# Patient Record
Sex: Female | Born: 1950 | Race: Black or African American | Hispanic: No | Smoking: Former smoker
Health system: Southern US, Community
[De-identification: ages and names within clinical notes are randomized; demographics above are authoritative.]

## PROBLEM LIST (undated history)

## (undated) DIAGNOSIS — I4719 Other supraventricular tachycardia: Secondary | ICD-10-CM

## (undated) DIAGNOSIS — F419 Anxiety disorder, unspecified: Secondary | ICD-10-CM

## (undated) DIAGNOSIS — I471 Supraventricular tachycardia: Secondary | ICD-10-CM

## (undated) DIAGNOSIS — K5732 Diverticulitis of large intestine without perforation or abscess without bleeding: Secondary | ICD-10-CM

## (undated) DIAGNOSIS — E785 Hyperlipidemia, unspecified: Secondary | ICD-10-CM

## (undated) DIAGNOSIS — E039 Hypothyroidism, unspecified: Secondary | ICD-10-CM

## (undated) DIAGNOSIS — Z992 Dependence on renal dialysis: Secondary | ICD-10-CM

## (undated) DIAGNOSIS — I499 Cardiac arrhythmia, unspecified: Secondary | ICD-10-CM

## (undated) DIAGNOSIS — K56609 Unspecified intestinal obstruction, unspecified as to partial versus complete obstruction: Secondary | ICD-10-CM

## (undated) DIAGNOSIS — IMO0001 Reserved for inherently not codable concepts without codable children: Secondary | ICD-10-CM

## (undated) DIAGNOSIS — D649 Anemia, unspecified: Secondary | ICD-10-CM

## (undated) DIAGNOSIS — N051 Unspecified nephritic syndrome with focal and segmental glomerular lesions: Secondary | ICD-10-CM

## (undated) DIAGNOSIS — K922 Gastrointestinal hemorrhage, unspecified: Secondary | ICD-10-CM

## (undated) DIAGNOSIS — E669 Obesity, unspecified: Secondary | ICD-10-CM

## (undated) DIAGNOSIS — E538 Deficiency of other specified B group vitamins: Secondary | ICD-10-CM

## (undated) DIAGNOSIS — G629 Polyneuropathy, unspecified: Secondary | ICD-10-CM

## (undated) DIAGNOSIS — G4730 Sleep apnea, unspecified: Secondary | ICD-10-CM

## (undated) DIAGNOSIS — R42 Dizziness and giddiness: Secondary | ICD-10-CM

## (undated) DIAGNOSIS — E1140 Type 2 diabetes mellitus with diabetic neuropathy, unspecified: Secondary | ICD-10-CM

## (undated) DIAGNOSIS — R600 Localized edema: Secondary | ICD-10-CM

## (undated) DIAGNOSIS — M1990 Unspecified osteoarthritis, unspecified site: Secondary | ICD-10-CM

## (undated) DIAGNOSIS — E119 Type 2 diabetes mellitus without complications: Secondary | ICD-10-CM

## (undated) DIAGNOSIS — K2970 Gastritis, unspecified, without bleeding: Secondary | ICD-10-CM

## (undated) DIAGNOSIS — Z933 Colostomy status: Secondary | ICD-10-CM

## (undated) DIAGNOSIS — I350 Nonrheumatic aortic (valve) stenosis: Secondary | ICD-10-CM

## (undated) DIAGNOSIS — N189 Chronic kidney disease, unspecified: Secondary | ICD-10-CM

## (undated) DIAGNOSIS — I10 Essential (primary) hypertension: Secondary | ICD-10-CM

## (undated) DIAGNOSIS — L409 Psoriasis, unspecified: Secondary | ICD-10-CM

## (undated) DIAGNOSIS — I509 Heart failure, unspecified: Secondary | ICD-10-CM

## (undated) DIAGNOSIS — R011 Cardiac murmur, unspecified: Secondary | ICD-10-CM

## (undated) DIAGNOSIS — A4902 Methicillin resistant Staphylococcus aureus infection, unspecified site: Secondary | ICD-10-CM

## (undated) HISTORY — PX: CARDIAC CATHETERIZATION: SHX172

## (undated) HISTORY — DX: Type 2 diabetes mellitus without complications: E11.9

## (undated) HISTORY — DX: Nonrheumatic aortic (valve) stenosis: I35.0

## (undated) HISTORY — DX: Diverticulitis of large intestine without perforation or abscess without bleeding: K57.32

## (undated) HISTORY — DX: Cardiac arrhythmia, unspecified: I49.9

## (undated) HISTORY — PX: CHOLECYSTECTOMY: SHX55

## (undated) HISTORY — DX: Hyperlipidemia, unspecified: E78.5

## (undated) HISTORY — DX: Heart failure, unspecified: I50.9

## (undated) HISTORY — PX: ABDOMINAL HYSTERECTOMY: SHX81

## (undated) HISTORY — DX: Gastrointestinal hemorrhage, unspecified: K92.2

## (undated) HISTORY — PX: HERNIA REPAIR: SHX51

## (undated) HISTORY — PX: COLECTOMY: SHX59

## (undated) HISTORY — PX: BACK SURGERY: SHX140

## (undated) HISTORY — DX: Essential (primary) hypertension: I10

## (undated) HISTORY — PX: TONSILLECTOMY: SUR1361

## (undated) HISTORY — DX: Obesity, unspecified: E66.9

---

## 2001-12-28 ENCOUNTER — Encounter: Payer: Self-pay | Admitting: Neurosurgery

## 2001-12-30 ENCOUNTER — Encounter: Payer: Self-pay | Admitting: Neurosurgery

## 2001-12-30 ENCOUNTER — Inpatient Hospital Stay (HOSPITAL_COMMUNITY): Admission: RE | Admit: 2001-12-30 | Discharge: 2001-12-31 | Payer: Self-pay | Admitting: Neurosurgery

## 2003-11-21 ENCOUNTER — Ambulatory Visit: Payer: Self-pay | Admitting: Nurse Practitioner

## 2004-04-03 ENCOUNTER — Emergency Department: Payer: Self-pay | Admitting: Emergency Medicine

## 2004-10-19 ENCOUNTER — Ambulatory Visit: Payer: Self-pay | Admitting: Internal Medicine

## 2005-01-21 ENCOUNTER — Emergency Department: Payer: Self-pay | Admitting: Unknown Physician Specialty

## 2006-07-27 ENCOUNTER — Emergency Department: Payer: Self-pay | Admitting: Unknown Physician Specialty

## 2007-04-28 ENCOUNTER — Emergency Department: Payer: Self-pay | Admitting: Emergency Medicine

## 2007-04-28 ENCOUNTER — Other Ambulatory Visit: Payer: Self-pay

## 2007-07-05 ENCOUNTER — Other Ambulatory Visit: Payer: Self-pay

## 2007-07-05 ENCOUNTER — Emergency Department: Payer: Self-pay | Admitting: Emergency Medicine

## 2007-07-11 ENCOUNTER — Emergency Department: Payer: Self-pay | Admitting: Emergency Medicine

## 2007-07-11 ENCOUNTER — Other Ambulatory Visit: Payer: Self-pay

## 2007-09-04 ENCOUNTER — Ambulatory Visit: Payer: Self-pay | Admitting: Internal Medicine

## 2008-03-07 ENCOUNTER — Emergency Department: Payer: Self-pay

## 2008-07-19 ENCOUNTER — Encounter: Payer: Self-pay | Admitting: Internal Medicine

## 2008-08-13 ENCOUNTER — Emergency Department: Payer: Self-pay | Admitting: Emergency Medicine

## 2009-04-11 ENCOUNTER — Ambulatory Visit: Payer: Self-pay | Admitting: Unknown Physician Specialty

## 2009-07-27 ENCOUNTER — Ambulatory Visit: Payer: Self-pay | Admitting: Internal Medicine

## 2010-10-15 DIAGNOSIS — R809 Proteinuria, unspecified: Secondary | ICD-10-CM | POA: Insufficient documentation

## 2010-11-03 ENCOUNTER — Emergency Department: Payer: Self-pay | Admitting: Emergency Medicine

## 2010-12-11 ENCOUNTER — Ambulatory Visit: Payer: Self-pay | Admitting: Otolaryngology

## 2010-12-27 ENCOUNTER — Ambulatory Visit: Payer: Self-pay | Admitting: Otolaryngology

## 2011-02-14 ENCOUNTER — Inpatient Hospital Stay: Payer: Self-pay | Admitting: Surgery

## 2011-07-16 ENCOUNTER — Ambulatory Visit: Payer: Self-pay | Admitting: Internal Medicine

## 2011-11-09 ENCOUNTER — Emergency Department: Payer: Self-pay | Admitting: Emergency Medicine

## 2012-07-17 ENCOUNTER — Ambulatory Visit: Payer: Self-pay | Admitting: Internal Medicine

## 2013-04-05 ENCOUNTER — Ambulatory Visit: Payer: Self-pay | Admitting: Unknown Physician Specialty

## 2013-04-19 ENCOUNTER — Other Ambulatory Visit: Payer: Self-pay | Admitting: Unknown Physician Specialty

## 2013-04-19 DIAGNOSIS — Z800 Family history of malignant neoplasm of digestive organs: Secondary | ICD-10-CM

## 2013-04-28 ENCOUNTER — Emergency Department: Payer: Self-pay | Admitting: Emergency Medicine

## 2013-04-28 LAB — URINALYSIS, COMPLETE
Bacteria: NONE SEEN
Bilirubin,UR: NEGATIVE
Glucose,UR: 50 mg/dL (ref 0–75)
Hyaline Cast: 1
Ketone: NEGATIVE
Leukocyte Esterase: NEGATIVE
Nitrite: NEGATIVE
Ph: 5 (ref 4.5–8.0)
Protein: 100
RBC,UR: 6 /HPF (ref 0–5)
Specific Gravity: 1.009 (ref 1.003–1.030)
Squamous Epithelial: 1
WBC UR: NONE SEEN /HPF (ref 0–5)

## 2013-04-28 LAB — COMPREHENSIVE METABOLIC PANEL
Albumin: 3 g/dL — ABNORMAL LOW (ref 3.4–5.0)
Alkaline Phosphatase: 118 U/L — ABNORMAL HIGH
Anion Gap: 4 — ABNORMAL LOW (ref 7–16)
BUN: 38 mg/dL — ABNORMAL HIGH (ref 7–18)
Bilirubin,Total: 0.2 mg/dL (ref 0.2–1.0)
Calcium, Total: 8.4 mg/dL — ABNORMAL LOW (ref 8.5–10.1)
Chloride: 102 mmol/L (ref 98–107)
Co2: 26 mmol/L (ref 21–32)
Creatinine: 1.92 mg/dL — ABNORMAL HIGH (ref 0.60–1.30)
EGFR (African American): 32 — ABNORMAL LOW
EGFR (Non-African Amer.): 27 — ABNORMAL LOW
Glucose: 235 mg/dL — ABNORMAL HIGH (ref 65–99)
Osmolality: 281 (ref 275–301)
Potassium: 4.7 mmol/L (ref 3.5–5.1)
SGOT(AST): 14 U/L — ABNORMAL LOW (ref 15–37)
SGPT (ALT): 15 U/L (ref 12–78)
Sodium: 132 mmol/L — ABNORMAL LOW (ref 136–145)
Total Protein: 7.3 g/dL (ref 6.4–8.2)

## 2013-04-28 LAB — CBC
HCT: 36.5 % (ref 35.0–47.0)
HGB: 11.7 g/dL — ABNORMAL LOW (ref 12.0–16.0)
MCH: 26.6 pg (ref 26.0–34.0)
MCHC: 32.2 g/dL (ref 32.0–36.0)
MCV: 83 fL (ref 80–100)
Platelet: 277 10*3/uL (ref 150–440)
RBC: 4.41 10*6/uL (ref 3.80–5.20)
RDW: 15 % — ABNORMAL HIGH (ref 11.5–14.5)
WBC: 11.6 10*3/uL — ABNORMAL HIGH (ref 3.6–11.0)

## 2013-04-28 LAB — LIPASE, BLOOD: Lipase: 158 U/L (ref 73–393)

## 2013-05-03 LAB — URINALYSIS, COMPLETE
BACTERIA: NONE SEEN
Bilirubin,UR: NEGATIVE
Glucose,UR: 50 mg/dL (ref 0–75)
KETONE: NEGATIVE
NITRITE: NEGATIVE
Ph: 5 (ref 4.5–8.0)
Protein: >=500
RBC,UR: 8 /HPF (ref 0–5)
SPECIFIC GRAVITY: 1.009 (ref 1.003–1.030)
WBC UR: 8 /HPF (ref 0–5)

## 2013-05-03 LAB — COMPREHENSIVE METABOLIC PANEL
AST: 21 U/L (ref 15–37)
Albumin: 2.9 g/dL — ABNORMAL LOW (ref 3.4–5.0)
Alkaline Phosphatase: 104 U/L
Anion Gap: 6 — ABNORMAL LOW (ref 7–16)
BUN: 16 mg/dL (ref 7–18)
Bilirubin,Total: 0.2 mg/dL (ref 0.2–1.0)
CHLORIDE: 107 mmol/L (ref 98–107)
Calcium, Total: 8.1 mg/dL — ABNORMAL LOW (ref 8.5–10.1)
Co2: 24 mmol/L (ref 21–32)
Creatinine: 1.7 mg/dL — ABNORMAL HIGH (ref 0.60–1.30)
EGFR (Non-African Amer.): 32 — ABNORMAL LOW
GFR CALC AF AMER: 37 — AB
GLUCOSE: 157 mg/dL — AB (ref 65–99)
OSMOLALITY: 278 (ref 275–301)
Potassium: 3.8 mmol/L (ref 3.5–5.1)
SGPT (ALT): 22 U/L (ref 12–78)
Sodium: 137 mmol/L (ref 136–145)
TOTAL PROTEIN: 7.3 g/dL (ref 6.4–8.2)

## 2013-05-03 LAB — CBC WITH DIFFERENTIAL/PLATELET
Basophil #: 0.1 10*3/uL (ref 0.0–0.1)
Basophil %: 0.8 %
Eosinophil #: 0.1 10*3/uL (ref 0.0–0.7)
Eosinophil %: 1.4 %
HCT: 37.1 % (ref 35.0–47.0)
HGB: 11.6 g/dL — ABNORMAL LOW (ref 12.0–16.0)
Lymphocyte #: 1.6 10*3/uL (ref 1.0–3.6)
Lymphocyte %: 17 %
MCH: 26.2 pg (ref 26.0–34.0)
MCHC: 31.3 g/dL — ABNORMAL LOW (ref 32.0–36.0)
MCV: 84 fL (ref 80–100)
Monocyte #: 0.7 x10 3/mm (ref 0.2–0.9)
Monocyte %: 7.8 %
Neutrophil #: 6.9 10*3/uL — ABNORMAL HIGH (ref 1.4–6.5)
Neutrophil %: 73 %
Platelet: 301 10*3/uL (ref 150–440)
RBC: 4.42 10*6/uL (ref 3.80–5.20)
RDW: 14.7 % — ABNORMAL HIGH (ref 11.5–14.5)
WBC: 9.4 10*3/uL (ref 3.6–11.0)

## 2013-05-03 LAB — LIPASE, BLOOD: LIPASE: 133 U/L (ref 73–393)

## 2013-05-03 LAB — TROPONIN I: Troponin-I: <0.02 ng/mL

## 2013-05-04 ENCOUNTER — Other Ambulatory Visit: Payer: Self-pay

## 2013-05-04 LAB — SEDIMENTATION RATE: ERYTHROCYTE SED RATE: 46 mm/h — AB (ref 0–30)

## 2013-05-04 LAB — TSH: THYROID STIMULATING HORM: 7.31 u[IU]/mL — AB

## 2013-05-05 ENCOUNTER — Inpatient Hospital Stay: Payer: Self-pay | Admitting: Internal Medicine

## 2013-05-05 LAB — BASIC METABOLIC PANEL
Anion Gap: 4 — ABNORMAL LOW (ref 7–16)
BUN: 16 mg/dL (ref 7–18)
CO2: 26 mmol/L (ref 21–32)
Calcium, Total: 7.7 mg/dL — ABNORMAL LOW (ref 8.5–10.1)
Chloride: 107 mmol/L (ref 98–107)
Creatinine: 1.9 mg/dL — ABNORMAL HIGH (ref 0.60–1.30)
EGFR (Non-African Amer.): 28 — ABNORMAL LOW
GFR CALC AF AMER: 32 — AB
GLUCOSE: 89 mg/dL (ref 65–99)
Osmolality: 274 (ref 275–301)
Potassium: 4.4 mmol/L (ref 3.5–5.1)
SODIUM: 137 mmol/L (ref 136–145)

## 2013-05-05 LAB — CBC WITH DIFFERENTIAL/PLATELET
Basophil #: 0 10*3/uL (ref 0.0–0.1)
Basophil %: 0.5 %
Eosinophil #: 0.2 10*3/uL (ref 0.0–0.7)
Eosinophil %: 2.1 %
HCT: 31.8 % — ABNORMAL LOW (ref 35.0–47.0)
HGB: 10.1 g/dL — ABNORMAL LOW (ref 12.0–16.0)
Lymphocyte #: 1.6 10*3/uL (ref 1.0–3.6)
Lymphocyte %: 21.3 %
MCH: 26.3 pg (ref 26.0–34.0)
MCHC: 31.8 g/dL — ABNORMAL LOW (ref 32.0–36.0)
MCV: 83 fL (ref 80–100)
Monocyte #: 0.7 x10 3/mm (ref 0.2–0.9)
Monocyte %: 9.5 %
NEUTROS PCT: 66.6 %
Neutrophil #: 4.9 10*3/uL (ref 1.4–6.5)
Platelet: 259 10*3/uL (ref 150–440)
RBC: 3.84 10*6/uL (ref 3.80–5.20)
RDW: 14.4 % (ref 11.5–14.5)
WBC: 7.3 10*3/uL (ref 3.6–11.0)

## 2013-05-06 LAB — CREATININE, SERUM
Creatinine: 2.19 mg/dL — ABNORMAL HIGH (ref 0.60–1.30)
EGFR (African American): 27 — ABNORMAL LOW
EGFR (Non-African Amer.): 23 — ABNORMAL LOW

## 2013-05-06 LAB — HEMATOCRIT: HCT: 31.8 % — AB (ref 35.0–47.0)

## 2013-05-07 ENCOUNTER — Other Ambulatory Visit: Payer: Self-pay | Admitting: Unknown Physician Specialty

## 2013-05-07 DIAGNOSIS — Q438 Other specified congenital malformations of intestine: Secondary | ICD-10-CM

## 2013-05-07 LAB — BASIC METABOLIC PANEL
Anion Gap: 4 — ABNORMAL LOW (ref 7–16)
BUN: 15 mg/dL (ref 7–18)
CO2: 27 mmol/L (ref 21–32)
Calcium, Total: 8 mg/dL — ABNORMAL LOW (ref 8.5–10.1)
Chloride: 109 mmol/L — ABNORMAL HIGH (ref 98–107)
Creatinine: 2.03 mg/dL — ABNORMAL HIGH (ref 0.60–1.30)
EGFR (African American): 29 — ABNORMAL LOW
GFR CALC NON AF AMER: 25 — AB
Glucose: 87 mg/dL (ref 65–99)
Osmolality: 280 (ref 275–301)
Potassium: 4.3 mmol/L (ref 3.5–5.1)
Sodium: 140 mmol/L (ref 136–145)

## 2013-05-08 LAB — URINALYSIS, COMPLETE
BILIRUBIN, UR: NEGATIVE
Glucose,UR: 50 mg/dL (ref 0–75)
Ketone: NEGATIVE
NITRITE: NEGATIVE
PH: 6 (ref 4.5–8.0)
Specific Gravity: 1.006 (ref 1.003–1.030)
Squamous Epithelial: 2

## 2013-05-08 LAB — CBC WITH DIFFERENTIAL/PLATELET
BASOS PCT: 0.6 %
Basophil #: 0.1 10*3/uL (ref 0.0–0.1)
EOS ABS: 0.1 10*3/uL (ref 0.0–0.7)
Eosinophil %: 1.2 %
HCT: 34.6 % — ABNORMAL LOW (ref 35.0–47.0)
HGB: 11.5 g/dL — ABNORMAL LOW (ref 12.0–16.0)
LYMPHS PCT: 16.6 %
Lymphocyte #: 1.3 10*3/uL (ref 1.0–3.6)
MCH: 27.6 pg (ref 26.0–34.0)
MCHC: 33.2 g/dL (ref 32.0–36.0)
MCV: 83 fL (ref 80–100)
MONO ABS: 0.4 x10 3/mm (ref 0.2–0.9)
Monocyte %: 4.7 %
Neutrophil #: 6.3 10*3/uL (ref 1.4–6.5)
Neutrophil %: 76.9 %
PLATELETS: 318 10*3/uL (ref 150–440)
RBC: 4.18 10*6/uL (ref 3.80–5.20)
RDW: 14.8 % — ABNORMAL HIGH (ref 11.5–14.5)
WBC: 8.1 10*3/uL (ref 3.6–11.0)

## 2013-05-08 LAB — COMPREHENSIVE METABOLIC PANEL
ALK PHOS: 111 U/L
Albumin: 2.7 g/dL — ABNORMAL LOW (ref 3.4–5.0)
Anion Gap: 5 — ABNORMAL LOW (ref 7–16)
BUN: 16 mg/dL (ref 7–18)
Bilirubin,Total: 0.3 mg/dL (ref 0.2–1.0)
CREATININE: 1.91 mg/dL — AB (ref 0.60–1.30)
Calcium, Total: 8.5 mg/dL (ref 8.5–10.1)
Chloride: 107 mmol/L (ref 98–107)
Co2: 28 mmol/L (ref 21–32)
EGFR (African American): 32 — ABNORMAL LOW
EGFR (Non-African Amer.): 27 — ABNORMAL LOW
Glucose: 187 mg/dL — ABNORMAL HIGH (ref 65–99)
Osmolality: 286 (ref 275–301)
POTASSIUM: 4 mmol/L (ref 3.5–5.1)
SGOT(AST): 13 U/L — ABNORMAL LOW (ref 15–37)
SGPT (ALT): 20 U/L (ref 12–78)
Sodium: 140 mmol/L (ref 136–145)
TOTAL PROTEIN: 7.2 g/dL (ref 6.4–8.2)

## 2013-05-09 ENCOUNTER — Inpatient Hospital Stay: Payer: Self-pay | Admitting: Surgery

## 2013-05-09 HISTORY — PX: OTHER SURGICAL HISTORY: SHX169

## 2013-05-09 LAB — CBC WITH DIFFERENTIAL/PLATELET
Basophil #: 0 10*3/uL (ref 0.0–0.1)
Basophil %: 0.3 %
EOS PCT: 0 %
Eosinophil #: 0 10*3/uL (ref 0.0–0.7)
HCT: 32.3 % — ABNORMAL LOW (ref 35.0–47.0)
HGB: 9.8 g/dL — AB (ref 12.0–16.0)
Lymphocyte #: 1.1 10*3/uL (ref 1.0–3.6)
Lymphocyte %: 8 %
MCH: 25.8 pg — ABNORMAL LOW (ref 26.0–34.0)
MCHC: 30.5 g/dL — ABNORMAL LOW (ref 32.0–36.0)
MCV: 85 fL (ref 80–100)
MONO ABS: 1.1 x10 3/mm — AB (ref 0.2–0.9)
Monocyte %: 8.1 %
Neutrophil #: 11.1 10*3/uL — ABNORMAL HIGH (ref 1.4–6.5)
Neutrophil %: 83.6 %
PLATELETS: 317 10*3/uL (ref 150–440)
RBC: 3.82 10*6/uL (ref 3.80–5.20)
RDW: 15.3 % — ABNORMAL HIGH (ref 11.5–14.5)
WBC: 13.3 10*3/uL — ABNORMAL HIGH (ref 3.6–11.0)

## 2013-05-09 LAB — BASIC METABOLIC PANEL
ANION GAP: 2 — AB (ref 7–16)
BUN: 18 mg/dL (ref 7–18)
Calcium, Total: 7.1 mg/dL — ABNORMAL LOW (ref 8.5–10.1)
Chloride: 111 mmol/L — ABNORMAL HIGH (ref 98–107)
Co2: 26 mmol/L (ref 21–32)
Creatinine: 2.17 mg/dL — ABNORMAL HIGH (ref 0.60–1.30)
EGFR (African American): 27 — ABNORMAL LOW
EGFR (Non-African Amer.): 23 — ABNORMAL LOW
Glucose: 156 mg/dL — ABNORMAL HIGH (ref 65–99)
Osmolality: 283 (ref 275–301)
Potassium: 5.3 mmol/L — ABNORMAL HIGH (ref 3.5–5.1)
Sodium: 139 mmol/L (ref 136–145)

## 2013-05-10 LAB — BASIC METABOLIC PANEL
ANION GAP: 2 — AB (ref 7–16)
BUN: 20 mg/dL — ABNORMAL HIGH (ref 7–18)
CO2: 25 mmol/L (ref 21–32)
Calcium, Total: 6.7 mg/dL — CL (ref 8.5–10.1)
Chloride: 112 mmol/L — ABNORMAL HIGH (ref 98–107)
Creatinine: 2.4 mg/dL — ABNORMAL HIGH (ref 0.60–1.30)
EGFR (African American): 24 — ABNORMAL LOW
GFR CALC NON AF AMER: 21 — AB
Glucose: 129 mg/dL — ABNORMAL HIGH (ref 65–99)
Osmolality: 282 (ref 275–301)
POTASSIUM: 5 mmol/L (ref 3.5–5.1)
SODIUM: 139 mmol/L (ref 136–145)

## 2013-05-10 LAB — CBC WITH DIFFERENTIAL/PLATELET
BASOS PCT: 0.5 %
Basophil #: 0.1 10*3/uL (ref 0.0–0.1)
Eosinophil #: 0.1 10*3/uL (ref 0.0–0.7)
Eosinophil %: 0.7 %
HCT: 27.2 % — AB (ref 35.0–47.0)
HGB: 8.5 g/dL — ABNORMAL LOW (ref 12.0–16.0)
Lymphocyte #: 1.1 10*3/uL (ref 1.0–3.6)
Lymphocyte %: 9.5 %
MCH: 26.4 pg (ref 26.0–34.0)
MCHC: 31.4 g/dL — AB (ref 32.0–36.0)
MCV: 84 fL (ref 80–100)
MONO ABS: 1.1 x10 3/mm — AB (ref 0.2–0.9)
Monocyte %: 9.9 %
NEUTROS ABS: 8.8 10*3/uL — AB (ref 1.4–6.5)
Neutrophil %: 79.4 %
Platelet: 276 10*3/uL (ref 150–440)
RBC: 3.23 10*6/uL — ABNORMAL LOW (ref 3.80–5.20)
RDW: 15.2 % — ABNORMAL HIGH (ref 11.5–14.5)
WBC: 11.1 10*3/uL — ABNORMAL HIGH (ref 3.6–11.0)

## 2013-05-11 LAB — BASIC METABOLIC PANEL
Anion Gap: 6 — ABNORMAL LOW (ref 7–16)
BUN: 21 mg/dL — ABNORMAL HIGH (ref 7–18)
CHLORIDE: 111 mmol/L — AB (ref 98–107)
CO2: 23 mmol/L (ref 21–32)
CREATININE: 2.32 mg/dL — AB (ref 0.60–1.30)
Calcium, Total: 6.7 mg/dL — CL (ref 8.5–10.1)
EGFR (African American): 25 — ABNORMAL LOW
EGFR (Non-African Amer.): 22 — ABNORMAL LOW
Glucose: 72 mg/dL (ref 65–99)
Osmolality: 281 (ref 275–301)
Potassium: 4.6 mmol/L (ref 3.5–5.1)
Sodium: 140 mmol/L (ref 136–145)

## 2013-05-11 LAB — CBC WITH DIFFERENTIAL/PLATELET
Basophil #: 0 10*3/uL (ref 0.0–0.1)
Basophil %: 0.2 %
EOS ABS: 0.4 10*3/uL (ref 0.0–0.7)
EOS PCT: 3.3 %
HCT: 25.6 % — ABNORMAL LOW (ref 35.0–47.0)
HGB: 8.1 g/dL — ABNORMAL LOW (ref 12.0–16.0)
Lymphocyte #: 1.1 10*3/uL (ref 1.0–3.6)
Lymphocyte %: 8.6 %
MCH: 26.7 pg (ref 26.0–34.0)
MCHC: 31.9 g/dL — AB (ref 32.0–36.0)
MCV: 84 fL (ref 80–100)
Monocyte #: 1 x10 3/mm — ABNORMAL HIGH (ref 0.2–0.9)
Monocyte %: 8.1 %
NEUTROS ABS: 10 10*3/uL — AB (ref 1.4–6.5)
NEUTROS PCT: 79.8 %
Platelet: 273 10*3/uL (ref 150–440)
RBC: 3.05 10*6/uL — ABNORMAL LOW (ref 3.80–5.20)
RDW: 14.9 % — ABNORMAL HIGH (ref 11.5–14.5)
WBC: 12.5 10*3/uL — ABNORMAL HIGH (ref 3.6–11.0)

## 2013-05-11 LAB — ALBUMIN: ALBUMIN: 1.5 g/dL — AB (ref 3.4–5.0)

## 2013-05-12 ENCOUNTER — Inpatient Hospital Stay: Admission: RE | Admit: 2013-05-12 | Payer: Self-pay | Source: Ambulatory Visit

## 2013-05-12 LAB — CBC WITH DIFFERENTIAL/PLATELET
Basophil #: 0 10*3/uL (ref 0.0–0.1)
Basophil %: 0.3 %
Eosinophil #: 0.5 10*3/uL (ref 0.0–0.7)
Eosinophil %: 3.5 %
HCT: 23.1 % — AB (ref 35.0–47.0)
HGB: 7.3 g/dL — AB (ref 12.0–16.0)
LYMPHS PCT: 9.7 %
Lymphocyte #: 1.2 10*3/uL (ref 1.0–3.6)
MCH: 26.3 pg (ref 26.0–34.0)
MCHC: 31.5 g/dL — ABNORMAL LOW (ref 32.0–36.0)
MCV: 84 fL (ref 80–100)
MONO ABS: 1.1 x10 3/mm — AB (ref 0.2–0.9)
Monocyte %: 8.6 %
NEUTROS ABS: 10 10*3/uL — AB (ref 1.4–6.5)
Neutrophil %: 77.9 %
PLATELETS: 305 10*3/uL (ref 150–440)
RBC: 2.76 10*6/uL — AB (ref 3.80–5.20)
RDW: 14.9 % — ABNORMAL HIGH (ref 11.5–14.5)
WBC: 12.8 10*3/uL — ABNORMAL HIGH (ref 3.6–11.0)

## 2013-05-12 LAB — BASIC METABOLIC PANEL
Anion Gap: 5 — ABNORMAL LOW (ref 7–16)
BUN: 25 mg/dL — ABNORMAL HIGH (ref 7–18)
CHLORIDE: 110 mmol/L — AB (ref 98–107)
Calcium, Total: 6.9 mg/dL — CL (ref 8.5–10.1)
Co2: 23 mmol/L (ref 21–32)
Creatinine: 2.67 mg/dL — ABNORMAL HIGH (ref 0.60–1.30)
EGFR (African American): 21 — ABNORMAL LOW
EGFR (Non-African Amer.): 18 — ABNORMAL LOW
GLUCOSE: 93 mg/dL (ref 65–99)
OSMOLALITY: 280 (ref 275–301)
Potassium: 4.4 mmol/L (ref 3.5–5.1)
SODIUM: 138 mmol/L (ref 136–145)

## 2013-05-12 LAB — PATHOLOGY REPORT

## 2013-05-13 LAB — BASIC METABOLIC PANEL
Anion Gap: 6 — ABNORMAL LOW (ref 7–16)
BUN: 25 mg/dL — ABNORMAL HIGH (ref 7–18)
CALCIUM: 7.3 mg/dL — AB (ref 8.5–10.1)
CREATININE: 2.52 mg/dL — AB (ref 0.60–1.30)
Chloride: 110 mmol/L — ABNORMAL HIGH (ref 98–107)
Co2: 23 mmol/L (ref 21–32)
EGFR (Non-African Amer.): 20 — ABNORMAL LOW
GFR CALC AF AMER: 23 — AB
Glucose: 127 mg/dL — ABNORMAL HIGH (ref 65–99)
Osmolality: 284 (ref 275–301)
POTASSIUM: 3.8 mmol/L (ref 3.5–5.1)
SODIUM: 139 mmol/L (ref 136–145)

## 2013-05-13 LAB — CBC WITH DIFFERENTIAL/PLATELET
Basophil #: 0 10*3/uL (ref 0.0–0.1)
Basophil %: 0.2 %
Eosinophil #: 0.5 10*3/uL (ref 0.0–0.7)
Eosinophil %: 5 %
HCT: 24.3 % — ABNORMAL LOW (ref 35.0–47.0)
HGB: 7.5 g/dL — AB (ref 12.0–16.0)
Lymphocyte #: 1.1 10*3/uL (ref 1.0–3.6)
Lymphocyte %: 10.2 %
MCH: 25.5 pg — AB (ref 26.0–34.0)
MCHC: 30.7 g/dL — ABNORMAL LOW (ref 32.0–36.0)
MCV: 83 fL (ref 80–100)
MONO ABS: 0.9 x10 3/mm (ref 0.2–0.9)
Monocyte %: 8.8 %
NEUTROS ABS: 8 10*3/uL — AB (ref 1.4–6.5)
Neutrophil %: 75.8 %
Platelet: 269 10*3/uL (ref 150–440)
RBC: 2.92 10*6/uL — ABNORMAL LOW (ref 3.80–5.20)
RDW: 15.5 % — AB (ref 11.5–14.5)
WBC: 10.5 10*3/uL (ref 3.6–11.0)

## 2013-05-14 LAB — CREATININE, SERUM
CREATININE: 2.25 mg/dL — AB (ref 0.60–1.30)
GFR CALC AF AMER: 26 — AB
GFR CALC NON AF AMER: 22 — AB

## 2013-05-14 LAB — HEMOGLOBIN: HGB: 8.5 g/dL — ABNORMAL LOW (ref 12.0–16.0)

## 2013-05-16 LAB — COMPREHENSIVE METABOLIC PANEL
ALK PHOS: 69 U/L
Albumin: 1.7 g/dL — ABNORMAL LOW (ref 3.4–5.0)
Anion Gap: 5 — ABNORMAL LOW (ref 7–16)
BUN: 17 mg/dL (ref 7–18)
Bilirubin,Total: 0.2 mg/dL (ref 0.2–1.0)
CALCIUM: 7.5 mg/dL — AB (ref 8.5–10.1)
Chloride: 113 mmol/L — ABNORMAL HIGH (ref 98–107)
Co2: 24 mmol/L (ref 21–32)
Creatinine: 1.69 mg/dL — ABNORMAL HIGH (ref 0.60–1.30)
EGFR (Non-African Amer.): 32 — ABNORMAL LOW
GFR CALC AF AMER: 37 — AB
Glucose: 100 mg/dL — ABNORMAL HIGH (ref 65–99)
OSMOLALITY: 285 (ref 275–301)
POTASSIUM: 3.6 mmol/L (ref 3.5–5.1)
SGOT(AST): 6 U/L — ABNORMAL LOW (ref 15–37)
SGPT (ALT): 6 U/L — ABNORMAL LOW (ref 12–78)
Sodium: 142 mmol/L (ref 136–145)
Total Protein: 5.7 g/dL — ABNORMAL LOW (ref 6.4–8.2)

## 2013-05-16 LAB — CBC WITH DIFFERENTIAL/PLATELET
BASOS PCT: 0.6 %
Basophil #: 0.1 10*3/uL (ref 0.0–0.1)
EOS ABS: 0.5 10*3/uL (ref 0.0–0.7)
Eosinophil %: 4.3 %
HCT: 27.1 % — AB (ref 35.0–47.0)
HGB: 8.5 g/dL — ABNORMAL LOW (ref 12.0–16.0)
LYMPHS PCT: 14 %
Lymphocyte #: 1.5 10*3/uL (ref 1.0–3.6)
MCH: 26.2 pg (ref 26.0–34.0)
MCHC: 31.5 g/dL — AB (ref 32.0–36.0)
MCV: 83 fL (ref 80–100)
MONO ABS: 0.9 x10 3/mm (ref 0.2–0.9)
MONOS PCT: 8.4 %
NEUTROS ABS: 8 10*3/uL — AB (ref 1.4–6.5)
Neutrophil %: 72.7 %
Platelet: 372 10*3/uL (ref 150–440)
RBC: 3.25 10*6/uL — ABNORMAL LOW (ref 3.80–5.20)
RDW: 15.4 % — ABNORMAL HIGH (ref 11.5–14.5)
WBC: 11 10*3/uL (ref 3.6–11.0)

## 2013-05-17 ENCOUNTER — Encounter: Payer: Self-pay | Admitting: Internal Medicine

## 2013-05-17 LAB — BASIC METABOLIC PANEL
Anion Gap: 6 — ABNORMAL LOW (ref 7–16)
BUN: 15 mg/dL (ref 7–18)
Calcium, Total: 7.6 mg/dL — ABNORMAL LOW (ref 8.5–10.1)
Chloride: 110 mmol/L — ABNORMAL HIGH (ref 98–107)
Co2: 25 mmol/L (ref 21–32)
Creatinine: 1.58 mg/dL — ABNORMAL HIGH (ref 0.60–1.30)
GFR CALC AF AMER: 40 — AB
GFR CALC NON AF AMER: 34 — AB
Glucose: 116 mg/dL — ABNORMAL HIGH (ref 65–99)
Osmolality: 283 (ref 275–301)
Potassium: 3.4 mmol/L — ABNORMAL LOW (ref 3.5–5.1)
Sodium: 141 mmol/L (ref 136–145)

## 2013-05-17 LAB — HEMOGLOBIN: HGB: 8.9 g/dL — ABNORMAL LOW (ref 12.0–16.0)

## 2013-05-19 ENCOUNTER — Encounter: Payer: Self-pay | Admitting: Internal Medicine

## 2013-06-17 ENCOUNTER — Ambulatory Visit: Payer: Self-pay | Admitting: Surgery

## 2013-06-17 LAB — PLATELET COUNT: PLATELETS: 313 10*3/uL (ref 150–440)

## 2013-07-09 ENCOUNTER — Inpatient Hospital Stay: Payer: Self-pay | Admitting: Surgery

## 2013-07-09 LAB — COMPREHENSIVE METABOLIC PANEL
ALBUMIN: 2.6 g/dL — AB (ref 3.4–5.0)
ALT: 9 U/L — AB (ref 12–78)
ANION GAP: 9 (ref 7–16)
Alkaline Phosphatase: 90 U/L
BILIRUBIN TOTAL: 0.3 mg/dL (ref 0.2–1.0)
BUN: 14 mg/dL (ref 7–18)
CALCIUM: 8.5 mg/dL (ref 8.5–10.1)
CO2: 25 mmol/L (ref 21–32)
Chloride: 106 mmol/L (ref 98–107)
Creatinine: 1.41 mg/dL — ABNORMAL HIGH (ref 0.60–1.30)
EGFR (African American): 46 — ABNORMAL LOW
EGFR (Non-African Amer.): 40 — ABNORMAL LOW
GLUCOSE: 97 mg/dL (ref 65–99)
Osmolality: 280 (ref 275–301)
POTASSIUM: 4.1 mmol/L (ref 3.5–5.1)
SGOT(AST): 9 U/L — ABNORMAL LOW (ref 15–37)
SODIUM: 140 mmol/L (ref 136–145)
Total Protein: 7.4 g/dL (ref 6.4–8.2)

## 2013-07-09 LAB — CBC
HCT: 32.6 % — AB (ref 35.0–47.0)
HGB: 10.2 g/dL — ABNORMAL LOW (ref 12.0–16.0)
MCH: 25.6 pg — AB (ref 26.0–34.0)
MCHC: 31.3 g/dL — ABNORMAL LOW (ref 32.0–36.0)
MCV: 82 fL (ref 80–100)
PLATELETS: 343 10*3/uL (ref 150–440)
RBC: 3.98 10*6/uL (ref 3.80–5.20)
RDW: 16.4 % — ABNORMAL HIGH (ref 11.5–14.5)
WBC: 8.5 10*3/uL (ref 3.6–11.0)

## 2013-07-09 LAB — LIPASE, BLOOD: LIPASE: 74 U/L (ref 73–393)

## 2013-07-10 LAB — CBC WITH DIFFERENTIAL/PLATELET
Basophil #: 0 10*3/uL (ref 0.0–0.1)
Basophil #: 0 10*3/uL (ref 0.0–0.1)
Basophil %: 0.5 %
Basophil %: 0.5 %
EOS PCT: 1.2 %
Eosinophil #: 0.1 10*3/uL (ref 0.0–0.7)
Eosinophil #: 0.1 10*3/uL (ref 0.0–0.7)
Eosinophil %: 1.6 %
HCT: 24.6 % — ABNORMAL LOW (ref 35.0–47.0)
HCT: 27.4 % — ABNORMAL LOW (ref 35.0–47.0)
HGB: 7.5 g/dL — ABNORMAL LOW (ref 12.0–16.0)
HGB: 8.4 g/dL — AB (ref 12.0–16.0)
LYMPHS ABS: 1.4 10*3/uL (ref 1.0–3.6)
LYMPHS PCT: 15.8 %
Lymphocyte #: 1.2 10*3/uL (ref 1.0–3.6)
Lymphocyte %: 16.1 %
MCH: 25.2 pg — AB (ref 26.0–34.0)
MCH: 25.2 pg — ABNORMAL LOW (ref 26.0–34.0)
MCHC: 30.4 g/dL — AB (ref 32.0–36.0)
MCHC: 30.5 g/dL — ABNORMAL LOW (ref 32.0–36.0)
MCV: 83 fL (ref 80–100)
MCV: 83 fL (ref 80–100)
MONO ABS: 0.6 x10 3/mm (ref 0.2–0.9)
MONO ABS: 0.6 x10 3/mm (ref 0.2–0.9)
MONOS PCT: 6.7 %
Monocyte %: 7.8 %
NEUTROS ABS: 6.8 10*3/uL — AB (ref 1.4–6.5)
Neutrophil #: 5.7 10*3/uL (ref 1.4–6.5)
Neutrophil %: 74 %
Neutrophil %: 75.8 %
Platelet: 292 10*3/uL (ref 150–440)
Platelet: 314 10*3/uL (ref 150–440)
RBC: 2.96 10*6/uL — ABNORMAL LOW (ref 3.80–5.20)
RBC: 3.31 10*6/uL — AB (ref 3.80–5.20)
RDW: 16.5 % — ABNORMAL HIGH (ref 11.5–14.5)
RDW: 16.7 % — AB (ref 11.5–14.5)
WBC: 7.6 10*3/uL (ref 3.6–11.0)
WBC: 9 10*3/uL (ref 3.6–11.0)

## 2013-07-10 LAB — CLOSTRIDIUM DIFFICILE(ARMC)

## 2013-07-11 LAB — CBC WITH DIFFERENTIAL/PLATELET
BASOS ABS: 0 10*3/uL (ref 0.0–0.1)
Basophil %: 0.7 %
EOS PCT: 3.5 %
Eosinophil #: 0.2 10*3/uL (ref 0.0–0.7)
HCT: 21.7 % — AB (ref 35.0–47.0)
HGB: 6.6 g/dL — ABNORMAL LOW (ref 12.0–16.0)
LYMPHS ABS: 1.6 10*3/uL (ref 1.0–3.6)
LYMPHS PCT: 24.6 %
MCH: 25.1 pg — ABNORMAL LOW (ref 26.0–34.0)
MCHC: 30.2 g/dL — ABNORMAL LOW (ref 32.0–36.0)
MCV: 83 fL (ref 80–100)
MONO ABS: 0.6 x10 3/mm (ref 0.2–0.9)
MONOS PCT: 8.8 %
Neutrophil #: 4 10*3/uL (ref 1.4–6.5)
Neutrophil %: 62.4 %
Platelet: 274 10*3/uL (ref 150–440)
RBC: 2.62 10*6/uL — ABNORMAL LOW (ref 3.80–5.20)
RDW: 17.1 % — AB (ref 11.5–14.5)
WBC: 6.3 10*3/uL (ref 3.6–11.0)

## 2013-07-11 LAB — HEMOGLOBIN: HGB: 7.6 g/dL — ABNORMAL LOW (ref 12.0–16.0)

## 2013-07-12 LAB — CBC WITH DIFFERENTIAL/PLATELET
Basophil #: 0 10*3/uL (ref 0.0–0.1)
Basophil %: 0.6 %
EOS ABS: 0.3 10*3/uL (ref 0.0–0.7)
Eosinophil %: 3.4 %
HCT: 25.2 % — ABNORMAL LOW (ref 35.0–47.0)
HGB: 8.2 g/dL — ABNORMAL LOW (ref 12.0–16.0)
Lymphocyte #: 1.5 10*3/uL (ref 1.0–3.6)
Lymphocyte %: 19.8 %
MCH: 27.4 pg (ref 26.0–34.0)
MCHC: 32.4 g/dL (ref 32.0–36.0)
MCV: 84 fL (ref 80–100)
Monocyte #: 0.6 x10 3/mm (ref 0.2–0.9)
Monocyte %: 7.6 %
NEUTROS ABS: 5.1 10*3/uL (ref 1.4–6.5)
Neutrophil %: 68.6 %
Platelet: 227 10*3/uL (ref 150–440)
RBC: 2.99 10*6/uL — ABNORMAL LOW (ref 3.80–5.20)
RDW: 16.1 % — AB (ref 11.5–14.5)
WBC: 7.4 10*3/uL (ref 3.6–11.0)

## 2013-07-12 LAB — HEMOGLOBIN
HGB: 7.2 g/dL — ABNORMAL LOW (ref 12.0–16.0)
HGB: 6.8 g/dL — ABNORMAL LOW (ref 12.0–16.0)

## 2013-07-13 LAB — COMPREHENSIVE METABOLIC PANEL
ALK PHOS: 57 U/L
Albumin: 2 g/dL — ABNORMAL LOW (ref 3.4–5.0)
Anion Gap: 7 (ref 7–16)
BUN: 6 mg/dL — AB (ref 7–18)
Bilirubin,Total: 0.4 mg/dL (ref 0.2–1.0)
CO2: 26 mmol/L (ref 21–32)
CREATININE: 1.69 mg/dL — AB (ref 0.60–1.30)
Calcium, Total: 7.6 mg/dL — ABNORMAL LOW (ref 8.5–10.1)
Chloride: 109 mmol/L — ABNORMAL HIGH (ref 98–107)
EGFR (African American): 37 — ABNORMAL LOW
EGFR (Non-African Amer.): 32 — ABNORMAL LOW
GLUCOSE: 122 mg/dL — AB (ref 65–99)
Osmolality: 282 (ref 275–301)
POTASSIUM: 3.8 mmol/L (ref 3.5–5.1)
SGOT(AST): 7 U/L — ABNORMAL LOW (ref 15–37)
SGPT (ALT): 7 U/L — ABNORMAL LOW (ref 12–78)
Sodium: 142 mmol/L (ref 136–145)
Total Protein: 4.8 g/dL — ABNORMAL LOW (ref 6.4–8.2)

## 2013-07-13 LAB — CBC WITH DIFFERENTIAL/PLATELET
Basophil #: 0 10*3/uL (ref 0.0–0.1)
Basophil %: 0.6 %
EOS PCT: 1.8 %
Eosinophil #: 0.1 10*3/uL (ref 0.0–0.7)
HCT: 22.4 % — ABNORMAL LOW (ref 35.0–47.0)
HGB: 7.3 g/dL — AB (ref 12.0–16.0)
LYMPHS PCT: 17.5 %
Lymphocyte #: 1.3 10*3/uL (ref 1.0–3.6)
MCH: 27.8 pg (ref 26.0–34.0)
MCHC: 32.8 g/dL (ref 32.0–36.0)
MCV: 85 fL (ref 80–100)
Monocyte #: 0.5 x10 3/mm (ref 0.2–0.9)
Monocyte %: 7.1 %
NEUTROS PCT: 73 %
Neutrophil #: 5.6 10*3/uL (ref 1.4–6.5)
PLATELETS: 200 10*3/uL (ref 150–440)
RBC: 2.63 10*6/uL — AB (ref 3.80–5.20)
RDW: 15.9 % — ABNORMAL HIGH (ref 11.5–14.5)
WBC: 7.6 10*3/uL (ref 3.6–11.0)

## 2013-07-13 LAB — HEMOGLOBIN: HGB: 8.3 g/dL — AB (ref 12.0–16.0)

## 2013-07-14 LAB — CBC WITH DIFFERENTIAL/PLATELET
BASOS ABS: 0 10*3/uL (ref 0.0–0.1)
Basophil #: 0.1 10*3/uL (ref 0.0–0.1)
Basophil %: 0.4 %
Basophil %: 1 %
EOS ABS: 0.3 10*3/uL (ref 0.0–0.7)
Eosinophil #: 0.3 10*3/uL (ref 0.0–0.7)
Eosinophil %: 3.3 %
Eosinophil %: 3.5 %
HCT: 21.3 % — AB (ref 35.0–47.0)
HCT: 25.9 % — ABNORMAL LOW (ref 35.0–47.0)
HGB: 6.9 g/dL — ABNORMAL LOW (ref 12.0–16.0)
HGB: 8.4 g/dL — ABNORMAL LOW (ref 12.0–16.0)
LYMPHS ABS: 1.2 10*3/uL (ref 1.0–3.6)
LYMPHS PCT: 15.9 %
Lymphocyte #: 1.3 10*3/uL (ref 1.0–3.6)
Lymphocyte %: 14.8 %
MCH: 27.7 pg (ref 26.0–34.0)
MCH: 28 pg (ref 26.0–34.0)
MCHC: 32.3 g/dL (ref 32.0–36.0)
MCHC: 32.3 g/dL (ref 32.0–36.0)
MCV: 86 fL (ref 80–100)
MCV: 87 fL (ref 80–100)
MONO ABS: 0.5 x10 3/mm (ref 0.2–0.9)
MONOS PCT: 6.3 %
Monocyte #: 0.6 x10 3/mm (ref 0.2–0.9)
Monocyte %: 7.6 %
NEUTROS ABS: 5.8 10*3/uL (ref 1.4–6.5)
Neutrophil #: 6.1 10*3/uL (ref 1.4–6.5)
Neutrophil %: 73.9 %
Neutrophil %: 73.3 %
PLATELETS: 216 10*3/uL (ref 150–440)
Platelet: 210 10*3/uL (ref 150–440)
RBC: 2.48 10*6/uL — AB (ref 3.80–5.20)
RBC: 2.98 10*6/uL — AB (ref 3.80–5.20)
RDW: 15.8 % — AB (ref 11.5–14.5)
RDW: 15.9 % — ABNORMAL HIGH (ref 11.5–14.5)
WBC: 7.8 10*3/uL (ref 3.6–11.0)
WBC: 8.3 10*3/uL (ref 3.6–11.0)

## 2013-07-14 LAB — BASIC METABOLIC PANEL
ANION GAP: 4 — AB (ref 7–16)
BUN: 6 mg/dL — ABNORMAL LOW (ref 7–18)
CO2: 28 mmol/L (ref 21–32)
Calcium, Total: 7.5 mg/dL — ABNORMAL LOW (ref 8.5–10.1)
Chloride: 109 mmol/L — ABNORMAL HIGH (ref 98–107)
Creatinine: 1.59 mg/dL — ABNORMAL HIGH (ref 0.60–1.30)
EGFR (African American): 40 — ABNORMAL LOW
EGFR (Non-African Amer.): 34 — ABNORMAL LOW
GLUCOSE: 109 mg/dL — AB (ref 65–99)
Osmolality: 279 (ref 275–301)
Potassium: 3.7 mmol/L (ref 3.5–5.1)
SODIUM: 141 mmol/L (ref 136–145)

## 2013-07-15 LAB — CBC WITH DIFFERENTIAL/PLATELET
BASOS PCT: 0.5 %
Basophil #: 0 10*3/uL (ref 0.0–0.1)
Eosinophil #: 0.2 10*3/uL (ref 0.0–0.7)
Eosinophil %: 3.3 %
HCT: 23.1 % — ABNORMAL LOW (ref 35.0–47.0)
HGB: 7.6 g/dL — ABNORMAL LOW (ref 12.0–16.0)
LYMPHS ABS: 1.5 10*3/uL (ref 1.0–3.6)
Lymphocyte %: 21.1 %
MCH: 28.4 pg (ref 26.0–34.0)
MCHC: 32.8 g/dL (ref 32.0–36.0)
MCV: 87 fL (ref 80–100)
Monocyte #: 0.5 x10 3/mm (ref 0.2–0.9)
Monocyte %: 7.7 %
NEUTROS PCT: 67.4 %
Neutrophil #: 4.7 10*3/uL (ref 1.4–6.5)
Platelet: 224 10*3/uL (ref 150–440)
RBC: 2.68 10*6/uL — ABNORMAL LOW (ref 3.80–5.20)
RDW: 15.7 % — AB (ref 11.5–14.5)
WBC: 6.9 10*3/uL (ref 3.6–11.0)

## 2013-07-15 LAB — BASIC METABOLIC PANEL
Anion Gap: 5 — ABNORMAL LOW (ref 7–16)
BUN: 5 mg/dL — ABNORMAL LOW (ref 7–18)
CALCIUM: 7.8 mg/dL — AB (ref 8.5–10.1)
Chloride: 112 mmol/L — ABNORMAL HIGH (ref 98–107)
Co2: 27 mmol/L (ref 21–32)
Creatinine: 1.72 mg/dL — ABNORMAL HIGH (ref 0.60–1.30)
EGFR (African American): 36 — ABNORMAL LOW
EGFR (Non-African Amer.): 31 — ABNORMAL LOW
GLUCOSE: 101 mg/dL — AB (ref 65–99)
Osmolality: 284 (ref 275–301)
Potassium: 3.7 mmol/L (ref 3.5–5.1)
Sodium: 144 mmol/L (ref 136–145)

## 2013-07-16 LAB — COMPREHENSIVE METABOLIC PANEL
ALBUMIN: 2.1 g/dL — AB (ref 3.4–5.0)
ALK PHOS: 64 U/L
ALT: 7 U/L — AB (ref 12–78)
Anion Gap: 4 — ABNORMAL LOW (ref 7–16)
BUN: 5 mg/dL — ABNORMAL LOW (ref 7–18)
Bilirubin,Total: 0.3 mg/dL (ref 0.2–1.0)
Calcium, Total: 7.7 mg/dL — ABNORMAL LOW (ref 8.5–10.1)
Chloride: 108 mmol/L — ABNORMAL HIGH (ref 98–107)
Co2: 29 mmol/L (ref 21–32)
Glucose: 95 mg/dL (ref 65–99)
Osmolality: 278 (ref 275–301)
Potassium: 3.2 mmol/L — ABNORMAL LOW (ref 3.5–5.1)
SGOT(AST): 17 U/L (ref 15–37)
SODIUM: 141 mmol/L (ref 136–145)
Total Protein: 5.3 g/dL — ABNORMAL LOW (ref 6.4–8.2)

## 2013-07-16 LAB — HEMOGLOBIN: HGB: 9.4 g/dL — ABNORMAL LOW (ref 12.0–16.0)

## 2013-07-16 LAB — CREATININE, SERUM
CREATININE: 1.64 mg/dL — AB (ref 0.60–1.30)
GFR CALC AF AMER: 38 — AB
GFR CALC NON AF AMER: 33 — AB

## 2013-07-16 LAB — MAGNESIUM: Magnesium: 1.4 mg/dL — ABNORMAL LOW

## 2013-07-17 LAB — CBC WITH DIFFERENTIAL/PLATELET
Basophil #: 0.1 10*3/uL (ref 0.0–0.1)
Basophil #: 0 10*3/uL (ref 0.0–0.1)
Basophil %: 0.9 %
Basophil %: 0.6 %
EOS ABS: 0.2 10*3/uL (ref 0.0–0.7)
EOS PCT: 3.3 %
EOS PCT: 2.6 %
Eosinophil #: 0.2 10*3/uL (ref 0.0–0.7)
HCT: 29.2 % — ABNORMAL LOW (ref 35.0–47.0)
HCT: 29.7 % — AB (ref 35.0–47.0)
HGB: 9.4 g/dL — ABNORMAL LOW (ref 12.0–16.0)
HGB: 9.7 g/dL — ABNORMAL LOW (ref 12.0–16.0)
Lymphocyte #: 1.2 10*3/uL (ref 1.0–3.6)
Lymphocyte #: 0.7 10*3/uL — ABNORMAL LOW (ref 1.0–3.6)
Lymphocyte %: 18.6 %
Lymphocyte %: 11 %
MCH: 27.9 pg (ref 26.0–34.0)
MCH: 28.2 pg (ref 26.0–34.0)
MCHC: 32.3 g/dL (ref 32.0–36.0)
MCHC: 32.6 g/dL (ref 32.0–36.0)
MCV: 86 fL (ref 80–100)
MCV: 87 fL (ref 80–100)
MONO ABS: 0.4 x10 3/mm (ref 0.2–0.9)
Monocyte #: 0.6 x10 3/mm (ref 0.2–0.9)
Monocyte %: 9.2 %
Monocyte %: 5.6 %
NEUTROS ABS: 4.5 10*3/uL (ref 1.4–6.5)
NEUTROS PCT: 80.2 %
Neutrophil #: 5.1 10*3/uL (ref 1.4–6.5)
Neutrophil %: 68 %
Platelet: 229 10*3/uL (ref 150–440)
Platelet: 233 10*3/uL (ref 150–440)
RBC: 3.38 10*6/uL — ABNORMAL LOW (ref 3.80–5.20)
RBC: 3.43 10*6/uL — AB (ref 3.80–5.20)
RDW: 15.9 % — AB (ref 11.5–14.5)
RDW: 16.1 % — AB (ref 11.5–14.5)
WBC: 6.6 10*3/uL (ref 3.6–11.0)
WBC: 6.4 10*3/uL (ref 3.6–11.0)

## 2013-07-17 LAB — PROTIME-INR
INR: 1.1
Prothrombin Time: 13.8 secs (ref 11.5–14.7)

## 2013-07-17 LAB — BASIC METABOLIC PANEL
ANION GAP: 5 — AB (ref 7–16)
BUN: 4 mg/dL — AB (ref 7–18)
CALCIUM: 7.7 mg/dL — AB (ref 8.5–10.1)
CHLORIDE: 108 mmol/L — AB (ref 98–107)
CREATININE: 1.53 mg/dL — AB (ref 0.60–1.30)
Co2: 27 mmol/L (ref 21–32)
EGFR (African American): 42 — ABNORMAL LOW
EGFR (Non-African Amer.): 36 — ABNORMAL LOW
GLUCOSE: 101 mg/dL — AB (ref 65–99)
OSMOLALITY: 276 (ref 275–301)
POTASSIUM: 3.8 mmol/L (ref 3.5–5.1)
SODIUM: 140 mmol/L (ref 136–145)

## 2013-07-18 LAB — COMPREHENSIVE METABOLIC PANEL
Albumin: 2 g/dL — ABNORMAL LOW (ref 3.4–5.0)
Alkaline Phosphatase: 66 U/L
Anion Gap: 7 (ref 7–16)
BILIRUBIN TOTAL: 0.4 mg/dL (ref 0.2–1.0)
BUN: 5 mg/dL — AB (ref 7–18)
CALCIUM: 7.8 mg/dL — AB (ref 8.5–10.1)
CREATININE: 1.42 mg/dL — AB (ref 0.60–1.30)
Chloride: 108 mmol/L — ABNORMAL HIGH (ref 98–107)
Co2: 28 mmol/L (ref 21–32)
EGFR (African American): 45 — ABNORMAL LOW
GFR CALC NON AF AMER: 39 — AB
Glucose: 114 mg/dL — ABNORMAL HIGH (ref 65–99)
OSMOLALITY: 283 (ref 275–301)
Potassium: 3.5 mmol/L (ref 3.5–5.1)
SGOT(AST): 10 U/L — ABNORMAL LOW (ref 15–37)
SGPT (ALT): <6 U/L — ABNORMAL LOW (ref 12–78)
Sodium: 143 mmol/L (ref 136–145)
Total Protein: 5 g/dL — ABNORMAL LOW (ref 6.4–8.2)

## 2013-07-18 LAB — CBC WITH DIFFERENTIAL/PLATELET
BASOS PCT: 0.4 %
Basophil #: 0 10*3/uL (ref 0.0–0.1)
EOS ABS: 0.2 10*3/uL (ref 0.0–0.7)
Eosinophil %: 3.5 %
HCT: 27.6 % — ABNORMAL LOW (ref 35.0–47.0)
HGB: 8.9 g/dL — AB (ref 12.0–16.0)
LYMPHS PCT: 18 %
Lymphocyte #: 1.1 10*3/uL (ref 1.0–3.6)
MCH: 27.7 pg (ref 26.0–34.0)
MCHC: 32.2 g/dL (ref 32.0–36.0)
MCV: 86 fL (ref 80–100)
MONO ABS: 0.6 x10 3/mm (ref 0.2–0.9)
Monocyte %: 9.5 %
Neutrophil #: 4.1 10*3/uL (ref 1.4–6.5)
Neutrophil %: 68.6 %
Platelet: 236 10*3/uL (ref 150–440)
RBC: 3.2 10*6/uL — AB (ref 3.80–5.20)
RDW: 15.6 % — ABNORMAL HIGH (ref 11.5–14.5)
WBC: 6 10*3/uL (ref 3.6–11.0)

## 2013-07-18 LAB — MAGNESIUM: Magnesium: 1.7 mg/dL — ABNORMAL LOW

## 2013-07-19 LAB — COMPREHENSIVE METABOLIC PANEL
ANION GAP: 5 — AB (ref 7–16)
Albumin: 2.1 g/dL — ABNORMAL LOW (ref 3.4–5.0)
Alkaline Phosphatase: 66 U/L
BUN: 8 mg/dL (ref 7–18)
Bilirubin,Total: 0.3 mg/dL (ref 0.2–1.0)
Calcium, Total: 7.8 mg/dL — ABNORMAL LOW (ref 8.5–10.1)
Chloride: 109 mmol/L — ABNORMAL HIGH (ref 98–107)
Co2: 29 mmol/L (ref 21–32)
Creatinine: 1.58 mg/dL — ABNORMAL HIGH (ref 0.60–1.30)
EGFR (African American): 40 — ABNORMAL LOW
EGFR (Non-African Amer.): 34 — ABNORMAL LOW
Glucose: 110 mg/dL — ABNORMAL HIGH (ref 65–99)
Osmolality: 284 (ref 275–301)
Potassium: 3.7 mmol/L (ref 3.5–5.1)
SGOT(AST): 16 U/L (ref 15–37)
SGPT (ALT): 7 U/L — ABNORMAL LOW (ref 12–78)
Sodium: 143 mmol/L (ref 136–145)
TOTAL PROTEIN: 5.4 g/dL — AB (ref 6.4–8.2)

## 2013-07-19 LAB — CBC WITH DIFFERENTIAL/PLATELET
Basophil #: 0 10*3/uL (ref 0.0–0.1)
Basophil %: 0.7 %
EOS ABS: 0.2 10*3/uL (ref 0.0–0.7)
EOS PCT: 3.3 %
HCT: 28.3 % — ABNORMAL LOW (ref 35.0–47.0)
HGB: 9.3 g/dL — ABNORMAL LOW (ref 12.0–16.0)
Lymphocyte #: 1.1 10*3/uL (ref 1.0–3.6)
Lymphocyte %: 16.1 %
MCH: 28.3 pg (ref 26.0–34.0)
MCHC: 33 g/dL (ref 32.0–36.0)
MCV: 86 fL (ref 80–100)
MONO ABS: 0.6 x10 3/mm (ref 0.2–0.9)
Monocyte %: 8.9 %
Neutrophil #: 4.8 10*3/uL (ref 1.4–6.5)
Neutrophil %: 71 %
Platelet: 241 10*3/uL (ref 150–440)
RBC: 3.3 10*6/uL — ABNORMAL LOW (ref 3.80–5.20)
RDW: 15.8 % — ABNORMAL HIGH (ref 11.5–14.5)
WBC: 6.7 10*3/uL (ref 3.6–11.0)

## 2013-07-19 LAB — MAGNESIUM: Magnesium: 2.2 mg/dL

## 2013-07-20 LAB — HEMOGLOBIN: HGB: 9.2 g/dL — AB (ref 12.0–16.0)

## 2013-08-23 ENCOUNTER — Encounter: Payer: Self-pay | Admitting: Surgery

## 2013-09-18 ENCOUNTER — Encounter: Payer: Self-pay | Admitting: Surgery

## 2013-10-15 DIAGNOSIS — S31109A Unspecified open wound of abdominal wall, unspecified quadrant without penetration into peritoneal cavity, initial encounter: Secondary | ICD-10-CM | POA: Insufficient documentation

## 2013-10-19 ENCOUNTER — Encounter: Payer: Self-pay | Admitting: Surgery

## 2013-11-18 ENCOUNTER — Encounter: Payer: Self-pay | Admitting: Surgery

## 2013-12-01 DIAGNOSIS — E1169 Type 2 diabetes mellitus with other specified complication: Secondary | ICD-10-CM | POA: Insufficient documentation

## 2013-12-01 DIAGNOSIS — E669 Obesity, unspecified: Secondary | ICD-10-CM

## 2013-12-15 ENCOUNTER — Ambulatory Visit: Payer: Self-pay | Admitting: Internal Medicine

## 2013-12-19 ENCOUNTER — Encounter: Payer: Self-pay | Admitting: Surgery

## 2013-12-23 ENCOUNTER — Encounter: Payer: Self-pay | Admitting: General Surgery

## 2014-01-18 ENCOUNTER — Encounter: Payer: Self-pay | Admitting: Surgery

## 2014-01-18 ENCOUNTER — Encounter: Payer: Self-pay | Admitting: General Surgery

## 2014-02-18 ENCOUNTER — Encounter: Payer: Self-pay | Admitting: Surgery

## 2014-03-02 DIAGNOSIS — E538 Deficiency of other specified B group vitamins: Secondary | ICD-10-CM | POA: Insufficient documentation

## 2014-03-21 ENCOUNTER — Encounter: Payer: Self-pay | Admitting: Surgery

## 2014-04-07 ENCOUNTER — Encounter: Payer: Self-pay | Admitting: Surgery

## 2014-04-19 ENCOUNTER — Encounter: Payer: Self-pay | Admitting: Surgery

## 2014-06-06 ENCOUNTER — Encounter
Admit: 2014-06-06 | Disposition: A | Discharge status: Home / Self Care | Payer: Self-pay | Attending: Surgery | Admitting: Surgery

## 2014-06-11 NOTE — Consult Note (Signed)
Chief Complaint:  Subjective/Chief Complaint seen for lower GI bleeding. denies n/v some mild right sided abdominal pain/discomfort.  some recurrent bleeding into ostomy since this am.   VITAL SIGNS/ANCILLARY NOTES: **Vital Signs.:   30-May-15 11:57  Vital Signs Type Q 4hr  Temperature Temperature (F) 98.1  Temperature Source oral  Pulse Pulse 61  Respirations Respirations 18  Systolic BP Systolic BP 263  Diastolic BP (mmHg) Diastolic BP (mmHg) 74  Mean BP 95  Pulse Ox % Pulse Ox % 94  Pulse Ox Activity Level  At rest  Oxygen Delivery Room Air/ 21 %   Brief Assessment:  GEN obese   Cardiac Regular   Respiratory clear BS   Gastrointestinal details normal Soft  Bowel sounds normal  No rebound tenderness  No gaurding  mild right and epigastric tenderness.   Lab Results: Routine Chem:  30-May-15 05:17   Glucose, Serum  101  BUN  4  Creatinine (comp)  1.53  Sodium, Serum 140  Potassium, Serum 3.8  Chloride, Serum  108  CO2, Serum 27  Calcium (Total), Serum  7.7  Anion Gap  5  Osmolality (calc) 276  eGFR (African American)  42  eGFR (Non-African American)  36 (eGFR values <39m/min/1.73 m2 may be an indication of chronic kidney disease (CKD). Calculated eGFR is useful in patients with stable renal function. The eGFR calculation will not be reliable in acutely ill patients when serum creatinine is changing rapidly. It is not useful in  patients on dialysis. The eGFR calculation may not be applicable to patients at the low and high extremes of body sizes, pregnant women, and vegetarians.)  Routine Hem:  30-May-15 05:17   WBC (CBC) 6.6  RBC (CBC)  3.38  Hemoglobin (CBC)  9.4  Hematocrit (CBC)  29.2  Platelet Count (CBC) 229  MCV 86  MCH 27.9  MCHC 32.3  RDW  15.9  Neutrophil % 68.0  Lymphocyte % 18.6  Monocyte % 9.2  Eosinophil % 3.3  Basophil % 0.9  Neutrophil # 4.5  Lymphocyte # 1.2  Monocyte # 0.6  Eosinophil # 0.2  Basophil # 0.1 (Result(s) reported  on 17 Jul 2013 at 05:50AM.)   Assessment/Plan:  Assessment/Plan:  Assessment 1) recurrent lower GI bleeding in the setting of bleeding from known site in the right colon.  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  Colonoscopy yesterday without active bleeding or apparaeant evidence of ischemic/necrotic colon.  Patient with bead microembolization on 5/26 initially successful.   Plan 1) will get  cbc and pt. Discussed with Dr CBurt Knack continue close observation.   Electronic Signatures: SLoistine Simas(MD)  (Signed [id number])  Authored: Chief Complaint, VITAL SIGNS/ANCILLARY NOTES, Brief Assessment, Lab Results, Assessment/Plan   Last Updated: 30-May-15 14:13 by SLoistine Simas(MD)

## 2014-06-11 NOTE — Consult Note (Signed)
Chief Complaint:  Subjective/Chief Complaint seen for GI bleeding.  no blood in ostomy since last night, currently brown mushy stool, no red tinge.  denies n/v or abdominal pain.   VITAL SIGNS/ANCILLARY NOTES: **Vital Signs.:   31-May-15 12:25  Vital Signs Type Q 4hr  Temperature Temperature (F) 97.7  Celsius 36.5  Temperature Source oral  Pulse Pulse 67  Respirations Respirations 18  Systolic BP Systolic BP 671  Diastolic BP (mmHg) Diastolic BP (mmHg) 69  Mean BP 84  Pulse Ox % Pulse Ox % 98  Pulse Ox Activity Level  At rest  Oxygen Delivery Room Air/ 21 %  *Intake and Output.:   31-May-15 11:00  Stool  300cc liquid brown stool   Brief Assessment:  GEN obese   Cardiac Regular   Respiratory clear BS   Gastrointestinal details normal Nontender  Bowel sounds normal   Lab Results: Hepatic:  31-May-15 05:45   Bilirubin, Total 0.4  Alkaline Phosphatase 66 (45-117 NOTE: New Reference Range 01/08/13)  SGPT (ALT)  < 6  SGOT (AST)  10  Total Protein, Serum  5.0  Albumin, Serum  2.0  Routine Chem:  31-May-15 05:45   Glucose, Serum  114  BUN  5  Creatinine (comp)  1.42  Sodium, Serum 143  Potassium, Serum 3.5  Chloride, Serum  108  CO2, Serum 28  Calcium (Total), Serum  7.8  Osmolality (calc) 283  eGFR (African American)  45  eGFR (Non-African American)  39 (eGFR values <54m/min/1.73 m2 may be an indication of chronic kidney disease (CKD). Calculated eGFR is useful in patients with stable renal function. The eGFR calculation will not be reliable in acutely ill patients when serum creatinine is changing rapidly. It is not useful in  patients on dialysis. The eGFR calculation may not be applicable to patients at the low and high extremes of body sizes, pregnant women, and vegetarians.)  Anion Gap 7  Magnesium, Serum  1.7 (1.8-2.4 THERAPEUTIC RANGE: 4-7 mg/dL TOXIC: > 10 mg/dL  -----------------------)  Routine Coag:  30-May-15 14:16   INR 1.1 (INR reference  interval applies to patients on anticoagulant therapy. A single INR therapeutic range for coumarins is not optimal for all indications; however, the suggested range for most indications is 2.0 - 3.0. Exceptions to the INR Reference Range may include: Prosthetic heart valves, acute myocardial infarction, prevention of myocardial infarction, and combinations of aspirin and anticoagulant. The need for a higher or lower target INR must be assessed individually. Reference: The Pharmacology and Management of the Vitamin K  antagonists: the seventh ACCP Conference on Antithrombotic and Thrombolytic Therapy. CIWPYK.9983Sept:126 (3suppl): [id number] A HCT value >55% may artifactually increase the PT.  In one study,  the increase was an average of 25%. Reference:  "Effect on Routine and Special Coagulation Testing Values of Citrate Anticoagulant Adjustment in Patients with High HCT Values." American Journal of Clinical Pathology 2006;126:400-405.)  Routine Hem:  22-May-15 15:08   Hemoglobin (CBC)  10.2  23-May-15 00:25   Hemoglobin (CBC)  8.4    07:38   Hemoglobin (CBC)  7.5  24-May-15 04:18   Hemoglobin (CBC)  6.6    20:50   Hemoglobin (CBC)  7.6 (Result(s) reported on 11 Jul 2013 at 09:05PM.)  25-May-15 04:46   Hemoglobin (CBC)  8.2    12:22   Hemoglobin (CBC)  7.2 (Result(s) reported on 12 Jul 2013 at 12:53PM.)    18:13   Hemoglobin (CBC)  6.8 (Result(s) reported on 12 Jul 2013 at 06:37PM.)  26-May-15 06:43   Hemoglobin (CBC)  7.3    17:03   Hemoglobin (CBC)  8.3 (Result(s) reported on 13 Jul 2013 at 05:41PM.)  27-May-15 06:48   Hemoglobin (CBC)  6.9    18:28   Hemoglobin (CBC)  8.4  28-May-15 05:47   Hemoglobin (CBC)  7.6  29-May-15 05:17   Hemoglobin (CBC)  9.4 (Result(s) reported on 16 Jul 2013 at 06:09AM.)  30-May-15 05:17   Hemoglobin (CBC)  9.4    14:16   Hemoglobin (CBC)  9.7  31-May-15 05:45   WBC (CBC) 6.0  RBC (CBC)  3.20  Hemoglobin (CBC)  8.9  Hematocrit  (CBC)  27.6  Platelet Count (CBC) 236  MCV 86  MCH 27.7  MCHC 32.2  RDW  15.6  Neutrophil % 68.6  Lymphocyte % 18.0  Monocyte % 9.5  Eosinophil % 3.5  Basophil % 0.4  Neutrophil # 4.1  Lymphocyte # 1.1  Monocyte # 0.6  Eosinophil # 0.2  Basophil # 0.0 (Result(s) reported on 18 Jul 2013 at 06:12AM.)   Assessment/Plan:  Assessment/Plan:  Assessment 1) recurrent GI bleeding source about hepatic flexure,  currently no bleeding, hemodynamically stable.  2) h/o left colectomy for diverticular perforation.   Plan 1) continue obs, doing well with current diet.  no new GI recs.  Dr Rayann Heman to return tomorrow.   Electronic Signatures: Loistine Simas (MD)  (Signed 31-May-15 13:41)  Authored: Chief Complaint, VITAL SIGNS/ANCILLARY NOTES, Brief Assessment, Lab Results, Assessment/Plan   Last Updated: 31-May-15 13:41 by Loistine Simas (MD)

## 2014-06-11 NOTE — Consult Note (Signed)
Brief Consult Note: Diagnosis: LGI bleed.   Patient was seen by consultant.   Consult note dictated.   Recommend further assessment or treatment.   Comments: 1.) LGI bleed:  s/p l colectomy and cecal repair 04/2013.  Colonoscopy 03/2013 but R colon not visualized due to poor prep.  CT today shows thickening HF.  Agree this likely ischemia or infection.   Recs: - continue to monitor Hgb - would hold off on colonoscopy for now unless continued bleeding and drop in Hgb - R colon not visualized at time of colonoscopy 03/2013, so repeating this study would be reasonable to r/o malignancy of HF, preferably once any infection, ischemia has had a chance to cool down as an outpatient.  Electronic Signatures: Arther Dames (MD)  (Signed 23-May-15 15:23)  Authored: Brief Consult Note   Last Updated: 23-May-15 15:23 by Arther Dames (MD)

## 2014-06-11 NOTE — Op Note (Signed)
PATIENT NAME:  [name], [name] MR#:  [id number] DATE OF BIRTH:  12/31/1950  DATE OF PROCEDURE:  05/09/2013  PREOPERATIVE DIAGNOSES: Large bowel obstruction, diverticulitis and perforated viscus.  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   PROCEDURE:  1.  Exploratory laparotomy.  2.  Lysis of adhesions.  3.  Left colectomy.  4.  End colostomy.  5.  Colporrhaphy of right colon with Phillip Heal patch and fibrin glue application.  6.  Partial omentectomy.   SPECIMENS: Left colon and omentum.   FINDINGS: As described above.   ESTIMATED BLOOD LOSS: 500 mL.   DRAINS: A 15 mm JP in the right gutter.   DESCRIPTION OF PROCEDURE: With informed consent, supine position and general oral endotracheal anesthesia, Foley catheter was placed under sterile technique along with SCDs. Nasogastric tube was placed. [name] abdomen was widely prepped and draped with ChloraPrep solution. Timeout was observed. A midline skin incision was fashioned from above the umbilicus and extending into the lower midline scar with a scalpel and electrocautery through musculofascial layers. Adhesions of omentum and small bowel were taken down off the anterior abdominal wall circumferentially to allow placement of a self-retaining abdominal wall retractor.   Adhesiolysis was undertaken exposing the entire pelvis. In the left lower quadrant, there was an inflammatory mass present by palpation. The right colon and transverse colon were massively dilated. Exploration of the right colon demonstrated a small area of exudate on the right colon on the antimesenteric border approximately its midportion. With gentle manipulation, a very small amount of air and stool were identified.   At this point, this area was imbricated with seromuscular multiple 3-0 silk sutures. Attention was then turned to the left colon.  The colon was  mobilized along the white line of Toldt utilizing electrocautery. Distal to the stricture, a mesenteric window was fashioned and a contoured 40 stapler was used to transect the colon. The mesocolon was then sequentially taken utilizing the LigaSure apparatus and suture and ties of #0 Vicryl suture. The splenic flexure was then mobilized along the white line of Toldt with electrocautery, gentle traction and application of the LigaSure apparatus. The omentum was then taken off the transverse colon in the avascular plane fully with electrocautery device.   Colon was then transected at approximately the distal transverse colon and this allowed sufficient mobility for a left upper quadrant colostomy. The patient had a very unusual appearing abdominal wall with a very prominent transverse crease extending along the line of the umbilicus, making a colostomy in this area not feasible. As such, left upper quadrant colostomy site was chosen.   Omentum was then fully mobilized off the colon such that a Phillip Heal patch could be performed to the site of the right colonic perforation. In doing this, a portion of the omentum appeared to be devascularized and this was then sequentially divided with the LigaSure apparatus and submitted as separate specimen.   The left upper quadrant colostomy site was then chosen. A wheal of skin was excised with a scalpel electrocautery along with a large portion of abdominal wall fat to accommodate the left colon. A cruciate incision was then fashioned on the anterior and posterior rectus sheath with muscle-splitting technique. The left colon was then brought up through this site without difficulty or tension. It was secured to the fascia at several points with silk suture.   An omental tongue was then placed over the site of the perforation  with 5 mL of Evicel fibrin glue and secured to the seromuscular layer at several sites with 3-0 silk suture. With lap and needle count correct x 2, the  fascia was then closed in the extremes of the wound utilizing looped #1 PDS suture. Multiple interrupted #1 Vicryls and #2 Vicryls were placed as internal retention sutures. Subcutaneous tissues were then irrigated. Skin staples used to reapproximate the skin edges.   There was sufficient length of the colostomy such that a portion of approximately 3 cm of the distal colon was excised with electrocautery down to healthy-appearing and bleeding colon. A standard Brooke colostomy was then performed utilizing 3-0 chromic suture. Ostomy appliance was placed and immediately the ostomy began functioning with a large amount of liquid stool and a large amount of air. Sterile dressings were applied and the patient was subsequently taken to the recovery room and then the ICU in stable and intubated condition.   ____________________________ Jeannette How Marina Gravel, MD FACS mab:aw D: 05/09/2013 21:44:51 ET T: 05/10/2013 09:35:08 ET JOB#: [id number]  cc: Elta Guadeloupe A. Marina Gravel, MD, <Dictator> Hortencia Conradi MD ELECTRONICALLY SIGNED 05/12/2013 13:55

## 2014-06-11 NOTE — Discharge Summary (Signed)
PATIENT NAME:  [name], [name] MR#:  [id number] DATE OF BIRTH:  1951-01-09  DATE OF ADMISSION:  05/05/2013 DATE OF DISCHARGE:  05/07/2013  FINAL DIAGNOSES:  1.  Diverticulitis.  2.  Partial large bowel obstruction secondary to #1.  3.  Hypertension.  4.  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   HISTORY AND PHYSICAL: Please see dictated admission history and physical.   HOSPITAL COURSE: The patient was admitted with nausea, vomiting and abdominal pain. Abdominal CT prior to admission had revealed diverticulitis. The patient had failed outpatient Cipro and Flagyl. She was placed on Zosyn and showed significant improvement with this. Diet was able to be started. She was evaluated by GI after followup CT scan revealed, what appeared to be, an area of narrowing in the initial portion of the descending colon. She had had colonoscopy performed in February, although this was incomplete secondary to poor prep. This area was evaluated and there was no mass seen at that time. GI felt that this was likely due to diverticulitis. Surgical consultation was obtained, who recommended conservative measures, although there was continued concerned about the area potentially worsening to become a repeat blockage. The patient was placed on clears and then converted over to oral antibiotics, which she tolerated. She was advanced to a low residue diet, which she also tolerated. She was ambulating independently and felt ready to go home, so was discharged home in stable condition with her physical activity to be up as tolerated. She will follow a low-residue diet and plan was for her to follow up in our office in the next 1 to 2 weeks. She should check her sugars daily and record them.   DISCHARGE MEDICATIONS:  1.  Micardis 80 mg p.o. daily. 2.  Levothyroxine 0.75 mg p.o. daily. 3.  Metoprolol XL 25 mg p.o. daily.  4.  Lipitor 40 mg at bedtime. 5.  Glimepiride 4 mg p.o. b.i.d.  6.  Norco  5/325, 1 p.o. q.6 hours as needed for pain.  7.  Augmentin 500 mg p.o. b.i.d. x 8 days.  8.  MiraLax 17 grams p.o. daily as needed for constipation.   She was given instructions to hold amlodipine, aspirin, Cipro and Flagyl.   ____________________________ Adin Hector, MD bjk:aw D: 05/10/2013 07:19:36 ET T: 05/10/2013 10:14:20 ET JOB#: [id number]  cc: Adin Hector, MD, <Dictator> Ramonita Lab MD ELECTRONICALLY SIGNED 05/13/2013 8:02

## 2014-06-11 NOTE — Consult Note (Signed)
PATIENT NAME:  [name], [name] MR#:  [id number] DATE OF BIRTH:  01-25-1951  DATE OF CONSULTATION:  07/10/2013  CONSULTING PHYSICIAN:  Arther Dames, MD  REFERRING PHYSICIAN:  Dr. Marlyce Huge  REASON FOR CONSULT: Lower GI bleeding.   HISTORY OF PRESENT ILLNESS: [name] is a 64 year old female with a past medical history notable for a recent left-sided colectomy along with repair of a right colonic perforation back in March [id number], complicated by a wound infection and clean out, who is now admitted to the hospital for evaluation of blood from her ostomy.   She reports she has noticed blood for about 24 hours or so. Initially it started as a small amount, but then progressed to filling up most of the bag. She drank some p.o. contrast in the Emergency Room, which then seemed to increase the amount of bleeding.   She denies any prior history of GI bleeding. She has not seen any black stool from the ostomy. She also does have some mild abdominal pain on the right side of her abdomen to about the level of her umbilicus.   Of note, she did undergo a colonoscopy in February 2015, during which there was a sigmoid stricture which was able to be passed with an upper scope. However, the more proximal colon had a poor prep and thus could not be visualized. The colonoscopy was done for polyp surveillance.    PAST MEDICAL HISTORY: 1.  Diverticulitis.  2.  Diastolic murmur.  3.  Psoriasis.  4.  Hypertension.    SURGICAL HISTORY:  1.  Left colectomy, right-sided micro-perforation repair, as in HPI.   2.  Cholecystectomy.  3.  Left shoulder nerve release.  4.  Total hysterectomy.   HOME MEDICATIONS: 1.  Metoprolol 25 mg daily.  2.  Lipitor 40 mg daily.  3.  Micardis daily.  4.  Levothyroxine 75 mcg daily.  5.  Glimepiride 4 mg b.i.d.  6.  Hydrocodone p.r.n.   ALLERGIES: NKDA.   SOCIAL HISTORY: She denies any tobacco or alcohol.   FAMILY HISTORY: No family history of GI malignancy that  she is aware of.   REVIEW OF SYSTEMS:  A 10-system review was conducted. It is negative, except as stated in the HPI.  PHYSICAL EXAMINATION: VITAL SIGNS: Her temperature is 97.9, T-max is 99.1, pulse is 82, blood pressure is 118/78, pulse ox is 95% on room air. GENERAL:  Alert and oriented x 4.  No acute distress. Appears stated age. HEENT: Normocephalic/atraumatic. Extraocular movements are intact. Anicteric. NECK: Soft, supple. JVP appears normal. No adenopathy. CHEST: Clear to auscultation. No wheeze or crackle. Respirations unlabored. HEART: Regular. No murmur, rub, or gallop.  Normal S1 and S2. ABDOMEN: Soft, nontender, nondistended.  Normal active bowel sounds in all four quadrants.  No organomegaly. No masses. Positive for ostomy on the left side. Does contain maroon-appearing stool. No brown stool is appreciated. EXTREMITIES: No swelling, well perfused. SKIN: No rash or lesion. Skin color, texture, turgor normal. NEUROLOGICAL: Grossly intact. PSYCHIATRIC: Normal tone and affect. MUSCULOSKELETAL: No joint swelling or erythema.   LABORATORY DATA: Sodium 140, potassium 4.1, creatinine 1.4, BUN 14. Her liver enzymes are normal, except for an albumin of 2.6. Her white count is 9, hemoglobin 8.4, hematocrit 27, platelets are 314. Her C. diff is negative. CT scan shows thickening in the hepatic flexure.  ASSESSMENT AND PLAN: Bleeding per ostomy: Given the CT findings, I do suspect this is likely either ischemic or infectious. I do suspect this will  resolve with antibiotics and conservative measures. However, if she does continue to bleed and does experience a drop in her hemoglobin, then we will be more inclined to perform a colonoscopy to investigate this. Would prefer to give the underlying etiology a chance to cool down to decrease the perforation risk.   Given that the right colon was not visualized at her last colonoscopy, it would be reasonable to repeat a colonoscopy preferably as an  outpatient to follow up this thickening in the hepatic flexure.   For now, will continue to follow and will monitor hemoglobins and the amount of rectal bleeding with you to decide on the urgency of the procedure.  Thank you for this consult.    ____________________________ Arther Dames, MD mr:mr D: 07/10/2013 15:57:07 ET T: 07/10/2013 20:14:26 ET JOB#: [id number]  cc: Arther Dames, MD, <Dictator> Alicia Life MD ELECTRONICALLY SIGNED 07/21/2013 11:42

## 2014-06-11 NOTE — Consult Note (Signed)
PATIENT NAME:  [name] MR#:  [id number] DATE OF BIRTH:  Nov 08, 1950  DATE OF CONSULTATION:  05/05/2013  CONSULTING PHYSICIAN:  [name]. [name], MD   CHIEF COMPLAINT: Abdominal pain.   HISTORY OF PRESENT ILLNESS: This is a 64 year old morbidly obese patient who presents with abdominal pain, which has nearly resolved. She is feeling much better than she was when she was admitted. She has been treated as an outpatient for diverticulitis and was admitted with a presumptive diagnosis of diverticulitis. I was asked to see the patient for findings of a thickened area in the colon suggestive of a mass versus active and acute diverticulitis. The patient states she had a  "good" bowel movement this morning and feels better. Her pain is almost gone. She has had no nausea or vomiting today and no melena or hematochezia.   PAST MEDICAL HISTORY: Significant for morbid obesity, hypertension, diabetes, kidney  disease and diverticulitis. She also abuses tobacco products and has hyperlipidemia.   PAST SURGICAL HISTORY: Cholecystectomy, hysterectomy, oophorectomy and back surgery.   ALLERGIES: None.   MEDICATIONS: Multiple, see chart.   FAMILY HISTORY: Noncontributory.   SOCIAL HISTORY: The patient smokes 2 to 3 cigarettes per day, but has not smoked in 2 weeks. Does not drink alcohol. She is disabled.   REVIEW OF SYSTEMS: Ten system review is performed and negative with the exception of that mentioned in the HPI.    ADDITIONAL HISTORY: The patient states that she had a colonoscopy 3 years ago, which showed some polyps, which were removed.   PHYSICAL EXAMINATION: GENERAL: Morbidly obese female patient, BMI of 39, although her appearance suggests that BMI is higher.  VITAL SIGNS: Show an afebrile patient with a pulse of 76, respirations 18, blood pressure 93/58, 97% room air sat.  HEENT: Shows no scleral icterus.  NECK: No palpable neck nodes.  CHEST: Clear to auscultation.  CARDIAC: Regular rate  and rhythm.  ABDOMEN: Morbidly obese. There is a midline scar in the infraumbilical area, which is scarified and no hernia is noted. She is minimally tender in the left lower quadrant without peritoneal signs.  EXTREMITIES: Without edema.  NEUROLOGIC: Grossly intact.  INTEGUMENT: Shows no jaundice.   CT scan is personally reviewed and compared to prior CT scan from the 11th of March as well with findings of thickening of the colon in the sigmoid area.   Electrolytes are within normal limits, creatinine of 1.9. H and H of 10.1 and 32, platelet count of 259 and a white count is 7.3. Her albumin on the 16th of March was 2.9.   ASSESSMENT AND PLAN: This is a morbidly obese female patient with a question of a partially obstructing colon mass, which I believe is likely due to diverticulitis. She had an attempt at a colonoscopy a few days ago in which Dr. Vira Agar was able to pass the smaller upper endoscope past this mass in spite of some tortuosity, but did not notice or make mention of any intraluminal mass effect to suggest malignancy. In addition, she has had a colonoscopy per her report 3 years ago, which would suggest that she does not have an aggressive large tumor, although that is not an impossibility. At this point, her symptoms are better. Her pain is much improved. She had what she called a "good" bowel movement this morning and feels much better. I would recommend continuing her IV antibiotics and workup as necessary possibly to include a repeated attempt at sigmoidoscopy and/or full colonoscopy to rule  out malignancy, although I believe that is low likelihood. Should she require an emergency surgery, a colostomy in this morbidly obese patient would be very difficult to manage and would like to be avoided. Primary resection if necessary could be performed, but would, again, be difficult and risky considering her comorbidities.    ____________________________ [name] [name],  MD rec:dmm D: 05/05/2013 17:31:27 ET T: 05/05/2013 19:21:13 ET JOB#: [id number]  cc: [name]. [name], MD, <Dictator> [name] ELECTRONICALLY SIGNED 05/06/2013 17:41

## 2014-06-11 NOTE — Discharge Summary (Signed)
PATIENT NAME:  [name], [name] MR#:  [id number] DATE OF BIRTH:  [birth year]  DATE OF ADMISSION:  05/09/2013  DATE OF DISCHARGE:  05/17/2013  DISCHARGE DIAGNOSES:  1.  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  2.  History of diverticulitis.  3.  History of obesity.  4.  Aortic stenosis.  5.  Diabetes mellitus. 6.  Hypertension. 7.  Hyperlipidemia.  DISCHARGE MEDICATIONS ARE AS FOLLOWS: (Dictation Anomaly) <<MISSING TEXT>>  p.o. daily, L-thyroxine 75 mcg p.o. daily, metoprolol 25 mg p.o. daily, Lipitor 50 mg p.o. at bedtime, glimepiride 4 mg p.o. b.i.d., Norco 1 tab p.o. q. 4 hours p.r.n. pain, Flagyl 500 mg p.o. q. 8 hours x 7 days, ciprofloxacin 500 mg p.o. b.i.d. x 7 days.   PROCEDURES PERFORMED: 1.  Exploratory laparotomy.  2.  Closure of cecal perforation. 3.  (Dictation Anomaly) <<MISSING TEXT>> with end colostomy.  INDICATION FOR ADMISSION AS FOLLOWS: [name] is a pleasant, 64 year old who presented with free air following a perforation, likely due to cecal distention from downstream diverticular stricture. She was admitted for management of this issue.   HOSPITAL COURSE: [name] was brought to the Operating Room suite. She underwent sigmoid colectomy with primary repair of a colon injury. She then was admitted, made initially n.p.o. and given IV fluids. Her diet was advanced. Her ostomy output increased. She also was transitioned from IV to p.o. antibiotics for 14 days total course, as well as IV to oral pain meds. At time of discharge, she was taking good p.o., with good p.o. pain control. She was voiding and stooling without difficulties.   DISCHARGE INSTRUCTIONS ARE AS FOLLOWS:  [name] is to follow up with Dr. Marina Gravel or myself in approximately one week to ensure she is continuing to improve. She is to call or return to the ED if she has increased pain, nausea, vomiting, redness, drainage from incision.    ____________________________ Glena Norfolk Anabel Lykins, MD cal:mr D: 05/16/2013 09:02:00 ET T: 05/16/2013 18:40:40 ET JOB#: [id number]  cc: Harrell Gave A. Tula Schryver, MD, <Dictator>

## 2014-06-11 NOTE — Consult Note (Signed)
PATIENT NAME:  [name], [name] MR#:  [id number] DATE OF BIRTH:  22-Jul-1950  DATE OF CONSULTATION:  05/05/2013  REFERRING PHYSICIAN:  Adin Hector, MD CONSULTING PHYSICIAN:  Theodore Demark, NP  REASON FOR CONSULTATION: GI consult ordered by Dr. Caryl Comes to evaluate abdominal pain with obstructing mass on CT.  Appreciate consult for 64 year old [place] American woman with a complex health history, admitted with diverticulitis not responsive to Cipro, Flagyl therapy that was started on 03/11. Has been changed to Zosyn yesterday and says she is feeling much better. Nausea, vomiting better, less weakness and less pain, which she reports of starting in the suprapubic area and radiating upwards. States she had a good bowel movement today without signs of blood or blackness after having some stool softeners yesterday and denies further GI complaints. Had CT today with findings of diverticulitis in the descending colon as well as findings concerning for a nearly obstructing mass in that region. The patient also had CT on 03/11 with no mass-like findings in this area. but concerns for a soft tissue density in the distal descending colon and diverticulitis. Did have attempted colonoscopy 04/05/2013, but due to sharp angulations in the sigmoid colon and poor prep, the procedure was aborted. Of note, Dr. Vira Agar, the performing physician, was able to change the colonoscope for an upper endoscope and was able to pass into the sigmoid and part of the descending colon. There was no obvious mass noted at that time. Does have history of colon polyps. She is currently afebrile.   PAST MEDICAL HISTORY: Hypertension, mitral and aortic valve stenosis, hypothyroidism, diabetes, hyperlipidemia, proteinuria (follows with [facility]), CKD stage III, anemia, laparoscopic cholecystectomy, hysterectomy, laminectomy.   ALLERGIES: NKDA.  HOME MEDICATIONS: Acetaminophen/hydrocodone 325/5 q.6 p.r.n., Flagyl 500 mg b.i.d.,  ASA 81 mg p.o. daily, Micardis 80 mg p.o. daily, glimepiride 4 mg b.i.d., Lipitor 40 mg daily, metoprolol 25 mg ER daily, Norvasc 10 daily, Cipro 500 q.12 h., levothyroxine 75 mcg daily.   SOCIAL HISTORY: Smokes 2 to 3 cigarettes every day. No alcohol, illicits. Lives at home.   FAMILY HISTORY: Significant for coronary artery disease, hypertension.   REVIEW OF SYSTEMS: Ten systems reviewed. Significant for fatigue, improving weakness. Otherwise negative.   LABORATORY DATA: Most recent: Glucose 89, BUN 16, creatinine 1.9, sodium 137, potassium 4.4, GFR 32, calcium 7.7, total protein 7.3, albumin 2.9, total bilirubin 0.2, ALP 104, AST 21, ALT 22. B12 of 276. ESR 46. TSH 7.31. WBC 7.3, hemoglobin 10.1, hematocrit 31.8, platelet count 259. Red cells are normocytic with increased RDW.   IMAGING: As noted above.   PHYSICAL EXAMINATION: VITAL SIGNS: Most recent: Temperature 97.8, pulse 76, respiratory rate 18, blood pressure 93/58, SaO2 of 97% on room air.  GENERAL: Well-appearing, cheerful woman in no acute distress.  HEENT: Normocephalic, atraumatic. Sclerae clear.  NECK: Supple. No thyromegaly or JVD.  CHEST: Respirations eupneic. Lungs clear bilaterally.  CARDIOVASCULAR: S1, S2, RRR. No MRG.  ABDOMEN: Obese, soft abdomen, nondistended, nontender. Bowel sounds present x 4. No guarding, rigidity, rebound, tenderness, or other peritoneal signs.  EXTREMITIES: No clubbing or cyanosis. Strength 5 out of 5, equal movement.  NEUROLOGIC: Cranial nerves II through XII intact. Alert and oriented x 3. Speech clear. No facial droop.  SKIN: Warm, dry, pink.  PSYCHIATRIC: Pleasant, calm. Intact judgment and insight.   IMPRESSION AND PLAN: Diverticulitis, abnormal findings on CT scan concerning for mass. This is more likely an inflammatory process rather than a neoplastic one due to her recalcitrant  diverticulitis. Also, she is feeling better today. Agree with Zosyn. Would recommend surgical consult for  opinion. Will consider further evaluation with colonoscopy or other adjunctive means as she continues to improve.   Thank you very much for this consult.   These services were provided by Stephens November, MSN, [facility], in collaboration with Lollie Sails, MD, with whom I have discussed this patient in full.   ____________________________ Theodore Demark, NP chl:jcm D: 05/05/2013 16:19:49 ET T: 05/05/2013 16:38:53 ET JOB#: [id number]  cc: Theodore Demark, NP, <Dictator> [place] SIGNED 05/19/2013 10:51

## 2014-06-11 NOTE — Consult Note (Signed)
PATIENT NAME:  [name], GENTIL MR#:  [id number] DATE OF BIRTH:  1951-02-13  DATE OF CONSULTATION:  07/10/2013  CONSULTING PHYSICIAN:  Ceasar Lund. Anselm Jungling, MD  REQUESTING PHYSICIAN: Dr. Rexene Edison.  REASON FOR CONSULTATION: Hypoglycemia, hypertension.   HISTORY OF PRESENT ILLNESS: A 64 year old female with past medical history of morbid obesity, hypertension, moderate aortic stenosis, diabetes, chronic renal insufficiency with proteinuria, hypothyroidism; had  surgeries in the past because of diverticulitis and had sigmoidostomy. Came to hospital yesterday because of bleeding from the site, admitted to surgical service, kept n.p.o. and currently treated by conservative management by keeping her n.p.o. and monitoring her CBC, giving her antibiotics. This morning, her blood sugar was noted to be 57 so medical consult was called in for further management of her blood sugar as k also has diabetes. On further questioning, she denies any complaints. She takes glimepiride for her diabetes and the last time she took it was yesterday morning.  REVIEW OF SYSTEMS:  CONSTITUTIONAL: Negative for fever, fatigue, weakness, pain, or weight loss.  EYES: No blurring, double vision, discharge or redness.  EARS, NOSE, THROAT: No tinnitus, ear pain, or hearing loss.  RESPIRATORY: No cough, wheezing, hemoptysis, or shortness of breath.  CARDIOVASCULAR: No chest pain, orthopnea, edema, arrhythmia, or palpitations.  GASTROINTESTINAL: No nausea, vomiting, diarrhea, but has bleeding from the colostomy site. GENITOURINARY: No dysuria, hematuria, or increased frequency.  ENDOCRINE: No heat or cold intolerance. No excessive sweating.  NEUROLOGICAL: No numbness, weakness, tremor, or vertigo.  PSYCHIATRIC: No anxiety, insomnia, or bipolar disorder. MUSCULOSKELETAL: Joints: No swelling or tenderness.   PAST MEDICAL HISTORY:  1. Hypertension.  2. Hypothyroidism.  3. Diabetes mellitus.  4. Hyperlipidemia.  5.  Proteinuria.  6. Chronic kidney disease stage III.  7. Anemia.  8. Aortic valve stenosis.   PAST SURGICAL HISTORY:  1. Laparoscopic cholecystectomy.  2. Status post hysterectomy.  3. Cervical laminectomy. 4. Colostomy.  SOCIAL HISTORY: Smokes 2-3 cigarettes per day. Denies any history of alcohol or illegal drug use.   FAMILY HISTORY: Coronary artery disease and hypertension.   HOME MEDICATIONS:  1. Micardis p.o. daily.  2. Metoprolol 25 mg p.o. daily.  3. Lipitor 40 mg p.o. daily.  4. Levothyroxine 75 mcg p.o. daily.  5. Glimepiride 4 mg p.o. b.i.d.  6. Norco p.r.n. for pain.  PHYSICAL EXAMINATION: VITALS SIGNS: Temperature is 97.9, pulse 82, respirations 18, blood pressure 118/74. Pulse oximetry 95% on room air  GENERAL: The patient is fully alert and oriented to time, place, and person. Does not appear in any acute distress.  HEENT: Head and neck  atraumatic. Conjunctivae pink. Oral mucosa moist. NECK: Supple, no JVD.  RESPIRATORY: Bilateral equal and clear air entry.  CARDIOVASCULAR: S1, S2 present, regular. No murmur.  ABDOMEN: Soft, nontender. Bowel sounds present. Colostomy bag and draping  present. MUSCULOSKELETAL: Joints: No swelling or tenderness. Legs: No edema.  SKIN: No rashes.  NEUROLOGICAL: Power 5/5. Moves all 4 limbs. Follows commands.  PSYCHIATRIC: Does not appear in any acute psychiatric illness at this time.  IMPORTANT LABORATORY RESULTS: Glucose 97, BUN 14, creatinine 1.4. Sodium 140, potassium 4.1, chloride 106, CO2 of 25. Calcium is 8.5, lipase is 24.  albumin 2.6, alkaline phosphatase 90,. WBC 9, hemoglobin 8.4, platelet count is 314,000, MCV is 83. C. difficile is negative.   ASSESSMENT AND PLAN: A 64 year old female with multiple past medical history and colostomy secondary to small colonic perforation in the past came with bleeding around the colostomy site, admitted for conservative management by surgical  team. Medical consultation for hypoglycemia and  hypertension.  1. Hypoglycemia. Most likely this is result of her not eating anything and taking glimepiride yesterday morning. We will keep her n.p.o. as per surgical management but not give any diabetic medications. Currently blood sugar level is running around 100, so will continue monitoring every 4 hours without any covering insulin and nurse to call MD  if any bl sugar   less than 60. If blood sugar drops less than 60, then we might need to start her on D5 NS. Later on, if surgery permits to start her on feeding, then we can resume her oral hypoglycemic agents for diabetes.  2. Hypertension. Currently, she is n.p.o. so we will put her on hydralazine injection as needed for control of hypertension.  3. Bleeding from the colostomy site. Continue medication management as per surgery team. She is on antibiotics.  4. Hypothyroidism. Will continue levothyroxine.  5. Gastrointestinal prophylaxis. We will give Protonix IV.   TOTAL TIME SPENT IN THIS MEDICAL CONSULTATION: 45 minutes.    ____________________________ Ceasar Lund Anselm Jungling, MD vgv:lt D: 07/10/2013 17:38:29 ET T: 07/10/2013 21:09:09 ET JOB#: [id number]  cc: Ceasar Lund. Anselm Jungling, MD, <Dictator> Adin Hector, MD  Vaughan Basta MD ELECTRONICALLY SIGNED 07/13/2013 16:35

## 2014-06-11 NOTE — Consult Note (Signed)
Chief Complaint:  Subjective/Chief Complaint seen for abnormal CT, recalcitrant diverticulitis.  feeling better, tolerating clears/full liquids with bm this am.  stool loose. abdominal pain much improved.no nausea.   VITAL SIGNS/ANCILLARY NOTES: **Vital Signs.:   19-Mar-15 05:29  Temperature Temperature (F) 98.5  Celsius 36.9  Temperature Source oral  Pulse Pulse 65  Respirations Respirations 19  Systolic BP Systolic BP 97  Diastolic BP (mmHg) Diastolic BP (mmHg) 56  Mean BP 69  Pulse Ox % Pulse Ox % 92  Pulse Ox Activity Level  At rest  Oxygen Delivery Room Air/ 21 %   Brief Assessment:  GEN obese   Cardiac Regular   Respiratory clear BS   Gastrointestinal details normal Soft  Nontender  Nondistended  No masses palpable  Bowel sounds normal   Lab Results: Routine Chem:  19-Mar-15 04:39   Creatinine (comp)  2.19  eGFR (African American)  27  eGFR (Non-African American)  23 (eGFR values <37m/min/1.73 m2 may be an indication of chronic kidney disease (CKD). Calculated eGFR is useful in patients with stable renal function. The eGFR calculation will not be reliable in acutely ill patients when serum creatinine is changing rapidly. It is not useful in  patients on dialysis. The eGFR calculation may not be applicable to patients at the low and high extremes of body sizes, pregnant women, and vegetarians.)  Routine Hem:  19-Mar-15 04:39   Hematocrit (CBC)  31.8 (Result(s) reported on 06 May 2013 at 05:20AM.)   Radiology Results: CT:    18-Mar-15 09:20, CT Abdomen and Pelvis Without Contrast  CT Abdomen and Pelvis Without Contrast   REASON FOR EXAM:    (1) abdomen pain /recent abnl ct /use oral conmtrast   only; (2) pelvis/abd pain r  COMMENTS:       PROCEDURE: CT  - CT ABDOMEN AND PELVIS W0  - May 05 2013  9:20AM     CLINICAL DATA:  Follow-up abnormal CT.  Abdominal pain.    EXAM:  CT ABDOMEN AND PELVIS WITHOUT CONTRAST    TECHNIQUE:  Multidetector CT imaging  of the abdomen and pelvis was performed  following the standard protocol without intravenous contrast.  COMPARISON:  CT ABD-PELV W/O CM dated 04/28/2013; CT ABD-PELV W/O CM  dated 02/14/2011    FINDINGS:  Lung bases show no acute findings. Heart is mildly enlarged.  Decreased attenuation of the intravascular compartment is indicative  of anemia. No pericardial or pleural effusion.    Liver is unremarkable. Cholecystectomy. Right adrenal gland is  unremarkable. Question focal thickening of the left adrenal gland,  unchanged. Kidneys are unremarkable. Possible 8 mm calcified splenic  artery aneurysm, as before. Spleen, pancreas, stomach and small  bowel are otherwise unremarkable.    The colon is diffusely dilated and contrast filled to the level of  the descending sigmoid junction, where there is eccentric masslike  thickening (images 59-68). Minimal surrounding pericolonic haziness.  There are adjacent lymphnodes, measuring up to 9 mm (images 63, 64  and 67). Pathologically enlarged lymph nodes extend superiorly along  the anterior margin of the left psoas muscle, measuring up to 1.7 cm  (image 56). Possible peripancreatic lymph node in the region of the  pancreatic neck/head, measuring 11 mm (image 29). Gastrohepatic  ligament lymph nodes are not enlarged by CT size criteria. The  remainder of the rectosigmoid colon is decompressed and contains a  small amount of oral contrast.    Hysterectomy. Scattered atherosclerotic calcification of the  arterial vasculature without abdominal  aortic aneurysm. An omental  nodule or lymph node measures 6 mm (image 36), nonspecific. Small  periumbilical hernia contains fat. No worrisome lytic or sclerotic  lesions. Degenerative changes are seen in the sacroiliac joints and  spine.     IMPRESSION:  1. Findings are highly worrisome for an obstructing mass at the  junction of the descending and sigmoid colon, with pathologically  involved local  and regional lymph nodes, extending into the lower  left periaortic station. Resultant partial obstruction of the colon  in association, new from the prior exam. These results were called  by telephone at the time of interpretation on 05/05/2013 at 10:13 AM  to Dr. Ramonita Lab , who verbally acknowledged these results.  2. Possible peripancreatic lymph node. Attention on followup exams  is warranted, as this can be a site of lymph node metastasis from  colon carcinoma.  3. 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  Electronically Signed    By: Lorin Picket M.D.    On: 05/05/2013 10:14         Verified By: Luretha Rued, M.D.,   Assessment/Plan:  Assessment/Plan:  Assessment 1) diverticulitis, improved on iv zosyn.  agree with change to augmentin.  CT is worrisome for dilitation proximal to the lesion noted on the ct, and recent colonoscopy shows a very difficult place to pass the scope due to mechanical and narrowing issues.  Possibility of this being involved with chronic diverticulitis/scarring/fibrosis/stenosis.   Plan 1) continue current, if advancing diet change to low residue.  Further outpatient evaluation after completion of abx to consider possible surgical proceedure. Discussed with Dr Caryl Comes.   Electronic Signatures: Loistine Simas (MD)  (Signed 19-Mar-15 10:36)  Authored: Chief Complaint, VITAL SIGNS/ANCILLARY NOTES, Brief Assessment, Lab Results, Radiology Results, Assessment/Plan   Last Updated: 19-Mar-15 10:36 by Loistine Simas (MD)

## 2014-06-11 NOTE — H&P (Signed)
PATIENT NAME:  [name] MR#:  [id number] DATE OF BIRTH:  05/13/1950  DATE OF ADMISSION:  07/09/2013  ATTENDING PHYSICIAN: Dr. Marlyce Huge.   REASON FOR CONSULTATION: Blood from ostomy.   HISTORY OF PRESENT ILLNESS: [name] is a pleasant 64 year old female who was admitted in March for a micro right colon perforation secondary to distention from a sigmoid colonic diverticular stricture who underwent primary right colon repair and sigmoid colectomy. I had seen her quite frequently for a wound issue that she had developed postoperatively. According to her, she has had 1 day of bleeding, approximately 200 mL of bleeding from her ostomy since she has been in the ED and after ingesting PO contrast she has had significantly more. Otherwise has been doing fine, did have an episode of diarrhea last week which had resolved on its own, and had been undergoing wound care for wound issues. Otherwise, no fevers, chills, night sweats, shortness of breath, cough, abdominal pain, nausea, vomiting, dysuria or hematuria. No unusual ingestions. No unusual sick contacts.   PAST MEDICAL HISTORY 1. Diverticulitis status post diverticular stricture status post exploratory laparoscopy, right colon repair, a microperforation, and Hartmann's procedure.  3. Postoperative wound infection requiring care.  4. Diabetes mellitus.  5. Psoriasis.  6. Hypertension.  7. History of a back spur.  8. Cholecystectomy.  9. History of left shoulder with nerve release.  10. History of total hysterectomy.   HOME MEDICATIONS: Micardis  p.o. daily, metoprolol 25 mg p.o. daily, Lipitor 40 mg p.o. daily, levothyroxine 75 mcg p.o. daily, glimepiride 4 mg p.o. b.i.d., Norco p.r.n. pain.   ALLERGIES: No known drug allergies.   SOCIAL HISTORY: Denies tobacco or alcohol use.   FAMILY HISTORY: Coronary artery disease and hypertension.   REVIEW OF SYSTEMS: A 12-point review of systems was obtained. Pertinent positives and  negatives as above.   PHYSICAL EXAMINATION:  VITAL SIGNS: Temperature 98.2, pulse 64, and blood pressure 145/83 and 18 breaths per minute, 98% on room air.  GENERAL: No acute distress. Alert and oriented x 3 slightly anxious.  HEAD: Normocephalic, atraumatic.  HEENT: Eyes: No scleral icterus. No conjunctivitis. Face: No obvious facial trauma. Normal external nose. Normal external ears.  CHEST: Lungs clear to auscultation. Moving air well.  HEART: Regular rate and rhythm. No murmurs, rubs, or gallops.  ABDOMEN: Soft, nontender, nondistended. Does have clots around ostomy. No obvious bleeding from around ostomy or from actual ostomy.  EXTREMITIES: Moves all extremities well. Strength 5/5.  NEUROLOGIC: Cranial nerves II-XII are grossly intact.   LABORATORY DATA: Significant for white cell count of 10.2 at discharge. In late March it was 8.9. Platelets are 343,000, creatinine is 1.41, potassium is 4.1. CT shows a midline wound with packing, circumferential wall thickening of short segment of colon, its hepatic flexure with pericolonic fat stranding suggestive of focal colitis.   ASSESSMENT AND PLAN: [name] is a pleasant 64 year old who presents with bleeding from ostomy, has a thickened area of colon concerning for colitis. Etiology of bleed is not unknown but may be due to an infectious or ischemic colitis although this is a very unusual area to have bleeding. Will admit for resuscitation and monitoring of gastrointestinal bleed. We will consult GI for assistance with management and localization, hemodynamically stable. No obvious intervention needed at this time. We will consider type red blood cell scan as well. I have discussed the plan with patient and she agrees with this plan.    ____________________________ Glena Norfolk. Dartanyon Frankowski, MD cal:lt D:  07/09/2013 20:18:53 ET T: 07/09/2013 21:04:36 ET JOB#: [id number]  cc: Harrell Gave A. Roniqua Kintz, MD, <Dictator> Floyde Parkins  MD ELECTRONICALLY SIGNED 07/10/2013 6:39

## 2014-06-11 NOTE — Consult Note (Signed)
Chief Complaint:  Subjective/Chief Complaint Please see full  GI consult and brief consult note.  Patietn seen and examined. CT results noted with likely inflammatory mass in the distal descending/proximal sigmoid.  Low suspicion for malignancy.  Of note, CT shows proximal distension and distal narrowing form the lesion.  She has a history of at least 2 other bouts of diverticulitis, one going back to her 20's.  I suspect from the difficulty in a colonoscopy attempt last month that she has scarring/narrowing/ fibrosis that may need possible surgical interventiion.  Currently she is feeling much better after change of abx to Zosyn.  Appreciate surgical consult.  Continue current.  Following.   Electronic Signatures: Loistine Simas (MD)  (Signed 18-Mar-15 18:28)  Authored: Chief Complaint   Last Updated: 18-Mar-15 18:28 by Loistine Simas (MD)

## 2014-06-11 NOTE — Consult Note (Signed)
PATIENT NAME:  [name], [name] MR#:  [id number] DATE OF BIRTH:  [birth year]  DATE OF CONSULTATION:  05/09/2013  REFERRING PHYSICIAN:   CONSULTING PHYSICIAN:  Nicholes Mango, MD  ATTENDING PHYSICIAN: Dr. Marina Gravel.   REASON FOR MEDICAL CONSULT: Medical management.   HISTORY OF PRESENT ILLNESS: The patient is a 64 year old female with past medical history of morbid obesity, hypertension, moderate aortic stenosis, diabetes mellitus, chronic renal insufficiency, stage IV with proteinuria, hypothyroidism is brought into the ER after she was just discharged from the hospital from [facility] for diverticulitis. The patient is coming with severe diffuse abdominal pain. The patient was just admitted to the hospital on 03/17 for weakness, nausea, vomiting and abdominal pain and diagnosed with diverticulitis. As the patient has a perforated viscus, the patient was admitted to Dr. Algernon Huxley service. The patient was diagnosed with perforated right colon, underwent exploratory laparotomy, colectomy, colostomy and partial omentectomy. The patient is postoperatively transferred to Critical Care Unit. The patient is still intubated. Hospitalist team is called regarding medical consult. As the patient is intubated, I was unable to get any history from the patient.  I have  discussed with the patient's daughter who is waiting in the waiting room. According to her she has hypertension, hypothyroidism, but no heart problems. She has chronic history of diabetes mellitus and hyperlipidemia. The patient is currently intubated under anesthesia.   PAST MEDICAL HISTORY: Hypertension, hypothyroidism and diabetes mellitus, hyperlipidemia, proteinuria, followed by [facility] nephrology, chronic kidney disease stage III, anemia,  aortic valve stenosis.   PAST SURGICAL HISTORY: Laparoscopic cholecystectomy, status post hysterectomy, cervical laminectomy.   ALLERGIES: No known drug allergies.   PSYCHOSOCIAL HISTORY: Smokes 2 to  3 cigarettes per day. Denies any history of alcohol or illicit drug use according to the old records.   FAMILY HISTORY: Coronary artery disease, hypertension.   HOME MEDICATIONS: Metoprolol 25 mg p.o. once a day, Lipitor 40 mg p.o. once daily, levothyroxine 75 mcg 1 tablet p.o. once daily. Glimepiride 4 mg 1 tablet p.o. 2 times a day, amoxicillin clavulanate 500 mg orally 2 times a day, Tylenol 1 tablet p.o. every six hours as needed.   REVIEW OF SYSTEMS: Unobtainable.   PHYSICAL EXAMINATION: VITAL SIGNS: Temperature 98.2, pulse 107, pulse oximetry is 99%. Blood pressure is 137/69, currently intubated.  GENERAL APPEARANCE: Not in acute distress. The patient is sedated.  HEENT: Normocephalic, atraumatic. Pupils are equally reacting to light and accommodation but sluggish. Moist mucous membranes. ET tube is intact. NECK: Supple. No JVD. No goiter.  LUNGS: Moderate air entry. Decreased breath sounds at the bases.  CARDIAC: S1, S2 normal. Regular rate and rhythm. Positive murmur. GASTROINTESTINAL: Soft, status post exploratory laparotomy. Abdominal bandage is intact in the colostomy bag.  NEUROLOGICAL: The patient is sedated not arousable as still under anesthesia affect. Motor and sensory nerves could not be elicited.  EXTREMITIES: Trace edema. No cyanosis. No clubbing.  SKIN: Warm to touch. Normal turgor. No rashes. No lesions.  MUSCULOSKELETAL: No joint effusion noticed. No erythema.  PSYCHIATRIC: Mood and affect could not be elicited.  LABORATORIES AND IMAGING STUDIES: Last Accu-Chek is 132, BUN 16, creatinine 1.91, sodium 140, potassium 4.2, chloride 107, CO2 28. GFR 27, anion gap 5, serum osmolality 286, calcium 8.5. LFTs: Total protein 7.2, albumin 2.7, bilirubin total 0.3, alkaline phosphatase 111, AST 13, ALT 20.    CBC: WBC 8.1, hemoglobin 11.5, hematocrit 34.6, platelets are 318.   Urinalysis: Straw-colored, hazy in appearance, glucose 50. ketones negative, specific 1.006, nitrites  are negative, leukocyte esterase 1+.  ABG: PH 7.40, pCO2 33, FiO2 of 50%, bicarbonate is 20.4, base excess -3.6.   ASSESSMENT AND PLAN: A 64 year old Caucasian female brought into the ER with severe diffuse abdominal pain, just discharged from [facility] yesterday after treatment for left-sided diverticulitis, diagnosed with perforated diverticula, perforated right colon,  status post surgery. Medical consultation placed  for medical management.  1. Exploratory laparotomy status post colectomy, partial omentectomy and colostomy. Currently intubated. ABG ordered for a.m. Pain management per Dr. Marina Gravel. Continue antibiotics, Rocephin and Flagyl. The patient will be sedated with propofol soon.  2. Diabetes mellitus. The patient will be on sliding scale insulin.  3. Hypertension. We will provide her IV Lopressor for the blood pressure control as needed basis.  4. Chronic renal insufficiency. The patient is on IV fluids, avoid nephrotoxins, monitor renal function closely.  5. Hypothyroidism: The patient was taking Synthroid 75 mcg. We will continue 37.5 mcg IV Synthroid  while the patient is n.p.o. and intubated.  6. History of aortic stenosis. She is on telemetry.  7. Possible urinary tract infection per urinalysis. The patient is IV Rocephin.   CODE STATUS: SHE IS FULL CODE.   We will provide her gastrointestinal prophylaxis with Protonix and deep vein thrombosis prophylaxis with SCDs. Plan of care discussed with the patient's daughter in the waiting room. She verbalized understanding of the plan.   TOTAL CRITICAL CARE TIME SPENT: 50 minutes.  Thank you Dr. Marina Gravel for allowing prime doc to take care of the patient. The patient will be transferred to Dr. Ramonita Lab in the a.m.   ____________________________ Nicholes Mango, MD ag:sg D: 05/09/2013 04:03:37 ET T: 05/09/2013 08:39:11 ET JOB#: [id number]  cc: Nicholes Mango, MD, <Dictator> Nicholes Mango MD ELECTRONICALLY SIGNED 05/25/2013  7:31

## 2014-06-11 NOTE — Op Note (Signed)
PATIENT NAME:  ANGELO, DURDEN MR#:  [id number] DATE OF BIRTH:  03-20-1950  DATE OF PROCEDURE:  06/17/2013  PREOPERATIVE DIAGNOSIS:  Postoperative wound infection with necrotic tissue.   POSTOPERATIVE DIAGNOSIS:  Postoperative wound infection with necrotic tissue.  PROCEDURE PERFORMED:  Debridement of a large sacral tunneling wound measuring 14 x 4 x 2 cm and 3 x 3 x 1 cm to his placement of wound VAC.   ANESTHESIA:  General.   ESTIMATED BLOOD LOSS:  10 mL.   COMPLICATIONS:  None.   SPECIMENS:  None.   INDICATIONS FOR PROCEDURE: Ms. Damboise is a pleasant 64 year old, who recently underwent a sigmoid colectomy with end colostomy. She was noted to have persistent wound drainage and necrosis. I, thus, brought her to the operating room for debridement of wound and placement of wound VAC.   DETAILS OF PROCEDURE: Are as follows:  Informed consent was obtained. Ms. Ruddick was induced, endotracheal tube was placed, general anesthesia was administered. Her abdomen was prepped and draped in a standard surgical fashion. A timeout was then performed correctly identifying the patient name, operative site and the procedure performed. I then proceeded to examine her upper wound. It appeared to tunneling up. There was a significant amount of necrotic tissue. At the base of the opening, there was some granulation tissue there with 4 x 3 cm wound. I then placed a finger into the cavity and noticed that it went up and tunneled to a small punctate area at the superior aspect, approximately 14 cm away. I then made a counterincision through this area and placed a small Penrose drain, which was sutured in place with 3-0 nylon. I then debrided all tissue down to the fascia. I then placed a white sponge in the tract and then a black sponge above it, and also placed a black sponge through the opening at the superior aspect. I then placed the wound VAC apparatus. There was good seal. There was an inferior, small granulated area  which tunneled as well. I opened up the tract and debrided this as well.  The total wound after this was 3 x 3 x 1 cm. I then placed the wound VAC over this wound and connected it to the upper wound VAC with a small bridge, There was good seal. There were no immediate complications. Drapes were then taken down. The patient was awoken, extubated and brought to the postanesthesia care unit. Needle, sponge and instrument count was correct at the end of the procedure.    ____________________________ Glena Norfolk. Carmello Cabiness, MD cal:dmm D: 06/18/2013 10:32:47 ET T: 06/18/2013 10:56:59 ET JOB#: [id number]  cc: Harrell Gave A. Dianne Whelchel, MD, <Dictator> Floyde Parkins MD ELECTRONICALLY SIGNED 06/19/2013 8:36

## 2014-06-11 NOTE — Discharge Summary (Signed)
PATIENT NAME:  [name], [name] MR#:  [id number] DATE OF BIRTH:  1950/03/06  DATE OF ADMISSION:  05/09/2013 DATE OF DISCHARGE:  05/17/2013  DISCHARGE DIAGNOSES:  1.  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  2.  History of diverticulitis.  3.  History of obesity.  4.  Aortic stenosis.  5.  Diabetes mellitus. 6.  Hypertension. 7.  Hyperlipidemia.  DISCHARGE MEDICATIONS ARE AS FOLLOWS: Micardis 80 mg 1 tab p.o. daily, L-thyroxine 75 mcg p.o. daily, metoprolol 25 mg p.o. daily, Lipitor 50 mg p.o. at bedtime, glimepiride 4 mg p.o. b.i.d., Norco 1 tab p.o. q. 4 hours p.r.n. pain, Flagyl 500 mg p.o. q. 8 hours x 7 days, ciprofloxacin 500 mg p.o. b.i.d. x 7 days.   PROCEDURES PERFORMED: 1. Exploratory laparotomy.  2. Closure of cecal perforation. 3. Sigmoid colectomy with end colostomy.  INDICATION FOR ADMISSION AS FOLLOWS: Ms. [name] is a pleasant, 64 year old who presented with free air following a perforation, likely due to cecal distention from downstream diverticular stricture. She was admitted for management of this issue.   HOSPITAL COURSE: Ms. [name] was brought to the Operating Room suite. She underwent sigmoid colectomy with primary repair of a colon injury. She then was admitted, made initially n.p.o. and given IV fluids. Her diet was advanced. Her ostomy output increased. She also was transitioned from IV to p.o. antibiotics for 14 days total course, as well as IV to oral pain meds. At time of discharge, she was taking good p.o., with good p.o. pain control. She was voiding and stooling without difficulties.   DISCHARGE INSTRUCTIONS ARE AS FOLLOWS:  Ms. [name] is to follow up with Dr. Marina Gravel or myself in approximately one week to ensure she is continuing to improve. She is to call or return to the ED if she has increased pain, nausea, vomiting, redness, drainage from incision.    ____________________________ Glena Norfolk Cerise Lieber, MD cal:mr D: 05/16/2013  09:02:00 ET T: 05/16/2013 18:40:40 ET JOB#: [id number]  cc: Harrell Gave A. Kalea Perine, MD, <Dictator> Floyde Parkins MD ELECTRONICALLY SIGNED 05/19/2013 12:27

## 2014-06-11 NOTE — Consult Note (Signed)
Brief Consult Note: Diagnosis: Hypoglycemia.   Patient was seen by consultant.   Recommend further assessment or treatment.   Orders entered.   Comments: Monitor blood sugar without coverage every 4 hrs today- likley cause of hypoglycemia- is possible infection and Keeping NPO. last dose of glimiperide was yesterday morning as per pt. If Blood sugar drops again- may need to be started on D5NS drip.  For HTN- started on IV hydralazine PRN - as she is NPO now. Will continue following.  Electronic Signatures: Vaughan Basta (MD)  (Signed 23-May-15 14:07)  Authored: Brief Consult Note   Last Updated: 23-May-15 14:07 by Vaughan Basta (MD)

## 2014-06-11 NOTE — H&P (Signed)
Subjective/Chief Complaint severe diffuse abdominal pain   History of Present Illness 64 y/o female just discharged from [facility] yesterday with left sided diverticulitis and LBO for which she was treated with iv abx and pain meds,  seen by surgery who felt she was feeling better and improving.  She awoke this am with severe acute onset diffuse abdominal pain in all quatrants.  Last passed flatus several days ago.  small bowle movement in hospital which she stated made her feel better.   Past History obesity AS diverticulitis diabetes hypertension hyperlipedemia.   Past Med/Surgical Hx:  Diverticulitis:   DM:   rapid heart rate:   psoriasis:   Hypertension:   spur removed from lower back:   Cholecystectomy:   pinched nerve released in left shoulder:   Hysterectomy - Total:   ALLERGIES:  No Known Allergies:    Other Allergies none   HOME MEDICATIONS: Medication Instructions Status  polyethylene glycol 3350 oral powder for reconstitution 17 gram(s) orally once a day, As needed, constipation Active  amoxicillin-clavulanate 500 milligram(s) orally 2 times a day x 8 days Active  acetaminophen-HYDROcodone 325 mg-5 mg tablet 1 tab(s) orally every 6 hours, As Needed- ,for Pain Active  Micardis 80 mg oral tablet 1 tab(s) orally once a day Active  levothyroxine 75 mcg (0.075 mg) oral tablet 1 tab(s) orally once a day Active  metoprolol 25 mg oral tablet, extended release 1 tab(s) orally once a day Active  Lipitor 40 mg oral tablet 1 tab(s) orally once a day (at bedtime) Active  glimepiride 4 mg oral tablet 1 tab(s) orally 2 times a day Active   Family and Social History:  Family History Non-Contributory   Social History positive  tobacco, negative ETOH   + Tobacco Current (within 1 year)   Place of Living Home   Review of Systems:  Subjective/Chief Complaint see above.   Abdominal Pain Yes   Constipation Yes   Nausea/Vomiting Yes   Telemetry Reviewed NSR   Dysuria No    Tolerating Diet No  Nauseated   Physical Exam:  GEN obese, disheveled, critically ill appearing   HEENT pale conjunctivae, PERRL   NECK supple   RESP clear BS   CARD regular rate  murmur present  no thrills  no carotid bruits  no JVD   ABD positive tenderness  no hernia  distended  hypoactive BS  lower midline scar, diffuse peritonitis findings.   LYMPH negative neck   EXTR negative cyanosis/clubbing   SKIN normal to palpation   NEURO cranial nerves intact   PSYCH A+O to time, place, person, good insight   Lab Results: Hepatic:  21-Mar-15 14:10   Bilirubin, Total 0.3  Alkaline Phosphatase 111 (45-117 NOTE: New Reference Range 01/08/13)  SGPT (ALT) 20  SGOT (AST)  13  Total Protein, Serum 7.2  Albumin, Serum  2.7  Routine Chem:  21-Mar-15 14:10   Glucose, Serum  187  BUN 16  Creatinine (comp)  1.91  Sodium, Serum 140  Potassium, Serum 4.0  Chloride, Serum 107  CO2, Serum 28  Calcium (Total), Serum 8.5  Osmolality (calc) 286  eGFR (African American)  32  eGFR (Non-African American)  27 (eGFR values <3m/min/1.73 m2 may be an indication of chronic kidney disease (CKD). Calculated eGFR is useful in patients with stable renal function. The eGFR calculation will not be reliable in acutely ill patients when serum creatinine is changing rapidly. It is not useful in  patients on dialysis. The eGFR calculation may  not be applicable to patients at the low and high extremes of body sizes, pregnant women, and vegetarians.)  Anion Gap  5  Routine UA:  21-Mar-15 14:10   Color (UA) Straw  Clarity (UA) Hazy  Glucose (UA) 50 mg/dL  Bilirubin (UA) Negative  Ketones (UA) Negative  Specific Gravity (UA) 1.006  Blood (UA) 2+  pH (UA) 6.0  Protein (UA) 100 mg/dL  Nitrite (UA) Negative  Leukocyte Esterase (UA) 1+ (Result(s) reported on 08 May 2013 at 02:34PM.)  RBC (UA) 5 /HPF  WBC (UA) 4 /HPF  Bacteria (UA) TRACE  Epithelial Cells (UA) 2 /HPF  Mucous (UA)  PRESENT (Result(s) reported on 08 May 2013 at 02:34PM.)  Routine Hem:  21-Mar-15 14:10   WBC (CBC) 8.1  RBC (CBC) 4.18  Hemoglobin (CBC)  11.5  Hematocrit (CBC)  34.6  Platelet Count (CBC) 318  MCV 83  MCH 27.6  MCHC 33.2  RDW  14.8  Neutrophil % 76.9  Lymphocyte % 16.6  Monocyte % 4.7  Eosinophil % 1.2  Basophil % 0.6  Neutrophil # 6.3  Lymphocyte # 1.3  Monocyte # 0.4  Eosinophil # 0.1  Basophil # 0.1 (Result(s) reported on 08 May 2013 at 02:32PM.)   Radiology Results: XRay:    21-Mar-15 20:26, Abdomen Flat and Erect  Abdomen Flat and Erect  REASON FOR EXAM:    abdominal pain possible obstruction  COMMENTS:       PROCEDURE: DXR - DXR ABDOMEN 2 V FLAT AND ERECT  - May 08 2013  8:26PM     CLINICAL DATA:  Abdominal pain.  Colonic obstruction on CT    EXAM:  ABDOMEN - 2 VIEW    COMPARISON:  CT ABD-PELV W/O CM dated 05/05/2013; CT ABD-PELV W/O CM  dated 04/28/2013    FINDINGS:  Dilated loops of colon extending to the level of the descending  colon. . Findings are consistent distal colonic obstruction. These  findings are confirmed on comparison CT of 05/05/2013 with an  obstructing masses in the descending colon.    There is free air beneath the left and right hemidiaphragm. This is  consistent with bowel perforation.     IMPRESSION:  1. Distended colon secondary to obstructing mass anddescending  colon.  2. New intraperitoneal free air most consistent bowel perforation.  Critical Value/emergent results were called by telephone at the time  of interpretation on 05/08/2013 at 8:46 PM to Dr. Marjean Donna ,  who verbally acknowledgedthese results.  Electronically Signed    By: Suzy Bouchard M.D.    On: 05/08/2013 20:46         Verified By: Rennis Golden, M.D.,  LabUnknown:    18-Mar-15 09:20, CT Abdomen and Pelvis Without Contrast  PACS Image    21-Mar-15 20:26, Abdomen Flat and Erect  PACS Image  CT:    18-Mar-15 09:20, CT Abdomen and Pelvis Without  Contrast  CT Abdomen and Pelvis Without Contrast  REASON FOR EXAM:    (1) abdomen pain /recent abnl ct /use oral conmtrast   only; (2) pelvis/abd pain r  COMMENTS:       PROCEDURE: CT  - CT ABDOMEN AND PELVIS W0  - May 05 2013  9:20AM     CLINICAL DATA:  Follow-up abnormal CT.  Abdominal pain.    EXAM:  CT ABDOMEN AND PELVIS WITHOUT CONTRAST    TECHNIQUE:  Multidetector CT imaging of the abdomen and pelvis was performed  following the standard protocol without intravenous contrast.  COMPARISON:  CT ABD-PELV W/O CM dated 04/28/2013; CT ABD-PELV W/O CM  dated 02/14/2011    FINDINGS:  Lung bases show no acute findings. Heart is mildly enlarged.  Decreased attenuation of the intravascular compartment is indicative  of anemia. No pericardial or pleural effusion.    Liver is unremarkable. Cholecystectomy. Right adrenal gland is  unremarkable. Question focal thickening of the left adrenal gland,  unchanged. Kidneys are unremarkable. Possible 8 mm calcified splenic  artery aneurysm, as before. Spleen, pancreas, stomach and small  bowel are otherwise unremarkable.    The colon is diffusely dilated and contrast filled to the level of  the descending sigmoid junction, where there is eccentric masslike  thickening (images 59-68). Minimal surrounding pericolonic haziness.  There are adjacent lymphnodes, measuring up to 9 mm (images 63, 64  and 67). Pathologically enlarged lymph nodes extend superiorly along  the anterior margin of the left psoas muscle, measuring up to 1.7 cm  (image 56). Possible peripancreatic lymph node in the region of the  pancreatic neck/head, measuring 11 mm (image 29). Gastrohepatic  ligament lymph nodes are not enlarged by CT size criteria. The  remainder of the rectosigmoid colon is decompressed and contains a  small amount of oral contrast.    Hysterectomy. Scattered atherosclerotic calcification of the  arterial vasculature without abdominal aortic aneurysm.  An omental  nodule or lymph node measures 6 mm (image 36), nonspecific. Small  periumbilical hernia contains fat. No worrisome lytic or sclerotic  lesions. Degenerative changes are seen in the sacroiliac joints and  spine.     IMPRESSION:  1. Findings are highly worrisome for an obstructing mass at the  junction of the descending and sigmoid colon, with pathologically  involved local and regional lymph nodes, extending into the lower  left periaortic station. Resultant partial obstruction of the colon  in association, new from the prior exam. These results were called  by telephone at the time of interpretation on 05/05/2013 at 10:13 AM  to Dr. Ramonita Lab , who verbally acknowledged these results.  2. Possible peripancreatic lymph node. Attention on followup exams  is warranted, as this can be a site of lymph node metastasis from  colon carcinoma.  3. 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  Electronically Signed    By: Lorin Picket M.D.    On: 05/05/2013 10:14         Verified By: Luretha Rued, M.D.,    Assessment/Admission Diagnosis Perforated diverticulitis with clinical history and films consistent with distal large bowel obstruction. Morbid obesity modertae aortic stenosis moderately high risk for surgical complications.   Plan Needs urgent ex lap and colostomy and colectomy. I explained to her and significant other complicated nature of her problem and the possibility of infection, infection ostomy complications.   Electronic Signatures: Sherri Rad (MD)  (Signed 21-Mar-15 21:33)  Authored: CHIEF COMPLAINT and HISTORY, PAST MEDICAL/SURGIAL HISTORY, ALLERGIES, Other Allergies, HOME MEDICATIONS, FAMILY AND SOCIAL HISTORY, REVIEW OF SYSTEMS, PHYSICAL EXAM, LABS, Radiology, ASSESSMENT AND PLAN   Last Updated: 21-Mar-15 21:33 by Sherri Rad (MD)

## 2014-06-11 NOTE — Consult Note (Signed)
GI Note: done today for LGIB.   diverticulosis, including several large diverticulae in the HF. this was the source of bleeding. No other abnormalities. bleeding has clearly stopped at this point.  monitor Hgb until stable, then likely d/c. PO ferrous sulfate 325 BID. Hbg check as outpt.   Electronic Signatures: Arther Dames (MD)  (Signed on 29-May-15 12:24)  Authored  Last Updated: 29-May-15 12:24 by Arther Dames (MD)

## 2014-06-11 NOTE — Consult Note (Signed)
Chief Complaint:  Subjective/Chief Complaint Pt notes ostomy bleeding has decreased signifcantly.  Denies abdominal pain, nausea or vomiting.   VITAL SIGNS/ANCILLARY NOTES: **Vital Signs.:   25-May-15 12:19  Vital Signs Type Q 4hr  Temperature Temperature (F) 97.6  Celsius 36.4  Temperature Source oral  Pulse Pulse 70  Respirations Respirations 18  Systolic BP Systolic BP 123XX123  Diastolic BP (mmHg) Diastolic BP (mmHg) 83  Mean BP 94  Pulse Ox % Pulse Ox % 96  Pulse Ox Activity Level  At rest  Oxygen Delivery Room Air/ 21 %   Brief Assessment:  GEN well developed, no acute distress, obese, A/Ox3   Cardiac Regular   Respiratory normal resp effort   Gastrointestinal details normal Soft  Nontender  Bowel sounds normal  +about 15cc burgundy blood in bag   EXTR negative cyanosis/clubbing, negative edema   Additional Physical Exam Skin: warm, dry   Lab Results: Routine Hem:  25-May-15 04:46   Hemoglobin (CBC)  8.2  WBC (CBC) 7.4  RBC (CBC)  2.99  Hematocrit (CBC)  25.2  Platelet Count (CBC) 227  MCV 84  MCH 27.4  MCHC 32.4  RDW  16.1  Neutrophil % 68.6  Lymphocyte % 19.8  Monocyte % 7.6  Eosinophil % 3.4  Basophil % 0.6  Neutrophil # 5.1  Lymphocyte # 1.5  Monocyte # 0.6  Eosinophil # 0.3  Basophil # 0.0 (Result(s) reported on 12 Jul 2013 at 05:13AM.)    12:22   Hemoglobin (CBC)  7.2 (Result(s) reported on 12 Jul 2013 at 12:53PM.)   Assessment/Plan:  Assessment/Plan:  Assessment Persistent LGI bleed:  Decreased bleeding from ostomy today. Anemia:  Hgb continues to decline.   Plan 1) If heavy bleeding continues, tagged RBC scan and emolization 2) monitor H/H 3) continue supportive measures [facility] to resume care tomorrow Please call if you have any questions or concerns   Electronic Signatures: Andria Meuse (NP)  (Signed 25-May-15 13:57)  Authored: Chief Complaint, VITAL SIGNS/ANCILLARY NOTES, Brief Assessment, Lab Results,  Assessment/Plan   Last Updated: 25-May-15 13:57 by Andria Meuse (NP)

## 2014-06-11 NOTE — Consult Note (Signed)
Brief Consult Note: Diagnosis: diverticulitis.   Patient was seen by consultant.   Consult note dictated.   Comments: Appreciate consult for 64 y/o [place] American woman with complex health history, admitted with diverticulitis not responsive to cipro/flagyl therapy that was started on 3/11. Has been changed to Zosyn yesterday and says she is feeling much better: NV better, less weakness, and less pain- which was starting in the suprapubic area and radiated upwards. Had a "good" bowel movement today without signs of blood or blackness and denies further GI complaints. Had CT today with findings of diverticulitis in the descending colon, as well as findings concerning for a nearly obstructing mass in that region. Patient also had CT 3/11 with no mass like findings in that area, but concerns for soft tissue density in the distal descending colon and diverticulitis. Did have attempted colonoscopy last month but due to sharp angulation in sigmoid colon and poor prep, the procedure was aborted. Of note, Dr Tiffany Kocher was able to pass upper endoscope into the sigmoid/part of descending colon and there was no obvious mass noted at that time. Does have history of colon polyps. Afebrile and normal wbc. Impression and plan: Diverticulitis. Abnormal findings on CT scan concerning for mass: this is more likely and inflammatory process rather than a neoplastic one, due to her recalcitrant diverticulitis. Feeling better today. Agree with Zosyn. Would recommend surgical consult for opinion. WIll consider further evaluation with colonoscopy or other adjunctive means as she continues to improve..  Electronic Signatures: Stephens November H (NP)  (Signed 18-Mar-15 16:13)  Authored: Brief Consult Note   Last Updated: 18-Mar-15 16:13 by Theodore Demark (NP)

## 2014-06-11 NOTE — H&P (Signed)
PATIENT NAME:  [name], [name] MR#:  [id number] DATE OF BIRTH:  June 10, 1950  DATE OF ADMISSION:  05/04/2013  REFERRING PHYSICIAN: Dr. Janice Norrie  PRIMARY CARE PHYSICIAN: Dr. Ramonita Lab  CHIEF COMPLAINT: Weakness, nausea, vomiting, abdominal pain.   HISTORY OF PRESENT ILLNESS: This is a 64 year old female with known past medical history of morbid obesity, hypertension, moderate aortic stenosis, diabetes mellitus, proteinuria and chronic kidney disease with baseline creatinine around 1.5 who presents with complaints of abdominal pain, nausea, vomiting and weakness. The patient reports she was recently here last week where she had CT abdomen and pelvis which did show evidence of chronic diverticulosis with mild pericolonic inflammatory changes involving the proximal descending colon concerning for mild diverticulitis. The patient was afebrile. She was sent home on p.o. Cipro and Flagyl. The patient presents today with complaints of mild abdominal pain, feeling weak. Reports her generalized weakness has been going on for a few months, but worsened over the last few days. As well, reports she has decreased p.o. intake. As well, the patient had complaints of nausea and had episode of vomiting in the ED. She was afebrile with no leukocytosis. The patient had CT head without contrast due to her generalized weakness without any acute findings. [name] creatinine was found to be at 1.7, which is an improvement from 1.9 from previous value. Hospitalists were requested to admit the patient for her weakness, nausea and vomiting.   PAST MEDICAL HISTORY: 1.  Hypertension.  2.  Mitral aortic valve stenosis.  3.  Hypothyroidism.  4.  Diabetes mellitus.  5.  Hyperlipidemia.  6.  Proteinuria, followed by [facility] nephrology.  7.  Chronic kidney disease, stage III.  8.  Anemia.   PAST SURGICAL HISTORY:  1.  Laparoscopic cholecystectomy, 2003. 2.  Status post hysterectomy.  3.  Cervical laminectomy.    ALLERGIES: No known drug allergies.   HOME MEDICATIONS: 1.  Flagyl 500 mg 2 times a day.  2.  Acetaminophen/hydrocodone 325/5 every 6 hours as needed.  3.  Aspirin 81 mg daily.  4.  Micardis 80 mg daily.  5.  Glimepiride 4 mg oral 2 times a day.  6.  Lipitor 40 mg oral daily.  7.  Metoprolol 25 mg extended release oral daily. 8.  Norvasc 10 mg oral daily.  9.  Augmentin 875 one tablet oral every 12 hours.  10.  Cipro 500 mg oral every 12 hours.  11.  Levothyroxine 75 mcg oral daily.   SOCIAL HISTORY: The patient smokes 2 to 3 cigarettes every day. No alcohol. No illicit drug use.   FAMILY HISTORY: Significant for coronary artery disease and hypertension.   REVIEW OF SYSTEMS: CONSTITUTIONAL: The patient complains of fatigue, weakness. Denies fever, chills, weight gain, weight loss.  EYES: Denies blurry vision, double vision, inflammation, glaucoma. ENT: Denies tinnitus, ear pain, hearing loss, epistaxis or discharge.  RESPIRATORY: Denies cough, wheezing, hemoptysis, shortness of breath.  CARDIOVASCULAR: Denies chest pain, edema, arrhythmia, palpitation, syncope.  GASTROINTESTINAL: Complains of nausea and vomiting, mild abdominal pain and constipation. Denies melena, coffee-ground emesis, jaundice.  GENITOURINARY: Denies dysuria, hematuria, renal colic.  ENDOCRINE: Denies polyuria or polydipsia, heat or cold intolerance.  HEMATOLOGIC: Denies anemia, easy bruising, bleeding diathesis.  INTEGUMENT: Denies acne, rash.  MUSCULOSKELETAL: Denies any gout, cramps. Reports osteoarthritis.  NEUROLOGIC: Denies CVA, TIA, seizures, headache, tremors, vertigo. Denies any focal deficits, any tingling, any numbness. Reports generalized weakness.  PSYCHIATRIC: No history of anxiety, insomnia or depression.   PHYSICAL EXAMINATION: VITAL SIGNS:  Temperature 97.8, pulse 85, respiratory rate 18, blood pressure 138/81, saturating 91% on room air.  GENERAL: Morbidly obese female who looks comfortable  in bed, in no apparent distress.  HEENT: Head atraumatic, normocephalic. Pupils equal and reactive to light. Pink conjunctivae. Anicteric sclerae. Dry oral mucosa.  NECK: Supple. No thyromegaly. No JVD.  CHEST: Good air entry bilaterally. No wheezing, rales, rhonchi.  CARDIOVASCULAR: S1 and S2 heard. No rubs, murmurs, or gallops.  ABDOMEN: Obese, soft, nontender, nondistended. Bowel sounds present. No rebound. No guarding.  EXTREMITIES: No edema. No clubbing. No cyanosis. Pedal and radial pulses +2 bilaterally.  PSYCHIATRIC: Appropriate affect. Awake and alert x3. Intact judgment and insight.  NEUROLOGIC: Cranial nerves II through XII grossly intact. Strength 5/5. No focal deficits.  MUSCULOSKELETAL: No joint effusion or erythema.  LYMPHATIC: No cervical lymphadenopathy.  DIAGNOSTIC STUDIES: Pertinent labs: Glucose 157, BUN 16, creatinine 1.7, sodium 137, potassium 3.8, chloride 107, CO2 24. ALT 22, AST 21, alk phos 104. Troponin less than 0.02. White blood cells 9.4, hemoglobin 11.6, hematocrit 37.1, platelets 301,000. Urinalysis showing trace leukocyte esterase and 8 White blood cells.   Imaging studies: CT head without contrast showing negative exam.   ASSESSMENT AND PLAN: 1.  Weakness, nausea, vomiting, abdominal pain. This is most likely related to the patient's recent diagnosis of mild diverticulitis. She is afebrile, has no leukocytosis, but given her symptoms she will be admitted for hydration. We will start her on IV fluids. We will change her p.o. antibiotics, Cipro and Flagyl, to IV levofloxacin. Will have her on p.r.n. nausea and pain medicine. Will advance her diet as tolerated. We will consult physical therapy as well. As well, the patient is having urinary tract infection, most likely contributing to her weakness.  2.  Urinary tract infection. The patient is already on IV levofloxacin. 3.  Diabetes mellitus. We will hold glimepiride. We will continue on insulin sliding scale.  4.   Hypertension. Blood pressure acceptable. Continue with home medication. Will hold Micardis due to mild worsening of her renal failure.  5.  Hypothyroidism. Continue with Synthroid.  6.  Chronic kidney disease, mildly worsened, went from 1.5 baseline to 1.7. We will continue with fluids. Hold Micardis.  7.  History of proteinuria. Is being evaluated as an outpatient by [facility] nephrology.  8.  Deep vein thrombosis prophylaxis. Subcutaneous heparin drip. 9.  Gastrointestinal prophylaxis. On proton pump inhibitor.   CODE STATUS: The patient is FULL code.  TOTAL TIME SPENT ON ADMISSION AND PATIENT CARE: 55 minutes.  ____________________________ Dawood S. Elgergawy, MD dse:sb D: 05/04/2013 09:33:32 ET T: 05/04/2013 11:09:10 ET JOB#: [id number]  cc: Dawood S. Elgergawy, MD, <Dictator> DAWOOD S ELGERGAWY MD ELECTRONICALLY SIGNED 05/10/2013 1:47 

## 2014-06-11 NOTE — Op Note (Signed)
PATIENT NAME:  [name], [name] MR#:  [id number] DATE OF BIRTH:  17-Feb-1951  DATE OF PROCEDURE:  07/13/2013  PREOPERATIVE DIAGNOSIS: Gastrointestinal bleed.  POSTOPERATIVE DIAGNOSIS:   Gastrointestinal bleed.  PROCEDURES PERFORMED: 1.  Selective injection of middle colic artery, third order catheter placement.  2.  Bead embolization for termination of bleeding, middle colic artery.   SURGEON: Katha Cabal, M.D.   SEDATION:  Versed 3 mg plus fentanyl 100 mcg administered IV. Continuous ECG, pulse oximetry and cardiopulmonary monitoring is performed throughout the entire procedure by the interventional radiology nurse. Total sedation time was 1 hour.   ACCESS: A 6 French sheath, right common femoral artery.   FLUOROSCOPY TIME: 5.2 minutes.   CONTRAST USED: Isovue 70 mL.   INDICATIONS: [name] is a 64 year old woman who presents with persistent bleeding and a positive bleeding scan for the hepatic flexure. Risks and benefits for embolization with associated angiography were reviewed. All questions answered. The patient agrees to proceed.   DESCRIPTION OF PROCEDURE: The patient is taken to special procedures and placed in the supine position. After adequate sedation is achieved, the right groin is prepped and draped in a sterile fashion. Ultrasound is placed in a sterile sleeve. Common femoral artery is identified. It is echolucent and pulsatile indicating patency. Image is recorded for the permanent record. Under real-time visualization, Seldinger needle is inserted into the common femoral artery after 1% lidocaine has been infiltrated in the soft tissues. Microwire is advanced followed by micro sheath, J-wire followed by a 5 French sheath and 5 French pigtail catheter. AP projection of the aorta is then obtained with the pigtail catheter positioned at T12. This localizes the SMA and the pigtail catheter is adjusted slightly and a true lateral projection is obtained.   Using a combination  of a 6 [place] LIMA guiding catheter with a C2 catheter and floppy Glidewire, the SMA is engaged. The catheter is then advanced, followed by the guiding catheter. The catheter is then removed. Detector is positioned AP and hand injection of contrast is utilized to demonstrate the anatomy of the SMA. This represents first-order catheter placement.   Using a Progreat, the middle colic is then engaged and selected, representing second-order, and then the Progreat is negotiated out past the second division to the branch that appears to dominate the hepatic flexure and approximately  1 cubic centimeter of small 300 to 500 micron polyvinyl chloride beads are introduced. Followup imaging demonstrates there is a marked decrease in the flow as would be expected. It should be noted that on the pre-embolization image, there did appear to be a blush consistent with ongoing hemorrhage. The catheter is then repositioned more proximally in the middle colic and 0.5 cubic centimeters of beads is instilled. Followup imaging now demonstrates what appears to be an excellent result. The blush is no longer visualized. The guiding catheter is then removed. Oblique view of the right groin obtained. Attempts at a StarClose are made, but the device itself malfunctions, and therefore manual pressure is held, and the patient will stay flat for 4 hours.   INTERPRETATION: The SMA is opacified with a bolus injection of contrast demonstrating the middle colic. On selective imaging of the middle colic, it appears to divide into 3 dominant branches. The branch that extends to the hepatic flexure is then super selected, representing third order catheter placement and a small amount of beads is placed at this level. The catheter is then repositioned and 0.5 cubic centimeters of beads is placed.  Followup angiography now demonstrates there is persistent flow to the colon; however, the previously noted blush is no longer present.   SUMMARY:  Successful bead embolization of the middle colic artery.     ____________________________ Katha Cabal, MD ggs:dmm D: 07/13/2013 15:51:15 ET T: 07/13/2013 19:06:16 ET JOB#: [id number]  cc: Katha Cabal, MD, <Dictator> Richard E. [name], MD Adin Hector, MD Arther Dames, MD Katha Cabal MD ELECTRONICALLY SIGNED 07/20/2013 12:31

## 2014-06-11 NOTE — Consult Note (Signed)
Brief Consult Note: Diagnosis: acute diverticulitis.   Patient was seen by consultant.   Consult note dictated.   Recommend further assessment or treatment.   Comments: Suspect diverticulitis as opposed to malignancy (pt had colonoscopy 3 years ago) and she is improving with "good" BM this am. Will follow.  Electronic Signatures: Florene Glen (MD)  (Signed 18-Mar-15 17:24)  Authored: Brief Consult Note   Last Updated: 18-Mar-15 17:24 by Florene Glen (MD)

## 2014-06-11 NOTE — Discharge Summary (Signed)
PATIENT NAME:  [name], [name] MR#:  [id number] DATE OF BIRTH:  [birth year]  DATE OF ADMISSION:  07/09/2013 DATE OF DISCHARGE:  07/20/2013  ATTENDING PHYSICIAN: Dr. Chauncey Reading.   DISCHARGE DIAGNOSES: 1. Gastrointestinal bleed requiring transfusion, probable diverticular bleed.  2. History of diverticulitis, status post stricture status post exploratory laparotomy, right colon repair and Hartman procedure.  3. History of postoperative wound infection.  4. History of diabetes mellitus.  5. History of hypertension.  6. History of psoriasis.  7. History of back spur.  8. History of cholecystectomy.  9. History of hysterectomy.  10. History of left shoulder surgery with nerve release.   HOME MEDICATIONS: Are as follows: Micardis 80 mg p.o. daily. L-thyroxine 75 mcg p.o. daily, metoprolol 25 mg p.o. daily. Lipitor 40 mg p.o. at bedtime, glimepiride 4 mg p.o. b.i.d., Norco 2 tabs p.o. q.4 hours p.r.n. pain.   INDICATION FOR ADMISSION: Ms. Gainey is a pleasant 64 year old female with history of diverticulitis requiring Hartmann's procedure and colectomy. She was admitted with GI bleed which was pronounced through her ostomy.   HOSPITAL COURSE: Is as follows: Ms. Gibilisco was admitted. GI was consulted. She proceeded to bleed throughout hospital course and did have a tagged red blood cell scan which showed a hepatic flexure bleed. Also had a colonoscopy which showed large amounts of diverticula without obvious bleeding source. However, at the time of discharge, she has not bled for approximately 48 hours. She was tolerating good p.o. her wound was clean, dry, intact and undergoing dressing changes and her blood sugars were improved compared to time of admission.   DISCHARGE INSTRUCTIONS: Ms. Berth is to return in clinic in approximately one week. She is to call or return to the ED if has increased pain, nausea, vomiting, redness, drainage from incision. She is also to call if she has increased bleeding  from her ostomy.    ____________________________ Glena Norfolk Tyrelle Raczka, MD cal:sg D: 07/28/2013 12:37:52 ET T: 07/28/2013 13:04:49 ET JOB#: [id number]  cc: Harrell Gave A. Braniyah Besse, MD, <Dictator>  Floyde Parkins MD ELECTRONICALLY SIGNED 08/10/2013 10:46

## 2014-06-12 NOTE — Discharge Summary (Signed)
PATIENT NAME:  [name], [name] MR#:  [id number] DATE OF BIRTH:  January 20, 1951  DATE OF ADMISSION:  02/14/2011 DATE OF DISCHARGE:  02/17/2011  PRINCIPLE DIAGNOSIS: Partial small bowel obstruction - resolved, versus constipation.   OTHER DIAGNOSES:  1. Tachy arrhythmia.  2. Hypertension.  3. Type II diabetes mellitus. 4. Morbid obesity.   HOSPITAL COURSE: [name] was admitted to the hospital and given IV fluids and nasogastric suction and the following day had less than 300 mL of nasogastric output. It was initially bilious but it was clearing. She felt a lot better after having 2 to 3 bowel movements the day of admission and another 2 to 3 bowel movements the morning of hospital day one. She was passing flatus. Her white blood cell count went down from 15 to 7 and she had no bowel obstruction on the abdominal x-rays. Therefore, her nasogastric tube was removed. Her diet was advanced. She continued to have bowel movements and was tolerating a regular diet on the morning of discharge and had had essentially two bowel movements every day since she was admitted. She was, therefore, discharged home. She was asked to make an appointment to see Dr. Caryl Comes in two weeks and no surgical follow-up was necessary.   ____________________________ Consuela Mimes, MD wfm:drc D: 02/17/2011 10:30:40 ET T: 02/20/2011 12:25:36 ET JOB#: [id number]  cc: Consuela Mimes, MD, <Dictator> Adin Hector, MD Consuela Mimes MD ELECTRONICALLY SIGNED 02/20/2011 14:26

## 2014-06-12 NOTE — Consult Note (Signed)
PATIENT NAME:  [name], [name] MR#:  [id number] DATE OF BIRTH:  1951/02/18  DATE OF CONSULTATION:  02/14/2011  REFERRING PHYSICIAN:  Dia Crawford, MD CONSULTING PHYSICIAN:  Thelda Gagan H. Posey Pronto, MD  PRIMARY CARE PHYSICIAN: Ramonita Lab, MD  REASON FOR CONSULTATION: Opinion regarding the patient's hypertension, diabetes, psoriasis, and history of rapid heartbeat.   HISTORY OF PRESENT ILLNESS: The patient is a 64 year old female with history of hypertension, diabetes, psoriasis, and rapid heartbeat who presented to the ED with complaint of abdominal pain this morning. She had a CT scan which showed she had a small bowel obstruction. The patient was admitted by Surgery for treatment. The patient reports that since admission she is feeling better, her abdominal pain is mostly resolved, and she had two bowel movements. She otherwise denies any fevers or chills. No chest pain or shortness of breath. She reports that she feels cold chronically, otherwise, she has no other complaints.   PAST MEDICAL HISTORY:  1. Diabetes type 2.  2. Rapid heart rate, the patient cannot elaborate further on that whether this is atrial fibrillation or not as she is not sure. Her EKG here shows normal sinus rhythm.  3. Hypertension.  4. History of psoriasis.  5. Spur removal from her back.  6. Previous history of a pinched nerve in the left shoulder.  7. Status post hysterectomy.   ALLERGIES: No known drug allergies.   MEDICATIONS: (The patient does not have a full list of her medications, but she is on the following)  1. Proventil inhalation every four hours p.r.n.  2. Metformin one tab p.o. daily.  3. Amlodipine 5 mg daily.  4. Lisinopril 40 mg daily.  5. Atenolol 25 mg daily.  6. Aspirin 81 mg one tab p.o. daily.   SOCIAL HISTORY: She smokes only 1 to 2 cigarettes per day. No alcohol or drug use.   FAMILY HISTORY: Father with heart problems.  REVIEW OF SYSTEMS: CONSTITUTIONAL: Denies any fevers, fatigue, or  weakness. She presented with abdominal pain, which is improved. No weight loss or weight gain. EYES: No blurred or double vision. No pain. No redness. No inflammation. No glaucoma. No cataracts. ENT: No tinnitus. No ear pain or hearing loss. No allergies. No epistaxis. No nasal discharge. RESPIRATORY: No cough. No wheezing. No hemoptysis. No dyspnea. CARDIOVASCULAR: No chest pain. No orthopnea. No edema. No arrhythmia. Does report a history of rapid heart rate noted. No dyspnea on exertion. No palpitations. No syncope. GASTROINTESTINAL: Abdominal pain and emesis as above. No hematemesis. No melena. No irritable bowel syndrome. No jaundice. GU: Denies any dysuria, hematuria, renal calculus, or frequency. ENDOCRINE: Denies any polyuria, nocturia, or thyroid problems. HEME/LYMPH: Denies any anemia, easy bruisability, or swollen glands. SKIN: No acne. Does have chronic rash due to psoriasis. MUSCULOSKELETAL: Denies any pain in the neck, back, or shoulder. Does have some osteoarthritis pain in different parts of the body. NEURO: No numbness. No weakness. No cerebrovascular accident. No transient ischemic attack. No seizures. PSYCHIATRIC: Not anxious. No insomnia. No ADD.   PHYSICAL EXAMINATION:   VITAL SIGNS: Temperature 97.2, pulse 85, respirations 18, blood pressure 141/81, and O2 91% on room air.   GENERAL: The patient is an obese female, currently not in any acute distress.   HEENT: Head atraumatic, normocephalic. Pupils are equally round and reactive to light and accommodation. Extraocular movements are intact. Oropharynx is clear.   NECK: There is no thyromegaly. No carotid bruits.   HEART: Regular rate and rhythm. No murmurs, rubs, clicks, or  gallops. PMI is not displaced.   LUNGS: Clear to auscultation bilaterally without any rales, rhonchi, or wheezing.   ABDOMEN: Currently soft and currently no bowel sounds present. There is no tenderness. No guarding or organomegaly.   EXTREMITIES: No  clubbing, cyanosis, or edema.   NEUROLOGIC: Awake, alert, and oriented x3. No focal deficits.   VASCULAR: Good DP and PT pulses.   SKIN: Rash related to her psoriasis, on her elbows.   LYMPHATICS: No lymph nodes palpable.   MUSCULOSKELETAL: No erythema or swelling.   LABS/STUDIES: CT scan of the abdomen and pelvis showed findings consistent with small bowel obstruction, partial or early.  Urinalysis shows blood 2+, leukocytes negative, and nitrates negative.   EKG showed sinus rhythm with occasional PVCs.   BMP: Glucose was 233, BUN 22, creatinine 1.49, sodium 143, potassium 4.3, chloride 106, and CO2 28. WBC was 15.2, hemoglobin 12.5, and platelet count 320. Lipase 137.  ASSESSMENT AND PLAN: The patient is a 64 year old female who presents with abdominal pain and noted to have small bowel obstruction.  1. Hypertension: At this time, we will hold all p.o. medications in light of her small bowel obstruction. We will add hydralazine p.r.n. Her blood pressure is currently stable. Once she is taking p.o. can resume her amlodipine, lisinopril, and atenolol.  2. Diabetes type 2: Blood glucose was elevated on presentation, likely due to her small bowel obstruction. At this time, we will hold p.o. metformin, continue higher dose sliding scale as written, and we will monitor her blood sugars and adjust her regimen as needed.  3. History of rapid heart rate: Monitor vitals. If heart rate becomes elevated, then telemetry. Her EKG on presentation shows normal sinus rhythm without any arrhythmias.  4. Miscellaneous: Recommend deep vein thrombosis prophylaxis. 5. Leukocytosis: Likely due to her small bowel obstruction. There is no evidence of infection. Monitor this in the a.m.  6. Elevated creatinine: [name] creatinine was 1.06 in 2010 so this is likely prerenal. Continue IV fluids as currently being given and follow BMP in the a.m.    TIME SPENT: 35  minutes. ____________________________ Lafonda Mosses Posey Pronto, MD shp:slb D: 02/14/2011 17:13:43 ET T: 02/14/2011 17:45:10 ET JOB#: [id number]  cc: Vickey Boak H. Posey Pronto, MD, <Dictator> Adin Hector, MD Alric Seton MD ELECTRONICALLY SIGNED 02/23/2011 15:19

## 2014-08-12 ENCOUNTER — Telehealth: Payer: Self-pay | Admitting: Surgery

## 2014-08-12 NOTE — Telephone Encounter (Signed)
Patient would like to speak with a nurse to know when she can have her colostomy bag reversed. Please call and advise. Thank you.

## 2014-08-12 NOTE — Telephone Encounter (Signed)
Returned patient call. Patient has been scheduled for a office visit on 08/30/14 to see Dr Rexene Edison to discuss colostomy reversal. Patient confirmed appointment date and time.

## 2014-08-30 ENCOUNTER — Ambulatory Visit: Payer: Self-pay | Admitting: Surgery

## 2014-08-31 ENCOUNTER — Ambulatory Visit: Payer: Self-pay | Admitting: Surgery

## 2014-09-05 ENCOUNTER — Ambulatory Visit (INDEPENDENT_AMBULATORY_CARE_PROVIDER_SITE_OTHER): Payer: PPO | Admitting: Surgery

## 2014-09-05 ENCOUNTER — Encounter: Payer: Self-pay | Admitting: Surgery

## 2014-09-05 VITALS — BP 153/82 | HR 32 | Temp 98.4°F | Ht 63.0 in | Wt 220.0 lb

## 2014-09-05 DIAGNOSIS — K5732 Diverticulitis of large intestine without perforation or abscess without bleeding: Secondary | ICD-10-CM | POA: Insufficient documentation

## 2014-09-05 DIAGNOSIS — E669 Obesity, unspecified: Secondary | ICD-10-CM | POA: Insufficient documentation

## 2014-09-05 NOTE — Progress Notes (Signed)
Patient ID: [name], female   DOB: 07-19-1950, 64 y.o.   MRN: [id number]  Chief Complaint  Patient presents with  . Follow-up    HPI   [name] is a 64 y.o. female. Who underwent an emergent sigmoid colectomy and colostomy and repair of right colonic perforation in March 2015 for diverticular stricture with obstruction and perforation. Postoperatively she had significant wound problems with long-standing wound VAC. Furthermore in summer of 2015 the patient had a diverticular bleed from the hepatic flexure which was successfully embolized. A colonoscopy was performed at that time as well due to poor prep the right colon could not be fully visualized. She has been seen in the office on multiple occasions regarding consultation on colostomy reversal. Each time she has decided against it. Today she returns again for further discussion. She is seen in the office today by herself.  There has been no further colonic bleeding since summer of 2015. A follow-up colonoscopy has not been performed.   HPI  Past Medical History  Diagnosis Date  . Diabetes mellitus without complication   . GI bleed   . Aortic valve stenosis   . Hyperlipidemia   . Hypertension   . Diverticulitis large intestine   . Obesity     Past Surgical History  Procedure Laterality Date  . Colectomy    . Laparotomy closure of cecal perforation  05/09/2013    Dr. Marina Gravel    Family History  Problem Relation Age of Onset  . Diabetes Mother   . Hypertension Father     Social History History  Substance Use Topics  . Smoking status: Former Smoker -- 1 years    Types: Cigarettes  . Smokeless tobacco: Not on file  . Alcohol Use: No    No Known Allergies  Current Outpatient Prescriptions  Medication Sig Dispense Refill  . atorvastatin (LIPITOR) 40 MG tablet Take 40 mg by mouth at bedtime.  2  . clobetasol cream (TEMOVATE) [id number] % 1 application.  2  . DULoxetine (CYMBALTA) 30 MG capsule Take 1  capsule by mouth.  11  . glimepiride (AMARYL) 4 MG tablet Take 1 tablet by mouth daily.  3  . levothyroxine (SYNTHROID, LEVOTHROID) 88 MCG tablet Take 1 tablet by mouth daily.  2  . metoprolol succinate (TOPROL-XL) 25 MG 24 hr tablet Take 25 mg by mouth daily.  2  . nystatin (MYCOSTATIN/NYSTOP) 100000 UNIT/GM POWD Apply 1 Tube topically daily.  3   No current facility-administered medications for this visit.      Review of Systems A 10 point review of systems was asked and was negative except for the following positive findings  Blood pressure 153/82, pulse 32, temperature 98.4 F (36.9 C), temperature source Oral, height 5\' 3"  (1.6 m), weight 220 lb (99.791 kg).  No results found for this or any previous visit (from the past 48 hour(s)). No results found.  Physical Exam CONSTITUTIONAL:  Pleasant, well-developed, well-nourished, and in no acute distress. EYES: Pupils equal and reactive to light, Sclera non-icteric EARS, NOSE, MOUTH AND THROAT:  The oropharynx was clear.  Dentition is good repair.  Oral mucosa pink and moist. LYMPH NODES:  Lymph nodes in the neck and axillae were normal RESPIRATORY:  Lungs were clear.  Normal respiratory effort without pathologic use of accessory muscles of respiration CARDIOVASCULAR: Heart was regular without murmurs.  There were no carotid bruits. GI: There is significant central truncal obesity. Left lower quadrant colostomy is pink healthy with an ostomy  bag in place. The left side of her abdomen is substantially larger than her right suspicious for a large parastomal hernia however due to her significant truncal obesity I could not appreciate a fascial defect today in the office. She has a generous midline scar which is completely healed at this time. GU:  Rectal deferred.   MUSCULOSKELETAL:  Normal muscle strength and tone.  No clubbing or cyanosis.   SKIN:  There were no pathologic skin lesions.  There were no nodules on palpation. NEUROLOGIC:   Sensation is normal.  Cranial nerves are grossly intact. PSYCH:  Oriented to person, place and time.  Mood and affect are normal.  Data Reviewed   I have personally reviewed the patient's imaging, laboratory findings and medical records.    Assessment    Complex obese 64 year old female with history of diverticular stricture obstruction right colonic perforation requiring emergent Hartman's procedure repair of cecal perforation followed by diverticular bleeding from the right side status post successful embolization.  She has been nominally interested in colostomy reversal. Today in the office I feel the same feeling from her.    Plan    An initial becomes truly interested in colostomy reversal she will require a colonoscopy both through the ostomy and the rectum. I told her to contact our office when she makes her decision.       Sherri Rad 09/05/2014, 1:57 PM

## 2014-09-05 NOTE — Patient Instructions (Addendum)
Please give [place] a call if you have any questions.  All the office when ready to schedule an operation.

## 2014-09-12 ENCOUNTER — Telehealth: Payer: Self-pay | Admitting: Surgery

## 2014-09-12 NOTE — Telephone Encounter (Signed)
Patient said she wants to set up a surgery date for her reversal.

## 2014-09-14 NOTE — Telephone Encounter (Signed)
I have called patient back to discuss a surgery date for Colostomy takedown. No answer at [phone] (H) and not able to leave a voicemail. I then called [phone]--patient not available. I left a message with the female that answered phone to have patient call the office when available.

## 2014-09-15 NOTE — Telephone Encounter (Signed)
Pt advised of pre op date/time and sx date. Sx: 09/23/14 with Dr Marina Gravel with Dr Genevive Bi assisting--colostomy reversal Pre op: 09/19/14 @ 11:45am.

## 2014-09-19 ENCOUNTER — Other Ambulatory Visit: Payer: Self-pay

## 2014-09-19 ENCOUNTER — Encounter
Admission: RE | Admit: 2014-09-19 | Discharge: 2014-09-19 | Disposition: A | Discharge status: Home / Self Care | Payer: PPO | Source: Ambulatory Visit | Attending: Surgery | Admitting: Surgery

## 2014-09-19 ENCOUNTER — Telehealth: Payer: Self-pay

## 2014-09-19 DIAGNOSIS — Z01812 Encounter for preprocedural laboratory examination: Secondary | ICD-10-CM | POA: Insufficient documentation

## 2014-09-19 DIAGNOSIS — K5732 Diverticulitis of large intestine without perforation or abscess without bleeding: Secondary | ICD-10-CM

## 2014-09-19 DIAGNOSIS — Z01810 Encounter for preprocedural cardiovascular examination: Secondary | ICD-10-CM | POA: Diagnosis not present

## 2014-09-19 HISTORY — DX: Reserved for inherently not codable concepts without codable children: IMO0001

## 2014-09-19 HISTORY — DX: Dizziness and giddiness: R42

## 2014-09-19 HISTORY — DX: Unspecified osteoarthritis, unspecified site: M19.90

## 2014-09-19 HISTORY — DX: Anemia, unspecified: D64.9

## 2014-09-19 HISTORY — DX: Type 2 diabetes mellitus with diabetic neuropathy, unspecified: E11.40

## 2014-09-19 HISTORY — DX: Anxiety disorder, unspecified: F41.9

## 2014-09-19 LAB — TYPE AND SCREEN
ABO/RH(D): A POS
Antibody Screen: NEGATIVE

## 2014-09-19 LAB — BASIC METABOLIC PANEL
Anion gap: 8 (ref 5–15)
BUN: 32 mg/dL — ABNORMAL HIGH (ref 6–20)
CHLORIDE: 106 mmol/L (ref 101–111)
CO2: 23 mmol/L (ref 22–32)
Calcium: 8.4 mg/dL — ABNORMAL LOW (ref 8.9–10.3)
Creatinine, Ser: 1.93 mg/dL — ABNORMAL HIGH (ref 0.44–1.00)
GFR, EST AFRICAN AMERICAN: 30 mL/min — AB (ref >60)
GFR, EST NON AFRICAN AMERICAN: 26 mL/min — AB (ref >60)
Glucose, Bld: 191 mg/dL — ABNORMAL HIGH (ref 65–99)
Potassium: 4.5 mmol/L (ref 3.5–5.1)
SODIUM: 137 mmol/L (ref 135–145)

## 2014-09-19 LAB — CBC WITH DIFFERENTIAL/PLATELET
Basophils Absolute: 0 10*3/uL (ref 0–0.1)
Basophils Relative: 1 %
Eosinophils Absolute: 0.1 10*3/uL (ref 0–0.7)
Eosinophils Relative: 2 %
HCT: 39.5 % (ref 35.0–47.0)
Hemoglobin: 12.5 g/dL (ref 12.0–16.0)
LYMPHS ABS: 1.3 10*3/uL (ref 1.0–3.6)
Lymphocytes Relative: 16 %
MCH: 26.4 pg (ref 26.0–34.0)
MCHC: 31.7 g/dL — ABNORMAL LOW (ref 32.0–36.0)
MCV: 83.2 fL (ref 80.0–100.0)
MONO ABS: 0.6 10*3/uL (ref 0.2–0.9)
Monocytes Relative: 7 %
NEUTROS ABS: 5.9 10*3/uL (ref 1.4–6.5)
Neutrophils Relative %: 74 %
PLATELETS: 223 10*3/uL (ref 150–440)
RBC: 4.75 MIL/uL (ref 3.80–5.20)
RDW: 14.9 % — ABNORMAL HIGH (ref 11.5–14.5)
WBC: 7.9 10*3/uL (ref 3.6–11.0)

## 2014-09-19 LAB — ABO/RH: ABO/RH(D): A POS

## 2014-09-19 MED ORDER — BISACODYL 5 MG PO TBEC: 20.0000 mg | DELAYED_RELEASE_TABLET | Freq: Once | ORAL | Status: DC

## 2014-09-19 MED ORDER — FLEET ENEMA 7-19 GM/118ML RE ENEM: 1.0000 | ENEMA | Freq: Once | RECTAL | Status: DC

## 2014-09-19 MED ORDER — POLYETHYLENE GLYCOL 3350 17 GM/SCOOP PO POWD: 1.0000 | Freq: Once | ORAL | Status: DC

## 2014-09-19 NOTE — Telephone Encounter (Signed)
Patient came in to review Colon Prep with myself. All questions answered to patient's satisfaction.  Medications sent to [facility] [place] per her preference.  Colostomy reversal scheduled for 09/23/14 with Dr. Marina Gravel.

## 2014-09-19 NOTE — Patient Instructions (Signed)
  Your procedure is scheduled on: September 23, 2014 (Friday) Report to Day Surgery. To find out your arrival time please call [phone] between 1PM - 3PM on September 22, 2014 (Thursday).  Remember: Instructions that are not followed completely may result in serious medical risk, up to and including death, or upon the discretion of your surgeon and anesthesiologist your surgery may need to be rescheduled.    __x__ 1. Do not eat food or drink liquids after midnight. No gum chewing or hard candies.     ____ 2. No Alcohol for 24 hours before or after surgery.   ____ 3. Bring all medications with you on the day of surgery if instructed.    __x__ 4. Notify your doctor if there is any change in your medical condition     (cold, fever, infections).     Do not wear jewelry, make-up, hairpins, clips or nail polish.  Do not wear lotions, powders, or perfumes. You may wear deodorant.  Do not shave 48 hours prior to surgery. Men may shave face and neck.  Do not bring valuables to the hospital.    [facility] is not responsible for any belongings or valuables.               Contacts, dentures or bridgework may not be worn into surgery.  Leave your suitcase in the car. After surgery it may be brought to your room.  For patients admitted to the hospital, discharge time is determined by your                treatment team.   Patients discharged the day of surgery will not be allowed to drive home.   Please read over the following fact sheets that you were given:   Surgical Site Infection Prevention   ____ Take these medicines the morning of surgery with A SIP OF WATER:    1. Metoprolol  2. Micardis  3.   4.  5.  6.  ____ Fleet Enema (as directed)   __x__ Use CHG Soap as directed  ____ Use inhalers on the day of surgery  ____ Stop metformin 2 days prior to surgery    ____ Take 1/2 of usual insulin dose the night before surgery and none on the morning of surgery.   ____ Stop  Coumadin/Plavix/aspirin on   ____ Stop Anti-inflammatories on    ____ Stop supplements until after surgery.    ____ Bring C-Pap to the hospital.

## 2014-09-19 NOTE — Progress Notes (Signed)
[name] from Pre-admit called and states that she needs orders as patient is currently in office for pre-admission testing.   Spoke with Dr. Marina Gravel at this time.   Orders placed for CBC, BMP, EKG, and T&S. He states that he will finish orders when able to do so.

## 2014-09-19 NOTE — Addendum Note (Signed)
Addended by: Sherri Rad on: 09/19/2014 03:40 PM   Modules accepted: Orders

## 2014-09-20 NOTE — OR Nursing (Signed)
Medical clearance request faxed to Dr Caryn Section and called to Dr Marina Gravel office

## 2014-09-22 ENCOUNTER — Telehealth: Payer: Self-pay | Admitting: Surgery

## 2014-09-22 NOTE — Telephone Encounter (Signed)
Surgery has been canceled on 09/23/14. Medical Clearance was not granted by Dr. Caryl Comes. He has ordered an EKG due to his concern of her valves. I will call the patient to discuss another surgery date. Dr Marina Gravel, OR and Pre admit has been advised of the cancellation.

## 2014-09-22 NOTE — Telephone Encounter (Signed)
Noted. Please let me know with any changes in the clearance process or anything further that we need to do.

## 2014-09-23 ENCOUNTER — Encounter: Admission: RE | Payer: Self-pay | Source: Ambulatory Visit

## 2014-09-23 ENCOUNTER — Inpatient Hospital Stay: Admission: RE | Admit: 2014-09-23 | Payer: PPO | Source: Ambulatory Visit | Admitting: Surgery

## 2014-09-23 SURGERY — CLOSURE, COLOSTOMY
Anesthesia: Choice

## 2014-10-04 DIAGNOSIS — I10 Essential (primary) hypertension: Secondary | ICD-10-CM | POA: Insufficient documentation

## 2014-10-11 DIAGNOSIS — I471 Supraventricular tachycardia, unspecified: Secondary | ICD-10-CM | POA: Insufficient documentation

## 2014-10-11 DIAGNOSIS — I350 Nonrheumatic aortic (valve) stenosis: Secondary | ICD-10-CM | POA: Insufficient documentation

## 2014-10-11 DIAGNOSIS — R0602 Shortness of breath: Secondary | ICD-10-CM | POA: Insufficient documentation

## 2015-02-04 ENCOUNTER — Inpatient Hospital Stay
Admission: EM | Admit: 2015-02-04 | Discharge: 2015-02-15 | DRG: 353 | Disposition: A | Discharge status: Skilled Nursing Facility | Payer: PPO | Attending: Surgery | Admitting: Surgery

## 2015-02-04 ENCOUNTER — Encounter: Payer: Self-pay | Admitting: *Deleted

## 2015-02-04 ENCOUNTER — Emergency Department: Payer: PPO

## 2015-02-04 DIAGNOSIS — Z87891 Personal history of nicotine dependence: Secondary | ICD-10-CM

## 2015-02-04 DIAGNOSIS — Z794 Long term (current) use of insulin: Secondary | ICD-10-CM

## 2015-02-04 DIAGNOSIS — E785 Hyperlipidemia, unspecified: Secondary | ICD-10-CM | POA: Diagnosis present

## 2015-02-04 DIAGNOSIS — I350 Nonrheumatic aortic (valve) stenosis: Secondary | ICD-10-CM | POA: Diagnosis present

## 2015-02-04 DIAGNOSIS — K5669 Other intestinal obstruction: Secondary | ICD-10-CM

## 2015-02-04 DIAGNOSIS — Z452 Encounter for adjustment and management of vascular access device: Secondary | ICD-10-CM

## 2015-02-04 DIAGNOSIS — Z933 Colostomy status: Secondary | ICD-10-CM | POA: Diagnosis not present

## 2015-02-04 DIAGNOSIS — E782 Mixed hyperlipidemia: Secondary | ICD-10-CM | POA: Diagnosis present

## 2015-02-04 DIAGNOSIS — N179 Acute kidney failure, unspecified: Secondary | ICD-10-CM

## 2015-02-04 DIAGNOSIS — K56609 Unspecified intestinal obstruction, unspecified as to partial versus complete obstruction: Secondary | ICD-10-CM | POA: Diagnosis present

## 2015-02-04 DIAGNOSIS — K433 Parastomal hernia with obstruction, without gangrene: Principal | ICD-10-CM | POA: Diagnosis present

## 2015-02-04 DIAGNOSIS — F419 Anxiety disorder, unspecified: Secondary | ICD-10-CM | POA: Diagnosis present

## 2015-02-04 DIAGNOSIS — I959 Hypotension, unspecified: Secondary | ICD-10-CM | POA: Diagnosis not present

## 2015-02-04 DIAGNOSIS — I129 Hypertensive chronic kidney disease with stage 1 through stage 4 chronic kidney disease, or unspecified chronic kidney disease: Secondary | ICD-10-CM | POA: Diagnosis present

## 2015-02-04 DIAGNOSIS — I4891 Unspecified atrial fibrillation: Secondary | ICD-10-CM | POA: Diagnosis present

## 2015-02-04 DIAGNOSIS — E872 Acidosis, unspecified: Secondary | ICD-10-CM

## 2015-02-04 DIAGNOSIS — R000 Tachycardia, unspecified: Secondary | ICD-10-CM

## 2015-02-04 DIAGNOSIS — E876 Hypokalemia: Secondary | ICD-10-CM | POA: Diagnosis not present

## 2015-02-04 DIAGNOSIS — E1140 Type 2 diabetes mellitus with diabetic neuropathy, unspecified: Secondary | ICD-10-CM | POA: Diagnosis present

## 2015-02-04 DIAGNOSIS — R0600 Dyspnea, unspecified: Secondary | ICD-10-CM | POA: Diagnosis not present

## 2015-02-04 DIAGNOSIS — K460 Unspecified abdominal hernia with obstruction, without gangrene: Secondary | ICD-10-CM | POA: Diagnosis present

## 2015-02-04 DIAGNOSIS — Z79899 Other long term (current) drug therapy: Secondary | ICD-10-CM | POA: Diagnosis not present

## 2015-02-04 DIAGNOSIS — R109 Unspecified abdominal pain: Secondary | ICD-10-CM

## 2015-02-04 DIAGNOSIS — E039 Hypothyroidism, unspecified: Secondary | ICD-10-CM | POA: Diagnosis present

## 2015-02-04 DIAGNOSIS — E875 Hyperkalemia: Secondary | ICD-10-CM | POA: Diagnosis present

## 2015-02-04 DIAGNOSIS — N183 Chronic kidney disease, stage 3 (moderate): Secondary | ICD-10-CM | POA: Diagnosis present

## 2015-02-04 DIAGNOSIS — T814XXD Infection following a procedure, subsequent encounter: Secondary | ICD-10-CM | POA: Diagnosis not present

## 2015-02-04 DIAGNOSIS — L03311 Cellulitis of abdominal wall: Secondary | ICD-10-CM | POA: Diagnosis not present

## 2015-02-04 DIAGNOSIS — N170 Acute kidney failure with tubular necrosis: Secondary | ICD-10-CM | POA: Diagnosis not present

## 2015-02-04 HISTORY — DX: Unspecified intestinal obstruction, unspecified as to partial versus complete obstruction: K56.609

## 2015-02-04 LAB — URINALYSIS COMPLETE WITH MICROSCOPIC (ARMC ONLY)
BILIRUBIN URINE: NEGATIVE
GLUCOSE, UA: 150 mg/dL — AB
Ketones, ur: NEGATIVE mg/dL
LEUKOCYTES UA: NEGATIVE
Nitrite: NEGATIVE
Protein, ur: 100 mg/dL — AB
Specific Gravity, Urine: 1.019 (ref 1.005–1.030)
WBC, UA: NONE SEEN WBC/hpf (ref 0–5)
pH: 6 (ref 5.0–8.0)

## 2015-02-04 LAB — LACTIC ACID, PLASMA
LACTIC ACID, VENOUS: 1.6 mmol/L (ref 0.5–2.0)
LACTIC ACID, VENOUS: 1.4 mmol/L (ref 0.5–2.0)

## 2015-02-04 LAB — TSH: TSH: 4.552 u[IU]/mL — AB (ref 0.350–4.500)

## 2015-02-04 LAB — COMPREHENSIVE METABOLIC PANEL
ALBUMIN: 3.7 g/dL (ref 3.5–5.0)
ALT: 13 U/L — ABNORMAL LOW (ref 14–54)
AST: 20 U/L (ref 15–41)
Alkaline Phosphatase: 97 U/L (ref 38–126)
Anion gap: 9 (ref 5–15)
BUN: 29 mg/dL — ABNORMAL HIGH (ref 6–20)
CHLORIDE: 107 mmol/L (ref 101–111)
CO2: 22 mmol/L (ref 22–32)
Calcium: 8.3 mg/dL — ABNORMAL LOW (ref 8.9–10.3)
Creatinine, Ser: 1.9 mg/dL — ABNORMAL HIGH (ref 0.44–1.00)
GFR calc Af Amer: 31 mL/min — ABNORMAL LOW (ref >60)
GFR, EST NON AFRICAN AMERICAN: 27 mL/min — AB (ref >60)
Glucose, Bld: 250 mg/dL — ABNORMAL HIGH (ref 65–99)
POTASSIUM: 5.1 mmol/L (ref 3.5–5.1)
SODIUM: 138 mmol/L (ref 135–145)
Total Bilirubin: 0.9 mg/dL (ref 0.3–1.2)
Total Protein: 7.4 g/dL (ref 6.5–8.1)

## 2015-02-04 LAB — GLUCOSE, CAPILLARY
GLUCOSE-CAPILLARY: 285 mg/dL — AB (ref 65–99)
GLUCOSE-CAPILLARY: 111 mg/dL — AB (ref 65–99)
Glucose-Capillary: 144 mg/dL — ABNORMAL HIGH (ref 65–99)

## 2015-02-04 LAB — CBC
HEMATOCRIT: 41.8 % (ref 35.0–47.0)
Hemoglobin: 13 g/dL (ref 12.0–16.0)
MCH: 25.9 pg — AB (ref 26.0–34.0)
MCHC: 31 g/dL — ABNORMAL LOW (ref 32.0–36.0)
MCV: 83.6 fL (ref 80.0–100.0)
PLATELETS: 238 10*3/uL (ref 150–440)
RBC: 5 MIL/uL (ref 3.80–5.20)
RDW: 14.8 % — AB (ref 11.5–14.5)
WBC: 12 10*3/uL — AB (ref 3.6–11.0)

## 2015-02-04 LAB — LIPASE, BLOOD: LIPASE: 21 U/L (ref 11–51)

## 2015-02-04 LAB — TYPE AND SCREEN
ABO/RH(D): A POS
ANTIBODY SCREEN: NEGATIVE

## 2015-02-04 LAB — HEMOGLOBIN A1C: HEMOGLOBIN A1C: 9.2 % — AB (ref 4.0–6.0)

## 2015-02-04 MED ORDER — PIPERACILLIN-TAZOBACTAM 3.375 G IVPB
3.3750 g | Freq: Once | INTRAVENOUS | Status: AC
  Administered 2015-02-04: 3.375 g via INTRAVENOUS
  Filled 2015-02-04: qty 50

## 2015-02-04 MED ORDER — PANTOPRAZOLE SODIUM 40 MG IV SOLR
40.0000 mg | INTRAVENOUS | Status: DC
  Administered 2015-02-04 – 2015-02-09 (×6): 40 mg via INTRAVENOUS
  Filled 2015-02-04 (×6): qty 40

## 2015-02-04 MED ORDER — ENOXAPARIN SODIUM 40 MG/0.4ML ~~LOC~~ SOLN
40.0000 mg | SUBCUTANEOUS | Status: DC
  Administered 2015-02-04: 40 mg via SUBCUTANEOUS
  Filled 2015-02-04: qty 0.4

## 2015-02-04 MED ORDER — ONDANSETRON 4 MG PO TBDP
4.0000 mg | ORAL_TABLET | Freq: Once | ORAL | Status: AC | PRN
  Administered 2015-02-04: 4 mg via ORAL
  Filled 2015-02-04: qty 1

## 2015-02-04 MED ORDER — FENTANYL CITRATE (PF) 100 MCG/2ML IJ SOLN
50.0000 ug | Freq: Once | INTRAMUSCULAR | Status: AC
  Administered 2015-02-04: 50 ug via INTRAVENOUS
  Filled 2015-02-04: qty 2

## 2015-02-04 MED ORDER — MORPHINE SULFATE (PF) 4 MG/ML IV SOLN: 4.0000 mg | Freq: Once | INTRAVENOUS | Status: DC

## 2015-02-04 MED ORDER — HYDROMORPHONE HCL 1 MG/ML IJ SOLN
1.0000 mg | INTRAMUSCULAR | Status: DC | PRN
  Administered 2015-02-04 – 2015-02-06 (×19): 1 mg via INTRAVENOUS
  Filled 2015-02-04 (×19): qty 1

## 2015-02-04 MED ORDER — METOPROLOL SUCCINATE ER 25 MG PO TB24
25.0000 mg | ORAL_TABLET | Freq: Every day | ORAL | Status: DC
Start: 2015-02-04 — End: 2015-02-08
  Administered 2015-02-04 – 2015-02-08 (×4): 25 mg via ORAL
  Filled 2015-02-04 (×4): qty 1

## 2015-02-04 MED ORDER — IRBESARTAN 75 MG PO TABS
37.5000 mg | ORAL_TABLET | Freq: Every day | ORAL | Status: DC
  Administered 2015-02-04 – 2015-02-05 (×2): 37.5 mg via ORAL
  Filled 2015-02-04 (×2): qty 1

## 2015-02-04 MED ORDER — ACETAMINOPHEN 650 MG RE SUPP: 650.0000 mg | Freq: Four times a day (QID) | RECTAL | Status: DC | PRN

## 2015-02-04 MED ORDER — INSULIN ASPART 100 UNIT/ML ~~LOC~~ SOLN
0.0000 [IU] | Freq: Every day | SUBCUTANEOUS | Status: DC
  Filled 2015-02-04: qty 9

## 2015-02-04 MED ORDER — KCL IN DEXTROSE-NACL 20-5-0.45 MEQ/L-%-% IV SOLN
INTRAVENOUS | Status: DC
  Administered 2015-02-04: 09:00:00 via INTRAVENOUS
  Filled 2015-02-04 (×3): qty 1000

## 2015-02-04 MED ORDER — ONDANSETRON HCL 4 MG PO TABS: 4.0000 mg | ORAL_TABLET | Freq: Four times a day (QID) | ORAL | Status: DC | PRN

## 2015-02-04 MED ORDER — ACETAMINOPHEN 325 MG PO TABS
650.0000 mg | ORAL_TABLET | Freq: Four times a day (QID) | ORAL | Status: DC | PRN
  Administered 2015-02-09: 650 mg via ORAL
  Filled 2015-02-04: qty 2

## 2015-02-04 MED ORDER — ONDANSETRON HCL 4 MG/2ML IJ SOLN: 4.0000 mg | Freq: Four times a day (QID) | INTRAMUSCULAR | Status: DC | PRN

## 2015-02-04 MED ORDER — LORAZEPAM 2 MG/ML IJ SOLN: 2.0000 mg | INTRAMUSCULAR | Status: DC | PRN

## 2015-02-04 MED ORDER — ONDANSETRON HCL 4 MG/2ML IJ SOLN
4.0000 mg | Freq: Once | INTRAMUSCULAR | Status: AC
  Administered 2015-02-04: 4 mg via INTRAVENOUS

## 2015-02-04 MED ORDER — HYDRALAZINE HCL 20 MG/ML IJ SOLN: 10.0000 mg | INTRAMUSCULAR | Status: DC | PRN

## 2015-02-04 MED ORDER — DEXTROSE-NACL 5-0.45 % IV SOLN
INTRAVENOUS | Status: AC
  Administered 2015-02-04: 12:00:00 via INTRAVENOUS

## 2015-02-04 MED ORDER — ONDANSETRON HCL 4 MG/2ML IJ SOLN
INTRAMUSCULAR | Status: AC
  Administered 2015-02-04: 4 mg via INTRAVENOUS
  Filled 2015-02-04: qty 2

## 2015-02-04 MED ORDER — MORPHINE SULFATE (PF) 4 MG/ML IV SOLN
INTRAVENOUS | Status: AC
  Administered 2015-02-04: 4 mg via INTRAVENOUS
  Filled 2015-02-04: qty 1

## 2015-02-04 MED ORDER — BENZOCAINE 20 % MT SOLN
Freq: Four times a day (QID) | OROMUCOSAL | Status: DC | PRN
  Administered 2015-02-04: 1 via OROMUCOSAL
  Filled 2015-02-04: qty 57

## 2015-02-04 MED ORDER — IOHEXOL 240 MG/ML SOLN
25.0000 mL | Freq: Once | INTRAMUSCULAR | Status: AC | PRN
  Administered 2015-02-04: 25 mL via ORAL

## 2015-02-04 MED ORDER — MORPHINE SULFATE (PF) 2 MG/ML IV SOLN
INTRAVENOUS | Status: AC
  Administered 2015-02-04: 2 mg via INTRAVENOUS
  Filled 2015-02-04: qty 1

## 2015-02-04 MED ORDER — IOHEXOL 300 MG/ML  SOLN
80.0000 mL | Freq: Once | INTRAMUSCULAR | Status: AC | PRN
  Administered 2015-02-04: 80 mL via INTRAVENOUS

## 2015-02-04 MED ORDER — INSULIN ASPART 100 UNIT/ML ~~LOC~~ SOLN
0.0000 [IU] | Freq: Three times a day (TID) | SUBCUTANEOUS | Status: DC
  Administered 2015-02-04: 1 [IU] via SUBCUTANEOUS
  Administered 2015-02-04: 5 [IU] via SUBCUTANEOUS
  Administered 2015-02-05 (×2): 2 [IU] via SUBCUTANEOUS
  Administered 2015-02-06: 1 [IU] via SUBCUTANEOUS
  Administered 2015-02-07 – 2015-02-08 (×6): 2 [IU] via SUBCUTANEOUS
  Administered 2015-02-09 – 2015-02-10 (×4): 1 [IU] via SUBCUTANEOUS
  Administered 2015-02-10 – 2015-02-15 (×9): 2 [IU] via SUBCUTANEOUS
  Filled 2015-02-04 (×2): qty 2
  Filled 2015-02-04: qty 1
  Filled 2015-02-04 (×7): qty 2
  Filled 2015-02-04: qty 1
  Filled 2015-02-04 (×4): qty 2
  Filled 2015-02-04: qty 1
  Filled 2015-02-04 (×2): qty 2
  Filled 2015-02-04: qty 1
  Filled 2015-02-04: qty 5
  Filled 2015-02-04 (×2): qty 2
  Filled 2015-02-04 (×2): qty 1

## 2015-02-04 MED ORDER — ONDANSETRON HCL 4 MG/2ML IJ SOLN
4.0000 mg | Freq: Once | INTRAMUSCULAR | Status: AC
  Administered 2015-02-04: 4 mg via INTRAVENOUS
  Filled 2015-02-04: qty 2

## 2015-02-04 MED ORDER — MENTHOL 3 MG MT LOZG
1.0000 | LOZENGE | OROMUCOSAL | Status: DC | PRN
  Administered 2015-02-04 – 2015-02-05 (×3): 3 mg via ORAL
  Filled 2015-02-04: qty 9

## 2015-02-04 MED ORDER — MORPHINE SULFATE (PF) 4 MG/ML IV SOLN
4.0000 mg | Freq: Once | INTRAVENOUS | Status: AC
  Administered 2015-02-04: 4 mg via INTRAVENOUS

## 2015-02-04 MED ORDER — MORPHINE SULFATE (PF) 2 MG/ML IV SOLN
2.0000 mg | INTRAVENOUS | Status: DC | PRN
  Administered 2015-02-04 (×2): 2 mg via INTRAVENOUS
  Filled 2015-02-04 (×2): qty 1

## 2015-02-04 NOTE — ED Notes (Signed)
Called CT, pt. Finished contrast.

## 2015-02-04 NOTE — Consult Note (Signed)
[place] at [facility] Internal medicine consultation note   PATIENT NAME: [name]    MR#:  [id number]  DATE OF BIRTH:  August 21, 1950  DATE OF ADMISSION:  02/04/2015  PRIMARY CARE PHYSICIAN: Adin Hector, MD   REQUESTING/REFERRING PHYSICIAN: Dr. Marina Gravel  CHIEF COMPLAINT:   Chief Complaint  Patient presents with  . Abdominal Pain    HISTORY OF PRESENT ILLNESS:  [name]  is a 64 y.o. female with a known history listed below who presents with small bowel obstruction on 12/17. She describes acute onset of left lower quadrant pain and swelling, nausea without vomiting and decreased production from her ostomy. She has a complicated abdominal surgical history and is being followed closely by the surgical service for potential need for surgical intervention. She has moderate aortic valve stenosis and has been seen and cleared for surgery today by cardiology service. Internal medicine has been consulted for management of chronic medical conditions as well as further operative clearance.  PAST MEDICAL HISTORY:   Past Medical History  Diagnosis Date  . Diabetes mellitus without complication ([name])   . GI bleed   . Aortic valve stenosis   . Hyperlipidemia   . Hypertension   . Diverticulitis large intestine   . Obesity   . Shortness of breath dyspnea   . Hyperthyroidism   . Anxiety   . Arthritis   . Neuropathy in diabetes ([name])   . Anemia   . Vertigo   . Bowel obstruction ([name])     PAST SURGICAL HISTORY:   Past Surgical History  Procedure Laterality Date  . Colectomy    . Laparotomy closure of cecal perforation  05/09/2013    Dr. Marina Gravel  . Tonsillectomy    . Back surgery      spur frmoved from lower back  . Cardiac catheterization    . Abdominal hysterectomy      SOCIAL HISTORY:   Social History  Substance Use Topics  . Smoking status: Former Smoker -- 0.25 packs/day for 1 years    Types: Cigarettes    Quit date:  04/18/2013  . Smokeless tobacco: Never Used  . Alcohol Use: No    FAMILY HISTORY:   Family History  Problem Relation Age of Onset  . Diabetes Mother   . Hypertension Father     DRUG ALLERGIES:  No Known Allergies  REVIEW OF SYSTEMS:   Review of Systems  Constitutional: Positive for chills and malaise/fatigue. Negative for fever and weight loss.  HENT: Positive for ear pain. Negative for congestion and hearing loss.   Eyes: Negative for blurred vision and pain.  Respiratory: Negative for cough, hemoptysis, sputum production, shortness of breath and stridor.   Cardiovascular: Negative for chest pain, palpitations, orthopnea and leg swelling.  Gastrointestinal: Positive for nausea, abdominal pain and constipation. Negative for vomiting, diarrhea and blood in stool.  Genitourinary: Negative for dysuria and frequency.  Musculoskeletal: Positive for neck pain. Negative for myalgias, back pain and joint pain.  Skin: Negative for rash.  Neurological: Negative for focal weakness, loss of consciousness and headaches.  Endo/Heme/Allergies: Does not bruise/bleed easily.  Psychiatric/Behavioral: Negative for depression and hallucinations. The patient is not nervous/anxious.     MEDICATIONS AT HOME:   Prior to Admission medications   Medication Sig Start Date End Date Taking? Authorizing Provider  atorvastatin (LIPITOR) 40 MG tablet Take 40 mg by mouth at bedtime. 07/22/14  Yes Historical Provider, MD  clobetasol cream (TEMOVATE) 0.05 % Apply  1 application topically 2 (two) times daily as needed (rash).  07/20/14  Yes Historical Provider, MD  DULoxetine (CYMBALTA) 30 MG capsule Take 30 mg by mouth daily.  08/12/14  Yes Historical Provider, MD  glimepiride (AMARYL) 4 MG tablet Take 4 mg by mouth 2 (two) times daily.  08/12/14  Yes Historical Provider, MD  levothyroxine (SYNTHROID, LEVOTHROID) 88 MCG tablet Take 88 mcg by mouth daily before breakfast.  07/19/14  Yes Historical Provider, MD   metoprolol succinate (TOPROL-XL) 25 MG 24 hr tablet Take 25 mg by mouth daily. 07/20/14  Yes Historical Provider, MD  nystatin (MYCOSTATIN/NYSTOP) 100000 UNIT/GM POWD Apply 1 Tube topically 2 (two) times daily as needed (rash).  08/19/14  Yes Historical Provider, MD  telmisartan (MICARDIS) 80 MG tablet Take 80 mg by mouth daily.   Yes Historical Provider, MD  bisacodyl (DULCOLAX) 5 MG EC tablet Take 4 tablets (20 mg total) by mouth once. Please see surgery prep instructions Patient not taking: Reported on 02/04/2015 09/19/14   Sherri Rad, MD  polyethylene glycol powder (GLYCOLAX/MIRALAX) powder Take 255 g by mouth once. Please see surgery prep instructions given at appointment Patient not taking: Reported on 02/04/2015 09/19/14   Sherri Rad, MD  sodium phosphate (FLEET) 7-19 GM/118ML ENEM Place 133 mLs (1 enema total) rectally once. Please see surgery prep instructions Patient not taking: Reported on 02/04/2015 09/19/14   Sherri Rad, MD      VITAL SIGNS:  Blood pressure 184/98, pulse 74, temperature 98.4 F (36.9 C), temperature source Oral, resp. rate 8, height 5\' 3"  (1.6 m), weight 97.523 kg (215 lb), SpO2 96 %.  PHYSICAL EXAMINATION:  GENERAL:  64 y.o.-year-old patient lying in the bed with no acute distress. Obese EYES: Pupils equal, round, reactive to light and accommodation. No scleral icterus. Extraocular muscles intact.  HEENT: Head atraumatic, normocephalic. Oropharynx and nasopharynx clear. Mucous membranes are dry NECK:  Supple, no jugular venous distention. No thyroid enlargement, no tenderness. No lymphadenopathy range of motion is normal LUNGS: Normal breath sounds bilaterally, no wheezing, rales,rhonchi or crepitation. No use of accessory muscles of respiration.  CARDIOVASCULAR: S1, S2 normal. 4/6 systolic ejection murmur, no rubs, or gallops.  ABDOMEN: Obese, midline scar well-healed, left lower quadrant ostomy with surrounding swelling and firmness, slightly tender, rest of the abdomen  is soft and nontender, there is nothing in the ostomy bag at this time, diminished bowel sounds EXTREMITIES: No pedal edema, cyanosis, or clubbing. Pulses 1+ NEUROLOGIC: Cranial nerves II through XII are intact. Muscle strength 5/5 in all extremities. Sensation intact. Gait not checked.  PSYCHIATRIC: The patient is alert and oriented x 3. Calm and appropriate SKIN: No obvious rash, lesion, or ulcer.   LABORATORY PANEL:   CBC  Recent Labs Lab 02/04/15 0203  WBC 12.0*  HGB 13.0  HCT 41.8  PLT 238   ------------------------------------------------------------------------------------------------------------------  Chemistries   Recent Labs Lab 02/04/15 0203  NA 138  K 5.1  CL 107  CO2 22  GLUCOSE 250*  BUN 29*  CREATININE 1.90*  CALCIUM 8.3*  AST 20  ALT 13*  ALKPHOS 97  BILITOT 0.9   ------------------------------------------------------------------------------------------------------------------  Cardiac Enzymes No results for input(s): TROPONINI in the last 168 hours. ------------------------------------------------------------------------------------------------------------------  RADIOLOGY:  Ct Abdomen Pelvis W Contrast  02/04/2015  CLINICAL DATA:  Severe nausea and abdominal pain. History of diverticulitis, GI bleed, bowel obstruction, colectomy, cecal perforation, and hysterectomy. Technically issues with the scanner necessitated moving the patient to a different scanner for delayed imaging peer EXAM: CT  ABDOMEN AND PELVIS WITH CONTRAST TECHNIQUE: Multidetector CT imaging of the abdomen and pelvis was performed using the standard protocol following bolus administration of intravenous contrast. CONTRAST:  8mL OMNIPAQUE IOHEXOL 300 MG/ML  SOLN COMPARISON:  07/09/2013 FINDINGS: The lung bases are clear.  Coronary artery calcification. Surgical absence of the gallbladder. No bile duct dilatation. The liver, spleen, pancreas, adrenal glands, kidneys, abdominal aorta, and  inferior vena cava are unremarkable. 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 There is mild proximal dilatation of small bowel up to the level of the hernia with decompressed distal small bowel. Contrast material only flows through to the hernia. There is fluid in the hernia with mesenteric stranding suggesting possible incarceration. Can't exclude vascular insult. Colon is decompressed outside of the hernia. Diverticulosis of the colon. No free air or free fluid in the abdomen. Pelvis: No pelvic mass or lymphadenopathy. Bladder wall is not thickened. Appendix is normal. Degenerative changes in the spine and hips. Slight anterior subluxation of L4 on L5. No destructive bone lesions. IMPRESSION: There is a large left lower quadrant peristomal hernia containing small bowel. Small bowel proximal to the hernia appear mildly dilated. Small bowel loops within the hernia are dilated. Distal small bowel is decompressed. There is infiltration and edema in the mesentery within the hernia. Findings suggest incarceration with small bowel obstruction and possible vascular insult. These results were called by telephone at the time of interpretation on 02/04/2015 at 4:47 am to Dr. Hinda Kehr , who verbally acknowledged these results. Electronically Signed   By: Lucienne Capers M.D.   On: 02/04/2015 04:51    EKG:   Orders placed or performed during the hospital encounter of 02/04/15  . ED EKG  . ED EKG  . EKG 12-Lead  . EKG 12-Lead    IMPRESSION AND PLAN:   #1 small bowel obstruction, possible incarceration of small bowel in the periostomal hernia: - Plan per surgery - NG tube in place at this time  #2 diabetes mellitus type 2: - Check hemoglobin A1c - She is currently nothing by mouth due to SBO, receiving D5 - Start sliding scale insulin sensitive. Hold Amaryl  #3 hypertension: - Continue metoprolol and Micardis - Continue pain  control  #4 hyperkalemia - Monitor closely, Micardis will increase potassium, will discontinue the potassium in IV fluids  #5 chronic kidney disease stage III - Creatinine baseline is 1.5-1.9, near baseline - Continue IV hydration  #6 hypothyroidism - Check TSH, continue Synthroid  #7 hyperlipidemia - Hold statin for now  #8 preop clearance: She has been seen by cardiology and from cardiac standpoint is cleared for surgery. No other acute medical concerns at this time. Today we'll work towards blood pressure control and normalize her potassium.  Thank you for this consultation. We will continue to follow with you  All the records are reviewed and case discussed with ED provider. Management plans discussed with the patient, family and they are in agreement.  CODE STATUS: Full   TOTAL TIME TAKING CARE OF THIS PATIENT: 44minutes.  Greater than 50% of time spent in coordination of care and counseling.  Myrtis Ser M.D on 02/04/2015 at 10:38 AM  Between 7am to 6pm - Pager - [phone]  After 6pm go to [email or web address] - password [email or web address] [facility]  [place] [facility]  Office  [phone]  CC: Primary care physician; Adin Hector, MD

## 2015-02-04 NOTE — ED Notes (Signed)
Pt reports sudden onset of left sided abd pain. Pt reports swelling to the area and pain around colostomy bag. Pt reports nausea and feeling the need to vomit but is not able to vomit. Abd is hard and distended, family reports it does not look "normal". Pt reports colostomy contents appear normal and that contents are new since leaving the house this evening.

## 2015-02-04 NOTE — H&P (Signed)
Patient ID: [name], female   DOB: 04/14/1950, 64 y.o.   MRN: [id number]  Chief Complaint  Patient presents with  . Abdominal Pain    HPI Location, Quality, Duration, Severity, Timing, Context, Modifying Factors, Associated Signs and Symptoms.  [name] is a 64 y.o. female. Who presents with the sudden onset of left lower quadrant abdominal pain and mild nausea but no vomiting starting Friday late afternoon and progressing over the course of the evening prompting her and her daughter to visit the emergency room for further evaluation and treatment. She states that the left side her abdomen became acutely more swollen within the last 12-24 hours. Her ostomy continues to function. She is passing gas via the ostomy.  Her past surgical history dates back to March 2015 when she presented with a large bowel obstruction secondary to a sigmoid diverticular stricture. Concurrently, she had a microperforation of the cecum. She underwent a Hartmann's procedure and a patch of omentum was placed over the colonic perforation. Postoperatively, the patient did remarkably well despite the fact that she had long-standing midline wound issues requiring multiple wound VAC changes and care at the wound care center. Several months after this the patient presented back to the emergency room with lower GI bleeding and underwent embolization of bleeding site within the hepatic flexure. Bleeding at that time was deemed secondary to diverticular disease.  Since the episode of GI bleeding the patient's had no further bleeding.  She had a colonoscopy at the time of her bleeding demonstrated no evidence of mass lesion. She has a brother recently died of metastatic colon cancer in the summer.  I saw her in July of this year for consideration of colostomy reversal.  At that time I felt she had a parastomal hernia. No imaging was obtained. I had seen her in the office multiple times prior and she was unsure as to whether she  wanted the operation or not. Surgery was planned for August this year however the operation was canceled due to inability for medical clearance at that time. I have not seen her since that time nor has she contacted the office since that time.  Past Medical History  Diagnosis Date  . Diabetes mellitus without complication ([place])   . GI bleed   . Aortic valve stenosis   . Hyperlipidemia   . Hypertension   . Diverticulitis large intestine   . Obesity   . Shortness of breath dyspnea   . Hyperthyroidism   . Anxiety   . Arthritis   . Neuropathy in diabetes ([place])   . Anemia   . Vertigo   . Bowel obstruction [facility])     Past Surgical History  Procedure Laterality Date  . Colectomy    . Laparotomy closure of cecal perforation  05/09/2013    Dr. Marina Gravel  . Tonsillectomy    . Back surgery      spur frmoved from lower back  . Cardiac catheterization    . Abdominal hysterectomy      Family History  Problem Relation Age of Onset  . Diabetes Mother   . Hypertension Father     Social History Social History  Substance Use Topics  . Smoking status: Former Smoker -- 0.25 packs/day for 1 years    Types: Cigarettes    Quit date: 04/18/2013  . Smokeless tobacco: Never Used  . Alcohol Use: No    No Known Allergies  Current Facility-Administered Medications  Medication Dose Route Frequency Provider Last Rate  Last Dose  . morphine 2 MG/ML injection 2 mg  2 mg Intravenous Q2H PRN Sherri Rad, MD      . piperacillin-tazobactam (ZOSYN) IVPB 3.375 g  3.375 g Intravenous Once Hinda Kehr, MD 12.5 mL/hr at 02/04/15 0612 3.375 g at 02/04/15 8937   Current Outpatient Prescriptions  Medication Sig Dispense Refill  . atorvastatin (LIPITOR) 40 MG tablet Take 40 mg by mouth at bedtime.  2  . clobetasol cream (TEMOVATE) 3.42 % Apply 1 application topically 2 (two) times daily as needed (rash).   2  . DULoxetine (CYMBALTA) 30 MG capsule Take 30 mg by mouth daily.   11  . glimepiride (AMARYL) 4 MG  tablet Take 4 mg by mouth 2 (two) times daily.   3  . levothyroxine (SYNTHROID, LEVOTHROID) 88 MCG tablet Take 88 mcg by mouth daily before breakfast.   2  . metoprolol succinate (TOPROL-XL) 25 MG 24 hr tablet Take 25 mg by mouth daily.  2  . nystatin (MYCOSTATIN/NYSTOP) 100000 UNIT/GM POWD Apply 1 Tube topically 2 (two) times daily as needed (rash).   3  . telmisartan (MICARDIS) 80 MG tablet Take 80 mg by mouth daily.    . bisacodyl (DULCOLAX) 5 MG EC tablet Take 4 tablets (20 mg total) by mouth once. Please see surgery prep instructions (Patient not taking: Reported on 02/04/2015) 4 tablet 0  . polyethylene glycol powder (GLYCOLAX/MIRALAX) powder Take 255 g by mouth once. Please see surgery prep instructions given at appointment (Patient not taking: Reported on 02/04/2015) 255 g 0  . sodium phosphate (FLEET) 7-19 GM/118ML ENEM Place 133 mLs (1 enema total) rectally once. Please see surgery prep instructions (Patient not taking: Reported on 02/04/2015) 1 Bottle 0     Blood pressure 121/100, pulse 73, temperature 97.5 F (36.4 C), temperature source Oral, resp. rate 20, height _0  (1.6 m), weight 215 lb (97.523 kg), SpO2 97 %.  Results for orders placed or performed during the hospital encounter of 02/04/15 (from the past 48 hour(s))  Lipase, blood     Status: None   Collection Time: 02/04/15  2:03 AM  Result Value Ref Range   Lipase 21 11 - 51 U/L  Comprehensive metabolic panel     Status: Abnormal   Collection Time: 02/04/15  2:03 AM  Result Value Ref Range   Sodium 138 135 - 145 mmol/L   Potassium 5.1 3.5 - 5.1 mmol/L    Comment: HEMOLYSIS AT THIS LEVEL MAY AFFECT RESULT   Chloride 107 101 - 111 mmol/L   CO2 22 22 - 32 mmol/L   Glucose, Bld 250 (H) 65 - 99 mg/dL   BUN 29 (H) 6 - 20 mg/dL   Creatinine, Ser 1.90 (H) 0.44 - 1.00 mg/dL   Calcium 8.3 (L) 8.9 - 10.3 mg/dL   Total Protein 7.4 6.5 - 8.1 g/dL   Albumin 3.7 3.5 - 5.0 g/dL   AST 20 15 - 41 U/L    Comment: HEMOLYSIS AT  THIS LEVEL MAY AFFECT RESULT   ALT 13 (L) 14 - 54 U/L   Alkaline Phosphatase 97 38 - 126 U/L   Total Bilirubin 0.9 0.3 - 1.2 mg/dL    Comment: HEMOLYSIS AT THIS LEVEL MAY AFFECT RESULT   GFR calc non Af Amer 27 (L) >60 mL/min   GFR calc Af Amer 31 (L) >60 mL/min    Comment: (NOTE) The eGFR has been calculated using the CKD EPI equation. This calculation has not been validated in all clinical  situations. eGFR's persistently <60 mL/min signify possible Chronic Kidney Disease.    Anion gap 9 5 - 15  CBC     Status: Abnormal   Collection Time: 02/04/15  2:03 AM  Result Value Ref Range   WBC 12.0 (H) 3.6 - 11.0 K/uL   RBC 5.00 3.80 - 5.20 MIL/uL   Hemoglobin 13.0 12.0 - 16.0 g/dL   HCT 41.8 35.0 - 47.0 %   MCV 83.6 80.0 - 100.0 fL   MCH 25.9 (L) 26.0 - 34.0 pg   MCHC 31.0 (L) 32.0 - 36.0 g/dL   RDW 14.8 (H) 11.5 - 14.5 %   Platelets 238 150 - 440 K/uL  Lactic acid, plasma     Status: None   Collection Time: 02/04/15  2:24 AM  Result Value Ref Range   Lactic Acid, Venous 1.6 0.5 - 2.0 mmol/L  Lactic acid, plasma     Status: None   Collection Time: 02/04/15  5:15 AM  Result Value Ref Range   Lactic Acid, Venous 1.4 0.5 - 2.0 mmol/L  Urinalysis complete, with microscopic ([facility] only)     Status: Abnormal   Collection Time: 02/04/15  5:35 AM  Result Value Ref Range   Color, Urine STRAW (A) YELLOW   APPearance CLEAR (A) CLEAR   Glucose, UA 150 (A) NEGATIVE mg/dL   Bilirubin Urine NEGATIVE NEGATIVE   Ketones, ur NEGATIVE NEGATIVE mg/dL   Specific Gravity, Urine 1.019 1.005 - 1.030   Hgb urine dipstick 1+ (A) NEGATIVE   pH 6.0 5.0 - 8.0   Protein, ur 100 (A) NEGATIVE mg/dL   Nitrite NEGATIVE NEGATIVE   Leukocytes, UA NEGATIVE NEGATIVE   RBC / HPF 0-5 0 - 5 RBC/hpf   WBC, UA NONE SEEN 0 - 5 WBC/hpf   Bacteria, UA RARE (A) NONE SEEN   Squamous Epithelial / LPF 0-5 (A) NONE SEEN   Mucous PRESENT   Type and screen [facility]     Status: None (Preliminary  result)   Collection Time: 02/04/15  6:14 AM  Result Value Ref Range   ABO/RH(D) PENDING    Antibody Screen PENDING    Sample Expiration 02/07/2015    Ct Abdomen Pelvis W Contrast  02/04/2015  CLINICAL DATA:  Severe nausea and abdominal pain. History of diverticulitis, GI bleed, bowel obstruction, colectomy, cecal perforation, and hysterectomy. Technically issues with the scanner necessitated moving the patient to a different scanner for delayed imaging peer EXAM: CT ABDOMEN AND PELVIS WITH CONTRAST TECHNIQUE: Multidetector CT imaging of the abdomen and pelvis was performed using the standard protocol following bolus administration of intravenous contrast. CONTRAST:  30m OMNIPAQUE IOHEXOL 300 MG/ML  SOLN COMPARISON:  07/09/2013 FINDINGS: The lung bases are clear.  Coronary artery calcification. Surgical absence of the gallbladder. No bile duct dilatation. The liver, spleen, pancreas, adrenal glands, kidneys, abdominal aorta, and inferior vena cava are unremarkable. 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 There is mild proximal dilatation of small bowel up to the level of the hernia with decompressed distal small bowel. Contrast material only flows through to the hernia. There is fluid in the hernia with mesenteric stranding suggesting possible incarceration. Can't exclude vascular insult. Colon is decompressed outside of the hernia. Diverticulosis of the colon. No free air or free fluid in the abdomen. Pelvis: No pelvic mass or lymphadenopathy. Bladder wall is not thickened. Appendix is normal. Degenerative changes in the spine and hips. Slight anterior  subluxation of L4 on L5. No destructive bone lesions. IMPRESSION: There is a large left lower quadrant peristomal hernia containing small bowel. Small bowel proximal to the hernia appear mildly dilated. Small bowel loops within the hernia are dilated. Distal small bowel is decompressed. There  is infiltration and edema in the mesentery within the hernia. Findings suggest incarceration with small bowel obstruction and possible vascular insult. These results were called by telephone at the time of interpretation on 02/04/2015 at 4:47 am to Dr. Hinda Kehr , who verbally acknowledged these results. Electronically Signed   By: Lucienne Capers M.D.   On: 02/04/2015 04:51    Review of Systems  Constitutional: Negative for fever, chills and weight loss.  HENT: Negative.   Respiratory: Negative.   Cardiovascular: Negative for chest pain and palpitations.  Gastrointestinal: Positive for heartburn, nausea and abdominal pain. Negative for vomiting, diarrhea and constipation.  Genitourinary: Negative.   Musculoskeletal: Negative.   Skin: Negative.   Psychiatric/Behavioral: Negative.     Physical Exam  Constitutional: She is oriented to person, place, and time and well-developed, well-nourished, and in no distress. No distress.  HENT:  Head: Normocephalic and atraumatic.  Eyes: Conjunctivae are normal. Pupils are equal, round, and reactive to light.  Cardiovascular: Normal rate and regular rhythm.   Murmur heard. Pulmonary/Chest: Effort normal and breath sounds normal. No respiratory distress.  Abdominal: Soft. Bowel sounds are normal. A hernia is present.    Neurological: She is alert and oriented to person, place, and time.  Skin: Skin is warm and dry. She is not diaphoretic.  Psychiatric: Mood, memory, affect and judgment normal.    Data Reviewed  I have personally reviewed the patient's imaging, laboratory findings and medical records.    Assessment    64 year old female with a complicated problem. She has an incarcerated peristomal hernia in unprepped bowel. Multiple surgical options.      Plan    Admission.  nasogastric tube decompression. I will obtain a medical and cardiology consult.  If we can allow the bowel obstruction to resolve itself and the patient can be  prepped ideally takedown of the ostomy with repair of the parastomal hernia with or without a diverting loop ileostomy would be the best option. Relocation of the ostomy in her body habitus is challenging.     Time spent with patient was 60 minutes, with more than 50% of the time spent counseling and coordinating care of patient.     Sherri Rad MD, FACS 02/04/2015, 6:38 AM

## 2015-02-04 NOTE — Consult Note (Signed)
[facility] Consultation Note  Patient ID: [name], MRN: [id number], DOB/AGE: 64-Feb-1952 64 y.o. Admit date: 02/04/2015   Date of Consult: 02/04/2015 Primary Physician: Adin Hector, MD Primary Cardiologist: Nehemiah Massed  Chief Complaint:  Chief Complaint  Patient presents with  . Abdominal Pain   Reason for Consult: aortic valve stenosis  HPI: 64 y.o. female with essential hypertension mixed hyperlipidemia and diabetes with complication having a significant abdominal discomfort and bowel obstruction due to a hernia for which the patient has an aortic valve murmur. The patient has been doing fairly well recently with no evidence of chest pain shortness of breath weakness fatigue or syncope or any other cardiovascular symptoms. The patient has had a recent echocardiogram showing moderate aortic valve stenosis with normal LV systolic function with ejection fraction of 50%. She also had an evaluation of exercise tolerance showing reasonable exercise tolerance and no evidence of myocardial ischemia or rhythm disturbances. Therefore the patient is stable at this time from the cardiac standpoint and will be at low risk for cardiovascular complication with surgical intervention.  Past Medical History  Diagnosis Date  . Diabetes mellitus without complication ([place])   . GI bleed   . Aortic valve stenosis   . Hyperlipidemia   . Hypertension   . Diverticulitis large intestine   . Obesity   . Shortness of breath dyspnea   . Hyperthyroidism   . Anxiety   . Arthritis   . Neuropathy in diabetes ([place])   . Anemia   . Vertigo   . Bowel obstruction [facility])       Surgical History:  Past Surgical History  Procedure Laterality Date  . Colectomy    . Laparotomy closure of cecal perforation  05/09/2013    Dr. Marina Gravel  . Tonsillectomy    . Back surgery      spur frmoved from lower back  . Cardiac catheterization    . Abdominal hysterectomy       Home Meds: Prior to Admission  medications   Medication Sig Start Date End Date Taking? Authorizing Provider  atorvastatin (LIPITOR) 40 MG tablet Take 40 mg by mouth at bedtime. 07/22/14  Yes Historical Provider, MD  clobetasol cream (TEMOVATE) [id number] % Apply 1 application topically 2 (two) times daily as needed (rash).  07/20/14  Yes Historical Provider, MD  DULoxetine (CYMBALTA) 30 MG capsule Take 30 mg by mouth daily.  08/12/14  Yes Historical Provider, MD  glimepiride (AMARYL) 4 MG tablet Take 4 mg by mouth 2 (two) times daily.  08/12/14  Yes Historical Provider, MD  levothyroxine (SYNTHROID, LEVOTHROID) 88 MCG tablet Take 88 mcg by mouth daily before breakfast.  07/19/14  Yes Historical Provider, MD  metoprolol succinate (TOPROL-XL) 25 MG 24 hr tablet Take 25 mg by mouth daily. 07/20/14  Yes Historical Provider, MD  nystatin (MYCOSTATIN/NYSTOP) 100000 UNIT/GM POWD Apply 1 Tube topically 2 (two) times daily as needed (rash).  08/19/14  Yes Historical Provider, MD  telmisartan (MICARDIS) 80 MG tablet Take 80 mg by mouth daily.   Yes Historical Provider, MD  bisacodyl (DULCOLAX) 5 MG EC tablet Take 4 tablets (20 mg total) by mouth once. Please see surgery prep instructions Patient not taking: Reported on 02/04/2015 09/19/14   Sherri Rad, MD  polyethylene glycol powder (GLYCOLAX/MIRALAX) powder Take 255 g by mouth once. Please see surgery prep instructions given at appointment Patient not taking: Reported on 02/04/2015 09/19/14   Sherri Rad, MD  sodium phosphate (FLEET) 7-19 GM/118ML ENEM Place  133 mLs (1 enema total) rectally once. Please see surgery prep instructions Patient not taking: Reported on 02/04/2015 09/19/14   Sherri Rad, MD    Inpatient Medications:  . enoxaparin (LOVENOX) injection  40 mg Subcutaneous Q24H  . irbesartan  37.5 mg Oral Daily  . metoprolol succinate  25 mg Oral Daily  . pantoprazole (PROTONIX) IV  40 mg Intravenous Q24H  . piperacillin-tazobactam (ZOSYN)  IV  3.375 g Intravenous Once   . dextrose 5 % and 0.45 %  NaCl with KCl 20 mEq/L 125 mL/hr at 02/04/15 [id number]    Allergies: No Known Allergies  Social History   Social History  . Marital Status: Divorced    Spouse Name: N/A  . Number of Children: N/A  . Years of Education: N/A   Occupational History  . Not on file.   Social History Main Topics  . Smoking status: Former Smoker -- 0.25 packs/day for 1 years    Types: Cigarettes    Quit date: 04/18/2013  . Smokeless tobacco: Never Used  . Alcohol Use: No  . Drug Use: No  . Sexual Activity: Not on file   Other Topics Concern  . Not on file   Social History Narrative     Family History  Problem Relation Age of Onset  . Diabetes Mother   . Hypertension Father      Review of Systems Positive for heart murmur dominant pain Negative for: General:  chills, fever, night sweats or weight changes.  Cardiovascular: PND orthopnea syncope dizziness  Dermatological skin lesions rashes Respiratory: Cough congestion Urologic: Frequent urination urination at night and hematuria Abdominal: negative for  vomiting, diarrhea, bright red blood per rectum, melena, or hematemesis Neurologic: negative for visual changes, and/or hearing changes  All other systems reviewed and are otherwise negative except as noted above.  Labs: No results for input(s): CKTOTAL, CKMB, TROPONINI in the last 72 hours. Lab Results  Component Value Date   WBC 12.0* 02/04/2015   HGB 13.0 02/04/2015   HCT 41.8 02/04/2015   MCV 83.6 02/04/2015   PLT 238 02/04/2015    Recent Labs Lab 02/04/15 0203  NA 138  K 5.1  CL 107  CO2 22  BUN 29*  CREATININE 1.90*  CALCIUM 8.3*  PROT 7.4  BILITOT 0.9  ALKPHOS 97  ALT 13*  AST 20  GLUCOSE 250*   No results found for: CHOL, HDL, LDLCALC, TRIG No results found for: DDIMER  Radiology/Studies:  Ct Abdomen Pelvis W Contrast  02/04/2015  CLINICAL DATA:  Severe nausea and abdominal pain. History of diverticulitis, GI bleed, bowel obstruction, colectomy, cecal  perforation, and hysterectomy. Technically issues with the scanner necessitated moving the patient to a different scanner for delayed imaging peer EXAM: CT ABDOMEN AND PELVIS WITH CONTRAST TECHNIQUE: Multidetector CT imaging of the abdomen and pelvis was performed using the standard protocol following bolus administration of intravenous contrast. CONTRAST:  43mL OMNIPAQUE IOHEXOL 300 MG/ML  SOLN COMPARISON:  07/09/2013 FINDINGS: The lung bases are clear.  Coronary artery calcification. Surgical absence of the gallbladder. No bile duct dilatation. The liver, spleen, pancreas, adrenal glands, kidneys, abdominal aorta, and inferior vena cava are unremarkable. 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 There is mild proximal dilatation of small bowel up to the level of the hernia with decompressed distal small bowel. Contrast material only flows through to the hernia. There is fluid in the hernia with mesenteric stranding suggesting possible incarceration.  Can't exclude vascular insult. Colon is decompressed outside of the hernia. Diverticulosis of the colon. No free air or free fluid in the abdomen. Pelvis: No pelvic mass or lymphadenopathy. Bladder wall is not thickened. Appendix is normal. Degenerative changes in the spine and hips. Slight anterior subluxation of L4 on L5. No destructive bone lesions. IMPRESSION: There is a large left lower quadrant peristomal hernia containing small bowel. Small bowel proximal to the hernia appear mildly dilated. Small bowel loops within the hernia are dilated. Distal small bowel is decompressed. There is infiltration and edema in the mesentery within the hernia. Findings suggest incarceration with small bowel obstruction and possible vascular insult. These results were called by telephone at the time of interpretation on 02/04/2015 at 4:47 am to Dr. Hinda Kehr , who verbally acknowledged these results. Electronically  Signed   By: Lucienne Capers M.D.   On: 02/04/2015 04:51    EKG: Normal sinus rhythm  Weights: Filed Weights   02/04/15 0200  Weight: 215 lb (97.523 kg)     Physical Exam: Blood pressure 184/98, pulse 74, temperature 98.4 F (36.9 C), temperature source Oral, resp. rate 8, height 5\' 3"  (1.6 m), weight 215 lb (97.523 kg), SpO2 96 %. Body mass index is 38.09 kg/(m^2). General: Well developed, well nourished, in no acute distress. Head eyes ears nose throat: Normocephalic, atraumatic, sclera non-icteric, no xanthomas, nares are without discharge. No apparent thyromegaly and/or mass  Lungs: Normal respiratory effort.  no wheezes, no rales, no rhonchi.  Heart: RRR with normal S1 soft S2. 3/6 aortic murmur gallop, no rub, PMI is normal size and placement, carotid upstroke normal with  bruit, jugular venous pressure is normal Abdomen: Soft, tender,  distended with normoactive bowel sounds. No hepatomegaly. No rebound/guarding. No obvious abdominal masses. Abdominal aorta is normal size without bruit Extremities trace edema. no cyanosis, no clubbing, no ulcers  Peripheral : 2+ bilateral upper extremity pulses, 2+ bilateral femoral pulses, 2+ bilateral dorsal pedal pulse Neuro: Alert and oriented. No facial asymmetry. No focal deficit. Moves all extremities spontaneously. Musculoskeletal: Normal muscle tone without kyphosis Psych:  Responds to questions appropriately with a normal affect.    Assessment: 64 year old female with known moderate aortic valve stenosis and no current evidence of heart failure or angina or syncope stable from the cardiac standpoint with essential hypertension mixed hyperlipidemia not requiring further intervention at this time having bowel obstruction at lowest risk possible for cardiovascular complication surgery  Plan: 1. Continue current medical regimen for hypertension control including angiotensin receptor blocker and metoprolol 2. No further cardiac  intervention with stable moderate aortic valve stenosis 3. Proceed to surgical intervention as necessary without restriction to surgery and/or rehabilitation 4. Further treatment options if patient has other further symptoms  Signed, Corey Skains M.D. [facility] 02/04/2015, 9:47 AM

## 2015-02-04 NOTE — Progress Notes (Signed)
Surgery  Feels better Filed Vitals:   02/04/15 0725 02/04/15 1100 02/04/15 1130 02/04/15 2016  BP: 184/98 170/88 150/72 141/78  Pulse: 74 72  74  Temp: 98.4 F (36.9 C) 98.1 F (36.7 C)  98.4 F (36.9 C)  TempSrc: Oral Oral  Oral  Resp: 18 17  18   Height:      Weight:      SpO2: 96% 97%  98%    I/O last 3 completed shifts: In: 336.7 [I.V.:246.7; NG/GT:90] Out: 1200 [Urine:800; Emesis/NG output:400] Total I/O In: 147.6 [I.V.:147.6] Out: -    Abd softer, mass looks to be smaller Ostomy pink, no air or materials in bag.  IMP  SBO, parastomal hernia  Plan:  If resolved, needs bowel prep, elective repair to include takedown ostomy, if does not resolve may need LOA, repair parastomal hernia with biologic mesh vs LOA with takedown and diverting loop ileostomy. Overall difficult problem.  Patient aware of risks involved.  Appreciate IM and Cards input.

## 2015-02-04 NOTE — ED Provider Notes (Signed)
[facility] Emergency Department Provider Note  ____________________________________________  Time seen: Approximately 3:39 AM  I have reviewed the triage vital signs and the nursing notes.   HISTORY  Chief Complaint Abdominal Pain    HPI [name] is a 64 y.o. female with a history of morbid obesity and emergent colostomyplaced by Dr. Felton Clinton approximately 1 year ago due to a small bowel obstruction.  She presents with severe abdominal pain as well as severe nausea that started acutely several hours ago.  She reports that there is "hardness" surrounding the colostomy bag which is not normal.  She has had good output in the bag and it has been a normal consistency.  She denies fever/chills, chest pain, shortness of breath.  There is her nausea has been severe she has not had any vomiting.  Her pain is severe and nothing makes it better.  Movement makes it worse.   Past Medical History  Diagnosis Date  . Diabetes mellitus without complication ([place])   . GI bleed   . Aortic valve stenosis   . Hyperlipidemia   . Hypertension   . Diverticulitis large intestine   . Obesity   . Shortness of breath dyspnea   . Hyperthyroidism   . Anxiety   . Arthritis   . Neuropathy in diabetes ([place])   . Anemia   . Vertigo   . Bowel obstruction [facility])     Patient Active Problem List   Diagnosis Date Noted  . Diverticulitis of colon 09/05/2014  . Obesity 09/05/2014    Past Surgical History  Procedure Laterality Date  . Colectomy    . Laparotomy closure of cecal perforation  05/09/2013    Dr. Marina Gravel  . Tonsillectomy    . Back surgery      spur frmoved from lower back  . Cardiac catheterization    . Abdominal hysterectomy      Current Outpatient Rx  Name  Route  Sig  Dispense  Refill  . atorvastatin (LIPITOR) 40 MG tablet   Oral   Take 40 mg by mouth at bedtime.      2   . bisacodyl (DULCOLAX) 5 MG EC tablet   Oral   Take 4 tablets (20 mg total) by mouth  once. Please see surgery prep instructions   4 tablet   0   . clobetasol cream (TEMOVATE) 0.05 %   Topical   Apply 1 application topically 2 (two) times daily.       2   . DULoxetine (CYMBALTA) 30 MG capsule   Oral   Take 30 mg by mouth daily.       11   . glimepiride (AMARYL) 4 MG tablet   Oral   Take 4 mg by mouth daily.       3   . levothyroxine (SYNTHROID, LEVOTHROID) 88 MCG tablet   Oral   Take 88 mcg by mouth daily before breakfast.       2   . metoprolol succinate (TOPROL-XL) 25 MG 24 hr tablet   Oral   Take 25 mg by mouth daily.      2   . nystatin (MYCOSTATIN/NYSTOP) 100000 UNIT/GM POWD   Topical   Apply 1 Tube topically 2 (two) times daily. One application two times daily      3   . polyethylene glycol powder (GLYCOLAX/MIRALAX) powder   Oral   Take 255 g by mouth once. Please see surgery prep instructions given at appointment   255  g   0   . sodium phosphate (FLEET) 7-19 GM/118ML ENEM   Rectal   Place 133 mLs (1 enema total) rectally once. Please see surgery prep instructions   1 Bottle   0   . telmisartan (MICARDIS) 80 MG tablet   Oral   Take 80 mg by mouth daily.           Allergies Review of patient's allergies indicates no known allergies.  Family History  Problem Relation Age of Onset  . Diabetes Mother   . Hypertension Father     Social History Social History  Substance Use Topics  . Smoking status: Former Smoker -- 0.25 packs/day for 1 years    Types: Cigarettes    Quit date: 04/18/2013  . Smokeless tobacco: Never Used  . Alcohol Use: No    Review of Systems Constitutional: No fever/chills Eyes: No visual changes. ENT: No sore throat. Cardiovascular: Denies chest pain. Respiratory: Denies shortness of breath. Gastrointestinal: Very abdominal pain at the site of her colostomy bag surrounding induration.  Severe nausea, no vomiting.  Colostomy output appears consistent with normal. Genitourinary: Negative for  dysuria. Musculoskeletal: Negative for back pain. Skin: Negative for rash. Neurological: Negative for headaches, focal weakness or numbness.  10-point ROS otherwise negative.  ____________________________________________   PHYSICAL EXAM:  VITAL SIGNS: ED Triage Vitals  Enc Vitals Group     BP 02/04/15 0200 109/87 mmHg     Pulse Rate 02/04/15 0200 87     Resp 02/04/15 0200 16     Temp 02/04/15 0200 97.5 F (36.4 C)     Temp Source 02/04/15 0200 Oral     SpO2 02/04/15 0200 100 %     Weight 02/04/15 0200 215 lb (97.523 kg)     Height 02/04/15 0200 5\' 3"  (1.6 m)     Head Cir --      Peak Flow --      Pain Score 02/04/15 0201 10     Pain Loc --      Pain Edu? --      Excl. in [place]? --     Constitutional: Alert and oriented.  Nontoxic but appears very uncomfortable. Eyes: Conjunctivae are normal. PERRL. EOMI. Head: Atraumatic. Nose: No congestion/rhinnorhea. Mouth/Throat: Mucous membranes are moist.  Oropharynx non-erythematous. Neck: No stridor.   Cardiovascular: Normal rate, regular rhythm. Grossly normal heart sounds.  Good peripheral circulation. Respiratory: Normal respiratory effort.  No retractions. Lungs CTAB. Gastrointestinal: Or [place] obese.  Soft and firm with a feeling of induration around her ostomy with severe tenderness to palpation. No distention. No abdominal bruits. No CVA tenderness.  Normal ostomy output in the bag Musculoskeletal: No lower extremity tenderness nor edema.  No joint effusions. Neurologic:  Normal speech and language. No gross focal neurologic deficits are appreciated.  Skin:  Skin is warm, dry and intact. No rash noted. Psychiatric: Mood and affect are normal. Speech and behavior are normal.  ____________________________________________   LABS (all labs ordered are listed, but only abnormal results are displayed)  Labs Reviewed  COMPREHENSIVE METABOLIC PANEL - Abnormal; Notable for the following:    Glucose, Bld 250 (*)    BUN 29 (*)     Creatinine, Ser 1.90 (*)    Calcium 8.3 (*)    ALT 13 (*)    GFR calc non Af Amer 27 (*)    GFR calc Af Amer 31 (*)    All other components within normal limits  CBC - Abnormal; Notable for the  following:    WBC 12.0 (*)    MCH 25.9 (*)    MCHC 31.0 (*)    RDW 14.8 (*)    All other components within normal limits  LIPASE, BLOOD  LACTIC ACID, PLASMA  URINALYSIS COMPLETEWITH MICROSCOPIC ([facility] ONLY)  LACTIC ACID, PLASMA  TYPE AND SCREEN   ____________________________________________  EKG  ED ECG REPORT I, Amauris Debois, the attending physician, personally viewed and interpreted this ECG.  Date: 02/04/2015 EKG Time: 05:11 Rate: 73 Rhythm: normal sinus rhythm QRS Axis: Right axis deviation Intervals: normal ST/T Wave abnormalities: normal Conduction Disutrbances: none Narrative Interpretation: unremarkable  ____________________________________________  RADIOLOGY Bennie Hind, Stokely Jeancharles, personally discussed these images and results by phone with the on-call radiologist and used this discussion as part of my medical decision making.    Ct Abdomen Pelvis W Contrast  02/04/2015  CLINICAL DATA:  Severe nausea and abdominal pain. History of diverticulitis, GI bleed, bowel obstruction, colectomy, cecal perforation, and hysterectomy. Technically issues with the scanner necessitated moving the patient to a different scanner for delayed imaging peer EXAM: CT ABDOMEN AND PELVIS WITH CONTRAST TECHNIQUE: Multidetector CT imaging of the abdomen and pelvis was performed using the standard protocol following bolus administration of intravenous contrast. CONTRAST:  64mL OMNIPAQUE IOHEXOL 300 MG/ML  SOLN COMPARISON:  07/09/2013 FINDINGS: The lung bases are clear.  Coronary artery calcification. Surgical absence of the gallbladder. No bile duct dilatation. The liver, spleen, pancreas, adrenal glands, kidneys, abdominal aorta, and inferior vena cava are unremarkable. There is a left lower quadrant  colostomy with a large peristomal hernia containing colon and small bowel. There is mild proximal dilatation of small bowel up to the level of the hernia with decompressed distal small bowel. Contrast material only flows through to the hernia. There is fluid in the hernia with mesenteric stranding suggesting possible incarceration. Can't exclude vascular insult. Colon is decompressed outside of the hernia. Diverticulosis of the colon. No free air or free fluid in the abdomen. Pelvis: No pelvic mass or lymphadenopathy. Bladder wall is not thickened. Appendix is normal. Degenerative changes in the spine and hips. Slight anterior subluxation of L4 on L5. No destructive bone lesions. IMPRESSION: There is a large left lower quadrant peristomal hernia containing small bowel. Small bowel proximal to the hernia appear mildly dilated. Small bowel loops within the hernia are dilated. Distal small bowel is decompressed. There is infiltration and edema in the mesentery within the hernia. Findings suggest incarceration with small bowel obstruction and possible vascular insult. These results were called by telephone at the time of interpretation on 02/04/2015 at 4:47 am to Dr. Hinda Kehr , who verbally acknowledged these results. Electronically Signed   By: Lucienne Capers M.D.   On: 02/04/2015 04:51    ____________________________________________   PROCEDURES  Procedure(s) performed: None  Critical Care performed: Yes, see critical care note(s)   CRITICAL CARE Performed by: Hinda Kehr   Total critical care time: 30  minutes  Critical care time was exclusive of separately billable procedures and treating other patients.  Critical care was necessary to treat or prevent imminent or life-threatening deterioration.  Critical care was time spent personally by me on the following activities: development of treatment plan with patient and/or surrogate as well as nursing, discussions with consultants,  evaluation of patient's response to treatment, examination of patient, obtaining history from patient or surrogate, ordering and performing treatments and interventions, ordering and review of laboratory studies, ordering and review of radiographic studies, pulse oximetry and re-evaluation of patient's  condition.  ____________________________________________   INITIAL IMPRESSION / ASSESSMENT AND PLAN / ED COURSE  Pertinent labs & imaging results that were available during my care of the patient were reviewed by me and considered in my medical decision making (see chart for details).  Given the patient's presentation with severe abdominal pain, prior surgeries, and abnormal physical exam findings with the firmness surrounding colostomy bag attained an emergent CT scan of her abdomen.  She has chronic kidney disease her GFR was 30 so we continued with the study per protocol.  The radiologist called to let me know that she has what appears to be an incarcerated peristomal hernia.  I give her empiric antibiotics Zosyn 3.375 mg and called Dr. Marina Gravel who is coming to the department to personally evaluate the patient and admit her for further management.  The patient remains hemodynamically stable. ____________________________________________  FINAL CLINICAL IMPRESSION(S) / ED DIAGNOSES  Final diagnoses:  Incarcerated hernia  Abdominal pain, unspecified abdominal location      NEW MEDICATIONS STARTED DURING THIS VISIT:  New Prescriptions   No medications on file     Hinda Kehr, MD 02/04/15 [phone]

## 2015-02-04 NOTE — ED Notes (Signed)
While in triage pt began reporting severe nausea and abd pain and began breathing heavier reporting the pain as worsening. Per protocols medication administered to help relief pts nausea.

## 2015-02-05 LAB — CBC
HEMATOCRIT: 40.3 % (ref 35.0–47.0)
Hemoglobin: 12.6 g/dL (ref 12.0–16.0)
MCH: 26.3 pg (ref 26.0–34.0)
MCHC: 31.2 g/dL — ABNORMAL LOW (ref 32.0–36.0)
MCV: 84.4 fL (ref 80.0–100.0)
Platelets: 237 10*3/uL (ref 150–440)
RBC: 4.77 MIL/uL (ref 3.80–5.20)
RDW: 14.7 % — AB (ref 11.5–14.5)
WBC: 8.8 10*3/uL (ref 3.6–11.0)

## 2015-02-05 LAB — BASIC METABOLIC PANEL
ANION GAP: 6 (ref 5–15)
BUN: 27 mg/dL — AB (ref 6–20)
CO2: 26 mmol/L (ref 22–32)
Calcium: 8.1 mg/dL — ABNORMAL LOW (ref 8.9–10.3)
Chloride: 106 mmol/L (ref 101–111)
Creatinine, Ser: 2.26 mg/dL — ABNORMAL HIGH (ref 0.44–1.00)
GFR calc Af Amer: 25 mL/min — ABNORMAL LOW (ref >60)
GFR, EST NON AFRICAN AMERICAN: 22 mL/min — AB (ref >60)
GLUCOSE: 184 mg/dL — AB (ref 65–99)
POTASSIUM: 4.6 mmol/L (ref 3.5–5.1)
Sodium: 138 mmol/L (ref 135–145)

## 2015-02-05 LAB — PHOSPHORUS: PHOSPHORUS: 4.7 mg/dL — AB (ref 2.5–4.6)

## 2015-02-05 LAB — MAGNESIUM: MAGNESIUM: 2.1 mg/dL (ref 1.7–2.4)

## 2015-02-05 LAB — GLUCOSE, CAPILLARY
GLUCOSE-CAPILLARY: 146 mg/dL — AB (ref 65–99)
GLUCOSE-CAPILLARY: 165 mg/dL — AB (ref 65–99)
Glucose-Capillary: 169 mg/dL — ABNORMAL HIGH (ref 65–99)
Glucose-Capillary: 165 mg/dL — ABNORMAL HIGH (ref 65–99)

## 2015-02-05 MED ORDER — DEXTROSE-NACL 5-0.45 % IV SOLN
INTRAVENOUS | Status: AC
  Administered 2015-02-05 – 2015-02-06 (×2): via INTRAVENOUS

## 2015-02-05 MED ORDER — ENOXAPARIN SODIUM 30 MG/0.3ML ~~LOC~~ SOLN
30.0000 mg | SUBCUTANEOUS | Status: DC
  Administered 2015-02-05 – 2015-02-09 (×5): 30 mg via SUBCUTANEOUS
  Filled 2015-02-05 (×5): qty 0.3

## 2015-02-05 MED ORDER — HYDRALAZINE HCL 50 MG PO TABS
50.0000 mg | ORAL_TABLET | Freq: Three times a day (TID) | ORAL | Status: DC
  Administered 2015-02-05 – 2015-02-06 (×3): 50 mg via ORAL
  Filled 2015-02-05 (×3): qty 1

## 2015-02-05 NOTE — Progress Notes (Signed)
Anticoagulation monitoring(Lovenox):  64yo F  ordered Lovenox 40 mg Q24h  Filed Weights   02/04/15 0200 02/05/15 0500  Weight: 215 lb (97.523 kg) 222 lb 11.2 oz (101.016 kg)   BMI 39.5   Lab Results  Component Value Date   CREATININE 2.26* 02/05/2015   CREATININE 1.90* 02/04/2015   CREATININE 1.93* 09/19/2014   Estimated Creatinine Clearance: 28.5 mL/min (by C-G formula based on Cr of 2.26).   Hemoglobin & Hematocrit     Component Value Date/Time   HGB 12.6 02/05/2015 0637   HGB 9.2* 07/20/2013 0515   HCT 40.3 02/05/2015 0637   HCT 28.3* 07/19/2013 0523     Per Protocol for Patient with estCrcl< 30 ml/min and BMI < 40, will transition to Lovenox 30 mg Q24h     Chinita Greenland PharmD Clinical Pharmacist 02/05/2015

## 2015-02-05 NOTE — Progress Notes (Signed)
CC: Hernia Subjective: Patient reports continued improvement overnight. States her pain is controlled with when necessary pain medications she's not had any nausea or vomiting. NG tube has remained to suction. She has not had any ostomy output yet though.  Objective: Vital signs in last 24 hours: Temp:  [98.4 F (36.9 C)-98.8 F (37.1 C)] 98.8 F (37.1 C) (12/18 0504) Pulse Rate:  [74] 74 (12/18 0504) Resp:  [17-18] 17 (12/18 0504) BP: (141-151)/(72-78) 151/74 mmHg (12/18 0504) SpO2:  [92 %-98 %] 92 % (12/18 0504) Weight:  [101.016 kg (222 lb 11.2 oz)] 101.016 kg (222 lb 11.2 oz) (12/18 0500)    Intake/Output from previous day: 12/17 0701 - 12/18 0700 In: 484.3 [I.V.:394.3; NG/GT:90] Out: 1710 [Urine:1150; Emesis/NG output:560] Intake/Output this shift:    Physical exam:  Gen.: No acute distress Chest: Clear to all fixation Heart: Regular rhythm Abdomen: Very large, soft, mildly tender to palpation around her ostomy site with a question of a reducible parastomal hernia.  Lab Results: CBC   Recent Labs  02/04/15 0203 02/05/15 0637  WBC 12.0* 8.8  HGB 13.0 12.6  HCT 41.8 40.3  PLT 238 237   BMET  Recent Labs  02/04/15 0203 02/05/15 0637  NA 138 138  K 5.1 4.6  CL 107 106  CO2 22 26  GLUCOSE 250* 184*  BUN 29* 27*  CREATININE 1.90* 2.26*  CALCIUM 8.3* 8.1*   PT/INR No results for input(s): LABPROT, INR in the last 72 hours. ABG No results for input(s): PHART, HCO3 in the last 72 hours.  Invalid input(s): PCO2, PO2  Studies/Results: Ct Abdomen Pelvis W Contrast  02/04/2015  CLINICAL DATA:  Severe nausea and abdominal pain. History of diverticulitis, GI bleed, bowel obstruction, colectomy, cecal perforation, and hysterectomy. Technically issues with the scanner necessitated moving the patient to a different scanner for delayed imaging peer EXAM: CT ABDOMEN AND PELVIS WITH CONTRAST TECHNIQUE: Multidetector CT imaging of the abdomen and pelvis was  performed using the standard protocol following bolus administration of intravenous contrast. CONTRAST:  19mL OMNIPAQUE IOHEXOL 300 MG/ML  SOLN COMPARISON:  07/09/2013 FINDINGS: The lung bases are clear.  Coronary artery calcification. Surgical absence of the gallbladder. No bile duct dilatation. The liver, spleen, pancreas, adrenal glands, kidneys, abdominal aorta, and inferior vena cava are unremarkable. 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 There is mild proximal dilatation of small bowel up to the level of the hernia with decompressed distal small bowel. Contrast material only flows through to the hernia. There is fluid in the hernia with mesenteric stranding suggesting possible incarceration. Can't exclude vascular insult. Colon is decompressed outside of the hernia. Diverticulosis of the colon. No free air or free fluid in the abdomen. Pelvis: No pelvic mass or lymphadenopathy. Bladder wall is not thickened. Appendix is normal. Degenerative changes in the spine and hips. Slight anterior subluxation of L4 on L5. No destructive bone lesions. IMPRESSION: There is a large left lower quadrant peristomal hernia containing small bowel. Small bowel proximal to the hernia appear mildly dilated. Small bowel loops within the hernia are dilated. Distal small bowel is decompressed. There is infiltration and edema in the mesentery within the hernia. Findings suggest incarceration with small bowel obstruction and possible vascular insult. These results were called by telephone at the time of interpretation on 02/04/2015 at 4:47 am to Dr. Hinda Kehr , who verbally acknowledged these results. Electronically Signed   By: Oren Beckmann.D.  On: 02/04/2015 04:51    Anti-infectives: Anti-infectives    Start     Dose/Rate Route Frequency Ordered Stop   02/04/15 0500  piperacillin-tazobactam (ZOSYN) IVPB 3.375 g     3.375 g 12.5 mL/hr over 240 Minutes  Intravenous  Once 02/04/15 0450 02/04/15 1012      Assessment/Plan:  64 year old female admitted for bowel obstruction secondary to parastomal hernia. Appears to be improving with NG tube decompression and bowel rest. Again discussed the plan for eventual surgical revision of ostomy versus ostia reversal and hernia repair. Patient understands the risks involved with this and the need for awaiting resolution of her bowel obstruction prior. We'll continue to follow closely.  Compton Brigance T. Adonis Huguenin, MD, FACS  02/05/2015

## 2015-02-05 NOTE — Progress Notes (Signed)
[facility] Encounter Note  Patient: [name] / Admit Date: 02/04/2015 / Date of Encounter: 02/05/2015, 10:35 AM   Subjective: Hemodynamically stable today with some abdominal tenderness but no evidence of heart failure and/or anginal symptoms  Review of Systems: Positive for: Abdominal tenderness Negative for: Vision change, hearing change, syncope, dizziness, nausea, vomiting,diarrhea, bloody stool,  Others previously listed  Objective: Telemetry: Normal sinus rhythm Physical Exam: Blood pressure 151/74, pulse 74, temperature 98.8 F (37.1 C), temperature source Oral, resp. rate 17, height 5\' 3"  (1.6 m), weight 222 lb 11.2 oz (101.016 kg), SpO2 92 %. Body mass index is 39.46 kg/(m^2). General: Well developed, well nourished, in no acute distress. Head: Normocephalic, atraumatic, sclera non-icteric, no xanthomas, nares are without discharge. Neck: No apparent masses Lungs: Normal respirations with no wheezes, no rhonchi, no rales , no crackles   Heart: Regular rate and rhythm, normal S1 soft S2, 3-4+ aortic murmur, no rub, no gallop, PMI is normal size and placement, carotid upstroke normal with bruit, jugular venous pressure normal Abdomen: Soft, non-tender, non-distended with normoactive bowel sounds. No hepatosplenomegaly. Abdominal aorta is normal size without bruit Extremities: No edema, no clubbing, no cyanosis, no ulcers,  Peripheral: 2+ radial, 2+ femoral, 2+ dorsal pedal pulses Neuro: Alert and oriented. Moves all extremities spontaneously. Psych:  Responds to questions appropriately with a normal affect.   Intake/Output Summary (Last 24 hours) at 02/05/15 1035 Last data filed at 02/05/15 0643  Gross per 24 hour  Intake 484.27 ml  Output   1410 ml  Net -925.73 ml    Inpatient Medications:  . enoxaparin (LOVENOX) injection  40 mg Subcutaneous Q24H  . insulin aspart  0-5 Units Subcutaneous QHS  . insulin aspart  0-9 Units Subcutaneous TID  WC  . irbesartan  37.5 mg Oral Daily  . metoprolol succinate  25 mg Oral Daily  . pantoprazole (PROTONIX) IV  40 mg Intravenous Q24H   Infusions:    Labs:  Recent Labs  02/04/15 0203 02/05/15 0637  NA 138 138  K 5.1 4.6  CL 107 106  CO2 22 26  GLUCOSE 250* 184*  BUN 29* 27*  CREATININE 1.90* 2.26*  CALCIUM 8.3* 8.1*  MG  --  2.1  PHOS  --  4.7*    Recent Labs  02/04/15 0203  AST 20  ALT 13*  ALKPHOS 97  BILITOT 0.9  PROT 7.4  ALBUMIN 3.7    Recent Labs  02/04/15 0203 02/05/15 0637  WBC 12.0* 8.8  HGB 13.0 12.6  HCT 41.8 40.3  MCV 83.6 84.4  PLT 238 237   No results for input(s): CKTOTAL, CKMB, TROPONINI in the last 72 hours. Invalid input(s): POCBNP  Recent Labs  02/04/15 0203  HGBA1C 9.2*     Weights: Filed Weights   02/04/15 0200 02/05/15 0500  Weight: 215 lb (97.523 kg) 222 lb 11.2 oz (101.016 kg)     Radiology/Studies:  Ct Abdomen Pelvis W Contrast  02/04/2015  CLINICAL DATA:  Severe nausea and abdominal pain. History of diverticulitis, GI bleed, bowel obstruction, colectomy, cecal perforation, and hysterectomy. Technically issues with the scanner necessitated moving the patient to a different scanner for delayed imaging peer EXAM: CT ABDOMEN AND PELVIS WITH CONTRAST TECHNIQUE: Multidetector CT imaging of the abdomen and pelvis was performed using the standard protocol following bolus administration of intravenous contrast. CONTRAST:  51mL OMNIPAQUE IOHEXOL 300 MG/ML  SOLN COMPARISON:  07/09/2013 FINDINGS: The lung bases are clear.  Coronary artery calcification. Surgical absence  of the gallbladder. No bile duct dilatation. The liver, spleen, pancreas, adrenal glands, kidneys, abdominal aorta, and inferior vena cava are unremarkable. 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 There is mild proximal dilatation of small bowel up to the level of the hernia with decompressed distal small  bowel. Contrast material only flows through to the hernia. There is fluid in the hernia with mesenteric stranding suggesting possible incarceration. Can't exclude vascular insult. Colon is decompressed outside of the hernia. Diverticulosis of the colon. No free air or free fluid in the abdomen. Pelvis: No pelvic mass or lymphadenopathy. Bladder wall is not thickened. Appendix is normal. Degenerative changes in the spine and hips. Slight anterior subluxation of L4 on L5. No destructive bone lesions. IMPRESSION: There is a large left lower quadrant peristomal hernia containing small bowel. Small bowel proximal to the hernia appear mildly dilated. Small bowel loops within the hernia are dilated. Distal small bowel is decompressed. There is infiltration and edema in the mesentery within the hernia. Findings suggest incarceration with small bowel obstruction and possible vascular insult. These results were called by telephone at the time of interpretation on 02/04/2015 at 4:47 am to Dr. Hinda Kehr , who verbally acknowledged these results. Electronically Signed   By: Lucienne Capers M.D.   On: 02/04/2015 04:51     Assessment and Recommendation  64 y.o. female with moderate aortic valve stenosis essential hypertension diabetes with complication having abdominal discomfort due to bowel obstruction and hernia without evidence of congestive heart failure or angina at this time at lowest risk possible for cardiac complication with surgical intervention 1. No further cardiac diagnostics necessary at this time due to recent stress test and echocardiogram showing stable aortic valve stenosis 2. Proceed to surgery without restrictions due to stability of above 3. No change in hydralazine and metoprolol as well as angiotensin receptor blocker for hypertension control 4. Again ambulation thereafter  Signed, Serafina Royals M.D. FACC

## 2015-02-05 NOTE — Progress Notes (Signed)
[facility] NAME: [name]    MR#:  [id number]  DATE OF BIRTH:  [birth year]  SUBJECTIVE:  CHIEF COMPLAINT:   Chief Complaint  Patient presents with  . Abdominal Pain   Feeling slightly better. Still no ostomy output. NG tube collecting thick green secretions. No pain  REVIEW OF SYSTEMS:   Review of Systems  Constitutional: Negative for fever.  Respiratory: Negative for shortness of breath.   Cardiovascular: Negative for chest pain and palpitations.  Gastrointestinal: Positive for nausea. Negative for vomiting and abdominal pain.  Genitourinary: Negative for dysuria.    DRUG ALLERGIES:  No Known Allergies  VITALS:  Blood pressure 145/73, pulse 75, temperature 98.3 F (36.8 C), temperature source Oral, resp. rate 18, height 5\' 3"  (1.6 m), weight 101.016 kg (222 lb 11.2 oz), SpO2 95 %.  PHYSICAL EXAMINATION:  GENERAL:  64 y.o.-year-old patient lying in the bed with no acute distress. Obese. NG tube. LUNGS: Normal breath sounds bilaterally, no wheezing, rales,rhonchi or crepitation. No use of accessory muscles of respiration.  CARDIOVASCULAR: S1, S2 normal. No murmurs, rubs, or gallops.  ABDOMEN: Soft, nontender, nondistended. Bowel sounds decreased. No guarding. No rebound. Ostomy in left lower quadrant with no excretions. Bag is empty. Swelling reduced around the ostomy. EXTREMITIES: No pedal edema, cyanosis, or clubbing.  NEUROLOGIC: Cranial nerves II through XII are intact. Muscle strength 5/5 in all extremities. Sensation intact. Gait not checked.  PSYCHIATRIC: The patient is alert and oriented x 3.  SKIN: No obvious rash, lesion, or ulcer.    LABORATORY PANEL:   CBC  Recent Labs Lab 02/05/15 0637  WBC 8.8  HGB 12.6  HCT 40.3  PLT 237   ------------------------------------------------------------------------------------------------------------------  Chemistries   Recent Labs Lab 02/04/15 0203  02/05/15 0637  NA 138 138  K 5.1 4.6  CL 107 106  CO2 22 26  GLUCOSE 250* 184*  BUN 29* 27*  CREATININE 1.90* 2.26*  CALCIUM 8.3* 8.1*  MG  --  2.1  AST 20  --   ALT 13*  --   ALKPHOS 97  --   BILITOT 0.9  --    ------------------------------------------------------------------------------------------------------------------  Cardiac Enzymes No results for input(s): TROPONINI in the last 168 hours. ------------------------------------------------------------------------------------------------------------------  RADIOLOGY:  Ct Abdomen Pelvis W Contrast  02/04/2015  CLINICAL DATA:  Severe nausea and abdominal pain. History of diverticulitis, GI bleed, bowel obstruction, colectomy, cecal perforation, and hysterectomy. Technically issues with the scanner necessitated moving the patient to a different scanner for delayed imaging peer EXAM: CT ABDOMEN AND PELVIS WITH CONTRAST TECHNIQUE: Multidetector CT imaging of the abdomen and pelvis was performed using the standard protocol following bolus administration of intravenous contrast. CONTRAST:  79mL OMNIPAQUE IOHEXOL 300 MG/ML  SOLN COMPARISON:  07/09/2013 FINDINGS: The lung bases are clear.  Coronary artery calcification. Surgical absence of the gallbladder. No bile duct dilatation. The liver, spleen, pancreas, adrenal glands, kidneys, abdominal aorta, and inferior vena cava are unremarkable. 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 There is mild proximal dilatation of small bowel up to the level of the hernia with decompressed distal small bowel. Contrast material only flows through to the hernia. There is fluid in the hernia with mesenteric stranding suggesting possible incarceration. Can't exclude vascular insult. Colon is decompressed outside of the hernia. Diverticulosis of the colon. No free air or free fluid in the abdomen. Pelvis: No pelvic mass or lymphadenopathy. Bladder  wall is not thickened. Appendix is normal. Degenerative changes in the spine and hips. Slight anterior subluxation of L4 on L5. No destructive bone lesions. IMPRESSION: There is a large left lower quadrant peristomal hernia containing small bowel. Small bowel proximal to the hernia appear mildly dilated. Small bowel loops within the hernia are dilated. Distal small bowel is decompressed. There is infiltration and edema in the mesentery within the hernia. Findings suggest incarceration with small bowel obstruction and possible vascular insult. These results were called by telephone at the time of interpretation on 02/04/2015 at 4:47 am to Dr. Hinda Kehr , who verbally acknowledged these results. Electronically Signed   By: Lucienne Capers M.D.   On: 02/04/2015 04:51    EKG:   Orders placed or performed during the hospital encounter of 02/04/15  . ED EKG  . ED EKG  . EKG 12-Lead  . EKG 12-Lead    ASSESSMENT AND PLAN:    #1 small bowel obstruction, possible incarceration of small bowel in the periostomal hernia: - Plan per surgery, hopefully can continue with conservative management  #2 diabetes mellitus type 2: - hemoglobin A1c is 9.2, poor control in outpatient setting - She is currently nothing by mouth due to SBO, receiving D5 - Continue sliding scale  #3 hypertension: - Blood pressure elevated. Continue metoprolol. We'll start standing hydralazine. Will hold Avapro   #4 hyperkalemia -Resolved  #5 chronic kidney disease stage III - Renal function worse today. Continue to monitor. Electrolytes are stable. If creatinine continues to rise would consult nephrology - Continue to hold nephrotoxins including Avapro for now  #6 hypothyroidism -  TSH slightly elevated at 4.5, continue Synthroid  #7 hyperlipidemia - Hold statin for now  #8 preop clearance: She has been seen by cardiology and from cardiac standpoint is cleared for surgery. No other acute medical concerns at this  time.  All the records are reviewed and case discussed with Care Management/Social Workerr. Management plans discussed with the patient, family and they are in agreement.  CODE STATUS: Full   TOTAL TIME TAKING CARE OF THIS PATIENT: 25 minutes.  Greater than 50% of time spent in care coordination and counseling. POSSIBLE D/C IN 1-2 DAYS, DEPENDING ON CLINICAL CONDITION.   Myrtis Ser M.D on 02/05/2015 at 3:21 PM  Between 7am to 6pm - Pager - [phone]  After 6pm go to [email or web address] - password [email or web address] [facility]  [facility]  Office  [phone]  CC: Primary care physician; Adin Hector, MD

## 2015-02-06 ENCOUNTER — Inpatient Hospital Stay: Payer: PPO | Admitting: Anesthesiology

## 2015-02-06 ENCOUNTER — Encounter
Admission: EM | Disposition: A | Discharge status: Skilled Nursing Facility | Payer: Self-pay | Source: Home / Self Care | Attending: Surgery

## 2015-02-06 ENCOUNTER — Inpatient Hospital Stay: Payer: PPO

## 2015-02-06 ENCOUNTER — Encounter: Payer: Self-pay | Admitting: Anesthesiology

## 2015-02-06 ENCOUNTER — Encounter: Payer: Self-pay | Admitting: *Deleted

## 2015-02-06 HISTORY — PX: LAPAROTOMY: SHX154

## 2015-02-06 LAB — BASIC METABOLIC PANEL
Anion gap: 7 (ref 5–15)
BUN: 24 mg/dL — ABNORMAL HIGH (ref 6–20)
CALCIUM: 8.2 mg/dL — AB (ref 8.9–10.3)
CHLORIDE: 108 mmol/L (ref 101–111)
CO2: 24 mmol/L (ref 22–32)
CREATININE: 2.17 mg/dL — AB (ref 0.44–1.00)
GFR, EST AFRICAN AMERICAN: 26 mL/min — AB (ref >60)
GFR, EST NON AFRICAN AMERICAN: 23 mL/min — AB (ref >60)
Glucose, Bld: 155 mg/dL — ABNORMAL HIGH (ref 65–99)
Potassium: 4.7 mmol/L (ref 3.5–5.1)
SODIUM: 139 mmol/L (ref 135–145)

## 2015-02-06 LAB — CBC
HCT: 38.3 % (ref 35.0–47.0)
HEMOGLOBIN: 11.9 g/dL — AB (ref 12.0–16.0)
MCH: 26.2 pg (ref 26.0–34.0)
MCHC: 31.2 g/dL — AB (ref 32.0–36.0)
MCV: 84 fL (ref 80.0–100.0)
Platelets: 222 10*3/uL (ref 150–440)
RBC: 4.56 MIL/uL (ref 3.80–5.20)
RDW: 14.7 % — ABNORMAL HIGH (ref 11.5–14.5)
WBC: 8 10*3/uL (ref 3.6–11.0)

## 2015-02-06 LAB — GLUCOSE, CAPILLARY
GLUCOSE-CAPILLARY: 131 mg/dL — AB (ref 65–99)
GLUCOSE-CAPILLARY: 131 mg/dL — AB (ref 65–99)
GLUCOSE-CAPILLARY: 188 mg/dL — AB (ref 65–99)
GLUCOSE-CAPILLARY: 153 mg/dL — AB (ref 65–99)
Glucose-Capillary: 125 mg/dL — ABNORMAL HIGH (ref 65–99)

## 2015-02-06 SURGERY — LAPAROTOMY, EXPLORATORY
Anesthesia: General | Wound class: Clean Contaminated

## 2015-02-06 SURGERY — LAPAROTOMY, EXPLORATORY
Anesthesia: Choice

## 2015-02-06 MED ORDER — FENTANYL CITRATE (PF) 100 MCG/2ML IJ SOLN
INTRAMUSCULAR | Status: AC
  Administered 2015-02-06: 25 ug via INTRAVENOUS
  Filled 2015-02-06: qty 2

## 2015-02-06 MED ORDER — FENTANYL CITRATE (PF) 100 MCG/2ML IJ SOLN
25.0000 ug | INTRAMUSCULAR | Status: AC | PRN
  Administered 2015-02-06 (×6): 25 ug via INTRAVENOUS

## 2015-02-06 MED ORDER — ROCURONIUM BROMIDE 100 MG/10ML IV SOLN
INTRAVENOUS | Status: DC | PRN
  Administered 2015-02-06: 10 mg via INTRAVENOUS
  Administered 2015-02-06: 25 mg via INTRAVENOUS
  Administered 2015-02-06 (×2): 10 mg via INTRAVENOUS

## 2015-02-06 MED ORDER — MORPHINE SULFATE 2 MG/ML IV SOLN
INTRAVENOUS | Status: DC
  Administered 2015-02-06: 18:00:00 via INTRAVENOUS
  Administered 2015-02-07: 10.5 mg via INTRAVENOUS
  Administered 2015-02-07: 3 mg via INTRAVENOUS
  Administered 2015-02-07: 21:00:00 via INTRAVENOUS
  Administered 2015-02-07: 6 mg via INTRAVENOUS
  Administered 2015-02-08: 4.5 mg via INTRAVENOUS
  Administered 2015-02-08: 3 mg via INTRAVENOUS
  Filled 2015-02-06 (×3): qty 25

## 2015-02-06 MED ORDER — LIDOCAINE HCL (CARDIAC) 20 MG/ML IV SOLN
INTRAVENOUS | Status: DC | PRN
  Administered 2015-02-06: 100 mg via INTRAVENOUS

## 2015-02-06 MED ORDER — FENTANYL CITRATE (PF) 100 MCG/2ML IJ SOLN
INTRAMUSCULAR | Status: DC | PRN
  Administered 2015-02-06: 150 ug via INTRAVENOUS
  Administered 2015-02-06 (×2): 50 ug via INTRAVENOUS

## 2015-02-06 MED ORDER — SODIUM CHLORIDE 0.9 % IJ SOLN
INTRAMUSCULAR | Status: AC
  Filled 2015-02-06: qty 3

## 2015-02-06 MED ORDER — ONDANSETRON HCL 4 MG/2ML IJ SOLN
INTRAMUSCULAR | Status: DC | PRN
  Administered 2015-02-06: 4 mg via INTRAVENOUS

## 2015-02-06 MED ORDER — LACTATED RINGERS IV SOLN
INTRAVENOUS | Status: DC | PRN
  Administered 2015-02-06: 13:00:00 via INTRAVENOUS

## 2015-02-06 MED ORDER — DIPHENHYDRAMINE HCL 50 MG/ML IJ SOLN
12.5000 mg | Freq: Four times a day (QID) | INTRAMUSCULAR | Status: DC | PRN
  Filled 2015-02-06: qty 0.25

## 2015-02-06 MED ORDER — HYDROMORPHONE HCL 1 MG/ML IJ SOLN: 0.2500 mg | INTRAMUSCULAR | Status: DC | PRN

## 2015-02-06 MED ORDER — MIDAZOLAM HCL 2 MG/2ML IJ SOLN
INTRAMUSCULAR | Status: DC | PRN
  Administered 2015-02-06 (×2): 1 mg via INTRAVENOUS

## 2015-02-06 MED ORDER — NALOXONE HCL 0.4 MG/ML IJ SOLN: 0.4000 mg | INTRAMUSCULAR | Status: DC | PRN

## 2015-02-06 MED ORDER — DEXTROSE-NACL 5-0.45 % IV SOLN
INTRAVENOUS | Status: DC
  Administered 2015-02-06: 18:00:00 via INTRAVENOUS

## 2015-02-06 MED ORDER — HYDRALAZINE HCL 50 MG PO TABS
100.0000 mg | ORAL_TABLET | Freq: Three times a day (TID) | ORAL | Status: DC
  Administered 2015-02-07 (×2): 100 mg via ORAL
  Filled 2015-02-06 (×2): qty 2

## 2015-02-06 MED ORDER — SODIUM CHLORIDE 0.9 % IV SOLN
10000.0000 ug | INTRAVENOUS | Status: DC | PRN
  Administered 2015-02-06: 25 ug/min via INTRAVENOUS

## 2015-02-06 MED ORDER — DIPHENHYDRAMINE HCL 12.5 MG/5ML PO ELIX
12.5000 mg | ORAL_SOLUTION | Freq: Four times a day (QID) | ORAL | Status: DC | PRN
  Filled 2015-02-06: qty 5

## 2015-02-06 MED ORDER — SUCCINYLCHOLINE CHLORIDE 20 MG/ML IJ SOLN
INTRAMUSCULAR | Status: DC | PRN
  Administered 2015-02-06: 100 mg via INTRAVENOUS

## 2015-02-06 MED ORDER — ETOMIDATE 2 MG/ML IV SOLN
INTRAVENOUS | Status: DC | PRN
  Administered 2015-02-06: 14 mg via INTRAVENOUS

## 2015-02-06 MED ORDER — MEPERIDINE HCL 25 MG/ML IJ SOLN
15.0000 mg | Freq: Once | INTRAMUSCULAR | Status: AC
  Administered 2015-02-06: 15 mg via INTRAVENOUS
  Filled 2015-02-06: qty 1

## 2015-02-06 MED ORDER — NEOSTIGMINE METHYLSULFATE 10 MG/10ML IV SOLN
INTRAVENOUS | Status: DC | PRN
  Administered 2015-02-06: 4 mg via INTRAVENOUS

## 2015-02-06 MED ORDER — MORPHINE SULFATE (PF) 2 MG/ML IV SOLN
2.0000 mg | Freq: Once | INTRAVENOUS | Status: AC
  Administered 2015-02-06: 2 mg via INTRAVENOUS
  Filled 2015-02-06: qty 1

## 2015-02-06 MED ORDER — SODIUM CHLORIDE 0.9 % IV SOLN
INTRAVENOUS | Status: DC
  Administered 2015-02-06: 13:00:00 via INTRAVENOUS

## 2015-02-06 MED ORDER — ONDANSETRON HCL 4 MG/2ML IJ SOLN: 4.0000 mg | Freq: Four times a day (QID) | INTRAMUSCULAR | Status: DC | PRN

## 2015-02-06 MED ORDER — SODIUM CHLORIDE 0.9 % IJ SOLN: 9.0000 mL | INTRAMUSCULAR | Status: DC | PRN

## 2015-02-06 MED ORDER — SODIUM CHLORIDE 0.9 % IV SOLN
1.0000 g | Freq: Once | INTRAVENOUS | Status: AC
  Administered 2015-02-06: 1 g via INTRAVENOUS
  Filled 2015-02-06: qty 1

## 2015-02-06 MED ORDER — ACETAMINOPHEN 10 MG/ML IV SOLN
INTRAVENOUS | Status: DC | PRN
  Administered 2015-02-06: 1000 mg via INTRAVENOUS

## 2015-02-06 MED ORDER — GLYCOPYRROLATE 0.2 MG/ML IJ SOLN
INTRAMUSCULAR | Status: DC | PRN
  Administered 2015-02-06: 0.6 mg via INTRAVENOUS

## 2015-02-06 MED ORDER — PHENYLEPHRINE HCL 10 MG/ML IJ SOLN
INTRAMUSCULAR | Status: DC | PRN
  Administered 2015-02-06 (×3): 100 ug via INTRAVENOUS

## 2015-02-06 MED ORDER — ACETAMINOPHEN 10 MG/ML IV SOLN
INTRAVENOUS | Status: AC
  Filled 2015-02-06: qty 100

## 2015-02-06 SURGICAL SUPPLY — 84 items
BARRIER SKIN 2 1/4 (WOUND CARE) ×2 IMPLANT
BARRIER SKIN 2 1/4INCH (WOUND CARE) ×1
BLADE SURG SZ10 CARB STEEL (BLADE) IMPLANT
BULB RESERV EVAC DRAIN JP 100C (MISCELLANEOUS) ×3 IMPLANT
CANISTER SUCT 1200ML W/VALVE (MISCELLANEOUS) ×3 IMPLANT
CANISTER SUCT 3000ML (MISCELLANEOUS) ×3 IMPLANT
CANNULA DILATOR 12 W/SLV (CANNULA) IMPLANT
CANNULA DILATOR 12MM W/SLV (CANNULA)
CATH TRAY 16F METER LATEX (MISCELLANEOUS) ×3 IMPLANT
CHLORAPREP W/TINT 26ML (MISCELLANEOUS) IMPLANT
CLEANER CAUTERY TIP 5X5 PAD (MISCELLANEOUS) IMPLANT
CLIP TI LARGE 6 (CLIP) IMPLANT
CLIP TI MEDIUM 6 (CLIP) IMPLANT
CLOSURE WOUND 1/2 X4 (GAUZE/BANDAGES/DRESSINGS)
DEFOGGER SCOPE WARMER CLEARIFY (MISCELLANEOUS) IMPLANT
DEVICE SECURE STRAP 25 ABSORB (INSTRUMENTS) ×3 IMPLANT
DRAIN CHANNEL JP 19F (MISCELLANEOUS) ×3 IMPLANT
DRAPE INCISE IOBAN 66X45 STRL (DRAPES) ×3 IMPLANT
DRAPE LAPAROTOMY 100X77 ABD (DRAPES) ×3 IMPLANT
DRAPE SHEET LG 3/4 BI-LAMINATE (DRAPES) IMPLANT
DRAPE UTILITY 15X26 TOWEL STRL (DRAPES) ×6 IMPLANT
DRSG OPSITE POSTOP 4X10 (GAUZE/BANDAGES/DRESSINGS) IMPLANT
DRSG OPSITE POSTOP 4X12 (GAUZE/BANDAGES/DRESSINGS) ×3 IMPLANT
DRSG OPSITE POSTOP 4X8 (GAUZE/BANDAGES/DRESSINGS) IMPLANT
ELECT BLADE 6.5 EXT (BLADE) ×3 IMPLANT
ELECT CAUTERY BLADE 6.4 (BLADE) ×3 IMPLANT
GAUZE SPONGE 4X4 12PLY STRL (GAUZE/BANDAGES/DRESSINGS) ×6 IMPLANT
GLOVE BIO SURGEON STRL SZ7.5 (GLOVE) ×21 IMPLANT
GOWN STRL REUS W/ TWL LRG LVL3 (GOWN DISPOSABLE) ×2 IMPLANT
GOWN STRL REUS W/ TWL XL LVL3 (GOWN DISPOSABLE) ×2 IMPLANT
GOWN STRL REUS W/TWL LRG LVL3 (GOWN DISPOSABLE) ×4
GOWN STRL REUS W/TWL XL LVL3 (GOWN DISPOSABLE) ×4
GRADUATE 1200CC STRL 31836 (MISCELLANEOUS) ×3 IMPLANT
IRRIGATION STRYKERFLOW (MISCELLANEOUS) IMPLANT
IRRIGATOR STRYKERFLOW (MISCELLANEOUS)
IV NS 1000ML (IV SOLUTION) ×2
IV NS 1000ML BAXH (IV SOLUTION) ×1 IMPLANT
KIT RM TURNOVER STRD PROC AR (KITS) ×3 IMPLANT
LABEL OR SOLS (LABEL) IMPLANT
LIGASURE IMPACT 36 18CM CVD LR (INSTRUMENTS) ×3 IMPLANT
LIGASURE MARYLAND LAP STAND (ELECTROSURGICAL) IMPLANT
MESH BIO-A  9X15 SYN MAT (Mesh General) ×4 IMPLANT
MESH BIO-A 9X15 SYN MAT (Mesh General) ×2 IMPLANT
NS IRRIG 1000ML POUR BTL (IV SOLUTION) ×6 IMPLANT
NS IRRIG 500ML POUR BTL (IV SOLUTION) IMPLANT
PACK BASIN MAJOR ARMC (MISCELLANEOUS) ×3 IMPLANT
PACK COLON CLEAN CLOSURE (MISCELLANEOUS) ×3 IMPLANT
PACK LAP CHOLECYSTECTOMY (MISCELLANEOUS) IMPLANT
PAD ABD DERMACEA PRESS 5X9 (GAUZE/BANDAGES/DRESSINGS) IMPLANT
PAD CLEANER CAUTERY TIP 5X5 (MISCELLANEOUS)
PAD GROUND ADULT SPLIT (MISCELLANEOUS) ×3 IMPLANT
PENCIL ELECTRO HAND CTR (MISCELLANEOUS) IMPLANT
POUCH DRAIN  2 1/4 MED RED 181 (OSTOMY) ×3 IMPLANT
POUCH ENDO CATCH 10MM SPEC (MISCELLANEOUS) IMPLANT
SCISSORS METZENBAUM CVD 33 (INSTRUMENTS) IMPLANT
SHEARS HARMONIC ACE PLUS 36CM (ENDOMECHANICALS) IMPLANT
SOL PREP PVP 2OZ (MISCELLANEOUS) ×3
SOLUTION PREP PVP 2OZ (MISCELLANEOUS) ×1 IMPLANT
SPONGE LAP 18X18 5 PK (GAUZE/BANDAGES/DRESSINGS) ×3 IMPLANT
STAPLER AUT SUT LDS 15W (STAPLE) IMPLANT
STAPLER SKIN PROX 35W (STAPLE) ×6 IMPLANT
STRIP CLOSURE SKIN 1/2X4 (GAUZE/BANDAGES/DRESSINGS) IMPLANT
SUT CHROMIC 0 CT 1 (SUTURE) IMPLANT
SUT CHROMIC 3 0 SH 27 (SUTURE) IMPLANT
SUT ETHILON 3-0 FS-10 30 BLK (SUTURE) ×3
SUT ETHILON 4-0 (SUTURE)
SUT ETHILON 4-0 FS2 18XMFL BLK (SUTURE)
SUT MAXON ABS #0 GS21 30IN (SUTURE) IMPLANT
SUT NYLON 2-0 (SUTURE) IMPLANT
SUT PDS AB 1 TP1 54 (SUTURE) ×3 IMPLANT
SUT PDS AB 1 TP1 96 (SUTURE) ×6 IMPLANT
SUT SILK 3 0 REEL (SUTURE) IMPLANT
SUT SILK 3-0 (SUTURE) ×3 IMPLANT
SUT TICRON 2-0 30IN 311381 (SUTURE) IMPLANT
SUT VIC AB 1 CTX 27 (SUTURE) ×3 IMPLANT
SUT VIC AB 2-0 SH 27 (SUTURE) ×4
SUT VIC AB 2-0 SH 27XBRD (SUTURE) ×2 IMPLANT
SUT VICRYL 0 TIES 12 18 (SUTURE) ×3 IMPLANT
SUT VICRYL PLUS ABS 0 54 (SUTURE) IMPLANT
SUTURE EHLN 3-0 FS-10 30 BLK (SUTURE) ×1 IMPLANT
SUTURE ETHLN 4-0 FS2 18XMF BLK (SUTURE) IMPLANT
TROCAR XCEL NON-BLD 11X100MML (ENDOMECHANICALS) IMPLANT
TROCAR XCEL UNIV SLVE 11M 100M (ENDOMECHANICALS) IMPLANT
TUBING INSUFFLATOR HEATED (MISCELLANEOUS) IMPLANT

## 2015-02-06 SURGICAL SUPPLY — 27 items
CANISTER SUCT 1200ML W/VALVE (MISCELLANEOUS) IMPLANT
CANISTER SUCT 3000ML (MISCELLANEOUS) IMPLANT
CATH TRAY 16F METER LATEX (MISCELLANEOUS) IMPLANT
CHLORAPREP W/TINT 26ML (MISCELLANEOUS) IMPLANT
DRAPE LAPAROTOMY 100X77 ABD (DRAPES) IMPLANT
DRAPE SHEET LG 3/4 BI-LAMINATE (DRAPES) IMPLANT
DRAPE UTILITY 15X26 TOWEL STRL (DRAPES) IMPLANT
ELECT CAUTERY BLADE 6.4 (BLADE) IMPLANT
GAUZE SPONGE 4X4 12PLY STRL (GAUZE/BANDAGES/DRESSINGS) IMPLANT
GLOVE BIO SURGEON STRL SZ7.5 (GLOVE) IMPLANT
GOWN STRL REUS W/ TWL LRG LVL3 (GOWN DISPOSABLE) IMPLANT
GOWN STRL REUS W/ TWL XL LVL3 (GOWN DISPOSABLE) IMPLANT
GOWN STRL REUS W/TWL LRG LVL3 (GOWN DISPOSABLE)
GOWN STRL REUS W/TWL XL LVL3 (GOWN DISPOSABLE)
KIT RM TURNOVER STRD PROC AR (KITS) IMPLANT
LABEL OR SOLS (LABEL) IMPLANT
LIGASURE MARYLAND LAP STAND (ELECTROSURGICAL) IMPLANT
NS IRRIG 1000ML POUR BTL (IV SOLUTION) IMPLANT
PACK BASIN MAJOR ARMC (MISCELLANEOUS) IMPLANT
PAD ABD DERMACEA PRESS 5X9 (GAUZE/BANDAGES/DRESSINGS) IMPLANT
PAD GROUND ADULT SPLIT (MISCELLANEOUS) IMPLANT
STAPLER SKIN PROX 35W (STAPLE) IMPLANT
SUT CHROMIC 0 CT 1 (SUTURE) IMPLANT
SUT CHROMIC 3 0 SH 27 (SUTURE) IMPLANT
SUT TICRON 2-0 30IN 311381 (SUTURE) IMPLANT
SUT VIC AB 1 CTX 27 (SUTURE) IMPLANT
SUT VICRYL PLUS ABS 0 54 (SUTURE) IMPLANT

## 2015-02-06 NOTE — Progress Notes (Signed)
Inpatient Diabetes Program Recommendations  AACE/ADA: New Consensus Statement on Inpatient Glycemic Control (2015)  Target Ranges:  Prepandial:   less than 140 mg/dL      Peak postprandial:   less than 180 mg/dL (1-2 hours)      Critically ill patients:  140 - 180 mg/dL   Review of Glycemic Control Results for [name], [name] (MRN [id number]) as of 02/06/2015 10:48  Ref. Range 02/05/2015 07:34 02/05/2015 11:46 02/05/2015 16:35 02/05/2015 21:59 02/06/2015 07:36  Glucose-Capillary Latest Ref Range: 65-99 mg/dL 146 (H) 165 (H) 169 (H) 165 (H) 131 (H)  Results for [name], [name] (MRN [id number]) as of 02/06/2015 10:48  Ref. Range 02/04/2015 02:03  Hemoglobin A1C Latest Ref Range: 4.0-6.0 % 9.2 (H)   Diabetes history: Diabetes mellitus Outpatient Diabetes medications: Amaryl 4 mg daily Current orders for Inpatient glycemic control:  Novolog sensitive tid with meals and HS  Inpatient Diabetes Program Recommendations:    Consider increasing Novolog correction frequency to q 4 hours while patient is NPO.  A1C indicates need for improved outpatient control of diabetes.    Thanks, Adah Perl, RN, BC-ADM Inpatient Diabetes Coordinator Pager [phone] (8a-5p)

## 2015-02-06 NOTE — Care Management Important Message (Signed)
Important Message  Patient Details  Name: [name] MRN: [id number] Date of Birth: [birth year]   Medicare Important Message Given:  Yes    [name] A [name] 02/06/2015, 1:29 PM

## 2015-02-06 NOTE — Op Note (Signed)
02/04/2015 - 02/06/2015  4:31 PM  PATIENT:  [name]  64 y.o. female  PRE-OPERATIVE DIAGNOSIS:  SMALL BOWEL OBSTRUCTION, incarcerated parastomal hernia  POST-OPERATIVE DIAGNOSIS:  SMALL BOWEL OBSTRUCTION. Incarcerated parastomal hernia  PROCEDURE:  Procedure(s): Laparotomy, reduction of incarcerated parastomal hernia, repair of parastomal hernia with mesh (N/A) Bio-A patch 9 by 12 cm.  SURGEON:  Surgeon(s) and Role:    * Sherri Rad, MD - Primary  ASSISTANTS: Tech  ANESTHESIA: Gen. endotracheal     SPECIMEN: None    EBL: 175 cc  Description of procedure:  With the patient in the supine position general endotracheal anesthesia was induced. A Foley catheter was placed. The existing ostomy appliance was removed. The abdomen was sterilely prepped and draped with both Betadine followed by alcohol and Ioban draping. Timeout was observed.  A curvilinear incision was fashioned in the left upper quadrant well away from the ostomy site. Skin was divided with scalpel. I immediately encountered a large hernia sac. Circumferential dissection demonstrated the sac to contain incarcerated loops of small bowel. They were tensely distended. Attempts at reduction demonstrated multiple adhesions underlying the fascia. After proximal be 45 minutes of dissection of this area I elected to perform a midline incision to further assess the herniation.  The upper portion above the umbilicus of the previous midline's incision was opened sharply with scalpel. The fascia was identified and opened with scalpel. The peritoneum was opened sharply. Circumferential dissection along the undersurface of the abdominal wall with adhesional lysis liberated the left lower quadrant colostomy contents. Small bowel was then reduced by a combination of traction and pressure from the left upper quadrant incision bowel appeared to be intact with no signs of rupture or ischemia. There was no signs of a serosal injury.  At this  point the ostial defect was approximately 4-5 cm across. Hernia defect was skewed towards the midline however the hernia sac was directed more laterally. 2 sutures of #1 PDS was placed in interrupted fashion medially. Digitalization of the ostomy demonstrated no evidence of encroachment.  A piece of Gore Biio-A mesh (9 cm by 12 cm) was brought onto the field and was then cut in a keyhole fashion to accommodate wrapping of the mesh around the ostomy. With this accomplished the 2 tails crisscrossed each other and were tacked to the abdominal wall utilizing a Covidian secure strap device.  Lap and needle count was correct 2 and the abdominal midline fascia was then closed utilizing running looped #1 PDS from the extremes of the wound. Skin edges were closed with skin stapler.  The left upper quadrant incision was then reapproximated over a 19 mm Blake drain exiting the left upper quadrant. Drain was site was secured with nylon suture. The dermis was reapproximated with a running #2-0 Vicryl suture. Skin edges were used to close the skin. Sterile dressings were applied.  An ostomy appliance was placed. The patient was then subsequently extubated and taken to the recovery room in stable and satisfactory condition by anesthesia services.   Hortencia Conradi, MD, FACS

## 2015-02-06 NOTE — Transfer of Care (Signed)
Immediate Anesthesia Transfer of Care Note  Patient: [name]  Procedure(s) Performed: Procedure(s): Laparotomy, reduction of incarcerated parastomal hernia, repair of parastomal hernia with mesh (N/A)  Patient Location: PACU  Anesthesia Type:General  Level of Consciousness: awake  Airway & Oxygen Therapy: Patient Spontanous Breathing and Patient connected to nasal cannula oxygen  Post-op Assessment: Report given to RN and Post -op Vital signs reviewed and stable  Post vital signs: Reviewed and stable  Last Vitals:  Filed Vitals:   02/06/15 0942 02/06/15 1237  BP: 163/85 160/89  Pulse: 80 76  Temp:  35.9 C  Resp:  16    Complications: No apparent anesthesia complications

## 2015-02-06 NOTE — Plan of Care (Signed)
Problem: Safety: Goal: Ability to remain free from injury will improve Outcome: Progressing Pt bed alarm is active.  Marland Kitchen    e,.   Problem: Pain Managment: Goal: General experience of comfort will improve Outcome: Not Met (add Reason) Pt receives Diluadid q2 hours.

## 2015-02-06 NOTE — Progress Notes (Signed)
[facility]   PATIENT NAME: [name]    MR#:  [id number]  DATE OF BIRTH:  1950-11-11  SUBJECTIVE:  She is continuing to have pain in LLQ, minimal ostomy output. Last significant output from ostomy was Friday am.    REVIEW OF SYSTEMS:   Review of Systems  Constitutional: Negative for fever and chills.  Eyes: Negative.   Respiratory: Negative for cough.   Cardiovascular: Negative for chest pain.  Gastrointestinal: Positive for abdominal pain and constipation. Negative for heartburn, nausea, vomiting, diarrhea and blood in stool.  Skin: Negative for rash.  Neurological: Negative for headaches.    DRUG ALLERGIES:  No Known Allergies  VITALS:  Blood pressure 171/81, pulse 79, temperature 98.4 F (36.9 C), temperature source Oral, resp. rate 18, height 5\' 3"  (1.6 m), weight 225 lb 11.2 oz (102.377 kg), SpO2 98 %.  Filed Vitals:   02/05/15 1300 02/05/15 2106 02/06/15 0500 02/06/15 0525  BP: 145/73 154/77  171/81  Pulse: 75 72  79  Temp: 98.3 F (36.8 C) 98.5 F (36.9 C)  98.4 F (36.9 C)  TempSrc: Oral Oral  Oral  Resp: 18 18  18   Height:      Weight:   225 lb 11.2 oz (102.377 kg)   SpO2: 95% 97%  98%   I/O last 3 completed shifts: In: 2353.3 [I.V.:2323.3; NG/GT:30] Out: 2185 [Urine:1300; Emesis/NG output:885]   CBC Latest Ref Rng 02/06/2015 02/05/2015 02/04/2015  WBC 3.6 - 11.0 K/uL 8.0 8.8 12.0(H)  Hemoglobin 12.0 - 16.0 g/dL 11.9(L) 12.6 13.0  Hematocrit 35.0 - 47.0 % 38.3 40.3 41.8  Platelets 150 - 440 K/uL 222 237 238    BMP Latest Ref Rng 02/06/2015 02/05/2015 02/04/2015  Glucose 65 - 99 mg/dL 155(H) 184(H) 250(H)  BUN 6 - 20 mg/dL 24(H) 27(H) 29(H)  Creatinine 0.44 - 1.00 mg/dL 2.17(H) 2.26(H) 1.90(H)  Sodium 135 - 145 mmol/L 139 138 138  Potassium 3.5 - 5.1 mmol/L 4.7 4.6 5.1  Chloride 101 - 111 mmol/L 108 106 107  CO2 22 - 32 mmol/L 24 26 22   Calcium 8.9 - 10.3 mg/dL 8.2(L) 8.1(L) 8.3(L)    Abdominal series from this morning  demonstrates persistent small bowel dilatation with air-fluid levels and beads of pearls.  I see no gas within the colon.  PHYSICAL EXAMINATION:  Physical Exam  Constitutional: She is oriented to person, place, and time and well-developed, well-nourished, and in no distress. No distress.  HENT:  Head: Normocephalic and atraumatic.  Eyes: Pupils are equal, round, and reactive to light. No scleral icterus.  Cardiovascular: Normal rate and regular rhythm.   Pulmonary/Chest: Breath sounds normal. No respiratory distress.  Abdominal: Soft. Bowel sounds are normal. She exhibits mass. There is tenderness in the left upper quadrant. There is no rigidity, no rebound and no guarding. A hernia is present.    Neurological: She is oriented to person, place, and time.  Skin: Skin is warm and dry.  Psychiatric: Mood, memory, affect and judgment normal.     ASSESSMENT AND PLAN:    She has a small bowel obstruction secondary to an incarcerated parastomal hernia. She has morbid central truncal obesity. I discussed with her her options to include exploratory laparotomy and release of bowel obstruction with or without relocation of ileostomy versus ostomy reversal and diverting loop ileostomy. Her bowel unfortunately is unprepped putting her at increased risk of anastomotic problems. In addition for truncal obesity and body habitus makes construction of an ileostomy potentially challenging due to  the location and depth of abdominal wallI believe that she has failed nasogastric tube decompression. I have planned for surgical intervention today. All of her questions were answered a total of 20 minutes was spent with the patient discussing her options.

## 2015-02-06 NOTE — Anesthesia Postprocedure Evaluation (Signed)
Anesthesia Post Note  Patient: SUNITA FADEN  Procedure(s) Performed: Procedure(s) (LRB): Laparotomy, reduction of incarcerated parastomal hernia, repair of parastomal hernia with mesh (N/A)  Patient location during evaluation: PACU Anesthesia Type: General Level of consciousness: awake and alert Pain management: pain level controlled Vital Signs Assessment: post-procedure vital signs reviewed and stable Respiratory status: spontaneous breathing, nonlabored ventilation, respiratory function stable and patient connected to nasal cannula oxygen Cardiovascular status: blood pressure returned to baseline and stable Postop Assessment: no signs of nausea or vomiting Anesthetic complications: no    Last Vitals:  Filed Vitals:   02/06/15 1702 02/06/15 1751  BP: 143/87   Pulse: 80   Temp:    Resp: 15 16    Last Pain:  Filed Vitals:   02/06/15 1801  PainSc: 0-No pain                 Precious Haws Piscitello

## 2015-02-06 NOTE — Anesthesia Preprocedure Evaluation (Signed)
Anesthesia Evaluation  Patient identified by MRN, date of birth, ID band Patient awake    Reviewed: Allergy & Precautions, H&P , NPO status , Patient's Chart, lab work & pertinent test results  History of Anesthesia Complications Negative for: history of anesthetic complications  Airway Mallampati: III  TM Distance: <3 FB Neck ROM: limited    Dental  (+) Poor Dentition, Chipped   Pulmonary neg shortness of breath, former smoker,    Pulmonary exam normal breath sounds clear to auscultation       Cardiovascular Exercise Tolerance: Good hypertension, (-) angina(-) Past MI and (-) DOE Normal cardiovascular exam+ dysrhythmias Atrial Fibrillation + Valvular Problems/Murmurs AS  Rhythm:regular Rate:Normal     Neuro/Psych PSYCHIATRIC DISORDERS Anxiety negative neurological ROS     GI/Hepatic negative GI ROS, Neg liver ROS,   Endo/Other  diabetes, Type 2, Insulin DependentHyperthyroidism   Renal/GU negative Renal ROS  negative genitourinary   Musculoskeletal  (+) Arthritis ,   Abdominal   Peds  Hematology negative hematology ROS (+)   Anesthesia Other Findings Bowel Obstruction  Past Medical History:   Diabetes mellitus without complication (HCC)                 GI bleed                                                     Aortic valve stenosis                                        Hyperlipidemia                                               Hypertension                                                 Diverticulitis large intestine                               Obesity                                                      Shortness of breath dyspnea                                  Hyperthyroidism                                              Anxiety  Arthritis                                                    Neuropathy in diabetes ([place])                                  Anemia                                                       Vertigo                                                      Bowel obstruction [facility])                                     Past Surgical History:   COLECTOMY                                                     laparotomy closure of cecal perforation          05/09/2013     Comment:Dr. Marina Gravel   TONSILLECTOMY                                                 BACK SURGERY                                                    Comment:spur frmoved from lower back   CARDIAC CATHETERIZATION                                       ABDOMINAL HYSTERECTOMY                                       BMI    Body Mass Index   39.86 kg/m 2    Patient has cardiac clearance for this procedure.    Reproductive/Obstetrics negative OB ROS                             Anesthesia Physical Anesthesia Plan  ASA: IV  Anesthesia Plan: General ETT and Rapid Sequence   Post-op Pain Management:    Induction:   Airway Management Planned:   Additional Equipment:   Intra-op  Plan:   Post-operative Plan:   Informed Consent: I have reviewed the patients History and Physical, chart, labs and discussed the procedure including the risks, benefits and alternatives for the proposed anesthesia with the patient or authorized representative who has indicated his/her understanding and acceptance.   Dental Advisory Given  Plan Discussed with: Anesthesiologist, CRNA and Surgeon  Anesthesia Plan Comments: (Patient informed that they are higher risk for complications from anesthesia during this procedure due to their medical history.  Patient voiced understanding.)        Anesthesia Quick Evaluation

## 2015-02-06 NOTE — Anesthesia Procedure Notes (Signed)
Procedure Name: Intubation Date/Time: 02/06/2015 1:22 PM Performed by: [facility] [facility] Pre-anesthesia Checklist: Patient identified, Emergency Drugs available, Suction available and Patient being monitored Patient Re-evaluated:Patient Re-evaluated prior to inductionOxygen Delivery Method: Circle system utilized Preoxygenation: Pre-oxygenation with 100% oxygen Intubation Type: IV induction and Rapid sequence Laryngoscope Size: Glidescope (Lopro S3) Grade View: Grade I Tube type: Oral Number of attempts: 1 Airway Equipment and Method: Stylet and Patient positioned with wedge pillow Placement Confirmation: ETT inserted through vocal cords under direct vision,  positive ETCO2 and CO2 detector Secured at: 21 cm Tube secured with: Tape Dental Injury: Teeth and Oropharynx as per pre-operative assessment

## 2015-02-06 NOTE — Progress Notes (Signed)
[facility] Encounter Note  Patient: AMINTA COMI / Admit Date: 02/04/2015 / Date of Encounter: 02/06/2015, 9:40 AM   Subjective: Hemodynamically stable today with some abdominal tenderness but no evidence of heart failure and/or anginal symptoms still some ileus  Review of Systems: Positive for: Abdominal tenderness and ileus Negative for: Vision change, hearing change, syncope, dizziness, nausea, vomiting,diarrhea, bloody stool,  Others previously listed  Objective: Telemetry: Normal sinus rhythm Physical Exam: Blood pressure 171/81, pulse 79, temperature 98.4 F (36.9 C), temperature source Oral, resp. rate 18, height 5\' 3"  (1.6 m), weight 225 lb 11.2 oz (102.377 kg), SpO2 98 %. Body mass index is 39.99 kg/(m^2). General: Well developed, well nourished, in no acute distress. Head: Normocephalic, atraumatic, sclera non-icteric, no xanthomas, nares are without discharge. Neck: No apparent masses Lungs: Normal respirations with no wheezes, no rhonchi, no rales , no crackles   Heart: Regular rate and rhythm, normal S1 soft S2, 3-4+ aortic murmur, no rub, no gallop, PMI is normal size and placement, carotid upstroke normal with bruit, jugular venous pressure normal Abdomen: Soft, non-tender, non-distended with normoactive bowel sounds. No hepatosplenomegaly. Abdominal aorta is normal size without bruit Extremities: traca edema, no clubbing, no cyanosis, no ulcers,  Peripheral: 2+ radial, 2+ femoral, 2+ dorsal pedal pulses Neuro: Alert and oriented. Moves all extremities spontaneously. Psych:  Responds to questions appropriately with a normal affect.   Intake/Output Summary (Last 24 hours) at 02/06/15 0940 Last data filed at 02/06/15 0557  Gross per 24 hour  Intake 2205.72 ml  Output   1675 ml  Net 530.72 ml    Inpatient Medications:  . enoxaparin (LOVENOX) injection  30 mg Subcutaneous Q24H  . ertapenem  1 g Intravenous Once  . hydrALAZINE  100 mg Oral 3  times per day  . insulin aspart  0-5 Units Subcutaneous QHS  . insulin aspart  0-9 Units Subcutaneous TID WC  . metoprolol succinate  25 mg Oral Daily  . pantoprazole (PROTONIX) IV  40 mg Intravenous Q24H   Infusions:    Labs:  Recent Labs  02/05/15 0637 02/06/15 0650  NA 138 139  K 4.6 4.7  CL 106 108  CO2 26 24  GLUCOSE 184* 155*  BUN 27* 24*  CREATININE 2.26* 2.17*  CALCIUM 8.1* 8.2*  MG 2.1  --   PHOS 4.7*  --     Recent Labs  02/04/15 0203  AST 20  ALT 13*  ALKPHOS 97  BILITOT 0.9  PROT 7.4  ALBUMIN 3.7    Recent Labs  02/05/15 0637 02/06/15 0650  WBC 8.8 8.0  HGB 12.6 11.9*  HCT 40.3 38.3  MCV 84.4 84.0  PLT 237 222   No results for input(s): CKTOTAL, CKMB, TROPONINI in the last 72 hours. Invalid input(s): POCBNP  Recent Labs  02/04/15 0203  HGBA1C 9.2*     Weights: Filed Weights   02/04/15 0200 02/05/15 0500 02/06/15 0500  Weight: 215 lb (97.523 kg) 222 lb 11.2 oz (101.016 kg) 225 lb 11.2 oz (102.377 kg)     Radiology/Studies:  Ct Abdomen Pelvis W Contrast  02/04/2015  CLINICAL DATA:  Severe nausea and abdominal pain. History of diverticulitis, GI bleed, bowel obstruction, colectomy, cecal perforation, and hysterectomy. Technically issues with the scanner necessitated moving the patient to a different scanner for delayed imaging peer EXAM: CT ABDOMEN AND PELVIS WITH CONTRAST TECHNIQUE: Multidetector CT imaging of the abdomen and pelvis was performed using the standard protocol following bolus administration of intravenous contrast.  CONTRAST:  9mL OMNIPAQUE IOHEXOL 300 MG/ML  SOLN COMPARISON:  07/09/2013 FINDINGS: The lung bases are clear.  Coronary artery calcification. Surgical absence of the gallbladder. No bile duct dilatation. The liver, spleen, pancreas, adrenal glands, kidneys, abdominal aorta, and inferior vena cava are unremarkable. There is a left lower quadrant colostomy with a large peristomal hernia containing colon and small  bowel. There is mild proximal dilatation of small bowel up to the level of the hernia with decompressed distal small bowel. Contrast material only flows through to the hernia. There is fluid in the hernia with mesenteric stranding suggesting possible incarceration. Can't exclude vascular insult. Colon is decompressed outside of the hernia. Diverticulosis of the colon. No free air or free fluid in the abdomen. Pelvis: No pelvic mass or lymphadenopathy. Bladder wall is not thickened. Appendix is normal. Degenerative changes in the spine and hips. Slight anterior subluxation of L4 on L5. No destructive bone lesions. IMPRESSION: There is a large left lower quadrant peristomal hernia containing small bowel. Small bowel proximal to the hernia appear mildly dilated. Small bowel loops within the hernia are dilated. Distal small bowel is decompressed. There is infiltration and edema in the mesentery within the hernia. Findings suggest incarceration with small bowel obstruction and possible vascular insult. These results were called by telephone at the time of interpretation on 02/04/2015 at 4:47 am to Dr. Hinda Kehr , who verbally acknowledged these results. Electronically Signed   By: Lucienne Capers M.D.   On: 02/04/2015 04:51   Dg Abd 2 Views  02/06/2015  CLINICAL DATA:  Small bowel obstruction.  Left lower quadrant pain. EXAM: ABDOMEN - 2 VIEW COMPARISON:  05/08/2013.  CT 02/04/2015. FINDINGS: Mildly prominent left abdominal small bowel loops as seen on prior CT. Scattered air-fluid levels. No free air organomegaly. Prior cholecystectomy. IMPRESSION: Continued dilated left abdominal small bowel loops as seen on prior CT. No significant change. No free air. Electronically Signed   By: Rolm Baptise M.D.   On: 02/06/2015 08:25     Assessment and Recommendation  64 y.o. female with moderate aortic valve stenosis essential hypertension diabetes with complication having abdominal discomfort due to bowel obstruction  and hernia without evidence of congestive heart failure or angina at this time at lowest risk possible for cardiac complication with surgical intervention 1. No further cardiac diagnostics necessary at this time due to recent stress test and echocardiogram showing stable aortic valve stenosis 2. Proceed to surgery without restrictions due to stability of above from cardiac standpoint 3. No change in hydralazine and metoprolol as well as angiotensin receptor blocker for hypertension control 4. No restrictions to rehab and further bowel care 5. Call if further quwtions  Signed, Serafina Royals M.D. FACC

## 2015-02-06 NOTE — Progress Notes (Signed)
[name] [facility] [place] NAME: [name]    MR#:  [id number]  DATE OF BIRTH:  [birth year]  SUBJECTIVE:  CHIEF COMPLAINT:   Chief Complaint  Patient presents with  . Abdominal Pain   No ostomy output, increased pain and NG product. Plan is for surgery this afternoon.  REVIEW OF SYSTEMS:   Review of Systems  Constitutional: Negative for fever.  Respiratory: Negative for shortness of breath.   Cardiovascular: Negative for chest pain and palpitations.  Gastrointestinal: Positive for nausea. Negative for vomiting and abdominal pain.  Genitourinary: Negative for dysuria.    DRUG ALLERGIES:  No Known Allergies  VITALS:  Blood pressure 160/89, pulse 76, temperature 96.7 F (35.9 C), temperature source Tympanic, resp. rate 16, height 5\' 3"  (1.6 m), weight 102.059 kg (225 lb), SpO2 99 %.  PHYSICAL EXAMINATION:  GENERAL:  64 y.o.-year-old patient lying in the bed with no acute distress. Obese. NG tube. LUNGS: Normal breath sounds bilaterally, no wheezing, rales,rhonchi or crepitation. No use of accessory muscles of respiration.  CARDIOVASCULAR: S1, S2 normal. No murmurs, rubs, or gallops.  ABDOMEN: Soft, nontender, nondistended. Bowel sounds decreased. No guarding. No rebound. Ostomy in left lower quadrant with no excretions. Bag is empty. Swelling reduced around the ostomy. EXTREMITIES: No pedal edema, cyanosis, or clubbing.  NEUROLOGIC: Cranial nerves II through XII are intact. Muscle strength 5/5 in all extremities. Sensation intact. Gait not checked.  PSYCHIATRIC: The patient is alert and oriented x 3.  SKIN: No obvious rash, lesion, or ulcer.    LABORATORY PANEL:   CBC  Recent Labs Lab 02/06/15 0650  WBC 8.0  HGB 11.9*  HCT 38.3  PLT 222   ------------------------------------------------------------------------------------------------------------------  Chemistries   Recent Labs Lab 02/04/15 0203 02/05/15 0637  02/06/15 0650  NA 138 138 139  K 5.1 4.6 4.7  CL 107 106 108  CO2 22 26 24   GLUCOSE 250* 184* 155*  BUN 29* 27* 24*  CREATININE 1.90* 2.26* 2.17*  CALCIUM 8.3* 8.1* 8.2*  MG  --  2.1  --   AST 20  --   --   ALT 13*  --   --   ALKPHOS 97  --   --   BILITOT 0.9  --   --    ------------------------------------------------------------------------------------------------------------------  Cardiac Enzymes No results for input(s): TROPONINI in the last 168 hours. ------------------------------------------------------------------------------------------------------------------  RADIOLOGY:  Dg Abd 2 Views  02/06/2015  CLINICAL DATA:  Small bowel obstruction.  Left lower quadrant pain. EXAM: ABDOMEN - 2 VIEW COMPARISON:  05/08/2013.  CT 02/04/2015. FINDINGS: Mildly prominent left abdominal small bowel loops as seen on prior CT. Scattered air-fluid levels. No free air organomegaly. Prior cholecystectomy. IMPRESSION: Continued dilated left abdominal small bowel loops as seen on prior CT. No significant change. No free air. Electronically Signed   By: Rolm Baptise M.D.   On: 02/06/2015 08:25    EKG:   Orders placed or performed during the hospital encounter of 02/04/15  . ED EKG  . ED EKG  . EKG 12-Lead  . EKG 12-Lead    ASSESSMENT AND PLAN:    #1 small bowel obstruction, possible incarceration of small bowel in the periostomal hernia: - Plan per surgery, plan is for surgery today  #2 diabetes mellitus type 2: - hemoglobin A1c is 9.2, poor control in outpatient setting - She is currently nothing by mouth due to SBO, receiving D5 - Continue sliding scale  #3 hypertension: - Blood pressure  elevated. Continue metoprolol. Increase hydralazine. Will hold Avapro   #4 hyperkalemia -Resolved  #5 chronic kidney disease stage III - Renal function stable - Continue to hold nephrotoxins including Avapro for now  #6 hypothyroidism -  TSH slightly elevated at 4.5, continue  Synthroid  #7 hyperlipidemia - Hold statin for now  #8 preop clearance: She has been seen by cardiology and from cardiac standpoint is cleared for surgery. No other acute medical concerns at this time.  All the records are reviewed and case discussed with Care Management/Social Workerr. Management plans discussed with the patient, family and they are in agreement.  CODE STATUS: Full   TOTAL TIME TAKING CARE OF THIS PATIENT: 25 minutes.  Greater than 50% of time spent in care coordination and counseling. POSSIBLE D/C IN 1-2 DAYS, DEPENDING ON CLINICAL CONDITION.   Myrtis Ser M.D on 02/06/2015 at 2:57 PM  Between 7am to 6pm - Pager - [phone]  After 6pm go to [email or web address] - password [email or web address] [facility]  [facility]  Office  [phone]  CC: Primary care physician; Adin Hector, MD

## 2015-02-06 NOTE — Plan of Care (Signed)
Problem: Safety: Goal: Ability to remain free from injury will improve Outcome: Progressing Pt has called for assistance when needing to get OOB during my shift.  Problem: Pain Managment: Goal: General experience of comfort will improve Outcome: Not Progressing Pt continues to require pain medication every two hours during my shift.   Problem: Bowel/Gastric: Goal: Will not experience complications related to bowel motility Outcome: Progressing Pt currently has hypoactive to no bowel sounds during my shift.   Comments:  Pt currently in Lap surgery for hernia and SBO. Will continue to monitor.

## 2015-02-07 ENCOUNTER — Encounter: Payer: Self-pay | Admitting: Surgery

## 2015-02-07 LAB — BASIC METABOLIC PANEL
Anion gap: 5 (ref 5–15)
BUN: 21 mg/dL — AB (ref 6–20)
CALCIUM: 7.6 mg/dL — AB (ref 8.9–10.3)
CO2: 22 mmol/L (ref 22–32)
CREATININE: 2.22 mg/dL — AB (ref 0.44–1.00)
Chloride: 112 mmol/L — ABNORMAL HIGH (ref 101–111)
GFR calc Af Amer: 26 mL/min — ABNORMAL LOW (ref >60)
GFR, EST NON AFRICAN AMERICAN: 22 mL/min — AB (ref >60)
GLUCOSE: 192 mg/dL — AB (ref 65–99)
Potassium: 4.8 mmol/L (ref 3.5–5.1)
Sodium: 139 mmol/L (ref 135–145)

## 2015-02-07 LAB — GLUCOSE, CAPILLARY
GLUCOSE-CAPILLARY: 171 mg/dL — AB (ref 65–99)
Glucose-Capillary: 155 mg/dL — ABNORMAL HIGH (ref 65–99)
Glucose-Capillary: 173 mg/dL — ABNORMAL HIGH (ref 65–99)

## 2015-02-07 LAB — CBC
HCT: 37.4 % (ref 35.0–47.0)
HEMATOCRIT: 38 % (ref 35.0–47.0)
HEMOGLOBIN: 11.6 g/dL — AB (ref 12.0–16.0)
Hemoglobin: 11.5 g/dL — ABNORMAL LOW (ref 12.0–16.0)
MCH: 26.3 pg (ref 26.0–34.0)
MCH: 26 pg (ref 26.0–34.0)
MCHC: 30.7 g/dL — AB (ref 32.0–36.0)
MCHC: 30.5 g/dL — ABNORMAL LOW (ref 32.0–36.0)
MCV: 85.7 fL (ref 80.0–100.0)
MCV: 85.3 fL (ref 80.0–100.0)
PLATELETS: 248 10*3/uL (ref 150–440)
Platelets: 218 10*3/uL (ref 150–440)
RBC: 4.37 MIL/uL (ref 3.80–5.20)
RBC: 4.45 MIL/uL (ref 3.80–5.20)
RDW: 15 % — AB (ref 11.5–14.5)
RDW: 15.2 % — ABNORMAL HIGH (ref 11.5–14.5)
WBC: 11.6 10*3/uL — ABNORMAL HIGH (ref 3.6–11.0)
WBC: 14.9 10*3/uL — AB (ref 3.6–11.0)

## 2015-02-07 LAB — BLOOD GAS, ARTERIAL
ALLENS TEST (PASS/FAIL): POSITIVE — AB
Acid-base deficit: 4.8 mmol/L — ABNORMAL HIGH (ref 0.0–2.0)
BICARBONATE: 20.5 meq/L — AB (ref 21.0–28.0)
FIO2: 28
O2 Saturation: 95.6 %
PH ART: 7.34 — AB (ref 7.350–7.450)
Patient temperature: 37
pCO2 arterial: 38 mmHg (ref 32.0–48.0)
pO2, Arterial: 84 mmHg (ref 83.0–108.0)

## 2015-02-07 MED ORDER — SODIUM CHLORIDE 0.9 % IV SOLN: INTRAVENOUS | Status: DC

## 2015-02-07 MED ORDER — KCL IN DEXTROSE-NACL 20-5-0.45 MEQ/L-%-% IV SOLN
INTRAVENOUS | Status: DC
  Administered 2015-02-07 (×2): via INTRAVENOUS
  Filled 2015-02-07 (×6): qty 1000

## 2015-02-07 MED ORDER — METOPROLOL SUCCINATE ER 50 MG PO TB24
50.0000 mg | ORAL_TABLET | Freq: Once | ORAL | Status: AC
  Administered 2015-02-07: 50 mg via ORAL
  Filled 2015-02-07: qty 1

## 2015-02-07 MED ORDER — METOPROLOL TARTRATE 1 MG/ML IV SOLN
5.0000 mg | Freq: Four times a day (QID) | INTRAVENOUS | Status: DC
  Administered 2015-02-07 – 2015-02-08 (×4): 5 mg via INTRAVENOUS
  Filled 2015-02-07 (×4): qty 5

## 2015-02-07 MED ORDER — DILTIAZEM HCL 60 MG PO TABS: 60.0000 mg | ORAL_TABLET | Freq: Four times a day (QID) | ORAL | Status: DC | PRN

## 2015-02-07 MED ORDER — DILTIAZEM HCL ER COATED BEADS 120 MG PO CP24
120.0000 mg | ORAL_CAPSULE | Freq: Once | ORAL | Status: DC
  Filled 2015-02-07: qty 1

## 2015-02-07 NOTE — Progress Notes (Signed)
Spoke with Dr. Nehemiah Massed re pt's increased heart rate.  He wonders if possibly a post op problem, will give this Probation officer a call back in a little bit.

## 2015-02-07 NOTE — Progress Notes (Signed)
Dr. Marina Gravel called re pt's  Heart rate, new orders received

## 2015-02-07 NOTE — Progress Notes (Signed)
Pt has maintained a HR 140-150 since 0700 this morning. Despite interventions (See MAR), HR continues to be elevated and BP starting to drop (See Flowsheets). After discussing concerns with charge nurse, Dr Marina Gravel was called and informed of concerns, as well. Dr Marina Gravel is agreeable to pt being transferred to Telemetry for closer observation.

## 2015-02-07 NOTE — Progress Notes (Signed)
64 yo bf transferred from [facility] with tachycardia.  No distress on 2LO2 per [place].  Cardiac monitor placed on pt and verified by Vicente Males, CNA.  Pt denies chest pain.  Lungs diminished lower lobes bil.  Abdomen with hypoactive bowel sounds x 4 quads.  Dressing dry and intact to abdomen.  Lt quad colostomy with pink stoma.  JP intact to bulb suction.  NGT to Lt nare to LIWS with green drainage.  Placement checked and verified by auscultation.   Foley patent with yellow urine.  IVF/PCA infusing well rt upper arm PICC line.  Pt oriented to room and surroundings.  Family at bedside.  CB in reach, SR up x 3.

## 2015-02-07 NOTE — Progress Notes (Signed)
[facility] [place] NAME: [name]    MR#:  [id number]  DATE OF BIRTH:  [birth year]  SUBJECTIVE:  CHIEF COMPLAINT:   Chief Complaint  Patient presents with  . Abdominal Pain   Has had hernia repair yesterday afternoon. Not tachycardic. Very uncomfortable. Has PCA pump in place  REVIEW OF SYSTEMS:   Review of Systems  Constitutional: Negative for fever.  Respiratory: Negative for shortness of breath.   Cardiovascular: Negative for chest pain and palpitations.  Gastrointestinal: Positive for nausea. Negative for vomiting and abdominal pain.  Genitourinary: Negative for dysuria.    DRUG ALLERGIES:  No Known Allergies  VITALS:  Blood pressure 99/68, pulse 146, temperature 99.1 F (37.3 C), temperature source Oral, resp. rate 17, height 5\' 3"  (1.6 m), weight 101.47 kg (223 lb 11.2 oz), SpO2 100 %.  PHYSICAL EXAMINATION:  GENERAL:  64 y.o.-year-old patient lying in the bed with no acute distress. Obese. NG tube. LUNGS: Normal breath sounds bilaterally, no wheezing, rales,rhonchi or crepitation. No use of accessory muscles of respiration.  CARDIOVASCULAR: S1, S2 normal. No murmurs, rubs, or gallops.  ABDOMEN: Soft, tender around midline surgical scar and ostomy and left lower quadrant, nondistended. Bowel sounds decreased.  EXTREMITIES: No pedal edema, cyanosis, or clubbing.  NEUROLOGIC: Cranial nerves II through XII are intact. Muscle strength 5/5 in all extremities. Sensation intact. Gait not checked.  PSYCHIATRIC: The patient is alert and oriented x 3.  SKIN: No obvious rash, lesion, or ulcer.    LABORATORY PANEL:   CBC  Recent Labs Lab 02/07/15 0804  WBC 11.6*  HGB 11.5*  HCT 37.4  PLT 248   ------------------------------------------------------------------------------------------------------------------  Chemistries   Recent Labs Lab 02/04/15 0203 02/05/15 0637  02/07/15 0804  NA 138 138  < > 139  K  5.1 4.6  < > 4.8  CL 107 106  < > 112*  CO2 22 26  < > 22  GLUCOSE 250* 184*  < > 192*  BUN 29* 27*  < > 21*  CREATININE 1.90* 2.26*  < > 2.22*  CALCIUM 8.3* 8.1*  < > 7.6*  MG  --  2.1  --   --   AST 20  --   --   --   ALT 13*  --   --   --   ALKPHOS 97  --   --   --   BILITOT 0.9  --   --   --   < > = values in this interval not displayed. ------------------------------------------------------------------------------------------------------------------  Cardiac Enzymes No results for input(s): TROPONINI in the last 168 hours. ------------------------------------------------------------------------------------------------------------------  RADIOLOGY:  Dg Chest 1 View  02/06/2015  CLINICAL DATA:  64 year old female with right-sided PICC placement. EXAM: CHEST 1 VIEW COMPARISON:  Earlier radiograph dated 02/06/15 FINDINGS: There has been interval retraction of the right-sided PICC with tip now at the cavoatrial junction. An enteric tube is partially visualized coursing towards the left upper abdomen. The tip of the enteric tube is not well visualized. The lungs are clear. No pleural effusion or pneumothorax. The cardiac silhouette is within normal limits. The cervical fixation plate and screw noted. IMPRESSION: Interval retraction of the right-sided PICC with tip now at the cavoatrial junction. Electronically Signed   By: Anner Crete M.D.   On: 02/06/2015 23:13   Dg Chest 1 View  02/06/2015  CLINICAL DATA:  PICC placement EXAM: CHEST 1 VIEW COMPARISON:  05/09/2013 FINDINGS: Right upper extremity PICC, tip  at the low right atrium. This is already been corrected at time of dictation. Limited evaluation of nasogastric tube tip due to underpenetration the level of the upper abdomen. There is no edema, consolidation, effusion, or pneumothorax. Normal heart size and stable mediastinal contours. IMPRESSION: 1. Right upper extremity PICC with tip at the low right atrium, already corrected at  time of dictation. 2. Limited assessment of nasogastric tube positioning due to technical factors. Electronically Signed   By: Monte Fantasia M.D.   On: 02/06/2015 23:12   Dg Abd 2 Views  02/06/2015  CLINICAL DATA:  Small bowel obstruction.  Left lower quadrant pain. EXAM: ABDOMEN - 2 VIEW COMPARISON:  05/08/2013.  CT 02/04/2015. FINDINGS: Mildly prominent left abdominal small bowel loops as seen on prior CT. Scattered air-fluid levels. No free air organomegaly. Prior cholecystectomy. IMPRESSION: Continued dilated left abdominal small bowel loops as seen on prior CT. No significant change. No free air. Electronically Signed   By: Rolm Baptise M.D.   On: 02/06/2015 08:25    EKG:   Orders placed or performed during the hospital encounter of 02/04/15  . ED EKG  . ED EKG  . EKG 12-Lead  . EKG 12-Lead    ASSESSMENT AND PLAN:    #1 small bowel obstruction, possible incarceration of small bowel in the periostomal hernia: - Status post surgical reduction of hernia on 12/19  #2 diabetes mellitus type 2: - hemoglobin A1c is 9.2, poor control in outpatient setting - She is currently nothing by mouth due to SBO, receiving D5 - Continue sliding scale  #3 hypertension: - Blood pressure now decreased. Holding hydralazine, Avapro. Providing IV fluids  #4 hyperkalemia -Resolved  #5 chronic kidney disease stage III - Renal function decreased today likely due to hypotension - Continue to hold nephrotoxins including Avapro for now  #6 hypothyroidism -  TSH slightly elevated at 4.5, continue Synthroid  #7 hyperlipidemia - Hold statin for now  #8 tachycardia: Likely multifactorial due to pain and hypotension. Continue with fluids. Agree with metoprolol and Cardizem. Observe on telemetry. His sinus tachycardia, not atrial fibrillation at this time.  All the records are reviewed and case discussed with Care Management/Social Workerr. Management plans discussed with the patient, family and they  are in agreement.  CODE STATUS: Full   TOTAL TIME TAKING CARE OF THIS PATIENT: 25 minutes.  Greater than 50% of time spent in care coordination and counseling. POSSIBLE D/C IN 1-2 DAYS, DEPENDING ON CLINICAL CONDITION.   Myrtis Ser M.D on 02/07/2015 at 3:28 PM  Between 7am to 6pm - Pager - [phone]  After 6pm go to [email or web address] - password [email or web address] [facility]  [facility]  Office  [phone]  CC: Primary care physician; Adin Hector, MD

## 2015-02-07 NOTE — Progress Notes (Signed)
Surgery  I saw this patient earlier this morning. This is a late entry.  Over the course of the day the patient is been tachycardic. She had some mild pain last night. She had loss of intravenous access requiring a PICC line insertion. She has been on a morphine PCA since. She was transferred to telemetry after multiple doses of beta blocker and calcium channel blocker in an attempt to control her heart rate.  Filed Vitals:   02/07/15 1230 02/07/15 1357 02/07/15 1511 02/07/15 1601  BP: 92/69 100/65 99/68   Pulse: 147 115 146   Temp: 99 F (37.2 C)  99.1 F (37.3 C)   TempSrc: Oral  Oral   Resp: 20  17 21   Height:      Weight:      SpO2: 100%  100% 95%    I/O last 3 completed shifts: In: 4065.7 [I.V.:3975.7; NG/GT:90] Out: 64 [Urine:2150; Emesis/NG output:900; Drains:15; Blood:175] Total I/O In: 895.8 [I.V.:895.8] Out: 605 [Urine:350; Emesis/NG output:250; Drains:5]  CBC Latest Ref Rng 02/07/2015 02/06/2015 02/05/2015  WBC 3.6 - 11.0 K/uL 11.6(H) 8.0 8.8  Hemoglobin 12.0 - 16.0 g/dL 11.5(L) 11.9(L) 12.6  Hematocrit 35.0 - 47.0 % 37.4 38.3 40.3  Platelets 150 - 440 K/uL 248 222 237    BMP Latest Ref Rng 02/07/2015 02/06/2015 02/05/2015  Glucose 65 - 99 mg/dL 192(H) 155(H) 184(H)  BUN 6 - 20 mg/dL 21(H) 24(H) 27(H)  Creatinine 0.44 - 1.00 mg/dL 2.22(H) 2.17(H) 2.26(H)  Sodium 135 - 145 mmol/L 139 139 138  Potassium 3.5 - 5.1 mmol/L 4.8 4.7 4.6  Chloride 101 - 111 mmol/L 112(H) 108 106  CO2 22 - 32 mmol/L 22 24 26   Calcium 8.9 - 10.3 mg/dL 7.6(L) 8.2(L) 8.1(L)    On physical examination her abdomen is soft. There is some mild incisional tenderness. The dressings are dry. The Jackson-Pratt has serous fluid in it. The ostomy appears to be bruised but viable.  Impression/Plan:  Postoperative day #1 with sinus tachycardia. Unclear etiology   I do not think that there is a surgical etiology at this point. I will obtain a repeat CBC and ABG now. Her creatinine is too  elevated for a CT angiogram and as such a VQ scan will be obtained in the morning. I discussed this with the internist taking care of her. I had a long discussion with the patient and her family at the bedside and all her questions were answered.   Appreciate cardiology and internal medicine consultation assistance.

## 2015-02-08 LAB — CBC
HCT: 35.2 % (ref 35.0–47.0)
HEMOGLOBIN: 10.8 g/dL — AB (ref 12.0–16.0)
MCH: 26.1 pg (ref 26.0–34.0)
MCHC: 30.6 g/dL — AB (ref 32.0–36.0)
MCV: 85.2 fL (ref 80.0–100.0)
Platelets: 225 10*3/uL (ref 150–440)
RBC: 4.13 MIL/uL (ref 3.80–5.20)
RDW: 15.2 % — ABNORMAL HIGH (ref 11.5–14.5)
WBC: 12.5 10*3/uL — ABNORMAL HIGH (ref 3.6–11.0)

## 2015-02-08 LAB — GLUCOSE, CAPILLARY
GLUCOSE-CAPILLARY: 158 mg/dL — AB (ref 65–99)
GLUCOSE-CAPILLARY: 186 mg/dL — AB (ref 65–99)
GLUCOSE-CAPILLARY: 176 mg/dL — AB (ref 65–99)
Glucose-Capillary: 109 mg/dL — ABNORMAL HIGH (ref 65–99)

## 2015-02-08 LAB — BASIC METABOLIC PANEL
ANION GAP: 4 — AB (ref 5–15)
ANION GAP: 6 (ref 5–15)
BUN: 23 mg/dL — ABNORMAL HIGH (ref 6–20)
BUN: 24 mg/dL — ABNORMAL HIGH (ref 6–20)
CALCIUM: 7.2 mg/dL — AB (ref 8.9–10.3)
CO2: 22 mmol/L (ref 22–32)
CO2: 21 mmol/L — ABNORMAL LOW (ref 22–32)
Calcium: 7.1 mg/dL — ABNORMAL LOW (ref 8.9–10.3)
Chloride: 109 mmol/L (ref 101–111)
Chloride: 107 mmol/L (ref 101–111)
Creatinine, Ser: 2.57 mg/dL — ABNORMAL HIGH (ref 0.44–1.00)
Creatinine, Ser: 2.8 mg/dL — ABNORMAL HIGH (ref 0.44–1.00)
GFR, EST AFRICAN AMERICAN: 22 mL/min — AB (ref >60)
GFR, EST AFRICAN AMERICAN: 19 mL/min — AB (ref >60)
GFR, EST NON AFRICAN AMERICAN: 19 mL/min — AB (ref >60)
GFR, EST NON AFRICAN AMERICAN: 17 mL/min — AB (ref >60)
GLUCOSE: 159 mg/dL — AB (ref 65–99)
Glucose, Bld: 178 mg/dL — ABNORMAL HIGH (ref 65–99)
POTASSIUM: 5.6 mmol/L — AB (ref 3.5–5.1)
POTASSIUM: 5.1 mmol/L (ref 3.5–5.1)
SODIUM: 135 mmol/L (ref 135–145)
SODIUM: 134 mmol/L — AB (ref 135–145)

## 2015-02-08 LAB — TSH: TSH: 2.064 u[IU]/mL (ref 0.350–4.500)

## 2015-02-08 MED ORDER — DILTIAZEM HCL 60 MG PO TABS
60.0000 mg | ORAL_TABLET | Freq: Four times a day (QID) | ORAL | Status: DC
  Administered 2015-02-08 – 2015-02-09 (×5): 60 mg via ORAL
  Filled 2015-02-08 (×5): qty 1

## 2015-02-08 MED ORDER — DULOXETINE HCL 30 MG PO CPEP
30.0000 mg | ORAL_CAPSULE | Freq: Every day | ORAL | Status: DC
  Administered 2015-02-08 – 2015-02-15 (×7): 30 mg via ORAL
  Filled 2015-02-08 (×7): qty 1

## 2015-02-08 MED ORDER — OXYCODONE-ACETAMINOPHEN 5-325 MG PO TABS
1.0000 | ORAL_TABLET | Freq: Four times a day (QID) | ORAL | Status: DC | PRN
  Administered 2015-02-08 – 2015-02-10 (×3): 1 via ORAL
  Filled 2015-02-08 (×4): qty 1

## 2015-02-08 MED ORDER — TECHNETIUM TO 99M ALBUMIN AGGREGATED
3.9800 | Freq: Once | INTRAVENOUS | Status: AC | PRN
  Administered 2015-02-08: 3.98 via INTRAVENOUS

## 2015-02-08 MED ORDER — LEVOTHYROXINE SODIUM 100 MCG PO TABS
100.0000 ug | ORAL_TABLET | Freq: Every day | ORAL | Status: DC
  Administered 2015-02-08 – 2015-02-15 (×8): 100 ug via ORAL
  Filled 2015-02-08 (×8): qty 1

## 2015-02-08 MED ORDER — DEXTROSE-NACL 5-0.9 % IV SOLN
INTRAVENOUS | Status: DC
  Administered 2015-02-08: 09:00:00 via INTRAVENOUS

## 2015-02-08 MED ORDER — SODIUM CHLORIDE 0.9 % IV SOLN
INTRAVENOUS | Status: AC
Start: 2015-02-08 — End: 2015-02-09
  Administered 2015-02-08 – 2015-02-09 (×4): via INTRAVENOUS

## 2015-02-08 MED ORDER — METOPROLOL TARTRATE 50 MG PO TABS
50.0000 mg | ORAL_TABLET | Freq: Two times a day (BID) | ORAL | Status: DC
  Administered 2015-02-08 – 2015-02-14 (×12): 50 mg via ORAL
  Filled 2015-02-08 (×12): qty 1

## 2015-02-08 MED ORDER — MORPHINE SULFATE (PF) 2 MG/ML IV SOLN
2.0000 mg | INTRAVENOUS | Status: DC | PRN
  Administered 2015-02-08 – 2015-02-10 (×10): 2 mg via INTRAVENOUS
  Filled 2015-02-08 (×11): qty 1

## 2015-02-08 MED ORDER — TECHNETIUM TC 99M DIETHYLENETRIAME-PENTAACETIC ACID
32.4750 | Freq: Once | INTRAVENOUS | Status: AC | PRN
  Administered 2015-02-08: 32.475 via INTRAVENOUS

## 2015-02-08 MED ORDER — METOPROLOL TARTRATE 1 MG/ML IV SOLN
5.0000 mg | Freq: Four times a day (QID) | INTRAVENOUS | Status: DC
  Filled 2015-02-08: qty 5

## 2015-02-08 MED ORDER — SODIUM CHLORIDE 0.9 % IV BOLUS (SEPSIS)
500.0000 mL | Freq: Once | INTRAVENOUS | Status: AC
  Administered 2015-02-08: 500 mL via INTRAVENOUS

## 2015-02-08 NOTE — Progress Notes (Signed)
PT Cancellation Note  Patient Details Name: [name] MRN: [id number] DOB: 07-16-1950   Cancelled Treatment:    Reason Eval/Treat Not Completed: Medical issues which prohibited therapy.  Pt with elevated HR in 150's and potassium 5.6.  Pt also scheduled for VQ scan to r/o PE.  Pt does not appear appropriate to participate in PT at this time.  Will re-attempt PT eval at a later date/time as medically appropriate.   Raquel Sarna Iden Stripling 02/08/2015, 11:52 AM Leitha Bleak, [place]

## 2015-02-08 NOTE — Consult Note (Signed)
Date: 02/08/2015                  Patient Name:  [name]  MRN: [id number]  DOB: [birth year]  Age / Sex: 64 y.o., female         PCP: Adin Hector, MD                 Service Requesting Consult:  internal medicine                  Reason for Consult:  acute renal failure and hyperkalemia             History of Present Illness: Patient is a 64 y.o. 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  She was found to have small bowel obstruction from incarcerated parastomal hernia requiring laparotomy, reduction of the hernia and repair of parastomal hernia with mesh on December 19 Postoperatively, her creatinine and potassium are noted to be high. Nephrology consult has been requested It appears that her baseline creatinine is 1.93/GFR of 30 from August 2016 Creatinine is progressively increasing and is up to 2.57 today Potassium level is high at 5.6 Patient is also experiencing tachycardia. Her heart rate is about 150  Patient did undergo CT of abdomen and pelvis with IV contrast. Kidney images were unremarkable according to the report  Medications: Outpatient medications: Prescriptions prior to admission  Medication Sig Dispense Refill Last Dose  . atorvastatin (LIPITOR) 40 MG tablet Take 40 mg by mouth at bedtime.  2 02/03/2015 at Unknown time  . clobetasol cream (TEMOVATE) [id number] % Apply 1 application topically 2 (two) times daily as needed (rash).   2 prn at prn  . DULoxetine (CYMBALTA) 30 MG capsule Take 30 mg by mouth daily.   11 02/03/2015 at Unknown time  . glimepiride (AMARYL) 4 MG tablet Take 4 mg by mouth 2 (two) times daily.   3 02/03/2015 at Unknown time  . levothyroxine (SYNTHROID, LEVOTHROID) 88 MCG  tablet Take 88 mcg by mouth daily before breakfast.   2 02/03/2015 at Unknown time  . metoprolol succinate (TOPROL-XL) 25 MG 24 hr tablet Take 25 mg by mouth daily.  2 02/03/2015 at 1100  . nystatin (MYCOSTATIN/NYSTOP) 100000 UNIT/GM POWD Apply 1 Tube topically 2 (two) times daily as needed (rash).   3 prn at prn  . telmisartan (MICARDIS) 80 MG tablet Take 80 mg by mouth daily.   02/03/2015 at Unknown time  . bisacodyl (DULCOLAX) 5 MG EC tablet Take 4 tablets (20 mg total) by mouth once. Please see surgery prep instructions (Patient not taking: Reported on 02/04/2015) 4 tablet 0 Completed Course at Unknown time  . polyethylene glycol powder (GLYCOLAX/MIRALAX) powder Take 255 g by mouth once. Please see surgery prep instructions given at appointment (Patient not taking: Reported on 02/04/2015) 255 g 0 Completed Course at Unknown time  . sodium phosphate (FLEET) 7-19 GM/118ML ENEM Place 133 mLs (1 enema total) rectally once. Please see surgery prep instructions (Patient not taking: Reported on 02/04/2015) 1 Bottle 0 Completed Course at Unknown time    Current medications: Current Facility-Administered Medications  Medication Dose Route Frequency Provider Last Rate Last Dose  . acetaminophen (TYLENOL) tablet 650 mg  650 mg Oral Q6H PRN Sherri Rad, MD  Or  . acetaminophen (TYLENOL) suppository 650 mg  650 mg Rectal Q6H PRN Sherri Rad, MD      . dextrose 5 %-0.9 % sodium chloride infusion   Intravenous Continuous Sherri Rad, MD 100 mL/hr at 02/08/15 [phone]    . diltiazem (CARDIZEM) tablet 60 mg  60 mg Oral 4 times per day Aldean Jewett, MD   60 mg at 02/08/15 0850  . diphenhydrAMINE (BENADRYL) injection 12.5 mg  12.5 mg Intravenous Q6H PRN Sherri Rad, MD       Or  . diphenhydrAMINE (BENADRYL) 12.5 MG/5ML elixir 12.5 mg  12.5 mg Oral Q6H PRN Sherri Rad, MD      . enoxaparin (LOVENOX) injection 30 mg  30 mg Subcutaneous Q24H Sherri Rad, MD   30 mg at 02/07/15 2103  . hydrALAZINE (APRESOLINE) injection  10 mg  10 mg Intravenous Q4H PRN Clayburn Pert, MD      . insulin aspart (novoLOG) injection 0-5 Units  0-5 Units Subcutaneous QHS Aldean Jewett, MD   0 Units at 02/04/15 2256  . insulin aspart (novoLOG) injection 0-9 Units  0-9 Units Subcutaneous TID WC Aldean Jewett, MD   2 Units at 02/08/15 [phone]  . levothyroxine (SYNTHROID, LEVOTHROID) tablet 100 mcg  100 mcg Oral QAC breakfast Sherri Rad, MD   100 mcg at 02/08/15 0850  . metoprolol (LOPRESSOR) injection 5 mg  5 mg Intravenous 4 times per day Sherri Rad, MD   5 mg at 02/08/15 0830  . metoprolol succinate (TOPROL-XL) 24 hr tablet 25 mg  25 mg Oral Daily Sherri Rad, MD   25 mg at 02/08/15 0759  . morphine (MORPHINE) 2 mg/mL PCA injection   Intravenous 6 times per day Sherri Rad, MD      . naloxone [facility]) injection 0.4 mg  0.4 mg Intravenous PRN Sherri Rad, MD       And  . sodium chloride 0.9 % injection 9 mL  9 mL Intravenous PRN Sherri Rad, MD      . ondansetron [facility]) tablet 4 mg  4 mg Oral Q6H PRN Sherri Rad, MD       Or  . ondansetron [facility]) injection 4 mg  4 mg Intravenous Q6H PRN Sherri Rad, MD      . pantoprazole (PROTONIX) injection 40 mg  40 mg Intravenous Q24H Sherri Rad, MD   40 mg at 02/08/15 [id number]      Allergies: No Known Allergies    Past Medical History: Past Medical History  Diagnosis Date  . Diabetes mellitus without complication ([place])   . GI bleed   . Aortic valve stenosis   . Hyperlipidemia   . Hypertension   . Diverticulitis large intestine   . Obesity   . Shortness of breath dyspnea   . Hyperthyroidism   . Anxiety   . Arthritis   . Neuropathy in diabetes ([place])   . Anemia   . Vertigo   . Bowel obstruction [facility])      Past Surgical History: Past Surgical History  Procedure Laterality Date  . Colectomy    . Laparotomy closure of cecal perforation  05/09/2013    Dr. Marina Gravel  . Tonsillectomy    . Back surgery      spur frmoved from lower back  . Cardiac catheterization    . Abdominal hysterectomy     . Laparotomy N/A 02/06/2015    Procedure: Laparotomy, reduction of incarcerated parastomal hernia, repair of parastomal hernia with mesh;  Surgeon: Sherri Rad, MD;  Location: [facility] ORS;  Service: General;  Laterality: N/A;     Family History: Family History  Problem Relation Age of Onset  . Diabetes Mother   . Hypertension Father      Social History: Social History   Social History  . Marital Status: Divorced    Spouse Name: N/A  . Number of Children: N/A  . Years of Education: N/A   Occupational History  . Not on file.   Social History Main Topics  . Smoking status: Former Smoker -- 0.25 packs/day for 1 years    Types: Cigarettes    Quit date: 04/18/2013  . Smokeless tobacco: Never Used  . Alcohol Use: No  . Drug Use: No  . Sexual Activity: Not on file   Other Topics Concern  . Not on file   Social History Narrative     Review of Systems: Gen: No fevers or chills, pain from surgery HEENT: No problems reported CV: Tachycardia with heart rate in the 150 range Resp: Some mild baseline shortness of breath GI: Now able to take clears, recent GI surgery GU : No problems reported with voiding MS: No acute problems reported Derm:  No acute problems reported Psych: No acute problems Heme: No acute problems Neuro: No acute problems reported Endocrine. Long-standing diabetes. No acute issues reported  Vital Signs: Blood pressure 102/77, pulse 147, temperature 98.6 F (37 C), temperature source Oral, resp. rate 18, height 5\' 3"  (1.6 m), weight 102.059 kg (225 lb), SpO2 99 %.   Intake/Output Summary (Last 24 hours) at 02/08/15 1008 Last data filed at 02/08/15 0954  Gross per 24 hour  Intake 1141.66 ml  Output   1740 ml  Net -598.34 ml    Weight trends: Filed Weights   02/06/15 1237 02/07/15 0500 02/08/15 0500  Weight: 102.059 kg (225 lb) 101.47 kg (223 lb 11.2 oz) 102.059 kg (225 lb)    Physical Exam: General:  morbidly obese lady, laying in the bed,  no distress   HEENT  anicteric sclera, moist oral mucous membranes   Neck:  supple, no masses   Lungs:  Limited exam but normal respiratory effort, mild scattered rhonchi otherwise clear   Heart::  tachycardic, soft systolic murmur   Abdomen:  obese, colostomy in place, bowel sounds present   Extremities:  trace peripheral edema   Neurologic:  alert, oriented, speech normal   Skin:  no acute rashes   Access:   Foley:        Lab results: Basic Metabolic Panel:  Recent Labs Lab 02/05/15 0637 02/06/15 0650 02/07/15 0804 02/08/15 0510  NA 138 139 139 135  K 4.6 4.7 4.8 5.6*  CL 106 108 112* 109  CO2 26 24 22 22   GLUCOSE 184* 155* 192* 178*  BUN 27* 24* 21* 23*  CREATININE 2.26* 2.17* 2.22* 2.57*  CALCIUM 8.1* 8.2* 7.6* 7.1*  MG 2.1  --   --   --   PHOS 4.7*  --   --   --     Liver Function Tests:  Recent Labs Lab 02/04/15 0203  AST 20  ALT 13*  ALKPHOS 97  BILITOT 0.9  PROT 7.4  ALBUMIN 3.7    Recent Labs Lab 02/04/15 0203  LIPASE 21   No results for input(s): AMMONIA in the last 168 hours.  CBC:  Recent Labs Lab 02/07/15 1759 02/08/15 0510  WBC 14.9* 12.5*  HGB 11.6* 10.8*  HCT 38.0 35.2  MCV 85.3 85.2  PLT 218 225  Cardiac Enzymes: No results for input(s): CKTOTAL, TROPONINI in the last 168 hours.  BNP: Invalid input(s): POCBNP  CBG:  Recent Labs Lab 02/06/15 2133 02/07/15 0739 02/07/15 1147 02/07/15 1625 02/08/15 0726  GLUCAP 153* 155* 173* 171* 158*    Microbiology: No results found for this or any previous visit (from the past 720 hour(s)).   Coagulation Studies: No results for input(s): LABPROT, INR in the last 72 hours.  Urinalysis: No results for input(s): COLORURINE, LABSPEC, PHURINE, GLUCOSEU, HGBUR, BILIRUBINUR, KETONESUR, PROTEINUR, UROBILINOGEN, NITRITE, LEUKOCYTESUR in the last 72 hours.  Invalid input(s): APPERANCEUR      Imaging: Dg Chest 1 View  02/06/2015  CLINICAL DATA:  64 year old female with  right-sided PICC placement. EXAM: CHEST 1 VIEW COMPARISON:  Earlier radiograph dated 02/06/15 FINDINGS: There has been interval retraction of the right-sided PICC with tip now at the cavoatrial junction. An enteric tube is partially visualized coursing towards the left upper abdomen. The tip of the enteric tube is not well visualized. The lungs are clear. No pleural effusion or pneumothorax. The cardiac silhouette is within normal limits. The cervical fixation plate and screw noted. IMPRESSION: Interval retraction of the right-sided PICC with tip now at the cavoatrial junction. Electronically Signed   By: Anner Crete M.D.   On: 02/06/2015 23:13   Dg Chest 1 View  02/06/2015  CLINICAL DATA:  PICC placement EXAM: CHEST 1 VIEW COMPARISON:  05/09/2013 FINDINGS: Right upper extremity PICC, tip at the low right atrium. This is already been corrected at time of dictation. Limited evaluation of nasogastric tube tip due to underpenetration the level of the upper abdomen. There is no edema, consolidation, effusion, or pneumothorax. Normal heart size and stable mediastinal contours. IMPRESSION: 1. Right upper extremity PICC with tip at the low right atrium, already corrected at time of dictation. 2. Limited assessment of nasogastric tube positioning due to technical factors. Electronically Signed   By: Monte Fantasia M.D.   On: 02/06/2015 23:12      Assessment & Plan: Pt is a 64 y.o. 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 Acute renal failure on chronic kidney disease stage III Baseline creatinine appears to be 1.93/GFR 30 from August 2016 Acute renal failure is likely secondary to multiple factors including IV  contrast exposure, possible ATN from concurrent events At present, urine output is noted to be 875 cc. Patient does have some NG output recorded also Electrolytes and volume status is acceptable No acute indication for dialysis at present However, we will continue to monitor electrolytes and volume status are daily basis  2. Hyperkalemia Patient was previously getting IV fluids with potassium supplementation There has been stopped now I would not recommend use of Kayexalate due to recent bowel surgery If potassium continues to be a problem, shifting measures should be utilized such as insulin/D50 and IV bicarbonate

## 2015-02-08 NOTE — Progress Notes (Signed)
[facility] Encounter Note  Patient: [name] / Admit Date: 02/04/2015 / Date of Encounter: 02/08/2015, 1:14 PM   Subjective: Hemodynamically stable today with some abdominal tenderness and with contiued tachcyardia consistant with aflutter  Review of Systems: Positive for: Abdominal tenderness   Negative for: Vision change, hearing change, syncope, dizziness, nausea, vomiting,diarrhea, bloody stool,  Others previously listed  Objective: Telemetry: tachycardia consistatn with flutter Physical Exam: Blood pressure 104/51, pulse 154, temperature 99.4 F (37.4 C), temperature source Oral, resp. rate 19, height 5\' 3"  (1.6 m), weight 225 lb (102.059 kg), SpO2 99 %. Body mass index is 39.87 kg/(m^2). General: Well developed, well nourished, in no acute distress. Head: Normocephalic, atraumatic, sclera non-icteric, no xanthomas, nares are without discharge. Neck: No apparent masses Lungs: Normal respirations with no wheezes, no rhonchi, no rales , some crackles   Heart: irregular rate and rhythm, normal S1 soft S2, 3-4+ aortic murmur, no rub, no gallop, PMI is normal size and placement, carotid upstroke normal with bruit, jugular venous pressure normal Abdomen: Soft,  tender,  distended with normoactive bowel sounds. No hepatosplenomegaly. Abdominal aorta is normal size without bruit Extremities: traca edema, no clubbing, no cyanosis, no ulcers,  Peripheral: 2+ radial, 2+ femoral, 2+ dorsal pedal pulses Neuro: Alert and oriented. Moves all extremities spontaneously. Psych:  Responds to questions appropriately with a normal affect.   Intake/Output Summary (Last 24 hours) at 02/08/15 1314 Last data filed at 02/08/15 1300  Gross per 24 hour  Intake 1621.66 ml  Output   2040 ml  Net -418.34 ml    Inpatient Medications:  . diltiazem  60 mg Oral 4 times per day  . enoxaparin (LOVENOX) injection  30 mg Subcutaneous Q24H  . insulin aspart  0-5 Units Subcutaneous  QHS  . insulin aspart  0-9 Units Subcutaneous TID WC  . levothyroxine  100 mcg Oral QAC breakfast  . metoprolol  5 mg Intravenous 4 times per day  . metoprolol succinate  25 mg Oral Daily  . pantoprazole (PROTONIX) IV  40 mg Intravenous Q24H   Infusions:  . dextrose 5 % and 0.9% NaCl 125 mL/hr at 02/08/15 1214    Labs:  Recent Labs  02/07/15 0804 02/08/15 0510  NA 139 135  K 4.8 5.6*  CL 112* 109  CO2 22 22  GLUCOSE 192* 178*  BUN 21* 23*  CREATININE 2.22* 2.57*  CALCIUM 7.6* 7.1*   No results for input(s): AST, ALT, ALKPHOS, BILITOT, PROT, ALBUMIN in the last 72 hours.  Recent Labs  02/07/15 1759 02/08/15 0510  WBC 14.9* 12.5*  HGB 11.6* 10.8*  HCT 38.0 35.2  MCV 85.3 85.2  PLT 218 225   No results for input(s): CKTOTAL, CKMB, TROPONINI in the last 72 hours. Invalid input(s): POCBNP No results for input(s): HGBA1C in the last 72 hours.   Weights: Filed Weights   02/06/15 1237 02/07/15 0500 02/08/15 0500  Weight: 225 lb (102.059 kg) 223 lb 11.2 oz (101.47 kg) 225 lb (102.059 kg)     Radiology/Studies:  Dg Chest 1 View  02/06/2015  CLINICAL DATA:  64 year old female with right-sided PICC placement. EXAM: CHEST 1 VIEW COMPARISON:  Earlier radiograph dated 02/06/15 FINDINGS: There has been interval retraction of the right-sided PICC with tip now at the cavoatrial junction. An enteric tube is partially visualized coursing towards the left upper abdomen. The tip of the enteric tube is not well visualized. The lungs are clear. No pleural effusion or pneumothorax. The cardiac silhouette is  within normal limits. The cervical fixation plate and screw noted. IMPRESSION: Interval retraction of the right-sided PICC with tip now at the cavoatrial junction. Electronically Signed   By: Anner Crete M.D.   On: 02/06/2015 23:13   Dg Chest 1 View  02/06/2015  CLINICAL DATA:  PICC placement EXAM: CHEST 1 VIEW COMPARISON:  05/09/2013 FINDINGS: Right upper extremity PICC, tip  at the low right atrium. This is already been corrected at time of dictation. Limited evaluation of nasogastric tube tip due to underpenetration the level of the upper abdomen. There is no edema, consolidation, effusion, or pneumothorax. Normal heart size and stable mediastinal contours. IMPRESSION: 1. Right upper extremity PICC with tip at the low right atrium, already corrected at time of dictation. 2. Limited assessment of nasogastric tube positioning due to technical factors. Electronically Signed   By: Monte Fantasia M.D.   On: 02/06/2015 23:12   Ct Abdomen Pelvis W Contrast  02/04/2015  CLINICAL DATA:  Severe nausea and abdominal pain. History of diverticulitis, GI bleed, bowel obstruction, colectomy, cecal perforation, and hysterectomy. Technically issues with the scanner necessitated moving the patient to a different scanner for delayed imaging peer EXAM: CT ABDOMEN AND PELVIS WITH CONTRAST TECHNIQUE: Multidetector CT imaging of the abdomen and pelvis was performed using the standard protocol following bolus administration of intravenous contrast. CONTRAST:  47mL OMNIPAQUE IOHEXOL 300 MG/ML  SOLN COMPARISON:  07/09/2013 FINDINGS: The lung bases are clear.  Coronary artery calcification. Surgical absence of the gallbladder. No bile duct dilatation. The liver, spleen, pancreas, adrenal glands, kidneys, abdominal aorta, and inferior vena cava are unremarkable. 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 There is mild proximal dilatation of small bowel up to the level of the hernia with decompressed distal small bowel. Contrast material only flows through to the hernia. There is fluid in the hernia with mesenteric stranding suggesting possible incarceration. Can't exclude vascular insult. Colon is decompressed outside of the hernia. Diverticulosis of the colon. No free air or free fluid in the abdomen. Pelvis: No pelvic mass or lymphadenopathy.  Bladder wall is not thickened. Appendix is normal. Degenerative changes in the spine and hips. Slight anterior subluxation of L4 on L5. No destructive bone lesions. IMPRESSION: There is a large left lower quadrant peristomal hernia containing small bowel. Small bowel proximal to the hernia appear mildly dilated. Small bowel loops within the hernia are dilated. Distal small bowel is decompressed. There is infiltration and edema in the mesentery within the hernia. Findings suggest incarceration with small bowel obstruction and possible vascular insult. These results were called by telephone at the time of interpretation on 02/04/2015 at 4:47 am to Dr. Hinda Kehr , who verbally acknowledged these results. Electronically Signed   By: Lucienne Capers M.D.   On: 02/04/2015 04:51   Dg Abd 2 Views  02/06/2015  CLINICAL DATA:  Small bowel obstruction.  Left lower quadrant pain. EXAM: ABDOMEN - 2 VIEW COMPARISON:  05/08/2013.  CT 02/04/2015. FINDINGS: Mildly prominent left abdominal small bowel loops as seen on prior CT. Scattered air-fluid levels. No free air organomegaly. Prior cholecystectomy. IMPRESSION: Continued dilated left abdominal small bowel loops as seen on prior CT. No significant change. No free air. Electronically Signed   By: Rolm Baptise M.D.   On: 02/06/2015 08:25     Assessment and Recommendation  64 y.o. female with moderate aortic valve stenosis essential hypertension diabetes with complication having abdominal discomfort due to bowel obstruction  sp surgery   without evidence of congestive heart failure or angina at this time but post op tachycardia consisitant with aflutter and or cuased by pain obstruction acute renal failure 1. No further cardiac diagnostics necessary at this time due to recent stress test and echocardiogram showing stable aortic valve stenosis 2.continue supportive care sp surgery and investigate cuase of tachycrdia 3.dc hdralazine and increase dosages of diltiazem and  metoprolol for heart rate control and wait for possible conversion after improved from surgery and real failure 4. No restrictions to rehab and further bowel care 5. Call if further quwtions  Signed, Serafina Royals M.D. FACC

## 2015-02-08 NOTE — Progress Notes (Signed)
Pt Heart Rate is in the 150's Dr Volanda Napoleon notified os sustain elevated HR. No new orders at this moment. Pt denies chest pain or any other discomfort. Will continue to monitor

## 2015-02-08 NOTE — Progress Notes (Signed)
Spoke with Dr Marina Gravel about pt still having pain after getting percocet. Have tried twice today to get in touch  with him to let him know about patient with no responds to my page. MD order morphine to be given IV PRN . Will follow orders

## 2015-02-08 NOTE — Progress Notes (Signed)
Surgery  POD 2   S/P ex lap repair of incarcerated parastomal hernia. She is feeling better. Pain is less. She remains tachycardic.  She denies chest pain.  Filed Vitals:   02/08/15 0431 02/08/15 0500 02/08/15 0626 02/08/15 0800  BP:   104/86 102/77  Pulse:   147 147  Temp:      TempSrc:      Resp: 18     Height:      Weight:  225 lb (102.059 kg)    SpO2: 99%       PE:  The patient is awake and alert. She was sleeping upon my arrival. The ostomy appears to be having some submucosal ecchymosis but appears viable. There is some liquid stool within the ostomy appliance. Digitalization of the ostomy demonstrated some liquid stool. Wounds appear to be intact with minimal drainage. Jackson-Pratt drain is minimal output. Lungs are clear bilaterally.  Labs  CBC Latest Ref Rng 02/07/2015 02/07/2015 02/06/2015  WBC 3.6 - 11.0 K/uL 14.9(H) 11.6(H) 8.0  Hemoglobin 12.0 - 16.0 g/dL 11.6(L) 11.5(L) 11.9(L)  Hematocrit 35.0 - 47.0 % 38.0 37.4 38.3  Platelets 150 - 440 K/uL 218 248 222   CMP Latest Ref Rng 02/08/2015 02/07/2015 02/06/2015  Glucose 65 - 99 mg/dL 178(H) 192(H) 155(H)  BUN 6 - 20 mg/dL 23(H) 21(H) 24(H)  Creatinine 0.44 - 1.00 mg/dL 2.57(H) 2.22(H) 2.17(H)  Sodium 135 - 145 mmol/L 135 139 139  Potassium 3.5 - 5.1 mmol/L 5.6(H) 4.8 4.7  Chloride 101 - 111 mmol/L 109 112(H) 108  CO2 22 - 32 mmol/L 22 22 24   Calcium 8.9 - 10.3 mg/dL 7.1(L) 7.6(L) 8.2(L)  Total Protein 6.5 - 8.1 g/dL - - -  Total Bilirubin 0.3 - 1.2 mg/dL - - -  Alkaline Phos 38 - 126 U/L - - -  AST 15 - 41 U/L - - -  ALT 14 - 54 U/L - - -    I/O last 3 completed shifts: In: 1931.7 [I.V.:1841.7; NG/GT:90] Out: 3305 [Urine:2075; Emesis/NG output:1200; Drains:30]   Foley was removed this am.  IMP:  Persistent tachycardia without clear etiology other than perhaps related to significant deconditioning and a postoperative state. I do not think there is an intra-abdominal etiology to account for this at this  point. I will obtain a ventilation perfusion scan this morning to help determine whether pulmonary embolism may be the etiology.  Creatinine is slightly increased and medicine has requested nephrology to consult.  As there is some air and stool in the bag I will discontinue her nasogastric tube and start clear liquid diet. I will check a TSH and restart her Synthroid. Physical therapy consult has been ordered. I have written for Lopressor 5 mg every 6 hrs. I will discuss with internal medicine about increasing her Cardizem by mouth if her blood pressure can tolerate this. Discontinue PCA pump that she is not using it.

## 2015-02-08 NOTE — Progress Notes (Signed)
[facility] [place] NAME: [name]    MR#:  [id number]  DATE OF BIRTH:  [birth year]  SUBJECTIVE:  CHIEF COMPLAINT:   Chief Complaint  Patient presents with  . Abdominal Pain   Denies pain. Sitting up eating clear breakfast. No chest pain, shortness of breath.  REVIEW OF SYSTEMS:   Review of Systems  Constitutional: Negative for fever.  Respiratory: Negative for shortness of breath.   Cardiovascular: Negative for chest pain and palpitations.  Gastrointestinal: Positive for nausea. Negative for vomiting and abdominal pain.  Genitourinary: Negative for dysuria.    DRUG ALLERGIES:  No Known Allergies  VITALS:  Blood pressure 106/65, pulse 151, temperature 99.4 F (37.4 C), temperature source Oral, resp. rate 19, height 5\' 3"  (1.6 m), weight 102.059 kg (225 lb), SpO2 99 %.  PHYSICAL EXAMINATION:  GENERAL:  64 y.o.-year-old patient lying in the bed with no acute distress. Obese.  LUNGS: Normal breath sounds bilaterally, no wheezing, rales,rhonchi or crepitation. No use of accessory muscles of respiration.  CARDIOVASCULAR: S1, S2 normal. No murmurs, rubs, or gallops. tachycardic ABDOMEN: Soft, tender around midline surgical scar and ostomy and left lower quadrant, nondistended. Bowel sounds decreased.  EXTREMITIES: No pedal edema, cyanosis, or clubbing.  NEUROLOGIC: Cranial nerves II through XII are intact. Muscle strength 5/5 in all extremities. Sensation intact. Gait not checked.  PSYCHIATRIC: The patient is alert and oriented x 3.  SKIN: No obvious rash, lesion, or ulcer.    LABORATORY PANEL:   CBC  Recent Labs Lab 02/08/15 0510  WBC 12.5*  HGB 10.8*  HCT 35.2  PLT 225   ------------------------------------------------------------------------------------------------------------------  Chemistries   Recent Labs Lab 02/04/15 0203 02/05/15 0637  02/08/15 0510  NA 138 138  < > 135  K 5.1 4.6  < > 5.6*  CL  107 106  < > 109  CO2 22 26  < > 22  GLUCOSE 250* 184*  < > 178*  BUN 29* 27*  < > 23*  CREATININE 1.90* 2.26*  < > 2.57*  CALCIUM 8.3* 8.1*  < > 7.1*  MG  --  2.1  --   --   AST 20  --   --   --   ALT 13*  --   --   --   ALKPHOS 97  --   --   --   BILITOT 0.9  --   --   --   < > = values in this interval not displayed. ------------------------------------------------------------------------------------------------------------------  Cardiac Enzymes No results for input(s): TROPONINI in the last 168 hours. ------------------------------------------------------------------------------------------------------------------  RADIOLOGY:  Dg Chest 1 View  02/06/2015  CLINICAL DATA:  64 year old female with right-sided PICC placement. EXAM: CHEST 1 VIEW COMPARISON:  Earlier radiograph dated 02/06/15 FINDINGS: There has been interval retraction of the right-sided PICC with tip now at the cavoatrial junction. An enteric tube is partially visualized coursing towards the left upper abdomen. The tip of the enteric tube is not well visualized. The lungs are clear. No pleural effusion or pneumothorax. The cardiac silhouette is within normal limits. The cervical fixation plate and screw noted. IMPRESSION: Interval retraction of the right-sided PICC with tip now at the cavoatrial junction. Electronically Signed   By: Anner Crete M.D.   On: 02/06/2015 23:13   Dg Chest 1 View  02/06/2015  CLINICAL DATA:  PICC placement EXAM: CHEST 1 VIEW COMPARISON:  05/09/2013 FINDINGS: Right upper extremity PICC, tip at the low right atrium.  This is already been corrected at time of dictation. Limited evaluation of nasogastric tube tip due to underpenetration the level of the upper abdomen. There is no edema, consolidation, effusion, or pneumothorax. Normal heart size and stable mediastinal contours. IMPRESSION: 1. Right upper extremity PICC with tip at the low right atrium, already corrected at time of dictation. 2.  Limited assessment of nasogastric tube positioning due to technical factors. Electronically Signed   By: Monte Fantasia M.D.   On: 02/06/2015 23:12   Nm Pulmonary Perf And Vent  02/08/2015  CLINICAL DATA:  Hernia surgery, tachycardia EXAM: NUCLEAR MEDICINE VENTILATION - PERFUSION LUNG SCAN TECHNIQUE: Ventilation images were obtained in multiple projections using inhaled aerosol Tc-56m DTPA. Perfusion images were obtained in multiple projections after intravenous injection of Tc-34m MAA. RADIOPHARMACEUTICALS:  [id number] millicuries [id number] DTPA aerosol inhalation and [id number] millicuries [id number] MAA IV COMPARISON:  Chest x-ray 02/06/2015 FINDINGS: Ventilation: No segmental ventilation defects. There is some clumping of the tracer with central airways deposition. Perfusion: No wedge shaped peripheral perfusion defects to suggest acute pulmonary embolism. The chest x-ray is unremarkable. Findings are very low probability for pulmonary embolus. IMPRESSION: Very low probability for pulmonary embolus. Electronically Signed   By: Lahoma Crocker M.D.   On: 02/08/2015 15:00    EKG:   Orders placed or performed during the hospital encounter of 02/04/15  . ED EKG  . ED EKG  . EKG 12-Lead  . EKG 12-Lead    ASSESSMENT AND PLAN:    #1 small bowel obstruction, possible incarceration of small bowel in the periostomal hernia: - Status post surgical reduction of hernia on 12/19  #2 diabetes mellitus type 2: - hemoglobin A1c is 9.2, poor control in outpatient setting - She is currently nothing by mouth due to SBO - Continue sliding scale, stop D5 as she is taking PO  #3 hypertension: - Blood pressure now decreased. Holding hydralazine, Avapro. Providing IV fluids  #4 hyperkalemia - up again today, recheck this afternoon  #5 chronic kidney disease stage III - appreciate nephrology consultation - Renal function decreased today likely due to hypotension and IV contrast - Continue to hold nephrotoxins  including Avapro for now - continue hydration  #6 hypothyroidism -  TSH slightly elevated at 4.5, continue Synthroid  #7 hyperlipidemia - Hold statin for now  #8 tachycardia:  - appreciate cardiology consultation - suspect she is volume depleted and am replacing - agree with stopping hydralazine, continue cardizem and prn metoprolol -  Sinus tachy  All the records are reviewed and case discussed with Care Management/Social Workerr. Management plans discussed with the patient, family and they are in agreement.  CODE STATUS: Full   TOTAL TIME TAKING CARE OF THIS PATIENT: 25 minutes.  Greater than 50% of time spent in care coordination and counseling. POSSIBLE D/C IN 1-2 DAYS, DEPENDING ON CLINICAL CONDITION.   Myrtis Ser M.D on 02/08/2015 at 3:42 PM  Between 7am to 6pm - Pager - [phone]  After 6pm go to [email or web address] - password [email or web address] [facility]  [facility]  Office  [phone]  CC: Primary care physician; Adin Hector, MD

## 2015-02-08 NOTE — Care Management (Signed)
Patient had laparotomy, reduction of incarcerated parastomal hernia, repair of parastomal hernia with mesh on 12/19.  She was transferred to 2A 12/20 for tachycardia.  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  12/21- she continues with tachycardia. Cardiology says telemetry is consistent with atrial flutter.  hydralazine is being discontinued and increase does of cardizem.  Patient does have history of aortic stenosis.  Physical therapy consult is pending resolution of arrhythmia.  Started on clear liquids.  Foley is out.

## 2015-02-08 NOTE — Care Management Important Message (Signed)
Important Message  Patient Details  Name: [name] MRN: [id number] Date of Birth: May 25, 1950   Medicare Important Message Given:  Yes    [name] 02/08/2015, 2:47 PM

## 2015-02-09 ENCOUNTER — Inpatient Hospital Stay: Payer: PPO

## 2015-02-09 LAB — GLUCOSE, CAPILLARY
GLUCOSE-CAPILLARY: 178 mg/dL — AB (ref 65–99)
GLUCOSE-CAPILLARY: 136 mg/dL — AB (ref 65–99)
Glucose-Capillary: 106 mg/dL — ABNORMAL HIGH (ref 65–99)
Glucose-Capillary: 133 mg/dL — ABNORMAL HIGH (ref 65–99)

## 2015-02-09 LAB — URINALYSIS COMPLETE WITH MICROSCOPIC (ARMC ONLY)
BACTERIA UA: NONE SEEN
Bilirubin Urine: NEGATIVE
Glucose, UA: NEGATIVE mg/dL
Ketones, ur: NEGATIVE mg/dL
Leukocytes, UA: NEGATIVE
Nitrite: NEGATIVE
PH: 5 (ref 5.0–8.0)
Protein, ur: 100 mg/dL — AB
SPECIFIC GRAVITY, URINE: 1.009 (ref 1.005–1.030)

## 2015-02-09 LAB — BASIC METABOLIC PANEL
ANION GAP: 7 (ref 5–15)
ANION GAP: 6 (ref 5–15)
BUN: 29 mg/dL — AB (ref 6–20)
BUN: 32 mg/dL — AB (ref 6–20)
CALCIUM: 7.3 mg/dL — AB (ref 8.9–10.3)
CALCIUM: 6.9 mg/dL — AB (ref 8.9–10.3)
CO2: 20 mmol/L — AB (ref 22–32)
CO2: 17 mmol/L — AB (ref 22–32)
Chloride: 108 mmol/L (ref 101–111)
Chloride: 107 mmol/L (ref 101–111)
Creatinine, Ser: 3.29 mg/dL — ABNORMAL HIGH (ref 0.44–1.00)
Creatinine, Ser: 3.37 mg/dL — ABNORMAL HIGH (ref 0.44–1.00)
GFR calc Af Amer: 16 mL/min — ABNORMAL LOW (ref >60)
GFR calc Af Amer: 16 mL/min — ABNORMAL LOW (ref >60)
GFR calc non Af Amer: 13 mL/min — ABNORMAL LOW (ref >60)
GFR, EST NON AFRICAN AMERICAN: 14 mL/min — AB (ref >60)
GLUCOSE: 123 mg/dL — AB (ref 65–99)
GLUCOSE: 134 mg/dL — AB (ref 65–99)
POTASSIUM: 5 mmol/L (ref 3.5–5.1)
Potassium: 5.7 mmol/L — ABNORMAL HIGH (ref 3.5–5.1)
Sodium: 135 mmol/L (ref 135–145)
Sodium: 130 mmol/L — ABNORMAL LOW (ref 135–145)

## 2015-02-09 LAB — BLOOD GAS, ARTERIAL
ACID-BASE DEFICIT: 11.3 mmol/L — AB (ref 0.0–2.0)
Allens test (pass/fail): POSITIVE — AB
Bicarbonate: 14.9 mEq/L — ABNORMAL LOW (ref 21.0–28.0)
FIO2: 21
O2 SAT: 88.9 %
PATIENT TEMPERATURE: 37
PCO2 ART: 34 mmHg (ref 32.0–48.0)
PO2 ART: 66 mmHg — AB (ref 83.0–108.0)
pH, Arterial: 7.25 — ABNORMAL LOW (ref 7.350–7.450)

## 2015-02-09 LAB — CBC
HEMATOCRIT: 32.1 % — AB (ref 35.0–47.0)
Hemoglobin: 9.8 g/dL — ABNORMAL LOW (ref 12.0–16.0)
MCH: 25.8 pg — ABNORMAL LOW (ref 26.0–34.0)
MCHC: 30.5 g/dL — ABNORMAL LOW (ref 32.0–36.0)
MCV: 84.8 fL (ref 80.0–100.0)
Platelets: 235 10*3/uL (ref 150–440)
RBC: 3.79 MIL/uL — ABNORMAL LOW (ref 3.80–5.20)
RDW: 14.7 % — AB (ref 11.5–14.5)
WBC: 12.7 10*3/uL — AB (ref 3.6–11.0)

## 2015-02-09 LAB — POTASSIUM
POTASSIUM: 5.2 mmol/L — AB (ref 3.5–5.1)
POTASSIUM: 4.7 mmol/L (ref 3.5–5.1)
Potassium: 4.1 mmol/L (ref 3.5–5.1)

## 2015-02-09 LAB — LACTIC ACID, PLASMA: Lactic Acid, Venous: 0.8 mmol/L (ref 0.5–2.0)

## 2015-02-09 LAB — POTASSIUM, URINE RANDOM: POTASSIUM UR: 25 mmol/L

## 2015-02-09 LAB — MRSA PCR SCREENING: MRSA by PCR: POSITIVE — AB

## 2015-02-09 MED ORDER — VANCOMYCIN HCL 10 G IV SOLR
1500.0000 mg | INTRAVENOUS | Status: DC
  Administered 2015-02-11: 1500 mg via INTRAVENOUS
  Filled 2015-02-09 (×2): qty 1500

## 2015-02-09 MED ORDER — MUPIROCIN 2 % EX OINT
1.0000 "application " | TOPICAL_OINTMENT | Freq: Two times a day (BID) | CUTANEOUS | Status: DC
  Administered 2015-02-10 – 2015-02-12 (×3): 1 via NASAL
  Filled 2015-02-09 (×5): qty 22

## 2015-02-09 MED ORDER — SODIUM CHLORIDE 0.9 % IV SOLN
INTRAVENOUS | Status: DC
  Administered 2015-02-09 (×2): via INTRAVENOUS

## 2015-02-09 MED ORDER — PHENYLEPHRINE HCL 10 MG/ML IJ SOLN
0.0000 ug/min | INTRAVENOUS | Status: DC
  Administered 2015-02-09: 100 ug/min via INTRAVENOUS
  Administered 2015-02-09: 20 ug/min via INTRAVENOUS
  Administered 2015-02-09: 100 ug/min via INTRAVENOUS
  Administered 2015-02-10: 133.333 ug/min via INTRAVENOUS
  Filled 2015-02-09 (×6): qty 1

## 2015-02-09 MED ORDER — AMIODARONE LOAD VIA INFUSION
150.0000 mg | Freq: Once | INTRAVENOUS | Status: AC
  Administered 2015-02-09: 150 mg via INTRAVENOUS
  Filled 2015-02-09: qty 83.34

## 2015-02-09 MED ORDER — AMIODARONE HCL IN DEXTROSE 360-4.14 MG/200ML-% IV SOLN
60.0000 mg/h | INTRAVENOUS | Status: AC
  Administered 2015-02-09 (×2): 60 mg/h via INTRAVENOUS
  Filled 2015-02-09: qty 200

## 2015-02-09 MED ORDER — SODIUM POLYSTYRENE SULFONATE 15 GM/60ML PO SUSP
15.0000 g | Freq: Once | ORAL | Status: AC
Start: 2015-02-09 — End: 2015-02-09
  Administered 2015-02-09: 15 g via ORAL
  Filled 2015-02-09: qty 60

## 2015-02-09 MED ORDER — SODIUM CHLORIDE 0.9 % IV BOLUS (SEPSIS): 500.0000 mL | Freq: Once | INTRAVENOUS | Status: DC

## 2015-02-09 MED ORDER — INSULIN ASPART 100 UNIT/ML IV SOLN
10.0000 [IU] | Freq: Once | INTRAVENOUS | Status: AC
  Administered 2015-02-09: 10 [IU] via INTRAVENOUS
  Filled 2015-02-09: qty 0.1

## 2015-02-09 MED ORDER — DEXTROSE 50 % IV SOLN
25.0000 mL | Freq: Once | INTRAVENOUS | Status: AC
  Filled 2015-02-09: qty 50

## 2015-02-09 MED ORDER — VANCOMYCIN HCL 10 G IV SOLR
2000.0000 mg | Freq: Once | INTRAVENOUS | Status: AC
  Administered 2015-02-09: 2000 mg via INTRAVENOUS
  Filled 2015-02-09: qty 2000

## 2015-02-09 MED ORDER — METOPROLOL TARTRATE 1 MG/ML IV SOLN
5.0000 mg | Freq: Once | INTRAVENOUS | Status: AC
  Administered 2015-02-10: 5 mg via INTRAVENOUS
  Filled 2015-02-09: qty 5

## 2015-02-09 MED ORDER — NOREPINEPHRINE BITARTRATE 1 MG/ML IV SOLN
0.0000 ug/min | INTRAVENOUS | Status: DC
  Filled 2015-02-09 (×2): qty 4

## 2015-02-09 MED ORDER — DIGOXIN 0.25 MG/ML IJ SOLN
0.2500 mg | INTRAMUSCULAR | Status: DC
  Filled 2015-02-09 (×3): qty 1

## 2015-02-09 MED ORDER — DIGOXIN 125 MCG PO TABS
0.1250 mg | ORAL_TABLET | Freq: Every day | ORAL | Status: DC
  Administered 2015-02-10 – 2015-02-13 (×4): 0.125 mg via ORAL
  Filled 2015-02-09 (×4): qty 1

## 2015-02-09 MED ORDER — DIGOXIN 0.25 MG/ML IJ SOLN
0.2500 mg | INTRAMUSCULAR | Status: AC
  Administered 2015-02-09 (×2): 0.25 mg via INTRAVENOUS
  Filled 2015-02-09 (×2): qty 1
  Filled 2015-02-09: qty 2

## 2015-02-09 MED ORDER — SODIUM CHLORIDE 0.9 % IV BOLUS (SEPSIS)
500.0000 mL | Freq: Once | INTRAVENOUS | Status: AC
  Administered 2015-02-09: 500 mL via INTRAVENOUS

## 2015-02-09 MED ORDER — PIPERACILLIN-TAZOBACTAM 3.375 G IVPB
3.3750 g | Freq: Two times a day (BID) | INTRAVENOUS | Status: DC
  Filled 2015-02-09: qty 50

## 2015-02-09 MED ORDER — DEXTROSE 50 % IV SOLN
1.0000 | Freq: Once | INTRAVENOUS | Status: AC
  Administered 2015-02-09: 50 mL via INTRAVENOUS

## 2015-02-09 MED ORDER — CHLORHEXIDINE GLUCONATE CLOTH 2 % EX PADS
6.0000 | MEDICATED_PAD | Freq: Every day | CUTANEOUS | Status: AC
  Administered 2015-02-10 – 2015-02-14 (×5): 6 via TOPICAL

## 2015-02-09 MED ORDER — PIPERACILLIN-TAZOBACTAM 3.375 G IVPB
3.3750 g | Freq: Two times a day (BID) | INTRAVENOUS | Status: DC
  Administered 2015-02-10 – 2015-02-12 (×5): 3.375 g via INTRAVENOUS
  Filled 2015-02-09 (×8): qty 50

## 2015-02-09 MED ORDER — INSULIN REGULAR HUMAN 100 UNIT/ML IJ SOLN
10.0000 [IU] | Freq: Once | INTRAMUSCULAR | Status: DC
  Filled 2015-02-09: qty 0.1

## 2015-02-09 MED ORDER — AMIODARONE HCL IN DEXTROSE 360-4.14 MG/200ML-% IV SOLN
30.0000 mg/h | INTRAVENOUS | Status: DC
  Administered 2015-02-10 – 2015-02-11 (×3): 30 mg/h via INTRAVENOUS
  Filled 2015-02-09 (×7): qty 200

## 2015-02-09 NOTE — Progress Notes (Signed)
Spoke with E Link RN about patient's status, blood pressure low but neo infusing. Heart rate still fluctuating often from 110s to 150s. E Link RN to make Margaree Mackintosh MD aware.

## 2015-02-09 NOTE — Progress Notes (Signed)
Spoke with dr. Volanda Napoleon to make aware patient heart is ranging from low 100's to 120's. bp still low. Per md increase ns to 178ml/hr. No other orders received. Will continue to monitor closely

## 2015-02-09 NOTE — Progress Notes (Signed)
Spoke with dr. Clayborn Bigness and updated on patients status made md aware patient is unable to take po metoprolol or cardizem due to low bp. Current hr afib 110's to 150's. md was given current bun 32 and creat 3.37. Per md give 0.25mg  iv digoxin q4h x3 dose and then start 0.25mg  po daily tomorrow.

## 2015-02-09 NOTE — Progress Notes (Signed)
Initial Nutrition Assessment    INTERVENTION:   Meals and Snacks: Cater to patient preferences   NUTRITION DIAGNOSIS:     No nutrition diagnosis at this time  GOAL:   Patient will meet greater than or equal to 90% of their needs  MONITOR:    (Energy Intake, Anthropometrics, Digestive System)  REASON FOR ASSESSMENT:   NPO/Clear Liquid Diet, LOS    ASSESSMENT:    Pt admitted with SBO with incarcerated parastomal hernia s/p ex lap repair  Past Medical History  Diagnosis Date  . Diabetes mellitus without complication ([place])   . GI bleed   . Aortic valve stenosis   . Hyperlipidemia   . Hypertension   . Diverticulitis large intestine   . Obesity   . Shortness of breath dyspnea   . Hyperthyroidism   . Anxiety   . Arthritis   . Neuropathy in diabetes ([place])   . Anemia   . Vertigo   . Bowel obstruction ([place])      Diet Order:  Diet regular Room service appropriate?: Yes; Fluid consistency:: Thin   Energy Intake: diet just advanced to Regular today, pt tolerating thus far. Previously NPO/CL  Food and nutrition related history: pt reports good appetite prior to admission  Skin:  Reviewed, no issues  Last BM:  12/22   Electrolyte and Renal Profile:  Recent Labs Lab 02/05/15 0637  02/08/15 1555 02/09/15 0536 02/09/15 0906 02/09/15 1243  BUN 27*  < > 24* 29*  --  32*  CREATININE 2.26*  < > 2.80* 3.29*  --  3.37*  NA 138  < > 134* 135  --  130*  K 4.6  < > 5.1 5.7* 5.2* 5.0  MG 2.1  --   --   --   --   --   PHOS 4.7*  --   --   --   --   --   < > = values in this interval not displayed. Glucose Profile:  Recent Labs  02/09/15 0813 02/09/15 0951 02/09/15 1116  GLUCAP 106* 178* 136*   Meds: NS at 125 ml/hr, ss novolog, novolog  Height:   Ht Readings from Last 1 Encounters:  02/06/15 5\' 3"  (1.6 m)    Weight: pt reports weight goes up and down but overall stays the same  Wt Readings from Last 1 Encounters:  02/09/15 231 lb (104.781 kg)     BMI:  Body mass index is 40.93 kg/(m^2).  LOW Care Level  Kerman Passey MS, [place], LDN [phone] Pager  [phone] Weekend/On-Call Pager

## 2015-02-09 NOTE — Consult Note (Signed)
ANTIBIOTIC CONSULT NOTE - INITIAL  Pharmacy Consult for vancomycin Indication: rule out sepsis  No Known Allergies  Patient Measurements: Height: 5\' 3"  (160 cm) Weight: 231 lb (104.781 kg) IBW/kg (Calculated) : 52.4 Adjusted Body Weight:   Vital Signs: Temp: 97.5 F (36.4 C) (12/22 1116) BP: 99/76 mmHg (12/22 1502) Pulse Rate: 82 (12/22 1116) Intake/Output from previous day: 12/21 0701 - 12/22 0700 In: 2040 [P.O.:840; I.V.:1200] Out: 510 [Urine:500; Drains:10] Intake/Output from this shift: Total I/O In: 740 [P.O.:240; IV Piggyback:500] Out: 150 [Urine:150]  Labs:  Recent Labs  02/07/15 1759 02/08/15 0510 02/08/15 1555 02/09/15 0536 02/09/15 1243  WBC 14.9* 12.5*  --  12.7*  --   HGB 11.6* 10.8*  --  9.8*  --   PLT 218 225  --  235  --   CREATININE  --  2.57* 2.80* 3.29* 3.37*   Estimated Creatinine Clearance: 19.5 mL/min (by C-G formula based on Cr of 3.37). No results for input(s): VANCOTROUGH, VANCOPEAK, VANCORANDOM, GENTTROUGH, GENTPEAK, GENTRANDOM, TOBRATROUGH, TOBRAPEAK, TOBRARND, AMIKACINPEAK, AMIKACINTROU, AMIKACIN in the last 72 hours.   Microbiology: No results found for this or any previous visit (from the past 720 hour(s)).  Medical History: Past Medical History  Diagnosis Date  . Diabetes mellitus without complication ([place])   . GI bleed   . Aortic valve stenosis   . Hyperlipidemia   . Hypertension   . Diverticulitis large intestine   . Obesity   . Shortness of breath dyspnea   . Hyperthyroidism   . Anxiety   . Arthritis   . Neuropathy in diabetes ([place])   . Anemia   . Vertigo   . Bowel obstruction (HCC)     Medications:  Scheduled:  . digoxin  0.25 mg Intravenous Q4H  . [START ON 02/10/2015] digoxin  0.125 mg Oral Daily  . DULoxetine  30 mg Oral Daily  . enoxaparin (LOVENOX) injection  30 mg Subcutaneous Q24H  . insulin aspart  0-5 Units Subcutaneous QHS  . insulin aspart  0-9 Units Subcutaneous TID WC  . levothyroxine  100 mcg  Oral QAC breakfast  . metoprolol tartrate  50 mg Oral BID  . piperacillin-tazobactam (ZOSYN)  IV  3.375 g Intravenous Q12H  . [START ON 02/11/2015] vancomycin  1,500 mg Intravenous Q48H  . vancomycin  2,000 mg Intravenous Once   Assessment: Pt is a 64 year old female s/p surgical reduction of hernia 12/19, pt with elevated HR, hypotensive, in and out of afib. Pharmacy is consulted to dose vancomycin to rule out sepsis. Pt is in AKF or CKD III.  Goal of Therapy:  resolution of infection/rule out infection  Plan:  Vancomycin 2g once. Then vancomycin 1500mg  q 48 hours. Check trough before 5th dose 1230 @1830 . Pharmacy to continue to monitor. Follow cxs  Ramond Dial 02/09/2015,5:08 PM

## 2015-02-09 NOTE — Progress Notes (Signed)
eLink Physician-Brief Progress Note Patient Name: [name] DOB: [birth year] MRN: [id number]   Date of Service  02/09/2015  HPI/Events of Note  Admitted with incarcerated small bowel. S/P laparotomy and repair 12/19. Now hypotensive, rapid afib (HR 150s).   eICU Interventions  Started on vanco, zosyn. Use neo instead of norepi given the Afib, check lactic acid. May need amio   Intervention Category Evaluation Type: New Patient Evaluation  [name] 02/09/2015, 6:01 PM

## 2015-02-09 NOTE — Progress Notes (Signed)
PT Cancellation Note  Patient Details Name: [name] MRN: [id number] DOB: 24-Aug-1950   Cancelled Treatment:    Reason Eval/Treat Not Completed: Medical issues which prohibited therapy.  Nursing recommending holding PT d/t BP and HR issues.  Will re-attempt PT eval at a later date/time.   Leitha Bleak 02/09/2015, 2:31 PM Leitha Bleak, [place]

## 2015-02-09 NOTE — Progress Notes (Signed)
Surgery  POD 4  S/P  exploratory laparotomy release of small bowel obstruction and repair of incarcerated parastomal hernia.  She is feeling much better. Her heart rate is going between normal sinus and atrial fibrillation. VQ scan performed yesterday was low probability. Pain is much better controlled. Ostomy is working. Blood pressure was somewhat low this morning and she's getting some extra IV fluids.  Filed Vitals:   02/09/15 0423 02/09/15 0631 02/09/15 0809 02/09/15 0912  BP: 82/65 84/65 81/55  106/54  Pulse: 72     Temp: 97.7 F (36.5 C)     TempSrc: Oral     Resp: 18     Height:      Weight: 231 lb (104.781 kg)     SpO2: 93%       PE:   Dressings were removed. Drain was removed.  The dressing were dry the wounds appear intact.  The mucosal surface of the ostomy demonstrates some patchy areas of hemorrhagic of mucosal injury.  There is air and liquid stool in the bag.  Extremities were warm and well-perfused.  Affect is normal.  Mentation normal.  Labs  CBC Latest Ref Rng 02/09/2015 02/08/2015 02/07/2015  WBC 3.6 - 11.0 K/uL 12.7(H) 12.5(H) 14.9(H)  Hemoglobin 12.0 - 16.0 g/dL 9.8(L) 10.8(L) 11.6(L)  Hematocrit 35.0 - 47.0 % 32.1(L) 35.2 38.0  Platelets 150 - 440 K/uL 235 225 218   CMP Latest Ref Rng 02/09/2015 02/08/2015 02/08/2015  Glucose 65 - 99 mg/dL 123(H) 159(H) 178(H)  BUN 6 - 20 mg/dL 29(H) 24(H) 23(H)  Creatinine 0.44 - 1.00 mg/dL 3.29(H) 2.80(H) 2.57(H)  Sodium 135 - 145 mmol/L 135 134(L) 135  Potassium 3.5 - 5.1 mmol/L 5.7(H) 5.1 5.6(H)  Chloride 101 - 111 mmol/L 108 107 109  CO2 22 - 32 mmol/L 20(L) 21(L) 22  Calcium 8.9 - 10.3 mg/dL 7.3(L) 7.2(L) 7.1(L)  Total Protein 6.5 - 8.1 g/dL - - -  Total Bilirubin 0.3 - 1.2 mg/dL - - -  Alkaline Phos 38 - 126 U/L - - -  AST 15 - 41 U/L - - -  ALT 14 - 54 U/L - - -    I/O last 3 completed shifts: In: 2040 [P.O.:840; I.V.:1200] Out: 53 [Urine:900; Emesis/NG output:500; Drains:15] Total I/O In: 500  [IV Piggyback:500] Out: -     IMP:  From a surgical standpoint the patient is improving. The findings of the ostomy or as what I would expect. I do not think that she needs an ostomy revision. Her creatinine continues to increase. Urine output remains okay. Nephrology is involved.  Plan:  Advance diet. Physical therapy. Consideration of inpatient rehabilitation upon discharge. Discussed in detail with the nurse present.  Cardiology and medicine follow.

## 2015-02-09 NOTE — Consult Note (Signed)
ANTIBIOTIC CONSULT NOTE - INITIAL  Pharmacy Consult for zosyn Indication: rule out sepsis  No Known Allergies  Patient Measurements: Height: 5\' 3"  (160 cm) Weight: 231 lb (104.781 kg) IBW/kg (Calculated) : 52.4 Adjusted Body Weight:   Vital Signs: Temp: 97.5 F (36.4 C) (12/22 1116) BP: 99/76 mmHg (12/22 1502) Pulse Rate: 82 (12/22 1116) Intake/Output from previous day: 12/21 0701 - 12/22 0700 In: 2040 [P.O.:840; I.V.:1200] Out: 510 [Urine:500; Drains:10] Intake/Output from this shift: Total I/O In: 740 [P.O.:240; IV Piggyback:500] Out: 150 [Urine:150]  Labs:  Recent Labs  02/07/15 1759 02/08/15 0510 02/08/15 1555 02/09/15 0536 02/09/15 1243  WBC 14.9* 12.5*  --  12.7*  --   HGB 11.6* 10.8*  --  9.8*  --   PLT 218 225  --  235  --   CREATININE  --  2.57* 2.80* 3.29* 3.37*   Estimated Creatinine Clearance: 19.5 mL/min (by C-G formula based on Cr of 3.37). No results for input(s): VANCOTROUGH, VANCOPEAK, VANCORANDOM, GENTTROUGH, GENTPEAK, GENTRANDOM, TOBRATROUGH, TOBRAPEAK, TOBRARND, AMIKACINPEAK, AMIKACINTROU, AMIKACIN in the last 72 hours.   Microbiology: No results found for this or any previous visit (from the past 720 hour(s)).  Medical History: Past Medical History  Diagnosis Date  . Diabetes mellitus without complication ([place])   . GI bleed   . Aortic valve stenosis   . Hyperlipidemia   . Hypertension   . Diverticulitis large intestine   . Obesity   . Shortness of breath dyspnea   . Hyperthyroidism   . Anxiety   . Arthritis   . Neuropathy in diabetes ([place])   . Anemia   . Vertigo   . Bowel obstruction (HCC)     Medications:  Scheduled:  . digoxin  0.25 mg Intravenous Q4H  . [START ON 02/10/2015] digoxin  0.125 mg Oral Daily  . DULoxetine  30 mg Oral Daily  . enoxaparin (LOVENOX) injection  30 mg Subcutaneous Q24H  . insulin aspart  0-5 Units Subcutaneous QHS  . insulin aspart  0-9 Units Subcutaneous TID WC  . levothyroxine  100 mcg Oral  QAC breakfast  . metoprolol tartrate  50 mg Oral BID  . piperacillin-tazobactam (ZOSYN)  IV  3.375 g Intravenous Q12H   Assessment: Pt is a 64 year old female s/p surgical reduction of hernia 12/19, pt with elevated HR, hypotensive, in and out of afib. Pharmacy is consulted to dose zosyn to rule out sepsis. Pt is in AKF or CKD III.  Goal of Therapy:  resolution of infection/rule out infection  Plan:  Follow up culture results Will give zosyn 3.375g q 12 hours. pharmacy to continue to monitor  Ramond Dial 02/09/2015,4:53 PM

## 2015-02-09 NOTE — Progress Notes (Addendum)
Spoke with dr. Volanda Napoleon to make aware patients blood pressure remains lows. Heart rate ranging from 110's t0 150's however staying in 150's for longer periods. Patient drowsy. Poor output this shift. Per md will transfer to the unit. Also called and spoke with dr. Juliette Mangle nurse to make aware patients current status, and dr. Volanda Napoleon plan to transfer patient to the unit

## 2015-02-09 NOTE — Progress Notes (Signed)
eLink Physician-Brief Progress Note Patient Name: TALIKA LORING DOB: 1950/03/06 MRN: [id number]   Date of Service  02/09/2015  HPI/Events of Note  Remains tachycardic, currently to 140's. Both phenylephrine and amiodarone infusing.   eICU Interventions  Single dose metoprolol IV now.  Will follow     Intervention Category Major Interventions: Arrhythmia - evaluation and management  Petrita Blunck S. 02/09/2015, 11:44 PM

## 2015-02-09 NOTE — Progress Notes (Signed)
Informed Dr. pyreddy that patient was going in/out of sinus tach/ AFIB. Informed him that  Patient doesn't have a history of Afib. HR is currently 110-120. No new order, continue to monitor.

## 2015-02-09 NOTE — Progress Notes (Signed)
Spoke with dr. Volanda Napoleon to make aware patient current bp 91/67 hr 93. Scheduled lopressor has not been given. Per dr. Volanda Napoleon okay to hold am scheduled dose of lopressor

## 2015-02-09 NOTE — Progress Notes (Addendum)
[facility] [place] NAME: [name]    MR#:  [id number]  DATE OF BIRTH:  [birth year]  SUBJECTIVE:  CHIEF COMPLAINT:   Chief Complaint  Patient presents with  . Abdominal Pain   Denies pain. Slept well. Does have ostomy output. No complaints  REVIEW OF SYSTEMS:   Review of Systems  Constitutional: Negative for fever.  Respiratory: Negative for shortness of breath.   Cardiovascular: Negative for chest pain and palpitations.  Gastrointestinal: Positive for nausea. Negative for vomiting and abdominal pain.  Genitourinary: Negative for dysuria.    DRUG ALLERGIES:  No Known Allergies  VITALS:  Blood pressure 107/62, pulse 82, temperature 97.5 F (36.4 C), temperature source Oral, resp. rate 22, height 5\' 3"  (1.6 m), weight 104.781 kg (231 lb), SpO2 93 %.  PHYSICAL EXAMINATION:  GENERAL:  64 y.o.-year-old patient lying in the bed with no acute distress. Obese.  LUNGS: Normal breath sounds bilaterally, no wheezing, rales,rhonchi or crepitation. No use of accessory muscles of respiration.  CARDIOVASCULAR: S1, S2 normal. No murmurs, rubs, or gallops. tachycardic ABDOMEN: Soft, no abdominal tenderness, ostomy in left lower quadrant with green output, nondistended. Bowel sounds decreased.  EXTREMITIES: No pedal edema, cyanosis, or clubbing.  NEUROLOGIC: Cranial nerves II through XII are intact. Muscle strength 5/5 in all extremities. Sensation intact. Gait not checked.  PSYCHIATRIC: The patient is alert and oriented x 3.  SKIN: No obvious rash, lesion, or ulcer.    LABORATORY PANEL:   CBC  Recent Labs Lab 02/09/15 0536  WBC 12.7*  HGB 9.8*  HCT 32.1*  PLT 235   ------------------------------------------------------------------------------------------------------------------  Chemistries   Recent Labs Lab 02/04/15 0203 02/05/15 0637  02/09/15 1243  NA 138 138  < > 130*  K 5.1 4.6  < > 5.0  CL 107 106  < > 107   CO2 22 26  < > 17*  GLUCOSE 250* 184*  < > 134*  BUN 29* 27*  < > 32*  CREATININE 1.90* 2.26*  < > 3.37*  CALCIUM 8.3* 8.1*  < > 6.9*  MG  --  2.1  --   --   AST 20  --   --   --   ALT 13*  --   --   --   ALKPHOS 97  --   --   --   BILITOT 0.9  --   --   --   < > = values in this interval not displayed. ------------------------------------------------------------------------------------------------------------------  Cardiac Enzymes No results for input(s): TROPONINI in the last 168 hours. ------------------------------------------------------------------------------------------------------------------  RADIOLOGY:  Nm Pulmonary Perf And Vent  02/08/2015  CLINICAL DATA:  Hernia surgery, tachycardia EXAM: NUCLEAR MEDICINE VENTILATION - PERFUSION LUNG SCAN TECHNIQUE: Ventilation images were obtained in multiple projections using inhaled aerosol Tc-39m DTPA. Perfusion images were obtained in multiple projections after intravenous injection of Tc-61m MAA. RADIOPHARMACEUTICALS:  [id number] millicuries AB-123456789 DTPA aerosol inhalation and AB-123456789 millicuries AB-123456789 MAA IV COMPARISON:  Chest x-ray 02/06/2015 FINDINGS: Ventilation: No segmental ventilation defects. There is some clumping of the tracer with central airways deposition. Perfusion: No wedge shaped peripheral perfusion defects to suggest acute pulmonary embolism. The chest x-ray is unremarkable. Findings are very low probability for pulmonary embolus. IMPRESSION: Very low probability for pulmonary embolus. Electronically Signed   By: Lahoma Crocker M.D.   On: 02/08/2015 15:00    EKG:   Orders placed or performed during the hospital encounter of 02/04/15  . ED EKG  .  ED EKG  . EKG 12-Lead  . EKG 12-Lead    ASSESSMENT AND PLAN:    #1 small bowel obstruction, possible incarceration of small bowel in the periostomal hernia: - Status post surgical reduction of hernia on 12/19 - Improved ostomy output today  #2 diabetes  mellitus type 2: - hemoglobin A1c is 9.2, poor control in outpatient setting - Continue SSI  #3 hypotension - Blood pressure continues to be low. Holding hydralazine, Avapro.  - Providing IV fluids still net negative  #4 hyperkalemia - up again today, insulin and D50, Kayexalate given, recheck this afternoon  #5 acute kidney failure on chronic kidney disease stage III - appreciate nephrology consultation - likely due to hypotension and IV contrast - Continue to hold nephrotoxins including Avapro for now - continue hydration  #6 hypothyroidism -  TSH slightly elevated at 4.5, continue Synthroid  #7 hyperlipidemia - Hold statin for now  #8 tachycardia:  - appreciate cardiology consultation -  Sinus tachy, continue hydration  All the records are reviewed and case discussed with Care Management/Social Workerr. Management plans discussed with the patient, family and they are in agreement.  CODE STATUS: Full   TOTAL TIME TAKING CARE OF THIS PATIENT: 25 minutes.  Greater than 50% of time spent in care coordination and counseling. POSSIBLE D/C IN 1-2 DAYS, DEPENDING ON CLINICAL CONDITION.   Myrtis Ser M.D on 02/09/2015 at 2:44 PM  Between 7am to 6pm - Pager - [phone]  After 6pm go to [email or web address] - password [email or web address] [facility]  [facility]  Office  [phone]  CC: Primary care physician; Adin Hector, MD

## 2015-02-09 NOTE — Progress Notes (Signed)
Called by nurse to report HR increase again to 150, BP down to 90/76. Patient is groggy, which is new. No pain. Rhythm in and out of A-fib/sinus tachy.  Now net + after IV fluids x 2 days with worsening renal function. Will transfer to ICU for levophed and closer monitoring. Has PICC in place.  Continue hydration. Obtain blood cultures and UA.

## 2015-02-09 NOTE — Progress Notes (Signed)
Subjective:   Patient continues to feel poorly Serum creatinine is worse today It had increased to 3.29 Urine output has decreased to 500 cc heart rhythm continues to have between sinus and A. fib  Objective:  Vital signs in last 24 hours:  Temp:  [97.5 F (36.4 C)-97.7 F (36.5 C)] 97.5 F (36.4 C) (12/22 1116) Pulse Rate:  [72-154] 82 (12/22 1116) Resp:  [18-22] 22 (12/22 1116) BP: (81-109)/(51-67) 91/67 mmHg (12/22 1116) SpO2:  [93 %-95 %] 93 % (12/22 1116) Weight:  [104.781 kg (231 lb)] 104.781 kg (231 lb) (12/22 0423)  Weight change: 2.722 kg (6 lb) Filed Weights   02/07/15 0500 02/08/15 0500 02/09/15 0423  Weight: 101.47 kg (223 lb 11.2 oz) 102.059 kg (225 lb) 104.781 kg (231 lb)    Intake/Output:    Intake/Output Summary (Last 24 hours) at 02/09/15 1146 Last data filed at 02/09/15 [id number]  Gross per 24 hour  Intake   2060 ml  Output    510 ml  Net   1550 ml     Physical Exam: General: Morbidly obese lady, laying in the bed, no distress  HEENT Moist oral mucous membranes  Neck supple  Pulm/lungs Limited exam, normal respiratory effort, scattered rhonchi  CVS/Heart tachycardic  Abdomen:  Obese, colostomy in place,  Extremities: Trace to 1+ peripheral edema  Neurologic: Alert, able to follow commands  Skin: o acute rashes  Access:        Basic Metabolic Panel:   Recent Labs Lab 02/05/15 0637 02/06/15 0650 02/07/15 0804 02/08/15 0510 02/08/15 1555 02/09/15 0536 02/09/15 0906  NA 138 139 139 135 134* 135  --   K 4.6 4.7 4.8 5.6* 5.1 5.7* 5.2*  CL 106 108 112* 109 107 108  --   CO2 26 24 22 22  21* 20*  --   GLUCOSE 184* 155* 192* 178* 159* 123*  --   BUN 27* 24* 21* 23* 24* 29*  --   CREATININE 2.26* 2.17* 2.22* 2.57* 2.80* 3.29*  --   CALCIUM 8.1* 8.2* 7.6* 7.1* 7.2* 7.3*  --   MG 2.1  --   --   --   --   --   --   PHOS 4.7*  --   --   --   --   --   --      CBC:  Recent Labs Lab 02/06/15 0650 02/07/15 0804 02/07/15 1759  02/08/15 0510 02/09/15 0536  WBC 8.0 11.6* 14.9* 12.5* 12.7*  HGB 11.9* 11.5* 11.6* 10.8* 9.8*  HCT 38.3 37.4 38.0 35.2 32.1*  MCV 84.0 85.7 85.3 85.2 84.8  PLT 222 248 218 225 235      Microbiology:  No results found for this or any previous visit (from the past 720 hour(s)).  Coagulation Studies: No results for input(s): LABPROT, INR in the last 72 hours.  Urinalysis: No results for input(s): COLORURINE, LABSPEC, PHURINE, GLUCOSEU, HGBUR, BILIRUBINUR, KETONESUR, PROTEINUR, UROBILINOGEN, NITRITE, LEUKOCYTESUR in the last 72 hours.  Invalid input(s): APPERANCEUR    Imaging: Nm Pulmonary Perf And Vent  02/08/2015  CLINICAL DATA:  Hernia surgery, tachycardia EXAM: NUCLEAR MEDICINE VENTILATION - PERFUSION LUNG SCAN TECHNIQUE: Ventilation images were obtained in multiple projections using inhaled aerosol Tc-74m DTPA. Perfusion images were obtained in multiple projections after intravenous injection of Tc-44m MAA. RADIOPHARMACEUTICALS:  [id number] millicuries AB-123456789 DTPA aerosol inhalation and AB-123456789 millicuries AB-123456789 MAA IV COMPARISON:  Chest x-ray 02/06/2015 FINDINGS: Ventilation: No segmental ventilation defects. There is some clumping of the tracer  with central airways deposition. Perfusion: No wedge shaped peripheral perfusion defects to suggest acute pulmonary embolism. The chest x-ray is unremarkable. Findings are very low probability for pulmonary embolus. IMPRESSION: Very low probability for pulmonary embolus. Electronically Signed   By: Lahoma Crocker M.D.   On: 02/08/2015 15:00     Medications:   . sodium chloride 100 mL/hr at 02/09/15 0151   . DULoxetine  30 mg Oral Daily  . enoxaparin (LOVENOX) injection  30 mg Subcutaneous Q24H  . insulin aspart  0-5 Units Subcutaneous QHS  . insulin aspart  0-9 Units Subcutaneous TID WC  . levothyroxine  100 mcg Oral QAC breakfast  . metoprolol tartrate  50 mg Oral BID   acetaminophen **OR** acetaminophen, morphine injection,  ondansetron **OR** ondansetron (ZOFRAN) IV, oxyCODONE-acetaminophen  Assessment/ Plan:  64 y.o. 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 Acute renal failure on chronic kidney disease stage III Baseline creatinine appears to be 1.93/GFR 30 from August 2016 Acute renal failure is likely secondary to multiple factors including IV contrast exposure, possible ATN from concurrent events At present, urine output is noted to have decreased to 500 cc last 24 hours No nausea or vomiting reported Electrolytes and volume status is acceptable No acute indication for dialysis at present However, we will continue to monitor electrolytes and volume status on daily basis  2. Hyperkalemia Patient was previously getting IV fluids with potassium supplementation There has been stopped now I would not recommend use of Kayexalate due to recent bowel surgery If potassium continues to be a problem, shifting measures should be utilized such as insulin/D50 and IV bicarbonate   LOS: 5 [name] 12/22/201611:46 AM

## 2015-02-09 NOTE — Progress Notes (Signed)
Report called to sara in icu. Patient status gone over with nurse. Patient currently off the floor for [place] of kidney, called and spoke with orderly to make aware patient is to be transferred to icu room 4. Also spoke with patients daughter over the phone and gave update on patients status.

## 2015-02-09 NOTE — Progress Notes (Signed)
Inpatient Diabetes Program Recommendations  AACE/ADA: New Consensus Statement on Inpatient Glycemic Control (2015)  Target Ranges:  Prepandial:   less than 140 mg/dL      Peak postprandial:   less than 180 mg/dL (1-2 hours)      Critically ill patients:  140 - 180 mg/dL   Please change diet to carb modified/heart healthy diet.   Gentry Fitz, RN, BA, MHA, CDE Diabetes Coordinator Inpatient Diabetes Program  [phone] (Team Pager) [phone] ([place]) 02/09/2015 12:34 PM

## 2015-02-10 ENCOUNTER — Inpatient Hospital Stay: Payer: PPO

## 2015-02-10 LAB — POTASSIUM
POTASSIUM: 4 mmol/L (ref 3.5–5.1)
Potassium: 4.1 mmol/L (ref 3.5–5.1)

## 2015-02-10 LAB — GLUCOSE, CAPILLARY
GLUCOSE-CAPILLARY: 162 mg/dL — AB (ref 65–99)
GLUCOSE-CAPILLARY: 137 mg/dL — AB (ref 65–99)
Glucose-Capillary: 130 mg/dL — ABNORMAL HIGH (ref 65–99)
Glucose-Capillary: 119 mg/dL — ABNORMAL HIGH (ref 65–99)

## 2015-02-10 LAB — BLOOD GAS, ARTERIAL
ALLENS TEST (PASS/FAIL): POSITIVE — AB
Acid-base deficit: 12 mmol/L — ABNORMAL HIGH (ref 0.0–2.0)
Bicarbonate: 13.9 mEq/L — ABNORMAL LOW (ref 21.0–28.0)
FIO2: 21
O2 Saturation: 88.7 %
PATIENT TEMPERATURE: 37
PH ART: 7.26 — AB (ref 7.350–7.450)
pCO2 arterial: 31 mmHg — ABNORMAL LOW (ref 32.0–48.0)
pO2, Arterial: 65 mmHg — ABNORMAL LOW (ref 83.0–108.0)

## 2015-02-10 LAB — BASIC METABOLIC PANEL
Anion gap: 7 (ref 5–15)
BUN: 33 mg/dL — AB (ref 6–20)
CHLORIDE: 110 mmol/L (ref 101–111)
CO2: 17 mmol/L — ABNORMAL LOW (ref 22–32)
CREATININE: 3.18 mg/dL — AB (ref 0.44–1.00)
Calcium: 6.8 mg/dL — ABNORMAL LOW (ref 8.9–10.3)
GFR calc Af Amer: 17 mL/min — ABNORMAL LOW (ref >60)
GFR, EST NON AFRICAN AMERICAN: 14 mL/min — AB (ref >60)
Glucose, Bld: 140 mg/dL — ABNORMAL HIGH (ref 65–99)
Potassium: 4.1 mmol/L (ref 3.5–5.1)
SODIUM: 134 mmol/L — AB (ref 135–145)

## 2015-02-10 LAB — CBC
HCT: 29.7 % — ABNORMAL LOW (ref 35.0–47.0)
Hemoglobin: 9.3 g/dL — ABNORMAL LOW (ref 12.0–16.0)
MCH: 26.1 pg (ref 26.0–34.0)
MCHC: 31.4 g/dL — AB (ref 32.0–36.0)
MCV: 83.1 fL (ref 80.0–100.0)
PLATELETS: 225 10*3/uL (ref 150–440)
RBC: 3.57 MIL/uL — ABNORMAL LOW (ref 3.80–5.20)
RDW: 14.7 % — ABNORMAL HIGH (ref 11.5–14.5)
WBC: 11.1 10*3/uL — ABNORMAL HIGH (ref 3.6–11.0)

## 2015-02-10 LAB — LACTIC ACID, PLASMA: Lactic Acid, Venous: 0.7 mmol/L (ref 0.5–2.0)

## 2015-02-10 LAB — ALBUMIN: ALBUMIN: 2 g/dL — AB (ref 3.5–5.0)

## 2015-02-10 MED ORDER — OXYCODONE-ACETAMINOPHEN 5-325 MG PO TABS
1.0000 | ORAL_TABLET | Freq: Four times a day (QID) | ORAL | Status: DC | PRN
  Administered 2015-02-12 – 2015-02-15 (×12): 1 via ORAL
  Filled 2015-02-10 (×12): qty 1

## 2015-02-10 MED ORDER — MORPHINE SULFATE (PF) 2 MG/ML IV SOLN
2.0000 mg | INTRAVENOUS | Status: DC | PRN
  Administered 2015-02-10 – 2015-02-12 (×8): 2 mg via INTRAVENOUS
  Filled 2015-02-10 (×8): qty 1

## 2015-02-10 MED ORDER — PHENYLEPHRINE HCL 10 MG/ML IJ SOLN
0.0000 ug/min | INTRAVENOUS | Status: DC
  Administered 2015-02-10: 133.333 ug/min via INTRAVENOUS
  Filled 2015-02-10: qty 4

## 2015-02-10 MED ORDER — STERILE WATER FOR INJECTION IV SOLN
INTRAVENOUS | Status: DC
  Administered 2015-02-10 – 2015-02-12 (×2): via INTRAVENOUS
  Filled 2015-02-10 (×7): qty 850

## 2015-02-10 MED ORDER — IOHEXOL 240 MG/ML SOLN
25.0000 mL | INTRAMUSCULAR | Status: AC
  Administered 2015-02-10 (×2): 25 mL via ORAL

## 2015-02-10 MED ORDER — ENOXAPARIN SODIUM 40 MG/0.4ML ~~LOC~~ SOLN
40.0000 mg | SUBCUTANEOUS | Status: DC
  Administered 2015-02-10: 40 mg via SUBCUTANEOUS
  Filled 2015-02-10: qty 0.4

## 2015-02-10 NOTE — Care Management (Signed)
Patient was transferred to icu due to hypotension, and continued tachycardia. On amiodarone and levophed drip.  Cultures pending

## 2015-02-10 NOTE — Progress Notes (Signed)
PT Cancellation Note  Patient Details Name: [name] MRN: [id number] DOB: 1950-12-05   Cancelled Treatment:    Reason Eval/Treat Not Completed: Medical issues which prohibited therapy (Chart reviewed for attempted evaluation.  Patient noted with transfer to CCU for tachycardia and hypotension; continues with labile HR.  Will hold at this time and re-attempt in PM as patient available as medically appropriate.)  [name] H. Owens Shark, PT, DPT, NCS 02/10/2015, 8:35 AM [phone]

## 2015-02-10 NOTE — Progress Notes (Signed)
Spoke with Dr. Marina Gravel, surgeon, and MD stated he was planning to order a CT with oral contrast. Spoke to Dr. Volanda Napoleon on the phone about patient's ABG results, surgeon's plan for CT with oral contrast, and no cardiology consult formally ordered with patient's afib rvr, though cardiologist has seen patient this admission. Dr. Volanda Napoleon ordered a cardiology consult and to discontinue patient's normal saline and start sodium bicarbonate at the same rate.

## 2015-02-10 NOTE — Progress Notes (Signed)
Spoke with Dr. Marina Gravel on the phone about new sanguineous drainage from bottom of patient's abdominal incision. Patient had been repositioned in bed and begun drinking oral contrast for CT before moderate oozing from site occurred. Dressing changed. MD acknowledged and RN will continue to monitor.

## 2015-02-10 NOTE — Progress Notes (Signed)
Subjective:   Patient continues to feel poorly Serum creatinine is about the same at 3.18 Urine output recorded at 1185 cc   Objective:  Vital signs in last 24 hours:  Temp:  [97.2 F (36.2 C)-98.5 F (36.9 C)] 97.2 F (36.2 C) (12/23 1236) Pulse Rate:  [46-153] 60 (12/23 1236) Resp:  [13-31] 18 (12/23 1236) BP: (40-156)/(17-115) 99/65 mmHg (12/23 1200) SpO2:  [88 %-98 %] 98 % (12/23 1236)  Weight change:  Filed Weights   02/07/15 0500 02/08/15 0500 02/09/15 0423  Weight: 101.47 kg (223 lb 11.2 oz) 102.059 kg (225 lb) 104.781 kg (231 lb)    Intake/Output:    Intake/Output Summary (Last 24 hours) at 02/10/15 1437 Last data filed at 02/10/15 1356  Gross per 24 hour  Intake 3869.46 ml  Output   1635 ml  Net 2234.46 ml     Physical Exam: General: Morbidly obese lady, laying in the bed, no distress  HEENT Moist oral mucous membranes  Neck supple  Pulm/lungs Limited exam, normal respiratory effort, scattered rhonchi  CVS/Heart tachycardic  Abdomen:  Obese, colostomy in place,  Extremities: Trace to 1+ peripheral edema  Neurologic: Alert, able to follow commands  Skin: no acute rashes  Access:        Basic Metabolic Panel:   Recent Labs Lab 02/05/15 0637  02/08/15 0510 02/08/15 1555 02/09/15 0536  02/09/15 1243 02/09/15 1734 02/09/15 2210 02/10/15 0505 02/10/15 0723  NA 138  < > 135 134* 135  --  130*  --   --  134*  --   K 4.6  < > 5.6* 5.1 5.7*  < > 5.0 4.7 4.1 4.1  4.1 4.0  CL 106  < > 109 107 108  --  107  --   --  110  --   CO2 26  < > 22 21* 20*  --  17*  --   --  17*  --   GLUCOSE 184*  < > 178* 159* 123*  --  134*  --   --  140*  --   BUN 27*  < > 23* 24* 29*  --  32*  --   --  33*  --   CREATININE 2.26*  < > 2.57* 2.80* 3.29*  --  3.37*  --   --  3.18*  --   CALCIUM 8.1*  < > 7.1* 7.2* 7.3*  --  6.9*  --   --  6.8*  --   MG 2.1  --   --   --   --   --   --   --   --   --   --   PHOS 4.7*  --   --   --   --   --   --   --   --   --   --    < > = values in this interval not displayed.   CBC:  Recent Labs Lab 02/07/15 0804 02/07/15 1759 02/08/15 0510 02/09/15 0536 02/10/15 0505  WBC 11.6* 14.9* 12.5* 12.7* 11.1*  HGB 11.5* 11.6* 10.8* 9.8* 9.3*  HCT 37.4 38.0 35.2 32.1* 29.7*  MCV 85.7 85.3 85.2 84.8 83.1  PLT 248 218 225 235 225      Microbiology:  Recent Results (from the past 720 hour(s))  Culture, blood (Routine X 2) w Reflex to ID Panel     Status: None (Preliminary result)   Collection Time: 02/09/15  5:34 PM  Result Value Ref  Range Status   Specimen Description BLOOD LEFT ASSIST CONTROL  Final   Special Requests BOTTLES DRAWN AEROBIC AND ANAEROBIC 1CC  Final   Culture NO GROWTH < 12 HOURS  Final   Report Status PENDING  Incomplete  Culture, blood (Routine X 2) w Reflex to ID Panel     Status: None (Preliminary result)   Collection Time: 02/09/15  5:35 PM  Result Value Ref Range Status   Specimen Description BLOOD RIGHT HAND  Final   Special Requests BOTTLES DRAWN AEROBIC AND ANAEROBIC  1CC  Final   Culture NO GROWTH < 12 HOURS  Final   Report Status PENDING  Incomplete  MRSA PCR Screening     Status: Abnormal   Collection Time: 02/09/15  5:56 PM  Result Value Ref Range Status   MRSA by PCR POSITIVE (A) NEGATIVE Final    Comment: CRITICAL RESULT CALLED TO, READ BACK BY AND VERIFIED WITH: DEL HOPKINS @ 1936 ON 02/09/2015 BY CAF        The GeneXpert MRSA Assay (FDA approved for NASAL specimens only), is one component of a comprehensive MRSA colonization surveillance program. It is not intended to diagnose MRSA infection nor to guide or monitor treatment for MRSA infections.     Coagulation Studies: No results for input(s): LABPROT, INR in the last 72 hours.  Urinalysis:  Recent Labs  02/09/15 [place] 1.009  PHURINE 5.0  GLUCOSEU NEGATIVE  HGBUR 1+*  BILIRUBINUR NEGATIVE  KETONESUR NEGATIVE  PROTEINUR 100*  NITRITE NEGATIVE  LEUKOCYTESUR NEGATIVE       Imaging: Ct Abdomen Pelvis Wo Contrast  02/10/2015  CLINICAL DATA:  64 year old female inpatient with a history of distal colectomy and end colostomy on 05/10/2013 for diverticular disease admitted on 02/04/2015 for small bowel obstruction at the site of an incarcerated large parastomal hernia, status post laparotomy with reduction and mesh patch repair of parastomal hernia on 02/06/2015, with persistent abdominal pain and acidosis. EXAM: CT ABDOMEN AND PELVIS WITHOUT CONTRAST TECHNIQUE: Multidetector CT imaging of the abdomen and pelvis was performed following the standard protocol without IV contrast. COMPARISON:  02/04/2015 CT abdomen/ pelvis. 02/09/2015 renal sonogram. FINDINGS: Lower chest: No significant pulmonary nodules or acute consolidative airspace disease. Coronary atherosclerosis. Trace right greater than left bilateral pleural effusions with associated mild passive dependent lower lobe atelectasis bilaterally. There is a small oral contrast fluid level in the lower thoracic esophagus. Hepatobiliary: Mild diffuse hepatic steatosis. No liver mass. Status post cholecystectomy. No biliary ductal dilatation. Pancreas: Normal, with no mass or duct dilation. Spleen: Normal size. No mass. Adrenals/Urinary Tract: Normal adrenals. Normal kidneys with no nephrolithiasis, no hydronephrosis and no contour-deforming renal mass. Moderately distended and otherwise normal bladder. Stomach/Bowel: Grossly normal stomach. There is mild-to-moderate diffuse dilatation of the jejunum with air-fluid levels, with a gradual transition to normal caliber ileum. Fluid levels are noted throughout the normal caliber ileum. No small bowel wall thickening or pneumatosis. No appreciable focal small bowel caliber transition. Favor postoperative adynamic ileus of the small bowel. Normal appendix. There is moderate dilatation of the cecum with air-fluid level. There is mild dilatation of the fluid filled ascending colon, hepatic  flexure of the colon and proximal transverse colon. Re- demonstrated are postsurgical changes from subtotal left hemicolectomy with end colostomy in the left ventral abdominal wall. The superficial portion of the end colostomy is collapsed. Oral contrast progresses to the end colostomy. Findings favor postoperative adynamic ileus of the proximal colon. The cecal diameter is 8.0  cm. No large bowel wall thickening. Moderate diverticulosis of the remnant colon. There has been interval reduction of the left parastomal hernia, with no appreciable recurrent parastomal hernia. No small bowel loops herniating into the ventral left abdominal wall. There is a small amount of expected subcutaneous gas in the ventral left abdominal wall surrounding the end colostomy site. There is ill-defined subcutaneous fluid surrounding the left margin of the end colostomy site, with no measurable drainable fluid collection. There are skin staples in the ventral left abdominal wall adjacent to the colostomy site. Vascular/Lymphatic: Atherosclerotic nonaneurysmal abdominal aorta. No pathologically enlarged lymph nodes in the abdomen or pelvis. Reproductive: Status post hysterectomy, with no abnormal findings at the vaginal cuff. No adnexal mass. Other: No pneumoperitoneum, ascites or focal fluid collection. Musculoskeletal: No aggressive appearing focal osseous lesions. Mild-to-moderate degenerative changes in the visualized thoracolumbar spine. Mild anasarca. IMPRESSION: 1. No evidence of recurrent parastomal hernia. Ill-defined subcutaneous fluid and gas surrounding the end colostomy site, with no measurable drainable focal fluid collection. 2. Diffuse dilatation of and air-fluid levels throughout the jejunum and proximal colon, favor a moderate postoperative adynamic ileus. Cecal diameter 8.0 cm. 3. No bowel wall thickening, pneumatosis or pneumoperitoneum to suggest bowel ischemia or perforation. No intra-abdominal fluid collections. 4.  Trace bilateral pleural effusions, right greater than left. Mild anasarca. 5. Moderately distended bladder. No hydronephrosis. No bladder wall thickening. Please correlate clinically to exclude bladder voiding dysfunction. 6. Chronic findings including coronary atherosclerosis, esophageal dysmotility and/or gastroesophageal reflux, diffuse hepatic steatosis and colonic diverticulosis. Electronically Signed   By: Ilona Sorrel M.D.   On: 02/10/2015 13:51   US Renal  02/09/2015  CLINICAL DATA:  Acute renal failure. EXAM: RENAL / URINARY TRACT ULTRASOUND COMPLETE COMPARISON:  CT 02/04/2015 FINDINGS: Right Kidney: Length: 9.2 cm. Slight increased cortical echogenicity. Normal cortical thickness. No mass or hydronephrosis visualized. Left Kidney: Length: 8.5 cm. Slight increased cortical echogenicity with normal cortical thickness. No mass or hydronephrosis visualized. Bladder: Appears normal for degree of bladder distention. IMPRESSION: Normal size kidneys without evidence of hydronephrosis. Subtle increased cortical echogenicity bilaterally which can be seen with medical renal disease. Electronically Signed   By: Marin Olp M.D.   On: 02/09/2015 17:02   Nm Pulmonary Perf And Vent  02/08/2015  CLINICAL DATA:  Hernia surgery, tachycardia EXAM: NUCLEAR MEDICINE VENTILATION - PERFUSION LUNG SCAN TECHNIQUE: Ventilation images were obtained in multiple projections using inhaled aerosol Tc-53m DTPA. Perfusion images were obtained in multiple projections after intravenous injection of Tc-5m MAA. RADIOPHARMACEUTICALS:  [id number] millicuries [id number] DTPA aerosol inhalation and [id number] millicuries [id number] MAA IV COMPARISON:  Chest x-ray 02/06/2015 FINDINGS: Ventilation: No segmental ventilation defects. There is some clumping of the tracer with central airways deposition. Perfusion: No wedge shaped peripheral perfusion defects to suggest acute pulmonary embolism. The chest x-ray is unremarkable. Findings are very  low probability for pulmonary embolus. IMPRESSION: Very low probability for pulmonary embolus. Electronically Signed   By: Lahoma Crocker M.D.   On: 02/08/2015 15:00     Medications:   . amiodarone 30 mg/hr (02/10/15 0700)  . norepinephrine (LEVOPHED) Adult infusion    . phenylephrine (NEO-SYNEPHRINE) Adult infusion Stopped (02/10/15 0358)  .  sodium bicarbonate 150 mEq in sterile water 1000 mL infusion 75 mL/hr at 02/10/15 1011   . Chlorhexidine Gluconate Cloth  6 each Topical Q0600  . digoxin  0.125 mg Oral Daily  . DULoxetine  30 mg Oral Daily  . enoxaparin (LOVENOX) injection  40 mg Subcutaneous Q24H  . insulin  aspart  0-5 Units Subcutaneous QHS  . insulin aspart  0-9 Units Subcutaneous TID WC  . levothyroxine  100 mcg Oral QAC breakfast  . metoprolol tartrate  50 mg Oral BID  . mupirocin ointment  1 application Nasal BID  . piperacillin-tazobactam (ZOSYN)  IV  3.375 g Intravenous Q12H  . [START ON 02/11/2015] vancomycin  1,500 mg Intravenous Q48H   acetaminophen **OR** acetaminophen, morphine injection, ondansetron **OR** ondansetron (ZOFRAN) IV, oxyCODONE-acetaminophen  Assessment/ Plan:  64 y.o. 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 Acute renal failure on chronic kidney disease stage III Baseline creatinine appears to be 1.93/GFR 30 from August 2016 Acute renal failure is likely secondary to multiple factors including IV contrast exposure, possible ATN from concurrent events At present, urine output is 1200 cc last 24 hours  Electrolytes and volume status are acceptable No acute indication for dialysis at present However, we will continue to monitor electrolytes and volume status on  daily basis  2. Hyperkalemia Patient was previously getting IV fluids with potassium supplementation There has been stopped now  potassium is in normal range now    LOS: 6 Florene Brill 12/23/20162:37 PM

## 2015-02-10 NOTE — Progress Notes (Signed)
PT Cancellation Note  Patient Details Name: [name] MRN: [id number] DOB: November 22, 1950   Cancelled Treatment:    Reason Eval/Treat Not Completed: Medical issues which prohibited therapy (Patient continues with labile HR; RN recommends continued hold with re-attempt next date as medically appropriate.)  Malic Rosten H. Owens Shark, PT, DPT, NCS 02/10/2015, 3:34 PM [phone]

## 2015-02-10 NOTE — Progress Notes (Signed)
Patient ID: [name], female   DOB: [birth year], 64 y.o.   MRN: [id number]   POD 4  Moved to ICU for tachycardia On amiodarone gtt IM and cardiology involved.  Filed Vitals:   02/10/15 1200 02/10/15 1236 02/10/15 1300 02/10/15 1400  BP: 99/65  124/74 132/90  Pulse: 79 60 59 73  Temp:  97.2 F (36.2 C)    TempSrc:  Oral    Resp: 19 18 20 21   Height:      Weight:      SpO2: 97% 98% 98% 99%    PE ostomy functional  Appearance unchanged. Wound ok moderate drainage.  CBC Latest Ref Rng 02/10/2015 02/09/2015 02/08/2015  WBC 3.6 - 11.0 K/uL 11.1(H) 12.7(H) 12.5(H)  Hemoglobin 12.0 - 16.0 g/dL 9.3(L) 9.8(L) 10.8(L)  Hematocrit 35.0 - 47.0 % 29.7(L) 32.1(L) 35.2  Platelets 150 - 440 K/uL 225 235 225    BMP Latest Ref Rng 02/10/2015 02/10/2015 02/10/2015  Glucose 65 - 99 mg/dL - 140(H) -  BUN 6 - 20 mg/dL - 33(H) -  Creatinine 0.44 - 1.00 mg/dL - 3.18(H) -  Sodium 135 - 145 mmol/L - 134(L) -  Potassium 3.5 - 5.1 mmol/L 4.0 4.1 4.1  Chloride 101 - 111 mmol/L - 110 -  CO2 22 - 32 mmol/L - 17(L) -  Calcium 8.9 - 10.3 mg/dL - 6.8(L) -    I/O last 3 completed shifts: In: 5276.1 [P.O.:240; I.V.:3986.1; IV Piggyback:1050] Out: [id number] [Urine:1185; Drains:5] Total I/O In: 903.2 [P.O.:500; I.V.:403.2] Out: 650 [Urine:600; Stool:50]   IMP:  Persistent tachycardia. Renal insufficiency  Plan:  CT scan with no IV contrast to evaulate for abscess or leak. Doubt given clinical exam. Plan per cards/im re: tachycardia.

## 2015-02-10 NOTE — Consult Note (Signed)
ANTIBIOTIC CONSULT NOTE - INITIAL  Pharmacy Consult for vancomycin and Zosyn Indication: rule out sepsis  No Known Allergies  Patient Measurements: Height: 5\' 3"  (160 cm) Weight: 231 lb (104.781 kg) IBW/kg (Calculated) : 52.4 Adjusted Body Weight:   Vital Signs: BP: 125/83 mmHg (12/23 0900) Pulse Rate: 152 (12/23 0900) Intake/Output from previous day: 12/22 0701 - 12/23 0700 In: 4076.1 [P.O.:240; I.V.:2786.1; IV Piggyback:1050] Out: 1185 [Urine:1185] Intake/Output from this shift: Total I/O In: 293.4 [P.O.:260; I.V.:33.4] Out: 200 [Urine:150; Stool:50]  Labs:  Recent Labs  02/08/15 0510  02/09/15 0536 02/09/15 1243 02/10/15 0505  WBC 12.5*  --  12.7*  --  11.1*  HGB 10.8*  --  9.8*  --  9.3*  PLT 225  --  235  --  225  CREATININE 2.57*  < > 3.29* 3.37* 3.18*  < > = values in this interval not displayed. Estimated Creatinine Clearance: 20.7 mL/min (by C-G formula based on Cr of 3.18). No results for input(s): VANCOTROUGH, VANCOPEAK, VANCORANDOM, GENTTROUGH, GENTPEAK, GENTRANDOM, TOBRATROUGH, TOBRAPEAK, TOBRARND, AMIKACINPEAK, AMIKACINTROU, AMIKACIN in the last 72 hours.   Microbiology: Recent Results (from the past 720 hour(s))  Culture, blood (Routine X 2) w Reflex to ID Panel     Status: None (Preliminary result)   Collection Time: 02/09/15  5:34 PM  Result Value Ref Range Status   Specimen Description BLOOD LEFT ASSIST CONTROL  Final   Special Requests BOTTLES DRAWN AEROBIC AND ANAEROBIC 1CC  Final   Culture NO GROWTH < 12 HOURS  Final   Report Status PENDING  Incomplete  Culture, blood (Routine X 2) w Reflex to ID Panel     Status: None (Preliminary result)   Collection Time: 02/09/15  5:35 PM  Result Value Ref Range Status   Specimen Description BLOOD RIGHT HAND  Final   Special Requests BOTTLES DRAWN AEROBIC AND ANAEROBIC  1CC  Final   Culture NO GROWTH < 12 HOURS  Final   Report Status PENDING  Incomplete  MRSA PCR Screening     Status: Abnormal   Collection Time: 02/09/15  5:56 PM  Result Value Ref Range Status   MRSA by PCR POSITIVE (A) NEGATIVE Final    Comment: CRITICAL RESULT CALLED TO, READ BACK BY AND VERIFIED WITH: DEL HOPKINS @ 1936 ON 02/09/2015 BY CAF        The GeneXpert MRSA Assay (FDA approved for NASAL specimens only), is one component of a comprehensive MRSA colonization surveillance program. It is not intended to diagnose MRSA infection nor to guide or monitor treatment for MRSA infections.     Medical History: Past Medical History  Diagnosis Date  . Diabetes mellitus without complication ([place])   . GI bleed   . Aortic valve stenosis   . Hyperlipidemia   . Hypertension   . Diverticulitis large intestine   . Obesity   . Shortness of breath dyspnea   . Hyperthyroidism   . Anxiety   . Arthritis   . Neuropathy in diabetes ([place])   . Anemia   . Vertigo   . Bowel obstruction ([facility])     Medications:  Scheduled:  . Chlorhexidine Gluconate Cloth  6 each Topical Q0600  . digoxin  0.125 mg Oral Daily  . DULoxetine  30 mg Oral Daily  . enoxaparin (LOVENOX) injection  40 mg Subcutaneous Q24H  . insulin aspart  0-5 Units Subcutaneous QHS  . insulin aspart  0-9 Units Subcutaneous TID WC  . iohexol  25 mL Oral Q1 Hr x  2  . levothyroxine  100 mcg Oral QAC breakfast  . metoprolol tartrate  50 mg Oral BID  . mupirocin ointment  1 application Nasal BID  . piperacillin-tazobactam (ZOSYN)  IV  3.375 g Intravenous Q12H  . [START ON 02/11/2015] vancomycin  1,500 mg Intravenous Q48H   Assessment: Pt is a 64 year old female s/p surgical reduction of hernia 12/19, pt with elevated HR, hypotensive, in and out of afib. Pharmacy is consulted to dose vancomycin and Zosyn. Pt is in AKF on CKD III.  Goal of Therapy:  Vancomycin trough level 15-20 mcg/ml  Plan:  Continue vancomycin 1500mg  q 48 hours and Zosyn 3.375 g EI q 12 hours. Will check vancomycin trough before 5th dose 1230 @1830 . Pharmacy to continue to monitor.  Follow cxs  Ulice Dash D 02/10/2015,10:32 AM

## 2015-02-10 NOTE — Progress Notes (Signed)
64 y/o F on Lovenox 30 mg daily for DVT prophylaxis.   CrCl= 20.7, BMI= 41  Due to BMI > 40, will increase Lovenox dose to 40 mg daily.

## 2015-02-10 NOTE — Progress Notes (Signed)
[facility] NAME: [name]    MR#:  [id number]  DATE OF BIRTH:  [birth year]  SUBJECTIVE:  CHIEF COMPLAINT:   Chief Complaint  Patient presents with  . Abdominal Pain   Still tachycardic and hypotensive. Awake and brushing teeth this morning. Reports increasing lower abd pain.  REVIEW OF SYSTEMS:   Review of Systems  Constitutional: Negative for fever.  Respiratory: Negative for shortness of breath.   Cardiovascular: Negative for chest pain and palpitations.  Gastrointestinal: Positive for nausea. Negative for vomiting and abdominal pain.  Genitourinary: Negative for dysuria.    DRUG ALLERGIES:  No Known Allergies  VITALS:  Blood pressure 132/90, pulse 73, temperature 97.2 F (36.2 C), temperature source Oral, resp. rate 21, height 5\' 3"  (1.6 m), weight 104.781 kg (231 lb), SpO2 99 %.  PHYSICAL EXAMINATION:  GENERAL:  64 y.o.-year-old patient lying in the bed with no acute distress. Obese.  LUNGS: Normal breath sounds bilaterally, no wheezing, rales,rhonchi or crepitation. No use of accessory muscles of respiration.  CARDIOVASCULAR: S1, S2 normal. No murmurs, rubs, or gallops. tachycardic ABDOMEN: Soft, some tenderness in lower quadrants, ostomy in left lower quadrant with green/brown output, nondistended. Bowel sounds decreased.  EXTREMITIES: No pedal edema, cyanosis, or clubbing.  NEUROLOGIC: Cranial nerves II through XII are intact. Muscle strength 5/5 in all extremities. Sensation intact. Gait not checked.  PSYCHIATRIC: The patient is alert and oriented x 3.  SKIN: No obvious rash, lesion, or ulcer.    LABORATORY PANEL:   CBC  Recent Labs Lab 02/10/15 0505  WBC 11.1*  HGB 9.3*  HCT 29.7*  PLT 225   ------------------------------------------------------------------------------------------------------------------  Chemistries   Recent Labs Lab 02/04/15 0203 02/05/15 0637  02/10/15 0505  02/10/15 0723  NA 138 138  < > 134*  --   K 5.1 4.6  < > 4.1  4.1 4.0  CL 107 106  < > 110  --   CO2 22 26  < > 17*  --   GLUCOSE 250* 184*  < > 140*  --   BUN 29* 27*  < > 33*  --   CREATININE 1.90* 2.26*  < > 3.18*  --   CALCIUM 8.3* 8.1*  < > 6.8*  --   MG  --  2.1  --   --   --   AST 20  --   --   --   --   ALT 13*  --   --   --   --   ALKPHOS 97  --   --   --   --   BILITOT 0.9  --   --   --   --   < > = values in this interval not displayed. ------------------------------------------------------------------------------------------------------------------  Cardiac Enzymes No results for input(s): TROPONINI in the last 168 hours. 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 EXAM: CT ABDOMEN AND PELVIS WITHOUT CONTRAST TECHNIQUE: Multidetector CT imaging of the abdomen and pelvis was performed following the standard protocol without IV contrast. COMPARISON:  02/04/2015 CT abdomen/ pelvis. 02/09/2015 renal sonogram. FINDINGS: Lower chest: No significant  pulmonary nodules or acute consolidative airspace disease. Coronary atherosclerosis. Trace right greater than left bilateral pleural effusions with associated mild passive dependent lower lobe atelectasis bilaterally. There is a small oral contrast fluid level in the lower thoracic esophagus. Hepatobiliary: Mild diffuse hepatic steatosis. No liver mass. Status post cholecystectomy. No biliary ductal dilatation. Pancreas: Normal, with no  mass or duct dilation. Spleen: Normal size. No mass. Adrenals/Urinary Tract: Normal adrenals. Normal kidneys with no nephrolithiasis, no hydronephrosis and no contour-deforming renal mass. Moderately distended and otherwise normal bladder. Stomach/Bowel: Grossly normal stomach. There is mild-to-moderate diffuse dilatation of the jejunum with air-fluid levels, with a gradual transition to normal caliber ileum. Fluid levels are noted throughout the normal caliber ileum. No small bowel wall thickening or pneumatosis. No appreciable focal small bowel caliber transition. Favor postoperative adynamic ileus of the small bowel. Normal appendix. There is moderate dilatation of the cecum with air-fluid level. There is mild dilatation of the fluid filled ascending colon, hepatic flexure of the colon and proximal transverse colon. Re- demonstrated are postsurgical changes from subtotal left hemicolectomy with end colostomy in the left ventral abdominal wall. The superficial portion of the end colostomy is collapsed. Oral contrast progresses to the end colostomy. Findings favor postoperative adynamic ileus of the proximal colon. The cecal diameter is 8.0 cm. No large bowel wall thickening. Moderate diverticulosis of the remnant colon. There has been interval reduction of the left parastomal hernia, with no appreciable recurrent parastomal hernia. No small bowel loops herniating into the ventral left abdominal wall. There is a small amount of expected subcutaneous gas in the ventral left abdominal wall surrounding the end colostomy site. There is ill-defined subcutaneous fluid surrounding the left margin of the end colostomy site, with no measurable drainable fluid collection. There are skin staples in the ventral left abdominal wall adjacent to the colostomy site. Vascular/Lymphatic: Atherosclerotic nonaneurysmal abdominal aorta. No pathologically enlarged lymph nodes in the abdomen or pelvis. Reproductive: Status post  hysterectomy, with no abnormal findings at the vaginal cuff. No adnexal mass. Other: No pneumoperitoneum, ascites or focal fluid collection. Musculoskeletal: No aggressive appearing focal osseous lesions. Mild-to-moderate degenerative changes in the visualized thoracolumbar spine. Mild anasarca. IMPRESSION: 1. No evidence of recurrent parastomal hernia. Ill-defined subcutaneous fluid and gas surrounding the end colostomy site, with no measurable drainable focal fluid collection. 2. Diffuse dilatation of and air-fluid levels throughout the jejunum and proximal colon, favor a moderate postoperative adynamic ileus. Cecal diameter 8.0 cm. 3. No bowel wall thickening, pneumatosis or pneumoperitoneum to suggest bowel ischemia or perforation. No intra-abdominal fluid collections. 4. Trace bilateral pleural effusions, right greater than left. Mild anasarca. 5. Moderately distended bladder. No hydronephrosis. No bladder wall thickening. Please correlate clinically to exclude bladder voiding dysfunction. 6. Chronic findings including coronary atherosclerosis, esophageal dysmotility and/or gastroesophageal reflux, diffuse hepatic steatosis and colonic diverticulosis. Electronically Signed   By: Ilona Sorrel M.D.   On: 02/10/2015 13:51   US Renal  02/09/2015  CLINICAL DATA:  Acute renal failure. EXAM: RENAL / URINARY TRACT ULTRASOUND COMPLETE COMPARISON:  CT 02/04/2015 FINDINGS: Right Kidney: Length: 9.2 cm. Slight increased cortical echogenicity. Normal cortical thickness. No mass or hydronephrosis visualized. Left Kidney: Length: 8.5 cm. Slight increased cortical echogenicity with normal cortical thickness. No mass or hydronephrosis visualized. Bladder: Appears normal for degree of bladder distention. IMPRESSION: Normal size kidneys without evidence of hydronephrosis. Subtle increased cortical echogenicity bilaterally which can be seen with medical renal disease. Electronically Signed   By: Marin Olp M.D.   On:  02/09/2015 17:02    EKG:   Orders placed or performed during the hospital encounter of 02/04/15  . ED EKG  . ED EKG  . EKG 12-Lead  . EKG 12-Lead    ASSESSMENT AND PLAN:    #1 small bowel obstruction, possible incarceration of small bowel in the periostomal hernia: - Status post surgical reduction of hernia on 12/19 - continues to have small amount of ostomy output - CT without leak/obstruction. Lactate is normal  #2 diabetes mellitus type 2: - hemoglobin A1c is 9.2, poor control in outpatient setting - Continue SSI  #3 hypotension: improved - now on neosynephrine and fluids via PICC line, titrate off as possible - Holding hydralazine, Avapro.   #4 hyperkalemia: improved - continue to monitor  #5 acute kidney failure on chronic kidney disease stage III: improving - appreciate nephrology consultation - likely due to hypotension and IV contrast - Continue to hold nephrotoxins including Avapro for now - continue hydration and BP support  #6 hypothyroidism -  TSH slightly elevated at 4.5, continue Synthroid  #7 hyperlipidemia - Hold statin for now  #8 tachycardia:  - appreciate cardiology consultation, discussed with dr. Jerrye Beavers -  Continue amiodarone gtt  #9 acidosis - lactate normal - likly due to renal failure  All the records are reviewed and case discussed with Care Management/Social Workerr. Management plans discussed with the patient, family and they are in agreement.  CODE STATUS: Full   TOTAL TIME TAKING CARE OF THIS PATIENT: 35 minutes. Critical care time Greater than 50% of time spent in care coordination and counseling. POSSIBLE D/C IN 1-2 DAYS, DEPENDING ON CLINICAL CONDITION.   Myrtis Ser M.D on 02/10/2015 at 4:33 PM  Between 7am to 6pm - Pager - [phone]  After 6pm go to [email or web address] - password [email or web address] [facility]  [facility]  Office  [phone]  CC: Primary care physician; Adin Hector, MD

## 2015-02-11 ENCOUNTER — Inpatient Hospital Stay (HOSPITAL_COMMUNITY)
Admit: 2015-02-11 | Discharge: 2015-02-11 | Disposition: A | Discharge status: Home / Self Care | Payer: PPO | Attending: Internal Medicine | Admitting: Internal Medicine

## 2015-02-11 DIAGNOSIS — R0600 Dyspnea, unspecified: Secondary | ICD-10-CM

## 2015-02-11 LAB — CBC
HCT: 28.3 % — ABNORMAL LOW (ref 35.0–47.0)
Hemoglobin: 8.8 g/dL — ABNORMAL LOW (ref 12.0–16.0)
MCH: 25.4 pg — ABNORMAL LOW (ref 26.0–34.0)
MCHC: 31.2 g/dL — AB (ref 32.0–36.0)
MCV: 81.4 fL (ref 80.0–100.0)
Platelets: 249 10*3/uL (ref 150–440)
RBC: 3.47 MIL/uL — ABNORMAL LOW (ref 3.80–5.20)
RDW: 14.6 % — AB (ref 11.5–14.5)
WBC: 13.8 10*3/uL — ABNORMAL HIGH (ref 3.6–11.0)

## 2015-02-11 LAB — BASIC METABOLIC PANEL
Anion gap: 11 (ref 5–15)
BUN: 34 mg/dL — ABNORMAL HIGH (ref 6–20)
CALCIUM: 6.6 mg/dL — AB (ref 8.9–10.3)
CO2: 21 mmol/L — ABNORMAL LOW (ref 22–32)
CREATININE: 2.91 mg/dL — AB (ref 0.44–1.00)
Chloride: 101 mmol/L (ref 101–111)
GFR calc non Af Amer: 16 mL/min — ABNORMAL LOW (ref >60)
GFR, EST AFRICAN AMERICAN: 19 mL/min — AB (ref >60)
Glucose, Bld: 230 mg/dL — ABNORMAL HIGH (ref 65–99)
Potassium: 3.3 mmol/L — ABNORMAL LOW (ref 3.5–5.1)
SODIUM: 133 mmol/L — AB (ref 135–145)

## 2015-02-11 LAB — GLUCOSE, CAPILLARY
GLUCOSE-CAPILLARY: 119 mg/dL — AB (ref 65–99)
GLUCOSE-CAPILLARY: 150 mg/dL — AB (ref 65–99)
GLUCOSE-CAPILLARY: 127 mg/dL — AB (ref 65–99)
Glucose-Capillary: 158 mg/dL — ABNORMAL HIGH (ref 65–99)

## 2015-02-11 LAB — CREATININE, SERUM
Creatinine, Ser: 2.93 mg/dL — ABNORMAL HIGH (ref 0.44–1.00)
GFR, EST AFRICAN AMERICAN: 18 mL/min — AB (ref >60)
GFR, EST NON AFRICAN AMERICAN: 16 mL/min — AB (ref >60)

## 2015-02-11 MED ORDER — ENOXAPARIN SODIUM 30 MG/0.3ML ~~LOC~~ SOLN
30.0000 mg | SUBCUTANEOUS | Status: DC
  Administered 2015-02-11: 30 mg via SUBCUTANEOUS
  Filled 2015-02-11: qty 0.3

## 2015-02-11 MED ORDER — AMIODARONE HCL 200 MG PO TABS
200.0000 mg | ORAL_TABLET | Freq: Every day | ORAL | Status: DC
  Administered 2015-02-11 – 2015-02-15 (×5): 200 mg via ORAL
  Filled 2015-02-11 (×5): qty 1

## 2015-02-11 NOTE — Progress Notes (Signed)
POD 5  HR now well controlled on amio gtt  Tolerating po. Ostomy functional. PE  Wounds ok. Moderate serous drainage  CBC Latest Ref Rng 02/11/2015 02/10/2015 02/09/2015  WBC 3.6 - 11.0 K/uL 13.8(H) 11.1(H) 12.7(H)  Hemoglobin 12.0 - 16.0 g/dL 8.8(L) 9.3(L) 9.8(L)  Hematocrit 35.0 - 47.0 % 28.3(L) 29.7(L) 32.1(L)  Platelets 150 - 440 K/uL 249 225 235    BMP Latest Ref Rng 02/11/2015 02/11/2015 02/10/2015  Glucose 65 - 99 mg/dL 230(H) - -  BUN 6 - 20 mg/dL 34(H) - -  Creatinine 0.44 - 1.00 mg/dL 2.91(H) 2.93(H) -  Sodium 135 - 145 mmol/L 133(L) - -  Potassium 3.5 - 5.1 mmol/L 3.3(L) - 4.0  Chloride 101 - 111 mmol/L 101 - -  CO2 22 - 32 mmol/L 21(L) - -  Calcium 8.9 - 10.3 mg/dL 6.6(L) - -     Ct scan yesterday reviewed   IMP:  Making good progress  Plan;  Po amiodarone, diet. Doubt any benefit from abx  Will dc in next 24 hrs. If cultures remain negative,.   Mobilize and dispo planning.

## 2015-02-11 NOTE — Progress Notes (Signed)
Subjective:   Patient feels a little better today Serum creatinine slightly improved from 3.18 to 2.93 Urine output 1275 cc last 24 hours her diet has been advanced to regular    Objective:  Vital signs in last 24 hours:  Temp:  [97.2 F (36.2 C)-97.7 F (36.5 C)] 97.7 F (36.5 C) (12/23 1930) Pulse Rate:  [53-263] 84 (12/24 0800) Resp:  [16-27] 20 (12/24 0800) BP: (99-149)/(65-121) 124/102 mmHg (12/24 0800) SpO2:  [92 %-99 %] 95 % (12/24 0800) Weight:  [104.7 kg (230 lb 13.2 oz)] 104.7 kg (230 lb 13.2 oz) (12/24 0444)  Weight change:  Filed Weights   02/08/15 0500 02/09/15 0423 02/11/15 0444  Weight: 102.059 kg (225 lb) 104.781 kg (231 lb) 104.7 kg (230 lb 13.2 oz)    Intake/Output:    Intake/Output Summary (Last 24 hours) at 02/11/15 [id number] Last data filed at 02/11/15 [id number]  Gross per 24 hour  Intake 1316.55 ml  Output   1175 ml  Net 141.55 ml     Physical Exam: General: Morbidly obese lady, laying in the bed, no distress  HEENT Moist oral mucous membranes  Neck supple  Pulm/lungs Limited exam, normal respiratory effort, scattered rhonchi  CVS/Heart tachycardic  Abdomen:  Obese, colostomy in place,  Extremities: Trace to 1+ peripheral edema  Neurologic: Alert, able to follow commands  Skin: no acute rashes  Access:        Basic Metabolic Panel:   Recent Labs Lab 02/05/15 0637  02/08/15 1555 02/09/15 0536  02/09/15 1243 02/09/15 1734 02/09/15 2210 02/10/15 0505 02/10/15 0723 02/11/15 0547 02/11/15 0815  NA 138  < > 134* 135  --  130*  --   --  134*  --   --  133*  K 4.6  < > 5.1 5.7*  < > 5.0 4.7 4.1 4.1  4.1 4.0  --  3.3*  CL 106  < > 107 108  --  107  --   --  110  --   --  101  CO2 26  < > 21* 20*  --  17*  --   --  17*  --   --  21*  GLUCOSE 184*  < > 159* 123*  --  134*  --   --  140*  --   --  230*  BUN 27*  < > 24* 29*  --  32*  --   --  33*  --   --  34*  CREATININE 2.26*  < > 2.80* 3.29*  --  3.37*  --   --  3.18*  --  2.93* 2.91*   CALCIUM 8.1*  < > 7.2* 7.3*  --  6.9*  --   --  6.8*  --   --  6.6*  MG 2.1  --   --   --   --   --   --   --   --   --   --   --   PHOS 4.7*  --   --   --   --   --   --   --   --   --   --   --   < > = values in this interval not displayed.   CBC:  Recent Labs Lab 02/07/15 1759 02/08/15 0510 02/09/15 0536 02/10/15 0505 02/11/15 0815  WBC 14.9* 12.5* 12.7* 11.1* 13.8*  HGB 11.6* 10.8* 9.8* 9.3* 8.8*  HCT 38.0 35.2 32.1* 29.7* 28.3*  MCV 85.3  85.2 84.8 83.1 81.4  PLT 218 225 235 225 249      Microbiology:  Recent Results (from the past 720 hour(s))  Culture, blood (Routine X 2) w Reflex to ID Panel     Status: None (Preliminary result)   Collection Time: 02/09/15  5:34 PM  Result Value Ref Range Status   Specimen Description BLOOD LEFT ASSIST CONTROL  Final   Special Requests BOTTLES DRAWN AEROBIC AND ANAEROBIC 1CC  Final   Culture NO GROWTH 2 DAYS  Final   Report Status PENDING  Incomplete  Culture, blood (Routine X 2) w Reflex to ID Panel     Status: None (Preliminary result)   Collection Time: 02/09/15  5:35 PM  Result Value Ref Range Status   Specimen Description BLOOD RIGHT HAND  Final   Special Requests BOTTLES DRAWN AEROBIC AND ANAEROBIC  1CC  Final   Culture NO GROWTH 2 DAYS  Final   Report Status PENDING  Incomplete  MRSA PCR Screening     Status: Abnormal   Collection Time: 02/09/15  5:56 PM  Result Value Ref Range Status   MRSA by PCR POSITIVE (A) NEGATIVE Final    Comment: CRITICAL RESULT CALLED TO, READ BACK BY AND VERIFIED WITH: DEL HOPKINS @ 1936 ON 02/09/2015 BY CAF        The GeneXpert MRSA Assay (FDA approved for NASAL specimens only), is one component of a comprehensive MRSA colonization surveillance program. It is not intended to diagnose MRSA infection nor to guide or monitor treatment for MRSA infections.     Coagulation Studies: No results for input(s): LABPROT, INR in the last 72 hours.  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 EXAM: CT ABDOMEN AND PELVIS WITHOUT CONTRAST TECHNIQUE: Multidetector CT imaging of the abdomen and pelvis was performed following the standard protocol without IV contrast. COMPARISON:  02/04/2015 CT abdomen/ pelvis. 02/09/2015 renal sonogram. FINDINGS: Lower chest: No significant pulmonary nodules or acute consolidative airspace disease. Coronary atherosclerosis. Trace right greater than left bilateral pleural effusions with associated mild passive dependent lower lobe atelectasis bilaterally. There is a small oral contrast fluid level in the lower thoracic esophagus. Hepatobiliary: Mild diffuse hepatic steatosis. No liver mass. Status post cholecystectomy. No biliary ductal dilatation. Pancreas: Normal, with no mass or duct dilation. Spleen: Normal size. No mass. Adrenals/Urinary Tract: Normal adrenals. Normal kidneys with no nephrolithiasis, no hydronephrosis and no contour-deforming renal mass. Moderately distended and otherwise normal bladder. Stomach/Bowel: Grossly normal stomach. There is mild-to-moderate diffuse dilatation of the jejunum with air-fluid levels, with a gradual transition to normal caliber ileum. Fluid levels are noted throughout the normal caliber ileum. No small bowel wall thickening or pneumatosis. No appreciable focal small bowel caliber transition.  Favor postoperative adynamic ileus of the small bowel. Normal appendix. There is moderate dilatation of the cecum with air-fluid level. There is mild dilatation of the fluid filled ascending colon, hepatic flexure of  the colon and proximal transverse colon. Re- demonstrated are postsurgical changes from subtotal left hemicolectomy with end colostomy in the left ventral abdominal wall. The superficial portion of the end colostomy is collapsed. Oral contrast progresses to the end colostomy. Findings favor postoperative adynamic ileus of the proximal colon. The cecal diameter is 8.0 cm. No large bowel wall thickening. Moderate diverticulosis of the remnant colon. There has been interval reduction of the left parastomal hernia, with no appreciable recurrent parastomal hernia. No small bowel loops herniating into the ventral left abdominal wall. There is a small amount of expected subcutaneous gas in the ventral left abdominal wall surrounding the end colostomy site. There is ill-defined subcutaneous fluid surrounding the left margin of the end colostomy site, with no measurable drainable fluid collection. There are skin staples in the ventral left abdominal wall adjacent to the colostomy site. Vascular/Lymphatic: Atherosclerotic nonaneurysmal abdominal aorta. No pathologically enlarged lymph nodes in the abdomen or pelvis. Reproductive: Status post hysterectomy, with no abnormal findings at the vaginal cuff. No adnexal mass. Other: No pneumoperitoneum, ascites or focal fluid collection. Musculoskeletal: No aggressive appearing focal osseous lesions. Mild-to-moderate degenerative changes in the visualized thoracolumbar spine. Mild anasarca. IMPRESSION: 1. No evidence of recurrent parastomal hernia. Ill-defined subcutaneous fluid and gas surrounding the end colostomy site, with no measurable drainable focal fluid collection. 2. Diffuse dilatation of and air-fluid levels throughout the jejunum and proximal colon, favor a  moderate postoperative adynamic ileus. Cecal diameter 8.0 cm. 3. No bowel wall thickening, pneumatosis or pneumoperitoneum to suggest bowel ischemia or perforation. No intra-abdominal fluid collections. 4. Trace bilateral pleural effusions, right greater than left. Mild anasarca. 5. Moderately distended bladder. No hydronephrosis. No bladder wall thickening. Please correlate clinically to exclude bladder voiding dysfunction. 6. Chronic findings including coronary atherosclerosis, esophageal dysmotility and/or gastroesophageal reflux, diffuse hepatic steatosis and colonic diverticulosis. Electronically Signed   By: Ilona Sorrel M.D.   On: 02/10/2015 13:51   US Renal  02/09/2015  CLINICAL DATA:  Acute renal failure. EXAM: RENAL / URINARY TRACT ULTRASOUND COMPLETE COMPARISON:  CT 02/04/2015 FINDINGS: Right Kidney: Length: 9.2 cm. Slight increased cortical echogenicity. Normal cortical thickness. No mass or hydronephrosis visualized. Left Kidney: Length: 8.5 cm. Slight increased cortical echogenicity with normal cortical thickness. No mass or hydronephrosis visualized. Bladder: Appears normal for degree of bladder distention. IMPRESSION: Normal size kidneys without evidence of hydronephrosis. Subtle increased cortical echogenicity bilaterally which can be seen with medical renal disease. Electronically Signed   By: Marin Olp M.D.   On: 02/09/2015 17:02     Medications:   . amiodarone 30 mg/hr (02/11/15 0809)  .  sodium bicarbonate 150 mEq in sterile water 1000 mL infusion 75 mL/hr at 02/10/15 1011   . amiodarone  200 mg Oral Daily  . Chlorhexidine Gluconate Cloth  6 each Topical Q0600  . digoxin  0.125 mg Oral Daily  . DULoxetine  30 mg Oral Daily  . enoxaparin (LOVENOX) injection  30 mg Subcutaneous Q24H  . insulin aspart  0-5 Units Subcutaneous QHS  . insulin aspart  0-9 Units Subcutaneous TID WC  . levothyroxine  100 mcg Oral QAC breakfast  . metoprolol tartrate  50 mg Oral BID  . mupirocin  ointment  1 application Nasal BID  . piperacillin-tazobactam (ZOSYN)  IV  3.375 g Intravenous Q12H  . vancomycin  1,500 mg Intravenous Q48H   acetaminophen **OR** acetaminophen, morphine injection, ondansetron **OR** ondansetron (ZOFRAN) IV, oxyCODONE-acetaminophen  Assessment/ Plan:  64 y.o. female with a PMHX of diabetes  which is long-standing, GI bleed, aortic stenosis, hypertension, diverticulitis and perforated bowel in 2014 requiring colostomy diabetic neuropathy, morbid obesity, chronic kidney disease, was admitted on 02/04/2015 with incarcerated small bowel underwent laparotomy and hernia repair on December 19  1. Acute renal failure on chronic kidney disease stage III Baseline creatinine appears to be 1.93/GFR 30 from August 2016. Today 2.93 Acute renal failure is likely secondary to multiple factors including IV contrast exposure, possible ATN from concurrent events At present, urine output is ~ 1200 cc last 24 hours  Electrolytes and volume status are acceptable No acute indication for dialysis at present However, we will continue to monitor electrolytes and volume status on daily basis  2.Hypokalemia, previously Hyperkalemia Patient was previously getting IV fluids with potassium supplementation it has been stopped now  potassium is actually low Expected to improve with normal diet    LOS: 7 Wendall Isabell 12/24/20169:03 AM

## 2015-02-11 NOTE — Progress Notes (Signed)
[name] NAME: [name]    MR#:  [id number]  DATE OF BIRTH:  [birth year]  SUBJECTIVE:  CHIEF COMPLAINT:   Chief Complaint  Patient presents with  . Abdominal Pain   Doing well this am. BP improved and off neo.  HR in 90's.  Pain is controlled.  REVIEW OF SYSTEMS:   Review of Systems  Constitutional: Negative for fever.  Respiratory: Negative for shortness of breath.   Cardiovascular: Negative for chest pain and palpitations.  Gastrointestinal: Positive for nausea. Negative for vomiting and abdominal pain.  Genitourinary: Negative for dysuria.    DRUG ALLERGIES:  No Known Allergies  VITALS:  Blood pressure 111/70, pulse 85, temperature 97.7 F (36.5 C), temperature source Oral, resp. rate 17, height 5\' 3"  (1.6 m), weight 104.7 kg (230 lb 13.2 oz), SpO2 96 %.  PHYSICAL EXAMINATION:  GENERAL:  64 y.o.-year-old patient lying in the bed with no acute distress. Obese.  LUNGS: Normal breath sounds bilaterally, no wheezing, rales,rhonchi or crepitation. No use of accessory muscles of respiration.  CARDIOVASCULAR: S1, S2 normal. No murmurs, rubs, or gallops. tachycardic ABDOMEN: Soft, some tenderness in lower quadrants, ostomy in left lower quadrant with green/brown output, nondistended. Bowel sounds decreased.  EXTREMITIES: No pedal edema, cyanosis, or clubbing.  NEUROLOGIC: Cranial nerves II through XII are intact. Muscle strength 5/5 in all extremities. Sensation intact. Gait not checked.  PSYCHIATRIC: The patient is alert and oriented x 3.  SKIN: No obvious rash, lesion, or ulcer.    LABORATORY PANEL:   CBC  Recent Labs Lab 02/10/15 0505  WBC 11.1*  HGB 9.3*  HCT 29.7*  PLT 225   ------------------------------------------------------------------------------------------------------------------  Chemistries   Recent Labs Lab 02/05/15 0637  02/10/15 0505 02/10/15 0723 02/11/15 0547  NA 138  < >  134*  --   --   K 4.6  < > 4.1  4.1 4.0  --   CL 106  < > 110  --   --   CO2 26  < > 17*  --   --   GLUCOSE 184*  < > 140*  --   --   BUN 27*  < > 33*  --   --   CREATININE 2.26*  < > 3.18*  --  2.93*  CALCIUM 8.1*  < > 6.8*  --   --   MG 2.1  --   --   --   --   < > = values in this interval not displayed. ------------------------------------------------------------------------------------------------------------------  Cardiac Enzymes No results for input(s): TROPONINI in the last 168 hours. 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 EXAM: CT ABDOMEN AND PELVIS WITHOUT CONTRAST TECHNIQUE: Multidetector CT imaging of the abdomen and pelvis was performed following the standard protocol without IV contrast. COMPARISON:  02/04/2015 CT abdomen/ pelvis. 02/09/2015 renal sonogram. FINDINGS: Lower chest: No significant pulmonary nodules or acute consolidative airspace disease. Coronary atherosclerosis. Trace right greater than left bilateral pleural effusions with associated mild passive dependent lower lobe atelectasis bilaterally. There is a small oral contrast fluid level in the lower thoracic esophagus. Hepatobiliary: Mild diffuse  hepatic steatosis. No liver mass. Status post cholecystectomy. No biliary ductal dilatation. Pancreas: Normal, with no mass or duct dilation. Spleen: Normal size. No mass. Adrenals/Urinary Tract: Normal adrenals. Normal kidneys with no nephrolithiasis, no  hydronephrosis and no contour-deforming renal mass. Moderately distended and otherwise normal bladder. Stomach/Bowel: Grossly normal stomach. There is mild-to-moderate diffuse dilatation of the jejunum with air-fluid levels, with a gradual transition to normal caliber ileum. Fluid levels are noted throughout the normal caliber ileum. No small bowel wall thickening or pneumatosis. No appreciable focal small bowel caliber transition. Favor postoperative adynamic ileus of the small bowel. Normal appendix. There is moderate dilatation of the cecum with air-fluid level. There is mild dilatation of the fluid filled ascending colon, hepatic flexure of the colon and proximal transverse colon. Re- demonstrated are postsurgical changes from subtotal left hemicolectomy with end colostomy in the left ventral abdominal wall. The superficial portion of the end colostomy is collapsed. Oral contrast progresses to the end colostomy. Findings favor postoperative adynamic ileus of the proximal colon. The cecal diameter is 8.0 cm. No large bowel wall thickening. Moderate diverticulosis of the remnant colon. There has been interval reduction of the left parastomal hernia, with no appreciable recurrent parastomal hernia. No small bowel loops herniating into the ventral left abdominal wall. There is a small amount of expected subcutaneous gas in the ventral left abdominal wall surrounding the end colostomy site. There is ill-defined subcutaneous fluid surrounding the left margin of the end colostomy site, with no measurable drainable fluid collection. There are skin staples in the ventral left abdominal wall adjacent to the colostomy site. Vascular/Lymphatic: Atherosclerotic nonaneurysmal abdominal aorta. No pathologically enlarged lymph nodes in the abdomen or pelvis. Reproductive: Status post hysterectomy, with no abnormal findings at the vaginal cuff. No adnexal mass. Other: No pneumoperitoneum, ascites or focal fluid collection.  Musculoskeletal: No aggressive appearing focal osseous lesions. Mild-to-moderate degenerative changes in the visualized thoracolumbar spine. Mild anasarca. IMPRESSION: 1. No evidence of recurrent parastomal hernia. Ill-defined subcutaneous fluid and gas surrounding the end colostomy site, with no measurable drainable focal fluid collection. 2. Diffuse dilatation of and air-fluid levels throughout the jejunum and proximal colon, favor a moderate postoperative adynamic ileus. Cecal diameter 8.0 cm. 3. No bowel wall thickening, pneumatosis or pneumoperitoneum to suggest bowel ischemia or perforation. No intra-abdominal fluid collections. 4. Trace bilateral pleural effusions, right greater than left. Mild anasarca. 5. Moderately distended bladder. No hydronephrosis. No bladder wall thickening. Please correlate clinically to exclude bladder voiding dysfunction. 6. Chronic findings including coronary atherosclerosis, esophageal dysmotility and/or gastroesophageal reflux, diffuse hepatic steatosis and colonic diverticulosis. Electronically Signed   By: Ilona Sorrel M.D.   On: 02/10/2015 13:51   US Renal  02/09/2015  CLINICAL DATA:  Acute renal failure. EXAM: RENAL / URINARY TRACT ULTRASOUND COMPLETE COMPARISON:  CT 02/04/2015 FINDINGS: Right Kidney: Length: 9.2 cm. Slight increased cortical echogenicity. Normal cortical thickness. No mass or hydronephrosis visualized. Left Kidney: Length: 8.5 cm. Slight increased cortical echogenicity with normal cortical thickness. No mass or hydronephrosis visualized. Bladder: Appears normal for degree of bladder distention. IMPRESSION: Normal size kidneys without evidence of hydronephrosis. Subtle increased cortical echogenicity bilaterally which can be seen with medical renal disease. Electronically Signed   By: Marin Olp M.D.   On: 02/09/2015 17:02    EKG:   Orders placed or performed during the hospital encounter of 02/04/15  . ED EKG  . ED EKG  . EKG 12-Lead  . EKG  12-Lead    ASSESSMENT AND PLAN:    #  1 small bowel obstruction, possible incarceration of small bowel in the periostomal hernia: - Status post surgical reduction of hernia on 12/19 - continues to have small amount of ostomy output, increasing - CT without leak/obstruction. Lactate is normal.  #2 diabetes mellitus type 2: - hemoglobin A1c is 9.2, poor control in outpatient setting - Continue SSI  #3 hypotension: improved - now off neosynephrine for >12 hrs - Holding hydralazine, Avapro.  - continue IV fluids  #4 hyperkalemia: improved - continue to monitor  #5 acute kidney failure on chronic kidney disease stage III: improving - appreciate nephrology consultation - likely due to hypotension and IV contrast - Continue to hold nephrotoxins including Avapro for now - continue hydration and BP support  #6 hypothyroidism -  TSH slightly elevated at 4.5, continue Synthroid  #7 hyperlipidemia - Hold statin for now  #8 tachycardia:  - appreciate cardiology consultation, discussed with dr. Jerrye Beavers, he will see her today -  Continue amiodarone gtt, transition to oral today - transfer to tele floor.  All the records are reviewed and case discussed with Care Management/Social Workerr. Management plans discussed with the patient, family and they are in agreement.  CODE STATUS: Full   TOTAL TIME TAKING CARE OF THIS PATIENT: 35 minutes. Critical care time Greater than 50% of time spent in care coordination and counseling. POSSIBLE D/C IN 1-2 DAYS, DEPENDING ON CLINICAL CONDITION.   Myrtis Ser M.D on 02/11/2015 at 7:47 AM  Between 7am to 6pm - Pager - [phone]  After 6pm go to [email or web address] - password [email or web address] [facility]  [place] [facility]  Office  [phone]  CC: Primary care physician; Adin Hector, MD

## 2015-02-11 NOTE — Progress Notes (Signed)
Skin checked by Dorann Ou RN

## 2015-02-11 NOTE — Progress Notes (Signed)
A & O. Up to [facility]. A fib. Room air. Iso for MRSA. FS are stable. Takes meds ok. PICC to R upper arm. Midline incision to abd dry and intact. Cholestomy draining dark stool. Pt reported pain and and received Morphine. Pt has no further concerns.

## 2015-02-12 LAB — BASIC METABOLIC PANEL
Anion gap: 10 (ref 5–15)
BUN: 35 mg/dL — AB (ref 6–20)
CALCIUM: 6.6 mg/dL — AB (ref 8.9–10.3)
CO2: 25 mmol/L (ref 22–32)
Chloride: 102 mmol/L (ref 101–111)
Creatinine, Ser: 2.91 mg/dL — ABNORMAL HIGH (ref 0.44–1.00)
GFR calc Af Amer: 19 mL/min — ABNORMAL LOW (ref >60)
GFR, EST NON AFRICAN AMERICAN: 16 mL/min — AB (ref >60)
GLUCOSE: 156 mg/dL — AB (ref 65–99)
Potassium: 3 mmol/L — ABNORMAL LOW (ref 3.5–5.1)
SODIUM: 137 mmol/L (ref 135–145)

## 2015-02-12 LAB — CBC
HCT: 25.2 % — ABNORMAL LOW (ref 35.0–47.0)
Hemoglobin: 8.1 g/dL — ABNORMAL LOW (ref 12.0–16.0)
MCH: 25.9 pg — AB (ref 26.0–34.0)
MCHC: 32 g/dL (ref 32.0–36.0)
MCV: 80.9 fL (ref 80.0–100.0)
PLATELETS: 236 10*3/uL (ref 150–440)
RBC: 3.12 MIL/uL — ABNORMAL LOW (ref 3.80–5.20)
RDW: 14.5 % (ref 11.5–14.5)
WBC: 12.1 10*3/uL — AB (ref 3.6–11.0)

## 2015-02-12 LAB — GLUCOSE, CAPILLARY
Glucose-Capillary: 158 mg/dL — ABNORMAL HIGH (ref 65–99)
Glucose-Capillary: 163 mg/dL — ABNORMAL HIGH (ref 65–99)
Glucose-Capillary: 120 mg/dL — ABNORMAL HIGH (ref 65–99)

## 2015-02-12 MED ORDER — ENOXAPARIN SODIUM 40 MG/0.4ML ~~LOC~~ SOLN
40.0000 mg | SUBCUTANEOUS | Status: DC
  Administered 2015-02-13 – 2015-02-14 (×2): 40 mg via SUBCUTANEOUS
  Filled 2015-02-12 (×4): qty 0.4

## 2015-02-12 MED ORDER — MUPIROCIN CALCIUM 2 % NA OINT: TOPICAL_OINTMENT | Freq: Two times a day (BID) | NASAL | Status: DC

## 2015-02-12 MED ORDER — MUPIROCIN CALCIUM 2 % NA OINT
TOPICAL_OINTMENT | Freq: Two times a day (BID) | NASAL | Status: DC
  Administered 2015-02-13 (×2): 1 via NASAL
  Administered 2015-02-13 – 2015-02-14 (×2): via NASAL
  Administered 2015-02-14 – 2015-02-15 (×2): 1 via NASAL
  Filled 2015-02-12 (×9): qty 1

## 2015-02-12 NOTE — Progress Notes (Signed)
[place] [facility] [place] NAME: [name]    MR#:  [id number]  DATE OF BIRTH:  1950-08-01  SUBJECTIVE:  CHIEF COMPLAINT:   Chief Complaint  Patient presents with  . Abdominal Pain   Doing great this morning. Lots of stool in the ostomy bag. Blood pressure heart rate stable. No pain  REVIEW OF SYSTEMS:   Review of Systems  Constitutional: Negative for fever.  Respiratory: Negative for shortness of breath.   Cardiovascular: Negative for chest pain and palpitations.  Gastrointestinal: Positive for nausea. Negative for vomiting and abdominal pain.  Genitourinary: Negative for dysuria.    DRUG ALLERGIES:  No Known Allergies  VITALS:  Blood pressure 116/85, pulse 61, temperature 97.7 F (36.5 C), temperature source Oral, resp. rate 18, height 5\' 3"  (1.6 m), weight 108.999 kg (240 lb 4.8 oz), SpO2 97 %.  PHYSICAL EXAMINATION:  GENERAL:  64 y.o.-year-old patient lying in the bed with no acute distress. Obese.  LUNGS: Normal breath sounds bilaterally, no wheezing, rales,rhonchi or crepitation. No use of accessory muscles of respiration.  CARDIOVASCULAR: S1, S2 normal. No murmurs, rubs, or gallops. tachycardic ABDOMEN: Soft, some tenderness in lower quadrants, ostomy in left lower quadrant with green/brown output, nondistended. Bowel sounds decreased.  EXTREMITIES: No pedal edema, cyanosis, or clubbing.  NEUROLOGIC: Cranial nerves II through XII are intact. Muscle strength 5/5 in all extremities. Sensation intact. Gait not checked.  PSYCHIATRIC: The patient is alert and oriented x 3.  SKIN: No obvious rash, lesion, or ulcer.    LABORATORY PANEL:   CBC  Recent Labs Lab 02/12/15 0833  WBC 12.1*  HGB 8.1*  HCT 25.2*  PLT 236   ------------------------------------------------------------------------------------------------------------------  Chemistries   Recent Labs Lab 02/12/15 0833  NA 137  K 3.0*  CL 102  CO2 25   GLUCOSE 156*  BUN 35*  CREATININE 2.91*  CALCIUM 6.6*   ------------------------------------------------------------------------------------------------------------------  Cardiac Enzymes No results for input(s): TROPONINI in the last 168 hours. ------------------------------------------------------------------------------------------------------------------  RADIOLOGY:  No results found.  EKG:   Orders placed or performed during the hospital encounter of 02/04/15  . ED EKG  . ED EKG  . EKG 12-Lead  . EKG 12-Lead    ASSESSMENT AND PLAN:    #1 small bowel obstruction, possible incarceration of small bowel in the periostomal hernia: - Status post surgical reduction of hernia on 12/19 - In stool output today - CT without leak/obstruction. Lactate is normal.  #2 diabetes mellitus type 2: - hemoglobin A1c is 9.2, poor control in outpatient setting - Continue SSI  #3 hypotension: improved - Likely due to medication effect and decreased by mouth intake - Holding hydralazine, Avapro.  - Discontinue IV fluids, discontinue empiric antibiotics culture data is negative, this is not sepsis  #4 hyperkalemia: improved - continue to monitor  #5 acute kidney failure on chronic kidney disease stage III: improving - appreciate nephrology consultation - likely due to hypotension and IV contrast - Continue to hold nephrotoxins including Avapro for now - continue hydration and BP support  #6 hypothyroidism -  TSH slightly elevated at 4.5, continue Synthroid  #7 hyperlipidemia - Hold statin for now  #8 tachycardia: Improved - appreciate cardiology consultation, discussed with dr. Jerrye Beavers, he will see her today -  Continue amiodarone gtt, transition to oral today  9. Deconditioning - Needs physical therapy evaluation tomorrow for disposition planning  All the records are reviewed and case discussed with Care Management/Social Workerr. Management plans discussed with  the  patient, family and they are in agreement.  CODE STATUS: Full   TOTAL TIME TAKING CARE OF THIS PATIENT: 35 minutes. Critical care time Greater than 50% of time spent in care coordination and counseling. POSSIBLE D/C IN 1-2 DAYS, DEPENDING ON CLINICAL CONDITION.   Myrtis Ser M.D on 02/12/2015 at 2:25 PM  Between 7am to 6pm - Pager - [phone]  After 6pm go to [email or web address] - password [email or web address] [facility]  [place] [facility]  Office  [phone]  CC: Primary care physician; Adin Hector, MD

## 2015-02-12 NOTE — Progress Notes (Signed)
Afebrile, blood pressure stable. Urine volume adequate.  Tolerating diet.  Minimal pain.  Lungs: Clear. Approximately 97% on room air.  Cardiac: Irregular rhythm.  Abdomen: Obese, soft, nontender. Ill-defined thickening lateral to the stoma consistent with recent surgery. No warmth or erythema. Stoma functioning well.  Midline wound: Small amount of serous drainage. No evidence of erythema.  Laboratory: Mild bump in white blood cell count.  Case reviewed with Dr. Volanda Napoleon. We will discontinue antibiotics. She is receiving a large sodium load with the antibiotics and this is likely contributing to her general anasarca.  Importance of ambulation reviewed with the patient and family.

## 2015-02-12 NOTE — Progress Notes (Signed)
Subjective:   Patient feels well today Serum creatinine about the same as yesterday at 2.91 Urine output 300 cc last 24 hours + unmeasured her diet has been advanced to regular, and she is able to tolerate without nausea and vomiting    Objective:  Vital signs in last 24 hours:  Temp:  [97.8 F (36.6 C)-98.3 F (36.8 C)] 98.3 F (36.8 C) (12/25 0844) Pulse Rate:  [62-80] 62 (12/25 0844) Resp:  [16-18] 16 (12/25 0844) BP: (109-139)/(63-84) 124/84 mmHg (12/25 0844) SpO2:  [91 %-95 %] 95 % (12/25 0844) Weight:  [108.999 kg (240 lb 4.8 oz)-110.179 kg (242 lb 14.4 oz)] 108.999 kg (240 lb 4.8 oz) (12/25 0624)  Weight change: 5.479 kg (12 lb 1.3 oz) Filed Weights   02/11/15 0444 02/12/15 0500 02/12/15 0624  Weight: 104.7 kg (230 lb 13.2 oz) 110.179 kg (242 lb 14.4 oz) 108.999 kg (240 lb 4.8 oz)    Intake/Output:    Intake/Output Summary (Last 24 hours) at 02/12/15 1159 Last data filed at 02/11/15 2033  Gross per 24 hour  Intake   2650 ml  Output    300 ml  Net   2350 ml     Physical Exam: General: Morbidly obese lady, laying in the bed, no distress  HEENT Moist oral mucous membranes  Neck supple  Pulm/lungs Limited exam, normal respiratory effort, scattered rhonchi  CVS/Heart tachycardic  Abdomen:  Obese, colostomy in place,  Extremities: Trace to 1+ peripheral edema  Neurologic: Alert, able to follow commands  Skin: no acute rashes  Access:        Basic Metabolic Panel:   Recent Labs Lab 02/09/15 0536  02/09/15 1243  02/09/15 2210 02/10/15 0505 02/10/15 0723 02/11/15 0547 02/11/15 0815 02/12/15 0833  NA 135  --  130*  --   --  134*  --   --  133* 137  K 5.7*  < > 5.0  < > 4.1 4.1  4.1 4.0  --  3.3* 3.0*  CL 108  --  107  --   --  110  --   --  101 102  CO2 20*  --  17*  --   --  17*  --   --  21* 25  GLUCOSE 123*  --  134*  --   --  140*  --   --  230* 156*  BUN 29*  --  32*  --   --  33*  --   --  34* 35*  CREATININE 3.29*  --  3.37*  --   --   3.18*  --  2.93* 2.91* 2.91*  CALCIUM 7.3*  --  6.9*  --   --  6.8*  --   --  6.6* 6.6*  < > = values in this interval not displayed.   CBC:  Recent Labs Lab 02/08/15 0510 02/09/15 0536 02/10/15 0505 02/11/15 0815 02/12/15 0833  WBC 12.5* 12.7* 11.1* 13.8* 12.1*  HGB 10.8* 9.8* 9.3* 8.8* 8.1*  HCT 35.2 32.1* 29.7* 28.3* 25.2*  MCV 85.2 84.8 83.1 81.4 80.9  PLT 225 235 225 249 236      Microbiology:  Recent Results (from the past 720 hour(s))  Culture, blood (Routine X 2) w Reflex to ID Panel     Status: None (Preliminary result)   Collection Time: 02/09/15  5:34 PM  Result Value Ref Range Status   Specimen Description BLOOD LEFT ASSIST CONTROL  Final   Special Requests BOTTLES DRAWN AEROBIC AND  ANAEROBIC 1CC  Final   Culture NO GROWTH 3 DAYS  Final   Report Status PENDING  Incomplete  Culture, blood (Routine X 2) w Reflex to ID Panel     Status: None (Preliminary result)   Collection Time: 02/09/15  5:35 PM  Result Value Ref Range Status   Specimen Description BLOOD RIGHT HAND  Final   Special Requests BOTTLES DRAWN AEROBIC AND ANAEROBIC  1CC  Final   Culture NO GROWTH 3 DAYS  Final   Report Status PENDING  Incomplete  MRSA PCR Screening     Status: Abnormal   Collection Time: 02/09/15  5:56 PM  Result Value Ref Range Status   MRSA by PCR POSITIVE (A) NEGATIVE Final    Comment: CRITICAL RESULT CALLED TO, READ BACK BY AND VERIFIED WITH: DEL HOPKINS @ 1936 ON 02/09/2015 BY CAF        The GeneXpert MRSA Assay (FDA approved for NASAL specimens only), is one component of a comprehensive MRSA colonization surveillance program. It is not intended to diagnose MRSA infection nor to guide or monitor treatment for MRSA infections.     Coagulation Studies: No results for input(s): LABPROT, INR in the last 72 hours.  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 EXAM: CT ABDOMEN AND PELVIS WITHOUT CONTRAST TECHNIQUE: Multidetector CT imaging of the abdomen and pelvis was performed following the standard protocol without IV contrast. COMPARISON:  02/04/2015 CT abdomen/ pelvis. 02/09/2015 renal sonogram. FINDINGS: Lower chest: No significant pulmonary nodules or acute consolidative airspace disease. Coronary atherosclerosis. Trace right greater than left bilateral pleural effusions with associated mild passive dependent lower lobe atelectasis bilaterally. There is a small oral contrast fluid level in the lower thoracic esophagus. Hepatobiliary: Mild diffuse hepatic steatosis. No liver mass. Status post cholecystectomy. No biliary ductal dilatation. Pancreas: Normal, with no mass or duct dilation. Spleen: Normal size. No mass. Adrenals/Urinary Tract: Normal adrenals. Normal kidneys with no nephrolithiasis, no hydronephrosis and no contour-deforming renal mass. Moderately distended and otherwise normal bladder. Stomach/Bowel: Grossly normal stomach. There is mild-to-moderate diffuse dilatation of the jejunum with air-fluid levels, with a gradual transition to normal caliber ileum. Fluid levels are noted throughout the normal caliber ileum. No small bowel wall thickening or pneumatosis. No appreciable focal small bowel caliber transition. Favor postoperative adynamic ileus of the small bowel. Normal appendix. There is moderate dilatation of the cecum with  air-fluid level. There is mild dilatation of the fluid filled ascending colon, hepatic flexure of the colon and proximal transverse colon. Re- demonstrated are postsurgical changes from subtotal left hemicolectomy with end colostomy in the left ventral abdominal wall. The superficial portion of the end colostomy is collapsed. Oral contrast progresses to the end colostomy. Findings favor postoperative adynamic ileus of the proximal colon. The cecal diameter is 8.0 cm. No large bowel wall thickening. Moderate diverticulosis of the remnant colon. There has been interval reduction of the left parastomal hernia,  with no appreciable recurrent parastomal hernia. No small bowel loops herniating into the ventral left abdominal wall. There is a small amount of expected subcutaneous gas in the ventral left abdominal wall surrounding the end colostomy site. There is ill-defined subcutaneous fluid surrounding the left margin of the end colostomy site, with no measurable drainable fluid collection. There are skin staples in the ventral left abdominal wall adjacent to the colostomy site. Vascular/Lymphatic: Atherosclerotic nonaneurysmal abdominal aorta. No pathologically enlarged lymph nodes in the abdomen or pelvis. Reproductive: Status post hysterectomy, with no abnormal findings at the vaginal cuff. No adnexal mass. Other: No pneumoperitoneum, ascites or focal fluid collection. Musculoskeletal: No aggressive appearing focal osseous lesions. Mild-to-moderate degenerative changes in the visualized thoracolumbar spine. Mild anasarca. IMPRESSION: 1. No evidence of recurrent parastomal hernia. Ill-defined subcutaneous fluid and gas surrounding the end colostomy site, with no measurable drainable focal fluid collection. 2. Diffuse dilatation of and air-fluid levels throughout the jejunum and proximal colon, favor a moderate postoperative adynamic ileus. Cecal diameter 8.0 cm. 3. No bowel wall thickening, pneumatosis or  pneumoperitoneum to suggest bowel ischemia or perforation. No intra-abdominal fluid collections. 4. Trace bilateral pleural effusions, right greater than left. Mild anasarca. 5. Moderately distended bladder. No hydronephrosis. No bladder wall thickening. Please correlate clinically to exclude bladder voiding dysfunction. 6. Chronic findings including coronary atherosclerosis, esophageal dysmotility and/or gastroesophageal reflux, diffuse hepatic steatosis and colonic diverticulosis. Electronically Signed   By: Ilona Sorrel M.D.   On: 02/10/2015 13:51     Medications:   .  sodium bicarbonate 150 mEq in sterile water 1000 mL infusion 75 mL/hr at 02/12/15 0525   . amiodarone  200 mg Oral Daily  . Chlorhexidine Gluconate Cloth  6 each Topical Q0600  . digoxin  0.125 mg Oral Daily  . DULoxetine  30 mg Oral Daily  . enoxaparin (LOVENOX) injection  40 mg Subcutaneous Q24H  . insulin aspart  0-5 Units Subcutaneous QHS  . insulin aspart  0-9 Units Subcutaneous TID WC  . levothyroxine  100 mcg Oral QAC breakfast  . metoprolol tartrate  50 mg Oral BID  . mupirocin ointment  1 application Nasal BID  . piperacillin-tazobactam (ZOSYN)  IV  3.375 g Intravenous Q12H  . vancomycin  1,500 mg Intravenous Q48H   acetaminophen **OR** acetaminophen, morphine injection, ondansetron **OR** ondansetron (ZOFRAN) IV, oxyCODONE-acetaminophen  Assessment/ Plan:  64 y.o. 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 Acute renal failure on chronic kidney disease stage III Baseline creatinine appears to be 1.93/GFR 30 from August 2016. Today 2.91 Acute renal failure is likely secondary to multiple factors including IV contrast  exposure, possible ATN from concurrent events At present, Electrolytes and volume status are acceptable No acute indication for dialysis  However, we will continue to monitor electrolytes and volume status on daily basis  2.Hypokalemia, previously Hyperkalemia Patient was previously getting IV fluids with potassium supplementation it has been stopped now  potassium is actually low Expected to improve with normal diet    LOS: 8 [name] 12/25/201611:59 AM

## 2015-02-13 LAB — BASIC METABOLIC PANEL
ANION GAP: 8 (ref 5–15)
Anion gap: 8 (ref 5–15)
BUN: 33 mg/dL — AB (ref 6–20)
BUN: 30 mg/dL — AB (ref 6–20)
CHLORIDE: 103 mmol/L (ref 101–111)
CHLORIDE: 101 mmol/L (ref 101–111)
CO2: 27 mmol/L (ref 22–32)
CO2: 26 mmol/L (ref 22–32)
CREATININE: 2.65 mg/dL — AB (ref 0.44–1.00)
Calcium: 6.2 mg/dL — CL (ref 8.9–10.3)
Calcium: 6.3 mg/dL — CL (ref 8.9–10.3)
Creatinine, Ser: 2.63 mg/dL — ABNORMAL HIGH (ref 0.44–1.00)
GFR calc Af Amer: 21 mL/min — ABNORMAL LOW (ref >60)
GFR calc non Af Amer: 18 mL/min — ABNORMAL LOW (ref >60)
GFR, EST AFRICAN AMERICAN: 21 mL/min — AB (ref >60)
GFR, EST NON AFRICAN AMERICAN: 18 mL/min — AB (ref >60)
GLUCOSE: 218 mg/dL — AB (ref 65–99)
Glucose, Bld: 148 mg/dL — ABNORMAL HIGH (ref 65–99)
POTASSIUM: 3 mmol/L — AB (ref 3.5–5.1)
Potassium: 3.5 mmol/L (ref 3.5–5.1)
SODIUM: 138 mmol/L (ref 135–145)
SODIUM: 135 mmol/L (ref 135–145)

## 2015-02-13 LAB — HEPATIC FUNCTION PANEL
ALBUMIN: 1.9 g/dL — AB (ref 3.5–5.0)
ALT: 11 U/L — ABNORMAL LOW (ref 14–54)
AST: 13 U/L — ABNORMAL LOW (ref 15–41)
Alkaline Phosphatase: 87 U/L (ref 38–126)
BILIRUBIN TOTAL: 0.4 mg/dL (ref 0.3–1.2)
Bilirubin, Direct: <0.1 mg/dL — ABNORMAL LOW (ref 0.1–0.5)
TOTAL PROTEIN: 5.6 g/dL — AB (ref 6.5–8.1)

## 2015-02-13 LAB — CBC
HCT: 26.2 % — ABNORMAL LOW (ref 35.0–47.0)
HEMOGLOBIN: 8.5 g/dL — AB (ref 12.0–16.0)
MCH: 26.8 pg (ref 26.0–34.0)
MCHC: 32.6 g/dL (ref 32.0–36.0)
MCV: 82.2 fL (ref 80.0–100.0)
PLATELETS: 257 10*3/uL (ref 150–440)
RBC: 3.18 MIL/uL — AB (ref 3.80–5.20)
RDW: 14.7 % — ABNORMAL HIGH (ref 11.5–14.5)
WBC: 10.4 10*3/uL (ref 3.6–11.0)

## 2015-02-13 LAB — GLUCOSE, CAPILLARY
GLUCOSE-CAPILLARY: 115 mg/dL — AB (ref 65–99)
GLUCOSE-CAPILLARY: 152 mg/dL — AB (ref 65–99)
GLUCOSE-CAPILLARY: 156 mg/dL — AB (ref 65–99)
GLUCOSE-CAPILLARY: 157 mg/dL — AB (ref 65–99)

## 2015-02-13 MED ORDER — SODIUM CHLORIDE 0.9 % IJ SOLN
10.0000 mL | Freq: Two times a day (BID) | INTRAMUSCULAR | Status: DC
  Administered 2015-02-13 – 2015-02-15 (×4): 10 mL via INTRAVENOUS

## 2015-02-13 MED ORDER — IRBESARTAN 75 MG PO TABS
37.5000 mg | ORAL_TABLET | Freq: Every day | ORAL | Status: DC
  Administered 2015-02-13 – 2015-02-15 (×3): 37.5 mg via ORAL
  Filled 2015-02-13 (×3): qty 1

## 2015-02-13 MED ORDER — POTASSIUM CHLORIDE CRYS ER 20 MEQ PO TBCR
40.0000 meq | EXTENDED_RELEASE_TABLET | ORAL | Status: AC
  Administered 2015-02-13 (×2): 40 meq via ORAL
  Filled 2015-02-13 (×2): qty 2

## 2015-02-13 MED ORDER — DIGOXIN 125 MCG PO TABS: 0.0625 mg | ORAL_TABLET | Freq: Every day | ORAL | Status: DC

## 2015-02-13 MED ORDER — SODIUM CHLORIDE 0.9 % IJ SOLN
10.0000 mL | INTRAMUSCULAR | Status: DC | PRN
  Administered 2015-02-14: 10 mL via INTRAVENOUS
  Filled 2015-02-13: qty 10

## 2015-02-13 MED ORDER — METOPROLOL TARTRATE 50 MG PO TABS
50.0000 mg | ORAL_TABLET | Freq: Once | ORAL | Status: AC
  Administered 2015-02-13: 50 mg via ORAL
  Filled 2015-02-13: qty 1

## 2015-02-13 MED ORDER — DIGOXIN 125 MCG PO TABS
0.1250 mg | ORAL_TABLET | Freq: Every day | ORAL | Status: DC
  Administered 2015-02-14: 0.125 mg via ORAL
  Filled 2015-02-13: qty 1

## 2015-02-13 NOTE — Progress Notes (Signed)
ABD pad applied to lt abdomen by staples to monitor drainage from that sight.

## 2015-02-13 NOTE — Progress Notes (Signed)
[place] [facility] [place] NAME: [name]    MR#:  [id number]  DATE OF BIRTH:  02/08/1951  SUBJECTIVE:  CHIEF COMPLAINT:   Chief Complaint  Patient presents with  . Abdominal Pain   Doing well. No chest pain shortness of breath. No tachycardia. Ostomy is working  REVIEW OF SYSTEMS:   Review of Systems  Constitutional: Negative for fever.  Respiratory: Negative for shortness of breath.   Cardiovascular: Negative for chest pain and palpitations.  Gastrointestinal: Positive for nausea. Negative for vomiting and abdominal pain.  Genitourinary: Negative for dysuria.    DRUG ALLERGIES:  No Known Allergies  VITALS:  Blood pressure 156/97, pulse 102, temperature 98.5 F (36.9 C), temperature source Oral, resp. rate 18, height 5\' 3"  (1.6 m), weight 109.634 kg (241 lb 11.2 oz), SpO2 95 %.  PHYSICAL EXAMINATION:  GENERAL:  64 y.o.-year-old patient lying in the bed with no acute distress. Obese.  LUNGS: Normal breath sounds bilaterally, no wheezing, rales,rhonchi or crepitation. No use of accessory muscles of respiration.  CARDIOVASCULAR: S1, S2 normal. No murmurs, rubs, or gallops. tachycardic ABDOMEN: Soft, some tenderness in lower quadrants, ostomy in left lower quadrant with green/brown output, nondistended. Bowel sounds decreased.  EXTREMITIES: No pedal edema, cyanosis, or clubbing.  NEUROLOGIC: Cranial nerves II through XII are intact. Muscle strength 5/5 in all extremities. Sensation intact. Gait not checked.  PSYCHIATRIC: The patient is alert and oriented x 3.  SKIN: No obvious rash, lesion, or ulcer.    LABORATORY PANEL:   CBC  Recent Labs Lab 02/13/15 0413  WBC 10.4  HGB 8.5*  HCT 26.2*  PLT 257   ------------------------------------------------------------------------------------------------------------------  Chemistries   Recent Labs Lab 02/13/15 0413  NA 138  K 3.0*  CL 103  CO2 27  GLUCOSE 148*  BUN  33*  CREATININE 2.63*  CALCIUM 6.2*  AST 13*  ALT 11*  ALKPHOS 87  BILITOT 0.4   ------------------------------------------------------------------------------------------------------------------  Cardiac Enzymes No results for input(s): TROPONINI in the last 168 hours. ------------------------------------------------------------------------------------------------------------------  RADIOLOGY:  No results found.  EKG:   Orders placed or performed during the hospital encounter of 02/04/15  . ED EKG  . ED EKG  . EKG 12-Lead  . EKG 12-Lead    ASSESSMENT AND PLAN:    #1 small bowel obstruction, possible incarceration of small bowel in the periostomal hernia: - Status post surgical reduction of hernia on 12/19 - Good ostomy output today, no pain  #2 diabetes mellitus type 2: - hemoglobin A1c is 9.2, poor control in outpatient setting - Continue SSI, has not required much insulin at all  #3 hypertension - Formerly hypotensive so have held off on antihypertensives - Continue metoprolol, restart Avapro  #4 hypokalemia - Formerly hyperkalemic, now low, replace and monitor  #5 acute kidney failure on chronic kidney disease stage III: improving - appreciate nephrology consultation - likely due to hypotension and IV contrast - Now that BP is up will restart Avapro  #6 hypothyroidism -  TSH slightly elevated at 4.5, continue Synthroid  #7 hyperlipidemia - Hold statin for now  #8 tachycardia: Improved - Was likely due to hypotension. Was on amiodarone drip, transitioned to oral, digoxin [facility] cardiology  9. Deconditioning - PT has recommended SNF she should be ready for discharge in the next day or 2  All the records are reviewed and case discussed with Care Management/Social Workerr. Management plans discussed with the patient, family and they are in agreement.  CODE STATUS: Full   TOTAL TIME TAKING CARE OF THIS PATIENT: 35 minutes.  Greater than 50% of time  spent in care coordination and counseling. POSSIBLE D/C IN 1-2 DAYS, DEPENDING ON CLINICAL CONDITION.   Myrtis Ser M.D on 02/13/2015 at 11:51 AM  Between 7am to 6pm - Pager - [phone]  After 6pm go to [email or web address] - password [email or web address] [facility]  [place] [facility]  Office  [phone]  CC: Primary care physician; Adin Hector, MD

## 2015-02-13 NOTE — Progress Notes (Signed)
Notified Dr. Jannifer Franklin of calcium of 6.2 and potassium of 3.0.

## 2015-02-13 NOTE — Progress Notes (Signed)
Metoprolol 50mg  po given per MD orders.

## 2015-02-13 NOTE — Progress Notes (Signed)
Pt assisted up to [facility] back to bed.  Lt side of incision near colostomy bag is oozing serosanguinous fluid, will monitor and notify Md.

## 2015-02-13 NOTE — Care Management Note (Signed)
Case Management Note  Patient Details  Name: [name] MRN: [id number] Date of Birth: 10/06/1950  Subjective/Objective:                  Met with patient to discuss discharge planning. She has used [facility] home [facility] in the past. PT is recommending SNF at this time. She states that her daughter lives with her and she had been independent in the past. She has a new ostomy in place which PT states had a remarkable amount of blood- RN aware per PT. CSW will follow for SNF She denies difficulty with obtaining Rx.   Action/Plan: List of home health agencies left with patient. RNCM will continue to follow.   Expected Discharge Date:                  Expected Discharge Plan:     In-House Referral:  Clinical Social Work  Discharge planning Services  CM Consult  Post Acute Care Choice:  Home Health Choice offered to:     DME Arranged:    DME Agency:     HH Arranged:    HH Agency:  [facility]  Status of Service:  In process, will continue to follow  Medicare Important Message Given:  Yes Date Medicare IM Given:    Medicare IM give by:    Date Additional Medicare IM Given:    Additional Medicare Important Message give by:     If discussed at Long Length of Stay Meetings, dates discussed:    Additional Comments:   , RN 02/13/2015, 11:17 AM  

## 2015-02-13 NOTE — Care Management Important Message (Signed)
Important Message  Patient Details  Name: [name] MRN: [id number] Date of Birth: 10-29-1950   Medicare Important Message Given:  Yes    Marshell Garfinkel, RN 02/13/2015, 7:22 AM

## 2015-02-13 NOTE — Plan of Care (Signed)
Problem: Safety: Goal: Ability to remain free from injury will improve Outcome: Progressing Fall precautions in place  Problem: Pain Managment: Goal: General experience of comfort will improve Outcome: Progressing Prn meds  Problem: Physical Regulation: Goal: Ability to maintain clinical measurements within normal limits will improve Outcome: Not Progressing PT working with pt, recommends SNF Goal: Will remain free from infection Outcome: Progressing IV antibiotics  Problem: Tissue Perfusion: Goal: Risk factors for ineffective tissue perfusion will decrease Outcome: Progressing Lovenox  Problem: Cardiac: Goal: Ability to maintain an adequate cardiac output will improve Outcome: Progressing Rate controlled

## 2015-02-13 NOTE — Progress Notes (Signed)
Remains in Afib at controlled rate

## 2015-02-13 NOTE — Progress Notes (Signed)
Subjective:   Patient feels well today Serum creatinine improved slightly to 2.63 Urine output appears to be improving also    Objective:  Vital signs in last 24 hours:  Temp:  [97.5 F (36.4 C)-98.7 F (37.1 C)] 98.5 F (36.9 C) (12/26 1136) Pulse Rate:  [63-143] 102 (12/26 1136) Resp:  [16-18] 18 (12/26 1136) BP: (129-156)/(69-104) 156/97 mmHg (12/26 1136) SpO2:  [94 %-98 %] 95 % (12/26 1136) Weight:  [109.634 kg (241 lb 11.2 oz)] 109.634 kg (241 lb 11.2 oz) (12/26 0536)  Weight change: -0.544 kg (-1 lb 3.2 oz) Filed Weights   02/12/15 0500 02/12/15 0624 02/13/15 0536  Weight: 110.179 kg (242 lb 14.4 oz) 108.999 kg (240 lb 4.8 oz) 109.634 kg (241 lb 11.2 oz)    Intake/Output:    Intake/Output Summary (Last 24 hours) at 02/13/15 1404 Last data filed at 02/13/15 1322  Gross per 24 hour  Intake    600 ml  Output   3450 ml  Net  -2850 ml     Physical Exam: General: Morbidly obese lady, laying in the bed, no distress  HEENT Moist oral mucous membranes  Neck supple  Pulm/lungs Limited exam, normal respiratory effort, scattered rhonchi  CVS/Heart tachycardic  Abdomen:  Obese, colostomy in place,  Extremities: Trace to 1+ peripheral edema  Neurologic: Alert, able to follow commands  Skin: no acute rashes  Access:        Basic Metabolic Panel:   Recent Labs Lab 02/09/15 1243  02/10/15 0505 02/10/15 0723 02/11/15 0547 02/11/15 0815 02/12/15 0833 02/13/15 0413  NA 130*  --  134*  --   --  133* 137 138  K 5.0  < > 4.1  4.1 4.0  --  3.3* 3.0* 3.0*  CL 107  --  110  --   --  101 102 103  CO2 17*  --  17*  --   --  21* 25 27  GLUCOSE 134*  --  140*  --   --  230* 156* 148*  BUN 32*  --  33*  --   --  34* 35* 33*  CREATININE 3.37*  --  3.18*  --  2.93* 2.91* 2.91* 2.63*  CALCIUM 6.9*  --  6.8*  --   --  6.6* 6.6* 6.2*  < > = values in this interval not displayed.   CBC:  Recent Labs Lab 02/09/15 0536 02/10/15 0505 02/11/15 0815 02/12/15 0833  02/13/15 0413  WBC 12.7* 11.1* 13.8* 12.1* 10.4  HGB 9.8* 9.3* 8.8* 8.1* 8.5*  HCT 32.1* 29.7* 28.3* 25.2* 26.2*  MCV 84.8 83.1 81.4 80.9 82.2  PLT 235 225 249 236 257      Microbiology:  Recent Results (from the past 720 hour(s))  Culture, blood (Routine X 2) w Reflex to ID Panel     Status: None (Preliminary result)   Collection Time: 02/09/15  5:34 PM  Result Value Ref Range Status   Specimen Description BLOOD LEFT ASSIST CONTROL  Final   Special Requests BOTTLES DRAWN AEROBIC AND ANAEROBIC 1CC  Final   Culture NO GROWTH 4 DAYS  Final   Report Status PENDING  Incomplete  Culture, blood (Routine X 2) w Reflex to ID Panel     Status: None (Preliminary result)   Collection Time: 02/09/15  5:35 PM  Result Value Ref Range Status   Specimen Description BLOOD RIGHT HAND  Final   Special Requests BOTTLES DRAWN AEROBIC AND ANAEROBIC  [place]  Final  Culture NO GROWTH 4 DAYS  Final   Report Status PENDING  Incomplete  MRSA PCR Screening     Status: Abnormal   Collection Time: 02/09/15  5:56 PM  Result Value Ref Range Status   MRSA by PCR POSITIVE (A) NEGATIVE Final    Comment: CRITICAL RESULT CALLED TO, READ BACK BY AND VERIFIED WITH: DEL HOPKINS @ 1936 ON 02/09/2015 BY CAF        The GeneXpert MRSA Assay (FDA approved for NASAL specimens only), is one component of a comprehensive MRSA colonization surveillance program. It is not intended to diagnose MRSA infection nor to guide or monitor treatment for MRSA infections.     Coagulation Studies: No results for input(s): LABPROT, INR in the last 72 hours.  Urinalysis: No results for input(s): COLORURINE, LABSPEC, PHURINE, GLUCOSEU, HGBUR, BILIRUBINUR, KETONESUR, PROTEINUR, UROBILINOGEN, NITRITE, LEUKOCYTESUR in the last 72 hours.  Invalid input(s): APPERANCEUR    Imaging: No results found.   Medications:     . amiodarone  200 mg Oral Daily  . Chlorhexidine Gluconate Cloth  6 each Topical Q0600  . digoxin  0.125 mg  Oral Daily  . DULoxetine  30 mg Oral Daily  . enoxaparin (LOVENOX) injection  40 mg Subcutaneous Q24H  . insulin aspart  0-5 Units Subcutaneous QHS  . insulin aspart  0-9 Units Subcutaneous TID WC  . irbesartan  37.5 mg Oral Daily  . levothyroxine  100 mcg Oral QAC breakfast  . metoprolol tartrate  50 mg Oral BID  . mupirocin nasal ointment   Nasal BID   acetaminophen **OR** acetaminophen, ondansetron **OR** ondansetron (ZOFRAN) IV, oxyCODONE-acetaminophen  Assessment/ Plan:  64 y.o. 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 Acute renal failure on chronic kidney disease stage III Baseline creatinine appears to be 1.93/GFR 30 from August 2016.  Acute renal failure is likely secondary to multiple factors including IV contrast exposure, possible ATN from concurrent events At present, renal function appears to be improving slowly Electrolytes and volume status are acceptable No acute indication for dialysis  We will continue to monitor electrolytes and volume status on daily basis  2.Hypokalemia, previously Hyperkalemia Patient was previously getting IV fluids with potassium supplementation it has been stopped now  potassium is actually low Expected to improve with normal diet    LOS: 9 [name] 12/26/20162:04 PM

## 2015-02-13 NOTE — Progress Notes (Signed)
64 yr old POD#6 from Ex lap and hernia repair for incarcerated parastomal hernia.  Patient doing well today.  She states eating better although slow on the appetite.  She states that she has been walking some in the room but was too tired to work well with PT today.   Filed Vitals:   02/13/15 0923 02/13/15 1136  BP: 150/104 156/97  Pulse: 143 102  Temp:  98.5 F (36.9 C)  Resp:  18   I/O last 3 completed shifts: In: 600 [I.V.:600] Out: 1300 [Urine:1000; Stool:300] Total I/O In: -  Out: 2450 [Urine:2150; Stool:300]   PE:  Gen: NAD Abd: soft, midline incision c/d/i, left sided ostomy with incision lateral with some superficial necrolysis and ecchymosis, serous drainage   CBC Latest Ref Rng 02/13/2015 02/12/2015 02/11/2015  WBC 3.6 - 11.0 K/uL 10.4 12.1(H) 13.8(H)  Hemoglobin 12.0 - 16.0 g/dL 8.5(L) 8.1(L) 8.8(L)  Hematocrit 35.0 - 47.0 % 26.2(L) 25.2(L) 28.3(L)  Platelets 150 - 440 K/uL 257 236 249   CMP Latest Ref Rng 02/13/2015 02/12/2015 02/11/2015  Glucose 65 - 99 mg/dL 148(H) 156(H) 230(H)  BUN 6 - 20 mg/dL 33(H) 35(H) 34(H)  Creatinine 0.44 - 1.00 mg/dL 2.63(H) 2.91(H) 2.91(H)  Sodium 135 - 145 mmol/L 138 137 133(L)  Potassium 3.5 - 5.1 mmol/L 3.0(L) 3.0(L) 3.3(L)  Chloride 101 - 111 mmol/L 103 102 101  CO2 22 - 32 mmol/L 27 25 21(L)  Calcium 8.9 - 10.3 mg/dL 6.2(LL) 6.6(L) 6.6(L)  Total Protein 6.5 - 8.1 g/dL 5.6(L) - -  Total Bilirubin 0.3 - 1.2 mg/dL 0.4 - -  Alkaline Phos 38 - 126 U/L 87 - -  AST 15 - 41 U/L 13(L) - -  ALT 14 - 54 U/L 11(L) - -    A/P:  WBC improving today Cr continue to improve as well  Encouraged ambulation and working with PT so we can have the appropriate evaluation and get her into rehab

## 2015-02-13 NOTE — Progress Notes (Signed)
Per Amy admissions coordinator at [place] office will have to run her financial status before extending a bed offer. [place] office is closed today and they will open tomorrow. Clinical Social Worker (CSW) made patient aware of above. CSW left voicemail with [facility] case manager. CSW will continue to follow and assist as as needed.   Blima Rich, [place] [phone]

## 2015-02-13 NOTE — NC FL2 (Signed)
[place] LEVEL OF CARE SCREENING TOOL     IDENTIFICATION  Patient Name: [name] Birthdate: [birth year] Sex: female Admission Date (Current Location): 02/04/2015  [place] and [place] Number:  Engineering geologist and Address:  [facility], [address], [place], [place] [address]      Provider Number: [id number]  Attending Physician Name and Address:  Sherri Rad, MD  Relative Name and Phone Number:       Current Level of Care: Hospital Recommended Level of Care: [place] Prior Approval Number:    Date Approved/Denied:   PASRR Number:  ([id number])  Discharge Plan: SNF    Current Diagnoses: Patient Active Problem List   Diagnosis Date Noted  . Small bowel obstruction ([place]) 02/04/2015  . Incarcerated hernia   . Diverticulitis of colon 09/05/2014  . Obesity 09/05/2014    Orientation RESPIRATION BLADDER Height & Weight    Self, Time, Situation, Place  Normal Continent 5\' 3"  (160 cm) 241 lbs.  BEHAVIORAL SYMPTOMS/MOOD NEUROLOGICAL BOWEL NUTRITION STATUS   (none )  (none ) Incontinent, Colostomy Diet (Regular Diet )  AMBULATORY STATUS COMMUNICATION OF NEEDS Skin   Extensive Assist Verbally Surgical wounds (Incision: Mid Abdomen )                       Personal Care Assistance Level of Assistance  Bathing, Feeding, Dressing Bathing Assistance: Limited assistance Feeding assistance: Independent Dressing Assistance: Limited assistance     Functional Limitations Info  Sight, Hearing, Speech Sight Info: Impaired Hearing Info: Adequate Speech Info: Adequate    SPECIAL CARE FACTORS FREQUENCY  PT (By licensed PT)     PT Frequency:  (5)              Contractures      Additional Factors Info  Code Status, Insulin Sliding Scale, Isolation Precautions Code Status Info:  (Full Code. )     Insulin Sliding Scale Info:  (Novolog insulin injections ) Isolation Precautions Info:  (MRSA nasal  swab )     Current Medications (02/13/2015):  This is the current hospital active medication list Current Facility-Administered Medications  Medication Dose Route Frequency Provider Last Rate Last Dose  . acetaminophen (TYLENOL) tablet 650 mg  650 mg Oral Q6H PRN Sherri Rad, MD   650 mg at 02/09/15 1229   Or  . acetaminophen (TYLENOL) suppository 650 mg  650 mg Rectal Q6H PRN Sherri Rad, MD      . amiodarone (PACERONE) tablet 200 mg  200 mg Oral Daily Aldean Jewett, MD   200 mg at 02/13/15 0900  . Chlorhexidine Gluconate Cloth 2 % PADS 6 each  6 each Topical Q0600 Aldean Jewett, MD   6 each at 02/13/15 0600  . digoxin (LANOXIN) tablet 0.125 mg  0.125 mg Oral Daily Dwayne D [place], MD   0.125 mg at 02/13/15 0900  . DULoxetine (CYMBALTA) DR capsule 30 mg  30 mg Oral Daily Sherri Rad, MD   30 mg at 02/13/15 0900  . enoxaparin (LOVENOX) injection 40 mg  40 mg Subcutaneous Q24H Sherri Rad, MD   40 mg at 02/13/15 0008  . insulin aspart (novoLOG) injection 0-5 Units  0-5 Units Subcutaneous QHS Aldean Jewett, MD   0 Units at 02/04/15 2256  . insulin aspart (novoLOG) injection 0-9 Units  0-9 Units Subcutaneous TID WC Aldean Jewett, MD   2 Units at 02/12/15 1240  . levothyroxine (SYNTHROID, LEVOTHROID)  tablet 100 mcg  100 mcg Oral QAC breakfast Sherri Rad, MD   100 mcg at 02/13/15 0802  . metoprolol (LOPRESSOR) tablet 50 mg  50 mg Oral BID Corey Skains, MD   50 mg at 02/13/15 0900  . mupirocin nasal ointment (BACTROBAN) 2 %   Nasal BID Sherri Rad, MD   1 application at [id number]  . ondansetron (ZOFRAN) tablet 4 mg  4 mg Oral Q6H PRN Sherri Rad, MD       Or  . ondansetron [facility]) injection 4 mg  4 mg Intravenous Q6H PRN Sherri Rad, MD      . oxyCODONE-acetaminophen (PERCOCET/ROXICET) 5-325 MG per tablet 1 tablet  1 tablet Oral Q6H PRN Aldean Jewett, MD   1 tablet at 02/13/15 0236  . potassium chloride SA (K-DUR,KLOR-CON) CR tablet 40 mEq  40 mEq Oral Q4H Aldean Jewett, MD   40  mEq at 02/13/15 [id number]     Discharge Medications: Please see discharge summary for a list of discharge medications.  Relevant Imaging Results:  Relevant Lab Results:   Additional Information  (SSN: [id number])  Loralyn Freshwater, LCSW

## 2015-02-13 NOTE — Clinical Social Work Placement (Signed)
   CLINICAL SOCIAL WORK PLACEMENT  NOTE  Date:  02/13/2015  Patient Details  Name: [name] MRN: [id number] Date of Birth: 07/19/1950  Clinical Social Work is seeking post-discharge placement for this patient at the [place] level of care (*CSW will initial, date and re-position this form in  chart as items are completed):  Yes   Patient/family provided with [place] Work Department's list of facilities offering this level of care within the geographic area requested by the patient (or if unable, by the patient's family).  Yes   Patient/family informed of their freedom to choose among providers that offer the needed level of care, that participate in Medicare, Medicaid or managed care program needed by the patient, have an available bed and are willing to accept the patient.  Yes   Patient/family informed of [facility]'s ownership interest in [facility] and [facility], as well as of the fact that they are under no obligation to receive care at these facilities.  PASRR submitted to EDS on       PASRR number received on       Existing PASRR number confirmed on 02/13/15     FL2 transmitted to all facilities in geographic area requested by pt/family on 02/13/15     FL2 transmitted to all facilities within larger geographic area on       Patient informed that his/her managed care company has contracts with or will negotiate with certain facilities, including the following:            Patient/family informed of bed offers received.  Patient chooses bed at       Physician recommends and patient chooses bed at      Patient to be transferred to   on  .  Patient to be transferred to facility by       Patient family notified on   of transfer.  Name of family member notified:        PHYSICIAN       Additional Comment:    _______________________________________________ Loralyn Freshwater, LCSW 02/13/2015, 10:18 AM

## 2015-02-13 NOTE — Progress Notes (Signed)
Spoke with Dr. Volanda Napoleon, new orders received.

## 2015-02-13 NOTE — Progress Notes (Signed)
RN was just informed by Tele clerk that pt had went into [facility] around 1130-1200 today.  Page out to Dr. Volanda Napoleon

## 2015-02-13 NOTE — Evaluation (Signed)
Physical Therapy Evaluation Patient Details Name: [name] MRN: [id number] DOB: Sep 03, 1950 Today's Date: 02/13/2015   History of Present Illness  [name] is a 64 y.o. female. Who presents with the sudden onset of left lower quadrant abdominal pain and mild nausea but no vomiting starting Friday late afternoon and progressing over the course of the evening prompting her and her daughter to visit the emergency room for further evaluation and treatment. She states that the left side her abdomen became acutely more swollen within the last 12-24 hours. Her ostomy continues to function. She is passing gas via the ostomy. Her past surgical history dates back to March 2015 when she presented with a large bowel obstruction secondary to a sigmoid diverticular stricture. Concurrently, she had a microperforation of the cecum. She underwent a Hartmann's procedure and a patch of omentum was placed over the colonic perforation. Postoperatively, the patient did remarkably well despite the fact that she had long-standing midline wound issues requiring multiple wound VAC changes and care at the wound care center. Several months after this the patient presented back to the emergency room with lower GI bleeding and underwent embolization of bleeding site within the hepatic flexure. Bleeding at that time was deemed secondary to diverticular disease. Since the episode of GI bleeding the patient's had no further bleeding. She had a colonoscopy at the time of her bleeding demonstrated no evidence of mass lesion. She has a brother recently died of metastatic colon cancer in the summer. Conservative management was attempted but pt ultimately had to undergo surgical correction of SBO.  Clinical Impression  Pt reports feeling weak and tired throughout session. She requires assistance for bed mobility and upon standing is relatively unsteady on her feet with LE weakness noted. With ambulation pt is quickly fatigued and  reports feeling unwell and dizzy. Vitals obtained and HR remains around 140bpm and BP is elevated, most notably diastolic at 123456. RN notified of above details. RN also notified that pt is bleeding from incision site which has been occurring over the last couple days and RN is aware. Observed tele monitor after encounter and after 10 minutes back in bed HR remains elevated to approximately 140bpm. Pt is deconditioned/weak and with poor cardiac response to limited activity. She has limited support at home. Pt will require SNF placement at discharge. Pt will benefit from skilled PT services to address deficits in strength, balance, and mobility in order to return to full function at home.     Follow Up Recommendations SNF    Equipment Recommendations  None recommended by PT    Recommendations for Other Services       Precautions / Restrictions Precautions Precautions: Fall Restrictions Weight Bearing Restrictions: No      Mobility  Bed Mobility Overal bed mobility: Needs Assistance Bed Mobility: Supine to Sit     Supine to sit: Min assist     General bed mobility comments: Pt requires cues and assist to go from supine to R sidelying and up to sitting. Cues for proper sequencing. Pt requires seated rest break at EOB once upright due to tachycardia. HR at 140 after bed mobility. HOB flat and no rails utilized to simulate home environment  Transfers Overall transfer level: Needs assistance Equipment used: Rolling walker (2 wheeled) Transfers: Sit to/from Stand Sit to Stand: Min guard         General transfer comment: Decreased LE strength and power noted during transfer. Pt with increased time required to perform. Upon  standing pt with some instability in LE and notable fatigue.   Ambulation/Gait Ambulation/Gait assistance: Min assist Ambulation Distance (Feet): 6 Feet Assistive device: Rolling walker (2 wheeled) Gait Pattern/deviations: Decreased step length - right;Decreased  step length - left Gait velocity: Decreased Gait velocity interpretation: Below normal speed for age/gender General Gait Details: Pt with short shuffling steps. LE trembling and notable fatigue. HR monitored and remains >140 bpm. Pt reports feeling "unwell" and "dizzy." Pt reports being unable to ambulate further due to feeling unwell. Returned to bed and vitals obtained. BP elevated with diastolic at 123456. RN notified for elevated HR and BP.   Stairs            Wheelchair Mobility    Modified Rankin (Stroke Patients Only)       Balance Overall balance assessment: Needs assistance Sitting-balance support: No upper extremity supported Sitting balance-Leahy Scale: Good       Standing balance-Leahy Scale: Fair                               Pertinent Vitals/Pain Pain Assessment: No/denies pain    Home Living Family/patient expects to be discharged to:: Private residence Living Arrangements: Children Available Help at Discharge: Family (Daughter works during the day) Type of Home: Apartment Home Access: Level entry     Home Layout: One level Home Equipment: Environmental consultant - 4 wheels;Cane - single point;Shower seat      Prior Function Level of Independence: Independent with assistive device(s)         Comments: Intermittent use of spc for community ambulation     Hand Dominance   Dominant Hand: Right    Extremity/Trunk Assessment   Upper Extremity Assessment: Generalized weakness           Lower Extremity Assessment: Generalized weakness         Communication   Communication: No difficulties  Cognition Arousal/Alertness: Awake/alert Behavior During Therapy: WFL for tasks assessed/performed Overall Cognitive Status: Within Functional Limits for tasks assessed                      General Comments      Exercises        Assessment/Plan    PT Assessment Patient needs continued PT services  PT Diagnosis Difficulty  walking;Abnormality of gait;Generalized weakness   PT Problem List Decreased strength;Decreased activity tolerance;Decreased balance;Decreased mobility;Obesity  PT Treatment Interventions DME instruction;Gait training;Therapeutic activities;Therapeutic exercise;Balance training;Neuromuscular re-education   PT Goals (Current goals can be found in the Care Plan section) Acute Rehab PT Goals Patient Stated Goal: Return to prior level of function PT Goal Formulation: With patient Time For Goal Achievement: 02/27/15 Potential to Achieve Goals: Good    Frequency Min 2X/week   Barriers to discharge Decreased caregiver support Daughter works during the day. No other family in the area. No supervision during daytime    Co-evaluation               End of Session Equipment Utilized During Treatment: Gait belt Activity Tolerance: Patient limited by fatigue;Treatment limited secondary to medical complications (Comment) Patient left: in bed;with call bell/phone within reach;with bed alarm set (Declined up to chair due to feeling dizzy) Nurse Communication: Mobility status (tachycardia, elevated BP, pt feeling unwell)         Time: [id number] PT Time Calculation (min) (ACUTE ONLY): 30 min   Charges:   PT Evaluation $Initial PT Evaluation Tier I: 1  Procedure PT Treatments $Therapeutic Activity: 8-22 mins   PT G Codes:       Lyndel Safe Samaria Anes PT, DPT   Kataryna Mcquilkin 02/13/2015, 9:49 AM

## 2015-02-13 NOTE — Progress Notes (Signed)
A&O. UP with one assist to [facility]. PICC in place in right upper arm. Ostomy in place with stool present. RA.  Medicated for pain with percocet during the night.

## 2015-02-13 NOTE — Clinical Social Work Note (Signed)
Clinical Social Work Assessment  Patient Details  Name: [name] MRN: [id number] Date of Birth: 1950/03/02  Date of referral:  02/13/15               Reason for consult:  Facility Placement                Permission sought to share information with:  Chartered certified accountant granted to share information::  Yes, Verbal Permission Granted  Name::      [place]::   [place]   Relationship::     Contact Information:     Housing/Transportation Living arrangements for the past 2 months:  [place] of Information:  Patient Patient Interpreter Needed:  None Criminal Activity/Legal Involvement Pertinent to Current Situation/Hospitalization:  No - Comment as needed Significant Relationships:  Adult Children, Other Family Members Lives with:  Adult Children, Other (Comment) (Grandson ) Do you feel safe going back to the place where you live?  Yes Need for family participation in patient care:  Yes (Comment)  Care giving concerns:  Patient lives in [place] with her daughter Luetta Nutting and 64 y.o grandson Aiden.    Social Worker assessment / plan: [place] representative (CSW) received verbal consult from PT that patient needs SNF. CSW met with patient to discuss D/C plan. Patient was alert and oriented and laying in the bed. Patient reported that she lives in [place] with her daughter Luetta Nutting and grandson Aiden. Per patient she has had the colostomy for 2 years now. Patient reported that she is weak today. CSW explained that PT is recommending SNF. Patient is agreeable to SNF search in [facility] and prefers [employer]. Per patient she has been to [facility] [place] before last year. CSW explained that patient's Health Team insurance will require authorization. Patient verbalized her understanding. SNF list provided.   FL2 complete and faxed out.   Employment status:  Disabled (Comment on whether or not currently receiving  Disability), Retired Nurse, adult PT Recommendations:  [place] / Referral to community resources:  [place]  Patient/Family's Response to care: Patient is agreeable to [employer].   Patient/Family's Understanding of and Emotional Response to Diagnosis, Current Treatment, and Prognosis: Patient was pleasant throughout assessment and thanked CSW for visit.   Emotional Assessment Appearance:  Appears stated age Attitude/Demeanor/Rapport:    Affect (typically observed):  Accepting, Adaptable, Pleasant Orientation:  Oriented to Self, Oriented to Place, Oriented to  Time, Oriented to Situation Alcohol / Substance use:  Not Applicable Psych involvement (Current and /or in the community):  No (Comment)  Discharge Needs  Concerns to be addressed:  Discharge Planning Concerns Readmission within the last 30 days:  No Current discharge risk:  Dependent with Mobility Barriers to Discharge:  Continued Medical Work up   Loralyn Freshwater, LCSW 02/13/2015, 10:19 AM

## 2015-02-14 LAB — CBC
HCT: 26.9 % — ABNORMAL LOW (ref 35.0–47.0)
HEMOGLOBIN: 8.7 g/dL — AB (ref 12.0–16.0)
MCH: 26.6 pg (ref 26.0–34.0)
MCHC: 32.5 g/dL (ref 32.0–36.0)
MCV: 81.8 fL (ref 80.0–100.0)
PLATELETS: 288 10*3/uL (ref 150–440)
RBC: 3.28 MIL/uL — AB (ref 3.80–5.20)
RDW: 14.2 % (ref 11.5–14.5)
WBC: 8.4 10*3/uL (ref 3.6–11.0)

## 2015-02-14 LAB — GLUCOSE, CAPILLARY
GLUCOSE-CAPILLARY: 137 mg/dL — AB (ref 65–99)
GLUCOSE-CAPILLARY: 155 mg/dL — AB (ref 65–99)
GLUCOSE-CAPILLARY: 195 mg/dL — AB (ref 65–99)
Glucose-Capillary: 165 mg/dL — ABNORMAL HIGH (ref 65–99)

## 2015-02-14 LAB — BASIC METABOLIC PANEL
ANION GAP: 10 (ref 5–15)
BUN: 30 mg/dL — ABNORMAL HIGH (ref 6–20)
CHLORIDE: 103 mmol/L (ref 101–111)
CO2: 25 mmol/L (ref 22–32)
Calcium: 6.2 mg/dL — CL (ref 8.9–10.3)
Creatinine, Ser: 2.44 mg/dL — ABNORMAL HIGH (ref 0.44–1.00)
GFR, EST AFRICAN AMERICAN: 23 mL/min — AB (ref >60)
GFR, EST NON AFRICAN AMERICAN: 20 mL/min — AB (ref >60)
Glucose, Bld: 139 mg/dL — ABNORMAL HIGH (ref 65–99)
POTASSIUM: 3.4 mmol/L — AB (ref 3.5–5.1)
SODIUM: 138 mmol/L (ref 135–145)

## 2015-02-14 MED ORDER — METOPROLOL TARTRATE 50 MG PO TABS
75.0000 mg | ORAL_TABLET | Freq: Two times a day (BID) | ORAL | Status: DC
  Administered 2015-02-14 – 2015-02-15 (×2): 75 mg via ORAL
  Filled 2015-02-14 (×2): qty 1

## 2015-02-14 MED ORDER — POTASSIUM CHLORIDE CRYS ER 20 MEQ PO TBCR
40.0000 meq | EXTENDED_RELEASE_TABLET | Freq: Once | ORAL | Status: AC
  Administered 2015-02-14: 40 meq via ORAL
  Filled 2015-02-14: qty 2

## 2015-02-14 MED ORDER — DIGOXIN 125 MCG PO TABS
0.0625 mg | ORAL_TABLET | Freq: Every day | ORAL | Status: DC
  Administered 2015-02-15: 0.0625 mg via ORAL
  Filled 2015-02-14: qty 1

## 2015-02-14 NOTE — Plan of Care (Signed)
Problem: Skin Integrity: Goal: Demonstration of wound healing without infection will improve Midline and lateral wound has leakage. No fowl odor. Pt abdomen is distended. Patient has complained of abdominal pain X2. Patient has a blister on LLQ. Order wound care consult for stomy care. Patient is changing appliance multiple times a day. Educated patient about skin care. Skin appears irritated.

## 2015-02-14 NOTE — Progress Notes (Signed)
64 yr old POD#7 from Ex lap and hernia repair for incarcerated parastomal hernia.  Patient doing well today.  States appetite somewhat improved as well.  She did work with PT today too.   Filed Vitals:   02/14/15 1156 02/14/15 1606  BP: 150/75   Pulse: 62 83  Temp: 98.6 F (37 C)   Resp: 18    I/O last 3 completed shifts: In: 600 [P.O.:600] Out: [id number] [Urine:4350; Stool:400]     PE:  Gen: NAD Abd: soft, midline incision c/d/i, left sided ostomy with incision lateral with some superficial necrolysis and ecchymosis, serous drainage   CBC Latest Ref Rng 02/14/2015 02/13/2015 02/12/2015  WBC 3.6 - 11.0 K/uL 8.4 10.4 12.1(H)  Hemoglobin 12.0 - 16.0 g/dL 8.7(L) 8.5(L) 8.1(L)  Hematocrit 35.0 - 47.0 % 26.9(L) 26.2(L) 25.2(L)  Platelets 150 - 440 K/uL 288 257 236   CMP Latest Ref Rng 02/14/2015 02/13/2015 02/13/2015  Glucose 65 - 99 mg/dL 139(H) 218(H) 148(H)  BUN 6 - 20 mg/dL 30(H) 30(H) 33(H)  Creatinine 0.44 - 1.00 mg/dL 2.44(H) 2.65(H) 2.63(H)  Sodium 135 - 145 mmol/L 138 135 138  Potassium 3.5 - 5.1 mmol/L 3.4(L) 3.5 3.0(L)  Chloride 101 - 111 mmol/L 103 101 103  CO2 22 - 32 mmol/L 25 26 27   Calcium 8.9 - 10.3 mg/dL 6.2(LL) 6.3(LL) 6.2(LL)  Total Protein 6.5 - 8.1 g/dL - - 5.6(L)  Total Bilirubin 0.3 - 1.2 mg/dL - - 0.4  Alkaline Phos 38 - 126 U/L - - 87  AST 15 - 41 U/L - - 13(L)  ALT 14 - 54 U/L - - 11(L)    A/P:  WBC now normalized off Abx.  Cr continue to improve as well  Encouraged ambulation and working with PT so we can have the appropriate evaluation and get her into rehab

## 2015-02-14 NOTE — Progress Notes (Signed)
Per Kim admissions coordinator at [facility] they cannot make a bed offer because patient has an outstanding balance at [facility]. Clinical Social Worker (CSW) met with patient and made her aware of above. CSW presented other bed offers. Patient chose [facility]. CSW contacted [facility] liaison and made him aware of accepted bed offer. CSW contacted [facility] case manager and made her aware of above. CSW will continue to follow and assist as needed.    [place], LCSWA [phone] 

## 2015-02-14 NOTE — Progress Notes (Addendum)
Patient remains alert and oriented, VS stable,, incision site  Mild abdomen dry, staples in place . PRN pain med administer for pain as per request, wound care consult pending , NSR on tele, patient encourage to drink fluid as per nephrologist recommendation  order . Patient ambulate with PT. PRN pain med administer as per order.

## 2015-02-14 NOTE — Progress Notes (Signed)
[place] [facility] [place] NAME: Clinique Schappert    MR#:  [id number]  DATE OF BIRTH:  [birth year]  SUBJECTIVE:  CHIEF COMPLAINT:   Chief Complaint  Patient presents with  . Abdominal Pain   Doing well. Denies any complaints today. Resting well. Ostomy is working  REVIEW OF SYSTEMS:   Review of Systems  Constitutional: Negative for fever.  Respiratory: Negative for shortness of breath.   Cardiovascular: Negative for chest pain and palpitations.  Gastrointestinal: Positive for nausea. Negative for vomiting and abdominal pain.  Genitourinary: Negative for dysuria.    DRUG ALLERGIES:  No Known Allergies  VITALS:  Blood pressure 150/75, pulse 83, temperature 98.6 F (37 C), temperature source Oral, resp. rate 18, height 5\' 3"  (1.6 m), weight 109.317 kg (241 lb), SpO2 97 %.  PHYSICAL EXAMINATION:  GENERAL:  64 y.o.-year-old patient lying in the bed with no acute distress. Obese.  LUNGS: Normal breath sounds bilaterally, no wheezing, rales,rhonchi or crepitation. No use of accessory muscles of respiration.  CARDIOVASCULAR: S1, S2 normal. No murmurs, rubs, or gallops. tachycardic ABDOMEN: Soft, some tenderness in lower quadrants, ostomy in left lower quadrant with green/brown output, nondistended.  Positive bowel sounds EXTREMITIES: No pedal edema, cyanosis, or clubbing.  NEUROLOGIC: Cranial nerves II through XII are intact. Muscle strength 5/5 in all extremities. Sensation intact. Gait not checked.  PSYCHIATRIC: The patient is alert and oriented x 3.  SKIN: No obvious rash, lesion, or ulcer.    LABORATORY PANEL:   CBC  Recent Labs Lab 02/14/15 0425  WBC 8.4  HGB 8.7*  HCT 26.9*  PLT 288   ------------------------------------------------------------------------------------------------------------------  Chemistries   Recent Labs Lab 02/13/15 0413  02/14/15 0425  NA 138  < > 138  K 3.0*  < > 3.4*  CL 103  < > 103  CO2 27   < > 25  GLUCOSE 148*  < > 139*  BUN 33*  < > 30*  CREATININE 2.63*  < > 2.44*  CALCIUM 6.2*  < > 6.2*  AST 13*  --   --   ALT 11*  --   --   ALKPHOS 87  --   --   BILITOT 0.4  --   --   < > = values in this interval not displayed. ------------------------------------------------------------------------------------------------------------------  Cardiac Enzymes No results for input(s): TROPONINI in the last 168 hours. ------------------------------------------------------------------------------------------------------------------  RADIOLOGY:  No results found.  EKG:   Orders placed or performed during the hospital encounter of 02/04/15  . ED EKG  . ED EKG  . EKG 12-Lead  . EKG 12-Lead    ASSESSMENT AND PLAN:    #1 small bowel obstruction, possible incarceration of small bowel in the periostomal hernia: - Status post surgical reduction of hernia on 12/19 - Good ostomy output , no pain  #2 diabetes mellitus type 2: - hemoglobin A1c is 9.2, poor control in outpatient setting - Continue SSI, has not required much insulin at all  #3 hypertension ,blood pressure is elevated - Formerly hypotensive so have held off on antihypertensives - Continue metoprolol dose increased to 75 mg by mouth twice a day,, restarted Avapro  #4 hypokalemia - Formerly hyperkalemic, now low, replace and monitor  #5 acute kidney failure on chronic kidney disease stage [id number] to ATN  improving-creatinine at 2.63-->2.44 today - appreciate nephrology consultation - likely due to hypotension and IV contrast   #6 hypothyroidism -  TSH slightly elevated at 4.5, could be  from acute phase reaction ,continue Synthroid Repeat thyroid function tests outpatient  #7 hyperlipidemia - Hold statin for now  #8 tachycardia: Improved - Was likely due to hypotension. Was on amiodarone drip, transitioned to oral, digoxin [facility] cardiology  9. Deconditioning - PT has recommended SNF she should be  ready for discharge in the next day or 2  All the records are reviewed and case discussed with Care Management/Social Workerr. Management plans discussed with the patient, family and they are in agreement.  CODE STATUS: Full   TOTAL TIME TAKING CARE OF THIS PATIENT: 35 minutes.  Greater than 50% of time spent in care coordination and counseling. POSSIBLE D/C IN 1-2 DAYS, DEPENDING ON CLINICAL CONDITION.   Nicholes Mango M.D on 02/14/2015 at 4:39 PM  Between 7am to 6pm - Pager - [phone]  After 6pm go to [email or web address] - password [email or web address] [facility]  [place] [facility]  Office  [phone]  CC: Primary care physician; Adin Hector, MD

## 2015-02-14 NOTE — Consult Note (Signed)
WOC ostomy consult note Stoma type/location: LLQ Colostomy, duration 2 years Stomal assessment/size: 2 " round and moist Peristomal assessment: intact Treatment options for stomal/peristomal skin: Stoma powder Output soft brown stool Ostomy pouching: 2pc.2 3/4' system with stoma powder  Education provided: Patient is independent in self care.  Enrolled patient in [place] program: No  WOC wound consult note Reason for Consult: MIdline staple line, clean dry and intact Staple line to left lower quadrant, near abdominal pannus.  Has been draining purulence and has denuded the skin around the pannus.  WIll order silver infused wicking linen to protect this skin and promote healing. Right side abdominal pannus is clear.  Wound type:Moisture Associated Skin damage from wound drainage.  Pressure Ulcer POA: N/a Measurement: 4 cm x 4 cm x 0.1 cm denuded skin with erythema and staple line to LLQ near abdominal pannus Wound [id number] and moist Drainage (amount, consistency, odor) minimal purlulent  Irritating to skin Periwound:bruising from surgery Dressing procedure/placement/frequency:Cleanse left abdominal pannus with soap and water.  Apply Interdry Ag wicking fiber.  Measure and cut length of InterDry Ag+ to fit in skin folds that have skin breakdown Tuck InterDry  Ag+ fabric into skin folds in a single layer, allow for 2 inches of overhang from skin edges to allow for wicking to occur May remove to bathe; dry area thoroughly and then tuck into affected areas again Do not apply any creams or ointments when using InterDry Ag+ DO NOT THROW AWAY FOR 5 DAYS unless soiled with stool DO NOT [facility] product, this will inactivate the silver in the material  New sheet of Interdry Ag+ should be applied after 5 days of use if patient continues to have skin breakdown  Will not follow at this time.  Please re-consult if needed.  Domenic Moras RN BSN [place] Pager [phone]

## 2015-02-14 NOTE — Progress Notes (Signed)
Subjective:  Renal function slightly improved. Cr down to 2.44 at the moment.  K up to 3.4 as well.   Denies pain at the moment.   Objective:  Vital signs in last 24 hours:  Temp:  [98.4 F (36.9 C)-98.6 F (37 C)] 98.6 F (37 C) (12/27 1156) Pulse Rate:  [62-90] 62 (12/27 1156) Resp:  [18-24] 18 (12/27 1156) BP: (126-150)/(69-90) 150/75 mmHg (12/27 1156) SpO2:  [97 %-98 %] 97 % (12/27 1156) Weight:  [109.317 kg (241 lb)] 109.317 kg (241 lb) (12/27 0500)  Weight change: -0.318 kg (-11.2 oz) Filed Weights   02/12/15 0624 02/13/15 0536 02/14/15 0500  Weight: 108.999 kg (240 lb 4.8 oz) 109.634 kg (241 lb 11.2 oz) 109.317 kg (241 lb)    Intake/Output:    Intake/Output Summary (Last 24 hours) at 02/14/15 1535 Last data filed at 02/14/15 1436  Gross per 24 hour  Intake    360 ml  Output   2000 ml  Net  -1640 ml     Physical Exam: General: Morbidly obese lady, laying in the bed  HEENT Moist oral mucous membranes  Neck supple  Pulm/lungs CTAB normal effort  CVS/Heart S1S2 no rubs  Abdomen:  Obese, colostomy in place, BS present  Extremities: Trace peripheral edema  Neurologic: Awake, alert, follows commands  Skin: no acute rashes  Access:        Basic Metabolic Panel:   Recent Labs Lab 02/11/15 0815 02/12/15 0833 02/13/15 0413 02/13/15 1900 02/14/15 0425  NA 133* 137 138 135 138  K 3.3* 3.0* 3.0* 3.5 3.4*  CL 101 102 103 101 103  CO2 21* 25 27 26 25   GLUCOSE 230* 156* 148* 218* 139*  BUN 34* 35* 33* 30* 30*  CREATININE 2.91* 2.91* 2.63* 2.65* 2.44*  CALCIUM 6.6* 6.6* 6.2* 6.3* 6.2*     CBC:  Recent Labs Lab 02/10/15 0505 02/11/15 0815 02/12/15 0833 02/13/15 0413 02/14/15 0425  WBC 11.1* 13.8* 12.1* 10.4 8.4  HGB 9.3* 8.8* 8.1* 8.5* 8.7*  HCT 29.7* 28.3* 25.2* 26.2* 26.9*  MCV 83.1 81.4 80.9 82.2 81.8  PLT 225 249 236 257 288      Microbiology:  Recent Results (from the past 720 hour(s))  Culture, blood (Routine X 2) w Reflex to ID  Panel     Status: None (Preliminary result)   Collection Time: 02/09/15  5:34 PM  Result Value Ref Range Status   Specimen Description BLOOD LEFT ASSIST CONTROL  Final   Special Requests BOTTLES DRAWN AEROBIC AND ANAEROBIC 1CC  Final   Culture NO GROWTH 4 DAYS  Final   Report Status PENDING  Incomplete  Culture, blood (Routine X 2) w Reflex to ID Panel     Status: None (Preliminary result)   Collection Time: 02/09/15  5:35 PM  Result Value Ref Range Status   Specimen Description BLOOD RIGHT HAND  Final   Special Requests BOTTLES DRAWN AEROBIC AND ANAEROBIC  1CC  Final   Culture NO GROWTH 4 DAYS  Final   Report Status PENDING  Incomplete  MRSA PCR Screening     Status: Abnormal   Collection Time: 02/09/15  5:56 PM  Result Value Ref Range Status   MRSA by PCR POSITIVE (A) NEGATIVE Final    Comment: CRITICAL RESULT CALLED TO, READ BACK BY AND VERIFIED WITH: DEL HOPKINS @ 1936 ON 02/09/2015 BY CAF        The GeneXpert MRSA Assay (FDA approved for NASAL specimens only), is one component of  a comprehensive MRSA colonization surveillance program. It is not intended to diagnose MRSA infection nor to guide or monitor treatment for MRSA infections.     Coagulation Studies: No results for input(s): LABPROT, INR in the last 72 hours.  Urinalysis: No results for input(s): COLORURINE, LABSPEC, PHURINE, GLUCOSEU, HGBUR, BILIRUBINUR, KETONESUR, PROTEINUR, UROBILINOGEN, NITRITE, LEUKOCYTESUR in the last 72 hours.  Invalid input(s): APPERANCEUR    Imaging: No results found.   Medications:     . amiodarone  200 mg Oral Daily  . [START ON 02/15/2015] digoxin  0.0625 mg Oral Daily  . DULoxetine  30 mg Oral Daily  . enoxaparin (LOVENOX) injection  40 mg Subcutaneous Q24H  . insulin aspart  0-5 Units Subcutaneous QHS  . insulin aspart  0-9 Units Subcutaneous TID WC  . irbesartan  37.5 mg Oral Daily  . levothyroxine  100 mcg Oral QAC breakfast  . metoprolol tartrate  50 mg Oral BID   . mupirocin nasal ointment   Nasal BID  . potassium chloride  40 mEq Oral Once  . sodium chloride  10 mL Intravenous Q12H   acetaminophen **OR** acetaminophen, ondansetron **OR** ondansetron (ZOFRAN) IV, oxyCODONE-acetaminophen, sodium chloride  Assessment/ Plan:  64 y.o. 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 Acute renal failure on chronic kidney disease stage III Baseline creatinine appears to be 1.93/GFR 30 from August 2016.  Acute renal failure is likely secondary to multiple factors including IV contrast exposure, possible ATN from concurrent events -Cr down to 2.44 at the moment, still above her baseline. Continue supportive care at the moment.  If Cr begins going up may need to start gentle IVF hydratino.  2.Hypokalemia, previously Hyperkalemia K up to 3.4, has been given repletion.    3.  Anemia of CKD:  hgb currently 8.7, has been relatively stable over the past several days, but hgb was close to 10 last week.  No urgent indication for epogen, but may need to consider this.    LOS: 10 [name] 12/27/20163:35 PM

## 2015-02-14 NOTE — Progress Notes (Signed)
Physical Therapy Treatment Patient Details Name: [name] MRN: [id number] DOB: 1951-01-25 Today's Date: 02/14/2015    History of Present Illness [name] is a 65 y.o. female. Who presents with the sudden onset of left lower quadrant abdominal pain and mild nausea but no vomiting starting Friday late afternoon and progressing over the course of the evening prompting her and her daughter to visit the emergency room for further evaluation and treatment. She states that the left side her abdomen became acutely more swollen within the last 12-24 hours. Her ostomy continues to function. She is passing gas via the ostomy. Her past surgical history dates back to March 2015 when she presented with a large bowel obstruction secondary to a sigmoid diverticular stricture. Concurrently, she had a microperforation of the cecum. She underwent a Hartmann's procedure and a patch of omentum was placed over the colonic perforation. Postoperatively, the patient did remarkably well despite the fact that she had long-standing midline wound issues requiring multiple wound VAC changes and care at the wound care center. Several months after this the patient presented back to the emergency room with lower GI bleeding and underwent embolization of bleeding site within the hepatic flexure. Bleeding at that time was deemed secondary to diverticular disease. Since the episode of GI bleeding the patient's had no further bleeding. She had a colonoscopy at the time of her bleeding demonstrated no evidence of mass lesion. She has a brother recently died of metastatic colon cancer in the summer. Conservative management was attempted but pt ultimately had to undergo surgical correction of SBO.    PT Comments    Pt tolerating treatment session relatively well compared with yesterday, motivated and able to complete entire PT sesssion as planned in spite of constant, stable 7/10 pain. Pt continues to make progress toward goals as  evidenced by improved ambulation distance/toklerance, tolerance of bedside exercises, and decreased level of support with transfers and ambulation. Pt's greatest limitation continues to be decrease activity tolerance (DOE, and weakness) which continues to limit ability to perform all mobility at baseline function. Patient presenting with impairment of strength, pain, range of motion, balance, and activity tolerance, limiting ability to perform ADL and mobility tasks at  baseline level of function. Patient will benefit from skilled intervention to address the above impairments and limitations, in order to restore to prior level of function, improve patient safety upon discharge, and to decrease caregiver burden.    Follow Up Recommendations  SNF     Equipment Recommendations  None recommended by PT    Recommendations for Other Services       Precautions / Restrictions Precautions Precaution Comments: Falls score is 8 Restrictions Weight Bearing Restrictions: No    Mobility  Bed Mobility Overal bed mobility: Needs Assistance Bed Mobility: Supine to Sit     Supine to sit: Min assist     General bed mobility comments: Single HHA for scooting forward and trunk righting.   Transfers Overall transfer level: Modified independent Equipment used: Rolling walker (2 wheeled) Transfers: Sit to/from Stand Sit to Stand: Supervision         General transfer comment: 2x5 with rest. Pt c audible tachypnea. Resting, pt denies dizziness, HR in mid 80's.   Ambulation/Gait Ambulation/Gait assistance: Min guard Ambulation Distance (Feet): 30 Feet Assistive device: Rolling walker (2 wheeled)   Gait velocity: Decreased   General Gait Details: Very slow; requires heavy verbal cues for safety with retro ambulation. Toward end of distance, pt reports feeling very  tired, adn starting to feel weak. Pt asks to go to [facility] where is is left with NA at her request.    Stairs             Wheelchair Mobility    Modified Rankin (Stroke Patients Only)       Balance Overall balance assessment: Modified Independent                                  Cognition Arousal/Alertness: Awake/alert Behavior During Therapy: WFL for tasks assessed/performed Overall Cognitive Status: Within Functional Limits for tasks assessed                      Exercises Other Exercises Other Exercises: Seated LAQ 1x10 bilat Other Exercises: Seated hip flexion 1x10 bilat     General Comments        Pertinent Vitals/Pain Pain Assessment: 0-10 Pain Score: 7  Pain Location: ABD Pain Intervention(s): Limited activity within patient's tolerance;Monitored during session;Premedicated before session    Home Living                      Prior Function            PT Goals (current goals can now be found in the care plan section) Acute Rehab PT Goals Patient Stated Goal: Return to prior level of function PT Goal Formulation: With patient Time For Goal Achievement: 02/27/15 Potential to Achieve Goals: Good Progress towards PT goals: Progressing toward goals    Frequency  Min 2X/week    PT Plan Current plan remains appropriate    Co-evaluation             End of Session Equipment Utilized During Treatment: Gait belt Activity Tolerance: Patient limited by fatigue;Patient tolerated treatment well;Patient limited by pain Patient left: with nursing/sitter in room (on [facility])     Time: [id number] PT Time Calculation (min) (ACUTE ONLY): 19 min  Charges:  $Therapeutic Activity: 8-22 mins                    G Codes:      Terryn Rosenkranz C 02-16-15, 4:12 PM  4:14 PM  Etta Grandchild, PT, DPT [place] License # [id number]

## 2015-02-15 LAB — CULTURE, BLOOD (ROUTINE X 2)
CULTURE: NO GROWTH
CULTURE: NO GROWTH

## 2015-02-15 LAB — GLUCOSE, CAPILLARY
GLUCOSE-CAPILLARY: 132 mg/dL — AB (ref 65–99)
Glucose-Capillary: 191 mg/dL — ABNORMAL HIGH (ref 65–99)

## 2015-02-15 LAB — BASIC METABOLIC PANEL
ANION GAP: 8 (ref 5–15)
BUN: 29 mg/dL — ABNORMAL HIGH (ref 6–20)
CALCIUM: 6.1 mg/dL — AB (ref 8.9–10.3)
CO2: 24 mmol/L (ref 22–32)
CREATININE: 2.33 mg/dL — AB (ref 0.44–1.00)
Chloride: 104 mmol/L (ref 101–111)
GFR, EST AFRICAN AMERICAN: 24 mL/min — AB (ref >60)
GFR, EST NON AFRICAN AMERICAN: 21 mL/min — AB (ref >60)
Glucose, Bld: 160 mg/dL — ABNORMAL HIGH (ref 65–99)
Potassium: 3.8 mmol/L (ref 3.5–5.1)
SODIUM: 136 mmol/L (ref 135–145)

## 2015-02-15 LAB — DIGOXIN LEVEL: Digoxin Level: 1 ng/mL (ref 0.8–2.0)

## 2015-02-15 MED ORDER — DIGOXIN 62.5 MCG PO TABS: 0.0625 mg | ORAL_TABLET | Freq: Every day | ORAL | Status: DC

## 2015-02-15 MED ORDER — METOPROLOL SUCCINATE ER 100 MG PO TB24: 100.0000 mg | ORAL_TABLET | Freq: Two times a day (BID) | ORAL | Status: DC

## 2015-02-15 MED ORDER — OXYCODONE-ACETAMINOPHEN 5-325 MG PO TABS: 1.0000 | ORAL_TABLET | Freq: Four times a day (QID) | ORAL | Status: DC | PRN

## 2015-02-15 MED ORDER — CALCIUM CARBONATE ANTACID 500 MG PO CHEW: 1.0000 | CHEWABLE_TABLET | Freq: Every day | ORAL | Status: DC

## 2015-02-15 MED ORDER — AMIODARONE HCL 200 MG PO TABS: 200.0000 mg | ORAL_TABLET | Freq: Every day | ORAL | Status: DC

## 2015-02-15 NOTE — Clinical Social Work Placement (Signed)
   CLINICAL SOCIAL WORK PLACEMENT  NOTE  Date:  02/15/2015  Patient Details  Name: [name] MRN: [id number] Date of Birth: 11-05-1950  Clinical Social Work is seeking post-discharge placement for this patient at the [place] level of care (*CSW will initial, date and re-position this form in  chart as items are completed):  Yes   Patient/family provided with [place] Work Department's list of facilities offering this level of care within the geographic area requested by the patient (or if unable, by the patient's family).  Yes   Patient/family informed of their freedom to choose among providers that offer the needed level of care, that participate in Medicare, Medicaid or managed care program needed by the patient, have an available bed and are willing to accept the patient.  Yes   Patient/family informed of 's ownership interest in [facility] and [facility], as well as of the fact that they are under no obligation to receive care at these facilities.  PASRR submitted to EDS on       PASRR number received on       Existing PASRR number confirmed on 02/13/15     FL2 transmitted to all facilities in geographic area requested by pt/family on 02/13/15     FL2 transmitted to all facilities within larger geographic area on       Patient informed that his/her managed care company has contracts with or will negotiate with certain facilities, including the following:        Yes   Patient/family informed of bed offers received.  Patient chooses bed at  ([facility])     Physician recommends and patient chooses bed at      Patient to be transferred to  ([facility]) on 02/15/15.  Patient to be transferred to facility by  [facility])     Patient family notified on 02/15/15 of transfer.  Name of family member notified:   Advertising account planner (daughter) )     PHYSICIAN       Additional Comment:     _______________________________________________ Baldemar Lenis, LCSW 02/15/2015, 3:41 PM

## 2015-02-15 NOTE — Progress Notes (Addendum)
[place] at [place] NAME: [name]    MR#:  [id number]  DATE OF BIRTH:  Sep 11, 1950  SUBJECTIVE:  CHIEF COMPLAINT:   Chief Complaint  Patient presents with  . Abdominal Pain   Doing well. Denies any complaints today. Resting well. Ostomy is working, wants to be discharged to skilled nursing care to get strong  REVIEW OF SYSTEMS:   Review of Systems  Constitutional: Negative for fever.  Respiratory: Negative for shortness of breath.   Cardiovascular: Negative for chest pain and palpitations.  Gastrointestinal: Positive for nausea. Negative for vomiting and abdominal pain.  Genitourinary: Negative for dysuria.    DRUG ALLERGIES:  No Known Allergies  VITALS:  Blood pressure 145/97, pulse 79, temperature 99.1 F (37.3 C), temperature source Oral, resp. rate 18, height 5\' 3"  (1.6 m), weight 110.587 kg (243 lb 12.8 oz), SpO2 95 %.  PHYSICAL EXAMINATION:  GENERAL:  64 y.o.-year-old patient lying in the bed with no acute distress. Obese.  LUNGS: Normal breath sounds bilaterally, no wheezing, rales,rhonchi or crepitation. No use of accessory muscles of respiration.  CARDIOVASCULAR: S1, S2 normal. No murmurs, rubs, or gallops. tachycardic ABDOMEN: Soft, some tenderness in lower quadrants, ostomy in left lower quadrant with green/brown output, nondistended.  Positive bowel sounds EXTREMITIES: No pedal edema, cyanosis, or clubbing.  NEUROLOGIC: Cranial nerves II through XII are intact. Muscle strength 5/5 in all extremities. Sensation intact. Gait not checked.  PSYCHIATRIC: The patient is alert and oriented x 3.  SKIN: No obvious rash, lesion, or ulcer.    LABORATORY PANEL:   CBC  Recent Labs Lab 02/14/15 0425  WBC 8.4  HGB 8.7*  HCT 26.9*  PLT 288   ------------------------------------------------------------------------------------------------------------------  Chemistries   Recent Labs Lab 02/13/15 0413   02/15/15 0404  NA 138  < > 136  K 3.0*  < > 3.8  CL 103  < > 104  CO2 27  < > 24  GLUCOSE 148*  < > 160*  BUN 33*  < > 29*  CREATININE 2.63*  < > 2.33*  CALCIUM 6.2*  < > 6.1*  AST 13*  --   --   ALT 11*  --   --   ALKPHOS 87  --   --   BILITOT 0.4  --   --   < > = values in this interval not displayed. ------------------------------------------------------------------------------------------------------------------  Cardiac Enzymes No results for input(s): TROPONINI in the last 168 hours. ------------------------------------------------------------------------------------------------------------------  RADIOLOGY:  No results found.  EKG:   Orders placed or performed during the hospital encounter of 02/04/15  . ED EKG  . ED EKG  . EKG 12-Lead  . EKG 12-Lead    ASSESSMENT AND PLAN:    #1 small bowel obstruction, possible incarceration of small bowel in the periostomal hernia: - Status post surgical reduction of hernia on 12/19 - Good ostomy output , no pain  #2 diabetes mellitus type 2: - hemoglobin A1c is 9.2, poor control in outpatient setting - Continue SSI, has not required much insulin at all  #3 hypertension ,blood pressure is elevated - Formerly hypotensive so have held off on antihypertensives - Continue metoprolol dose increased to 75 mg by mouth twice a day,, restarted Avapro  #4 hypokalemia - Formerly hyperkalemic, now low, replace and monitor  #5 acute kidney failure on chronic kidney disease stage [id number] to ATN  improving-creatinine at 2.63-->2.44 today - appreciate nephrology consultation - likely due to hypotension and IV contrast   #  6 hypothyroidism -  TSH slightly elevated at 4.5, could be from acute phase reaction ,continue Synthroid Repeat thyroid function tests outpatient  #7 hyperlipidemia - Hold statin for now  #8 tachycardia: Improved - Was likely due to hypotension. Was on amiodarone drip, transitioned to oral, digoxin -  [facility] cardiology-outpatient follow-up in a week  #9 hypo calcemia Tums once daily  9. Deconditioning - PT has recommended SNF , getting discharged to skilled nursing care today We will sign off   All the records are reviewed and case discussed with Care Management/Social Workerr. Management plans discussed with the patient, family and they are in agreement.  CODE STATUS: Full   TOTAL TIME TAKING CARE OF THIS PATIENT: 35 minutes.  Greater than 50% of time spent in care coordination and counseling.   Nicholes Mango M.D on 02/15/2015 at 2:55 PM  Between 7am to 6pm - Pager - [phone]  After 6pm go to [email or web address] - password [email or web address] [facility]  [place] [facility]  Office  [phone]  CC: Primary care physician; Adin Hector, MD

## 2015-02-15 NOTE — Progress Notes (Signed)
Packet has been prepared by social work. Patient going to room 127B at [facility]. Daughter has been called and updated. PICC line removed. Patient dressed and ready. AVS printed and given to patient to take home. Tele will be removed with EMS arrives. They have been called for transport. Attempted twice to call report. Will try again before EMS arrives.

## 2015-02-15 NOTE — Progress Notes (Addendum)
EMS has arrived. Able to get in touch with [facility] and give report to [place]. Tele has been removed. Belongings sent with patient. Daughter called to notify of transport.

## 2015-02-15 NOTE — Discharge Summary (Signed)
Physician Discharge Summary  Patient ID: [name] MRN: [id number] DOB/AGE: May 20, 1950 64 y.o.  Admit date: 02/04/2015 Discharge date: 02/15/2015  Admission Diagnoses:Small bowel obstruction  Discharge Diagnoses:  Active Problems:   Small bowel obstruction (HCC) Atrial fibulation Acute renal failure Acidosis  Diabetes mellitus  Obesity  Discharged Condition: fair  Hospital Course: 64 yr old female with incarcerated parastomal hernia causing small bowel obstruction.  She was taken to the OR on 12/19 by Dr. Sherri Rad for Exploratory Laparotomy with repair of parastomal hernia.  She was very ill and had acute renal failure, acidosis, sepsis and a fib.  She had management of this by Cardiology, nephrology and medical management of other issues by the hospitalist service. Her renal function has much improved. She has been able to eat and had good output from the ostomy.  She has some superficial sloughing of the skin around the left lateral ostomy site, which will have some denuding of tissue before it improves.  It does not look infected but rather is a result of the trauma to the area from the parastomal hernia.   Dry dressings this area are all that is needed.  Ostomy care as before operation.  She has been working with physical therapy as well as she is deconditioned and need assistance to walk.  Physical therapy has recommended she go to SNF placement.    Consults: cardiology, nephrology and medicine   Treatments: surgery: Ex Lap, repair of parastomal hernia  Discharge Exam: Blood pressure 145/97, pulse 79, temperature 99.1 F (37.3 C), temperature source Oral, resp. rate 18, height 5\' 3"  (1.6 m), weight 243 lb 12.8 oz (110.587 kg), SpO2 95 %. General appearance: alert, cooperative and no distress Resp: clear to auscultation bilaterally Cardio: regular rate and rhymthm at this time GI: soft, midline wound healing well,  no erythema or edema.  Ostomy with some bruising but  otherwise pink and patent in interior section with good stool and air in the bag, functioning well.  Also with would lateral to ostomy site with superifical necrolysis of tissue and some blistering and some serous drainage from wound, no acute infection, fluctuance or signs of infection Extremities: 2+ pulses, no edema  Disposition:   Discharge Instructions    Call MD for:  persistant nausea and vomiting    Complete by:  As directed      Call MD for:  redness, tenderness, or signs of infection (pain, swelling, redness, odor or green/yellow discharge around incision site)    Complete by:  As directed      Call MD for:  severe uncontrolled pain    Complete by:  As directed      Call MD for:  temperature >100.4    Complete by:  As directed      Diet Carb Modified    Complete by:  As directed      Discharge instructions    Complete by:  As directed   Dry dressings to left of ostomy wound twice daily as needed for drainage, if no drainage can stop dressings.  Ostomy care as needed     Discharge wound care:    Complete by:  As directed   Dry dressing to left of ostomy wound twice daily as needed for drainage, if no drainage do not have to place dressing Ostomy care as needed     Increase activity slowly    Complete by:  As directed      Lifting restrictions    Complete  by:  As directed   No lifting over 15 lbs for 4 weeks     May shower / Bathe    Complete by:  As directed      May walk up steps    Complete by:  As directed      Walk with assistance    Complete by:  As directed             Medication List    STOP taking these medications        sodium phosphate 7-19 GM/118ML Enem      TAKE these medications        amiodarone 200 MG tablet  Commonly known as:  PACERONE  Take 1 tablet (200 mg total) by mouth daily.     atorvastatin 40 MG tablet  Commonly known as:  LIPITOR  Take 40 mg by mouth at bedtime.     bisacodyl 5 MG EC tablet  Commonly known as:  DULCOLAX   Take 4 tablets (20 mg total) by mouth once. Please see surgery prep instructions     clobetasol cream 0.05 %  Commonly known as:  TEMOVATE  Apply 1 application topically 2 (two) times daily as needed (rash).     Digoxin 62.5 MCG Tabs  Take 0.0625 mg by mouth daily.     DULoxetine 30 MG capsule  Commonly known as:  CYMBALTA  Take 30 mg by mouth daily.     glimepiride 4 MG tablet  Commonly known as:  AMARYL  Take 4 mg by mouth 2 (two) times daily.     levothyroxine 88 MCG tablet  Commonly known as:  SYNTHROID, LEVOTHROID  Take 88 mcg by mouth daily before breakfast.     metoprolol succinate 100 MG 24 hr tablet  Commonly known as:  TOPROL-XL  Take 1 tablet (100 mg total) by mouth 2 (two) times daily.     nystatin 100000 UNIT/GM Powd  Apply 1 Tube topically 2 (two) times daily as needed (rash).     oxyCODONE-acetaminophen 5-325 MG tablet  Commonly known as:  PERCOCET/ROXICET  Take 1 tablet by mouth every 6 (six) hours as needed for moderate pain (for pain).     polyethylene glycol powder powder  Commonly known as:  GLYCOLAX/MIRALAX  Take 255 g by mouth once. Please see surgery prep instructions given at appointment     telmisartan 80 MG tablet  Commonly known as:  MICARDIS  Take 80 mg by mouth daily.           Follow-up Information    Follow up with [facility].   Specialty:  [place] information:   [address] [place] [phone]      Follow up with Tama High III, MD. Schedule an appointment as soon as possible for a visit in 1 week.   Specialty:  Internal Medicine   Why:  managment of chronic medical issues   Contact information:   [address] [place] [address] [phone]       Follow up with [facility]. Schedule an appointment as soon as possible for a visit in 2 weeks.   Why:  For wound re-check   Contact information:    [address]      Signed: Hubbard Robinson 02/15/2015, 1:37 PM

## 2015-02-15 NOTE — Progress Notes (Signed)
Clinical Social Worker was informed that patient will be medically ready to discharge to [facility] today. CSW met with patient at bedside and patient is in a agreement with plan. CSW called [facility] at [facility] to confirm that patient's bed is ready.  All discharge information faxed to Facility via [facility]. Rx's added to discharge packet. Health Team Auth received auth # [id number].   CSW called patient's daughter, Luetta Nutting and made her aware of above. RN will call report and arrange EMS for transport.    Ernest Pine, MSW, [facility] Social Work Department [phone]

## 2015-02-15 NOTE — Progress Notes (Signed)
MD, [place] aware of calcium level of 6.1 this morning. Per MD, Morning doctors to address. Will continue to monitor.

## 2015-02-15 NOTE — Care Management Important Message (Signed)
Important Message  Patient Details  Name: [name] MRN: [id number] Date of Birth: 22-Feb-1950   Medicare Important Message Given:  Yes    Katrina Stack, RN 02/15/2015, 2:23 PM

## 2015-02-16 ENCOUNTER — Telehealth: Payer: Self-pay

## 2015-02-16 ENCOUNTER — Other Ambulatory Visit: Payer: Self-pay

## 2015-02-16 DIAGNOSIS — M1990 Unspecified osteoarthritis, unspecified site: Secondary | ICD-10-CM | POA: Insufficient documentation

## 2015-02-16 DIAGNOSIS — K922 Gastrointestinal hemorrhage, unspecified: Secondary | ICD-10-CM | POA: Insufficient documentation

## 2015-02-16 DIAGNOSIS — K5792 Diverticulitis of intestine, part unspecified, without perforation or abscess without bleeding: Secondary | ICD-10-CM | POA: Insufficient documentation

## 2015-02-16 DIAGNOSIS — D649 Anemia, unspecified: Secondary | ICD-10-CM | POA: Insufficient documentation

## 2015-02-16 DIAGNOSIS — N051 Unspecified nephritic syndrome with focal and segmental glomerular lesions: Secondary | ICD-10-CM | POA: Insufficient documentation

## 2015-02-16 DIAGNOSIS — L409 Psoriasis, unspecified: Secondary | ICD-10-CM | POA: Insufficient documentation

## 2015-02-16 DIAGNOSIS — E034 Atrophy of thyroid (acquired): Secondary | ICD-10-CM | POA: Insufficient documentation

## 2015-02-16 DIAGNOSIS — N186 End stage renal disease: Secondary | ICD-10-CM | POA: Insufficient documentation

## 2015-02-16 DIAGNOSIS — N184 Chronic kidney disease, stage 4 (severe): Secondary | ICD-10-CM

## 2015-02-16 NOTE — Telephone Encounter (Signed)
Post discharge call to patient made at this time. I was notified that patient is at [facility] currently for rehab post-hospitalization.

## 2015-02-21 ENCOUNTER — Encounter
Admission: EM | Disposition: A | Discharge status: Home Health | Payer: Self-pay | Source: Home / Self Care | Attending: General Surgery

## 2015-02-21 ENCOUNTER — Inpatient Hospital Stay
Admission: EM | Admit: 2015-02-21 | Discharge: 2015-03-13 | DRG: 857 | Disposition: A | Discharge status: Home Health | Payer: PPO | Attending: Internal Medicine | Admitting: Internal Medicine

## 2015-02-21 ENCOUNTER — Encounter: Payer: Self-pay | Admitting: Emergency Medicine

## 2015-02-21 ENCOUNTER — Inpatient Hospital Stay: Payer: PPO | Admitting: Anesthesiology

## 2015-02-21 ENCOUNTER — Telehealth: Payer: Self-pay

## 2015-02-21 ENCOUNTER — Inpatient Hospital Stay: Payer: PPO

## 2015-02-21 ENCOUNTER — Emergency Department: Payer: PPO

## 2015-02-21 DIAGNOSIS — I350 Nonrheumatic aortic (valve) stenosis: Secondary | ICD-10-CM | POA: Diagnosis present

## 2015-02-21 DIAGNOSIS — I96 Gangrene, not elsewhere classified: Secondary | ICD-10-CM | POA: Diagnosis present

## 2015-02-21 DIAGNOSIS — Z79899 Other long term (current) drug therapy: Secondary | ICD-10-CM | POA: Diagnosis not present

## 2015-02-21 DIAGNOSIS — E876 Hypokalemia: Secondary | ICD-10-CM | POA: Diagnosis present

## 2015-02-21 DIAGNOSIS — K435 Parastomal hernia without obstruction or  gangrene: Secondary | ICD-10-CM | POA: Diagnosis present

## 2015-02-21 DIAGNOSIS — Z7984 Long term (current) use of oral hypoglycemic drugs: Secondary | ICD-10-CM | POA: Diagnosis not present

## 2015-02-21 DIAGNOSIS — E039 Hypothyroidism, unspecified: Secondary | ICD-10-CM | POA: Diagnosis present

## 2015-02-21 DIAGNOSIS — I129 Hypertensive chronic kidney disease with stage 1 through stage 4 chronic kidney disease, or unspecified chronic kidney disease: Secondary | ICD-10-CM | POA: Diagnosis present

## 2015-02-21 DIAGNOSIS — E1140 Type 2 diabetes mellitus with diabetic neuropathy, unspecified: Secondary | ICD-10-CM | POA: Diagnosis present

## 2015-02-21 DIAGNOSIS — Z9049 Acquired absence of other specified parts of digestive tract: Secondary | ICD-10-CM | POA: Diagnosis not present

## 2015-02-21 DIAGNOSIS — D500 Iron deficiency anemia secondary to blood loss (chronic): Secondary | ICD-10-CM | POA: Diagnosis present

## 2015-02-21 DIAGNOSIS — Z888 Allergy status to other drugs, medicaments and biological substances status: Secondary | ICD-10-CM | POA: Diagnosis not present

## 2015-02-21 DIAGNOSIS — Z833 Family history of diabetes mellitus: Secondary | ICD-10-CM

## 2015-02-21 DIAGNOSIS — T814XXD Infection following a procedure, subsequent encounter: Secondary | ICD-10-CM | POA: Diagnosis not present

## 2015-02-21 DIAGNOSIS — T8149XA Infection following a procedure, other surgical site, initial encounter: Secondary | ICD-10-CM | POA: Diagnosis present

## 2015-02-21 DIAGNOSIS — Z794 Long term (current) use of insulin: Secondary | ICD-10-CM | POA: Diagnosis not present

## 2015-02-21 DIAGNOSIS — Z90710 Acquired absence of both cervix and uterus: Secondary | ICD-10-CM | POA: Diagnosis not present

## 2015-02-21 DIAGNOSIS — Z95828 Presence of other vascular implants and grafts: Secondary | ICD-10-CM

## 2015-02-21 DIAGNOSIS — I471 Supraventricular tachycardia: Secondary | ICD-10-CM | POA: Diagnosis present

## 2015-02-21 DIAGNOSIS — M1990 Unspecified osteoarthritis, unspecified site: Secondary | ICD-10-CM | POA: Diagnosis present

## 2015-02-21 DIAGNOSIS — T8189XA Other complications of procedures, not elsewhere classified, initial encounter: Secondary | ICD-10-CM | POA: Diagnosis present

## 2015-02-21 DIAGNOSIS — T814XXA Infection following a procedure, initial encounter: Secondary | ICD-10-CM | POA: Diagnosis present

## 2015-02-21 DIAGNOSIS — I4891 Unspecified atrial fibrillation: Secondary | ICD-10-CM | POA: Diagnosis present

## 2015-02-21 DIAGNOSIS — L02211 Cutaneous abscess of abdominal wall: Secondary | ICD-10-CM | POA: Diagnosis present

## 2015-02-21 DIAGNOSIS — Z87891 Personal history of nicotine dependence: Secondary | ICD-10-CM | POA: Diagnosis not present

## 2015-02-21 DIAGNOSIS — Z6841 Body Mass Index (BMI) 40.0 and over, adult: Secondary | ICD-10-CM

## 2015-02-21 DIAGNOSIS — Z8249 Family history of ischemic heart disease and other diseases of the circulatory system: Secondary | ICD-10-CM

## 2015-02-21 DIAGNOSIS — E785 Hyperlipidemia, unspecified: Secondary | ICD-10-CM | POA: Diagnosis present

## 2015-02-21 DIAGNOSIS — F419 Anxiety disorder, unspecified: Secondary | ICD-10-CM | POA: Diagnosis present

## 2015-02-21 DIAGNOSIS — N184 Chronic kidney disease, stage 4 (severe): Secondary | ICD-10-CM | POA: Diagnosis present

## 2015-02-21 DIAGNOSIS — Z933 Colostomy status: Secondary | ICD-10-CM | POA: Diagnosis not present

## 2015-02-21 DIAGNOSIS — L03311 Cellulitis of abdominal wall: Secondary | ICD-10-CM | POA: Diagnosis present

## 2015-02-21 LAB — CBC WITH DIFFERENTIAL/PLATELET
Basophils Absolute: 0.1 10*3/uL (ref 0–0.1)
Basophils Relative: 1 %
EOS PCT: 2 %
Eosinophils Absolute: 0.2 10*3/uL (ref 0–0.7)
HEMATOCRIT: 28 % — AB (ref 35.0–47.0)
Hemoglobin: 8.8 g/dL — ABNORMAL LOW (ref 12.0–16.0)
LYMPHS ABS: 0.7 10*3/uL — AB (ref 1.0–3.6)
Lymphocytes Relative: 7 %
MCH: 25.8 pg — ABNORMAL LOW (ref 26.0–34.0)
MCHC: 31.4 g/dL — AB (ref 32.0–36.0)
MCV: 82 fL (ref 80.0–100.0)
MONO ABS: 0.5 10*3/uL (ref 0.2–0.9)
Monocytes Relative: 6 %
NEUTROS ABS: 7.9 10*3/uL — AB (ref 1.4–6.5)
NEUTROS PCT: 84 %
Platelets: 366 10*3/uL (ref 150–440)
RBC: 3.41 MIL/uL — ABNORMAL LOW (ref 3.80–5.20)
RDW: 15.1 % — AB (ref 11.5–14.5)
WBC: 9.3 10*3/uL (ref 3.6–11.0)

## 2015-02-21 LAB — COMPREHENSIVE METABOLIC PANEL
ALBUMIN: 2.4 g/dL — AB (ref 3.5–5.0)
ALT: 12 U/L — ABNORMAL LOW (ref 14–54)
ANION GAP: 10 (ref 5–15)
AST: 12 U/L — AB (ref 15–41)
Alkaline Phosphatase: 88 U/L (ref 38–126)
BILIRUBIN TOTAL: 0.6 mg/dL (ref 0.3–1.2)
BUN: 23 mg/dL — AB (ref 6–20)
CHLORIDE: 105 mmol/L (ref 101–111)
CO2: 25 mmol/L (ref 22–32)
Calcium: 6.5 mg/dL — ABNORMAL LOW (ref 8.9–10.3)
Creatinine, Ser: 1.99 mg/dL — ABNORMAL HIGH (ref 0.44–1.00)
GFR calc Af Amer: 29 mL/min — ABNORMAL LOW (ref >60)
GFR calc non Af Amer: 25 mL/min — ABNORMAL LOW (ref >60)
GLUCOSE: 134 mg/dL — AB (ref 65–99)
POTASSIUM: 4.3 mmol/L (ref 3.5–5.1)
SODIUM: 140 mmol/L (ref 135–145)
TOTAL PROTEIN: 7 g/dL (ref 6.5–8.1)

## 2015-02-21 LAB — URINALYSIS COMPLETE WITH MICROSCOPIC (ARMC ONLY)
BILIRUBIN URINE: NEGATIVE
GLUCOSE, UA: NEGATIVE mg/dL
Ketones, ur: NEGATIVE mg/dL
Leukocytes, UA: NEGATIVE
Nitrite: NEGATIVE
Protein, ur: 100 mg/dL — AB
Specific Gravity, Urine: 1.006 (ref 1.005–1.030)
pH: 6 (ref 5.0–8.0)

## 2015-02-21 LAB — LACTIC ACID, PLASMA
Lactic Acid, Venous: 0.8 mmol/L (ref 0.5–2.0)
Lactic Acid, Venous: 0.8 mmol/L (ref 0.5–2.0)

## 2015-02-21 SURGERY — LAPAROTOMY, EXPLORATORY
Anesthesia: Choice

## 2015-02-21 MED ORDER — ONDANSETRON 4 MG PO TBDP: 4.0000 mg | ORAL_TABLET | Freq: Four times a day (QID) | ORAL | Status: DC | PRN

## 2015-02-21 MED ORDER — METOPROLOL SUCCINATE ER 100 MG PO TB24
100.0000 mg | ORAL_TABLET | Freq: Two times a day (BID) | ORAL | Status: DC
  Administered 2015-02-21 – 2015-02-22 (×3): 100 mg via ORAL
  Filled 2015-02-21 (×4): qty 1

## 2015-02-21 MED ORDER — DIPHENHYDRAMINE HCL 12.5 MG/5ML PO ELIX
12.5000 mg | ORAL_SOLUTION | Freq: Four times a day (QID) | ORAL | Status: DC | PRN
  Filled 2015-02-21: qty 5

## 2015-02-21 MED ORDER — AMIODARONE HCL 200 MG PO TABS
200.0000 mg | ORAL_TABLET | Freq: Every day | ORAL | Status: DC
  Administered 2015-02-22 – 2015-03-13 (×19): 200 mg via ORAL
  Filled 2015-02-21 (×21): qty 1

## 2015-02-21 MED ORDER — LACTATED RINGERS IV SOLN
INTRAVENOUS | Status: DC
  Administered 2015-02-21 – 2015-02-22 (×2): via INTRAVENOUS

## 2015-02-21 MED ORDER — PIPERACILLIN-TAZOBACTAM 3.375 G IVPB
3.3750 g | Freq: Three times a day (TID) | INTRAVENOUS | Status: DC
  Administered 2015-02-21 – 2015-03-06 (×39): 3.375 g via INTRAVENOUS
  Filled 2015-02-21 (×47): qty 50

## 2015-02-21 MED ORDER — DULOXETINE HCL 30 MG PO CPEP
30.0000 mg | ORAL_CAPSULE | Freq: Every day | ORAL | Status: DC
Start: 2015-02-21 — End: 2015-03-13
  Administered 2015-02-23 – 2015-03-13 (×18): 30 mg via ORAL
  Filled 2015-02-21 (×19): qty 1

## 2015-02-21 MED ORDER — ENOXAPARIN SODIUM 40 MG/0.4ML ~~LOC~~ SOLN: 40.0000 mg | Freq: Two times a day (BID) | SUBCUTANEOUS | Status: DC

## 2015-02-21 MED ORDER — HYDROMORPHONE HCL 1 MG/ML IJ SOLN
1.0000 mg | INTRAMUSCULAR | Status: DC | PRN
Start: 2015-02-21 — End: 2015-03-10
  Administered 2015-02-21 – 2015-03-10 (×68): 1 mg via INTRAVENOUS
  Filled 2015-02-21 (×69): qty 1

## 2015-02-21 MED ORDER — ONDANSETRON HCL 4 MG/2ML IJ SOLN
4.0000 mg | Freq: Four times a day (QID) | INTRAMUSCULAR | Status: DC | PRN
  Administered 2015-02-24: 4 mg via INTRAVENOUS

## 2015-02-21 MED ORDER — IRBESARTAN 150 MG PO TABS
300.0000 mg | ORAL_TABLET | Freq: Every day | ORAL | Status: DC
  Administered 2015-02-22 – 2015-03-13 (×19): 300 mg via ORAL
  Filled 2015-02-21 (×20): qty 2

## 2015-02-21 MED ORDER — SODIUM CHLORIDE 0.9 % IV BOLUS (SEPSIS)
2000.0000 mL | Freq: Once | INTRAVENOUS | Status: AC
  Administered 2015-03-03: 09:00:00 via INTRAVENOUS
  Administered 2015-03-03: 250 mL via INTRAVENOUS

## 2015-02-21 MED ORDER — PANTOPRAZOLE SODIUM 40 MG IV SOLR
40.0000 mg | Freq: Every day | INTRAVENOUS | Status: DC
  Administered 2015-02-21 – 2015-02-26 (×6): 40 mg via INTRAVENOUS
  Filled 2015-02-21 (×6): qty 40

## 2015-02-21 MED ORDER — DIGOXIN 125 MCG PO TABS
0.0625 mg | ORAL_TABLET | Freq: Every day | ORAL | Status: DC
  Administered 2015-02-23 – 2015-03-13 (×18): 0.0625 mg via ORAL
  Filled 2015-02-21 (×17): qty 1
  Filled 2015-02-21: qty 5
  Filled 2015-02-21 (×2): qty 1

## 2015-02-21 MED ORDER — GLIMEPIRIDE 2 MG PO TABS
4.0000 mg | ORAL_TABLET | Freq: Two times a day (BID) | ORAL | Status: DC
  Administered 2015-02-21: 4 mg via ORAL
  Filled 2015-02-21: qty 2

## 2015-02-21 MED ORDER — VANCOMYCIN HCL 10 G IV SOLR
1500.0000 mg | Freq: Once | INTRAVENOUS | Status: AC
  Administered 2015-03-03: 1500 mg via INTRAVENOUS
  Filled 2015-02-21 (×2): qty 1500

## 2015-02-21 MED ORDER — HYDROMORPHONE HCL 1 MG/ML IJ SOLN
1.0000 mg | Freq: Once | INTRAMUSCULAR | Status: AC
  Administered 2015-02-21: 1 mg via INTRAVENOUS
  Filled 2015-02-21: qty 1

## 2015-02-21 MED ORDER — LEVOTHYROXINE SODIUM 88 MCG PO TABS
88.0000 ug | ORAL_TABLET | ORAL | Status: DC
  Administered 2015-02-22 – 2015-03-13 (×19): 88 ug via ORAL
  Filled 2015-02-21 (×21): qty 1

## 2015-02-21 MED ORDER — PIPERACILLIN-TAZOBACTAM 3.375 G IVPB
3.3750 g | Freq: Once | INTRAVENOUS | Status: AC
  Administered 2015-02-21: 3.375 g via INTRAVENOUS
  Filled 2015-02-21: qty 50

## 2015-02-21 MED ORDER — PIPERACILLIN-TAZOBACTAM 3.375 G IVPB
3.3750 g | Freq: Three times a day (TID) | INTRAVENOUS | Status: DC
  Filled 2015-02-21: qty 50

## 2015-02-21 MED ORDER — DIPHENHYDRAMINE HCL 50 MG/ML IJ SOLN
12.5000 mg | Freq: Four times a day (QID) | INTRAMUSCULAR | Status: DC | PRN
  Administered 2015-03-11: 12.5 mg via INTRAVENOUS
  Filled 2015-02-21 (×2): qty 1

## 2015-02-21 MED ORDER — ATORVASTATIN CALCIUM 20 MG PO TABS
40.0000 mg | ORAL_TABLET | Freq: Every day | ORAL | Status: DC
  Administered 2015-02-21 – 2015-03-12 (×20): 40 mg via ORAL
  Filled 2015-02-21 (×22): qty 2

## 2015-02-21 MED ORDER — IOHEXOL 240 MG/ML SOLN
50.0000 mL | INTRAMUSCULAR | Status: AC
  Administered 2015-02-21 (×2): 50 mL via ORAL

## 2015-02-21 MED ORDER — OXYCODONE-ACETAMINOPHEN 5-325 MG PO TABS
1.0000 | ORAL_TABLET | Freq: Four times a day (QID) | ORAL | Status: DC | PRN
Start: 2015-02-21 — End: 2015-03-11
  Administered 2015-02-21 – 2015-03-11 (×36): 1 via ORAL
  Filled 2015-02-21 (×37): qty 1

## 2015-02-21 MED ORDER — HYDRALAZINE HCL 20 MG/ML IJ SOLN: 10.0000 mg | INTRAMUSCULAR | Status: DC | PRN

## 2015-02-21 SURGICAL SUPPLY — 28 items
CANISTER SUCT 1200ML W/VALVE (MISCELLANEOUS) ×3 IMPLANT
CANISTER SUCT 3000ML (MISCELLANEOUS) IMPLANT
CATH TRAY 16F METER LATEX (MISCELLANEOUS) IMPLANT
CHLORAPREP W/TINT 26ML (MISCELLANEOUS) IMPLANT
DRAPE LAPAROTOMY 100X77 ABD (DRAPES) ×3 IMPLANT
DRAPE SHEET LG 3/4 BI-LAMINATE (DRAPES) IMPLANT
DRAPE UTILITY 15X26 TOWEL STRL (DRAPES) ×6 IMPLANT
ELECT CAUTERY BLADE 6.4 (BLADE) IMPLANT
GAUZE SPONGE 4X4 12PLY STRL (GAUZE/BANDAGES/DRESSINGS) IMPLANT
GLOVE BIO SURGEON STRL SZ7.5 (GLOVE) ×9 IMPLANT
GOWN STRL REUS W/ TWL LRG LVL3 (GOWN DISPOSABLE) ×1 IMPLANT
GOWN STRL REUS W/ TWL XL LVL3 (GOWN DISPOSABLE) ×1 IMPLANT
GOWN STRL REUS W/TWL LRG LVL3 (GOWN DISPOSABLE) ×2
GOWN STRL REUS W/TWL XL LVL3 (GOWN DISPOSABLE) ×2
KIT RM TURNOVER STRD PROC AR (KITS) ×3 IMPLANT
LABEL OR SOLS (LABEL) IMPLANT
LIGASURE MARYLAND LAP STAND (ELECTROSURGICAL) IMPLANT
NS IRRIG 1000ML POUR BTL (IV SOLUTION) ×3 IMPLANT
PACK BASIN MAJOR ARMC (MISCELLANEOUS) IMPLANT
PACK BASIN MINOR ARMC (MISCELLANEOUS) IMPLANT
PAD ABD DERMACEA PRESS 5X9 (GAUZE/BANDAGES/DRESSINGS) IMPLANT
PAD GROUND ADULT SPLIT (MISCELLANEOUS) ×3 IMPLANT
STAPLER SKIN PROX 35W (STAPLE) IMPLANT
SUT CHROMIC 0 CT 1 (SUTURE) IMPLANT
SUT CHROMIC 3 0 SH 27 (SUTURE) IMPLANT
SUT TICRON 2-0 30IN 311381 (SUTURE) IMPLANT
SUT VIC AB 1 CTX 27 (SUTURE) IMPLANT
SUT VICRYL PLUS ABS 0 54 (SUTURE) IMPLANT

## 2015-02-21 NOTE — ED Provider Notes (Signed)
[facility] Emergency Department Provider Note  ____________________________________________  Time seen: 10:05 AM on arrival by EMS  I have reviewed the triage vital signs and the nursing notes.   HISTORY  Chief Complaint Wound Infection    HPI [name] is a 65 y.o. female sent to the ED [facility] for Prelone and drainage from an abdominal wound. According to EMS, staff at [facility] noticed that the left abdominal wall in the area of her previous surgical incision has become increasingly red and inflamed and started draining purulent fluid over the weekend about 3 or 4 days ago. Because the surgery clinic was closed they did nothing at the time. They called surgery clinic this morning and were told to come to the ER. Denies vomiting or diarrhea. Has a colostomy which has had normal stool output. No fever. Complains of diffuse abdominal pain.  Recently had midline abdominal incision for hernia repair.   Past Medical History  Diagnosis Date  . Diabetes mellitus without complication ([place])   . GI bleed   . Aortic valve stenosis   . Hyperlipidemia   . Hypertension   . Diverticulitis large intestine   . Obesity   . Shortness of breath dyspnea   . Hyperthyroidism   . Anxiety   . Arthritis   . Neuropathy in diabetes ([place])   . Anemia   . Vertigo   . Bowel obstruction [facility])      Patient Active Problem List   Diagnosis Date Noted  . Absolute anemia 02/16/2015  . Aortic heart valve narrowing 02/16/2015  . Arthritis 02/16/2015  . Chronic kidney disease (CKD), stage III (moderate) 02/16/2015  . Diabetic neuropathy ([place]) 02/16/2015  . Diverticulitis 02/16/2015  . Focal and segmental hyalinosis 02/16/2015  . Bleeding gastrointestinal 02/16/2015  . Acquired atrophy of thyroid 02/16/2015  . Psoriasis 02/16/2015  . Small bowel obstruction ([place]) 02/04/2015  . Incarcerated hernia   . Paroxysmal supraventricular tachycardia ([place])  10/11/2014  . Breathlessness on exertion 10/11/2014  . Benign essential HTN 10/04/2014  . Diverticulitis of colon 09/05/2014  . Obesity 09/05/2014  . B12 deficiency 03/02/2014  . Type 2 diabetes mellitus ([place]) 12/01/2013  . Open wnd anterior abdomen 10/15/2013  . Abnormal presence of protein in urine 10/15/2010     Past Surgical History  Procedure Laterality Date  . Colectomy    . Laparotomy closure of cecal perforation  05/09/2013    Dr. Marina Gravel  . Tonsillectomy    . Back surgery      spur frmoved from lower back  . Cardiac catheterization    . Abdominal hysterectomy    . Laparotomy N/A 02/06/2015    Procedure: Laparotomy, reduction of incarcerated parastomal hernia, repair of parastomal hernia with mesh;  Surgeon: Sherri Rad, MD;  Location: [facility] ORS;  Service: General;  Laterality: N/A;     Current Outpatient Rx  Name  Route  Sig  Dispense  Refill  . amiodarone (PACERONE) 200 MG tablet   Oral   Take 1 tablet (200 mg total) by mouth daily.   30 tablet   1   . atorvastatin (LIPITOR) 40 MG tablet   Oral   Take 40 mg by mouth at bedtime.      2   . clobetasol cream (TEMOVATE) 0.05 %   Topical   Apply 1 application topically 2 (two) times daily as needed (for rash).       2   . digoxin 62.5 MCG TABS   Oral  Take 0.0625 mg by mouth daily.   30 tablet   1   . DULoxetine (CYMBALTA) 30 MG capsule   Oral   Take 30 mg by mouth daily.       11   . glimepiride (AMARYL) 4 MG tablet   Oral   Take 4 mg by mouth 2 (two) times daily.       3   . levothyroxine (SYNTHROID, LEVOTHROID) 88 MCG tablet   Oral   Take 88 mcg by mouth every morning.       2   . metoprolol succinate (TOPROL-XL) 100 MG 24 hr tablet   Oral   Take 1 tablet (100 mg total) by mouth 2 (two) times daily.   60 tablet   1   . nystatin (MYCOSTATIN/NYSTOP) 100000 UNIT/GM POWD   Topical   Apply topically daily. Under abdominal pannuses      3   . oxyCODONE-acetaminophen (PERCOCET/ROXICET)  5-325 MG tablet   Oral   Take 1 tablet by mouth every 6 (six) hours as needed for moderate pain (for pain). Patient taking differently: Take 1 tablet by mouth every 4 (four) hours.    30 tablet   0   . telmisartan (MICARDIS) 80 MG tablet   Oral   Take 80 mg by mouth daily.         . bisacodyl (DULCOLAX) 5 MG EC tablet   Oral   Take 4 tablets (20 mg total) by mouth once. Please see surgery prep instructions Patient not taking: Reported on 02/04/2015   4 tablet   0   . calcium carbonate (TUMS) 500 MG chewable tablet   Oral   Chew 1 tablet (200 mg of elemental calcium total) by mouth daily. Patient not taking: Reported on 02/21/2015   30 tablet   0   . polyethylene glycol powder (GLYCOLAX/MIRALAX) powder   Oral   Take 255 g by mouth once. Please see surgery prep instructions given at appointment Patient not taking: Reported on 02/04/2015   255 g   0      Allergies Buspirone; Citalopram; Lisinopril; Metformin; Pravastatin; Sitagliptin; Tramadol; Diltiazem hcl; Gabapentin; and Lovastatin   Family History  Problem Relation Age of [place]  . Diabetes Mother   . Hypertension Father     Social History Social History  Substance Use Topics  . Smoking status: Former Smoker -- 0.25 packs/day for 1 years    Types: Cigarettes    Quit date: 04/18/2013  . Smokeless tobacco: Never Used  . Alcohol Use: No    Review of Systems  Constitutional:   No fever or chills. No weight changes Eyes:   No blurry vision or double vision.  ENT:   No sore throat. Cardiovascular:   No chest pain. Respiratory:   No dyspnea or cough. Gastrointestinal:   Positive abdominal pain without vomiting and diarrhea.  No BRBPR or melena. Genitourinary:   Negative for dysuria, urinary retention, bloody urine, or difficulty urinating. Musculoskeletal:   Negative for back pain. No joint swelling or pain. Skin:   Negative for rash. Neurological:   Negative for headaches, focal weakness or  numbness. Psychiatric:  No anxiety or depression.   Endocrine:  No hot/cold intolerance, changes in energy, or sleep difficulty.  10-point ROS otherwise negative.  ____________________________________________   PHYSICAL EXAM:  VITAL SIGNS: ED Triage Vitals  Enc Vitals Group     BP 02/21/15 1010 163/100 mmHg     Pulse Rate 02/21/15 1010 58  Resp 02/21/15 1010 18     Temp 02/21/15 1010 98.2 F (36.8 C)     Temp Source 02/21/15 1010 Oral     SpO2 02/21/15 1010 99 %     Weight 02/21/15 1010 240 lb (108.863 kg)     Height 02/21/15 1010 5\' 3"  (1.6 m)     Head Cir --      [facility] --      Pain Score 02/21/15 1011 7     Pain Loc --      Pain Edu? --      Excl. in [place]? --     Vital signs reviewed, nursing assessments reviewed.   Constitutional:   Alert and oriented. Well appearing and in no distress. Eyes:   No scleral icterus. No conjunctival pallor. PERRL. EOMI ENT   Head:   Normocephalic and atraumatic.   Nose:   No congestion/rhinnorhea. No septal hematoma   Mouth/Throat:   Dry mucous membranes, no pharyngeal erythema. No peritonsillar mass. No uvula shift.   Neck:   No stridor. No SubQ emphysema. No meningismus. Hematological/Lymphatic/Immunilogical:   No cervical lymphadenopathy. Cardiovascular:   RRR. Normal and symmetric distal pulses are present in all extremities. No murmurs, rubs, or gallops. Respiratory:   Normal respiratory effort without tachypnea nor retractions. Breath sounds are clear and equal bilaterally. No wheezes/rales/rhonchi. Gastrointestinal:   Obese with large subcutaneous fat. Diffusely tender with rebound. Midline incision is healing well with only a small 5 mm area under the pannus of persistent open wound that is shallow and healing by secondary intention. There is a left-sided colostomy that is continuing to output brown stool. Lateral to the colostomy on the left side of the abdomen there is a large area approximately 5 x 8 cm of  necrosis and eschar formation of the abdominal wall with purulent drainage and stool drainage. The necrotic tissue is retracting from the surrounding noninflamed tissues. There is undermining and tunneling, but the fascia appears to be intact. The lateral most aspect of the wound is contiguous with a small 2-3 cm surgical incision that is otherwise closed. Genitourinary:   deferred Musculoskeletal:   Nontender with normal range of motion in all extremities. No joint effusions.  No lower extremity tenderness.  No edema. Neurologic:   Normal speech and language.  CN 2-10 normal. Motor grossly intact. No pronator drift.  Normal gait. No gross focal neurologic deficits are appreciated.  Skin:    Skin is warm, dry and intact. No rash noted.  No petechiae, purpura, or bullae. Psychiatric:   Mood and affect are normal. Speech and behavior are normal. Patient exhibits appropriate insight and judgment.  ____________________________________________    LABS (pertinent positives/negatives) (all labs ordered are listed, but only abnormal results are displayed) Labs Reviewed  COMPREHENSIVE METABOLIC PANEL - Abnormal; Notable for the following:    Glucose, Bld 134 (*)    BUN 23 (*)    Creatinine, Ser 1.99 (*)    Calcium 6.5 (*)    Albumin 2.4 (*)    AST 12 (*)    ALT 12 (*)    GFR calc non Af Amer 25 (*)    GFR calc Af Amer 29 (*)    All other components within normal limits  CBC WITH DIFFERENTIAL/PLATELET - Abnormal; Notable for the following:    RBC 3.41 (*)    Hemoglobin 8.8 (*)    HCT 28.0 (*)    MCH 25.8 (*)    MCHC 31.4 (*)  RDW 15.1 (*)    Neutro Abs 7.9 (*)    Lymphs Abs 0.7 (*)    All other components within normal limits  URINALYSIS COMPLETEWITH MICROSCOPIC ([facility] ONLY) - Abnormal; Notable for the following:    Color, Urine YELLOW (*)    APPearance HAZY (*)    Hgb urine dipstick 1+ (*)    Protein, ur 100 (*)    Bacteria, UA RARE (*)    Squamous Epithelial / LPF 0-5 (*)     All other components within normal limits  CULTURE, BLOOD (ROUTINE X 2)  CULTURE, BLOOD (ROUTINE X 2)  URINE CULTURE  LACTIC ACID, PLASMA  LACTIC ACID, PLASMA   ____________________________________________   EKG  Interpreted by me Sinus rhythm rate of 55, normal axis and intervals. Poor R-wave progression in anterior precordial leads. Normal ST segments and T waves  ____________________________________________    RADIOLOGY  CT abdomen and pelvis pending  ____________________________________________   PROCEDURES   ____________________________________________   INITIAL IMPRESSION / ASSESSMENT AND PLAN / ED COURSE  Pertinent labs & imaging results that were available during my care of the patient were reviewed by me and considered in my medical decision making (see chart for details).  Patient presents with a large abdominal wound with necrosis and likely abscess formation in the abdominal wall in an area of a prior surgical incision. There is concern for fistula formation versus colostomy tunneling. Raises concern for bowel perforation. We'll give IV fluids, IV Dilaudid, and. Vancomycin and Zosyn, CT scan of the abdomen pelvis with oral contrast protocol and labs. Case discussed with surgery Dr. Adonis Huguenin who is going into the OR.  Agrees with labs and CT for now.  ----------------------------------------- 12:30 PM on 02/21/2015 -----------------------------------------  Case discussed again with Dr. Adonis Huguenin after his evaluation in the emergency department. Advises that this will likely need surgical debridement. We'll plan to admit for now after CT scan. Continue IV fluids and antibiotics. Pain control as needed. No evidence of sepsis so far. We'll continue to monitor closely.     ____________________________________________   FINAL CLINICAL IMPRESSION(S) / ED DIAGNOSES  Final diagnoses:  Abdominal wall abscess at site of surgical wound      Carrie Mew,  MD 02/21/15 1302

## 2015-02-21 NOTE — Progress Notes (Signed)
ANTIBIOTIC CONSULT NOTE - INITIAL  Pharmacy Consult for Zosyn Indication: Infected Surgical Wound  Allergies  Allergen Reactions  . Buspirone Other (See Comments)    Weakness  . Citalopram Other (See Comments)  . Lisinopril Cough  . Metformin Diarrhea    At 1000 mg dose  . Pravastatin Other (See Comments)    insomnia  . Sitagliptin Other (See Comments)    constipation  . Tramadol Itching  . Diltiazem Hcl Palpitations  . Gabapentin Palpitations  . Lovastatin Palpitations    Patient Measurements: Height: 5\' 3"  (160 cm) Weight: 240 lb (108.863 kg) IBW/kg (Calculated) : 52.4  Vital Signs: Temp: 98 F (36.7 C) (01/03 1423) Temp Source: Oral (01/03 1423) BP: 132/93 mmHg (01/03 1423) Pulse Rate: 78 (01/03 1423) Intake/Output from previous day:   Intake/Output from this shift:    Labs:  Recent Labs  02/21/15 1116  WBC 9.3  HGB 8.8*  PLT 366  CREATININE 1.99*   Estimated Creatinine Clearance: 33.8 mL/min (by C-G formula based on Cr of 1.99). No results for input(s): VANCOTROUGH, VANCOPEAK, VANCORANDOM, GENTTROUGH, GENTPEAK, GENTRANDOM, TOBRATROUGH, TOBRAPEAK, TOBRARND, AMIKACINPEAK, AMIKACINTROU, AMIKACIN in the last 72 hours.   Microbiology: Recent Results (from the past 720 hour(s))  Culture, blood (Routine X 2) w Reflex to ID Panel     Status: None   Collection Time: 02/09/15  5:34 PM  Result Value Ref Range Status   Specimen Description BLOOD LEFT ASSIST CONTROL  Final   Special Requests BOTTLES DRAWN AEROBIC AND ANAEROBIC 1CC  Final   Culture NO GROWTH 6 DAYS  Final   Report Status 02/15/2015 FINAL  Final  Culture, blood (Routine X 2) w Reflex to ID Panel     Status: None   Collection Time: 02/09/15  5:35 PM  Result Value Ref Range Status   Specimen Description BLOOD RIGHT HAND  Final   Special Requests BOTTLES DRAWN AEROBIC AND ANAEROBIC  1CC  Final   Culture NO GROWTH 6 DAYS  Final   Report Status 02/15/2015 FINAL  Final  MRSA PCR Screening      Status: Abnormal   Collection Time: 02/09/15  5:56 PM  Result Value Ref Range Status   MRSA by PCR POSITIVE (A) NEGATIVE Final    Comment: CRITICAL RESULT CALLED TO, READ BACK BY AND VERIFIED WITH: DEL HOPKINS @ 1936 ON 02/09/2015 BY CAF        The GeneXpert MRSA Assay (FDA approved for NASAL specimens only), is one component of a comprehensive MRSA colonization surveillance program. It is not intended to diagnose MRSA infection nor to guide or monitor treatment for MRSA infections.     Medical History: Past Medical History  Diagnosis Date  . Diabetes mellitus without complication ([place])   . GI bleed   . Aortic valve stenosis   . Hyperlipidemia   . Hypertension   . Diverticulitis large intestine   . Obesity   . Shortness of breath dyspnea   . Hyperthyroidism   . Anxiety   . Arthritis   . Neuropathy in diabetes ([place])   . Anemia   . Vertigo   . Bowel obstruction ([facility])     Medications:  Scheduled:  . amiodarone  200 mg Oral Daily  . atorvastatin  40 mg Oral QHS  . digoxin  0.0625 mg Oral Daily  . DULoxetine  30 mg Oral Daily  . enoxaparin (LOVENOX) injection  30 mg Subcutaneous Q24H  . glimepiride  4 mg Oral BID  . irbesartan  300 mg  Oral Daily  . [START ON 02/22/2015] levothyroxine  88 mcg Oral BH-q7a  . metoprolol succinate  100 mg Oral BID  . pantoprazole (PROTONIX) IV  40 mg Intravenous QHS  . piperacillin-tazobactam (ZOSYN)  IV  3.375 g Intravenous Q8H  . sodium chloride  2,000 mL Intravenous Once  . vancomycin  1,500 mg Intravenous Once   Infusions:  . lactated ringers     Assessment: 65 y/o F s/p ex lap 12/19 admitted with infected surgical wound.   Plan:  Zosyn 3.375 g IV once given in ED. Will order Zosyn 3.375 g EI q 8 hours beginning 6 hours after initial dose. Will continue to follow renal function and culture results.   Ulice Dash D 02/21/2015,3:05 PM

## 2015-02-21 NOTE — Progress Notes (Signed)
Patient ID: [name], female   DOB: [birth year], 65 y.o.   MRN: [id number]   Surgery  Case discussed with Dr. Gwyndolyn Saxon. CT scan personally reviewed.  She is now postoperative day #15 status post repair of an incarcerated peristomal hernia with synthetic biological mesh patch.  She had a complicated postoperative course including significant tachycardia. She was readmitted today with purulent drainage from her lateral incision. There is evidence of skin necrosis. CT scan demonstrates no evidence of fascial disruption of the midline. There is a small parastomal hernia containing fat. The colon up into the ostomy site appears unremarkable.   Physical examination: there is a significant amount of abdominal wall necrosis along with foul-smelling purulent discharge. The ostomy itself appears to have superficial necrosis. This will need to be debrided in the operating room under anesthesia with placement of wound VAC if feasible. She understands and wishes to proceed with this course of action.

## 2015-02-21 NOTE — ED Notes (Signed)
Sent in from [facility] with possible wound infection  S/p hernia repair on 02/01/15

## 2015-02-21 NOTE — H&P (Signed)
Patient ID: [name], female   DOB: Nov 26, 1950, 65 y.o.   MRN: [id number]  CC: WOUND INFECTION  HPI [name] is a 65 y.o. female that is well-known to the surgery service presents to the emergency department today with a 1 day history of foul-smelling drainage from her most recent surgical incision site. Patient states that at the rehabilitation facility she was that she was not receiving any wound care. Last night she noticed the dressing come off and a foul drainage prompting them to call the surgery clinic this morning who recommended they report to the emergency room for evaluation. Patient denies any fevers, chills, nausea, vomiting, chest pain, shortness of breath. Her ostomy has continued to have regular function throughout this. Patient is only 2 weekspostop from her most recent surgery which was done for a parastomal hernia.  HPI  Past Medical History  Diagnosis Date  . Diabetes mellitus without complication ([place])   . GI bleed   . Aortic valve stenosis   . Hyperlipidemia   . Hypertension   . Diverticulitis large intestine   . Obesity   . Shortness of breath dyspnea   . Hyperthyroidism   . Anxiety   . Arthritis   . Neuropathy in diabetes ([place])   . Anemia   . Vertigo   . Bowel obstruction [facility])     Past Surgical History  Procedure Laterality Date  . Colectomy    . Laparotomy closure of cecal perforation  05/09/2013    Dr. Marina Gravel  . Tonsillectomy    . Back surgery      spur frmoved from lower back  . Cardiac catheterization    . Abdominal hysterectomy    . Laparotomy N/A 02/06/2015    Procedure: Laparotomy, reduction of incarcerated parastomal hernia, repair of parastomal hernia with mesh;  Surgeon: Sherri Rad, MD;  Location: [facility] ORS;  Service: General;  Laterality: N/A;    Family History  Problem Relation Age of Onset  . Diabetes Mother   . Hypertension Father     Social History Social History  Substance Use Topics  . Smoking status: Former Smoker --  0.25 packs/day for 1 years    Types: Cigarettes    Quit date: 04/18/2013  . Smokeless tobacco: Never Used  . Alcohol Use: No    Allergies  Allergen Reactions  . Buspirone Other (See Comments)    Weakness  . Citalopram Other (See Comments)  . Lisinopril Cough  . Metformin Diarrhea    At 1000 mg dose  . Pravastatin Other (See Comments)    insomnia  . Sitagliptin Other (See Comments)    constipation  . Tramadol Itching  . Diltiazem Hcl Palpitations  . Gabapentin Palpitations  . Lovastatin Palpitations    Current Facility-Administered Medications  Medication Dose Route Frequency Provider Last Rate Last Dose  . vancomycin (VANCOCIN) 1,500 mg in sodium chloride 0.9 % 500 mL IVPB  1,500 mg Intravenous Once Carrie Mew, MD 250 mL/hr at 02/21/15 1155 1,500 mg at 02/21/15 1155   Current Outpatient Prescriptions  Medication Sig Dispense Refill  . amiodarone (PACERONE) 200 MG tablet Take 1 tablet (200 mg total) by mouth daily. 30 tablet 1  . atorvastatin (LIPITOR) 40 MG tablet Take 40 mg by mouth at bedtime.  2  . clobetasol cream (TEMOVATE) [id number] % Apply 1 application topically 2 (two) times daily as needed (for rash).   2  . digoxin 62.5 MCG TABS Take 0.0625 mg by mouth daily. 30 tablet 1  .  DULoxetine (CYMBALTA) 30 MG capsule Take 30 mg by mouth daily.   11  . glimepiride (AMARYL) 4 MG tablet Take 4 mg by mouth 2 (two) times daily.   3  . levothyroxine (SYNTHROID, LEVOTHROID) 88 MCG tablet Take 88 mcg by mouth every morning.   2  . metoprolol succinate (TOPROL-XL) 100 MG 24 hr tablet Take 1 tablet (100 mg total) by mouth 2 (two) times daily. 60 tablet 1  . nystatin (MYCOSTATIN/NYSTOP) 100000 UNIT/GM POWD Apply topically daily. Under abdominal pannuses  3  . oxyCODONE-acetaminophen (PERCOCET/ROXICET) 5-325 MG tablet Take 1 tablet by mouth every 6 (six) hours as needed for moderate pain (for pain). (Patient taking differently: Take 1 tablet by mouth every 4 (four) hours. ) 30 tablet  0  . telmisartan (MICARDIS) 80 MG tablet Take 80 mg by mouth daily.    . bisacodyl (DULCOLAX) 5 MG EC tablet Take 4 tablets (20 mg total) by mouth once. Please see surgery prep instructions (Patient not taking: Reported on 02/04/2015) 4 tablet 0  . calcium carbonate (TUMS) 500 MG chewable tablet Chew 1 tablet (200 mg of elemental calcium total) by mouth daily. (Patient not taking: Reported on 02/21/2015) 30 tablet 0  . polyethylene glycol powder (GLYCOLAX/MIRALAX) powder Take 255 g by mouth once. Please see surgery prep instructions given at appointment (Patient not taking: Reported on 02/04/2015) 255 g 0     Review of Systems A multi-point review of systems was asked and was negative except findings documented in the HPI  Physical Exam Blood pressure 163/94, pulse 55, temperature 98.2 F (36.8 C), temperature source Oral, resp. rate 15, height 5\' 3"  (1.6 m), weight 108.863 kg (240 lb), SpO2 95 %. CONSTITUTIONAL: No acute distress. EYES: Pupils are equal, round, and reactive to light, Sclera are non-icteric. EARS, NOSE, MOUTH AND THROAT: The oropharynx is clear. The oral mucosa is pink and moist. Hearing is intact to voice. LYMPH NODES:  Lymph nodes in the neck are normal. RESPIRATORY:  Lungs are clear. There is normal respiratory effort, with equal breath sounds bilaterally, and without pathologic use of accessory muscles. CARDIOVASCULAR: Heart is regular without murmurs, gallops, or rubs. GI: The abdomen is very large, soft, nontender, and nondistended. The is a large escar lateral to her ostomy that has split away from the skin cephalad to it. The ostomy has stool within it and there is a dark drainage on the dressing covering the area. GU: Rectal deferred.   MUSCULOSKELETAL: Normal muscle strength and tone. No cyanosis or edema.   SKIN: Turgor is good and there are no pathologic skin lesions or ulcers. NEUROLOGIC: Motor and sensation is grossly normal. Cranial nerves are grossly  intact. PSYCH:  Oriented to person, place and time. Affect is normal.  Data Reviewed Labs reviewed, no leukocytosis. CT still pending I have personally reviewed the patient's imaging, laboratory findings and medical records.    Assessment    65 year old female with a  Wound infection at the site of her prior parastomal hernia repair.      Plan    Plan to admit to surgery after CT scan is performed in the ER. Patient will likely need debridement of the wound and may need a wound vac. Will wait for CT results to further qualify what surgical intervention is warranted. Will obtain medicine consult again for her cardiac and renal status. Will keep NPO for now except meds, IV hydration, IV ABX and likely trip to OR tomorrow for wound debridement.  Time spent with the patient was 45 minutes, with more than 50% of the time spent in face-to-face education, counseling and care coordination.     Clayburn Pert, MD FACS General Surgeon 02/21/2015, 12:53 PM

## 2015-02-21 NOTE — Progress Notes (Signed)
ANTICOAGULATION CONSULT NOTE - Initial Consult  Pharmacy Consult for Lovenox  Indication: VTE prophylaxis  Allergies  Allergen Reactions  . Buspirone Other (See Comments)    Weakness  . Citalopram Other (See Comments)  . Lisinopril Cough  . Metformin Diarrhea    At 1000 mg dose  . Pravastatin Other (See Comments)    insomnia  . Sitagliptin Other (See Comments)    constipation  . Tramadol Itching  . Diltiazem Hcl Palpitations  . Gabapentin Palpitations  . Lovastatin Palpitations    Patient Measurements: Height: 5\' 3"  (160 cm) Weight: 240 lb (108.863 kg) IBW/kg (Calculated) : 52.4 Heparin Dosing Weight:   Vital Signs: Temp: 98 F (36.7 C) (01/03 1423) Temp Source: Oral (01/03 1423) BP: 132/93 mmHg (01/03 1423) Pulse Rate: 78 (01/03 1423)  Labs:  Recent Labs  02/21/15 1116  HGB 8.8*  HCT 28.0*  PLT 366  CREATININE 1.99*    Estimated Creatinine Clearance: 33.8 mL/min (by C-G formula based on Cr of 1.99).   Medical History: Past Medical History  Diagnosis Date  . Diabetes mellitus without complication ([place])   . GI bleed   . Aortic valve stenosis   . Hyperlipidemia   . Hypertension   . Diverticulitis large intestine   . Obesity   . Shortness of breath dyspnea   . Hyperthyroidism   . Anxiety   . Arthritis   . Neuropathy in diabetes ([place])   . Anemia   . Vertigo   . Bowel obstruction ([facility])     Medications:  Scheduled:  . amiodarone  200 mg Oral Daily  . atorvastatin  40 mg Oral QHS  . digoxin  0.0625 mg Oral Daily  . DULoxetine  30 mg Oral Daily  . enoxaparin (LOVENOX) injection  40 mg Subcutaneous Q12H  . glimepiride  4 mg Oral BID  . irbesartan  300 mg Oral Daily  . [START ON 02/22/2015] levothyroxine  88 mcg Oral BH-q7a  . metoprolol succinate  100 mg Oral BID  . pantoprazole (PROTONIX) IV  40 mg Intravenous QHS  . piperacillin-tazobactam (ZOSYN)  IV  3.375 g Intravenous Q8H  . sodium chloride  2,000 mL Intravenous Once  . vancomycin  1,500  mg Intravenous Once    Assessment: CrCl = 33.8 ml/min BMI  = 42.6  Pt scheduled for debridement procedure on 1/04, spoke with Dr Adonis Huguenin , lovenox should not be problem for that procedure.  Goal of Therapy:  DVT prophylaxis   Plan:  Lovenox 30 mg SQ Q24H originally ordered,  Will adjust dose to lovenox 40 mg SQ Q12H based on BMI > 40 .   [name] D 02/21/2015,5:10 PM

## 2015-02-21 NOTE — ED Notes (Signed)
Pt came by ems for wound infection.  Has ostomy left abd with liquid brow stool in bag.  Bag is almost off and there is stool and pus from wound to left of ostomy with blackened skin.  Staples are in place.  Patient also has midline incisiion.  Left abd cleaned.  Bag removed and abd pads/chux applied.

## 2015-02-21 NOTE — Anesthesia Preprocedure Evaluation (Deleted)
Anesthesia Evaluation  Patient identified by MRN, date of birth, ID band Patient awake    Reviewed: Allergy & Precautions, H&P , NPO status , Patient's Chart, lab work & pertinent test results  History of Anesthesia Complications Negative for: history of anesthetic complications  Airway Mallampati: III  TM Distance: <3 FB Neck ROM: limited    Dental  (+) Poor Dentition, Chipped   Pulmonary neg shortness of breath, former smoker,    Pulmonary exam normal breath sounds clear to auscultation       Cardiovascular Exercise Tolerance: Good hypertension, Pt. on medications and Pt. on home beta blockers (-) angina(-) Past MI and (-) DOE Normal cardiovascular exam+ dysrhythmias Atrial Fibrillation + Valvular Problems/Murmurs AS  Rhythm:regular Rate:Normal     Neuro/Psych PSYCHIATRIC DISORDERS Anxiety negative neurological ROS     GI/Hepatic negative GI ROS, Neg liver ROS,   Endo/Other  diabetes, Type 2, Insulin DependentHyperthyroidism   Renal/GU negative Renal ROS  negative genitourinary   Musculoskeletal  (+) Arthritis ,   Abdominal   Peds  Hematology negative hematology ROS (+)   Anesthesia Other Findings Bowel Obstruction  Past Medical History:   Diabetes mellitus without complication ([facility])                 GI bleed                                                     Aortic valve stenosis                                        Hyperlipidemia                                               Hypertension                                                 Diverticulitis large intestine                               Obesity                                                      Shortness of breath dyspnea                                  Hyperthyroidism                                              Anxiety  Arthritis                                                   Neuropathy in diabetes ([place])                                 Anemia                                                       Vertigo                                                      Bowel obstruction [facility])                                     Past Surgical History:   COLECTOMY                                                     laparotomy closure of cecal perforation          05/09/2013     Comment:Dr. Marina Gravel   TONSILLECTOMY                                                 BACK SURGERY                                                    Comment:spur frmoved from lower back   CARDIAC CATHETERIZATION                                       ABDOMINAL HYSTERECTOMY                                       BMI    Body Mass Index   39.86 kg/m 2    Patient has cardiac clearance for this procedure.    Reproductive/Obstetrics negative OB ROS                             Anesthesia Physical  Anesthesia Plan  ASA: IV  Anesthesia Plan: General ETT and Rapid Sequence   Post-op Pain Management:    Induction:   Airway Management Planned:   Additional Equipment:   Intra-op Plan:  Post-operative Plan:   Informed Consent: I have reviewed the patients History and Physical, chart, labs and discussed the procedure including the risks, benefits and alternatives for the proposed anesthesia with the patient or authorized representative who has indicated his/her understanding and acceptance.     Plan Discussed with: Anesthesiologist, CRNA and Surgeon  Anesthesia Plan Comments: (Patient informed that they are higher risk for complications from anesthesia during this procedure due to their medical history.  Patient voiced understanding.)        Anesthesia Quick Evaluation

## 2015-02-21 NOTE — ED Notes (Signed)
In and out cath for clear urine.  Spec tp lab.  IV 18 g placed in left and right arms by steve snider with use of ultrasound.

## 2015-02-21 NOTE — Telephone Encounter (Signed)
Spoke with nursing staff from [facility] at this time. Patient's left abdominal incision dehisced over the weekend. She states patient's current vitals are 122, 96/52, 100.85F. States that patient has not been seen by a physician during this time as they were waiting until today to let us know that this had occurred. Explained to nurse that she needs to have her transported to the Emergency Room at this time.   Within seconds of getting off of the phone with [facility] nursing staff, patient's daughter called and states that she is very frustrated with the situation at [facility]. She states that it is dirty in the facility and she is scared that this is the reasoning why the wound is infected. I explained that she may move her mother if she wishes and we would be glad to sign orders for any transfer but she would have to initiate this with the care coordinators at the facility.

## 2015-02-22 ENCOUNTER — Encounter: Payer: Self-pay | Admitting: Anesthesiology

## 2015-02-22 ENCOUNTER — Inpatient Hospital Stay: Payer: PPO | Admitting: Anesthesiology

## 2015-02-22 ENCOUNTER — Encounter
Admission: EM | Disposition: A | Discharge status: Home Health | Payer: Self-pay | Source: Home / Self Care | Attending: General Surgery

## 2015-02-22 ENCOUNTER — Encounter: Payer: PPO | Admitting: Surgery

## 2015-02-22 DIAGNOSIS — L03311 Cellulitis of abdominal wall: Secondary | ICD-10-CM

## 2015-02-22 HISTORY — PX: DEBRIDEMENT OF ABDOMINAL WALL ABSCESS: SHX6396

## 2015-02-22 LAB — COMPREHENSIVE METABOLIC PANEL
ALBUMIN: 2.3 g/dL — AB (ref 3.5–5.0)
ALK PHOS: 86 U/L (ref 38–126)
ALT: 13 U/L — AB (ref 14–54)
AST: 14 U/L — ABNORMAL LOW (ref 15–41)
Anion gap: 9 (ref 5–15)
BUN: 19 mg/dL (ref 6–20)
CALCIUM: 6.5 mg/dL — AB (ref 8.9–10.3)
CO2: 26 mmol/L (ref 22–32)
CREATININE: 1.77 mg/dL — AB (ref 0.44–1.00)
Chloride: 108 mmol/L (ref 101–111)
GFR calc non Af Amer: 29 mL/min — ABNORMAL LOW (ref >60)
GFR, EST AFRICAN AMERICAN: 34 mL/min — AB (ref >60)
GLUCOSE: 73 mg/dL (ref 65–99)
Potassium: 4.2 mmol/L (ref 3.5–5.1)
SODIUM: 143 mmol/L (ref 135–145)
Total Bilirubin: 0.7 mg/dL (ref 0.3–1.2)
Total Protein: 6.4 g/dL — ABNORMAL LOW (ref 6.5–8.1)

## 2015-02-22 LAB — CBC
HEMATOCRIT: 24.6 % — AB (ref 35.0–47.0)
Hemoglobin: 7.9 g/dL — ABNORMAL LOW (ref 12.0–16.0)
MCH: 26.8 pg (ref 26.0–34.0)
MCHC: 32.2 g/dL (ref 32.0–36.0)
MCV: 83 fL (ref 80.0–100.0)
Platelets: 356 10*3/uL (ref 150–440)
RBC: 2.97 MIL/uL — ABNORMAL LOW (ref 3.80–5.20)
RDW: 14.6 % — AB (ref 11.5–14.5)
WBC: 10.5 10*3/uL (ref 3.6–11.0)

## 2015-02-22 LAB — PROTIME-INR
INR: 1.28
Prothrombin Time: 16.1 seconds — ABNORMAL HIGH (ref 11.4–15.0)

## 2015-02-22 LAB — PHOSPHORUS: Phosphorus: 4.3 mg/dL (ref 2.5–4.6)

## 2015-02-22 LAB — APTT: aPTT: 36 seconds (ref 24–36)

## 2015-02-22 LAB — MRSA PCR SCREENING: MRSA by PCR: NEGATIVE

## 2015-02-22 LAB — GLUCOSE, CAPILLARY
GLUCOSE-CAPILLARY: 100 mg/dL — AB (ref 65–99)
Glucose-Capillary: 55 mg/dL — ABNORMAL LOW (ref 65–99)
Glucose-Capillary: 143 mg/dL — ABNORMAL HIGH (ref 65–99)

## 2015-02-22 LAB — URINE CULTURE: CULTURE: NO GROWTH

## 2015-02-22 LAB — MAGNESIUM: Magnesium: 1.6 mg/dL — ABNORMAL LOW (ref 1.7–2.4)

## 2015-02-22 SURGERY — DEBRIDEMENT OF ABDOMINAL WALL ABSCESS
Anesthesia: General | Wound class: Dirty or Infected

## 2015-02-22 MED ORDER — GLYCOPYRROLATE 0.2 MG/ML IJ SOLN
INTRAMUSCULAR | Status: DC | PRN
  Administered 2015-02-22: 0.4 mg via INTRAVENOUS
  Administered 2015-02-22: 0.2 mg via INTRAVENOUS

## 2015-02-22 MED ORDER — FENTANYL CITRATE (PF) 100 MCG/2ML IJ SOLN
INTRAMUSCULAR | Status: AC
  Administered 2015-02-22: 25 ug via INTRAVENOUS
  Filled 2015-02-22: qty 2

## 2015-02-22 MED ORDER — LACTATED RINGERS IV SOLN
INTRAVENOUS | Status: DC | PRN
  Administered 2015-02-22: 17:00:00 via INTRAVENOUS

## 2015-02-22 MED ORDER — INSULIN ASPART 100 UNIT/ML ~~LOC~~ SOLN
0.0000 [IU] | Freq: Three times a day (TID) | SUBCUTANEOUS | Status: DC
  Administered 2015-02-23 (×2): 1 [IU] via SUBCUTANEOUS
  Administered 2015-02-25: 2 [IU] via SUBCUTANEOUS
  Administered 2015-02-25 – 2015-02-27 (×4): 1 [IU] via SUBCUTANEOUS
  Administered 2015-02-27 – 2015-02-28 (×2): 2 [IU] via SUBCUTANEOUS
  Administered 2015-02-28 – 2015-03-01 (×3): 1 [IU] via SUBCUTANEOUS
  Administered 2015-03-02 – 2015-03-05 (×4): 2 [IU] via SUBCUTANEOUS
  Administered 2015-03-05: 1 [IU] via SUBCUTANEOUS
  Administered 2015-03-05: 2 [IU] via SUBCUTANEOUS
  Administered 2015-03-06 – 2015-03-09 (×11): 1 [IU] via SUBCUTANEOUS
  Administered 2015-03-10: 2 [IU] via SUBCUTANEOUS
  Administered 2015-03-10 – 2015-03-11 (×4): 1 [IU] via SUBCUTANEOUS
  Administered 2015-03-11: 2 [IU] via SUBCUTANEOUS
  Administered 2015-03-12 – 2015-03-13 (×4): 1 [IU] via SUBCUTANEOUS
  Filled 2015-02-22: qty 2
  Filled 2015-02-22: qty 1
  Filled 2015-02-22: qty 2
  Filled 2015-02-22 (×2): qty 1
  Filled 2015-02-22 (×2): qty 2
  Filled 2015-02-22 (×7): qty 1
  Filled 2015-02-22: qty 2
  Filled 2015-02-22 (×3): qty 1
  Filled 2015-02-22: qty 2
  Filled 2015-02-22: qty 1
  Filled 2015-02-22: qty 2
  Filled 2015-02-22 (×4): qty 1
  Filled 2015-02-22: qty 2
  Filled 2015-02-22 (×6): qty 1
  Filled 2015-02-22: qty 2
  Filled 2015-02-22 (×5): qty 1
  Filled 2015-02-22: qty 2
  Filled 2015-02-22: qty 1

## 2015-02-22 MED ORDER — PROPOFOL 10 MG/ML IV BOLUS
INTRAVENOUS | Status: DC | PRN
Start: 2015-02-22 — End: 2015-02-22
  Administered 2015-02-22: 120 mg via INTRAVENOUS

## 2015-02-22 MED ORDER — FENTANYL CITRATE (PF) 100 MCG/2ML IJ SOLN: 25.0000 ug | INTRAMUSCULAR | Status: DC | PRN

## 2015-02-22 MED ORDER — FENTANYL CITRATE (PF) 100 MCG/2ML IJ SOLN
25.0000 ug | INTRAMUSCULAR | Status: DC | PRN
  Administered 2015-02-22 (×4): 25 ug via INTRAVENOUS

## 2015-02-22 MED ORDER — NEOSTIGMINE METHYLSULFATE 10 MG/10ML IV SOLN
INTRAVENOUS | Status: DC | PRN
  Administered 2015-02-22: 5 mg via INTRAVENOUS

## 2015-02-22 MED ORDER — ONDANSETRON HCL 4 MG/2ML IJ SOLN: 4.0000 mg | Freq: Once | INTRAMUSCULAR | Status: DC | PRN

## 2015-02-22 MED ORDER — DEXTROSE-NACL 5-0.45 % IV SOLN
INTRAVENOUS | Status: DC
  Administered 2015-02-22 (×2): via INTRAVENOUS

## 2015-02-22 MED ORDER — MIDAZOLAM HCL 2 MG/2ML IJ SOLN
INTRAMUSCULAR | Status: DC | PRN
  Administered 2015-02-22: 2 mg via INTRAVENOUS

## 2015-02-22 MED ORDER — DEXTROSE 50 % IV SOLN
50.0000 mL | Freq: Once | INTRAVENOUS | Status: AC
  Administered 2015-02-22: 50 mL via INTRAVENOUS

## 2015-02-22 MED ORDER — DEXTROSE 50 % IV SOLN
INTRAVENOUS | Status: AC
  Administered 2015-02-22: 50 mL
  Filled 2015-02-22: qty 50

## 2015-02-22 MED ORDER — SUCCINYLCHOLINE CHLORIDE 20 MG/ML IJ SOLN
INTRAMUSCULAR | Status: DC | PRN
  Administered 2015-02-22: 80 mg via INTRAVENOUS

## 2015-02-22 MED ORDER — ONDANSETRON HCL 4 MG/2ML IJ SOLN
INTRAMUSCULAR | Status: DC | PRN
  Administered 2015-02-22: 4 mg via INTRAVENOUS

## 2015-02-22 MED ORDER — ROCURONIUM BROMIDE 100 MG/10ML IV SOLN
INTRAVENOUS | Status: DC | PRN
  Administered 2015-02-22: 30 mg via INTRAVENOUS

## 2015-02-22 MED ORDER — EPHEDRINE SULFATE 50 MG/ML IJ SOLN
INTRAMUSCULAR | Status: DC | PRN
  Administered 2015-02-22: 5 mg via INTRAVENOUS
  Administered 2015-02-22: 10 mg via INTRAVENOUS

## 2015-02-22 MED ORDER — FENTANYL CITRATE (PF) 100 MCG/2ML IJ SOLN
INTRAMUSCULAR | Status: DC | PRN
  Administered 2015-02-22 (×2): 50 ug via INTRAVENOUS

## 2015-02-22 SURGICAL SUPPLY — 28 items
BNDG GAUZE 4.5X4.1 6PLY STRL (MISCELLANEOUS) ×3 IMPLANT
CANISTER SUCT 1200ML W/VALVE (MISCELLANEOUS) ×3 IMPLANT
CATH TRAY 16F METER LATEX (MISCELLANEOUS) ×3 IMPLANT
DRAIN CHANNEL 32F RND 10.7 FF (WOUND CARE) ×3 IMPLANT
DRAPE LAPAROTOMY 100X77 ABD (DRAPES) ×3 IMPLANT
ELECT CAUTERY NEEDLE TIP 1.0 (MISCELLANEOUS) ×3
ELECTRODE CAUTERY NEDL TIP 1.0 (MISCELLANEOUS) ×1 IMPLANT
GAUZE SPONGE 4X4 12PLY STRL (GAUZE/BANDAGES/DRESSINGS) ×3 IMPLANT
GLOVE BIO SURGEON STRL SZ7.5 (GLOVE) ×3 IMPLANT
GLOVE BIO SURGEON STRL SZ8 (GLOVE) ×12 IMPLANT
GLOVE BIOGEL PI IND STRL 6.5 (GLOVE) ×1 IMPLANT
GLOVE BIOGEL PI INDICATOR 6.5 (GLOVE) ×2
GLOVE SURG SYN 8.0 (GLOVE) ×3 IMPLANT
GOWN STRL REUS W/ TWL LRG LVL3 (GOWN DISPOSABLE) ×3 IMPLANT
GOWN STRL REUS W/TWL LRG LVL3 (GOWN DISPOSABLE) ×6
KIT RM TURNOVER STRD PROC AR (KITS) ×3 IMPLANT
LABEL OR SOLS (LABEL) IMPLANT
NS IRRIG 1000ML POUR BTL (IV SOLUTION) ×3 IMPLANT
PACK BASIN MAJOR ARMC (MISCELLANEOUS) ×3 IMPLANT
PAD ABD DERMACEA PRESS 5X9 (GAUZE/BANDAGES/DRESSINGS) ×12 IMPLANT
PAD GROUND ADULT SPLIT (MISCELLANEOUS) ×3 IMPLANT
REMOVER STAPLE SKIN (DISPOSABLE) ×6 IMPLANT
STAPLER SKIN PROX 35W (STAPLE) ×3 IMPLANT
SUT CHROMIC 0 CT 1 (SUTURE) IMPLANT
SUT CHROMIC BR 1/2CLE 2-0 54IN (SUTURE) IMPLANT
SUT MAXON ABS #0 GS21 30IN (SUTURE) IMPLANT
SUT PROLENE 0 CT 1 30 (SUTURE) ×3 IMPLANT
SUT VIC AB 1 CTX 27 (SUTURE) IMPLANT

## 2015-02-22 NOTE — Anesthesia Procedure Notes (Signed)
Procedure Name: Intubation Date/Time: 02/22/2015 5:15 PM Performed by: Jonna Clark Pre-anesthesia Checklist: Patient identified, Patient being monitored, Timeout performed, Emergency Drugs available and Suction available Patient Re-evaluated:Patient Re-evaluated prior to inductionOxygen Delivery Method: Circle system utilized Preoxygenation: Pre-oxygenation with 100% oxygen Intubation Type: IV induction Ventilation: Mask ventilation without difficulty Laryngoscope Size: Mac and 3 Grade View: Grade III Tube type: Oral Tube size: 7.0 mm Number of attempts: 1 Airway Equipment and Method: Stylet Placement Confirmation: ETT inserted through vocal cords under direct vision,  positive ETCO2 and breath sounds checked- equal and bilateral Secured at: 21 cm Tube secured with: Tape Dental Injury: Teeth and Oropharynx as per pre-operative assessment

## 2015-02-22 NOTE — Progress Notes (Signed)
Recheck of B.S. Was higher at 143.

## 2015-02-22 NOTE — Progress Notes (Signed)
Paged MD regarding glucose of 55. Orders given to change fluids to D5 1/2 NS @125 /hr. Pt is currently NPO status awaiting surgery this afternoon. Will recheck B.S. In 30-45 mins.

## 2015-02-22 NOTE — Transfer of Care (Signed)
Immediate Anesthesia Transfer of Care Note  Patient: [name]  Procedure(s) Performed: Procedure(s): DEBRIDEMENT OF ABDOMINAL WALL ABSCESS (N/A)  Patient Location: PACU  Anesthesia Type:General  Level of Consciousness: lethargic and cooperative  Airway & Oxygen Therapy: Patient Spontanous Breathing and Patient connected to face mask oxygen  Post-op Assessment: Report given to RN and Post -op Vital signs reviewed and stable  Post vital signs: Reviewed  Last Vitals:  Filed Vitals:   02/22/15 1508 02/22/15 1831  BP: 176/81 131/79  Pulse: 50 54  Temp:    Resp:  18    Complications: No apparent anesthesia complications

## 2015-02-22 NOTE — Progress Notes (Signed)
Inpatient Diabetes Program Recommendations  AACE/ADA: New Consensus Statement on Inpatient Glycemic Control (2015)  Target Ranges:  Prepandial:   less than 140 mg/dL      [facility] postprandial:   less than 180 mg/dL (1-2 hours)      Critically ill patients:  140 - 180 mg/dL  Results for WANA, BURHAM (MRN [id number]) as of 02/22/2015 11:20  Ref. Range 02/21/2015 11:16 02/22/2015 04:14  Glucose Latest Ref Range: 65-99 mg/dL 134 (H) 73   Review of Glycemic Control  Diabetes history: DM2 Outpatient Diabetes medications: Amaryl 4 mg BID Current orders for Inpatient glycemic control: Amaryl 4 mg BID  Inpatient Diabetes Program Recommendations: Correction (SSI): Please order CBGs with Novolog sensitive correction scale Q4H (once diet is resumed and patient is tolerating diet then recommend changing frequency to ACHS). Oral Agents: Patient is NPO and fasting glucose 73 mg/dl this morning. While inpatient, please discontinue oral DM medication and use Novolog correction insulin if needed for inpatient glycemic control.  Thanks, Barnie Alderman, RN, MSN, CDE Diabetes Coordinator Inpatient Diabetes Program [phone] (Team Pager from [place] to [place]) [phone] ([facility] office) [phone] [facility] office) [phone] [facility] office)

## 2015-02-22 NOTE — Op Note (Signed)
   Pre-operative Diagnosis: abdominal wall abscess  Post-operative Diagnosis:  Necrotic superficial ostomy associated with abdominal wall abscess  Surgeon: Clayburn Pert   Assistants:  Dr. Nestor Lewandowsky  Anesthesia: General endotracheal anesthesia  ASA Class: 3  Surgeon: Clayburn Pert, MD FACS  Anesthesia: Gen. with endotracheal tube  Assistant: none  Procedure Details  The patient was seen again in the Holding Room. The benefits, complications, treatment options, and expected outcomes were discussed with the patient. The risks of bleeding, infection, recurrence of symptoms, failure to resolve symptoms,  bowel injury, any of which could require further surgery were reviewed with the patient.   The patient was taken to Operating Room, identified as [name] and the procedure verified.  A Time Out was held and the above information confirmed.  Prior to the induction of general anesthesia, antibiotic prophylaxis was administered. VTE prophylaxis was in place. General endotracheal anesthesia was then administered and tolerated well. After the induction, the abdomen was prepped with  Betadine and draped in the sterile fashion. The patient was positioned in the supine position.  The obvious necrotic tissue was removed using combination of blunt dissection and Bovie much cautery. The necrotic top of the ostomy was digitally manipulated and found to be free-floating in purulent liquid. This was all bluntly removed. After removing the necrotic tissue a viable, healthy appearing colonic mucosa was visualized at the fascia. The fascia was noted to be intact throughout the entirety of the wound. The entire wound was copiously irrigated and any nonviable tissue was bluntly removed.   At this point the decision was made to perform wet-to-dry dressings. A 32 [place] Blake drain was able be placed into the ostomy for control of stool and was secured in place the skin with a 0 Prolene suture. A  Kerlix gauze was soaked in saline and place tightly into the cavity around the [place] drain. ABG pads were placed over this and secured with tape. The Blake drain was placed to a Foley bag for fecal control.  Findings: necrotic superficial ostomy with large abdominal wall abscess   Estimated Blood Loss:  10 mL's         Drains:  23 [place] Blake drain into the ostomy         Specimens:  none          Complications:  none                  Condition:  good   Clayburn Pert, MD, FACS

## 2015-02-22 NOTE — Progress Notes (Signed)
Amp of D50 was given per MD orders. Will recheck in 30 mins.

## 2015-02-22 NOTE — Progress Notes (Signed)
CC: Wound infection Subjective: Patient made yesterday with a wound infection. Had planned to be taken to the operating room last night but was unable to secondary to multiple emergency surgeries. Patient is without systemic illness and therefore surgery was delayed for today. She reports no changes overnight. Only complaint is of the foul-smelling drainage coming from her wound. Denies any fevers, chills, vomiting, nausea, chest pain, shortness of breath.  Objective: Vital signs in last 24 hours: Temp:  [97.8 F (36.6 C)-98.6 F (37 C)] 97.8 F (36.6 C) (01/04 0948) Pulse Rate:  [51-78] 62 (01/04 1007) Resp:  [13-18] 16 (01/04 0948) BP: (132-184)/(77-101) 175/101 mmHg (01/04 0948) SpO2:  [91 %-99 %] 96 % (01/04 0948)    Intake/Output from previous day: 01/03 0701 - 01/04 0700 In: 1155.6 [I.V.:1105.6; IV Piggyback:50] Out: 1200 [Urine:1200] Intake/Output this shift: Total I/O In: 215 [I.V.:215] Out: -   Physical exam:  Gen.: No acute distress Chest: Clear to auscultation Heart: Regular rhythm Abdomen: Large, soft, large necrotic wound lateral to left upper quadrant ostomy.  Lab Results: CBC   Recent Labs  02/21/15 1116 02/22/15 0414  WBC 9.3 10.5  HGB 8.8* 7.9*  HCT 28.0* 24.6*  PLT 366 356   BMET  Recent Labs  02/21/15 1116 02/22/15 0414  NA 140 143  K 4.3 4.2  CL 105 108  CO2 25 26  GLUCOSE 134* 73  BUN 23* 19  CREATININE 1.99* 1.77*  CALCIUM 6.5* 6.5*   PT/INR  Recent Labs  02/22/15 0414  LABPROT 16.1*  INR 1.28   ABG No results for input(s): PHART, HCO3 in the last 72 hours.  Invalid input(s): PCO2, PO2  Studies/Results: Ct Abdomen Pelvis Wo Contrast  02/21/2015  CLINICAL DATA:  foul smelling drainage from surgical incision site, history of hernia repair left abdominal wall EXAM: CT ABDOMEN AND PELVIS WITHOUT CONTRAST TECHNIQUE: Multidetector CT imaging of the abdomen and pelvis was performed following the standard protocol without IV  contrast. COMPARISON:  02/10/2015 FINDINGS: Lung bases are unremarkable. Mild anasarca infiltration of subcutaneous fat bilateral flank wall. There are streaky artifacts from patient's large body habitus. Mild degenerative changes lower thoracic spine. Unenhanced liver shows no biliary ductal dilatation. The patient is status postcholecystectomy. The pancreas, spleen and adrenal glands are unremarkable. Atherosclerotic calcifications of distal abdominal aorta and iliac arteries. Normal appendix is partially visualized in axial image 58. There is no pericecal inflammation. Some colonic stool noted in right colon. Colonic diverticula are noted right colon and transverse colon. 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 This is best seen in axial image 51. There is small parastomal hernia containing fat measures about 4.8 cm. Again noted stranding of subcutaneous fat and small amount of fluid just left lateral to colostomy. There is increased subcutaneous air with bubbly appearance extending superficial to left anterior abdominal wall at the level of skin staples. 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 There is no evidence of drainable abscess. There is skin thickening in the region of skin staples left abdominal wall suspicious for dermatitis or cellulitis. IMPRESSION: 1. 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 Small parastomal hernia containing fat seen in axial image 47 measures 4.8 cm. 2. Again noted stranding of subcutaneous fat and small amount of fluid just left lateral to colostomy. There is increased subcutaneous air with bubbly appearance extending superficial to left anterior abdominal wall at the level of  skin staples. 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 Please see axial image 43 and axial  image 47. There is no evidence of drainable abscess. 3. No hydronephrosis or hydroureter. 4. Mild anasarca infiltration of subcutaneous fat bilateral flank wall. These results were called by telephone at the time of interpretation on 02/21/2015 at 2:08 pm to Dr. Carrie Mew , who verbally acknowledged these results. Electronically Signed   By: Lahoma Crocker M.D.   On: 02/21/2015 14:08    Anti-infectives: Anti-infectives    Start     Dose/Rate Route Frequency Ordered Stop   02/21/15 1900  piperacillin-tazobactam (ZOSYN) IVPB 3.375 g  Status:  Discontinued     3.375 g 12.5 mL/hr over 240 Minutes Intravenous 3 times per day 02/21/15 1453 02/21/15 1504   02/21/15 1700  piperacillin-tazobactam (ZOSYN) IVPB 3.375 g     3.375 g 12.5 mL/hr over 240 Minutes Intravenous Every 8 hours 02/21/15 1504     02/21/15 1045  vancomycin (VANCOCIN) 1,500 mg in sodium chloride 0.9 % 500 mL IVPB     1,500 mg 250 mL/hr over 120 Minutes Intravenous  Once 02/21/15 1021     02/21/15 1030  piperacillin-tazobactam (ZOSYN) IVPB 3.375 g     3.375 g 12.5 mL/hr over 240 Minutes Intravenous  Once 02/21/15 1021 02/21/15 1151      Assessment/Plan:  65 year old female with a large necrotic wound secondary to recent parastomal hernia repair. Plain state operative today for debridement and possible wound VAC placement. We will continue to follow closely and asked internal medicine to evaluate postoperatively for her multiple medical problems.Juanda Crumble T. Adonis Huguenin, MD, FACS  02/22/2015

## 2015-02-22 NOTE — Consult Note (Addendum)
[facility] HOSPITALIST  Medical Consultation  [name] [id number] DOB: 1950-06-06 DOA: 02/21/2015 PCP: Adin Hector, MD   Requesting physician: Clayburn Pert MD  Date of consultation:  02/22/2015 Reason for consultation: Preop clearance and medical opinion regarding her diabetes, hypertension, hyperlipidemia and moderate aortic stenosis CHIEF COMPLAINT:   Chief Complaint  Patient presents with  . Wound Infection    HISTORY OF PRESENT ILLNESS: [name]  is a 65 y.o. female with a known history of small bowel obstruction who was hospitalized 12/17 and discharged on 12/28 by the surgical team after she was hospitalized with incarcerated parastomal hernia causing small bowel obstruction. She underwent expiratory laparotomy. She was subsequently discharged home is being readmitted by surgical service for wound infection. Plan is for patient to be taken to the OR. Patient denies any chest pain shortness of breath no fevers or chills  PAST MEDICAL HISTORY:   Past Medical History  Diagnosis Date  . Diabetes mellitus without complication ([place])   . GI bleed   . Aortic valve stenosis   . Hyperlipidemia   . Hypertension   . Diverticulitis large intestine   . Obesity   . Shortness of breath dyspnea   . Hyperthyroidism   . Anxiety   . Arthritis   . Neuropathy in diabetes ([place])   . Anemia   . Vertigo   . Bowel obstruction ([place])     PAST SURGICAL HISTORY: Past Surgical History  Procedure Laterality Date  . Colectomy    . Laparotomy closure of cecal perforation  05/09/2013    Dr. Marina Gravel  . Tonsillectomy    . Back surgery      spur frmoved from lower back  . Cardiac catheterization    . Abdominal hysterectomy    . Laparotomy N/A 02/06/2015    Procedure: Laparotomy, reduction of incarcerated parastomal hernia, repair of parastomal hernia with mesh;  Surgeon: Sherri Rad, MD;  Location: [facility] ORS;  Service: General;  Laterality: N/A;    SOCIAL HISTORY:  Social History   Substance Use Topics  . Smoking status: Former Smoker -- 0.25 packs/day for 1 years    Types: Cigarettes    Quit date: 04/18/2013  . Smokeless tobacco: Never Used  . Alcohol Use: No    FAMILY HISTORY:  Family History  Problem Relation Age of Onset  . Diabetes Mother   . Hypertension Father     DRUG ALLERGIES:  Allergies  Allergen Reactions  . Buspirone Other (See Comments)    Weakness  . Citalopram Other (See Comments)  . Lisinopril Cough  . Metformin Diarrhea    At 1000 mg dose  . Pravastatin Other (See Comments)    insomnia  . Sitagliptin Other (See Comments)    constipation  . Tramadol Itching  . Diltiazem Hcl Palpitations  . Gabapentin Palpitations  . Lovastatin Palpitations    REVIEW OF SYSTEMS:   CONSTITUTIONAL: No fever, fatigue or weakness.  EYES: No blurred or double vision.  EARS, NOSE, AND THROAT: No tinnitus or ear pain.  RESPIRATORY: No cough, shortness of breath, wheezing or hemoptysis. Dyspnea on exertion with activity CARDIOVASCULAR: No chest pain, orthopnea, edema.  GASTROINTESTINAL: No nausea, vomiting, diarrhea or abdominal pain.  GENITOURINARY: No dysuria, hematuria.  ENDOCRINE: No polyuria, nocturia,  HEMATOLOGY: No anemia, easy bruising or bleeding SKIN: No rash or lesion. MUSCULOSKELETAL: No joint pain or arthritis.   NEUROLOGIC: No tingling, numbness, weakness.  PSYCHIATRY: No anxiety or depression.   MEDICATIONS AT HOME:  Prior to Admission  medications   Medication Sig Start Date End Date Taking? Authorizing Provider  amiodarone (PACERONE) 200 MG tablet Take 1 tablet (200 mg total) by mouth daily. 02/15/15  Yes Hubbard Robinson, MD  atorvastatin (LIPITOR) 40 MG tablet Take 40 mg by mouth at bedtime. 07/22/14  Yes Historical Provider, MD  clobetasol cream (TEMOVATE) [id number] % Apply 1 application topically 2 (two) times daily as needed (for rash).    Yes Historical Provider, MD  digoxin 62.5 MCG TABS Take 0.0625 mg by mouth daily. 02/15/15   Yes Hubbard Robinson, MD  DULoxetine (CYMBALTA) 30 MG capsule Take 30 mg by mouth daily.  08/12/14  Yes Historical Provider, MD  glimepiride (AMARYL) 4 MG tablet Take 4 mg by mouth 2 (two) times daily.  08/12/14  Yes Historical Provider, MD  levothyroxine (SYNTHROID, LEVOTHROID) 88 MCG tablet Take 88 mcg by mouth every morning.    Yes Historical Provider, MD  metoprolol succinate (TOPROL-XL) 100 MG 24 hr tablet Take 1 tablet (100 mg total) by mouth 2 (two) times daily. 02/15/15  Yes Hubbard Robinson, MD  nystatin (MYCOSTATIN/NYSTOP) 100000 UNIT/GM POWD Apply topically daily. Under abdominal pannuses   Yes Historical Provider, MD  oxyCODONE-acetaminophen (PERCOCET/ROXICET) 5-325 MG tablet Take 1 tablet by mouth every 6 (six) hours as needed for moderate pain (for pain). Patient taking differently: Take 1 tablet by mouth every 4 (four) hours.  02/15/15  Yes Hubbard Robinson, MD  telmisartan (MICARDIS) 80 MG tablet Take 80 mg by mouth daily.   Yes Historical Provider, MD  bisacodyl (DULCOLAX) 5 MG EC tablet Take 4 tablets (20 mg total) by mouth once. Please see surgery prep instructions Patient not taking: Reported on 02/04/2015 09/19/14   Sherri Rad, MD  calcium carbonate (TUMS) 500 MG chewable tablet Chew 1 tablet (200 mg of elemental calcium total) by mouth daily. Patient not taking: Reported on 02/21/2015 02/15/15   Nicholes Mango, MD  polyethylene glycol powder (GLYCOLAX/MIRALAX) powder Take 255 g by mouth once. Please see surgery prep instructions given at appointment Patient not taking: Reported on 02/04/2015 09/19/14   Sherri Rad, MD      PHYSICAL EXAMINATION:   VITAL SIGNS: Blood pressure 175/101, pulse 62, temperature 97.8 F (36.6 C), temperature source Oral, resp. rate 16, height 5\' 3"  (1.6 m), weight 108.863 kg (240 lb), SpO2 96 %.  GENERAL:  65 y.o.-year-old patient lying in the bed with no acute distress.  EYES: Pupils equal, round, reactive to light and accommodation. No scleral  icterus. Extraocular muscles intact.  HEENT: Head atraumatic, normocephalic. Oropharynx and nasopharynx clear.  NECK:  Supple, no jugular venous distention. No thyroid enlargement, no tenderness.  LUNGS: Normal breath sounds bilaterally, no wheezing, rales,rhonchi or crepitation. No use of accessory muscles of respiration.  CARDIOVASCULAR: S1, S2 normal. Systolic murmur murmur rubs, or gallops.  ABDOMEN: Soft, nontender, nondistended. Bowel sounds present. No organomegaly or mass.  The is a large escar lateral to her ostomy that has split away from the skin cephalad to it. The ostomy has stool within it and there is a dark drainage on the dressing covering the area EXTREMITIES: No pedal edema, cyanosis, or clubbing.  NEUROLOGIC: Cranial nerves II through XII are intact. Muscle strength 5/5 in all extremities. Sensation intact. Gait not checked.  PSYCHIATRIC: The patient is alert and oriented x 3.  SKIN: No obvious rash, lesion, or ulcer.   LABORATORY PANEL:   CBC  Recent Labs Lab 02/21/15 1116 02/22/15 0414  WBC 9.3 10.5  HGB  8.8* 7.9*  HCT 28.0* 24.6*  PLT 366 356  MCV 82.0 83.0  MCH 25.8* 26.8  MCHC 31.4* 32.2  RDW 15.1* 14.6*  LYMPHSABS 0.7*  --   MONOABS 0.5  --   EOSABS 0.2  --   BASOSABS 0.1  --    ------------------------------------------------------------------------------------------------------------------  Chemistries   Recent Labs Lab 02/21/15 1116 02/22/15 0414  NA 140 143  K 4.3 4.2  CL 105 108  CO2 25 26  GLUCOSE 134* 73  BUN 23* 19  CREATININE 1.99* 1.77*  CALCIUM 6.5* 6.5*  MG  --  1.6*  AST 12* 14*  ALT 12* 13*  ALKPHOS 88 86  BILITOT 0.6 0.7   ------------------------------------------------------------------------------------------------------------------ estimated creatinine clearance is 38 mL/min (by C-G formula based on Cr of  1.77). ------------------------------------------------------------------------------------------------------------------ No results for input(s): TSH, T4TOTAL, T3FREE, THYROIDAB in the last 72 hours.  Invalid input(s): FREET3   Coagulation profile  Recent Labs Lab 02/22/15 0414  INR 1.28   ------------------------------------------------------------------------------------------------------------------- No results for input(s): DDIMER in the last 72 hours. -------------------------------------------------------------------------------------------------------------------  Cardiac Enzymes No results for input(s): CKMB, TROPONINI, MYOGLOBIN in the last 168 hours.  Invalid input(s): CK ------------------------------------------------------------------------------------------------------------------ Invalid input(s): POCBNP  ---------------------------------------------------------------------------------------------------------------  Urinalysis    Component Value Date/Time   COLORURINE YELLOW* 02/21/2015 1116   COLORURINE Straw 05/08/2013 1410   APPEARANCEUR HAZY* 02/21/2015 1116   APPEARANCEUR Hazy 05/08/2013 1410   LABSPEC 1.006 02/21/2015 1116   LABSPEC 1.006 05/08/2013 1410   PHURINE 6.0 02/21/2015 1116   PHURINE 6.0 05/08/2013 1410   GLUCOSEU NEGATIVE 02/21/2015 1116   GLUCOSEU 50 mg/dL 05/08/2013 1410   HGBUR 1+* 02/21/2015 1116   HGBUR 2+ 05/08/2013 1410   BILIRUBINUR NEGATIVE 02/21/2015 1116   BILIRUBINUR Negative 05/08/2013 1410   KETONESUR NEGATIVE 02/21/2015 1116   KETONESUR Negative 05/08/2013 1410   PROTEINUR 100* 02/21/2015 1116   PROTEINUR 100 mg/dL 05/08/2013 1410   NITRITE NEGATIVE 02/21/2015 1116   NITRITE Negative 05/08/2013 1410   LEUKOCYTESUR NEGATIVE 02/21/2015 1116   LEUKOCYTESUR 1+ 05/08/2013 1410     RADIOLOGY: Ct Abdomen Pelvis Wo Contrast  02/21/2015  CLINICAL DATA:  foul smelling drainage from surgical incision site, history of hernia  repair left abdominal wall EXAM: CT ABDOMEN AND PELVIS WITHOUT CONTRAST TECHNIQUE: Multidetector CT imaging of the abdomen and pelvis was performed following the standard protocol without IV contrast. COMPARISON:  02/10/2015 FINDINGS: Lung bases are unremarkable. Mild anasarca infiltration of subcutaneous fat bilateral flank wall. There are streaky artifacts from patient's large body habitus. Mild degenerative changes lower thoracic spine. Unenhanced liver shows no biliary ductal dilatation. The patient is status postcholecystectomy. The pancreas, spleen and adrenal glands are unremarkable. Atherosclerotic calcifications of distal abdominal aorta and iliac arteries. Normal appendix is partially visualized in axial image 58. There is no pericecal inflammation. Some colonic stool noted in right colon. Colonic diverticula are noted right colon and transverse colon. 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 This is best seen in axial image 51. There is small parastomal hernia containing fat measures about 4.8 cm. Again noted stranding of subcutaneous fat and small amount of fluid just left lateral to colostomy. There is increased subcutaneous air with bubbly appearance extending superficial to left anterior abdominal wall at the level of skin staples. 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 There is no evidence of drainable abscess. There is skin thickening in the region of skin staples left abdominal wall suspicious for dermatitis or cellulitis.  IMPRESSION: 1. 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 Small parastomal hernia containing fat seen in axial image 47 measures 4.8 cm. 2. Again noted stranding of subcutaneous fat and small amount of fluid just left lateral to colostomy. There is increased subcutaneous air with bubbly appearance extending superficial to left anterior  abdominal wall at the level of skin staples. 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 Please see axial image 43 and axial image 47. There is no evidence of drainable abscess. 3. No hydronephrosis or hydroureter. 4. Mild anasarca infiltration of subcutaneous fat bilateral flank wall. These results were called by telephone at the time of interpretation on 02/21/2015 at 2:08 pm to Dr. Carrie Mew , who verbally acknowledged these results. Electronically Signed   By: Lahoma Crocker M.D.   On: 02/21/2015 14:08    EKG: Orders placed or performed during the hospital encounter of 02/21/15  . EKG 12-Lead  . EKG 12-Lead    IMPRESSION AND PLAN: Patient is a 66 year old African-American female admitted with wound infection  1. Preoperative evaluation: At this point patient has no cardiopulmonary symptoms does have a history of moderate aortic stenosis. She was recently evaluated by cardiology during her previous admission or further cardiopulmonary workup was needed. Again patient is at moderate risk of surgery but no further workup needed can proceed to the or.  2. Diabetes type 2: She is continued on oral therapy with glimipride at this point I will place her on ssi , d/c oral threapy due to or  3. Hypertension continue therapy with hydralazine when necessary, continue Avapro and metoprolol   4. Hypothyroidism continue with Synthroid dosing.  5. Hyperlipidemia continue atorvastatin  6. Anemia hemoglobin is progressively low will need to monitor it drops below 7 will need transfusion, he has anemia of chronic disease worsened by recent blood loss from surgery  7. Chronic renal failure: Follow renal function closely recent hospitalization she had acute renal failure on chronic renal failure avoid nephrotoxins   Miscellaneous continue Lovenox for DVT prophylaxis   All the records are reviewed and case discussed with ED  provider. Management plans discussed with the patient, family and they are in agreement.  CODE STATUS:    Code Status Orders        Start     Ordered   02/21/15 1454  Full code   Continuous     02/21/15 1453       TOTAL TIME TAKING CARE OF THIS PATIENT: 55 minutes.    Dustin Flock M.D on 02/22/2015 at 11:50 AM  Between 7am to 6pm - Pager - [phone]  After 6pm go to [email or web address] - password [email or web address] [facility]  [place] [facility]  Office  [phone]  CC: Primary care physician; Adin Hector, MD

## 2015-02-22 NOTE — Progress Notes (Signed)
Attending MD put in order for an amp of D50.

## 2015-02-22 NOTE — Anesthesia Postprocedure Evaluation (Signed)
Anesthesia Post Note  Patient: [name]  Procedure(s) Performed: Procedure(s) (LRB): DEBRIDEMENT OF ABDOMINAL WALL ABSCESS (N/A)  Patient location during evaluation: PACU Anesthesia Type: General Level of consciousness: awake Pain management: pain level controlled Vital Signs Assessment: post-procedure vital signs reviewed and stable Respiratory status: spontaneous breathing Cardiovascular status: blood pressure returned to baseline Anesthetic complications: no    Last Vitals:  Filed Vitals:   02/22/15 1946 02/22/15 2009  BP: 142/75 159/67  Pulse: 51 50  Temp: 36.4 C 36.7 C  Resp: 20 18    Last Pain:  Filed Vitals:   02/22/15 2010  PainSc: [place]

## 2015-02-22 NOTE — Anesthesia Preprocedure Evaluation (Signed)
Anesthesia Evaluation  Patient identified by MRN, date of birth, ID band Patient awake    Reviewed: Allergy & Precautions, NPO status , Patient's Chart, lab work & pertinent test results, reviewed documented beta blocker date and time   Airway Mallampati: III  TM Distance: >3 FB     Dental  (+) Chipped   Pulmonary shortness of breath, former smoker,           Cardiovascular hypertension, Pt. on medications and Pt. on home beta blockers      Neuro/Psych Anxiety    GI/Hepatic   Endo/Other  diabetesHyperthyroidism   Renal/GU Renal InsufficiencyRenal disease     Musculoskeletal  (+) Arthritis ,   Abdominal   Peds  Hematology  (+) anemia ,   Anesthesia Other Findings   Reproductive/Obstetrics                             Anesthesia Physical Anesthesia Plan  ASA: III  Anesthesia Plan: General   Post-op Pain Management:    Induction: Intravenous  Airway Management Planned: Oral ETT and LMA  Additional Equipment:   Intra-op Plan:   Post-operative Plan:   Informed Consent: I have reviewed the patients History and Physical, chart, labs and discussed the procedure including the risks, benefits and alternatives for the proposed anesthesia with the patient or authorized representative who has indicated his/her understanding and acceptance.     Plan Discussed with: CRNA  Anesthesia Plan Comments:         Anesthesia Quick Evaluation

## 2015-02-22 NOTE — Progress Notes (Signed)
Surgery delayed until late last evening due to emergency cases. Plan I and D of abdominal wall later today with Dr Adonis Huguenin.

## 2015-02-22 NOTE — Brief Op Note (Signed)
02/21/2015 - 02/22/2015  6:27 PM  PATIENT:  Bufford Spikes  65 y.o. female  PRE-OPERATIVE DIAGNOSIS:  Abdominal wall abscess  POST-OPERATIVE DIAGNOSIS:  Abdominal wall abscess  PROCEDURE:  Procedure(s): DEBRIDEMENT OF ABDOMINAL WALL ABSCESS (N/A)  SURGEON:  Surgeon(s) and Role:    * Clayburn Pert, MD - Primary    * Nestor Lewandowsky, MD - Assisting  PHYSICIAN ASSISTANT:   ASSISTANTS: none   ANESTHESIA:   general  EBL:  Total I/O In: 1368 [I.V.:1368] Out: [id number] [Urine:1275]  BLOOD ADMINISTERED:none  DRAINS: (15fr) Blake drain(s) in the ostomy   LOCAL MEDICATIONS USED:  NONE  SPECIMEN:  No Specimen  DISPOSITION OF SPECIMEN:  N/A  COUNTS:  YES  TOURNIQUET:  * No tourniquets in log *  DICTATION: .Dragon Dictation  PLAN OF CARE: return to inpatient status  PATIENT DISPOSITION:  PACU - hemodynamically stable.   Delay start of Pharmacological VTE agent (>24hrs) due to surgical blood loss or risk of bleeding: no

## 2015-02-23 ENCOUNTER — Encounter: Payer: Self-pay | Admitting: General Surgery

## 2015-02-23 LAB — GLUCOSE, CAPILLARY
GLUCOSE-CAPILLARY: 132 mg/dL — AB (ref 65–99)
GLUCOSE-CAPILLARY: 82 mg/dL (ref 65–99)
Glucose-Capillary: 120 mg/dL — ABNORMAL HIGH (ref 65–99)
Glucose-Capillary: 143 mg/dL — ABNORMAL HIGH (ref 65–99)
Glucose-Capillary: 93 mg/dL (ref 65–99)

## 2015-02-23 LAB — CBC
HCT: 24.2 % — ABNORMAL LOW (ref 35.0–47.0)
Hemoglobin: 7.7 g/dL — ABNORMAL LOW (ref 12.0–16.0)
MCH: 26.7 pg (ref 26.0–34.0)
MCHC: 32 g/dL (ref 32.0–36.0)
MCV: 83.7 fL (ref 80.0–100.0)
PLATELETS: 342 10*3/uL (ref 150–440)
RBC: 2.89 MIL/uL — ABNORMAL LOW (ref 3.80–5.20)
RDW: 15.2 % — AB (ref 11.5–14.5)
WBC: 9.8 10*3/uL (ref 3.6–11.0)

## 2015-02-23 LAB — BASIC METABOLIC PANEL
Anion gap: 8 (ref 5–15)
BUN: 16 mg/dL (ref 6–20)
CALCIUM: 6.5 mg/dL — AB (ref 8.9–10.3)
CHLORIDE: 106 mmol/L (ref 101–111)
CO2: 25 mmol/L (ref 22–32)
CREATININE: 1.91 mg/dL — AB (ref 0.44–1.00)
GFR calc non Af Amer: 27 mL/min — ABNORMAL LOW (ref >60)
GFR, EST AFRICAN AMERICAN: 31 mL/min — AB (ref >60)
Glucose, Bld: 142 mg/dL — ABNORMAL HIGH (ref 65–99)
Potassium: 4.2 mmol/L (ref 3.5–5.1)
SODIUM: 139 mmol/L (ref 135–145)

## 2015-02-23 MED ORDER — SODIUM CHLORIDE 0.9 % IV SOLN
INTRAVENOUS | Status: DC
  Administered 2015-02-23 – 2015-02-24 (×2): via INTRAVENOUS

## 2015-02-23 MED ORDER — ACETAMINOPHEN 325 MG PO TABS
650.0000 mg | ORAL_TABLET | Freq: Four times a day (QID) | ORAL | Status: DC | PRN
  Administered 2015-03-11 (×2): 650 mg via ORAL
  Filled 2015-02-23 (×2): qty 2

## 2015-02-23 MED ORDER — HYDRALAZINE HCL 20 MG/ML IJ SOLN: 10.0000 mg | Freq: Four times a day (QID) | INTRAMUSCULAR | Status: DC | PRN

## 2015-02-23 MED ORDER — METOPROLOL SUCCINATE ER 50 MG PO TB24
50.0000 mg | ORAL_TABLET | Freq: Two times a day (BID) | ORAL | Status: DC
  Administered 2015-02-23 – 2015-03-13 (×36): 50 mg via ORAL
  Filled 2015-02-23 (×37): qty 1

## 2015-02-23 MED ORDER — GLIMEPIRIDE 2 MG PO TABS: 2.0000 mg | ORAL_TABLET | Freq: Two times a day (BID) | ORAL | Status: DC

## 2015-02-23 NOTE — Care Management Important Message (Signed)
Important Message  Patient Details  Name: [name] MRN: [id number] Date of Birth: 29-Jul-1950   Medicare Important Message Given:  Yes    Alvie Heidelberg, RN 02/23/2015, 10:08 AM

## 2015-02-23 NOTE — Progress Notes (Signed)
Inpatient Diabetes Program Recommendations  AACE/ADA: New Consensus Statement on Inpatient Glycemic Control (2015)  Target Ranges:  Prepandial:   less than 140 mg/dL      Peak postprandial:   less than 180 mg/dL (1-2 hours)      Critically ill patients:  140 - 180 mg/dL   Review of Glycemic Control  Results for MELESSA, ECKELMAN (MRN [id number]) as of 02/23/2015 13:38  Ref. Range 02/22/2015 18:33 02/23/2015 00:27 02/23/2015 05:59 02/23/2015 07:54 02/23/2015 11:33  Glucose-Capillary Latest Ref Range: 65-99 mg/dL 100 (H) 82 120 (H) 132 (H) 143 (H)    Diabetes history: Type 2 Outpatient Diabetes medications: Amaryl 4mg  bid Current orders for Inpatient glycemic control: Novolog 0-9 units tid , Novolog 0-5 units qhs, Amaryl 2mg  bid  Inpatient Diabetes Program Recommendations:Fasting blood sugar 132mg /dl this am. Only on clear fluids- MD has ordered Amaryl to begin tonight- consider discontinuing Amaryl and increasing Novolog correction to moderate correction 0-15 units tid.  Gentry Fitz, RN, BA, MHA, CDE Diabetes Coordinator Inpatient Diabetes Program  [phone] (Team Pager) [phone] ([place]) 02/23/2015 1:42 PM

## 2015-02-23 NOTE — Progress Notes (Signed)
Md order to not give Toprol due to low HR.

## 2015-02-23 NOTE — Progress Notes (Signed)
1 Day Post-Op   Subjective:  Patient did well overnight. On complaint today is of being hungry. He understands the operative findings from yesterday.  Vital signs in last 24 hours: Temp:  [97.4 F (36.3 C)-98.1 F (36.7 C)] 98 F (36.7 C) (01/05 1221) Pulse Rate:  [50-62] 54 (01/05 1221) Resp:  [11-20] 16 (01/05 1221) BP: (131-176)/(65-85) 161/73 mmHg (01/05 1221) SpO2:  [89 %-100 %] 96 % (01/05 1221) Last BM Date:  (blake drain in stoma)  Intake/Output from previous day: 01/04 0701 - 01/05 0700 In: 2068 [I.V.:2068] Out: 1635 [Urine:1625; Blood:10]  Physical exam: Gen.: No acute distress Chest: Clear to auscultation Heart: Regular rhythm GI: Very obese, soft, appropriately tender to palpation at left upper quadrant wound, dressing in place that is clean, dry, intact with drain in place to ostomy without any stool output as of yet.  Lab Results:  CBC  Recent Labs  02/22/15 0414 02/23/15 0403  WBC 10.5 9.8  HGB 7.9* 7.7*  HCT 24.6* 24.2*  PLT 356 342   CMP     Component Value Date/Time   NA 139 02/23/2015 0403   NA 143 07/19/2013 0523   K 4.2 02/23/2015 0403   K 3.7 07/19/2013 0523   CL 106 02/23/2015 0403   CL 109* 07/19/2013 0523   CO2 25 02/23/2015 0403   CO2 29 07/19/2013 0523   GLUCOSE 142* 02/23/2015 0403   GLUCOSE 110* 07/19/2013 0523   BUN 16 02/23/2015 0403   BUN 8 07/19/2013 0523   CREATININE 1.91* 02/23/2015 0403   CREATININE 1.58* 07/19/2013 0523   CALCIUM 6.5* 02/23/2015 0403   CALCIUM 7.8* 07/19/2013 0523   PROT 6.4* 02/22/2015 0414   PROT 5.4* 07/19/2013 0523   ALBUMIN 2.3* 02/22/2015 0414   ALBUMIN 2.1* 07/19/2013 0523   AST 14* 02/22/2015 0414   AST 16 07/19/2013 0523   ALT 13* 02/22/2015 0414   ALT 7* 07/19/2013 0523   ALKPHOS 86 02/22/2015 0414   ALKPHOS 66 07/19/2013 0523   BILITOT 0.7 02/22/2015 0414   BILITOT 0.3 07/19/2013 0523   GFRNONAA 27* 02/23/2015 0403   GFRNONAA 34* 07/19/2013 0523   GFRAA 31* 02/23/2015 0403   GFRAA  40* 07/19/2013 0523   PT/INR  Recent Labs  02/22/15 0414  LABPROT 16.1*  INR 1.28    Studies/Results: No results found.  Assessment/Plan: 65 year old female status post debridement of infected left upper quadrant abdominal wound around her ostomy. Wound debridement yesterday in the operating room. We will be difficult to manage secondary to her thick abdominal wall and that the viable ostomy is at the level of the fascia in the middle of this infected field. We'll continue twice a day wet-to-dry's today. Plan to return the operating room tomorrow for repeat I&D and attempts for wound VAC placement. Appreciate internal medicine assistance with her chronic medical problems.   Clayburn Pert, MD FACS General Surgeon  02/23/2015

## 2015-02-23 NOTE — Progress Notes (Signed)
[place] at [place] NAME: [name]    MR#:  [id number]  DATE OF BIRTH:  Oct 16, 1950  SUBJECTIVE:  CHIEF COMPLAINT:   Chief Complaint  Patient presents with  . Wound Infection   Mild abd pain at surgical site. Tolerating diet  REVIEW OF SYSTEMS:    Review of Systems  Constitutional: Negative for fever and chills.  HENT: Negative for sore throat.   Eyes: Negative for blurred vision, double vision and pain.  Respiratory: Negative for cough, hemoptysis, shortness of breath and wheezing.   Cardiovascular: Negative for chest pain, palpitations, orthopnea and leg swelling.  Gastrointestinal: Positive for abdominal pain. Negative for heartburn, nausea, vomiting, diarrhea and constipation.  Genitourinary: Negative for dysuria and hematuria.  Musculoskeletal: Negative for back pain and joint pain.  Skin: Negative for rash.  Neurological: Negative for sensory change, speech change, focal weakness and headaches.  Endo/Heme/Allergies: Does not bruise/bleed easily.  Psychiatric/Behavioral: Negative for depression. The patient is not nervous/anxious.       DRUG ALLERGIES:   Allergies  Allergen Reactions  . Buspirone Other (See Comments)    Weakness  . Citalopram Other (See Comments)  . Lisinopril Cough  . Metformin Diarrhea    At 1000 mg dose  . Pravastatin Other (See Comments)    insomnia  . Sitagliptin Other (See Comments)    constipation  . Tramadol Itching  . Diltiazem Hcl Palpitations  . Gabapentin Palpitations  . Lovastatin Palpitations    VITALS:  Blood pressure 152/70, pulse 56, temperature 98.1 F (36.7 C), temperature source Oral, resp. rate 16, height 5\' 3"  (1.6 m), weight 108.863 kg (240 lb), SpO2 97 %.  PHYSICAL EXAMINATION:   Physical Exam  GENERAL:  65 y.o.-year-old patient lying in the bed with no acute distress.  EYES: Pupils equal, round, reactive to light and accommodation. No scleral icterus.  Extraocular muscles intact.  HEENT: Head atraumatic, normocephalic. Oropharynx and nasopharynx clear.  NECK:  Supple, no jugular venous distention. No thyroid enlargement, no tenderness.  LUNGS: Normal breath sounds bilaterally, no wheezing, rales, rhonchi. No use of accessory muscles of respiration.  CARDIOVASCULAR: S1, S2 normal. No murmurs, rubs, or gallops.  ABDOMEN: Soft. Staples from recent surgery. Dressing over surgical site. EXTREMITIES: No cyanosis, clubbing or edema b/l.    NEUROLOGIC: Cranial nerves II through XII are intact. No focal Motor or sensory deficits b/l.   PSYCHIATRIC: The patient is alert and oriented x 3.  SKIN: No obvious rash, lesion, or ulcer.    LABORATORY PANEL:   CBC  Recent Labs Lab 02/23/15 0403  WBC 9.8  HGB 7.7*  HCT 24.2*  PLT 342   ------------------------------------------------------------------------------------------------------------------  Chemistries   Recent Labs Lab 02/22/15 0414 02/23/15 0403  NA 143 139  K 4.2 4.2  CL 108 106  CO2 26 25  GLUCOSE 73 142*  BUN 19 16  CREATININE 1.77* 1.91*  CALCIUM 6.5* 6.5*  MG 1.6*  --   AST 14*  --   ALT 13*  --   ALKPHOS 86  --   BILITOT 0.7  --    ------------------------------------------------------------------------------------------------------------------  Cardiac Enzymes No results for input(s): TROPONINI in the last 168 hours. ------------------------------------------------------------------------------------------------------------------  RADIOLOGY:  Ct Abdomen Pelvis Wo Contrast  02/21/2015  CLINICAL DATA:  foul smelling drainage from surgical incision site, history of hernia repair left abdominal wall EXAM: CT ABDOMEN AND PELVIS WITHOUT CONTRAST TECHNIQUE: Multidetector CT imaging of the abdomen and pelvis was performed following the standard  protocol without IV contrast. COMPARISON:  02/10/2015 FINDINGS: Lung bases are unremarkable. Mild anasarca infiltration of  subcutaneous fat bilateral flank wall. There are streaky artifacts from patient's large body habitus. Mild degenerative changes lower thoracic spine. Unenhanced liver shows no biliary ductal dilatation. The patient is status postcholecystectomy. The pancreas, spleen and adrenal glands are unremarkable. Atherosclerotic calcifications of distal abdominal aorta and iliac arteries. Normal appendix is partially visualized in axial image 58. There is no pericecal inflammation. Some colonic stool noted in right colon. Colonic diverticula are noted right colon and transverse colon. 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 This is best seen in axial image 51. There is small parastomal hernia containing fat measures about 4.8 cm. Again noted stranding of subcutaneous fat and small amount of fluid just left lateral to colostomy. There is increased subcutaneous air with bubbly appearance extending superficial to left anterior abdominal wall at the level of skin staples. 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 There is no evidence of drainable abscess. There is skin thickening in the region of skin staples left abdominal wall suspicious for dermatitis or cellulitis. IMPRESSION: 1. 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 Small parastomal hernia containing fat seen in axial image 47 measures 4.8 cm. 2. Again noted stranding of subcutaneous fat and small amount of fluid just left lateral to colostomy. There is increased subcutaneous air with bubbly appearance extending superficial to left anterior abdominal wall at the level of skin staples. 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 Please see axial image 43 and axial image 47. There is no evidence of drainable abscess. 3. No hydronephrosis or hydroureter. 4. Mild  anasarca infiltration of subcutaneous fat bilateral flank wall. These results were called by telephone at the time of interpretation on 02/21/2015 at 2:08 pm to Dr. Carrie Mew , who verbally acknowledged these results. Electronically Signed   By: Lahoma Crocker M.D.   On: 02/21/2015 14:08     ASSESSMENT AND PLAN:   IMPRESSION AND PLAN: Patient is a 65 year old African-American female admitted with wound infection  1. Abdominal wall abscess Per surgery  2. Diabetes type 2:  SSI Glimeperide lowered to 2mg  BID. Can increased to 4mg  BID when on full diet  3. Hypertension continue therapy with hydralazine when necessary, continue Avapro and metoprolol  4. Hypothyroidism continue with Synthroid dosing.   5. Hyperlipidemia continue atorvastatin  6. Anemia  Transfuse if < 7  7. Chronic renal failure:stable   All the records are reviewed and case discussed with Care Management/Social Workerr. Management plans discussed with the patient, family and they are in agreement.  DVT Prophylaxis: SCDs  TOTAL TIME TAKING CARE OF THIS PATIENT: 35 minutes.    Hillary Bow R M.D on 02/23/2015 at 9:58 AM  Between 7am to 6pm - Pager - [phone]  After 6pm go to [email or web address] - password [email or web address] [facility]  Office  [phone]  CC: Primary care physician; Adin Hector, MD    Note: This dictation was prepared with Dragon dictation along with smaller phrase technology. Any transcriptional errors that result from this process are unintentional.

## 2015-02-24 ENCOUNTER — Encounter: Payer: Self-pay | Admitting: Anesthesiology

## 2015-02-24 ENCOUNTER — Encounter
Admission: EM | Disposition: A | Discharge status: Home Health | Payer: Self-pay | Source: Home / Self Care | Attending: General Surgery

## 2015-02-24 ENCOUNTER — Inpatient Hospital Stay: Payer: PPO | Admitting: Anesthesiology

## 2015-02-24 DIAGNOSIS — L03311 Cellulitis of abdominal wall: Secondary | ICD-10-CM

## 2015-02-24 HISTORY — PX: APPLICATION OF WOUND VAC: SHX5189

## 2015-02-24 HISTORY — PX: EXCISION MASS ABDOMINAL: SHX6701

## 2015-02-24 LAB — GLUCOSE, CAPILLARY
GLUCOSE-CAPILLARY: 110 mg/dL — AB (ref 65–99)
GLUCOSE-CAPILLARY: 56 mg/dL — AB (ref 65–99)
GLUCOSE-CAPILLARY: 61 mg/dL — AB (ref 65–99)
Glucose-Capillary: 102 mg/dL — ABNORMAL HIGH (ref 65–99)
Glucose-Capillary: 65 mg/dL (ref 65–99)
Glucose-Capillary: 80 mg/dL (ref 65–99)
Glucose-Capillary: 80 mg/dL (ref 65–99)
Glucose-Capillary: 113 mg/dL — ABNORMAL HIGH (ref 65–99)
Glucose-Capillary: 127 mg/dL — ABNORMAL HIGH (ref 65–99)

## 2015-02-24 SURGERY — EXCISION, MASS, TORSO
Anesthesia: General | Wound class: Clean Contaminated

## 2015-02-24 MED ORDER — HYDRALAZINE HCL 20 MG/ML IJ SOLN
INTRAMUSCULAR | Status: AC
  Administered 2015-02-24: 10 mg via INTRAVENOUS
  Filled 2015-02-24: qty 1

## 2015-02-24 MED ORDER — LIDOCAINE HCL (CARDIAC) 20 MG/ML IV SOLN
INTRAVENOUS | Status: DC | PRN
  Administered 2015-02-24: 100 mg via INTRAVENOUS

## 2015-02-24 MED ORDER — DEXTROSE 50 % IV SOLN
25.0000 mL | Freq: Once | INTRAVENOUS | Status: AC
  Administered 2015-02-24: 25 mL via INTRAVENOUS
  Filled 2015-02-24: qty 50

## 2015-02-24 MED ORDER — PROPOFOL 10 MG/ML IV BOLUS
INTRAVENOUS | Status: DC | PRN
  Administered 2015-02-24: 120 mg via INTRAVENOUS

## 2015-02-24 MED ORDER — OXYCODONE HCL 5 MG PO TABS: 5.0000 mg | ORAL_TABLET | Freq: Once | ORAL | Status: DC | PRN

## 2015-02-24 MED ORDER — KCL IN DEXTROSE-NACL 20-5-0.45 MEQ/L-%-% IV SOLN
INTRAVENOUS | Status: DC
  Administered 2015-02-24 – 2015-02-25 (×3): via INTRAVENOUS
  Filled 2015-02-24 (×6): qty 1000

## 2015-02-24 MED ORDER — GLYCOPYRROLATE 0.2 MG/ML IJ SOLN
INTRAMUSCULAR | Status: DC | PRN
  Administered 2015-02-24: 0.2 mg via INTRAVENOUS

## 2015-02-24 MED ORDER — SUCCINYLCHOLINE CHLORIDE 20 MG/ML IJ SOLN
INTRAMUSCULAR | Status: DC | PRN
  Administered 2015-02-24: 100 mg via INTRAVENOUS

## 2015-02-24 MED ORDER — FENTANYL CITRATE (PF) 100 MCG/2ML IJ SOLN
25.0000 ug | INTRAMUSCULAR | Status: DC | PRN
  Administered 2015-02-24 (×3): 50 ug via INTRAVENOUS

## 2015-02-24 MED ORDER — FENTANYL CITRATE (PF) 100 MCG/2ML IJ SOLN
INTRAMUSCULAR | Status: DC | PRN
  Administered 2015-02-24: 100 ug via INTRAVENOUS

## 2015-02-24 MED ORDER — OXYCODONE HCL 5 MG/5ML PO SOLN: 5.0000 mg | Freq: Once | ORAL | Status: DC | PRN

## 2015-02-24 MED ORDER — HYDRALAZINE HCL 20 MG/ML IJ SOLN
10.0000 mg | Freq: Once | INTRAMUSCULAR | Status: AC
  Administered 2015-02-24: 10 mg via INTRAVENOUS

## 2015-02-24 MED ORDER — FENTANYL CITRATE (PF) 100 MCG/2ML IJ SOLN
INTRAMUSCULAR | Status: AC
  Administered 2015-02-24: 50 ug via INTRAVENOUS
  Filled 2015-02-24: qty 2

## 2015-02-24 MED ORDER — FENTANYL CITRATE (PF) 100 MCG/2ML IJ SOLN
INTRAMUSCULAR | Status: AC
  Filled 2015-02-24: qty 2

## 2015-02-24 SURGICAL SUPPLY — 23 items
BLADE SURG 15 STRL LF DISP TIS (BLADE) ×1 IMPLANT
BLADE SURG 15 STRL SS (BLADE) ×2
BNDG GAUZE 4.5X4.1 6PLY STRL (MISCELLANEOUS) ×3 IMPLANT
CANISTER SUCT 1200ML W/VALVE (MISCELLANEOUS) ×3 IMPLANT
CHLORAPREP W/TINT 26ML (MISCELLANEOUS) ×3 IMPLANT
DRAIN CHANNEL 32F RND 10.7 FF (WOUND CARE) ×3 IMPLANT
DRAPE LAPAROTOMY T 102X78X121 (DRAPES) ×3 IMPLANT
GLOVE BIO SURGEON STRL SZ7.5 (GLOVE) ×3 IMPLANT
GLOVE INDICATOR 8.0 STRL GRN (GLOVE) ×3 IMPLANT
GOWN STRL REUS W/ TWL LRG LVL3 (GOWN DISPOSABLE) ×2 IMPLANT
GOWN STRL REUS W/TWL LRG LVL3 (GOWN DISPOSABLE) ×4
KIT RM TURNOVER STRD PROC AR (KITS) ×3 IMPLANT
LIQUID BAND (GAUZE/BANDAGES/DRESSINGS) ×3 IMPLANT
NDL SAFETY 22GX1.5 (NEEDLE) ×3 IMPLANT
PACK BASIN MINOR ARMC (MISCELLANEOUS) ×3 IMPLANT
PAD GROUND ADULT SPLIT (MISCELLANEOUS) ×3 IMPLANT
SPONGE LAP 18X18 5 PK (GAUZE/BANDAGES/DRESSINGS) ×3 IMPLANT
STAPLER SKIN PROX 35W (STAPLE) ×3 IMPLANT
SUT MNCRL AB 4-0 PS2 18 (SUTURE) IMPLANT
SUT PROLENE 0 CT 1 30 (SUTURE) ×3 IMPLANT
SUT VIC AB 2-0 SH 27 (SUTURE)
SUT VIC AB 2-0 SH 27XBRD (SUTURE) IMPLANT
SYRINGE 10CC LL (SYRINGE) ×3 IMPLANT

## 2015-02-24 NOTE — Progress Notes (Signed)
Hypoglycemic Event  CBG: 56  Treatment: D50 IV 25 mL  Symptoms: None  Follow-up CBG: Time: 0058 CBG Result:110  Possible Reasons for Event: Inadequate meal intake  Comments/MD notified: MD not notified, hypoglycemia resolved after 1st intervention per hypoglycemic event protocol. Will continue to monitor pt.

## 2015-02-24 NOTE — Progress Notes (Signed)
[place] at [place] NAME: [name]    MR#:  [id number]  DATE OF BIRTH:  1950/05/18  SUBJECTIVE:  CHIEF COMPLAINT:   Chief Complaint  Patient presents with  . Wound Infection   Mild pain at surgical site. OR later today  REVIEW OF SYSTEMS:    Review of Systems  Constitutional: Negative for fever and chills.  HENT: Negative for sore throat.   Eyes: Negative for blurred vision, double vision and pain.  Respiratory: Negative for cough, hemoptysis, shortness of breath and wheezing.   Cardiovascular: Negative for chest pain, palpitations, orthopnea and leg swelling.  Gastrointestinal: Positive for abdominal pain. Negative for heartburn, nausea, vomiting, diarrhea and constipation.  Genitourinary: Negative for dysuria and hematuria.  Musculoskeletal: Negative for back pain and joint pain.  Skin: Negative for rash.  Neurological: Negative for sensory change, speech change, focal weakness and headaches.  Endo/Heme/Allergies: Does not bruise/bleed easily.  Psychiatric/Behavioral: Negative for depression. The patient is not nervous/anxious.       DRUG ALLERGIES:   Allergies  Allergen Reactions  . Buspirone Other (See Comments)    Weakness  . Citalopram Other (See Comments)  . Lisinopril Cough  . Metformin Diarrhea    At 1000 mg dose  . Pravastatin Other (See Comments)    insomnia  . Sitagliptin Other (See Comments)    constipation  . Tramadol Itching  . Diltiazem Hcl Palpitations  . Gabapentin Palpitations  . Lovastatin Palpitations    VITALS:  Blood pressure 158/68, pulse 55, temperature 97.7 F (36.5 C), temperature source Oral, resp. rate 16, height 5\' 3"  (1.6 m), weight 108.863 kg (240 lb), SpO2 95 %.  PHYSICAL EXAMINATION:   Physical Exam  GENERAL:  65 y.o.-year-old patient lying in the bed with no acute distress.  EYES: Pupils equal, round, reactive to light and accommodation. No scleral icterus.  Extraocular muscles intact.  HEENT: Head atraumatic, normocephalic. Oropharynx and nasopharynx clear.  NECK:  Supple, no jugular venous distention. No thyroid enlargement, no tenderness.  LUNGS: Normal breath sounds bilaterally, no wheezing, rales, rhonchi. No use of accessory muscles of respiration.  CARDIOVASCULAR: S1, S2 normal. No murmurs, rubs, or gallops.  ABDOMEN: Soft. Staples from recent surgery. Dressing over surgical site. EXTREMITIES: No cyanosis, clubbing or edema b/l.    NEUROLOGIC: Cranial nerves II through XII are intact. No focal Motor or sensory deficits b/l.   PSYCHIATRIC: The patient is alert and oriented x 3.  SKIN: No obvious rash, lesion, or ulcer.    LABORATORY PANEL:   CBC  Recent Labs Lab 02/23/15 0403  WBC 9.8  HGB 7.7*  HCT 24.2*  PLT 342   ------------------------------------------------------------------------------------------------------------------  Chemistries   Recent Labs Lab 02/22/15 0414 02/23/15 0403  NA 143 139  K 4.2 4.2  CL 108 106  CO2 26 25  GLUCOSE 73 142*  BUN 19 16  CREATININE 1.77* 1.91*  CALCIUM 6.5* 6.5*  MG 1.6*  --   AST 14*  --   ALT 13*  --   ALKPHOS 86  --   BILITOT 0.7  --    ------------------------------------------------------------------------------------------------------------------  Cardiac Enzymes No results for input(s): TROPONINI in the last 168 hours. ------------------------------------------------------------------------------------------------------------------  RADIOLOGY:  No results found.   ASSESSMENT AND PLAN:   IMPRESSION AND PLAN: Patient is a 65 year old African-American female admitted with wound infection  1. Abdominal wall abscess Per surgery OR again tday  2. Diabetes type 2:  SSI Off glimepiride and insulin. Can restart  able to tolerate PO  3. Hypertension continue therapy with hydralazine when necessary, continue Avapro and metoprolol  4. Hypothyroidism continue  with Synthroid dosing.   5. Hyperlipidemia continue atorvastatin  6. Anemia  Transfuse if < 7  7. Chronic renal failure:stable   All the records are reviewed and case discussed with Care Management/Social Workerr. Management plans discussed with the patient, family and they are in agreement.  DVT Prophylaxis: SCDs  TOTAL TIME TAKING CARE OF THIS PATIENT: 35 minutes.    Hillary Bow R M.D on 02/24/2015 at 11:42 AM  Between 7am to 6pm - Pager - [phone]  After 6pm go to [email or web address] - password [email or web address] [facility]  Office  [phone]  CC: Primary care physician; Adin Hector, MD    Note: This dictation was prepared with Dragon dictation along with smaller phrase technology. Any transcriptional errors that result from this process are unintentional.

## 2015-02-24 NOTE — Transfer of Care (Signed)
Immediate Anesthesia Transfer of Care Note  Patient: [name]  Procedure(s) Performed: Procedure(s): EXCISION MASS ABDOMINAL  / [place] OUT (N/A) APPLICATION OF WOUND VAC (N/A)  Patient Location: PACU  Anesthesia Type:General  Level of Consciousness: awake, alert , oriented and patient cooperative  Airway & Oxygen Therapy: Patient Spontanous Breathing and Patient connected to nasal cannula oxygen  Post-op Assessment: Report given to RN, Post -op Vital signs reviewed and stable and Patient moving all extremities  Post vital signs: Reviewed and stable  Last Vitals:  Filed Vitals:   02/24/15 1125 02/24/15 1356  BP: 158/68 161/80  Pulse: 55 77  Temp: 36.5 C 36.3 C  Resp: 16 15    Complications: No apparent anesthesia complications

## 2015-02-24 NOTE — Brief Op Note (Signed)
02/21/2015 - 02/24/2015  1:49 PM  PATIENT:  [name]  65 y.o. female  PRE-OPERATIVE DIAGNOSIS:   Large abdominal wound  POST-OPERATIVE DIAGNOSIS:   Large abdominal wound to the left upper quadrant around her ostomy  PROCEDURE:  Procedure(s): EXCISION MASS ABDOMINAL  / [place] (N/A)   SURGEON:  Surgeon(s) and Role:    * Clayburn Pert, MD - Primary  PHYSICIAN ASSISTANT:   ASSISTANTS: none   ANESTHESIA:   general  EBL:  Total I/O In: 938.8 [I.V.:938.8] Out: 10 [Blood:10]  BLOOD ADMINISTERED:none  DRAINS: (15 [place]) Blake drain(s) in the Ostomy lumen   LOCAL MEDICATIONS USED:  NONE  SPECIMEN:  No Specimen  DISPOSITION OF SPECIMEN:  N/A  COUNTS:  YES  TOURNIQUET:  * No tourniquets in log *  DICTATION: .Dragon Dictation  PLAN OF CARE: Return to inpatient status  PATIENT DISPOSITION:  PACU - hemodynamically stable.   Delay start of Pharmacological VTE agent (>24hrs) due to surgical blood loss or risk of bleeding: no

## 2015-02-24 NOTE — Op Note (Signed)
  Pre-operative Diagnosis: abdominal wall abscess  Post-operative Diagnosis:Large abdominal wound to the left upper quadrant around her ostomy with additional necrosis  Surgeon: Clayburn Pert   Assistants: None  Anesthesia: General endotracheal anesthesia  ASA Class: 3  Surgeon: Clayburn Pert, MD FACS  Anesthesia: Gen. with endotracheal tube  Assistant: none  Procedure Details  The patient was seen again in the Holding Room. The benefits, complications, treatment options, and expected outcomes were discussed with the patient. The risks of bleeding, infection, recurrence of symptoms, failure to resolve symptoms, bowel injury, any of which could require further surgery were reviewed with the patient. The patient was taken to Operating Room, identified as ZHANIYAH SEARING and the procedure verified. A Time Out was held and the above information confirmed.  Prior to the induction of general anesthesia, antibiotic prophylaxis was administered. VTE prophylaxis was in place. General endotracheal anesthesia was then administered and tolerated well. After the induction, the abdomen was prepped with Betadine and draped in the sterile fashion. The patient was positioned in the supine position.  The previously placed drain had become dislodge causing a large volume of stool to spill into the open wound. The obviously necrotic tissue was removed using combination of blunt dissection and Bovie much cautery. The previously noticed ostomy at the fascia was again noted to be pink, viable, healthy. The additional necrotic tissue was noted to be in the most medial and lateral aspects of the wound. All of the necrotic material was the subcutaneous fat with an approximate 5 cm of additional necrosis to both lateral and medial.. This was all bluntly removed. The fascia was again noted to be intact throughout the entirety of the wound. The entire wound was copiously irrigated and any nonviable tissue was  bluntly removed. Meticulous hemostasis was obtained with direct electrocautery.  At this point the decision was made to continue wet-to-dry dressings. A new 40 [place] Blake drain was able be placed into the ostomy for control of stool and was secured in place the skin with a 0 Prolene suture. A Kerlix gauze was soaked in saline and place tightly into the cavity around the [place] drain. ABG pads were placed over this and secured with tape. The Blake drain was placed to a Foley bag for fecal control.  The previously placed midline staples were removed to the operating room with hemostat forceps. A small area of skin separation was noted middle of the wound which is reapproximated with a new set of surgical staples. There was some serous fluid noted coming from that area of the wound.  Findings: Additional necrotic superficial fat associated with large abdominal wall abscess   Estimated Blood Loss: 10 mL's   Drains: 32 [place] Blake drain into the ostomy   Specimens: none    Complications: none      Condition: good   Clayburn Pert, MD, FACS

## 2015-02-24 NOTE — Anesthesia Preprocedure Evaluation (Signed)
Anesthesia Evaluation  Patient identified by MRN, date of birth, ID band Patient awake    Reviewed: Allergy & Precautions, H&P , NPO status , Patient's Chart, lab work & pertinent test results  History of Anesthesia Complications Negative for: history of anesthetic complications  Airway Mallampati: III  TM Distance: >3 FB Neck ROM: limited    Dental no notable dental hx. (+) Poor Dentition, Chipped   Pulmonary shortness of breath, former smoker,    Pulmonary exam normal breath sounds clear to auscultation       Cardiovascular Exercise Tolerance: Poor hypertension, (-) angina+ PND  (-) Past MI Normal cardiovascular exam Rhythm:regular Rate:Normal     Neuro/Psych PSYCHIATRIC DISORDERS Anxiety negative neurological ROS     GI/Hepatic negative GI ROS, Neg liver ROS,   Endo/Other  diabetes, Poorly Controlled, Type 2Hyperthyroidism Morbid obesity  Renal/GU Renal disease  negative genitourinary   Musculoskeletal  (+) Arthritis ,   Abdominal   Peds  Hematology negative hematology ROS (+)   Anesthesia Other Findings Past Medical History:   Diabetes mellitus without complication (HCC)                 GI bleed                                                     Aortic valve stenosis                                        Hyperlipidemia                                               Hypertension                                                 Diverticulitis large intestine                               Obesity                                                      Shortness of breath dyspnea                                  Hyperthyroidism                                              Anxiety  Arthritis                                                    Neuropathy in diabetes ([place])                                 Anemia                                                      Vertigo                                                      Bowel obstruction [facility] [place])                                     Past Surgical History:   COLECTOMY                                                     laparotomy closure of cecal perforation          05/09/2013     Comment:Dr. Marina Gravel   TONSILLECTOMY                                                 BACK SURGERY                                                    Comment:spur frmoved from lower back   CARDIAC CATHETERIZATION                                       ABDOMINAL HYSTERECTOMY                                        LAPAROTOMY                                      N/A 02/06/2015     Comment:Procedure: Laparotomy, reduction of               incarcerated parastomal hernia, repair of               parastomal hernia with mesh;  Surgeon: Elta Guadeloupe  Marina Gravel, MD;  Location: [facility] ORS;  Service:               General;  Laterality: N/A;   DEBRIDEMENT OF ABDOMINAL WALL ABSCESS           N/A 02/22/2015       Comment:Procedure: DEBRIDEMENT OF ABDOMINAL WALL               ABSCESS;  Surgeon: Clayburn Pert, MD;                Location: [facility] ORS;  Service: General;                Laterality: N/A;  BMI    Body Mass Index   42.52 kg/m 2      Reproductive/Obstetrics negative OB ROS                             Anesthesia Physical Anesthesia Plan  ASA: III  Anesthesia Plan: General ETT   Post-op Pain Management:    Induction:   Airway Management Planned:   Additional Equipment:   Intra-op Plan:   Post-operative Plan:   Informed Consent: I have reviewed the patients History and Physical, chart, labs and discussed the procedure including the risks, benefits and alternatives for the proposed anesthesia with the patient or authorized representative who has indicated his/her understanding and acceptance.   Dental Advisory Given  Plan Discussed with: Anesthesiologist, CRNA and Surgeon  Anesthesia Plan  Comments:         Anesthesia Quick Evaluation

## 2015-02-24 NOTE — Progress Notes (Signed)
Hypoglycemic Event  CBG: 61  Treatment: D50 IV 25 mL  Symptoms: None  Follow-up CBG: Time:0723 CBG Result:102  Possible Reasons for Event: Inadequate meal intake  Comments: resolved after treating according to hypoglycemic event protocol. Will continue to monitor pt.

## 2015-02-24 NOTE — Anesthesia Procedure Notes (Signed)
Procedure Name: Intubation Date/Time: 02/24/2015 12:56 PM Performed by: Alda Berthold Pre-anesthesia Checklist: Patient identified, Patient being monitored, Timeout performed, Emergency Drugs available and Suction available Patient Re-evaluated:Patient Re-evaluated prior to inductionOxygen Delivery Method: Circle system utilized Preoxygenation: Pre-oxygenation with 100% oxygen Intubation Type: IV induction Ventilation: Mask ventilation without difficulty Laryngoscope Size: Mac and 3 Grade View: Grade III Tube type: Oral Tube size: 7.0 mm Number of attempts: 1 Airway Equipment and Method: Stylet Placement Confirmation: ETT inserted through vocal cords under direct vision,  positive ETCO2 and breath sounds checked- equal and bilateral Secured at: 21 cm Tube secured with: Tape Dental Injury: Teeth and Oropharynx as per pre-operative assessment  Comments: Recommend either a miller blade or mac magrath for future intubation for visualization

## 2015-02-24 NOTE — Progress Notes (Signed)
Inpatient Diabetes Program Recommendations  AACE/ADA: New Consensus Statement on Inpatient Glycemic Control (2015)  Target Ranges:  Prepandial:   less than 140 mg/dL      Peak postprandial:   less than 180 mg/dL (1-2 hours)      Critically ill patients:  140 - 180 mg/dL   Review of Glycemic Control  Results for [name], [name] (MRN [id number]) as of 02/24/2015 12:30  Ref. Range 02/24/2015 00:58 02/24/2015 06:28 02/24/2015 07:23 02/24/2015 10:10 02/24/2015 11:26  Glucose-Capillary Latest Ref Range: 65-99 mg/dL 110 (H) 61 (L) 102 (H) 65 80    Diabetes history: Type 2 Outpatient Diabetes medications: Amaryl 4mg  bid Current orders for Inpatient glycemic control: Novolog 0-9 units tid , Novolog 0-5 units qhs, Amaryl 2mg  bid  Inpatient Diabetes Program Recommendations:Agree with orders to d/c Amaryl and Novolog insulin currently on hold. Reassess need for Novolog correction 0-9 units tid after she returns from her procedure. Recommend not restarting the Amaryl while she is inpatient.   Gentry Fitz, RN, BA, MHA, CDE Diabetes Coordinator Inpatient Diabetes Program  [phone] (Team Pager) [phone] ([place]) 02/24/2015 12:35 PM

## 2015-02-24 NOTE — Anesthesia Postprocedure Evaluation (Signed)
Anesthesia Post Note  Patient: [name]  Procedure(s) Performed: Procedure(s) (LRB): EXCISION MASS ABDOMINAL  / [place] OUT (N/A) APPLICATION OF WOUND VAC (N/A)  Patient location during evaluation: PACU Anesthesia Type: General Level of consciousness: awake and alert Pain management: pain level controlled Vital Signs Assessment: post-procedure vital signs reviewed and stable Respiratory status: spontaneous breathing, nonlabored ventilation, respiratory function stable and patient connected to nasal cannula oxygen Cardiovascular status: blood pressure returned to baseline and stable Postop Assessment: no signs of nausea or vomiting Anesthetic complications: no    Last Vitals:  Filed Vitals:   02/24/15 1620 02/24/15 1720  BP: 144/74 155/72  Pulse: 65 66  Temp: 36.4 C 36.7 C  Resp: 18 19    Last Pain:  Filed Vitals:   02/24/15 1843  PainSc: 5                  Precious Haws Hershall Benkert

## 2015-02-24 NOTE — Progress Notes (Signed)
2 Days Post-Op   Subjective:  Patient had episodes of hypoglycemia overnight. Monitored well to dextrose infusions. Patient reports no complaints this morning other than being hungry.  Vital signs in last 24 hours: Temp:  [97.7 F (36.5 C)-98.2 F (36.8 C)] 97.7 F (36.5 C) (01/06 0621) Pulse Rate:  [54-60] 55 (01/06 0621) Resp:  [16-20] 20 (01/06 0621) BP: (147-161)/(70-87) 157/87 mmHg (01/06 0621) SpO2:  [96 %-100 %] 99 % (01/06 0621) Last BM Date:  (scant stool in straight drain to foley bag)  Intake/Output from previous day: 01/05 0701 - 01/06 0700 In: 1298.8 [I.V.:1298.8] Out: 2650 [Urine:2650]  Visible exam: Gen.: No acute distress Chest: Clear to auscultation Heart: Regular rate and rhythm GI: Abdomen is very large, soft, nontender, dressing in place left upper quadrant that is clean, dry, intact, drain and placed in the ostomy with minimal output.  Lab Results:  CBC  Recent Labs  02/22/15 0414 02/23/15 0403  WBC 10.5 9.8  HGB 7.9* 7.7*  HCT 24.6* 24.2*  PLT 356 342   CMP     Component Value Date/Time   NA 139 02/23/2015 0403   NA 143 07/19/2013 0523   K 4.2 02/23/2015 0403   K 3.7 07/19/2013 0523   CL 106 02/23/2015 0403   CL 109* 07/19/2013 0523   CO2 25 02/23/2015 0403   CO2 29 07/19/2013 0523   GLUCOSE 142* 02/23/2015 0403   GLUCOSE 110* 07/19/2013 0523   BUN 16 02/23/2015 0403   BUN 8 07/19/2013 0523   CREATININE 1.91* 02/23/2015 0403   CREATININE 1.58* 07/19/2013 0523   CALCIUM 6.5* 02/23/2015 0403   CALCIUM 7.8* 07/19/2013 0523   PROT 6.4* 02/22/2015 0414   PROT 5.4* 07/19/2013 0523   ALBUMIN 2.3* 02/22/2015 0414   ALBUMIN 2.1* 07/19/2013 0523   AST 14* 02/22/2015 0414   AST 16 07/19/2013 0523   ALT 13* 02/22/2015 0414   ALT 7* 07/19/2013 0523   ALKPHOS 86 02/22/2015 0414   ALKPHOS 66 07/19/2013 0523   BILITOT 0.7 02/22/2015 0414   BILITOT 0.3 07/19/2013 0523   GFRNONAA 27* 02/23/2015 0403   GFRNONAA 34* 07/19/2013 0523   GFRAA 31*  02/23/2015 0403   GFRAA 40* 07/19/2013 0523   PT/INR  Recent Labs  02/22/15 0414  LABPROT 16.1*  INR 1.28    Studies/Results: No results found.  Assessment/Plan: 65 year old female with a complex wound to left upper quadrant around her ostomy site. Plan for returning operative today for repeat washout and possible wound VAC placement. We'll need to work for a better form of fecal diversion then the currently placed drain. Appreciate medicine assistance with this patient's chronic medical problems.   Clayburn Pert, MD FACS General Surgeon  02/24/2015

## 2015-02-24 NOTE — Progress Notes (Signed)
ANTIBIOTIC CONSULT NOTE - FOLLOW UP  Pharmacy Consult for piperacillin/tazobactam Indication: Infected Surgical Wound  Allergies  Allergen Reactions  . Buspirone Other (See Comments)    Weakness  . Citalopram Other (See Comments)  . Lisinopril Cough  . Metformin Diarrhea    At 1000 mg dose  . Pravastatin Other (See Comments)    insomnia  . Sitagliptin Other (See Comments)    constipation  . Tramadol Itching  . Diltiazem Hcl Palpitations  . Gabapentin Palpitations  . Lovastatin Palpitations   Patient Measurements: Height: 5\' 3"  (160 cm) Weight: 240 lb (108.863 kg) IBW/kg (Calculated) : 52.4  Vital Signs: Temp: 97.4 F (36.3 C) (01/06 1011) Temp Source: Oral (01/06 0621) BP: 167/72 mmHg (01/06 1011) Pulse Rate: 55 (01/06 1011)  Labs:  Recent Labs  02/22/15 0414 02/23/15 0403  WBC 10.5 9.8  HGB 7.9* 7.7*  PLT 356 342  CREATININE 1.77* 1.91*   Estimated Creatinine Clearance: 35.2 mL/min (by C-G formula based on Cr of 1.91).  Assessment: Pharmacy dosing piperacillin/tazobactam in this 65 year old female for infected would/abscess around ostomy site. Patient underwent I&D on 1/4 and has planned I&D again today (1/6).  Blood and urine cultures negative thus far.   Day #4 of antibiotics  Plan:  Continue with piperacillin/tazobactam 3.375 g IV q 8 hours EI based on renal function and indication.  Pharmacy will continue to monitor, thank you for the consult.  Darylene Price Keara Pagliarulo 02/24/2015,11:19 AM

## 2015-02-24 NOTE — OR Nursing (Signed)
Dr. Adonis Huguenin placed a 32 french blake tube into the ostomy site and connected it to a foley bag.

## 2015-02-25 LAB — GLUCOSE, CAPILLARY
GLUCOSE-CAPILLARY: 143 mg/dL — AB (ref 65–99)
GLUCOSE-CAPILLARY: 138 mg/dL — AB (ref 65–99)
GLUCOSE-CAPILLARY: 153 mg/dL — AB (ref 65–99)
Glucose-Capillary: 171 mg/dL — ABNORMAL HIGH (ref 65–99)

## 2015-02-25 LAB — CBC
HEMATOCRIT: 23.7 % — AB (ref 35.0–47.0)
HEMOGLOBIN: 7.3 g/dL — AB (ref 12.0–16.0)
MCH: 25.5 pg — ABNORMAL LOW (ref 26.0–34.0)
MCHC: 30.7 g/dL — ABNORMAL LOW (ref 32.0–36.0)
MCV: 82.9 fL (ref 80.0–100.0)
Platelets: 302 10*3/uL (ref 150–440)
RBC: 2.86 MIL/uL — AB (ref 3.80–5.20)
RDW: 15.2 % — ABNORMAL HIGH (ref 11.5–14.5)
WBC: 7.9 10*3/uL (ref 3.6–11.0)

## 2015-02-25 NOTE — Progress Notes (Signed)
[name] at [place] NAME: [name]    MR#:  [id number]  DATE OF BIRTH:  Jun 20, 1950  SUBJECTIVE:  No acute issues. Patient may go back to the OR tomorrow for further debridement.  REVIEW OF SYSTEMS:    Review of Systems  Constitutional: Negative for fever and chills.  HENT: Negative for sore throat.   Eyes: Negative for blurred vision, double vision and pain.  Respiratory: Negative for cough, hemoptysis, shortness of breath and wheezing.   Cardiovascular: Negative for chest pain, palpitations, orthopnea and leg swelling.  Gastrointestinal: Positive for abdominal pain. Negative for heartburn, nausea, vomiting, diarrhea and constipation.  Genitourinary: Negative for dysuria and hematuria.  Musculoskeletal: Negative for back pain and joint pain.  Skin: Negative for rash.  Neurological: Negative for sensory change, speech change, focal weakness and headaches.  Endo/Heme/Allergies: Does not bruise/bleed easily.  Psychiatric/Behavioral: Negative for depression. The patient is not nervous/anxious.       DRUG ALLERGIES:   Allergies  Allergen Reactions  . Buspirone Other (See Comments)    Weakness  . Citalopram Other (See Comments)  . Lisinopril Cough  . Metformin Diarrhea    At 1000 mg dose  . Pravastatin Other (See Comments)    insomnia  . Sitagliptin Other (See Comments)    constipation  . Tramadol Itching  . Diltiazem Hcl Palpitations  . Gabapentin Palpitations  . Lovastatin Palpitations    VITALS:  Blood pressure 160/79, pulse 56, temperature 98.2 F (36.8 C), temperature source Oral, resp. rate 20, height 5\' 3"  (1.6 m), weight 108.863 kg (240 lb), SpO2 100 %.  PHYSICAL EXAMINATION:   Physical Exam  Constitutional: She is oriented to person, place, and time and well-developed, well-nourished, and in no distress. No distress.  HENT:  Head: Normocephalic.  Eyes: No scleral icterus.  Neck: Normal range of  motion. Neck supple. No JVD present. No tracheal deviation present.  Cardiovascular: Normal rate, regular rhythm and normal heart sounds.  Exam reveals no gallop and no friction rub.   No murmur heard. Pulmonary/Chest: Effort normal and breath sounds normal. No respiratory distress. She has no wheezes. She has no rales. She exhibits no tenderness.  Abdominal: Soft. Bowel sounds are normal. She exhibits no distension and no mass. There is no tenderness. There is no rebound and no guarding.  She has a large Bandage over her abdomen. She has an ostomy with stool.  Musculoskeletal: Normal range of motion. She exhibits no edema.  Neurological: She is alert and oriented to person, place, and time.  Skin: Skin is warm. No rash noted. No erythema.  Psychiatric: Affect and judgment normal.      LABORATORY PANEL:   CBC  Recent Labs Lab 02/25/15 0806  WBC 7.9  HGB 7.3*  HCT 23.7*  PLT 302   ------------------------------------------------------------------------------------------------------------------  Chemistries   Recent Labs Lab 02/22/15 0414 02/23/15 0403  NA 143 139  K 4.2 4.2  CL 108 106  CO2 26 25  GLUCOSE 73 142*  BUN 19 16  CREATININE 1.77* 1.91*  CALCIUM 6.5* 6.5*  MG 1.6*  --   AST 14*  --   ALT 13*  --   ALKPHOS 86  --   BILITOT 0.7  --    ------------------------------------------------------------------------------------------------------------------  Cardiac Enzymes No results for input(s): TROPONINI in the last 168 hours. ------------------------------------------------------------------------------------------------------------------  RADIOLOGY:  No results found.   ASSESSMENT AND PLAN:   IMPRESSION AND PLAN: Patient is a 65 year old African-American female admitted  with wound infection  1. Abdominal wall abscess with a complex wound to her left upper abdomen due to ostomy disruption: Plans as per surgery. Patient may have to go back to the OR  tomorrow for further debridement. Plan for wound VAC eventually.  2. Diabetes type 2: Patient has been started on a carb modified diet. Stop IV fluids and continue to monitor blood sugars. As per diabetes recommendations do not start Amaryl while she is inpatient. Continue sliding scale insulin.  3. Hypertension continue therapy with hydralazine when necessary, continue Avapro and metoprolol  4. Hypothyroidism continue with Synthroid  5. Hyperlipidemia continue atorvastatin  6. Acute on chronic Anemia : This is likely due to debridement and surgery. Patient is currently asymptomatic. Transfuse if < 7  7. Chronic renal failure stage [id number]   Management plans discussed with patient and she is in agreement DVT Prophylaxis: SCDs  TOTAL TIME TAKING CARE OF THIS PATIENT: 28 minutes.    Monai Hindes M.D on 02/25/2015 at 1:31 PM  Between 7am to 6pm - Pager - [phone]  After 6pm go to [email or web address] - password [email or web address] [facility]  Office  [phone]  CC: Primary care physician; Adin Hector, MD    Note: This dictation was prepared with Dragon dictation along with smaller phrase technology. Any transcriptional errors that result from this process are unintentional.

## 2015-02-25 NOTE — Progress Notes (Signed)
1 Day Post-Op   Subjective:  Patient did well overnight. She tolerated her debridement and dressing change in the operating room last difficult. She has tolerated her bedside dressing change today without difficulty. Drain and placed her ostomy has been productive of stool. No evidence of stool spillage as a dressing. Patient states that she is hungry and desires to have something more solid to eat.  Vital signs in last 24 hours: Temp:  [97.3 F (36.3 C)-98.4 F (36.9 C)] 98.2 F (36.8 C) (01/07 1010) Pulse Rate:  [56-77] 56 (01/07 1010) Resp:  [12-20] 20 (01/07 0439) BP: (144-203)/(66-93) 160/79 mmHg (01/07 1010) SpO2:  [98 %-100 %] 100 % (01/07 1010) Last BM Date:  (scant stool drainage from colostomy)  Intake/Output from previous day: 01/06 0701 - 01/07 0700 In: 3433.8 [P.O.:120; I.V.:3313.8] Out: 2010 [Urine:2000; Blood:10]  Physical exam: Gen.: No acute distress Chest: Clear to auscultation Heart: Regular rate and rhythm GI: Abdomen is very large, soft, nondistended. Large wound to the left upper quadrant around her ostomy with drain present with enterostomy. Wound bed appears to be beginning to granulate without any evidence of continued purulence.  Lab Results:  CBC  Recent Labs  02/23/15 0403 02/25/15 0806  WBC 9.8 7.9  HGB 7.7* 7.3*  HCT 24.2* 23.7*  PLT 342 302   CMP     Component Value Date/Time   NA 139 02/23/2015 0403   NA 143 07/19/2013 0523   K 4.2 02/23/2015 0403   K 3.7 07/19/2013 0523   CL 106 02/23/2015 0403   CL 109* 07/19/2013 0523   CO2 25 02/23/2015 0403   CO2 29 07/19/2013 0523   GLUCOSE 142* 02/23/2015 0403   GLUCOSE 110* 07/19/2013 0523   BUN 16 02/23/2015 0403   BUN 8 07/19/2013 0523   CREATININE 1.91* 02/23/2015 0403   CREATININE 1.58* 07/19/2013 0523   CALCIUM 6.5* 02/23/2015 0403   CALCIUM 7.8* 07/19/2013 0523   PROT 6.4* 02/22/2015 0414   PROT 5.4* 07/19/2013 0523   ALBUMIN 2.3* 02/22/2015 0414   ALBUMIN 2.1* 07/19/2013 0523    AST 14* 02/22/2015 0414   AST 16 07/19/2013 0523   ALT 13* 02/22/2015 0414   ALT 7* 07/19/2013 0523   ALKPHOS 86 02/22/2015 0414   ALKPHOS 66 07/19/2013 0523   BILITOT 0.7 02/22/2015 0414   BILITOT 0.3 07/19/2013 0523   GFRNONAA 27* 02/23/2015 0403   GFRNONAA 34* 07/19/2013 0523   GFRAA 31* 02/23/2015 0403   GFRAA 40* 07/19/2013 0523   PT/INR No results for input(s): LABPROT, INR in the last 72 hours.  Studies/Results: No results found.  Assessment/Plan: 65 year old female with a complex wound to her left upper abdomen secondary to ostomy disruption above the fascia. Plan for return to the operating room tomorrow for additional debridement if necessary. Will continue to take the operating room for intervention with hopes of eventually placing a wound VAC  Appreciate medical assistance with her complex, chronic medical problems.   Clayburn Pert, MD FACS General Surgeon  02/25/2015

## 2015-02-26 ENCOUNTER — Encounter: Payer: Self-pay | Admitting: Anesthesiology

## 2015-02-26 ENCOUNTER — Encounter
Admission: EM | Disposition: A | Discharge status: Home Health | Payer: Self-pay | Source: Home / Self Care | Attending: General Surgery

## 2015-02-26 ENCOUNTER — Inpatient Hospital Stay: Payer: PPO | Admitting: Anesthesiology

## 2015-02-26 HISTORY — PX: APPLICATION OF WOUND VAC: SHX5189

## 2015-02-26 HISTORY — PX: EXCISION MASS ABDOMINAL: SHX6701

## 2015-02-26 LAB — BASIC METABOLIC PANEL
Anion gap: 8 (ref 5–15)
BUN: 13 mg/dL (ref 6–20)
CALCIUM: 6.3 mg/dL — AB (ref 8.9–10.3)
CO2: 25 mmol/L (ref 22–32)
CREATININE: 2.21 mg/dL — AB (ref 0.44–1.00)
Chloride: 108 mmol/L (ref 101–111)
GFR calc Af Amer: 26 mL/min — ABNORMAL LOW (ref >60)
GFR, EST NON AFRICAN AMERICAN: 22 mL/min — AB (ref >60)
GLUCOSE: 118 mg/dL — AB (ref 65–99)
Potassium: 4.2 mmol/L (ref 3.5–5.1)
SODIUM: 141 mmol/L (ref 135–145)

## 2015-02-26 LAB — GLUCOSE, CAPILLARY
Glucose-Capillary: 91 mg/dL (ref 65–99)
Glucose-Capillary: 131 mg/dL — ABNORMAL HIGH (ref 65–99)
Glucose-Capillary: 169 mg/dL — ABNORMAL HIGH (ref 65–99)

## 2015-02-26 LAB — CULTURE, BLOOD (ROUTINE X 2)
CULTURE: NO GROWTH
CULTURE: NO GROWTH

## 2015-02-26 LAB — CBC
HCT: 24.8 % — ABNORMAL LOW (ref 35.0–47.0)
Hemoglobin: 7.8 g/dL — ABNORMAL LOW (ref 12.0–16.0)
MCH: 26.5 pg (ref 26.0–34.0)
MCHC: 31.4 g/dL — ABNORMAL LOW (ref 32.0–36.0)
MCV: 84.4 fL (ref 80.0–100.0)
PLATELETS: 316 10*3/uL (ref 150–440)
RBC: 2.94 MIL/uL — ABNORMAL LOW (ref 3.80–5.20)
RDW: 15.1 % — AB (ref 11.5–14.5)
WBC: 9.4 10*3/uL (ref 3.6–11.0)

## 2015-02-26 LAB — ALBUMIN: ALBUMIN: 2.1 g/dL — AB (ref 3.5–5.0)

## 2015-02-26 SURGERY — EXCISION, MASS, TORSO
Anesthesia: General

## 2015-02-26 MED ORDER — FENTANYL CITRATE (PF) 100 MCG/2ML IJ SOLN
INTRAMUSCULAR | Status: DC | PRN
  Administered 2015-02-26: 100 ug via INTRAVENOUS

## 2015-02-26 MED ORDER — BUPIVACAINE HCL (PF) 0.5 % IJ SOLN
INTRAMUSCULAR | Status: AC
  Filled 2015-02-26: qty 30

## 2015-02-26 MED ORDER — SUCCINYLCHOLINE CHLORIDE 20 MG/ML IJ SOLN
INTRAMUSCULAR | Status: DC | PRN
  Administered 2015-02-26: 100 mg via INTRAVENOUS

## 2015-02-26 MED ORDER — ONDANSETRON HCL 4 MG/2ML IJ SOLN
INTRAMUSCULAR | Status: DC | PRN
Start: 2015-02-26 — End: 2015-02-26
  Administered 2015-02-26: 4 mg via INTRAVENOUS

## 2015-02-26 MED ORDER — EPHEDRINE SULFATE 50 MG/ML IJ SOLN
INTRAMUSCULAR | Status: DC | PRN
  Administered 2015-02-26 (×2): 10 mg via INTRAVENOUS

## 2015-02-26 MED ORDER — LIDOCAINE HCL (CARDIAC) 20 MG/ML IV SOLN
INTRAVENOUS | Status: DC | PRN
  Administered 2015-02-26: 100 mg via INTRAVENOUS

## 2015-02-26 MED ORDER — PROPOFOL 10 MG/ML IV BOLUS
INTRAVENOUS | Status: DC | PRN
  Administered 2015-02-26: 140 mg via INTRAVENOUS

## 2015-02-26 MED ORDER — FENTANYL CITRATE (PF) 100 MCG/2ML IJ SOLN: 25.0000 ug | INTRAMUSCULAR | Status: DC | PRN

## 2015-02-26 MED ORDER — PROMETHAZINE HCL 25 MG/ML IJ SOLN: 6.2500 mg | INTRAMUSCULAR | Status: DC | PRN

## 2015-02-26 MED ORDER — LACTATED RINGERS IV SOLN
INTRAVENOUS | Status: DC | PRN
  Administered 2015-02-26: 09:00:00 via INTRAVENOUS

## 2015-02-26 MED ORDER — LIDOCAINE HCL (PF) 1 % IJ SOLN
INTRAMUSCULAR | Status: AC
  Filled 2015-02-26: qty 30

## 2015-02-26 SURGICAL SUPPLY — 18 items
BLADE SURG 15 STRL LF DISP TIS (BLADE) ×1 IMPLANT
BLADE SURG 15 STRL SS (BLADE) ×2
CANISTER SUCT 1200ML W/VALVE (MISCELLANEOUS) ×3 IMPLANT
CHLORAPREP W/TINT 26ML (MISCELLANEOUS) ×3 IMPLANT
DRAPE LAPAROTOMY T 102X78X121 (DRAPES) ×3 IMPLANT
GLOVE BIO SURGEON STRL SZ7.5 (GLOVE) ×3 IMPLANT
GLOVE INDICATOR 8.0 STRL GRN (GLOVE) ×3 IMPLANT
GOWN STRL REUS W/ TWL LRG LVL3 (GOWN DISPOSABLE) ×2 IMPLANT
GOWN STRL REUS W/TWL LRG LVL3 (GOWN DISPOSABLE) ×4
KIT RM TURNOVER STRD PROC AR (KITS) ×3 IMPLANT
LIQUID BAND (GAUZE/BANDAGES/DRESSINGS) ×3 IMPLANT
NDL SAFETY 22GX1.5 (NEEDLE) ×3 IMPLANT
PACK BASIN MINOR ARMC (MISCELLANEOUS) ×3 IMPLANT
PAD GROUND ADULT SPLIT (MISCELLANEOUS) ×3 IMPLANT
SUT MNCRL AB 4-0 PS2 18 (SUTURE) ×3 IMPLANT
SUT VIC AB 2-0 SH 27 (SUTURE) ×2
SUT VIC AB 2-0 SH 27XBRD (SUTURE) ×1 IMPLANT
SYRINGE 10CC LL (SYRINGE) ×3 IMPLANT

## 2015-02-26 NOTE — Progress Notes (Signed)
Patient was in 0R this morning when I went to see patient. I will see patient tomorrow. No new recommendations.

## 2015-02-26 NOTE — Brief Op Note (Signed)
02/21/2015 - 02/26/2015  9:22 AM  PATIENT:  [name]  65 y.o. female  PRE-OPERATIVE DIAGNOSIS:  abdominal mass  POST-OPERATIVE DIAGNOSIS:  same  PROCEDURE:  Procedure(s): EXCISION MASS ABDOMINAL/wash out (N/A) APPLICATION OF WOUND VAC (N/A)  SURGEON:  Surgeon(s) and Role:    * Clayburn Pert, MD - Primary  PHYSICIAN ASSISTANT:   ASSISTANTS: none   ANESTHESIA:   general  EBL:  Total I/O In: 200 [I.V.:200] Out: -   BLOOD ADMINISTERED:none  DRAINS: Penrose drain in the lateral aspect of the abdominal wall abscess   LOCAL MEDICATIONS USED:  NONE  SPECIMEN:  No Specimen  DISPOSITION OF SPECIMEN:  N/A  COUNTS:  YES  TOURNIQUET:  * No tourniquets in log *  DICTATION: .Dragon Dictation  PLAN OF CARE: return to inpatient  PATIENT DISPOSITION:  PACU - hemodynamically stable.   Delay start of Pharmacological VTE agent (>24hrs) due to surgical blood loss or risk of bleeding: no

## 2015-02-26 NOTE — Progress Notes (Signed)
Day of Surgery   Subjective:  Patient tolerated procedure well today. Only complaint is of hunger, tolerating wound care with ease.  Vital signs in last 24 hours: Temp:  [97.4 F (36.3 C)-98.6 F (37 C)] 98 F (36.7 C) (01/08 1226) Pulse Rate:  [57-64] 62 (01/08 1226) Resp:  [1-20] 20 (01/08 1226) BP: (130-179)/(60-87) 179/84 mmHg (01/08 1226) SpO2:  [94 %-100 %] 100 % (01/08 1226) Last BM Date: 02/26/15 (via drainage tube)  Intake/Output from previous day: 01/07 0701 - 01/08 0700 In: 240 [P.O.:240] Out: 1500 [Urine:1500]  GI: Large abdominal wound to left upper quadrant with good granulation tissue and now with penrose drain in lateral aspect  Lab Results:  CBC  Recent Labs  02/25/15 0806 02/26/15 0539  WBC 7.9 9.4  HGB 7.3* 7.8*  HCT 23.7* 24.8*  PLT 302 316   CMP     Component Value Date/Time   NA 141 02/26/2015 0539   NA 143 07/19/2013 0523   K 4.2 02/26/2015 0539   K 3.7 07/19/2013 0523   CL 108 02/26/2015 0539   CL 109* 07/19/2013 0523   CO2 25 02/26/2015 0539   CO2 29 07/19/2013 0523   GLUCOSE 118* 02/26/2015 0539   GLUCOSE 110* 07/19/2013 0523   BUN 13 02/26/2015 0539   BUN 8 07/19/2013 0523   CREATININE 2.21* 02/26/2015 0539   CREATININE 1.58* 07/19/2013 0523   CALCIUM 6.3* 02/26/2015 0539   CALCIUM 7.8* 07/19/2013 0523   PROT 6.4* 02/22/2015 0414   PROT 5.4* 07/19/2013 0523   ALBUMIN 2.1* 02/26/2015 0539   ALBUMIN 2.1* 07/19/2013 0523   AST 14* 02/22/2015 0414   AST 16 07/19/2013 0523   ALT 13* 02/22/2015 0414   ALT 7* 07/19/2013 0523   ALKPHOS 86 02/22/2015 0414   ALKPHOS 66 07/19/2013 0523   BILITOT 0.7 02/22/2015 0414   BILITOT 0.3 07/19/2013 0523   GFRNONAA 22* 02/26/2015 0539   GFRNONAA 34* 07/19/2013 0523   GFRAA 26* 02/26/2015 0539   GFRAA 40* 07/19/2013 0523   PT/INR No results for input(s): LABPROT, INR in the last 72 hours.  Studies/Results: No results found.  Assessment/Plan: 65 year old female who is 3 weeks s/p  parastomal hernia repair by Dr. Marina Gravel who had an event last week that caused the top of her ostomy to necrose and stool to spill into her subcutaneous tissues creating the large wound that she currently has. The majority of her fecal stream is being diverted into a 35fr blake drain and the wound is now being cared for with TID wet to dry dressing changes. Will need aggressive wound care until area granulates in. Wound will likely not fully heal until she can tolerate an ostomy reversal.   Clayburn Pert, MD FACS General Surgeon  02/26/2015

## 2015-02-26 NOTE — Anesthesia Procedure Notes (Signed)
Procedure Name: Intubation Date/Time: 02/26/2015 8:57 AM Performed by: Martha Clan Pre-anesthesia Checklist: Patient identified, Emergency Drugs available, Suction available, Patient being monitored and Timeout performed Patient Re-evaluated:Patient Re-evaluated prior to inductionOxygen Delivery Method: Circle system utilized Preoxygenation: Pre-oxygenation with 100% oxygen Intubation Type: IV induction Laryngoscope Size: Mac and 3 Grade View: Grade III Tube type: Oral Tube size: 7.0 mm Number of attempts: 1 Placement Confirmation: ETT inserted through vocal cords under direct vision,  positive ETCO2 and breath sounds checked- equal and bilateral Secured at: 22 cm Tube secured with: Tape

## 2015-02-26 NOTE — Op Note (Signed)
Pre-operative Diagnosis: abdominal wall abscess  Post-operative Diagnosis:Large abdominal wound to the left upper quadrant around her ostomy  Surgeon: Clayburn Pert   Assistants: None  Anesthesia: General endotracheal anesthesia  ASA Class: 3  Surgeon: Clayburn Pert, MD FACS  Anesthesia: Gen. with endotracheal tube  Assistant: none  Procedure Details  The patient was seen again in the Holding Room. The benefits, complications, treatment options, and expected outcomes were discussed with the patient. The risks of bleeding, infection, recurrence of symptoms, failure to resolve symptoms, bowel injury, any of which could require further surgery were reviewed with the patient. The patient was taken to Operating Room, identified as [name] and the procedure verified. A Time Out was held and the above information confirmed.  Prior to the induction of general anesthesia, antibiotic prophylaxis was administered. VTE prophylaxis was in place. General endotracheal anesthesia was then administered and tolerated well. After the induction, the abdomen was prepped with Betadine and draped in the sterile fashion. The patient was positioned in the supine position.  The previously placed drain remained in place and was successfully diverting the majority of stool away from her wound. There was no additional necrotic tissue noted on inspection. The ostomy at the fascia was again noted to be pink, viable, healthy. There was some stool spillage into the tissues immediately around the ostomy that was easily removed with saline irrigation. The fascia was again noted to be intact throughout the entirety of the wound. The entire wound was copiously irrigated and any nonviable tissue was bluntly removed.   At this point the decision was made to reapproximate the lateral tissue and continue wet-to-dry dressings to the medial tissues. A large Penrose drain was placed into the lateral tissue. The  tissue is reapproximated with an interrupted vertical mattress 0 Prolene suture. A Kerlix gauze was soaked in saline and place tightly into the cavity around the McBride drain. ABG pads were placed over this and secured with tape. The Blake drain was placed to a Foley bag for continued fecal control.   Findings: Additional necrotic superficial fat associated with large abdominal wall abscess   Estimated Blood Loss: 5 mL's   Drains: Penrose drain into the lateral abscess cavity   Specimens: none    Complications: none      Condition: good   Clayburn Pert, MD, FACS

## 2015-02-26 NOTE — Progress Notes (Signed)
ANTIBIOTIC CONSULT NOTE - FOLLOW UP  Pharmacy Consult for piperacillin/tazobactam Indication: Infected Surgical Wound  Allergies  Allergen Reactions  . Buspirone Other (See Comments)    Weakness  . Citalopram Other (See Comments)  . Lisinopril Cough  . Metformin Diarrhea    At 1000 mg dose  . Pravastatin Other (See Comments)    insomnia  . Sitagliptin Other (See Comments)    constipation  . Tramadol Itching  . Diltiazem Hcl Palpitations  . Gabapentin Palpitations  . Lovastatin Palpitations   Patient Measurements: Height: 5\' 3"  (160 cm) Weight: 240 lb (108.863 kg) IBW/kg (Calculated) : 52.4  Vital Signs: Temp: 98.2 F (36.8 C) (01/08 0422) Temp Source: Oral (01/08 0422) BP: 158/78 mmHg (01/08 0422) Pulse Rate: 60 (01/08 0422)  Labs:  Recent Labs  02/25/15 0806 02/26/15 0539  WBC 7.9 9.4  HGB 7.3* 7.8*  PLT 302 316  CREATININE  --  2.21*   Estimated Creatinine Clearance: 30 mL/min (by C-G formula based on Cr of 2.21).  Assessment: Pharmacy dosing piperacillin/tazobactam in this 65 year old female for infected would/abscess around ostomy site. Patient underwent I&D on 1/4 and has I&D again  On 1/6.  Blood and urine cultures negative thus far.   Day #6 of antibiotics  Plan:  Continue with piperacillin/tazobactam 3.375 g IV q 8 hours EI based on renal function and indication.  Pharmacy will continue to monitor per consult.  Nancy Fetter, PharmD Pharmacy Resident 02/26/2015,8:39 AM

## 2015-02-26 NOTE — Anesthesia Preprocedure Evaluation (Signed)
Anesthesia Evaluation  Patient identified by MRN, date of birth, ID band Patient awake    Reviewed: Allergy & Precautions, H&P , NPO status , Patient's Chart, lab work & pertinent test results  History of Anesthesia Complications Negative for: history of anesthetic complications  Airway Mallampati: III  TM Distance: >3 FB Neck ROM: limited    Dental no notable dental hx. (+) Poor Dentition, Chipped   Pulmonary shortness of breath, former smoker,    Pulmonary exam normal breath sounds clear to auscultation       Cardiovascular Exercise Tolerance: Poor hypertension, (-) angina+ PND  (-) Past MI Normal cardiovascular exam Rhythm:regular Rate:Normal     Neuro/Psych PSYCHIATRIC DISORDERS Anxiety negative neurological ROS     GI/Hepatic negative GI ROS, Neg liver ROS,   Endo/Other  diabetes, Poorly Controlled, Type 2Hyperthyroidism Morbid obesity  Renal/GU Renal disease  negative genitourinary   Musculoskeletal  (+) Arthritis ,   Abdominal   Peds  Hematology negative hematology ROS (+)   Anesthesia Other Findings Past Medical History:   Diabetes mellitus without complication (HCC)                 GI bleed                                                     Aortic valve stenosis                                        Hyperlipidemia                                               Hypertension                                                 Diverticulitis large intestine                               Obesity                                                      Shortness of breath dyspnea                                  Hyperthyroidism                                              Anxiety  Arthritis                                                    Neuropathy in diabetes ([place])                                 Anemia                                                      Vertigo                                                      Bowel obstruction [facility])                                     Past Surgical History:   COLECTOMY                                                     laparotomy closure of cecal perforation          05/09/2013     Comment:Dr. Marina Gravel   TONSILLECTOMY                                                 BACK SURGERY                                                    Comment:spur frmoved from lower back   CARDIAC CATHETERIZATION                                       ABDOMINAL HYSTERECTOMY                                        LAPAROTOMY                                      N/A 02/06/2015     Comment:Procedure: Laparotomy, reduction of               incarcerated parastomal hernia, repair of               parastomal hernia with mesh;  Surgeon: Elta Guadeloupe  Marina Gravel, MD;  Location: [facility] ORS;  Service:               General;  Laterality: N/A;   DEBRIDEMENT OF ABDOMINAL WALL ABSCESS           N/A 02/22/2015       Comment:Procedure: DEBRIDEMENT OF ABDOMINAL WALL               ABSCESS;  Surgeon: Clayburn Pert, MD;                Location: [facility] ORS;  Service: General;                Laterality: N/A;  BMI    Body Mass Index   42.52 kg/m 2      Reproductive/Obstetrics negative OB ROS                             Anesthesia Physical  Anesthesia Plan  ASA: III  Anesthesia Plan: General ETT   Post-op Pain Management:    Induction:   Airway Management Planned:   Additional Equipment:   Intra-op Plan:   Post-operative Plan:   Informed Consent: I have reviewed the patients History and Physical, chart, labs and discussed the procedure including the risks, benefits and alternatives for the proposed anesthesia with the patient or authorized representative who has indicated his/her understanding and acceptance.   Dental Advisory Given  Plan Discussed with: Anesthesiologist, CRNA and Surgeon  Anesthesia Plan  Comments:         Anesthesia Quick Evaluation

## 2015-02-26 NOTE — Anesthesia Postprocedure Evaluation (Signed)
Anesthesia Post Note  Patient: [name]  Procedure(s) Performed: Procedure(s) (LRB): EXCISION MASS ABDOMINAL/wash out (N/A) APPLICATION OF WOUND VAC (N/A)  Patient location during evaluation: PACU Anesthesia Type: General Level of consciousness: awake and alert Pain management: pain level controlled Vital Signs Assessment: post-procedure vital signs reviewed and stable Respiratory status: spontaneous breathing, nonlabored ventilation, respiratory function stable and patient connected to nasal cannula oxygen Cardiovascular status: blood pressure returned to baseline and stable Postop Assessment: no signs of nausea or vomiting Anesthetic complications: no    Last Vitals:  Filed Vitals:   02/26/15 1044 02/26/15 1120  BP: 161/87 174/77  Pulse: 57 57  Temp: 36.6 C 36.6 C  Resp: 16 16    Last Pain:  Filed Vitals:   02/26/15 1120  PainSc: 0-No pain                 Martha Clan

## 2015-02-26 NOTE — Transfer of Care (Signed)
Immediate Anesthesia Transfer of Care Note  Patient: [name]  Procedure(s) Performed: Procedure(s): EXCISION MASS ABDOMINAL/wash out (N/A) APPLICATION OF WOUND VAC (N/A)  Patient Location: PACU  Anesthesia Type:General  Level of Consciousness: awake  Airway & Oxygen Therapy: Patient Spontanous Breathing and Patient connected to face mask oxygen  Post-op Assessment: Report given to RN and Post -op Vital signs reviewed and stable  Post vital signs: Reviewed and stable  Last Vitals:  Filed Vitals:   02/26/15 0422 02/26/15 0933  BP: 158/78 151/77  Pulse: 60 60  Temp: 36.8 C 36.3 C  Resp: 18 16    Complications: No apparent anesthesia complications

## 2015-02-26 NOTE — Progress Notes (Signed)
Called Dr. [name] regarding patient's critical calcium value of 6.3 per lab.  Doctor continuing to monitor patient.  Christene Slates   02/26/2015   6:39 AM

## 2015-02-27 ENCOUNTER — Encounter: Payer: Self-pay | Admitting: General Surgery

## 2015-02-27 LAB — GLUCOSE, CAPILLARY
GLUCOSE-CAPILLARY: 88 mg/dL (ref 65–99)
GLUCOSE-CAPILLARY: 137 mg/dL — AB (ref 65–99)
Glucose-Capillary: 155 mg/dL — ABNORMAL HIGH (ref 65–99)
Glucose-Capillary: 137 mg/dL — ABNORMAL HIGH (ref 65–99)

## 2015-02-27 MED ORDER — PANTOPRAZOLE SODIUM 40 MG PO TBEC
40.0000 mg | DELAYED_RELEASE_TABLET | Freq: Every day | ORAL | Status: DC
  Administered 2015-02-27 – 2015-03-12 (×15): 40 mg via ORAL
  Filled 2015-02-27 (×15): qty 1

## 2015-02-27 NOTE — Progress Notes (Signed)
[place] [facility] [place] NAME: [name]    MR#:  [id number]  DATE OF BIRTH:  Oct 02, 1950  SUBJECTIVE:  Patient is doing well this morning. Patient went to the operating room yesterday and the lateral portion of her wound was closed over a Penrose.Marland Kitchen  REVIEW OF SYSTEMS:    Review of Systems  Constitutional: Negative for fever and chills.  HENT: Negative for sore throat.   Eyes: Negative for blurred vision, double vision and pain.  Respiratory: Negative for cough, hemoptysis, shortness of breath and wheezing.   Cardiovascular: Negative for chest pain, palpitations, orthopnea and leg swelling.  Gastrointestinal: Negative for heartburn, nausea, vomiting, abdominal pain, diarrhea and constipation.  Genitourinary: Negative for dysuria and hematuria.  Musculoskeletal: Negative for back pain and joint pain.  Skin: Negative for rash.  Neurological: Negative for sensory change, speech change, focal weakness and headaches.  Endo/Heme/Allergies: Does not bruise/bleed easily.  Psychiatric/Behavioral: Negative for depression. The patient is not nervous/anxious.       DRUG ALLERGIES:   Allergies  Allergen Reactions  . Buspirone Other (See Comments)    Weakness  . Citalopram Other (See Comments)  . Lisinopril Cough  . Metformin Diarrhea    At 1000 mg dose  . Pravastatin Other (See Comments)    insomnia  . Sitagliptin Other (See Comments)    constipation  . Tramadol Itching  . Diltiazem Hcl Palpitations  . Gabapentin Palpitations  . Lovastatin Palpitations    VITALS:  Blood pressure 159/73, pulse 64, temperature 97.7 F (36.5 C), temperature source Oral, resp. rate 18, height 5\' 3"  (1.6 m), weight 108.863 kg (240 lb), SpO2 95 %.  PHYSICAL EXAMINATION:   Physical Exam  Constitutional: She is oriented to person, place, and time and well-developed, well-nourished, and in no distress. No distress.  HENT:  Head: Normocephalic.  Eyes:  No scleral icterus.  Neck: Normal range of motion. Neck supple. No JVD present. No tracheal deviation present.  Cardiovascular: Normal rate and regular rhythm.  Exam reveals no gallop and no friction rub.   Murmur heard. Pulmonary/Chest: Effort normal and breath sounds normal. No respiratory distress. She has no wheezes. She has no rales. She exhibits no tenderness.  Abdominal: Soft. Bowel sounds are normal. She exhibits no distension and no mass. There is no tenderness. There is no rebound and no guarding.  She has a large Bandage over her abdomen. She has an ostomy with stool. Her staples are clean and intact.  Musculoskeletal: Normal range of motion. She exhibits no edema.  Neurological: She is alert and oriented to person, place, and time.  Skin: Skin is warm. No rash noted. No erythema.  Psychiatric: Affect and judgment normal.      LABORATORY PANEL:   CBC  Recent Labs Lab 02/26/15 0539  WBC 9.4  HGB 7.8*  HCT 24.8*  PLT 316   ------------------------------------------------------------------------------------------------------------------  Chemistries   Recent Labs Lab 02/22/15 0414  02/26/15 0539  NA 143  < > 141  K 4.2  < > 4.2  CL 108  < > 108  CO2 26  < > 25  GLUCOSE 73  < > 118*  BUN 19  < > 13  CREATININE 1.77*  < > 2.21*  CALCIUM 6.5*  < > 6.3*  MG 1.6*  --   --   AST 14*  --   --   ALT 13*  --   --   ALKPHOS 86  --   --  BILITOT 0.7  --   --   < > = values in this interval not displayed. ------------------------------------------------------------------------------------------------------------------  Cardiac Enzymes No results for input(s): TROPONINI in the last 168 hours. ------------------------------------------------------------------------------------------------------------------  RADIOLOGY:  No results found.   ASSESSMENT AND PLAN:   IMPRESSION AND PLAN: Patient is a 65 year old African-American female admitted with wound  infection  1. Abdominal wall abscess with a complex wound to her left upper abdomen due to ostomy disruption: Plans as per surgery. Plan for wound VAC eventually as per surgery.  2. Diabetes type 2: Patient has been started on a carb modified diet. As per diabetes recommendations do not start Amaryl while she is inpatient. Continue sliding scale insulin.  3. Hypertension continue therapy with hydralazine when necessary, continue Avapro and metoprolol  4. Hypothyroidism continue with Synthroid  5. Hyperlipidemia continue atorvastatin  6. Acute on chronic Anemia : This is likely due to debridement and surgery. Patient is currently asymptomatic. Transfuse if < 7  7. Chronic renal failure stage [id number] 8. Atrial fibrillation/SVT: Patient is on amiodarone and [place]. She has been seen by [facility] cardiology in the past.  Management plans discussed with patient and she is in agreement DVT Prophylaxis: SCDs  TOTAL TIME TAKING CARE OF THIS PATIENT: 25 minutes.    [name] M.D on 02/27/2015 at 12:03 PM  Between 7am to 6pm - Pager - [phone]  After 6pm go to [email or web address] - password [email or web address] [place] [facility]  Office  [phone]  CC: Primary care physician; Adin Hector, MD    Note: This dictation was prepared with Dragon dictation along with smaller phrase technology. Any transcriptional errors that result from this process are unintentional.

## 2015-02-27 NOTE — Progress Notes (Signed)
Key Points: Use following P&T approved IV to PO antibiotic change policy.  Description contains the criteria that are approved Note: Policy Excludes:  Esophagectomy patientsPHARMACIST - PHYSICIAN COMMUNICATION DR:   Benjie Karvonen CONCERNING: IV to Oral Route Change Policy  RECOMMENDATION: This patient is receiving pantopraozle by the intravenous route.  Based on criteria approved by the Pharmacy and Therapeutics Committee, the intravenous medication(s) is/are being converted to the equivalent oral dose form(s).   DESCRIPTION: These criteria include:  The patient is eating (either orally or via tube) and/or has been taking other orally administered medications for a least 24 hours  The patient has no evidence of active gastrointestinal bleeding or impaired GI absorption (gastrectomy, short bowel, patient on TNA or NPO).  If you have questions about this conversion, please contact the Pharmacy Department  []   [phone] )  [facility] [x]   [phone] )  [facility] []   [phone] )  [facility] []   [phone] )  [facility] []   [phone] )  [place], [facility] 02/27/2015 10:17 AM

## 2015-02-27 NOTE — Progress Notes (Signed)
1 Day Post-Op  Subjective: Patient was met in the preop area yesterday and then was present in the operating room for examination under anesthesia. Dr. Adonis Huguenin close lateral portion of the wound over a Penrose yesterday. Today the patient has no complaints.  Objective: Vital signs in last 24 hours: Temp:  [97.4 F (36.3 C)-98.3 F (36.8 C)] 97.7 F (36.5 C) (01/09 0528) Pulse Rate:  [57-64] 58 (01/09 0528) Resp:  [16-20] 18 (01/09 0528) BP: (151-179)/(72-87) 157/75 mmHg (01/09 0528) SpO2:  [94 %-100 %] 95 % (01/09 0528) Last BM Date: 02/26/15  Intake/Output from previous day: 01/08 0701 - 01/09 0700 In: 73 [P.O.:240; I.V.:250] Out: 1200 [Urine:1200] Intake/Output this shift: Total I/O In: 0  Out: 500 [Urine:500]  Physical exam:  Physical exam demonstrates an obese female in no acute distress with granulating wound Penrose lateral drainage tube is in the ostomy functional.  Lab Results: CBC   Recent Labs  02/25/15 0806 02/26/15 0539  WBC 7.9 9.4  HGB 7.3* 7.8*  HCT 23.7* 24.8*  PLT 302 316   BMET  Recent Labs  02/26/15 0539  NA 141  K 4.2  CL 108  CO2 25  GLUCOSE 118*  BUN 13  CREATININE 2.21*  CALCIUM 6.3*   PT/INR No results for input(s): LABPROT, INR in the last 72 hours. ABG No results for input(s): PHART, HCO3 in the last 72 hours.  Invalid input(s): PCO2, PO2  Studies/Results: No results found.  Anti-infectives: Anti-infectives    Start     Dose/Rate Route Frequency Ordered Stop   02/21/15 1900  piperacillin-tazobactam (ZOSYN) IVPB 3.375 g  Status:  Discontinued     3.375 g 12.5 mL/hr over 240 Minutes Intravenous 3 times per day 02/21/15 1453 02/21/15 1504   02/21/15 1700  piperacillin-tazobactam (ZOSYN) IVPB 3.375 g     3.375 g 12.5 mL/hr over 240 Minutes Intravenous Every 8 hours 02/21/15 1504     02/21/15 1045  vancomycin (VANCOCIN) 1,500 mg in sodium chloride 0.9 % 500 mL IVPB     1,500 mg 250 mL/hr over 120 Minutes Intravenous   Once 02/21/15 1021     02/21/15 1030  piperacillin-tazobactam (ZOSYN) IVPB 3.375 g     3.375 g 12.5 mL/hr over 240 Minutes Intravenous  Once 02/21/15 1021 02/21/15 1151      Assessment/Plan: s/p Procedure(s): EXCISION MASS ABDOMINAL/wash out APPLICATION OF WOUND VAC   Patient doing very well at this point I think that this is healing quite well and does not need quite is frequent debridement but I will reassess daily.Florene Glen, MD, FACS  02/27/2015

## 2015-02-28 LAB — BASIC METABOLIC PANEL
ANION GAP: 8 (ref 5–15)
BUN: 15 mg/dL (ref 6–20)
CHLORIDE: 106 mmol/L (ref 101–111)
CO2: 26 mmol/L (ref 22–32)
Calcium: 6.3 mg/dL — CL (ref 8.9–10.3)
Creatinine, Ser: 2.08 mg/dL — ABNORMAL HIGH (ref 0.44–1.00)
GFR calc Af Amer: 28 mL/min — ABNORMAL LOW (ref >60)
GFR calc non Af Amer: 24 mL/min — ABNORMAL LOW (ref >60)
GLUCOSE: 119 mg/dL — AB (ref 65–99)
POTASSIUM: 4.1 mmol/L (ref 3.5–5.1)
Sodium: 140 mmol/L (ref 135–145)

## 2015-02-28 LAB — GLUCOSE, CAPILLARY
GLUCOSE-CAPILLARY: 126 mg/dL — AB (ref 65–99)
GLUCOSE-CAPILLARY: 176 mg/dL — AB (ref 65–99)
GLUCOSE-CAPILLARY: 132 mg/dL — AB (ref 65–99)
Glucose-Capillary: 86 mg/dL (ref 65–99)

## 2015-02-28 LAB — CBC
HEMATOCRIT: 25.9 % — AB (ref 35.0–47.0)
HEMOGLOBIN: 8.2 g/dL — AB (ref 12.0–16.0)
MCH: 26.3 pg (ref 26.0–34.0)
MCHC: 31.4 g/dL — ABNORMAL LOW (ref 32.0–36.0)
MCV: 83.6 fL (ref 80.0–100.0)
Platelets: 299 10*3/uL (ref 150–440)
RBC: 3.1 MIL/uL — ABNORMAL LOW (ref 3.80–5.20)
RDW: 15.6 % — AB (ref 11.5–14.5)
WBC: 9.7 10*3/uL (ref 3.6–11.0)

## 2015-02-28 MED ORDER — HYDRALAZINE HCL 25 MG PO TABS
25.0000 mg | ORAL_TABLET | Freq: Three times a day (TID) | ORAL | Status: DC
  Administered 2015-02-28 – 2015-03-02 (×6): 25 mg via ORAL
  Filled 2015-02-28 (×6): qty 1

## 2015-02-28 NOTE — NC FL2 (Signed)
[place] LEVEL OF CARE SCREENING TOOL     IDENTIFICATION  Patient Name: [name] Birthdate: 12-18-1950 Sex: female Admission Date (Current Location): 02/21/2015  [place] and [place] Number:  Engineering geologist and Address:  [facility], [address] [place], [place] [address]      Provider Number: [id number]  Attending Physician Name and Address:  Clayburn Pert, MD  Relative Name and Phone Number:       Current Level of Care: Hospital Recommended Level of Care: [place] Prior Approval Number:    Date Approved/Denied:   PASRR Number:  ([id number])  Discharge Plan: SNF    Current Diagnoses: Patient Active Problem List   Diagnosis Date Noted  . Wound, surgical, infected 02/21/2015  . Absolute anemia 02/16/2015  . Aortic heart valve narrowing 02/16/2015  . Arthritis 02/16/2015  . Chronic kidney disease (CKD), stage III (moderate) 02/16/2015  . Diabetic neuropathy ([place]) 02/16/2015  . Diverticulitis 02/16/2015  . Focal and segmental hyalinosis 02/16/2015  . Bleeding gastrointestinal 02/16/2015  . Acquired atrophy of thyroid 02/16/2015  . Psoriasis 02/16/2015  . Small bowel obstruction ([place]) 02/04/2015  . Incarcerated hernia   . Paroxysmal supraventricular tachycardia ([place]) 10/11/2014  . Breathlessness on exertion 10/11/2014  . Benign essential HTN 10/04/2014  . Diverticulitis of colon 09/05/2014  . Obesity 09/05/2014  . B12 deficiency 03/02/2014  . Type 2 diabetes mellitus ([place]) 12/01/2013  . Open wnd anterior abdomen 10/15/2013  . Abnormal presence of protein in urine 10/15/2010    Orientation RESPIRATION BLADDER Height & Weight    Self, Time, Situation, Place  Normal Continent 5\' 3"  (160 cm) 240 lbs.  BEHAVIORAL SYMPTOMS/MOOD NEUROLOGICAL BOWEL NUTRITION STATUS   (None)  (None) Incontinent (Colostomy) Diet (Regular )  AMBULATORY STATUS COMMUNICATION OF NEEDS Skin   Extensive Assist  Verbally Other (Comment) (Open Drain abdomen )                       Personal Care Assistance Level of Assistance  Bathing, Feeding, Dressing Bathing Assistance: Limited assistance Feeding assistance: Independent Dressing Assistance: Limited assistance     Functional Limitations Info  Sight, Hearing, Speech Sight Info: Adequate Hearing Info: Adequate Speech Info: Adequate    SPECIAL CARE FACTORS FREQUENCY  PT (By licensed PT)     PT Frequency:  (5)              Contractures      Additional Factors Info  Code Status, Isolation Precautions Code Status Info:  (Full Code )     Insulin Sliding Scale Info:  (insulin aspart (novoLOG) injection 0-9 Units- 3 times daily with meals ) Isolation Precautions Info:  (Contact precautions: MRSA)     Current Medications (02/28/2015):  This is the current hospital active medication list Current Facility-Administered Medications  Medication Dose Route Frequency Provider Last Rate Last Dose  . acetaminophen (TYLENOL) tablet 650 mg  650 mg Oral Q6H PRN Srikar Sudini, MD      . amiodarone (PACERONE) tablet 200 mg  200 mg Oral Daily Clayburn Pert, MD   200 mg at 02/28/15 1007  . atorvastatin (LIPITOR) tablet 40 mg  40 mg Oral QHS Clayburn Pert, MD   40 mg at 02/27/15 2151  . digoxin (LANOXIN) tablet 0.0625 mg  0.0625 mg Oral Daily Clayburn Pert, MD   0.0625 mg at 02/28/15 1008  . diphenhydrAMINE (BENADRYL) 12.5 MG/5ML elixir 12.5 mg  12.5 mg Oral Q6H PRN Clayburn Pert,  MD       Or  . diphenhydrAMINE (BENADRYL) injection 12.5 mg  12.5 mg Intravenous Q6H PRN Clayburn Pert, MD      . DULoxetine (CYMBALTA) DR capsule 30 mg  30 mg Oral Daily Clayburn Pert, MD   30 mg at 02/28/15 1007  . hydrALAZINE (APRESOLINE) injection 10 mg  10 mg Intravenous Q2H PRN Clayburn Pert, MD      . hydrALAZINE (APRESOLINE) tablet 25 mg  25 mg Oral 3 times per day Bettey Costa, MD   25 mg at 02/28/15 1350  . HYDROmorphone (DILAUDID) injection 1 mg   1 mg Intravenous Q4H PRN Clayburn Pert, MD   1 mg at 02/28/15 1239  . insulin aspart (novoLOG) injection 0-9 Units  0-9 Units Subcutaneous TID WC Dustin Flock, MD   1 Units at 02/28/15 1239  . irbesartan (AVAPRO) tablet 300 mg  300 mg Oral Daily Clayburn Pert, MD   300 mg at 02/28/15 1006  . levothyroxine (SYNTHROID, LEVOTHROID) tablet 88 mcg  88 mcg Oral Rubin Payor, MD   88 mcg at 02/28/15 [phone]  . metoprolol succinate (TOPROL-XL) 24 hr tablet 50 mg  50 mg Oral BID Hillary Bow, MD   50 mg at 02/28/15 1007  . ondansetron (ZOFRAN-ODT) disintegrating tablet 4 mg  4 mg Oral Q6H PRN Clayburn Pert, MD       Or  . ondansetron [facility]) injection 4 mg  4 mg Intravenous Q6H PRN Clayburn Pert, MD   4 mg at 02/24/15 1310  . oxyCODONE-acetaminophen (PERCOCET/ROXICET) 5-325 MG per tablet 1 tablet  1 tablet Oral Q6H PRN Clayburn Pert, MD   1 tablet at 02/28/15 1457  . pantoprazole (PROTONIX) EC tablet 40 mg  40 mg Oral QHS Clayburn Pert, MD   40 mg at 02/27/15 2151  . piperacillin-tazobactam (ZOSYN) IVPB 3.375 g  3.375 g Intravenous Q8H Clayburn Pert, MD   3.375 g at 02/28/15 1048  . sodium chloride 0.9 % bolus 2,000 mL  2,000 mL Intravenous Once Carrie Mew, MD   2,000 mL at 02/21/15 1155  . vancomycin (VANCOCIN) 1,500 mg in sodium chloride 0.9 % 500 mL IVPB  1,500 mg Intravenous Once Carrie Mew, MD   1,500 mg at 02/21/15 1155     Discharge Medications: Please see discharge summary for a list of discharge medications.  Relevant Imaging Results:  Relevant Lab Results:   Additional Information  (SSN [id number])  Lorenso Quarry Doran Nestle, LCSW

## 2015-02-28 NOTE — Progress Notes (Signed)
[name] at [place] NAME: [name]    MR#:  [id number]  DATE OF BIRTH:  [birth year]  SUBJECTIVE:  Patient's ostomy drain fell out yesterday. No other acute events overnight. Patient has no complaints morning.  REVIEW OF SYSTEMS:    Review of Systems  Constitutional: Negative for fever and chills.  HENT: Negative for sore throat.   Eyes: Negative for blurred vision, double vision and pain.  Respiratory: Negative for cough, hemoptysis, shortness of breath and wheezing.   Cardiovascular: Negative for chest pain, palpitations, orthopnea and leg swelling.  Gastrointestinal: Negative for heartburn, nausea, vomiting, abdominal pain, diarrhea and constipation.  Genitourinary: Negative for dysuria and hematuria.  Musculoskeletal: Negative for back pain and joint pain.  Skin: Negative for rash.  Neurological: Negative for sensory change, speech change, focal weakness and headaches.  Endo/Heme/Allergies: Does not bruise/bleed easily.  Psychiatric/Behavioral: Negative for depression. The patient is not nervous/anxious.       DRUG ALLERGIES:   Allergies  Allergen Reactions  . Buspirone Other (See Comments)    Weakness  . Citalopram Other (See Comments)  . Lisinopril Cough  . Metformin Diarrhea    At 1000 mg dose  . Pravastatin Other (See Comments)    insomnia  . Sitagliptin Other (See Comments)    constipation  . Tramadol Itching  . Diltiazem Hcl Palpitations  . Gabapentin Palpitations  . Lovastatin Palpitations    VITALS:  Blood pressure 166/81, pulse 61, temperature 98 F (36.7 C), temperature source Oral, resp. rate 18, height 5\' 3"  (1.6 m), weight 108.863 kg (240 lb), SpO2 97 %.  PHYSICAL EXAMINATION:   Physical Exam  Constitutional: She is oriented to person, place, and time and well-developed, well-nourished, and in no distress. No distress.  HENT:  Head: Normocephalic.  Eyes: No scleral icterus.  Neck: Normal  range of motion. Neck supple. No JVD present. No tracheal deviation present.  Cardiovascular: Normal rate and regular rhythm.  Exam reveals no gallop and no friction rub.   Murmur heard. Pulmonary/Chest: Effort normal and breath sounds normal. No respiratory distress. She has no wheezes. She has no rales. She exhibits no tenderness.  Abdominal: Soft. Bowel sounds are normal. She exhibits no distension and no mass. There is no tenderness. There is no rebound and no guarding.  She has a large Bandage over her abdomen. Staples are clean and intact.  Musculoskeletal: Normal range of motion. She exhibits no edema.  Neurological: She is alert and oriented to person, place, and time.  Skin: Skin is warm. No rash noted. No erythema.  Psychiatric: Affect and judgment normal.      LABORATORY PANEL:   CBC  Recent Labs Lab 02/28/15 0402  WBC 9.7  HGB 8.2*  HCT 25.9*  PLT 299   ------------------------------------------------------------------------------------------------------------------  Chemistries   Recent Labs Lab 02/22/15 0414  02/28/15 0402  NA 143  < > 140  K 4.2  < > 4.1  CL 108  < > 106  CO2 26  < > 26  GLUCOSE 73  < > 119*  BUN 19  < > 15  CREATININE 1.77*  < > 2.08*  CALCIUM 6.5*  < > 6.3*  MG 1.6*  --   --   AST 14*  --   --   ALT 13*  --   --   ALKPHOS 86  --   --   BILITOT 0.7  --   --   < > = values  in this interval not displayed. ------------------------------------------------------------------------------------------------------------------  Cardiac Enzymes No results for input(s): TROPONINI in the last 168 hours. ------------------------------------------------------------------------------------------------------------------  RADIOLOGY:  No results found.   ASSESSMENT AND PLAN:   IMPRESSION AND PLAN: Patient is a 65 year old African-American female admitted with wound infection  1. Abdominal wall abscess with a complex wound to her left upper  abdomen due to ostomy disruption: Plans as per surgery. Plan for wound VAC eventually as per surgery.  2. Diabetes type 2: Patient has been started on a carb modified diet. As per diabetes recommendations do not start Amaryl while she is inpatient. Continue sliding scale insulin.  3. Hypertension patient is on metoprolol and Avapro. Her blood pressure still elevated this morning. I have started hydralazine 25 mg by mouth every 8 hours. I cannot increase metoprolol due to the fact the patient's heart rates are right around 60.  4. Hypothyroidism continue with Synthroid  5. Hyperlipidemia continue atorvastatin  6. Acute on chronic Anemia : This is likely due to debridement and surgery.Transfuse if < 7  7. Chronic renal failure stage [id number]. Atrial fibrillation/SVT: Patient is on amiodarone and digoxin. She has been seen by [facility] cardiology in the past.  Management plans discussed with patient and she is in agreement DVT Prophylaxis: SCDs  TOTAL TIME TAKING CARE OF THIS PATIENT: 25 minutes.    [name] M.D on 02/28/2015 at 10:58 AM  Between 7am to 6pm - Pager - [phone]  After 6pm go to [email or web address] - password [email or web address] [facility]  Office  [phone]  CC: Primary care physician; Alicia Hector, MD    Note: This dictation was prepared with Dragon dictation along with smaller phrase technology. Any transcriptional errors that result from this process are unintentional.

## 2015-02-28 NOTE — Clinical Social Work Note (Signed)
Clinical Social Work Assessment  Patient Details  Name: [name] MRN: [id number] Date of Birth: 1950-05-13  Date of referral:  02/28/15               Reason for consult:  Discharge Planning                Permission sought to share information with:  Facility Sport and exercise psychologist, Family Supports Permission granted to share information::  Yes, Verbal Permission Granted  Name::        Agency::   ([facility] )  Relationship::   Clinical cytogeneticist (Daugher) & [name] (Sister) )  Contact Information:   Lidia Clavijo ([place]) [phone] & [name] [phone])   Housing/Transportation Living arrangements for the past 2 months:  [place] (Patient was recently at [facility] for STR) Source of Information:  Patient, Other (Comment Required) ([facility] ) Patient Interpreter Needed:  None Criminal Activity/Legal Involvement Pertinent to Current Situation/Hospitalization:  No - Comment as needed Significant Relationships:  Siblings, Adult Children Lives with:  Adult Children Clinical cytogeneticist (daugher)) Do you feel safe going back to the place where you live?  Yes Need for family participation in patient care:  Yes (Comment) Clinical cytogeneticist (Daugher) & [name] (Sister) )  Care giving concerns:  Patient is from [facility] STR.    Social Worker assessment / plan:  CSW met wit patient at bedside. Patient is alert and oriented. Per patient she was recently at [facility] and she does not want to return. She reports that she would like to be considered for other SNF placements at discharge. CSW explained Medicare Observation with patient and being readmitted into the hospital within a 30 day time frame from first admission. This allows patient to use prior hospital admission (02/04/15-02/15/15) to be considered for another SNF placement option. Per patient she would go home if medically stable. She reports that she lives with her daughter [name] and her grandson. Per patient she has a rollator and  cane at home that she used prior to hospital admission (02/04/15). Patient reports "I can walk with a little help". Patient gave CSW verbal permission to contact her daugher Clinical cytogeneticist) and sister [name]) if needed.   CSW contacted Broadus John, admissions coordinator at [facility]. He reports that patient can return to [facility] pending bed availability.   FL2 and PASRR completed and faxed via [place]. Awaiting bed offers.   CSW will continue follow and assist.  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 She reports she doesn't want to return to [facility].   Patient/Family's Understanding of and Emotional Response to Diagnosis, Current Treatment, and Prognosis:  Patient was pleasant and appreciative of CSW assistance.   Emotional Assessment Appearance:  Appears stated age Attitude/Demeanor/Rapport:   (None) Affect (typically observed):  Calm, Pleasant Orientation:  Oriented to Self, Oriented to Place, Oriented to  Time, Oriented to Situation Alcohol / Substance use:  Not Applicable Psych involvement (Current and /or in the community):  No (Comment)  Discharge Needs  Concerns to be addressed:  Discharge Planning Concerns Readmission within the last 30 days:  Yes (Last admission was 02/04/15-02/15/15) Current discharge risk:  Chronically ill Barriers to Discharge:  Continued Medical Work up   [employer], LCSW 02/28/2015, 4:02 PM

## 2015-02-28 NOTE — Clinical Social Work Placement (Signed)
   CLINICAL SOCIAL WORK PLACEMENT  NOTE  Date:  02/28/2015  Patient Details  Name: [name] MRN: [id number] Date of Birth: 1950/12/20  Clinical Social Work is seeking post-discharge placement for this patient at the [place] level of care (*CSW will initial, date and re-position this form in  chart as items are completed):  Yes   Patient/family provided with [facility] Work Department's list of facilities offering this level of care within the geographic area requested by the patient (or if unable, by the patient's family).  Yes   Patient/family informed of their freedom to choose among providers that offer the needed level of care, that participate in Medicare, Medicaid or managed care program needed by the patient, have an available bed and are willing to accept the patient.  Yes   Patient/family informed of [facility]'s ownership interest in [facility] and [facility], as well as of the fact that they are under no obligation to receive care at these facilities.  PASRR submitted to EDS on       PASRR number received on       Existing PASRR number confirmed on 02/28/15     FL2 transmitted to all facilities in geographic area requested by pt/family on 02/28/15     FL2 transmitted to all facilities within larger geographic area on       Patient informed that his/her managed care company has contracts with or will negotiate with certain facilities, including the following:            Patient/family informed of bed offers received.  Patient chooses bed at       Physician recommends and patient chooses bed at      Patient to be transferred to   on  .  Patient to be transferred to facility by       Patient family notified on   of transfer.  Name of family member notified:        PHYSICIAN       Additional Comment:    _______________________________________________ Baldemar Lenis, LCSW 02/28/2015, 3:56 PM

## 2015-02-28 NOTE — Progress Notes (Signed)
[name] fecal drainage tube fell out yesterday or last night late and had not been replaced. Is no complaints minimal abdominal pain no nausea or vomiting  Vital signs are stable she is afebrile Morbidly obese no acute distress Abdomen is soft and nontender to ostomy. A new drainage tube is placed personally It is not sutured in place at this time. RN is instructed on replacement should fall out again.  The wound is continuing to heal with secondary intention with granulation tissue. Continue current care.

## 2015-02-28 NOTE — Progress Notes (Signed)
Pt's ostomy drain to foley bag fell out onto floor when pt was sitting on the toilet. No leakage, dressing reinforced. MD notified. Pt had been having formed stools seeping into wound with the drain in place. Pt resting comfortably. Will continue to assess.

## 2015-02-28 NOTE — Progress Notes (Signed)
Initial Nutrition Assessment     INTERVENTION:  Meals and snacks: Cater to pt preferences, pt may benefit from carb modified diet Medical Nutrition Supplement Therapy: Will need to add supplement if unable to meet nutritional needs for wound healing   NUTRITION DIAGNOSIS:   Increased nutrient needs related to wound healing as evidenced by estimated needs.    GOAL:   Patient will meet greater than or equal to 90% of their needs    MONITOR:    (Energy intake, Digestive system)  REASON FOR ASSESSMENT:   LOS    ASSESSMENT:      Pt admitted with large abdominal wound to left upper quadrant around her ostomy.    Past Medical History  Diagnosis Date  . Diabetes mellitus without complication ([place])   . GI bleed   . Aortic valve stenosis   . Hyperlipidemia   . Hypertension   . Diverticulitis large intestine   . Obesity   . Shortness of breath dyspnea   . Hyperthyroidism   . Anxiety   . Arthritis   . Neuropathy in diabetes ([place])   . Anemia   . Vertigo   . Bowel obstruction ([facility])     Current Nutrition: Pt reports eating well during admission  Food/Nutrition-Related History: normal intake prior to admission   Scheduled Medications:  . amiodarone  200 mg Oral Daily  . atorvastatin  40 mg Oral QHS  . digoxin  0.0625 mg Oral Daily  . DULoxetine  30 mg Oral Daily  . hydrALAZINE  25 mg Oral 3 times per day  . insulin aspart  0-9 Units Subcutaneous TID WC  . irbesartan  300 mg Oral Daily  . levothyroxine  88 mcg Oral BH-q7a  . metoprolol succinate  50 mg Oral BID  . pantoprazole  40 mg Oral QHS  . piperacillin-tazobactam (ZOSYN)  IV  3.375 g Intravenous Q8H  . sodium chloride  2,000 mL Intravenous Once  . vancomycin  1,500 mg Intravenous Once       Electrolyte/Renal Profile and Glucose Profile:   Recent Labs Lab 02/22/15 0414 02/23/15 0403 02/26/15 0539 02/28/15 0402  NA 143 139 141 140  K 4.2 4.2 4.2 4.1  CL 108 106 108 106  CO2 26 25 25 26   BUN  19 16 13 15   CREATININE 1.77* 1.91* 2.21* 2.08*  CALCIUM 6.5* 6.5* 6.3* 6.3*  MG 1.6*  --   --   --   PHOS 4.3  --   --   --   GLUCOSE 73 142* 118* 119*   Protein Profile:  Recent Labs Lab 02/22/15 0414 02/26/15 0539  ALBUMIN 2.3* 2.1*    Gastrointestinal Profile: Last BM: 1/9   Unable to complete Nutrition-Focused physical exam at this time. Getting up to go to bathroom during visit   Weight Change: pt reports stable wt     Diet Order:  Diet regular Room service appropriate?: Yes; Fluid consistency:: Thin  Skin:   reviewed   Height:   Ht Readings from Last 1 Encounters:  02/21/15 5\' 3"  (1.6 m)    Weight:   Wt Readings from Last 1 Encounters:  02/21/15 240 lb (108.863 kg)    Ideal Body Weight:     BMI:  Body mass index is 42.52 kg/(m^2).  Estimated Nutritional Needs:   Kcal:  BEE 1034 kcals (IF 1.0-1.2, AF 1.3)  [id number] kcals/d Using IBW of 52kg   Protein:  (1.2-1.5 g/kg) 62-78 g/d  Fluid:  (25-83ml/kg) 1300-1570ml/d  EDUCATION NEEDS:  No education needs identified at this time  [place]. Zenia Resides, [place], [place] (pager) Weekend/On-Call pager [phone])

## 2015-03-01 LAB — BASIC METABOLIC PANEL
Anion gap: 6 (ref 5–15)
BUN: 15 mg/dL (ref 6–20)
CHLORIDE: 106 mmol/L (ref 101–111)
CO2: 26 mmol/L (ref 22–32)
CREATININE: 2.08 mg/dL — AB (ref 0.44–1.00)
Calcium: 6.1 mg/dL — CL (ref 8.9–10.3)
GFR calc Af Amer: 28 mL/min — ABNORMAL LOW (ref >60)
GFR calc non Af Amer: 24 mL/min — ABNORMAL LOW (ref >60)
GLUCOSE: 131 mg/dL — AB (ref 65–99)
Potassium: 3.9 mmol/L (ref 3.5–5.1)
Sodium: 138 mmol/L (ref 135–145)

## 2015-03-01 LAB — CALCIUM, IONIZED: Calcium, Ionized, Serum: 3.4 mg/dL — ABNORMAL LOW (ref 4.5–5.6)

## 2015-03-01 LAB — GLUCOSE, CAPILLARY
GLUCOSE-CAPILLARY: 127 mg/dL — AB (ref 65–99)
GLUCOSE-CAPILLARY: 141 mg/dL — AB (ref 65–99)
Glucose-Capillary: 98 mg/dL (ref 65–99)
Glucose-Capillary: 147 mg/dL — ABNORMAL HIGH (ref 65–99)

## 2015-03-01 LAB — ALBUMIN: Albumin: 2.2 g/dL — ABNORMAL LOW (ref 3.5–5.0)

## 2015-03-01 NOTE — Progress Notes (Signed)
[place] at [place] NAME: [name]    MR#:  [id number]  DATE OF BIRTH:  1950/08/24  SUBJECTIVE:  Ostomy draining back in Place. No acute issues overnight.  REVIEW OF SYSTEMS:    Review of Systems  Constitutional: Negative for fever and chills.  HENT: Negative for sore throat.   Eyes: Negative for blurred vision, double vision and pain.  Respiratory: Negative for cough, hemoptysis, shortness of breath and wheezing.   Cardiovascular: Negative for chest pain, palpitations, orthopnea and leg swelling.  Gastrointestinal: Negative for heartburn, nausea, vomiting, abdominal pain, diarrhea and constipation.  Genitourinary: Negative for dysuria and hematuria.  Musculoskeletal: Negative for back pain and joint pain.  Skin: Negative for rash.  Neurological: Negative for sensory change, speech change, focal weakness and headaches.  Endo/Heme/Allergies: Does not bruise/bleed easily.  Psychiatric/Behavioral: Negative for depression. The patient is not nervous/anxious.       DRUG ALLERGIES:   Allergies  Allergen Reactions  . Buspirone Other (See Comments)    Weakness  . Citalopram Other (See Comments)  . Lisinopril Cough  . Metformin Diarrhea    At 1000 mg dose  . Pravastatin Other (See Comments)    insomnia  . Sitagliptin Other (See Comments)    constipation  . Tramadol Itching  . Diltiazem Hcl Palpitations  . Gabapentin Palpitations  . Lovastatin Palpitations    VITALS:  Blood pressure 158/65, pulse 59, temperature 98.4 F (36.9 C), temperature source Oral, resp. rate 18, height 5\' 3"  (1.6 m), weight 108.863 kg (240 lb), SpO2 97 %.  PHYSICAL EXAMINATION:   Physical Exam  Constitutional: She is oriented to person, place, and time and well-developed, well-nourished, and in no distress. No distress.  HENT:  Head: Normocephalic.  Eyes: No scleral icterus.  Neck: Normal range of motion. Neck supple. No JVD present. No  tracheal deviation present.  Cardiovascular: Normal rate and regular rhythm.  Exam reveals no gallop and no friction rub.   Murmur heard. Pulmonary/Chest: Effort normal and breath sounds normal. No respiratory distress. She has no wheezes. She has no rales. She exhibits no tenderness.  Abdominal: Soft. Bowel sounds are normal. She exhibits no distension and no mass. There is no tenderness. There is no rebound and no guarding.  She has a large Bandage over her abdomen. Staples are clean and intact.  Musculoskeletal: Normal range of motion. She exhibits no edema.  Neurological: She is alert and oriented to person, place, and time.  Skin: Skin is warm. No rash noted. No erythema.  Psychiatric: Affect and judgment normal.      LABORATORY PANEL:   CBC  Recent Labs Lab 02/28/15 0402  WBC 9.7  HGB 8.2*  HCT 25.9*  PLT 299   ------------------------------------------------------------------------------------------------------------------  Chemistries   Recent Labs Lab 03/01/15 0702  NA 138  K 3.9  CL 106  CO2 26  GLUCOSE 131*  BUN 15  CREATININE 2.08*  CALCIUM 6.1*   ------------------------------------------------------------------------------------------------------------------  Cardiac Enzymes No results for input(s): TROPONINI in the last 168 hours. ------------------------------------------------------------------------------------------------------------------  RADIOLOGY:  No results found.   ASSESSMENT AND PLAN:   IMPRESSION AND PLAN: Patient is a 65 year old African-American female admitted with wound infection  1. Abdominal wall abscess with a complex wound to her left upper abdomen due to ostomy disruption: Plans as per surgery. Plan for wound VAC eventually as per surgery.  2. Diabetes type 2: Patient has been started on a carb modified diet. As per diabetes recommendations do  not start Amaryl while she is inpatient. Continue sliding scale insulin.  3.  Hypertension patient is on metoprolol and Avapro. Her blood pressure still elevated this morning. I  started hydralazine 25 mg by mouth every 8 hours on January 10. I will allow one or 2 more days to see if this blood pressure medication helps lower her blood pressure before increasing the dose. I cannot increase metoprolol due to the fact the patient's heart rates are right around 60.  4. Hypothyroidism continue with Synthroid  5. Hyperlipidemia continue atorvastatin  6. Acute on chronic Anemia : This is likely due to debridement and surgery.Transfuse if < 7  7. Chronic renal failure stage [id number]. Atrial fibrillation/SVT: Patient is on amiodarone and digoxin. She has been seen by [facility] cardiology in the past.  9. Hypocalcemia: This is due to low albumin. Her last albumin is 2.1. I will check an albumin level today.  Management plans discussed with patient and she is in agreement DVT Prophylaxis: SCDs  TOTAL TIME TAKING CARE OF THIS PATIENT: 25 minutes.    Penda Venturi M.D on 03/01/2015 at 10:22 AM  Between 7am to 6pm - Pager - [phone]  After 6pm go to [email or web address] - password [email or web address] [facility]  Office  [phone]  CC: Primary care physician; Adin Hector, MD    Note: This dictation was prepared with Dragon dictation along with smaller phrase technology. Any transcriptional errors that result from this process are unintentional.

## 2015-03-01 NOTE — Progress Notes (Signed)
Pharmacy Antibiotic Follow-up Note  [name] is a 65 y.o. year-old female admitted on 02/21/2015.  The patient is currently on day 9 of Zosyn for abdominal abscess.  Assessment/Plan: Spoke to IM MD regarding duration of therapy for Zosyn. MD wants to leave duration of therapy to surgery. Will continue Zosyn 3.375 g EI q 8 hours.   Temp (24hrs), Avg:98.4 F (36.9 C), Min:98.3 F (36.8 C), Max:98.5 F (36.9 C)   Recent Labs Lab 02/23/15 0403 02/25/15 0806 02/26/15 0539 02/28/15 0402  WBC 9.8 7.9 9.4 9.7    Recent Labs Lab 02/23/15 0403 02/26/15 0539 02/28/15 0402 03/01/15 0702  CREATININE 1.91* 2.21* 2.08* 2.08*   Estimated Creatinine Clearance: 31.9 mL/min (by C-G formula based on Cr of 2.08).    Allergies  Allergen Reactions  . Buspirone Other (See Comments)    Weakness  . Citalopram Other (See Comments)  . Lisinopril Cough  . Metformin Diarrhea    At 1000 mg dose  . Pravastatin Other (See Comments)    insomnia  . Sitagliptin Other (See Comments)    constipation  . Tramadol Itching  . Diltiazem Hcl Palpitations  . Gabapentin Palpitations  . Lovastatin Palpitations    Antimicrobials this admission: Zosyn 1/3 >>    Microbiology results: 1/3 BCx: negative x  1/3 UCx: negative  1/4 MRSA PCR: negative  Thank you for allowing pharmacy to be a part of this patient's care.  Ulice Dash D PharmD 03/01/2015 10:50 AM

## 2015-03-01 NOTE — Progress Notes (Signed)
Patient seen earlier today and examined she has no complaints today minimal output from her ostomy today. Vital signs reviewed. Abdomen is soft nontender minimal stool coming from the tube. Granulation tissue present no erythema no purulence Calves are nontender. Patient doing well continue current therapy may consider switching to a bag as granulation tissue improves and progresses.

## 2015-03-01 NOTE — Progress Notes (Signed)
Dr. Benjie Karvonen notified of  Calcium of 6.1 no new order at this  time.

## 2015-03-01 NOTE — Progress Notes (Signed)
Scant brown thick stool around the drainage tube noted on the stoma site. Dressing changed to stoma site x 2 today .

## 2015-03-02 LAB — GLUCOSE, CAPILLARY
GLUCOSE-CAPILLARY: 157 mg/dL — AB (ref 65–99)
Glucose-Capillary: 98 mg/dL (ref 65–99)
Glucose-Capillary: 101 mg/dL — ABNORMAL HIGH (ref 65–99)
Glucose-Capillary: 128 mg/dL — ABNORMAL HIGH (ref 65–99)

## 2015-03-02 MED ORDER — HYDRALAZINE HCL 50 MG PO TABS
50.0000 mg | ORAL_TABLET | Freq: Three times a day (TID) | ORAL | Status: DC
  Administered 2015-03-02 – 2015-03-13 (×33): 50 mg via ORAL
  Filled 2015-03-02 (×34): qty 1

## 2015-03-02 MED ORDER — SODIUM CHLORIDE 0.9 % IV SOLN
2.0000 g | Freq: Once | INTRAVENOUS | Status: AC
  Administered 2015-03-02: 2 g via INTRAVENOUS
  Filled 2015-03-02: qty 20

## 2015-03-02 NOTE — Progress Notes (Signed)
4 Days Post-Op  Subjective: Patient feeling well today tolerating a diet ostomy output has been minimal.  Objective: Vital signs in last 24 hours: Temp:  [98 F (36.7 C)-98.5 F (36.9 C)] 98 F (36.7 C) (01/12 0607) Pulse Rate:  [57-81] 57 (01/12 0607) Resp:  [16-18] 18 (01/11 2059) BP: (93-170)/(70-78) 170/75 mmHg (01/12 0607) SpO2:  [96 %-99 %] 99 % (01/12 0607) Last BM Date: 02/27/15  Intake/Output from previous day: 01/11 0701 - 01/12 0700 In: 168 [P.O.:120; IV Piggyback:48] Out: [id number] [Urine:1825] Intake/Output this shift:    Physical exam:  Minimal stool in bag ostomy is granulating there is some fibrillar no purulent material and some necrosis on the granulation tissue. No erythema to suggest invasive infection.  Lab Results: CBC   Recent Labs  02/28/15 0402  WBC 9.7  HGB 8.2*  HCT 25.9*  PLT 299   BMET  Recent Labs  02/28/15 0402 03/01/15 0702  NA 140 138  K 4.1 3.9  CL 106 106  CO2 26 26  GLUCOSE 119* 131*  BUN 15 15  CREATININE 2.08* 2.08*  CALCIUM 6.3* 6.1*   PT/INR No results for input(s): LABPROT, INR in the last 72 hours. ABG No results for input(s): PHART, HCO3 in the last 72 hours.  Invalid input(s): PCO2, PO2  Studies/Results: No results found.  Anti-infectives: Anti-infectives    Start     Dose/Rate Route Frequency Ordered Stop   02/21/15 1900  piperacillin-tazobactam (ZOSYN) IVPB 3.375 g  Status:  Discontinued     3.375 g 12.5 mL/hr over 240 Minutes Intravenous 3 times per day 02/21/15 1453 02/21/15 1504   02/21/15 1700  piperacillin-tazobactam (ZOSYN) IVPB 3.375 g     3.375 g 12.5 mL/hr over 240 Minutes Intravenous Every 8 hours 02/21/15 1504     02/21/15 1045  vancomycin (VANCOCIN) 1,500 mg in sodium chloride 0.9 % 500 mL IVPB     1,500 mg 250 mL/hr over 120 Minutes Intravenous  Once 02/21/15 1021     02/21/15 1030  piperacillin-tazobactam (ZOSYN) IVPB 3.375 g     3.375 g 12.5 mL/hr over 240 Minutes Intravenous  Once  02/21/15 1021 02/21/15 1151      Assessment/Plan: s/p Procedure(s): EXCISION MASS ABDOMINAL/wash out APPLICATION OF WOUND VAC   Patient slowly improving. We will plan to return to the operating room tomorrow for debridement and possible delayed primary closure of more of the lateral portion of this wound over a Penrose. This hopefully will enhance and hasten closure of this complex wound. The options rationale and risks were discussed in detail with the patient.  Florene Glen, MD, FACS  03/02/2015

## 2015-03-03 ENCOUNTER — Inpatient Hospital Stay: Payer: PPO | Admitting: Anesthesiology

## 2015-03-03 ENCOUNTER — Encounter
Admission: EM | Disposition: A | Discharge status: Home Health | Payer: Self-pay | Source: Home / Self Care | Attending: General Surgery

## 2015-03-03 ENCOUNTER — Encounter: Payer: Self-pay | Admitting: *Deleted

## 2015-03-03 ENCOUNTER — Inpatient Hospital Stay: Payer: PPO

## 2015-03-03 DIAGNOSIS — T8149XA Infection following a procedure, other surgical site, initial encounter: Secondary | ICD-10-CM | POA: Insufficient documentation

## 2015-03-03 DIAGNOSIS — T814XXD Infection following a procedure, subsequent encounter: Secondary | ICD-10-CM

## 2015-03-03 DIAGNOSIS — L03311 Cellulitis of abdominal wall: Secondary | ICD-10-CM

## 2015-03-03 HISTORY — PX: WOUND DEBRIDEMENT: SHX247

## 2015-03-03 LAB — CBC
HCT: 26.5 % — ABNORMAL LOW (ref 35.0–47.0)
Hemoglobin: 8.4 g/dL — ABNORMAL LOW (ref 12.0–16.0)
MCH: 26.3 pg (ref 26.0–34.0)
MCHC: 31.6 g/dL — ABNORMAL LOW (ref 32.0–36.0)
MCV: 83.3 fL (ref 80.0–100.0)
PLATELETS: 296 10*3/uL (ref 150–440)
RBC: 3.18 MIL/uL — AB (ref 3.80–5.20)
RDW: 15.6 % — ABNORMAL HIGH (ref 11.5–14.5)
WBC: 5.9 10*3/uL (ref 3.6–11.0)

## 2015-03-03 LAB — GLUCOSE, CAPILLARY
GLUCOSE-CAPILLARY: 114 mg/dL — AB (ref 65–99)
GLUCOSE-CAPILLARY: 115 mg/dL — AB (ref 65–99)
GLUCOSE-CAPILLARY: 160 mg/dL — AB (ref 65–99)
GLUCOSE-CAPILLARY: 144 mg/dL — AB (ref 65–99)
Glucose-Capillary: 123 mg/dL — ABNORMAL HIGH (ref 65–99)

## 2015-03-03 LAB — BASIC METABOLIC PANEL
Anion gap: 9 (ref 5–15)
BUN: 17 mg/dL (ref 6–20)
CALCIUM: 6.6 mg/dL — AB (ref 8.9–10.3)
CO2: 27 mmol/L (ref 22–32)
CREATININE: 1.99 mg/dL — AB (ref 0.44–1.00)
Chloride: 104 mmol/L (ref 101–111)
GFR, EST AFRICAN AMERICAN: 29 mL/min — AB (ref >60)
GFR, EST NON AFRICAN AMERICAN: 25 mL/min — AB (ref >60)
Glucose, Bld: 117 mg/dL — ABNORMAL HIGH (ref 65–99)
Potassium: 3.8 mmol/L (ref 3.5–5.1)
SODIUM: 140 mmol/L (ref 135–145)

## 2015-03-03 SURGERY — DEBRIDEMENT, WOUND, ABDOMEN
Anesthesia: General

## 2015-03-03 MED ORDER — ONDANSETRON HCL 4 MG/2ML IJ SOLN: 4.0000 mg | Freq: Once | INTRAMUSCULAR | Status: DC | PRN

## 2015-03-03 MED ORDER — PROPOFOL 10 MG/ML IV BOLUS
INTRAVENOUS | Status: DC | PRN
  Administered 2015-03-03 (×7): 20 mg via INTRAVENOUS
  Administered 2015-03-03 (×2): 30 mg via INTRAVENOUS

## 2015-03-03 MED ORDER — FENTANYL CITRATE (PF) 100 MCG/2ML IJ SOLN
INTRAMUSCULAR | Status: AC
  Administered 2015-03-03: 25 ug via INTRAVENOUS
  Filled 2015-03-03: qty 2

## 2015-03-03 MED ORDER — HYDRALAZINE HCL 20 MG/ML IJ SOLN
5.0000 mg | INTRAMUSCULAR | Status: AC
  Administered 2015-03-03 (×2): 5 mg via INTRAVENOUS

## 2015-03-03 MED ORDER — HYDRALAZINE HCL 20 MG/ML IJ SOLN
INTRAMUSCULAR | Status: AC
  Administered 2015-03-03: 5 mg via INTRAVENOUS
  Filled 2015-03-03: qty 1

## 2015-03-03 MED ORDER — LIDOCAINE-EPINEPHRINE 1 %-1:100000 IJ SOLN
INTRAMUSCULAR | Status: AC
  Filled 2015-03-03: qty 1

## 2015-03-03 MED ORDER — MIDAZOLAM HCL 2 MG/2ML IJ SOLN
INTRAMUSCULAR | Status: DC | PRN
  Administered 2015-03-03: 2 mg via INTRAVENOUS

## 2015-03-03 MED ORDER — LIDOCAINE-EPINEPHRINE 1 %-1:100000 IJ SOLN
INTRAMUSCULAR | Status: DC | PRN
  Administered 2015-03-03: 20 mL

## 2015-03-03 MED ORDER — FENTANYL CITRATE (PF) 100 MCG/2ML IJ SOLN
25.0000 ug | INTRAMUSCULAR | Status: DC | PRN
  Administered 2015-03-03 (×4): 25 ug via INTRAVENOUS

## 2015-03-03 SURGICAL SUPPLY — 23 items
CANISTER SUCT 1200ML W/VALVE (MISCELLANEOUS) ×2 IMPLANT
CATH TRAY 16F METER LATEX (MISCELLANEOUS) ×2 IMPLANT
DRAIN CHANNEL 32F RND 10.7 FF (WOUND CARE) ×2 IMPLANT
DRAIN CHANNEL JP 19F (MISCELLANEOUS) ×2 IMPLANT
DRAIN PENROSE 5/8X18 LTX STRL (WOUND CARE) IMPLANT
DRAPE LAPAROTOMY 100X77 ABD (DRAPES) ×2 IMPLANT
ELECT CAUTERY NEEDLE TIP 1.0 (MISCELLANEOUS) ×2
ELECTRODE CAUTERY NEDL TIP 1.0 (MISCELLANEOUS) ×1 IMPLANT
GAUZE SPONGE 4X4 12PLY STRL (GAUZE/BANDAGES/DRESSINGS) ×2 IMPLANT
GLOVE BIO SURGEON STRL SZ8 (GLOVE) ×4 IMPLANT
GOWN STRL REUS W/ TWL LRG LVL3 (GOWN DISPOSABLE) ×2 IMPLANT
GOWN STRL REUS W/TWL LRG LVL3 (GOWN DISPOSABLE) ×2
KIT RM TURNOVER STRD PROC AR (KITS) ×2 IMPLANT
LABEL OR SOLS (LABEL) ×2 IMPLANT
NS IRRIG 1000ML POUR BTL (IV SOLUTION) ×2 IMPLANT
PACK BASIN MAJOR ARMC (MISCELLANEOUS) ×2 IMPLANT
PAD ABD DERMACEA PRESS 5X9 (GAUZE/BANDAGES/DRESSINGS) ×2 IMPLANT
PAD GROUND ADULT SPLIT (MISCELLANEOUS) ×2 IMPLANT
STAPLER SKIN PROX 35W (STAPLE) IMPLANT
SUT CHROMIC 0 CT 1 (SUTURE) IMPLANT
SUT CHROMIC BR 1/2CLE 2-0 54IN (SUTURE) IMPLANT
SUT MAXON ABS #0 GS21 30IN (SUTURE) IMPLANT
SUT PROLENE 0 CT 1 30 (SUTURE) ×4 IMPLANT

## 2015-03-03 NOTE — Transfer of Care (Signed)
Immediate Anesthesia Transfer of Care Note  Patient: [name]  Procedure(s) Performed: Procedure(s): DEBRIDEMENT ABDOMINAL WOUND (N/A)  Patient Location: PACU  Anesthesia Type:General  Level of Consciousness: awake  Airway & Oxygen Therapy: Patient Spontanous Breathing  Post-op Assessment: Report given to RN  Post vital signs: stable  Last Vitals:  Filed Vitals:   03/03/15 0602 03/03/15 0637  BP: 179/83 150/56  Pulse: 55 56  Temp:  36.5 C  Resp:  19    Complications: No apparent anesthesia complications

## 2015-03-03 NOTE — Anesthesia Preprocedure Evaluation (Addendum)
Anesthesia Evaluation  Patient identified by MRN, date of birth, ID band Patient awake    Reviewed: Allergy & Precautions, H&P , NPO status , Patient's Chart, lab work & pertinent test results  History of Anesthesia Complications Negative for: history of anesthetic complications  Airway Mallampati: III  TM Distance: >3 FB Neck ROM: limited    Dental no notable dental hx. (+) Poor Dentition, Chipped   Pulmonary shortness of breath, former smoker,    Pulmonary exam normal breath sounds clear to auscultation       Cardiovascular Exercise Tolerance: Poor hypertension, Pt. on medications and Pt. on home beta blockers (-) angina+ PND  (-) Past MI Normal cardiovascular exam Rhythm:regular Rate:Normal     Neuro/Psych PSYCHIATRIC DISORDERS Anxiety negative neurological ROS     GI/Hepatic negative GI ROS, Neg liver ROS,   Endo/Other  diabetes, Poorly Controlled, Type 2, Oral Hypoglycemic AgentsHyperthyroidism Morbid obesity  Renal/GU Renal disease  negative genitourinary   Musculoskeletal  (+) Arthritis ,   Abdominal   Peds  Hematology negative hematology ROS (+) anemia ,   Anesthesia Other Findings Past Medical History:   Diabetes mellitus without complication ([place])                 GI bleed                                                     Aortic valve stenosis                                        Hyperlipidemia                                               Hypertension                                                 Diverticulitis large intestine                               Obesity                                                      Shortness of breath dyspnea                                  Hyperthyroidism                                              Anxiety  Arthritis                                                    Neuropathy in diabetes ([place])                                  Anemia                                                       Vertigo                                                      Bowel obstruction [facility])                                     Past Surgical History:   COLECTOMY                                                     laparotomy closure of cecal perforation          05/09/2013     Comment:Dr. Marina Gravel   TONSILLECTOMY                                                 BACK SURGERY                                                    Comment:spur frmoved from lower back   CARDIAC CATHETERIZATION                                       ABDOMINAL HYSTERECTOMY                                        LAPAROTOMY                                      N/A 02/06/2015     Comment:Procedure: Laparotomy, reduction of               incarcerated parastomal hernia, repair of               parastomal hernia with mesh;  Surgeon: Elta Guadeloupe  Marina Gravel, MD;  Location: [facility] ORS;  Service:               General;  Laterality: N/A;   DEBRIDEMENT OF ABDOMINAL WALL ABSCESS           N/A 02/22/2015       Comment:Procedure: DEBRIDEMENT OF ABDOMINAL WALL               ABSCESS;  Surgeon: Clayburn Pert, MD;                Location: [facility] ORS;  Service: General;                Laterality: N/A;  BMI    Body Mass Index   42.52 kg/m 2      Reproductive/Obstetrics negative OB ROS                            Anesthesia Physical  Anesthesia Plan  ASA: III  Anesthesia Plan: General   Post-op Pain Management:    Induction: Intravenous  Airway Management Planned: Simple Face Mask  Additional Equipment:   Intra-op Plan:   Post-operative Plan:   Informed Consent: I have reviewed the patients History and Physical, chart, labs and discussed the procedure including the risks, benefits and alternatives for the proposed anesthesia with the patient or authorized representative who has indicated his/her understanding and  acceptance.   Dental Advisory Given  Plan Discussed with: Anesthesiologist, CRNA and Surgeon  Anesthesia Plan Comments:        Anesthesia Quick Evaluation

## 2015-03-03 NOTE — Progress Notes (Signed)
Pharmacy Antibiotic Follow-up Note  [name] is a 65 y.o. year-old female admitted on 02/21/2015.  The patient is currently on day 11 of Zosyn for abdominal abscess.  Assessment/Plan: Spoke to IM MD regarding duration of therapy for Zosyn. MD wants to leave duration of therapy to surgery. Will continue Zosyn 3.375 g EI q 8 hours.   Temp (24hrs), Avg:97.9 F (36.6 C), Min:97.7 F (36.5 C), Max:98.1 F (36.7 C)   Recent Labs Lab 02/25/15 0806 02/26/15 0539 02/28/15 0402 03/03/15 0419  WBC 7.9 9.4 9.7 5.9     Recent Labs Lab 02/26/15 0539 02/28/15 0402 03/01/15 0702 03/03/15 0419  CREATININE 2.21* 2.08* 2.08* 1.99*   Estimated Creatinine Clearance: 33.4 mL/min (by C-G formula based on Cr of 1.99).    Allergies  Allergen Reactions  . Buspirone Other (See Comments)    Weakness  . Citalopram Other (See Comments)  . Lisinopril Cough  . Metformin Diarrhea    At 1000 mg dose  . Pravastatin Other (See Comments)    insomnia  . Sitagliptin Other (See Comments)    constipation  . Tramadol Itching  . Diltiazem Hcl Palpitations  . Gabapentin Palpitations  . Lovastatin Palpitations    Antimicrobials this admission: Zosyn 1/3 >>    Microbiology results: 1/3 BCx: negative x 2 1/3 UCx: negative  1/4 MRSA PCR: negative  Thank you for allowing pharmacy to be a part of this patient's care.  Ulice Dash D PharmD 03/03/2015 10:09 AM

## 2015-03-03 NOTE — Progress Notes (Signed)
[place] [facility] [place] NAME: [name]    MR#:  [id number]  DATE OF BIRTH:  03-29-1950  SUBJECTIVE:  Patient has no complaints. Patient is scheduled to go to operating room this morning for further debridement  REVIEW OF SYSTEMS:    Review of Systems  Constitutional: Negative for fever and chills.  HENT: Negative for sore throat.   Eyes: Negative for blurred vision, double vision and pain.  Respiratory: Negative for cough, hemoptysis, shortness of breath and wheezing.   Cardiovascular: Negative for chest pain, palpitations, orthopnea and leg swelling.  Gastrointestinal: Negative for heartburn, nausea, vomiting, abdominal pain, diarrhea and constipation.  Genitourinary: Negative for dysuria and hematuria.  Musculoskeletal: Negative for back pain and joint pain.  Skin: Negative for rash.  Neurological: Negative for sensory change, speech change, focal weakness and headaches.  Endo/Heme/Allergies: Does not bruise/bleed easily.  Psychiatric/Behavioral: Negative for depression. The patient is not nervous/anxious.       DRUG ALLERGIES:   Allergies  Allergen Reactions  . Buspirone Other (See Comments)    Weakness  . Citalopram Other (See Comments)  . Lisinopril Cough  . Metformin Diarrhea    At 1000 mg dose  . Pravastatin Other (See Comments)    insomnia  . Sitagliptin Other (See Comments)    constipation  . Tramadol Itching  . Diltiazem Hcl Palpitations  . Gabapentin Palpitations  . Lovastatin Palpitations    VITALS:  Blood pressure 173/77, pulse 58, temperature 98.1 F (36.7 C), temperature source Oral, resp. rate 14, height 5\' 3"  (1.6 m), weight 108.863 kg (240 lb), SpO2 99 %.  PHYSICAL EXAMINATION:   Physical Exam  Constitutional: She is oriented to person, place, and time and well-developed, well-nourished, and in no distress. No distress.  HENT:  Head: Normocephalic.  Eyes: No scleral icterus.  Neck: Normal  range of motion. Neck supple. No JVD present. No tracheal deviation present.  Cardiovascular: Normal rate and regular rhythm.  Exam reveals no gallop and no friction rub.   Murmur heard. Pulmonary/Chest: Effort normal and breath sounds normal. No respiratory distress. She has no wheezes. She has no rales. She exhibits no tenderness.  Abdominal: Soft. Bowel sounds are normal. She exhibits no distension and no mass. There is no tenderness. There is no rebound and no guarding.  She has a large Bandage over her abdomen. Staples are clean and intact.  Musculoskeletal: Normal range of motion. She exhibits no edema.  Neurological: She is alert and oriented to person, place, and time.  Skin: Skin is warm. No rash noted. No erythema.  Psychiatric: Affect and judgment normal.      LABORATORY PANEL:   CBC  Recent Labs Lab 03/03/15 0419  WBC 5.9  HGB 8.4*  HCT 26.5*  PLT 296   ------------------------------------------------------------------------------------------------------------------  Chemistries   Recent Labs Lab 03/03/15 0419  NA 140  K 3.8  CL 104  CO2 27  GLUCOSE 117*  BUN 17  CREATININE 1.99*  CALCIUM 6.6*   ------------------------------------------------------------------------------------------------------------------  Cardiac Enzymes No results for input(s): TROPONINI in the last 168 hours. ------------------------------------------------------------------------------------------------------------------  RADIOLOGY:  No results found.   ASSESSMENT AND PLAN:   IMPRESSION AND PLAN: Patient is a 65 year old African-American female admitted with wound infection  1. Abdominal wall abscess with a complex wound to her left upper abdomen due to ostomy disruption: Patient to undergo debridement again this morning.   2. Diabetes type 2: Patient has been started on a carb modified diet. As  per diabetes recommendations do not start Amaryl while she is  inpatient. Continue sliding scale insulin.  3. Hypertension patient is on metoprolol and Avapro.  I cannot increase metoprolol due to the fact the patient's heart rates are right around 60. Continue hydralazine 50 mg by mouth 3 times a day for today. If still elevated then would recommend increasing this to 100 mg by mouth 3 times a day  4. Hypothyroidism continue with Synthroid  5. Hyperlipidemia continue atorvastatin  6. Acute on chronic Anemia : This is likely due to debridement and surgery.Transfuse if < 7  7. Chronic renal failure stage [id number] 8. Atrial fibrillation/SVT: Patient is on amiodarone and digoxin. She has been seen by [facility] cardiology in the past.  9. Hypocalcemia: This is due to low albumin and underlying chronic disease. Calcium has been completed. Pharmacy is assisting with this.  Management plans discussed with patient and she is in agreement DVT Prophylaxis: SCDs  TOTAL TIME TAKING CARE OF THIS PATIENT: 25 minutes.  I will continue to see this patient every other day. Please comment Further questions.  Prabhleen Montemayor M.D on 03/03/2015 at 11:10 AM  Between 7am to 6pm - Pager - [phone]  After 6pm go to [email or web address] - password [email or web address] [place] [facility]  Office  [phone]  CC: Primary care physician; Adin Hector, MD    Note: This dictation was prepared with Dragon dictation along with smaller phrase technology. Any transcriptional errors that result from this process are unintentional.

## 2015-03-03 NOTE — Progress Notes (Signed)
Preoperative Review   Patient is met in the preoperative holding area. The history is reviewed in the chart and with the patient. I personally reviewed the options and rationale as well as the risks of this procedure that have been previously discussed with the patient. All questions asked by the patient and/or family were answered to their satisfaction.  Patient agrees to proceed with this procedure at this time.  Deziree Mokry E Azariah Bonura M.D. FACS  

## 2015-03-03 NOTE — Op Note (Signed)
02/21/2015 - 03/03/2015  9:56 AM  PATIENT:  [name]  65 y.o. female  PRE-OPERATIVE DIAGNOSIS: Complex abdominal wall wound  POST-OPERATIVE DIAGNOSIS:  Same  PROCEDURE: Examination under anesthesia and debridement of abdominal wall wound and delayed primary closure of a portion of the wound.  SURGEON:  [name], FACS   ANESTHESIA:   Local, MAC   Details of Procedure: This a patient with a complex abdominal wall wound with ostomy who requires debridement and examination under anesthesia for continued wound care issues. Preoperatively discussed rationale for surgery the options of observation risk bleeding infection possible closure and recurrence of wound she understood and agreed to proceed  Findings minimal necrosis superficially of the granulation tissue in one area for the most part minimal fibber no purulent exudate.  Patient was left in her hospital bed and induced to monitored anesthesia care with IV sedation by the anesthesia RNFA. A surgical pause was performed and then a digital exam of the retracted colostomy was performed.  Patient was then prepped and draped sterile fashion local anesthetic was infiltrated into the skin and subcutaneous tissues tissues on the lateral portion of the wound. Then debridement of the granulation tissue was performed utilizing a laparotomy pad for rough debridement of the existing granulation tissue. A 10 blade was utilized on image to scrape some necrotic tissue from the inferior portion of the wound measuring approximately 4 cm. Once this was accomplished additional rough debridement with a laparotomy pad was performed and the wound was further inspected.   (Prior to prepping and draping and on inspection of the wound under anesthesia it was noted that the Penrose drain had pulled out of the wound and in fact the Prolene sutures that had been placed previously had torn through the skin these were removed as was the Penrose  drain.)  It appeared that there were 2 wounds essentially bridged by a large piece of heavy tissue therefore laterally the wound cavity would be closed over a JP drain as follows. A separate incision was placed under local anesthetic to place a 8 mm round JP drain into the wound. This was sutured in with 3-0 nylon and tailored to fit the drain cavity. The wound was then closed lateral to medial with deep interrupted horizontal mattress sutures and deep figure-of-eight sutures of 0 Prolene is was done lateral to medial until the JP suction canister wouldn't hold suction. This resulted in closure of the lateral portion of the wound up to the large tissue bridge.  A single figure-of-eight 0 Prolene was placed medially near the colostomy site in an attempt to close some of the superficial wound on the medial side of the wound.  Another digital exam of the colostomy was performed and the direction of the colostomy was ascertained. A 32 French Silastic soft chest tube drain was placed into the colon through the colostomy and threaded proximal. This was then fastened to the skin with Mastisol and a Foley leg bag fastener. This connected to a Foley drain.  Kerlix wet to dry dressing was placed followed by AVD pads and silk tape. The patient tolerated this procedure well there were no complications she was taken to the recovery room in stable condition to be admitted for continued care the sponge lap needle count was correct.    Florene Glen, MD FACS

## 2015-03-03 NOTE — Anesthesia Postprocedure Evaluation (Signed)
Anesthesia Post Note  Patient: [name]  Procedure(s) Performed: Procedure(s) (LRB): DEBRIDEMENT ABDOMINAL WOUND (N/A)  Patient location during evaluation: PACU Anesthesia Type: General Level of consciousness: awake and alert Pain management: pain level controlled Vital Signs Assessment: post-procedure vital signs reviewed and stable Respiratory status: spontaneous breathing and respiratory function stable Cardiovascular status: stable Anesthetic complications: no    Last Vitals:  Filed Vitals:   03/03/15 1131 03/03/15 1208  BP: 166/80 158/80  Pulse: 62 59  Temp: 36.5 C 36.6 C  Resp: 16 15    Last Pain:  Filed Vitals:   03/03/15 1211  PainSc: 7                  Letica Giaimo K

## 2015-03-04 LAB — GLUCOSE, CAPILLARY
Glucose-Capillary: 110 mg/dL — ABNORMAL HIGH (ref 65–99)
Glucose-Capillary: 152 mg/dL — ABNORMAL HIGH (ref 65–99)
Glucose-Capillary: 120 mg/dL — ABNORMAL HIGH (ref 65–99)
Glucose-Capillary: 188 mg/dL — ABNORMAL HIGH (ref 65–99)

## 2015-03-04 NOTE — Progress Notes (Signed)
[place] at [place] NAME: [name]    MR#:  [id number]  DATE OF BIRTH:  06-01-1950  SUBJECTIVE:  Patient has no complaints. Patient is status post delayed primary closure of the lateral portion of her wound and debridement of the medial portion. Her blood pressure this morning has improved.   REVIEW OF SYSTEMS:    Review of Systems  Constitutional: Negative for fever and chills.  HENT: Negative for sore throat.   Eyes: Negative for blurred vision, double vision and pain.  Respiratory: Negative for cough, hemoptysis, shortness of breath and wheezing.   Cardiovascular: Negative for chest pain, palpitations, orthopnea and leg swelling.  Gastrointestinal: Negative for heartburn, nausea, vomiting, abdominal pain, diarrhea and constipation.  Genitourinary: Negative for dysuria and hematuria.  Musculoskeletal: Negative for back pain and joint pain.  Skin: Negative for rash.  Neurological: Negative for sensory change, speech change, focal weakness and headaches.  Endo/Heme/Allergies: Does not bruise/bleed easily.  Psychiatric/Behavioral: Negative for depression. The patient is not nervous/anxious.       DRUG ALLERGIES:   Allergies  Allergen Reactions  . Buspirone Other (See Comments)    Weakness  . Citalopram Other (See Comments)  . Lisinopril Cough  . Metformin Diarrhea    At 1000 mg dose  . Pravastatin Other (See Comments)    insomnia  . Sitagliptin Other (See Comments)    constipation  . Tramadol Itching  . Diltiazem Hcl Palpitations  . Gabapentin Palpitations  . Lovastatin Palpitations    VITALS:  Blood pressure 162/79, pulse 64, temperature 98 F (36.7 C), temperature source Oral, resp. rate 18, height 5\' 3"  (1.6 m), weight 108.863 kg (240 lb), SpO2 98 %.  PHYSICAL EXAMINATION:   Physical Exam  Constitutional: She is oriented to person, place, and time and well-developed, well-nourished, and in no distress. No  distress.  HENT:  Head: Normocephalic.  Eyes: No scleral icterus.  Neck: Normal range of motion. Neck supple. No JVD present. No tracheal deviation present.  Cardiovascular: Normal rate and regular rhythm.  Exam reveals no gallop and no friction rub.   Murmur heard. Pulmonary/Chest: Effort normal and breath sounds normal. No respiratory distress. She has no wheezes. She has no rales. She exhibits no tenderness.  Abdominal: Soft. Bowel sounds are normal. She exhibits no distension and no mass. There is no tenderness. There is no rebound and no guarding.  She has a large Bandage over her abdomen. Staples are clean and intact.  Musculoskeletal: Normal range of motion. She exhibits no edema.  Neurological: She is alert and oriented to person, place, and time.  Skin: Skin is warm. No rash noted. No erythema.  Psychiatric: Affect and judgment normal.      LABORATORY PANEL:   CBC  Recent Labs Lab 03/03/15 0419  WBC 5.9  HGB 8.4*  HCT 26.5*  PLT 296   ------------------------------------------------------------------------------------------------------------------  Chemistries   Recent Labs Lab 03/03/15 0419  NA 140  K 3.8  CL 104  CO2 27  GLUCOSE 117*  BUN 17  CREATININE 1.99*  CALCIUM 6.6*   ------------------------------------------------------------------------------------------------------------------  Cardiac Enzymes No results for input(s): TROPONINI in the last 168 hours. ------------------------------------------------------------------------------------------------------------------  RADIOLOGY:  Dg Chest Port 1 View  03/03/2015  CLINICAL DATA:  Right-sided PICC line placement. EXAM: PORTABLE CHEST 1 VIEW COMPARISON:  02/06/2015 FINDINGS: Patient slightly rotated to the left. Right-sided PICC line is present with tip at the cavoatrial junction. Lungs are adequately inflated without focal consolidation or  effusion. Cardiomediastinal silhouette and remainder of  the exam is unchanged. IMPRESSION: No acute cardiopulmonary disease. Right-sided PICC line with tip over the cavoatrial junction. Electronically Signed   By: Marin Olp M.D.   On: 03/03/2015 14:59     ASSESSMENT AND PLAN:   IMPRESSION AND PLAN: Patient is a 65 year old African-American female admitted with wound infection  1. 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   2. Diabetes type 2: Patient has been started on a carb modified diet. As per diabetes recommendations do not start Amaryl while she is inpatient. Continue sliding scale insulin.  3. Hypertension patient is on metoprolol and Avapro and hydralazine.  I cannot increase metoprolol due to the fact the patient's heart rates are right around 60. Continue hydralazine 50 mg by mouth 3 times a day. I will continue to monitor blood pressure to see if this needs to be increased.   4. Hypothyroidism continue with Synthroid  5. Hyperlipidemia continue atorvastatin  6. Acute on chronic Anemia : This is likely due to debridement and surgery.Transfuse if < 7  7. Chronic renal failure stage [id number] 8. Atrial fibrillation/SVT: Patient is on amiodarone and digoxin. She has been seen by [facility] cardiology in the past.  9. Hypocalcemia: This is due to low albumin and underlying chronic disease. Pharmacy is assisting with hypocalcemia replacement.   Management plans discussed with patient and she is in agreement DVT Prophylaxis: SCDs  TOTAL TIME TAKING CARE OF THIS PATIENT: 20 minutes.  I will continue to see this patient every other day. Please comment Further questions.  Blanch Stang M.D on 03/04/2015 at 9:11 AM  Between 7am to 6pm - Pager - [phone]  After 6pm go to [email or web address] - password [email or web address] [place] [facility]  Office  [phone]  CC: Primary care physician; Adin Hector, MD    Note: This dictation was prepared with  Dragon dictation along with smaller phrase technology. Any transcriptional errors that result from this process are unintentional.

## 2015-03-04 NOTE — Progress Notes (Signed)
1 Day Post-Op  Subjective: Status post delayed primary closure of the lateral portion of her wound and debridement of the medial portion. As no complaints minimal pain.  Objective: Vital signs in last 24 hours: Temp:  [97.7 F (36.5 C)-98.1 F (36.7 C)] 98 F (36.7 C) (01/14 0602) Pulse Rate:  [58-87] 64 (01/14 0602) Resp:  [12-23] 18 (01/14 0602) BP: (132-213)/(72-102) 162/79 mmHg (01/14 0602) SpO2:  [98 %-100 %] 98 % (01/14 0602) Weight:  [240 lb (108.863 kg)] 240 lb (108.863 kg) (01/13 0853) Last BM Date: 03/03/15  Intake/Output from previous day: 01/13 0701 - 01/14 0700 In: -  Out: 2110 [Urine:2100; Drains:10] Intake/Output this shift:    Physical exam:  Dressing is inspected there is stool present medially otherwise the wound is clean and the drains are functional.  Lab Results: CBC   Recent Labs  03/03/15 0419  WBC 5.9  HGB 8.4*  HCT 26.5*  PLT 296   BMET  Recent Labs  03/03/15 0419  NA 140  K 3.8  CL 104  CO2 27  GLUCOSE 117*  BUN 17  CREATININE 1.99*  CALCIUM 6.6*   PT/INR No results for input(s): LABPROT, INR in the last 72 hours. ABG No results for input(s): PHART, HCO3 in the last 72 hours.  Invalid input(s): PCO2, PO2  Studies/Results: Dg Chest Port 1 View  03/03/2015  CLINICAL DATA:  Right-sided PICC line placement. EXAM: PORTABLE CHEST 1 VIEW COMPARISON:  02/06/2015 FINDINGS: Patient slightly rotated to the left. Right-sided PICC line is present with tip at the cavoatrial junction. Lungs are adequately inflated without focal consolidation or effusion. Cardiomediastinal silhouette and remainder of the exam is unchanged. IMPRESSION: No acute cardiopulmonary disease. Right-sided PICC line with tip over the cavoatrial junction. Electronically Signed   By: Marin Olp M.D.   On: 03/03/2015 14:59    Anti-infectives: Anti-infectives    Start     Dose/Rate Route Frequency Ordered Stop   02/21/15 1900  piperacillin-tazobactam (ZOSYN) IVPB  3.375 g  Status:  Discontinued     3.375 g 12.5 mL/hr over 240 Minutes Intravenous 3 times per day 02/21/15 1453 02/21/15 1504   02/21/15 1700  piperacillin-tazobactam (ZOSYN) IVPB 3.375 g     3.375 g 12.5 mL/hr over 240 Minutes Intravenous Every 8 hours 02/21/15 1504     02/21/15 1045  vancomycin (VANCOCIN) 1,500 mg in sodium chloride 0.9 % 500 mL IVPB     1,500 mg 250 mL/hr over 120 Minutes Intravenous  Once 02/21/15 1021 03/03/15 2055   02/21/15 1030  piperacillin-tazobactam (ZOSYN) IVPB 3.375 g     3.375 g 12.5 mL/hr over 240 Minutes Intravenous  Once 02/21/15 1021 02/21/15 1151      Assessment/Plan: s/p Procedure(s): DEBRIDEMENT ABDOMINAL WOUND   Start routine dressing changes every 8 hours and continue current care for now this may granulate and be amenable to ostomy appliance placement later in the week.  Florene Glen, MD, FACS  03/04/2015

## 2015-03-05 LAB — BASIC METABOLIC PANEL
Anion gap: 6 (ref 5–15)
BUN: 16 mg/dL (ref 6–20)
CALCIUM: 6.5 mg/dL — AB (ref 8.9–10.3)
CHLORIDE: 105 mmol/L (ref 101–111)
CO2: 27 mmol/L (ref 22–32)
CREATININE: 2.01 mg/dL — AB (ref 0.44–1.00)
GFR calc non Af Amer: 25 mL/min — ABNORMAL LOW (ref >60)
GFR, EST AFRICAN AMERICAN: 29 mL/min — AB (ref >60)
GLUCOSE: 137 mg/dL — AB (ref 65–99)
Potassium: 3.6 mmol/L (ref 3.5–5.1)
Sodium: 138 mmol/L (ref 135–145)

## 2015-03-05 LAB — GLUCOSE, CAPILLARY
GLUCOSE-CAPILLARY: 128 mg/dL — AB (ref 65–99)
GLUCOSE-CAPILLARY: 145 mg/dL — AB (ref 65–99)
Glucose-Capillary: 166 mg/dL — ABNORMAL HIGH (ref 65–99)
Glucose-Capillary: 184 mg/dL — ABNORMAL HIGH (ref 65–99)

## 2015-03-05 LAB — CBC
HCT: 27.5 % — ABNORMAL LOW (ref 35.0–47.0)
Hemoglobin: 8.6 g/dL — ABNORMAL LOW (ref 12.0–16.0)
MCH: 26.1 pg (ref 26.0–34.0)
MCHC: 31.4 g/dL — AB (ref 32.0–36.0)
MCV: 83.2 fL (ref 80.0–100.0)
PLATELETS: 325 10*3/uL (ref 150–440)
RBC: 3.3 MIL/uL — AB (ref 3.80–5.20)
RDW: 15.3 % — AB (ref 11.5–14.5)
WBC: 5.3 10*3/uL (ref 3.6–11.0)

## 2015-03-05 NOTE — Progress Notes (Signed)
2 Days Post-Op  Subjective: No complaints this morning no pain.  Objective: Vital signs in last 24 hours: Temp:  [98 F (36.7 C)-98.1 F (36.7 C)] 98 F (36.7 C) (01/15 0533) Pulse Rate:  [70-120] 84 (01/15 0533) Resp:  [18-20] 18 (01/15 0533) BP: (138-165)/(69-80) 154/80 mmHg (01/15 0532) SpO2:  [97 %-99 %] 99 % (01/15 0533) Last BM Date: 03/04/15  Intake/Output from previous day: 01/14 0701 - 01/15 0700 In: -  Out: 220 [Urine:200; Drains:20] Intake/Output this shift:    Physical exam:  Wound is inspected. The colostomy drain tube was dislodged and fell on the floor as the dressing was being removed. The JP drain was beneath the patient, she was laying on the drain bulb and it was uncapped. Stool was present in the wound. Dressing has not been changed. Lateral portion of the wound where sutures are present show no erythema no drainage and adequate closure.  Lab Results: CBC   Recent Labs  03/03/15 0419 03/05/15 0705  WBC 5.9 5.3  HGB 8.4* 8.6*  HCT 26.5* 27.5*  PLT 296 325   BMET  Recent Labs  03/03/15 0419 03/05/15 0705  NA 140 138  K 3.8 3.6  CL 104 105  CO2 27 27  GLUCOSE 117* 137*  BUN 17 16  CREATININE 1.99* 2.01*  CALCIUM 6.6* 6.5*   PT/INR No results for input(s): LABPROT, INR in the last 72 hours. ABG No results for input(s): PHART, HCO3 in the last 72 hours.  Invalid input(s): PCO2, PO2  Studies/Results: Dg Chest Port 1 View  03/03/2015  CLINICAL DATA:  Right-sided PICC line placement. EXAM: PORTABLE CHEST 1 VIEW COMPARISON:  02/06/2015 FINDINGS: Patient slightly rotated to the left. Right-sided PICC line is present with tip at the cavoatrial junction. Lungs are adequately inflated without focal consolidation or effusion. Cardiomediastinal silhouette and remainder of the exam is unchanged. IMPRESSION: No acute cardiopulmonary disease. Right-sided PICC line with tip over the cavoatrial junction. Electronically Signed   By: Marin Olp M.D.    On: 03/03/2015 14:59    Anti-infectives: Anti-infectives    Start     Dose/Rate Route Frequency Ordered Stop   02/21/15 1900  piperacillin-tazobactam (ZOSYN) IVPB 3.375 g  Status:  Discontinued     3.375 g 12.5 mL/hr over 240 Minutes Intravenous 3 times per day 02/21/15 1453 02/21/15 1504   02/21/15 1700  piperacillin-tazobactam (ZOSYN) IVPB 3.375 g     3.375 g 12.5 mL/hr over 240 Minutes Intravenous Every 8 hours 02/21/15 1504     02/21/15 1045  vancomycin (VANCOCIN) 1,500 mg in sodium chloride 0.9 % 500 mL IVPB     1,500 mg 250 mL/hr over 120 Minutes Intravenous  Once 02/21/15 1021 03/03/15 2055   02/21/15 1030  piperacillin-tazobactam (ZOSYN) IVPB 3.375 g     3.375 g 12.5 mL/hr over 240 Minutes Intravenous  Once 02/21/15 1021 02/21/15 1151      Assessment/Plan: s/p Procedure(s): DEBRIDEMENT ABDOMINAL WOUND   Personal wound appears to be closing and the sutures are intact at this point. JP drain was not on suction and the colostomy tube had fallen out. I discussed with the RN the need for dressing change as well as replacement of the tube (1 a tube is in the room and available). I reminded the RN to keep the JP on suction as well. Transition to a larger ostomy bag with or without the colostomy tube may be possible in the next day or 2 as the lateral portion of the  wound seals.  Florene Glen, MD, FACS  03/05/2015

## 2015-03-05 NOTE — Progress Notes (Signed)
JP drain not holding suction. Suction reapplied x3 however unable to maintain negative pressure.  Notified Dr. [name] of the above. Acknowledge but no new orders received.  Will continue to monitor.

## 2015-03-05 NOTE — Progress Notes (Signed)
[place] at [place] NAME: [name]    MR#:  [id number]  DATE OF BIRTH:  [birth year]  SUBJECTIVE:  Patient has no complaints this morning. Nurse was in the room today cleaning out wound. Patient is not complaining of any pain.  REVIEW OF SYSTEMS:    Review of Systems  Constitutional: Negative for fever and chills.  HENT: Negative for sore throat.   Eyes: Negative for blurred vision, double vision and pain.  Respiratory: Negative for cough, hemoptysis, shortness of breath and wheezing.   Cardiovascular: Negative for chest pain, palpitations, orthopnea and leg swelling.  Gastrointestinal: Negative for heartburn, nausea, vomiting, abdominal pain, diarrhea and constipation.  Genitourinary: Negative for dysuria and hematuria.  Musculoskeletal: Negative for back pain and joint pain.  Skin: Negative for rash.  Neurological: Negative for sensory change, speech change, focal weakness and headaches.  Endo/Heme/Allergies: Does not bruise/bleed easily.  Psychiatric/Behavioral: Negative for depression. The patient is not nervous/anxious.       DRUG ALLERGIES:   Allergies  Allergen Reactions  . Buspirone Other (See Comments)    Weakness  . Citalopram Other (See Comments)  . Lisinopril Cough  . Metformin Diarrhea    At 1000 mg dose  . Pravastatin Other (See Comments)    insomnia  . Sitagliptin Other (See Comments)    constipation  . Tramadol Itching  . Diltiazem Hcl Palpitations  . Gabapentin Palpitations  . Lovastatin Palpitations    VITALS:  Blood pressure 154/80, pulse 84, temperature 98 F (36.7 C), temperature source Oral, resp. rate 18, height 5\' 3"  (1.6 m), weight 108.863 kg (240 lb), SpO2 99 %.  PHYSICAL EXAMINATION:   Physical Exam  Constitutional: She is oriented to person, place, and time and well-developed, well-nourished, and in no distress. No distress.  HENT:  Head: Normocephalic.  Eyes: No scleral icterus.   Neck: Normal range of motion. Neck supple. No JVD present. No tracheal deviation present.  Cardiovascular: Normal rate and regular rhythm.  Exam reveals no gallop and no friction rub.   Murmur heard. Pulmonary/Chest: Effort normal and breath sounds normal. No respiratory distress. She has no wheezes. She has no rales. She exhibits no tenderness.  Abdominal: Soft. Bowel sounds are normal. She exhibits no distension and no mass. There is no tenderness. There is no rebound and no guarding.  She has a large open wound on her abdomen. Staples are clean and intact..  Musculoskeletal: Normal range of motion. She exhibits no edema.  Neurological: She is alert and oriented to person, place, and time.  Skin: Skin is warm. No rash noted. No erythema.  Psychiatric: Affect and judgment normal.      LABORATORY PANEL:   CBC  Recent Labs Lab 03/05/15 0705  WBC 5.3  HGB 8.6*  HCT 27.5*  PLT 325   ------------------------------------------------------------------------------------------------------------------  Chemistries   Recent Labs Lab 03/05/15 0705  NA 138  K 3.6  CL 105  CO2 27  GLUCOSE 137*  BUN 16  CREATININE 2.01*  CALCIUM 6.5*   ------------------------------------------------------------------------------------------------------------------  Cardiac Enzymes No results for input(s): TROPONINI in the last 168 hours. ------------------------------------------------------------------------------------------------------------------  RADIOLOGY:  Dg Chest Port 1 View  03/03/2015  CLINICAL DATA:  Right-sided PICC line placement. EXAM: PORTABLE CHEST 1 VIEW COMPARISON:  02/06/2015 FINDINGS: Patient slightly rotated to the left. Right-sided PICC line is present with tip at the cavoatrial junction. Lungs are adequately inflated without focal consolidation or effusion. Cardiomediastinal silhouette and remainder of the exam is  unchanged. IMPRESSION: No acute cardiopulmonary disease.  Right-sided PICC line with tip over the cavoatrial junction. Electronically Signed   By: Marin Olp M.D.   On: 03/03/2015 14:59     ASSESSMENT AND PLAN:   IMPRESSION AND PLAN: Patient is a 65 year old African-American female admitted with wound infection  1. 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   2. Diabetes type 2: Patient has been started on a carb modified diet. As per diabetes recommendations do not start Amaryl while she is inpatient. Continue sliding scale insulin.  3. Hypertension patient is on metoprolol and Avapro and hydralazine.  I cannot increase metoprolol due to the fact the patient's heart rates are right around 60. Continue hydralazine 50 mg by mouth 3 times a day. Her blood pressure looks appropriate this morning. No changes today.   4. Hypothyroidism continue with Synthroid  5. Hyperlipidemia continue atorvastatin  6. Acute on chronic Anemia : This is likely due to debridement and surgery.Transfuse if < 7  7. Chronic renal failure stage [id number] 8. Atrial fibrillation/SVT: Patient is on amiodarone and digoxin. She has been seen by [facility] cardiology in the past.  9. Hypocalcemia: This is due to low albumin and underlying chronic disease. Pharmacy is assisting with hypocalcemia replacement. Calcium level 6.5 this morning. Last albumin 2.2 on January 11  Management plans discussed with patient and she is in agreement DVT Prophylaxis: SCDs  TOTAL TIME TAKING CARE OF THIS PATIENT: 20 minutes.     Maddon Horton M.D on 03/05/2015 at 10:05 AM  Between 7am to 6pm - Pager - [phone]  After 6pm go to [email or web address] - password [email or web address] [place] [facility]  Office  [phone]  CC: Primary care physician; Adin Hector, MD    Note: This dictation was prepared with Dragon dictation along with smaller phrase technology. Any transcriptional errors that result from  this process are unintentional.

## 2015-03-06 LAB — CBC
HEMATOCRIT: 25.2 % — AB (ref 35.0–47.0)
HEMOGLOBIN: 8 g/dL — AB (ref 12.0–16.0)
MCH: 26 pg (ref 26.0–34.0)
MCHC: 31.7 g/dL — ABNORMAL LOW (ref 32.0–36.0)
MCV: 81.9 fL (ref 80.0–100.0)
Platelets: 304 10*3/uL (ref 150–440)
RBC: 3.08 MIL/uL — ABNORMAL LOW (ref 3.80–5.20)
RDW: 15.7 % — AB (ref 11.5–14.5)
WBC: 4.7 10*3/uL (ref 3.6–11.0)

## 2015-03-06 LAB — BASIC METABOLIC PANEL
ANION GAP: 9 (ref 5–15)
BUN: 18 mg/dL (ref 6–20)
CO2: 27 mmol/L (ref 22–32)
Calcium: 6.6 mg/dL — ABNORMAL LOW (ref 8.9–10.3)
Chloride: 104 mmol/L (ref 101–111)
Creatinine, Ser: 2.25 mg/dL — ABNORMAL HIGH (ref 0.44–1.00)
GFR, EST AFRICAN AMERICAN: 25 mL/min — AB (ref >60)
GFR, EST NON AFRICAN AMERICAN: 22 mL/min — AB (ref >60)
Glucose, Bld: 177 mg/dL — ABNORMAL HIGH (ref 65–99)
POTASSIUM: 3.5 mmol/L (ref 3.5–5.1)
SODIUM: 140 mmol/L (ref 135–145)

## 2015-03-06 LAB — GLUCOSE, CAPILLARY
GLUCOSE-CAPILLARY: 142 mg/dL — AB (ref 65–99)
GLUCOSE-CAPILLARY: 136 mg/dL — AB (ref 65–99)
Glucose-Capillary: 125 mg/dL — ABNORMAL HIGH (ref 65–99)
Glucose-Capillary: 138 mg/dL — ABNORMAL HIGH (ref 65–99)

## 2015-03-06 MED ORDER — CALCIUM CARBONATE ANTACID 500 MG PO CHEW
3.0000 | CHEWABLE_TABLET | Freq: Three times a day (TID) | ORAL | Status: AC
  Administered 2015-03-06 (×2): 600 mg via ORAL
  Filled 2015-03-06 (×2): qty 3

## 2015-03-06 NOTE — Progress Notes (Addendum)
[place] at [place] NAME: [name]    MR#:  [id number]  DATE OF BIRTH:  June 12, 1950  Subjective:  Patient is doing well this point per patient reports no pain and no acute issues.  REVIEW OF SYSTEMS:    Review of Systems  Constitutional: Negative for fever and chills.  HENT: Negative for sore throat.   Eyes: Negative for blurred vision, double vision and pain.  Respiratory: Negative for cough, hemoptysis, shortness of breath and wheezing.   Cardiovascular: Negative for chest pain, palpitations, orthopnea and leg swelling.  Gastrointestinal: Negative for heartburn, nausea, vomiting, abdominal pain, diarrhea and constipation.  Genitourinary: Negative for dysuria and hematuria.  Musculoskeletal: Negative for back pain and joint pain.  Skin: Negative for rash.  Neurological: Negative for sensory change, speech change, focal weakness and headaches.  Endo/Heme/Allergies: Does not bruise/bleed easily.  Psychiatric/Behavioral: Negative for depression. The patient is not nervous/anxious.       DRUG ALLERGIES:   Allergies  Allergen Reactions  . Buspirone Other (See Comments)    Weakness  . Citalopram Other (See Comments)  . Lisinopril Cough  . Metformin Diarrhea    At 1000 mg dose  . Pravastatin Other (See Comments)    insomnia  . Sitagliptin Other (See Comments)    constipation  . Tramadol Itching  . Diltiazem Hcl Palpitations  . Gabapentin Palpitations  . Lovastatin Palpitations    VITALS:  Blood pressure 159/75, pulse 62, temperature 98 F (36.7 C), temperature source Oral, resp. rate 16, height 5\' 3"  (1.6 m), weight 108.863 kg (240 lb), SpO2 98 %.  PHYSICAL EXAMINATION:   Physical Exam  Constitutional: She is oriented to person, place, and time and well-developed, well-nourished, and in no distress. No distress.  HENT:  Head: Normocephalic.  Eyes: No scleral icterus.  Neck: Normal range of motion. Neck supple.  No JVD present. No tracheal deviation present.  Cardiovascular: Normal rate and regular rhythm.  Exam reveals no gallop and no friction rub.   Murmur heard. Pulmonary/Chest: Effort normal and breath sounds normal. No respiratory distress. She has no wheezes. She has no rales. She exhibits no tenderness.  Abdominal: Soft. Bowel sounds are normal. She exhibits no distension and no mass. There is no tenderness. There is no rebound and no guarding.  Wound is bandaged. Staples are clean and intact.  Musculoskeletal: Normal range of motion. She exhibits no edema.  Neurological: She is alert and oriented to person, place, and time.  Skin: Skin is warm. No rash noted. No erythema.  Psychiatric: Affect and judgment normal.      LABORATORY PANEL:   CBC  Recent Labs Lab 03/06/15 0527  WBC 4.7  HGB 8.0*  HCT 25.2*  PLT 304   ------------------------------------------------------------------------------------------------------------------  Chemistries   Recent Labs Lab 03/06/15 0527  NA 140  K 3.5  CL 104  CO2 27  GLUCOSE 177*  BUN 18  CREATININE 2.25*  CALCIUM 6.6*   ------------------------------------------------------------------------------------------------------------------  Cardiac Enzymes No results for input(s): TROPONINI in the last 168 hours. ------------------------------------------------------------------------------------------------------------------  RADIOLOGY:  No results found.   ASSESSMENT AND PLAN:   IMPRESSION AND PLAN: Patient is a 65 year old African-American female admitted with wound infection  1. 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   2. Diabetes type 2: Patient has been started on a carb modified diet. As per diabetes recommendations do not start Amaryl while she is  inpatient. Continue sliding scale insulin.  3. Hypertension patient is on metoprolol and  Avapro and hydralazine.  I cannot increase metoprolol due to the fact the patient's heart rates are right around 60. Continue hydralazine 50 mg by mouth 3 times a day. Patient's blood pressures are maintained. Would not make adjustments at this time.  4. Hypothyroidism continue with Synthroid  5. Hyperlipidemia continue atorvastatin  6. Acute on chronic Anemia : This is likely due to debridement and surgery.Transfuse if < 7  7. Chronic renal failure stage [id number] Creatinine baseline is 2-2.2. Her creatinine is 2.25 this morning hold nephrotoxic agents. If creatinine is increasing then consult nephrology.   8. Atrial fibrillation/SVT: Patient is on amiodarone and digoxin. She has been seen by [facility] cardiology in the past.  9. Hypocalcemia: This is due to low albumin and underlying chronic disease. Pharmacy is assisting with hypocalcemia replacement. Calcium level 6.6 this morning. Last albumin 2.2 on January 11  Management plans discussed with patient and she is in agreement DVT Prophylaxis: SCDs  TOTAL TIME TAKING CARE OF THIS PATIENT: 20 minutes.    I will be back on Friday to see patient. Please do not hesitate to call hospitalists if you have any issues between now and then.   Pariss Hommes M.D on 03/06/2015 at 10:38 AM  Between 7am to 6pm - Pager - [phone]  After 6pm go to [email or web address] - password [email or web address] [place] [facility]  Office  [phone]  CC: Primary care physician; Adin Hector, MD    Note: This dictation was prepared with Dragon dictation along with smaller phrase technology. Any transcriptional errors that result from this process are unintentional.

## 2015-03-06 NOTE — Care Management Important Message (Signed)
Important Message  Patient Details  Name: [name] MRN: [id number] Date of Birth: 04-01-1950   Medicare Important Message Given:  Yes    Beverly Sessions, RN 03/06/2015, 3:56 PM

## 2015-03-06 NOTE — Progress Notes (Signed)
3 Days Post-Op   Subjective:  Patient without complaint. Patient states that she has been tolerating a diet without any nausea, vomiting. She states that the wound has been changed now approximately every 4 hours for the last 24 hours. She has continued to improve.  Vital signs in last 24 hours: Temp:  [97.4 F (36.3 C)-98.2 F (36.8 C)] 97.4 F (36.3 C) (01/16 2024) Pulse Rate:  [57-64] 57 (01/16 2024) Resp:  [16-20] 17 (01/16 2024) BP: (130-163)/(72-83) 130/78 mmHg (01/16 2024) SpO2:  [97 %-98 %] 98 % (01/16 2024) Last BM Date: 03/06/15  Intake/Output from previous day: 01/15 0701 - 01/16 0700 In: 9 [IV Piggyback:50] Out: 35 [Urine:200; Drains:3]  GI: Wound is inspected. Colostomy drain in place draining stool in the bag. Wet Kerlix dressing in place around drain. JP is discharged however having difficulty rolling seal once the bandage is removed. Seal improve once bandage was replaced. No spreading erythema no evidence of purulence within the wound.  Lab Results:  CBC  Recent Labs  03/05/15 0705 03/06/15 0527  WBC 5.3 4.7  HGB 8.6* 8.0*  HCT 27.5* 25.2*  PLT 325 304   CMP     Component Value Date/Time   NA 140 03/06/2015 0527   NA 143 07/19/2013 0523   K 3.5 03/06/2015 0527   K 3.7 07/19/2013 0523   CL 104 03/06/2015 0527   CL 109* 07/19/2013 0523   CO2 27 03/06/2015 0527   CO2 29 07/19/2013 0523   GLUCOSE 177* 03/06/2015 0527   GLUCOSE 110* 07/19/2013 0523   BUN 18 03/06/2015 0527   BUN 8 07/19/2013 0523   CREATININE 2.25* 03/06/2015 0527   CREATININE 1.58* 07/19/2013 0523   CALCIUM 6.6* 03/06/2015 0527   CALCIUM 7.8* 07/19/2013 0523   PROT 6.4* 02/22/2015 0414   PROT 5.4* 07/19/2013 0523   ALBUMIN 2.2* 03/01/2015 0702   ALBUMIN 2.1* 07/19/2013 0523   AST 14* 02/22/2015 0414   AST 16 07/19/2013 0523   ALT 13* 02/22/2015 0414   ALT 7* 07/19/2013 0523   ALKPHOS 86 02/22/2015 0414   ALKPHOS 66 07/19/2013 0523   BILITOT 0.7 02/22/2015 0414   BILITOT  0.3 07/19/2013 0523   GFRNONAA 22* 03/06/2015 0527   GFRNONAA 34* 07/19/2013 0523   GFRAA 25* 03/06/2015 0527   GFRAA 40* 07/19/2013 0523   PT/INR No results for input(s): LABPROT, INR in the last 72 hours.  Studies/Results: No results found.  Assessment/Plan: 65 year old female with a complex wound around her left upper quadrant end colostomy. Appears to be improving. Plan to continue current dressing change regimen. Not quite ready to progress to large colostomy bag at this time. May yet require additional trip to the operating room in the next few days. Given the patient showing no evidence of systemic illness, plan to discontinue IV antibiotics at this time.   Clayburn Pert, MD FACS General Surgeon  03/06/2015

## 2015-03-06 NOTE — Progress Notes (Signed)
MEDICATION RELATED CONSULT NOTE - INITIAL   Pharmacy Consult for Calcium supplementation Indication: hypocalcemia  Allergies  Allergen Reactions  . Buspirone Other (See Comments)    Weakness  . Citalopram Other (See Comments)  . Lisinopril Cough  . Metformin Diarrhea    At 1000 mg dose  . Pravastatin Other (See Comments)    insomnia  . Sitagliptin Other (See Comments)    constipation  . Tramadol Itching  . Diltiazem Hcl Palpitations  . Gabapentin Palpitations  . Lovastatin Palpitations    Patient Measurements: Height: 5\' 3"  (160 cm) Weight: 240 lb (108.863 kg) IBW/kg (Calculated) : 52.4 Adjusted Body Weight: na  Vital Signs: Temp: 98 F (36.7 C) (01/16 0511) Temp Source: Oral (01/16 0511) BP: 159/75 mmHg (01/16 0511) Pulse Rate: 62 (01/16 0511) Intake/Output from previous day: 01/15 0701 - 01/16 0700 In: 50 [IV Piggyback:50] Out: 203 [Urine:200; Drains:3] Intake/Output from this shift:    Labs:  Recent Labs  03/05/15 0705 03/06/15 0527  WBC 5.3 4.7  HGB 8.6* 8.0*  HCT 27.5* 25.2*  PLT 325 304  CREATININE 2.01* 2.25*   Estimated Creatinine Clearance: 29.5 mL/min (by C-G formula based on Cr of 2.25).   Microbiology: Recent Results (from the past 720 hour(s))  Culture, blood (Routine X 2) w Reflex to ID Panel     Status: None   Collection Time: 02/09/15  5:34 PM  Result Value Ref Range Status   Specimen Description BLOOD LEFT ASSIST CONTROL  Final   Special Requests BOTTLES DRAWN AEROBIC AND ANAEROBIC 1CC  Final   Culture NO GROWTH 6 DAYS  Final   Report Status 02/15/2015 FINAL  Final  Culture, blood (Routine X 2) w Reflex to ID Panel     Status: None   Collection Time: 02/09/15  5:35 PM  Result Value Ref Range Status   Specimen Description BLOOD RIGHT HAND  Final   Special Requests BOTTLES DRAWN AEROBIC AND ANAEROBIC  1CC  Final   Culture NO GROWTH 6 DAYS  Final   Report Status 02/15/2015 FINAL  Final  MRSA PCR Screening     Status: Abnormal   Collection Time: 02/09/15  5:56 PM  Result Value Ref Range Status   MRSA by PCR POSITIVE (A) NEGATIVE Final    Comment: CRITICAL RESULT CALLED TO, READ BACK BY AND VERIFIED WITH: DEL HOPKINS @ 1936 ON 02/09/2015 BY CAF        The GeneXpert MRSA Assay (FDA approved for NASAL specimens only), is one component of a comprehensive MRSA colonization surveillance program. It is not intended to diagnose MRSA infection nor to guide or monitor treatment for MRSA infections.   Culture, blood (routine x 2)     Status: None   Collection Time: 02/21/15 11:16 AM  Result Value Ref Range Status   Specimen Description BLOOD LEFT ARM  Final   Special Requests BOTTLES DRAWN AEROBIC AND ANAEROBIC 3CC  Final   Culture NO GROWTH 5 DAYS  Final   Report Status 02/26/2015 FINAL  Final  Culture, blood (routine x 2)     Status: None   Collection Time: 02/21/15 11:16 AM  Result Value Ref Range Status   Specimen Description BLOOD RIGHT ASSIST CONTROL  Final   Special Requests BOTTLES DRAWN AEROBIC AND ANAEROBIC 2CC  Final   Culture NO GROWTH 5 DAYS  Final   Report Status 02/26/2015 FINAL  Final  Urine culture     Status: None   Collection Time: 02/21/15 11:16 AM  Result Value  Ref Range Status   Specimen Description URINE, RANDOM  Final   Special Requests NONE  Final   Culture NO GROWTH < 24 HOURS  Final   Report Status 02/22/2015 FINAL  Final  MRSA PCR Screening     Status: None   Collection Time: 02/22/15 10:13 AM  Result Value Ref Range Status   MRSA by PCR NEGATIVE NEGATIVE Final    Comment:        The GeneXpert MRSA Assay (FDA approved for NASAL specimens only), is one component of a comprehensive MRSA colonization surveillance program. It is not intended to diagnose MRSA infection nor to guide or monitor treatment for MRSA infections.     Medical History: Past Medical History  Diagnosis Date  . Diabetes mellitus without complication ([place])   . GI bleed   . Aortic valve stenosis   .  Hyperlipidemia   . Hypertension   . Diverticulitis large intestine   . Obesity   . Shortness of breath dyspnea   . Hyperthyroidism   . Anxiety   . Arthritis   . Neuropathy in diabetes ([place])   . Anemia   . Vertigo   . Bowel obstruction (H[facility])     Medications:  Scheduled:  . amiodarone  200 mg Oral Daily  . atorvastatin  40 mg Oral QHS  . calcium carbonate  3 tablet Oral TID  . digoxin  0.0625 mg Oral Daily  . DULoxetine  30 mg Oral Daily  . hydrALAZINE  50 mg Oral 3 times per day  . insulin aspart  0-9 Units Subcutaneous TID WC  . irbesartan  300 mg Oral Daily  . levothyroxine  88 mcg Oral BH-q7a  . metoprolol succinate  50 mg Oral BID  . pantoprazole  40 mg Oral QHS  . piperacillin-tazobactam (ZOSYN)  IV  3.375 g Intravenous Q8H    Assessment: Patient with hypocalcemia, pharmacy consulted for replacement.  Calcium: 6.6 Corrected Calcium: 8.04  Goal of Therapy:  Normalization of calcium level  Plan:  Will replace with Calcium Carbonate (Tums) 600mg  elemental calcium tid for 2 doses today. Will recheck calcium and albumin with am labs.  Paulina Fusi, PharmD, BCPS 03/06/2015 12:16 PM

## 2015-03-06 NOTE — Progress Notes (Signed)
Nutrition Follow-up      INTERVENTION:  Meals and snacks: cater to pt preferences. Discussed foods high in protein Medical Nutrition Supplement Therapy: will add mightyshake BID for added nutrition Coordination of care: Will ask nursing to document intake as pt on isolation  NUTRITION DIAGNOSIS:   Increased nutrient needs related to wound healing as evidenced by estimated needs.    GOAL:   Patient will meet greater than or equal to 90% of their needs    MONITOR:    (Energy intake, Digestive system)  REASON FOR ASSESSMENT:   LOS    ASSESSMENT:     Current Nutrition: Pt reports ate cereal and coffee this am for breakfast.  Eating 6 inch sub from [facility] for lunch but has taken her all afternoon to eat it.  Reports typically eating few bites on dinner trays.  Limited documentation     Scheduled Medications:  . amiodarone  200 mg Oral Daily  . atorvastatin  40 mg Oral QHS  . calcium carbonate  3 tablet Oral TID  . digoxin  0.0625 mg Oral Daily  . DULoxetine  30 mg Oral Daily  . hydrALAZINE  50 mg Oral 3 times per day  . insulin aspart  0-9 Units Subcutaneous TID WC  . irbesartan  300 mg Oral Daily  . levothyroxine  88 mcg Oral BH-q7a  . metoprolol succinate  50 mg Oral BID  . pantoprazole  40 mg Oral QHS  . piperacillin-tazobactam (ZOSYN)  IV  3.375 g Intravenous Q8H      Electrolyte/Renal Profile and Glucose Profile:   Recent Labs Lab 03/03/15 0419 03/05/15 0705 03/06/15 0527  NA 140 138 140  K 3.8 3.6 3.5  CL 104 105 104  CO2 27 27 27   BUN 17 16 18   CREATININE 1.99* 2.01* 2.25*  CALCIUM 6.6* 6.5* 6.6*  GLUCOSE 117* 137* 177*   Protein Profile:  Recent Labs Lab 03/01/15 0702  ALBUMIN 2.2*    Gastrointestinal Profile: Last BM:1/16     Diet Order:  Diet regular Room service appropriate?: Yes; Fluid consistency:: Thin  Skin:   reviewed  :     Height:   Ht Readings from Last 1 Encounters:  03/03/15 5\' 3"  (1.6 m)    Weight:    Wt Readings from Last 1 Encounters:  03/03/15 240 lb (108.863 kg)    Ideal Body Weight:     BMI:  Body mass index is 42.52 kg/(m^2).  Estimated Nutritional Needs:   Kcal:  BEE 1034 kcals (IF 1.0-1.2, AF 1.3)  [id number] kcals/d Using IBW of 52kg   Protein:  (1.2-1.5 g/kg) 62-78 g/d  Fluid:  (25-27ml/kg) 1300-1516ml/d  EDUCATION NEEDS:   No education needs identified at this time  LOW Care Level  Monti Jilek B. Zenia Resides, [place], [place] (pager) Weekend/On-Call pager [phone])

## 2015-03-07 LAB — GLUCOSE, CAPILLARY
GLUCOSE-CAPILLARY: 127 mg/dL — AB (ref 65–99)
Glucose-Capillary: 145 mg/dL — ABNORMAL HIGH (ref 65–99)
Glucose-Capillary: 150 mg/dL — ABNORMAL HIGH (ref 65–99)
Glucose-Capillary: 176 mg/dL — ABNORMAL HIGH (ref 65–99)

## 2015-03-07 LAB — ALBUMIN: ALBUMIN: 2.2 g/dL — AB (ref 3.5–5.0)

## 2015-03-07 LAB — CALCIUM: CALCIUM: 6.5 mg/dL — AB (ref 8.9–10.3)

## 2015-03-07 MED ORDER — CALCIUM CARBONATE ANTACID 500 MG PO CHEW
3.0000 | CHEWABLE_TABLET | Freq: Three times a day (TID) | ORAL | Status: DC
  Administered 2015-03-07 – 2015-03-08 (×2): 600 mg via ORAL
  Filled 2015-03-07 (×2): qty 3

## 2015-03-07 MED ORDER — CHLORHEXIDINE GLUCONATE CLOTH 2 % EX PADS
6.0000 | MEDICATED_PAD | Freq: Every day | CUTANEOUS | Status: AC
  Administered 2015-03-07 – 2015-03-11 (×5): 6 via TOPICAL

## 2015-03-07 MED ORDER — MUPIROCIN 2 % EX OINT
1.0000 "application " | TOPICAL_OINTMENT | Freq: Two times a day (BID) | CUTANEOUS | Status: DC
  Administered 2015-03-07 – 2015-03-10 (×7): 1 via NASAL
  Filled 2015-03-07 (×2): qty 22

## 2015-03-07 NOTE — Progress Notes (Signed)
RN notified Dr. Adonis Huguenin, concerning pt.'s colostomy drain and JP drain. Colostomy drain had fallen out of the colostomy area when RN entered the room and JP was not staying compressed to suction. Dr. Adonis Huguenin told RN to "clean colostomy area and insert catheter back into the colostomy area and to hook up JP drain to wall suction on low contiunous suction."  Will continue to monitor pt.  Angus Seller

## 2015-03-07 NOTE — Evaluation (Signed)
Physical Therapy Evaluation Patient Details Name: [name] MRN: [id number] DOB: January 19, 1951 Today's Date: 03/07/2015   History of Present Illness  Pt admitted for foul drainage from wound site and now with infection in abdominal wound on L upper abdomen. Pt with stay complicated by multiple cleaning and I&D on 02/26/15 and 03/03/15. Pt now with JP drain to wall suction. Recent stay from 12/17-12/28 with dc to [facility]. Previous abdominal surgery significant for parastomal hernia. Hx includes DM, GI bleed, HTN, and anxiety.  Clinical Impression  Pt is a pleasant 65 year old female who was admitted for infection of wound site. Pt demonstrates all bed mobility/transfers/ambulation at baseline level. Pt reports this is her usual distance at home and has support at home if needed. Pt with no history of falls and can use rollater if needed. Pt does not require any further PT needs at this time. Pt will be dc in house and does not require follow up. RN aware. Will dc current orders.      Follow Up Recommendations No PT follow up    Equipment Recommendations  None recommended by PT    Recommendations for Other Services       Precautions / Restrictions Precautions Precautions: Fall Restrictions Weight Bearing Restrictions: No      Mobility  Bed Mobility Overal bed mobility: Independent Bed Mobility: Supine to Sit           General bed mobility comments: safe technique performed. No assistance required  Transfers Overall transfer level: Independent Equipment used: None Transfers: Sit to/from Stand           General transfer comment: safe technique performed without AD.   Ambulation/Gait Ambulation/Gait assistance: Supervision Ambulation Distance (Feet): 45 Feet Assistive device: None Gait Pattern/deviations: Step-to pattern     General Gait Details: Pt ambulates in room with safe technique, able to navigate obstacles safely. Pt does hold onto furniture, would benefit  from use of SPC in home environment. No LOB noted during ambulation. No fatigue noted  Stairs            Wheelchair Mobility    Modified Rankin (Stroke Patients Only)       Balance Overall balance assessment: Modified Independent Sitting-balance support: Feet supported Sitting balance-Leahy Scale: Normal       Standing balance-Leahy Scale: Good                               Pertinent Vitals/Pain Pain Assessment: No/denies pain    Home Living Family/patient expects to be discharged to:: Private residence Living Arrangements: Children (daughter and grandson) Available Help at Discharge: Family Type of Home: Apartment Home Access: Level entry     Home Layout: One level Home Equipment: Environmental consultant - 4 wheels;Cane - single point;Shower seat      Prior Function Level of Independence: Independent         Comments: Intermittent use of spc for community ambulation     Hand Dominance        Extremity/Trunk Assessment   Upper Extremity Assessment: Overall WFL for tasks assessed           Lower Extremity Assessment: Overall WFL for tasks assessed         Communication   Communication: No difficulties  Cognition Arousal/Alertness: Awake/alert Behavior During Therapy: WFL for tasks assessed/performed Overall Cognitive Status: Within Functional Limits for tasks assessed  General Comments      Exercises        Assessment/Plan    PT Assessment Patent does not need any further PT services  PT Diagnosis     PT Problem List    PT Treatment Interventions     PT Goals (Current goals can be found in the Care Plan section) Acute Rehab PT Goals Patient Stated Goal: to go home PT Goal Formulation: With patient Time For Goal Achievement: 2015-04-02 Potential to Achieve Goals: Good    Frequency     Barriers to discharge        Co-evaluation               End of Session   Activity Tolerance: Patient  tolerated treatment well Patient left: in chair;with chair alarm set Nurse Communication: Mobility status         Time: [id number] PT Time Calculation (min) (ACUTE ONLY): 20 min   Charges:   PT Evaluation $PT Eval Moderate Complexity: 1 Procedure     PT G Codes:        Suhan Paci 2015-04-02, 11:40 AM  Greggory Stallion, PT, DPT [phone]

## 2015-03-07 NOTE — Progress Notes (Signed)
4 Days Post-Op   Subjective: Patient's tube became dislodged from her ostomy again overnight. Entire wound was soiled. JP now requiring continuous suction to wall suction to maintain a seal. Wounds biliary dressed.  Vital signs in last 24 hours: Temp:  [97.4 F (36.3 C)-98.1 F (36.7 C)] 97.4 F (36.3 C) (01/16 2024) Pulse Rate:  [57-96] 96 (01/16 2316) Resp:  [17] 17 (01/16 2024) BP: (130-159)/(72-96) 138/85 mmHg (01/17 0519) SpO2:  [98 %] 98 % (01/16 2024) Last BM Date: 03/06/15  Intake/Output from previous day: 01/16 0701 - 01/17 0700 In: 600 [P.O.:600] Out: 25 [Drains:5; Stool:20]  GI: Dressing taking down this morning which again showed stool throughout the entirety of the wound. The stool was removed at the bedside. No spreading erythema or evidence of superficial infection.  Lab Results:  CBC  Recent Labs  03/05/15 0705 03/06/15 0527  WBC 5.3 4.7  HGB 8.6* 8.0*  HCT 27.5* 25.2*  PLT 325 304   CMP     Component Value Date/Time   NA 140 03/06/2015 0527   NA 143 07/19/2013 0523   K 3.5 03/06/2015 0527   K 3.7 07/19/2013 0523   CL 104 03/06/2015 0527   CL 109* 07/19/2013 0523   CO2 27 03/06/2015 0527   CO2 29 07/19/2013 0523   GLUCOSE 177* 03/06/2015 0527   GLUCOSE 110* 07/19/2013 0523   BUN 18 03/06/2015 0527   BUN 8 07/19/2013 0523   CREATININE 2.25* 03/06/2015 0527   CREATININE 1.58* 07/19/2013 0523   CALCIUM 6.5* 03/07/2015 0516   CALCIUM 7.8* 07/19/2013 0523   PROT 6.4* 02/22/2015 0414   PROT 5.4* 07/19/2013 0523   ALBUMIN 2.2* 03/07/2015 0516   ALBUMIN 2.1* 07/19/2013 0523   AST 14* 02/22/2015 0414   AST 16 07/19/2013 0523   ALT 13* 02/22/2015 0414   ALT 7* 07/19/2013 0523   ALKPHOS 86 02/22/2015 0414   ALKPHOS 66 07/19/2013 0523   BILITOT 0.7 02/22/2015 0414   BILITOT 0.3 07/19/2013 0523   GFRNONAA 22* 03/06/2015 0527   GFRNONAA 34* 07/19/2013 0523   GFRAA 25* 03/06/2015 0527   GFRAA 40* 07/19/2013 0523   PT/INR No results for  input(s): LABPROT, INR in the last 72 hours.  Studies/Results: No results found.  Assessment/Plan: 65 year old female with a complex wound around her left upper quadrant ostomy site. Tube again replaced in the ostomy nurse at bedside was again shown how to do the dressing. Area was redressed with an additional wet-to-dry. 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 Continue current dressing plan of twice a day and as needed wet-to-dry dressing changes.   Clayburn Pert, MD FACS General Surgeon  03/07/2015

## 2015-03-07 NOTE — Progress Notes (Signed)
MEDICATION RELATED CONSULT NOTE - INITIAL   Pharmacy Consult for Calcium supplementation Indication: hypocalcemia  Allergies  Allergen Reactions  . Buspirone Other (See Comments)    Weakness  . Citalopram Other (See Comments)  . Lisinopril Cough  . Metformin Diarrhea    At 1000 mg dose  . Pravastatin Other (See Comments)    insomnia  . Sitagliptin Other (See Comments)    constipation  . Tramadol Itching  . Diltiazem Hcl Palpitations  . Gabapentin Palpitations  . Lovastatin Palpitations    Patient Measurements: Height: 5\' 3"  (160 cm) Weight: 240 lb (108.863 kg) IBW/kg (Calculated) : 52.4 Adjusted Body Weight: na  Vital Signs: Temp: 97.7 F (36.5 C) (01/17 1358) Temp Source: Oral (01/17 1358) BP: 140/100 mmHg (01/17 1358) Pulse Rate: 77 (01/17 1358) Intake/Output from previous day: 01/16 0701 - 01/17 0700 In: 600 [P.O.:600] Out: 25 [Drains:5; Stool:20] Intake/Output from this shift: Total I/O In: -  Out: 200 [Urine:200]  Labs:  Recent Labs  03/05/15 0705 03/06/15 0527 03/07/15 0516  WBC 5.3 4.7  --   HGB 8.6* 8.0*  --   HCT 27.5* 25.2*  --   PLT 325 304  --   CREATININE 2.01* 2.25*  --   ALBUMIN  --   --  2.2*   Estimated Creatinine Clearance: 29.5 mL/min (by C-G formula based on Cr of 2.25).   Microbiology: Recent Results (from the past 720 hour(s))  Culture, blood (Routine X 2) w Reflex to ID Panel     Status: None   Collection Time: 02/09/15  5:34 PM  Result Value Ref Range Status   Specimen Description BLOOD LEFT ASSIST CONTROL  Final   Special Requests BOTTLES DRAWN AEROBIC AND ANAEROBIC 1CC  Final   Culture NO GROWTH 6 DAYS  Final   Report Status 02/15/2015 FINAL  Final  Culture, blood (Routine X 2) w Reflex to ID Panel     Status: None   Collection Time: 02/09/15  5:35 PM  Result Value Ref Range Status   Specimen Description BLOOD RIGHT HAND  Final   Special Requests BOTTLES DRAWN AEROBIC AND ANAEROBIC  1CC  Final   Culture NO GROWTH 6  DAYS  Final   Report Status 02/15/2015 FINAL  Final  MRSA PCR Screening     Status: Abnormal   Collection Time: 02/09/15  5:56 PM  Result Value Ref Range Status   MRSA by PCR POSITIVE (A) NEGATIVE Final    Comment: CRITICAL RESULT CALLED TO, READ BACK BY AND VERIFIED WITH: DEL HOPKINS @ 1936 ON 02/09/2015 BY CAF        The GeneXpert MRSA Assay (FDA approved for NASAL specimens only), is one component of a comprehensive MRSA colonization surveillance program. It is not intended to diagnose MRSA infection nor to guide or monitor treatment for MRSA infections.   Culture, blood (routine x 2)     Status: None   Collection Time: 02/21/15 11:16 AM  Result Value Ref Range Status   Specimen Description BLOOD LEFT ARM  Final   Special Requests BOTTLES DRAWN AEROBIC AND ANAEROBIC 3CC  Final   Culture NO GROWTH 5 DAYS  Final   Report Status 02/26/2015 FINAL  Final  Culture, blood (routine x 2)     Status: None   Collection Time: 02/21/15 11:16 AM  Result Value Ref Range Status   Specimen Description BLOOD RIGHT ASSIST CONTROL  Final   Special Requests BOTTLES DRAWN AEROBIC AND ANAEROBIC 2CC  Final   Culture NO  GROWTH 5 DAYS  Final   Report Status 02/26/2015 FINAL  Final  Urine culture     Status: None   Collection Time: 02/21/15 11:16 AM  Result Value Ref Range Status   Specimen Description URINE, RANDOM  Final   Special Requests NONE  Final   Culture NO GROWTH < 24 HOURS  Final   Report Status 02/22/2015 FINAL  Final  MRSA PCR Screening     Status: None   Collection Time: 02/22/15 10:13 AM  Result Value Ref Range Status   MRSA by PCR NEGATIVE NEGATIVE Final    Comment:        The GeneXpert MRSA Assay (FDA approved for NASAL specimens only), is one component of a comprehensive MRSA colonization surveillance program. It is not intended to diagnose MRSA infection nor to guide or monitor treatment for MRSA infections.     Medical History: Past Medical History  Diagnosis  Date  . Diabetes mellitus without complication ([place])   . GI bleed   . Aortic valve stenosis   . Hyperlipidemia   . Hypertension   . Diverticulitis large intestine   . Obesity   . Shortness of breath dyspnea   . Hyperthyroidism   . Anxiety   . Arthritis   . Neuropathy in diabetes ([place])   . Anemia   . Vertigo   . Bowel obstruction ([facility])     Medications:  Scheduled:  . amiodarone  200 mg Oral Daily  . atorvastatin  40 mg Oral QHS  . calcium carbonate  3 tablet Oral TID WC  . Chlorhexidine Gluconate Cloth  6 each Topical Q0600  . digoxin  0.0625 mg Oral Daily  . DULoxetine  30 mg Oral Daily  . hydrALAZINE  50 mg Oral 3 times per day  . insulin aspart  0-9 Units Subcutaneous TID WC  . irbesartan  300 mg Oral Daily  . levothyroxine  88 mcg Oral BH-q7a  . metoprolol succinate  50 mg Oral BID  . mupirocin ointment  1 application Nasal BID  . pantoprazole  40 mg Oral QHS    Assessment: Patient with hypocalcemia, pharmacy consulted for replacement.  Calcium: 6.5 Corrected Calcium: 7.94  Goal of Therapy:  Normalization of calcium level  Plan:  Will replace with Calcium Carbonate (Tums) 600mg  elemental calcium tid. Will recheck calcium with am labs.  Paulina Fusi, PharmD, BCPS 03/07/2015 2:22 PM

## 2015-03-07 NOTE — Clinical Social Work Note (Signed)
CSW informed by RN that PT has assessed patient and stated she does not need rehab for mobility. CSW spoke with patient and she is happy about this. She did say that if something more simplified cannot be figured out for the care of her colostomy site, she may want to go to a facility for assistance in managing that. CSW awaiting to see what final plan will be for patient's ostomy site prior to resending patient's referral out to [facility] facilities. Shela Leff MSW,LCSW [phone]

## 2015-03-08 LAB — GLUCOSE, CAPILLARY
GLUCOSE-CAPILLARY: 141 mg/dL — AB (ref 65–99)
Glucose-Capillary: 126 mg/dL — ABNORMAL HIGH (ref 65–99)
Glucose-Capillary: 129 mg/dL — ABNORMAL HIGH (ref 65–99)
Glucose-Capillary: 202 mg/dL — ABNORMAL HIGH (ref 65–99)

## 2015-03-08 LAB — CALCIUM: Calcium: 7 mg/dL — ABNORMAL LOW (ref 8.9–10.3)

## 2015-03-08 MED ORDER — CALCIUM CARBONATE ANTACID 500 MG PO CHEW
1.0000 | CHEWABLE_TABLET | Freq: Three times a day (TID) | ORAL | Status: DC
  Administered 2015-03-08 – 2015-03-13 (×12): 200 mg via ORAL
  Filled 2015-03-08 (×12): qty 1

## 2015-03-08 MED ORDER — AMLODIPINE BESYLATE 5 MG PO TABS
2.5000 mg | ORAL_TABLET | Freq: Every day | ORAL | Status: DC
  Administered 2015-03-08 – 2015-03-13 (×6): 2.5 mg via ORAL
  Filled 2015-03-08 (×6): qty 1

## 2015-03-08 NOTE — Care Management Important Message (Signed)
Important Message  Patient Details  Name: [name] MRN: [id number] Date of Birth: 05-28-1950   Medicare Important Message Given:  Yes    Beverly Sessions, RN 03/08/2015, 1:43 PM

## 2015-03-08 NOTE — Progress Notes (Signed)
[place] [facility] [place] NAME: [name]    MR#:  [id number]  DATE OF BIRTH:  04-05-1950  Subjective:  Patient seen today as a follow up for medical management. Sugars are better. BP on the higher side. - has a drain from the ostomy site which is to continuous suction  and likely going to OR again tomorrow  REVIEW OF SYSTEMS:    Review of Systems  Constitutional: Negative for fever and chills.  HENT: Negative for sore throat.   Eyes: Negative for blurred vision, double vision and pain.  Respiratory: Negative for cough, hemoptysis, shortness of breath and wheezing.   Cardiovascular: Negative for chest pain, palpitations, orthopnea and leg swelling.  Gastrointestinal: Negative for heartburn, nausea, vomiting, abdominal pain, diarrhea and constipation.  Genitourinary: Negative for dysuria and hematuria.  Musculoskeletal: Negative for back pain and joint pain.  Skin: Negative for rash.  Neurological: Negative for sensory change, speech change, focal weakness and headaches.  Endo/Heme/Allergies: Does not bruise/bleed easily.  Psychiatric/Behavioral: Negative for depression. The patient is not nervous/anxious.       DRUG ALLERGIES:   Allergies  Allergen Reactions  . Buspirone Other (See Comments)    Weakness  . Citalopram Other (See Comments)  . Lisinopril Cough  . Metformin Diarrhea    At 1000 mg dose  . Pravastatin Other (See Comments)    insomnia  . Sitagliptin Other (See Comments)    constipation  . Tramadol Itching  . Diltiazem Hcl Palpitations  . Gabapentin Palpitations  . Lovastatin Palpitations    VITALS:  Blood pressure 144/66, pulse 74, temperature 98 F (36.7 C), temperature source Oral, resp. rate 18, height 5\' 3"  (1.6 m), weight 108.863 kg (240 lb), SpO2 96 %.  PHYSICAL EXAMINATION:   Physical Exam  Constitutional: She is oriented to person, place, and time and well-developed, well-nourished, and in no  distress. No distress.  HENT:  Head: Normocephalic.  Eyes: No scleral icterus.  Neck: Normal range of motion. Neck supple. No JVD present. No tracheal deviation present.  Cardiovascular: Normal rate and regular rhythm.  Exam reveals no gallop and no friction rub.   Murmur heard. Pulmonary/Chest: Effort normal and breath sounds normal. No respiratory distress. She has no wheezes. She has no rales. She exhibits no tenderness.  Abdominal: Soft. Bowel sounds are normal. She exhibits no distension and no mass. There is no tenderness. There is no rebound and no guarding.  Drain from ostomy site to continuous wall suction, stool around the drain noted. Midline abdominal incision healing  Musculoskeletal: Normal range of motion. She exhibits no edema.  Neurological: She is alert and oriented to person, place, and time.  Skin: Skin is warm. No rash noted. No erythema.  Psychiatric: Affect and judgment normal.      LABORATORY PANEL:   CBC  Recent Labs Lab 03/06/15 0527  WBC 4.7  HGB 8.0*  HCT 25.2*  PLT 304   ------------------------------------------------------------------------------------------------------------------  Chemistries   Recent Labs Lab 03/06/15 0527  03/08/15 0549  NA 140  --   --   K 3.5  --   --   CL 104  --   --   CO2 27  --   --   GLUCOSE 177*  --   --   BUN 18  --   --   CREATININE 2.25*  --   --   CALCIUM 6.6*  < > 7.0*  < > = values in this interval not  displayed. ------------------------------------------------------------------------------------------------------------------  Cardiac Enzymes No results for input(s): TROPONINI in the last 168 hours. ------------------------------------------------------------------------------------------------------------------  RADIOLOGY:  No results found.   ASSESSMENT AND PLAN:   IMPRESSION AND PLAN: Patient is a 65 year old African-American female admitted with wound infection  1. Abdominal wall  abscess with a complex wound to her left abdomen due to ostomy disruption: Management as per surgery. - to OR again tomorrow - for now has a drain from ostomy site- to continuous suction with minimal output  2. Diabetes type 2: Sugars havent been very elevated here. Though Hba1c is 9.2  As per diabetes recommendations do not start Amaryl while she is inpatient. Continue sliding scale insulin.  3. Hypertension : continue  metoprolol and Avapro and hydralazine.  - add low dose norvasc today  4. Hypothyroidism continue with Synthroid  5. Hyperlipidemia continue atorvastatin  6. Acute on chronic Anemia : This is likely due to debridement and surgery.Transfuse if hb < 7  7. Chronic renal failure stage [id number] Creatinine baseline is 2-2.2. Her creatinine is stable for now.  - hold nephrotoxic agents. If creatinine is increasing then consult nephrology.  8. Atrial fibrillation/SVT: Patient is on amiodarone and digoxin. She has been seen by [facility] cardiology in the past.  9. Hypocalcemia: This is due to low albumin and underlying chronic disease. Pharmacy is assisting with calcium replacement. On oral supplements  Management plans discussed with patient and she is in agreement DVT Prophylaxis: SCDs  TOTAL TIME TAKING CARE OF THIS PATIENT: 23 minutes.    Will see the patient as needed. Please do not hesitate to call hospitalists if you have any issues between now and then.   Gladstone Lighter M.D on 03/08/2015 at 1:39 PM  Between 7am to 6pm - Pager - [phone]  After 6pm go to [email or web address] - password [email or web address] [facility]  Office  [phone]  CC: Primary care physician; Adin Hector, MD    Note: This dictation was prepared with Dragon dictation along with smaller phrase technology. Any transcriptional errors that result from this process are unintentional.

## 2015-03-08 NOTE — Progress Notes (Signed)
Patient has no complaints this morning  Vital signs are stable  Obese abdomen soft nontender ostomy functional wound is closing laterally no erythema  Calves are nontender  Recommend return to the operating room tomorrow for debridement if necessary but likely can perform additional delayed primary closure and possibly fit an ostomy bag.  Risks and options delineated in detail

## 2015-03-08 NOTE — Progress Notes (Signed)
MEDICATION RELATED CONSULT NOTE - INITIAL   Pharmacy Consult for Calcium supplementation Indication: hypocalcemia  Allergies  Allergen Reactions  . Buspirone Other (See Comments)    Weakness  . Citalopram Other (See Comments)  . Lisinopril Cough  . Metformin Diarrhea    At 1000 mg dose  . Pravastatin Other (See Comments)    insomnia  . Sitagliptin Other (See Comments)    constipation  . Tramadol Itching  . Diltiazem Hcl Palpitations  . Gabapentin Palpitations  . Lovastatin Palpitations    Patient Measurements: Height: 5\' 3"  (160 cm) Weight: 240 lb (108.863 kg) IBW/kg (Calculated) : 52.4 Adjusted Body Weight: na  Vital Signs: BP: 159/81 mmHg (01/18 0602) Pulse Rate: 72 (01/18 0900) Intake/Output from previous day: 01/17 0701 - 01/18 0700 In: 0  Out: 1060 [Urine:1050; Drains:10] Intake/Output from this shift:    Labs:  Recent Labs  03/06/15 0527 03/07/15 0516  WBC 4.7  --   HGB 8.0*  --   HCT 25.2*  --   PLT 304  --   CREATININE 2.25*  --   ALBUMIN  --  2.2*   Estimated Creatinine Clearance: 29.5 mL/min (by C-G formula based on Cr of 2.25).   Microbiology: Recent Results (from the past 720 hour(s))  Culture, blood (Routine X 2) w Reflex to ID Panel     Status: None   Collection Time: 02/09/15  5:34 PM  Result Value Ref Range Status   Specimen Description BLOOD LEFT ASSIST CONTROL  Final   Special Requests BOTTLES DRAWN AEROBIC AND ANAEROBIC 1CC  Final   Culture NO GROWTH 6 DAYS  Final   Report Status 02/15/2015 FINAL  Final  Culture, blood (Routine X 2) w Reflex to ID Panel     Status: None   Collection Time: 02/09/15  5:35 PM  Result Value Ref Range Status   Specimen Description BLOOD RIGHT HAND  Final   Special Requests BOTTLES DRAWN AEROBIC AND ANAEROBIC  1CC  Final   Culture NO GROWTH 6 DAYS  Final   Report Status 02/15/2015 FINAL  Final  MRSA PCR Screening     Status: Abnormal   Collection Time: 02/09/15  5:56 PM  Result Value Ref Range  Status   MRSA by PCR POSITIVE (A) NEGATIVE Final    Comment: CRITICAL RESULT CALLED TO, READ BACK BY AND VERIFIED WITH: DEL HOPKINS @ 1936 ON 02/09/2015 BY CAF        The GeneXpert MRSA Assay (FDA approved for NASAL specimens only), is one component of a comprehensive MRSA colonization surveillance program. It is not intended to diagnose MRSA infection nor to guide or monitor treatment for MRSA infections.   Culture, blood (routine x 2)     Status: None   Collection Time: 02/21/15 11:16 AM  Result Value Ref Range Status   Specimen Description BLOOD LEFT ARM  Final   Special Requests BOTTLES DRAWN AEROBIC AND ANAEROBIC 3CC  Final   Culture NO GROWTH 5 DAYS  Final   Report Status 02/26/2015 FINAL  Final  Culture, blood (routine x 2)     Status: None   Collection Time: 02/21/15 11:16 AM  Result Value Ref Range Status   Specimen Description BLOOD RIGHT ASSIST CONTROL  Final   Special Requests BOTTLES DRAWN AEROBIC AND ANAEROBIC 2CC  Final   Culture NO GROWTH 5 DAYS  Final   Report Status 02/26/2015 FINAL  Final  Urine culture     Status: None   Collection Time: 02/21/15 11:16  AM  Result Value Ref Range Status   Specimen Description URINE, RANDOM  Final   Special Requests NONE  Final   Culture NO GROWTH < 24 HOURS  Final   Report Status 02/22/2015 FINAL  Final  MRSA PCR Screening     Status: None   Collection Time: 02/22/15 10:13 AM  Result Value Ref Range Status   MRSA by PCR NEGATIVE NEGATIVE Final    Comment:        The GeneXpert MRSA Assay (FDA approved for NASAL specimens only), is one component of a comprehensive MRSA colonization surveillance program. It is not intended to diagnose MRSA infection nor to guide or monitor treatment for MRSA infections.     Medical History: Past Medical History  Diagnosis Date  . Diabetes mellitus without complication ([place])   . GI bleed   . Aortic valve stenosis   . Hyperlipidemia   . Hypertension   . Diverticulitis large  intestine   . Obesity   . Shortness of breath dyspnea   . Hyperthyroidism   . Anxiety   . Arthritis   . Neuropathy in diabetes ([place])   . Anemia   . Vertigo   . Bowel obstruction ([facility])     Medications:  Scheduled:  . amiodarone  200 mg Oral Daily  . atorvastatin  40 mg Oral QHS  . calcium carbonate  3 tablet Oral TID WC  . Chlorhexidine Gluconate Cloth  6 each Topical Q0600  . digoxin  0.0625 mg Oral Daily  . DULoxetine  30 mg Oral Daily  . hydrALAZINE  50 mg Oral 3 times per day  . insulin aspart  0-9 Units Subcutaneous TID WC  . irbesartan  300 mg Oral Daily  . levothyroxine  88 mcg Oral BH-q7a  . metoprolol succinate  50 mg Oral BID  . mupirocin ointment  1 application Nasal BID  . pantoprazole  40 mg Oral QHS    Assessment: Patient with hypocalcemia, pharmacy consulted for replacement.  Calcium: 7.0 Corrected Calcium: 8.44  Goal of Therapy:  Normalization of calcium level  Plan:  Will decrease to  Calcium Carbonate (Tums) 200mg  elemental calcium tid. Will recheck calcium with am labs.  Paulina Fusi, PharmD, BCPS 03/08/2015 11:31 AM

## 2015-03-09 ENCOUNTER — Inpatient Hospital Stay: Payer: PPO | Admitting: Anesthesiology

## 2015-03-09 ENCOUNTER — Encounter
Admission: EM | Disposition: A | Discharge status: Home Health | Payer: Self-pay | Source: Home / Self Care | Attending: General Surgery

## 2015-03-09 DIAGNOSIS — L03311 Cellulitis of abdominal wall: Secondary | ICD-10-CM

## 2015-03-09 HISTORY — PX: WOUND DEBRIDEMENT: SHX247

## 2015-03-09 LAB — BASIC METABOLIC PANEL
ANION GAP: 8 (ref 5–15)
BUN: 17 mg/dL (ref 6–20)
CHLORIDE: 103 mmol/L (ref 101–111)
CO2: 28 mmol/L (ref 22–32)
CREATININE: 1.91 mg/dL — AB (ref 0.44–1.00)
Calcium: 7.2 mg/dL — ABNORMAL LOW (ref 8.9–10.3)
GFR calc non Af Amer: 26 mL/min — ABNORMAL LOW (ref >60)
GFR, EST AFRICAN AMERICAN: 31 mL/min — AB (ref >60)
Glucose, Bld: 144 mg/dL — ABNORMAL HIGH (ref 65–99)
Potassium: 3.5 mmol/L (ref 3.5–5.1)
SODIUM: 139 mmol/L (ref 135–145)

## 2015-03-09 LAB — GLUCOSE, CAPILLARY
GLUCOSE-CAPILLARY: 139 mg/dL — AB (ref 65–99)
GLUCOSE-CAPILLARY: 126 mg/dL — AB (ref 65–99)
GLUCOSE-CAPILLARY: 145 mg/dL — AB (ref 65–99)
Glucose-Capillary: 126 mg/dL — ABNORMAL HIGH (ref 65–99)
Glucose-Capillary: 145 mg/dL — ABNORMAL HIGH (ref 65–99)

## 2015-03-09 SURGERY — DEBRIDEMENT, WOUND, ABDOMEN
Anesthesia: Monitor Anesthesia Care | Site: Abdomen | Wound class: Dirty or Infected

## 2015-03-09 MED ORDER — FENTANYL CITRATE (PF) 100 MCG/2ML IJ SOLN: 25.0000 ug | INTRAMUSCULAR | Status: DC | PRN

## 2015-03-09 MED ORDER — SODIUM CHLORIDE 0.9 % IV SOLN
INTRAVENOUS | Status: DC | PRN
  Administered 2015-03-09: 11:00:00 via INTRAVENOUS

## 2015-03-09 MED ORDER — MIDAZOLAM HCL 2 MG/2ML IJ SOLN
INTRAMUSCULAR | Status: DC | PRN
  Administered 2015-03-09: 2 mg via INTRAVENOUS

## 2015-03-09 MED ORDER — LIDOCAINE HCL (PF) 1 % IJ SOLN
INTRAMUSCULAR | Status: DC | PRN
  Administered 2015-03-09: 24 mL

## 2015-03-09 MED ORDER — BACITRACIN ZINC 500 UNIT/GM EX OINT
TOPICAL_OINTMENT | CUTANEOUS | Status: DC | PRN
  Administered 2015-03-09: 1 via TOPICAL

## 2015-03-09 MED ORDER — OXYCODONE HCL 5 MG PO TABS: 5.0000 mg | ORAL_TABLET | Freq: Once | ORAL | Status: DC | PRN

## 2015-03-09 MED ORDER — LIDOCAINE HCL (PF) 1 % IJ SOLN
INTRAMUSCULAR | Status: AC
  Filled 2015-03-09: qty 30

## 2015-03-09 MED ORDER — PROPOFOL 10 MG/ML IV BOLUS
INTRAVENOUS | Status: DC | PRN
  Administered 2015-03-09: 20 mg via INTRAVENOUS
  Administered 2015-03-09: 30 mg via INTRAVENOUS

## 2015-03-09 MED ORDER — BACITRACIN ZINC 500 UNIT/GM EX OINT
TOPICAL_OINTMENT | CUTANEOUS | Status: AC
  Filled 2015-03-09: qty 28.35

## 2015-03-09 MED ORDER — OXYCODONE HCL 5 MG/5ML PO SOLN: 5.0000 mg | Freq: Once | ORAL | Status: DC | PRN

## 2015-03-09 SURGICAL SUPPLY — 19 items
ADHESIVE MASTISOL STRL (MISCELLANEOUS) ×3 IMPLANT
BARRIER SKIN 2 3/4 (OSTOMY) ×2 IMPLANT
BARRIER SKIN 2 3/4 INCH (OSTOMY) ×1
BULB RESERV EVAC DRAIN JP 100C (MISCELLANEOUS) ×3 IMPLANT
CANISTER SUCT 1200ML W/VALVE (MISCELLANEOUS) ×3 IMPLANT
ELECT REM PT RETURN 9FT ADLT (ELECTROSURGICAL) ×3
ELECTRODE REM PT RTRN 9FT ADLT (ELECTROSURGICAL) ×1 IMPLANT
GLOVE BIO SURGEON STRL SZ8 (GLOVE) ×15 IMPLANT
GOWN STRL REUS W/ TWL LRG LVL3 (GOWN DISPOSABLE) ×3 IMPLANT
GOWN STRL REUS W/TWL LRG LVL3 (GOWN DISPOSABLE) ×6
NEEDLE HYPO 22GX1.5 SAFETY (NEEDLE) ×3 IMPLANT
NS IRRIG 500ML POUR BTL (IV SOLUTION) ×3 IMPLANT
PACK BASIN MINOR ARMC (MISCELLANEOUS) ×3 IMPLANT
POUCH DRAIN 2 3/4 LARGE BLUE 1 (OSTOMY) ×3 IMPLANT
SOL PREP PVP 2OZ (MISCELLANEOUS) ×3
SOLUTION PREP PVP 2OZ (MISCELLANEOUS) ×1 IMPLANT
SPONGE LAP 18X18 5 PK (GAUZE/BANDAGES/DRESSINGS) ×3 IMPLANT
SUT PROLENE 0 CT 2 (SUTURE) ×9 IMPLANT
SYR 30ML LL (SYRINGE) ×3 IMPLANT

## 2015-03-09 NOTE — Progress Notes (Addendum)
MEDICATION RELATED CONSULT NOTE - Follow Up   Pharmacy Consult for Calcium supplementation Indication: hypocalcemia  Allergies  Allergen Reactions  . Buspirone Other (See Comments)    Weakness  . Citalopram Other (See Comments)  . Lisinopril Cough  . Metformin Diarrhea    At 1000 mg dose  . Pravastatin Other (See Comments)    insomnia  . Sitagliptin Other (See Comments)    constipation  . Tramadol Itching  . Diltiazem Hcl Palpitations  . Gabapentin Palpitations  . Lovastatin Palpitations    Patient Measurements: Height: 5\' 3"  (160 cm) Weight: 240 lb (108.863 kg) IBW/kg (Calculated) : 52.4 Adjusted Body Weight: na  Vital Signs: Temp: 97 F (36.1 C) (01/19 1153) Temp Source: Tympanic (01/19 1046) BP: 138/97 mmHg (01/19 1213) Pulse Rate: 57 (01/19 1213) Intake/Output from previous day: 01/18 0701 - 01/19 0700 In: 720 [P.O.:720] Out: 900 [Urine:900] Intake/Output from this shift: Total I/O In: 200 [I.V.:200] Out: 0   Labs:  Recent Labs  03/07/15 0516 03/09/15 0450  CREATININE  --  1.91*  ALBUMIN 2.2*  --    Estimated Creatinine Clearance: 34.8 mL/min (by C-G formula based on Cr of 1.91).   Microbiology: Recent Results (from the past 720 hour(s))  Culture, blood (Routine X 2) w Reflex to ID Panel     Status: None   Collection Time: 02/09/15  5:34 PM  Result Value Ref Range Status   Specimen Description BLOOD LEFT ASSIST CONTROL  Final   Special Requests BOTTLES DRAWN AEROBIC AND ANAEROBIC 1CC  Final   Culture NO GROWTH 6 DAYS  Final   Report Status 02/15/2015 FINAL  Final  Culture, blood (Routine X 2) w Reflex to ID Panel     Status: None   Collection Time: 02/09/15  5:35 PM  Result Value Ref Range Status   Specimen Description BLOOD RIGHT HAND  Final   Special Requests BOTTLES DRAWN AEROBIC AND ANAEROBIC  1CC  Final   Culture NO GROWTH 6 DAYS  Final   Report Status 02/15/2015 FINAL  Final  MRSA PCR Screening     Status: Abnormal   Collection Time:  02/09/15  5:56 PM  Result Value Ref Range Status   MRSA by PCR POSITIVE (A) NEGATIVE Final    Comment: CRITICAL RESULT CALLED TO, READ BACK BY AND VERIFIED WITH: DEL HOPKINS @ 1936 ON 02/09/2015 BY CAF        The GeneXpert MRSA Assay (FDA approved for NASAL specimens only), is one component of a comprehensive MRSA colonization surveillance program. It is not intended to diagnose MRSA infection nor to guide or monitor treatment for MRSA infections.   Culture, blood (routine x 2)     Status: None   Collection Time: 02/21/15 11:16 AM  Result Value Ref Range Status   Specimen Description BLOOD LEFT ARM  Final   Special Requests BOTTLES DRAWN AEROBIC AND ANAEROBIC 3CC  Final   Culture NO GROWTH 5 DAYS  Final   Report Status 02/26/2015 FINAL  Final  Culture, blood (routine x 2)     Status: None   Collection Time: 02/21/15 11:16 AM  Result Value Ref Range Status   Specimen Description BLOOD RIGHT ASSIST CONTROL  Final   Special Requests BOTTLES DRAWN AEROBIC AND ANAEROBIC 2CC  Final   Culture NO GROWTH 5 DAYS  Final   Report Status 02/26/2015 FINAL  Final  Urine culture     Status: None   Collection Time: 02/21/15 11:16 AM  Result Value Ref Range  Status   Specimen Description URINE, RANDOM  Final   Special Requests NONE  Final   Culture NO GROWTH < 24 HOURS  Final   Report Status 02/22/2015 FINAL  Final  MRSA PCR Screening     Status: None   Collection Time: 02/22/15 10:13 AM  Result Value Ref Range Status   MRSA by PCR NEGATIVE NEGATIVE Final    Comment:        The GeneXpert MRSA Assay (FDA approved for NASAL specimens only), is one component of a comprehensive MRSA colonization surveillance program. It is not intended to diagnose MRSA infection nor to guide or monitor treatment for MRSA infections.     Medical History: Past Medical History  Diagnosis Date  . Diabetes mellitus without complication ([place])   . GI bleed   . Aortic valve stenosis   . Hyperlipidemia    . Hypertension   . Diverticulitis large intestine   . Obesity   . Shortness of breath dyspnea   . Hyperthyroidism   . Anxiety   . Arthritis   . Neuropathy in diabetes ([place])   . Anemia   . Vertigo   . Bowel obstruction ([facility])     Medications:  Scheduled:  . [MAR Hold] amiodarone  200 mg Oral Daily  . [MAR Hold] amLODipine  2.5 mg Oral Daily  . [MAR Hold] atorvastatin  40 mg Oral QHS  . [MAR Hold] calcium carbonate  1 tablet Oral TID WC  . [MAR Hold] Chlorhexidine Gluconate Cloth  6 each Topical Q0600  . [MAR Hold] digoxin  0.0625 mg Oral Daily  . [MAR Hold] DULoxetine  30 mg Oral Daily  . [MAR Hold] hydrALAZINE  50 mg Oral 3 times per day  . [MAR Hold] insulin aspart  0-9 Units Subcutaneous TID WC  . [MAR Hold] irbesartan  300 mg Oral Daily  . [MAR Hold] levothyroxine  88 mcg Oral BH-q7a  . [MAR Hold] metoprolol succinate  50 mg Oral BID  . [MAR Hold] mupirocin ointment  1 application Nasal BID  . [MAR Hold] pantoprazole  40 mg Oral QHS    Assessment: Patient with hypocalcemia, pharmacy consulted for replacement.  Calcium: 7.2 Corrected Calcium: 8.6  Goal of Therapy:  Normalization of calcium level  Plan:  Will continue replacement with Calcium Carbonate (Tums)200 mg elemental calcium tid as level has been trending up.  Will recheck calcium with am labs.  Murrell Converse, PharmD Clinical Pharmacist 03/09/2015

## 2015-03-09 NOTE — Anesthesia Postprocedure Evaluation (Signed)
Anesthesia Post Note  Patient: [name]  Procedure(s) Performed: Procedure(s) (LRB): DEBRIDEMENT ABDOMINAL WOUND (N/A)  Patient location during evaluation: PACU Anesthesia Type: General Level of consciousness: awake and alert Pain management: pain level controlled Vital Signs Assessment: post-procedure vital signs reviewed and stable Respiratory status: spontaneous breathing, nonlabored ventilation, respiratory function stable and patient connected to nasal cannula oxygen Cardiovascular status: blood pressure returned to baseline and stable Postop Assessment: no signs of nausea or vomiting Anesthetic complications: no    Last Vitals:  Filed Vitals:   03/09/15 1230 03/09/15 1237  BP:  169/92  Pulse: 69 66  Temp: 36.4 C   Resp: 14 18    Last Pain:  Filed Vitals:   03/09/15 1237  PainSc: 0-No pain                 Precious Haws Piscitello

## 2015-03-09 NOTE — Progress Notes (Signed)
Sutures to left side of abd  And  Old staples to mid abd  Both dry and intact   Colostomy intact   No drainage

## 2015-03-09 NOTE — Anesthesia Preprocedure Evaluation (Addendum)
Anesthesia Evaluation  Patient identified by MRN, date of birth, ID band Patient awake    Reviewed: Allergy & Precautions, H&P , NPO status , Patient's Chart, lab work & pertinent test results  History of Anesthesia Complications Negative for: history of anesthetic complications  Airway Mallampati: III  TM Distance: >3 FB Neck ROM: limited    Dental no notable dental hx. (+) Poor Dentition, Chipped   Pulmonary shortness of breath, former smoker,    Pulmonary exam normal breath sounds clear to auscultation       Cardiovascular Exercise Tolerance: Poor hypertension, Pt. on medications and Pt. on home beta blockers (-) angina+ PND  (-) Past MI Normal cardiovascular exam+ Valvular Problems/Murmurs AS  Rhythm:regular Rate:Normal     Neuro/Psych PSYCHIATRIC DISORDERS Anxiety negative neurological ROS     GI/Hepatic negative GI ROS, Neg liver ROS,   Endo/Other  diabetes, Poorly Controlled, Type 2, Oral Hypoglycemic AgentsHyperthyroidism Morbid obesity  Renal/GU Renal disease  negative genitourinary   Musculoskeletal  (+) Arthritis ,   Abdominal   Peds  Hematology negative hematology ROS (+) anemia ,   Anesthesia Other Findings Past Medical History:   Diabetes mellitus without complication ([place])                 GI bleed                                                     Aortic valve stenosis                                        Hyperlipidemia                                               Hypertension                                                 Diverticulitis large intestine                               Obesity                                                      Shortness of breath dyspnea                                  Hyperthyroidism                                              Anxiety  Arthritis                                                   Neuropathy in diabetes ([place])                                 Anemia                                                       Vertigo                                                      Bowel obstruction [facility])                                     Past Surgical History:   COLECTOMY                                                     laparotomy closure of cecal perforation          05/09/2013     Comment:Dr. Marina Gravel   TONSILLECTOMY                                                 BACK SURGERY                                                    Comment:spur frmoved from lower back   CARDIAC CATHETERIZATION                                       ABDOMINAL HYSTERECTOMY                                        LAPAROTOMY                                      N/A 02/06/2015     Comment:Procedure: Laparotomy, reduction of               incarcerated parastomal hernia, repair of               parastomal hernia with mesh;  Surgeon: Elta Guadeloupe  Marina Gravel, MD;  Location: [facility] ORS;  Service:               General;  Laterality: N/A;   DEBRIDEMENT OF ABDOMINAL WALL ABSCESS           N/A 02/22/2015       Comment:Procedure: DEBRIDEMENT OF ABDOMINAL WALL               ABSCESS;  Surgeon: Clayburn Pert, MD;                Location: [facility] ORS;  Service: General;                Laterality: N/A;  BMI    Body Mass Index   42.52 kg/m 2      Reproductive/Obstetrics negative OB ROS                            Anesthesia Physical  Anesthesia Plan  ASA: IV  Anesthesia Plan: General and MAC   Post-op Pain Management:    Induction: Intravenous  Airway Management Planned: Simple Face Mask  Additional Equipment:   Intra-op Plan:   Post-operative Plan:   Informed Consent: I have reviewed the patients History and Physical, chart, labs and discussed the procedure including the risks, benefits and alternatives for the proposed anesthesia with the patient or authorized representative who has  indicated his/her understanding and acceptance.   Dental Advisory Given  Plan Discussed with: Anesthesiologist, CRNA and Surgeon  Anesthesia Plan Comments:        Anesthesia Quick Evaluation

## 2015-03-09 NOTE — Progress Notes (Signed)
A she was met in the preop holding area. We discussed the rationale and options to surgery and the goals of surgery were reviewed as well measures were answered for her she understood and agreed to proceed labs of been reviewed.

## 2015-03-09 NOTE — Op Note (Signed)
  02/21/2015 - 03/09/2015  11:57 AM  PATIENT:  [name]  65 y.o. female  PRE-OPERATIVE DIAGNOSIS:  Complex abdominal wound  POST-OPERATIVE DIAGNOSIS:  Same  PROCEDURE: Examination under anesthesia, rough debridement of granulation tissue, delayed partial primary closure of complex abdominal wound  SURGEON:  [name], FACS   ANESTHESIA:   local   Details of Procedure: This patient with complex abdominal wound or recurrent round a retracted previously necrotic and ischemic colostomy.  Patient was identified and prepped and draped in a sterile fashion and under monitored anesthesia care rough debridement utilizing only a laparotomy pad was performed over the granulation tissue around the retracted ostomy to remove fibrinopurulent exudate and promote blood flow. Once this was performed and further inspection under anesthesia was performed local anesthetic was infiltrated into the skin and subcutaneous tissues both lateral and medial. 0 Prolene figure-of-eight and horizontal mattress sutures were placed to loosely approximate both lateral and medial portion of the wound.  Further inspection following placement of the sutures indicated that a large ostomy appliance would fit over this wound (understanding that this may leak) and the ostomy was tailored to fit and placed with Mastisol on the wound and ostomy. An ostomy bag was placed.  The JP drain was trimmed and a new functional grenade suction canister was placed.  Patient hour this procedure well and workup location she was taken to recovery room in stable condition to be admitted for continued care   Florene Glen, MD FACS

## 2015-03-09 NOTE — Transfer of Care (Signed)
Immediate Anesthesia Transfer of Care Note  Patient: [name]  Procedure(s) Performed: Procedure(s): DEBRIDEMENT ABDOMINAL WOUND (N/A)  Patient Location: PACU  Anesthesia Type:General  Level of Consciousness: awake, alert , oriented and patient cooperative  Airway & Oxygen Therapy: Patient Spontanous Breathing and Patient connected to face mask oxygen  Post-op Assessment: Report given to RN, Post -op Vital signs reviewed and stable and Patient moving all extremities  Post vital signs: Reviewed and stable  Last Vitals:  Filed Vitals:   03/09/15 1046 03/09/15 1153  BP: 144/76 161/67  Pulse: 122 71  Temp: 36.6 C 36.1 C  Resp: 18 18    Complications: No apparent anesthesia complications

## 2015-03-10 LAB — GLUCOSE, CAPILLARY
GLUCOSE-CAPILLARY: 175 mg/dL — AB (ref 65–99)
GLUCOSE-CAPILLARY: 133 mg/dL — AB (ref 65–99)
Glucose-Capillary: 131 mg/dL — ABNORMAL HIGH (ref 65–99)
Glucose-Capillary: 131 mg/dL — ABNORMAL HIGH (ref 65–99)

## 2015-03-10 LAB — BASIC METABOLIC PANEL
ANION GAP: 8 (ref 5–15)
BUN: 15 mg/dL (ref 6–20)
CALCIUM: 6.9 mg/dL — AB (ref 8.9–10.3)
CO2: 26 mmol/L (ref 22–32)
Chloride: 101 mmol/L (ref 101–111)
Creatinine, Ser: 1.8 mg/dL — ABNORMAL HIGH (ref 0.44–1.00)
GFR, EST AFRICAN AMERICAN: 33 mL/min — AB (ref >60)
GFR, EST NON AFRICAN AMERICAN: 28 mL/min — AB (ref >60)
Glucose, Bld: 148 mg/dL — ABNORMAL HIGH (ref 65–99)
Potassium: 3.4 mmol/L — ABNORMAL LOW (ref 3.5–5.1)
SODIUM: 135 mmol/L (ref 135–145)

## 2015-03-10 MED ORDER — POTASSIUM CHLORIDE 20 MEQ/15ML (10%) PO SOLN
40.0000 meq | Freq: Once | ORAL | Status: AC
  Administered 2015-03-10: 40 meq via ORAL
  Filled 2015-03-10: qty 30

## 2015-03-10 NOTE — Progress Notes (Signed)
Her vital signs are stable and she has no fever at this time. She is complaining of some mild left lower quadrant pain primarily in the side of her ostomy. The wound was examined. The ostomy has significantly retracted portion it is visible appears healthy and functioning. She does have some erythema laterally but it is improved. The wound was redressed and irrigated.  She does have pain management issues but she does not appear to be in significant pain at the present time and I'm reluctant to make any changes in her pain regimen. We discussed this plan and she is in agreement

## 2015-03-10 NOTE — Progress Notes (Addendum)
[facility] at [place] NAME: [name]    MR#:  [id number]  DATE OF BIRTH:  [birth year]  Subjective:  Patient has no complaints this morning. Patient is doing well. Pain is controlled. Patient now has ostomy.  REVIEW OF SYSTEMS:    Review of Systems  Constitutional: Negative for fever and chills.  HENT: Negative for sore throat.   Eyes: Negative for blurred vision, double vision and pain.  Respiratory: Negative for cough, hemoptysis, shortness of breath and wheezing.   Cardiovascular: Negative for chest pain, palpitations, orthopnea and leg swelling.  Gastrointestinal: Negative for heartburn, nausea, vomiting, abdominal pain, diarrhea and constipation.  Genitourinary: Negative for dysuria and hematuria.  Musculoskeletal: Negative for back pain and joint pain.  Skin: Negative for rash.  Neurological: Negative for sensory change, speech change, focal weakness and headaches.  Endo/Heme/Allergies: Does not bruise/bleed easily.  Psychiatric/Behavioral: Negative for depression. The patient is not nervous/anxious.       DRUG ALLERGIES:   Allergies  Allergen Reactions  . Buspirone Other (See Comments)    Weakness  . Citalopram Other (See Comments)  . Lisinopril Cough  . Metformin Diarrhea    At 1000 mg dose  . Pravastatin Other (See Comments)    insomnia  . Sitagliptin Other (See Comments)    constipation  . Tramadol Itching  . Diltiazem Hcl Palpitations  . Gabapentin Palpitations  . Lovastatin Palpitations    VITALS:  Blood pressure 157/68, pulse 65, temperature 97.9 F (36.6 C), temperature source Oral, resp. rate 18, height 5\' 3"  (1.6 m), weight 108.863 kg (240 lb), SpO2 98 %.  PHYSICAL EXAMINATION:   Physical Exam  Constitutional: She is oriented to person, place, and time and well-developed, well-nourished, and in no distress. No distress.  HENT:  Head: Normocephalic.  Eyes: No scleral icterus.  Neck: Normal  range of motion. Neck supple. No JVD present. No tracheal deviation present.  Cardiovascular: Normal rate and regular rhythm.  Exam reveals no gallop and no friction rub.   Murmur heard. Pulmonary/Chest: Effort normal and breath sounds normal. No respiratory distress. She has no wheezes. She has no rales. She exhibits no tenderness.  Abdominal: Soft. Bowel sounds are normal. She exhibits no distension and no mass. There is no tenderness. There is no rebound and no guarding.  Patient now has ostomy. Sutures are placed and incision site looks clean and intact.  Musculoskeletal: Normal range of motion. She exhibits no edema.  Neurological: She is alert and oriented to person, place, and time.  Skin: Skin is warm. No rash noted. No erythema.  Psychiatric: Affect and judgment normal.      LABORATORY PANEL:   CBC  Recent Labs Lab 03/06/15 0527  WBC 4.7  HGB 8.0*  HCT 25.2*  PLT 304   ------------------------------------------------------------------------------------------------------------------  Chemistries   Recent Labs Lab 03/10/15 0621  NA 135  K 3.4*  CL 101  CO2 26  GLUCOSE 148*  BUN 15  CREATININE 1.80*  CALCIUM 6.9*   ------------------------------------------------------------------------------------------------------------------  Cardiac Enzymes No results for input(s): TROPONINI in the last 168 hours. ------------------------------------------------------------------------------------------------------------------  RADIOLOGY:  No results found.   ASSESSMENT AND PLAN:   IMPRESSION AND PLAN: Patient is a 65 year old African-American female admitted with wound infection  1. 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   2. Diabetes type 2: Patient has been started on a carb modified diet. As per diabetes  recommendations do not start Amaryl while she is inpatient. Continue  sliding scale insulin.  3. Hypertension patient is on metoprolol, Norvasc and Avapro and hydralazine.  Continue current management.  4. Hypothyroidism continue with Synthroid  5. Hyperlipidemia continue atorvastatin  6. Acute on chronic Anemia : This is likely due to debridement and surgery.Transfuse if < 7  7. Chronic renal failure stage [id number]    8. Atrial fibrillation/SVT: Patient is on amiodarone and digoxin. She has been seen by [facility] cardiology in the past.  9. Hypocalcemia: This is due to low albumin and underlying chronic disease. Pharmacy is assisting with hypocalcemia replacement.  10. Hypokalemia: Potassium will be repleted and rechecked in a.m. Management plans discussed with patient and she is in agreement DVT Prophylaxis: SCDs  TOTAL TIME TAKING CARE OF THIS PATIENT: 25 minutes.      [name] M.D on 03/10/2015 at 12:48 PM  Between 7am to 6pm - Pager - [phone]  After 6pm go to [email or web address] - password [email or web address] [facility]  Office  [phone]  CC: Primary care physician; Alicia Hector, MD    Note: This dictation was prepared with Dragon dictation along with smaller phrase technology. Any transcriptional errors that result from this process are unintentional.

## 2015-03-10 NOTE — Care Management Important Message (Signed)
Important Message  Patient Details  Name: [name] MRN: [id number] Date of Birth: 1950/03/01   Medicare Important Message Given:  Yes    Beverly Sessions, RN 03/10/2015, 9:43 AM

## 2015-03-11 LAB — GLUCOSE, CAPILLARY
GLUCOSE-CAPILLARY: 137 mg/dL — AB (ref 65–99)
GLUCOSE-CAPILLARY: 140 mg/dL — AB (ref 65–99)
Glucose-Capillary: 130 mg/dL — ABNORMAL HIGH (ref 65–99)
Glucose-Capillary: 169 mg/dL — ABNORMAL HIGH (ref 65–99)

## 2015-03-11 LAB — BASIC METABOLIC PANEL
ANION GAP: 8 (ref 5–15)
BUN: 17 mg/dL (ref 6–20)
CHLORIDE: 105 mmol/L (ref 101–111)
CO2: 28 mmol/L (ref 22–32)
CREATININE: 1.88 mg/dL — AB (ref 0.44–1.00)
Calcium: 7.6 mg/dL — ABNORMAL LOW (ref 8.9–10.3)
GFR calc non Af Amer: 27 mL/min — ABNORMAL LOW (ref >60)
GFR, EST AFRICAN AMERICAN: 31 mL/min — AB (ref >60)
Glucose, Bld: 146 mg/dL — ABNORMAL HIGH (ref 65–99)
POTASSIUM: 3.8 mmol/L (ref 3.5–5.1)
Sodium: 141 mmol/L (ref 135–145)

## 2015-03-11 MED ORDER — OXYCODONE-ACETAMINOPHEN 5-325 MG PO TABS: 1.0000 | ORAL_TABLET | Freq: Four times a day (QID) | ORAL | Status: DC | PRN

## 2015-03-11 NOTE — Progress Notes (Signed)
2 Days Post-Op   Subjective:  Patient without any acute events overnight. Her closed suction drain that was in her right upper quadrant fell out. Continues tolerated diet and has been up out of bed. Her ostomy continues to have function with a dressing now popped up to an ostomy bag.  Vital signs in last 24 hours: Temp:  [97.9 F (36.6 C)-98.1 F (36.7 C)] 98 F (36.7 C) (01/21 0604) Pulse Rate:  [64-76] 76 (01/21 0908) Resp:  [16-20] 20 (01/21 0604) BP: (142-164)/(68-90) 164/69 mmHg (01/21 0908) SpO2:  [97 %-99 %] 97 % (01/21 0604) Last BM Date: 03/10/15  Intake/Output from previous day: 01/20 0701 - 01/21 0700 In: 100 [P.O.:100] Out: 22 [Urine:2; Stool:20]  GI: Patient has a complex wound in her left upper quadrant around her ostomy. Closed section appears to be intact without any spreading erythema or purulent drainage. Ostomy bag in place over the recessed ostomy site.  Lab Results:  CBC No results for input(s): WBC, HGB, HCT, PLT in the last 72 hours. CMP     Component Value Date/Time   NA 141 03/11/2015 0438   NA 143 07/19/2013 0523   K 3.8 03/11/2015 0438   K 3.7 07/19/2013 0523   CL 105 03/11/2015 0438   CL 109* 07/19/2013 0523   CO2 28 03/11/2015 0438   CO2 29 07/19/2013 0523   GLUCOSE 146* 03/11/2015 0438   GLUCOSE 110* 07/19/2013 0523   BUN 17 03/11/2015 0438   BUN 8 07/19/2013 0523   CREATININE 1.88* 03/11/2015 0438   CREATININE 1.58* 07/19/2013 0523   CALCIUM 7.6* 03/11/2015 0438   CALCIUM 7.8* 07/19/2013 0523   PROT 6.4* 02/22/2015 0414   PROT 5.4* 07/19/2013 0523   ALBUMIN 2.2* 03/07/2015 0516   ALBUMIN 2.1* 07/19/2013 0523   AST 14* 02/22/2015 0414   AST 16 07/19/2013 0523   ALT 13* 02/22/2015 0414   ALT 7* 07/19/2013 0523   ALKPHOS 86 02/22/2015 0414   ALKPHOS 66 07/19/2013 0523   BILITOT 0.7 02/22/2015 0414   BILITOT 0.3 07/19/2013 0523   GFRNONAA 27* 03/11/2015 0438   GFRNONAA 34* 07/19/2013 0523   GFRAA 31* 03/11/2015 0438   GFRAA 40*  07/19/2013 0523   PT/INR No results for input(s): LABPROT, INR in the last 72 hours.  Studies/Results: No results found.  Assessment/Plan: 65 year old female with a complex wound that surrounds her left upper quadrant ostomy site. Continue local wound care. Currently using very large ostomy bag for fecal control. If this continues to control her fecal stream and she has continued healing of her wound she could possibly be discharged home with this wound care plan in the next couple of days.   Clayburn Pert, MD FACS General Surgeon  03/11/2015

## 2015-03-11 NOTE — Progress Notes (Signed)
Received report from night shift nurse that pt was acting confused.  Assesed pt this am pt was alert and oriented.  8:30am pt called stating someone was in her room that she did not know and asked if I could come help. Went in to check on pt. Pt was very delirious. Pt stated "I heard someone calling my name twice" and "there are people in the bathroom" she also said that there were 2 boys in her room.   There were no additional people in the room. Pt was reassured that no other people were present in the room.  Notified Dr. Adonis Huguenin and Dr. Benjie Karvonen of pt behavior. Pt requested pain medication, tylenol was given. Pt VSS and blood sugar. Pt continued to have hallucinations throughout the day. Reorient and continue to assess.

## 2015-03-11 NOTE — Progress Notes (Signed)
[place] at [place] NAME: [name]    MR#:  [id number]  DATE OF BIRTH:  30-Dec-1950  Subjective:  Patient has no issues overnight.  REVIEW OF SYSTEMS:    Review of Systems  Constitutional: Negative for fever and chills.  HENT: Negative for sore throat.   Eyes: Negative for blurred vision, double vision and pain.  Respiratory: Negative for cough, hemoptysis, shortness of breath and wheezing.   Cardiovascular: Negative for chest pain, palpitations, orthopnea and leg swelling.  Gastrointestinal: Negative for heartburn, nausea, vomiting, abdominal pain, diarrhea and constipation.  Genitourinary: Negative for dysuria and hematuria.  Musculoskeletal: Negative for back pain and joint pain.  Skin: Negative for rash.  Neurological: Negative for sensory change, speech change, focal weakness and headaches.  Endo/Heme/Allergies: Does not bruise/bleed easily.  Psychiatric/Behavioral: Negative for depression. The patient is not nervous/anxious.       DRUG ALLERGIES:   Allergies  Allergen Reactions  . Buspirone Other (See Comments)    Weakness  . Citalopram Other (See Comments)  . Lisinopril Cough  . Metformin Diarrhea    At 1000 mg dose  . Pravastatin Other (See Comments)    insomnia  . Sitagliptin Other (See Comments)    constipation  . Tramadol Itching  . Diltiazem Hcl Palpitations  . Gabapentin Palpitations  . Lovastatin Palpitations    VITALS:  Blood pressure 164/69, pulse 76, temperature 98 F (36.7 C), temperature source Oral, resp. rate 20, height 5\' 3"  (1.6 m), weight 108.863 kg (240 lb), SpO2 97 %.  PHYSICAL EXAMINATION:   Physical Exam  Constitutional: She is oriented to person, place, and time and well-developed, well-nourished, and in no distress. No distress.  HENT:  Head: Normocephalic.  Eyes: No scleral icterus.  Neck: Normal range of motion. Neck supple. No JVD present. No tracheal deviation present.   Cardiovascular: Normal rate and regular rhythm.  Exam reveals no gallop and no friction rub.   Murmur heard. Pulmonary/Chest: Effort normal and breath sounds normal. No respiratory distress. She has no wheezes. She has no rales. She exhibits no tenderness.  Abdominal: Soft. Bowel sounds are normal. She exhibits no distension and no mass. There is no tenderness. There is no rebound and no guarding.  Patient now has ostomy.   Musculoskeletal: Normal range of motion. She exhibits no edema.  Neurological: She is alert and oriented to person, place, and time.  Skin: Skin is warm. No rash noted. No erythema.  Psychiatric: Affect and judgment normal.      LABORATORY PANEL:   CBC  Recent Labs Lab 03/06/15 0527  WBC 4.7  HGB 8.0*  HCT 25.2*  PLT 304   ------------------------------------------------------------------------------------------------------------------  Chemistries   Recent Labs Lab 03/11/15 0438  NA 141  K 3.8  CL 105  CO2 28  GLUCOSE 146*  BUN 17  CREATININE 1.88*  CALCIUM 7.6*   ------------------------------------------------------------------------------------------------------------------  Cardiac Enzymes No results for input(s): TROPONINI in the last 168 hours. ------------------------------------------------------------------------------------------------------------------  RADIOLOGY:  No results found.   ASSESSMENT AND PLAN:   IMPRESSION AND PLAN: Patient is a 65 year old African-American female admitted with wound infection  1. 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   2. Diabetes type 2: Patient has been started on a carb modified diet. As per diabetes recommendations do not start Amaryl while she is inpatient. Continue sliding scale insulin.  3. Hypertension patient is on metoprolol, Norvasc  and Avapro and hydralazine.  Continue current management.  4.  Hypothyroidism continue with Synthroid  5. Hyperlipidemia continue atorvastatin  6. Acute on chronic Anemia : This is likely due to debridement and surgery.Transfuse if < 7 recheck CBC in a.m.  7. Chronic renal failure stage [id number]    8. Atrial fibrillation/SVT: Patient is on amiodarone and digoxin. She has been seen by [facility] cardiology in the past.  9. Hypocalcemia: This is due to low albumin and underlying chronic disease. Pharmacy is assisting with hypocalcemia replacement.   Management plans discussed with patient and she is in agreement DVT Prophylaxis: SCDs  TOTAL TIME TAKING CARE OF THIS PATIENT: 15 minutes.      Camar Guyton M.D on 03/11/2015 at 10:24 AM  Between 7am to 6pm - Pager - [phone]  After 6pm go to [email or web address] - password [email or web address] [facility]  Office  [phone]  CC: Primary care physician; Adin Hector, MD    Note: This dictation was prepared with Dragon dictation along with smaller phrase technology. Any transcriptional errors that result from this process are unintentional.

## 2015-03-11 NOTE — Progress Notes (Signed)
Pt called me to her room and noted that she accidentally pulled the JP drain out whilst going to the BR. Area slightly red and swollen as before, no drainage from opening except for a yellow/white piece of tissue hanging out the opening. I tried to clean it off but it felt attached to the inside so I left it. MD notified.

## 2015-03-12 LAB — CBC
HCT: 33.3 % — ABNORMAL LOW (ref 35.0–47.0)
Hemoglobin: 10.4 g/dL — ABNORMAL LOW (ref 12.0–16.0)
MCH: 25.3 pg — AB (ref 26.0–34.0)
MCHC: 31.2 g/dL — ABNORMAL LOW (ref 32.0–36.0)
MCV: 80.9 fL (ref 80.0–100.0)
PLATELETS: 428 10*3/uL (ref 150–440)
RBC: 4.12 MIL/uL (ref 3.80–5.20)
RDW: 15.9 % — AB (ref 11.5–14.5)
WBC: 8.3 10*3/uL (ref 3.6–11.0)

## 2015-03-12 LAB — BASIC METABOLIC PANEL
Anion gap: 10 (ref 5–15)
BUN: 15 mg/dL (ref 6–20)
CALCIUM: 8.1 mg/dL — AB (ref 8.9–10.3)
CO2: 26 mmol/L (ref 22–32)
Chloride: 105 mmol/L (ref 101–111)
Creatinine, Ser: 1.66 mg/dL — ABNORMAL HIGH (ref 0.44–1.00)
GFR calc Af Amer: 36 mL/min — ABNORMAL LOW (ref >60)
GFR, EST NON AFRICAN AMERICAN: 31 mL/min — AB (ref >60)
GLUCOSE: 159 mg/dL — AB (ref 65–99)
POTASSIUM: 3.4 mmol/L — AB (ref 3.5–5.1)
SODIUM: 141 mmol/L (ref 135–145)

## 2015-03-12 LAB — GLUCOSE, CAPILLARY
GLUCOSE-CAPILLARY: 144 mg/dL — AB (ref 65–99)
GLUCOSE-CAPILLARY: 149 mg/dL — AB (ref 65–99)
Glucose-Capillary: 129 mg/dL — ABNORMAL HIGH (ref 65–99)
Glucose-Capillary: 147 mg/dL — ABNORMAL HIGH (ref 65–99)

## 2015-03-12 MED ORDER — HYDRALAZINE HCL 20 MG/ML IJ SOLN: 10.0000 mg | INTRAMUSCULAR | Status: DC | PRN

## 2015-03-12 NOTE — Progress Notes (Signed)
[place] at [place] NAME: [name]    MR#:  [id number]  DATE OF BIRTH:  05/22/1950  Subjective:  Patient was having hallucinations yesterday. She denies history of hallucinating in the past. She is appropriately this morning  REVIEW OF SYSTEMS:    Review of Systems  Constitutional: Negative for fever and chills.  HENT: Negative for sore throat.   Eyes: Negative for blurred vision, double vision and pain.  Respiratory: Negative for cough, hemoptysis, shortness of breath and wheezing.   Cardiovascular: Negative for chest pain, palpitations, orthopnea and leg swelling.  Gastrointestinal: Negative for heartburn, nausea, vomiting, abdominal pain, diarrhea and constipation.  Genitourinary: Negative for dysuria and hematuria.  Musculoskeletal: Negative for back pain and joint pain.  Skin: Negative for rash.  Neurological: Negative for sensory change, speech change, focal weakness and headaches.  Endo/Heme/Allergies: Does not bruise/bleed easily.  Psychiatric/Behavioral: Negative for depression. The patient is not nervous/anxious.       DRUG ALLERGIES:   Allergies  Allergen Reactions  . Buspirone Other (See Comments)    Weakness  . Citalopram Other (See Comments)  . Lisinopril Cough  . Metformin Diarrhea    At 1000 mg dose  . Pravastatin Other (See Comments)    insomnia  . Sitagliptin Other (See Comments)    constipation  . Tramadol Itching  . Diltiazem Hcl Palpitations  . Gabapentin Palpitations  . Lovastatin Palpitations    VITALS:  Blood pressure 176/94, pulse 110, temperature 98 F (36.7 C), temperature source Oral, resp. rate 16, height 5\' 3"  (1.6 m), weight 108.863 kg (240 lb), SpO2 95 %.  PHYSICAL EXAMINATION:   Physical Exam  Constitutional: She is oriented to person, place, and time and well-developed, well-nourished, and in no distress. No distress.  HENT:  Head: Normocephalic.  Eyes: No scleral  icterus.  Neck: Normal range of motion. Neck supple. No JVD present. No tracheal deviation present.  Cardiovascular: Normal rate and regular rhythm.  Exam reveals no gallop and no friction rub.   Murmur heard. Pulmonary/Chest: Effort normal and breath sounds normal. No respiratory distress. She has no wheezes. She has no rales. She exhibits no tenderness.  Abdominal: Soft. Bowel sounds are normal. She exhibits no distension and no mass. There is no tenderness. There is no rebound and no guarding.  Patient now has ostomy.   Musculoskeletal: Normal range of motion. She exhibits no edema.  Neurological: She is alert and oriented to person, place, and time.  Skin: Skin is warm. No rash noted. No erythema.  Psychiatric: Affect and judgment normal.      LABORATORY PANEL:   CBC  Recent Labs Lab 03/12/15 0651  WBC 8.3  HGB 10.4*  HCT 33.3*  PLT 428   ------------------------------------------------------------------------------------------------------------------  Chemistries   Recent Labs Lab 03/12/15 0651  NA 141  K 3.4*  CL 105  CO2 26  GLUCOSE 159*  BUN 15  CREATININE 1.66*  CALCIUM 8.1*   ------------------------------------------------------------------------------------------------------------------  Cardiac Enzymes No results for input(s): TROPONINI in the last 168 hours. ------------------------------------------------------------------------------------------------------------------  RADIOLOGY:  No results found.   ASSESSMENT AND PLAN:   IMPRESSION AND PLAN: Patient is a 65 year old African-American female admitted with wound infection  1. 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   2. Diabetes type 2: Patient has been started on a carb modified diet. As per diabetes recommendations do not start Amaryl while she is  inpatient. Continue sliding scale insulin.  3.  Hypertension patient is on metoprolol, Norvasc and Avapro and hydralazine.  Continue current management.  4. Hypothyroidism continue with Synthroid  5. Hyperlipidemia continue atorvastatin  6. Acute on chronic Anemia : This is likely due to debridement and surgery.Transfuse if < 7.  7. Chronic renal failure stage IV: Creatinine has actually improved better than baseline.    8. Atrial fibrillation/SVT: Patient is on amiodarone and digoxin. She has been seen by [facility] cardiology in the past.  9. Hypocalcemia: This is due to low albumin and underlying chronic disease. Pharmacy is assisting with hypocalcemia replacement. Calcium is 8.1 this morning. 10. Hallucinations: I suspect this is due to prolonged hospitalization and pain medications. We will need to use pain medications judiciously. Continue to monitor.   Management plans discussed with patient and she is in agreement DVT Prophylaxis: SCDs  TOTAL TIME TAKING CARE OF THIS PATIENT: 25 minutes.      Kelechi Orgeron M.D on 03/12/2015 at 9:52 AM  Between 7am to 6pm - Pager - [phone]  After 6pm go to [email or web address] - password [email or web address] [facility]  Office  [phone]  CC: Primary care physician; Adin Hector, MD    Note: This dictation was prepared with Dragon dictation along with smaller phrase technology. Any transcriptional errors that result from this process are unintentional.

## 2015-03-13 ENCOUNTER — Encounter: Payer: Self-pay | Admitting: Surgery

## 2015-03-13 LAB — GLUCOSE, CAPILLARY
GLUCOSE-CAPILLARY: 163 mg/dL — AB (ref 65–99)
GLUCOSE-CAPILLARY: 151 mg/dL — AB (ref 65–99)
Glucose-Capillary: 140 mg/dL — ABNORMAL HIGH (ref 65–99)
Glucose-Capillary: 185 mg/dL — ABNORMAL HIGH (ref 65–99)

## 2015-03-13 LAB — MRSA PCR SCREENING: MRSA by PCR: NEGATIVE

## 2015-03-13 MED ORDER — ENSURE ENLIVE PO LIQD
237.0000 mL | Freq: Two times a day (BID) | ORAL | Status: DC
  Administered 2015-03-13 (×2): 237 mL via ORAL

## 2015-03-13 MED ORDER — OXYCODONE-ACETAMINOPHEN 5-325 MG PO TABS: 1.0000 | ORAL_TABLET | Freq: Four times a day (QID) | ORAL | Status: DC | PRN

## 2015-03-13 NOTE — Progress Notes (Signed)
CC: Abdominal wound Subjective: Patient had an uneventful night. She states she is not having any pain and is tolerating a regular diet. Having ostomy function. Continues to feel tired and sleeping a lot.  Objective: Vital signs in last 24 hours: Temp:  [97.8 F (36.6 C)-98.5 F (36.9 C)] 98.5 F (36.9 C) (01/23 0505) Pulse Rate:  [56-134] 56 (01/23 0505) Resp:  [17-19] 18 (01/23 0505) BP: (140-184)/(82-117) 165/85 mmHg (01/23 0505) SpO2:  [96 %-98 %] 97 % (01/23 0505) Last BM Date: 03/13/15  Intake/Output from previous day: 01/22 0701 - 01/23 0700 In: 120 [P.O.:120] Out: -  Intake/Output this shift:    Physical exam:  Gen.: No acute distress Chest: Clear to auscultation Heart: Regular rate and rhythm Abdomen: Soft, nontender, nondistended. Dressing in place to the left upper quadrant ostomy site. Ostomy having function of the ostomy bag. The lateral closure of the prior abscess wound is well approximated without active drainage, erythema, separation.  Lab Results: CBC   Recent Labs  03/12/15 0651  WBC 8.3  HGB 10.4*  HCT 33.3*  PLT 428   BMET  Recent Labs  03/11/15 0438 03/12/15 0651  NA 141 141  K 3.8 3.4*  CL 105 105  CO2 28 26  GLUCOSE 146* 159*  BUN 17 15  CREATININE 1.88* 1.66*  CALCIUM 7.6* 8.1*   PT/INR No results for input(s): LABPROT, INR in the last 72 hours. ABG No results for input(s): PHART, HCO3 in the last 72 hours.  Invalid input(s): PCO2, PO2  Studies/Results: No results found.  Anti-infectives: Anti-infectives    Start     Dose/Rate Route Frequency Ordered Stop   02/21/15 1900  piperacillin-tazobactam (ZOSYN) IVPB 3.375 g  Status:  Discontinued     3.375 g 12.5 mL/hr over 240 Minutes Intravenous 3 times per day 02/21/15 1453 02/21/15 1504   02/21/15 1700  piperacillin-tazobactam (ZOSYN) IVPB 3.375 g  Status:  Discontinued     3.375 g 12.5 mL/hr over 240 Minutes Intravenous Every 8 hours 02/21/15 1504 03/06/15 2114   02/21/15 1045  vancomycin (VANCOCIN) 1,500 mg in sodium chloride 0.9 % 500 mL IVPB     1,500 mg 250 mL/hr over 120 Minutes Intravenous  Once 02/21/15 1021 03/03/15 2055   02/21/15 1030  piperacillin-tazobactam (ZOSYN) IVPB 3.375 g     3.375 g 12.5 mL/hr over 240 Minutes Intravenous  Once 02/21/15 1021 02/21/15 1151      Assessment/Plan:  65 year old female with a complex wound surrounding her left upper quadrant ostomy site. Doing well. She will need to continue with local wound care. Plan for discharge home with home health in the next 24-48 hours.  [name] T. Adonis Huguenin, MD, FACS  03/13/2015

## 2015-03-13 NOTE — Final Progress Note (Signed)
4 Days Post-Op   Subjective:  Patient doing well without any complaints.  Vital signs in last 24 hours: Temp:  [97.7 F (36.5 C)-98.5 F (36.9 C)] 97.7 F (36.5 C) (01/23 1225) Pulse Rate:  [56-105] 64 (01/23 1225) Resp:  [18-19] 18 (01/23 1225) BP: (156-184)/(82-96) 167/91 mmHg (01/23 1225) SpO2:  [94 %-97 %] 94 % (01/23 1225) Last BM Date: 03/13/15  Intake/Output from previous day: 01/22 0701 - 01/23 0700 In: 120 [P.O.:120] Out: -   GI: Wound to left upper quadrant around her retracted ostomy. Current packing includes 2 simple dry gauze dressings packed in the ostomy site has been changed daily. There is an abd pad covering the lateral aspect of the closed wound. 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  Lab Results:  CBC  Recent Labs  03/12/15 0651  WBC 8.3  HGB 10.4*  HCT 33.3*  PLT 428   CMP     Component Value Date/Time   NA 141 03/12/2015 0651   NA 143 07/19/2013 0523   K 3.4* 03/12/2015 0651   K 3.7 07/19/2013 0523   CL 105 03/12/2015 0651   CL 109* 07/19/2013 0523   CO2 26 03/12/2015 0651   CO2 29 07/19/2013 0523   GLUCOSE 159* 03/12/2015 0651   GLUCOSE 110* 07/19/2013 0523   BUN 15 03/12/2015 0651   BUN 8 07/19/2013 0523   CREATININE 1.66* 03/12/2015 0651   CREATININE 1.58* 07/19/2013 0523   CALCIUM 8.1* 03/12/2015 0651   CALCIUM 7.8* 07/19/2013 0523   PROT 6.4* 02/22/2015 0414   PROT 5.4* 07/19/2013 0523   ALBUMIN 2.2* 03/07/2015 0516   ALBUMIN 2.1* 07/19/2013 0523   AST 14* 02/22/2015 0414   AST 16 07/19/2013 0523   ALT 13* 02/22/2015 0414   ALT 7* 07/19/2013 0523   ALKPHOS 86 02/22/2015 0414   ALKPHOS 66 07/19/2013 0523   BILITOT 0.7 02/22/2015 0414   BILITOT 0.3 07/19/2013 0523   GFRNONAA 31* 03/12/2015 0651   GFRNONAA 34* 07/19/2013 0523   GFRAA 36* 03/12/2015 0651   GFRAA 40* 07/19/2013 0523   PT/INR No results for input(s): LABPROT, INR in the last 72  hours.  Studies/Results: No results found.  Assessment/Plan: 65 year old female status post multiple debridements of her complex left upper quadrant peri-Ostomy wound. Stable for discharge today. We'll need daily dressing changes of plain gauze into the cavity surrounding the ostomy site but not occluding the retracted ostomy. Currently the wound holds to plain gauze pads. The lateral wound is controlled with a single AVD pad and the majority of the stool output is collected with a large ostomy appliance. We will set up for home health to continue with her wound care. We'll see back in clinic next week.   Clayburn Pert, MD FACS General Surgeon  03/13/2015

## 2015-03-13 NOTE — Care Management (Signed)
Confirmed with [facility] that they are able to accept the patient and start services 1/25.  Bedside RN to instruct patient on dressing changes prior to discharge.

## 2015-03-13 NOTE — Clinical Social Work Note (Signed)
Patient able to discharge to home today. RN CM making arrangements for home health. Shela Leff MSW,LCSW [phone]

## 2015-03-13 NOTE — Care Management Important Message (Signed)
Important Message  Patient Details  Name: [name] MRN: [id number] Date of Birth: 1950/08/16   Medicare Important Message Given:  Yes    [name] A Jadda Hunsucker 03/13/2015, 11:21 AM

## 2015-03-13 NOTE — Discharge Summary (Signed)
Patient ID: [name] MRN: [id number] DOB/AGE: 1950/03/15 66 y.o.  Admit date: 02/21/2015 Discharge date: 03/13/2015  Discharge Diagnoses:  Abdominal Wall Wound  Procedures Performed: Incision and Drainage of large abdominal wall wound  Discharged Condition: good  Hospital Course: Admitted for a large abdominal wall wound around her ostomy site. Wound debrided and serially closed until able to be dressed with an ostomy appliance.   Discharge Orders: Discharge home  Disposition: Home  Discharge Medications:    Medication List    TAKE these medications        amiodarone 200 MG tablet  Commonly known as:  PACERONE  Take 1 tablet (200 mg total) by mouth daily.     atorvastatin 40 MG tablet  Commonly known as:  LIPITOR  Take 40 mg by mouth at bedtime.     bisacodyl 5 MG EC tablet  Commonly known as:  DULCOLAX  Take 4 tablets (20 mg total) by mouth once. Please see surgery prep instructions     calcium carbonate 500 MG chewable tablet  Commonly known as:  TUMS  Chew 1 tablet (200 mg of elemental calcium total) by mouth daily.     clobetasol cream 0.05 %  Commonly known as:  TEMOVATE  Apply 1 application topically 2 (two) times daily as needed (for rash).     Digoxin 62.5 MCG Tabs  Take 0.0625 mg by mouth daily.     DULoxetine 30 MG capsule  Commonly known as:  CYMBALTA  Take 30 mg by mouth daily.     glimepiride 4 MG tablet  Commonly known as:  AMARYL  Take 4 mg by mouth 2 (two) times daily.     levothyroxine 88 MCG tablet  Commonly known as:  SYNTHROID, LEVOTHROID  Take 88 mcg by mouth every morning.     metoprolol succinate 100 MG 24 hr tablet  Commonly known as:  TOPROL-XL  Take 1 tablet (100 mg total) by mouth 2 (two) times daily.     nystatin 100000 UNIT/GM Powd  Apply topically daily. Under abdominal pannuses     oxyCODONE-acetaminophen 5-325 MG tablet  Commonly known as:  PERCOCET/ROXICET  Take 1 tablet by mouth every 6 (six) hours as needed  for moderate pain (for pain).     polyethylene glycol powder powder  Commonly known as:  GLYCOLAX/MIRALAX  Take 255 g by mouth once. Please see surgery prep instructions given at appointment     telmisartan 80 MG tablet  Commonly known as:  MICARDIS  Take 80 mg by mouth daily.        Follwup: Follow-up Information    Follow up with [facility]. Schedule an appointment as soon as possible for a visit in 1 week.   Specialty:  General Surgery   Why:  For wound re-check   Contact information:   [place] [phone]      Signed: Clayburn Pert 03/13/2015, 2:53 PM

## 2015-03-13 NOTE — Discharge Instructions (Signed)
Notify MD for any worsening redness, swelling, bleeding or drainage from the incision sites, fever of 100.5 or higher or pain that is not relieved with medication, or if you start having problems with the colostomy.  Take all medications as prescribed.

## 2015-03-13 NOTE — Care Management (Addendum)
Patient to discharge today with home health services.  Patient obtains her medications from [facility] in [place].  Patient states that she has used [place] in the past and would like to have them again.  Patient will need home health nursing for dressing changes.  Home health order is in.  Awaiting return call from [place].  Patient lives at home with her daughter.  Daughter and her sister will provide patient with transportation at time of discharge.  Patient has a rolling walker at home.

## 2015-03-13 NOTE — Progress Notes (Signed)
Nutrition Follow-up     INTERVENTION:  Meals and snacks: Monitor intake Medical Nutrition Supplement Therapy: recommend Ensure Enlive po BID, each supplement provides 350 kcal and 20 grams of protein Coordination of care: Will ask for new wt.   NUTRITION DIAGNOSIS:   Increased nutrient needs related to wound healing as evidenced by estimated needs.    GOAL:   Patient will meet greater than or equal to 90% of their needs    MONITOR:    (Energy intake, Digestive system)  REASON FOR ASSESSMENT:   LOS    ASSESSMENT:     Pt with complex wound surrounding left upper quadrant ostomy.     Current Nutrition: pt reports eating 50% or less of meals.     Gastrointestinal Profile: Last BM: 1/23 ostomy output noted   Scheduled Medications:  . amiodarone  200 mg Oral Daily  . amLODipine  2.5 mg Oral Daily  . atorvastatin  40 mg Oral QHS  . calcium carbonate  1 tablet Oral TID WC  . digoxin  0.0625 mg Oral Daily  . DULoxetine  30 mg Oral Daily  . hydrALAZINE  50 mg Oral 3 times per day  . insulin aspart  0-9 Units Subcutaneous TID WC  . irbesartan  300 mg Oral Daily  . levothyroxine  88 mcg Oral BH-q7a  . metoprolol succinate  50 mg Oral BID  . pantoprazole  40 mg Oral QHS      Electrolyte/Renal Profile and Glucose Profile:   Recent Labs Lab 03/10/15 0621 03/11/15 0438 03/12/15 0651  NA 135 141 141  K 3.4* 3.8 3.4*  CL 101 105 105  CO2 26 28 26   BUN 15 17 15   CREATININE 1.80* 1.88* 1.66*  CALCIUM 6.9* 7.6* 8.1*  GLUCOSE 148* 146* 159*   Protein Profile:  Recent Labs Lab 03/07/15 0516  ALBUMIN 2.2*     Weight Trend since Admission: Filed Weights   02/21/15 1010 03/03/15 0853 03/09/15 1046  Weight: 240 lb (108.863 kg) 240 lb (108.863 kg) 240 lb (108.863 kg)      Diet Order:  Diet regular Room service appropriate?: Yes; Fluid consistency:: Thin  Skin:   reviewed   Height:   Ht Readings from Last 1 Encounters:  03/09/15 5\' 3"  (1.6 m)     Weight:   Wt Readings from Last 1 Encounters:  03/09/15 240 lb (108.863 kg)    Ideal Body Weight:     BMI:  Body mass index is 42.52 kg/(m^2).  Estimated Nutritional Needs:   Kcal:  BEE 1034 kcals (IF 1.0-1.2, AF 1.3)  [id number] kcals/d Using IBW of 52kg   Protein:  (1.2-1.5 g/kg) 62-78 g/d  Fluid:  (25-29ml/kg) 1300-158ml/d  EDUCATION NEEDS:   No education needs identified at this time  MODERATE Care Level  Cassian Torelli B. Zenia Resides, [place], [place] (pager) Weekend/On-Call pager [phone])

## 2015-03-13 NOTE — Progress Notes (Signed)
[facility] NAME: [name]    MR#:  [id number]  DATE OF BIRTH:  August 04, 1950  Subjective:  Patient is doing well this morning. She has no hallucinations. She is not in any pain.  REVIEW OF SYSTEMS:    Review of Systems  Constitutional: Negative for fever and chills.  HENT: Negative for sore throat.   Eyes: Negative for blurred vision, double vision and pain.  Respiratory: Negative for cough, hemoptysis, shortness of breath and wheezing.   Cardiovascular: Negative for chest pain, palpitations, orthopnea and leg swelling.  Gastrointestinal: Negative for heartburn, nausea, vomiting, abdominal pain, diarrhea and constipation.  Genitourinary: Negative for dysuria and hematuria.  Musculoskeletal: Negative for back pain and joint pain.  Skin: Negative for rash.  Neurological: Negative for sensory change, speech change, focal weakness and headaches.  Endo/Heme/Allergies: Does not bruise/bleed easily.  Psychiatric/Behavioral: Negative for depression. The patient is not nervous/anxious.       DRUG ALLERGIES:   Allergies  Allergen Reactions  . Buspirone Other (See Comments)    Weakness  . Citalopram Other (See Comments)  . Lisinopril Cough  . Metformin Diarrhea    At 1000 mg dose  . Pravastatin Other (See Comments)    insomnia  . Sitagliptin Other (See Comments)    constipation  . Tramadol Itching  . Diltiazem Hcl Palpitations  . Gabapentin Palpitations  . Lovastatin Palpitations    VITALS:  Blood pressure 165/85, pulse 56, temperature 98.5 F (36.9 C), temperature source Oral, resp. rate 18, height 5\' 3"  (1.6 m), weight 108.863 kg (240 lb), SpO2 97 %.  PHYSICAL EXAMINATION:   Physical Exam  Constitutional: She is oriented to person, place, and time and well-developed, well-nourished, and in no distress. No distress.  HENT:  Head: Normocephalic.  Eyes: No scleral icterus.  Neck: Normal range of motion. Neck  supple. No JVD present. No tracheal deviation present.  Cardiovascular: Normal rate and regular rhythm.  Exam reveals no gallop and no friction rub.   Murmur heard. Pulmonary/Chest: Effort normal and breath sounds normal. No respiratory distress. She has no wheezes. She has no rales. She exhibits no tenderness.  Abdominal: Soft. Bowel sounds are normal. She exhibits no distension and no mass. There is no tenderness. There is no rebound and no guarding.  Patient now has ostomy.   Musculoskeletal: Normal range of motion. She exhibits no edema.  Neurological: She is alert and oriented to person, place, and time.  Skin: Skin is warm. No rash noted. No erythema.  Psychiatric: Affect and judgment normal.      LABORATORY PANEL:   CBC  Recent Labs Lab 03/12/15 0651  WBC 8.3  HGB 10.4*  HCT 33.3*  PLT 428   ------------------------------------------------------------------------------------------------------------------  Chemistries   Recent Labs Lab 03/12/15 0651  NA 141  K 3.4*  CL 105  CO2 26  GLUCOSE 159*  BUN 15  CREATININE 1.66*  CALCIUM 8.1*   ------------------------------------------------------------------------------------------------------------------  Cardiac Enzymes No results for input(s): TROPONINI in the last 168 hours. ------------------------------------------------------------------------------------------------------------------  RADIOLOGY:  No results found.   ASSESSMENT AND PLAN:   IMPRESSION AND PLAN: Patient is a 65 year old African-American female admitted with wound infection  1. 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 Possible home today or tomorrow with home health   2. Diabetes type 2: Patient has been started on a carb modified diet. As per diabetes recommendations do  not start Amaryl while she is inpatient. Continue sliding scale insulin. At discharge  she can resume her outpatient medications. 3. Hypertension patient is on metoprolol, Norvasc and Avapro and hydralazine.  Please send patient home on these medications and have her follow-up with her PCP within one week at discharge.   4. Hypothyroidism continue with Synthroid  5. Hyperlipidemia continue atorvastatin  6. Acute on chronic Anemia : This is likely due to debridement and surgery.hemoglobin is stable. 7. Chronic renal failure stage IV: Creatinine has actually improved better than baseline.    8. Atrial fibrillation/SVT: Patient is on amiodarone and digoxin. She has been seen by [facility] cardiology in the past.  9. Hypocalcemia: This is due to low albumin and underlying chronic disease. Pharmacy is assisting with hypocalcemia replacement. Calcium level has much improved and is within normal limits.. 10. Hallucinations: I suspect this is due to prolonged hospitalization and pain medications. She has had no issues with hallucinations for the past 2 days.  Management plans discussed with patient and she is in agreement DVT Prophylaxis: SCDs  TOTAL TIME TAKING CARE OF THIS PATIENT: 25 minutes.   Patient is medically stable for discharge. He sent her home with medications that she has here in the hospital. Please also send her home with her home diabetic medications. She will need a follow-up with her PCP within 1 week of discharge.  Thank you for allowing me to participate in the care of the patient I will sign off.   Ninel Abdella M.D on 03/13/2015 at 10:51 AM  Between 7am to 6pm - Pager - [phone]  After 6pm go to [email or web address] - password [email or web address] [facility]  Office  [phone]  CC: Primary care physician; Adin Hector, MD    Note: This dictation was prepared with Dragon dictation along with smaller phrase technology. Any transcriptional errors that result from this process are unintentional.

## 2015-03-14 ENCOUNTER — Telehealth: Payer: Self-pay

## 2015-03-14 NOTE — Telephone Encounter (Signed)
Post discharge call to patient made at this time. Pain is controlled at this time. [name] is coming to the home tomorrow. Colostomy is having good output. No questions or concerns.   Confirmed patient appointment scheduled. Encouraged patient to call with any questions that arise prior to appointment.

## 2015-03-15 ENCOUNTER — Telehealth: Payer: Self-pay

## 2015-03-15 NOTE — Telephone Encounter (Signed)
Received a call from [facility] Nurse- Merry Proud from [place] who is concerned about wound/ostomy. He sent a picture to my email, I spoke with and forwarded this to Dr. Adonis Huguenin who explains that this is exactly what this area has looked like since discharge and he is not concerned about this at all. Gave orders to place a dressing over the wound area and place the ostomy back to the left over the stoma.  Called Merry Proud back and this was relayed to him at this time.   Also spoke with his supervisor Hoyle Sauer at this time who thanked [place] for sending Ms. Sindoni to them and asked if their was anything further that is needed. I encouraged her to call back if she has further questions or concerns.

## 2015-03-20 ENCOUNTER — Telehealth: Payer: Self-pay | Admitting: General Surgery

## 2015-03-20 NOTE — Telephone Encounter (Signed)
Called and spoke with Jenny Reichmann from [facility] in reference to patient. She wanted a verbal order for patient to get Physical Therapy. I gave her a verbal order authorizing order. Cindy didn't need anything else from [place] at this time.

## 2015-03-20 NOTE — Telephone Encounter (Signed)
[facility] [phone] needs orders for PT

## 2015-03-20 NOTE — Telephone Encounter (Signed)
Returned [facility]'s call and had to leave a message.

## 2015-03-21 ENCOUNTER — Ambulatory Visit (INDEPENDENT_AMBULATORY_CARE_PROVIDER_SITE_OTHER): Payer: PPO | Admitting: Surgery

## 2015-03-21 ENCOUNTER — Encounter: Payer: Self-pay | Admitting: Surgery

## 2015-03-21 ENCOUNTER — Encounter (INDEPENDENT_AMBULATORY_CARE_PROVIDER_SITE_OTHER): Payer: Self-pay

## 2015-03-21 ENCOUNTER — Telehealth: Payer: Self-pay

## 2015-03-21 VITALS — BP 174/99 | HR 83 | Temp 98.4°F | Ht 63.0 in | Wt 211.0 lb

## 2015-03-21 DIAGNOSIS — K5732 Diverticulitis of large intestine without perforation or abscess without bleeding: Secondary | ICD-10-CM

## 2015-03-21 DIAGNOSIS — IMO0001 Reserved for inherently not codable concepts without codable children: Secondary | ICD-10-CM

## 2015-03-21 DIAGNOSIS — E669 Obesity, unspecified: Secondary | ICD-10-CM

## 2015-03-21 DIAGNOSIS — T814XXD Infection following a procedure, subsequent encounter: Secondary | ICD-10-CM

## 2015-03-21 NOTE — Patient Instructions (Signed)
Keep doing exactly what you are doing with this dressing. I will contact [facility] in regards to your supplies and get this taken care of. I am sorry that you have had these problems.  You are doing great!!!  Please follow-up with Dr. Adonis Huguenin as scheduled below.

## 2015-03-21 NOTE — Progress Notes (Signed)
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 Wound has been closed by delayed primary closure over a drain. She was discharged from the hospital recently after multiple almost every OD procedures.  She has minimal occasional abdominal pain no nausea vomiting no fevers or chills ostomy is fitting better.  His rectal exam reveals morbidly obese patient in no acute distress  The wound is granulating very well some of the sutures have pulled through and are removed some of the simple sutures are removed but the horizontal mattress sutures and figure-of-eight sutures are left in place for now. There is no erythema no drainage the JP drain has been removed previously area of the previous JP drain is open about 1 cm and is treated with silver nitrate as is an area on the midline wound in the pannus crease The granulation tissue around the ostomy lateral is treated with silver nitrate as well  Patient doing remarkably well will follow-up in 2-3 weeks with Dr. Adonis Huguenin for continued wound care probable suture removal ultimately did discuss colostomy closure

## 2015-03-21 NOTE — Telephone Encounter (Signed)
Jeff-RN from [facility] called asking for a verbal order for patient to see a social worker to help get her medical supplies since her insurance doesn't want to pay. I told them that it was okay.

## 2015-03-22 ENCOUNTER — Telehealth: Payer: Self-pay

## 2015-03-22 NOTE — Telephone Encounter (Signed)
Call made to [place] [phone] at this time. Spoke with [employer]. Explained that we have not received anything from [facility] as patient has explained in yesterday's appointment. She states that the last note entered states, "The patient needs to contact their insurance as a Coordination of Benefits needs to be done because patient has an out of network plan." I asked her what this meant. Georgette replied that most likely, patient was going to have to use another company because [employer] is out of network for this patient.   Called Patient and explained the conversation that took place with [place]. She will contact her insurance and keep me in the loop as to what we can do to help her obtain her supplies.

## 2015-03-23 ENCOUNTER — Telehealth: Payer: Self-pay

## 2015-03-23 NOTE — Telephone Encounter (Signed)
Called and spoke to Mrs. [name] since her daughter was asleep. I told patient that I had faxed her order for her supplies to [facility]. They will be mailing her supplies as soon as possible. Patient understood and was aware of what is going on. She had no further question. I also told her to tell her daughter [name] so she was aware as well. She stated that she would as soon as she wakes up.  I told patient to give [place] a call if she needs further assistance. She understood.

## 2015-03-28 ENCOUNTER — Telehealth: Payer: Self-pay

## 2015-03-28 NOTE — Telephone Encounter (Signed)
Called patient's daughter Luetta Nutting) to let her know that her paperwork FMLA has been filled out and ready for pick-up.

## 2015-03-30 ENCOUNTER — Other Ambulatory Visit: Payer: Self-pay | Admitting: General Surgery

## 2015-03-30 NOTE — Telephone Encounter (Signed)
Please call patient. She would like a refill of her pain medication.

## 2015-03-31 ENCOUNTER — Other Ambulatory Visit: Payer: Self-pay | Admitting: General Surgery

## 2015-03-31 NOTE — Telephone Encounter (Signed)
Patient is taking 2 pain medications at a time due to pain and doesn't want to run out. Needs a refill

## 2015-03-31 NOTE — Telephone Encounter (Signed)
See previous note for more information.

## 2015-03-31 NOTE — Telephone Encounter (Addendum)
Returned call to patient. She denies any other symptoms. Abdominal pain is no worse, getting slightly better by the day but is still in pain and would like a refill.  Spoke with Dr. Adonis Huguenin at this time. He would like to see patient in clinic and examine her prior to refilling her pain medications.  Returned phone call to patient. She states that she has enough to make through weekend but will run out on Sunday or Monday. Moved up patient's appointment to Monday 2/13 with Dr. Dahlia Byes in [facility] to discuss refill on pain medication.

## 2015-04-03 ENCOUNTER — Encounter: Payer: Self-pay | Admitting: Surgery

## 2015-04-03 ENCOUNTER — Telehealth: Payer: Self-pay

## 2015-04-03 ENCOUNTER — Ambulatory Visit (INDEPENDENT_AMBULATORY_CARE_PROVIDER_SITE_OTHER): Payer: PPO | Admitting: Surgery

## 2015-04-03 VITALS — BP 150/95 | HR 74 | Temp 99.0°F | Ht 63.0 in | Wt 211.0 lb

## 2015-04-03 DIAGNOSIS — K9409 Other complications of colostomy: Secondary | ICD-10-CM

## 2015-04-03 MED ORDER — OXYCODONE HCL 10 MG PO TABS: 10.0000 mg | ORAL_TABLET | Freq: Four times a day (QID) | ORAL | Status: DC | PRN

## 2015-04-03 NOTE — Patient Instructions (Addendum)
You have been given pain medication without Tylenol this visit. Please have this filled at your pharmacy.  We have sent a referral to pain management at this time to help you with your chronic pain. If you have not heard from their office by Thursday or Friday, please call our office.  If you have any questions or concerns.

## 2015-04-03 NOTE — Progress Notes (Signed)
[name] is a very nice 65 year old female morbidly obese following up 4 retraction of her colostomy. She also describes some intermittent pain. Her surgical history is complex and it was significant for a large bowel obstruction from a sigmoid diverticulitis stricture and also at that time she had a perforated  Cecum. 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 Felton Clinton. 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 Now that she has a retracted end colostomy. She is having good  Output.   visit exam: morbidly obese female in no acute distress  Abdomen: large pannus there is a retracted N colostomy in the left upper quadrant. There is a 4 x 3 cm granulation tissue lateral to the colostomy and there is some induration and some stitches that we were able to remove. There is no evidence of any tracking to the abdominal wall, there is no evidence of fluctuance or tenderness. I cannot really see the mucosa of the colostomy because of its retraction. We were able to change the appliance. The rest of the abdomen is soft and nontender.   A/P  Attracted colostomy. Currently working as a CA with significant prolonged healing of the wound. We'll continue to do local wound care and will refer her to the colostomy nurse I do think that Line some  , paced and may be choosing a complex appliance my beneficial. Given her continuation of pain I will refill her with oxycodone 10 mg and we will refer her to pain  medicine given its chronicity.  to encourage for weight management control. She may require additional intervention but given her comorbidities and her abdominal pannus and super morbid obesity this will be quite a challenging case. I do not see the need for immediate intervention at this time. We  will have her f/u w [place] in a couple  Weeks with with Dr. Sanda Klein

## 2015-04-03 NOTE — Telephone Encounter (Signed)
Please send referral to [facility] for Chronic Abdominal Pain

## 2015-04-04 NOTE — Telephone Encounter (Signed)
I have sent a referral to Pain Management at [facility] in [facility]. I will follow up and make sure this appointment is made in a timely manner.

## 2015-04-05 ENCOUNTER — Ambulatory Visit: Payer: PPO | Admitting: General Surgery

## 2015-04-07 ENCOUNTER — Telehealth: Payer: Self-pay | Admitting: General Surgery

## 2015-04-07 NOTE — Telephone Encounter (Signed)
Returned phone call to [place] at this time. Message left explaining that I have a message in currently to our Ostomy Nurse, Santiago Glad because our surgeon feels like ostomy appliance needs changed to a convex wafer for this patient. I will return phone call to [facility] as soon as I hear back from Ostomy nurse as I do not want to put a new order for supplies in until I know that they do or do not need changed.

## 2015-04-07 NOTE — Telephone Encounter (Signed)
Jenny Reichmann called from [facility] and needing some ostomy supplies. Please call her at [phone]

## 2015-04-10 NOTE — Telephone Encounter (Signed)
Received a return email from the Ostomy Nurse, she would like to meet patient tomorrow or Thursday.  Spoke with patient and she will meet Domenic Moras at 11am on Thursday morning in our office.  Awaiting confirmation from Santiago Glad at this time.   Patient also explains that pain management has not called her in regards to her appointment. Will check on this in am, as pain clinic is closed at this time and will get back to patient.

## 2015-04-10 NOTE — Telephone Encounter (Signed)
Have not heard anything back from Ostomy Nurse, she she was emailed again at this time. Will contact Cindy and patient as soon as I hear back.

## 2015-04-10 NOTE — Telephone Encounter (Signed)
Patient called and said Amber was suppose to call her and she hasn't heard back

## 2015-04-11 NOTE — Telephone Encounter (Signed)
Spoke with referral coordinator at [facility], she is approximately 2 weeks behind on scheduling at this time. But she has received referral and will be calling next week to schedule patient's appointment.  Returned phone call to patient at this time. Explained above information. She has enough pain medication at this time and the medication she was switched to last visit, is working better for her pain. Asked patient to let me know if she does not hear back from pain management by end of next week. She verbalized understanding of this.

## 2015-04-12 NOTE — Telephone Encounter (Signed)
I have called Pain Management and spoke with Angie (referral coordinator). She stated that she is doing the referrals from 03/28/15 that were sent. This referral was sent on 04/04/15. She stated that the referral will be looked at by a nurse to view the chart then once a physician is chosen then an appointment will be made depending on how booked out that physician is. I will call in another week to check the referral status if an appointment has not been made.

## 2015-04-13 ENCOUNTER — Telehealth: Payer: Self-pay | Admitting: Surgery

## 2015-04-13 MED ORDER — OXYCODONE HCL 10 MG PO TABS: 10.0000 mg | ORAL_TABLET | Freq: Four times a day (QID) | ORAL | Status: DC | PRN

## 2015-04-13 NOTE — Telephone Encounter (Signed)
Pain clinic has NOT called her yet. Please call [name]

## 2015-04-13 NOTE — Telephone Encounter (Signed)
Patient called back and is on her way to pick up her prescription.

## 2015-04-13 NOTE — Telephone Encounter (Signed)
Spoke with Dr. Azalee Course and prescription is ready in [facility] office for pick-up.  Call made to patient and patient's daughter at this time. Patient's number is ringing busy and daughter's number is disconnected.  Will call patient back at a later time to give her this information.

## 2015-04-13 NOTE — Telephone Encounter (Signed)
Call made once again today to (Angie) at pain clinic. She cannot expedite this request. Spoke with Dr. Azalee Course and patient has been prescribed another prescription of pain medication to make it through until her appointment.

## 2015-04-13 NOTE — Telephone Encounter (Signed)
Called to pain clinic and spoke with referral coordinator (Angie). She cannot do anything to expedite this request. Patient states that she has 6 tablets left of pain medication.   Explained that I would have to speak with our surgeon that is on today and see if she will order patient another prescription of pain medication until she can be seen at pain clinic.

## 2015-04-14 ENCOUNTER — Telehealth: Payer: Self-pay | Admitting: Surgery

## 2015-04-14 NOTE — Telephone Encounter (Signed)
Patient has called and stated that the new type of colostomy bag that was placed yesterday is not working. She describes to me that the little hole is causing the stool to not come through and is leaking. She would like to stay with the colostomy bags that she had previously. Please call patient to discuss this.

## 2015-04-14 NOTE — Telephone Encounter (Signed)
Spoke with patient at this time. Explained that I have heard back from [place] and she is not advising that patient goes back to her original colostomy supplies. She would like for patient to cut the hole in the wafer larger so that stool may come out instead of leaking around the ostomy.  Patient was not really happy about hearing this but I explained that the reason we changed her supplies was to reduce chance of wound and ostomy getting infected. Ostomy supplies will be ordered for new supplies.

## 2015-04-14 NOTE — Telephone Encounter (Signed)
Patient called again requesting to speak with Amber. I explained to patient that Amber was currently in clinic and that she was waiting to hear back from the Ostomy Nurse, Domenic Moras. I told her that once we heard from the ostomy nurse Amber would contact the patient. Ms [name] verbalized understanding of this and said she would wait to hear back from [employer].   Ms [name] called back about 45 minutes later and said she needed to speak with Amber and that is was very important. I explained that Amber was in clinic with a patient and would call her today and soon as she was finished with clinic. Patient states that the new colostomy bags that were given to her yesterday, by the ostomy nurse are not working and that she wants to go back to the bags she was using previously. She states her supplies are very low.

## 2015-04-14 NOTE — Telephone Encounter (Signed)
Contacted Domenic Moras at this time. Awaiting a phone call back at this time.

## 2015-04-20 ENCOUNTER — Telehealth: Payer: Self-pay | Admitting: Surgery

## 2015-04-20 ENCOUNTER — Encounter: Payer: Self-pay | Admitting: General Surgery

## 2015-04-20 NOTE — Telephone Encounter (Signed)
Returned phone call to this patient. She has gotten her recently ordered supplies. She is concerned because the "stoma hole" she feels like is getting smaller and she is concerned that this will close. Also wanted to know status of pain clinic appointment.  Spoke with Dr. Adonis Huguenin about her concerns with the stoma. He states that the wound around the stoma is becoming smaller and that is why it looks like the stoma is getting smaller but would like to see her back in clinic the next time he has an availability. Patient's appointment was made at this time.  Call made to pain clinic and spoke with [facility] and explained that we had placed a referral on the 13th of February and patient is in great need for an appointment. She informed me that we should hear something from the referral coordinator no later than this coming Tuesday. If she does not, she would like to have patient call and ask for [facility]. [phone].  Called patient once again and the pain clinic information was given to her.

## 2015-04-20 NOTE — Telephone Encounter (Signed)
[name], Patient called and wanted to know if you got her into the pain clinic yet?

## 2015-04-20 NOTE — Telephone Encounter (Signed)
error 

## 2015-04-25 NOTE — Progress Notes (Signed)
Patient met with the Wound Ostomy Nurse in our office and below are her new ostomy supplies:  1 piece convex pouch (2 inch) Barrier rings Daniels

## 2015-04-25 NOTE — Telephone Encounter (Signed)
I have spoken with Angie-the referral coordinator about [name]'s referral that was sent on 04/04/15. She stated that this was sent to Dr Baruch Merl and he denied seeing her. The referral was then passed to Dr Primus Bravo to be reviewed. I have asked how much longer this process would take and she stated there is not a time limit on these referrals. She informed me that if the patient needed to be much sooner that one of our physician's would need to talk to one of their doctors if this is a STAT referral.

## 2015-04-27 ENCOUNTER — Telehealth: Payer: Self-pay | Admitting: Surgery

## 2015-04-27 ENCOUNTER — Telehealth: Payer: Self-pay | Admitting: General Surgery

## 2015-04-27 NOTE — Telephone Encounter (Signed)
Returned phone call to patient. She states that she wants to move her appointment up because she is still worried that her Stoma is closing up. I explained to her that I have never known this to happen but I am sure that it probably looks as though it is getting smaller because the wound is getting better. I also asked her about her Pain Center referral and she says that she spoke with [facility] on Tuesday and was told that she would return patient's call next Tuesday with appointment.

## 2015-04-27 NOTE — Telephone Encounter (Signed)
Noted  

## 2015-04-27 NOTE — Telephone Encounter (Signed)
I put a missed visit note to the patients chart that you might want to see.

## 2015-04-27 NOTE — Telephone Encounter (Signed)
Patient has an appt on the March 22 and has a question about the incision closing up around her colostomy bag.

## 2015-05-02 ENCOUNTER — Other Ambulatory Visit: Payer: Self-pay | Admitting: *Deleted

## 2015-05-02 DIAGNOSIS — E139 Other specified diabetes mellitus without complications: Secondary | ICD-10-CM

## 2015-05-02 NOTE — Patient Outreach (Addendum)
[name]) Care Management  05/02/2015  [name] 01-03-1951 [id number]   Subjective: Telephone call to patient's home number, spoke with patient, and HIPAA verified.  Patient gave verbal authorization to [name] to speak with patient's daughter [name] and sister  [name] regarding patient's healthcare needs as needed.  Discussed [facility] Management services and patient in agreement to receive services.   Patient states she is doing pretty good considering everything that she has been through.    Patient in agreement to a referral to [facility] for diabetes education, disease monitoring, care coordination of ostomy supplies, care coordination with wound ostomy management, care coordination of pain clinic appointment, care coordination of follow up appointment with primary MD, and community resources.   Patient is currently receiving ostomy supplies from [facility].   Patient states she is currently out of her pain medicine and has not received a call back from the pain management clinic to schedule appointment.   Patient is planning to continue to follow up on obtaining pain management clinic appointment.  Patient states her next appointment with surgeon (Dr. Adonis Huguenin) is  05/10/15.    Patient states her primary MD is Dr. Ramonita Lab.  Patient in agreement to continue to receive [facility] services.  Patient given RNCM's contact number, [name] Care Management main phone number, and 24 hour Nurse Advice line number for future reference.   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   Patient last saw wound ostomy nurse Domenic Moras in MD office on 04/25/15.    Patient was hospitalized 02/21/15 - 03/13/15 for Abdominal wound.    Patient has had home health services through [facility].   Assessment:  Received [facility] referral on 04/21/15.   Referral source: Jettie Booze.    Reason Reason:  Disease and symptom management.     Patient with recent hospital discharge.   Patient will continue to receive [facility] services.   No Telephonic RNCM needs at this time.  Plan: RNCM will refer patient to [name] for diabetes education, disease monitoring, care coordination of ostomy supplies, care coordination with wound ostomy management, care coordination of pain clinic appointment, care coordination of follow up appointment with primary MD, and community resources.   Raenette Sakata H. Annia [name], BSN, [name] Telephonic CM Phone: [phone] Fax: [phone]

## 2015-05-08 ENCOUNTER — Other Ambulatory Visit: Payer: Self-pay | Admitting: *Deleted

## 2015-05-08 ENCOUNTER — Telehealth: Payer: Self-pay

## 2015-05-08 NOTE — Patient Outreach (Addendum)
[facility]) Care Management  05/08/2015  SHANLEE TAUSSIG 1950-10-22 [id number]  Subjective: Telephone call from patient and HIPAA verified. Patient states she has not heard from [facility] regarding follow up appointment.   Telephonic RNCM advised patient that Community RNCM will contact patient within 10 business days of the referral.  States she is needing assistance with obtaining ostomy supplies sooner because she is almost out of supplies.  RNCM advised patient RNCM will contact Community RNCM and request sooner call.    Patient is appreciative update and is in agreement to continue to receive [facility] services. Telephone call to Kathie Rhodes at [facility] and left HIPAA compliant voice mail message regarding above request.   RNCM also sent urgent in basket message regarding above request.   Objective: Per Epic chart review: Patient has a history of abdominal wound, colostomy, and possible retracted colostomy. Patient last saw wound ostomy nurse Domenic Moras in MD office on 04/25/15. Patient was hospitalized 02/21/15 - 03/13/15 for Abdominal wound. Patient has had home health services through [facility]   Assessment: Received [facility] referral on 04/21/15. Referral source: Jettie Booze. Reason Reason: Disease and symptom management. Patient with recent hospital discharge. Patient will continue to receive [facility] services. No Telephonic RNCM needs at this time.  Plan: RNCM will send updated urgent referral and in basket message to [facility]     Odie Edmonds H. Annia Friendly, BSN, [facility] Telephonic CM Phone: [phone] Fax: [phone]

## 2015-05-08 NOTE — Telephone Encounter (Signed)
Received a fax from [facility] this am stating that patient's BP was taken by [place] Nurse- Leward Quan. Her reported BP's are: 192/122 in right arm and 192/118 in left arm. This information was called to patient's PCP, Dr. Ramonita Lab. Spoke with [name]- she has put a note in to the physician.

## 2015-05-08 NOTE — Patient Outreach (Signed)
Telephone call  to pt- f/u on pt's call today with [facility] telephonic RN CM as well as referral received last week from same telephonic RN CM.   Spoke with pt, HIPPA verified.  Pt reports she is getting low on her ostomy supplies, got one shipment from [facility] and had to buy her own supplies last week. Pt reports she has not called [facility], has their number as daughter usually takes care of that.  Pt reports she has 2 days left of ostomy supplies.   Pt reports to f/u with Dr. Shirley Muscat (surgeon) 3/22, last visit with MD was seen by ostomy nurse (view in [place], ostomy nurse provided pt with new ostomy supplies).   As discussed with pt,  either she or daughter will call  [facility] and find out what is going on.    Provided pt with name and contact number- as agreed pt to return call to RN CM to let her know outcome of call to [facility].     RN CM to call pt again tomorrow if no return call, schedule home visit.    [name].   [facility]  [phone]

## 2015-05-09 ENCOUNTER — Other Ambulatory Visit: Payer: Self-pay | Admitting: *Deleted

## 2015-05-09 NOTE — Patient Outreach (Signed)
Follow up call made earlier today.   See previous note.   [name].   [facility]  [phone]

## 2015-05-09 NOTE — Patient Outreach (Signed)
Follow up phone call:  Spoke with pt, HIPPA verified.   Pt reports called [facility], they are to  send ostomy supplies the end of the week, expect delivery Saturday or Sunday.  Pt reports she has to change her ostomy every day due to surgery she had, to f/u at MD office 3/22, to ask to approve more ostomy supplies a month.  As discussed, while at MD office visit, pt to see if they can give her ostomy supplies to last until  Delivery comes.  Also discussed with pt (view in Epic) pt's elevated BP taken by [facility] RN.  Pt reports she does not know why BP was elevated, took her medication, did not receive a call from MD, does not check her BP (no BP machine).  As discussed, plan to f/u with pt 3/27- initial home visit.   [name].   [facility]  [phone]

## 2015-05-10 ENCOUNTER — Ambulatory Visit (INDEPENDENT_AMBULATORY_CARE_PROVIDER_SITE_OTHER): Payer: PPO | Admitting: General Surgery

## 2015-05-10 ENCOUNTER — Ambulatory Visit: Payer: Self-pay | Admitting: General Surgery

## 2015-05-10 ENCOUNTER — Encounter: Payer: Self-pay | Admitting: General Surgery

## 2015-05-10 VITALS — BP 165/125 | HR 78 | Temp 98.4°F | Ht 63.0 in | Wt 209.6 lb

## 2015-05-10 DIAGNOSIS — K9409 Other complications of colostomy: Secondary | ICD-10-CM

## 2015-05-10 MED ORDER — OXYCODONE HCL 10 MG PO TABS: 10.0000 mg | ORAL_TABLET | Freq: Four times a day (QID) | ORAL | Status: DC | PRN

## 2015-05-10 NOTE — Patient Instructions (Addendum)
Today we have seen you regarding your ostomy. It looks great! As long as the bag has output, we are happy! If the output from the ostomy stops, call our office immediately.  We will need to get clearance from Cardiology and PCP prior to performing your Colostomy Reversal. Once we have obtained clearance from both Physicians, we will see you back in the office.  We will check once again with the Pain Clinic, to see when this appointment will be made.

## 2015-05-10 NOTE — Progress Notes (Signed)
Outpatient Surgical Follow Up  05/10/2015  [name] is an 65 y.o. female.   Chief Complaint  Patient presents with  . Follow-up    Colostomy and Abdominal Wound    HPI: 65 year old female returns to clinic for evaluation of her left upper quadrant ostomy wound. The wound that had been around her ostomy appears to have completely healed around the functional ostomy site. The ostomy site is retracted as dysfunctional mucosa is at the fascia and not at the skin. The area has continually productive of gas and stool. She denies any fevers, chills, nausea, vomiting, chest pain, shortness of breath. She does have significant pain at the site related to her multiple surgeries.  Past Medical History  Diagnosis Date  . Diabetes mellitus without complication ([place])   . GI bleed   . Aortic valve stenosis   . Hyperlipidemia   . Hypertension   . Diverticulitis large intestine   . Obesity   . Shortness of breath dyspnea   . Hyperthyroidism   . Anxiety   . Arthritis   . Neuropathy in diabetes ([place])   . Anemia   . Vertigo   . Bowel obstruction [facility])     Past Surgical History  Procedure Laterality Date  . Colectomy    . Laparotomy closure of cecal perforation  05/09/2013    Dr. Marina Gravel  . Tonsillectomy    . Back surgery      spur frmoved from lower back  . Cardiac catheterization    . Abdominal hysterectomy    . Laparotomy N/A 02/06/2015    Procedure: Laparotomy, reduction of incarcerated parastomal hernia, repair of parastomal hernia with mesh;  Surgeon: Sherri Rad, MD;  Location: [facility] ORS;  Service: General;  Laterality: N/A;  . Debridement of abdominal wall abscess N/A 02/22/2015    Procedure: DEBRIDEMENT OF ABDOMINAL WALL ABSCESS;  Surgeon: Clayburn Pert, MD;  Location: [facility] ORS;  Service: General;  Laterality: N/A;  . Excision mass abdominal N/A 02/24/2015    Procedure: EXCISION MASS ABDOMINAL  / [place];  Surgeon: Clayburn Pert, MD;  Location: [facility] ORS;  Service: General;   Laterality: N/A;  . Application of wound vac N/A 02/24/2015    Procedure: APPLICATION OF WOUND VAC;  Surgeon: Clayburn Pert, MD;  Location: [facility] ORS;  Service: General;  Laterality: N/A;  . Excision mass abdominal N/A 02/26/2015    Procedure: EXCISION MASS ABDOMINAL/wash out;  Surgeon: Clayburn Pert, MD;  Location: [facility] ORS;  Service: General;  Laterality: N/A;  . Application of wound vac N/A 02/26/2015    Procedure: APPLICATION OF WOUND VAC;  Surgeon: Clayburn Pert, MD;  Location: [facility] ORS;  Service: General;  Laterality: N/A;  . Wound debridement N/A 03/03/2015    Procedure: DEBRIDEMENT ABDOMINAL WOUND;  Surgeon: Florene Glen, MD;  Location: [facility] ORS;  Service: General;  Laterality: N/A;  . Wound debridement N/A 03/09/2015    Procedure: DEBRIDEMENT ABDOMINAL WOUND;  Surgeon: Florene Glen, MD;  Location: [facility] ORS;  Service: General;  Laterality: N/A;    Family History  Problem Relation Age of Onset  . Diabetes Mother   . Hypertension Father     Social History:  reports that she quit smoking about 2 years ago. Her smoking use included Cigarettes. She has a .25 pack-year smoking history. She has never used smokeless tobacco. She reports that she does not drink alcohol or use illicit drugs.  Allergies:  Allergies  Allergen Reactions  . Buspirone Other (See Comments)    Weakness  .  Citalopram Other (See Comments)  . Lisinopril Cough  . Metformin Diarrhea    At 1000 mg dose  . Pravastatin Other (See Comments)    insomnia  . Sitagliptin Other (See Comments)    constipation  . Tramadol Itching  . Diltiazem Hcl Palpitations  . Gabapentin Palpitations  . Lovastatin Palpitations    Medications reviewed.    ROS A multipoint review of systems was completed, all pertinent positives and negatives are documented within the history of present illness and remainder are negative   BP 165/125 mmHg  Pulse 78  Temp(Src) 98.4 F (36.9 C) (Oral)  Ht 5\' 3"  (1.6 m)  Wt 95.074 kg  (209 lb 9.6 oz)  BMI 37.14 kg/m2  Physical Exam  Gen.: No acute distress Chest: Clear to auscultation Heart: Regular rate and rhythm Abdomen: Large, soft, nondistended. Well-healed complex midline scar. Ostomy site present left upper quadrant with a well-healed lateral wound. No evidence of superficial infection. Ostomy site is approximately 1 cm in diameter but functional.   No results found for this or any previous visit (from the past 48 hour(s)). No results found.  Assessment/Plan:  1. Colostomy, retracted [facility]) 65 year old female with a complex history that started with sigmoid diverticulitis. 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 This has primarily healed. Discussed this a long as the ostomy site is functional there is no surgical urgency to revise the area. She is now 3 months status post parastomal hernia repair and has done well. Discussed that it should be safe to offer her reversal of her ostomy at this time. However, she has uncontrolled hypertension on her visit today and has a significant past medical history of diabetes, heart disease, lung disease in combination with her obesity. Stated she would require medical and cardiological clearance prior to any surgery being offered as it would be a large, difficult surgery. She voiced understanding and we will have her return to clinic once she receives medical clearance to discuss her ostomy reversal.     Clayburn Pert, MD [place] Surgeon  05/10/2015,1:35 PM

## 2015-05-15 ENCOUNTER — Encounter: Payer: Self-pay | Admitting: *Deleted

## 2015-05-15 ENCOUNTER — Other Ambulatory Visit: Payer: Self-pay | Admitting: *Deleted

## 2015-05-16 ENCOUNTER — Encounter: Payer: Self-pay | Admitting: *Deleted

## 2015-05-16 NOTE — Patient Outreach (Signed)
[name]) Care Management   Home visit done 05/15/15  [name] 1950/03/16 [id number]  [name] is an 65 y.o. female  Subjective:  Pt reports did receive her ostomy supplies from [facility], need to increase the amount as sometimes has to change ostomy  three times a day.  Pt reports wound is under wafer and if stool gets on it, have to change wafer.  Pt reports she has had her ostomy 2 years and was told by MD can do reversal when wound is healed (a month).  Pt reports sugar today was 122.  Pt reports on elevated BP, Dr Caryl Comes started her on a new BP medication (hydralazine) 3/23.  Pt reports does not check her BP, needs a BP machine.     Objective:   Filed Vitals:   05/15/15 1354 05/15/15 1412  BP: 148/90 139/88  Pulse: 68 71  Resp: 16     ROS  Physical Exam  Constitutional: She is oriented to person, place, and time. She appears well-developed and well-nourished.  Cardiovascular: Normal rate, regular rhythm and normal heart sounds.   Respiratory: Effort normal and breath sounds normal.  GI: Soft. Bowel sounds are normal.  Ostomy intact  Musculoskeletal: Normal range of motion.  Neurological: She is alert and oriented to person, place, and time.  Skin: Skin is warm and dry.  Psychiatric: She has a normal mood and affect. Her behavior is normal. Judgment and thought content normal.    Current Medications:  Reviewed with pt  Current Outpatient Prescriptions  Medication Sig Dispense Refill  . atorvastatin (LIPITOR) 40 MG tablet Take 40 mg by mouth at bedtime.  2  . azelastine (ASTELIN) 0.1 % nasal spray Place into both nostrils 2 (two) times daily as needed.    . clobetasol cream (TEMOVATE) [id number] % Apply 1 application topically 2 (two) times daily as needed (for rash).   2  . DULoxetine (CYMBALTA) 30 MG capsule Take 30 mg by mouth daily.   11  . glimepiride (AMARYL) 4 MG tablet Take 4 mg by mouth 2 (two) times daily.   3  . insulin glargine (LANTUS) 100  UNIT/ML injection Inject 10 Units into the skin at bedtime.    Marland Kitchen levothyroxine (SYNTHROID, LEVOTHROID) 88 MCG tablet Take 88 mcg by mouth every morning.   2  . metoprolol succinate (TOPROL-XL) 25 MG 24 hr tablet Take 25 mg by mouth daily.    Marland Kitchen nystatin (MYCOSTATIN/NYSTOP) 100000 UNIT/GM POWD Apply topically daily. Under abdominal pannuses  3  . Oxycodone HCl 10 MG TABS Take 1 tablet (10 mg total) by mouth every 6 (six) hours as needed. 30 tablet 0  . telmisartan (MICARDIS) 80 MG tablet Take 80 mg by mouth daily.    Marland Kitchen amiodarone (PACERONE) 200 MG tablet Take 1 tablet (200 mg total) by mouth daily. (Patient not taking: Reported on 05/15/2015) 30 tablet 1  . bisacodyl (DULCOLAX) 5 MG EC tablet Take 4 tablets (20 mg total) by mouth once. Please see surgery prep instructions (Patient not taking: Reported on 05/15/2015) 4 tablet 0  . calcium carbonate (TUMS) 500 MG chewable tablet Chew 1 tablet (200 mg of elemental calcium total) by mouth daily. (Patient not taking: Reported on 05/15/2015) 30 tablet 0  . digoxin 62.5 MCG TABS Take 0.0625 mg by mouth daily. (Patient not taking: Reported on 05/15/2015) 30 tablet 1  . metoprolol succinate (TOPROL-XL) 100 MG 24 hr tablet Take 1 tablet (100 mg total) by mouth 2 (two)  times daily. (Patient not taking: Reported on 05/15/2015) 60 tablet 1  . polyethylene glycol powder (GLYCOLAX/MIRALAX) powder Take 255 g by mouth once. Please see surgery prep instructions given at appointment (Patient not taking: Reported on 05/15/2015) 255 g 0   No current facility-administered medications for this visit.    Functional Status:   In your present state of health, do you have any difficulty performing the following activities: 05/15/2015 03/09/2015  Hearing? N N  Vision? N Y  Difficulty concentrating or making decisions? N N  Walking or climbing stairs? Y Y  Dressing or bathing? N Y  Doing errands, shopping? N -  Preparing Food and eating ? N -  Using the Toilet? N -  In the past  six months, have you accidently leaked urine? N -  Do you have problems with loss of bowel control? N -  Managing your Medications? N -  Managing your Finances? N -  Housekeeping or managing your Housekeeping? Y -    Fall/Depression Screening:    PHQ 2/9 Scores 05/02/2015  PHQ - 2 Score 0    Assessment:   Ostomy- wafer intact.  As reported by pt, will need additional supplies related to frequent wafer changes.                           HTN-   Pt's BP today with nurse's cuff - 148/90, with pt's BP machine (provided by [name])- 139/88.                                       No edema noted.     Plan:  As discussed, pt to call Dr. Caryl Comes- request order be sent to [facility] for  additional ostomy bags            Pt to check BP daily, record, bring readings to next MD appointment, call MD if elevated.            Plan to continue to follow pt with community nurse case management services, next home visit 4/27.            Plan to fax  [place] barrier letter and 3/27 encounter  to Dr. Caryl Comes             [facility] CM Care Plan Problem One        Most Recent Value   Care Plan Problem One  elevated BP    Role Documenting the Problem One  Care Management [place] for Problem One  Active   THN Long Term Goal (31-90 days)  Pt's BP would be within normal limits in the next 60 days    THN Long Term Goal Start Date  05/15/15   Interventions for Problem One Long Term Goal  Provided pt with BP machine, instructions given with pt demonstrating use.    THN CM Short Term Goal #1 (0-30 days)  Pt would monitor BP, record for the next 30 days    THN CM Short Term Goal #1 Start Date  05/15/15   Interventions for Short Term Goal #1  Provided pt with [facility] calendar to record readings.    THN CM Short Term Goal #2 (0-30 days)  Pt would monitor sodium intake for the next 30 days    THN CM Short Term Goal #2 Start Date  05/15/15   Interventions for Short Term Goal #2  Provided/reviewed Emmi information on  HTN-What you can do, included foods high/low in sodium      [name]   [place] Care Management  [phone]

## 2015-05-30 ENCOUNTER — Telehealth: Payer: Self-pay | Admitting: General Surgery

## 2015-05-30 NOTE — Telephone Encounter (Signed)
Called patient at this time to see when patient rescheduled her Cardiology appointment with Dr. Nehemiah Massed for Cardiac Clearance. No answer. Unable to leave voicemail as phone continued to ring and ring. Will try back once again tomorrow.

## 2015-05-30 NOTE — Telephone Encounter (Signed)
[name] from Dr Alveria Apley office has called and stated that Patient called and canceled her appointment for cardiac clearance that was scheduled for 05/31/15.

## 2015-05-31 NOTE — Telephone Encounter (Signed)
Patient called back and said she has not made an appointment with Dr. Nehemiah Massed for Cardiac Clearance. I told her to go ahead and do that and to please call back and give me the date/time. She also stated she has an appointment with the pain clinic on 5/26 but would like to have some more oxycodone called in before that appointment. She said she was still hurting a little. You can call her at [phone]

## 2015-05-31 NOTE — Telephone Encounter (Signed)
Spoke with patient at this time. She states that she has been having worsening abdominal pain and is completely out of her pain medications. Patient has appointment with pain management on 07/03/15. Also has been having loose bowels through colostomy making this difficult to seal. Gave patient some ideas on how to troubleshoot this problem so that she does not have to change her Colostomy bag as frequently.   Patient placed on schedule to see Dr. Adonis Huguenin on 06/06/15.  Also patient has rescheduled Cardiology appointment for clearance on 06/09/15 at 1115am with Dr. Nehemiah Massed.

## 2015-06-05 ENCOUNTER — Other Ambulatory Visit: Payer: Self-pay

## 2015-06-06 ENCOUNTER — Encounter: Payer: Self-pay | Admitting: General Surgery

## 2015-06-06 ENCOUNTER — Ambulatory Visit (INDEPENDENT_AMBULATORY_CARE_PROVIDER_SITE_OTHER): Payer: PPO | Admitting: General Surgery

## 2015-06-06 VITALS — BP 159/90 | HR 71 | Temp 98.3°F | Wt 202.0 lb

## 2015-06-06 DIAGNOSIS — K9409 Other complications of colostomy: Secondary | ICD-10-CM

## 2015-06-06 DIAGNOSIS — G8929 Other chronic pain: Secondary | ICD-10-CM

## 2015-06-06 DIAGNOSIS — R1013 Epigastric pain: Secondary | ICD-10-CM | POA: Diagnosis not present

## 2015-06-06 MED ORDER — ZOLPIDEM TARTRATE ER 6.25 MG PO TBCR: 6.2500 mg | EXTENDED_RELEASE_TABLET | Freq: Every evening | ORAL | Status: DC | PRN

## 2015-06-06 MED ORDER — OXYCODONE-ACETAMINOPHEN 5-325 MG PO TABS: 1.0000 | ORAL_TABLET | ORAL | Status: DC | PRN

## 2015-06-06 NOTE — Patient Instructions (Signed)
Please follow up with Dr. Caryl Comes, Dr. Nehemiah Massed and attend your appointment with the Pain Clinic.  Please tell Dr. Caryl Comes that your blood pressure is still elevated.  Once we get medical and cardiac clearance, we will need to see you back so we could schedule your surgery.  This will be my last prescription for pain medications until after you have surgery.

## 2015-06-06 NOTE — Progress Notes (Signed)
Outpatient Surgical Follow Up  06/06/2015  [name] is an 65 y.o. female.   Chief Complaint  Patient presents with  . Follow-up    Abdominal Pain    HPI: 65 year old female returns to clinic for follow-up of abdominal pain and a retracted left upper quadrant ostomy. Patient states that since she was last seen she had an acute illness which caused her to have nausea and vomiting and worsening abdominal pain. The illness has since subsided with abdominal pain has persisted. She states she's been unable to sleep secondary to the pain and the pain is worsened with any activity or exertion. She denies any current fevers, chills, nausea, vomiting, diarrhea, gas patient, chest pain, shortness of breath. Ostomy is continued to function well. She canceled her most recent cardiology appointment and had rescheduled for later this month. Her last visit with her and her medicine doctor changed her medications but did not clear her for surgery.  Past Medical History  Diagnosis Date  . Diabetes mellitus without complication ([place])   . GI bleed   . Aortic valve stenosis   . Hyperlipidemia   . Hypertension   . Diverticulitis large intestine   . Obesity   . Shortness of breath dyspnea   . Hyperthyroidism   . Anxiety   . Arthritis   . Neuropathy in diabetes ([place])   . Anemia   . Vertigo   . Bowel obstruction [facility])     Past Surgical History  Procedure Laterality Date  . Colectomy    . Laparotomy closure of cecal perforation  05/09/2013    Dr. Marina Gravel  . Tonsillectomy    . Back surgery      spur frmoved from lower back  . Cardiac catheterization    . Abdominal hysterectomy    . Laparotomy N/A 02/06/2015    Procedure: Laparotomy, reduction of incarcerated parastomal hernia, repair of parastomal hernia with mesh;  Surgeon: Sherri Rad, MD;  Location: [facility] ORS;  Service: General;  Laterality: N/A;  . Debridement of abdominal wall abscess N/A 02/22/2015    Procedure: DEBRIDEMENT OF ABDOMINAL WALL  ABSCESS;  Surgeon: Clayburn Pert, MD;  Location: [facility] ORS;  Service: General;  Laterality: N/A;  . Excision mass abdominal N/A 02/24/2015    Procedure: EXCISION MASS ABDOMINAL  / [place];  Surgeon: Clayburn Pert, MD;  Location: [facility] ORS;  Service: General;  Laterality: N/A;  . Application of wound vac N/A 02/24/2015    Procedure: APPLICATION OF WOUND VAC;  Surgeon: Clayburn Pert, MD;  Location: [facility] ORS;  Service: General;  Laterality: N/A;  . Excision mass abdominal N/A 02/26/2015    Procedure: EXCISION MASS ABDOMINAL/wash out;  Surgeon: Clayburn Pert, MD;  Location: [facility] ORS;  Service: General;  Laterality: N/A;  . Application of wound vac N/A 02/26/2015    Procedure: APPLICATION OF WOUND VAC;  Surgeon: Clayburn Pert, MD;  Location: [facility] ORS;  Service: General;  Laterality: N/A;  . Wound debridement N/A 03/03/2015    Procedure: DEBRIDEMENT ABDOMINAL WOUND;  Surgeon: Florene Glen, MD;  Location: [facility] ORS;  Service: General;  Laterality: N/A;  . Wound debridement N/A 03/09/2015    Procedure: DEBRIDEMENT ABDOMINAL WOUND;  Surgeon: Florene Glen, MD;  Location: [facility] ORS;  Service: General;  Laterality: N/A;    Family History  Problem Relation Age of Onset  . Diabetes Mother   . Hypertension Father     Social History:  reports that she quit smoking about 2 years ago. Her smoking use included  Cigarettes. She has a .25 pack-year smoking history. She has never used smokeless tobacco. She reports that she does not drink alcohol or use illicit drugs.  Allergies:  Allergies  Allergen Reactions  . Buspirone Other (See Comments)    Weakness  . Citalopram Other (See Comments)  . Lisinopril Cough  . Metformin Diarrhea    At 1000 mg dose  . Pravastatin Other (See Comments)    insomnia  . Sitagliptin Other (See Comments)    constipation  . Tramadol Itching  . Diltiazem Hcl Palpitations  . Gabapentin Palpitations  . Hydralazine Rash  . Lovastatin Palpitations    Medications  reviewed.    ROS A multipoint review of systems was completed, all pertinent positives and negatives were documented in the history of present illness and remainder are negative.   BP 159/90 mmHg  Pulse 71  Temp(Src) 98.3 F (36.8 C) (Oral)  Wt 91.627 kg (202 lb)  Physical Exam Gen.: No acute distress Chest: Clear to auscultation Heart: Regular rate and rhythm Abdomen: Soft, nondistended, minimally tender to deep palpation around her ostomy site. Her ostomy remains retracted but functional in the left upper quadrant with a well-healed lateral incision site from this from her recent infections.    No results found for this or any previous visit (from the past 48 hour(s)). No results found.  Assessment/Plan:  1. Colostomy, retracted [facility]) 65 year old female has had a complicated course with numerous surgeries for her colostomy. She is ready for colostomy reversal from a surgical standpoint however continues to require medical clearance prior to being scheduled for an elective reversal. She voiced understanding. She'll keep her scheduled appointments with internal medicine and cardiology and follow up with [place] after.  2. Abdominal pain, chronic, epigastric Patient with chronic abdominal pain. We will provide her with an additional prescription today of 5 mg Versed. We will also provide her with a small prescription of Ambien to assist in her sleeping. Discussed the danger of combining these 2 medications and thus a shorter-term prescription of both were provided today. She will call back should she need more. However her goals to get her to pain management so that she can begin the process of weaning off of the narcotics.     Clayburn Pert, MD FACS General Surgeon  06/06/2015,2:32 PM

## 2015-06-08 ENCOUNTER — Telehealth: Payer: Self-pay | Admitting: General Surgery

## 2015-06-08 NOTE — Telephone Encounter (Signed)
Patient called me back. I told patient that I had spoken to Dr. Adonis Huguenin in reference to her not sleeping at night even though she took her Ambien. Dr. Adonis Huguenin stated that she would have to call her primary care doctor and ask what else she is able to take. Patient understood and had no further questions.

## 2015-06-08 NOTE — Telephone Encounter (Signed)
Called patient back and left a message with patient's sister to return my call.

## 2015-06-08 NOTE — Telephone Encounter (Signed)
Dr. Adonis Huguenin put her on a low dose of Ambien and she feels it's not helping her sleep. Could she have something stronger?

## 2015-06-12 ENCOUNTER — Telehealth: Payer: Self-pay

## 2015-06-12 NOTE — Telephone Encounter (Signed)
Cardiac Clearance has been obtained from Dr. Nehemiah Massed. Clearance is in Office note from 06/09/15.  Appointment has been made to Follow-up with Dr. Adonis Huguenin on 06/27/15 as patient would like to be seen in [facility] office.

## 2015-06-14 ENCOUNTER — Telehealth: Payer: Self-pay

## 2015-06-14 ENCOUNTER — Other Ambulatory Visit: Payer: Self-pay

## 2015-06-14 DIAGNOSIS — R1013 Epigastric pain: Secondary | ICD-10-CM

## 2015-06-14 DIAGNOSIS — K9409 Other complications of colostomy: Secondary | ICD-10-CM

## 2015-06-14 NOTE — Telephone Encounter (Signed)
Spoke with Dr. Adonis Huguenin regarding patient. Patient is to follow-up with Dr. Adonis Huguenin to discuss colostomy takedown on 06/27/15. However, she will need to have a Barium Enema prior to this appointment. Order placed. Appointment scheduled for 5/3/17at 0930am at [facility].   Patient's last Colonoscopy was completed in 2015 or before by Dr. Vira Agar at [facility]. (Patient will need a new Colonoscopy prior to surgery)  Colonoscopy scheduled with Dr. Gorin Norris on 06/19/15 at [facility] as patient does not wish to go back to Dr. Vira Agar at this time. Orders placed.

## 2015-06-15 ENCOUNTER — Other Ambulatory Visit: Payer: Self-pay | Admitting: *Deleted

## 2015-06-16 ENCOUNTER — Other Ambulatory Visit: Payer: Self-pay

## 2015-06-16 ENCOUNTER — Encounter: Payer: Self-pay | Admitting: *Deleted

## 2015-06-16 DIAGNOSIS — Z01810 Encounter for preprocedural cardiovascular examination: Secondary | ICD-10-CM

## 2015-06-16 MED ORDER — PEG 3350-KCL-NABCB-NACL-NASULF 236 G PO SOLR: 4000.0000 mL | Freq: Once | ORAL | Status: DC

## 2015-06-16 MED ORDER — NA SULFATE-K SULFATE-MG SULF 17.5-3.13-1.6 GM/177ML PO SOLN: 1.0000 | ORAL | Status: DC

## 2015-06-16 NOTE — Patient Outreach (Signed)
[facility]) Care Management   Home visit 06/15/15  [name] 1950/08/29 [id number]  [name] is an 65 y.o. female  Subjective:  Pt reports f/u with Dr. Caryl Comes 4/25, BP 130/90, MD gave rx for Oxycodone (abdominal pain at  [facility]).  Pt reports she has been watching her sodium. Pt reports f/u with Dr. Adonis Huguenin (surgeon) 4/18, to f/u again 5/9- evaluation for colostomy reversal.  Pt reports she received clearance from heart MD to have surgery.   Pt reports scheduled for colonoscopy 5/1, have not received a call yet about the prep.    Objective:   Filed Vitals:   06/15/15 1310  BP: 142/60  Pulse: 63  Resp: 16    ROS  Physical Exam  Constitutional: She is oriented to person, place, and time. She appears well-developed and well-nourished.  Cardiovascular: Regular rhythm and normal heart sounds.   Pulse 63.    Respiratory: Effort normal and breath sounds normal.  GI: Soft.  Pt has colostomy   Musculoskeletal: Normal range of motion.  Neurological: She is alert and oriented to person, place, and time.  Skin: Skin is warm and dry.  Psychiatric: She has a normal mood and affect. Her behavior is normal. Judgment and thought content normal.    Encounter Medications:  Reviewed with pt   Outpatient Encounter Prescriptions as of 06/15/2015  Medication Sig Note  . atorvastatin (LIPITOR) 40 MG tablet Take 40 mg by mouth at bedtime.   . ciprofloxacin-dexamethasone (CIPRODEX) otic suspension 1 drop. Four times  A day left eye   . DULoxetine (CYMBALTA) 30 MG capsule Take 30 mg by mouth daily.    Marland Kitchen glimepiride (AMARYL) 4 MG tablet Take 4 mg by mouth 2 (two) times daily.    Marland Kitchen levothyroxine (SYNTHROID, LEVOTHROID) 88 MCG tablet Take 88 mcg by mouth every morning.    . metoprolol succinate (TOPROL-XL) 25 MG 24 hr tablet Take 25 mg by mouth daily.   Marland Kitchen oxyCODONE-acetaminophen (ROXICET) 5-325 MG tablet Take 1 tablet by mouth every 4 (four) hours as needed for severe pain.   Marland Kitchen  telmisartan (MICARDIS) 80 MG tablet Take 80 mg by mouth daily.   . traZODone (DESYREL) 50 MG tablet Take 50 mg by mouth at bedtime. As needed.   Marland Kitchen amiodarone (PACERONE) 200 MG tablet Take 1 tablet (200 mg total) by mouth daily. (Patient not taking: Reported on 06/15/2015)   . azelastine (ASTELIN) 0.1 % nasal spray Place into both nostrils 2 (two) times daily as needed. Reported on 06/15/2015   . bisacodyl (DULCOLAX) 5 MG EC tablet Take 4 tablets (20 mg total) by mouth once. Please see surgery prep instructions (Patient not taking: Reported on 06/15/2015)   . calcium carbonate (TUMS) 500 MG chewable tablet Chew 1 tablet (200 mg of elemental calcium total) by mouth daily. (Patient not taking: Reported on 06/15/2015)   . clobetasol cream (TEMOVATE) 6.14 % Apply 1 application topically 2 (two) times daily as needed (for rash).    Marland Kitchen digoxin 62.5 MCG TABS Take 0.0625 mg by mouth daily. (Patient not taking: Reported on 06/15/2015)   . hydrALAZINE (APRESOLINE) 50 MG tablet Take 1 tablet by mouth 2 (two) times daily. 06/05/2015: Received from: [place]: Take by mouth.  . insulin glargine (LANTUS) 100 UNIT/ML injection Inject 10 Units into the skin at bedtime.   . isosorbide mononitrate (IMDUR) 60 MG 24 hr tablet Take 1 tablet by mouth daily. 06/05/2015: Received from: [facility]  System Received Sig: Take by mouth.  Marland Kitchen LANTUS SOLOSTAR 100 UNIT/ML Solostar Pen Inject 10 Units into the skin at bedtime. 06/05/2015: Received from: External Pharmacy Received Sig: INJECT 10 UNITS SUBCUTANEOUSLY NIGHTLY.  . metoprolol succinate (TOPROL-XL) 100 MG 24 hr tablet Take 1 tablet (100 mg total) by mouth 2 (two) times daily. (Patient not taking: Reported on 06/15/2015)   . nystatin (MYCOSTATIN/NYSTOP) 100000 UNIT/GM POWD Apply topically daily. Reported on 06/15/2015   . polyethylene glycol powder (GLYCOLAX/MIRALAX) powder Take 255 g by mouth once. Please see surgery prep instructions given at  appointment (Patient not taking: Reported on 06/15/2015)   . zolpidem (AMBIEN CR) 6.25 MG CR tablet Take 1 tablet (6.25 mg total) by mouth at bedtime as needed for sleep. (Patient not taking: Reported on 06/15/2015)    No facility-administered encounter medications on file as of 06/15/2015.    Functional Status:   In your present state of health, do you have any difficulty performing the following activities: 05/15/2015 03/09/2015  Hearing? N N  Vision? N Y  Difficulty concentrating or making decisions? N N  Walking or climbing stairs? Y Y  Dressing or bathing? N Y  Doing errands, shopping? N -  Preparing Food and eating ? N -  Using the Toilet? N -  In the past six months, have you accidently leaked urine? N -  Do you have problems with loss of bowel control? N -  Managing your Medications? N -  Managing your Finances? N -  Housekeeping or managing your Housekeeping? Y -    Fall/Depression Screening:    PHQ 2/9 Scores 05/02/2015  PHQ - 2 Score 0    Assessment:  Pleasant 65 year old woman, lives with daughter.                           HTN:  BP today with nurse's cuff- 142/60, with pt's BP machine- 152/62, HR 72.                                      View of pt's BP machine- one BP reading noted, 157/96.  As discussed, closer                                     Monitoring of BP needed.                           Plan:   As discussed, pt to check BP twice a week/record/ongoing compliance with Low Na+ diet             Pt to f/u at [facility] surgery center 5/1- colonoscopy, 5/3- [facility] barium enema              Pt to f/u with Dr. Adonis Huguenin 5/9- evaluation for colostomy reversal.              Plan to continue to provide community nurse case management services, next home visit 5/24.                                       THN CM Care Plan Problem One        Most Recent  Value   Care Plan Problem One  elevated BP    Role Documenting the Problem One  Care Management Coordinator   Care Plan for  Problem One  Active   THN Long Term Goal (31-90 days)  Pt's BP would be within normal limits in the next 60 days    THN Long Term Goal Start Date  05/15/15   THN CM Short Term Goal #1 (0-30 days)  Pt would monitor BP, record for the next 30 days    THN CM Short Term Goal #1 Start Date  05/15/15   [facility] CM Short Term Goal #1 Met Date  -- [not met. pt checked once, not recorded ]   Interventions for Short Term Goal #1  Provided pt with [facility] calendar to record readings.    THN CM Short Term Goal #2 (0-30 days)  Pt would monitor sodium intake for the next 30 days    THN CM Short Term Goal #2 Start Date  05/15/15   [facility] CM Short Term Goal #2 Met Date  06/15/15   Interventions for Short Term Goal #2  Provided/reviewed Emmi information on HTN-What you can do, included foods high/low in sodium    THN CM Short Term Goal #3 (0-30 days)  Pt would monitor BP twice a week, record for the next 30 days    THN CM Short Term Goal #3 Start Date  06/15/15   Interventions for Short Tern Goal #3  Reinforced  need to monitor BP, if elevated call MD.       Zara Chess.   [place] Care Management  [phone]

## 2015-06-16 NOTE — Discharge Instructions (Signed)

## 2015-06-16 NOTE — Telephone Encounter (Signed)
Patient scheduled for Barium enema on 06/21/15 at 0930am at [facility]. Patient should do clear liquids for 24 hours prior to testing.   Called patient at this time. She was given date, time, location, and instructions for all testing. She verbalized understanding and wrote everything down while we were on phone.  I explained to patient, if she has difficulty understanding instructions that I would have them available for pick-up in the [facility] office for her until noon today. She understands this as well.

## 2015-06-19 ENCOUNTER — Ambulatory Visit: Payer: PPO | Admitting: Anesthesiology

## 2015-06-19 ENCOUNTER — Ambulatory Visit
Admission: RE | Admit: 2015-06-19 | Discharge: 2015-06-19 | Disposition: A | Discharge status: Home / Self Care | Payer: PPO | Source: Ambulatory Visit | Attending: Gastroenterology | Admitting: Gastroenterology

## 2015-06-19 ENCOUNTER — Encounter
Admission: RE | Disposition: A | Discharge status: Home / Self Care | Payer: Self-pay | Source: Ambulatory Visit | Attending: Gastroenterology

## 2015-06-19 DIAGNOSIS — Z79899 Other long term (current) drug therapy: Secondary | ICD-10-CM | POA: Insufficient documentation

## 2015-06-19 DIAGNOSIS — E1122 Type 2 diabetes mellitus with diabetic chronic kidney disease: Secondary | ICD-10-CM | POA: Diagnosis not present

## 2015-06-19 DIAGNOSIS — Z888 Allergy status to other drugs, medicaments and biological substances status: Secondary | ICD-10-CM | POA: Insufficient documentation

## 2015-06-19 DIAGNOSIS — M170 Bilateral primary osteoarthritis of knee: Secondary | ICD-10-CM | POA: Diagnosis not present

## 2015-06-19 DIAGNOSIS — Z01818 Encounter for other preprocedural examination: Secondary | ICD-10-CM | POA: Diagnosis not present

## 2015-06-19 DIAGNOSIS — Z8614 Personal history of Methicillin resistant Staphylococcus aureus infection: Secondary | ICD-10-CM | POA: Insufficient documentation

## 2015-06-19 DIAGNOSIS — Z9889 Other specified postprocedural states: Secondary | ICD-10-CM | POA: Insufficient documentation

## 2015-06-19 DIAGNOSIS — M19072 Primary osteoarthritis, left ankle and foot: Secondary | ICD-10-CM | POA: Diagnosis not present

## 2015-06-19 DIAGNOSIS — Z90710 Acquired absence of both cervix and uterus: Secondary | ICD-10-CM | POA: Insufficient documentation

## 2015-06-19 DIAGNOSIS — Z6836 Body mass index (BMI) 36.0-36.9, adult: Secondary | ICD-10-CM | POA: Insufficient documentation

## 2015-06-19 DIAGNOSIS — E785 Hyperlipidemia, unspecified: Secondary | ICD-10-CM | POA: Insufficient documentation

## 2015-06-19 DIAGNOSIS — M19071 Primary osteoarthritis, right ankle and foot: Secondary | ICD-10-CM | POA: Diagnosis not present

## 2015-06-19 DIAGNOSIS — E669 Obesity, unspecified: Secondary | ICD-10-CM | POA: Insufficient documentation

## 2015-06-19 DIAGNOSIS — F419 Anxiety disorder, unspecified: Secondary | ICD-10-CM | POA: Insufficient documentation

## 2015-06-19 DIAGNOSIS — I129 Hypertensive chronic kidney disease with stage 1 through stage 4 chronic kidney disease, or unspecified chronic kidney disease: Secondary | ICD-10-CM | POA: Diagnosis not present

## 2015-06-19 DIAGNOSIS — Z87891 Personal history of nicotine dependence: Secondary | ICD-10-CM | POA: Diagnosis not present

## 2015-06-19 DIAGNOSIS — Z794 Long term (current) use of insulin: Secondary | ICD-10-CM | POA: Insufficient documentation

## 2015-06-19 DIAGNOSIS — E1140 Type 2 diabetes mellitus with diabetic neuropathy, unspecified: Secondary | ICD-10-CM | POA: Insufficient documentation

## 2015-06-19 DIAGNOSIS — E039 Hypothyroidism, unspecified: Secondary | ICD-10-CM | POA: Diagnosis not present

## 2015-06-19 DIAGNOSIS — Z8249 Family history of ischemic heart disease and other diseases of the circulatory system: Secondary | ICD-10-CM | POA: Insufficient documentation

## 2015-06-19 DIAGNOSIS — Z833 Family history of diabetes mellitus: Secondary | ICD-10-CM | POA: Diagnosis not present

## 2015-06-19 DIAGNOSIS — N189 Chronic kidney disease, unspecified: Secondary | ICD-10-CM | POA: Insufficient documentation

## 2015-06-19 DIAGNOSIS — Z933 Colostomy status: Secondary | ICD-10-CM | POA: Diagnosis not present

## 2015-06-19 HISTORY — DX: Colostomy status: Z93.3

## 2015-06-19 HISTORY — DX: Methicillin resistant Staphylococcus aureus infection, unspecified site: A49.02

## 2015-06-19 HISTORY — DX: Chronic kidney disease, unspecified: N18.9

## 2015-06-19 HISTORY — PX: FLEXIBLE SIGMOIDOSCOPY: SHX5431

## 2015-06-19 HISTORY — DX: Hypothyroidism, unspecified: E03.9

## 2015-06-19 LAB — GLUCOSE, CAPILLARY
GLUCOSE-CAPILLARY: 108 mg/dL — AB (ref 65–99)
GLUCOSE-CAPILLARY: 84 mg/dL (ref 65–99)

## 2015-06-19 SURGERY — SIGMOIDOSCOPY, FLEXIBLE
Anesthesia: Monitor Anesthesia Care | Wound class: Dirty or Infected

## 2015-06-19 MED ORDER — ACETAMINOPHEN 325 MG PO TABS: 325.0000 mg | ORAL_TABLET | ORAL | Status: DC | PRN

## 2015-06-19 MED ORDER — ACETAMINOPHEN 160 MG/5ML PO SOLN: 325.0000 mg | ORAL | Status: DC | PRN

## 2015-06-19 MED ORDER — LACTATED RINGERS IV SOLN
INTRAVENOUS | Status: DC
  Administered 2015-06-19: 08:00:00 via INTRAVENOUS

## 2015-06-19 MED ORDER — PROPOFOL 10 MG/ML IV BOLUS
INTRAVENOUS | Status: DC | PRN
  Administered 2015-06-19 (×5): 20 mg via INTRAVENOUS
  Administered 2015-06-19: 60 mg via INTRAVENOUS

## 2015-06-19 MED ORDER — STERILE WATER FOR IRRIGATION IR SOLN
Status: DC | PRN
  Administered 2015-06-19: 09:00:00

## 2015-06-19 MED ORDER — LIDOCAINE HCL (CARDIAC) 20 MG/ML IV SOLN
INTRAVENOUS | Status: DC | PRN
  Administered 2015-06-19: 40 mg via INTRAVENOUS

## 2015-06-19 SURGICAL SUPPLY — 22 items
CANISTER SUCT 1200ML W/VALVE (MISCELLANEOUS) ×4 IMPLANT
CLIP HMST 235XBRD CATH ROT (MISCELLANEOUS) IMPLANT
CLIP RESOLUTION 360 11X235 (MISCELLANEOUS)
FCP ESCP3.2XJMB 240X2.8X (MISCELLANEOUS)
FORCEPS BIOP RAD 4 LRG CAP 4 (CUTTING FORCEPS) IMPLANT
FORCEPS BIOP RJ4 240 W/NDL (MISCELLANEOUS)
FORCEPS ESCP3.2XJMB 240X2.8X (MISCELLANEOUS) IMPLANT
GOWN CVR UNV OPN BCK APRN NK (MISCELLANEOUS) ×4 IMPLANT
GOWN ISOL THUMB LOOP REG UNIV (MISCELLANEOUS) ×4
INJECTOR VARIJECT VIN23 (MISCELLANEOUS) IMPLANT
KIT DEFENDO VALVE AND CONN (KITS) IMPLANT
KIT ENDO PROCEDURE OLY (KITS) ×4 IMPLANT
MARKER SPOT ENDO TATTOO 5ML (MISCELLANEOUS) IMPLANT
PAD GROUND ADULT SPLIT (MISCELLANEOUS) IMPLANT
PROBE APC STR FIRE (PROBE) IMPLANT
SNARE SHORT THROW 13M SML OVAL (MISCELLANEOUS) IMPLANT
SNARE SHORT THROW 30M LRG OVAL (MISCELLANEOUS) IMPLANT
SNARE SNG USE RND 15MM (INSTRUMENTS) IMPLANT
SPOT EX ENDOSCOPIC TATTOO (MISCELLANEOUS)
TRAP ETRAP POLY (MISCELLANEOUS) IMPLANT
VARIJECT INJECTOR VIN23 (MISCELLANEOUS)
WATER STERILE IRR 250ML POUR (IV SOLUTION) ×4 IMPLANT

## 2015-06-19 NOTE — Anesthesia Procedure Notes (Signed)
Procedure Name: MAC Performed by: Leigha Olberding Pre-anesthesia Checklist: Patient identified, Emergency Drugs available, Suction available, Timeout performed and Patient being monitored Patient Re-evaluated:Patient Re-evaluated prior to inductionOxygen Delivery Method: Nasal cannula Placement Confirmation: positive ETCO2       

## 2015-06-19 NOTE — Transfer of Care (Signed)
Immediate Anesthesia Transfer of Care Note  Patient: [name]  Procedure(s) Performed: Procedure(s) with comments: FLEXIBLE SIGMOIDOSCOPY - UNABLE TO ACCESS OSTOMY SITE FOR ACCESS INTO COLON  Patient Location: PACU  Anesthesia Type: MAC  Level of Consciousness: awake, alert  and patient cooperative  Airway and Oxygen Therapy: Patient Spontanous Breathing and Patient connected to supplemental oxygen  Post-op Assessment: Post-op Vital signs reviewed, Patient's Cardiovascular Status Stable, Respiratory Function Stable, Patent Airway and No signs of Nausea or vomiting  Post-op Vital Signs: Reviewed and stable  Complications: No apparent anesthesia complications

## 2015-06-19 NOTE — H&P (Signed)
[facility]  [address] [place], [place] [address] Phone: [phone] Fax : [phone]  Primary Care Physician:  Adin Hector, MD Primary Gastroenterologist:  Dr. Mcparland Norris  Pre-Procedure History & Physical: HPI:  [name] is a 65 y.o. female is here for an colonoscopy.   Past Medical History  Diagnosis Date  . Diabetes mellitus without complication ([place])   . GI bleed   . Aortic valve stenosis   . Hyperlipidemia   . Hypertension   . Diverticulitis large intestine   . Obesity   . Shortness of breath dyspnea   . Anxiety   . Neuropathy in diabetes ([place])   . Anemia   . Vertigo   . Bowel obstruction ([place])   . Hypothyroidism   . Arthritis     feet, legs  . CKD (chronic kidney disease)   . Colostomy in place [facility])   . MRSA (methicillin resistant Staphylococcus aureus)     at abdominal wound.  Jan 2017.  Treated.     Past Surgical History  Procedure Laterality Date  . Colectomy    . Laparotomy closure of cecal perforation  05/09/2013    Dr. Marina Gravel  . Tonsillectomy    . Back surgery      spur frmoved from lower back  . Cardiac catheterization    . Abdominal hysterectomy    . Laparotomy N/A 02/06/2015    Procedure: Laparotomy, reduction of incarcerated parastomal hernia, repair of parastomal hernia with mesh;  Surgeon: Sherri Rad, MD;  Location: [facility] ORS;  Service: General;  Laterality: N/A;  . Debridement of abdominal wall abscess N/A 02/22/2015    Procedure: DEBRIDEMENT OF ABDOMINAL WALL ABSCESS;  Surgeon: Clayburn Pert, MD;  Location: [facility] ORS;  Service: General;  Laterality: N/A;  . Excision mass abdominal N/A 02/24/2015    Procedure: EXCISION MASS ABDOMINAL  / [place];  Surgeon: Clayburn Pert, MD;  Location: [facility] ORS;  Service: General;  Laterality: N/A;  . Application of wound vac N/A 02/24/2015    Procedure: APPLICATION OF WOUND VAC;  Surgeon: Clayburn Pert, MD;  Location: [facility] ORS;  Service: General;  Laterality: N/A;  . Excision mass  abdominal N/A 02/26/2015    Procedure: EXCISION MASS ABDOMINAL/wash out;  Surgeon: Clayburn Pert, MD;  Location: [facility] ORS;  Service: General;  Laterality: N/A;  . Application of wound vac N/A 02/26/2015    Procedure: APPLICATION OF WOUND VAC;  Surgeon: Clayburn Pert, MD;  Location: [facility];  Service: General;  Laterality: N/A;  . Wound debridement N/A 03/03/2015    Procedure: DEBRIDEMENT ABDOMINAL WOUND;  Surgeon: Florene Glen, MD;  Location: [facility] ORS;  Service: General;  Laterality: N/A;  . Wound debridement N/A 03/09/2015    Procedure: DEBRIDEMENT ABDOMINAL WOUND;  Surgeon: Florene Glen, MD;  Location: [facility] ORS;  Service: General;  Laterality: N/A;    Prior to Admission medications   Medication Sig Start Date End Date Taking? Authorizing Provider  atorvastatin (LIPITOR) 40 MG tablet Take 40 mg by mouth at bedtime. 07/22/14  Yes Historical Provider, MD  azelastine (ASTELIN) 0.1 % nasal spray Place into both nostrils 2 (two) times daily as needed. Reported on 06/15/2015   Yes Historical Provider, MD  calcium carbonate (TUMS) 500 MG chewable tablet Chew 1 tablet (200 mg of elemental calcium total) by mouth daily. Patient taking differently: Chew 1 tablet by mouth daily as needed.  02/15/15  Yes Nicholes Mango, MD  ciprofloxacin-dexamethasone (CIPRODEX) otic suspension 1 drop. Four times  A day left eye   Yes Historical Provider, MD  DULoxetine (CYMBALTA) 30 MG capsule Take 30 mg by mouth daily.  08/12/14  Yes Historical Provider, MD  glimepiride (AMARYL) 4 MG tablet Take 4 mg by mouth 2 (two) times daily. 1 tab AM, 1/2 tab PM 08/12/14  Yes Historical Provider, MD  insulin glargine (LANTUS) 100 UNIT/ML injection Inject 10 Units into the skin at bedtime.   Yes Historical Provider, MD  levothyroxine (SYNTHROID, LEVOTHROID) 88 MCG tablet Take 88 mcg by mouth every morning.    Yes Historical Provider, MD  metoprolol succinate (TOPROL-XL) 25 MG 24 hr tablet Take 25 mg by mouth daily.   Yes Historical  Provider, MD  oxyCODONE-acetaminophen (ROXICET) 5-325 MG tablet Take 1 tablet by mouth every 4 (four) hours as needed for severe pain. 06/06/15  Yes Clayburn Pert, MD  telmisartan (MICARDIS) 80 MG tablet Take 80 mg by mouth daily.   Yes Historical Provider, MD  traZODone (DESYREL) 50 MG tablet Take 50 mg by mouth at bedtime. As needed.   Yes Historical Provider, MD  clobetasol cream (TEMOVATE) 6.07 % Apply 1 application topically 2 (two) times daily as needed (for rash).     Historical Provider, MD  metoprolol succinate (TOPROL-XL) 100 MG 24 hr tablet Take 1 tablet (100 mg total) by mouth 2 (two) times daily. Patient not taking: Reported on 06/19/2015 02/15/15   Hubbard Robinson, MD  Na Sulfate-K Sulfate-Mg Sulf (SUPREP BOWEL PREP) SOLN Take 1 kit by mouth as directed. 06/16/15   Lucilla Lame, MD  nystatin (MYCOSTATIN/NYSTOP) 100000 UNIT/GM POWD Apply topically daily. Reported on 06/15/2015    Historical Provider, MD  polyethylene glycol (GOLYTELY) 236 g solution Take 4,000 mLs by mouth once. 06/16/15   Lucilla Lame, MD  polyethylene glycol powder (GLYCOLAX/MIRALAX) powder Take 255 g by mouth once. Please see surgery prep instructions given at appointment Patient not taking: Reported on 06/15/2015 09/19/14   Sherri Rad, MD  zolpidem (AMBIEN CR) 6.25 MG CR tablet Take 1 tablet (6.25 mg total) by mouth at bedtime as needed for sleep. Patient not taking: Reported on 06/15/2015 06/06/15   Clayburn Pert, MD    Allergies as of 06/14/2015 - Review Complete 06/06/2015  Allergen Reaction Noted  . Buspirone Other (See Comments) 02/16/2015  . Citalopram Other (See Comments) 02/16/2015  . Lisinopril Cough 02/16/2015  . Metformin Diarrhea 02/16/2015  . Pravastatin Other (See Comments) 02/16/2015  . Sitagliptin Other (See Comments) 02/16/2015  . Tramadol Itching 02/16/2015  . Diltiazem hcl Palpitations 02/16/2015  . Gabapentin Palpitations 02/16/2015  . Hydralazine Rash 06/05/2015  . Lovastatin Palpitations  02/16/2015    Family History  Problem Relation Age of Onset  . Diabetes Mother   . Hypertension Father     Social History   Social History  . Marital Status: Divorced    Spouse Name: N/A  . Number of Children: N/A  . Years of Education: N/A   Occupational History  . Not on file.   Social History Main Topics  . Smoking status: Former Smoker -- 0.25 packs/day for 1 years    Types: Cigarettes    Quit date: 04/18/2013  . Smokeless tobacco: Never Used  . Alcohol Use: No  . Drug Use: No  . Sexual Activity: Not on file   Other Topics Concern  . Not on file   Social History Narrative    Review of Systems: See HPI, otherwise negative ROS  Physical Exam: BP 131/107 mmHg  Pulse 74  Temp(Src)  97.7 F (36.5 C) (Temporal)  Resp 18  Ht '5\' 3"'  (1.6 m)  Wt 204 lb (92.534 kg)  BMI 36.15 kg/m2  SpO2 98% General:   Alert,  pleasant and cooperative in NAD Head:  Normocephalic and atraumatic. Neck:  Supple; no masses or thyromegaly. Lungs:  Clear throughout to auscultation.    Heart:  Regular rate and rhythm. Abdomen:  Soft, nontender and nondistended. Colostomy in place .   Neurologic:  Alert and  oriented x4;  grossly normal neurologically.  Impression/Plan: [name] is here for an colonoscopy to be performed for pre-op  Risks, benefits, limitations, and alternatives regarding  colonoscopy have been reviewed with the patient.  Questions have been answered.  All parties agreeable.   Ollen Bowl, MD  06/19/2015, 8:20 AM

## 2015-06-19 NOTE — Anesthesia Postprocedure Evaluation (Signed)
Anesthesia Post Note  Patient: [name]  Procedure(s) Performed: Procedure(s): [place]  Patient location during evaluation: PACU Anesthesia Type: MAC Level of consciousness: awake and alert and oriented Pain management: satisfactory to patient Vital Signs Assessment: post-procedure vital signs reviewed and stable Respiratory status: spontaneous breathing, nonlabored ventilation and respiratory function stable Cardiovascular status: blood pressure returned to baseline and stable Postop Assessment: Adequate PO intake and No signs of nausea or vomiting Anesthetic complications: no    Raliegh Ip

## 2015-06-19 NOTE — Anesthesia Preprocedure Evaluation (Signed)
Anesthesia Evaluation  Patient identified by MRN, date of birth, ID band  Reviewed: Allergy & Precautions, H&P , NPO status , Patient's Chart, lab work & pertinent test results  Airway Mallampati: III  TM Distance: >3 FB Neck ROM: full    Dental no notable dental hx.    Pulmonary former smoker,    Pulmonary exam normal        Cardiovascular hypertension, + Valvular Problems/Murmurs AS  Rhythm:irregular Rate:Normal + Systolic murmurs    Neuro/Psych    GI/Hepatic   Endo/Other  diabetesHypothyroidism Morbid obesity  Renal/GU Renal disease     Musculoskeletal   Abdominal   Peds  Hematology   Anesthesia Other Findings   Reproductive/Obstetrics                             Anesthesia Physical Anesthesia Plan  ASA: III  Anesthesia Plan: MAC   Post-op Pain Management:    Induction:   Airway Management Planned:   Additional Equipment:   Intra-op Plan:   Post-operative Plan:   Informed Consent: I have reviewed the patients History and Physical, chart, labs and discussed the procedure including the risks, benefits and alternatives for the proposed anesthesia with the patient or authorized representative who has indicated his/her understanding and acceptance.     Plan Discussed with: CRNA  Anesthesia Plan Comments:         Anesthesia Quick Evaluation

## 2015-06-19 NOTE — Op Note (Signed)
[facility] Gastroenterology Patient Name: [name] Procedure Date: 06/19/2015 8:14 AM MRN: [id number] Account #: [id number] Date of Birth: 14-Oct-1950 Admit Type: Outpatient Age: 65 Room: [facility] OR ROOM 01 Gender: Female Note Status: Finalized Procedure:            Colonoscopy Indications:          Preoperative assessment Providers:            Lucilla Lame, MD Referring MD:         Ramonita Lab, MD (Referring MD) Medicines:            Propofol per Anesthesia Complications:        No immediate complications. Procedure:            Pre-Anesthesia Assessment:                       - Prior to the procedure, a History and Physical was                        performed, and patient medications and allergies were                        reviewed. [name] tolerance of previous                        anesthesia was also reviewed. The risks and benefits of                        the procedure and the sedation options and risks were                        discussed with the patient. All questions were                        answered, and informed consent was obtained. Prior                        Anticoagulants: The patient has taken no previous                        anticoagulant or antiplatelet agents. ASA Grade                        Assessment: III - A patient with severe systemic                        disease. After reviewing the risks and benefits, the                        patient was deemed in satisfactory condition to undergo                        the procedure.                       After obtaining informed consent, the colonoscope was                        passed under direct vision. Throughout the procedure,  the patient's blood pressure, pulse, and oxygen                        saturations were monitored continuously. The Olympus CF                        H180AL colonoscope (S#: [id number]) was introduced through   the anus and advanced to the the sigmoid colon to                        examine an anastomosis. This was the intended extent.                        The colonoscopy was performed without difficulty. The                        patient tolerated the procedure well. The quality of                        the bowel preparation was poor. Findings:      The perianal and digital rectal examinations were normal.      The rectum, recto-sigmoid colon and sigmoid colon appeared normal.      Ostomy was stenotic without ability to pass scope. Impression:           - Preparation of the colon was poor.                       - The rectum, recto-sigmoid colon and sigmoid colon are                        normal.                       - Ostomy was stenotic without ability to pass scope.                       - No specimens collected. Recommendation:       - Discharge patient to home. Procedure Code(s):    --- Professional ---                       [phone], 53, Colonoscopy, flexible; diagnostic, including                        collection of specimen(s) by brushing or washing, when                        performed (separate procedure) Diagnosis Code(s):    --- Professional ---                       [id number], Encounter for other preprocedural examination CPT copyright 2016 American Medical Association. All rights reserved. The codes documented in this report are preliminary and upon coder review may  be revised to meet current compliance requirements. Lucilla Lame, MD 06/19/2015 8:46:19 AM This report has been signed electronically. Number of Addenda: 0 Note Initiated On: 06/19/2015 8:14 AM Total Procedure Duration: 0 hours 6 minutes 28 seconds       [facility]

## 2015-06-20 ENCOUNTER — Ambulatory Visit: Payer: PPO | Admitting: Dietician

## 2015-06-20 ENCOUNTER — Encounter: Payer: Self-pay | Admitting: Gastroenterology

## 2015-06-21 ENCOUNTER — Ambulatory Visit
Admission: RE | Admit: 2015-06-21 | Discharge: 2015-06-21 | Disposition: A | Discharge status: Home / Self Care | Payer: PPO | Source: Ambulatory Visit | Attending: General Surgery | Admitting: General Surgery

## 2015-06-21 DIAGNOSIS — K9409 Other complications of colostomy: Secondary | ICD-10-CM

## 2015-06-27 ENCOUNTER — Ambulatory Visit (INDEPENDENT_AMBULATORY_CARE_PROVIDER_SITE_OTHER): Payer: PPO | Admitting: General Surgery

## 2015-06-27 ENCOUNTER — Other Ambulatory Visit: Payer: Self-pay

## 2015-06-27 ENCOUNTER — Encounter: Payer: Self-pay | Admitting: General Surgery

## 2015-06-27 VITALS — BP 152/83 | HR 68 | Temp 99.0°F | Ht 63.0 in | Wt 203.0 lb

## 2015-06-27 DIAGNOSIS — R0681 Apnea, not elsewhere classified: Secondary | ICD-10-CM

## 2015-06-27 DIAGNOSIS — R0609 Other forms of dyspnea: Secondary | ICD-10-CM

## 2015-06-27 DIAGNOSIS — K9409 Other complications of colostomy: Secondary | ICD-10-CM

## 2015-06-27 MED ORDER — POLYETHYLENE GLYCOL 3350 17 G PO PACK: 119.0000 g | PACK | Freq: Once | ORAL | Status: DC

## 2015-06-27 MED ORDER — ERYTHROMYCIN BASE 500 MG PO TABS: 1000.0000 mg | ORAL_TABLET | Freq: Three times a day (TID) | ORAL | Status: DC

## 2015-06-27 MED ORDER — NEOMYCIN SULFATE 500 MG PO TABS: 1000.0000 mg | ORAL_TABLET | Freq: Three times a day (TID) | ORAL | Status: DC

## 2015-06-27 MED ORDER — BISACODYL 5 MG PO TBEC: 10.0000 mg | DELAYED_RELEASE_TABLET | Freq: Once | ORAL | Status: DC

## 2015-06-27 NOTE — Patient Instructions (Addendum)
We have seen you today to speak about putting your colon back together and removing your colostomy. We will arrange for this surgery to be done on 07/12/15 at [facility] by Dr.Woodham.  You will need to complete a bowel prep prior to your surgery, please see the information sheet provided today for your directions.  Also, there will be 2 different antibiotics that you will need to take the day of your bowel prep: Neomycin and Erythromycin. You will take 2 tablets of each medication 3 times on the day of your bowel prep- 8am, 2pm, and 8pm.  Please see the (blue) Pre-care sheet for the details about your scheduled surgery.

## 2015-06-27 NOTE — Progress Notes (Signed)
Outpatient Surgical Follow Up  06/27/2015  [name] is an 65 y.o. female.   Chief Complaint  Patient presents with  . Other    Discuss Colostomy Reversal    HPI: 65 year old female returns to clinic today to discuss planning for colostomy reversal. Patient reports that she was last seen she has done quite well and denies any current real complaints other than the occasional abdominal wall soreness. Her ostomy site has remained retracted but is continued to function well. Gastrologist was unable to pass the colonoscope through the strictured opening however a barium study was performed with the rectum and the ostomy site which showed no evidence of stricture or leak. She is very excited about having her ostomy reversed although appropriately scared as well. She denies any fevers, chills, nausea, vomiting, chest pain, shortness of breath currently.  Past Medical History  Diagnosis Date  . Diabetes mellitus without complication ([place])   . GI bleed   . Aortic valve stenosis   . Hyperlipidemia   . Hypertension   . Diverticulitis large intestine   . Obesity   . Shortness of breath dyspnea   . Anxiety   . Neuropathy in diabetes ([place])   . Anemia   . Vertigo   . Bowel obstruction ([place])   . Hypothyroidism   . Arthritis     feet, legs  . CKD (chronic kidney disease)   . Colostomy in place [facility])   . MRSA (methicillin resistant Staphylococcus aureus)     at abdominal wound.  Jan 2017.  Treated.     Past Surgical History  Procedure Laterality Date  . Colectomy    . Laparotomy closure of cecal perforation  05/09/2013    Dr. Marina Gravel  . Tonsillectomy    . Back surgery      spur frmoved from lower back  . Cardiac catheterization    . Abdominal hysterectomy    . Laparotomy N/A 02/06/2015    Procedure: Laparotomy, reduction of incarcerated parastomal hernia, repair of parastomal hernia with mesh;  Surgeon: Sherri Rad, MD;  Location: [facility] ORS;  Service: General;  Laterality: N/A;  .  Debridement of abdominal wall abscess N/A 02/22/2015    Procedure: DEBRIDEMENT OF ABDOMINAL WALL ABSCESS;  Surgeon: Clayburn Pert, MD;  Location: [facility] ORS;  Service: General;  Laterality: N/A;  . Excision mass abdominal N/A 02/24/2015    Procedure: EXCISION MASS ABDOMINAL  / [place];  Surgeon: Clayburn Pert, MD;  Location: [facility] ORS;  Service: General;  Laterality: N/A;  . Application of wound vac N/A 02/24/2015    Procedure: APPLICATION OF WOUND VAC;  Surgeon: Clayburn Pert, MD;  Location: [facility] ORS;  Service: General;  Laterality: N/A;  . Excision mass abdominal N/A 02/26/2015    Procedure: EXCISION MASS ABDOMINAL/wash out;  Surgeon: Clayburn Pert, MD;  Location: [facility];  Service: General;  Laterality: N/A;  . Application of wound vac N/A 02/26/2015    Procedure: APPLICATION OF WOUND VAC;  Surgeon: Clayburn Pert, MD;  Location: [facility];  Service: General;  Laterality: N/A;  . Wound debridement N/A 03/03/2015    Procedure: DEBRIDEMENT ABDOMINAL WOUND;  Surgeon: Florene Glen, MD;  Location: [facility] ORS;  Service: General;  Laterality: N/A;  . Wound debridement N/A 03/09/2015    Procedure: DEBRIDEMENT ABDOMINAL WOUND;  Surgeon: Florene Glen, MD;  Location: [facility] OR[facility];  Service: General;  Laterality: N/A;  . Flexible sigmoidoscopy  06/19/2015    Procedure: FLEXIBLE SIGMOIDOSCOPY;  Surgeon: Lucilla Lame, MD;  Location[facility];  Service: Endoscopy;;  UNABLE TO ACCESS OSTOMY SITE FOR ACCESS INTO COLON    Family History  Problem Relation Age of Onset  . Diabetes Mother   . Hypertension Father     Social History:  reports that she quit smoking about 2 years ago. Her smoking use included Cigarettes. She has a .25 pack-year smoking history. She has never used smokeless tobacco. She reports that she does not drink alcohol or use illicit drugs.  Allergies:  Allergies  Allergen Reactions  . Buspirone Other (See Comments)    Weakness  . Citalopram Other (See Comments)  . Lisinopril  Cough  . Metformin Diarrhea    At 1000 mg dose  . Pravastatin Other (See Comments)    insomnia  . Sitagliptin Other (See Comments)    constipation  . Tramadol Itching  . Diltiazem Hcl Palpitations  . Gabapentin Palpitations  . Hydralazine Rash  . Lovastatin Palpitations    Medications reviewed.    ROS A multipoint review of systems was completed. All pertinent positives and negatives are documented within the history of present illness and remainder are negative.   BP 152/83 mmHg  Pulse 68  Temp(Src) 99 F (37.2 C) (Oral)  Ht 5\' 3"  (1.6 m)  Wt 92.08 kg (203 lb)  BMI 35.97 kg/m2  Physical Exam Gen.: No acute distress Neck: Supple and nontender Chest: Clear to auscultation Heart: Regular rhythm Abdomen: Very large, soft, nontender. Multiple folds of abdominal wall fat. Ostomy bag in place to left upper quadrant.    No results found for this or any previous visit (from the past 48 hour(s)). No results found.  Assessment/Plan:  1. Breathlessness on exertion This is much improved. She has medical clearance for surgery.  2. 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 She is here today to discuss takedown of her colostomy. The procedure itself was discussed in detail To include the risks, benefits, alternatives. The primary risks include pain, bleeding, infection, damage to any of the remaining intestines, possible need for open procedure or additional procedures including but not limited to bowel resection and anastomosis. The benefits would be restoring intestinal continuity and ability to remove her colostomy. Alternatives would be to do nothing and to continue on with her end colostomy. Patient voiced understanding and desires to proceed with surgery. Plan to proceed with an attempted laparoscopic but possible open colostomy reversal on Wednesday, May 24.     Clayburn Pert, MD FACS General Surgeon  06/27/2015,3:20 PM

## 2015-06-28 ENCOUNTER — Telehealth: Payer: Self-pay | Admitting: General Surgery

## 2015-06-28 NOTE — Telephone Encounter (Signed)
Pt advised of pre op date/time and sx date. Sx: 07/12/15 with Dr Adonis Huguenin, Dr Genevive Bi to assist--Laparoscopic colostomy reversal (possible open) Pre op: 07/06/15 @ 12:30pm--Office.   Patient made aware to call [phone], between 1-3:00pm the day before surgery, to find out what time to arrive.

## 2015-07-03 ENCOUNTER — Ambulatory Visit: Payer: PPO | Attending: Pain Medicine | Admitting: Pain Medicine

## 2015-07-03 ENCOUNTER — Encounter: Payer: Self-pay | Admitting: Pain Medicine

## 2015-07-03 VITALS — BP 171/93 | HR 70 | Temp 98.2°F | Resp 18 | Ht 63.0 in | Wt 203.0 lb

## 2015-07-03 DIAGNOSIS — Z933 Colostomy status: Secondary | ICD-10-CM | POA: Diagnosis not present

## 2015-07-03 DIAGNOSIS — Z0189 Encounter for other specified special examinations: Secondary | ICD-10-CM | POA: Insufficient documentation

## 2015-07-03 DIAGNOSIS — K5732 Diverticulitis of large intestine without perforation or abscess without bleeding: Secondary | ICD-10-CM | POA: Diagnosis not present

## 2015-07-03 DIAGNOSIS — M25551 Pain in right hip: Secondary | ICD-10-CM | POA: Insufficient documentation

## 2015-07-03 DIAGNOSIS — R0609 Other forms of dyspnea: Secondary | ICD-10-CM | POA: Insufficient documentation

## 2015-07-03 DIAGNOSIS — K4030 Unilateral inguinal hernia, with obstruction, without gangrene, not specified as recurrent: Secondary | ICD-10-CM | POA: Insufficient documentation

## 2015-07-03 DIAGNOSIS — G8929 Other chronic pain: Secondary | ICD-10-CM | POA: Diagnosis not present

## 2015-07-03 DIAGNOSIS — E039 Hypothyroidism, unspecified: Secondary | ICD-10-CM | POA: Insufficient documentation

## 2015-07-03 DIAGNOSIS — M25561 Pain in right knee: Secondary | ICD-10-CM | POA: Diagnosis not present

## 2015-07-03 DIAGNOSIS — M25559 Pain in unspecified hip: Secondary | ICD-10-CM

## 2015-07-03 DIAGNOSIS — M79606 Pain in leg, unspecified: Secondary | ICD-10-CM

## 2015-07-03 DIAGNOSIS — N051 Unspecified nephritic syndrome with focal and segmental glomerular lesions: Secondary | ICD-10-CM | POA: Insufficient documentation

## 2015-07-03 DIAGNOSIS — Z5181 Encounter for therapeutic drug level monitoring: Secondary | ICD-10-CM

## 2015-07-03 DIAGNOSIS — M545 Low back pain: Secondary | ICD-10-CM

## 2015-07-03 DIAGNOSIS — E034 Atrophy of thyroid (acquired): Secondary | ICD-10-CM | POA: Diagnosis not present

## 2015-07-03 DIAGNOSIS — M25552 Pain in left hip: Secondary | ICD-10-CM | POA: Diagnosis not present

## 2015-07-03 DIAGNOSIS — E1142 Type 2 diabetes mellitus with diabetic polyneuropathy: Secondary | ICD-10-CM | POA: Insufficient documentation

## 2015-07-03 DIAGNOSIS — E785 Hyperlipidemia, unspecified: Secondary | ICD-10-CM | POA: Diagnosis not present

## 2015-07-03 DIAGNOSIS — M5442 Lumbago with sciatica, left side: Secondary | ICD-10-CM

## 2015-07-03 DIAGNOSIS — M79605 Pain in left leg: Secondary | ICD-10-CM

## 2015-07-03 DIAGNOSIS — L409 Psoriasis, unspecified: Secondary | ICD-10-CM | POA: Insufficient documentation

## 2015-07-03 DIAGNOSIS — M792 Neuralgia and neuritis, unspecified: Secondary | ICD-10-CM | POA: Insufficient documentation

## 2015-07-03 DIAGNOSIS — I471 Supraventricular tachycardia: Secondary | ICD-10-CM | POA: Insufficient documentation

## 2015-07-03 DIAGNOSIS — M069 Rheumatoid arthritis, unspecified: Secondary | ICD-10-CM | POA: Diagnosis not present

## 2015-07-03 DIAGNOSIS — M791 Myalgia: Secondary | ICD-10-CM | POA: Diagnosis not present

## 2015-07-03 DIAGNOSIS — K5660 Unspecified intestinal obstruction: Secondary | ICD-10-CM | POA: Diagnosis not present

## 2015-07-03 DIAGNOSIS — M5441 Lumbago with sciatica, right side: Secondary | ICD-10-CM

## 2015-07-03 DIAGNOSIS — M25562 Pain in left knee: Secondary | ICD-10-CM

## 2015-07-03 DIAGNOSIS — Z79891 Long term (current) use of opiate analgesic: Secondary | ICD-10-CM | POA: Insufficient documentation

## 2015-07-03 DIAGNOSIS — Z79899 Other long term (current) drug therapy: Secondary | ICD-10-CM | POA: Diagnosis not present

## 2015-07-03 DIAGNOSIS — I129 Hypertensive chronic kidney disease with stage 1 through stage 4 chronic kidney disease, or unspecified chronic kidney disease: Secondary | ICD-10-CM | POA: Diagnosis not present

## 2015-07-03 DIAGNOSIS — F419 Anxiety disorder, unspecified: Secondary | ICD-10-CM | POA: Insufficient documentation

## 2015-07-03 DIAGNOSIS — F1190 Opioid use, unspecified, uncomplicated: Secondary | ICD-10-CM | POA: Insufficient documentation

## 2015-07-03 DIAGNOSIS — Z87891 Personal history of nicotine dependence: Secondary | ICD-10-CM | POA: Insufficient documentation

## 2015-07-03 DIAGNOSIS — R109 Unspecified abdominal pain: Secondary | ICD-10-CM | POA: Diagnosis present

## 2015-07-03 DIAGNOSIS — M7918 Myalgia, other site: Secondary | ICD-10-CM

## 2015-07-03 DIAGNOSIS — M79604 Pain in right leg: Secondary | ICD-10-CM

## 2015-07-03 DIAGNOSIS — D649 Anemia, unspecified: Secondary | ICD-10-CM | POA: Insufficient documentation

## 2015-07-03 DIAGNOSIS — E538 Deficiency of other specified B group vitamins: Secondary | ICD-10-CM | POA: Insufficient documentation

## 2015-07-03 DIAGNOSIS — M542 Cervicalgia: Secondary | ICD-10-CM | POA: Diagnosis not present

## 2015-07-03 NOTE — Progress Notes (Signed)
Patient's Name: [name]  Patient type: New patient  MRN: [id number]  Service setting: Ambulatory outpatient  DOB: 03-Jan-1951  Location: [facility]  DOS: 07/03/2015  Primary Care Physician: Tama High III, MD  Note by: Kathlen Brunswick. Dossie Arbour, M.D, DABA, DABAPM, DABPM, DABIPP, FIPP  Referring Physician: Jules Husbands, MD  Specialty: Board-Certified Interventional Pain Management     Primary Reason(s) for Visit: Initial Patient Evaluation CC: Abdominal Pain   HPI  [name] is a 65 y.o. year old, female patient, who comes today for an initial evaluation. She has Diverticulitis of colon; Obesity; Small bowel obstruction ([place]); Absolute anemia; Aortic heart valve narrowing; Arthritis; B12 deficiency; Benign essential HTN; Chronic kidney disease (CKD), stage III (moderate); Diverticulitis; Focal and segmental hyalinosis; Bleeding gastrointestinal; Acquired atrophy of thyroid; Abnormal presence of protein in urine; Psoriasis; Paroxysmal supraventricular tachycardia ([place]); Breathlessness on exertion; Type 2 diabetes mellitus ([place]); Colostomy, retracted ([place]); Preop examination; Chronic pain; Chronic abdominal pain (Location of Primary Source of Pain) (Bilateral) (L>R); Long term current use of opiate analgesic; Long term prescription opiate use; Opiate use; Encounter for therapeutic drug level monitoring; Encounter for pain management planning; Neurogenic pain; Musculoskeletal pain; Visceral abdominal pain; Diabetic peripheral neuropathy ([place]); Chronic low back pain (Location of Secondary source of pain) (Bilateral) (L>R); Chronic lower extremity pain (Location of Tertiary source of pain) (Bilateral) (R>L); Chronic hip pain (Bilateral) (L>R); Chronic knee pain (Bilateral) (L>R); and Chronic neck pain (Bilateral) (L>R) on her problem list.. Her primarily concern today is the Abdominal Pain   Pain Assessment: Self-Reported Pain Score: 8 , clinically she looks like a  2/10. Reported level of pain is not compatible with clinical observations. This symptom exaggeration may be due to malingering, an emotional response, Somatic Symptom Disorder, or a lack of understanding on how the pain scale works. Pain Type: Chronic pain Pain Location: Abdomen Pain Descriptors / Indicators: Aching, Sharp Pain Frequency: Constant  Onset and Duration: Sudden, Date of onset: 2 years ago and Present longer than 3 months. The patient indicates that it started with a surgery where she had a perforated diverticulitis that required emergency surgery. She ended up with a colostomy and since then she has had pain. Cause of pain: Surgery Severity: Getting worse, NAS-11 at its worse: 10/10 and NAS-11 at its best: 7-8/10 Timing: Not influenced by the time of the day Aggravating Factors: Bending and Prolonged standing Alleviating Factors: The patient denies any type of alleviating factors. Associated Problems: Pain that does not allow patient to sleep Quality of Pain: Aching, Throbbing and Uncomfortable Previous Examinations or Tests: The patient denies Biopsy, bone scans, CT scans, CT myelograms, discograms, nerve conduction test, endoscopies, epidurograms, MRI scans, myelograms, nerve blocks, spinal taps, x-rays, nerve conduction test, neurological evaluations, neurosurgical evaluation, orthopedic evaluations, chiropractic evaluation, and psychiatric evaluations. Previous Treatments: The patient denies Biofeedback, chiropractic manipulations, cryo-analgesia, epidural steroid injections, facet blocks, hypnotherapy, morphine pump, narcotic medications, physical therapy, pool exercises, radiofrequency, relaxation therapy, spinal cord stimulation, steroid treatments by mouth, stretching exercises, the use of a TENS unit, traction, or trigger point injections.  The patient comes in today clinics today indicating that everything started around 2015 when she had an emergency surgery for a  perforated diverticulitis. In 2016 she had another hernia surgery and she is actually scheduled to have reversal of her colostomy on 07/12/2015. She indicates that the pain started after she had her colostomy put in. The patient indicates that her primary pain is in the lower abdominal area  with the left side being worst on the right. This is the area where her colostomy is located. Her second worst pain is the lower back with the left being worst on the right. The third pain is described to be in the lower extremities but the right being worst on the left. The pain goes down the right lower extremity to the top of the foot in what seems to be an L5 dermatomal distribution. In the case of the left lower extremity goes down to the top of the foot and the small toe in what appears to be an L5/S1 dermatomal distribution. Following this is her hip pain which is worse on the left than on the right. Next is the knee pain again with the left side being worst on the right. Finally she indicates that she also has some neck pain that is bilateral but with the left being worst on the right. She indicates having had some cervical spine surgery in 2002. She indicates that this last pain is intermittent.  Historic Controlled Substance Pharmacotherapy Review  Previously Prescribed Opioids: Oxycodone IR 10 mg every 6 hours (40 mg/day) Currently Prescribed Analgesic: Oxycodone IR 10 mg every 6 hours (40 mg/day) Medications: The patient did not comply with request to bring medications to the appointment MME/day: 60 mg/day Pharmacodynamics: Analgesic Effect: More than 50% Activity Facilitation: Medication(s) allow patient to sit, stand, walk, and do the basic ADLs Perceived Effectiveness: Described as relatively effective, allowing for increase in activities of daily living (ADL) Side-effects or Adverse reactions: None reported Historical Background Evaluation: [place] PDMP: Five (5) year initial data search conducted. No  abnormal patterns identified [place] [employer] Offender Public Information: Non-contributory Historical Hospital-associated UDS Results:  No results found for: THCU, COCAINSCRNUR, PCPSCRNUR, MDMA, AMPHETMU, METHADONE, ETOH UDS Results: No UDS results available at this time UDS Interpretation: N/A Medication Assessment Form: Not applicable. Initial evaluation. The patient has not received any medications from our practice Treatment compliance: Not applicable. Initial evaluation Risk Assessment: Aberrant Behavior: None observed or detected today Opioid Fatal Overdose Risk Factors: None identified today Non-fatal overdose hazard ratio (HR): Calculation deferred Fatal overdose hazard ratio (HR): Calculation deferred Substance Use Disorder (SUD) Risk Level: Pending results of Medical Psychology Evaluation for SUD Opioid Risk Tool (ORT) Score: Total Score: 1 Low Risk for SUD (Score <3) Depression Scale Score: PHQ-2: PHQ-2 Total Score: 0 No depression (0) PHQ-9: PHQ-9 Total Score: 0 No depression (0-4)  Pharmacologic Plan: Pending ordered tests and/or consults  Meds  The patient has a current medication list which includes the following prescription(s): atorvastatin, duloxetine, glimepiride, insulin glargine, levothyroxine, metoprolol succinate, telmisartan, and trazodone.  ROS  Cardiovascular History: Hypertension and Heart murmur Pulmonary or Respiratory History: Negative for bronchial asthma, emphysema, chronic smoking, chronic bronchitis, sarcoidosis, tuberculosis or sleep apena Neurological History: Negative for epilepsy, stroke, urinary or fecal inontinence, spina bifida or tethered cord syndrome Review of Past Neurological Studies: No results found for this or any previous visit. Psychological-Psychiatric History: Negative for anxiety, depression, schizophrenia, bipolar disorders or suicidal ideations or attempts Gastrointestinal History: Hiatal hernia Genitourinary  History: Kidney disease Hematological History: Negative for anticoagulant therapy, anemia, bruising or bleeding easily, hemophilia, sickle cell disease or trait, thrombocytopenia or coagulupathies Endocrine History: Insulin-dependent diabetes mellitus Rheumatologic History: Rheumatoid arthritis Musculoskeletal History: Negative for myasthenia gravis, muscular dystrophy, multiple sclerosis or malignant hyperthermia Work History: Disabled since 2010  Allergies  Ms. Kaur is allergic to buspirone; citalopram; lisinopril; metformin; pravastatin; sitagliptin; tramadol; diltiazem hcl; gabapentin; hydralazine;  and lovastatin.  [place]  Medical:  Ms. Verdin  has a past medical history of Diabetes mellitus without complication ([place]); GI bleed; Aortic valve stenosis; Hyperlipidemia; Hypertension; Diverticulitis large intestine; Obesity; Shortness of breath dyspnea; Anxiety; Neuropathy in diabetes ([place]); Anemia; Vertigo; Bowel obstruction ([place]); Hypothyroidism; Arthritis; CKD (chronic kidney disease); Colostomy in place [facility]); and MRSA (methicillin resistant Staphylococcus aureus). Family: family history includes Diabetes in her mother; Hypertension in her father. Family history of colon cancer. Surgical:  has past surgical history that includes Colectomy; laparotomy closure of cecal perforation (05/09/2013); Tonsillectomy; Back surgery; Cardiac catheterization; Abdominal hysterectomy; laparotomy (N/A, 02/06/2015); Debridement of abdominal wall abscess (N/A, 02/22/2015); Excision mass abdominal (N/A, 02/24/2015); Application if wound vac (N/A, 02/24/2015); Excision mass abdominal (N/A, 02/26/2015); Application if wound vac (N/A, 02/26/2015); Wound debridement (N/A, 03/03/2015); Wound debridement (N/A, 03/09/2015); and Flexible sigmoidoscopy (06/19/2015). Tobacco:  reports that she quit smoking about 2 years ago. Her smoking use included Cigarettes. She has a .25 pack-year smoking history. She has never used smokeless  tobacco. Alcohol:  reports that she does not drink alcohol. Drug:  reports that she does not use illicit drugs. Active Ambulatory Problems    Diagnosis Date Noted  . Diverticulitis of colon 09/05/2014  . Obesity 09/05/2014  . Small bowel obstruction ([place]) 02/04/2015  . Absolute anemia 02/16/2015  . Aortic heart valve narrowing 02/16/2015  . Arthritis 02/16/2015  . B12 deficiency 03/02/2014  . Benign essential HTN 10/04/2014  . Chronic kidney disease (CKD), stage III (moderate) 02/16/2015  . Diverticulitis 02/16/2015  . Focal and segmental hyalinosis 02/16/2015  . Bleeding gastrointestinal 02/16/2015  . Acquired atrophy of thyroid 02/16/2015  . Abnormal presence of protein in urine 10/15/2010  . Psoriasis 02/16/2015  . Paroxysmal supraventricular tachycardia ([place]) 10/11/2014  . Breathlessness on exertion 10/11/2014  . Type 2 diabetes mellitus ([place]) 12/01/2013  . 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 Preop examination   . Chronic pain 07/03/2015  . Chronic abdominal pain (Location of Primary Source of Pain) (Bilateral) (L>R) 07/03/2015  . Long term current use of opiate analgesic 07/03/2015  . Long term prescription opiate use 07/03/2015  . Opiate use 07/03/2015  . Encounter for therapeutic drug level monitoring 07/03/2015  . Encounter for pain management planning 07/03/2015  . Neurogenic pain 07/03/2015  . Musculoskeletal pain 07/03/2015  . Visceral abdominal pain 07/03/2015  . Diabetic peripheral neuropathy ([place]) 07/03/2015  . Chronic low back pain (Location of Secondary source of pain) (Bilateral) (L>R) 07/03/2015  . Chronic lower extremity pain (Location of Tertiary source of pain) (Bilateral) (R>L) 07/03/2015  . Chronic hip pain (Bilateral) (L>R) 07/03/2015  . Chronic knee pain (Bilateral) (L>R) 07/03/2015  . Chronic neck pain (Bilateral) (L>R) 07/03/2015   Resolved Ambulatory Problems    Diagnosis Date Noted  . Incarcerated hernia   . Open wnd anterior abdomen  10/15/2013  . Wound, surgical, infected 02/21/2015  . Abdominal wall abscess at site of surgical wound    Past Medical History  Diagnosis Date  . Diabetes mellitus without complication ([place])   . GI bleed   . Aortic valve stenosis   . Hyperlipidemia   . Hypertension   . Diverticulitis large intestine   . Shortness of breath dyspnea   . Anxiety   . Neuropathy in diabetes ([place])   . Anemia   . Vertigo   . Bowel obstruction ([place])   . Hypothyroidism   . CKD (chronic kidney disease)   . Colostomy in place [facility])   . MRSA (methicillin resistant Staphylococcus aureus)  Constitutional Exam  Vitals: Blood pressure 171/93, pulse 70, temperature 98.2 F (36.8 C), resp. rate 18, height 5\' 3"  (1.6 m), weight 203 lb (92.08 kg), SpO2 97 %. General appearance: Well nourished, well developed, and well hydrated. In no acute distress. The patient has a colostomy back and the left lower quadrant of her abdomen where she is having her primary pain. Calculated BMI/Body habitus: Body mass index is 35.97 kg/(m^2). (35-39.9 kg/m2) Severe obesity (Class II) - 136% higher incidence of chronic pain Psych/Mental status: Alert and oriented x 3 (person, place, & time) Eyes: PERLA Respiratory: No evidence of acute respiratory distress  Cervical Spine Exam  Inspection: No masses, redness, or swelling Alignment: Symmetrical ROM: Functional: Adequate ROM Active: Unrestricted ROM Stability: No instability detected Muscle strength & Tone: Functionally intact Sensory: Unimpaired Palpation: No complaints of tenderness  Upper Extremity (UE) Exam    Side: Right upper extremity  Side: Left upper extremity  Inspection: No masses, redness, swelling, or asymmetry  Inspection: No masses, redness, swelling, or asymmetry  ROM:  ROM:  Functional: Adequate ROM  Functional: Adequate ROM  Active: Unrestricted ROM  Active: Unrestricted ROM  Muscle strength & Tone: Functionally intact  Muscle strength & Tone:  Functionally intact  Sensory: Unimpaired  Sensory: Unimpaired  Palpation: Non-contributory  Palpation: Non-contributory   Thoracic Spine Exam  Inspection: No masses, redness, or swelling Alignment: Symmetrical ROM: Functional: Adequate ROM Active: Unrestricted ROM Stability: No instability detected Sensory: Unimpaired Muscle strength & Tone: Functionally intact Palpation: No complaints of tenderness  Lumbar Spine Exam  Inspection: No masses, redness, or swelling Alignment: Symmetrical ROM: Functional: Limited ROM Active: Decreased ROM Stability: No instability detected Muscle strength & Tone: Functionally intact Sensory: Unimpaired Palpation: No complaints of tenderness Provocative Tests: Lumbar Hyperextension and rotation test: Positive for bilateral lumbar facet pain. Patrick's Maneuver: deferred  Gait & Posture Assessment  Gait: Unaffected Posture: WNL  Lower Extremity Exam    Side: Right lower extremity  Side: Left lower extremity  Inspection: No masses, redness, swelling, or asymmetry ROM:  Inspection: No masses, redness, swelling, or asymmetry ROM:  Functional: Adequate ROM  Functional: Adequate ROM  Active: Unrestricted ROM  Active: Unrestricted ROM  Muscle strength & Tone: Functionally intact  Muscle strength & Tone: Functionally intact  Sensory: Unimpaired  Sensory: Unimpaired  Palpation: Non-contributory  Palpation: Non-contributory   Assessment  Primary Diagnosis & Pertinent Problem List: The primary encounter diagnosis was Chronic pain. Diagnoses of Chronic abdominal pain, Long term current use of opiate analgesic, Long term prescription opiate use, Opiate use, Encounter for therapeutic drug level monitoring, Encounter for pain management planning, Neurogenic pain, Musculoskeletal pain, Visceral abdominal pain, Diabetic peripheral neuropathy (HCC), Chronic low back pain (Location of Secondary source of pain) (Bilateral) (L>R), Chronic pain of lower extremity,  unspecified laterality, Chronic hip pain, unspecified laterality, Chronic knee pain (Bilateral) (L>R), and Chronic neck pain (Bilateral) (L>R) were also pertinent to this visit.  Visit Diagnosis: 1. Chronic pain   2. Chronic abdominal pain   3. Long term current use of opiate analgesic   4. Long term prescription opiate use   5. Opiate use   6. Encounter for therapeutic drug level monitoring   7. Encounter for pain management planning   8. Neurogenic pain   9. Musculoskeletal pain   10. Visceral abdominal pain   11. Diabetic peripheral neuropathy ([place])   12. Chronic low back pain (Location of Secondary source of pain) (Bilateral) (L>R)   13. Chronic pain of lower extremity, unspecified laterality  14. Chronic hip pain, unspecified laterality   15. Chronic knee pain (Bilateral) (L>R)   16. Chronic neck pain (Bilateral) (L>R)     Assessment: No problem-specific assessment & plan notes found for this encounter.   Plan of Care  Initial Treatment Plan:  Please be advised that as per protocol, today's visit has been an evaluation only. We have not taken over the patient's controlled substance management.  Problem List Items Addressed This Visit      High   Chronic abdominal pain (Location of Primary Source of Pain) (Bilateral) (L>R) (Chronic)   Chronic hip pain (Bilateral) (L>R) (Chronic)   Relevant Orders   DG HIP UNILAT W OR W/O PELVIS 2-3 VIEWS LEFT   DG HIP UNILAT W OR W/O PELVIS 2-3 VIEWS RIGHT   Chronic knee pain (Bilateral) (L>R) (Chronic)   Relevant Orders   DG Knee 1-2 Views Left   DG Knee 1-2 Views Right   Chronic low back pain (Location of Secondary source of pain) (Bilateral) (L>R) (Chronic)   Relevant Orders   DG Lumbar Spine Complete W/Bend   Chronic lower extremity pain (Location of Tertiary source of pain) (Bilateral) (R>L) (Chronic)   Chronic neck pain (Bilateral) (L>R) (Chronic)   Relevant Orders   DG Cervical Spine Complete   Chronic pain - Primary  (Chronic)   Relevant Orders   Comprehensive metabolic panel   C-reactive protein   Magnesium   Sedimentation rate   Vitamin B12   25-Hydroxyvitamin D Lcms D2+D3   Diabetic peripheral neuropathy (HCC) (Chronic)   Relevant Orders   NCV with EMG(electromyography)   Musculoskeletal pain (Chronic)   Neurogenic pain (Chronic)   Visceral abdominal pain (Chronic)     Medium   Encounter for pain management planning   Encounter for therapeutic drug level monitoring   Long term current use of opiate analgesic (Chronic)   Relevant Orders   Compliance Drug Analysis, Ur   Long term prescription opiate use (Chronic)   Opiate use (Chronic)   Relevant Orders   Ambulatory referral to Psychology      Pharmacotherapy (Medications Ordered): No orders of the defined types were placed in this encounter.    Lab-work & Procedure Ordered: Orders Placed This Encounter  Procedures  . DG Cervical Spine Complete  . DG Lumbar Spine Complete W/Bend  . DG HIP UNILAT W OR W/O PELVIS 2-3 VIEWS LEFT  . DG HIP UNILAT W OR W/O PELVIS 2-3 VIEWS RIGHT  . DG Knee 1-2 Views Left  . DG Knee 1-2 Views Right  . Compliance Drug Analysis, Ur  . Comprehensive metabolic panel  . C-reactive protein  . Magnesium  . Sedimentation rate  . Vitamin B12  . 25-Hydroxyvitamin D Lcms D2+D3  . Ambulatory referral to Psychology  . NCV with EMG(electromyography)    Imaging Ordered: AMB REFERRAL TO PSYCHOLOGY DG CERVICAL SPINE COMPLETE DG LUMBAR SPINE COMPLETE W/BEND 6+V DG HIP UNILAT W OR W/O PELVIS 2-3 VIEWS LEFT DG HIP UNILAT W OR W/O PELVIS 2-3 VIEWS RIGHT DG KNEE 1-2 VIEWS LEFT DG KNEE 1-2 VIEWS RIGHT  Interventional Therapies: Scheduled: None at this time. Considering:  1. Diagnostic celiac plexus block under fluoroscopic guidance and IV sedation. 2. Diagnostic bilateral lumbar facet block under fluoroscopic guidance and IV sedation. 3. Diagnostic right-sided L4-5 lumbar epidural steroid injection under  fluoroscopic guidance with or without sedation. 4. Diagnostic intra-articular hip joint injection under fluoroscopic guidance, with or without sedation. 5. Diagnostic intra-articular knee injection under fluoroscopic guidance, without sedation. PRN  Procedures: None at this time.   Referral(s) or Consult(s): Medical psychology consult for substance use disorder evaluation  Medications administered during this visit: Ms. Lisboa had no medications administered during this visit.  Prescriptions ordered during this visit: New Prescriptions   No medications on file    Requested PM Follow-up: Return for Return after MedPsych (SUD) Eval, Return after ordered test(s)..  Future Appointments Date Time Provider [place]  07/06/2015 12:30 PM [facility] None  07/12/2015 1:00 PM Lyman Speller, RN [facility] None     Primary Care Physician: Adin Hector, MD Location: [facility] Note by: Kathlen Brunswick. Dossie Arbour, M.D, DABA, DABAPM, DABPM, DABIPP, FIPP  Pain Score Disclaimer: We use the NRS-11 scale. This is a self-reported, subjective measurement of pain severity with only modest accuracy. It is used primarily to identify changes within a particular patient. It must be understood that outpatient pain scales are significantly less accurate that those used for research, where they can be applied under ideal controlled circumstances with minimal exposure to variables. In reality, the score is likely to be a combination of pain intensity and pain affect, where pain affect describes the degree of emotional arousal or changes in action readiness caused by the sensory experience of pain. Factors such as social and work situation, setting, emotional state, anxiety levels, expectation, and prior pain experience may influence pain perception and show large inter-individual differences that may also be affected by time variables.  Patient instructions provided  during this appointment: Patient Instructions  Please [facility] to [facility] mall for xrays and lab work.

## 2015-07-03 NOTE — Patient Instructions (Signed)
Please go to [facility] mall for xrays and lab work.

## 2015-07-03 NOTE — Progress Notes (Signed)
Safety precautions to be maintained throughout the outpatient stay will include: orient to surroundings, keep bed in low position, maintain call bell within reach at all times, provide assistance with transfer out of bed and ambulation.  

## 2015-07-04 ENCOUNTER — Telehealth: Payer: Self-pay | Admitting: General Surgery

## 2015-07-04 NOTE — Telephone Encounter (Signed)
Returned phone call to patient at this time. No answer. Unable to leave message as the phone continued ringing.

## 2015-07-04 NOTE — Telephone Encounter (Signed)
She went to the pain clinic yesterday. They didn't give her any medication. Has to have blood work done. She is still hurting and would like some medication. After blood work and x rays the pain clinic wants her to see a psychologist. She also would like to talk to [employer] about the hole where her bag is. She feels it is getting smaller. She values Ambers opinion and would like to talk to her.

## 2015-07-05 NOTE — Telephone Encounter (Signed)
Patient returned your call. I got 2 good numbers from her for you to reach her at. Cell: [phone] and Home: [phone]

## 2015-07-06 ENCOUNTER — Other Ambulatory Visit: Payer: PPO

## 2015-07-06 ENCOUNTER — Inpatient Hospital Stay: Admission: RE | Admit: 2015-07-06 | Payer: PPO | Source: Ambulatory Visit

## 2015-07-06 NOTE — Telephone Encounter (Signed)
Returned phone call at this time. We had a long conversation about the pain clinic requirements. She was given the number to Dr. Arlyn Dunning (the Pyschologist referral) so that she can go ahead and get this scheduled prior to surgery so that she can remain a patient of the Pain clinic. I also asked her to have her labs and xrays done prior to her surgery to help keep her established with Dr. Dossie Arbour. I explained that we would not send her home without pain medications from her colostomy takedown but after the initial post-op period she would then be a patient of the pain clinic to help manage the rest of her pain. Also, we spoke about the opening of her current colostomy and how the Barium did show that there was a slight stenosis in this area but not to worry because Dr. Adonis Huguenin is aware and will be taking care of this in her surgery.  Patient verbalizes understanding and feels much better.

## 2015-07-07 ENCOUNTER — Ambulatory Visit
Admission: RE | Admit: 2015-07-07 | Discharge: 2015-07-07 | Disposition: A | Discharge status: Home / Self Care | Payer: PPO | Source: Ambulatory Visit | Attending: General Surgery | Admitting: General Surgery

## 2015-07-07 DIAGNOSIS — R0602 Shortness of breath: Secondary | ICD-10-CM | POA: Diagnosis present

## 2015-07-07 DIAGNOSIS — Z433 Encounter for attention to colostomy: Secondary | ICD-10-CM | POA: Insufficient documentation

## 2015-07-07 DIAGNOSIS — Z01812 Encounter for preprocedural laboratory examination: Secondary | ICD-10-CM | POA: Insufficient documentation

## 2015-07-07 DIAGNOSIS — Z01810 Encounter for preprocedural cardiovascular examination: Secondary | ICD-10-CM | POA: Insufficient documentation

## 2015-07-07 HISTORY — DX: Polyneuropathy, unspecified: G62.9

## 2015-07-07 HISTORY — DX: Supraventricular tachycardia: I47.1

## 2015-07-07 HISTORY — DX: Cardiac murmur, unspecified: R01.1

## 2015-07-07 HISTORY — DX: Cardiac arrhythmia, unspecified: I49.9

## 2015-07-07 HISTORY — DX: Gastritis, unspecified, without bleeding: K29.70

## 2015-07-07 HISTORY — DX: Other supraventricular tachycardia: I47.19

## 2015-07-07 LAB — CBC
HEMATOCRIT: 37.3 % (ref 35.0–47.0)
HEMOGLOBIN: 11.7 g/dL — AB (ref 12.0–16.0)
MCH: 24.8 pg — AB (ref 26.0–34.0)
MCHC: 31.3 g/dL — ABNORMAL LOW (ref 32.0–36.0)
MCV: 79 fL — AB (ref 80.0–100.0)
Platelets: 247 10*3/uL (ref 150–440)
RBC: 4.72 MIL/uL (ref 3.80–5.20)
RDW: 17.6 % — ABNORMAL HIGH (ref 11.5–14.5)
WBC: 7.4 10*3/uL (ref 3.6–11.0)

## 2015-07-07 LAB — BASIC METABOLIC PANEL
Anion gap: 7 (ref 5–15)
BUN: 34 mg/dL — AB (ref 6–20)
CHLORIDE: 109 mmol/L (ref 101–111)
CO2: 24 mmol/L (ref 22–32)
Calcium: 8.5 mg/dL — ABNORMAL LOW (ref 8.9–10.3)
Creatinine, Ser: 2.06 mg/dL — ABNORMAL HIGH (ref 0.44–1.00)
GFR calc Af Amer: 28 mL/min — ABNORMAL LOW (ref >60)
GFR calc non Af Amer: 24 mL/min — ABNORMAL LOW (ref >60)
GLUCOSE: 119 mg/dL — AB (ref 65–99)
POTASSIUM: 4.4 mmol/L (ref 3.5–5.1)
Sodium: 140 mmol/L (ref 135–145)

## 2015-07-07 LAB — SURGICAL PCR SCREEN
MRSA, PCR: NEGATIVE
Staphylococcus aureus: POSITIVE — AB

## 2015-07-07 NOTE — Pre-Procedure Instructions (Signed)
Dr. Andree Elk made aware of anesthesia consult and requested cardiac clearance which has been obtained and is in care everywhere

## 2015-07-07 NOTE — Patient Instructions (Signed)
  Your procedure is scheduled on:Jul 12, 2015 (Wednesday) Report to Same Day Surgery 2nd floor Medical [facility] To find out your arrival time please call [phone] between 1PM - 3PM on Jul 11, 2015 (Tuesday)  Remember: Instructions that are not followed completely may result in serious medical risk, up to and including death, or upon the discretion of your surgeon and anesthesiologist your surgery may need to be rescheduled.    _x___ 1. Do not eat food or drink liquids after midnight. No gum chewing or hard candies.     _x__ 2. No Alcohol for 24 hours before or after surgery.   ____ 3. Bring all medications with you on the day of surgery if instructed.    __x__ 4. Notify your doctor if there is any change in your medical condition     (cold, fever, infections).     Do not wear jewelry, make-up, hairpins, clips or nail polish.  Do not wear lotions, powders, or perfumes. You may wear deodorant.  Do not shave 48 hours prior to surgery. Men may shave face and neck.  Do not bring valuables to the hospital.    [facility] is not responsible for any belongings or valuables.               Contacts, dentures or bridgework may not be worn into surgery.  Leave your suitcase in the car. After surgery it may be brought to your room.  For patients admitted to the hospital, discharge time is determined by your treatment team.   Patients discharged the day of surgery will not be allowed to drive home.    Please read over the following fact sheets that you were given:   [facility] Preparing for Surgery and or MRSA Information   _x___ Take these medicines the morning of surgery with A SIP OF WATER:    1. Metoprolol  2.Micardis  3.FOLLOW DR [place]  4.  5.  6.  ____ Fleet Enema (as directed)   _x___ Use CHG Soap or sage wipes as directed on instruction sheet   ____ Use inhalers on the day of surgery and bring to hospital day of surgery  ____ Stop metformin 2 days  prior to surgery    __x__ Take 1/2 of usual insulin dose the night before surgery and none on the morning of surgery (TAKE ONE- HALF OF LANTUS AT BEDTIME ON MAY 23, AND NO INSULIN THE MORNING OF SURGERY         .   ____ Stop aspirin or coumadin, or plavix  _x__ Stop Anti-inflammatories such as Advil, Aleve, Ibuprofen, Motrin, Naproxen,          Naprosyn, Goodies powders or aspirin products. Ok to take Tylenol.   ____ Stop supplements until after surgery.    ____ Bring C-Pap to the hospital.

## 2015-07-08 NOTE — Pre-Procedure Instructions (Signed)
Met B and positive staph results sent to Dr. Adonis Huguenin for review.  Asked if wanted any treatment for positive staph?

## 2015-07-10 ENCOUNTER — Other Ambulatory Visit: Payer: Self-pay | Admitting: *Deleted

## 2015-07-10 LAB — COMPLIANCE DRUG ANALYSIS, UR

## 2015-07-10 NOTE — Patient Outreach (Signed)
Spoke with pt about scheduled 5/24 home visit as view in medical record shows to have surgery (colostomy reversal) that day.   As discussed, home visit cancelled, RN CM to f/u at discharge.      Zara Chess.   [facility]  [phone]

## 2015-07-12 ENCOUNTER — Encounter
Admission: RE | Disposition: A | Discharge status: Home Health | Payer: Self-pay | Source: Ambulatory Visit | Attending: General Surgery

## 2015-07-12 ENCOUNTER — Inpatient Hospital Stay: Payer: PPO | Admitting: Registered Nurse

## 2015-07-12 ENCOUNTER — Inpatient Hospital Stay
Admission: RE | Admit: 2015-07-12 | Discharge: 2015-07-22 | DRG: 329 | Disposition: A | Discharge status: Home Health | Payer: PPO | Source: Ambulatory Visit | Attending: General Surgery | Admitting: General Surgery

## 2015-07-12 ENCOUNTER — Ambulatory Visit: Payer: Self-pay | Admitting: *Deleted

## 2015-07-12 DIAGNOSIS — K9409 Other complications of colostomy: Principal | ICD-10-CM | POA: Diagnosis present

## 2015-07-12 DIAGNOSIS — I129 Hypertensive chronic kidney disease with stage 1 through stage 4 chronic kidney disease, or unspecified chronic kidney disease: Secondary | ICD-10-CM | POA: Diagnosis present

## 2015-07-12 DIAGNOSIS — E785 Hyperlipidemia, unspecified: Secondary | ICD-10-CM | POA: Diagnosis present

## 2015-07-12 DIAGNOSIS — N736 Female pelvic peritoneal adhesions (postinfective): Secondary | ICD-10-CM | POA: Diagnosis present

## 2015-07-12 DIAGNOSIS — K219 Gastro-esophageal reflux disease without esophagitis: Secondary | ICD-10-CM | POA: Diagnosis present

## 2015-07-12 DIAGNOSIS — F329 Major depressive disorder, single episode, unspecified: Secondary | ICD-10-CM | POA: Diagnosis present

## 2015-07-12 DIAGNOSIS — I4892 Unspecified atrial flutter: Secondary | ICD-10-CM | POA: Diagnosis not present

## 2015-07-12 DIAGNOSIS — E1140 Type 2 diabetes mellitus with diabetic neuropathy, unspecified: Secondary | ICD-10-CM | POA: Diagnosis present

## 2015-07-12 DIAGNOSIS — Z5331 Laparoscopic surgical procedure converted to open procedure: Secondary | ICD-10-CM | POA: Diagnosis not present

## 2015-07-12 DIAGNOSIS — E1122 Type 2 diabetes mellitus with diabetic chronic kidney disease: Secondary | ICD-10-CM | POA: Diagnosis present

## 2015-07-12 DIAGNOSIS — Z9889 Other specified postprocedural states: Secondary | ICD-10-CM

## 2015-07-12 DIAGNOSIS — N170 Acute kidney failure with tubular necrosis: Secondary | ICD-10-CM | POA: Diagnosis not present

## 2015-07-12 DIAGNOSIS — Z6835 Body mass index (BMI) 35.0-35.9, adult: Secondary | ICD-10-CM

## 2015-07-12 DIAGNOSIS — IMO0002 Reserved for concepts with insufficient information to code with codable children: Secondary | ICD-10-CM

## 2015-07-12 DIAGNOSIS — E875 Hyperkalemia: Secondary | ICD-10-CM | POA: Diagnosis not present

## 2015-07-12 DIAGNOSIS — Y833 Surgical operation with formation of external stoma as the cause of abnormal reaction of the patient, or of later complication, without mention of misadventure at the time of the procedure: Secondary | ICD-10-CM | POA: Diagnosis present

## 2015-07-12 DIAGNOSIS — Z833 Family history of diabetes mellitus: Secondary | ICD-10-CM | POA: Diagnosis not present

## 2015-07-12 DIAGNOSIS — Z8249 Family history of ischemic heart disease and other diseases of the circulatory system: Secondary | ICD-10-CM | POA: Diagnosis not present

## 2015-07-12 DIAGNOSIS — N179 Acute kidney failure, unspecified: Secondary | ICD-10-CM

## 2015-07-12 DIAGNOSIS — I4891 Unspecified atrial fibrillation: Secondary | ICD-10-CM | POA: Diagnosis not present

## 2015-07-12 DIAGNOSIS — E11649 Type 2 diabetes mellitus with hypoglycemia without coma: Secondary | ICD-10-CM | POA: Diagnosis not present

## 2015-07-12 DIAGNOSIS — D631 Anemia in chronic kidney disease: Secondary | ICD-10-CM | POA: Diagnosis present

## 2015-07-12 DIAGNOSIS — E039 Hypothyroidism, unspecified: Secondary | ICD-10-CM | POA: Diagnosis present

## 2015-07-12 DIAGNOSIS — I959 Hypotension, unspecified: Secondary | ICD-10-CM | POA: Diagnosis present

## 2015-07-12 DIAGNOSIS — E872 Acidosis: Secondary | ICD-10-CM | POA: Diagnosis present

## 2015-07-12 DIAGNOSIS — N183 Chronic kidney disease, stage 3 (moderate): Secondary | ICD-10-CM | POA: Diagnosis present

## 2015-07-12 DIAGNOSIS — I350 Nonrheumatic aortic (valve) stenosis: Secondary | ICD-10-CM | POA: Diagnosis present

## 2015-07-12 DIAGNOSIS — K913 Postprocedural intestinal obstruction: Secondary | ICD-10-CM | POA: Diagnosis not present

## 2015-07-12 DIAGNOSIS — E871 Hypo-osmolality and hyponatremia: Secondary | ICD-10-CM | POA: Diagnosis present

## 2015-07-12 DIAGNOSIS — Z433 Encounter for attention to colostomy: Secondary | ICD-10-CM | POA: Diagnosis present

## 2015-07-12 DIAGNOSIS — Z87891 Personal history of nicotine dependence: Secondary | ICD-10-CM | POA: Diagnosis not present

## 2015-07-12 HISTORY — PX: COLOSTOMY REVERSAL: SHX5782

## 2015-07-12 HISTORY — PX: LAPAROTOMY: SHX154

## 2015-07-12 HISTORY — PX: LAPAROSCOPY: SHX197

## 2015-07-12 HISTORY — PX: LYSIS OF ADHESION: SHX5961

## 2015-07-12 LAB — CBC
HCT: 37.3 % (ref 35.0–47.0)
Hemoglobin: 11.6 g/dL — ABNORMAL LOW (ref 12.0–16.0)
MCH: 24.8 pg — ABNORMAL LOW (ref 26.0–34.0)
MCHC: 31 g/dL — ABNORMAL LOW (ref 32.0–36.0)
MCV: 80 fL (ref 80.0–100.0)
Platelets: 216 10*3/uL (ref 150–440)
RBC: 4.66 MIL/uL (ref 3.80–5.20)
RDW: 17.4 % — ABNORMAL HIGH (ref 11.5–14.5)
WBC: 9.8 10*3/uL (ref 3.6–11.0)

## 2015-07-12 LAB — CREATININE, SERUM
Creatinine, Ser: 2 mg/dL — ABNORMAL HIGH (ref 0.44–1.00)
GFR calc Af Amer: 29 mL/min — ABNORMAL LOW (ref >60)
GFR calc non Af Amer: 25 mL/min — ABNORMAL LOW (ref >60)

## 2015-07-12 LAB — GLUCOSE, CAPILLARY
Glucose-Capillary: 74 mg/dL (ref 65–99)
Glucose-Capillary: 129 mg/dL — ABNORMAL HIGH (ref 65–99)
Glucose-Capillary: 141 mg/dL — ABNORMAL HIGH (ref 65–99)
Glucose-Capillary: 71 mg/dL (ref 65–99)

## 2015-07-12 SURGERY — COLOSTOMY REVERSAL
Anesthesia: General | Wound class: Contaminated

## 2015-07-12 MED ORDER — MIDAZOLAM HCL 2 MG/2ML IJ SOLN
INTRAMUSCULAR | Status: DC | PRN
  Administered 2015-07-12: 2 mg via INTRAVENOUS

## 2015-07-12 MED ORDER — NALOXONE HCL 0.4 MG/ML IJ SOLN: 0.4000 mg | INTRAMUSCULAR | Status: DC | PRN

## 2015-07-12 MED ORDER — DIPHENHYDRAMINE HCL 50 MG/ML IJ SOLN: 12.5000 mg | Freq: Four times a day (QID) | INTRAMUSCULAR | Status: DC | PRN

## 2015-07-12 MED ORDER — PHENYLEPHRINE HCL 10 MG/ML IJ SOLN
INTRAMUSCULAR | Status: DC | PRN
  Administered 2015-07-12 (×2): 50 ug via INTRAVENOUS

## 2015-07-12 MED ORDER — LIDOCAINE HCL (PF) 1 % IJ SOLN
INTRAMUSCULAR | Status: AC
  Filled 2015-07-12: qty 30

## 2015-07-12 MED ORDER — FAMOTIDINE 20 MG PO TABS
20.0000 mg | ORAL_TABLET | Freq: Once | ORAL | Status: AC
  Administered 2015-07-12: 20 mg via ORAL

## 2015-07-12 MED ORDER — LACTATED RINGERS IV SOLN
INTRAVENOUS | Status: DC
  Administered 2015-07-13 (×2): via INTRAVENOUS

## 2015-07-12 MED ORDER — SUCCINYLCHOLINE CHLORIDE 20 MG/ML IJ SOLN
INTRAMUSCULAR | Status: DC | PRN
  Administered 2015-07-12: 40 mg via INTRAVENOUS
  Administered 2015-07-12: 100 mg via INTRAVENOUS

## 2015-07-12 MED ORDER — ENOXAPARIN SODIUM 40 MG/0.4ML ~~LOC~~ SOLN
40.0000 mg | SUBCUTANEOUS | Status: DC
Start: 2015-07-13 — End: 2015-07-13

## 2015-07-12 MED ORDER — ONDANSETRON HCL 4 MG/2ML IJ SOLN: 4.0000 mg | Freq: Four times a day (QID) | INTRAMUSCULAR | Status: DC | PRN

## 2015-07-12 MED ORDER — CHLORHEXIDINE GLUCONATE 4 % EX LIQD
60.0000 mL | Freq: Once | CUTANEOUS | Status: AC
  Administered 2015-07-11: 4 via TOPICAL

## 2015-07-12 MED ORDER — CEFOTETAN DISODIUM 2 G IJ SOLR
2.0000 g | INTRAMUSCULAR | Status: AC
  Administered 2015-07-12: 2 g via INTRAVENOUS
  Filled 2015-07-12: qty 2

## 2015-07-12 MED ORDER — INSULIN ASPART 100 UNIT/ML ~~LOC~~ SOLN
0.0000 [IU] | Freq: Three times a day (TID) | SUBCUTANEOUS | Status: DC
  Administered 2015-07-12 – 2015-07-14 (×4): 2 [IU] via SUBCUTANEOUS
  Administered 2015-07-15: 3 [IU] via SUBCUTANEOUS
  Administered 2015-07-16 – 2015-07-18 (×4): 2 [IU] via SUBCUTANEOUS
  Administered 2015-07-19: 3 [IU] via SUBCUTANEOUS
  Administered 2015-07-19 (×2): 2 [IU] via SUBCUTANEOUS
  Administered 2015-07-21: 3 [IU] via SUBCUTANEOUS
  Administered 2015-07-22: 2 [IU] via SUBCUTANEOUS
  Filled 2015-07-12 (×2): qty 2
  Filled 2015-07-12 (×2): qty 3
  Filled 2015-07-12 (×5): qty 2
  Filled 2015-07-12: qty 3
  Filled 2015-07-12: qty 2
  Filled 2015-07-12: qty 1
  Filled 2015-07-12 (×2): qty 2

## 2015-07-12 MED ORDER — ONDANSETRON HCL 4 MG/2ML IJ SOLN: 4.0000 mg | Freq: Once | INTRAMUSCULAR | Status: DC | PRN

## 2015-07-12 MED ORDER — SODIUM CHLORIDE 0.9% FLUSH: 9.0000 mL | INTRAVENOUS | Status: DC | PRN

## 2015-07-12 MED ORDER — ENOXAPARIN SODIUM 40 MG/0.4ML ~~LOC~~ SOLN
40.0000 mg | Freq: Once | SUBCUTANEOUS | Status: AC
  Administered 2015-07-12: 40 mg via SUBCUTANEOUS
  Filled 2015-07-12: qty 0.4

## 2015-07-12 MED ORDER — ROCURONIUM BROMIDE 100 MG/10ML IV SOLN
INTRAVENOUS | Status: DC | PRN
  Administered 2015-07-12: 20 mg via INTRAVENOUS
  Administered 2015-07-12: 50 mg via INTRAVENOUS
  Administered 2015-07-12: 20 mg via INTRAVENOUS

## 2015-07-12 MED ORDER — FENTANYL CITRATE (PF) 100 MCG/2ML IJ SOLN
INTRAMUSCULAR | Status: AC
  Administered 2015-07-12: 25 ug via INTRAVENOUS
  Filled 2015-07-12: qty 2

## 2015-07-12 MED ORDER — FAMOTIDINE 20 MG PO TABS
ORAL_TABLET | ORAL | Status: AC
  Administered 2015-07-12: 20 mg via ORAL
  Filled 2015-07-12: qty 1

## 2015-07-12 MED ORDER — BUPIVACAINE HCL (PF) 0.5 % IJ SOLN
INTRAMUSCULAR | Status: AC
  Filled 2015-07-12: qty 30

## 2015-07-12 MED ORDER — PANTOPRAZOLE SODIUM 40 MG IV SOLR
40.0000 mg | Freq: Every day | INTRAVENOUS | Status: DC
  Administered 2015-07-12 – 2015-07-17 (×6): 40 mg via INTRAVENOUS
  Filled 2015-07-12 (×6): qty 40

## 2015-07-12 MED ORDER — PROPOFOL 10 MG/ML IV BOLUS
INTRAVENOUS | Status: DC | PRN
  Administered 2015-07-12: 60 mg via INTRAVENOUS
  Administered 2015-07-12: 140 mg via INTRAVENOUS

## 2015-07-12 MED ORDER — SUGAMMADEX SODIUM 200 MG/2ML IV SOLN
INTRAVENOUS | Status: DC | PRN
  Administered 2015-07-12: 200 mg via INTRAVENOUS

## 2015-07-12 MED ORDER — CHLORHEXIDINE GLUCONATE 4 % EX LIQD
60.0000 mL | Freq: Once | CUTANEOUS | Status: AC
  Administered 2015-07-12: 4 via TOPICAL

## 2015-07-12 MED ORDER — FENTANYL CITRATE (PF) 100 MCG/2ML IJ SOLN
25.0000 ug | INTRAMUSCULAR | Status: AC | PRN
  Administered 2015-07-12 (×6): 25 ug via INTRAVENOUS

## 2015-07-12 MED ORDER — SODIUM CHLORIDE 0.9 % IV SOLN
INTRAVENOUS | Status: DC
  Administered 2015-07-12: 07:00:00 via INTRAVENOUS

## 2015-07-12 MED ORDER — LACTATED RINGERS IV SOLN
INTRAVENOUS | Status: DC | PRN
  Administered 2015-07-12 (×2): via INTRAVENOUS

## 2015-07-12 MED ORDER — ONDANSETRON HCL 4 MG/2ML IJ SOLN
INTRAMUSCULAR | Status: DC | PRN
  Administered 2015-07-12: 4 mg via INTRAVENOUS

## 2015-07-12 MED ORDER — DIPHENHYDRAMINE HCL 12.5 MG/5ML PO ELIX
12.5000 mg | ORAL_SOLUTION | Freq: Four times a day (QID) | ORAL | Status: DC | PRN
  Administered 2015-07-14: 12.5 mg via ORAL
  Filled 2015-07-12 (×2): qty 5

## 2015-07-12 MED ORDER — DEXTROSE 5 % IV SOLN
2.0000 g | INTRAVENOUS | Status: DC | PRN
  Administered 2015-07-12: 2 g via INTRAVENOUS

## 2015-07-12 MED ORDER — FENTANYL CITRATE (PF) 100 MCG/2ML IJ SOLN
INTRAMUSCULAR | Status: DC | PRN
  Administered 2015-07-12 (×2): 50 ug via INTRAVENOUS
  Administered 2015-07-12: 100 ug via INTRAVENOUS

## 2015-07-12 MED ORDER — MORPHINE SULFATE 2 MG/ML IV SOLN
INTRAVENOUS | Status: DC
  Administered 2015-07-12: 13.5 mg via INTRAVENOUS
  Administered 2015-07-12: 14:00:00 via INTRAVENOUS
  Administered 2015-07-12: 6.5 mg via INTRAVENOUS
  Administered 2015-07-12: 4.5 mg via INTRAVENOUS
  Administered 2015-07-13: 5.25 mg via INTRAVENOUS
  Administered 2015-07-13: 12 mg via INTRAVENOUS
  Administered 2015-07-13: 9.75 mg via INTRAVENOUS
  Administered 2015-07-13: 11.25 mg via INTRAVENOUS
  Administered 2015-07-14: 7.5 mg via INTRAVENOUS
  Administered 2015-07-14 (×2): 3 mg via INTRAVENOUS
  Filled 2015-07-12 (×2): qty 25

## 2015-07-12 SURGICAL SUPPLY — 90 items
ADHESIVE MASTISOL STRL (MISCELLANEOUS) IMPLANT
BLADE SURG SZ10 CARB STEEL (BLADE) ×4 IMPLANT
BUR EGG 5.0 (BLADE) ×3 IMPLANT
BUR EGG 5.0MM (BLADE) ×1
CANISTER SUCT 1200ML W/VALVE (MISCELLANEOUS) ×4 IMPLANT
CATH FOL LEG HOLDER (MISCELLANEOUS) ×4 IMPLANT
CATH TRAY 16F METER LATEX (MISCELLANEOUS) ×4 IMPLANT
CHLORAPREP W/TINT 26ML (MISCELLANEOUS) ×8 IMPLANT
CLEANER CAUTERY TIP 5X5 PAD (MISCELLANEOUS) ×2 IMPLANT
CLIP TI LARGE 6 (CLIP) IMPLANT
CLIP TI MEDIUM 6 (CLIP) IMPLANT
CLOSURE WOUND 1/2 X4 (GAUZE/BANDAGES/DRESSINGS)
DRAIN PENROSE 1/4X12 LTX (DRAIN) ×4 IMPLANT
DRAPE LAPAROTOMY 100X77 ABD (DRAPES) ×4 IMPLANT
DRAPE LEGGINS SURG 28X43 STRL (DRAPES) ×4 IMPLANT
DRAPE TABLE BACK 80X90 (DRAPES) ×4 IMPLANT
DRAPE UNDER BUTTOCK W/FLU (DRAPES) ×4 IMPLANT
DRSG OPSITE POSTOP 3X4 (GAUZE/BANDAGES/DRESSINGS) ×4 IMPLANT
DRSG OPSITE POSTOP 4X14 (GAUZE/BANDAGES/DRESSINGS) ×4 IMPLANT
DRSG OPSITE POSTOP 4X6 (GAUZE/BANDAGES/DRESSINGS) ×4 IMPLANT
ELECT BLADE 6.5 EXT (BLADE) ×4 IMPLANT
ELECT CAUTERY BLADE 6.4 (BLADE) ×4 IMPLANT
ELECT REM PT RETURN 9FT ADLT (ELECTROSURGICAL) ×4
ELECTRODE REM PT RTRN 9FT ADLT (ELECTROSURGICAL) ×2 IMPLANT
GLOVE BIO SURGEON STRL SZ7.5 (GLOVE) ×16 IMPLANT
GLOVE INDICATOR 8.0 STRL GRN (GLOVE) ×16 IMPLANT
GOWN STRL REUS W/ TWL LRG LVL3 (GOWN DISPOSABLE) ×10 IMPLANT
GOWN STRL REUS W/ TWL XL LVL3 (GOWN DISPOSABLE) ×4 IMPLANT
GOWN STRL REUS W/TWL LRG LVL3 (GOWN DISPOSABLE) ×10
GOWN STRL REUS W/TWL XL LVL3 (GOWN DISPOSABLE) ×4
HANDLE YANKAUER SUCT BULB TIP (MISCELLANEOUS) ×8 IMPLANT
IRRIGATION STRYKERFLOW (MISCELLANEOUS) ×2 IMPLANT
IRRIGATOR STRYKERFLOW (MISCELLANEOUS) ×4
IV NS 1000ML (IV SOLUTION) ×2
IV NS 1000ML BAXH (IV SOLUTION) ×2 IMPLANT
KIT RM TURNOVER STRD PROC AR (KITS) ×4 IMPLANT
LABEL OR SOLS (LABEL) ×4 IMPLANT
LIGASURE BLUNT 5MM 37CM (INSTRUMENTS) IMPLANT
LIGASURE MARYLAND LAP STAND (ELECTROSURGICAL) IMPLANT
NEEDLE HYPO 25X1 1.5 SAFETY (NEEDLE) ×4 IMPLANT
NEEDLE VERESS 14GA 120MM (NEEDLE) ×4 IMPLANT
NS IRRIG 1000ML POUR BTL (IV SOLUTION) ×4 IMPLANT
NS IRRIG 500ML POUR BTL (IV SOLUTION) ×8 IMPLANT
PACK BASIN MAJOR ARMC (MISCELLANEOUS) IMPLANT
PACK COLON CLEAN CLOSURE (MISCELLANEOUS) ×4 IMPLANT
PACK LAP CHOLECYSTECTOMY (MISCELLANEOUS) ×4 IMPLANT
PAD CLEANER CAUTERY TIP 5X5 (MISCELLANEOUS) ×2
PAD PREP 24X41 OB/GYN DISP (PERSONAL CARE ITEMS) IMPLANT
PENCIL ELECTRO HAND CTR (MISCELLANEOUS) ×4 IMPLANT
RETAINER VISCERA MED (MISCELLANEOUS) IMPLANT
RETRACTOR WOUND ALXS 18CM MED (MISCELLANEOUS) ×2 IMPLANT
RTRCTR WOUND ALEXIS O 18CM MED (MISCELLANEOUS) ×4
SCISSORS METZENBAUM CVD 33 (INSTRUMENTS) IMPLANT
SLEEVE ENDOPATH XCEL 5M (ENDOMECHANICALS) ×8 IMPLANT
SOL PREP PVP 2OZ (MISCELLANEOUS) ×4
SOLUTION PREP PVP 2OZ (MISCELLANEOUS) ×2 IMPLANT
SPONGE LAP 18X18 5 PK (GAUZE/BANDAGES/DRESSINGS) ×12 IMPLANT
STAPLER ENDO ILS CVD 18 33 (STAPLE) ×4 IMPLANT
STAPLER SKIN PROX 35W (STAPLE) ×8 IMPLANT
STRIP CLOSURE SKIN 1/2X4 (GAUZE/BANDAGES/DRESSINGS) IMPLANT
SURGILUBE 2OZ TUBE FLIPTOP (MISCELLANEOUS) IMPLANT
SUT ETHILON 3-0 KS 30 BLK (SUTURE) IMPLANT
SUT MNCRL 4-0 (SUTURE)
SUT MNCRL 4-0 27XMFL (SUTURE)
SUT NYLON 2-0 (SUTURE) IMPLANT
SUT PDS AB 1 CT1 36 (SUTURE) ×4 IMPLANT
SUT PDS AB 1 TP1 96 (SUTURE) ×8 IMPLANT
SUT PROLENE 3 0 SH DA (SUTURE) ×4 IMPLANT
SUT SILK 2 0 (SUTURE) ×2
SUT SILK 2 0 SH (SUTURE) IMPLANT
SUT SILK 2 0SH CR/8 30 (SUTURE) IMPLANT
SUT SILK 2-0 30XBRD TIE 12 (SUTURE) ×2 IMPLANT
SUT SILK 3 0 (SUTURE) ×2
SUT SILK 3-0 (SUTURE) ×6 IMPLANT
SUT SILK 3-0 18XBRD TIE 12 (SUTURE) ×2 IMPLANT
SUT SILK 3-0 SH-1 18XCR BRD (SUTURE) ×2
SUT VIC AB 1 CTX 27 (SUTURE) IMPLANT
SUT VIC AB 2-0 CT1 27 (SUTURE)
SUT VIC AB 2-0 CT1 TAPERPNT 27 (SUTURE) IMPLANT
SUT VIC AB 3-0 SH 27 (SUTURE)
SUT VIC AB 3-0 SH 27X BRD (SUTURE) IMPLANT
SUT VICRYL PLUS ABS 0 54 (SUTURE) IMPLANT
SUTURE MNCRL 4-0 27XMF (SUTURE) IMPLANT
SUTURE SILK 3-0 SH-1 18XCR BRD (SUTURE) ×2 IMPLANT
SYR BULB IRRIG 60ML STRL (SYRINGE) ×4 IMPLANT
TOWEL OR 17X26 4PK STRL BLUE (TOWEL DISPOSABLE) ×4 IMPLANT
TROCAR 5M 150ML BLDLS (TROCAR) ×4 IMPLANT
TROCAR XCEL NON-BLD 11X100MML (ENDOMECHANICALS) IMPLANT
TROCAR XCEL NON-BLD 5MMX100MML (ENDOMECHANICALS) ×4 IMPLANT
TUBING INSUFFLATOR HEATED (MISCELLANEOUS) ×4 IMPLANT

## 2015-07-12 NOTE — H&P (View-Only) (Signed)
Outpatient Surgical Follow Up  06/27/2015  [name] is an 65 y.o. female.   Chief Complaint  Patient presents with  . Other    Discuss Colostomy Reversal    HPI: 65 year old female returns to clinic today to discuss planning for colostomy reversal. Patient reports that she was last seen she has done quite well and denies any current real complaints other than the occasional abdominal wall soreness. Her ostomy site has remained retracted but is continued to function well. Gastrologist was unable to pass the colonoscope through the strictured opening however a barium study was performed with the rectum and the ostomy site which showed no evidence of stricture or leak. She is very excited about having her ostomy reversed although appropriately scared as well. She denies any fevers, chills, nausea, vomiting, chest pain, shortness of breath currently.  Past Medical History  Diagnosis Date  . Diabetes mellitus without complication ([place])   . GI bleed   . Aortic valve stenosis   . Hyperlipidemia   . Hypertension   . Diverticulitis large intestine   . Obesity   . Shortness of breath dyspnea   . Anxiety   . Neuropathy in diabetes ([place])   . Anemia   . Vertigo   . Bowel obstruction ([place])   . Hypothyroidism   . Arthritis     feet, legs  . CKD (chronic kidney disease)   . Colostomy in place [facility])   . MRSA (methicillin resistant Staphylococcus aureus)     at abdominal wound.  Jan 2017.  Treated.     Past Surgical History  Procedure Laterality Date  . Colectomy    . Laparotomy closure of cecal perforation  05/09/2013    Dr. Marina Gravel  . Tonsillectomy    . Back surgery      spur frmoved from lower back  . Cardiac catheterization    . Abdominal hysterectomy    . Laparotomy N/A 02/06/2015    Procedure: Laparotomy, reduction of incarcerated parastomal hernia, repair of parastomal hernia with mesh;  Surgeon: Sherri Rad, MD;  Location: [facility] ORS;  Service: General;  Laterality: N/A;  .  Debridement of abdominal wall abscess N/A 02/22/2015    Procedure: DEBRIDEMENT OF ABDOMINAL WALL ABSCESS;  Surgeon: Clayburn Pert, MD;  Location: [facility] ORS;  Service: General;  Laterality: N/A;  . Excision mass abdominal N/A 02/24/2015    Procedure: EXCISION MASS ABDOMINAL  / [place];  Surgeon: Clayburn Pert, MD;  Location: [facility] ORS;  Service: General;  Laterality: N/A;  . Application of wound vac N/A 02/24/2015    Procedure: APPLICATION OF WOUND VAC;  Surgeon: Clayburn Pert, MD;  Location: [facility] ORS;  Service: General;  Laterality: N/A;  . Excision mass abdominal N/A 02/26/2015    Procedure: EXCISION MASS ABDOMINAL/wash out;  Surgeon: Clayburn Pert, MD;  Location: [facility] ORS;  Service: General;  Laterality: N/A;  . Application of wound vac N/A 02/26/2015    Procedure: APPLICATION OF WOUND VAC;  Surgeon: Clayburn Pert, MD;  Location: [facility] ORS;  Service: General;  Laterality: N/A;  . Wound debridement N/A 03/03/2015    Procedure: DEBRIDEMENT ABDOMINAL WOUND;  Surgeon: Florene Glen, MD;  Location: [facility] ORS;  Service: General;  Laterality: N/A;  . Wound debridement N/A 03/09/2015    Procedure: DEBRIDEMENT ABDOMINAL WOUND;  Surgeon: Florene Glen, MD;  Location: [facility] ORS;  Service: General;  Laterality: N/A;  . Flexible sigmoidoscopy  06/19/2015    Procedure: FLEXIBLE SIGMOIDOSCOPY;  Surgeon: Lucilla Lame, MD;  Location: [facility];  Service: Endoscopy;;  UNABLE TO ACCESS OSTOMY SITE FOR ACCESS INTO COLON    Family History  Problem Relation Age of Onset  . Diabetes Mother   . Hypertension Father     Social History:  reports that she quit smoking about 2 years ago. Her smoking use included Cigarettes. She has a .25 pack-year smoking history. She has never used smokeless tobacco. She reports that she does not drink alcohol or use illicit drugs.  Allergies:  Allergies  Allergen Reactions  . Buspirone Other (See Comments)    Weakness  . Citalopram Other (See Comments)  . Lisinopril  Cough  . Metformin Diarrhea    At 1000 mg dose  . Pravastatin Other (See Comments)    insomnia  . Sitagliptin Other (See Comments)    constipation  . Tramadol Itching  . Diltiazem Hcl Palpitations  . Gabapentin Palpitations  . Hydralazine Rash  . Lovastatin Palpitations    Medications reviewed.    ROS A multipoint review of systems was completed. All pertinent positives and negatives are documented within the history of present illness and remainder are negative.   BP 152/83 mmHg  Pulse 68  Temp(Src) 99 F (37.2 C) (Oral)  Ht 5\' 3"  (1.6 m)  Wt 92.08 kg (203 lb)  BMI 35.97 kg/m2  Physical Exam Gen.: No acute distress Neck: Supple and nontender Chest: Clear to auscultation Heart: Regular rhythm Abdomen: Very large, soft, nontender. Multiple folds of abdominal wall fat. Ostomy bag in place to left upper quadrant.    No results found for this or any previous visit (from the past 48 hour(s)). No results found.  Assessment/Plan:  1. Breathlessness on exertion This is much improved. She has medical clearance for surgery.  2. 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 She is here today to discuss takedown of her colostomy. The procedure itself was discussed in detail To include the risks, benefits, alternatives. The primary risks include pain, bleeding, infection, damage to any of the remaining intestines, possible need for open procedure or additional procedures including but not limited to bowel resection and anastomosis. The benefits would be restoring intestinal continuity and ability to remove her colostomy. Alternatives would be to do nothing and to continue on with her end colostomy. Patient voiced understanding and desires to proceed with surgery. Plan to proceed with an attempted laparoscopic but possible open colostomy reversal on Wednesday, May 24.     Clayburn Pert, MD FACS General Surgeon  06/27/2015,3:20 PM

## 2015-07-12 NOTE — Interval H&P Note (Signed)
History and Physical Interval Note:  07/12/2015 6:48 AM  [name]  has presented today for surgery, with the diagnosis of COLOSTOMY RETRACTED  The various methods of treatment have been discussed with the patient and family. After consideration of risks, benefits and other options for treatment, the patient has consented to  Procedure(s): LAPAROSCOPIC COLOSTOMY TAKEDOWN/ REVERSAL (N/A) COLOSTOMY REVERSAL (N/A) as a surgical intervention .  [name] history has been reviewed, patient examined, no change in status, stable for surgery.  I have reviewed the patient's chart and labs.  Questions were answered to the patient's satisfaction.     Clayburn Pert

## 2015-07-12 NOTE — Progress Notes (Signed)
Quick Note:  NOTE: This forensic urine drug screen (UDS) test was conducted using a state-of-the-art ultra high performance liquid chromatography and mass spectrometry system (UPLC/MS-MS), the most sophisticated and accurate method available. UPLC/MS-MS is 1,000 times more precise and accurate than standard gas chromatography and mass spectrometry (GC/MS). This system can analyze 26 drug categories and 180 drug compounds.  The results of this test came back with unexpected findings. Unreported Hydrocodone ______

## 2015-07-12 NOTE — Transfer of Care (Signed)
Immediate Anesthesia Transfer of Care Note  Patient: [name]  Procedure(s) Performed: Procedure(s): COLOSTOMY REVERSAL (N/A) EXPLORATORY LAPAROTOMY LAPAROSCOPY DIAGNOSTIC LYSIS OF ADHESION  Patient Location: PACU  Anesthesia Type:General  Level of Consciousness: awake, alert  and oriented  Airway & Oxygen Therapy: Patient Spontanous Breathing and Patient connected to face mask oxygen  Post-op Assessment: Report given to RN  Post vital signs: Reviewed and stable  Last Vitals:  Filed Vitals:   07/12/15 0621 07/12/15 1202  BP: 159/93 148/87  Pulse: 68 85  Temp: 36.8 C 36.8 C  Resp: 18 21    Last Pain: There were no vitals filed for this visit.       Complications: No apparent anesthesia complications

## 2015-07-12 NOTE — Anesthesia Preprocedure Evaluation (Signed)
Anesthesia Evaluation  Patient identified by MRN, date of birth, ID band Patient awake    Reviewed: Allergy & Precautions, NPO status , Patient's Chart, lab work & pertinent test results  Airway Mallampati: III  TM Distance: <3 FB     Dental  (+) Teeth Intact   Pulmonary former smoker,    breath sounds clear to auscultation       Cardiovascular Exercise Tolerance: Good hypertension, Pt. on home beta blockers + dysrhythmias  Rhythm:Regular     Neuro/Psych    GI/Hepatic negative GI ROS, Neg liver ROS,   Endo/Other  diabetes, Type 2, Oral Hypoglycemic AgentsHypothyroidism Morbid obesity  Renal/GU      Musculoskeletal   Abdominal (+) + obese,   Peds  Hematology  (+) anemia ,   Anesthesia Other Findings   Reproductive/Obstetrics                             Anesthesia Physical Anesthesia Plan  ASA: III  Anesthesia Plan: General   Post-op Pain Management:    Induction: Intravenous  Airway Management Planned: Oral ETT  Additional Equipment:   Intra-op Plan:   Post-operative Plan: Extubation in OR  Informed Consent: I have reviewed the patients History and Physical, chart, labs and discussed the procedure including the risks, benefits and alternatives for the proposed anesthesia with the patient or authorized representative who has indicated his/her understanding and acceptance.     Plan Discussed with: CRNA  Anesthesia Plan Comments:         Anesthesia Quick Evaluation

## 2015-07-12 NOTE — Anesthesia Procedure Notes (Addendum)
Procedure Name: Intubation Date/Time: 07/12/2015 7:40 AM Performed by: [id number], Ranyia Witting Pre-anesthesia Checklist: Patient identified, Emergency Drugs available, Suction available, Timeout performed and Patient being monitored Patient Re-evaluated:Patient Re-evaluated prior to inductionOxygen Delivery Method: Circle system utilized Preoxygenation: Pre-oxygenation with 100% oxygen Intubation Type: IV induction and Cricoid Pressure applied Ventilation: Mask ventilation without difficulty and Oral airway inserted - appropriate to patient size Laryngoscope Size: Miller and 3 Grade View: Grade III Tube type: Oral Tube size: 7.0 mm Number of attempts: 3 Airway Equipment and Method: Oral airway and Stylet (pt easy to ventilate with Oral airway. Decent neck movement.  Severe overbite with a small mouth opening. Able to visualize cords using a Miller blade . Direct view after 2nd attempt due to awkward angles from overbite and large epiglottis) Placement Confirmation: ETT inserted through vocal cords under direct vision,  positive ETCO2,  CO2 detector and breath sounds checked- equal and bilateral Secured at: 21 cm Tube secured with: Tape Future Recommendations: Recommend- induction with short-acting agent, and alternative techniques readily available Comments: Pt easy to ventilate with oral airway.  Small mouth opening, severe overbite, and large epiglottis  made visualization with Mac difficult.  Third attempt with Miller blade allowed for direct view of cords.

## 2015-07-12 NOTE — Anesthesia Postprocedure Evaluation (Signed)
Anesthesia Post Note  Patient: [name]  Procedure(s) Performed: Procedure(s) (LRB): COLOSTOMY REVERSAL (N/A) EXPLORATORY LAPAROTOMY LAPAROSCOPY DIAGNOSTIC LYSIS OF ADHESION  Patient location during evaluation: PACU Anesthesia Type: General Level of consciousness: awake Pain management: pain level controlled Vital Signs Assessment: post-procedure vital signs reviewed and stable Respiratory status: spontaneous breathing Cardiovascular status: blood pressure returned to baseline Anesthetic complications: no    Last Vitals:  Filed Vitals:   07/12/15 0621 07/12/15 1202  BP: 159/93 148/87  Pulse: 68 85  Temp: 36.8 C 36.8 C  Resp: 18 21    Last Pain:  Filed Vitals:   07/12/15 1209  PainSc: Asleep                 VAN STAVEREN,Zachari [place]

## 2015-07-12 NOTE — Brief Op Note (Signed)
07/12/2015  11:49 AM  PATIENT:  [name]  65 y.o. female  PRE-OPERATIVE DIAGNOSIS:  COLOSTOMY RETRACTED  POST-OPERATIVE DIAGNOSIS:  COLOSTOMY RETRACTED  PROCEDURE:  Procedure(s): COLOSTOMY REVERSAL (N/A) EXPLORATORY LAPAROTOMY LAPAROSCOPY DIAGNOSTIC LYSIS OF ADHESION  SURGEON:  Surgeon(s) and Role:    * Clayburn Pert, MD - Primary    * Nestor Lewandowsky, MD - Assisting  PHYSICIAN ASSISTANT:   ASSISTANTS: Dr. Marta Lamas   ANESTHESIA:   general  EBL:  Total I/O In: 1000 [I.V.:1000] Out: 500 [Urine:400; Blood:100]  BLOOD ADMINISTERED:none  DRAINS: none   LOCAL MEDICATIONS USED:  NONE  SPECIMEN:  Source of Specimen:  excised colostomy  DISPOSITION OF SPECIMEN:  PATHOLOGY  COUNTS:  YES  TOURNIQUET:  * No tourniquets in log *  DICTATION: .Dragon Dictation  PLAN OF CARE: Admit to inpatient   PATIENT DISPOSITION:  PACU - hemodynamically stable.   Delay start of Pharmacological VTE agent (>24hrs) due to surgical blood loss or risk of bleeding: no

## 2015-07-12 NOTE — Op Note (Signed)
Pre-operative Diagnosis: Retracted in colostomy  Post-operative Diagnosis: Same  Surgeon: Clayburn Pert   Assistants: Dr. Nestor Lewandowsky  Anesthesia: General endotracheal anesthesia  ASA Class: 3  Surgeon: Clayburn Pert, MD FACS  Anesthesia: Gen. with endotracheal tube  Assistant: Dr. Genevive Bi  Procedure Details  The patient was seen again in the Holding Room. The benefits, complications, treatment options, and expected outcomes were discussed with the patient. The risks of bleeding, infection, recurrence of symptoms, failure to resolve symptoms,  bowel injury, any of which could require further surgery were reviewed with the patient.   The patient was taken to Operating Room, identified as [name] and the procedure verified.  A Time Out was held and the above information confirmed.  Prior to the induction of general anesthesia, antibiotic prophylaxis was administered. VTE prophylaxis was in place. General endotracheal anesthesia was then administered and tolerated well. After the induction, the the colostomy site was oversewn with 2-0 silk, abdomen was prepped with Chloraprep, the perirectal space was prepped with Betadine and draped in the sterile fashion. The patient was positioned in the low lithotomy position.  The procedure began with an attempted laparoscopy. This was done in the right upper quadrant in the midclavicular line 2 finger breath below the costal margin. The skin was localized with a 50-50 mixture of 1% lidocaine 0.5% Marcaine plain. The skin was then incised and using a Veress needle a pneumoperitoneum was attempted to be established. After 2 attempts there was no ability to obtain a pneumoperitoneum and the technique was changed to an Optiview approach. A 5 mm Optiview trocar was placed through the incision into the abdominal cavity. We're able to obtain access to the peritoneal cavity however there was noted to be extensive adhesive disease throughout the abdomen  and an adequate space to place any additional trochars.  At this point the decision was made to convert to an open procedure. The previous midline incision was reopened with a 10 blade scalpel and Bovie electrocautery. We carefully obtained access into the peritoneal cavity protecting the intestines as the fascia was opened using comminution of sharp dissection and Bovie electrocautery. The previous laparoscopic trocar was left in place so they could be fully identified to ensure no damage to any hollow organ. The patient did have extensive adhesive disease and an easy lysis was undertaken of the entirety of the abdomen. Starting to the patient's right upper quadrant ports are previous placed trocar the adhesions taken down until the trochars it was visualized coming through the abdominal wall and through the omentum below the level of the colon. There was no evidence of damage to the intestines and therefore the trocar was removed. We then turned our attention to the right upper quadrant and the dense adhesions were taken down until the end colostomy was able to be identified going into the abdominal wall. Prior to taking this down we then turned our attention to the pelvis and dense adhesions again were encountered which were taken down primarily sharply but also with Bovie much cautery.  After an extensive adhesio lysis the rectal stump was able to be identified and was freed from all of its adhesions to the small intestine in the pelvis. We then returned our attention to the end colostomy and an elliptical skin incision was made around the oversewn ostomy site. Using Bovie electrocautery was taken down to level of fascia that was freed off circumferentially until the colostomy could be passed into our midline incision. Again there is  noted to be extensive adhesive disease was taken down using combination of Bovie much cautery, sharp and blunt dissection. There were multiple areas of bleeding that were  encountered which were controlled with directed electrocautery and oversewn with 3-0 silk suture. After completion of the easy lysis we were noted to have adequate length of colon for our reanastomosis.  The prior in colostomy site was grasped with a bowel clamp and the end colostomy itself was sharply excised. The colon was opened and using EEA sizers and was found to accept a 60 [place] EEA size. The appropriate stapler was opened and after a pursestring suture was placed with 3-0 Prolene suture the anvil was placed into the open end of the colon and secured with pursestring suture. The remaining colon was circumferentially dissected until adequate clearance was created for the anastomosis. The rectal stump was then again visualized and adequate dissection was insured. I then proceeded to approach the rectum which was digitally dilated to accept 2 fingers and then using sequential EEA sizers was dilated until it could accept a 33 [place] EEA sizer to the point of the rectal stump. The EEA stapler was then placed in the rectum and able to be visualized and the peritoneal cavity where the spike was extended and married with the anvil and under direct insufflation was closed with the bowel noted to be of the appropriate orientation without twist or any impingement of the small bowel. The stapler was fired and able to be removed from the rectum where 2 intact doughnuts were able to be visualized.  Using a rigid sigmoidoscope gas was instilled to the rectum with our new anastomosis under saline without any evidence of air leak. We then copiously irrigated the abdomen with warm saline and removed all of her operative instruments to allow the abdomen to be redraped for a clean close. All members of the operative team broke scrub, re-scrubbed and regowned for the clean close.  The abdominal contents were returned to the abdomen and the midline fascia was closed with a running #1 looped PDS suture from cephalad to  caudad for the first half and from caudad to cephalad for the second half meeting in the middle. There was an area of no subcutaneous fat where the skin approach the fascia where flaps were created underneath the skin to allow for fascial closure underneath the skin. The fascia reapproximated with ease without any difficulty for ventilation. The suture itself was then secured down to the previous placed suture at the fascial level. The prior ostomy site was then closed with additional #1 PDS suture without difficulty. All of our skin incisions were then closed with surgical staples with the prior ostomy site being closed over a quarter-inch Penrose drain. All of her operative sites were then dressed with honeycomb dressings without difficulty.  The patient tolerated the procedure well. She was awoken from general endotracheal anesthesia and transferred to the PACU in good condition. All counts were correct at the end the procedure there were no immediate noted complications.  Findings: Retracted in colostomy   Estimated Blood Loss: 100 mL         Drains: None         Specimens: End colostomy          Complications: None                  Condition: Good   Clayburn Pert, MD, FACS

## 2015-07-13 LAB — BASIC METABOLIC PANEL
ANION GAP: 7 (ref 5–15)
BUN: 28 mg/dL — AB (ref 6–20)
CALCIUM: 7.2 mg/dL — AB (ref 8.9–10.3)
CO2: 21 mmol/L — AB (ref 22–32)
CREATININE: 2.65 mg/dL — AB (ref 0.44–1.00)
Chloride: 108 mmol/L (ref 101–111)
GFR calc Af Amer: 21 mL/min — ABNORMAL LOW (ref >60)
GFR, EST NON AFRICAN AMERICAN: 18 mL/min — AB (ref >60)
GLUCOSE: 59 mg/dL — AB (ref 65–99)
Potassium: 4.4 mmol/L (ref 3.5–5.1)
Sodium: 136 mmol/L (ref 135–145)

## 2015-07-13 LAB — CBC
HEMATOCRIT: 34.5 % — AB (ref 35.0–47.0)
Hemoglobin: 10.9 g/dL — ABNORMAL LOW (ref 12.0–16.0)
MCH: 24.7 pg — AB (ref 26.0–34.0)
MCHC: 31.5 g/dL — AB (ref 32.0–36.0)
MCV: 78.2 fL — AB (ref 80.0–100.0)
Platelets: 229 10*3/uL (ref 150–440)
RBC: 4.41 MIL/uL (ref 3.80–5.20)
RDW: 18 % — AB (ref 11.5–14.5)
WBC: 12.6 10*3/uL — ABNORMAL HIGH (ref 3.6–11.0)

## 2015-07-13 LAB — GLUCOSE, CAPILLARY
GLUCOSE-CAPILLARY: 84 mg/dL (ref 65–99)
GLUCOSE-CAPILLARY: 69 mg/dL (ref 65–99)
GLUCOSE-CAPILLARY: 91 mg/dL (ref 65–99)
Glucose-Capillary: 29 mg/dL — CL (ref 65–99)
Glucose-Capillary: 30 mg/dL — CL (ref 65–99)
Glucose-Capillary: 32 mg/dL — CL (ref 65–99)
Glucose-Capillary: 81 mg/dL (ref 65–99)
Glucose-Capillary: 79 mg/dL (ref 65–99)

## 2015-07-13 LAB — SURGICAL PATHOLOGY

## 2015-07-13 MED ORDER — ENOXAPARIN SODIUM 30 MG/0.3ML ~~LOC~~ SOLN
30.0000 mg | SUBCUTANEOUS | Status: DC
  Administered 2015-07-13 – 2015-07-18 (×6): 30 mg via SUBCUTANEOUS
  Filled 2015-07-13 (×6): qty 0.3

## 2015-07-13 MED ORDER — LACTATED RINGERS IV BOLUS (SEPSIS)
1000.0000 mL | Freq: Once | INTRAVENOUS | Status: AC
  Administered 2015-07-13: 1000 mL via INTRAVENOUS

## 2015-07-13 MED ORDER — KCL IN DEXTROSE-NACL 20-5-0.45 MEQ/L-%-% IV SOLN
INTRAVENOUS | Status: DC
Start: 2015-07-13 — End: 2015-07-14
  Administered 2015-07-13 – 2015-07-14 (×3): via INTRAVENOUS
  Filled 2015-07-13 (×6): qty 1000

## 2015-07-13 NOTE — Progress Notes (Signed)
Pt had medium size loose bowel movement today.

## 2015-07-13 NOTE — Progress Notes (Signed)
Pt had BG read of 32.  Pt showing no signs of hypoglycemia.  Pt A/O x3, denies diaphoresis, denies feeling shaky.  Pt was given 240 mL Apple Juice and BG re-checked for a read of 29.  Pt on Clear Liquids.  I contacted Dr. Adonis Huguenin to ask for x1 120 mL Orange Juice.  Orders were received to give the orange juice and that he will change the patients IV Fluids.  Will follow out orders and re-check blood sugar in 15 minutes.

## 2015-07-13 NOTE — Care Management (Signed)
POD 1- colostomy takedown.  Out of bed to chair. PCA.  Staff will continue mobilizing patient.  so far no discharge needs identified

## 2015-07-13 NOTE — Progress Notes (Signed)
1 Day Post-Op   Subjective:  Patient doing well this morning. She has had expected abdominal pain but states the pain is well controlled with PCA. She did have some low blood sugars that responded to oral interventions. Thus far tolerating her diet.  Vital signs in last 24 hours: Temp:  [98.2 F (36.8 C)-99.6 F (37.6 C)] 98.2 F (36.8 C) (05/25 1324) Pulse Rate:  [90-105] 90 (05/25 1324) Resp:  [15-26] 20 (05/25 1324) BP: (78-94)/(47-49) 84/49 mmHg (05/25 1324) SpO2:  [94 %-98 %] 95 % (05/25 1324) Last BM Date: 07/12/15  Intake/Output from previous day: 05/24 0701 - 05/25 0700 In: 2930 [P.O.:240; I.V.:2690] Out: [id number] [Urine:1470; Blood:100]  GI: Abdomen soft, appropriately tender to palpation midline, and nondistended. Dressings are well clean, dry, intact.  Lab Results:  CBC  Recent Labs  07/12/15 1410 07/13/15 0137  WBC 9.8 12.6*  HGB 11.6* 10.9*  HCT 37.3 34.5*  PLT 216 229   CMP     Component Value Date/Time   NA 136 07/13/2015 0137   NA 143 07/19/2013 0523   K 4.4 07/13/2015 0137   K 3.7 07/19/2013 0523   CL 108 07/13/2015 0137   CL 109* 07/19/2013 0523   CO2 21* 07/13/2015 0137   CO2 29 07/19/2013 0523   GLUCOSE 59* 07/13/2015 0137   GLUCOSE 110* 07/19/2013 0523   BUN 28* 07/13/2015 0137   BUN 8 07/19/2013 0523   CREATININE 2.65* 07/13/2015 0137   CREATININE 1.58* 07/19/2013 0523   CALCIUM 7.2* 07/13/2015 0137   CALCIUM 7.8* 07/19/2013 0523   PROT 6.4* 02/22/2015 0414   PROT 5.4* 07/19/2013 0523   ALBUMIN 2.2* 03/07/2015 0516   ALBUMIN 2.1* 07/19/2013 0523   AST 14* 02/22/2015 0414   AST 16 07/19/2013 0523   ALT 13* 02/22/2015 0414   ALT 7* 07/19/2013 0523   ALKPHOS 86 02/22/2015 0414   ALKPHOS 66 07/19/2013 0523   BILITOT 0.7 02/22/2015 0414   BILITOT 0.3 07/19/2013 0523   GFRNONAA 18* 07/13/2015 0137   GFRNONAA 34* 07/19/2013 0523   GFRAA 21* 07/13/2015 0137   GFRAA 40* 07/19/2013 0523   PT/INR No results for input(s): LABPROT, INR in  the last 72 hours.  Studies/Results: No results found.  Assessment/Plan: 65 year old female postop day #1 status post colostomy reversal. Doing well. Encourage ambulation, incentive from her usage. If continues to tolerate a diet tomorrow we will plan to transition to oral pain medications in order to help wean off of the PCA.   Clayburn Pert, MD FACS General Surgeon  07/13/2015

## 2015-07-13 NOTE — Progress Notes (Signed)
Pharmacy Note - Anticoagulation Monitoring  Patient with orders for enoxaparin 40mg  SQ Q24H for VTE prophylaxis  Estimated Creatinine Clearance: 22.9 mL/min (by C-G formula based on Cr of 2.65).  Will reduce to enoxaparin 30mg  SQ Q24H for CrCl < 62ml/min per anticoagulation policy.  Rexene Edison, PharmD Clinical Pharmacist  07/13/2015 7:42 AM

## 2015-07-13 NOTE — Progress Notes (Signed)
Inpatient Diabetes Program Recommendations  AACE/ADA: New Consensus Statement on Inpatient Glycemic Control (2015)  Target Ranges:  Prepandial:   less than 140 mg/dL      Peak postprandial:   less than 180 mg/dL (1-2 hours)      Critically ill patients:  140 - 180 mg/dL   Review of Glycemic Control:  Results for SUN, LALLO (MRN [id number]) as of 07/13/2015 11:17  Ref. Range 07/12/2015 12:08 07/12/2015 16:53 07/12/2015 21:41 07/13/2015 07:31 07/13/2015 07:57 07/13/2015 08:28  Glucose-Capillary Latest Ref Range: 65-99 mg/dL 129 (H) 141 (H) 71 32 (LL) 29 (LL) 30 (LL)    Diabetes history: Type 2 diabetes Outpatient Diabetes medications: Lantus 10 units q HS, Amaryl 4 mg 2 times daily Current orders for Inpatient glycemic control:  Novolog moderate tid with meals  Inpatient Diabetes Program Recommendations:    Consider holding Novolog correction until blood sugars greater than 150 mg/dL.  Also may consider reducing Novolog correction scale to sensitive.  Will call RN to discuss.   Thanks, Adah Perl, RN, BC-ADM Inpatient Diabetes Coordinator Pager [phone] (8a-5p)

## 2015-07-14 LAB — CBC
HCT: 32 % — ABNORMAL LOW (ref 35.0–47.0)
HEMOGLOBIN: 10 g/dL — AB (ref 12.0–16.0)
MCH: 25 pg — ABNORMAL LOW (ref 26.0–34.0)
MCHC: 31.2 g/dL — ABNORMAL LOW (ref 32.0–36.0)
MCV: 80.2 fL (ref 80.0–100.0)
PLATELETS: 209 10*3/uL (ref 150–440)
RBC: 3.99 MIL/uL (ref 3.80–5.20)
RDW: 17.6 % — ABNORMAL HIGH (ref 11.5–14.5)
WBC: 10.6 10*3/uL (ref 3.6–11.0)

## 2015-07-14 LAB — BASIC METABOLIC PANEL
ANION GAP: 8 (ref 5–15)
BUN: 33 mg/dL — ABNORMAL HIGH (ref 6–20)
CHLORIDE: 104 mmol/L (ref 101–111)
CO2: 19 mmol/L — ABNORMAL LOW (ref 22–32)
Calcium: 6.6 mg/dL — ABNORMAL LOW (ref 8.9–10.3)
Creatinine, Ser: 3.5 mg/dL — ABNORMAL HIGH (ref 0.44–1.00)
GFR calc Af Amer: 15 mL/min — ABNORMAL LOW (ref >60)
GFR, EST NON AFRICAN AMERICAN: 13 mL/min — AB (ref >60)
GLUCOSE: 117 mg/dL — AB (ref 65–99)
POTASSIUM: 4.9 mmol/L (ref 3.5–5.1)
SODIUM: 131 mmol/L — AB (ref 135–145)

## 2015-07-14 LAB — GLUCOSE, CAPILLARY
GLUCOSE-CAPILLARY: 121 mg/dL — AB (ref 65–99)
GLUCOSE-CAPILLARY: 130 mg/dL — AB (ref 65–99)
Glucose-Capillary: 132 mg/dL — ABNORMAL HIGH (ref 65–99)
Glucose-Capillary: 124 mg/dL — ABNORMAL HIGH (ref 65–99)

## 2015-07-14 LAB — URIC ACID: URIC ACID, SERUM: 7.6 mg/dL — AB (ref 2.3–6.6)

## 2015-07-14 LAB — SODIUM, URINE, RANDOM: Sodium, Ur: 20 mmol/L

## 2015-07-14 MED ORDER — HYDROCODONE-ACETAMINOPHEN 5-325 MG PO TABS
1.0000 | ORAL_TABLET | Freq: Four times a day (QID) | ORAL | Status: DC | PRN
  Administered 2015-07-14 – 2015-07-15 (×2): 1 via ORAL
  Administered 2015-07-16 – 2015-07-17 (×4): 2 via ORAL
  Administered 2015-07-17 (×2): 1 via ORAL
  Administered 2015-07-18 – 2015-07-19 (×3): 2 via ORAL
  Administered 2015-07-19 – 2015-07-20 (×2): 1 via ORAL
  Administered 2015-07-20: 2 via ORAL
  Administered 2015-07-21: 1 via ORAL
  Administered 2015-07-21 – 2015-07-22 (×2): 2 via ORAL
  Filled 2015-07-14: qty 1
  Filled 2015-07-14 (×3): qty 2
  Filled 2015-07-14: qty 1
  Filled 2015-07-14: qty 2
  Filled 2015-07-14 (×3): qty 1
  Filled 2015-07-14 (×4): qty 2
  Filled 2015-07-14: qty 1
  Filled 2015-07-14 (×2): qty 2
  Filled 2015-07-14: qty 1

## 2015-07-14 MED ORDER — MORPHINE SULFATE (PF) 4 MG/ML IV SOLN
4.0000 mg | INTRAVENOUS | Status: DC | PRN
Start: 2015-07-14 — End: 2015-07-22
  Administered 2015-07-14 – 2015-07-20 (×10): 4 mg via INTRAVENOUS
  Filled 2015-07-14 (×10): qty 1

## 2015-07-14 MED ORDER — DIPHENHYDRAMINE HCL 12.5 MG/5ML PO ELIX
12.5000 mg | ORAL_SOLUTION | Freq: Four times a day (QID) | ORAL | Status: DC | PRN
  Administered 2015-07-14 – 2015-07-15 (×3): 12.5 mg via ORAL
  Filled 2015-07-14 (×4): qty 5

## 2015-07-14 MED ORDER — DULOXETINE HCL 30 MG PO CPEP
30.0000 mg | ORAL_CAPSULE | Freq: Every day | ORAL | Status: DC
  Administered 2015-07-14 – 2015-07-22 (×9): 30 mg via ORAL
  Filled 2015-07-14 (×9): qty 1

## 2015-07-14 MED ORDER — DEXTROSE-NACL 5-0.9 % IV SOLN
INTRAVENOUS | Status: DC
  Administered 2015-07-14 – 2015-07-15 (×3): via INTRAVENOUS

## 2015-07-14 MED ORDER — LEVOTHYROXINE SODIUM 88 MCG PO TABS
88.0000 ug | ORAL_TABLET | ORAL | Status: DC
  Administered 2015-07-14 – 2015-07-22 (×9): 88 ug via ORAL
  Filled 2015-07-14 (×9): qty 1

## 2015-07-14 MED ORDER — ATORVASTATIN CALCIUM 20 MG PO TABS
40.0000 mg | ORAL_TABLET | Freq: Every day | ORAL | Status: DC
  Administered 2015-07-14 – 2015-07-21 (×8): 40 mg via ORAL
  Filled 2015-07-14 (×5): qty 2
  Filled 2015-07-14: qty 1
  Filled 2015-07-14 (×3): qty 2

## 2015-07-14 MED ORDER — DIPHENHYDRAMINE HCL 50 MG/ML IJ SOLN: 12.5000 mg | Freq: Four times a day (QID) | INTRAMUSCULAR | Status: DC | PRN

## 2015-07-14 NOTE — Care Management Important Message (Signed)
Important Message  Patient Details  Name: [name] MRN: [id number] Date of Birth: November 08, 1950   Medicare Important Message Given:  Yes    Katrina Stack, RN 07/14/2015, 3:15 PM

## 2015-07-14 NOTE — Consult Note (Signed)
Medical Consultation  [name] [id number] DOB: 05/01/1950 DOA: 07/12/2015 PCP: Adin Hector, MD   Requesting physician: Dr Adonis Huguenin Date of consultation:  07/14/2015 Reason for consultation: renal injury  Impression/Recommendations  65 year old female with a history of moderate aortic valve stenosis, chronic kidney disease stage III and diabetes postop day #2 status post colostomy reversal now with acute kidney injury.  1. Acute kidney injury on chronic kidney disease stage III: I am suspecting this is due to ATN. Patient had episodes of hypotension 2 days ago which is a likely etiology of ATN and acute kidney injury. There is no acute signs of uremia and need for hemodialysis. Order urine sodium and uric acid level. Bladder scan Nephrology consultation Continue to hold/avoid nephrotoxic agents Continue IV fluids although would discontinue D5 half-normal saline with potassium and change to D5 normal saline.  2. Diabetes: Blood sugars have been low. Continue Accu-Cheks with sliding scale insulin and D5 Continue to hold Lantus and Amaryl  3. Essential hypertension: Blood pressure is low normal and therefore would continue to hold metoprolol and Micardis. Monitor blood pressure for the need to restart metoprolol. Once blood pressure and kidney function improves consider restarting Thomas R 10  4. Hypothyroidism: Restart Synthroid  5. Hyperlipidemia: Restart Lipitor.  6. Depression: Restart Cymbalta  7. Hyponatremia: Repeat sodium level in a.m. I suspect sodium level should normalize now that IV fluids have been changed.   Chief Complaint:  Colostomy reversal   HPI:  This is a very pleasant 65 year old female with a history of hypertension, hypothyroid, diabetes and aortic valve stenosis who presented for colostomy reversal. During her course in the hospital her creatinine has slowly been increasing. Hospitalist service was contacted for further evaluation. Patient has no  signs of uremia such as shortness of breath, lower extremity edema, or Confusion. She has had good urine output. It is noted to day's ago her blood pressure was low.  Review of Systems  Constitutional: Negative for fever, chills weight loss HENT: Negative for ear pain, nosebleeds, congestion, facial swelling, rhinorrhea, neck pain, neck stiffness and ear discharge.   Respiratory: Negative for cough, shortness of breath, wheezing  Cardiovascular: Negative for chest pain, palpitations and leg swelling.  Gastrointestinal: Negative for heartburn, vomiting, diarrhea or consitpation. She had 2 bowel movements yesterday. She is mild abdominal pain from her surgery.  Genitourinary: Negative for dysuria, urgency, frequency, hematuria Musculoskeletal: Negative for back pain or joint pain Neurological: Negative for dizziness, seizures, syncope, focal weakness,  numbness and headaches.  Hematological: Does not bruise/bleed easily.  Psychiatric/Behavioral: Negative for hallucinations, confusion, dysphoric mood   Past Medical History  Diagnosis Date  . Diabetes mellitus without complication ([place])   . GI bleed   . Aortic valve stenosis   . Hyperlipidemia   . Hypertension   . Diverticulitis large intestine   . Obesity   . Shortness of breath dyspnea   . Anxiety   . Neuropathy in diabetes ([place])   . Anemia   . Vertigo   . Bowel obstruction ([place])   . Hypothyroidism   . Arthritis     feet, legs  . Colostomy in place [facility])   . MRSA (methicillin resistant Staphylococcus aureus)     at abdominal wound.  Jan 2017.  Treated.   Marland Kitchen Heart murmur   . CKD (chronic kidney disease)     protein in urine  . Ectopic atrial tachycardia ([place])   . Dysrhythmia   . Neuropathy ([place])   . Gastritis  Past Surgical History  Procedure Laterality Date  . Colectomy    . Laparotomy closure of cecal perforation  05/09/2013    Dr. Marina Gravel  . Tonsillectomy    . Back surgery      spur frmoved from lower back  .  Cardiac catheterization    . Abdominal hysterectomy    . Laparotomy N/A 02/06/2015    Procedure: Laparotomy, reduction of incarcerated parastomal hernia, repair of parastomal hernia with mesh;  Surgeon: Sherri Rad, MD;  Location: [facility];  Service: General;  Laterality: N/A;  . Debridement of abdominal wall abscess N/A 02/22/2015    Procedure: DEBRIDEMENT OF ABDOMINAL WALL ABSCESS;  Surgeon: Clayburn Pert, MD;  Location: [facility];  Service: General;  Laterality: N/A;  . Excision mass abdominal N/A 02/24/2015    Procedure: EXCISION MASS ABDOMINAL  / [place];  Surgeon: Clayburn Pert, MD;  Location: [facility];  Service: General;  Laterality: N/A;  . Application of wound vac N/A 02/24/2015    Procedure: APPLICATION OF WOUND VAC;  Surgeon: Clayburn Pert, MD;  Location: [facility];  Service: General;  Laterality: N/A;  . Excision mass abdominal N/A 02/26/2015    Procedure: EXCISION MASS ABDOMINAL/wash out;  Surgeon: Clayburn Pert, MD;  Location: [facility];  Service: General;  Laterality: N/A;  . Application of wound vac N/A 02/26/2015    Procedure: APPLICATION OF WOUND VAC;  Surgeon: Clayburn Pert, MD;  Location: [facility];  Service: General;  Laterality: N/A;  . Wound debridement N/A 03/03/2015    Procedure: DEBRIDEMENT ABDOMINAL WOUND;  Surgeon: Florene Glen, MD;  Location: [facility];  Service: General;  Laterality: N/A;  . Wound debridement N/A 03/09/2015    Procedure: DEBRIDEMENT ABDOMINAL WOUND;  Surgeon: Florene Glen, MD;  Location: [facility];  Service: General;  Laterality: N/A;  . Flexible sigmoidoscopy  06/19/2015    Procedure: FLEXIBLE SIGMOIDOSCOPY;  Surgeon: Lucilla Lame, MD;  Location: [place];  Service: Endoscopy;;  UNABLE TO ACCESS OSTOMY SITE FOR ACCESS INTO COLON  . Colostomy reversal N/A 07/12/2015    Procedure: COLOSTOMY REVERSAL;  Surgeon: Clayburn Pert, MD;  Location: [facility];  Service: General;  Laterality: N/A;  . Laparotomy  07/12/2015    Procedure: EXPLORATORY  LAPAROTOMY;  Surgeon: Clayburn Pert, MD;  Location: [facility];  Service: General;;  . Laparoscopy  07/12/2015    Procedure: LAPAROSCOPY DIAGNOSTIC;  Surgeon: Clayburn Pert, MD;  Location: [facility];  Service: General;;  . Lysis of adhesion  07/12/2015    Procedure: LYSIS OF ADHESION;  Surgeon: Clayburn Pert, MD;  Location: [facility];  Service: General;;   Social History:  reports that she quit smoking about 2 years ago. Her smoking use included Cigarettes. She has a .25 pack-year smoking history. She has never used smokeless tobacco. She reports that she does not drink alcohol or use illicit drugs.  Allergies  Allergen Reactions  . Buspirone Other (See Comments)    Weakness  . Citalopram Other (See Comments)  . Lisinopril Cough  . Metformin Diarrhea    At 1000 mg dose  . Pravastatin Other (See Comments)    insomnia  . Sitagliptin Other (See Comments)    constipation  . Tramadol Itching  . Diltiazem Hcl Palpitations  . Gabapentin Palpitations  . Hydralazine Rash  . Lovastatin Palpitations   Family History  Problem Relation Age of Onset  . Diabetes Mother   . Hypertension Father     Prior to Admission medications   Medication Sig Start Date End  Date Taking? Authorizing Provider  atorvastatin (LIPITOR) 40 MG tablet Take 40 mg by mouth at bedtime. 07/22/14  Yes Historical Provider, MD  calcium carbonate (TUMS - DOSED IN MG ELEMENTAL CALCIUM) 500 MG chewable tablet Chew 1 tablet by mouth as needed for indigestion or heartburn.   Yes Historical Provider, MD  DULoxetine (CYMBALTA) 30 MG capsule Take 30 mg by mouth daily.  08/12/14  Yes Historical Provider, MD  glimepiride (AMARYL) 4 MG tablet Take 4 mg by mouth 2 (two) times daily. 1 tab AM, 1/2 tab PM 08/12/14  Yes Historical Provider, MD  insulin glargine (LANTUS) 100 UNIT/ML injection Inject 10 Units into the skin at bedtime.   Yes Historical Provider, MD  levothyroxine (SYNTHROID, LEVOTHROID) 88 MCG tablet Take 88 mcg by mouth every  morning.    Yes Historical Provider, MD  metoprolol succinate (TOPROL-XL) 25 MG 24 hr tablet Take 25 mg by mouth daily.   Yes Historical Provider, MD  telmisartan (MICARDIS) 80 MG tablet Take 80 mg by mouth daily.   Yes Historical Provider, MD  traZODone (DESYREL) 50 MG tablet Take 50 mg by mouth at bedtime. As needed.   Yes Historical Provider, MD    Physical Exam: Blood pressure 112/63, pulse 99, temperature 99.6 F (37.6 C), temperature source Oral, resp. rate 13, height 5\' 3"  (1.6 m), weight 92.987 kg (205 lb), SpO2 96 %. @VITALS2 @ [employer]   07/12/15 0621  Weight: 92.987 kg (205 lb)    Intake/Output Summary (Last 24 hours) at 07/14/15 1027 Last data filed at 07/14/15 0835  Gross per 24 hour  Intake   3838 ml  Output    701 ml  Net   3137 ml     Constitutional: Appears well-developed and well-nourished. No distress. HENT: Normocephalic. Marland Kitchen Oropharynx is clear and moist.  Eyes: Conjunctivae and EOM are normal. PERRLA, no scleral icterus.  Neck: Normal ROM. Neck supple. No JVD. No tracheal deviation. CVS: RRR, S1/S2 +, 4/6 holosystolic murmurs which radiates to carotids, no gallops, no carotid bruit.  Pulmonary: Effort and breath sounds normal, no stridor, rhonchi, wheezes, rales.  Abdominal: Soft.  good bowel sounds. Patient has honeycomb dressing with dry blood there is  no distension,  rebound or guarding.  there is generalized tenderness  Musculoskeletal: Normal range of motion. No edema and no tenderness.  Neuro: Alert. CN 2-12 grossly intact. No focal deficits. Skin: Skin is warm and dry. No rash noted. Psychiatric: Normal mood and affect.    Labs  Basic Metabolic Panel:  Recent Labs Lab 07/14/15 0459  NA 131*  K 4.9  CL 104  CO2 19*  GLUCOSE 117*  BUN 33*  CREATININE 3.50*  CALCIUM 6.6*   Liver Function Tests: No results for input(s): AST, ALT, ALKPHOS, BILITOT, PROT, ALBUMIN in the last 168 hours. No results for input(s): LIPASE, AMYLASE in the  last 168 hours.  CBC:  Recent Labs Lab 07/14/15 0459  WBC 10.6  HGB 10.0*  HCT 32.0*  MCV 80.2  PLT 209   Cardiac Enzymes: No results for input(s): CKTOTAL, CKMB, CKMBINDEX, TROPONINI in the last 168 hours. BNP: Invalid input(s): POCBNP CBG:  Recent Labs Lab 07/14/15 0733  GLUCAP 121*    Radiological Exams: No results found.     Thank you for allowing me to participate in the care of your patient. We will continue to follow.   Note: This dictation was prepared with Dragon dictation along with smaller phrase technology. Any transcriptional errors that result from this process  are unintentional.  Time spent: 50 minutes discussed with Dr Adonis Huguenin and family  Athan Casalino, Ulice Bold, MD

## 2015-07-14 NOTE — Care Management (Signed)
Advancing diet.  Transition PCA to oral pain meds.  Medicine consult for rising BUN and Creatinine.

## 2015-07-14 NOTE — Progress Notes (Signed)
2 Days Post-Op   Subjective:  Patient reports doing well today. Has been tolerating clear liquid diet without nausea or vomiting. She had a small amount of stool per rectum but denies passing any flatus. Her pain has been well controlled.  Vital signs in last 24 hours: Temp:  [98.3 F (36.8 C)-100.5 F (38.1 C)] 99.7 F (37.6 C) (05/26 1340) Pulse Rate:  [67-99] 67 (05/26 1340) Resp:  [13-24] 17 (05/26 1340) BP: (104-117)/(56-86) 117/86 mmHg (05/26 1340) SpO2:  [90 %-100 %] 90 % (05/26 1340) FiO2 (%):  [94 %] 94 % (05/25 1951) Last BM Date: 07/13/15  Intake/Output from previous day: 05/25 0701 - 05/26 0700 In: 4373 [P.O.:1120; I.V.:3253] Out: 101 [Urine:100; Stool:1]  GI: Her abdomen is soft, purple tender to palpation along the incision sites, nondistended. Staples well approximated at all incision sites without any evidence of erythema or purulence.  Lab Results:  CBC  Recent Labs  07/13/15 0137 07/14/15 0459  WBC 12.6* 10.6  HGB 10.9* 10.0*  HCT 34.5* 32.0*  PLT 229 209   CMP     Component Value Date/Time   NA 131* 07/14/2015 0459   NA 143 07/19/2013 0523   K 4.9 07/14/2015 0459   K 3.7 07/19/2013 0523   CL 104 07/14/2015 0459   CL 109* 07/19/2013 0523   CO2 19* 07/14/2015 0459   CO2 29 07/19/2013 0523   GLUCOSE 117* 07/14/2015 0459   GLUCOSE 110* 07/19/2013 0523   BUN 33* 07/14/2015 0459   BUN 8 07/19/2013 0523   CREATININE 3.50* 07/14/2015 0459   CREATININE 1.58* 07/19/2013 0523   CALCIUM 6.6* 07/14/2015 0459   CALCIUM 7.8* 07/19/2013 0523   PROT 6.4* 02/22/2015 0414   PROT 5.4* 07/19/2013 0523   ALBUMIN 2.2* 03/07/2015 0516   ALBUMIN 2.1* 07/19/2013 0523   AST 14* 02/22/2015 0414   AST 16 07/19/2013 0523   ALT 13* 02/22/2015 0414   ALT 7* 07/19/2013 0523   ALKPHOS 86 02/22/2015 0414   ALKPHOS 66 07/19/2013 0523   BILITOT 0.7 02/22/2015 0414   BILITOT 0.3 07/19/2013 0523   GFRNONAA 13* 07/14/2015 0459   GFRNONAA 34* 07/19/2013 0523   GFRAA 15*  07/14/2015 0459   GFRAA 40* 07/19/2013 0523   PT/INR No results for input(s): LABPROT, INR in the last 72 hours.  Studies/Results: No results found.  Assessment/Plan: 65 year old female 2 days status post colostomy reversal. She has had a gradual worsening in her renal function so an internal medicine consultation was requested today. Appreciate their assistance with management of her chronic problems. Patient continues to do well from a postsurgical standpoint. Plan to transition her off of her PCA today. Encourage ambulation, incentive spirometer usage. Dressing changes daily and on an as-needed basis.   Clayburn Pert, MD FACS General Surgeon  07/14/2015

## 2015-07-15 LAB — GLUCOSE, CAPILLARY
GLUCOSE-CAPILLARY: 117 mg/dL — AB (ref 65–99)
GLUCOSE-CAPILLARY: 160 mg/dL — AB (ref 65–99)
GLUCOSE-CAPILLARY: 151 mg/dL — AB (ref 65–99)
Glucose-Capillary: 117 mg/dL — ABNORMAL HIGH (ref 65–99)

## 2015-07-15 LAB — BASIC METABOLIC PANEL
ANION GAP: 5 (ref 5–15)
BUN: 30 mg/dL — ABNORMAL HIGH (ref 6–20)
CALCIUM: 6.9 mg/dL — AB (ref 8.9–10.3)
CO2: 18 mmol/L — AB (ref 22–32)
Chloride: 111 mmol/L (ref 101–111)
Creatinine, Ser: 3.24 mg/dL — ABNORMAL HIGH (ref 0.44–1.00)
GFR, EST AFRICAN AMERICAN: 16 mL/min — AB (ref >60)
GFR, EST NON AFRICAN AMERICAN: 14 mL/min — AB (ref >60)
Glucose, Bld: 141 mg/dL — ABNORMAL HIGH (ref 65–99)
Potassium: 5.3 mmol/L — ABNORMAL HIGH (ref 3.5–5.1)
SODIUM: 134 mmol/L — AB (ref 135–145)

## 2015-07-15 MED ORDER — SODIUM CHLORIDE 0.9 % IV SOLN
INTRAVENOUS | Status: DC
  Administered 2015-07-15 – 2015-07-20 (×8): via INTRAVENOUS

## 2015-07-15 NOTE — Progress Notes (Signed)
[facility] [place] NAME: [name]    MR#:  [id number]  DATE OF BIRTH:  1950-11-22  SUBJECTIVE:   Medical consult follow-up for management of medical problems. Overall feels better   CHIEF COMPLAINT:  No chief complaint on file.   REVIEW OF SYSTEMS:    Review of Systems  Constitutional: Negative for fever and chills.  HENT: Negative for hearing loss.   Eyes: Negative for blurred vision, double vision and photophobia.  Respiratory: Negative for cough, hemoptysis and shortness of breath.   Cardiovascular: Negative for palpitations, orthopnea and leg swelling.  Gastrointestinal: Negative for vomiting, abdominal pain and diarrhea.  Genitourinary: Negative for dysuria and urgency.  Musculoskeletal: Negative for myalgias and neck pain.  Skin: Negative for rash.  Neurological: Negative for dizziness, focal weakness, seizures, weakness and headaches.  Psychiatric/Behavioral: Negative for memory loss. The patient does not have insomnia.     Nutrition: Tolerating Diet: Tolerating PT:      DRUG ALLERGIES:   Allergies  Allergen Reactions  . Buspirone Other (See Comments)    Weakness  . Citalopram Other (See Comments)  . Lisinopril Cough  . Metformin Diarrhea    At 1000 mg dose  . Pravastatin Other (See Comments)    insomnia  . Sitagliptin Other (See Comments)    constipation  . Tramadol Itching  . Diltiazem Hcl Palpitations  . Gabapentin Palpitations  . Hydralazine Rash  . Lovastatin Palpitations    VITALS:  Blood pressure 112/71, pulse 74, temperature 97.9 F (36.6 C), temperature source Oral, resp. rate 14, height 5\' 3"  (1.6 m), weight 92.987 kg (205 lb), SpO2 95 %.  PHYSICAL EXAMINATION:   Physical Exam  GENERAL:  65 y.o.-year-old patient lying in the bed with no acute distress.  EYES: Pupils equal, round, reactive to light and accommodation. No scleral icterus. Extraocular muscles intact.  HEENT: Head  atraumatic, normocephalic. Oropharynx and nasopharynx clear.  NECK:  Supple, no jugular venous distention. No thyroid enlargement, no tenderness.  LUNGS: Normal breath sounds bilaterally, no wheezing, rales,rhonchi or crepitation. No use of accessory muscles of respiration.  CARDIOVASCULAR: S1, S2 normal. No murmurs, rubs, or gallops.  ABDOMEN: Soft, nontender, nondistended. Bowel sounds present. No organomegaly or mass. incision  site is clean. No erythema. penrose drain is in place. EXTREMITIES: No pedal edema, cyanosis, or clubbing.  NEUROLOGIC: Cranial nerves II through XII are intact. Muscle strength 5/5 in all extremities. Sensation intact. Gait not checked.  PSYCHIATRIC: The patient is alert and oriented x 3.  SKIN: No obvious rash, lesion, or ulcer.    LABORATORY PANEL:   CBC  Recent Labs Lab 07/14/15 0459  WBC 10.6  HGB 10.0*  HCT 32.0*  PLT 209   ------------------------------------------------------------------------------------------------------------------  Chemistries   Recent Labs Lab 07/15/15 0446  NA 134*  K 5.3*  CL 111  CO2 18*  GLUCOSE 141*  BUN 30*  CREATININE 3.24*  CALCIUM 6.9*   ------------------------------------------------------------------------------------------------------------------  Cardiac Enzymes No results for input(s): TROPONINI in the last 168 hours. ------------------------------------------------------------------------------------------------------------------  RADIOLOGY:  No results found.   ASSESSMENT AND PLAN:   Active Problems:   H/O colostomy   Acute renal failure on chronic renal failure with chronic kidney disease stage III: ATN likely due to hypotension Improving with IV hydration. #2 diabetes mellitus type 2: resolved  hypoglycemia. Tolerating the clear liquids. I will change the fluids to normal saline. #3. Essential hypertension: Because blood pressure was low normal metoprolol, Micardis was held yesterday.  bp  still soft. Recommend continue to hold them, #3 hypothyroidism    All the records are reviewed and case discussed with Care Management/Social Workerr. Management plans discussed with the patient, family and they are in agreement.  CODE STATUS: full  TOTAL TIME TAKING CARE OF THIS PATIENT: 35  minutes.   POSSIBLE D/C IN  1 -2 DAYS, DEPENDING ON CLINICAL CONDITION.   Epifanio Lesches M.D on 07/15/2015 at 1:05 PM  Between 7am to 6pm - Pager - [phone]  After 6pm go to [email or web address] - password [email or web address] [facility]  [facility]  Office  [phone]  CC: Primary care physician; Adin Hector, MD

## 2015-07-15 NOTE — Progress Notes (Signed)
3 Days Post-Op   Subjective:  Patient without complaints morning. States she's had bowel function and denies any nausea or vomiting. She is tolerating clear liquid diet, ambulating and using her incentive spirometer. She's been very happy with her care thus far.  Vital signs in last 24 hours: Temp:  [98.1 F (36.7 C)-99.7 F (37.6 C)] 98.1 F (36.7 C) (05/27 0457) Pulse Rate:  [67-125] 74 (05/27 0500) Resp:  [14-18] 18 (05/27 0457) BP: (95-117)/(62-86) 98/62 mmHg (05/27 0457) SpO2:  [90 %-100 %] 98 % (05/27 0457) Last BM Date: 07/15/15  Intake/Output from previous day: 05/26 0701 - 05/27 0700 In: 2812 [P.O.:720; I.V.:2092] Out: 3450 [Urine:3450]  GI: Abdomen is large, soft, appropriately tender to palpation at incision sites. Staples remain in place without any evidence of erythema or purulence. Penrose  drain in place at the site of prior colostomy with appropriate dressing was drainage.   Lab Results:  CBC  Recent Labs  07/13/15 0137 07/14/15 0459  WBC 12.6* 10.6  HGB 10.9* 10.0*  HCT 34.5* 32.0*  PLT 229 209   CMP     Component Value Date/Time   NA 134* 07/15/2015 0446   NA 143 07/19/2013 0523   K 5.3* 07/15/2015 0446   K 3.7 07/19/2013 0523   CL 111 07/15/2015 0446   CL 109* 07/19/2013 0523   CO2 18* 07/15/2015 0446   CO2 29 07/19/2013 0523   GLUCOSE 141* 07/15/2015 0446   GLUCOSE 110* 07/19/2013 0523   BUN 30* 07/15/2015 0446   BUN 8 07/19/2013 0523   CREATININE 3.24* 07/15/2015 0446   CREATININE 1.58* 07/19/2013 0523   CALCIUM 6.9* 07/15/2015 0446   CALCIUM 7.8* 07/19/2013 0523   PROT 6.4* 02/22/2015 0414   PROT 5.4* 07/19/2013 0523   ALBUMIN 2.2* 03/07/2015 0516   ALBUMIN 2.1* 07/19/2013 0523   AST 14* 02/22/2015 0414   AST 16 07/19/2013 0523   ALT 13* 02/22/2015 0414   ALT 7* 07/19/2013 0523   ALKPHOS 86 02/22/2015 0414   ALKPHOS 66 07/19/2013 0523   BILITOT 0.7 02/22/2015 0414   BILITOT 0.3 07/19/2013 0523   GFRNONAA 14* 07/15/2015 0446   GFRNONAA 34* 07/19/2013 0523   GFRAA 16* 07/15/2015 0446   GFRAA 40* 07/19/2013 0523   PT/INR No results for input(s): LABPROT, INR in the last 72 hours.  Studies/Results: No results found.  Assessment/Plan: 65 year old female status post open colostomy reversal. Continues to improve. Plan to advance her diet to regular today. Her renal function has slightly improved from yesterday but remains diminished. Appreciate internal medicine assistance. We will continue her IV fluids to assist with her renal function. Encourage ambulation and incentive spirometry usage. Doing well.   Clayburn Pert, MD FACS General Surgeon  07/15/2015

## 2015-07-15 NOTE — Progress Notes (Signed)
[name]  ROUNDING NOTE   Subjective:  Patient well known to [place] from prior admission. She presented this time for colostomy reversal She underwent this on 07/12/15. Neck slight she has known underlying chronic kidney disease. Her baseline creatinine appears to be 1.6. Upon presentation creatinine was 2.06. It rose to 3.50 on 07/14/15. Currently creatinine is down to 3.24. Potassium also mildly high at 5.3. Patient has had some periods of postoperative hypotension.  Objective:  Vital signs in last 24 hours:  Temp:  [97.9 F (36.6 C)-98.3 F (36.8 C)] 97.9 F (36.6 C) (05/27 1232) Pulse Rate:  [73-125] 74 (05/27 1232) Resp:  [14-18] 14 (05/27 1232) BP: (95-112)/(62-71) 112/71 mmHg (05/27 1232) SpO2:  [95 %-98 %] 95 % (05/27 1232)  Weight change:  Filed Weights   07/12/15 0621  Weight: 92.987 kg (205 lb)    Intake/Output: I/O last 3 completed shifts: In: 4009 [P.O.:720; I.V.:3289] Out: 3450 [Urine:3450]   Intake/Output this shift:  Total I/O In: 2392.1 [P.O.:990; I.V.:1402.1] Out: 1050 [Urine:1050]  Physical Exam: General: NAD, resting in bed  Head: Normocephalic, atraumatic. Moist oral mucosal membranes  Eyes: Anicteric  Neck: Supple, trachea midline  Lungs:  Clear to auscultation, normal effort  Heart: S1S2 no rubs  Abdomen:  Soft, tender near incision.  BS present  Extremities:  no peripheral edema.  Neurologic: Nonfocal, moving all four extremities  Skin: No lesions       Basic Metabolic Panel:  Recent Labs Lab 07/12/15 1410 07/13/15 0137 07/14/15 0459 07/15/15 0446  NA  --  136 131* 134*  K  --  4.4 4.9 5.3*  CL  --  108 104 111  CO2  --  21* 19* 18*  GLUCOSE  --  59* 117* 141*  BUN  --  28* 33* 30*  CREATININE 2.00* 2.65* 3.50* 3.24*  CALCIUM  --  7.2* 6.6* 6.9*    Liver Function Tests: No results for input(s): AST, ALT, ALKPHOS, BILITOT, PROT, ALBUMIN in the last 168 hours. No results for input(s): LIPASE, AMYLASE in the  last 168 hours. No results for input(s): AMMONIA in the last 168 hours.  CBC:  Recent Labs Lab 07/12/15 1410 07/13/15 0137 07/14/15 0459  WBC 9.8 12.6* 10.6  HGB 11.6* 10.9* 10.0*  HCT 37.3 34.5* 32.0*  MCV 80.0 78.2* 80.2  PLT 216 229 209    Cardiac Enzymes: No results for input(s): CKTOTAL, CKMB, CKMBINDEX, TROPONINI in the last 168 hours.  BNP: Invalid input(s): POCBNP  CBG:  Recent Labs Lab 07/14/15 1133 07/14/15 1633 07/14/15 2044 07/15/15 0738 07/15/15 1127  GLUCAP 130* 132* 58* 117* 160*    Microbiology: Results for orders placed or performed during the hospital encounter of 07/07/15  Surgical pcr screen     Status: Abnormal   Collection Time: 07/07/15  1:51 PM  Result Value Ref Range Status   MRSA, PCR NEGATIVE NEGATIVE Final   Staphylococcus aureus POSITIVE (A) NEGATIVE Final    Comment:        The Xpert SA Assay (FDA approved for NASAL specimens in patients over 53 years of age), is one component of a comprehensive surveillance program.  Test performance has been validated by [facility] for patients greater than or equal to 87 year old. It is not intended to diagnose infection nor to guide or monitor treatment.     Coagulation Studies: No results for input(s): LABPROT, INR in the last 72 hours.  Urinalysis: No results for input(s): COLORURINE, LABSPEC, [place], [place], [place],  BILIRUBINUR, KETONESUR, PROTEINUR, UROBILINOGEN, NITRITE, LEUKOCYTESUR in the last 72 hours.  Invalid input(s): APPERANCEUR    Imaging: No results found.   Medications:   . sodium chloride 100 mL/hr at 07/15/15 1326   . atorvastatin  40 mg Oral QHS  . DULoxetine  30 mg Oral Daily  . enoxaparin (LOVENOX) injection  30 mg Subcutaneous Q24H  . insulin aspart  0-15 Units Subcutaneous TID WC  . levothyroxine  88 mcg Oral BH-q7a  . pantoprazole (PROTONIX) IV  40 mg Intravenous QHS   diphenhydrAMINE **OR** diphenhydrAMINE, HYDROcodone-acetaminophen, morphine  injection  Assessment/ Plan:  65 y.o. 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   1. Acute renal failure/chronic kidney disease stage III. [name] baseline creatinine appears to be 1.6. There was some perioperative hypotension which is likely lead to acute renal failure. We will proceed with renal ultrasound and check urinalysis as well as urine protein to creatinine ratio. Continue IV fluid hydration for now. No acute indication for dialysis as the patient is making plenty of urine.  2. Anemia of chronic kidney disease. Hemoglobin currently 10.0 and acceptable. Continue to monitor CBC. No indication for Procrit at the moment.  3. Hyperkalemia. Patient has mild hyperkalemia with a serum potassium of 5.3. Continue IV fluid hydration for now. We will hold off on Kayexalate given recent gastrointestinal surgery.   LOS: 3 Jhade Berko 5/27/20173:12 PM

## 2015-07-16 ENCOUNTER — Inpatient Hospital Stay: Payer: PPO

## 2015-07-16 LAB — GLUCOSE, CAPILLARY
GLUCOSE-CAPILLARY: 140 mg/dL — AB (ref 65–99)
Glucose-Capillary: 109 mg/dL — ABNORMAL HIGH (ref 65–99)
Glucose-Capillary: 128 mg/dL — ABNORMAL HIGH (ref 65–99)
Glucose-Capillary: 108 mg/dL — ABNORMAL HIGH (ref 65–99)

## 2015-07-16 LAB — BASIC METABOLIC PANEL
ANION GAP: 7 (ref 5–15)
BUN: 27 mg/dL — ABNORMAL HIGH (ref 6–20)
CALCIUM: 7.5 mg/dL — AB (ref 8.9–10.3)
CO2: 18 mmol/L — ABNORMAL LOW (ref 22–32)
CREATININE: 3 mg/dL — AB (ref 0.44–1.00)
Chloride: 113 mmol/L — ABNORMAL HIGH (ref 101–111)
GFR, EST AFRICAN AMERICAN: 18 mL/min — AB (ref >60)
GFR, EST NON AFRICAN AMERICAN: 15 mL/min — AB (ref >60)
Glucose, Bld: 116 mg/dL — ABNORMAL HIGH (ref 65–99)
Potassium: 4.7 mmol/L (ref 3.5–5.1)
SODIUM: 138 mmol/L (ref 135–145)

## 2015-07-16 NOTE — Progress Notes (Signed)
[name]  ROUNDING NOTE   Subjective:  Cr down to 3.00 but still above baseline. Pt has good appetite.  Urine outpt 1 liter over the preceding 24 hours.    Objective:  Vital signs in last 24 hours:  Temp:  [97.3 F (36.3 C)-97.8 F (36.6 C)] 97.7 F (36.5 C) (05/28 1333) Pulse Rate:  [66-114] 114 (05/28 1333) Resp:  [18-20] 18 (05/28 1333) BP: (118-145)/(69-80) 142/80 mmHg (05/28 1333) SpO2:  [97 %-100 %] 100 % (05/28 1333)  Weight change:  Filed Weights   07/12/15 0621  Weight: 92.987 kg (205 lb)    Intake/Output: I/O last 3 completed shifts: In: 3549.1 [P.O.:1230; I.V.:2319.1] Out: 2650 [Urine:2650]   Intake/Output this shift:  Total I/O In: 1230 [P.O.:480; I.V.:750] Out: -   Physical Exam: General: NAD, resting in bed  Head: Normocephalic, atraumatic. Moist oral mucosal membranes  Eyes: Anicteric  Neck: Supple, trachea midline  Lungs:  Clear to auscultation, normal effort  Heart: S1S2 no rubs  Abdomen:  Soft, tender near incision.  BS present  Extremities:  no peripheral edema.  Neurologic: Nonfocal, moving all four extremities  Skin: No lesions       Basic Metabolic Panel:  Recent Labs Lab 07/12/15 1410  07/13/15 0137 07/14/15 0459 07/15/15 0446 07/16/15 0740  NA  --   --  136 131* 134* 138  K  --   --  4.4 4.9 5.3* 4.7  CL  --   --  108 104 111 113*  CO2  --   --  21* 19* 18* 18*  GLUCOSE  --   --  59* 117* 141* 116*  BUN  --   --  28* 33* 30* 27*  CREATININE 2.00*  --  2.65* 3.50* 3.24* 3.00*  CALCIUM  --   < > 7.2* 6.6* 6.9* 7.5*  < > = values in this interval not displayed.  Liver Function Tests: No results for input(s): AST, ALT, ALKPHOS, BILITOT, PROT, ALBUMIN in the last 168 hours. No results for input(s): LIPASE, AMYLASE in the last 168 hours. No results for input(s): AMMONIA in the last 168 hours.  CBC:  Recent Labs Lab 07/12/15 1410 07/13/15 0137 07/14/15 0459  WBC 9.8 12.6* 10.6  HGB 11.6* 10.9* 10.0*   HCT 37.3 34.5* 32.0*  MCV 80.0 78.2* 80.2  PLT 216 229 209    Cardiac Enzymes: No results for input(s): CKTOTAL, CKMB, CKMBINDEX, TROPONINI in the last 168 hours.  BNP: Invalid input(s): POCBNP  CBG:  Recent Labs Lab 07/15/15 1127 07/15/15 1640 07/15/15 2110 07/16/15 0747 07/16/15 1147  GLUCAP 160* 117* 151* 109* 140*    Microbiology: Results for orders placed or performed during the hospital encounter of 07/07/15  Surgical pcr screen     Status: Abnormal   Collection Time: 07/07/15  1:51 PM  Result Value Ref Range Status   MRSA, PCR NEGATIVE NEGATIVE Final   Staphylococcus aureus POSITIVE (A) NEGATIVE Final    Comment:        The Xpert SA Assay (FDA approved for NASAL specimens in patients over 26 years of age), is one component of a comprehensive surveillance program.  Test performance has been validated by [facility] for patients greater than or equal to 43 year old. It is not intended to diagnose infection nor to guide or monitor treatment.     Coagulation Studies: No results for input(s): LABPROT, INR in the last 72 hours.  Urinalysis: No results for input(s): COLORURINE, LABSPEC, [place], [place], [place],  BILIRUBINUR, KETONESUR, PROTEINUR, UROBILINOGEN, NITRITE, LEUKOCYTESUR in the last 72 hours.  Invalid input(s): APPERANCEUR    Imaging: US Renal  07/16/2015  CLINICAL DATA:  Acute renal failure. EXAM: RENAL / URINARY TRACT ULTRASOUND COMPLETE COMPARISON:  02/09/2015. FINDINGS: Right Kidney: Length: 10.3 cm. Mildly echogenic. No mass or hydronephrosis visualized. Left Kidney: Length: 9.4 cm. Mildly echogenic. No mass or hydronephrosis visualized. Bladder: Appears normal for degree of bladder distention. IMPRESSION: Mildly echogenic kidneys, compatible with medical renal disease. No hydronephrosis. Electronically Signed   By: Claudie Revering M.D.   On: 07/16/2015 14:09     Medications:   . sodium chloride 100 mL/hr at 07/15/15 1326   . atorvastatin   40 mg Oral QHS  . DULoxetine  30 mg Oral Daily  . enoxaparin (LOVENOX) injection  30 mg Subcutaneous Q24H  . insulin aspart  0-15 Units Subcutaneous TID WC  . levothyroxine  88 mcg Oral BH-q7a  . pantoprazole (PROTONIX) IV  40 mg Intravenous QHS   diphenhydrAMINE **OR** diphenhydrAMINE, HYDROcodone-acetaminophen, morphine injection  Assessment/ Plan:  65 y.o. 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   1. Acute renal failure/chronic kidney disease stage III. [name] baseline creatinine appears to be 1.6.  Acute renal failure due to perioperative hypotension. Ultrasound shows mildly echogenic kidneys. - Continue IV hydration for now, follow renal function trend over the next several days.  2. Anemia of chronic kidney disease. Last hgb was 10, no indication for procrit, continue to montior.  3. Hyperkalemia. Potassium down to 4.7. Continue to monitor.   LOS: 4 Osa Fogarty 5/28/20172:55 PM

## 2015-07-16 NOTE — Progress Notes (Signed)
4 Days Post-Op   Subjective:  Patient reports no acute complaints this morning. She has been tolerating a diet and having bowel function.  Vital signs in last 24 hours: Temp:  [97.3 F (36.3 C)-97.8 F (36.6 C)] 97.7 F (36.5 C) (05/28 1333) Pulse Rate:  [66-114] 114 (05/28 1333) Resp:  [18-20] 18 (05/28 1333) BP: (118-145)/(69-80) 142/80 mmHg (05/28 1333) SpO2:  [97 %-100 %] 100 % (05/28 1333) Last BM Date: 07/16/15  Intake/Output from previous day: 05/27 0701 - 05/28 0700 In: 3162.1 [P.O.:1230; I.V.:1932.1] Out: 1050 [Urine:1050]  GI: Abdomen is large, soft, purple tender to palpation at her incision sites. Well approximated staple lines without evidence of purulence or erythema. Penrose drain remains in place to left upper quadrant prior ostomy site.  Lab Results:  CBC  Recent Labs  07/14/15 0459  WBC 10.6  HGB 10.0*  HCT 32.0*  PLT 209   CMP     Component Value Date/Time   NA 138 07/16/2015 0740   NA 143 07/19/2013 0523   K 4.7 07/16/2015 0740   K 3.7 07/19/2013 0523   CL 113* 07/16/2015 0740   CL 109* 07/19/2013 0523   CO2 18* 07/16/2015 0740   CO2 29 07/19/2013 0523   GLUCOSE 116* 07/16/2015 0740   GLUCOSE 110* 07/19/2013 0523   BUN 27* 07/16/2015 0740   BUN 8 07/19/2013 0523   CREATININE 3.00* 07/16/2015 0740   CREATININE 1.58* 07/19/2013 0523   CALCIUM 7.5* 07/16/2015 0740   CALCIUM 7.8* 07/19/2013 0523   PROT 6.4* 02/22/2015 0414   PROT 5.4* 07/19/2013 0523   ALBUMIN 2.2* 03/07/2015 0516   ALBUMIN 2.1* 07/19/2013 0523   AST 14* 02/22/2015 0414   AST 16 07/19/2013 0523   ALT 13* 02/22/2015 0414   ALT 7* 07/19/2013 0523   ALKPHOS 86 02/22/2015 0414   ALKPHOS 66 07/19/2013 0523   BILITOT 0.7 02/22/2015 0414   BILITOT 0.3 07/19/2013 0523   GFRNONAA 15* 07/16/2015 0740   GFRNONAA 34* 07/19/2013 0523   GFRAA 18* 07/16/2015 0740   GFRAA 40* 07/19/2013 0523   PT/INR No results for input(s): LABPROT, INR in the last 72  hours.  Studies/Results: US Renal  07/16/2015  CLINICAL DATA:  Acute renal failure. EXAM: RENAL / URINARY TRACT ULTRASOUND COMPLETE COMPARISON:  02/09/2015. FINDINGS: Right Kidney: Length: 10.3 cm. Mildly echogenic. No mass or hydronephrosis visualized. Left Kidney: Length: 9.4 cm. Mildly echogenic. No mass or hydronephrosis visualized. Bladder: Appears normal for degree of bladder distention. IMPRESSION: Mildly echogenic kidneys, compatible with medical renal disease. No hydronephrosis. Electronically Signed   By: Claudie Revering M.D.   On: 07/16/2015 14:09    Assessment/Plan: 65 year old female status post open colostomy reversal. Doing well. Having a very slow return of renal function. Appreciate internal medicine and nephrology consultations for this assistance. Encourage ambulation, incentive from her usage, oral intake. Once renal function has returned closer to baseline she would hopefully then be ready for discharge.   Clayburn Pert, MD FACS General Surgeon  07/16/2015

## 2015-07-16 NOTE — Progress Notes (Signed)
[facility] [facility] [place] NAME: [name]    MR#:  [id number]  DATE OF BIRTH:  1950/03/01  SUBJECTIVE:   Medical consult follow-up for management of medical problems.  Sitting in the chair, denies any complaints. No shortness of breath.  CHIEF COMPLAINT:  No chief complaint on file.   REVIEW OF SYSTEMS:    Review of Systems  Constitutional: Negative for fever and chills.  HENT: Negative for hearing loss.   Eyes: Negative for blurred vision, double vision and photophobia.  Respiratory: Negative for cough, hemoptysis and shortness of breath.   Cardiovascular: Negative for palpitations, orthopnea and leg swelling.  Gastrointestinal: Negative for vomiting, abdominal pain and diarrhea.  Genitourinary: Negative for dysuria and urgency.  Musculoskeletal: Negative for myalgias and neck pain.  Skin: Negative for rash.  Neurological: Negative for dizziness, focal weakness, seizures, weakness and headaches.  Psychiatric/Behavioral: Negative for memory loss. The patient does not have insomnia.     Nutrition: Tolerating Diet: Tolerating PT:      DRUG ALLERGIES:   Allergies  Allergen Reactions  . Buspirone Other (See Comments)    Weakness  . Citalopram Other (See Comments)  . Lisinopril Cough  . Metformin Diarrhea    At 1000 mg dose  . Pravastatin Other (See Comments)    insomnia  . Sitagliptin Other (See Comments)    constipation  . Tramadol Itching  . Diltiazem Hcl Palpitations  . Gabapentin Palpitations  . Hydralazine Rash  . Lovastatin Palpitations    VITALS:  Blood pressure 118/69, pulse 66, temperature 97.8 F (36.6 C), temperature source Oral, resp. rate 18, height 5\' 3"  (1.6 m), weight 92.987 kg (205 lb), SpO2 100 %.  PHYSICAL EXAMINATION:   Physical Exam  GENERAL:  65 y.o.-year-old patient lying in the bed with no acute distress.  EYES: Pupils equal, round, reactive to light and accommodation. No scleral  icterus. Extraocular muscles intact.  HEENT: Head atraumatic, normocephalic. Oropharynx and nasopharynx clear.  NECK:  Supple, no jugular venous distention. No thyroid enlargement, no tenderness.  LUNGS: Normal breath sounds bilaterally, no wheezing, rales,rhonchi or crepitation. No use of accessory muscles of respiration.  CARDIOVASCULAR: S1, S2 normal. No murmurs, rubs, or gallops.  ABDOMEN: Soft, nontender, nondistended. Bowel sounds present. No organomegaly or mass. incision  site is clean. No erythema. penrose drain is in place. EXTREMITIES: No pedal edema, cyanosis, or clubbing.  NEUROLOGIC: Cranial nerves II through XII are intact. Muscle strength 5/5 in all extremities. Sensation intact. Gait not checked.  PSYCHIATRIC: The patient is alert and oriented x 3.  SKIN: No obvious rash, lesion, or ulcer.    LABORATORY PANEL:   CBC  Recent Labs Lab 07/14/15 0459  WBC 10.6  HGB 10.0*  HCT 32.0*  PLT 209   ------------------------------------------------------------------------------------------------------------------  Chemistries   Recent Labs Lab 07/16/15 0740  NA 138  K 4.7  CL 113*  CO2 18*  GLUCOSE 116*  BUN 27*  CREATININE 3.00*  CALCIUM 7.5*   ------------------------------------------------------------------------------------------------------------------  Cardiac Enzymes No results for input(s): TROPONINI in the last 168 hours. ------------------------------------------------------------------------------------------------------------------  RADIOLOGY:  No results found.   ASSESSMENT AND PLAN:   Active Problems:   H/O colostomy   Acute renal failure on chronic renal failure with chronic kidney disease stage III: ATN likely due to hypotension Improving with IV hydration. Renal function improved creatinine down to 3. I nephrologist recommended obtaining an ultrasound. Continue IV hydration. #2 diabetes mellitus type 2: resolved  hypoglycemia. Tolerating  the clear liquids.  I#3. Essential hypertension:  Blood pressure improved..Micardis at 40 mg daily but continue to hold Toprol-XL. #3 hypothyroidism  #4 diabetes mellitus type 2: Hypoglycemia improved. Continue sliding scale with coverage only. Because blood sugar is less than 150.  Levemir, Amaryl is on hold. Need to hold them because blood sugar is still  In 100 s.   All the records are reviewed and case discussed with Care Management/Social Workerr. Management plans discussed with the patient, family and they are in agreement.  CODE STATUS: full  TOTAL TIME TAKING CARE OF THIS PATIENT: 35  minutes.   POSSIBLE D/C IN  1 -2 DAYS, DEPENDING ON CLINICAL CONDITION.   Epifanio Lesches M.D on 07/16/2015 at 11:07 AM  Between 7am to 6pm - Pager - [phone]  After 6pm go to [email or web address] - password [email or web address]  [facility]  Office  [phone]  CC: Primary care physician; Adin Hector, MD

## 2015-07-17 LAB — BASIC METABOLIC PANEL
Anion gap: 4 — ABNORMAL LOW (ref 5–15)
BUN: 25 mg/dL — AB (ref 6–20)
CALCIUM: 7.7 mg/dL — AB (ref 8.9–10.3)
CO2: 20 mmol/L — AB (ref 22–32)
CREATININE: 2.89 mg/dL — AB (ref 0.44–1.00)
Chloride: 117 mmol/L — ABNORMAL HIGH (ref 101–111)
GFR calc Af Amer: 19 mL/min — ABNORMAL LOW (ref >60)
GFR, EST NON AFRICAN AMERICAN: 16 mL/min — AB (ref >60)
GLUCOSE: 115 mg/dL — AB (ref 65–99)
Potassium: 4.9 mmol/L (ref 3.5–5.1)
Sodium: 141 mmol/L (ref 135–145)

## 2015-07-17 LAB — GLUCOSE, CAPILLARY
GLUCOSE-CAPILLARY: 111 mg/dL — AB (ref 65–99)
Glucose-Capillary: 94 mg/dL (ref 65–99)
Glucose-Capillary: 147 mg/dL — ABNORMAL HIGH (ref 65–99)
Glucose-Capillary: 139 mg/dL — ABNORMAL HIGH (ref 65–99)

## 2015-07-17 MED ORDER — METOPROLOL TARTRATE 5 MG/5ML IV SOLN
10.0000 mg | INTRAVENOUS | Status: DC | PRN
  Administered 2015-07-17 – 2015-07-18 (×2): 10 mg via INTRAVENOUS
  Filled 2015-07-17 (×3): qty 10
  Filled 2015-07-17: qty 5

## 2015-07-17 MED ORDER — METOPROLOL SUCCINATE ER 25 MG PO TB24
25.0000 mg | ORAL_TABLET | Freq: Every day | ORAL | Status: DC
  Administered 2015-07-17 – 2015-07-18 (×2): 25 mg via ORAL
  Filled 2015-07-17 (×2): qty 1

## 2015-07-17 NOTE — Progress Notes (Signed)
[place] at [place] NAME: [name]    MR#:  [id number]  DATE OF BIRTH:  04/20/1950  SUBJECTIVE:   Medical consult follow-up for management of medical problems.   Patient renal function is improving. Overall feels better, denies any abdominal pain or nausea or vomiting.   CHIEF COMPLAINT:  No chief complaint on file.   REVIEW OF SYSTEMS:    Review of Systems  Constitutional: Negative for fever and chills.  HENT: Negative for hearing loss.   Eyes: Negative for blurred vision, double vision and photophobia.  Respiratory: Negative for cough, hemoptysis and shortness of breath.   Cardiovascular: Negative for palpitations, orthopnea and leg swelling.  Gastrointestinal: Negative for vomiting, abdominal pain and diarrhea.  Genitourinary: Negative for dysuria and urgency.  Musculoskeletal: Negative for myalgias and neck pain.  Skin: Negative for rash.  Neurological: Negative for dizziness, focal weakness, seizures, weakness and headaches.  Psychiatric/Behavioral: Negative for memory loss. The patient does not have insomnia.     Nutrition: Tolerating Diet: Tolerating PT:      DRUG ALLERGIES:   Allergies  Allergen Reactions  . Buspirone Other (See Comments)    Weakness  . Citalopram Other (See Comments)  . Lisinopril Cough  . Metformin Diarrhea    At 1000 mg dose  . Pravastatin Other (See Comments)    insomnia  . Sitagliptin Other (See Comments)    constipation  . Tramadol Itching  . Diltiazem Hcl Palpitations  . Gabapentin Palpitations  . Hydralazine Rash  . Lovastatin Palpitations    VITALS:  Blood pressure 166/91, pulse 84, temperature 98 F (36.7 C), temperature source Oral, resp. rate 18, height 5\' 3"  (1.6 m), weight 92.987 kg (205 lb), SpO2 98 %.  PHYSICAL EXAMINATION:   Physical Exam  GENERAL:  65 y.o.-year-old patient lying in the bed with no acute distress.  EYES: Pupils equal, round, reactive to  light and accommodation. No scleral icterus. Extraocular muscles intact.  HEENT: Head atraumatic, normocephalic. Oropharynx and nasopharynx clear.  NECK:  Supple, no jugular venous distention. No thyroid enlargement, no tenderness.  LUNGS: Normal breath sounds bilaterally, no wheezing, rales,rhonchi or crepitation. No use of accessory muscles of respiration.  CARDIOVASCULAR: S1, S2 normal. No murmurs, rubs, or gallops.  ABDOMEN: Soft, nontender, nondistended. Bowel sounds present. No organomegaly or mass. incision  site is clean. No erythema. penrose drain is in place. EXTREMITIES: No pedal edema, cyanosis, or clubbing.  NEUROLOGIC: Cranial nerves II through XII are intact. Muscle strength 5/5 in all extremities. Sensation intact. Gait not checked.  PSYCHIATRIC: The patient is alert and oriented x 3.  SKIN: No obvious rash, lesion, or ulcer.    LABORATORY PANEL:   CBC  Recent Labs Lab 07/14/15 0459  WBC 10.6  HGB 10.0*  HCT 32.0*  PLT 209   ------------------------------------------------------------------------------------------------------------------  Chemistries   Recent Labs Lab 07/17/15 0449  NA 141  K 4.9  CL 117*  CO2 20*  GLUCOSE 115*  BUN 25*  CREATININE 2.89*  CALCIUM 7.7*   ------------------------------------------------------------------------------------------------------------------  Cardiac Enzymes No results for input(s): TROPONINI in the last 168 hours. ------------------------------------------------------------------------------------------------------------------  RADIOLOGY:  US Renal  07/16/2015  CLINICAL DATA:  Acute renal failure. EXAM: RENAL / URINARY TRACT ULTRASOUND COMPLETE COMPARISON:  02/09/2015. FINDINGS: Right Kidney: Length: 10.3 cm. Mildly echogenic. No mass or hydronephrosis visualized. Left Kidney: Length: 9.4 cm. Mildly echogenic. No mass or hydronephrosis visualized. Bladder: Appears normal for degree of bladder distention.  IMPRESSION: Mildly echogenic kidneys,  compatible with medical renal disease. No hydronephrosis. Electronically Signed   By: Claudie Revering M.D.   On: 07/16/2015 14:09     ASSESSMENT AND PLAN:   Active Problems:   H/O colostomy   Acute renal failure on chronic renal failure with chronic kidney disease stage III: ATN likely due to hypotension Improving with IV hydration. Renal function improved creatinine down to 2.89. I renal ultrasound showed medical renal disease. No obstruction., baseline creatinine is 1.6.   #2 diabetes mellitus type 2: resolved  hypoglycemia. Tolerating the clear liquids.  I#3. Essential hypertension:  Blood pressure improved..Micardis at 40 mg daily . Dr. back on Toprol-XL today 25 mg daily. #3 hypothyroidism  #4 diabetes mellitus type 2: Hypoglycemia improved. Continue sliding scale with coverage. Change it to regular diet today. Blood sugar is still below 150. Hold the Levemir, oral diabetic medications.  discharge plan depends upon continued improvement in kidney function.  All the records are reviewed and case discussed with Care Management/Social Workerr. Management plans discussed with the patient, family and they are in agreement.  CODE STATUS: full  TOTAL TIME TAKING CARE OF THIS PATIENT: 35  minutes.   POSSIBLE D/C IN  1 -2 DAYS, DEPENDING ON CLINICAL CONDITION.   Epifanio Lesches M.D on 07/17/2015 at 12:55 PM  Between 7am to 6pm - Pager - [phone]  After 6pm go to [email or web address] - password [email or web address] [facility]  [place] [facility]  Office  [phone]  CC: Primary care physician; Adin Hector, MD

## 2015-07-17 NOTE — Care Management (Signed)
Barrier to discharge- BUN and Creatinine.  They are trending down but still much above her baseline.  Nephrology following.  Tolerating diet, pain under control and ambulatory

## 2015-07-17 NOTE — Progress Notes (Signed)
S/p colostomy takedown POD # 5 AKI creat slowly coming down Good U/O Having BM and tolerating PO Still feels weak  PE NAD Abd: soft, minimal tenderness, incision c/d/i, no infection  A/p Doing well AKI improving May DC home in 1-2 days once creat stabilizes and ok w nephrology

## 2015-07-17 NOTE — Progress Notes (Signed)
[name]  ROUNDING NOTE   Subjective:  Creatinine trending down very slowly. Creatinine currently 2.89. Patient still has a good appetite. Urine output noted as being 300 cc over the preceding 24 hours however question as to whether there is some incontinence.   Objective:  Vital signs in last 24 hours:  Temp:  [97.8 F (36.6 C)-98.2 F (36.8 C)] 98 F (36.7 C) (05/29 1236) Pulse Rate:  [82-89] 84 (05/29 1236) Resp:  [18-20] 18 (05/29 0524) BP: (146-166)/(80-117) 166/91 mmHg (05/29 1236) SpO2:  [98 %-100 %] 98 % (05/29 1236)  Weight change:  Filed Weights   07/12/15 0621  Weight: 92.987 kg (205 lb)    Intake/Output: I/O last 3 completed shifts: In: 2831.7 [P.O.:840; I.V.:1991.7] Out: 600 [Urine:600]   Intake/Output this shift:  Total I/O In: 240 [P.O.:240] Out: 0   Physical Exam: General: NAD, resting in bed  Head: Normocephalic, atraumatic. Moist oral mucosal membranes  Eyes: Anicteric  Neck: Supple, trachea midline  Lungs:  Clear to auscultation, normal effort  Heart: S1S2 no rubs  Abdomen:  Soft, tender near incision.  BS present  Extremities:  no peripheral edema.  Neurologic: Nonfocal, moving all four extremities  Skin: No lesions       Basic Metabolic Panel:  Recent Labs Lab 07/13/15 0137 07/14/15 0459 07/15/15 0446 07/16/15 0740 07/17/15 0449  NA 136 131* 134* 138 141  K 4.4 4.9 5.3* 4.7 4.9  CL 108 104 111 113* 117*  CO2 21* 19* 18* 18* 20*  GLUCOSE 59* 117* 141* 116* 115*  BUN 28* 33* 30* 27* 25*  CREATININE 2.65* 3.50* 3.24* 3.00* 2.89*  CALCIUM 7.2* 6.6* 6.9* 7.5* 7.7*    Liver Function Tests: No results for input(s): AST, ALT, ALKPHOS, BILITOT, PROT, ALBUMIN in the last 168 hours. No results for input(s): LIPASE, AMYLASE in the last 168 hours. No results for input(s): AMMONIA in the last 168 hours.  CBC:  Recent Labs Lab 07/12/15 1410 07/13/15 0137 07/14/15 0459  WBC 9.8 12.6* 10.6  HGB 11.6* 10.9* 10.0*   HCT 37.3 34.5* 32.0*  MCV 80.0 78.2* 80.2  PLT 216 229 209    Cardiac Enzymes: No results for input(s): CKTOTAL, CKMB, CKMBINDEX, TROPONINI in the last 168 hours.  BNP: Invalid input(s): POCBNP  CBG:  Recent Labs Lab 07/16/15 1147 07/16/15 1639 07/16/15 2122 07/17/15 0740 07/17/15 1121  GLUCAP 140* 128* 108* 55 147*    Microbiology: Results for orders placed or performed during the hospital encounter of 07/07/15  Surgical pcr screen     Status: Abnormal   Collection Time: 07/07/15  1:51 PM  Result Value Ref Range Status   MRSA, PCR NEGATIVE NEGATIVE Final   Staphylococcus aureus POSITIVE (A) NEGATIVE Final    Comment:        The Xpert SA Assay (FDA approved for NASAL specimens in patients over 68 years of age), is one component of a comprehensive surveillance program.  Test performance has been validated by [facility] for patients greater than or equal to 91 year old. It is not intended to diagnose infection nor to guide or monitor treatment.     Coagulation Studies: No results for input(s): LABPROT, INR in the last 72 hours.  Urinalysis: No results for input(s): COLORURINE, LABSPEC, PHURINE, GLUCOSEU, HGBUR, BILIRUBINUR, KETONESUR, PROTEINUR, UROBILINOGEN, NITRITE, LEUKOCYTESUR in the last 72 hours.  Invalid input(s): APPERANCEUR    Imaging: US Renal  07/16/2015  CLINICAL DATA:  Acute renal failure. EXAM: RENAL / URINARY TRACT ULTRASOUND  COMPLETE COMPARISON:  02/09/2015. FINDINGS: Right Kidney: Length: 10.3 cm. Mildly echogenic. No mass or hydronephrosis visualized. Left Kidney: Length: 9.4 cm. Mildly echogenic. No mass or hydronephrosis visualized. Bladder: Appears normal for degree of bladder distention. IMPRESSION: Mildly echogenic kidneys, compatible with medical renal disease. No hydronephrosis. Electronically Signed   By: Claudie Revering M.D.   On: 07/16/2015 14:09     Medications:   . sodium chloride 100 mL/hr at 07/17/15 1038   . atorvastatin   40 mg Oral QHS  . DULoxetine  30 mg Oral Daily  . enoxaparin (LOVENOX) injection  30 mg Subcutaneous Q24H  . insulin aspart  0-15 Units Subcutaneous TID WC  . levothyroxine  88 mcg Oral BH-q7a  . metoprolol succinate  25 mg Oral Daily  . pantoprazole (PROTONIX) IV  40 mg Intravenous QHS   diphenhydrAMINE **OR** diphenhydrAMINE, HYDROcodone-acetaminophen, morphine injection  Assessment/ Plan:  65 y.o. 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   1. Acute renal failure/chronic kidney disease stage III. [name] baseline creatinine appears to be 1.6.  Acute renal failure due to perioperative hypotension. Ultrasound shows mildly echogenic kidneys. - Creatinine currently down to 2.89. Continue IV fluid hydration with 0.9 normal saline for now. She will need outpatient follow-up for her underlying chronic kidney disease as well.  2. Anemia of chronic kidney disease. At last check hemoglobin was 10.0. Continue to monitor periodically.  3. Hyperkalemia. Potassium has normalized at 4.9. Continue to periodically monitor.   LOS: 5 Jamani Eley 5/29/20172:24 PM

## 2015-07-17 NOTE — Care Management Important Message (Signed)
Important Message  Patient Details  Name: SWETA MAIDA MRN: [id number] Date of Birth: 07-13-1950   Medicare Important Message Given:  Yes    [name] A Orianna Biskup 07/17/2015, 1:39 PM

## 2015-07-18 LAB — BASIC METABOLIC PANEL
ANION GAP: 6 (ref 5–15)
BUN: 22 mg/dL — ABNORMAL HIGH (ref 6–20)
CO2: 19 mmol/L — ABNORMAL LOW (ref 22–32)
Calcium: 8.1 mg/dL — ABNORMAL LOW (ref 8.9–10.3)
Chloride: 115 mmol/L — ABNORMAL HIGH (ref 101–111)
Creatinine, Ser: 2.46 mg/dL — ABNORMAL HIGH (ref 0.44–1.00)
GFR calc Af Amer: 23 mL/min — ABNORMAL LOW (ref >60)
GFR, EST NON AFRICAN AMERICAN: 19 mL/min — AB (ref >60)
GLUCOSE: 110 mg/dL — AB (ref 65–99)
POTASSIUM: 5.4 mmol/L — AB (ref 3.5–5.1)
SODIUM: 140 mmol/L (ref 135–145)

## 2015-07-18 LAB — URINALYSIS COMPLETE WITH MICROSCOPIC (ARMC ONLY)
BILIRUBIN URINE: NEGATIVE
Glucose, UA: NEGATIVE mg/dL
KETONES UR: NEGATIVE mg/dL
LEUKOCYTES UA: NEGATIVE
Nitrite: NEGATIVE
Protein, ur: 30 mg/dL — AB
SPECIFIC GRAVITY, URINE: 1.005 (ref 1.005–1.030)
pH: 5 (ref 5.0–8.0)

## 2015-07-18 LAB — CBC
HCT: 28 % — ABNORMAL LOW (ref 35.0–47.0)
Hemoglobin: 8.8 g/dL — ABNORMAL LOW (ref 12.0–16.0)
MCH: 24.6 pg — ABNORMAL LOW (ref 26.0–34.0)
MCHC: 31.5 g/dL — ABNORMAL LOW (ref 32.0–36.0)
MCV: 78.1 fL — AB (ref 80.0–100.0)
PLATELETS: 277 10*3/uL (ref 150–440)
RBC: 3.58 MIL/uL — ABNORMAL LOW (ref 3.80–5.20)
RDW: 17.4 % — ABNORMAL HIGH (ref 11.5–14.5)
WBC: 11.1 10*3/uL — ABNORMAL HIGH (ref 3.6–11.0)

## 2015-07-18 LAB — PROTEIN / CREATININE RATIO, URINE
Creatinine, Urine: 26 mg/dL
PROTEIN CREATININE RATIO: 1.81 mg/mg{creat} — AB (ref 0.00–0.15)
TOTAL PROTEIN, URINE: 47 mg/dL

## 2015-07-18 LAB — GLUCOSE, CAPILLARY
Glucose-Capillary: 99 mg/dL (ref 65–99)
Glucose-Capillary: 135 mg/dL — ABNORMAL HIGH (ref 65–99)
Glucose-Capillary: 99 mg/dL (ref 65–99)
Glucose-Capillary: 101 mg/dL — ABNORMAL HIGH (ref 65–99)

## 2015-07-18 LAB — MAGNESIUM: MAGNESIUM: 1.8 mg/dL (ref 1.7–2.4)

## 2015-07-18 MED ORDER — PANTOPRAZOLE SODIUM 40 MG PO TBEC
40.0000 mg | DELAYED_RELEASE_TABLET | Freq: Every day | ORAL | Status: DC
  Administered 2015-07-18 – 2015-07-21 (×4): 40 mg via ORAL
  Filled 2015-07-18 (×4): qty 1

## 2015-07-18 MED ORDER — SODIUM BICARBONATE 650 MG PO TABS
650.0000 mg | ORAL_TABLET | Freq: Two times a day (BID) | ORAL | Status: DC
  Administered 2015-07-18 – 2015-07-22 (×9): 650 mg via ORAL
  Filled 2015-07-18 (×9): qty 1

## 2015-07-18 MED ORDER — METOPROLOL SUCCINATE ER 50 MG PO TB24
50.0000 mg | ORAL_TABLET | Freq: Every day | ORAL | Status: DC
  Administered 2015-07-19: 50 mg via ORAL
  Filled 2015-07-18: qty 1

## 2015-07-18 NOTE — Care Management (Signed)
Creatinine continues to trend down but very slowly.  Adding sodium bicarb to treatment regime.

## 2015-07-18 NOTE — Progress Notes (Signed)
6 Days Post-Op  Subjective: Patient feels better today and is tolerating a regular diet. Seen gas no nausea or vomiting.  Objective: Vital signs in last 24 hours: Temp:  [98 F (36.7 C)-98.6 F (37 C)] 98.6 F (37 C) (05/30 0535) Pulse Rate:  [73-93] 83 (05/30 0535) Resp:  [20-22] 20 (05/30 0535) BP: (160-205)/(82-126) 164/82 mmHg (05/30 0535) SpO2:  [91 %-98 %] 91 % (05/30 0535) Last BM Date: 07/16/15  Intake/Output from previous day: 05/29 0701 - 05/30 0700 In: 2939 [P.O.:960; I.V.:1979] Out: 4001 [Urine:4001] Intake/Output this shift: Total I/O In: 449 [P.O.:240; I.V.:209] Out: 500 [Urine:500]  Physical exam:  Awake alert oriented comfortable. Soft nontender abdomen wounds are clean without erythema Penrose is present in the colostomy site wound. Minimal purulence present no erythema.  Lab Results: CBC   Recent Labs  07/18/15 0445  WBC 11.1*  HGB 8.8*  HCT 28.0*  PLT 277   BMET  Recent Labs  07/17/15 0449 07/18/15 0445  NA 141 140  K 4.9 5.4*  CL 117* 115*  CO2 20* 19*  GLUCOSE 115* 110*  BUN 25* 22*  CREATININE 2.89* 2.46*  CALCIUM 7.7* 8.1*   PT/INR No results for input(s): LABPROT, INR in the last 72 hours. ABG No results for input(s): PHART, HCO3 in the last 72 hours.  Invalid input(s): PCO2, PO2  Studies/Results: US Renal  07/16/2015  CLINICAL DATA:  Acute renal failure. EXAM: RENAL / URINARY TRACT ULTRASOUND COMPLETE COMPARISON:  02/09/2015. FINDINGS: Right Kidney: Length: 10.3 cm. Mildly echogenic. No mass or hydronephrosis visualized. Left Kidney: Length: 9.4 cm. Mildly echogenic. No mass or hydronephrosis visualized. Bladder: Appears normal for degree of bladder distention. IMPRESSION: Mildly echogenic kidneys, compatible with medical renal disease. No hydronephrosis. Electronically Signed   By: Claudie Revering M.D.   On: 07/16/2015 14:09    Anti-infectives: Anti-infectives    Start     Dose/Rate Route Frequency Ordered Stop   07/12/15  0110  cefoTEtan (CEFOTAN) 2 g in dextrose 5 % 50 mL IVPB     2 g 100 mL/hr over 30 Minutes Intravenous On call to O.R. 07/12/15 0110 07/12/15 0750      Assessment/Plan: s/p Procedure(s): COLOSTOMY REVERSAL EXPLORATORY LAPAROTOMY LAPAROSCOPY DIAGNOSTIC LYSIS OF ADHESION   Creatinine is improving. We will recommend changing dressings today and rechecking creatinine tomorrow morning possibly home tomorrow if this should improve.  Florene Glen, MD, FACS  07/18/2015

## 2015-07-18 NOTE — Progress Notes (Signed)
  CONCERNING: IV to Oral Route Change Policy  RECOMMENDATION: This patient is receiving pantoprazole by the intravenous route.  Based on criteria approved by the Pharmacy and Therapeutics Committee, the intravenous medication(s) is/are being converted to the equivalent oral dose form(s).   DESCRIPTION: These criteria include:  The patient is eating (either orally or via tube) and/or has been taking other orally administered medications for a least 24 hours  The patient has no evidence of active gastrointestinal bleeding or impaired GI absorption (gastrectomy, short bowel, patient on TNA or NPO).  If you have questions about this conversion, please contact the Pharmacy Department  []   [phone] )  [facility] [x]   [phone] )  [facility] []   [phone] )  [facility] []   [phone] )  [facility] []   [phone] )  [place], [facility] 07/18/2015 10:00 AM

## 2015-07-18 NOTE — Progress Notes (Signed)
[place] at [place] NAME: [name]    MR#:  [id number]  DATE OF BIRTH:  [birth year]  SUBJECTIVE:   Says she dos not feels good.no chest pain,no abdominal  Pain.  CHIEF COMPLAINT:  No chief complaint on file.   REVIEW OF SYSTEMS:    Review of Systems  Constitutional: Negative for fever and chills.  HENT: Negative for hearing loss.   Eyes: Negative for blurred vision, double vision and photophobia.  Respiratory: Negative for cough, hemoptysis and shortness of breath.   Cardiovascular: Negative for palpitations, orthopnea and leg swelling.  Gastrointestinal: Negative for vomiting, abdominal pain and diarrhea.  Genitourinary: Negative for dysuria and urgency.  Musculoskeletal: Negative for myalgias and neck pain.  Skin: Negative for rash.  Neurological: Negative for dizziness, focal weakness, seizures, weakness and headaches.  Psychiatric/Behavioral: Negative for memory loss. The patient does not have insomnia.     Nutrition: Tolerating Diet: Tolerating PT:      DRUG ALLERGIES:   Allergies  Allergen Reactions  . Buspirone Other (See Comments)    Weakness  . Citalopram Other (See Comments)  . Lisinopril Cough  . Metformin Diarrhea    At 1000 mg dose  . Pravastatin Other (See Comments)    insomnia  . Sitagliptin Other (See Comments)    constipation  . Tramadol Itching  . Diltiazem Hcl Palpitations  . Gabapentin Palpitations  . Hydralazine Rash  . Lovastatin Palpitations    VITALS:  Blood pressure 158/76, pulse 77, temperature 98.7 F (37.1 C), temperature source Oral, resp. rate 18, height 5\' 3"  (1.6 m), weight 92.987 kg (205 lb), SpO2 92 %.  PHYSICAL EXAMINATION:   Physical Exam  GENERAL:  65 y.o.-year-old patient lying in the bed with no acute distress.  EYES: Pupils equal, round, reactive to light and accommodation. No scleral icterus. Extraocular muscles intact.  HEENT: Head atraumatic, normocephalic.  Oropharynx and nasopharynx clear.  NECK:  Supple, no jugular venous distention. No thyroid enlargement, no tenderness.  LUNGS: Normal breath sounds bilaterally, no wheezing, rales,rhonchi or crepitation. No use of accessory muscles of respiration.  CARDIOVASCULAR: S1, S2 normal. No murmurs, rubs, or gallops.  ABDOMEN: Soft, nontender, nondistended. Bowel sounds present. No organomegaly or mass. incision  site is clean.not tender in sutures, EXTREMITIES: No pedal edema, cyanosis, or clubbing.  NEUROLOGIC: Cranial nerves II through XII are intact. Muscle strength 5/5 in all extremities. Sensation intact. Gait not checked.  PSYCHIATRIC: The patient is alert and oriented x 3.  SKIN: No obvious rash, lesion, or ulcer.    LABORATORY PANEL:   CBC  Recent Labs Lab 07/18/15 0445  WBC 11.1*  HGB 8.8*  HCT 28.0*  PLT 277   ------------------------------------------------------------------------------------------------------------------  Chemistries   Recent Labs Lab 07/18/15 0445  NA 140  K 5.4*  CL 115*  CO2 19*  GLUCOSE 110*  BUN 22*  CREATININE 2.46*  CALCIUM 8.1*  MG 1.8   ------------------------------------------------------------------------------------------------------------------  Cardiac Enzymes No results for input(s): TROPONINI in the last 168 hours. ------------------------------------------------------------------------------------------------------------------  RADIOLOGY:  No results found.   ASSESSMENT AND PLAN:   Active Problems:   H/O colostomy   Acute renal failure on chronic renal failure with chronic kidney disease stage III: ATN likely due to hypotension Improving with IV hydration. Renal function improved creatinine down to 2.46.. I renal ultrasound showed medical renal disease. No obstruction., baseline creatinine is 1.6. Added bicarb for metabolic acidosis.    #2 diabetes mellitus type 2: resolved  Hypoglycemia.The  current medical regimen is  effective;  continue  Regular diet. I#3. Essential hypertension:  Blo od pressure improved..Micardis at 40 mg daily . Increase toprol xl to 50 mg daily as bo is running high. #3 hypothyroidism  #4 diabetes mellitus type 2: Hypoglycemia improved. Continue sliding scale with coverage. Change it to regular diet today. Blood sugar is still below 150. Hold the Levemir, oral diabetic medications.  discharge plan depends upon continued improvement in kidney function.  All the records are reviewed and case discussed with Care Management/Social Workerr. Management plans discussed with the patient, family and they are in agreement.  CODE STATUS: full  TOTAL TIME TAKING CARE OF THIS PATIENT: 35  minutes.   POSSIBLE D/C IN  1 -2 DAYS, DEPENDING ON CLINICAL CONDITION.   Epifanio Lesches M.D on 07/18/2015 at 5:29 PM  Between 7am to 6pm - Pager - [phone]  After 6pm go to [email or web address] - password [email or web address] [facility]  [place] [facility]  Office  [phone]  CC: Primary care physician; Adin Hector, MD

## 2015-07-18 NOTE — Progress Notes (Signed)
[name]  ROUNDING NOTE   Subjective:  Creatinine trending down very slowly.  UOP 4301 K 5.4 (4.9) Creatinine 2.46 (2.89)  NS at 100 CO2 19   Objective:  Vital signs in last 24 hours:  Temp:  [98 F (36.7 C)-98.7 F (37.1 C)] 98.7 F (37.1 C) (05/30 1213) Pulse Rate:  [73-93] 77 (05/30 1213) Resp:  [18-22] 18 (05/30 1213) BP: (158-205)/(76-126) 158/76 mmHg (05/30 1213) SpO2:  [91 %-94 %] 92 % (05/30 1213)  Weight change:  Filed Weights   07/12/15 0621  Weight: 92.987 kg (205 lb)    Intake/Output: I/O last 3 completed shifts: In: 4300.7 [P.O.:1080; I.V.:3220.7] Out: 4601 [Urine:4601]   Intake/Output this shift:  Total I/O In: 449 [P.O.:240; I.V.:209] Out: 500 [Urine:500]  Physical Exam: General: NAD, laying in bed  Head: Normocephalic, atraumatic. Moist oral mucosal membranes  Eyes: Anicteric  Neck: Supple, trachea midline  Lungs:  Clear to auscultation, normal effort  Heart: S1S2 no rubs  Abdomen:  Soft, tender near incision.  BS present  Extremities:  no peripheral edema.  Neurologic: Nonfocal, moving all four extremities  Skin: No lesions       Basic Metabolic Panel:  Recent Labs Lab 07/14/15 0459 07/15/15 0446 07/16/15 0740 07/17/15 0449 07/18/15 0445  NA 131* 134* 138 141 140  K 4.9 5.3* 4.7 4.9 5.4*  CL 104 111 113* 117* 115*  CO2 19* 18* 18* 20* 19*  GLUCOSE 117* 141* 116* 115* 110*  BUN 33* 30* 27* 25* 22*  CREATININE 3.50* 3.24* 3.00* 2.89* 2.46*  CALCIUM 6.6* 6.9* 7.5* 7.7* 8.1*  MG  --   --   --   --  1.8    Liver Function Tests: No results for input(s): AST, ALT, ALKPHOS, BILITOT, PROT, ALBUMIN in the last 168 hours. No results for input(s): LIPASE, AMYLASE in the last 168 hours. No results for input(s): AMMONIA in the last 168 hours.  CBC:  Recent Labs Lab 07/12/15 1410 07/13/15 0137 07/14/15 0459 07/18/15 0445  WBC 9.8 12.6* 10.6 11.1*  HGB 11.6* 10.9* 10.0* 8.8*  HCT 37.3 34.5* 32.0* 28.0*  MCV  80.0 78.2* 80.2 78.1*  PLT 216 229 209 277    Cardiac Enzymes: No results for input(s): CKTOTAL, CKMB, CKMBINDEX, TROPONINI in the last 168 hours.  BNP: Invalid input(s): POCBNP  CBG:  Recent Labs Lab 07/17/15 1121 07/17/15 1626 07/17/15 2133 07/18/15 0727 07/18/15 1140  GLUCAP 147* 111* 139* 99 135*    Microbiology: Results for orders placed or performed during the hospital encounter of 07/07/15  Surgical pcr screen     Status: Abnormal   Collection Time: 07/07/15  1:51 PM  Result Value Ref Range Status   MRSA, PCR NEGATIVE NEGATIVE Final   Staphylococcus aureus POSITIVE (A) NEGATIVE Final    Comment:        The Xpert SA Assay (FDA approved for NASAL specimens in patients over 56 years of age), is one component of a comprehensive surveillance program.  Test performance has been validated by [facility] for patients greater than or equal to 91 year old. It is not intended to diagnose infection nor to guide or monitor treatment.     Coagulation Studies: No results for input(s): LABPROT, INR in the last 72 hours.  Urinalysis:  Recent Labs  07/18/15 0819  COLORURINE STRAW*  LABSPEC 1.005  PHURINE 5.0  GLUCOSEU NEGATIVE  HGBUR 1+*  BILIRUBINUR NEGATIVE  KETONESUR NEGATIVE  PROTEINUR 30*  NITRITE NEGATIVE  LEUKOCYTESUR NEGATIVE  Imaging: US Renal  07/16/2015  CLINICAL DATA:  Acute renal failure. EXAM: RENAL / URINARY TRACT ULTRASOUND COMPLETE COMPARISON:  02/09/2015. FINDINGS: Right Kidney: Length: 10.3 cm. Mildly echogenic. No mass or hydronephrosis visualized. Left Kidney: Length: 9.4 cm. Mildly echogenic. No mass or hydronephrosis visualized. Bladder: Appears normal for degree of bladder distention. IMPRESSION: Mildly echogenic kidneys, compatible with medical renal disease. No hydronephrosis. Electronically Signed   By: Claudie Revering M.D.   On: 07/16/2015 14:09     Medications:   . sodium chloride 100 mL/hr at 07/18/15 1151   . atorvastatin   40 mg Oral QHS  . DULoxetine  30 mg Oral Daily  . enoxaparin (LOVENOX) injection  30 mg Subcutaneous Q24H  . insulin aspart  0-15 Units Subcutaneous TID WC  . levothyroxine  88 mcg Oral BH-q7a  . metoprolol succinate  25 mg Oral Daily  . pantoprazole  40 mg Oral QHS   diphenhydrAMINE **OR** diphenhydrAMINE, HYDROcodone-acetaminophen, metoprolol, morphine injection  Assessment/ Plan:  65 y.o. 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   1. Acute renal failure with hyperkalemia on chronic kidney disease stage III. Baseline creatinine ranging from 1.6-2.2 Acute renal failure due to perioperative hypotension. However creatinine seems to fluctuate.  - Continue IV fluids - Start sodium bicarbonate for hyperkalemia and metabolic acidosis.   2. Anemia of chronic kidney disease.  - continue to monitor    LOS: [place], [place] 5/30/20171:35 PM

## 2015-07-19 LAB — CBC WITH DIFFERENTIAL/PLATELET
Basophils Absolute: 0.1 10*3/uL (ref 0–0.1)
Basophils Relative: 1 %
EOS ABS: 0.8 10*3/uL — AB (ref 0–0.7)
HCT: 28.8 % — ABNORMAL LOW (ref 35.0–47.0)
Hemoglobin: 9 g/dL — ABNORMAL LOW (ref 12.0–16.0)
LYMPHS ABS: 1.3 10*3/uL (ref 1.0–3.6)
MCH: 25.3 pg — AB (ref 26.0–34.0)
MCHC: 31.3 g/dL — AB (ref 32.0–36.0)
MCV: 80.7 fL (ref 80.0–100.0)
MONO ABS: 0.9 10*3/uL (ref 0.2–0.9)
Neutro Abs: 7.5 10*3/uL — ABNORMAL HIGH (ref 1.4–6.5)
Neutrophils Relative %: 72 %
PLATELETS: 289 10*3/uL (ref 150–440)
RBC: 3.56 MIL/uL — AB (ref 3.80–5.20)
RDW: 18 % — ABNORMAL HIGH (ref 11.5–14.5)
WBC: 10.4 10*3/uL (ref 3.6–11.0)

## 2015-07-19 LAB — MAGNESIUM: MAGNESIUM: 1.8 mg/dL (ref 1.7–2.4)

## 2015-07-19 LAB — RENAL FUNCTION PANEL
Albumin: 2.7 g/dL — ABNORMAL LOW (ref 3.5–5.0)
Anion gap: 8 (ref 5–15)
BUN: 23 mg/dL — ABNORMAL HIGH (ref 6–20)
CHLORIDE: 113 mmol/L — AB (ref 101–111)
CO2: 19 mmol/L — ABNORMAL LOW (ref 22–32)
Calcium: 8 mg/dL — ABNORMAL LOW (ref 8.9–10.3)
Creatinine, Ser: 2.24 mg/dL — ABNORMAL HIGH (ref 0.44–1.00)
GFR calc non Af Amer: 22 mL/min — ABNORMAL LOW (ref >60)
GFR, EST AFRICAN AMERICAN: 25 mL/min — AB (ref >60)
Glucose, Bld: 125 mg/dL — ABNORMAL HIGH (ref 65–99)
PHOSPHORUS: 3 mg/dL (ref 2.5–4.6)
POTASSIUM: 4.5 mmol/L (ref 3.5–5.1)
Sodium: 140 mmol/L (ref 135–145)

## 2015-07-19 LAB — GLUCOSE, CAPILLARY
GLUCOSE-CAPILLARY: 125 mg/dL — AB (ref 65–99)
GLUCOSE-CAPILLARY: 146 mg/dL — AB (ref 65–99)
GLUCOSE-CAPILLARY: 121 mg/dL — AB (ref 65–99)
Glucose-Capillary: 164 mg/dL — ABNORMAL HIGH (ref 65–99)

## 2015-07-19 LAB — TROPONIN I: TROPONIN I: 0.08 ng/mL — AB (ref <0.031)

## 2015-07-19 LAB — PHOSPHORUS: PHOSPHORUS: 3.6 mg/dL (ref 2.5–4.6)

## 2015-07-19 MED ORDER — METOPROLOL TARTRATE 5 MG/5ML IV SOLN
5.0000 mg | INTRAVENOUS | Status: DC | PRN
  Administered 2015-07-19: 5 mg via INTRAVENOUS

## 2015-07-19 MED ORDER — SODIUM CHLORIDE 0.9 % IV BOLUS (SEPSIS)
500.0000 mL | Freq: Once | INTRAVENOUS | Status: AC
  Administered 2015-07-19: 500 mL via INTRAVENOUS

## 2015-07-19 MED ORDER — AMIODARONE HCL IN DEXTROSE 360-4.14 MG/200ML-% IV SOLN
30.0000 mg/h | INTRAVENOUS | Status: DC
  Administered 2015-07-19 – 2015-07-20 (×3): 30 mg/h via INTRAVENOUS
  Filled 2015-07-19 (×4): qty 200

## 2015-07-19 MED ORDER — METOPROLOL TARTRATE 50 MG PO TABS
50.0000 mg | ORAL_TABLET | Freq: Four times a day (QID) | ORAL | Status: DC
  Administered 2015-07-19: 50 mg via ORAL
  Filled 2015-07-19 (×2): qty 1

## 2015-07-19 MED ORDER — AMIODARONE LOAD VIA INFUSION
150.0000 mg | Freq: Once | INTRAVENOUS | Status: AC
  Administered 2015-07-19: 150 mg via INTRAVENOUS
  Filled 2015-07-19: qty 83.34

## 2015-07-19 MED ORDER — AMIODARONE IV BOLUS ONLY 150 MG/100ML
150.0000 mg | Freq: Once | INTRAVENOUS | Status: DC
  Filled 2015-07-19 (×2): qty 100

## 2015-07-19 MED ORDER — AMIODARONE HCL IN DEXTROSE 360-4.14 MG/200ML-% IV SOLN
60.0000 mg/h | INTRAVENOUS | Status: DC
  Administered 2015-07-19 (×2): 60 mg/h via INTRAVENOUS
  Filled 2015-07-19 (×2): qty 200

## 2015-07-19 MED ORDER — METOPROLOL TARTRATE 5 MG/5ML IV SOLN
10.0000 mg | Freq: Once | INTRAVENOUS | Status: AC
  Administered 2015-07-19: 10 mg via INTRAVENOUS

## 2015-07-19 MED ORDER — AMIODARONE LOAD VIA INFUSION: 150.0000 mg | Freq: Once | INTRAVENOUS | Status: DC

## 2015-07-19 MED ORDER — AMIODARONE HCL 200 MG PO TABS
200.0000 mg | ORAL_TABLET | Freq: Two times a day (BID) | ORAL | Status: DC
Start: 2015-07-19 — End: 2015-07-19

## 2015-07-19 NOTE — Progress Notes (Signed)
Patient diastolic elevated, Dr. Clayborn Bigness notified. Received order to d/c metop 50 ER and start metop tar 50 q6h, and give first dose now. [name]

## 2015-07-19 NOTE — Progress Notes (Signed)
Pt is sustaining a HR at 140's, Doctor Vianne Bulls was notified at [place] 5/31/2017of night shift findings and pt condition at this time. Pt states she is not in any pain, pt is very sweaty. New orders for STAT EKG, troponin I, Mag., Phosphorous, 500 ml bolus, and cardiac consult was placed. RT notified RN of abnormal EKG findings. Lab notified RN of critical lab of troponin 0.08, Doctor Vianne Bulls was notified of findings at [phone] 07/19/2015, no new orders at this, stated she will talked to cardiologist. Will monitor pt closely.   Angus Seller

## 2015-07-19 NOTE — Progress Notes (Signed)
[name]  ROUNDING NOTE   Subjective:   Palpitations - found to have atrial flutter. Patient transferred to 2A.   Creatinine 2.24 (2.46)  Objective:  Vital signs in last 24 hours:  Temp:  [98.3 F (36.8 C)-99 F (37.2 C)] 99 F (37.2 C) (05/31 1045) Pulse Rate:  [75-144] 133 (05/31 1147) Resp:  [18-22] 19 (05/31 1045) BP: (134-166)/(76-119) 137/103 mmHg (05/31 1147) SpO2:  [92 %-95 %] 93 % (05/31 1045)  Weight change:  Filed Weights   07/12/15 0621  Weight: 92.987 kg (205 lb)    Intake/Output: I/O last 3 completed shifts: In: 3898 [P.O.:720; I.V.:3178] Out: 4500 [Urine:4500]   Intake/Output this shift:  Total I/O In: 1111 [I.V.:1111] Out: -   Physical Exam: General: NAD, laying in bed  Head: Normocephalic, atraumatic. Moist oral mucosal membranes  Eyes: Anicteric  Neck: Supple, trachea midline  Lungs:  Clear to auscultation, normal effort  Heart: S1S2 no rubs  Abdomen:  Soft, tender near incision.  BS present  Extremities:  no peripheral edema.  Neurologic: Nonfocal, moving all four extremities  Skin: No lesions       Basic Metabolic Panel:  Recent Labs Lab 07/15/15 0446 07/16/15 0740 07/17/15 0449 07/18/15 0445 07/19/15 0513 07/19/15 0743  NA 134* 138 141 140 140  --   K 5.3* 4.7 4.9 5.4* 4.5  --   CL 111 113* 117* 115* 113*  --   CO2 18* 18* 20* 19* 19*  --   GLUCOSE 141* 116* 115* 110* 125*  --   BUN 30* 27* 25* 22* 23*  --   CREATININE 3.24* 3.00* 2.89* 2.46* 2.24*  --   CALCIUM 6.9* 7.5* 7.7* 8.1* 8.0*  --   MG  --   --   --  1.8  --  1.8  PHOS  --   --   --   --  3.0 3.6    Liver Function Tests:  Recent Labs Lab 07/19/15 0513  ALBUMIN 2.7*   No results for input(s): LIPASE, AMYLASE in the last 168 hours. No results for input(s): AMMONIA in the last 168 hours.  CBC:  Recent Labs Lab 07/12/15 1410 07/13/15 0137 07/14/15 0459 07/18/15 0445 07/19/15 0513  WBC 9.8 12.6* 10.6 11.1* 10.4  NEUTROABS  --   --    --   --  7.5*  HGB 11.6* 10.9* 10.0* 8.8* 9.0*  HCT 37.3 34.5* 32.0* 28.0* 28.8*  MCV 80.0 78.2* 80.2 78.1* 80.7  PLT 216 229 209 277 289    Cardiac Enzymes:  Recent Labs Lab 07/19/15 0743  TROPONINI 0.08*    BNP: Invalid input(s): POCBNP  CBG:  Recent Labs Lab 07/18/15 1140 07/18/15 1647 07/18/15 2142 07/19/15 0755 07/19/15 1133  GLUCAP 135* 99 101* 125* 164*    Microbiology: Results for orders placed or performed during the hospital encounter of 07/07/15  Surgical pcr screen     Status: Abnormal   Collection Time: 07/07/15  1:51 PM  Result Value Ref Range Status   MRSA, PCR NEGATIVE NEGATIVE Final   Staphylococcus aureus POSITIVE (A) NEGATIVE Final    Comment:        The Xpert SA Assay (FDA approved for NASAL specimens in patients over 40 years of age), is one component of a comprehensive surveillance program.  Test performance has been validated by [facility] for patients greater than or equal to 75 year old. It is not intended to diagnose infection nor to guide or monitor treatment.  Coagulation Studies: No results for input(s): LABPROT, INR in the last 72 hours.  Urinalysis:  Recent Labs  07/18/15 0819  COLORURINE STRAW*  LABSPEC 1.005  PHURINE 5.0  GLUCOSEU NEGATIVE  HGBUR 1+*  BILIRUBINUR NEGATIVE  KETONESUR NEGATIVE  PROTEINUR 30*  NITRITE NEGATIVE  LEUKOCYTESUR NEGATIVE      Imaging: No results found.   Medications:   . sodium chloride 75 mL/hr at 07/19/15 0054  . amiodarone 60 mg/hr (07/19/15 1146)   Followed by  . amiodarone     . atorvastatin  40 mg Oral QHS  . DULoxetine  30 mg Oral Daily  . insulin aspart  0-15 Units Subcutaneous TID WC  . levothyroxine  88 mcg Oral BH-q7a  . metoprolol succinate  50 mg Oral Daily  . pantoprazole  40 mg Oral QHS  . sodium bicarbonate  650 mg Oral BID   diphenhydrAMINE **OR** diphenhydrAMINE, HYDROcodone-acetaminophen, metoprolol, morphine injection  Assessment/ Plan:  65  y.o. 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   1. Acute renal failure with hyperkalemia on chronic kidney disease stage III. Baseline creatinine ranging from 1.6-2.2. Now close to baseline.  Acute renal failure due to perioperative hypotension. However creatinine seems to fluctuate.  - Continue IV fluids for now.  - sodium bicarbonate for hyperkalemia and metabolic acidosis.   2. Anemia of chronic kidney disease.  - continue to monitor   3. Hypertension with atrial fibrillation:  - amiodarone and metoprolol.    LOS: 7 [name] 5/31/201711:54 AM

## 2015-07-19 NOTE — Progress Notes (Signed)
7 Days Post-Op  Subjective: Patient states her heart is "fluttering". She has been in a flutter this morning and is being transferred to telemetry. She denies any abdominal pain nausea vomiting is tolerating a diet.  Objective: Vital signs in last 24 hours: Temp:  [98.3 F (36.8 C)-99 F (37.2 C)] 99 F (37.2 C) (05/31 1045) Pulse Rate:  [75-142] 142 (05/31 1045) Resp:  [18-22] 19 (05/31 1045) BP: (134-166)/(76-119) 158/119 mmHg (05/31 1045) SpO2:  [92 %-95 %] 93 % (05/31 1045) Last BM Date: 07/18/15  Intake/Output from previous day: 05/30 0701 - 05/31 0700 In: 1679 [P.O.:480; I.V.:1199] Out: 1300 [Urine:1300] Intake/Output this shift: Total I/O In: 1111 [I.V.:1111] Out: -   Physical exam:  Morbidly obese female patient in no acute distress her heart rate appears to be in atrial flutter or fibrillation but her heart rate appears to be around 100 by my examination at this point in time. Abdomen is soft nontender wounds are clean Penrose is present in the left lateral incision. Calves are nontender  Lab Results: CBC   Recent Labs  07/18/15 0445 07/19/15 0513  WBC 11.1* 10.4  HGB 8.8* 9.0*  HCT 28.0* 28.8*  PLT 277 289   BMET  Recent Labs  07/18/15 0445 07/19/15 0513  NA 140 140  K 5.4* 4.5  CL 115* 113*  CO2 19* 19*  GLUCOSE 110* 125*  BUN 22* 23*  CREATININE 2.46* 2.24*  CALCIUM 8.1* 8.0*   PT/INR No results for input(s): LABPROT, INR in the last 72 hours. ABG No results for input(s): PHART, HCO3 in the last 72 hours.  Invalid input(s): PCO2, PO2  Studies/Results: No results found.  Anti-infectives: Anti-infectives    Start     Dose/Rate Route Frequency Ordered Stop   07/12/15 0110  cefoTEtan (CEFOTAN) 2 g in dextrose 5 % 50 mL IVPB     2 g 100 mL/hr over 30 Minutes Intravenous On call to O.R. 07/12/15 0110 07/12/15 0750      Assessment/Plan: s/p Procedure(s): COLOSTOMY REVERSAL EXPLORATORY LAPAROTOMY LAPAROSCOPY DIAGNOSTIC LYSIS OF  ADHESION   Agree with transfer to telemetry. At this point she is stable from a surgical standpoint but requires further workup for her atrial fibrillation flutter  Florene Glen, MD, FACS  07/19/2015

## 2015-07-19 NOTE — Progress Notes (Signed)
Report given to [name], RN on 2A.   [name]

## 2015-07-19 NOTE — Progress Notes (Signed)
Spoke with Dr.Pyreddy about patient's history of reaction to cardizem. Order changed to 10 mg of IV metoprolol once for elevated heart rate. [name]

## 2015-07-19 NOTE — Progress Notes (Signed)
Spoke with Dr.Pyreddy about patient's heart rate and rhythm of Aflutter with RVR. New order given to give Cardizem push once. [name]

## 2015-07-19 NOTE — Progress Notes (Signed)
Patient dressing changed on abd, minimal drainage. No odor, redness, or inflammation. Patient c/o 6/10, medicated for pain. [name]

## 2015-07-19 NOTE — Progress Notes (Signed)
Spoke with Dr.Pyreddy about elevated heartrate in the 140's. New order for metoprolol 5mg  every 4 hrs as needed for heart rate greater than 110. Also order to put patient on off unit telemetry. [name]

## 2015-07-19 NOTE — Progress Notes (Signed)
Patient transferred from [name], 2nd IV started, and amio started. Patient currently stable. HR has decreased to 130's (140's on arrival). VSS. Patient is still diaphoretic, but improving. No complaints will cont to assess.

## 2015-07-19 NOTE — Care Management (Signed)
Patient with heart rate up to 140 during the night with order for prn metoprolol.  Developed atrial flutter and received dose  of iv metoprolol.  Informed during progression that patient to transfer to 2A for closer monitoring of arrhythmia.

## 2015-07-19 NOTE — Progress Notes (Signed)
[place] at [place] NAME: [name]    MR#:  [id number]  DATE OF BIRTH:  [birth year]  SUBJECTIVE:  HR 140 with atrial flutter.pt  Is sweaty.no  Dizziness.no chest pain.  CHIEF COMPLAINT:  No chief complaint on file.   REVIEW OF SYSTEMS:    Review of Systems  Constitutional: Negative for fever and chills.  HENT: Negative for hearing loss.   Eyes: Negative for blurred vision, double vision and photophobia.  Respiratory: Negative for cough, hemoptysis and shortness of breath.   Cardiovascular: Negative for palpitations, orthopnea and leg swelling.  Gastrointestinal: Negative for vomiting, abdominal pain and diarrhea.  Genitourinary: Negative for dysuria and urgency.  Musculoskeletal: Negative for myalgias and neck pain.  Skin: Negative for rash.  Neurological: Negative for dizziness, focal weakness, seizures, weakness and headaches.  Psychiatric/Behavioral: Negative for memory loss. The patient does not have insomnia.     Nutrition: Tolerating Diet: Tolerating PT:      DRUG ALLERGIES:   Allergies  Allergen Reactions  . Buspirone Other (See Comments)    Weakness  . Citalopram Other (See Comments)  . Lisinopril Cough  . Metformin Diarrhea    At 1000 mg dose  . Pravastatin Other (See Comments)    insomnia  . Sitagliptin Other (See Comments)    constipation  . Tramadol Itching  . Diltiazem Hcl Palpitations  . Gabapentin Palpitations  . Hydralazine Rash  . Lovastatin Palpitations    VITALS:  Blood pressure 134/86, pulse 140, temperature 98.3 F (36.8 C), temperature source Oral, resp. rate 22, height 5\' 3"  (1.6 m), weight 92.987 kg (205 lb), SpO2 95 %.  PHYSICAL EXAMINATION:   Physical Exam  GENERAL:  65 y.o.-year-old patient lying in the bed with no acute distress.  EYES: Pupils equal, round, reactive to light and accommodation. No scleral icterus. Extraocular muscles intact.  HEENT: Head atraumatic,  normocephalic. Oropharynx and nasopharynx clear.  NECK:  Supple, no jugular venous distention. No thyroid enlargement, no tenderness.  LUNGS: Normal breath sounds bilaterally, no wheezing, rales,rhonchi or crepitation. No use of accessory muscles of respiration.  CARDIOVASCULAR: S1, S2 normal. No murmurs, rubs, or gallops.  ABDOMEN: Soft, nontender, nondistended. Bowel sounds present. No organomegaly or mass. incision  site is clean.not tender in sutures, EXTREMITIES: No pedal edema, cyanosis, or clubbing.  NEUROLOGIC: Cranial nerves II through XII are intact. Muscle strength 5/5 in all extremities. Sensation intact. Gait not checked.  PSYCHIATRIC: The patient is alert and oriented x 3.  SKIN: No obvious rash, lesion, or ulcer.    LABORATORY PANEL:   CBC  Recent Labs Lab 07/19/15 0513  WBC 10.4  HGB 9.0*  HCT 28.8*  PLT 289   ------------------------------------------------------------------------------------------------------------------  Chemistries   Recent Labs Lab 07/19/15 0513 07/19/15 0743  NA 140  --   K 4.5  --   CL 113*  --   CO2 19*  --   GLUCOSE 125*  --   BUN 23*  --   CREATININE 2.24*  --   CALCIUM 8.0*  --   MG  --  1.8   ------------------------------------------------------------------------------------------------------------------  Cardiac Enzymes  Recent Labs Lab 07/19/15 0743  TROPONINI 0.08*   ------------------------------------------------------------------------------------------------------------------  RADIOLOGY:  No results found.   ASSESSMENT AND PLAN:   Active Problems:   H/O colostomy Atrial flutter;Discussed with Dr. Clayborn Bigness he recommended amiodarone bolus, starting by mouth amiodarone  After  the bolus. Patient will be transferred to telemetry. If the heart rate is still  high on the amiodarone and recommended digoxin also.will continue toprol xl 50 mg daily and titrate further. The elevated troponins likely secondary to  renal failure and also due to  atrial flutter.  Acute renal failure on chronic renal failure with chronic kidney disease stage III: ATN likely due to hypotension Improving with IV hydration. Renal function improving.. I renal ultrasound showed medical renal disease. No obstruction., baseline creatinine is 1.6. Added bicarb for metabolic acidosis.    #2 diabetes mellitus type 2: resolved  Hypoglycemia.The current medical regimen is effective;  continue  Regular diet. I#3. Essential hypertension; and atrial flutter today morning. #3 hypothyroidism  #4 diabetes mellitus type 2: Hypoglycemia improved. Continue sliding scale with coverage. Change it to regular diet today. Blood sugar is still below 150. Hold the Levemir, oral diabetic medications.  discharge plan depends upon continued improvement in kidney function.  All the records are reviewed and case discussed with Care Management/Social Workerr. Management plans discussed with the patient, family and they are in agreement.  CODE STATUS: full  TOTAL TIME TAKING CARE OF THIS PATIENT: 35  minutes. (CCT)  POSSIBLE D/C IN  3-4 DAYS, DEPENDING ON CLINICAL CONDITION.   Epifanio Lesches M.D on 07/19/2015 at 8:59 AM  Between 7am to 6pm - Pager - [phone]  After 6pm go to [email or web address] - password [email or web address] [facility]  [facility]  Office  [phone]  CC: Primary care physician; Adin Hector, MD

## 2015-07-20 LAB — RENAL FUNCTION PANEL
ANION GAP: 9 (ref 5–15)
Albumin: 2.8 g/dL — ABNORMAL LOW (ref 3.5–5.0)
BUN: 30 mg/dL — ABNORMAL HIGH (ref 6–20)
CHLORIDE: 110 mmol/L (ref 101–111)
CO2: 19 mmol/L — ABNORMAL LOW (ref 22–32)
CREATININE: 2.44 mg/dL — AB (ref 0.44–1.00)
Calcium: 7.8 mg/dL — ABNORMAL LOW (ref 8.9–10.3)
GFR, EST AFRICAN AMERICAN: 23 mL/min — AB (ref >60)
GFR, EST NON AFRICAN AMERICAN: 20 mL/min — AB (ref >60)
Glucose, Bld: 143 mg/dL — ABNORMAL HIGH (ref 65–99)
POTASSIUM: 4.9 mmol/L (ref 3.5–5.1)
Phosphorus: 4.4 mg/dL (ref 2.5–4.6)
Sodium: 138 mmol/L (ref 135–145)

## 2015-07-20 LAB — GLUCOSE, CAPILLARY
GLUCOSE-CAPILLARY: 112 mg/dL — AB (ref 65–99)
GLUCOSE-CAPILLARY: 115 mg/dL — AB (ref 65–99)
GLUCOSE-CAPILLARY: 103 mg/dL — AB (ref 65–99)
GLUCOSE-CAPILLARY: 130 mg/dL — AB (ref 65–99)

## 2015-07-20 MED ORDER — CLONIDINE HCL 0.1 MG PO TABS
0.1000 mg | ORAL_TABLET | Freq: Once | ORAL | Status: AC
  Administered 2015-07-20: 0.1 mg via ORAL
  Filled 2015-07-20: qty 1

## 2015-07-20 MED ORDER — HEPARIN SODIUM (PORCINE) 5000 UNIT/ML IJ SOLN
5000.0000 [IU] | Freq: Three times a day (TID) | INTRAMUSCULAR | Status: DC
  Administered 2015-07-20 – 2015-07-22 (×6): 5000 [IU] via SUBCUTANEOUS
  Filled 2015-07-20 (×6): qty 1

## 2015-07-20 MED ORDER — AMIODARONE HCL 200 MG PO TABS
100.0000 mg | ORAL_TABLET | Freq: Two times a day (BID) | ORAL | Status: DC
  Administered 2015-07-20 – 2015-07-22 (×5): 100 mg via ORAL
  Filled 2015-07-20 (×5): qty 1

## 2015-07-20 MED ORDER — METOPROLOL TARTRATE 25 MG PO TABS
25.0000 mg | ORAL_TABLET | Freq: Two times a day (BID) | ORAL | Status: DC
  Administered 2015-07-20 – 2015-07-22 (×4): 25 mg via ORAL
  Filled 2015-07-20 (×4): qty 1

## 2015-07-20 NOTE — Progress Notes (Signed)
Per Dr. Vianne Bulls d/c amio drip, start amio 100 bid, d/c metop 50, and start metop 25 BID. Orders placed. Patient still in afib, but HR now is 60's. Wilnette Kales

## 2015-07-20 NOTE — Progress Notes (Signed)
Dressing on abd changed. Clean, dry, and intact. No signs of infections, penrose drain removed by Dr. [name] earlier today. Patient medicated prior to dressing change and does not request anymore pain medication at this time. Patient noted to be NSR around 1500 today. Patient drip d/c'd earlier today, now on PO amio. [name]

## 2015-07-20 NOTE — Progress Notes (Signed)
[place] [facility] [place] NAME: Tamyrah Blancett    MR#:  [id number]  DATE OF BIRTH:  February 16, 1951  SUBJECTIVE:  Patient feels much better today heart rate is in 60s. No chest pain.   CHIEF COMPLAINT:  No chief complaint on file.   REVIEW OF SYSTEMS:    Review of Systems  Constitutional: Negative for fever and chills.  HENT: Negative for hearing loss.   Eyes: Negative for blurred vision, double vision and photophobia.  Respiratory: Negative for cough, hemoptysis and shortness of breath.   Cardiovascular: Negative for palpitations, orthopnea and leg swelling.  Gastrointestinal: Negative for vomiting, abdominal pain and diarrhea.  Genitourinary: Negative for dysuria and urgency.  Musculoskeletal: Negative for myalgias and neck pain.  Skin: Negative for rash.  Neurological: Negative for dizziness, focal weakness, seizures, weakness and headaches.  Psychiatric/Behavioral: Negative for memory loss. The patient does not have insomnia.     Nutrition: Tolerating Diet: Tolerating PT:      DRUG ALLERGIES:   Allergies  Allergen Reactions  . Buspirone Other (See Comments)    Weakness  . Citalopram Other (See Comments)  . Lisinopril Cough  . Metformin Diarrhea    At 1000 mg dose  . Pravastatin Other (See Comments)    insomnia  . Sitagliptin Other (See Comments)    constipation  . Tramadol Itching  . Diltiazem Hcl Palpitations  . Gabapentin Palpitations  . Hydralazine Rash  . Lovastatin Palpitations    VITALS:  Blood pressure 162/90, pulse 63, temperature 97.4 F (36.3 C), temperature source Oral, resp. rate 20, height 5\' 3"  (1.6 m), weight 92.987 kg (205 lb), SpO2 99 %.  PHYSICAL EXAMINATION:   Physical Exam  GENERAL:  65 y.o.-year-old patient lying in the bed with no acute distress.  EYES: Pupils equal, round, reactive to light and accommodation. No scleral icterus. Extraocular muscles intact.  HEENT: Head atraumatic,  normocephalic. Oropharynx and nasopharynx clear.  NECK:  Supple, no jugular venous distention. No thyroid enlargement, no tenderness.  LUNGS: Normal breath sounds bilaterally, no wheezing, rales,rhonchi or crepitation. No use of accessory muscles of respiration.  CARDIOVASCULAR: S1, S2 normal. No murmurs, rubs, or gallops.  ABDOMEN: Soft, nontender, nondistended. Bowel sounds present. No organomegaly or mass. incision  site is clean.not tender in sutures, EXTREMITIES: No pedal edema, cyanosis, or clubbing.  NEUROLOGIC: Cranial nerves II through XII are intact. Muscle strength 5/5 in all extremities. Sensation intact. Gait not checked.  PSYCHIATRIC: The patient is alert and oriented x 3.  SKIN: No obvious rash, lesion, or ulcer.    LABORATORY PANEL:   CBC  Recent Labs Lab 07/19/15 0513  WBC 10.4  HGB 9.0*  HCT 28.8*  PLT 289   ------------------------------------------------------------------------------------------------------------------  Chemistries   Recent Labs Lab 07/19/15 0513 07/19/15 0743  NA 140  --   K 4.5  --   CL 113*  --   CO2 19*  --   GLUCOSE 125*  --   BUN 23*  --   CREATININE 2.24*  --   CALCIUM 8.0*  --   MG  --  1.8   ------------------------------------------------------------------------------------------------------------------  Cardiac Enzymes  Recent Labs Lab 07/19/15 0743  TROPONINI 0.08*   ------------------------------------------------------------------------------------------------------------------  RADIOLOGY:  No results found.   ASSESSMENT AND PLAN:   Active Problems:   H/O colostomy Atrial flutter; status post amiodarone drip. Heart rate in 60s.d/c amiodarone drip.  Started  on metoprolol tartrate 50 mg 3 times a day by cardiology. But concern  about bradycardia with a higher dose. Decrease to metoprolol tartrate 25 mg twice a day, start amiodarone 100 MG by mouth twice a day. Appreciate cardiology input.  Acute renal  failure on chronic renal failure with chronic kidney disease stage III: ATN likely due to hypotension Improving with IV hydration. Renal function improving.Kenna Gilbert ultrasound showed medical renal disease. No obstruction., baseline creatinine is 1.6. Function is improving slowly. Added bicarb for metabolic acidosis.    #2 diabetes mellitus type 2: resolved  Hypoglycemia.T I#3. Essential hypertension;monitor . #3 hypothyroidism  #4 diabetes mellitus type 2: Hypoglycemia improved. Continue sliding scale with coverage. Change it to regular diet today. Blood sugar is still below 150. Hold the Levemir, oral diabetic medications.   discharge plan depends upon continued improvement in kidney function. And also stabilization of atrial fibrillation.  She feels much better than yesterday. Continue monitoring on telemetry, appreciate cardiology adjusting the medications for atrial flutter. All the records are reviewed and case discussed with Care Management/Social Workerr. Management plans discussed with the patient, family and they are in agreement.  CODE STATUS: full  TOTAL TIME TAKING CARE OF THIS PATIENT: 35  minutes. (CCT)  POSSIBLE D/C IN  3-4 DAYS, DEPENDING ON CLINICAL CONDITION.   Epifanio Lesches M.D on 07/20/2015 at 10:05 AM  Between 7am to 6pm - Pager - [phone]  After 6pm go to [email or web address] - password [email or web address] [facility]  [place] [facility]  Office  [phone]  CC: Primary care physician; Adin Hector, MD

## 2015-07-20 NOTE — Progress Notes (Signed)
[name]  ROUNDING NOTE   Subjective:   Creatinine 2.44 (2.24)  NS at 75  Abdominal drain removed.  A- flutter now rate controlled.   Objective:  Vital signs in last 24 hours:  Temp:  [97.4 F (36.3 C)-98.5 F (36.9 C)] 98.2 F (36.8 C) (06/01 1139) Pulse Rate:  [58-131] 58 (06/01 1139) Resp:  [18-20] 18 (06/01 1139) BP: (142-165)/(90-127) 153/90 mmHg (06/01 1121) SpO2:  [95 %-99 %] 96 % (06/01 1139)  Weight change:  Filed Weights   07/12/15 0621  Weight: 92.987 kg (205 lb)    Intake/Output: I/O last 3 completed shifts: In: 2502.3 [P.O.:240; I.V.:2262.3] Out: 800 [Urine:800]   Intake/Output this shift:  Total I/O In: 240 [P.O.:240] Out: -   Physical Exam: General: NAD, laying in bed  Head: Normocephalic, atraumatic. Moist oral mucosal membranes  Eyes: Anicteric  Neck: Supple, trachea midline  Lungs:  Clear to auscultation, normal effort  Heart: S1S2 no rubs  Abdomen:  Soft, tender near incision.  BS present  Extremities:  no peripheral edema.  Neurologic: Nonfocal, moving all four extremities  Skin: No lesions       Basic Metabolic Panel:  Recent Labs Lab 07/16/15 0740 07/17/15 0449 07/18/15 0445 07/19/15 0513 07/19/15 0743 07/20/15 1057  NA 138 141 140 140  --  138  K 4.7 4.9 5.4* 4.5  --  4.9  CL 113* 117* 115* 113*  --  110  CO2 18* 20* 19* 19*  --  19*  GLUCOSE 116* 115* 110* 125*  --  143*  BUN 27* 25* 22* 23*  --  30*  CREATININE 3.00* 2.89* 2.46* 2.24*  --  2.44*  CALCIUM 7.5* 7.7* 8.1* 8.0*  --  7.8*  MG  --   --  1.8  --  1.8  --   PHOS  --   --   --  3.0 3.6 4.4    Liver Function Tests:  Recent Labs Lab 07/19/15 0513 07/20/15 1057  ALBUMIN 2.7* 2.8*   No results for input(s): LIPASE, AMYLASE in the last 168 hours. No results for input(s): AMMONIA in the last 168 hours.  CBC:  Recent Labs Lab 07/14/15 0459 07/18/15 0445 07/19/15 0513  WBC 10.6 11.1* 10.4  NEUTROABS  --   --  7.5*  HGB 10.0* 8.8*  9.0*  HCT 32.0* 28.0* 28.8*  MCV 80.2 78.1* 80.7  PLT 209 277 289    Cardiac Enzymes:  Recent Labs Lab 07/19/15 0743  TROPONINI 0.08*    BNP: Invalid input(s): POCBNP  CBG:  Recent Labs Lab 07/19/15 1133 07/19/15 1643 07/19/15 2119 07/20/15 0733 07/20/15 1141  GLUCAP 164* 146* 121* 112* 115*    Microbiology: Results for orders placed or performed during the hospital encounter of 07/07/15  Surgical pcr screen     Status: Abnormal   Collection Time: 07/07/15  1:51 PM  Result Value Ref Range Status   MRSA, PCR NEGATIVE NEGATIVE Final   Staphylococcus aureus POSITIVE (A) NEGATIVE Final    Comment:        The Xpert SA Assay (FDA approved for NASAL specimens in patients over 40 years of age), is one component of a comprehensive surveillance program.  Test performance has been validated by [facility] for patients greater than or equal to 99 year old. It is not intended to diagnose infection nor to guide or monitor treatment.     Coagulation Studies: No results for input(s): LABPROT, INR in the last 72 hours.  Urinalysis:  Recent Labs  07/18/15 0819  COLORURINE STRAW*  LABSPEC 1.005  PHURINE 5.0  GLUCOSEU NEGATIVE  HGBUR 1+*  BILIRUBINUR NEGATIVE  KETONESUR NEGATIVE  PROTEINUR 30*  NITRITE NEGATIVE  LEUKOCYTESUR NEGATIVE      Imaging: No results found.   Medications:     . amiodarone  100 mg Oral BID  . atorvastatin  40 mg Oral QHS  . DULoxetine  30 mg Oral Daily  . insulin aspart  0-15 Units Subcutaneous TID WC  . levothyroxine  88 mcg Oral BH-q7a  . metoprolol tartrate  25 mg Oral BID  . pantoprazole  40 mg Oral QHS  . sodium bicarbonate  650 mg Oral BID   diphenhydrAMINE **OR** diphenhydrAMINE, HYDROcodone-acetaminophen, metoprolol, morphine injection  Assessment/ Plan:  65 y.o. female with a PMHX of diabetes which is long-standing, GI bleed, aortic stenosis, hypertension, diverticulitis and perforated bowel in 2014 requiring  colostomy diabetic neuropathy, morbid obesity, chronic kidney disease baseline Cr 1.6.   1. Acute renal failure with hyperkalemia on chronic kidney disease stage III. Baseline creatinine ranging from 1.6-2.2. Now close to baseline.  Acute renal failure due to perioperative hypotension. However creatinine seems to fluctuate.  - Discontinue IV fluids  - sodium bicarbonate for hyperkalemia and metabolic acidosis.   2. Anemia of chronic kidney disease.  - continue to monitor   3. Hypertension with atrial fibrillation:  - amiodarone and metoprolol.    LOS: [place], [place] 6/1/201711:55 AM

## 2015-07-20 NOTE — Progress Notes (Signed)
Patient remained hemodynamically stable overnight. She maintained NSR in the 60s with stable VS. Patient abdominal dressing remained clean, dry and intact. Patient needed items were placed within patient's reach. Will continue to monitor.

## 2015-07-20 NOTE — Progress Notes (Signed)
8 Days Post-Op  Subjective: Patient feels better today she has no fluttering in her chest and she is on an amiodarone drip. In general she feels well and is tolerating a diet  Objective: Vital signs in last 24 hours: Temp:  [97.4 F (36.3 C)-99 F (37.2 C)] 97.4 F (36.3 C) (06/01 0436) Pulse Rate:  [62-144] 63 (06/01 0436) Resp:  [19-20] 20 (06/01 0436) BP: (137-165)/(90-127) 162/90 mmHg (06/01 0436) SpO2:  [93 %-99 %] 99 % (06/01 0436) Last BM Date: 07/19/15  Intake/Output from previous day: 05/31 0701 - 06/01 0700 In: 1997.3 [P.O.:240; I.V.:1757.3] Out: -  Intake/Output this shift:    Physical exam:  Heart rate better controlled abdomen is soft nondistended nontympanitic and nontender wounds are clean. We will discontinue Penrose drain today  Lab Results: CBC   Recent Labs  07/18/15 0445 07/19/15 0513  WBC 11.1* 10.4  HGB 8.8* 9.0*  HCT 28.0* 28.8*  PLT 277 289   BMET  Recent Labs  07/18/15 0445 07/19/15 0513  NA 140 140  K 5.4* 4.5  CL 115* 113*  CO2 19* 19*  GLUCOSE 110* 125*  BUN 22* 23*  CREATININE 2.46* 2.24*  CALCIUM 8.1* 8.0*   PT/INR No results for input(s): LABPROT, INR in the last 72 hours. ABG No results for input(s): PHART, HCO3 in the last 72 hours.  Invalid input(s): PCO2, PO2  Studies/Results: No results found.  Anti-infectives: Anti-infectives    Start     Dose/Rate Route Frequency Ordered Stop   07/12/15 0110  cefoTEtan (CEFOTAN) 2 g in dextrose 5 % 50 mL IVPB     2 g 100 mL/hr over 30 Minutes Intravenous On call to O.R. 07/12/15 0110 07/12/15 0750      Assessment/Plan: s/p Procedure(s): COLOSTOMY REVERSAL EXPLORATORY LAPAROTOMY LAPAROSCOPY DIAGNOSTIC LYSIS OF ADHESION   On amiodarone drip plans per internal medicine. We will discontinue Penrose drain today anticipate discharge as soon as her cardiac condition improves.  Florene Glen, MD, FACS  07/20/2015

## 2015-07-20 NOTE — Consult Note (Signed)
Reason for Consult: Atrial flutter tachycardia Referring Physician: Dr. Konidena, Cardiologist's [facility]  [name] is an 65 y.o. female.  HPI: Patient presents with significant tachycardia heart rates of 130  patient also has some slight irregularity suggestive of A. fib. History of obesity she's been postop from colostomy reversal initially patient had diverticulitis requiring a colostomy. Patient had significant and diffuse abdominal discomfort and was readmitted. Patient was found to have tachycardia so cardiology was recommended for further management. Patient is known history of atrial fibrillation in the past and is not on anticoagulation because of GI bleeding  Past Medical History  Diagnosis Date  . Diabetes mellitus without complication (HCC)   . GI bleed   . Aortic valve stenosis   . Hyperlipidemia   . Hypertension   . Diverticulitis large intestine   . Obesity   . Shortness of breath dyspnea   . Anxiety   . Neuropathy in diabetes (HCC)   . Anemia   . Vertigo   . Bowel obstruction (HCC)   . Hypothyroidism   . Arthritis     feet, legs  . Colostomy in place (HCC)   . MRSA (methicillin resistant Staphylococcus aureus)     at abdominal wound.  Jan 2017.  Treated.   . Heart murmur   . CKD (chronic kidney disease)     protein in urine  . Ectopic atrial tachycardia (HCC)   . Dysrhythmia   . Neuropathy (HCC)   . Gastritis     Past Surgical History  Procedure Laterality Date  . Colectomy    . Laparotomy closure of cecal perforation  05/09/2013    Dr. Bird  . Tonsillectomy    . Back surgery      spur frmoved from lower back  . Cardiac catheterization    . Abdominal hysterectomy    . Laparotomy N/A 02/06/2015    Procedure: Laparotomy, reduction of incarcerated parastomal hernia, repair of parastomal hernia with mesh;  Surgeon: Mark Bird, MD;  Location: [facility];  Service: General;  Laterality: N/A;  . Debridement of abdominal wall abscess N/A 02/22/2015   Procedure: DEBRIDEMENT OF ABDOMINAL WALL ABSCESS;  Surgeon: Charles Woodham, MD;  Location: [facility];  Service: General;  Laterality: N/A;  . Excision mass abdominal N/A 02/24/2015    Procedure: EXCISION MASS ABDOMINAL  / WASH OUT;  Surgeon: Charles Woodham, MD;  Location: [facility];  Service: General;  Laterality: N/A;  . Application of wound vac N/A 02/24/2015    Procedure: APPLICATION OF WOUND VAC;  Surgeon: Charles Woodham, MD;  Location: [facility];  Service: General;  Laterality: N/A;  . Excision mass abdominal N/A 02/26/2015    Procedure: EXCISION MASS ABDOMINAL/wash out;  Surgeon: Charles Woodham, MD;  Location: [facility];  Service: General;  Laterality: N/A;  . Application of wound vac N/A 02/26/2015    Procedure: APPLICATION OF WOUND VAC;  Surgeon: Charles Woodham, MD;  Location: [facility];  Service: General;  Laterality: N/A;  . Wound debridement N/A 03/03/2015    Procedure: DEBRIDEMENT ABDOMINAL WOUND;  Surgeon: Richard E Cooper, MD;  Location: [facility];  Service: General;  Laterality: N/A;  . Wound debridement N/A 03/09/2015    Procedure: DEBRIDEMENT ABDOMINAL WOUND;  Surgeon: Richard E Cooper, MD;  Location: [facility];  Service: General;  Laterality: N/A;  . Flexible sigmoidoscopy  06/19/2015    Procedure: FLEXIBLE SIGMOIDOSCOPY;  Surgeon: Darren Wohl, MD;  Location: [facility];  Service: Endoscopy;;  UNABLE TO ACCESS OSTOMY SITE FOR ACCESS INTO   COLON  . Colostomy reversal N/A 07/12/2015    Procedure: COLOSTOMY REVERSAL;  Surgeon: Charles Woodham, MD;  Location: [facility];  Service: General;  Laterality: N/A;  . Laparotomy  07/12/2015    Procedure: EXPLORATORY LAPAROTOMY;  Surgeon: Charles Woodham, MD;  Location: [facility];  Service: General;;  . Laparoscopy  07/12/2015    Procedure: LAPAROSCOPY DIAGNOSTIC;  Surgeon: Charles Woodham, MD;  Location: [facility];  Service: General;;  . Lysis of adhesion  07/12/2015    Procedure: LYSIS OF ADHESION;  Surgeon: Charles Woodham, MD;  Location: [facility];   Service: General;;    Family History  Problem Relation Age of Onset  . Diabetes Mother   . Hypertension Father     Social History:  reports that she quit smoking about 2 years ago. Her smoking use included Cigarettes. She has a .25 pack-year smoking history. She has never used smokeless tobacco. She reports that she does not drink alcohol or use illicit drugs.  Allergies:  Allergies  Allergen Reactions  . Buspirone Other (See Comments)    Weakness  . Citalopram Other (See Comments)  . Lisinopril Cough  . Metformin Diarrhea    At 1000 mg dose  . Pravastatin Other (See Comments)    insomnia  . Sitagliptin Other (See Comments)    constipation  . Tramadol Itching  . Diltiazem Hcl Palpitations  . Gabapentin Palpitations  . Hydralazine Rash  . Lovastatin Palpitations    Medications: I have reviewed the patient's current medications.  Results for orders placed or performed during the hospital encounter of 07/12/15 (from the past 48 hour(s))  Glucose, capillary     Status: None   Collection Time: 07/18/15  4:47 PM  Result Value Ref Range   Glucose-Capillary 99 65 - 99 mg/dL  Glucose, capillary     Status: Abnormal   Collection Time: 07/18/15  9:42 PM  Result Value Ref Range   Glucose-Capillary 101 (H) 65 - 99 mg/dL  CBC with Differential/Platelet     Status: Abnormal   Collection Time: 07/19/15  5:13 AM  Result Value Ref Range   WBC 10.4 3.6 - 11.0 K/uL   RBC 3.56 (L) 3.80 - 5.20 MIL/uL   Hemoglobin 9.0 (L) 12.0 - 16.0 g/dL   HCT 28.8 (L) 35.0 - 47.0 %   MCV 80.7 80.0 - 100.0 fL   MCH 25.3 (L) 26.0 - 34.0 pg   MCHC 31.3 (L) 32.0 - 36.0 g/dL   RDW 18.0 (H) 11.5 - 14.5 %   Platelets 289 150 - 440 K/uL   Neutrophils Relative % 72% %   Neutro Abs 7.5 (H) 1.4 - 6.5 K/uL   Lymphocytes Relative 12% %   Lymphs Abs 1.3 1.0 - 3.6 K/uL   Monocytes Relative 8% %   Monocytes Absolute 0.9 0.2 - 0.9 K/uL   Eosinophils Relative 7% %   Eosinophils Absolute 0.8 (H) 0 - 0.7 K/uL    Basophils Relative 1% %   Basophils Absolute 0.1 0 - 0.1 K/uL  Renal function panel     Status: Abnormal   Collection Time: 07/19/15  5:13 AM  Result Value Ref Range   Sodium 140 135 - 145 mmol/L   Potassium 4.5 3.5 - 5.1 mmol/L   Chloride 113 (H) 101 - 111 mmol/L   CO2 19 (L) 22 - 32 mmol/L   Glucose, Bld 125 (H) 65 - 99 mg/dL   BUN 23 (H) 6 - 20 mg/dL   Creatinine, Ser 2.24 (H) 0.44 -   1.00 mg/dL   Calcium 8.0 (L) 8.9 - 10.3 mg/dL   Phosphorus 3.0 2.5 - 4.6 mg/dL   Albumin 2.7 (L) 3.5 - 5.0 g/dL   GFR calc non Af Amer 22 (L) >60 mL/min   GFR calc Af Amer 25 (L) >60 mL/min    Comment: (NOTE) The eGFR has been calculated using the CKD EPI equation. This calculation has not been validated in all clinical situations. eGFR's persistently <60 mL/min signify possible Chronic Kidney Disease.    Anion gap 8 5 - 15  Magnesium     Status: None   Collection Time: 07/19/15  7:43 AM  Result Value Ref Range   Magnesium 1.8 1.7 - 2.4 mg/dL  Phosphorus     Status: None   Collection Time: 07/19/15  7:43 AM  Result Value Ref Range   Phosphorus 3.6 2.5 - 4.6 mg/dL  Troponin I     Status: Abnormal   Collection Time: 07/19/15  7:43 AM  Result Value Ref Range   Troponin I 0.08 (H) <0.031 ng/mL    Comment: READ BACK AND VERIFIED WITH LAUREN HOBBS AT 3419 ON 07/19/15.Marland KitchenMarland KitchenWelling        PERSISTENTLY INCREASED TROPONIN VALUES IN THE RANGE OF 0.04-0.49 ng/mL CAN BE SEEN IN:       -UNSTABLE ANGINA       -CONGESTIVE HEART FAILURE       -MYOCARDITIS       -CHEST TRAUMA       -ARRYHTHMIAS       -LATE PRESENTING MYOCARDIAL INFARCTION       -COPD   CLINICAL FOLLOW-UP RECOMMENDED.   Glucose, capillary     Status: Abnormal   Collection Time: 07/19/15  7:55 AM  Result Value Ref Range   Glucose-Capillary 125 (H) 65 - 99 mg/dL  Glucose, capillary     Status: Abnormal   Collection Time: 07/19/15 11:33 AM  Result Value Ref Range   Glucose-Capillary 164 (H) 65 - 99 mg/dL   Comment 1 Notify RN   Glucose,  capillary     Status: Abnormal   Collection Time: 07/19/15  4:43 PM  Result Value Ref Range   Glucose-Capillary 146 (H) 65 - 99 mg/dL  Glucose, capillary     Status: Abnormal   Collection Time: 07/19/15  9:19 PM  Result Value Ref Range   Glucose-Capillary 121 (H) 65 - 99 mg/dL   Comment 1 Notify RN   Glucose, capillary     Status: Abnormal   Collection Time: 07/20/15  7:33 AM  Result Value Ref Range   Glucose-Capillary 112 (H) 65 - 99 mg/dL  Renal function panel     Status: Abnormal   Collection Time: 07/20/15 10:57 AM  Result Value Ref Range   Sodium 138 135 - 145 mmol/L   Potassium 4.9 3.5 - 5.1 mmol/L   Chloride 110 101 - 111 mmol/L   CO2 19 (L) 22 - 32 mmol/L   Glucose, Bld 143 (H) 65 - 99 mg/dL   BUN 30 (H) 6 - 20 mg/dL   Creatinine, Ser 2.44 (H) 0.44 - 1.00 mg/dL   Calcium 7.8 (L) 8.9 - 10.3 mg/dL   Phosphorus 4.4 2.5 - 4.6 mg/dL   Albumin 2.8 (L) 3.5 - 5.0 g/dL   GFR calc non Af Amer 20 (L) >60 mL/min   GFR calc Af Amer 23 (L) >60 mL/min    Comment: (NOTE) The eGFR has been calculated using the CKD EPI equation. This calculation has not been validated in  all clinical situations. eGFR's persistently <60 mL/min signify possible Chronic Kidney Disease.    Anion gap 9 5 - 15  Glucose, capillary     Status: Abnormal   Collection Time: 07/20/15 11:41 AM  Result Value Ref Range   Glucose-Capillary 115 (H) 65 - 99 mg/dL    No results found.  Review of Systems  Constitutional: Positive for malaise/fatigue.  HENT: Negative.   Eyes: Negative.   Respiratory: Positive for shortness of breath.   Cardiovascular: Positive for palpitations and leg swelling.  Gastrointestinal: Positive for heartburn, abdominal pain and constipation.  Genitourinary: Negative.   Musculoskeletal: Negative.   Skin: Negative.   Neurological: Positive for weakness.  Endo/Heme/Allergies: Negative.   Psychiatric/Behavioral: Negative.    Blood pressure 153/90, pulse 58, temperature 98.2 F (36.8  C), temperature source Axillary, resp. rate 18, height 5' 3" (1.6 m), weight 92.987 kg (205 lb), SpO2 96 %. Physical Exam  Nursing note and vitals reviewed. Constitutional: She is oriented to person, place, and time. She appears well-developed and well-nourished.  HENT:  Head: Normocephalic and atraumatic.  Eyes: Conjunctivae and EOM are normal. Pupils are equal, round, and reactive to light.  Neck: Normal range of motion.  Cardiovascular: Normal pulses.  An irregularly irregular rhythm present.  Extrasystoles are present. Tachycardia present.   Murmur heard.  Systolic murmur is present with a grade of 2/6  Respiratory: Effort normal and breath sounds normal.  GI: She exhibits distension. There is tenderness.  Musculoskeletal: Normal range of motion.  Neurological: She is alert and oriented to person, place, and time. She has normal reflexes.  Skin: Skin is warm and dry.  Psychiatric: She has a normal mood and affect.    Assessment/Plan: Atrial flutter/atrial fibrillation Obesity Postop colostomy reversal Abdominal pain Tachycardia GI bleeding Diabetes Hyperlipidemia GERD History of diverticulitis . PLAN Agree with telemetry Recommend rate control at amiodarone load metoprolol for rate control Poor anticoagulation candidate because history of GI bleeding Insulin therapy for  diabetes management while in the hospital Continue Lipitor therapy for lipid management Protonix to help with reflux symptoms Maintain levothyroxine for thyroid management Continue Protonix therapy for reflux symptoms   Jaymeson Mengel D. 07/20/2015, 1:22 PM

## 2015-07-21 LAB — RENAL FUNCTION PANEL
ANION GAP: 10 (ref 5–15)
Albumin: 2.5 g/dL — ABNORMAL LOW (ref 3.5–5.0)
BUN: 33 mg/dL — AB (ref 6–20)
CHLORIDE: 108 mmol/L (ref 101–111)
CO2: 20 mmol/L — ABNORMAL LOW (ref 22–32)
Calcium: 7.6 mg/dL — ABNORMAL LOW (ref 8.9–10.3)
Creatinine, Ser: 2.4 mg/dL — ABNORMAL HIGH (ref 0.44–1.00)
GFR calc non Af Amer: 20 mL/min — ABNORMAL LOW (ref >60)
GFR, EST AFRICAN AMERICAN: 23 mL/min — AB (ref >60)
Glucose, Bld: 118 mg/dL — ABNORMAL HIGH (ref 65–99)
POTASSIUM: 4.5 mmol/L (ref 3.5–5.1)
Phosphorus: 4.2 mg/dL (ref 2.5–4.6)
Sodium: 138 mmol/L (ref 135–145)

## 2015-07-21 LAB — GLUCOSE, CAPILLARY
GLUCOSE-CAPILLARY: 106 mg/dL — AB (ref 65–99)
GLUCOSE-CAPILLARY: 173 mg/dL — AB (ref 65–99)
GLUCOSE-CAPILLARY: 171 mg/dL — AB (ref 65–99)
Glucose-Capillary: 121 mg/dL — ABNORMAL HIGH (ref 65–99)

## 2015-07-21 MED ORDER — AMLODIPINE BESYLATE 5 MG PO TABS
5.0000 mg | ORAL_TABLET | Freq: Every day | ORAL | Status: DC
  Administered 2015-07-21 – 2015-07-22 (×2): 5 mg via ORAL
  Filled 2015-07-21 (×2): qty 1

## 2015-07-21 NOTE — Evaluation (Signed)
Physical Therapy Evaluation Patient Details Name: [name] MRN: [id number] DOB: [birth year] Today's Date: 07/21/2015   History of Present Illness  65 yo F presented to hospital for a colostomy takedown/reversal procedure on 5/24.   Clinical Impression  Pt demonstrated generalized weakness and difficulty walking with decreased activity tolerance after extended hospital stay. She requires min guard for transfers and ambulation up to 200 ft with SPC with slow but steady mobility. No concerns for safety. No LOB during session. Vitals monitored during session. HHPT recommended after hospital discharge to address deficits of strength, endurance, and gait to progress towards PLOF. Pt will benefit from skilled PT services to increase functional I and mobility for safe discharge.     Follow Up Recommendations Home health PT;Supervision - Intermittent    Equipment Recommendations  None recommended by PT    Recommendations for Other Services       Precautions / Restrictions Precautions Precautions: None Restrictions Weight Bearing Restrictions: No      Mobility  Bed Mobility Overal bed mobility: Independent                Transfers Overall transfer level: Needs assistance Equipment used: Straight cane Transfers: Sit to/from Stand;Stand Pivot Transfers Sit to Stand: Min guard Stand pivot transfers: Min guard       General transfer comment: steady with no LOB or safety concerns  Ambulation/Gait Ambulation/Gait assistance: Min guard Ambulation Distance (Feet): 200 Feet Assistive device: Straight cane Gait Pattern/deviations: Decreased stride length Gait velocity: reduced Gait velocity interpretation: Below normal speed for age/gender General Gait Details: Slow but steady gait. Fatigues quickly requiring standing rest breaks for energy conservation. No buckling or LOB.  Stairs            Wheelchair Mobility    Modified Rankin (Stroke Patients Only)        Balance Overall balance assessment: Needs assistance Sitting-balance support: Feet supported Sitting balance-Leahy Scale: Normal     Standing balance support: Single extremity supported Standing balance-Leahy Scale: Good Standing balance comment: steady with no LOB                             Pertinent Vitals/Pain Pain Assessment: No/denies pain    Home Living Family/patient expects to be discharged to:: Private residence Living Arrangements: Children Available Help at Discharge: Family Type of Home: Apartment Home Access: Level entry     Home Layout: One level Home Equipment: Environmental consultant - 4 wheels;Cane - single point;Shower seat      Prior Function Level of Independence: Independent with assistive device(s)         Comments: Use of SPC or rollator     Hand Dominance   Dominant Hand: Right    Extremity/Trunk Assessment   Upper Extremity Assessment: Generalized weakness           Lower Extremity Assessment: Generalized weakness (grossly 4/5)         Communication   Communication: No difficulties  Cognition Arousal/Alertness: Awake/alert Behavior During Therapy: WFL for tasks assessed/performed Overall Cognitive Status: Within Functional Limits for tasks assessed                      General Comments General comments (skin integrity, edema, etc.): LLQ surgical site    Exercises Other Exercises Other Exercises: B LE seated therex: ankle pumps, LAQs, marching, hip add squeezes, hip abd and heelslides with manual resistance x10 each. Cues for technique. Therapeutic rest  breaks for energy conservation.      Assessment/Plan    PT Assessment Patient needs continued PT services  PT Diagnosis Difficulty walking;Generalized weakness   PT Problem List Decreased strength;Decreased activity tolerance;Decreased balance;Cardiopulmonary status limiting activity;Decreased skin integrity  PT Treatment Interventions Gait training;Therapeutic  activities;Therapeutic exercise;Balance training;Neuromuscular re-education;Patient/family education   PT Goals (Current goals can be found in the Care Plan section) Acute Rehab PT Goals Patient Stated Goal: to go home PT Goal Formulation: With patient Time For Goal Achievement: 08/04/15 Potential to Achieve Goals: Good    Frequency Min 2X/week   Barriers to discharge   none    Co-evaluation               End of Session Equipment Utilized During Treatment: Gait belt Activity Tolerance: Patient tolerated treatment well;Patient limited by fatigue Patient left: in chair;with call bell/phone within reach Nurse Communication: Mobility status         Time: [id number] PT Time Calculation (min) (ACUTE ONLY): 26 min   Charges:   PT Evaluation $PT Eval Low Complexity: 1 Procedure PT Treatments $Therapeutic Exercise: 8-22 mins   PT G Codes:        Neoma Laming, PT, DPT  07/21/2015, 4:04 PM [phone]

## 2015-07-21 NOTE — Care Management (Signed)
PT has recommended home health PT.  Patient has been open with [facility] in the past and would like to use them again. I have given a heads up referral to Tim with [facility]

## 2015-07-21 NOTE — Consult Note (Signed)
   [facility] Inpatient Consult   07/21/2015  [name] [birth year] [id number]   Patient is currently active with [facility] for chronic disease management services.  Patient has been engaged by a Warden/ranger.  Our community based plan of care has focused on disease management and community resource support.  Patient will receive a post discharge transition of care call and will be evaluated for monthly home visits for assessments and disease process education.  Will make Inpatient Case Manager aware that [facility] following. Of note, [facility] Care Management services does not replace or interfere with any services that are needed or arranged by inpatient case management or social work.  For additional questions or referrals please contact:  Kassaundra Hair RN, [facility] Liaison  [phone]) [place] [phone]) Toll free office

## 2015-07-21 NOTE — Progress Notes (Signed)
Initial Nutrition Assessment  DOCUMENTATION CODES:   Obesity unspecified  INTERVENTION:  -monitor intake and cater to pt preferences. Encouraged low fiber foods at this time   NUTRITION DIAGNOSIS:    (none at this time) related to   as evidenced by  .    GOAL:   Patient will meet greater than or equal to 90% of their needs    MONITOR:   PO intake  REASON FOR ASSESSMENT:   LOS    ASSESSMENT:      Pt s/p colostomy reversal, went into aflutter but that is controlled at this time   Past Medical History  Diagnosis Date  . Diabetes mellitus without complication ([place])   . GI bleed   . Aortic valve stenosis   . Hyperlipidemia   . Hypertension   . Diverticulitis large intestine   . Obesity   . Shortness of breath dyspnea   . Anxiety   . Neuropathy in diabetes ([place])   . Anemia   . Vertigo   . Bowel obstruction ([place])   . Hypothyroidism   . Arthritis     feet, legs  . Colostomy in place [facility])   . MRSA (methicillin resistant Staphylococcus aureus)     at abdominal wound.  Jan 2017.  Treated.   Marland Kitchen Heart murmur   . CKD (chronic kidney disease)     protein in urine  . Ectopic atrial tachycardia ([place])   . Dysrhythmia   . Neuropathy ([place])   . Gastritis    Pt eating toast this am for breakfast. Reports intake during admission has been fair. Noted per I and O sheet intake 100%, 90%, 0%, documented. Pt reports normal appetite prior to admission  Medications reviewed Labs reviewed BUN 33, creatinine 2.40, glucose 118  Diet Order:  Diet regular Room service appropriate?: Yes; Fluid consistency:: Thin  Skin:  Reviewed, no issues  Last BM:  6/1  Height:   Ht Readings from Last 1 Encounters:  07/12/15 5\' 3"  (1.6 m)    Weight: stable wt per pt  Wt Readings from Last 1 Encounters:  07/12/15 205 lb (92.987 kg)    Ideal Body Weight:     BMI:  Body mass index is 36.32 kg/(m^2).  Estimated Nutritional Needs:   Kcal:  1900-2100 kcals/d  Protein:  95-105  g/d  Fluid:  1.9-2.0 L/d  EDUCATION NEEDS:   No education needs identified at this time  [name] B. Zenia Resides, [place], [place] [facility] (pager) Weekend/On-Call pager [phone])

## 2015-07-21 NOTE — Progress Notes (Signed)
Alert and oriented. Complained of incision pain once, given vicodin. Patient has ambulated well with assistance to the bathroom today. Abdominal incision cleansed and dressing changed, minimal drainage in two areas. Patient will most likely discharge today. Has remained NSR today other than one short episode of afib this AM. BP has been elevated, patient started on new norvasc this afternoon. Will continue to monitor.

## 2015-07-21 NOTE — Progress Notes (Signed)
9 Days Post-Op  Subjective: Status post colostomy closure. It appears that the atrial flutter atrial fibrillation and has diminished and converted she is no longer on an amiodarone drip. She feels well as tolerating a regular diet having bowel movements.  She lives at home with her daughter and would like to go home to that situation rather than a rehabilitation facility  Objective: Vital signs in last 24 hours: Temp:  [98 F (36.7 C)-98.2 F (36.8 C)] 98 F (36.7 C) (06/02 0402) Pulse Rate:  [56-67] 65 (06/02 0947) Resp:  [16-18] 16 (06/02 0811) BP: (153-177)/(60-91) 177/91 mmHg (06/02 0811) SpO2:  [96 %-98 %] 98 % (06/02 0811) Last BM Date: 07/20/15  Intake/Output from previous day: 06/01 0701 - 06/02 0700 In: 480 [P.O.:480] Out: 750 [Urine:750] Intake/Output this shift:    Physical exam:  Patient is awake alert oriented vital signs are stable she is afebrile heart rate is normal. Abdomen is soft nondistended nontympanitic nontender wounds are clean no erythema minimal drainage from the lateral incision where the Penrose drain had been. Nontender calves.  Lab Results: CBC   Recent Labs  07/19/15 0513  WBC 10.4  HGB 9.0*  HCT 28.8*  PLT 289   BMET  Recent Labs  07/20/15 1057 07/21/15 0511  NA 138 138  K 4.9 4.5  CL 110 108  CO2 19* 20*  GLUCOSE 143* 118*  BUN 30* 33*  CREATININE 2.44* 2.40*  CALCIUM 7.8* 7.6*   PT/INR No results for input(s): LABPROT, INR in the last 72 hours. ABG No results for input(s): PHART, HCO3 in the last 72 hours.  Invalid input(s): PCO2, PO2  Studies/Results: No results found.  Anti-infectives: Anti-infectives    Start     Dose/Rate Route Frequency Ordered Stop   07/12/15 0110  cefoTEtan (CEFOTAN) 2 g in dextrose 5 % 50 mL IVPB     2 g 100 mL/hr over 30 Minutes Intravenous On call to O.R. 07/12/15 0110 07/12/15 0750      Assessment/Plan: s/p Procedure(s): COLOSTOMY REVERSAL EXPLORATORY LAPAROTOMY LAPAROSCOPY  DIAGNOSTIC LYSIS OF ADHESION   Patient doing very well off the amiodarone drip will await further input from internal medicine but she should be able to go home soon and I will see her later today for that decision*  Florene Glen, MD, FACS  07/21/2015

## 2015-07-21 NOTE — Progress Notes (Signed)
[facility] at [place] NAME: [name]    MR#:  [id number]  DATE OF BIRTH:  04-27-1950  SUBJECTIVE: she denies any complaints.HR better but BP high at 170/.91.  CHIEF COMPLAINT:  No chief complaint on file.   REVIEW OF SYSTEMS:    Review of Systems  Constitutional: Negative for fever and chills.  HENT: Negative for hearing loss.   Eyes: Negative for blurred vision, double vision and photophobia.  Respiratory: Negative for cough, hemoptysis and shortness of breath.   Cardiovascular: Negative for palpitations, orthopnea and leg swelling.  Gastrointestinal: Negative for vomiting, abdominal pain and diarrhea.  Genitourinary: Negative for dysuria and urgency.  Musculoskeletal: Negative for myalgias and neck pain.  Skin: Negative for rash.  Neurological: Negative for dizziness, focal weakness, seizures, weakness and headaches.  Psychiatric/Behavioral: Negative for memory loss. The patient does not have insomnia.     Nutrition: Tolerating Diet: Tolerating PT:      DRUG ALLERGIES:   Allergies  Allergen Reactions  . Buspirone Other (See Comments)    Weakness  . Citalopram Other (See Comments)  . Lisinopril Cough  . Metformin Diarrhea    At 1000 mg dose  . Pravastatin Other (See Comments)    insomnia  . Sitagliptin Other (See Comments)    constipation  . Tramadol Itching  . Diltiazem Hcl Palpitations  . Gabapentin Palpitations  . Hydralazine Rash  . Lovastatin Palpitations    VITALS:  Blood pressure 172/87, pulse 55, temperature 98 F (36.7 C), temperature source Oral, resp. rate 20, height 5\' 3"  (1.6 m), weight 92.987 kg (205 lb), SpO2 98 %.  PHYSICAL EXAMINATION:   Physical Exam  GENERAL:  65 y.o.-year-old patient lying in the bed with no acute distress.  EYES: Pupils equal, round, reactive to light and accommodation. No scleral icterus. Extraocular muscles intact.  HEENT: Head atraumatic, normocephalic.  Oropharynx and nasopharynx clear.  NECK:  Supple, no jugular venous distention. No thyroid enlargement, no tenderness.  LUNGS: Normal breath sounds bilaterally, no wheezing, rales,rhonchi or crepitation. No use of accessory muscles of respiration.  CARDIOVASCULAR: S1, S2 normal. No murmurs, rubs, or gallops.  ABDOMEN: Soft, nontender, nondistended. Bowel sounds present. No organomegaly or mass. incision  site is clean.not tender in sutures, EXTREMITIES: No pedal edema, cyanosis, or clubbing.  NEUROLOGIC: Cranial nerves II through XII are intact. Muscle strength 5/5 in all extremities. Sensation intact. Gait not checked.  PSYCHIATRIC: The patient is alert and oriented x 3.  SKIN: No obvious rash, lesion, or ulcer.    LABORATORY PANEL:   CBC  Recent Labs Lab 07/19/15 0513  WBC 10.4  HGB 9.0*  HCT 28.8*  PLT 289   ------------------------------------------------------------------------------------------------------------------  Chemistries   Recent Labs Lab 07/19/15 0743  07/21/15 0511  NA  --   < > 138  K  --   < > 4.5  CL  --   < > 108  CO2  --   < > 20*  GLUCOSE  --   < > 118*  BUN  --   < > 33*  CREATININE  --   < > 2.40*  CALCIUM  --   < > 7.6*  MG 1.8  --   --   < > = values in this interval not displayed. ------------------------------------------------------------------------------------------------------------------  Cardiac Enzymes  Recent Labs Lab 07/19/15 0743  TROPONINI 0.08*   ------------------------------------------------------------------------------------------------------------------  RADIOLOGY:  No results found.   ASSESSMENT AND PLAN:   Active Problems:  H/O colostomy Atrial flutter; status post amiodarone drip. Heart rate in 60s.d/c amiodarone drip.  Started  on metoprolol tartrate 50 mg 3 times a day by cardiology. But concern about bradycardia with a higher dose. Decrease to metoprolol tartrate 25 mg twice a day, start amiodarone 100  MG by mouth twice a day. Appreciate cardiology input.  Acute renal failure on chronic renal failure with chronic kidney disease stage III: ATN likely due to hypotension Improving with IV hydration. Renal function improving.. renal ultrasound showed medical renal disease. No obstruction., baseline creatinine is 1.6. Function is improving slowly. Added bicarb for metabolic acidosis.    #2 diabetes mellitus type 2: resolved  Hypoglycemia. I#3. Essential hypertension;uncontrolled.added Norvasc. #3 hypothyroidism  #4 diabetes mellitus type 2: Hypoglycemia improved. Continue sliding scale with coverage.   She feels much better than yesterday. Continue monitoring on telemetry, Likely can be discharged home am.  All the records are reviewed and case discussed with Care Management/Social Workerr. Management plans discussed with the patient, family and they are in agreement.  CODE STATUS: full  TOTAL TIME TAKING CARE OF THIS PATIENT: 35  minutes. (CCT)  POSSIBLE D/C IN  3-4 DAYS, DEPENDING ON CLINICAL CONDITION.   Epifanio Lesches M.D on 07/21/2015 at 3:47 PM  Between 7am to 6pm - Pager - [phone]  After 6pm go to [email or web address] - password [email or web address] [facility]  [facility]  Office  [phone]  CC: Primary care physician; Adin Hector, MD

## 2015-07-21 NOTE — Progress Notes (Signed)
While changing dressing to abdomen and assessing patient, heart rhythm went back into afib for a short period of time. Heart rate ranged from 120 to 130 from 0952 until 1008. Patient stated she did feel like her heart was beating a little faster. Patient had just taken her amiodarone and metoprolol. Now back in NSR in the 70's. Early this AM patient was in NSR high 50's.

## 2015-07-22 LAB — BASIC METABOLIC PANEL
Anion gap: 8 (ref 5–15)
BUN: 33 mg/dL — AB (ref 6–20)
CHLORIDE: 110 mmol/L (ref 101–111)
CO2: 20 mmol/L — ABNORMAL LOW (ref 22–32)
Calcium: 7.6 mg/dL — ABNORMAL LOW (ref 8.9–10.3)
Creatinine, Ser: 2.33 mg/dL — ABNORMAL HIGH (ref 0.44–1.00)
GFR calc Af Amer: 24 mL/min — ABNORMAL LOW (ref >60)
GFR calc non Af Amer: 21 mL/min — ABNORMAL LOW (ref >60)
GLUCOSE: 115 mg/dL — AB (ref 65–99)
POTASSIUM: 4.2 mmol/L (ref 3.5–5.1)
Sodium: 138 mmol/L (ref 135–145)

## 2015-07-22 LAB — CBC
HCT: 28 % — ABNORMAL LOW (ref 35.0–47.0)
Hemoglobin: 8.9 g/dL — ABNORMAL LOW (ref 12.0–16.0)
MCH: 24.7 pg — ABNORMAL LOW (ref 26.0–34.0)
MCHC: 31.7 g/dL — AB (ref 32.0–36.0)
MCV: 77.8 fL — AB (ref 80.0–100.0)
PLATELETS: 315 10*3/uL (ref 150–440)
RBC: 3.6 MIL/uL — ABNORMAL LOW (ref 3.80–5.20)
RDW: 17.5 % — AB (ref 11.5–14.5)
WBC: 8.4 10*3/uL (ref 3.6–11.0)

## 2015-07-22 LAB — GLUCOSE, CAPILLARY
Glucose-Capillary: 120 mg/dL — ABNORMAL HIGH (ref 65–99)
Glucose-Capillary: 137 mg/dL — ABNORMAL HIGH (ref 65–99)

## 2015-07-22 MED ORDER — AMIODARONE HCL 100 MG PO TABS: 100.0000 mg | ORAL_TABLET | Freq: Two times a day (BID) | ORAL | Status: DC

## 2015-07-22 MED ORDER — SODIUM BICARBONATE 650 MG PO TABS: 650.0000 mg | ORAL_TABLET | Freq: Two times a day (BID) | ORAL | Status: DC

## 2015-07-22 MED ORDER — AMLODIPINE BESYLATE 5 MG PO TABS: 5.0000 mg | ORAL_TABLET | Freq: Every day | ORAL | Status: DC

## 2015-07-22 MED ORDER — OXYCODONE-ACETAMINOPHEN 10-325 MG PO TABS: 1.0000 | ORAL_TABLET | ORAL | Status: DC | PRN

## 2015-07-22 NOTE — Progress Notes (Signed)
Patient d/c'd with home with [facility]. Education provided, and dressing on abd changed. Family present during dressing change and educated on how to change dressing. No questions at this time. Patient picked up by sister. Telemetry removed. Wilnette Kales

## 2015-07-22 NOTE — Progress Notes (Signed)
[facility] at [place] NAME: [name]    MRN#:  [id number]  DATE OF BIRTH:  1950-02-21  SUBJECTIVE:  Hospital Day: 10 days [name] is a 65 y.o. female presenting with No complaints at this time.   Overnight events: No overnight events Interval Events: No complaints at this time  REVIEW OF SYSTEMS:  CONSTITUTIONAL: No fever, fatigue or weakness.  EYES: No blurred or double vision.  EARS, NOSE, AND THROAT: No tinnitus or ear pain.  RESPIRATORY: No cough, shortness of breath, wheezing or hemoptysis.  CARDIOVASCULAR: No chest pain, orthopnea, edema.  GASTROINTESTINAL: No nausea, vomiting, diarrhea or abdominal pain.  GENITOURINARY: No dysuria, hematuria.  ENDOCRINE: No polyuria, nocturia,  HEMATOLOGY: No anemia, easy bruising or bleeding SKIN: No rash or lesion. MUSCULOSKELETAL: No joint pain or arthritis.   NEUROLOGIC: No tingling, numbness, weakness.  PSYCHIATRY: No anxiety or depression.   DRUG ALLERGIES:   Allergies  Allergen Reactions  . Buspirone Other (See Comments)    Weakness  . Citalopram Other (See Comments)  . Lisinopril Cough  . Metformin Diarrhea    At 1000 mg dose  . Pravastatin Other (See Comments)    insomnia  . Sitagliptin Other (See Comments)    constipation  . Tramadol Itching  . Diltiazem Hcl Palpitations  . Gabapentin Palpitations  . Hydralazine Rash  . Lovastatin Palpitations    VITALS:  Blood pressure 154/79, pulse 57, temperature 97.8 F (36.6 C), temperature source Oral, resp. rate 18, height 5\' 3"  (1.6 m), weight 205 lb (92.987 kg), SpO2 98 %.  PHYSICAL EXAMINATION:  VITAL SIGNS: Filed Vitals:   07/22/15 1054 07/22/15 1132  BP: 157/65 154/79  Pulse: 62 57  Temp:  97.8 F (36.6 C)  Resp:  18   GENERAL:65 y.o.female currently in no acute distress.  HEAD: Normocephalic, atraumatic.  EYES: Pupils equal, round, reactive to light. Extraocular muscles intact. No scleral icterus.   MOUTH: Moist mucosal membrane. Dentition intact. No abscess noted.  EAR, NOSE, THROAT: Clear without exudates. No external lesions.  NECK: Supple. No thyromegaly. No nodules. No JVD.  PULMONARY: Clear to ascultation, without wheeze rails or rhonci. No use of accessory muscles, Good respiratory effort. good air entry bilaterally CHEST: Nontender to palpation.  CARDIOVASCULAR: S1 and S2. Regular rate and rhythm. 3/6 sem , rubs, or gallops. No edema. Pedal pulses 2+ bilaterally.  GASTROINTESTINAL: Soft, nontender, nondistended. No masses. Positive bowel sounds. No hepatosplenomegaly.  MUSCULOSKELETAL: No swelling, clubbing, or edema. Range of motion full in all extremities.  NEUROLOGIC: Cranial nerves II through XII are intact. No gross focal neurological deficits. Sensation intact. Reflexes intact.  SKIN: No ulceration, lesions, rashes, or cyanosis. Skin warm and dry. Turgor intact.  PSYCHIATRIC: Mood, affect within normal limits. The patient is awake, alert and oriented x 3. Insight, judgment intact.      LABORATORY PANEL:   CBC  Recent Labs Lab 07/22/15 0556  WBC 8.4  HGB 8.9*  HCT 28.0*  PLT 315   ------------------------------------------------------------------------------------------------------------------  Chemistries   Recent Labs Lab 07/19/15 0743  07/22/15 0556  NA  --   < > 138  K  --   < > 4.2  CL  --   < > 110  CO2  --   < > 20*  GLUCOSE  --   < > 115*  BUN  --   < > 33*  CREATININE  --   < > 2.33*  CALCIUM  --   < >  7.6*  MG 1.8  --   --   < > = values in this interval not displayed. ------------------------------------------------------------------------------------------------------------------  Cardiac Enzymes  Recent Labs Lab 07/19/15 0743  TROPONINI 0.08*   ------------------------------------------------------------------------------------------------------------------  RADIOLOGY:  No results found.  EKG:   Orders placed or performed  during the hospital encounter of 07/12/15  . EKG 12-Lead  . EKG 12-Lead    ASSESSMENT AND PLAN:   [name] is a 65 y.o. female presenting with No chief complaint on file. . Admitted 07/12/2015 : Day #: 10 days  Atrial flutter; metoprolol tartrate 25 mg twice a day, start amiodarone 100 MG by mouth twice a day. Appreciate cardiology input.  Acute renal failure on chronic renal failure with chronic kidney disease stage III: . Improved    diabetes mellitus type 2: Insulin sliding scale . Essential hypertension;uncontrolled.added Norvasc.   All the records are reviewed and case discussed with Care Management/Social Workerr. Management plans discussed with the patient, family and they are in agreement.  CODE STATUS: full TOTAL TIME TAKING CARE OF THIS PATIENT: 28 minutes.   POSSIBLE D/C IN 1DAYS, DEPENDING ON CLINICAL CONDITION.   Hower,  Karenann Cai.D on 07/22/2015 at 12:43 PM  Between 7am to 6pm - Pager - [phone]  After 6pm: House Pager: - [phone]  [facility]  Office  [phone]  CC: Primary care physician; Adin Hector, MD

## 2015-07-22 NOTE — Discharge Instructions (Signed)
Regular diet May shower Dry dressing as needed Follow-up with Dr. Adonis Huguenin in 10 days for staple removal

## 2015-07-22 NOTE — Progress Notes (Signed)
[name]  ROUNDING NOTE   Subjective:   Off IV fluids Creatinine 2.33 (2.4)  Objective:  Vital signs in last 24 hours:  Temp:  [98 F (36.7 C)] 98 F (36.7 C) (06/03 0438) Pulse Rate:  [29-108] 83 (06/03 0438) Resp:  [18-20] 18 (06/03 0438) BP: (156-179)/(87-98) 179/89 mmHg (06/03 0438) SpO2:  [95 %-98 %] 95 % (06/03 0438)  Weight change:  Filed Weights   07/12/15 0621  Weight: 92.987 kg (205 lb)    Intake/Output: I/O last 3 completed shifts: In: 600 [P.O.:600] Out: 750 [Urine:750]   Intake/Output this shift:     Physical Exam: General: NAD, laying in bed  Head: Normocephalic, atraumatic. Moist oral mucosal membranes  Eyes: Anicteric  Neck: Supple, trachea midline  Lungs:  Clear to auscultation, normal effort  Heart: S1S2 no rubs  Abdomen:  Soft, clean dressings  BS present  Extremities:  no peripheral edema.  Neurologic: Nonfocal, moving all four extremities  Skin: No lesions       Basic Metabolic Panel:  Recent Labs Lab 07/18/15 0445 07/19/15 0513 07/19/15 0743 07/20/15 1057 07/21/15 0511 07/22/15 0556  NA 140 140  --  138 138 138  K 5.4* 4.5  --  4.9 4.5 4.2  CL 115* 113*  --  110 108 110  CO2 19* 19*  --  19* 20* 20*  GLUCOSE 110* 125*  --  143* 118* 115*  BUN 22* 23*  --  30* 33* 33*  CREATININE 2.46* 2.24*  --  2.44* 2.40* 2.33*  CALCIUM 8.1* 8.0*  --  7.8* 7.6* 7.6*  MG 1.8  --  1.8  --   --   --   PHOS  --  3.0 3.6 4.4 4.2  --     Liver Function Tests:  Recent Labs Lab 07/19/15 0513 07/20/15 1057 07/21/15 0511  ALBUMIN 2.7* 2.8* 2.5*   No results for input(s): LIPASE, AMYLASE in the last 168 hours. No results for input(s): AMMONIA in the last 168 hours.  CBC:  Recent Labs Lab 07/18/15 0445 07/19/15 0513 07/22/15 0556  WBC 11.1* 10.4 8.4  NEUTROABS  --  7.5*  --   HGB 8.8* 9.0* 8.9*  HCT 28.0* 28.8* 28.0*  MCV 78.1* 80.7 77.8*  PLT 277 289 315    Cardiac Enzymes:  Recent Labs Lab 07/19/15 0743   TROPONINI 0.08*    BNP: Invalid input(s): POCBNP  CBG:  Recent Labs Lab 07/21/15 0738 07/21/15 1109 07/21/15 1634 07/21/15 2055 07/22/15 0722  GLUCAP 106* 173* 121* 171* 120*    Microbiology: Results for orders placed or performed during the hospital encounter of 07/07/15  Surgical pcr screen     Status: Abnormal   Collection Time: 07/07/15  1:51 PM  Result Value Ref Range Status   MRSA, PCR NEGATIVE NEGATIVE Final   Staphylococcus aureus POSITIVE (A) NEGATIVE Final    Comment:        The Xpert SA Assay (FDA approved for NASAL specimens in patients over 35 years of age), is one component of a comprehensive surveillance program.  Test performance has been validated by [facility] for patients greater than or equal to 39 year old. It is not intended to diagnose infection nor to guide or monitor treatment.     Coagulation Studies: No results for input(s): LABPROT, INR in the last 72 hours.  Urinalysis: No results for input(s): COLORURINE, LABSPEC, PHURINE, GLUCOSEU, HGBUR, BILIRUBINUR, KETONESUR, PROTEINUR, UROBILINOGEN, NITRITE, LEUKOCYTESUR in the last 72 hours.  Invalid  input(s): APPERANCEUR    Imaging: No results found.   Medications:     . amiodarone  100 mg Oral BID  . amLODipine  5 mg Oral Daily  . atorvastatin  40 mg Oral QHS  . DULoxetine  30 mg Oral Daily  . heparin subcutaneous  5,000 Units Subcutaneous Q8H  . insulin aspart  0-15 Units Subcutaneous TID WC  . levothyroxine  88 mcg Oral BH-q7a  . metoprolol tartrate  25 mg Oral BID  . pantoprazole  40 mg Oral QHS  . sodium bicarbonate  650 mg Oral BID   diphenhydrAMINE **OR** diphenhydrAMINE, HYDROcodone-acetaminophen, metoprolol, morphine injection  Assessment/ Plan:  65 y.o. 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   1. Acute renal failure with hyperkalemia and metabolic acidosis on chronic kidney disease stage III. Baseline creatinine ranging from 1.6-2.2. Now close to baseline.  Acute renal failure due to perioperative hypotension. However creatinine seems to fluctuate.  - Discontinued IV fluids  - Continue sodium bicarbonate for hyperkalemia and metabolic acidosis.   2. Anemia of chronic kidney disease.  - continue to monitor   3. Hypertension with atrial fibrillation:  - amiodarone and metoprolol.   Will need outpatient follow up with Nephrology.    LOS: [place], [place] 6/3/20179:37 AM

## 2015-07-22 NOTE — Care Management Note (Signed)
Case Management Note  Patient Details  Name: AMAZIAH SONA MRN: [id number] Date of Birth: [birth year]  Subjective/Objective:      Home Health PT called to Ar[facility] at [facility].               Action/Plan:   Expected Discharge Date:                  Expected Discharge Plan:     In-House Referral:     Discharge planning Services     Post Acute Care Choice:    Choice offered to:     DME Arranged:    DME Agency:     HH Arranged:    [facility]:     Status of Service:     Medicare Important Message Given:  Yes Date Medicare IM Given:    Medicare IM give by:    Date Additional Medicare IM Given:    Additional Medicare Important Message give by:     If discussed at [place] of Stay Meetings, dates discussed:    Additional Comments:  Deantae Shackleton A, RN 07/22/2015, 11:01 AM

## 2015-07-22 NOTE — Discharge Summary (Signed)
Physician Discharge Summary  Patient ID: MACKINZE KOPEC MRN: [id number] DOB/AGE: June 02, 1950 66 y.o.  Admit date: 07/12/2015 Discharge date: 07/22/2015   Discharge Diagnoses:  Active Problems:   H/O colostomy   Procedures:Ostomy closure  Hospital Course: This patient admitted the hospital for elective colostomy closure by Dr. Adonis Huguenin. She is some postoperative ileus but progressed fairly well until she had a bout of atrial fibrillation which required an amiodarone drip. She also experienced worsening chronic renal failure which has almost reverse itself this point her baseline creatinine being approximately 1.6-1.8 and now her creatinine is 2.3 and improving. Nephrology cardiology nutrition and internal medicine were all consult to assist in the care of this patient. Currently she is tolerating a regular diet her creatinine is improved her heart rate is normal and she is discharged to follow-up in our office in 10 days. A Penrose drain has been removed and her wound has minimal drainage He is instructed to wear a dry dressing as needed and to shower she will follow-up in our office in 10 days for staple removal  Consults: Internal medicine, nephrology, nutrition, cardiology  Disposition: 01-Home or Self Care     Medication List    TAKE these medications        amiodarone 100 MG tablet  Commonly known as:  PACERONE  Take 1 tablet (100 mg total) by mouth 2 (two) times daily.     amLODipine 5 MG tablet  Commonly known as:  NORVASC  Take 1 tablet (5 mg total) by mouth daily.     atorvastatin 40 MG tablet  Commonly known as:  LIPITOR  Take 40 mg by mouth at bedtime.     calcium carbonate 500 MG chewable tablet  Commonly known as:  TUMS - dosed in mg elemental calcium  Chew 1 tablet by mouth as needed for indigestion or heartburn.     DULoxetine 30 MG capsule  Commonly known as:  CYMBALTA  Take 30 mg by mouth daily.     glimepiride 4 MG tablet  Commonly known as:  AMARYL  Take  4 mg by mouth 2 (two) times daily. 1 tab AM, 1/2 tab PM     insulin glargine 100 UNIT/ML injection  Commonly known as:  LANTUS  Inject 10 Units into the skin at bedtime.     levothyroxine 88 MCG tablet  Commonly known as:  SYNTHROID, LEVOTHROID  Take 88 mcg by mouth every morning.     metoprolol succinate 25 MG 24 hr tablet  Commonly known as:  TOPROL-XL  Take 25 mg by mouth daily.     oxyCODONE-acetaminophen 10-325 MG tablet  Commonly known as:  PERCOCET  Take 1 tablet by mouth every 4 (four) hours as needed for pain.     sodium bicarbonate 650 MG tablet  Take 1 tablet (650 mg total) by mouth 2 (two) times daily.     telmisartan 80 MG tablet  Commonly known as:  MICARDIS  Take 80 mg by mouth daily.     traZODone 50 MG tablet  Commonly known as:  DESYREL  Take 50 mg by mouth at bedtime. As needed.         Florene Glen, MD, FACS

## 2015-07-22 NOTE — Progress Notes (Signed)
10 Days Post-Op  Subjective: Status post colostomy closure feels much better today and tolerating a diet. She thinks she is ready to go home.  Objective: Vital signs in last 24 hours: Temp:  [98 F (36.7 C)] 98 F (36.7 C) (06/03 0438) Pulse Rate:  [29-108] 83 (06/03 0438) Resp:  [18-20] 18 (06/03 0438) BP: (156-179)/(87-98) 179/89 mmHg (06/03 0438) SpO2:  [95 %-98 %] 95 % (06/03 0438) Last BM Date: 07/20/15  Intake/Output from previous day: 06/02 0701 - 06/03 0700 In: 600 [P.O.:600] Out: 0  Intake/Output this shift: Total I/O In: 360 [P.O.:360] Out: -   Physical exam:  Awake alert and oriented soft nontender abdomen wounds clean no erythema minimal drainage  Lab Results: CBC   Recent Labs  07/22/15 0556  WBC 8.4  HGB 8.9*  HCT 28.0*  PLT 315   BMET  Recent Labs  07/21/15 0511 07/22/15 0556  NA 138 138  K 4.5 4.2  CL 108 110  CO2 20* 20*  GLUCOSE 118* 115*  BUN 33* 33*  CREATININE 2.40* 2.33*  CALCIUM 7.6* 7.6*   PT/INR No results for input(s): LABPROT, INR in the last 72 hours. ABG No results for input(s): PHART, HCO3 in the last 72 hours.  Invalid input(s): PCO2, PO2  Studies/Results: No results found.  Anti-infectives: Anti-infectives    Start     Dose/Rate Route Frequency Ordered Stop   07/12/15 0110  cefoTEtan (CEFOTAN) 2 g in dextrose 5 % 50 mL IVPB     2 g 100 mL/hr over 30 Minutes Intravenous On call to O.R. 07/12/15 0110 07/12/15 0750      Assessment/Plan: s/p Procedure(s): COLOSTOMY REVERSAL EXPLORATORY LAPAROTOMY LAPAROSCOPY DIAGNOSTIC LYSIS OF ADHESION   Creatinine is back to 2.3 slight of 1.6 or 1.8. She is feeling well wants to go home soon. I believe she can be discharged later today and will follow up in our office in 10 days  Florene Glen, MD, FACS  07/22/2015

## 2015-07-24 ENCOUNTER — Encounter: Payer: Self-pay | Admitting: Surgery

## 2015-07-24 ENCOUNTER — Other Ambulatory Visit: Payer: Self-pay | Admitting: *Deleted

## 2015-07-24 ENCOUNTER — Telehealth: Payer: Self-pay | Admitting: General Surgery

## 2015-07-24 NOTE — Telephone Encounter (Signed)
Patient would like to know if she will be getting supplies and if a nurse would be coming out to help her?

## 2015-07-24 NOTE — Telephone Encounter (Signed)
Tim from [facility] called stating that they were needing an order stating that the patient would be needing supplies and wound care. Can you please fax this to Tim [phone]. Also, make sure to put how her wound needs to be cared for. Thanks.

## 2015-07-24 NOTE — Patient Outreach (Signed)
Transition of care call (week 1, discharged 6/3).  Pt reports being weak and sore since hospital discharge, tolerating a regular diet- eating a little at a time.    Pt reports her wound is not draining a lot, daughter keeps it clean, dry dressing. Pt reports she left a message with Dr. Reginal Lutes nurse  about getting dressing supplies and if nurse will be seeing her.  RN CM discussed with pt saw where [facility] PT was ordered to which pt reports has not heard from them yet.   Reviewing  with pt her discharge medications showed  pt did not have Amlodipine or Amiodarone (on discharge summary).  Pt reports when Dr. Reginal Lutes nurse called her back will ask for a rx of these two medications.   As discussed with pt, plan to f/u 6/6 - home visit.    Zara Chess.   [facility]  [phone]

## 2015-07-24 NOTE — Telephone Encounter (Signed)
Called patient back and told her that we would send her orders to [place] (Tim). I explained that they would be going to her home every other day. I also told her that she should call her cardiologist in reference to the two medications that were given to her on her discharge. Patient understood.  Order for [name] was faxed.

## 2015-07-25 ENCOUNTER — Telehealth: Payer: Self-pay | Admitting: General Surgery

## 2015-07-25 ENCOUNTER — Encounter: Payer: Self-pay | Admitting: *Deleted

## 2015-07-25 ENCOUNTER — Other Ambulatory Visit: Payer: Self-pay | Admitting: *Deleted

## 2015-07-25 NOTE — Telephone Encounter (Signed)
[name] at Home left a voice message that he needs verbal orders for patient

## 2015-07-25 NOTE — Telephone Encounter (Signed)
Called Merry Proud from [facility] and stated that he went to see Mrs. Lepera today. He stated that on two of her incisions, he saw that small parts were dehisced. He stated that he would take care of her wounds and if he had any trouble, that he would call us back. I told him that we had an appointment scheduled for [place] to see her on 08/01/2015. He stated that he would change her bandaging twice a week. I also told him that the order were faxed.

## 2015-07-25 NOTE — Patient Outreach (Signed)
[facility]) Care Management   07/25/2015  [name] 1951/01/08 [id number]  [name] is an 65 y.o. female  Subjective: Pt reports did f/u with Dr. Reginal Lutes nurse about Amiodarone and Amlodipine, told to take,  Prescriptions already called in, daughter to pick them up today.  Pt reports not feeling well today, weak, abdominal cramps, took pain medication- easing off.  Pt reports appetite not good today, had some crackers, did not take her medications yet.  Pt reports [facility] RN just left, changed abdominal dressings, to f/u again 6/9 with daughter doing remaining days.   Pt reports HH PT is also suppose to come today.    Objective:   Filed Vitals:   07/25/15 1313  BP: 158/100  Pulse: 68  Resp: 16    ROS  Physical Exam  Constitutional: She is oriented to person, place, and time. She appears well-developed and well-nourished.  Cardiovascular: Normal rate and normal heart sounds.   Respiratory: Effort normal and breath sounds normal.  GI: Soft. Bowel sounds are normal.  Musculoskeletal: Normal range of motion. She exhibits no edema.  Neurological: She is alert and oriented to person, place, and time.  Skin:  Dressings on abdomen intact.   Psychiatric: She has a normal mood and affect. Her behavior is normal. Judgment and thought content normal.    Encounter Medications:   Outpatient Encounter Prescriptions as of 07/25/2015  Medication Sig Note  . atorvastatin (LIPITOR) 40 MG tablet Take 40 mg by mouth at bedtime.   . calcium carbonate (TUMS - DOSED IN MG ELEMENTAL CALCIUM) 500 MG chewable tablet Chew 1 tablet by mouth as needed for indigestion or heartburn. 07/24/2015: As needed.   . DULoxetine (CYMBALTA) 30 MG capsule Take 30 mg by mouth daily.    Marland Kitchen glimepiride (AMARYL) 4 MG tablet Take 4 mg by mouth 2 (two) times daily. 1 tab AM, 1/2 tab PM 07/25/2015: Per pt, takes one tablet in am, 1/2 tablet at night.   . insulin glargine (LANTUS) 100 UNIT/ML injection Inject  10 Units into the skin at bedtime.   Marland Kitchen levothyroxine (SYNTHROID, LEVOTHROID) 88 MCG tablet Take 88 mcg by mouth every morning.    . metoprolol succinate (TOPROL-XL) 25 MG 24 hr tablet Take 25 mg by mouth daily.   Marland Kitchen oxyCODONE-acetaminophen (PERCOCET) 10-325 MG tablet Take 1 tablet by mouth every 4 (four) hours as needed for pain.   . sodium bicarbonate 650 MG tablet Take 1 tablet (650 mg total) by mouth 2 (two) times daily.   Marland Kitchen telmisartan (MICARDIS) 80 MG tablet Take 80 mg by mouth daily.   . traZODone (DESYREL) 50 MG tablet Take 50 mg by mouth at bedtime. Reported on 07/24/2015 07/24/2015: Pt reports ran out.   Marland Kitchen amiodarone (PACERONE) 100 MG tablet Take 1 tablet (100 mg total) by mouth 2 (two) times daily. (Patient not taking: Reported on 07/24/2015) 07/25/2015: Daughter to pick up rx today, plan to start   . amLODipine (NORVASC) 5 MG tablet Take 1 tablet (5 mg total) by mouth daily. (Patient not taking: Reported on 07/24/2015) 07/25/2015: Daughter to pick up rx today, then to start    No facility-administered encounter medications on file as of 07/25/2015.    Functional Status:   In your present state of health, do you have any difficulty performing the following activities: 07/12/2015 07/12/2015  Hearing? N N  Vision? Y Y  Difficulty concentrating or making decisions? N N  Walking or climbing stairs? Tempie Donning  Dressing  or bathing? N N  Doing errands, shopping? N -    Fall/Depression Screening:    PHQ 2/9 Scores 07/03/2015 05/02/2015  PHQ - 2 Score 0 0  Exception Documentation (No Data) -    Assessment:  Pleasant 65 year old woman, resides with daughter and her spouse.  Recent                          Hospital discharge- colostomy closure, abdominal dressings intact - HH RN and                            Daughter doing dressing changes.                          HTN-  BP today 158/100- no medications taken by pt yet but  had her  take during                               Home visit.  Daughter to pick up  Amlodipine and Amiodarone (new medications                              Post discharge), pt to start taking as ordered.                              Plan:  Pt to f/u with Dr. Adonis Huguenin 6/13.             Per pt to f/u with Dr. Caryl Comes next month.             Per pt to f/u with [facility] MD, to call office today to schedule            As discussed, pt to monitor BP, record with new BP medication added.             Plan to  Continue to follow pt for transition of care, f/u again telephonically 6/13.             Plan to send Dr. Caryl Comes quarterly update letter, today's home visit encounter.   [facility] CM Care Plan Problem One        Most Recent Value   Care Plan Problem One  elevated BP    Role Documenting the Problem One  Care Management Coordinator   Care Plan for Problem One  Active   [facility]N Long Term Goal (31-90 days)  Pt's BP would be within normal limits in the next 60 days    [facility]N Long Term Goal Start Date  05/15/15   [facility] Long Term Goal Met Date  -- [not met in time frame, hospitalized. ]   Interventions for Problem One Long Term Goal  Provided pt with BP machine, instructions given with pt demonstrating use.    [facility] CM Short Term Goal #3 (0-30 days)  Pt would monitor BP twice a week, record for the next 30 days    [facility] CM Short Term Goal #3 Start Date  06/15/15   [facility] CM Short Term Goal #3 Met Date  -- [not met in time frame- hospitalized ]    [facility] CM Care Plan Problem Two        Most Recent Value   Care Plan  Problem Two  Risk for readmission related to recent hospitalization - colostomy closure.    Role Documenting the Problem Two  Care Management [place] for Problem Two  Active   Interventions for Problem Two Long Term Goal   home visit done, reviewed medications    THN Long Term Goal (31-90) days  Pt would not readmit 31 days from day of discharge.    THN Long Term Goal Start Date  07/24/15    [facility] CM Care Plan Problem Three        Most Recent Value   Care Plan Problem Three   elevated BP     Role Documenting the Problem Three  Care Management Coordinator   Care Plan for Problem Three  Active   THN CM Short Term Goal #1 (0-30 days)  BP would be within norm in the next 30 days   [facility]N CM Short Term Goal #1 Start Date  07/25/15   Interventions for Short Term Goal #1  Reinforced with pt to pick up Amiodarone and Amlodipine today, start taking as ordered.  also discussed monitoring BP, compliance with low na+ diet.      Zara Chess.   [facility]  [phone]

## 2015-07-28 ENCOUNTER — Other Ambulatory Visit: Payer: Self-pay

## 2015-08-01 ENCOUNTER — Ambulatory Visit (INDEPENDENT_AMBULATORY_CARE_PROVIDER_SITE_OTHER): Payer: PPO | Admitting: General Surgery

## 2015-08-01 ENCOUNTER — Other Ambulatory Visit: Payer: Self-pay | Admitting: *Deleted

## 2015-08-01 ENCOUNTER — Encounter: Payer: Self-pay | Admitting: General Surgery

## 2015-08-01 ENCOUNTER — Ambulatory Visit: Payer: PPO | Admitting: *Deleted

## 2015-08-01 VITALS — BP 153/101 | HR 78 | Temp 100.0°F | Ht 63.0 in | Wt 203.0 lb

## 2015-08-01 DIAGNOSIS — Z4889 Encounter for other specified surgical aftercare: Secondary | ICD-10-CM

## 2015-08-01 MED ORDER — OXYCODONE-ACETAMINOPHEN 10-325 MG PO TABS: 1.0000 | ORAL_TABLET | ORAL | Status: DC | PRN

## 2015-08-01 NOTE — Progress Notes (Signed)
Outpatient Surgical Follow Up  08/01/2015  [name] is an 65 y.o. female.   Chief Complaint  Patient presents with  . Routine Post Op    Colostomy takedown- (Dr.Damica Gravlin-07/12/15)    HPI: 65 year old female returns to clinic 3 weeks status post open colostomy takedown. Patient's postoperative course was complicated by acute kidney injury and new onset A. fib. She has been home for the last 10 days. She states that she's had a gradual return of energy but she continues to be fatigued easily. She is having between 2 and 4 bowel movements a day. She is tolerating a diet without nausea or vomiting. She's had some subjective low-grade fevers at home but none above 100. She's been very happy with her surgical experience and continues to have home health helping with her midline wound.  Past Medical History  Diagnosis Date  . Diabetes mellitus without complication ([place])   . GI bleed   . Aortic valve stenosis   . Hyperlipidemia   . Hypertension   . Diverticulitis large intestine   . Obesity   . Shortness of breath dyspnea   . Anxiety   . Neuropathy in diabetes ([place])   . Anemia   . Vertigo   . Bowel obstruction ([place])   . Hypothyroidism   . Arthritis     feet, legs  . Colostomy in place [facility])   . MRSA (methicillin resistant Staphylococcus aureus)     at abdominal wound.  Jan 2017.  Treated.   Marland Kitchen Heart murmur   . CKD (chronic kidney disease)     protein in urine  . Ectopic atrial tachycardia ([place])   . Dysrhythmia   . Neuropathy ([place])   . Gastritis     Past Surgical History  Procedure Laterality Date  . Colectomy    . Laparotomy closure of cecal perforation  05/09/2013    Dr. Marina Gravel  . Tonsillectomy    . Back surgery      spur frmoved from lower back  . Cardiac catheterization    . Abdominal hysterectomy    . Debridement of abdominal wall abscess N/A 02/22/2015    Procedure: DEBRIDEMENT OF ABDOMINAL WALL ABSCESS;  Surgeon: Clayburn Pert, MD;  Location: [facility];  Service:  General;  Laterality: N/A;  . Excision mass abdominal N/A 02/24/2015    Procedure: EXCISION MASS ABDOMINAL  / [place];  Surgeon: Clayburn Pert, MD;  Location: [facility];  Service: General;  Laterality: N/A;  . Application of wound vac N/A 02/24/2015    Procedure: APPLICATION OF WOUND VAC;  Surgeon: Clayburn Pert, MD;  Location: [facility];  Service: General;  Laterality: N/A;  . Excision mass abdominal N/A 02/26/2015    Procedure: EXCISION MASS ABDOMINAL/wash out;  Surgeon: Clayburn Pert, MD;  Location: [facility];  Service: General;  Laterality: N/A;  . Application of wound vac N/A 02/26/2015    Procedure: APPLICATION OF WOUND VAC;  Surgeon: Clayburn Pert, MD;  Location: [facility];  Service: General;  Laterality: N/A;  . Wound debridement N/A 03/03/2015    Procedure: DEBRIDEMENT ABDOMINAL WOUND;  Surgeon: Florene Glen, MD;  Location: [facility];  Service: General;  Laterality: N/A;  . Wound debridement N/A 03/09/2015    Procedure: DEBRIDEMENT ABDOMINAL WOUND;  Surgeon: Florene Glen, MD;  Location: [facility];  Service: General;  Laterality: N/A;  . Flexible sigmoidoscopy  06/19/2015    Procedure: FLEXIBLE SIGMOIDOSCOPY;  Surgeon: Lucilla Lame, MD;  Location: [place];  Service: Endoscopy;;  UNABLE TO  ACCESS OSTOMY SITE FOR ACCESS INTO COLON  . Colostomy reversal N/A 07/12/2015    Procedure: COLOSTOMY REVERSAL;  Surgeon: Clayburn Pert, MD;  Location: [facility];  Service: General;  Laterality: N/A;  . Laparotomy  07/12/2015    Procedure: EXPLORATORY LAPAROTOMY;  Surgeon: Clayburn Pert, MD;  Location: [facility];  Service: General;;  . Laparoscopy  07/12/2015    Procedure: LAPAROSCOPY DIAGNOSTIC;  Surgeon: Clayburn Pert, MD;  Location: [facility];  Service: General;;  . Lysis of adhesion  07/12/2015    Procedure: LYSIS OF ADHESION;  Surgeon: Clayburn Pert, MD;  Location: [facility];  Service: General;;  . Laparotomy N/A 02/06/2015    Procedure: Laparotomy, reduction of incarcerated parastomal  hernia, repair of parastomal hernia with mesh;  Surgeon: Sherri Rad, MD;  Location: [facility];  Service: General;  Laterality: N/A;    Family History  Problem Relation Age of Onset  . Diabetes Mother   . Hypertension Father     Social History:  reports that she quit smoking about 2 years ago. Her smoking use included Cigarettes. She has a .25 pack-year smoking history. She has never used smokeless tobacco. She reports that she does not drink alcohol or use illicit drugs.  Allergies:  Allergies  Allergen Reactions  . Buspirone Other (See Comments)    Weakness  . Citalopram Other (See Comments)  . Lisinopril Cough  . Metformin Diarrhea    At 1000 mg dose  . Pravastatin Other (See Comments)    insomnia  . Sitagliptin Other (See Comments)    constipation  . Tramadol Itching  . Diltiazem Hcl Palpitations  . Gabapentin Palpitations  . Hydralazine Rash  . Lovastatin Palpitations    Medications reviewed.    ROS  A multipoint review of systems was completed. All pertinent positives and negatives are documented within the history of present illness remainder negative.  BP 153/101 mmHg  Pulse 78  Temp(Src) 100 F (37.8 C) (Oral)  Ht 5\' 3"  (1.6 m)  Wt 92.08 kg (203 lb)  BMI 35.97 kg/m2  Physical Exam Gen.: No acute distress Chest: Clear to auscultation Heart: Regular rhythm Abdomen: Large, soft, purple tender to palpation in the midline. Complex midline wound secondary to her body habitus with loss of skin coverage over the middle 4 cm. Fascial sutures visualized in the open wound that are intact. Prior ostomy site in the left upper quadrant without evidence of erythema or drainage but with a scab present on the most lateral 3 cm. Prior attempted laparoscopic surgery site in the right upper quadrant well approximated without evidence of erythema or drainage.    No results found for this or any previous visit (from the past 48 hour(s)). No results  found.  Assessment/Plan:  1. Aftercare following surgery 65 year old female 3 weeks status post open colostomy takedown. Doing well considering her postoperative course. All staples removed in clinic today and Steri-Strips placed over the areas where appropriate. She is to continue home wound care via home health and her daughter has been doing at least daily to twice daily wound changes very successfully. Discussed the signs and symptoms of infection and return to clinic medially should they occur otherwise she'll follow up in clinic in 2 weeks for additional wound check. Counseled the patient that this will be a process a multipoint many weeks to heal given her body habitus and the complex nature of the wound and she voiced understanding. We'll provide her with a additional prescription for pain medications today however discussed that  her need for this should continue to decrease as she heals.     Clayburn Pert, MD FACS General Surgeon  08/01/2015,3:57 PM

## 2015-08-01 NOTE — Patient Outreach (Signed)
Transition of care call successful (week 2, discharged 6/3).   Pt reports having a little pain, need to take medication.  Pt reports daughter changed abdominal dressing yesterday, said it had a little drainage,no s/s of infection.  Pt reports to f/u with surgeon today.  Pt reports appetite is getting there, having small meals.  Pt reports feels weak, otherwise doing good. RN CM discussed with pt importance nutrition plays with healing.  RN CM  discussed with pt coworker Corporate investment banker (covering for this RN CM) will f/u with her next week telephonically (part of ongoing transition of care).      Plan to provide update to [place]- covering this RN CM next week.   Zara Chess.   [employer] [facility]  [phone]

## 2015-08-01 NOTE — Patient Instructions (Signed)
Please see your appointment listed below.  Please call our office if you have any questions or concerns.

## 2015-08-07 ENCOUNTER — Other Ambulatory Visit: Payer: Self-pay | Admitting: Surgery

## 2015-08-07 NOTE — Telephone Encounter (Signed)
[facility] at this time to find out if patient has appointment for follow-up with their office. [phone].  Patient is requesting her Sodium Bicarb. After speaking with Dr. Azalee Course, she is ok with refilling this medication as long as patient has an appointment to follow-up with Nephrology.  Voicemail left for office staff at this time. Will await return phone call prior to refilling prescription.

## 2015-08-08 NOTE — Telephone Encounter (Addendum)
Appointment made with Dr. Holley Raring (Nephrology) at this time for follow-up on renal problems while hospitalized. Appointment is in the [place] office on Thursday 08/10/15 at 1130am. Patient will need to arrive 15 minutes early, have ID and Insurance Card, and copay for appointment.  Nephrology will refill this medication for patient is this continues to be needed.  Call made to patient at this time on home phone. No answer. Unable to leave voicemail on phone as it continued to ring. Will try back later.  Call made to patient's cell phone. Unable to leave voicemail due to restrictions.

## 2015-08-15 ENCOUNTER — Other Ambulatory Visit: Payer: Self-pay | Admitting: *Deleted

## 2015-08-15 NOTE — Patient Outreach (Signed)
Attempt made to  contact pt on her mobile phone- no voice message set up yet, unable to leave a voice message.   Transition of care call successful on pt's  home phone (pt discharged 6/3, recent hospitalization- colostomy closure).   Spoke with pt's daughter Hailee Keitt (on [facility] consent form), reports pt not available, is doing good.  Daughter reports pt's appetite is better, no nausea/vomiting, f/u with surgeon (6/13), received a good report.  Daughter reports pt's energy is doing a little better, HH RN is still coming twice a week for wound (abdominal) care, daughter does  wound care once a day, no s/s of infection noted.  Daughter reports pt's  BP medications were picked up,  BP running 150/80, sugars run low in am (70's), will go up in 100's during the day.  RN CM discussed with daughter  plan to f/u again with pt  telephonically  7/5- do  final transition of care call, discuss further case management needs.       Zara Chess.   [place] [facility]  [phone]

## 2015-08-17 ENCOUNTER — Ambulatory Visit (INDEPENDENT_AMBULATORY_CARE_PROVIDER_SITE_OTHER): Payer: PPO | Admitting: Surgery

## 2015-08-17 ENCOUNTER — Encounter: Payer: Self-pay | Admitting: Surgery

## 2015-08-17 VITALS — BP 147/87 | HR 72 | Temp 98.2°F | Wt 201.0 lb

## 2015-08-17 DIAGNOSIS — K9409 Other complications of colostomy: Secondary | ICD-10-CM

## 2015-08-17 DIAGNOSIS — K5732 Diverticulitis of large intestine without perforation or abscess without bleeding: Secondary | ICD-10-CM

## 2015-08-17 NOTE — Patient Instructions (Signed)
Your wounds look wonderful today! We will have you follow-up in clinic with Dr. Adonis Huguenin in 3 weeks.  You should be transitioned over to Tylenol or Ibuprofen for pain at this time. You may continue to see the Pain clinic for your Chronic pain that you experience.  Call if you have any questions or concerns.

## 2015-08-17 NOTE — Progress Notes (Signed)
65 yr old female is a colostomy takedown on May 24 with Dr. Adonis Huguenin. The patient has been doing well so has some soreness in her abdomen. She has been using the narcotic for this and hasn't tried any ibuprofen or Tylenol for this yet. Dr. Adonis Huguenin did talk to her about this last visit. Patient otherwise has been eating and drinking better is getting her appetite back. Patient states that she still has some weakness and gets tired easily however she states this is getting better over time. Patient is doing her dressing changes to her midline wound around the umbilical area twice a day and states that this area is getting smaller. Otherwise she is having good bowel movements 1-2 a day.  Filed Vitals:   08/17/15 0958  BP: 147/87  Pulse: 72  Temp: 98.2 F (36.8 C)   PE:  Gen: NAD  Abd: soft, non-tender, well healed LUQ scar, midline wound healing well with small 2cm around with some fibrinous material that is easily wiped away, silver nitrate applied to a superior area of hypergranulation tissue   A/P:  Patient seems to be healing well after colostomy takedown. Patient is to continue her dressing changes twice a day. Patient is to transition over to ibuprofen and Tylenol for pain and off of the narcotic medicine. Patient will follow-up with Dr. Adonis Huguenin in 3 weeks for further wound check

## 2015-08-23 ENCOUNTER — Encounter: Payer: Self-pay | Admitting: *Deleted

## 2015-08-23 ENCOUNTER — Other Ambulatory Visit: Payer: Self-pay | Admitting: *Deleted

## 2015-08-23 NOTE — Patient Outreach (Signed)
Final transition of care call successful (recent in patient stay 5/24-6/3 ostomy closure).  Spoke with pt, HIPAA verified.  Pt reports f/u with surgeon last week, was told wound looks great, HH RN coming twice a week.  Pt reports to f/u with surgeon again in 2 weeks.  Pt reports daughter continues to change dressings daily.  Pt reports appetite is good, weakness improving, bowels moving.  Pt reports BP is okay, HH RN checked it yesterday, result 140/80.  Pt reports sugar today was 90, been 70 in am- will have orange juice or eat something sweet followed by breakfast.  RN CM discussed with pt plan to close case, no further case management needs.     Plan to fax Dr. Caryl Comes case closure letter. As discussed with pt, case closure letter to be sent which includes [facility] main office number to call if case  Management needs arise in the future.   Plan to inform [facility] care management assistant to close case.   Zara Chess.   [place] [facility]  [phone]

## 2015-08-25 ENCOUNTER — Other Ambulatory Visit: Payer: Self-pay | Admitting: Surgery

## 2015-09-05 ENCOUNTER — Encounter: Payer: Self-pay | Admitting: General Surgery

## 2015-09-06 ENCOUNTER — Encounter: Payer: Self-pay | Admitting: General Surgery

## 2015-09-06 ENCOUNTER — Ambulatory Visit
Admission: RE | Admit: 2015-09-06 | Discharge: 2015-09-06 | Disposition: A | Discharge status: Home / Self Care | Payer: PPO | Source: Ambulatory Visit | Attending: Pain Medicine | Admitting: Pain Medicine

## 2015-09-06 ENCOUNTER — Ambulatory Visit (INDEPENDENT_AMBULATORY_CARE_PROVIDER_SITE_OTHER): Payer: PPO | Admitting: General Surgery

## 2015-09-06 VITALS — BP 155/82 | HR 72 | Temp 98.7°F | Ht 63.0 in | Wt 196.0 lb

## 2015-09-06 DIAGNOSIS — M1288 Other specific arthropathies, not elsewhere classified, other specified site: Secondary | ICD-10-CM | POA: Insufficient documentation

## 2015-09-06 DIAGNOSIS — G8929 Other chronic pain: Secondary | ICD-10-CM

## 2015-09-06 DIAGNOSIS — Z9889 Other specified postprocedural states: Secondary | ICD-10-CM | POA: Diagnosis not present

## 2015-09-06 DIAGNOSIS — M25559 Pain in unspecified hip: Secondary | ICD-10-CM | POA: Diagnosis present

## 2015-09-06 DIAGNOSIS — M542 Cervicalgia: Principal | ICD-10-CM

## 2015-09-06 DIAGNOSIS — Z4889 Encounter for other specified surgical aftercare: Secondary | ICD-10-CM

## 2015-09-06 DIAGNOSIS — M25561 Pain in right knee: Secondary | ICD-10-CM | POA: Diagnosis not present

## 2015-09-06 DIAGNOSIS — E1140 Type 2 diabetes mellitus with diabetic neuropathy, unspecified: Secondary | ICD-10-CM | POA: Insufficient documentation

## 2015-09-06 DIAGNOSIS — M8580 Other specified disorders of bone density and structure, unspecified site: Secondary | ICD-10-CM | POA: Insufficient documentation

## 2015-09-06 DIAGNOSIS — M25562 Pain in left knee: Secondary | ICD-10-CM | POA: Diagnosis not present

## 2015-09-06 DIAGNOSIS — I700 Atherosclerosis of aorta: Secondary | ICD-10-CM | POA: Diagnosis not present

## 2015-09-06 DIAGNOSIS — M545 Low back pain: Secondary | ICD-10-CM

## 2015-09-06 DIAGNOSIS — M47892 Other spondylosis, cervical region: Secondary | ICD-10-CM | POA: Insufficient documentation

## 2015-09-06 NOTE — Patient Instructions (Signed)
Please call our office if you have any questions or concerns.  

## 2015-09-06 NOTE — Progress Notes (Signed)
Outpatient Surgical Follow Up  09/06/2015  [name] is an 65 y.o. female.   Chief Complaint  Patient presents with  . Routine Post Op    Colostomy Takedown - 07/12/15-Dr.Marx Doig    HPI: 65 year old female returns to clinic for additional follow-up after colostomy takedown approximately 6 weeks ago. Patient reports that her pain is well-controlled and she is been established by the pain clinic who is providing her pain medications. She's been eating well and having normal bowel function. She continues to have home wound care and states that it is improving mildly. She is having a little bit of drainage from her midline but this is also improving. Her prior ostomy site has healed well and she's been very happy with her surgical drainage. She denies any fevers, chills, nausea, vomiting, chest pain, shortness of breath, diarrhea, constipation.  Past Medical History  Diagnosis Date  . Diabetes mellitus without complication ([place])   . GI bleed   . Aortic valve stenosis   . Hyperlipidemia   . Hypertension   . Diverticulitis large intestine   . Obesity   . Shortness of breath dyspnea   . Anxiety   . Neuropathy in diabetes ([place])   . Anemia   . Vertigo   . Bowel obstruction ([place])   . Hypothyroidism   . Arthritis     feet, legs  . Colostomy in place [facility])   . MRSA (methicillin resistant Staphylococcus aureus)     at abdominal wound.  Jan 2017.  Treated.   Marland Kitchen Heart murmur   . CKD (chronic kidney disease)     protein in urine  . Ectopic atrial tachycardia ([place])   . Dysrhythmia   . Neuropathy ([place])   . Gastritis     Past Surgical History  Procedure Laterality Date  . Colectomy    . Laparotomy closure of cecal perforation  05/09/2013    Dr. Marina Gravel  . Tonsillectomy    . Back surgery      spur frmoved from lower back  . Cardiac catheterization    . Abdominal hysterectomy    . Debridement of abdominal wall abscess N/A 02/22/2015    Procedure: DEBRIDEMENT OF ABDOMINAL WALL ABSCESS;   Surgeon: Clayburn Pert, MD;  Location: [facility];  Service: General;  Laterality: N/A;  . Excision mass abdominal N/A 02/24/2015    Procedure: EXCISION MASS ABDOMINAL  / [place];  Surgeon: Clayburn Pert, MD;  Location: [facility];  Service: General;  Laterality: N/A;  . Application of wound vac N/A 02/24/2015    Procedure: APPLICATION OF WOUND VAC;  Surgeon: Clayburn Pert, MD;  Location: [facility];  Service: General;  Laterality: N/A;  . Excision mass abdominal N/A 02/26/2015    Procedure: EXCISION MASS ABDOMINAL/wash out;  Surgeon: Clayburn Pert, MD;  Location: [facility];  Service: General;  Laterality: N/A;  . Application of wound vac N/A 02/26/2015    Procedure: APPLICATION OF WOUND VAC;  Surgeon: Clayburn Pert, MD;  Location: [facility];  Service: General;  Laterality: N/A;  . Wound debridement N/A 03/03/2015    Procedure: DEBRIDEMENT ABDOMINAL WOUND;  Surgeon: Florene Glen, MD;  Location: [facility];  Service: General;  Laterality: N/A;  . Wound debridement N/A 03/09/2015    Procedure: DEBRIDEMENT ABDOMINAL WOUND;  Surgeon: Florene Glen, MD;  Location: [facility];  Service: General;  Laterality: N/A;  . Flexible sigmoidoscopy  06/19/2015    Procedure: FLEXIBLE SIGMOIDOSCOPY;  Surgeon: Lucilla Lame, MD;  Location: [place];  Service:  Endoscopy;;  UNABLE TO ACCESS OSTOMY SITE FOR ACCESS INTO COLON  . Colostomy reversal N/A 07/12/2015    Procedure: COLOSTOMY REVERSAL;  Surgeon: Clayburn Pert, MD;  Location: [facility];  Service: General;  Laterality: N/A;  . Laparotomy  07/12/2015    Procedure: EXPLORATORY LAPAROTOMY;  Surgeon: Clayburn Pert, MD;  Location: [facility];  Service: General;;  . Laparoscopy  07/12/2015    Procedure: LAPAROSCOPY DIAGNOSTIC;  Surgeon: Clayburn Pert, MD;  Location: [facility];  Service: General;;  . Lysis of adhesion  07/12/2015    Procedure: LYSIS OF ADHESION;  Surgeon: Clayburn Pert, MD;  Location: [facility];  Service: General;;  . Laparotomy N/A 02/06/2015     Procedure: Laparotomy, reduction of incarcerated parastomal hernia, repair of parastomal hernia with mesh;  Surgeon: Sherri Rad, MD;  Location: [facility];  Service: General;  Laterality: N/A;    Family History  Problem Relation Age of Onset  . Diabetes Mother   . Hypertension Father     Social History:  reports that she quit smoking about 2 years ago. Her smoking use included Cigarettes. She has a .25 pack-year smoking history. She has never used smokeless tobacco. She reports that she does not drink alcohol or use illicit drugs.  Allergies:  Allergies  Allergen Reactions  . Buspirone Other (See Comments)    Weakness  . Citalopram Other (See Comments)  . Lisinopril Cough  . Metformin Diarrhea    At 1000 mg dose  . Pravastatin Other (See Comments)    insomnia  . Sitagliptin Other (See Comments)    constipation  . Tramadol Itching  . Diltiazem Hcl Palpitations  . Gabapentin Palpitations  . Hydralazine Rash  . Lovastatin Palpitations    Medications reviewed.    ROS A multipoint review of systems was completed. All pertinent positives and negatives are documented within the history of present illness and remainder are negative.   BP 155/82 mmHg  Pulse 72  Temp(Src) 98.7 F (37.1 C) (Oral)  Ht 5\' 3"  (1.6 m)  Wt 88.905 kg (196 lb)  BMI 34.73 kg/m2  Physical Exam Gen.: No acute distress Chest: Clear to auscultation Heart: Regular rate and rhythm Abdomen: Soft, nontender, nondistended. Midline incision with multiple areas of skin opening that is much improved. Good granulation tissue present at the umbilical site without any evidence of erythema or purulence. 2 pinpoint openings of clear drainage in the upper midline also without erythema or purulent drainage. Prior ostomy site well-healed completely without any sign of wound breakdown.    No results found for this or any previous visit (from the past 48 hour(s)). No results found.  Assessment/Plan:  1. Aftercare  following surgery 65 year old female doing well after an open ostomy takedown and reversal. Continues to have 2 areas of slow wound healing but these are progressing nicely. Discussed continuing local wound care. The 2 pinpoint areas of drainage were treated with silver nitrate in clinic today. Discussed that this would have a dark drainage over the next several days. She will follow-up in clinic in 3 weeks for additional wound check or sooner should she have any fear of infection. She is to continue follow-up with the pain clinic for her pain medication needs.     Clayburn Pert, MD FACS General Surgeon  09/06/2015,11:27 AM

## 2015-09-19 ENCOUNTER — Other Ambulatory Visit: Payer: Self-pay | Admitting: Surgery

## 2015-09-19 DIAGNOSIS — F3342 Major depressive disorder, recurrent, in full remission: Secondary | ICD-10-CM | POA: Insufficient documentation

## 2015-09-21 ENCOUNTER — Ambulatory Visit (HOSPITAL_BASED_OUTPATIENT_CLINIC_OR_DEPARTMENT_OTHER): Payer: PPO | Admitting: Pain Medicine

## 2015-09-21 DIAGNOSIS — G8929 Other chronic pain: Secondary | ICD-10-CM

## 2015-09-25 ENCOUNTER — Encounter: Payer: Self-pay | Admitting: Pain Medicine

## 2015-09-25 ENCOUNTER — Encounter (INDEPENDENT_AMBULATORY_CARE_PROVIDER_SITE_OTHER): Payer: Self-pay

## 2015-09-25 ENCOUNTER — Ambulatory Visit: Payer: PPO | Attending: Pain Medicine | Admitting: Pain Medicine

## 2015-09-25 VITALS — BP 154/81 | HR 64 | Temp 98.6°F | Resp 16 | Ht 63.0 in | Wt 201.0 lb

## 2015-09-25 DIAGNOSIS — M25562 Pain in left knee: Secondary | ICD-10-CM | POA: Diagnosis not present

## 2015-09-25 DIAGNOSIS — I471 Supraventricular tachycardia: Secondary | ICD-10-CM | POA: Insufficient documentation

## 2015-09-25 DIAGNOSIS — K5660 Unspecified intestinal obstruction: Secondary | ICD-10-CM | POA: Insufficient documentation

## 2015-09-25 DIAGNOSIS — I350 Nonrheumatic aortic (valve) stenosis: Secondary | ICD-10-CM | POA: Insufficient documentation

## 2015-09-25 DIAGNOSIS — Z9049 Acquired absence of other specified parts of digestive tract: Secondary | ICD-10-CM | POA: Insufficient documentation

## 2015-09-25 DIAGNOSIS — M545 Low back pain: Secondary | ICD-10-CM | POA: Insufficient documentation

## 2015-09-25 DIAGNOSIS — E034 Atrophy of thyroid (acquired): Secondary | ICD-10-CM | POA: Insufficient documentation

## 2015-09-25 DIAGNOSIS — N183 Chronic kidney disease, stage 3 (moderate): Secondary | ICD-10-CM | POA: Diagnosis not present

## 2015-09-25 DIAGNOSIS — M2578 Osteophyte, vertebrae: Secondary | ICD-10-CM | POA: Insufficient documentation

## 2015-09-25 DIAGNOSIS — G8929 Other chronic pain: Secondary | ICD-10-CM | POA: Insufficient documentation

## 2015-09-25 DIAGNOSIS — E1122 Type 2 diabetes mellitus with diabetic chronic kidney disease: Secondary | ICD-10-CM | POA: Diagnosis not present

## 2015-09-25 DIAGNOSIS — F3342 Major depressive disorder, recurrent, in full remission: Secondary | ICD-10-CM | POA: Insufficient documentation

## 2015-09-25 DIAGNOSIS — M1990 Unspecified osteoarthritis, unspecified site: Secondary | ICD-10-CM | POA: Insufficient documentation

## 2015-09-25 DIAGNOSIS — Z933 Colostomy status: Secondary | ICD-10-CM | POA: Insufficient documentation

## 2015-09-25 DIAGNOSIS — M25551 Pain in right hip: Secondary | ICD-10-CM | POA: Diagnosis present

## 2015-09-25 DIAGNOSIS — I1310 Hypertensive heart and chronic kidney disease without heart failure, with stage 1 through stage 4 chronic kidney disease, or unspecified chronic kidney disease: Secondary | ICD-10-CM | POA: Diagnosis not present

## 2015-09-25 DIAGNOSIS — Z79891 Long term (current) use of opiate analgesic: Secondary | ICD-10-CM

## 2015-09-25 DIAGNOSIS — M47816 Spondylosis without myelopathy or radiculopathy, lumbar region: Secondary | ICD-10-CM | POA: Insufficient documentation

## 2015-09-25 DIAGNOSIS — E538 Deficiency of other specified B group vitamins: Secondary | ICD-10-CM | POA: Diagnosis not present

## 2015-09-25 DIAGNOSIS — Z87891 Personal history of nicotine dependence: Secondary | ICD-10-CM | POA: Insufficient documentation

## 2015-09-25 DIAGNOSIS — D649 Anemia, unspecified: Secondary | ICD-10-CM | POA: Insufficient documentation

## 2015-09-25 DIAGNOSIS — E1140 Type 2 diabetes mellitus with diabetic neuropathy, unspecified: Secondary | ICD-10-CM | POA: Insufficient documentation

## 2015-09-25 DIAGNOSIS — M791 Myalgia: Secondary | ICD-10-CM | POA: Diagnosis not present

## 2015-09-25 DIAGNOSIS — M25561 Pain in right knee: Secondary | ICD-10-CM | POA: Insufficient documentation

## 2015-09-25 DIAGNOSIS — Z981 Arthrodesis status: Secondary | ICD-10-CM | POA: Insufficient documentation

## 2015-09-25 DIAGNOSIS — K5732 Diverticulitis of large intestine without perforation or abscess without bleeding: Secondary | ICD-10-CM | POA: Diagnosis not present

## 2015-09-25 DIAGNOSIS — M170 Bilateral primary osteoarthritis of knee: Secondary | ICD-10-CM | POA: Insufficient documentation

## 2015-09-25 DIAGNOSIS — L409 Psoriasis, unspecified: Secondary | ICD-10-CM | POA: Diagnosis not present

## 2015-09-25 DIAGNOSIS — R809 Proteinuria, unspecified: Secondary | ICD-10-CM | POA: Diagnosis not present

## 2015-09-25 DIAGNOSIS — R109 Unspecified abdominal pain: Secondary | ICD-10-CM | POA: Insufficient documentation

## 2015-09-25 DIAGNOSIS — M25552 Pain in left hip: Secondary | ICD-10-CM | POA: Diagnosis present

## 2015-09-25 DIAGNOSIS — I700 Atherosclerosis of aorta: Secondary | ICD-10-CM | POA: Insufficient documentation

## 2015-09-25 MED ORDER — OXYCODONE HCL 5 MG PO TABS: 5.0000 mg | ORAL_TABLET | Freq: Three times a day (TID) | ORAL | 0 refills | Status: DC | PRN

## 2015-09-25 NOTE — Progress Notes (Signed)
Patient's Name: [name]  Patient type: Established  MRN: [id number]  Service setting: Ambulatory outpatient  DOB: 11-07-1950  Location: [facility]  DOS: 09/25/2015  Primary Care Physician: Tama High III, MD  Note by: Kathlen Brunswick. Dossie Arbour, M.D, DABA, Sarita Haver, DABPM, Milagros Evener, FIPP  Referring Physician: Adin Hector, MD  Specialty: Board-Certified Interventional Pain Management  Last Visit to Pain Management: 07/03/2015   Primary Reason(s) for Visit: Encounter for evaluation before starting new chronic pain management plan of care (Level of risk: moderate) CC: Back Pain (lower); Hip Pain (bilateral); Shoulder Pain (bilateral); Abdominal Pain; and Leg Pain (bilateral)   HPI  [name] is a 65 y.o. year old, female patient, who returns today as an established patient. She has Diverticulitis of colon; Obesity; Small bowel obstruction ([place]); Absolute anemia; Aortic heart valve narrowing; Arthritis; B12 deficiency; Benign essential HTN; Chronic kidney disease (CKD), stage III (moderate); Diverticulitis; Focal and segmental hyalinosis; Bleeding gastrointestinal; Acquired atrophy of thyroid; Abnormal presence of protein in urine; Psoriasis; Paroxysmal supraventricular tachycardia ([place]); Breathlessness on exertion; Type 2 diabetes mellitus ([place]); Colostomy, retracted ([place]); Preop examination; Chronic pain; Chronic abdominal pain (Location of Primary Source of Pain) (Bilateral) (L>R); Long term current use of opiate analgesic; Long term prescription opiate use; Opiate use; Encounter for therapeutic drug level monitoring; Encounter for pain management planning; Neurogenic pain; Musculoskeletal pain; Visceral abdominal pain; Diabetic peripheral neuropathy ([place]); Chronic low back pain (Location of Secondary source of pain) (Bilateral) (L>R); Chronic lower extremity pain (Location of Tertiary source of pain) (Bilateral) (R>L); Chronic hip pain (Bilateral) (L>R); Chronic knee pain  (Bilateral) (L>R); Chronic neck pain (Bilateral) (L>R); H/O colostomy; Diabetic neuropathy ([place]); Recurrent major depressive disorder, in full remission ([place]); Lumbar spondylosis; and Facet syndrome, 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 Her primarily concern today is the Back Pain (lower); Hip Pain (bilateral); Shoulder Pain (bilateral); Abdominal Pain; and Leg Pain (bilateral)   Pain Assessment: Self-Reported Pain Score: 8  Clinically the patient looks like a 2/10 Reported level is inconsistent with clinical obrservations Information on the proper use of the pain score provided to the patient today. Pain Type: Chronic pain Pain Location: Back Pain Orientation: Lower Pain Descriptors / Indicators: Aching Pain Frequency: Constant  The patient comes into the clinics today for post-procedure evaluation on the interventional treatment done on 07/03/2015. In addition, she comes in today for pharmacological management of her chronic pain.  The patient  reports that she does not use drugs.  Date of Last Visit: 07/03/15 Service Provided on Last Visit: Evaluation (new patient)  Controlled Substance Pharmacotherapy Assessment & REMS (Risk Evaluation and Mitigation Strategy)  Analgesic: Oxycodone IR 5 mg every 8 hours (15 mg/day) MME/day: 60 mg/day Pill Count: Did not bring any of her pills.Patient informed that this is a requirement and final warning given. Pharmacokinetics: Onset of action (Liberation/Absorption): Within expected pharmacological parameters Time to Peak effect (Distribution): Timing and results are as within normal expected parameters Duration of action (Metabolism/Excretion): Within normal limits for medication Pharmacodynamics: Analgesic Effect: More than 50% Activity Facilitation: Medication(s) allow patient to sit, stand, walk, and do the basic ADLs Perceived Effectiveness: Described as relatively effective, allowing for increase in activities of daily living  (ADL) Side-effects or Adverse reactions: None reported Monitoring: [place] PMP: Online review of the past 94-month period conducted. Compliant with practice rules and regulations Last UDS on record: Summary  Date Value Ref Range Status  07/03/2015 FINAL  Final    Comment:    ====================================================================  TOXAS[facility]RE COMP DRUG ANALYSIS,UR ==================================================================== Test                             Result       Flag       Units Drug Present and Declared for Prescription Verification   Duloxetine                     PRESENT      EXPECTED   Trazodone                      PRESENT      EXPECTED   1,3 chlorophenyl piperazine    PRESENT      EXPECTED    1,3-chlorophenyl piperazine is an expected metabolite of    trazodone.   Metoprolol                     PRESENT      EXPECTED Drug Present not Declared for Prescription Verification   Hydrocodone                    255          UNEXPECTED ng/mg creat   Hydromorphone                  197          UNEXPECTED ng/mg creat   Dihydrocodeine                 102          UNEXPECTED ng/mg creat   Norhydrocodone                 153          UNEXPECTED ng/mg creat    Sources of hydrocodone include scheduled prescription    medications. Hydromorphone, dihydrocodeine and norhydrocodone are    expected metabolites of hydrocodone. Hydromorphone and    dihydrocodeine are also available as scheduled prescription    medications. ==================================================================== Test                      Result    Flag   Units      Ref Range   Creatinine              58               mg/dL      >=20 ==================================================================== Declared Medications:  The flagging and interpretation on this report are based on the  following declared medications.  Unexpected results may arise from  inaccuracies in the declared medications.   **Note: The testing scope of this panel includes these medications:  Duloxetine (Cymbalta)  Metoprolol (Toprol)  Trazodone (Desyrel)  **Note: The testing scope of this panel does not include following  reported medications:  Atorvastatin (Lipitor)  Glimepiride (Amaryl)  Insulin (Lantus)  Levothyroxine  Telmisartan (Micardis) ==================================================================== For clinical consultation, please call [phone]. ====================================================================    UDS interpretation: Unexpected findings: The patient had informed us that she was taking oxycodone instead of hydrocodone. However a review of the PMP reveals that she did get a prescription for hydrocodone November of last year. I am assuming that she ran out of the oxycodone and took some of the hydrocodone. Because of this reason today we have instructed the patient to bring all of her medications that she has at home so that  they can be inspected and those that are outdated will be disposed and those that can interact with my opioids will also be disposed of in front of her. She understood and accepted. Medication Assessment Form: Reviewed. Patient indicates being compliant with therapy Treatment compliance: Not applicable yet Risk Assessment: Aberrant Behavior: None observed today Substance Use Disorder (SUD) Risk Level: Moderate-to-high Risk of opioid abuse or dependence: 0.7-3.0% with doses ? 36 MME/day and 6.1-26% with doses ? 120 MME/day. Opioid Risk Tool (ORT) Score: Total Score: 4 Moderate Risk for SUD (Score between 4-7) Depression Scale Score: PHQ-2: PHQ-2 Total Score: 0 No depression (0) PHQ-9: PHQ-9 Total Score: 0 No depression (0-4)  Pharmacologic Plan: Today we may be taking over the patient's pharmacological regimen. See below  Previous Illicit Drug Screen Labs(s): No results found for: MDMA, COCAINSCRNUR, PCPSCRNUR, [facility]  Laboratory Chemistry   Inflammation Markers Lab Results  Component Value Date   ESRSEDRATE 46 (H) 05/04/2013    Renal Function Lab Results  Component Value Date   BUN 33 (H) 07/22/2015   CREATININE 2.33 (H) 07/22/2015   GFRAA 24 (L) 07/22/2015   GFRNONAA 21 (L) 07/22/2015    Hepatic Function Lab Results  Component Value Date   AST 14 (L) 02/22/2015   ALT 13 (L) 02/22/2015   ALBUMIN 2.5 (L) 07/21/2015    Electrolytes Lab Results  Component Value Date   NA 138 07/22/2015   K 4.2 07/22/2015   CL 110 07/22/2015   CALCIUM 7.6 (L) 07/22/2015   MG 1.8 07/19/2015    Pain Modulating Vitamins No results found for: Marveen Reeks, [id number], [id number], 25OHVITD1, 25OHVITD2, 25OHVITD3, VITAMINB12  Coagulation Parameters Lab Results  Component Value Date   INR 1.28 02/22/2015   LABPROT 16.1 (H) 02/22/2015   APTT 36 02/22/2015   PLT 315 07/22/2015    Cardiovascular Lab Results  Component Value Date   HGB 8.9 (L) 07/22/2015   HCT 28.0 (L) 07/22/2015    Note: Lab results reviewed.  Recent Diagnostic Imaging  Dg Cervical Spine Complete  Result Date: 09/06/2015 CLINICAL DATA:  Bilateral neck pain, chronic. Previous cervical fusion. EXAM: CERVICAL SPINE - COMPLETE 4+ VIEW COMPARISON:  None available FINDINGS: previous anterior cervical fusion at C4-5 and C5-6. There is solid bony fusion at both levels. Normal alignment. Degenerative changes and spondylosis noted at C3-4 and C6-7 with disc space narrowing, sclerosis and anterior osteophytes. Normal prevertebral soft tissues. Facets are aligned but demonstrate diffuse arthropathy. Foramina are patent. Trachea is midline. Lung apices are clear. Odontoid is intact IMPRESSION: Previous anterior cervical fusion spanning C4-C6 with solid fusion at both levels. Degenerative changes and spondylosis at C3-4 and C6-7 as well as diffuse facet arthropathy. No acute finding by plain radiography Electronically Signed   By: Jerilynn Mages.  Shick M.D.   On: 09/06/2015  13:46   Dg Lumbar Spine Complete W/bend  Result Date: 09/06/2015 CLINICAL DATA:  Chronic lower lumbar back pain EXAM: LUMBAR SPINE - COMPLETE WITH BENDING VIEWS COMPARISON:  02/21/2015 CT sagittal reconstructions FINDINGS: Bones are osteopenic. Advanced facet arthropathy at L3 did S1. There is minimal anterior slippage of L4 upon L5 measuring 5 mm secondary to posterior facet arthropathy. Preserved vertebral body heights and disc spaces. No acute compression fracture, wedge-shaped deformity or focal kyphosis. No instability with flexion and extension. Normal appearing pedicles. Mild bilateral SI joint arthropathy. No pars defects. Large volume of retained stool throughout the colon compatible with constipation. Prior cholecystectomy noted. Aortic atherosclerosis present. IMPRESSION: Advanced lumbar facet arthropathy from L3-S1  with associated L4 on L5 minimal anterior slippage. Osteopenia No acute compression fracture Aortic atherosclerosis Large volume of colonic stool, suggest constipation Electronically Signed   By: Jerilynn Mages.  Shick M.D.   On: 09/06/2015 13:48   Dg Knee 1-2 Views Left  Result Date: 09/06/2015 CLINICAL DATA:  History of osteoarthritis, bilateral knee pain anteriorly EXAM: LEFT KNEE - 1-2 VIEW COMPARISON:  09/06/2015 FINDINGS: Normal alignment without acute osseous finding, fracture or effusion. Preserved joint spaces. No significant arthropathy. Soft tissue calcifications present along the fibula head. These are nonspecific. IMPRESSION: No acute osseous finding or significant arthropathy. Electronically Signed   By: Jerilynn Mages.  Shick M.D.   On: 09/06/2015 13:52   Dg Knee 1-2 Views Right  Result Date: 09/06/2015 CLINICAL DATA:  Anterior knee pain EXAM: RIGHT KNEE - 1-2 VIEW COMPARISON:  09/06/2015 FINDINGS: Normal right knee alignment without acute fracture or effusion. Mild osteoarthritis of the medial and lateral compartments with minor joint space loss and bony spurring. Two small ossified loose  joint bodies present in the midline posteriorly projecting over the tibial spines on the frontal view. On the lateral view, there is advanced degenerative arthritis of the patellofemoral joint with joint space loss, sclerosis and bony spurring. IMPRESSION: Advanced right knee patellofemoral osteoarthritis. No acute osseous finding, fracture or effusion. Electronically Signed   By: Jerilynn Mages.  Shick M.D.   On: 09/06/2015 13:54   Dg Hip Unilat W Or W/o Pelvis 2-3 Views Left  Result Date: 09/06/2015 CLINICAL DATA:  Acute left hip pain, history of arthritis EXAM: DG HIP (WITH OR WITHOUT PELVIS) 2-3V LEFT COMPARISON:  09/06/2015 FINDINGS: Bones appear osteopenic. Left hip is intact without fracture or malalignment. No subluxation or dislocation. No significant arthropathy or degenerative process. IMPRESSION: No acute finding of the left hip. Electronically Signed   By: Jerilynn Mages.  Shick M.D.   On: 09/06/2015 13:49   Dg Hip Unilat W Or W/o Pelvis 2-3 Views Right  Result Date: 09/06/2015 CLINICAL DATA:  Bilateral hip pain, history of arthritis. Hip pain is worse on the right. EXAM: DG HIP (WITH OR WITHOUT PELVIS) 2-3V RIGHT COMPARISON:  09/06/2015 FINDINGS: Bony pelvis and hips appear symmetric and intact. Specifically, the right hip demonstrates normal alignment without acute osseous finding or fracture. No significant arthropathy. Mild sclerosis of the SI joints and symphysis pubis. Lower lumbar facet arthropathy again noted. Benign vascular pelvic calcifications. IMPRESSION: Osteopenia without acute process. Electronically Signed   By: Jerilynn Mages.  Shick M.D.   On: 09/06/2015 13:51   Cervical Imaging: Cervical DG complete:  Results for orders placed during the hospital encounter of 09/06/15  DG Cervical Spine Complete   Narrative CLINICAL DATA:  Bilateral neck pain, chronic. Previous cervical fusion.  EXAM: CERVICAL SPINE - COMPLETE 4+ VIEW  COMPARISON:  None available  FINDINGS: previous anterior cervical fusion at C4-5  and C5-6. There is solid bony fusion at both levels. Normal alignment. Degenerative changes and spondylosis noted at C3-4 and C6-7 with disc space narrowing, sclerosis and anterior osteophytes. Normal prevertebral soft tissues. Facets are aligned but demonstrate diffuse arthropathy. Foramina are patent. Trachea is midline. Lung apices are clear. Odontoid is intact  IMPRESSION: Previous anterior cervical fusion spanning C4-C6 with solid fusion at both levels.  Degenerative changes and spondylosis at C3-4 and C6-7 as well as diffuse facet arthropathy.  No acute finding by plain radiography   Electronically Signed   By: Jerilynn Mages.  Shick M.D.   On: 09/06/2015 13:46    Lumbosacral Imaging: Lumbar DG Bending views:  Results for  orders placed during the hospital encounter of 09/06/15  DG Lumbar Spine Complete W/Bend   Narrative CLINICAL DATA:  Chronic lower lumbar back pain  EXAM: LUMBAR SPINE - COMPLETE WITH BENDING VIEWS  COMPARISON:  02/21/2015 CT sagittal reconstructions  FINDINGS: Bones are osteopenic. Advanced facet arthropathy at L3 did S1. There is minimal anterior slippage of L4 upon L5 measuring 5 mm secondary to posterior facet arthropathy. Preserved vertebral body heights and disc spaces. No acute compression fracture, wedge-shaped deformity or focal kyphosis. No instability with flexion and extension. Normal appearing pedicles. Mild bilateral SI joint arthropathy. No pars defects. Large volume of retained stool throughout the colon compatible with constipation. Prior cholecystectomy noted. Aortic atherosclerosis present.  IMPRESSION: Advanced lumbar facet arthropathy from L3-S1 with associated L4 on L5 minimal anterior slippage.  Osteopenia  No acute compression fracture  Aortic atherosclerosis  Large volume of colonic stool, suggest constipation   Electronically Signed   By: Jerilynn Mages.  Shick M.D.   On: 09/06/2015 13:48    Hip Imaging: Hip-R DG 2-3 views:   Results for orders placed during the hospital encounter of 09/06/15  DG HIP UNILAT W OR W/O PELVIS 2-3 VIEWS RIGHT   Narrative CLINICAL DATA:  Bilateral hip pain, history of arthritis. Hip pain is worse on the right.  EXAM: DG HIP (WITH OR WITHOUT PELVIS) 2-3V RIGHT  COMPARISON:  09/06/2015  FINDINGS: Bony pelvis and hips appear symmetric and intact. Specifically, the right hip demonstrates normal alignment without acute osseous finding or fracture. No significant arthropathy. Mild sclerosis of the SI joints and symphysis pubis. Lower lumbar facet arthropathy again noted. Benign vascular pelvic calcifications.  IMPRESSION: Osteopenia without acute process.   Electronically Signed   By: Jerilynn Mages.  Shick M.D.   On: 09/06/2015 13:51    Hip-L DG 2-3 views:  Results for orders placed during the hospital encounter of 09/06/15  DG HIP UNILAT W OR W/O PELVIS 2-3 VIEWS LEFT   Narrative CLINICAL DATA:  Acute left hip pain, history of arthritis  EXAM: DG HIP (WITH OR WITHOUT PELVIS) 2-3V LEFT  COMPARISON:  09/06/2015  FINDINGS: Bones appear osteopenic. Left hip is intact without fracture or malalignment. No subluxation or dislocation. No significant arthropathy or degenerative process.  IMPRESSION: No acute finding of the left hip.   Electronically Signed   By: Jerilynn Mages.  Shick M.D.   On: 09/06/2015 13:49    Knee Imaging: Knee-R DG 1-2 views:  Results for orders placed during the hospital encounter of 09/06/15  DG Knee 1-2 Views Right   Narrative CLINICAL DATA:  Anterior knee pain  EXAM: RIGHT KNEE - 1-2 VIEW  COMPARISON:  09/06/2015  FINDINGS: Normal right knee alignment without acute fracture or effusion. Mild osteoarthritis of the medial and lateral compartments with minor joint space loss and bony spurring. Two small ossified loose joint bodies present in the midline posteriorly projecting over the tibial spines on the frontal view.  On the lateral view, there is  advanced degenerative arthritis of the patellofemoral joint with joint space loss, sclerosis and bony spurring.  IMPRESSION: Advanced right knee patellofemoral osteoarthritis.  No acute osseous finding, fracture or effusion.   Electronically Signed   By: Jerilynn Mages.  Shick M.D.   On: 09/06/2015 13:54    Knee-L DG 1-2 views:  Results for orders placed during the hospital encounter of 09/06/15  DG Knee 1-2 Views Left   Narrative CLINICAL DATA:  History of osteoarthritis, bilateral knee pain anteriorly  EXAM: LEFT KNEE - 1-2 VIEW  COMPARISON:  09/06/2015  FINDINGS: Normal alignment without acute osseous finding, fracture or effusion. Preserved joint spaces. No significant arthropathy. Soft tissue calcifications present along the fibula head. These are nonspecific.  IMPRESSION: No acute osseous finding or significant arthropathy.   Electronically Signed   By: Jerilynn Mages.  Shick M.D.   On: 09/06/2015 13:52    Note: Imaging results reviewed.  Meds  The patient has a current medication list which includes the following prescription(s): amiodarone, amlodipine, atorvastatin, calcium carbonate, clobetasol cream, cvs gentle laxative, duloxetine, glimepiride, glucose blood, insulin glargine, pen needles 31gx5/16", lantus solostar, levothyroxine, metoprolol succinate, onetouch delica lancets 0000000, telmisartan, vitamin d (ergocalciferol), and oxycodone.  Current Outpatient Prescriptions on File Prior to Visit  Medication Sig  . amiodarone (PACERONE) 100 MG tablet Take 1 tablet (100 mg total) by mouth 2 (two) times daily.  Marland Kitchen amLODipine (NORVASC) 5 MG tablet Take 1 tablet (5 mg total) by mouth daily.  . calcium carbonate (TUMS - DOSED IN MG ELEMENTAL CALCIUM) 500 MG chewable tablet Chew 1 tablet by mouth as needed for indigestion or heartburn.  . clobetasol cream (TEMOVATE) 0.05 % APPLY TO AFFECTED NON-FACIAL AREAS TWICE DAILY UNTIL CLEAR  . [facility]S GENTLE LAXATIVE 5 MG EC tablet TAKE 2 TABLETS (10 MG  TOTAL) BY MOUTH ONCE.  Marland Kitchen glimepiride (AMARYL) 4 MG tablet Take 4 mg by mouth 2 (two) times daily. 1 tab AM, 1/2 tab PM  . insulin glargine (LANTUS) 100 UNIT/ML injection Inject 8 Units into the skin at bedtime.   Marland Kitchen LANTUS SOLOSTAR 100 UNIT/ML Solostar Pen Inject 8 Units into the skin at bedtime.    No current facility-administered medications on file prior to visit.     ROS  Constitutional: Denies any fever or chills Gastrointestinal: No reported hemesis, hematochezia, vomiting, or acute GI distress Musculoskeletal: Denies any acute onset joint swelling, redness, loss of ROM, or weakness Neurological: No reported episodes of acute onset apraxia, aphasia, dysarthria, agnosia, amnesia, paralysis, loss of coordination, or loss of consciousness  Allergies  Ms. Seaman is allergic to buspirone; citalopram; lisinopril; metformin; pravastatin; sitagliptin; tramadol; diltiazem hcl; gabapentin; hydralazine; and lovastatin.  [place]  Medical:  Ms. Bargeron  has a past medical history of Anemia; Anxiety; Aortic valve stenosis; Arthritis; Bowel obstruction ([place]); CKD (chronic kidney disease); Colostomy in place [facility]); Diabetes mellitus without complication ([place]); Diverticulitis large intestine; Dysrhythmia; Ectopic atrial tachycardia ([place]); Gastritis; GI bleed; Heart murmur; Hyperlipidemia; Hypertension; Hypothyroidism; MRSA (methicillin resistant Staphylococcus aureus); Neuropathy ([place]); Neuropathy in diabetes ([place]); Obesity; Shortness of breath dyspnea; and Vertigo. Family: family history includes Diabetes in her mother; Hypertension in her father. Surgical:  has a past surgical history that includes Colectomy; laparotomy closure of cecal perforation (05/09/2013); Tonsillectomy; Back surgery; Cardiac catheterization; Abdominal hysterectomy; Debridement of abdominal wall abscess (N/A, 02/22/2015); Excision mass abdominal (N/A, 02/24/2015); Application if wound vac (N/A, 02/24/2015); Excision mass abdominal (N/A,  02/26/2015); Application if wound vac (N/A, 02/26/2015); Wound debridement (N/A, 03/03/2015); Wound debridement (N/A, 03/09/2015); Flexible sigmoidoscopy (06/19/2015); Colostomy reversal (N/A, 07/12/2015); laparotomy (07/12/2015); laparoscopy (07/12/2015); Lysis of adhesion (07/12/2015); and laparotomy (N/A, 02/06/2015). Tobacco:  reports that she quit smoking about 2 years ago. Her smoking use included Cigarettes. She has a 0.25 pack-year smoking history. She has never used smokeless tobacco. Alcohol:  reports that she does not drink alcohol. Drug:  reports that she does not use drugs.  Constitutional Exam  Vitals: Blood pressure (!) 154/81, pulse 64, temperature 98.6 F (37 C), temperature source Oral, resp. rate 16, height 5\' 3"  (1.6 m), weight 201 lb (  91.2 kg), SpO2 98 %. General appearance: Well nourished, well developed, and well hydrated. In no acute distress Calculated BMI/Body habitus: Body mass index is 35.61 kg/m. (35-39.9 kg/m2) Severe obesity (Class II) - 136% higher incidence of chronic pain. Goal is < 165 lbs. (BMI<30) Psych/Mental status: Alert and oriented x 3 (person, place, & time) Eyes: PERLA Respiratory: No evidence of acute respiratory distress  Cervical Spine Exam  Inspection: No masses, redness, or swelling Alignment: Symmetrical Functional ROM: ROM appears unrestricted Stability: No instability detected Muscle strength & Tone: Functionally intact Sensory: Unimpaired Palpation: Non-contributory  Upper Extremity (UE) Exam    Side: Right upper extremity  Side: Left upper extremity  Inspection: No masses, redness, swelling, or asymmetry  Inspection: No masses, redness, swelling, or asymmetry  Functional ROM: ROM appears unrestricted  Functional ROM: ROM appears unrestricted  Muscle strength & Tone: Functionally intact  Muscle strength & Tone: Functionally intact  Sensory: Unimpaired  Sensory: Unimpaired  Palpation: Non-contributory  Palpation: Non-contributory   Thoracic  Spine Exam  Inspection: No masses, redness, or swelling Alignment: Symmetrical Functional ROM: ROM appears unrestricted Stability: No instability detected Sensory: Unimpaired Muscle strength & Tone: Functionally intact Palpation: Non-contributory  Lumbar Spine Exam  Inspection: No masses, redness, or swelling Alignment: Symmetrical Functional ROM: ROM appears unrestricted Stability: No instability detected Muscle strength & Tone: Functionally intact Sensory: Unimpaired Palpation: Non-contributory Provocative Tests: Lumbar Hyperextension and rotation test: evaluation deferred today       Patrick's Maneuver: evaluation deferred today              Gait & Posture Assessment  Ambulation: Unassisted Gait: Relatively normal for age and body habitus Posture: WNL   Lower Extremity Exam    Side: Right lower extremity  Side: Left lower extremity  Inspection: No masses, redness, swelling, or asymmetry  Inspection: No masses, redness, swelling, or asymmetry  Functional ROM: ROM appears unrestricted  Functional ROM: ROM appears unrestricted  Muscle strength & Tone: Functionally intact  Muscle strength & Tone: Functionally intact  Sensory: Unimpaired  Sensory: Unimpaired  Palpation: Non-contributory  Palpation: Non-contributory    Assessment & Plan  Primary Diagnosis & Pertinent Problem List: The primary encounter diagnosis was Chronic low back pain (Location of Secondary source of pain) (Bilateral) (L>R). 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  Visit Diagnosis: 1. Chronic low back pain (Location of Secondary source of pain) (Bilateral) (L>R)   2. Lumbar spondylosis, unspecified spinal osteoarthritis   3. 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 Chronic pain   5. 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  No new Assessment & Plan notes have been filed under this hospital service since the last note was generated. 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      Pharmacotherapy (Medications Ordered): Meds ordered this encounter  Medications  . oxyCODONE (OXY IR/ROXICODONE) 5 MG immediate release tablet    Sig: Take 1 tablet (5 mg total) by mouth every 8 (eight) hours as needed for severe pain.    Dispense:  90 tablet    Refill:  0    Do not place this medication, or any other prescription from our practice, on "Automatic Refill". Patient may have prescription filled one day early if pharmacy is closed on scheduled refill date. Do not fill until: 09/25/15 To last until: 10/25/15    [facility] &  Procedure Ordered: Orders Placed This Encounter  Procedures  . LUMBAR FACET(MEDIAL BRANCH NERVE BLOCK) MBNB    Imaging Ordered: None  Interventional Therapies: Scheduled:  Diagnostic Bilateral lumbar facet block under fluoroscopy and IV sedation.   Considering:  None at this time.    PRN Procedures:  None at this time.    Referral(s) or Consult(s): None at this time.  New Prescriptions   OXYCODONE (OXY IR/ROXICODONE) 5 MG IMMEDIATE RELEASE TABLET    Take 1 tablet (5 mg total) by mouth every 8 (eight) hours as needed for severe pain.    Medications administered during this visit: Ms. Lucado had no medications administered during this visit.  Requested PM Follow-up: Return in about 1 month (around 10/26/2015) for (1-Mo) Med-Mgmt, In addition, Schedule Procedure, (ASAP).  Future Appointments Date Time Provider [place]  09/27/2015 11:15 AM Clayburn Pert, MD [facility] None  10/25/2015 9:40 AM Milinda Pointer, MD [facility] None    Primary Care Physician: Adin Hector, MD Location: [facility] Facility Note by: Kathlen Brunswick. Dossie Arbour, M.D, DABA, DABAPM, DABPM, DABIPP, FIPP  Pain Score Disclaimer: We use the NRS-11 scale. This is a self-reported, subjective measurement of pain severity with only modest accuracy. It is used primarily to identify changes within a particular patient. It must be understood that outpatient pain scales are significantly less accurate that those used for research, where they can be applied under ideal controlled circumstances with minimal exposure to variables. In reality, the score is likely to be a combination of pain intensity and pain affect, where pain affect describes the degree of emotional arousal or changes in action readiness caused by the sensory experience of pain. Factors such as social and work situation, setting, emotional state, anxiety levels, expectation, and prior pain experience may influence pain perception and  show large inter-individual differences that may also be affected by time variables.  Patient instructions provided during this appointment: Patient Instructions   GENERAL RISKS AND COMPLICATIONS  What are the risk, side effects and possible complications? Generally speaking, most procedures are safe.  However, with any procedure there are risks, side effects, and the possibility of complications.  The risks and complications are dependent upon the sites that are lesioned,  or the type of nerve block to be performed.  The closer the procedure is to the spine, the more serious the risks are.  Great care is taken when placing the radio frequency needles, block needles or lesioning probes, but sometimes complications can occur. 1. Infection: Any time there is an injection through the skin, there is a risk of infection.  This is why sterile conditions are used for these blocks.  There are four possible types of infection. 1. Localized skin infection. 2. Central Nervous System Infection-This can be in the form of Meningitis, which can be deadly. 3. Epidural Infections-This can be in the form of an epidural abscess, which can cause pressure inside of the spine, causing compression of the spinal cord with subsequent paralysis. This would require an emergency surgery to decompress, and there are no guarantees that the patient would recover from the paralysis. 4. Discitis-This is an infection of the intervertebral discs.  It occurs in about 1% of discography procedures.  It is difficult to treat and it may lead to surgery.        2. Pain: the needles have to go through skin and soft tissues, will cause soreness.       3. Damage to internal structures:  The nerves to be lesioned may be near blood vessels or    other nerves which can be potentially damaged.       4. Bleeding: Bleeding is more common if the patient is taking blood thinners such as  aspirin, Coumadin, Ticiid, Plavix, etc., or if he/she have some  genetic predisposition  such as hemophilia. Bleeding into the spinal canal can cause compression of the spinal  cord with subsequent paralysis.  This would require an emergency surgery to  decompress and there are no guarantees that the patient would recover from the  paralysis.       5. Pneumothorax:  Puncturing of a lung is a possibility, every time a needle is introduced in  the area of the chest or upper back.  Pneumothorax refers to free air around the  collapsed lung(s), inside of the thoracic cavity (chest cavity).  Another two possible  complications related to a similar event would include: Hemothorax and Chylothorax.   These are variations of the Pneumothorax, where instead of air around the collapsed  lung(s), you may have blood or chyle, respectively.       6. Spinal headaches: They may occur with any procedures in the area of the spine.       7. Persistent CSF (Cerebro-Spinal Fluid) leakage: This is a rare problem, but may occur  with prolonged intrathecal or epidural catheters either due to the formation of a fistulous  track or a dural tear.       8. Nerve damage: By working so close to the spinal cord, there is always a possibility of  nerve damage, which could be as serious as a permanent spinal cord injury with  paralysis.       9. Death:  Although rare, severe deadly allergic reactions known as "Anaphylactic  reaction" can occur to any of the medications used.      10. Worsening of the symptoms:  We can always make thing worse.  What are the chances of something like this happening? Chances of any of this occuring are extremely low.  By statistics, you have more of a chance of getting killed in a motor vehicle accident: while driving to the hospital than any of the above occurring .  Nevertheless, you should be aware that they are possibilities.  In general, it is similar to taking a shower.  Everybody knows that you can slip, hit your head and get killed.  Does that mean that you should  not shower again?  Nevertheless always keep in mind that statistics do not mean anything if you happen to be on the wrong side of them.  Even if a procedure has a 1 (one) in a 1,000,000 (million) chance of going wrong, it you happen to be that one..Also, keep in mind that by statistics, you have more of a chance of having something go wrong when taking medications.  Who should not have this procedure? If you are on a blood thinning medication (e.g. Coumadin, Plavix, see list of "Blood Thinners"), or if you have an active infection going on, you should not have the procedure.  If you are taking any blood thinners, please inform your physician.  How should I prepare for this procedure?  Do not eat or drink anything at least six hours prior to the procedure.  Bring a driver with you .  It cannot be a taxi.  Come accompanied by an adult that can drive you back, and that is strong enough to help you if your legs get weak or numb from the local anesthetic.  Take all of your medicines the morning of the procedure with just enough water to swallow them.  If you have diabetes, make sure that you are scheduled to have your procedure done first thing in the morning, whenever possible.  If you have diabetes, take only half of your insulin dose and notify our nurse that you have done so as soon as you arrive at the clinic.  If you are diabetic, but only take blood sugar pills (oral hypoglycemic), then do not take them on the morning of your procedure.  You may take them after you have had the procedure.  Do not take aspirin or any aspirin-containing medications, at least eleven (11) days prior to the procedure.  They may prolong bleeding.  Wear loose fitting clothing that may be easy to take off and that you would not mind if it got stained with Betadine or blood.  Do not wear any jewelry or perfume  Remove any nail coloring.  It will interfere with some of our monitoring equipment.  NOTE: Remember  that this is not meant to be interpreted as a complete list of all possible complications.  Unforeseen problems may occur.  BLOOD THINNERS The following drugs contain aspirin or other products, which can cause increased bleeding during surgery and should not be taken for 2 weeks prior to and 1 week after surgery.  If you should need take something for relief of minor pain, you may take acetaminophen which is found in Tylenol,m Datril, Anacin-3 and Panadol. It is not blood thinner. The products listed below are.  Do not take any of the products listed below in addition to any listed on your instruction sheet.  A.P.C or A.P.C with Codeine Codeine Phosphate Capsules #3 Ibuprofen Ridaura  ABC compound Congesprin Imuran rimadil  Advil Cope Indocin Robaxisal  Alka-Seltzer Effervescent Pain Reliever and Antacid Coricidin or Coricidin-D  Indomethacin Rufen  Alka-Seltzer plus Cold Medicine Cosprin Ketoprofen S-A-C Tablets  Anacin Analgesic Tablets or Capsules Coumadin Korlgesic Salflex  Anacin Extra Strength Analgesic tablets or capsules CP-2 Tablets Lanoril Salicylate  Anaprox Cuprimine Capsules Levenox Salocol  Anexsia-D Dalteparin Magan Salsalate  Anodynos Darvon compound Magnesium Salicylate Sine-off  Ansaid Dasin Capsules Magsal Sodium Salicylate  Anturane Depen Capsules Marnal Soma  APF Arthritis pain formula Dewitt's Pills Measurin Stanback  Argesic Dia-Gesic Meclofenamic Sulfinpyrazone  Arthritis Bayer Timed Release Aspirin Diclofenac Meclomen Sulindac  Arthritis pain formula Anacin Dicumarol Medipren Supac  Analgesic (Safety coated) Arthralgen Diffunasal Mefanamic Suprofen  Arthritis Strength Bufferin Dihydrocodeine Mepro Compound Suprol  Arthropan liquid Dopirydamole Methcarbomol with Aspirin Synalgos  ASA tablets/Enseals Disalcid Micrainin Tagament  Ascriptin Doan's Midol Talwin  Ascriptin A/D Dolene Mobidin Tanderil  Ascriptin Extra Strength Dolobid Moblgesic Ticlid  Ascriptin with  Codeine Doloprin or Doloprin with Codeine Momentum Tolectin  Asperbuf Duoprin Mono-gesic Trendar  Aspergum Duradyne Motrin or Motrin IB Triminicin  Aspirin plain, buffered or enteric coated Durasal Myochrisine Trigesic  Aspirin Suppositories Easprin Nalfon Trillsate  Aspirin with Codeine Ecotrin Regular or Extra Strength Naprosyn Uracel  Atromid-S Efficin Naproxen Ursinus  Auranofin Capsules Elmiron Neocylate Vanquish  Axotal Emagrin Norgesic Verin  Azathioprine Empirin or Empirin with Codeine Normiflo Vitamin E  Azolid Emprazil Nuprin Voltaren  Bayer Aspirin plain, buffered or children's or timed BC Tablets or powders Encaprin Orgaran Warfarin Sodium  Buff-a-Comp Enoxaparin Orudis Zorpin  Buff-a-Comp with Codeine Equegesic Os-Cal-Gesic   Buffaprin Excedrin plain, buffered or Extra Strength Oxalid   Bufferin Arthritis Strength Feldene Oxphenbutazone   Bufferin plain or Extra Strength Feldene Capsules Oxycodone with Aspirin   Bufferin with Codeine Fenoprofen Fenoprofen Pabalate or Pabalate-SF   Buffets II Flogesic Panagesic   Buffinol plain or Extra Strength Florinal or Florinal with Codeine Panwarfarin   Buf-Tabs Flurbiprofen Penicillamine   Butalbital Compound Four-way cold tablets Penicillin   Butazolidin Fragmin Pepto-Bismol   Carbenicillin Geminisyn Percodan   Carna Arthritis Reliever Geopen Persantine   Carprofen Gold's salt Persistin   Chloramphenicol Goody's Phenylbutazone   Chloromycetin Haltrain Piroxlcam   Clmetidine heparin Plaquenil   Cllnoril Hyco-pap Ponstel   Clofibrate Hydroxy chloroquine Propoxyphen         Before stopping any of these medications, be sure to consult the physician who ordered them.  Some, such as Coumadin (Warfarin) are ordered to prevent or treat serious conditions such as "deep thrombosis", "pumonary embolisms", and other heart problems.  The amount of time that you may need off of the medication may also vary with the medication and the reason for  which you were taking it.  If you are taking any of these medications, please make sure you notify your pain physician before you undergo any procedures.         Facet Blocks Patient Information  Description: The facets are joints in the spine between the vertebrae.  Like any joints in the body, facets can become irritated and painful.  Arthritis can also effect the facets.  By injecting steroids and local anesthetic in and around these joints, we can temporarily block the nerve supply to them.  Steroids act directly on irritated nerves and tissues to reduce selling and inflammation which often leads to decreased pain.  Facet blocks may be done anywhere along the spine from the neck to the low back depending upon the location of your pain.   After numbing the skin with local anesthetic (like Novocaine), a small needle is passed onto the facet joints under x-ray guidance.  You may experience a sensation of pressure while this is being done.  The entire block usually lasts about 15-25 minutes.   Conditions which may be treated by facet blocks:   Low back/buttock pain  Neck/shoulder pain  Certain types of headaches  Preparation for  the injection:  1. Do not eat any solid food or dairy products within 8 hours of your appointment. 2. You may drink clear liquid up to 3 hours before appointment.  Clear liquids include water, black coffee, juice or soda.  No milk or cream please. 3. You may take your regular medication, including pain medications, with a sip of water before your appointment.  Diabetics should hold regular insulin (if taken separately) and take 1/2 normal NPH dose the morning of the procedure.  Carry some sugar containing items with you to your appointment. 4. A driver must accompany you and be prepared to drive you home after your procedure. 5. Bring all your current medications with you. 6. An IV may be inserted and sedation may be given at the discretion of the  physician. 7. A blood pressure cuff, EKG and other monitors will often be applied during the procedure.  Some patients may need to have extra oxygen administered for a short period. 8. You will be asked to provide medical information, including your allergies and medications, prior to the procedure.  We must know immediately if you are taking blood thinners (like Coumadin/Warfarin) or if you are allergic to IV iodine contrast (dye).  We must know if you could possible be pregnant.  Possible side-effects:   Bleeding from needle site  Infection (rare, may require surgery)  Nerve injury (rare)  Numbness & tingling (temporary)  Difficulty urinating (rare, temporary)  Spinal headache (a headache worse with upright posture)  Light-headedness (temporary)  Pain at injection site (serveral days)  Decreased blood pressure (rare, temporary)  Weakness in arm/leg (temporary)  Pressure sensation in back/neck (temporary)   Call if you experience:   Fever/chills associated with headache or increased back/neck pain  Headache worsened by an upright position  New onset, weakness or numbness of an extremity below the injection site  Hives or difficulty breathing (go to the emergency room)  Inflammation or drainage at the injection site(s)  Severe back/neck pain greater than usual  New symptoms which are concerning to you  Please note:  Although the local anesthetic injected can often make your back or neck feel good for several hours after the injection, the pain will likely return. It takes 3-7 days for steroids to work.  You may not notice any pain relief for at least one week.  If effective, we will often do a series of 2-3 injections spaced 3-6 weeks apart to maximally decrease your pain.  After the initial series, you may be a candidate for a more permanent nerve block of the facets.  If you have any questions, please call #[phone] [place] 8 HOURS PRIOR TO PROCEDURE BRING A DRIVER HOLD DIABETIC MEDICATION UNTIL AFTER PROCEDURE. TAKE BLOOD PRESSURE MEDICINE THE MORNING OF PROCEDURE.

## 2015-09-25 NOTE — Patient Instructions (Signed)
GENERAL RISKS AND COMPLICATIONS  What are the risk, side effects and possible complications? Generally speaking, most procedures are safe.  However, with any procedure there are risks, side effects, and the possibility of complications.  The risks and complications are dependent upon the sites that are lesioned, or the type of nerve block to be performed.  The closer the procedure is to the spine, the more serious the risks are.  Great care is taken when placing the radio frequency needles, block needles or lesioning probes, but sometimes complications can occur. 1. Infection: Any time there is an injection through the skin, there is a risk of infection.  This is why sterile conditions are used for these blocks.  There are four possible types of infection. 1. Localized skin infection. 2. Central Nervous System Infection-This can be in the form of Meningitis, which can be deadly. 3. Epidural Infections-This can be in the form of an epidural abscess, which can cause pressure inside of the spine, causing compression of the spinal cord with subsequent paralysis. This would require an emergency surgery to decompress, and there are no guarantees that the patient would recover from the paralysis. 4. Discitis-This is an infection of the intervertebral discs.  It occurs in about 1% of discography procedures.  It is difficult to treat and it may lead to surgery.        2. Pain: the needles have to go through skin and soft tissues, will cause soreness.       3. Damage to internal structures:  The nerves to be lesioned may be near blood vessels or    other nerves which can be potentially damaged.       4. Bleeding: Bleeding is more common if the patient is taking blood thinners such as  aspirin, Coumadin, Ticiid, Plavix, etc., or if he/she have some genetic predisposition  such as hemophilia. Bleeding into the spinal canal can cause compression of the spinal  cord with subsequent paralysis.  This would require an  emergency surgery to  decompress and there are no guarantees that the patient would recover from the  paralysis.       5. Pneumothorax:  Puncturing of a lung is a possibility, every time a needle is introduced in  the area of the chest or upper back.  Pneumothorax refers to free air around the  collapsed lung(s), inside of the thoracic cavity (chest cavity).  Another two possible  complications related to a similar event would include: Hemothorax and Chylothorax.   These are variations of the Pneumothorax, where instead of air around the collapsed  lung(s), you may have blood or chyle, respectively.       6. Spinal headaches: They may occur with any procedures in the area of the spine.       7. Persistent CSF (Cerebro-Spinal Fluid) leakage: This is a rare problem, but may occur  with prolonged intrathecal or epidural catheters either due to the formation of a fistulous  track or a dural tear.       8. Nerve damage: By working so close to the spinal cord, there is always a possibility of  nerve damage, which could be as serious as a permanent spinal cord injury with  paralysis.       9. Death:  Although rare, severe deadly allergic reactions known as "Anaphylactic  reaction" can occur to any of the medications used.      10. Worsening of the symptoms:  We can always make thing worse.    What are the chances of something like this happening? Chances of any of this occuring are extremely low.  By statistics, you have more of a chance of getting killed in a motor vehicle accident: while driving to the hospital than any of the above occurring .  Nevertheless, you should be aware that they are possibilities.  In general, it is similar to taking a shower.  Everybody knows that you can slip, hit your head and get killed.  Does that mean that you should not shower again?  Nevertheless always keep in mind that statistics do not mean anything if you happen to be on the wrong side of them.  Even if a procedure has a 1  (one) in a 1,000,000 (million) chance of going wrong, it you happen to be that one..Also, keep in mind that by statistics, you have more of a chance of having something go wrong when taking medications.  Who should not have this procedure? If you are on a blood thinning medication (e.g. Coumadin, Plavix, see list of "Blood Thinners"), or if you have an active infection going on, you should not have the procedure.  If you are taking any blood thinners, please inform your physician.  How should I prepare for this procedure?  Do not eat or drink anything at least six hours prior to the procedure.  Bring a driver with you .  It cannot be a taxi.  Come accompanied by an adult that can drive you back, and that is strong enough to help you if your legs get weak or numb from the local anesthetic.  Take all of your medicines the morning of the procedure with just enough water to swallow them.  If you have diabetes, make sure that you are scheduled to have your procedure done first thing in the morning, whenever possible.  If you have diabetes, take only half of your insulin dose and notify our nurse that you have done so as soon as you arrive at the clinic.  If you are diabetic, but only take blood sugar pills (oral hypoglycemic), then do not take them on the morning of your procedure.  You may take them after you have had the procedure.  Do not take aspirin or any aspirin-containing medications, at least eleven (11) days prior to the procedure.  They may prolong bleeding.  Wear loose fitting clothing that may be easy to take off and that you would not mind if it got stained with Betadine or blood.  Do not wear any jewelry or perfume  Remove any nail coloring.  It will interfere with some of our monitoring equipment.  NOTE: Remember that this is not meant to be interpreted as a complete list of all possible complications.  Unforeseen problems may occur.  BLOOD THINNERS The following drugs  contain aspirin or other products, which can cause increased bleeding during surgery and should not be taken for 2 weeks prior to and 1 week after surgery.  If you should need take something for relief of minor pain, you may take acetaminophen which is found in Tylenol,m Datril, Anacin-3 and Panadol. It is not blood thinner. The products listed below are.  Do not take any of the products listed below in addition to any listed on your instruction sheet.  A.P.C or A.P.C with Codeine Codeine Phosphate Capsules #3 Ibuprofen Ridaura  ABC compound Congesprin Imuran rimadil  Advil Cope Indocin Robaxisal  Alka-Seltzer Effervescent Pain Reliever and Antacid Coricidin or Coricidin-D  Indomethacin Rufen    Alka-Seltzer plus Cold Medicine Cosprin Ketoprofen S-A-C Tablets  Anacin Analgesic Tablets or Capsules Coumadin Korlgesic Salflex  Anacin Extra Strength Analgesic tablets or capsules CP-2 Tablets Lanoril Salicylate  Anaprox Cuprimine Capsules Levenox Salocol  Anexsia-D Dalteparin Magan Salsalate  Anodynos Darvon compound Magnesium Salicylate Sine-off  Ansaid Dasin Capsules Magsal Sodium Salicylate  Anturane Depen Capsules Marnal Soma  APF Arthritis pain formula Dewitt's Pills Measurin Stanback  Argesic Dia-Gesic Meclofenamic Sulfinpyrazone  Arthritis Bayer Timed Release Aspirin Diclofenac Meclomen Sulindac  Arthritis pain formula Anacin Dicumarol Medipren Supac  Analgesic (Safety coated) Arthralgen Diffunasal Mefanamic Suprofen  Arthritis Strength Bufferin Dihydrocodeine Mepro Compound Suprol  Arthropan liquid Dopirydamole Methcarbomol with Aspirin Synalgos  ASA tablets/Enseals Disalcid Micrainin Tagament  Ascriptin Doan's Midol Talwin  Ascriptin A/D Dolene Mobidin Tanderil  Ascriptin Extra Strength Dolobid Moblgesic Ticlid  Ascriptin with Codeine Doloprin or Doloprin with Codeine Momentum Tolectin  Asperbuf Duoprin Mono-gesic Trendar  Aspergum Duradyne Motrin or Motrin IB Triminicin  Aspirin  plain, buffered or enteric coated Durasal Myochrisine Trigesic  Aspirin Suppositories Easprin Nalfon Trillsate  Aspirin with Codeine Ecotrin Regular or Extra Strength Naprosyn Uracel  Atromid-S Efficin Naproxen Ursinus  Auranofin Capsules Elmiron Neocylate Vanquish  Axotal Emagrin Norgesic Verin  Azathioprine Empirin or Empirin with Codeine Normiflo Vitamin E  Azolid Emprazil Nuprin Voltaren  Bayer Aspirin plain, buffered or children's or timed BC Tablets or powders Encaprin Orgaran Warfarin Sodium  Buff-a-Comp Enoxaparin Orudis Zorpin  Buff-a-Comp with Codeine Equegesic Os-Cal-Gesic   Buffaprin Excedrin plain, buffered or Extra Strength Oxalid   Bufferin Arthritis Strength Feldene Oxphenbutazone   Bufferin plain or Extra Strength Feldene Capsules Oxycodone with Aspirin   Bufferin with Codeine Fenoprofen Fenoprofen Pabalate or Pabalate-SF   Buffets II Flogesic Panagesic   Buffinol plain or Extra Strength Florinal or Florinal with Codeine Panwarfarin   Buf-Tabs Flurbiprofen Penicillamine   Butalbital Compound Four-way cold tablets Penicillin   Butazolidin Fragmin Pepto-Bismol   Carbenicillin Geminisyn Percodan   Carna Arthritis Reliever Geopen Persantine   Carprofen Gold's salt Persistin   Chloramphenicol Goody's Phenylbutazone   Chloromycetin Haltrain Piroxlcam   Clmetidine heparin Plaquenil   Cllnoril Hyco-pap Ponstel   Clofibrate Hydroxy chloroquine Propoxyphen         Before stopping any of these medications, be sure to consult the physician who ordered them.  Some, such as Coumadin (Warfarin) are ordered to prevent or treat serious conditions such as "deep thrombosis", "pumonary embolisms", and other heart problems.  The amount of time that you may need off of the medication may also vary with the medication and the reason for which you were taking it.  If you are taking any of these medications, please make sure you notify your pain physician before you undergo any  procedures.         Facet Blocks Patient Information  Description: The facets are joints in the spine between the vertebrae.  Like any joints in the body, facets can become irritated and painful.  Arthritis can also effect the facets.  By injecting steroids and local anesthetic in and around these joints, we can temporarily block the nerve supply to them.  Steroids act directly on irritated nerves and tissues to reduce selling and inflammation which often leads to decreased pain.  Facet blocks may be done anywhere along the spine from the neck to the low back depending upon the location of your pain.   After numbing the skin with local anesthetic (like Novocaine), a small needle is passed onto the facet joints under x-ray guidance.    You may experience a sensation of pressure while this is being done.  The entire block usually lasts about 15-25 minutes.   Conditions which may be treated by facet blocks:   Low back/buttock pain  Neck/shoulder pain  Certain types of headaches  Preparation for the injection:  1. Do not eat any solid food or dairy products within 8 hours of your appointment. 2. You may drink clear liquid up to 3 hours before appointment.  Clear liquids include water, black coffee, juice or soda.  No milk or cream please. 3. You may take your regular medication, including pain medications, with a sip of water before your appointment.  Diabetics should hold regular insulin (if taken separately) and take 1/2 normal NPH dose the morning of the procedure.  Carry some sugar containing items with you to your appointment. 4. A driver must accompany you and be prepared to drive you home after your procedure. 5. Bring all your current medications with you. 6. An IV may be inserted and sedation may be given at the discretion of the physician. 7. A blood pressure cuff, EKG and other monitors will often be applied during the procedure.  Some patients may need to have extra oxygen  administered for a short period. 8. You will be asked to provide medical information, including your allergies and medications, prior to the procedure.  We must know immediately if you are taking blood thinners (like Coumadin/Warfarin) or if you are allergic to IV iodine contrast (dye).  We must know if you could possible be pregnant.  Possible side-effects:   Bleeding from needle site  Infection (rare, may require surgery)  Nerve injury (rare)  Numbness & tingling (temporary)  Difficulty urinating (rare, temporary)  Spinal headache (a headache worse with upright posture)  Light-headedness (temporary)  Pain at injection site (serveral days)  Decreased blood pressure (rare, temporary)  Weakness in arm/leg (temporary)  Pressure sensation in back/neck (temporary)   Call if you experience:   Fever/chills associated with headache or increased back/neck pain  Headache worsened by an upright position  New onset, weakness or numbness of an extremity below the injection site  Hives or difficulty breathing (go to the emergency room)  Inflammation or drainage at the injection site(s)  Severe back/neck pain greater than usual  New symptoms which are concerning to you  Please note:  Although the local anesthetic injected can often make your back or neck feel good for several hours after the injection, the pain will likely return. It takes 3-7 days for steroids to work.  You may not notice any pain relief for at least one week.  If effective, we will often do a series of 2-3 injections spaced 3-6 weeks apart to maximally decrease your pain.  After the initial series, you may be a candidate for a more permanent nerve block of the facets.  If you have any questions, please call #[phone] [place] 8 HOURS PRIOR TO PROCEDURE BRING A DRIVER HOLD DIABETIC MEDICATION UNTIL AFTER PROCEDURE. TAKE BLOOD PRESSURE  MEDICINE THE MORNING OF PROCEDURE.

## 2015-09-27 ENCOUNTER — Encounter: Payer: Self-pay | Admitting: General Surgery

## 2015-09-27 ENCOUNTER — Ambulatory Visit (INDEPENDENT_AMBULATORY_CARE_PROVIDER_SITE_OTHER): Payer: PPO | Admitting: General Surgery

## 2015-09-27 VITALS — BP 148/95 | HR 71 | Temp 98.6°F | Ht 63.0 in | Wt 201.2 lb

## 2015-09-27 DIAGNOSIS — IMO0002 Reserved for concepts with insufficient information to code with codable children: Secondary | ICD-10-CM

## 2015-09-27 DIAGNOSIS — Z9889 Other specified postprocedural states: Secondary | ICD-10-CM

## 2015-09-27 NOTE — Progress Notes (Signed)
Outpatient Surgical Follow Up  09/27/2015  [name] is an 65 y.o. female.   Chief Complaint  Patient presents with  . Routine Post Op    Colostomy Takedown (07/12/15)- Dr. Adonis Huguenin    HPI: 65 year old female returns to clinic for additional wound check 10 weeks status post open colostomy takedown. Patient reports she is been doing well. She's been evaluated by the pain clinic and has been started on a pain contract. She's been very happy with the care. She reports that she is performing local wound care with the help of her daughter at home. She denies any fevers, chills, nausea, vomiting, chest pain, shortness of breath. She has been having some constipation and asked about laxative usage. No other changes since her last visit.  Past Medical History:  Diagnosis Date  . Anemia   . Anxiety   . Aortic valve stenosis   . Arthritis    feet, legs  . Bowel obstruction ([place])   . CKD (chronic kidney disease)    protein in urine  . Colostomy in place [facility])   . Diabetes mellitus without complication ([place])   . Diverticulitis large intestine   . Dysrhythmia   . Ectopic atrial tachycardia ([place])   . Gastritis   . GI bleed   . Heart murmur   . Hyperlipidemia   . Hypertension   . Hypothyroidism   . MRSA (methicillin resistant Staphylococcus aureus)    at abdominal wound.  Jan 2017.  Treated.   . Neuropathy ([place])   . Neuropathy in diabetes ([place])   . Obesity   . Shortness of breath dyspnea   . Vertigo     Past Surgical History:  Procedure Laterality Date  . ABDOMINAL HYSTERECTOMY    . APPLICATION OF WOUND VAC N/A 02/24/2015   Procedure: APPLICATION OF WOUND VAC;  Surgeon: Clayburn Pert, MD;  Location: [facility];  Service: General;  Laterality: N/A;  . APPLICATION OF WOUND VAC N/A 02/26/2015   Procedure: APPLICATION OF WOUND VAC;  Surgeon: Clayburn Pert, MD;  Location: [facility];  Service: General;  Laterality: N/A;  . BACK SURGERY     spur frmoved from lower back  . CARDIAC  CATHETERIZATION    . COLECTOMY    . COLOSTOMY REVERSAL N/A 07/12/2015   Procedure: COLOSTOMY REVERSAL;  Surgeon: Clayburn Pert, MD;  Location: [facility];  Service: General;  Laterality: N/A;  . DEBRIDEMENT OF ABDOMINAL WALL ABSCESS N/A 02/22/2015   Procedure: DEBRIDEMENT OF ABDOMINAL WALL ABSCESS;  Surgeon: Clayburn Pert, MD;  Location: [facility];  Service: General;  Laterality: N/A;  . EXCISION MASS ABDOMINAL N/A 02/24/2015   Procedure: EXCISION MASS ABDOMINAL  / [place];  Surgeon: Clayburn Pert, MD;  Location: [facility];  Service: General;  Laterality: N/A;  . EXCISION MASS ABDOMINAL N/A 02/26/2015   Procedure: EXCISION MASS ABDOMINAL/wash out;  Surgeon: Clayburn Pert, MD;  Location: [facility];  Service: General;  Laterality: N/A;  . FLEXIBLE SIGMOIDOSCOPY  06/19/2015   Procedure: FLEXIBLE SIGMOIDOSCOPY;  Surgeon: Lucilla Lame, MD;  Location: [facility];  Service: Endoscopy;;  UNABLE TO ACCESS OSTOMY SITE FOR ACCESS INTO COLON  . LAPAROSCOPY  07/12/2015   Procedure: LAPAROSCOPY DIAGNOSTIC;  Surgeon: Clayburn Pert, MD;  Location: [facility];  Service: General;;  . LAPAROTOMY  07/12/2015   Procedure: EXPLORATORY LAPAROTOMY;  Surgeon: Clayburn Pert, MD;  Location: [facility];  Service: General;;  . LAPAROTOMY N/A 02/06/2015   Procedure: Laparotomy, reduction of incarcerated parastomal hernia, repair of parastomal hernia with  mesh;  Surgeon: Sherri Rad, MD;  Location: [facility];  Service: General;  Laterality: N/A;  . laparotomy closure of cecal perforation  05/09/2013   Dr. Marina Gravel  . LYSIS OF ADHESION  07/12/2015   Procedure: LYSIS OF ADHESION;  Surgeon: Clayburn Pert, MD;  Location: [facility];  Service: General;;  . TONSILLECTOMY    . WOUND DEBRIDEMENT N/A 03/03/2015   Procedure: DEBRIDEMENT ABDOMINAL WOUND;  Surgeon: Florene Glen, MD;  Location: [facility];  Service: General;  Laterality: N/A;  . WOUND DEBRIDEMENT N/A 03/09/2015   Procedure: DEBRIDEMENT ABDOMINAL WOUND;  Surgeon: Florene Glen, MD;  Location: [facility];  Service: General;  Laterality: N/A;    Family History  Problem Relation Age of Onset  . Diabetes Mother   . Hypertension Father     Social History:  reports that she quit smoking about 2 years ago. Her smoking use included Cigarettes. She has a 0.25 pack-year smoking history. She has never used smokeless tobacco. She reports that she does not drink alcohol or use drugs.  Allergies:  Allergies  Allergen Reactions  . Buspirone Other (See Comments)    Weakness  . Citalopram Other (See Comments)  . Lisinopril Cough  . Metformin Diarrhea    At 1000 mg dose  . Pravastatin Other (See Comments)    insomnia  . Sitagliptin Other (See Comments)    constipation  . Tramadol Itching  . Diltiazem Hcl Palpitations  . Gabapentin Palpitations  . Hydralazine Rash  . Lovastatin Palpitations    Medications reviewed.    ROS A multipoint review of systems was completed, all pertinent positives and negatives are documented within the history of present illness and remainder negative.   BP (!) 148/95   Pulse 71   Temp 98.6 F (37 C) (Oral)   Ht 5\' 3"  (1.6 m)   Wt 91.3 kg (201 lb 3.2 oz)   BMI 35.64 kg/m   Physical Exam Gen.: No acute distress Chest: Clear to auscultation Heart: Regular rhythm Abdomen: Large, soft, nontender. Prior ostomy site well-healed without any evidence of infection or wound breakdown. Midline incision healing well, previous area of wound opening in the middle by the umbilicus markedly decreased incised without any additional tunneling. No evidence of spreading erythema or infection. New area of palpable fluid in the most cephalad aspect of the midline incision. No evidence of erythema or purulence.    No results found for this or any previous visit (from the past 48 hour(s)). No results found.  Assessment/Plan:  1. H/O colostomy 65 year old female 10 weeks status post open colostomy takedown. Midline wound healing well. New  area of fluid collection opened sharply at the bedside today with serous fluid removed. Discussed continued wound care with the patient and that it is progressing nicely. Again went over the signs and symptoms of infection and should any of them occur from her incision site she is to return to clinic immediately. Otherwise she is to continue the local wound care she's been having performed and follow-up in clinic in 6 weeks for which should hopefully be a last wound check from her colostomy takedown. All questions answered to her satisfaction.     Clayburn Pert, MD FACS General Surgeon  09/27/2015,11:32 AM

## 2015-09-27 NOTE — Patient Instructions (Addendum)
We will have you follow-up in 5 weeks. Please see appointment below. When you come to your appointment next time, make sure you have your co-pay as you will be outside of your post-op window.  Call with any questions or concerns or if the pocket of fluid that was drained today returns. We will work you in prior to your next scheduled appointment.

## 2015-10-11 ENCOUNTER — Telehealth: Payer: Self-pay | Admitting: General Surgery

## 2015-10-11 NOTE — Telephone Encounter (Signed)
Patient stated that the blister on her stomach has some puss coming from it and it is a little red. She would like to talk to someone about it. No fever.

## 2015-10-11 NOTE — Telephone Encounter (Signed)
Returned phone call to patient at this time and she states the area that was opened at last appointment is filling with pus once again. Patient placed on schedule to be seen tomorrow morning with Dr. Adonis Huguenin at 1130am.

## 2015-10-12 ENCOUNTER — Encounter: Payer: Self-pay | Admitting: General Surgery

## 2015-10-12 ENCOUNTER — Ambulatory Visit (INDEPENDENT_AMBULATORY_CARE_PROVIDER_SITE_OTHER): Payer: PPO | Admitting: General Surgery

## 2015-10-12 VITALS — BP 143/87 | HR 68 | Temp 98.3°F | Ht 63.0 in | Wt 203.5 lb

## 2015-10-12 DIAGNOSIS — Z4889 Encounter for other specified surgical aftercare: Secondary | ICD-10-CM

## 2015-10-12 NOTE — Patient Instructions (Signed)
Keep your appointment on 11/01/15. If you have any questions or concerns, please call our office prior to your appointment.

## 2015-10-12 NOTE — Progress Notes (Signed)
Outpatient Surgical Follow Up  10/12/2015  [name] is an 65 y.o. female.   Chief Complaint  Patient presents with  . Follow-up    Colostomy Takedown (07/12/15)- Dr. Adonis Huguenin    HPI: 65 year old female returns to clinic for continued follow-up from a colostomy takedown that was performed on May 24. She is primarily here due to 2 areas of her midline wound that have yet to fully heal. Is having some serous drainage but no purulence or erythema. She denies any fevers, chills, nausea, vomiting, chest pain, shortness of breath, diarrhea, constipation. She thinks she is otherwise progressing quite well and has been very happy with her surgical experience.  Past Medical History:  Diagnosis Date  . Anemia   . Anxiety   . Aortic valve stenosis   . Arthritis    feet, legs  . Bowel obstruction ([place])   . CKD (chronic kidney disease)    protein in urine  . Colostomy in place [facility])   . Diabetes mellitus without complication ([place])   . Diverticulitis large intestine   . Dysrhythmia   . Ectopic atrial tachycardia ([place])   . Gastritis   . GI bleed   . Heart murmur   . Hyperlipidemia   . Hypertension   . Hypothyroidism   . MRSA (methicillin resistant Staphylococcus aureus)    at abdominal wound.  Jan 2017.  Treated.   . Neuropathy ([place])   . Neuropathy in diabetes ([place])   . Obesity   . Shortness of breath dyspnea   . Vertigo     Past Surgical History:  Procedure Laterality Date  . ABDOMINAL HYSTERECTOMY    . APPLICATION OF WOUND VAC N/A 02/24/2015   Procedure: APPLICATION OF WOUND VAC;  Surgeon: Clayburn Pert, MD;  Location: [facility];  Service: General;  Laterality: N/A;  . APPLICATION OF WOUND VAC N/A 02/26/2015   Procedure: APPLICATION OF WOUND VAC;  Surgeon: Clayburn Pert, MD;  Location: [facility];  Service: General;  Laterality: N/A;  . BACK SURGERY     spur frmoved from lower back  . CARDIAC CATHETERIZATION    . COLECTOMY    . COLOSTOMY REVERSAL N/A 07/12/2015   Procedure:  COLOSTOMY REVERSAL;  Surgeon: Clayburn Pert, MD;  Location: [facility];  Service: General;  Laterality: N/A;  . DEBRIDEMENT OF ABDOMINAL WALL ABSCESS N/A 02/22/2015   Procedure: DEBRIDEMENT OF ABDOMINAL WALL ABSCESS;  Surgeon: Clayburn Pert, MD;  Location: [facility];  Service: General;  Laterality: N/A;  . EXCISION MASS ABDOMINAL N/A 02/24/2015   Procedure: EXCISION MASS ABDOMINAL  / [place];  Surgeon: Clayburn Pert, MD;  Location: [facility];  Service: General;  Laterality: N/A;  . EXCISION MASS ABDOMINAL N/A 02/26/2015   Procedure: EXCISION MASS ABDOMINAL/wash out;  Surgeon: Clayburn Pert, MD;  Location: [facility];  Service: General;  Laterality: N/A;  . FLEXIBLE SIGMOIDOSCOPY  06/19/2015   Procedure: FLEXIBLE SIGMOIDOSCOPY;  Surgeon: Lucilla Lame, MD;  Location: [place];  Service: Endoscopy;;  UNABLE TO ACCESS OSTOMY SITE FOR ACCESS INTO COLON  . LAPAROSCOPY  07/12/2015   Procedure: LAPAROSCOPY DIAGNOSTIC;  Surgeon: Clayburn Pert, MD;  Location: [facility];  Service: General;;  . LAPAROTOMY  07/12/2015   Procedure: EXPLORATORY LAPAROTOMY;  Surgeon: Clayburn Pert, MD;  Location: [facility];  Service: General;;  . LAPAROTOMY N/A 02/06/2015   Procedure: Laparotomy, reduction of incarcerated parastomal hernia, repair of parastomal hernia with mesh;  Surgeon: Sherri Rad, MD;  Location: [facility];  Service: General;  Laterality: N/A;  .  laparotomy closure of cecal perforation  05/09/2013   Dr. Marina Gravel  . LYSIS OF ADHESION  07/12/2015   Procedure: LYSIS OF ADHESION;  Surgeon: Clayburn Pert, MD;  Location: [facility];  Service: General;;  . TONSILLECTOMY    . WOUND DEBRIDEMENT N/A 03/03/2015   Procedure: DEBRIDEMENT ABDOMINAL WOUND;  Surgeon: Florene Glen, MD;  Location: [facility];  Service: General;  Laterality: N/A;  . WOUND DEBRIDEMENT N/A 03/09/2015   Procedure: DEBRIDEMENT ABDOMINAL WOUND;  Surgeon: Florene Glen, MD;  Location: [facility];  Service: General;  Laterality: N/A;    Family  History  Problem Relation Age of Onset  . Diabetes Mother   . Hypertension Father     Social History:  reports that she quit smoking about 2 years ago. Her smoking use included Cigarettes. She has a 0.25 pack-year smoking history. She has never used smokeless tobacco. She reports that she does not drink alcohol or use drugs.  Allergies:  Allergies  Allergen Reactions  . Buspirone Other (See Comments)    Weakness  . Citalopram Other (See Comments)  . Lisinopril Cough  . Metformin Diarrhea    At 1000 mg dose  . Pravastatin Other (See Comments)    insomnia  . Sitagliptin Other (See Comments)    constipation  . Tramadol Itching  . Diltiazem Hcl Palpitations  . Gabapentin Palpitations  . Hydralazine Rash  . Lovastatin Palpitations    Medications reviewed.    ROS A multipoint review of systems was completed, all pertinent positives and negatives are documented within the history of present illness and remainder are negative.   BP (!) 143/87   Pulse 68   Temp 98.3 F (36.8 C) (Oral)   Ht 5\' 3"  (1.6 m)   Wt 92.3 kg (203 lb 8 oz)   BMI 36.05 kg/m   Physical Exam Gen.: No acute distress Chest: Clear to auscultation Heart: Regular rhythm Abdomen: Soft and nontender. Complex midline scar with a 1 cm area of skin opening around her umbilicus without any evidence of erythema or drainage. A 3 mm area at the most cephalad portion of the incision with hyper-granulation tissue but no evidence of erythema or drainage.    No results found for this or any previous visit (from the past 48 hour(s)). No results found.  Assessment/Plan:  1. Aftercare following surgery 65 year old female doing quite well after her colostomy takedown. Her most cephalad wound was treated with silver nitrate today. Nira Conn Winter continue with dry dressings. Doing very well. Discussed continuing wound care with patient voiced understanding. She'll follow-up in clinic in 3 weeks for additional  follow-up.     Clayburn Pert, MD FACS General Surgeon  10/12/2015,11:30 AM

## 2015-10-17 NOTE — Progress Notes (Signed)
Encounter canceled

## 2015-10-24 ENCOUNTER — Ambulatory Visit: Payer: PPO | Admitting: Pain Medicine

## 2015-10-25 ENCOUNTER — Ambulatory Visit: Payer: PPO | Admitting: Pain Medicine

## 2015-11-01 ENCOUNTER — Encounter: Payer: Self-pay | Admitting: General Surgery

## 2015-11-01 ENCOUNTER — Ambulatory Visit (INDEPENDENT_AMBULATORY_CARE_PROVIDER_SITE_OTHER): Payer: PPO | Admitting: General Surgery

## 2015-11-01 VITALS — BP 166/97 | HR 71 | Temp 98.6°F | Ht 63.0 in | Wt 200.8 lb

## 2015-11-01 DIAGNOSIS — Z4889 Encounter for other specified surgical aftercare: Secondary | ICD-10-CM | POA: Diagnosis not present

## 2015-11-01 NOTE — Progress Notes (Signed)
Outpatient Surgical Follow Up  11/01/2015  [name] is an 65 y.o. female.   Chief Complaint  Patient presents with  . Follow-up    Colostomy Takedown (07/12/15)    HPI: 65 year old female returns to clinic nearly 4 months status post open colostomy reversal. Patient denies any complaints. She continues to have 2 areas of her midline wound that is not fully healed. That she denies any fevers, chills, nausea, vomiting, chest pain, shortness of breath, diarrhea, constipation.  Past Medical History:  Diagnosis Date  . Anemia   . Anxiety   . Aortic valve stenosis   . Arthritis    feet, legs  . Bowel obstruction ([name])   . CKD (chronic kidney disease)    protein in urine  . Colostomy in place [facility])   . Diabetes mellitus without complication ([place])   . Diverticulitis large intestine   . Dysrhythmia   . Ectopic atrial tachycardia ([place])   . Gastritis   . GI bleed   . Heart murmur   . Hyperlipidemia   . Hypertension   . Hypothyroidism   . MRSA (methicillin resistant Staphylococcus aureus)    at abdominal wound.  Jan 2017.  Treated.   . Neuropathy ([place])   . Neuropathy in diabetes ([place])   . Obesity   . Shortness of breath dyspnea   . Vertigo     Past Surgical History:  Procedure Laterality Date  . ABDOMINAL HYSTERECTOMY    . APPLICATION OF WOUND VAC N/A 02/24/2015   Procedure: APPLICATION OF WOUND VAC;  Surgeon: Clayburn Pert, MD;  Location: [facility];  Service: General;  Laterality: N/A;  . APPLICATION OF WOUND VAC N/A 02/26/2015   Procedure: APPLICATION OF WOUND VAC;  Surgeon: Clayburn Pert, MD;  Location: [facility];  Service: General;  Laterality: N/A;  . BACK SURGERY     spur frmoved from lower back  . CARDIAC CATHETERIZATION    . COLECTOMY    . COLOSTOMY REVERSAL N/A 07/12/2015   Procedure: COLOSTOMY REVERSAL;  Surgeon: Clayburn Pert, MD;  Location: [facility];  Service: General;  Laterality: N/A;  . DEBRIDEMENT OF ABDOMINAL WALL ABSCESS N/A 02/22/2015   Procedure:  DEBRIDEMENT OF ABDOMINAL WALL ABSCESS;  Surgeon: Clayburn Pert, MD;  Location: [facility];  Service: General;  Laterality: N/A;  . EXCISION MASS ABDOMINAL N/A 02/24/2015   Procedure: EXCISION MASS ABDOMINAL  / [place];  Surgeon: Clayburn Pert, MD;  Location: [facility];  Service: General;  Laterality: N/A;  . EXCISION MASS ABDOMINAL N/A 02/26/2015   Procedure: EXCISION MASS ABDOMINAL/wash out;  Surgeon: Clayburn Pert, MD;  Location: [facility];  Service: General;  Laterality: N/A;  . FLEXIBLE SIGMOIDOSCOPY  06/19/2015   Procedure: FLEXIBLE SIGMOIDOSCOPY;  Surgeon: Lucilla Lame, MD;  Location: [place];  Service: Endoscopy;;  UNABLE TO ACCESS OSTOMY SITE FOR ACCESS INTO COLON  . LAPAROSCOPY  07/12/2015   Procedure: LAPAROSCOPY DIAGNOSTIC;  Surgeon: Clayburn Pert, MD;  Location: [facility];  Service: General;;  . LAPAROTOMY  07/12/2015   Procedure: EXPLORATORY LAPAROTOMY;  Surgeon: Clayburn Pert, MD;  Location: [facility];  Service: General;;  . LAPAROTOMY N/A 02/06/2015   Procedure: Laparotomy, reduction of incarcerated parastomal hernia, repair of parastomal hernia with mesh;  Surgeon: Sherri Rad, MD;  Location: [facility];  Service: General;  Laterality: N/A;  . laparotomy closure of cecal perforation  05/09/2013   Dr. Marina Gravel  . LYSIS OF ADHESION  07/12/2015   Procedure: LYSIS OF ADHESION;  Surgeon: Clayburn Pert, MD;  Location:  [facility];  Service: General;;  . TONSILLECTOMY    . WOUND DEBRIDEMENT N/A 03/03/2015   Procedure: DEBRIDEMENT ABDOMINAL WOUND;  Surgeon: Florene Glen, MD;  Location: [facility];  Service: General;  Laterality: N/A;  . WOUND DEBRIDEMENT N/A 03/09/2015   Procedure: DEBRIDEMENT ABDOMINAL WOUND;  Surgeon: Florene Glen, MD;  Location: [facility];  Service: General;  Laterality: N/A;    Family History  Problem Relation Age of Onset  . Diabetes Mother   . Hypertension Father     Social History:  reports that she quit smoking about 2 years ago. Her smoking use  included Cigarettes. She has a 0.25 pack-year smoking history. She has never used smokeless tobacco. She reports that she does not drink alcohol or use drugs.  Allergies:  Allergies  Allergen Reactions  . Buspirone Other (See Comments)    Weakness  . Citalopram Other (See Comments)  . Lisinopril Cough  . Metformin Diarrhea    At 1000 mg dose  . Pravastatin Other (See Comments)    insomnia  . Sitagliptin Other (See Comments)    constipation  . Tramadol Itching  . Diltiazem Hcl Palpitations  . Gabapentin Palpitations  . Hydralazine Rash  . Lovastatin Palpitations    Medications reviewed.    ROS A multipoint review of systems was completed, all pertinent positives and negatives are documented within the history of present illness and remainder are negative.   BP (!) 166/97   Pulse 71   Temp 98.6 F (37 C) (Oral)   Ht 5\' 3"  (1.6 m)   Wt 91.1 kg (200 lb 12.8 oz)   BMI 35.57 kg/m   Physical Exam Gen.: No acute distress Neck: Supple and nontender Chest: Clear to auscultation Heart: Regular rhythm Abdomen: Large, soft, nontender. Complex midline incision site with 2 subcentimeter areas of hyper granulation. One is at the area of the umbilicus and the other is within 2 cm of the most cephalad aspect of the midline incision. There is no evidence of spreading erythema or purulent drainage. Extremities: Moves all extremities well.    No results found for this or any previous visit (from the past 48 hour(s)). No results found.  Assessment/Plan:  1. Aftercare following surgery 65 year old female approximately 4 months status post open colostomy takedown and reversal. Doing well. The 2 areas of hyper granulation tissue were treated with silver nitrate in clinic today. Discussed anticipated drainage from the wounds with the patient. Also discussed continuing dry dressing changes. She voiced understanding. Plan for her to follow-up in clinic in 5 weeks for an additional wound  check. She is to call clinic sooner if she is concerned about her incisions to be evaluated prior to that.     Clayburn Pert, MD FACS General Surgeon  11/01/2015,9:56 AM

## 2015-11-01 NOTE — Patient Instructions (Addendum)
We will see you back in the office in 5 weeks.  Please call our office sooner if you have any questions or concerns.

## 2015-11-02 ENCOUNTER — Ambulatory Visit: Payer: PPO | Attending: Pain Medicine | Admitting: Pain Medicine

## 2015-11-02 ENCOUNTER — Encounter: Payer: Self-pay | Admitting: Pain Medicine

## 2015-11-02 VITALS — BP 153/101 | HR 64 | Temp 98.8°F | Resp 15 | Ht 63.0 in | Wt 203.0 lb

## 2015-11-02 DIAGNOSIS — M47816 Spondylosis without myelopathy or radiculopathy, lumbar region: Secondary | ICD-10-CM

## 2015-11-02 DIAGNOSIS — G8929 Other chronic pain: Secondary | ICD-10-CM | POA: Diagnosis present

## 2015-11-02 DIAGNOSIS — Z9889 Other specified postprocedural states: Secondary | ICD-10-CM | POA: Diagnosis present

## 2015-11-02 DIAGNOSIS — M545 Low back pain: Secondary | ICD-10-CM | POA: Insufficient documentation

## 2015-11-02 NOTE — Progress Notes (Signed)
Safety precautions to be maintained throughout the outpatient stay will include: orient to surroundings, keep bed in low position, maintain call bell within reach at all times, provide assistance with transfer out of bed and ambulation.  Saw surgeon yesterday for  Stomach wounds. No bottle brought with m,eds

## 2015-11-02 NOTE — Progress Notes (Signed)
Patient ID: [name], female   DOB: Dec 10, 1950, 65 y.o.   MRN: [id number] The patient came into the clinics today after last time being seen on 09/25/2015. She had a colostomy takedown that is still healing. She did not keep her 10/25/2015 appointment for medication refill. Today she did not bring her pills to be candidate. She came in today for a diagnostic bilateral lumbar facet block under fluoroscopic guidance and IV sedation but her abdominal wound has not completely healed and she indicates that there is some discharge from it. The procedure was canceled and the patient was instructed to go back to her surgeon to evaluate the wound. We will not do any interventions until where sure that everything has healed appropriately and that there are no ongoing infections. This visit was treated as a nurse visit. I did not personally see the patient.

## 2015-11-03 ENCOUNTER — Telehealth: Payer: Self-pay | Admitting: *Deleted

## 2015-11-03 NOTE — Telephone Encounter (Signed)
Denies complications post procedure. 

## 2015-11-07 ENCOUNTER — Ambulatory Visit: Payer: PPO | Attending: Pain Medicine | Admitting: Pain Medicine

## 2015-11-07 ENCOUNTER — Encounter: Payer: Self-pay | Admitting: Pain Medicine

## 2015-11-07 ENCOUNTER — Other Ambulatory Visit
Admission: RE | Admit: 2015-11-07 | Discharge: 2015-11-07 | Disposition: A | Discharge status: Home / Self Care | Payer: PPO | Source: Ambulatory Visit | Attending: Pain Medicine | Admitting: Pain Medicine

## 2015-11-07 VITALS — BP 132/89 | HR 67 | Temp 98.0°F | Resp 18 | Ht 63.0 in | Wt 203.0 lb

## 2015-11-07 DIAGNOSIS — N183 Chronic kidney disease, stage 3 (moderate): Secondary | ICD-10-CM | POA: Diagnosis not present

## 2015-11-07 DIAGNOSIS — E1122 Type 2 diabetes mellitus with diabetic chronic kidney disease: Secondary | ICD-10-CM | POA: Diagnosis not present

## 2015-11-07 DIAGNOSIS — I129 Hypertensive chronic kidney disease with stage 1 through stage 4 chronic kidney disease, or unspecified chronic kidney disease: Secondary | ICD-10-CM | POA: Insufficient documentation

## 2015-11-07 DIAGNOSIS — M25552 Pain in left hip: Secondary | ICD-10-CM | POA: Diagnosis not present

## 2015-11-07 DIAGNOSIS — E1140 Type 2 diabetes mellitus with diabetic neuropathy, unspecified: Secondary | ICD-10-CM | POA: Insufficient documentation

## 2015-11-07 DIAGNOSIS — M25551 Pain in right hip: Secondary | ICD-10-CM | POA: Insufficient documentation

## 2015-11-07 DIAGNOSIS — M542 Cervicalgia: Secondary | ICD-10-CM | POA: Insufficient documentation

## 2015-11-07 DIAGNOSIS — M545 Low back pain: Secondary | ICD-10-CM | POA: Diagnosis present

## 2015-11-07 DIAGNOSIS — Z79891 Long term (current) use of opiate analgesic: Secondary | ICD-10-CM | POA: Diagnosis not present

## 2015-11-07 DIAGNOSIS — Z01818 Encounter for other preprocedural examination: Secondary | ICD-10-CM | POA: Diagnosis not present

## 2015-11-07 DIAGNOSIS — Z933 Colostomy status: Secondary | ICD-10-CM | POA: Diagnosis not present

## 2015-11-07 DIAGNOSIS — E669 Obesity, unspecified: Secondary | ICD-10-CM | POA: Insufficient documentation

## 2015-11-07 DIAGNOSIS — M47816 Spondylosis without myelopathy or radiculopathy, lumbar region: Secondary | ICD-10-CM | POA: Diagnosis not present

## 2015-11-07 DIAGNOSIS — M25561 Pain in right knee: Secondary | ICD-10-CM

## 2015-11-07 DIAGNOSIS — M25562 Pain in left knee: Secondary | ICD-10-CM

## 2015-11-07 DIAGNOSIS — E1142 Type 2 diabetes mellitus with diabetic polyneuropathy: Secondary | ICD-10-CM

## 2015-11-07 DIAGNOSIS — Z794 Long term (current) use of insulin: Secondary | ICD-10-CM | POA: Insufficient documentation

## 2015-11-07 DIAGNOSIS — R109 Unspecified abdominal pain: Secondary | ICD-10-CM | POA: Insufficient documentation

## 2015-11-07 DIAGNOSIS — G8929 Other chronic pain: Secondary | ICD-10-CM | POA: Insufficient documentation

## 2015-11-07 DIAGNOSIS — Z5181 Encounter for therapeutic drug level monitoring: Secondary | ICD-10-CM

## 2015-11-07 DIAGNOSIS — M79606 Pain in leg, unspecified: Secondary | ICD-10-CM

## 2015-11-07 DIAGNOSIS — F1190 Opioid use, unspecified, uncomplicated: Secondary | ICD-10-CM

## 2015-11-07 DIAGNOSIS — M25559 Pain in unspecified hip: Secondary | ICD-10-CM

## 2015-11-07 LAB — MAGNESIUM: MAGNESIUM: 2.2 mg/dL (ref 1.7–2.4)

## 2015-11-07 LAB — C-REACTIVE PROTEIN: CRP: 0.6 mg/dL (ref <1.0)

## 2015-11-07 LAB — SEDIMENTATION RATE: SED RATE: 16 mm/h (ref 0–30)

## 2015-11-07 LAB — VITAMIN B12: Vitamin B-12: 273 pg/mL (ref 180–914)

## 2015-11-07 MED ORDER — OXYCODONE HCL 5 MG PO TABS: 5.0000 mg | ORAL_TABLET | Freq: Three times a day (TID) | ORAL | 0 refills | Status: DC | PRN

## 2015-11-07 NOTE — Progress Notes (Signed)
Patient's Name: [name]  MRN: [id number]  Referring Provider: Adin Hector, MD  DOB: 1951/01/06  PCP: Adin Hector, MD  DOS: 11/07/2015  Note by: Kathlen Brunswick. Dossie Arbour, MD  Service setting: Ambulatory outpatient  Specialty: Interventional Pain Management  Location: [facility] (AMB) Pain Management Facility    Patient type: Established   Primary Reason(s) for Visit: Encounter for prescription drug management (Level of risk: moderate) CC: Back Pain (low)  HPI  [name] is a 65 y.o. year old, female patient, who returns today as an established patient. She has Diverticulitis of colon; Obesity; Small bowel obstruction ([place]); Absolute anemia; Aortic heart valve narrowing; Arthritis; B12 deficiency; Benign essential HTN; Chronic kidney disease (CKD), stage III (moderate); Diverticulitis; Focal and segmental hyalinosis; Bleeding gastrointestinal; Acquired atrophy of thyroid; Abnormal presence of protein in urine; Psoriasis; Paroxysmal supraventricular tachycardia ([place]); Breathlessness on exertion; Type 2 diabetes mellitus ([place]); Colostomy, retracted ([place]); Preop examination; Chronic pain; Chronic abdominal pain (Location of Primary Source of Pain) (Bilateral) (L>R); Long term current use of opiate analgesic; Long term prescription opiate use; Opiate use (60 MME/Day); Encounter for therapeutic drug level monitoring; Encounter for pain management planning; Neurogenic pain; Musculoskeletal pain; Visceral abdominal pain; Diabetic peripheral neuropathy ([place]); Chronic low back pain (Location of Secondary source of pain) (Bilateral) (L>R); Chronic lower extremity pain (Location of Tertiary source of pain) (Bilateral) (R>L); Chronic hip pain (Bilateral) (L>R); Chronic knee pain (Bilateral) (L>R); Chronic neck pain (Bilateral) (L>R); H/O colostomy; Diabetic neuropathy ([place]); Recurrent major depressive disorder, in full remission ([place]); Lumbar spondylosis; and Lumbar facet syndrome (Bilateral) (L>R) on her problem  list.. Her primarily concern today is the Back Pain (low)  Pain Assessment: Self-Reported Pain Score: 7  Clinically the patient looks like a 2/10 Reported level is inconsistent with clinical observations. Information on the proper use of the pain score provided to the patient today. Pain Type: Chronic pain Pain Location: Back Pain Orientation: Lower Pain Descriptors / Indicators: Aching Pain Frequency: Constant  The patient comes into the clinics today for pharmacological management of her chronic pain. I last saw this patient on 11/02/2015. The patient  reports that she does not use drugs. Her body mass index is 35.96 kg/m.  Date of Last Visit: 11/02/15 Service Provided on Last Visit: Evaluation (was here to have a procedure , but procedure was postponed due to colostopmy reversal.)  Controlled Substance Pharmacotherapy Assessment & REMS (Risk Evaluation and Mitigation Strategy)  Analgesic:Oxycodone IR 5 mg every 8 hours (15 mg/day) MME/day:60 mg/day Pill Count: Did not bring pills for pill count.  Reminded to bring to all appointments.Final warning given today. Pharmacokinetics: Onset of action (Liberation/Absorption): Within expected pharmacological parameters Time to Peak effect (Distribution): Timing and results are as within normal expected parameters Duration of action (Metabolism/Excretion): Within normal limits for medication Pharmacodynamics: Analgesic Effect: More than 50% Activity Facilitation: Medication(s) allow patient to sit, stand, walk, and do the basic ADLs Perceived Effectiveness: Described as relatively effective, allowing for increase in activities of daily living (ADL) Side-effects or Adverse reactions: None reported Monitoring: [place] PMP: Online review of the past 9-month period conducted. Compliant with practice rules and regulations All Historical UDS testing(s):  No results found for: MDMA, COCAINSCRNUR, PCPSCRNUR, THCU, ETH Initial UDS Testing:  Summary   Date Value Ref Range Status  07/03/2015 FINAL  Final    Comment:    ==================================================================== TOXASSURE COMP DRUG ANALYSIS,UR ==================================================================== Test  Result       Flag       Units Drug Present and Declared for Prescription Verification   Duloxetine                     PRESENT      EXPECTED   Trazodone                      PRESENT      EXPECTED   1,3 chlorophenyl piperazine    PRESENT      EXPECTED    1,3-chlorophenyl piperazine is an expected metabolite of    trazodone.   Metoprolol                     PRESENT      EXPECTED Drug Present not Declared for Prescription Verification   Hydrocodone                    255          UNEXPECTED ng/mg creat   Hydromorphone                  197          UNEXPECTED ng/mg creat   Dihydrocodeine                 102          UNEXPECTED ng/mg creat   Norhydrocodone                 153          UNEXPECTED ng/mg creat    Sources of hydrocodone include scheduled prescription    medications. Hydromorphone, dihydrocodeine and norhydrocodone are    expected metabolites of hydrocodone. Hydromorphone and    dihydrocodeine are also available as scheduled prescription    medications. ==================================================================== Test                      Result    Flag   Units      Ref Range   Creatinine              58               mg/dL      >=20 ==================================================================== Declared Medications:  The flagging and interpretation on this report are based on the  following declared medications.  Unexpected results may arise from  inaccuracies in the declared medications.  **Note: The testing scope of this panel includes these medications:  Duloxetine (Cymbalta)  Metoprolol (Toprol)  Trazodone (Desyrel)  **Note: The testing scope of this panel does not include following   reported medications:  Atorvastatin (Lipitor)  Glimepiride (Amaryl)  Insulin (Lantus)  Levothyroxine  Telmisartan (Micardis) ==================================================================== For clinical consultation, please call [phone]. ====================================================================    List of UDS test(s) done:  Lab Results  Component Value Date   SUMMARY FINAL 07/03/2015   UDS interpretation: Compliant          Medication Assessment Form: Reviewed. Patient indicates being compliant with therapy Treatment compliance: Compliant Risk Assessment: Aberrant Behavior: None observed today Substance Use Disorder (SUD) Risk Level: Moderate Risk of opioid abuse or dependence: 0.7-3.0% with doses ? 36 MME/day and 6.1-26% with doses ? 120 MME/day. Opioid Risk Tool (ORT) Score:  4 Moderate Risk for SUD (Score between 4-7) Depression Scale Score: PHQ-2: PHQ-2 Total Score: 0 No depression (0) PHQ-9: PHQ-9 Total Score: 0 No depression (  0-4)  Pharmacologic Plan: No change in therapy, at this time  Laboratory Chemistry  Inflammation Markers Lab Results  Component Value Date   ESRSEDRATE 46 (H) 05/04/2013   Renal Function Lab Results  Component Value Date   BUN 33 (H) 07/22/2015   CREATININE 2.33 (H) 07/22/2015   GFRAA 24 (L) 07/22/2015   GFRNONAA 21 (L) 07/22/2015   Hepatic Function Lab Results  Component Value Date   AST 14 (L) 02/22/2015   ALT 13 (L) 02/22/2015   ALBUMIN 2.5 (L) 07/21/2015   Electrolytes Lab Results  Component Value Date   NA 138 07/22/2015   K 4.2 07/22/2015   CL 110 07/22/2015   CALCIUM 7.6 (L) 07/22/2015   MG 1.8 07/19/2015   Pain Modulating Vitamins No results found for: Marveen Reeks, XL2440NU2, VO5366YQ0, 25OHVITD1, 25OHVITD2, 25OHVITD3, VITAMINB12 Coagulation Parameters Lab Results  Component Value Date   INR 1.28 02/22/2015   LABPROT 16.1 (H) 02/22/2015   APTT 36 02/22/2015   PLT 315 07/22/2015    Cardiovascular Lab Results  Component Value Date   HGB 8.9 (L) 07/22/2015   HCT 28.0 (L) 07/22/2015   Note: Lab results reviewed.  Recent Diagnostic Imaging  Dg Cervical Spine Complete  Result Date: 09/06/2015 CLINICAL DATA:  Bilateral neck pain, chronic. Previous cervical fusion. EXAM: CERVICAL SPINE - COMPLETE 4+ VIEW COMPARISON:  None available FINDINGS: previous anterior cervical fusion at C4-5 and C5-6. There is solid bony fusion at both levels. Normal alignment. Degenerative changes and spondylosis noted at C3-4 and C6-7 with disc space narrowing, sclerosis and anterior osteophytes. Normal prevertebral soft tissues. Facets are aligned but demonstrate diffuse arthropathy. Foramina are patent. Trachea is midline. Lung apices are clear. Odontoid is intact IMPRESSION: Previous anterior cervical fusion spanning C4-C6 with solid fusion at both levels. Degenerative changes and spondylosis at C3-4 and C6-7 as well as diffuse facet arthropathy. No acute finding by plain radiography Electronically Signed   By: Jerilynn Mages.  Shick M.D.   On: 09/06/2015 13:46   Dg Lumbar Spine Complete W/bend  Result Date: 09/06/2015 CLINICAL DATA:  Chronic lower lumbar back pain EXAM: LUMBAR SPINE - COMPLETE WITH BENDING VIEWS COMPARISON:  02/21/2015 CT sagittal reconstructions FINDINGS: Bones are osteopenic. Advanced facet arthropathy at L3 did S1. There is minimal anterior slippage of L4 upon L5 measuring 5 mm secondary to posterior facet arthropathy. Preserved vertebral body heights and disc spaces. No acute compression fracture, wedge-shaped deformity or focal kyphosis. No instability with flexion and extension. Normal appearing pedicles. Mild bilateral SI joint arthropathy. No pars defects. Large volume of retained stool throughout the colon compatible with constipation. Prior cholecystectomy noted. Aortic atherosclerosis present. IMPRESSION: Advanced lumbar facet arthropathy from L3-S1 with associated L4 on L5 minimal  anterior slippage. Osteopenia No acute compression fracture Aortic atherosclerosis Large volume of colonic stool, suggest constipation Electronically Signed   By: Jerilynn Mages.  Shick M.D.   On: 09/06/2015 13:48   Dg Knee 1-2 Views Left  Result Date: 09/06/2015 CLINICAL DATA:  History of osteoarthritis, bilateral knee pain anteriorly EXAM: LEFT KNEE - 1-2 VIEW COMPARISON:  09/06/2015 FINDINGS: Normal alignment without acute osseous finding, fracture or effusion. Preserved joint spaces. No significant arthropathy. Soft tissue calcifications present along the fibula head. These are nonspecific. IMPRESSION: No acute osseous finding or significant arthropathy. Electronically Signed   By: Jerilynn Mages.  Shick M.D.   On: 09/06/2015 13:52   Dg Knee 1-2 Views Right  Result Date: 09/06/2015 CLINICAL DATA:  Anterior knee pain EXAM: RIGHT KNEE - 1-2 VIEW COMPARISON:  09/06/2015 FINDINGS: Normal right knee alignment without acute fracture or effusion. Mild osteoarthritis of the medial and lateral compartments with minor joint space loss and bony spurring. Two small ossified loose joint bodies present in the midline posteriorly projecting over the tibial spines on the frontal view. On the lateral view, there is advanced degenerative arthritis of the patellofemoral joint with joint space loss, sclerosis and bony spurring. IMPRESSION: Advanced right knee patellofemoral osteoarthritis. No acute osseous finding, fracture or effusion. Electronically Signed   By: Jerilynn Mages.  Shick M.D.   On: 09/06/2015 13:54   Dg Hip Unilat W Or W/o Pelvis 2-3 Views Left  Result Date: 09/06/2015 CLINICAL DATA:  Acute left hip pain, history of arthritis EXAM: DG HIP (WITH OR WITHOUT PELVIS) 2-3V LEFT COMPARISON:  09/06/2015 FINDINGS: Bones appear osteopenic. Left hip is intact without fracture or malalignment. No subluxation or dislocation. No significant arthropathy or degenerative process. IMPRESSION: No acute finding of the left hip. Electronically Signed   By: Jerilynn Mages.   Shick M.D.   On: 09/06/2015 13:49   Dg Hip Unilat W Or W/o Pelvis 2-3 Views Right  Result Date: 09/06/2015 CLINICAL DATA:  Bilateral hip pain, history of arthritis. Hip pain is worse on the right. EXAM: DG HIP (WITH OR WITHOUT PELVIS) 2-3V RIGHT COMPARISON:  09/06/2015 FINDINGS: Bony pelvis and hips appear symmetric and intact. Specifically, the right hip demonstrates normal alignment without acute osseous finding or fracture. No significant arthropathy. Mild sclerosis of the SI joints and symphysis pubis. Lower lumbar facet arthropathy again noted. Benign vascular pelvic calcifications. IMPRESSION: Osteopenia without acute process. Electronically Signed   By: Jerilynn Mages.  Shick M.D.   On: 09/06/2015 13:51    Meds  The patient has a current medication list which includes the following prescription(s): amiodarone, amlodipine, atorvastatin, calcium carbonate, clobetasol cream, cvs gentle laxative, duloxetine, gabapentin, glimepiride, glucose blood, insulin glargine, pen needles 31gx5/16", lantus solostar, levothyroxine, metoprolol succinate, onetouch delica lancets 62V, oxycodone, telmisartan, and vitamin d (ergocalciferol).  Current Outpatient Prescriptions on File Prior to Visit  Medication Sig  . amiodarone (PACERONE) 100 MG tablet Take 1 tablet (100 mg total) by mouth 2 (two) times daily.  Marland Kitchen amLODipine (NORVASC) 5 MG tablet Take 1 tablet (5 mg total) by mouth daily.  Marland Kitchen atorvastatin (LIPITOR) 40 MG tablet Take by mouth.  . calcium carbonate (TUMS - DOSED IN MG ELEMENTAL CALCIUM) 500 MG chewable tablet Chew 1 tablet by mouth as needed for indigestion or heartburn.  . clobetasol cream (TEMOVATE) 0.05 % APPLY TO AFFECTED NON-FACIAL AREAS TWICE DAILY UNTIL CLEAR  . CVS GENTLE LAXATIVE 5 MG EC tablet TAKE 2 TABLETS (10 MG TOTAL) BY MOUTH ONCE.  . DULoxetine (CYMBALTA) 30 MG capsule Take by mouth.  . gabapentin (NEURONTIN) 300 MG capsule Take 1 capsule by mouth daily.  Marland Kitchen glimepiride (AMARYL) 4 MG tablet Take 4  mg by mouth 2 (two) times daily. 1 tab AM, 1/2 tab PM  . glucose blood (ONE TOUCH ULTRA TEST) test strip TEST TWICE DAILY  . insulin glargine (LANTUS) 100 UNIT/ML injection Inject 8 Units into the skin at bedtime.   . Insulin Pen Needle (PEN NEEDLES 31GX5/16") 31G X 8 MM MISC Use as directed. Once daily  . LANTUS SOLOSTAR 100 UNIT/ML Solostar Pen Inject 8 Units into the skin at bedtime.   Marland Kitchen levothyroxine (SYNTHROID, LEVOTHROID) 88 MCG tablet TAKE 1 TABLET BY MOUTH ONCE A DAY FOR THYROID  . metoprolol succinate (TOPROL-XL) 25 MG 24 hr tablet TAKE 1 TABLET BY MOUTH ONCE A  DAY  . ONETOUCH DELICA LANCETS 71I MISC TEST TWICE DAILY  . telmisartan (MICARDIS) 80 MG tablet TAKE 1 TABLET BY MOUTH ONCE A DAY  . Vitamin D, Ergocalciferol, (DRISDOL) 50000 units CAPS capsule Take by mouth.   No current facility-administered medications on file prior to visit.     ROS  Constitutional: Denies any fever or chills Gastrointestinal: No reported hemesis, hematochezia, vomiting, or acute GI distress Musculoskeletal: Denies any acute onset joint swelling, redness, loss of ROM, or weakness Neurological: No reported episodes of acute onset apraxia, aphasia, dysarthria, agnosia, amnesia, paralysis, loss of coordination, or loss of consciousness  Allergies  [name] is allergic to buspirone; citalopram; lisinopril; metformin; pravastatin; sitagliptin; tramadol; diltiazem hcl; gabapentin; hydralazine; and lovastatin.  [place]  Medical:  [name]  has a past medical history of Anemia; Anxiety; Aortic valve stenosis; Arthritis; Bowel obstruction ([place]); CKD (chronic kidney disease); Colostomy in place [facility]); Diabetes mellitus without complication ([place]); Diverticulitis large intestine; Dysrhythmia; Ectopic atrial tachycardia ([place]); Gastritis; GI bleed; Heart murmur; Hyperlipidemia; Hypertension; Hypothyroidism; MRSA (methicillin resistant Staphylococcus aureus); Neuropathy ([place]); Neuropathy in diabetes ([place]); Obesity;  Shortness of breath dyspnea; and Vertigo. Family: family history includes Diabetes in her mother; Hypertension in her father. Surgical:  has a past surgical history that includes Colectomy; laparotomy closure of cecal perforation (05/09/2013); Tonsillectomy; Back surgery; Cardiac catheterization; Abdominal hysterectomy; Debridement of abdominal wall abscess (N/A, 02/22/2015); Excision mass abdominal (N/A, 02/24/2015); Application if wound vac (N/A, 02/24/2015); Excision mass abdominal (N/A, 02/26/2015); Application if wound vac (N/A, 02/26/2015); Wound debridement (N/A, 03/03/2015); Wound debridement (N/A, 03/09/2015); Flexible sigmoidoscopy (06/19/2015); Colostomy reversal (N/A, 07/12/2015); laparotomy (07/12/2015); laparoscopy (07/12/2015); Lysis of adhesion (07/12/2015); and laparotomy (N/A, 02/06/2015). Tobacco:  reports that she quit smoking about 2 years ago. Her smoking use included Cigarettes. She has a 0.25 pack-year smoking history. She has never used smokeless tobacco. Alcohol:  reports that she does not drink alcohol. Drug:  reports that she does not use drugs.  Constitutional Exam  General appearance: Well nourished, well developed, and well hydrated. In no acute distress Vitals:   11/07/15 1007 11/07/15 1008  BP:  132/89  Pulse: 67   Resp: 18   Temp: 98 F (36.7 C)   SpO2: 96%   Weight: 203 lb (92.1 kg)   Height: 5\' 3"  (1.6 m)   BMI Assessment: Estimated body mass index is 35.96 kg/m as calculated from the following:   Height as of this encounter: 5\' 3"  (1.6 m).   Weight as of this encounter: 203 lb (92.1 kg).   BMI interpretation: (35-39.9 kg/m2) = Severe obesity (Class II): This range is associated with a 136% higher incidence of chronic pain. BMI Readings from Last 4 Encounters:  11/07/15 35.96 kg/m  11/02/15 35.96 kg/m  11/01/15 35.57 kg/m  10/12/15 36.05 kg/m   Wt Readings from Last 4 Encounters:  11/07/15 203 lb (92.1 kg)  11/02/15 203 lb (92.1 kg)  11/01/15 200 lb 12.8 oz  (91.1 kg)  10/12/15 203 lb 8 oz (92.3 kg)  Psych/Mental status: Alert and oriented x 3 (person, place, & time) Eyes: PERLA Respiratory: No evidence of acute respiratory distress  Cervical Spine Exam  Inspection: No masses, redness, or swelling Alignment: Symmetrical Functional ROM: ROM appears unrestricted Stability: No instability detected Muscle strength & Tone: Functionally intact Sensory: Unimpaired Palpation: Non-contributory  Upper Extremity (UE) Exam    Side: Right upper extremity  Side: Left upper extremity  Inspection: No masses, redness, swelling, or asymmetry  Inspection: No masses, redness, swelling, or  asymmetry  Functional ROM: ROM appears unrestricted          Functional ROM: ROM appears unrestricted          Muscle strength & Tone: Functionally intact  Muscle strength & Tone: Functionally intact  Sensory: Unimpaired  Sensory: Unimpaired  Palpation: Non-contributory  Palpation: Non-contributory   Thoracic Spine Exam  Inspection: No masses, redness, or swelling Alignment: Symmetrical Functional ROM: ROM appears unrestricted Stability: No instability detected Sensory: Unimpaired Muscle strength & Tone: Functionally intact Palpation: Non-contributory  Lumbar Spine Exam  Inspection: No masses, redness, or swelling Alignment: Symmetrical Functional ROM: Decreased ROM Stability: No instability detected Muscle strength & Tone: Functionally intact Sensory: Movement-associated pain Palpation: Complains of area being tender to palpation Provocative Tests: Lumbar Hyperextension and rotation test: Positive bilaterally for facet joint pain. Patrick's Maneuver: evaluation deferred today              Gait & Posture Assessment  Ambulation: Unassisted Gait: Relatively normal for age and body habitus Posture: WNL   Lower Extremity Exam    Side: Right lower extremity  Side: Left lower extremity  Inspection: No masses, redness, swelling, or asymmetry  Inspection: No  masses, redness, swelling, or asymmetry  Functional ROM: ROM appears unrestricted          Functional ROM: ROM appears unrestricted          Muscle strength & Tone: Functionally intact  Muscle strength & Tone: Functionally intact  Sensory: Unimpaired  Sensory: Unimpaired  Palpation: Non-contributory  Palpation: Non-contributory   Assessment & Plan  Primary Diagnosis & Pertinent Problem List: The primary encounter diagnosis was Chronic pain. Diagnoses of Long term current use of opiate analgesic, Opiate use, Chronic abdominal pain (Location of Primary Source of Pain) (Bilateral) (L>R), Chronic low back pain (Location of Secondary source of pain) (Bilateral) (L>R), Chronic pain of lower extremity, unspecified laterality, Lumbar spondylosis, unspecified spinal osteoarthritis, Diabetic peripheral neuropathy ([place]), Encounter for therapeutic drug level monitoring, Chronic hip pain, unspecified laterality, Chronic knee pain (Bilateral) (L>R), Visceral abdominal pain, and Chronic neck pain (Bilateral) (L>R) were also pertinent to this visit.  Visit Diagnosis: 1. Chronic pain   2. Long term current use of opiate analgesic   3. Opiate use   4. Chronic abdominal pain (Location of Primary Source of Pain) (Bilateral) (L>R)   5. Chronic low back pain (Location of Secondary source of pain) (Bilateral) (L>R)   6. Chronic pain of lower extremity, unspecified laterality   7. Lumbar spondylosis, unspecified spinal osteoarthritis   8. Diabetic peripheral neuropathy ([place])   9. Encounter for therapeutic drug level monitoring   10. Chronic hip pain, unspecified laterality   11. Chronic knee pain (Bilateral) (L>R)   12. Visceral abdominal pain   13. Chronic neck pain (Bilateral) (L>R)     Problems updated and reviewed during this visit: Problem  Lumbar facet syndrome (Bilateral) (L>R)  Opiate use (60 MME/Day)   Problem-specific Plan(s): No problem-specific Assessment & Plan notes found for this  encounter.  No new Assessment & Plan notes have been filed under this hospital service since the last note was generated. Service: Pain Management  Plan of Care   Problem List Items Addressed This Visit      High   Chronic abdominal pain (Location of Primary Source of Pain) (Bilateral) (L>R) (Chronic)   Relevant Medications   oxyCODONE (OXY IR/ROXICODONE) 5 MG immediate release tablet   Other Relevant Orders   CELIAC PLEXUS BLOCK   Chronic hip pain (Bilateral) (L>R) (  Chronic)   Relevant Medications   oxyCODONE (OXY IR/ROXICODONE) 5 MG immediate release tablet   Other Relevant Orders   HIP INJECTION   Chronic knee pain (Bilateral) (L>R) (Chronic)   Relevant Medications   oxyCODONE (OXY IR/ROXICODONE) 5 MG immediate release tablet   Other Relevant Orders   KNEE INJECTION   Chronic low back pain (Location of Secondary source of pain) (Bilateral) (L>R) (Chronic)   Relevant Medications   oxyCODONE (OXY IR/ROXICODONE) 5 MG immediate release tablet   Other Relevant Orders   LUMBAR FACET(MEDIAL BRANCH NERVE BLOCK) MBNB   Chronic lower extremity pain (Location of Tertiary source of pain) (Bilateral) (R>L) (Chronic)   Chronic neck pain (Bilateral) (L>R) (Chronic)   Relevant Medications   oxyCODONE (OXY IR/ROXICODONE) 5 MG immediate release tablet   Other Relevant Orders   CERVICAL FACET (MEDIAL BRANCH NERVE BLOCK)    Chronic pain - Primary (Chronic)   Relevant Medications   oxyCODONE (OXY IR/ROXICODONE) 5 MG immediate release tablet   Other Relevant Orders   ToxASSURE Select 13 (MW), Urine   C-reactive protein   Sedimentation rate   Magnesium   Vitamin B12   25-Hydroxyvitamin D Lcms D2+D3   Diabetic peripheral neuropathy (HCC) (Chronic)   Lumbar spondylosis (Chronic)   Relevant Medications   oxyCODONE (OXY IR/ROXICODONE) 5 MG immediate release tablet   Other Relevant Orders   LUMBAR FACET(MEDIAL BRANCH NERVE BLOCK) MBNB   Visceral abdominal pain (Chronic)     Medium    Encounter for therapeutic drug level monitoring   Long term current use of opiate analgesic (Chronic)   Relevant Orders   ToxASSURE Select 13 (MW), Urine   Opiate use (60 MME/Day) (Chronic)    Other Visit Diagnoses   None.    Pharmacotherapy (Medications Ordered): Meds ordered this encounter  Medications  . oxyCODONE (OXY IR/ROXICODONE) 5 MG immediate release tablet    Sig: Take 1 tablet (5 mg total) by mouth every 8 (eight) hours as needed for severe pain.    Dispense:  90 tablet    Refill:  0    Do not place this medication, or any other prescription from our practice, on "Automatic Refill". Patient may have prescription filled one day early if pharmacy is closed on scheduled refill date. Do not fill until: 11/07/15 To last until: 12/07/15   [facility] & Procedure Ordered: Orders Placed This Encounter  Procedures  . CERVICAL FACET (MEDIAL BRANCH NERVE BLOCK)   . LUMBAR FACET(MEDIAL BRANCH NERVE BLOCK) MBNB  . HIP INJECTION  . KNEE INJECTION  . CELIAC PLEXUS BLOCK  . ToxASSURE Select 13 (MW), Urine  . C-reactive protein  . Sedimentation rate  . Magnesium  . Vitamin B12  . 25-Hydroxyvitamin D Lcms D2+D3   Imaging Ordered: None  Interventional Therapies: Scheduled:  Diagnostic bilateral lumbar facet block under fluoroscopic guidance and IV sedation.    Considering:   Diagnostic bilateral lumbar facet block under fluoroscopic guidance and IV sedation.  Possible bilateral lumbar facet rate frequency ablation.  Diagnostic bilateral celiac plexus block under fluoroscopic guidance and IV sedation.  Diagnostic bilateral intra-articular hip injection under fluoroscopic guidance, with or without sedation.  Possible diagnostic bilateral femoral nerve and obturator nerve articular branch blocks under fluoroscopic guidance and IV sedation.  Possible bilateral femoral nerve and obturator nerve articular branch frequency ablation.  Diagnostic bilateral intra-articular knee joint  injection under fluoroscopic guidance, no sedation.  Possible series of 5 bilateral intra-articular Hyalgan knee injections.  Bilateral diagnostic genicular nerve block under fluoroscopic guidance,  with or without sedation.  Possible bilateral genicular nerve radiofrequency ablation under fluoroscopic guidance and IV sedation.    PRN Procedures:   Diagnostic bilateral lumbar facet block under fluoroscopic guidance and IV sedation.  Diagnostic bilateral celiac plexus block under fluoroscopic guidance and IV sedation.  Diagnostic bilateral intra-articular hip injection under fluoroscopic guidance, with or without sedation.  Possible diagnostic bilateral femoral nerve and obturator nerve articular branch blocks under fluoroscopic guidance and IV sedation.  Diagnostic bilateral intra-articular knee joint injection under fluoroscopic guidance, no sedation.  Bilateral diagnostic genicular nerve block under fluoroscopic guidance, with or without sedation.    Referral(s) or Consult(s): None at this time.  New Prescriptions   No medications on file    Medications administered during this visit: [name] had no medications administered during this visit.  Requested PM Follow-up: Return in 4 weeks (on 12/04/2015) for Med-Mgmt.  Future Appointments Date Time Provider [place]  12/08/2015 10:00 AM Clayburn Pert, MD [facility] None  12/11/2015 9:40 AM Milinda Pointer, MD [facility] None    Primary Care Physician: Adin Hector, MD Location: [facility] Outpatient Pain Management Facility Note by: Kathlen [place]. Dossie Arbour, M.D, DABA, DABAPM, DABPM, DABIPP, FIPP  Pain Score Disclaimer: We use the NRS-11 scale. This is a self-reported, subjective measurement of pain severity with only modest accuracy. It is used primarily to identify changes within a particular patient. It must be understood that outpatient pain scales are significantly less accurate that those used for research, where they can  be applied under ideal controlled circumstances with minimal exposure to variables. In reality, the score is likely to be a combination of pain intensity and pain affect, where pain affect describes the degree of emotional arousal or changes in action readiness caused by the sensory experience of pain. Factors such as social and work situation, setting, emotional state, anxiety levels, expectation, and prior pain experience may influence pain perception and show large inter-individual differences that may also be affected by time variables.  Patient instructions provided during this appointment: Patient Instructions  Instructed to get bloodwork done asap in the medical mall

## 2015-11-07 NOTE — Progress Notes (Signed)
Safety precautions to be maintained throughout the outpatient stay will include: orient to surroundings, keep bed in low position, maintain call bell within reach at all times, provide assistance with transfer out of bed and ambulation.  Did not bring pills for pill count.  Reminded to bring to all appointments.

## 2015-11-07 NOTE — Patient Instructions (Signed)
Instructed to get bloodwork done asap in the medical mall

## 2015-11-10 LAB — 25-HYDROXYVITAMIN D LCMS D2+D3
25-HYDROXY, VITAMIN D-2: 38 ng/mL
25-HYDROXY, VITAMIN D: 43 ng/mL

## 2015-11-10 LAB — 25-HYDROXY VITAMIN D LCMS D2+D3: 25-Hydroxy, Vitamin D-3: 5.4 ng/mL

## 2015-11-16 DIAGNOSIS — G629 Polyneuropathy, unspecified: Secondary | ICD-10-CM | POA: Insufficient documentation

## 2015-11-16 LAB — TOXASSURE SELECT 13 (MW), URINE

## 2015-11-20 ENCOUNTER — Telehealth: Payer: Self-pay

## 2015-11-20 NOTE — Telephone Encounter (Signed)
Patient has an appointment with Dr. Adonis Huguenin on 12/08/2015. She is having pain in her stomach and wants to see if she can come in sooner. She is still going to a pain management facility and she is taking her pain medication. I spoke with Amber but she is in clinical right now. She will get in touch with the patient when she can to see what she can do.

## 2015-11-21 NOTE — Telephone Encounter (Signed)
Returned phone call to patient. Patient was not accepting calls and voicemail could not be left for patient. Will try again later.

## 2015-11-21 NOTE — Telephone Encounter (Signed)
Returned phone call once again to patient. No answer. Unable to leave voicemail. I will be glad to speak with patient should she call back.

## 2015-11-23 ENCOUNTER — Telehealth: Payer: Self-pay | Admitting: General Surgery

## 2015-11-23 NOTE — Telephone Encounter (Signed)
Wound on stomach is irritated, little blood on bandage, red around wound. It doesn't hurt and no fever.

## 2015-11-24 NOTE — Telephone Encounter (Signed)
Returned phone call to patient at this time. She states that she feels that this area is less red and irritated since she has cleaned this and kept it more dry for the last 24 hours. Patient denied appointment for Monday to be seen by another surgeon. She requested to move her appointment up to the 18th with Dr. Adonis Huguenin and this was done but I have told the patient that if this area continues to be red or she has any concern what so ever, she will need to call me so that I can work her in. Patient agrees with this plan.

## 2015-12-06 ENCOUNTER — Ambulatory Visit (INDEPENDENT_AMBULATORY_CARE_PROVIDER_SITE_OTHER): Payer: PPO | Admitting: General Surgery

## 2015-12-06 ENCOUNTER — Encounter: Payer: Self-pay | Admitting: General Surgery

## 2015-12-06 VITALS — BP 169/101 | HR 77 | Temp 98.2°F | Ht 63.0 in | Wt 210.0 lb

## 2015-12-06 DIAGNOSIS — IMO0002 Reserved for concepts with insufficient information to code with codable children: Secondary | ICD-10-CM

## 2015-12-06 DIAGNOSIS — Z9889 Other specified postprocedural states: Secondary | ICD-10-CM

## 2015-12-06 NOTE — Patient Instructions (Addendum)
Please make an appointment with your primary care physician to discuss your high blood pressure.  Call us if you have questions or concerns

## 2015-12-06 NOTE — Progress Notes (Signed)
Outpatient Surgical Follow Up  12/06/2015  [name] is an 65 y.o. female.   Chief Complaint  Patient presents with  . Follow-up    Colostomy Takedown 07/12/2015 Dr. Adonis Huguenin    HPI: 65 year old female returns to clinic for follow-up primarily for her midline wound from a colostomy takedown that was performed almost 5 months ago. She reports she is eating well and having normal bowel function. She continues to have her chronic pain but has been established of the pain clinic who is managing her medications to her satisfaction. She continues to have 2 areas of persistent drainage from her midline wound. These have actually improved over the last 2 weeks but are worse than her previous visit on month ago. She is been having some serous drainage from both sites and denies any signs of infection. She denies any fevers, chills, nausea, vomiting, chest pain, shortness breath, diarrhea, constipation. Otherwise she is doing very well.  Past Medical History:  Diagnosis Date  . Anemia   . Anxiety   . Aortic valve stenosis   . Arthritis    feet, legs  . Bowel obstruction   . CKD (chronic kidney disease)    protein in urine  . Colostomy in place [facility])   . Diabetes mellitus without complication ([place])   . Diverticulitis large intestine   . Dysrhythmia   . Ectopic atrial tachycardia ([place])   . Gastritis   . GI bleed   . Heart murmur   . Hyperlipidemia   . Hypertension   . Hypothyroidism   . MRSA (methicillin resistant Staphylococcus aureus)    at abdominal wound.  Jan 2017.  Treated.   . Neuropathy ([place])   . Neuropathy in diabetes ([place])   . Obesity   . Shortness of breath dyspnea   . Vertigo     Past Surgical History:  Procedure Laterality Date  . ABDOMINAL HYSTERECTOMY    . APPLICATION OF WOUND VAC N/A 02/24/2015   Procedure: APPLICATION OF WOUND VAC;  Surgeon: Clayburn Pert, MD;  Location: [facility];  Service: General;  Laterality: N/A;  . APPLICATION OF WOUND VAC N/A 02/26/2015    Procedure: APPLICATION OF WOUND VAC;  Surgeon: Clayburn Pert, MD;  Location: [facility];  Service: General;  Laterality: N/A;  . BACK SURGERY     spur frmoved from lower back  . CARDIAC CATHETERIZATION    . COLECTOMY    . COLOSTOMY REVERSAL N/A 07/12/2015   Procedure: COLOSTOMY REVERSAL;  Surgeon: Clayburn Pert, MD;  Location: [facility];  Service: General;  Laterality: N/A;  . DEBRIDEMENT OF ABDOMINAL WALL ABSCESS N/A 02/22/2015   Procedure: DEBRIDEMENT OF ABDOMINAL WALL ABSCESS;  Surgeon: Clayburn Pert, MD;  Location: [facility];  Service: General;  Laterality: N/A;  . EXCISION MASS ABDOMINAL N/A 02/24/2015   Procedure: EXCISION MASS ABDOMINAL  / [place];  Surgeon: Clayburn Pert, MD;  Location: [facility];  Service: General;  Laterality: N/A;  . EXCISION MASS ABDOMINAL N/A 02/26/2015   Procedure: EXCISION MASS ABDOMINAL/wash out;  Surgeon: Clayburn Pert, MD;  Location: [facility];  Service: General;  Laterality: N/A;  . FLEXIBLE SIGMOIDOSCOPY  06/19/2015   Procedure: FLEXIBLE SIGMOIDOSCOPY;  Surgeon: Lucilla Lame, MD;  Location: [address];  Service: Endoscopy;;  UNABLE TO ACCESS OSTOMY SITE FOR ACCESS INTO COLON  . LAPAROSCOPY  07/12/2015   Procedure: LAPAROSCOPY DIAGNOSTIC;  Surgeon: Clayburn Pert, MD;  Location: [facility];  Service: General;;  . LAPAROTOMY  07/12/2015   Procedure: EXPLORATORY LAPAROTOMY;  Surgeon:  Clayburn Pert, MD;  Location: [facility];  Service: General;;  . LAPAROTOMY N/A 02/06/2015   Procedure: Laparotomy, reduction of incarcerated parastomal hernia, repair of parastomal hernia with mesh;  Surgeon: Sherri Rad, MD;  Location: [facility];  Service: General;  Laterality: N/A;  . laparotomy closure of cecal perforation  05/09/2013   Dr. Marina Gravel  . LYSIS OF ADHESION  07/12/2015   Procedure: LYSIS OF ADHESION;  Surgeon: Clayburn Pert, MD;  Location: [facility];  Service: General;;  . TONSILLECTOMY    . WOUND DEBRIDEMENT N/A 03/03/2015   Procedure: DEBRIDEMENT ABDOMINAL  WOUND;  Surgeon: Florene Glen, MD;  Location: [facility];  Service: General;  Laterality: N/A;  . WOUND DEBRIDEMENT N/A 03/09/2015   Procedure: DEBRIDEMENT ABDOMINAL WOUND;  Surgeon: Florene Glen, MD;  Location: [facility];  Service: General;  Laterality: N/A;    Family History  Problem Relation Age of Onset  . Diabetes Mother   . Hypertension Father     Social History:  reports that she quit smoking about 2 years ago. Her smoking use included Cigarettes. She has a 0.25 pack-year smoking history. She has never used smokeless tobacco. She reports that she does not drink alcohol or use drugs.  Allergies:  Allergies  Allergen Reactions  . Buspirone Other (See Comments)    Weakness  . Citalopram Other (See Comments)  . Lisinopril Cough  . Metformin Diarrhea    At 1000 mg dose  . Pravastatin Other (See Comments)    insomnia  . Sitagliptin Other (See Comments)    constipation  . Tramadol Itching  . Diltiazem Hcl Palpitations  . Gabapentin Palpitations  . Hydralazine Rash  . Lovastatin Palpitations    Medications reviewed.    ROS A multipoint review of systems was completed, all pertinent positives and negatives are documented within the history of present illness and remainder are negative.   BP (!) 169/101 (BP Location: Left Arm, Patient Position: Sitting)   Pulse 77   Temp 98.2 F (36.8 C) (Oral)   Ht 5\' 3"  (1.6 m)   Wt 95.3 kg (210 lb)   BMI 37.20 kg/m   Physical Exam Gen.: No acute distress Chest: Clear to auscultation Heart: Regular rate and rhythm Abdomen: Soft, nontender, nondistended. Well-healed prior colostomy site in the left upper quadrant. Midline wound with chronic changes. There is a 3 mm opening near that most cephalad aspect of the incision. There is a 2 mm opening in the middle of the incision where her umbilicus used to be.    No results found for this or any previous visit (from the past 48 hour(s)). No results  found.  Assessment/Plan:  1. 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 Areas of concern that were open treated with silver nitrate today. Discussed again the signs and symptoms of infection and return to clinic medially should they occur. Otherwise she'll follow-up in clinic again in 2 weeks for additional wound check. It is likely that the most cephalad aspect is related to a suture granuloma or some other foreign body reaction that should eventually heal. The umbilical wound is almost healed in comparison to previous visits. Patient voiced understanding we'll follow-up in clinic in 2 weeks.     Clayburn Pert, MD FACS General Surgeon  12/06/2015,11:24 AM

## 2015-12-08 ENCOUNTER — Ambulatory Visit: Payer: Self-pay | Admitting: General Surgery

## 2015-12-11 ENCOUNTER — Ambulatory Visit: Payer: PPO | Attending: Pain Medicine | Admitting: Pain Medicine

## 2015-12-11 ENCOUNTER — Encounter: Payer: Self-pay | Admitting: Pain Medicine

## 2015-12-11 VITALS — BP 149/78 | HR 75 | Temp 98.6°F | Ht 63.0 in | Wt 203.0 lb

## 2015-12-11 DIAGNOSIS — I471 Supraventricular tachycardia: Secondary | ICD-10-CM | POA: Diagnosis not present

## 2015-12-11 DIAGNOSIS — G894 Chronic pain syndrome: Secondary | ICD-10-CM | POA: Insufficient documentation

## 2015-12-11 DIAGNOSIS — D649 Anemia, unspecified: Secondary | ICD-10-CM | POA: Insufficient documentation

## 2015-12-11 DIAGNOSIS — M47896 Other spondylosis, lumbar region: Secondary | ICD-10-CM | POA: Insufficient documentation

## 2015-12-11 DIAGNOSIS — L409 Psoriasis, unspecified: Secondary | ICD-10-CM | POA: Insufficient documentation

## 2015-12-11 DIAGNOSIS — E538 Deficiency of other specified B group vitamins: Secondary | ICD-10-CM | POA: Insufficient documentation

## 2015-12-11 DIAGNOSIS — I129 Hypertensive chronic kidney disease with stage 1 through stage 4 chronic kidney disease, or unspecified chronic kidney disease: Secondary | ICD-10-CM | POA: Insufficient documentation

## 2015-12-11 DIAGNOSIS — E1122 Type 2 diabetes mellitus with diabetic chronic kidney disease: Secondary | ICD-10-CM | POA: Insufficient documentation

## 2015-12-11 DIAGNOSIS — F339 Major depressive disorder, recurrent, unspecified: Secondary | ICD-10-CM | POA: Insufficient documentation

## 2015-12-11 DIAGNOSIS — Z79899 Other long term (current) drug therapy: Secondary | ICD-10-CM | POA: Insufficient documentation

## 2015-12-11 DIAGNOSIS — Z794 Long term (current) use of insulin: Secondary | ICD-10-CM | POA: Diagnosis not present

## 2015-12-11 DIAGNOSIS — E118 Type 2 diabetes mellitus with unspecified complications: Secondary | ICD-10-CM | POA: Diagnosis not present

## 2015-12-11 DIAGNOSIS — M25551 Pain in right hip: Secondary | ICD-10-CM | POA: Insufficient documentation

## 2015-12-11 DIAGNOSIS — N183 Chronic kidney disease, stage 3 (moderate): Secondary | ICD-10-CM | POA: Diagnosis not present

## 2015-12-11 DIAGNOSIS — E669 Obesity, unspecified: Secondary | ICD-10-CM | POA: Insufficient documentation

## 2015-12-11 DIAGNOSIS — Z888 Allergy status to other drugs, medicaments and biological substances status: Secondary | ICD-10-CM | POA: Diagnosis not present

## 2015-12-11 DIAGNOSIS — Z9049 Acquired absence of other specified parts of digestive tract: Secondary | ICD-10-CM | POA: Diagnosis not present

## 2015-12-11 DIAGNOSIS — R109 Unspecified abdominal pain: Secondary | ICD-10-CM | POA: Insufficient documentation

## 2015-12-11 DIAGNOSIS — M47816 Spondylosis without myelopathy or radiculopathy, lumbar region: Secondary | ICD-10-CM

## 2015-12-11 DIAGNOSIS — E034 Atrophy of thyroid (acquired): Secondary | ICD-10-CM | POA: Diagnosis not present

## 2015-12-11 DIAGNOSIS — M1288 Other specific arthropathies, not elsewhere classified, other specified site: Secondary | ICD-10-CM | POA: Diagnosis not present

## 2015-12-11 DIAGNOSIS — K5792 Diverticulitis of intestine, part unspecified, without perforation or abscess without bleeding: Secondary | ICD-10-CM | POA: Insufficient documentation

## 2015-12-11 DIAGNOSIS — M1990 Unspecified osteoarthritis, unspecified site: Secondary | ICD-10-CM | POA: Diagnosis not present

## 2015-12-11 DIAGNOSIS — Z79891 Long term (current) use of opiate analgesic: Secondary | ICD-10-CM | POA: Insufficient documentation

## 2015-12-11 DIAGNOSIS — Z9889 Other specified postprocedural states: Secondary | ICD-10-CM | POA: Insufficient documentation

## 2015-12-11 DIAGNOSIS — M545 Low back pain: Secondary | ICD-10-CM | POA: Insufficient documentation

## 2015-12-11 DIAGNOSIS — E1140 Type 2 diabetes mellitus with diabetic neuropathy, unspecified: Secondary | ICD-10-CM | POA: Insufficient documentation

## 2015-12-11 MED ORDER — OXYCODONE HCL 5 MG PO TABS: 5.0000 mg | ORAL_TABLET | Freq: Three times a day (TID) | ORAL | 0 refills | Status: DC | PRN

## 2015-12-11 NOTE — Progress Notes (Signed)
Nursing Pain Medication Assessment:  Safety precautions to be maintained throughout the outpatient stay will include: orient to surroundings, keep bed in low position, maintain call bell within reach at all times, provide assistance with transfer out of bed and ambulation.  Medication Inspection Compliance: Pill count conducted under aseptic conditions, in front of the patient. Neither the pills nor the bottle was removed from the patient's sight at any time. Once count was completed pills were immediately returned to the patient in their original bottle. Pill Count: 7 of 90 pills remain Bottle Appearance: Standard pharmacy container. Clearly labeled. Medication: Oxycodone IR Filled Date: 09 / 20 / 2017

## 2015-12-11 NOTE — Progress Notes (Signed)
Patient's Name: [name]  MRN: [id number]  Referring Provider: Adin Hector, MD  DOB: 02-15-1951  PCP: Adin Hector, MD  DOS: 12/11/2015  Note by: Kathlen Brunswick. Dossie Arbour, MD  Service setting: Ambulatory outpatient  Specialty: Interventional Pain Management  Location: [facility] (AMB) Pain Management Facility    Patient type: Established   Primary Reason(s) for Visit: Encounter for prescription drug management (Level of risk: moderate) CC: Hip Pain (right )  HPI  [name] is a 65 y.o. year old, female patient, who comes today for an initial evaluation. She has Obesity; Small bowel obstruction; Absolute anemia; Aortic heart valve narrowing; Arthritis; B12 deficiency; Benign essential HTN; Chronic kidney disease (CKD), stage III (moderate); Diverticulitis; Focal and segmental hyalinosis; Bleeding gastrointestinal; Acquired atrophy of thyroid; Abnormal presence of protein in urine; Psoriasis; Paroxysmal supraventricular tachycardia ([place]); Breathlessness on exertion; Type 2 diabetes mellitus ([place]); Preop examination; Chronic pain; Chronic abdominal pain (Location of Primary Source of Pain) (Bilateral) (L>R); Long term current use of opiate analgesic; Long term prescription opiate use; Opiate use (60 MME/Day); Encounter for therapeutic drug level monitoring; Encounter for pain management planning; Neurogenic pain; Musculoskeletal pain; Visceral abdominal pain; Diabetic peripheral neuropathy ([place]); Chronic low back pain (Location of Secondary source of pain) (Bilateral) (L>R); Chronic lower extremity pain (Location of Tertiary source of pain) (Bilateral) (R>L); Chronic hip pain (Bilateral) (L>R); Chronic knee pain (Bilateral) (L>R); Chronic neck pain (Bilateral) (L>R); H/O colostomy; Diabetic neuropathy ([place]); Recurrent major depressive disorder, in full remission ([place]); Lumbar spondylosis; Lumbar facet syndrome (Bilateral) (L>R); and Polyneuropathy (HCC) on her problem list.. Her primarily concern today is  the Hip Pain (right )  Pain Assessment: Self-Reported Pain Score: 4 /10 Clinically the patient looks like a 2/10 Reported level is inconsistent with clinical observations. Information on the proper use of the pain score provided to the patient today. Pain Type: Chronic pain Pain Location: Hip Pain Orientation: Right Pain Descriptors / Indicators: Aching Pain Frequency: Constant  Ms. Lewis was last seen on 11/07/2015 for medication management. During today's appointment we reviewed [name]'s chronic pain status, as well as her outpatient medication regimen.  The patient  reports that she does not use drugs. Her body mass index is 65.96 kg/m.  Further details on both, my assessment(s), as well as the proposed treatment plan, please see below.  Controlled Substance Pharmacotherapy Assessment REMS (Risk Evaluation and Mitigation Strategy)  Analgesic:Oxycodone IR '5mg'$  every 8hours ('15mg'$ /day) MME/day:22.5 mg/day  Ubaldo Glassing, RN  12/11/2015  9:27 AM  Signed Nursing Pain Medication Assessment:  Safety precautions to be maintained throughout the outpatient stay will include: orient to surroundings, keep bed in low position, maintain call bell within reach at all times, provide assistance with transfer out of bed and ambulation.  Medication Inspection Compliance: Pill count conducted under aseptic conditions, in front of the patient. Neither the pills nor the bottle was removed from the patient's sight at any time. Once count was completed pills were immediately returned to the patient in their original bottle. Pill Count: 7 of 90 pills remain Bottle Appearance: Standard pharmacy container. Clearly labeled. Medication: Oxycodone IR Filled Date: 09 / 20 / 2017 Pharmacokinetics: Liberation and absorption (onset of action): WNL Distribution (time to peak effect): WNL Metabolism and excretion (duration of action): WNL         Pharmacodynamics: Desired effects: Analgesia: The patient  reports >50% benefit. Reported improvement in function: The patient reports medication allows her to accomplish basic ADLs. Clinically meaningful improvement in  function (CMIF): Sustained CMIF goals met Perceived effectiveness: Described as relatively effective, allowing for increase in activities of daily living (ADL) Undesirable effects: Side-effects or Adverse reactions: None reported Monitoring: [place]MP: Online review of the past 52-monthperiod conducted. Compliant with practice rules and regulations List of all UDS test(s) done:  Lab Results  Component Value Date   TOXASSSELUR FINAL 11/07/2015   SUMMARY FINAL 07/03/2015   Last UDS on record: ToxAssure Select 13  Date Value Ref Range Status  11/07/2015 FINAL  Final    Comment:    ==================================================================== TOXASSURE SELECT 13 (MW) ==================================================================== Test                             Result       Flag       Units Drug Present and Declared for Prescription Verification   Oxycodone                      241          EXPECTED   ng/mg creat   Oxymorphone                    288          EXPECTED   ng/mg creat   Noroxycodone                   191          EXPECTED   ng/mg creat   Noroxymorphone                 89           EXPECTED   ng/mg creat    Sources of oxycodone are scheduled prescription medications.    Oxymorphone, noroxycodone, and noroxymorphone are expected    metabolites of oxycodone. Oxymorphone is also available as a    scheduled prescription medication. ==================================================================== Test                      Result    Flag   Units      Ref Range   Creatinine              56               mg/dL      >=20 ==================================================================== Declared Medications:  The flagging and interpretation on this report are based on the  following declared medications.   Unexpected results may arise from  inaccuracies in the declared medications.  **Note: The testing scope of this panel includes these medications:  Oxycodone  **Note: The testing scope of this panel does not include following  reported medications:  Amiodarone (Pacerone)  Amlodipine (Norvasc)  Atorvastatin (Lipitor)  Bisacodyl  Calcium Carbonate (Tums)  Clobetasol (Temovate)  Duloxetine (Cymbalta)  Gabapentin (Neurontin)  Glimepiride (Amaryl)  Insulin (Lantus)  Levothyroxine (Levothroid)  Levothyroxine (Synthroid)  Metoprolol (Toprol)  Telmisartan (Micardis)  Vitamin D2 (Drisdol) ==================================================================== For clinical consultation, please call [phone] ====================================================================    Summary  Date Value Ref Range Status  07/03/2015 FINAL  Final    Comment:    ==================================================================== TOXASSURE COMP DRUG ANALYSIS,UR ==================================================================== Test                             Result       Flag       Units  Drug Present and Declared for Prescription Verification   Duloxetine                     PRESENT      EXPECTED   Trazodone                      PRESENT      EXPECTED   1,3 chlorophenyl piperazine    PRESENT      EXPECTED    1,3-chlorophenyl piperazine is an expected metabolite of    trazodone.   Metoprolol                     PRESENT      EXPECTED Drug Present not Declared for Prescription Verification   Hydrocodone                    255          UNEXPECTED ng/mg creat   Hydromorphone                  197          UNEXPECTED ng/mg creat   Dihydrocodeine                 102          UNEXPECTED ng/mg creat   Norhydrocodone                 153          UNEXPECTED ng/mg creat    Sources of hydrocodone include scheduled prescription    medications. Hydromorphone, dihydrocodeine and norhydrocodone are     expected metabolites of hydrocodone. Hydromorphone and    dihydrocodeine are also available as scheduled prescription    medications. ==================================================================== Test                      Result    Flag   Units      Ref Range   Creatinine              58               mg/dL      >=20 ==================================================================== Declared Medications:  The flagging and interpretation on this report are based on the  following declared medications.  Unexpected results may arise from  inaccuracies in the declared medications.  **Note: The testing scope of this panel includes these medications:  Duloxetine (Cymbalta)  Metoprolol (Toprol)  Trazodone (Desyrel)  **Note: The testing scope of this panel does not include following  reported medications:  Atorvastatin (Lipitor)  Glimepiride (Amaryl)  Insulin (Lantus)  Levothyroxine  Telmisartan (Micardis) ==================================================================== For clinical consultation, please call [phone]. ====================================================================    UDS interpretation: Compliant          Medication Assessment Form: Reviewed. Patient indicates being compliant with therapy Treatment compliance: Compliant Risk Assessment Profile: Aberrant behavior: See prior evaluations. None observed or detected today Comorbid factors increasing risk of overdose: See prior notes. No additional risks detected today Risk of substance use disorder (SUD): Low Opioid Risk Tool (ORT) Total Score: 0  Interpretation Table:  Score <3 = Low Risk for SUD  Score between 4-7 = Moderate Risk for SUD  Score >8 = High Risk for Opioid Abuse   Risk Mitigation Strategies:  Patient Counseling:  Covered Patient-Prescriber Agreement (PPA): Present and active  Notification to other healthcare providers: Done  Pharmacologic Plan: No change in  therapy, at this  time  Laboratory Chemistry  Inflammation Markers Lab Results  Component Value Date   ESRSEDRATE 16 11/07/2015   CRP 0.6 11/07/2015   Renal Function Lab Results  Component Value Date   BUN 33 (H) 07/22/2015   CREATININE 2.33 (H) 07/22/2015   GFRAA 24 (L) 07/22/2015   GFRNONAA 21 (L) 07/22/2015   Hepatic Function Lab Results  Component Value Date   AST 14 (L) 02/22/2015   ALT 13 (L) 02/22/2015   ALBUMIN 2.5 (L) 07/21/2015   Electrolytes Lab Results  Component Value Date   NA 138 07/22/2015   K 4.2 07/22/2015   CL 110 07/22/2015   CALCIUM 7.6 (L) 07/22/2015   MG 2.2 11/07/2015   Pain Modulating Vitamins Lab Results  Component Value Date   25OHVITD1 43 11/07/2015   25OHVITD2 38 11/07/2015   25OHVITD3 5.4 11/07/2015   VITAMINB12 273 11/07/2015   Coagulation Parameters Lab Results  Component Value Date   INR 1.28 02/22/2015   LABPROT 16.1 (H) 02/22/2015   APTT 36 02/22/2015   PLT 315 07/22/2015   Cardiovascular Lab Results  Component Value Date   HGB 8.9 (L) 07/22/2015   HCT 28.0 (L) 07/22/2015   Note: Lab results reviewed.  Recent Diagnostic Imaging Review  No results found. Note: Imaging results reviewed.  Meds  The patient has a current medication list which includes the following prescription(s): amlodipine, atorvastatin, calcium carbonate, clobetasol cream, duloxetine, vitamin d2, gabapentin, glimepiride, glucose blood, insulin glargine, pen needles 31gx5/16", lantus solostar, levothyroxine, metoprolol succinate, onetouch delica lancets 32K, oxycodone, oxycodone, oxycodone, telmisartan, and vitamin d (ergocalciferol).  Current Outpatient Prescriptions on File Prior to Visit  Medication Sig  . amLODipine (NORVASC) 5 MG tablet Take 1 tablet (5 mg total) by mouth daily.  Marland Kitchen atorvastatin (LIPITOR) 40 MG tablet Take by mouth.  . calcium carbonate (TUMS - DOSED IN MG ELEMENTAL CALCIUM) 500 MG chewable tablet Chew 1 tablet by mouth as needed for indigestion  or heartburn.  . clobetasol cream (TEMOVATE) 0.05 % APPLY TO AFFECTED NON-FACIAL AREAS TWICE DAILY UNTIL CLEAR  . DULoxetine (CYMBALTA) 30 MG capsule Take by mouth.  . gabapentin (NEURONTIN) 300 MG capsule Take 1 capsule by mouth daily.  Marland Kitchen glimepiride (AMARYL) 4 MG tablet Take 4 mg by mouth 2 (two) times daily. 1 tab AM, 1/2 tab PM  . glucose blood (ONE TOUCH ULTRA TEST) test strip TEST TWICE DAILY  . insulin glargine (LANTUS) 100 UNIT/ML injection Inject 8 Units into the skin at bedtime.   . Insulin Pen Needle (PEN NEEDLES 31GX5/16") 31G X 8 MM MISC Use as directed. Once daily  . LANTUS SOLOSTAR 100 UNIT/ML Solostar Pen Inject 8 Units into the skin at bedtime.   Marland Kitchen levothyroxine (SYNTHROID, LEVOTHROID) 88 MCG tablet TAKE 1 TABLET BY MOUTH ONCE A DAY FOR THYROID  . metoprolol succinate (TOPROL-XL) 25 MG 24 hr tablet TAKE 1 TABLET BY MOUTH ONCE A DAY  . ONETOUCH DELICA LANCETS 02R MISC TEST TWICE DAILY  . telmisartan (MICARDIS) 80 MG tablet TAKE 1 TABLET BY MOUTH ONCE A DAY  . Vitamin D, Ergocalciferol, (DRISDOL) 50000 units CAPS capsule Take by mouth.   No current facility-administered medications on file prior to visit.    ROS  Constitutional: Denies any fever or chills Gastrointestinal: No reported hemesis, hematochezia, vomiting, or acute GI distress Musculoskeletal: Denies any acute onset joint swelling, redness, loss of ROM, or weakness Neurological: No reported episodes of acute onset apraxia, aphasia, dysarthria, agnosia, amnesia, paralysis, loss  of coordination, or loss of consciousness  Allergies  Ms. Ressler is allergic to buspirone; citalopram; lisinopril; metformin; pravastatin; sitagliptin; tramadol; diltiazem hcl; gabapentin; hydralazine; and lovastatin.  [place]  Drug: Ms. Hirt  reports that she does not use drugs. Alcohol:  reports that she does not drink alcohol. Tobacco:  reports that she quit smoking about 2 years ago. Her smoking use included Cigarettes. She has never used  smokeless tobacco. Medical:  has a past medical history of Anemia; Anxiety; Aortic valve stenosis; Arthritis; Bowel obstruction; CKD (chronic kidney disease); Colostomy in place [facility]); Diabetes mellitus without complication ([place]); Diverticulitis large intestine; Dysrhythmia; Ectopic atrial tachycardia ([place]); Gastritis; GI bleed; Heart murmur; Hyperlipidemia; Hypertension; Hypothyroidism; MRSA (methicillin resistant Staphylococcus aureus); Neuropathy ([place]); Neuropathy in diabetes ([place]); Obesity; Shortness of breath dyspnea; and Vertigo. Family: family history includes Diabetes in her mother; Hypertension in her father.  Past Surgical History:  Procedure Laterality Date  . ABDOMINAL HYSTERECTOMY    . APPLICATION OF WOUND VAC N/A 02/24/2015   Procedure: APPLICATION OF WOUND VAC;  Surgeon: Clayburn Pert, MD;  Location: [facility];  Service: General;  Laterality: N/A;  . APPLICATION OF WOUND VAC N/A 02/26/2015   Procedure: APPLICATION OF WOUND VAC;  Surgeon: Clayburn Pert, MD;  Location: [facility];  Service: General;  Laterality: N/A;  . BACK SURGERY     spur frmoved from lower back  . CARDIAC CATHETERIZATION    . COLECTOMY    . COLOSTOMY REVERSAL N/A 07/12/2015   Procedure: COLOSTOMY REVERSAL;  Surgeon: Clayburn Pert, MD;  Location: [facility];  Service: General;  Laterality: N/A;  . DEBRIDEMENT OF ABDOMINAL WALL ABSCESS N/A 02/22/2015   Procedure: DEBRIDEMENT OF ABDOMINAL WALL ABSCESS;  Surgeon: Clayburn Pert, MD;  Location: [facility];  Service: General;  Laterality: N/A;  . EXCISION MASS ABDOMINAL N/A 02/24/2015   Procedure: EXCISION MASS ABDOMINAL  / [place];  Surgeon: Clayburn Pert, MD;  Location: [facility];  Service: General;  Laterality: N/A;  . EXCISION MASS ABDOMINAL N/A 02/26/2015   Procedure: EXCISION MASS ABDOMINAL/wash out;  Surgeon: Clayburn Pert, MD;  Location: [facility];  Service: General;  Laterality: N/A;  . FLEXIBLE SIGMOIDOSCOPY  06/19/2015   Procedure: FLEXIBLE SIGMOIDOSCOPY;   Surgeon: Lucilla Lame, MD;  Location: [place];  Service: Endoscopy;;  UNABLE TO ACCESS OSTOMY SITE FOR ACCESS INTO COLON  . LAPAROSCOPY  07/12/2015   Procedure: LAPAROSCOPY DIAGNOSTIC;  Surgeon: Clayburn Pert, MD;  Location: [facility];  Service: General;;  . LAPAROTOMY  07/12/2015   Procedure: EXPLORATORY LAPAROTOMY;  Surgeon: Clayburn Pert, MD;  Location: [facility];  Service: General;;  . LAPAROTOMY N/A 02/06/2015   Procedure: Laparotomy, reduction of incarcerated parastomal hernia, repair of parastomal hernia with mesh;  Surgeon: Sherri Rad, MD;  Location: [facility];  Service: General;  Laterality: N/A;  . laparotomy closure of cecal perforation  05/09/2013   Dr. Marina Gravel  . LYSIS OF ADHESION  07/12/2015   Procedure: LYSIS OF ADHESION;  Surgeon: Clayburn Pert, MD;  Location: [facility];  Service: General;;  . TONSILLECTOMY    . WOUND DEBRIDEMENT N/A 03/03/2015   Procedure: DEBRIDEMENT ABDOMINAL WOUND;  Surgeon: Florene Glen, MD;  Location: [facility];  Service: General;  Laterality: N/A;  . WOUND DEBRIDEMENT N/A 03/09/2015   Procedure: DEBRIDEMENT ABDOMINAL WOUND;  Surgeon: Florene Glen, MD;  Location: [facility];  Service: General;  Laterality: N/A;   Constitutional Exam  General appearance: Well nourished, well developed, and well hydrated. In no apparent acute distress Vitals:  12/11/15 0920  BP: (!) 149/78  Pulse: 75  Temp: 98.6 F (37 C)  SpO2: 95%  Weight: 203 lb (92.1 kg)  Height: '5\' 3"'$  (1.6 m)   BMI Assessment: Estimated body mass index is 35.96 kg/m as calculated from the following:   Height as of this encounter: '5\' 3"'$  (1.6 m).   Weight as of this encounter: 203 lb (92.1 kg).  BMI interpretation table: BMI level Category Range association with higher incidence of chronic pain  <18 kg/m2 Underweight   18.5-24.9 kg/m2 Ideal body weight   25-29.9 kg/m2 Overweight Increased incidence by 20%  30-34.9 kg/m2 Obese (Class I) Increased incidence by 68%  35-39.9 kg/m2  Severe obesity (Class II) Increased incidence by 136%  >40 kg/m2 Extreme obesity (Class III) Increased incidence by 254%   BMI Readings from Last 4 Encounters:  12/11/15 35.96 kg/m  12/06/15 37.20 kg/m  11/07/15 35.96 kg/m  11/02/15 35.96 kg/m   Wt Readings from Last 4 Encounters:  12/11/15 203 lb (92.1 kg)  12/06/15 210 lb (95.3 kg)  11/07/15 203 lb (92.1 kg)  11/02/15 203 lb (92.1 kg)  Psych/Mental status: Alert, oriented x 3 (person, place, & time) Eyes: PERLA Respiratory: No evidence of acute respiratory distress  Cervical Spine Exam  Inspection: No masses, redness, or swelling Alignment: Symmetrical Functional ROM: Unrestricted ROM Stability: No instability detected Muscle strength & Tone: Functionally intact Sensory: Unimpaired Palpation: Non-contributory  Upper Extremity (UE) Exam    Side: Right upper extremity  Side: Left upper extremity  Inspection: No masses, redness, swelling, or asymmetry  Inspection: No masses, redness, swelling, or asymmetry  Functional ROM: Unrestricted ROM         Functional ROM: Unrestricted ROM          Muscle strength & Tone: Functionally intact  Muscle strength & Tone: Functionally intact  Sensory: Unimpaired  Sensory: Unimpaired  Palpation: Non-contributory  Palpation: Non-contributory   Thoracic Spine Exam  Inspection: No masses, redness, or swelling Alignment: Symmetrical Functional ROM: Unrestricted ROM Stability: No instability detected Sensory: Unimpaired Muscle strength & Tone: Functionally intact Palpation: Non-contributory  Lumbar Spine Exam  Inspection: No masses, redness, or swelling Alignment: Symmetrical Functional ROM: Decreased ROM Stability: No instability detected Muscle strength & Tone: Functionally intact Sensory: Movement-associated pain Palpation: Complains of area being tender to palpation Provocative Tests: Lumbar Hyperextension and rotation test: Positive bilaterally for facet joint pain. Patrick's  Maneuver: evaluation deferred today              Gait & Posture Assessment  Ambulation: Unassisted Gait: Relatively normal for age and body habitus Posture: WNL   Lower Extremity Exam    Side: Right lower extremity  Side: Left lower extremity  Inspection: No masses, redness, swelling, or asymmetry  Inspection: No masses, redness, swelling, or asymmetry  Functional ROM: Unrestricted ROM          Functional ROM: Unrestricted ROM          Muscle strength & Tone: Functionally intact  Muscle strength & Tone: Functionally intact  Sensory: Unimpaired  Sensory: Unimpaired  Palpation: Non-contributory  Palpation: Non-contributory   Assessment  Primary Diagnosis & Pertinent Problem List: The primary encounter diagnosis was Lumbar facet syndrome (Bilateral) (L>R). A diagnosis of Chronic pain syndrome was also pertinent to this visit.  Visit Diagnosis: 1. Lumbar facet syndrome (Bilateral) (L>R)   2. Chronic pain syndrome    Plan of Care  Pharmacotherapy (Medications Ordered): Meds ordered this encounter  Medications  . oxyCODONE (OXY IR/ROXICODONE) 5  MG immediate release tablet    Sig: Take 1 tablet (5 mg total) by mouth every 8 (eight) hours as needed for severe pain.    Dispense:  90 tablet    Refill:  0    Do not place this medication, or any other prescription from our practice, on "Automatic Refill". Patient may have prescription filled one day early if pharmacy is closed on scheduled refill date. Do not fill until: 12/12/15 To last until: 01/11/16  . oxyCODONE (OXY IR/ROXICODONE) 5 MG immediate release tablet    Sig: Take 1 tablet (5 mg total) by mouth every 8 (eight) hours as needed for severe pain.    Dispense:  90 tablet    Refill:  0    Do not place this medication, or any other prescription from our practice, on "Automatic Refill". Patient may have prescription filled one day early if pharmacy is closed on scheduled refill date. Do not fill until: 01/11/16 To last until:  02/10/16  . oxyCODONE (OXY IR/ROXICODONE) 5 MG immediate release tablet    Sig: Take 1 tablet (5 mg total) by mouth every 8 (eight) hours as needed for severe pain.    Dispense:  90 tablet    Refill:  0    Do not place this medication, or any other prescription from our practice, on "Automatic Refill". Patient may have prescription filled one day early if pharmacy is closed on scheduled refill date. Do not fill until: 02/10/16 To last until: 03/11/16   New Prescriptions   No medications on file   Medications administered during this visit: Ms. Sweatt had no medications administered during this visit. Lab-work, Procedure(s), & Referral(s) Ordered: Orders Placed This Encounter  Procedures  . LUMBAR FACET(MEDIAL BRANCH NERVE BLOCK) MBNB   Imaging & Referral(s) Ordered: None  Interventional Therapies: Scheduled:  Diagnostic bilateral lumbar facet block under fluoroscopic guidance and IV sedation.    Considering:   Diagnostic bilateral lumbar facet block under fluoroscopic guidance and IV sedation.  Possible bilateral lumbar facet rate frequency ablation.  Diagnostic bilateral celiac plexus block under fluoroscopic guidance and IV sedation.  Diagnostic bilateral intra-articular hip injection under fluoroscopic guidance, with or without sedation.  Possible diagnostic bilateral femoral nerve and obturator nerve articular branch blocks under fluoroscopic guidance and IV sedation.  Possible bilateral femoral nerve and obturator nerve articular branch frequency ablation.  Diagnostic bilateral intra-articular knee joint injection under fluoroscopic guidance, no sedation.  Possible series of 5 bilateral intra-articular Hyalgan knee injections.  Bilateral diagnostic genicular nerve block under fluoroscopic guidance, with or without sedation.  Possible bilateral genicular nerve radiofrequency ablation under fluoroscopic guidance and IV sedation.    PRN Procedures:   Diagnostic bilateral  lumbar facet block under fluoroscopic guidance and IV sedation.  Diagnostic bilateral celiac plexus block under fluoroscopic guidance and IV sedation.  Diagnostic bilateral intra-articular hip injection under fluoroscopic guidance, with or without sedation.  Possible diagnostic bilateral femoral nerve and obturator nerve articular branch blocks under fluoroscopic guidance and IV sedation.  Diagnostic bilateral intra-articular knee joint injection under fluoroscopic guidance, no sedation.  Bilateral diagnostic genicular nerve block under fluoroscopic guidance, with or without sedation.    Requested PM Follow-up: Return in about 3 months (around 03/05/2016) for Med-Mgmt, In addition, Schedule Procedure.  Future Appointments Date Time Provider [place]  12/18/2015 9:15 AM Clayburn Pert, MD [facility] None  03/07/2016 10:15 AM Milinda Pointer, MD [facility] None   Primary Care Physician: Adin Hector, MD Location: [facility]  Note by: Johnica Armwood A. Dossie Arbour, M.D, DABA, DABAPM, DABPM, DABIPP, FIPP  Pain Score Disclaimer: We use the NRS-11 scale. This is a self-reported, subjective measurement of pain severity with only modest accuracy. It is used primarily to identify changes within a particular patient. It must be understood that outpatient pain scales are significantly less accurate that those used for research, where they can be applied under ideal controlled circumstances with minimal exposure to variables. In reality, the score is likely to be a combination of pain intensity and pain affect, where pain affect describes the degree of emotional arousal or changes in action readiness caused by the sensory experience of pain. Factors such as social and work situation, setting, emotional state, anxiety levels, expectation, and prior pain experience may influence pain perception and show large inter-individual differences that may also be affected by time  variables.  Patient instructions provided during this appointment: Patient Instructions  Facet Blocks Patient Information  Description: The facets are joints in the spine between the vertebrae.  Like any joints in the body, facets can become irritated and painful.  Arthritis can also effect the facets.  By injecting steroids and local anesthetic in and around these joints, we can temporarily block the nerve supply to them.  Steroids act directly on irritated nerves and tissues to reduce selling and inflammation which often leads to decreased pain.  Facet blocks may be done anywhere along the spine from the neck to the low back depending upon the location of your pain.   After numbing the skin with local anesthetic (like Novocaine), a small needle is passed onto the facet joints under x-ray guidance.  You may experience a sensation of pressure while this is being done.  The entire block usually lasts about 15-25 minutes.   Conditions which may be treated by facet blocks:   Low back/buttock pain  Neck/shoulder pain  Certain types of headaches  Preparation for the injection:  1. Do not eat any solid food or dairy products within 8 hours of your appointment. 2. You may drink clear liquid up to 3 hours before appointment.  Clear liquids include water, black coffee, juice or soda.  No milk or cream please. 3. You may take your regular medication, including pain medications, with a sip of water before your appointment.  Diabetics should hold regular insulin (if taken separately) and take 1/2 normal NPH dose the morning of the procedure.  Carry some sugar containing items with you to your appointment. 4. A driver must accompany you and be prepared to drive you home after your procedure. 5. Bring all your current medications with you. 6. An IV may be inserted and sedation may be given at the discretion of the physician. 7. A blood pressure cuff, EKG and other monitors will often be applied during  the procedure.  Some patients may need to have extra oxygen administered for a short period. 8. You will be asked to provide medical information, including your allergies and medications, prior to the procedure.  We must know immediately if you are taking blood thinners (like Coumadin/Warfarin) or if you are allergic to IV iodine contrast (dye).  We must know if you could possible be pregnant.  Possible side-effects:   Bleeding from needle site  Infection (rare, may require surgery)  Nerve injury (rare)  Numbness & tingling (temporary)  Difficulty urinating (rare, temporary)  Spinal headache (a headache worse with upright posture)  Light-headedness (temporary)  Pain at injection site (serveral days)  Decreased blood pressure (  rare, temporary)  Weakness in arm/leg (temporary)  Pressure sensation in back/neck (temporary)   Call if you experience:   Fever/chills associated with headache or increased back/neck pain  Headache worsened by an upright position  New onset, weakness or numbness of an extremity below the injection site  Hives or difficulty breathing (go to the emergency room)  Inflammation or drainage at the injection site(s)  Severe back/neck pain greater than usual  New symptoms which are concerning to you  Please note:  Although the local anesthetic injected can often make your back or neck feel good for several hours after the injection, the pain will likely return. It takes 3-7 days for steroids to work.  You may not notice any pain relief for at least one week.  If effective, we will often do a series of 2-3 injections spaced 3-6 weeks apart to maximally decrease your pain.  After the initial series, you may be a candidate for a more permanent nerve block of the facets.  If you have any questions, please call #[phone] [facility]

## 2015-12-11 NOTE — Patient Instructions (Signed)
Facet Blocks Patient Information  Description: The facets are joints in the spine between the vertebrae.  Like any joints in the body, facets can become irritated and painful.  Arthritis can also effect the facets.  By injecting steroids and local anesthetic in and around these joints, we can temporarily block the nerve supply to them.  Steroids act directly on irritated nerves and tissues to reduce selling and inflammation which often leads to decreased pain.  Facet blocks may be done anywhere along the spine from the neck to the low back depending upon the location of your pain.   After numbing the skin with local anesthetic (like Novocaine), a small needle is passed onto the facet joints under x-ray guidance.  You may experience a sensation of pressure while this is being done.  The entire block usually lasts about 15-25 minutes.   Conditions which may be treated by facet blocks:   Low back/buttock pain  Neck/shoulder pain  Certain types of headaches  Preparation for the injection:  1. Do not eat any solid food or dairy products within 8 hours of your appointment. 2. You may drink clear liquid up to 3 hours before appointment.  Clear liquids include water, black coffee, juice or soda.  No milk or cream please. 3. You may take your regular medication, including pain medications, with a sip of water before your appointment.  Diabetics should hold regular insulin (if taken separately) and take 1/2 normal NPH dose the morning of the procedure.  Carry some sugar containing items with you to your appointment. 4. A driver must accompany you and be prepared to drive you home after your procedure. 5. Bring all your current medications with you. 6. An IV may be inserted and sedation may be given at the discretion of the physician. 7. A blood pressure cuff, EKG and other monitors will often be applied during the procedure.  Some patients may need to have extra oxygen administered for a short  period. 8. You will be asked to provide medical information, including your allergies and medications, prior to the procedure.  We must know immediately if you are taking blood thinners (like Coumadin/Warfarin) or if you are allergic to IV iodine contrast (dye).  We must know if you could possible be pregnant.  Possible side-effects:   Bleeding from needle site  Infection (rare, may require surgery)  Nerve injury (rare)  Numbness & tingling (temporary)  Difficulty urinating (rare, temporary)  Spinal headache (a headache worse with upright posture)  Light-headedness (temporary)  Pain at injection site (serveral days)  Decreased blood pressure (rare, temporary)  Weakness in arm/leg (temporary)  Pressure sensation in back/neck (temporary)   Call if you experience:   Fever/chills associated with headache or increased back/neck pain  Headache worsened by an upright position  New onset, weakness or numbness of an extremity below the injection site  Hives or difficulty breathing (go to the emergency room)  Inflammation or drainage at the injection site(s)  Severe back/neck pain greater than usual  New symptoms which are concerning to you  Please note:  Although the local anesthetic injected can often make your back or neck feel good for several hours after the injection, the pain will likely return. It takes 3-7 days for steroids to work.  You may not notice any pain relief for at least one week.  If effective, we will often do a series of 2-3 injections spaced 3-6 weeks apart to maximally decrease your pain.  After the initial   series, you may be a candidate for a more permanent nerve block of the facets.  If you have any questions, please call [phone] [facility] 

## 2015-12-18 ENCOUNTER — Ambulatory Visit (INDEPENDENT_AMBULATORY_CARE_PROVIDER_SITE_OTHER): Payer: PPO | Admitting: General Surgery

## 2015-12-18 ENCOUNTER — Encounter: Payer: Self-pay | Admitting: General Surgery

## 2015-12-18 VITALS — BP 146/82 | HR 86 | Temp 98.0°F | Wt 216.0 lb

## 2015-12-18 DIAGNOSIS — Z9889 Other specified postprocedural states: Secondary | ICD-10-CM

## 2015-12-18 DIAGNOSIS — R12 Heartburn: Secondary | ICD-10-CM

## 2015-12-18 DIAGNOSIS — IMO0002 Reserved for concepts with insufficient information to code with codable children: Secondary | ICD-10-CM

## 2015-12-18 MED ORDER — FAMOTIDINE 20 MG PO TABS: 20.0000 mg | ORAL_TABLET | Freq: Two times a day (BID) | ORAL | 0 refills | Status: DC

## 2015-12-18 MED ORDER — NYSTATIN 100000 UNIT/GM EX POWD: Freq: Four times a day (QID) | CUTANEOUS | 0 refills | Status: DC

## 2015-12-18 NOTE — Patient Instructions (Signed)
Please give us a call if you have any questions or concerns. 

## 2015-12-18 NOTE — Progress Notes (Signed)
Outpatient Surgical Follow Up  12/18/2015  [name] is an 65 y.o. female.   Chief Complaint  Patient presents with  . Follow-up    Colostomy Takedown (07/12/15)- Dr. Adonis Huguenin    HPI: 65 year old female returns to clinic now 5 months status post colostomy takedown. She continues to have 2 areas of difficult wound healing secondary to her body habitus. She also reports a new burning rash within her skin folds since her last visit. She has been complaining of some heartburn but denies any fevers, chills, nausea, vomiting, chest pain, shortness of breath, diarrhea, constipation.  Past Medical History:  Diagnosis Date  . Anemia   . Anxiety   . Aortic valve stenosis   . Arthritis    feet, legs  . Bowel obstruction   . CKD (chronic kidney disease)    protein in urine  . Colostomy in place [facility])   . Diabetes mellitus without complication ([place])   . Diverticulitis large intestine   . Dysrhythmia   . Ectopic atrial tachycardia ([place])   . Gastritis   . GI bleed   . Heart murmur   . Hyperlipidemia   . Hypertension   . Hypothyroidism   . MRSA (methicillin resistant Staphylococcus aureus)    at abdominal wound.  Jan 2017.  Treated.   . Neuropathy ([place])   . Neuropathy in diabetes ([place])   . Obesity   . Shortness of breath dyspnea   . Vertigo     Past Surgical History:  Procedure Laterality Date  . ABDOMINAL HYSTERECTOMY    . APPLICATION OF WOUND VAC N/A 02/24/2015   Procedure: APPLICATION OF WOUND VAC;  Surgeon: Clayburn Pert, MD;  Location: [facility];  Service: General;  Laterality: N/A;  . APPLICATION OF WOUND VAC N/A 02/26/2015   Procedure: APPLICATION OF WOUND VAC;  Surgeon: Clayburn Pert, MD;  Location: [facility];  Service: General;  Laterality: N/A;  . BACK SURGERY     spur frmoved from lower back  . CARDIAC CATHETERIZATION    . COLECTOMY    . COLOSTOMY REVERSAL N/A 07/12/2015   Procedure: COLOSTOMY REVERSAL;  Surgeon: Clayburn Pert, MD;  Location: [facility];  Service:  General;  Laterality: N/A;  . DEBRIDEMENT OF ABDOMINAL WALL ABSCESS N/A 02/22/2015   Procedure: DEBRIDEMENT OF ABDOMINAL WALL ABSCESS;  Surgeon: Clayburn Pert, MD;  Location: [facility];  Service: General;  Laterality: N/A;  . EXCISION MASS ABDOMINAL N/A 02/24/2015   Procedure: EXCISION MASS ABDOMINAL  / [place];  Surgeon: Clayburn Pert, MD;  Location: [facility];  Service: General;  Laterality: N/A;  . EXCISION MASS ABDOMINAL N/A 02/26/2015   Procedure: EXCISION MASS ABDOMINAL/wash out;  Surgeon: Clayburn Pert, MD;  Location: [facility];  Service: General;  Laterality: N/A;  . FLEXIBLE SIGMOIDOSCOPY  06/19/2015   Procedure: FLEXIBLE SIGMOIDOSCOPY;  Surgeon: Lucilla Lame, MD;  Location: [facility];  Service: Endoscopy;;  UNABLE TO ACCESS OSTOMY SITE FOR ACCESS INTO COLON  . LAPAROSCOPY  07/12/2015   Procedure: LAPAROSCOPY DIAGNOSTIC;  Surgeon: Clayburn Pert, MD;  Location: [facility];  Service: General;;  . LAPAROTOMY  07/12/2015   Procedure: EXPLORATORY LAPAROTOMY;  Surgeon: Clayburn Pert, MD;  Location: [facility];  Service: General;;  . LAPAROTOMY N/A 02/06/2015   Procedure: Laparotomy, reduction of incarcerated parastomal hernia, repair of parastomal hernia with mesh;  Surgeon: Sherri Rad, MD;  Location: [facility];  Service: General;  Laterality: N/A;  . laparotomy closure of cecal perforation  05/09/2013   Dr. Marina Gravel  . LYSIS  OF ADHESION  07/12/2015   Procedure: LYSIS OF ADHESION;  Surgeon: Clayburn Pert, MD;  Location: [facility];  Service: General;;  . TONSILLECTOMY    . WOUND DEBRIDEMENT N/A 03/03/2015   Procedure: DEBRIDEMENT ABDOMINAL WOUND;  Surgeon: Florene Glen, MD;  Location: [facility];  Service: General;  Laterality: N/A;  . WOUND DEBRIDEMENT N/A 03/09/2015   Procedure: DEBRIDEMENT ABDOMINAL WOUND;  Surgeon: Florene Glen, MD;  Location: [facility];  Service: General;  Laterality: N/A;    Family History  Problem Relation Age of Onset  . Diabetes Mother   . Hypertension Father      Social History:  reports that she quit smoking about 2 years ago. Her smoking use included Cigarettes. She has never used smokeless tobacco. She reports that she does not drink alcohol or use drugs.  Allergies:  Allergies  Allergen Reactions  . Buspirone Other (See Comments)    Weakness  . Citalopram Other (See Comments)  . Lisinopril Cough  . Metformin Diarrhea    At 1000 mg dose  . Pravastatin Other (See Comments)    insomnia  . Sitagliptin Other (See Comments)    constipation  . Tramadol Itching  . Diltiazem Hcl Palpitations  . Gabapentin Palpitations  . Hydralazine Rash  . Lovastatin Palpitations    Medications reviewed.    ROS A multipoint review of systems was completed, all pertinent positives and negatives are documented within the history of present illness of the remainder are negative   BP (!) 146/82   Pulse 86   Temp 98 F (36.7 C) (Oral)   Wt 98 kg (216 lb)   BMI 38.26 kg/m   Physical Exam Gen.: No acute distress Neck: Supple and nontender Chest: Clear to auscultation Heart: Regular rate and rhythm Abdomen: Large, soft, nontender. Prior colostomy site in the left upper quadrant is well-healed, midline incision is mostly well-healed. There continues to be an opening of the skin that is 2 mm in diameter at the most cephalad aspect of it as well as a 3 mm opening and what used to be her umbilicus. There appears to be evidence of a yeast infection around the umbilical site where her skin folds together. There is no purulence coming from either site. Extremities: Moves all extremities well    No results found for this or any previous visit (from the past 48 hour(s)). No results found.  Assessment/Plan:  1. H/O colostomy 65 year old female with a history of colostomy reversal. Doing well from a reversal standpoint. Continues to have wound healing difficulties secondary to her body habitus and diabetes. Cephalad wound probed today with granulation  tissue physically removed from what appears to have been a suture granuloma. Umbilical wound appears to be involved in a yeast infection. Nystatin powder prescription provided today and instructions on wound care reiterated. Patient will follow-up in 2-3 weeks for additional wound check.  2. Heart burn Patient with recurrent heartburn. She was treated with Pepcid earlier in her postoperative course with good results. Repeat prescription for Pepcid provided today.     Clayburn Pert, MD FACS General Surgeon  12/18/2015,9:28 AM

## 2016-01-05 ENCOUNTER — Ambulatory Visit (INDEPENDENT_AMBULATORY_CARE_PROVIDER_SITE_OTHER): Payer: Medicare Other | Admitting: General Surgery

## 2016-01-05 ENCOUNTER — Other Ambulatory Visit: Payer: Self-pay

## 2016-01-05 VITALS — BP 133/85 | HR 79 | Temp 98.2°F | Ht 63.0 in | Wt 214.4 lb

## 2016-01-05 DIAGNOSIS — IMO0002 Reserved for concepts with insufficient information to code with codable children: Secondary | ICD-10-CM

## 2016-01-05 DIAGNOSIS — Z9889 Other specified postprocedural states: Secondary | ICD-10-CM

## 2016-01-05 NOTE — Patient Instructions (Signed)
We will see you back in 2 weeks as scheduled below.

## 2016-01-05 NOTE — Progress Notes (Signed)
Outpatient Surgical Follow Up  01/05/2016  [name] is an 65 y.o. female.   No chief complaint on file.   HPI: 65 year old female returns to clinic for follow-up from her colostomy reversal. She continues to have 2 areas of her midline wound that has been difficult to heal. They have been treated numerous times with silver nitrate and her most recent visit the areas were locally debrided. She states the mid abdominal area really hasn't changed since her last visit, however the periumbilical ones have decreased in size with the use of a topical nystatin. She's been eating well and having normal bowel function. She denies any fevers, chills, nausea, vomiting, chest pain, shortness of breath.  Past Medical History:  Diagnosis Date  . Anemia   . Anxiety   . Aortic valve stenosis   . Arthritis    feet, legs  . Bowel obstruction   . CKD (chronic kidney disease)    protein in urine  . Colostomy in place [facility])   . Diabetes mellitus without complication ([place])   . Diverticulitis large intestine   . Dysrhythmia   . Ectopic atrial tachycardia ([place])   . Gastritis   . GI bleed   . Heart murmur   . Hyperlipidemia   . Hypertension   . Hypothyroidism   . MRSA (methicillin resistant Staphylococcus aureus)    at abdominal wound.  Jan 2017.  Treated.   . Neuropathy ([place])   . Neuropathy in diabetes ([place])   . Obesity   . Shortness of breath dyspnea   . Vertigo     Past Surgical History:  Procedure Laterality Date  . ABDOMINAL HYSTERECTOMY    . APPLICATION OF WOUND VAC N/A 02/24/2015   Procedure: APPLICATION OF WOUND VAC;  Surgeon: Clayburn Pert, MD;  Location: [facility];  Service: General;  Laterality: N/A;  . APPLICATION OF WOUND VAC N/A 02/26/2015   Procedure: APPLICATION OF WOUND VAC;  Surgeon: Clayburn Pert, MD;  Location: [facility];  Service: General;  Laterality: N/A;  . BACK SURGERY     spur frmoved from lower back  . CARDIAC CATHETERIZATION    . COLECTOMY    . COLOSTOMY  REVERSAL N/A 07/12/2015   Procedure: COLOSTOMY REVERSAL;  Surgeon: Clayburn Pert, MD;  Location: [facility];  Service: General;  Laterality: N/A;  . DEBRIDEMENT OF ABDOMINAL WALL ABSCESS N/A 02/22/2015   Procedure: DEBRIDEMENT OF ABDOMINAL WALL ABSCESS;  Surgeon: Clayburn Pert, MD;  Location: [facility];  Service: General;  Laterality: N/A;  . EXCISION MASS ABDOMINAL N/A 02/24/2015   Procedure: EXCISION MASS ABDOMINAL  / [place];  Surgeon: Clayburn Pert, MD;  Location: [facility];  Service: General;  Laterality: N/A;  . EXCISION MASS ABDOMINAL N/A 02/26/2015   Procedure: EXCISION MASS ABDOMINAL/wash out;  Surgeon: Clayburn Pert, MD;  Location: [facility];  Service: General;  Laterality: N/A;  . FLEXIBLE SIGMOIDOSCOPY  06/19/2015   Procedure: FLEXIBLE SIGMOIDOSCOPY;  Surgeon: Lucilla Lame, MD;  Location: [place];  Service: Endoscopy;;  UNABLE TO ACCESS OSTOMY SITE FOR ACCESS INTO COLON  . LAPAROSCOPY  07/12/2015   Procedure: LAPAROSCOPY DIAGNOSTIC;  Surgeon: Clayburn Pert, MD;  Location: [facility];  Service: General;;  . LAPAROTOMY  07/12/2015   Procedure: EXPLORATORY LAPAROTOMY;  Surgeon: Clayburn Pert, MD;  Location: [facility];  Service: General;;  . LAPAROTOMY N/A 02/06/2015   Procedure: Laparotomy, reduction of incarcerated parastomal hernia, repair of parastomal hernia with mesh;  Surgeon: Sherri Rad, MD;  Location: [facility];  Service: General;  Laterality: N/A;  . laparotomy closure of cecal perforation  05/09/2013   Dr. Marina Gravel  . LYSIS OF ADHESION  07/12/2015   Procedure: LYSIS OF ADHESION;  Surgeon: Clayburn Pert, MD;  Location: [facility];  Service: General;;  . TONSILLECTOMY    . WOUND DEBRIDEMENT N/A 03/03/2015   Procedure: DEBRIDEMENT ABDOMINAL WOUND;  Surgeon: Florene Glen, MD;  Location: [facility];  Service: General;  Laterality: N/A;  . WOUND DEBRIDEMENT N/A 03/09/2015   Procedure: DEBRIDEMENT ABDOMINAL WOUND;  Surgeon: Florene Glen, MD;  Location: [facility];  Service:  General;  Laterality: N/A;    Family History  Problem Relation Age of Onset  . Diabetes Mother   . Hypertension Father     Social History:  reports that she quit smoking about 2 years ago. Her smoking use included Cigarettes. She has never used smokeless tobacco. She reports that she does not drink alcohol or use drugs.  Allergies:  Allergies  Allergen Reactions  . Buspirone Other (See Comments)    Weakness  . Citalopram Other (See Comments)  . Lisinopril Cough  . Metformin Diarrhea    At 1000 mg dose  . Pravastatin Other (See Comments)    insomnia  . Sitagliptin Other (See Comments)    constipation  . Tramadol Itching  . Diltiazem Hcl Palpitations  . Gabapentin Palpitations  . Hydralazine Rash  . Lovastatin Palpitations    Medications reviewed.    ROS A multipoint review of systems was completed. All pertinent positives and negatives are documented within the history of present illness and remainder are negative.   BP 133/85   Pulse 79   Temp 98.2 F (36.8 C) (Oral)   Ht 5\' 3"  (1.6 m)   Wt 97.3 kg (214 lb 6.4 oz)   BMI 37.98 kg/m   Physical Exam Gen.: No acute distress Neck: Supple and nontender Chest: Clear to auscultation Heart: Regular rhythm Abdomen: Large, soft, nontender, nondistended. Mid abdominal region near the top of her incision with a 1 cm opening in the skin with visible subcutaneous fat. No evidence of spreading erythema or purulence. The periumbilical region with 3 small areas less than 1 mm in diameter of macerated skin that is healing without evidence of infection. Extremities: Moves all chimneys well and is walking without use of assistance.    No results found for this or any previous visit (from the past 48 hour(s)). No results found.  Assessment/Plan:  1. H/O colostomy 65 year old female with a history of an open colostomy reversal. She continues to have difficulty healing her midline wound. This is related to her very large body  habitus. The most superior aspect with the opening in the skin was treated again with silver nitrate. Counseled her that should it not improved by the next time I see her that the area would be completely excised and reclosed in a sterile fashion. The periumbilical region is much improved from her last visit with the use the topical nystatin. Continue the topical treatments as previously prescribed. She'll follow-up in clinic in 3 weeks.     Clayburn Pert, MD FACS General Surgeon  01/05/2016,4:27 PM

## 2016-01-09 ENCOUNTER — Other Ambulatory Visit: Payer: Self-pay | Admitting: Internal Medicine

## 2016-01-09 DIAGNOSIS — Z1231 Encounter for screening mammogram for malignant neoplasm of breast: Secondary | ICD-10-CM

## 2016-01-19 ENCOUNTER — Other Ambulatory Visit: Payer: Self-pay | Admitting: General Surgery

## 2016-01-22 ENCOUNTER — Encounter: Payer: Self-pay | Admitting: General Surgery

## 2016-01-22 ENCOUNTER — Ambulatory Visit (INDEPENDENT_AMBULATORY_CARE_PROVIDER_SITE_OTHER): Payer: Medicare Other | Admitting: General Surgery

## 2016-01-22 VITALS — BP 136/83 | HR 74 | Temp 98.0°F | Ht 63.0 in | Wt 216.0 lb

## 2016-01-22 DIAGNOSIS — IMO0002 Reserved for concepts with insufficient information to code with codable children: Secondary | ICD-10-CM

## 2016-01-22 DIAGNOSIS — Z9889 Other specified postprocedural states: Secondary | ICD-10-CM

## 2016-01-22 NOTE — Patient Instructions (Signed)
Please give us a call if you have any questions or concerns. 

## 2016-01-22 NOTE — Progress Notes (Signed)
Outpatient Surgical Follow Up  01/22/2016  [name] is an 65 y.o. female.   Chief Complaint  Patient presents with  . Follow-up    History of Colostomy Reversal (07/12/15)- Dr. Adonis Huguenin    HPI: 65 year old female who is now greater than 6 months status post open colostomy reversal returns to clinic for continued wound checks. She has had 2 chronic wounds to her midline that is been being treated since her surgery. The most cephalad one appears to be drinking but still has some drainage. The one at her umbilicus she states has gone from 2 small ones and one larger one. She denies any fevers, chills, nausea, vomiting, chest pain, shortness of breath, diarrhea, constipation. She is been eating well and is still been very happy with her surgical experience.  Past Medical History:  Diagnosis Date  . Anemia   . Anxiety   . Aortic valve stenosis   . Arthritis    feet, legs  . Bowel obstruction   . CKD (chronic kidney disease)    protein in urine  . Colostomy in place [facility])   . Diabetes mellitus without complication ([place])   . Diverticulitis large intestine   . Dysrhythmia   . Ectopic atrial tachycardia ([place])   . Gastritis   . GI bleed   . Heart murmur   . Hyperlipidemia   . Hypertension   . Hypothyroidism   . MRSA (methicillin resistant Staphylococcus aureus)    at abdominal wound.  Jan 2017.  Treated.   . Neuropathy ([place])   . Neuropathy in diabetes ([place])   . Obesity   . Shortness of breath dyspnea   . Vertigo     Past Surgical History:  Procedure Laterality Date  . ABDOMINAL HYSTERECTOMY    . APPLICATION OF WOUND VAC N/A 02/24/2015   Procedure: APPLICATION OF WOUND VAC;  Surgeon: Clayburn Pert, MD;  Location: [facility];  Service: General;  Laterality: N/A;  . APPLICATION OF WOUND VAC N/A 02/26/2015   Procedure: APPLICATION OF WOUND VAC;  Surgeon: Clayburn Pert, MD;  Location: [facility];  Service: General;  Laterality: N/A;  . BACK SURGERY     spur frmoved from lower back   . CARDIAC CATHETERIZATION    . COLECTOMY    . COLOSTOMY REVERSAL N/A 07/12/2015   Procedure: COLOSTOMY REVERSAL;  Surgeon: Clayburn Pert, MD;  Location: [facility];  Service: General;  Laterality: N/A;  . DEBRIDEMENT OF ABDOMINAL WALL ABSCESS N/A 02/22/2015   Procedure: DEBRIDEMENT OF ABDOMINAL WALL ABSCESS;  Surgeon: Clayburn Pert, MD;  Location: [facility];  Service: General;  Laterality: N/A;  . EXCISION MASS ABDOMINAL N/A 02/24/2015   Procedure: EXCISION MASS ABDOMINAL  / [place];  Surgeon: Clayburn Pert, MD;  Location: [facility];  Service: General;  Laterality: N/A;  . EXCISION MASS ABDOMINAL N/A 02/26/2015   Procedure: EXCISION MASS ABDOMINAL/wash out;  Surgeon: Clayburn Pert, MD;  Location: [facility];  Service: General;  Laterality: N/A;  . FLEXIBLE SIGMOIDOSCOPY  06/19/2015   Procedure: FLEXIBLE SIGMOIDOSCOPY;  Surgeon: Lucilla Lame, MD;  Location: [place];  Service: Endoscopy;;  UNABLE TO ACCESS OSTOMY SITE FOR ACCESS INTO COLON  . LAPAROSCOPY  07/12/2015   Procedure: LAPAROSCOPY DIAGNOSTIC;  Surgeon: Clayburn Pert, MD;  Location: [facility];  Service: General;;  . LAPAROTOMY  07/12/2015   Procedure: EXPLORATORY LAPAROTOMY;  Surgeon: Clayburn Pert, MD;  Location: [facility];  Service: General;;  . LAPAROTOMY N/A 02/06/2015   Procedure: Laparotomy, reduction of incarcerated parastomal hernia, repair  of parastomal hernia with mesh;  Surgeon: Sherri Rad, MD;  Location: [facility];  Service: General;  Laterality: N/A;  . laparotomy closure of cecal perforation  05/09/2013   Dr. Marina Gravel  . LYSIS OF ADHESION  07/12/2015   Procedure: LYSIS OF ADHESION;  Surgeon: Clayburn Pert, MD;  Location: [facility];  Service: General;;  . TONSILLECTOMY    . WOUND DEBRIDEMENT N/A 03/03/2015   Procedure: DEBRIDEMENT ABDOMINAL WOUND;  Surgeon: Florene Glen, MD;  Location: [facility];  Service: General;  Laterality: N/A;  . WOUND DEBRIDEMENT N/A 03/09/2015   Procedure: DEBRIDEMENT ABDOMINAL WOUND;  Surgeon:  Florene Glen, MD;  Location: [facility];  Service: General;  Laterality: N/A;    Family History  Problem Relation Age of Onset  . Diabetes Mother   . Hypertension Father     Social History:  reports that she quit smoking about 2 years ago. Her smoking use included Cigarettes. She has never used smokeless tobacco. She reports that she does not drink alcohol or use drugs.  Allergies:  Allergies  Allergen Reactions  . Buspirone Other (See Comments)    Weakness  . Citalopram Other (See Comments)  . Lisinopril Cough  . Metformin Diarrhea    At 1000 mg dose  . Pravastatin Other (See Comments)    insomnia  . Sitagliptin Other (See Comments)    constipation  . Tramadol Itching  . Diltiazem Hcl Palpitations  . Gabapentin Palpitations  . Hydralazine Rash  . Lovastatin Palpitations    Medications reviewed.    ROS A multipoint review of systems was completed, all pertinent positives and negatives are documented within the history of present illness and remainder are negative.   BP 136/83   Pulse 74   Temp 98 F (36.7 C) (Oral)   Ht 5\' 3"  (1.6 m)   Wt 98 kg (216 lb)   BMI 38.26 kg/m   Physical Exam  Gen.: No acute distress Neck: Supple and nontender Chest: Clear to auscultation Heart: Regular rate and rhythm Abdomen: Large, soft, nontender, nondistended. Midline incision with a cephalad opening measuring 1 mm diameter. Much smaller than last exam. Periumbilical region now with a 3 mm opening the skin with healthy granulation tissue surrounding it. No evidence of spreading erythema or purulence. Prior ostomy site is well-healed.   No results found for this or any previous visit (from the past 48 hour(s)). No results found.  Assessment/Plan:  1. H/O colostomy 65 year old female greater than 6 months status post open reversal of colostomy. Doing well. Umbilical region treated with silver nitrate today. Discussed continuing local wound care as the wounds are healing.  All questions answered to patient's satisfaction and she'll follow-up in clinic in 5 weeks.     Clayburn Pert, MD FACS General Surgeon  01/22/2016,3:30 PM

## 2016-01-23 ENCOUNTER — Other Ambulatory Visit: Payer: Self-pay | Admitting: General Surgery

## 2016-01-24 ENCOUNTER — Other Ambulatory Visit: Payer: Self-pay | Admitting: General Surgery

## 2016-01-24 MED ORDER — NYSTATIN 100000 UNIT/GM EX POWD: Freq: Four times a day (QID) | CUTANEOUS | 0 refills | Status: DC

## 2016-01-24 NOTE — Telephone Encounter (Signed)
Medication refill sent to pharmacy for patient at this time.

## 2016-01-24 NOTE — Telephone Encounter (Signed)
Needs a rx for Nystop 15mg  [facility] [place]

## 2016-01-25 ENCOUNTER — Ambulatory Visit: Admission: RE | Admit: 2016-01-25 | Payer: Medicare Other | Source: Ambulatory Visit

## 2016-01-26 DIAGNOSIS — M8589 Other specified disorders of bone density and structure, multiple sites: Secondary | ICD-10-CM | POA: Insufficient documentation

## 2016-02-05 ENCOUNTER — Ambulatory Visit (INDEPENDENT_AMBULATORY_CARE_PROVIDER_SITE_OTHER): Payer: Medicare Other | Admitting: Surgery

## 2016-02-05 ENCOUNTER — Encounter: Payer: Self-pay | Admitting: Surgery

## 2016-02-05 VITALS — BP 127/91 | HR 79 | Temp 98.2°F | Ht 63.0 in | Wt 218.0 lb

## 2016-02-05 DIAGNOSIS — Z9889 Other specified postprocedural states: Secondary | ICD-10-CM | POA: Diagnosis not present

## 2016-02-05 DIAGNOSIS — IMO0002 Reserved for concepts with insufficient information to code with codable children: Secondary | ICD-10-CM

## 2016-02-05 NOTE — Progress Notes (Addendum)
02/05/2016  History of Present Illness: [name] is a 65 y.o. female with a history of open colostomy reversal more than 6 months ago who has had chronic wounds to her midline abdominal incision.  She was seen in the office on 12/4 by Dr. Adonis Huguenin who noted 2 small wounds, one in the upper abdominal region measuring 1 mm in diameter and one in the periumbilical region measuring 3 mm in diameter. She's had very small amount of serous drainage from the wounds and she had been using nystatin powder and gauze she had been changing about once a day or so. She reports today that she noted worsening odor and more moisture over the lower wound and was concerned that she could be getting an infection. Denies any fevers, chills, chest pain, shortness of breath, nausea, vomiting, abdominal pain.  Past Medical History: Past Medical History:  Diagnosis Date  . Anemia   . Anxiety   . Aortic valve stenosis   . Arthritis    feet, legs  . Bowel obstruction   . CKD (chronic kidney disease)    protein in urine  . Colostomy in place [facility])   . Diabetes mellitus without complication ([place])   . Diverticulitis large intestine   . Dysrhythmia   . Ectopic atrial tachycardia ([place])   . Gastritis   . GI bleed   . Heart murmur   . Hyperlipidemia   . Hypertension   . Hypothyroidism   . MRSA (methicillin resistant Staphylococcus aureus)    at abdominal wound.  Jan 2017.  Treated.   . Neuropathy ([place])   . Neuropathy in diabetes ([place])   . Obesity   . Shortness of breath dyspnea   . Vertigo      Past Surgical History: Past Surgical History:  Procedure Laterality Date  . ABDOMINAL HYSTERECTOMY    . APPLICATION OF WOUND VAC N/A 02/24/2015   Procedure: APPLICATION OF WOUND VAC;  Surgeon: Clayburn Pert, MD;  Location: [facility];  Service: General;  Laterality: N/A;  . APPLICATION OF WOUND VAC N/A 02/26/2015   Procedure: APPLICATION OF WOUND VAC;  Surgeon: Clayburn Pert, MD;  Location: [facility];  Service:  General;  Laterality: N/A;  . BACK SURGERY     spur frmoved from lower back  . CARDIAC CATHETERIZATION    . COLECTOMY    . COLOSTOMY REVERSAL N/A 07/12/2015   Procedure: COLOSTOMY REVERSAL;  Surgeon: Clayburn Pert, MD;  Location: [facility];  Service: General;  Laterality: N/A;  . DEBRIDEMENT OF ABDOMINAL WALL ABSCESS N/A 02/22/2015   Procedure: DEBRIDEMENT OF ABDOMINAL WALL ABSCESS;  Surgeon: Clayburn Pert, MD;  Location: [facility];  Service: General;  Laterality: N/A;  . EXCISION MASS ABDOMINAL N/A 02/24/2015   Procedure: EXCISION MASS ABDOMINAL  / [place];  Surgeon: Clayburn Pert, MD;  Location: [facility];  Service: General;  Laterality: N/A;  . EXCISION MASS ABDOMINAL N/A 02/26/2015   Procedure: EXCISION MASS ABDOMINAL/wash out;  Surgeon: Clayburn Pert, MD;  Location: [facility];  Service: General;  Laterality: N/A;  . FLEXIBLE SIGMOIDOSCOPY  06/19/2015   Procedure: FLEXIBLE SIGMOIDOSCOPY;  Surgeon: Lucilla Lame, MD;  Location: [place];  Service: Endoscopy;;  UNABLE TO ACCESS OSTOMY SITE FOR ACCESS INTO COLON  . LAPAROSCOPY  07/12/2015   Procedure: LAPAROSCOPY DIAGNOSTIC;  Surgeon: Clayburn Pert, MD;  Location: [facility];  Service: General;;  . LAPAROTOMY  07/12/2015   Procedure: EXPLORATORY LAPAROTOMY;  Surgeon: Clayburn Pert, MD;  Location: [facility];  Service: General;;  .  LAPAROTOMY N/A 02/06/2015   Procedure: Laparotomy, reduction of incarcerated parastomal hernia, repair of parastomal hernia with mesh;  Surgeon: Sherri Rad, MD;  Location: [facility];  Service: General;  Laterality: N/A;  . laparotomy closure of cecal perforation  05/09/2013   Dr. Marina Gravel  . LYSIS OF ADHESION  07/12/2015   Procedure: LYSIS OF ADHESION;  Surgeon: Clayburn Pert, MD;  Location: [facility];  Service: General;;  . TONSILLECTOMY    . WOUND DEBRIDEMENT N/A 03/03/2015   Procedure: DEBRIDEMENT ABDOMINAL WOUND;  Surgeon: Florene Glen, MD;  Location: [facility];  Service: General;  Laterality: N/A;  . WOUND  DEBRIDEMENT N/A 03/09/2015   Procedure: DEBRIDEMENT ABDOMINAL WOUND;  Surgeon: Florene Glen, MD;  Location: [facility];  Service: General;  Laterality: N/A;    Home Medications: Prior to Admission medications   Medication Sig Start Date End Date Taking? Authorizing Provider  amLODipine (NORVASC) 5 MG tablet Take 1 tablet (5 mg total) by mouth daily. 07/22/15  Yes Florene Glen, MD  atorvastatin (LIPITOR) 40 MG tablet Take by mouth. 04/19/15  Yes Historical Provider, MD  calcitRIOL (ROCALTROL) 0.25 MCG capsule Take 1 capsule by mouth every other day. 12/22/15  Yes Historical Provider, MD  clobetasol cream (TEMOVATE) 1.61 % Apply 1 application topically daily. 12/29/15  Yes Historical Provider, MD  DULoxetine (CYMBALTA) 30 MG capsule Take by mouth. 01/18/15  Yes Historical Provider, MD  Ergocalciferol (VITAMIN D2) 2000 units TABS Take by mouth.   Yes Historical Provider, MD  famotidine (PEPCID) 20 MG tablet Take 1 tablet (20 mg total) by mouth 2 (two) times daily. 12/18/15  Yes Clayburn Pert, MD  gabapentin (NEURONTIN) 300 MG capsule Take 1 capsule by mouth daily. 10/25/15  Yes Historical Provider, MD  glimepiride (AMARYL) 4 MG tablet Take 4 mg by mouth 2 (two) times daily. 1 tab AM, 1/2 tab PM 08/12/14  Yes Historical Provider, MD  glucose blood (ONE TOUCH ULTRA TEST) test strip TEST TWICE DAILY 09/06/14  Yes Historical Provider, MD  insulin glargine (LANTUS) 100 UNIT/ML injection Inject 8 Units into the skin at bedtime.    Yes Historical Provider, MD  LANTUS SOLOSTAR 100 UNIT/ML Solostar Pen Inject 8 Units into the skin at bedtime.  08/12/15  Yes Historical Provider, MD  levothyroxine (SYNTHROID, LEVOTHROID) 88 MCG tablet TAKE 1 TABLET BY MOUTH ONCE A DAY FOR THYROID 09/11/15  Yes Historical Provider, MD  metoprolol succinate (TOPROL-XL) 25 MG 24 hr tablet TAKE 1 TABLET BY MOUTH ONCE A DAY 07/24/15  Yes Historical Provider, MD  nystatin (MYCOSTATIN/NYSTOP) powder Apply topically 4 (four) times daily.  01/24/16  Yes Clayburn Pert, MD  [facility] DELICA LANCETS 09U [place] TEST TWICE DAILY 09/06/14  Yes Historical Provider, MD  oxyCODONE (OXY IR/ROXICODONE) 5 MG immediate release tablet Take 1 tablet (5 mg total) by mouth every 8 (eight) hours as needed for severe pain. 01/11/16 02/10/16 Yes Milinda Pointer, MD  oxyCODONE (OXY IR/ROXICODONE) 5 MG immediate release tablet Take 1 tablet (5 mg total) by mouth every 8 (eight) hours as needed for severe pain. 02/10/16 03/11/16 Yes Milinda Pointer, MD  telmisartan (MICARDIS) 80 MG tablet TAKE 1 TABLET BY MOUTH ONCE A DAY 03/06/15  Yes Historical Provider, MD  Vitamin D, Ergocalciferol, (DRISDOL) 50000 units CAPS capsule Take by mouth.   Yes Historical Provider, MD    Allergies: Allergies  Allergen Reactions  . Buspirone Other (See Comments)    Weakness  . Citalopram Other (See Comments)  . Lisinopril Cough  . Metformin  Diarrhea    At 1000 mg dose  . Pravastatin Other (See Comments)    insomnia  . Sitagliptin Other (See Comments)    constipation  . Tramadol Itching  . Diltiazem Hcl Palpitations  . Gabapentin Palpitations  . Hydralazine Rash  . Lovastatin Palpitations    Social History:  reports that she quit smoking about 2 years ago. Her smoking use included Cigarettes. She has never used smokeless tobacco. She reports that she does not drink alcohol or use drugs.   Family History: Family History  Problem Relation Age of Onset  . Diabetes Mother   . Hypertension Father     Review of Systems: Review of Systems  Constitutional: Negative for chills and fever.  HENT: Negative for hearing loss.   Eyes: Negative for blurred vision.  Respiratory: Negative for cough and shortness of breath.   Cardiovascular: Negative for chest pain and leg swelling.  Gastrointestinal: Negative for abdominal pain, constipation, heartburn, nausea and vomiting.  Genitourinary: Negative for dysuria.  Musculoskeletal: Negative for myalgias.  Neurological:  Negative for dizziness.  Psychiatric/Behavioral: Negative for depression.  All other systems reviewed and are negative.   Physical Exam BP (!) 127/91   Pulse 79   Temp 98.2 F (36.8 C) (Oral)   Ht 5\' 3"  (1.6 m)   Wt 98.9 kg (218 lb)   BMI 38.62 kg/m  CONSTITUTIONAL: No acute distress HEENT:  Normocephalic, atraumatic, extraocular motion intact. NECK: Trachea is midline, and there is no jugular venous distension.  RESPIRATORY:  Lungs are clear, and breath sounds are equal bilaterally. Normal respiratory effort without pathologic use of accessory muscles. CARDIOVASCULAR: Heart is regular without murmurs, gallops, or rubs. GI: The abdomen is soft, nondistended, nontender to palpation. Patient has a stable upper abdominal small wound with very minimal serous drainage. The periumbilical wound remains the same size but there is a new wound just inferior to it measuring about 5 mm in size with a small amount of subcutaneous tissue exposed. Between these 2 wounds the skin edges appeared to be somewhat macerated with moisture there is minimal odor to it. There is no purulent drainage on any of the wounds. There is no worsening erythema or induration either. MUSCULOSKELETAL:  Normal muscle strength and tone in all four extremities.  No peripheral edema or cyanosis. NEUROLOGIC:  Motor and sensation is grossly normal.  Cranial nerves are grossly intact. PSYCH:  Alert and oriented to person, place and time. Affect is normal.   Assessment and Plan: This is a 65 y.o. female who presents with chronic wounds to the midline incision status post colostomy reversal in 06/2015.  -New wound just inferior to the previous periumbilical incision is clean with no evidence of infection but there is surrounding macerated skin consistent with persistent moisture. Applied silver nitrate to wounds.  Have advised the patient to continue applying nystatin powder to the area to absorb any moisture but do more frequent  dressing changes at home specifically twice daily to 3 times daily depending on the amount of moisture on the gauze. It is very important the patient continue to keep that area clean and dry to prevent worse maceration or infection. This was discussed with the patient and she showed understanding. She will be seen next month by Dr. Adonis Huguenin as previously scheduled.   Melvyn Neth, [place]

## 2016-02-05 NOTE — Patient Instructions (Signed)
Please change your dressing 2-3 times daily. Please continue to apply the Nystatin powder as well. Please keep your follow up appointment with Dr.Woodham. Please call our office if you have any questions or concerns.

## 2016-02-20 ENCOUNTER — Other Ambulatory Visit: Payer: Self-pay

## 2016-02-26 ENCOUNTER — Encounter: Payer: Self-pay | Admitting: General Surgery

## 2016-02-26 ENCOUNTER — Ambulatory Visit (INDEPENDENT_AMBULATORY_CARE_PROVIDER_SITE_OTHER): Payer: Medicare Other | Admitting: General Surgery

## 2016-02-26 VITALS — BP 150/89 | HR 76 | Temp 98.4°F | Ht 63.0 in | Wt 217.8 lb

## 2016-02-26 DIAGNOSIS — Z9889 Other specified postprocedural states: Secondary | ICD-10-CM | POA: Diagnosis not present

## 2016-02-26 DIAGNOSIS — IMO0002 Reserved for concepts with insufficient information to code with codable children: Secondary | ICD-10-CM

## 2016-02-26 MED ORDER — NYSTATIN 100000 UNIT/GM EX POWD: Freq: Four times a day (QID) | CUTANEOUS | 0 refills | Status: DC

## 2016-02-26 NOTE — Patient Instructions (Signed)
Please see your follow up appointment listed below. We   Have sent your Nystatin powder to your pharmacy. Please call our office if you have any questions or concerns.

## 2016-02-26 NOTE — Progress Notes (Signed)
Outpatient Surgical Follow Up  02/26/2016  [name] is an 66 y.o. female.   Chief Complaint  Patient presents with  . Follow-up    Colostomy Takedown (07/12/15)- Dr. Adonis Huguenin    HPI: 66 year old female returns to clinic for additional follow-up of her chronic nonhealing midline wound associated with her colostomy takedown from last May. Patient reports that since her last visit the more cephalad area of nonhealing appears to have completely healed. No drainage or discomfort from that area at all. The area of the umbilicus has also shrunk but not completely healed. She has been performing daily local wound care. She denies any fevers, chills, nausea, vomiting, chest pain, short of breath, diarrhea, constipation. She denies any pain or discomfort to the area.  Past Medical History:  Diagnosis Date  . Anemia   . Anxiety   . Aortic valve stenosis   . Arthritis    feet, legs  . Bowel obstruction   . CKD (chronic kidney disease)    protein in urine  . Colostomy in place [facility])   . Diabetes mellitus without complication ([place])   . Diverticulitis large intestine   . Dysrhythmia   . Ectopic atrial tachycardia ([place])   . Gastritis   . GI bleed   . Heart murmur   . Hyperlipidemia   . Hypertension   . Hypothyroidism   . MRSA (methicillin resistant Staphylococcus aureus)    at abdominal wound.  Jan 2017.  Treated.   . Neuropathy ([place])   . Neuropathy in diabetes ([place])   . Obesity   . Shortness of breath dyspnea   . Vertigo     Past Surgical History:  Procedure Laterality Date  . ABDOMINAL HYSTERECTOMY    . APPLICATION OF WOUND VAC N/A 02/24/2015   Procedure: APPLICATION OF WOUND VAC;  Surgeon: Clayburn Pert, MD;  Location: [facility];  Service: General;  Laterality: N/A;  . APPLICATION OF WOUND VAC N/A 02/26/2015   Procedure: APPLICATION OF WOUND VAC;  Surgeon: Clayburn Pert, MD;  Location: [facility];  Service: General;  Laterality: N/A;  . BACK SURGERY     spur frmoved from lower  back  . CARDIAC CATHETERIZATION    . COLECTOMY    . COLOSTOMY REVERSAL N/A 07/12/2015   Procedure: COLOSTOMY REVERSAL;  Surgeon: Clayburn Pert, MD;  Location: [facility];  Service: General;  Laterality: N/A;  . DEBRIDEMENT OF ABDOMINAL WALL ABSCESS N/A 02/22/2015   Procedure: DEBRIDEMENT OF ABDOMINAL WALL ABSCESS;  Surgeon: Clayburn Pert, MD;  Location: [facility];  Service: General;  Laterality: N/A;  . EXCISION MASS ABDOMINAL N/A 02/24/2015   Procedure: EXCISION MASS ABDOMINAL  / [place];  Surgeon: Clayburn Pert, MD;  Location: [facility];  Service: General;  Laterality: N/A;  . EXCISION MASS ABDOMINAL N/A 02/26/2015   Procedure: EXCISION MASS ABDOMINAL/wash out;  Surgeon: Clayburn Pert, MD;  Location: [facility];  Service: General;  Laterality: N/A;  . FLEXIBLE SIGMOIDOSCOPY  06/19/2015   Procedure: FLEXIBLE SIGMOIDOSCOPY;  Surgeon: Lucilla Lame, MD;  Location: [place];  Service: Endoscopy;;  UNABLE TO ACCESS OSTOMY SITE FOR ACCESS INTO COLON  . LAPAROSCOPY  07/12/2015   Procedure: LAPAROSCOPY DIAGNOSTIC;  Surgeon: Clayburn Pert, MD;  Location: [facility];  Service: General;;  . LAPAROTOMY  07/12/2015   Procedure: EXPLORATORY LAPAROTOMY;  Surgeon: Clayburn Pert, MD;  Location: [facility];  Service: General;;  . LAPAROTOMY N/A 02/06/2015   Procedure: Laparotomy, reduction of incarcerated parastomal hernia, repair of parastomal hernia with mesh;  Surgeon:  Sherri Rad, MD;  Location: [facility];  Service: General;  Laterality: N/A;  . laparotomy closure of cecal perforation  05/09/2013   Dr. Marina Gravel  . LYSIS OF ADHESION  07/12/2015   Procedure: LYSIS OF ADHESION;  Surgeon: Clayburn Pert, MD;  Location: [facility];  Service: General;;  . TONSILLECTOMY    . WOUND DEBRIDEMENT N/A 03/03/2015   Procedure: DEBRIDEMENT ABDOMINAL WOUND;  Surgeon: Florene Glen, MD;  Location: [facility];  Service: General;  Laterality: N/A;  . WOUND DEBRIDEMENT N/A 03/09/2015   Procedure: DEBRIDEMENT ABDOMINAL WOUND;   Surgeon: Florene Glen, MD;  Location: [facility];  Service: General;  Laterality: N/A;    Family History  Problem Relation Age of Onset  . Diabetes Mother   . Hypertension Father     Social History:  reports that she quit smoking about 2 years ago. Her smoking use included Cigarettes. She has never used smokeless tobacco. She reports that she does not drink alcohol or use drugs.  Allergies:  Allergies  Allergen Reactions  . Buspirone Other (See Comments)    Weakness  . Citalopram Other (See Comments)  . Lisinopril Cough  . Metformin Diarrhea    At 1000 mg dose  . Pravastatin Other (See Comments)    insomnia  . Sitagliptin Other (See Comments)    constipation  . Tramadol Itching  . Diltiazem Hcl Palpitations  . Gabapentin Palpitations  . Hydralazine Rash  . Lovastatin Palpitations    Medications reviewed.    ROS A multipoint review of systems was completed. All pertinent positives and negatives are documented within the history of present illness the remainder are negative.   BP (!) 150/89   Pulse 76   Temp 98.4 F (36.9 C) (Oral)   Ht 5\' 3"  (1.6 m)   Wt 98.8 kg (217 lb 12.8 oz)   BMI 38.58 kg/m   Physical Exam Gen.: No acute distress Chest: Clear to auscultation Heart: Regular rhythm Abdomen: Large, soft, nontender. Multiple well-healed prior incision site. Midline incision completely healed with the exception of a 2 x 3 mm aspect around her previous umbilicus. Hypercoagulation tissue is present at this site. No evidence of infection or purulent drainage. Extremities: Moves all extremities well.    No results found for this or any previous visit (from the past 48 hour(s)). No results found.  Assessment/Plan:  1. H/O colostomy 66 year old female with slow healing of her large midline incision secondary to body habitus and diabetes. Appears to continue to improve. Area around the umbilicus treated with silver nitrate again today. Counseled her as to the  importance of keeping the area dry as her skin folds folded over this area keeping it moist with a chronic fungal infection. Refills for nystatin powder were called in for her today. Again reiterated the signs and symptoms of infection and return to clinic immediately should they occur. Otherwise discussed silver nitrate will have dark drainage and she'll follow-up in clinic in 3 weeks for additional wound check. All questions answered to patient's satisfaction.  A total of 15 minutes was used for this encounter with greater than 50% of this being used for counseling her coordination of care.     Clayburn Pert, MD FACS General Surgeon  02/26/2016,2:28 PM

## 2016-03-07 ENCOUNTER — Ambulatory Visit: Payer: Medicare Other | Admitting: Pain Medicine

## 2016-03-11 DIAGNOSIS — N051 Unspecified nephritic syndrome with focal and segmental glomerular lesions: Secondary | ICD-10-CM | POA: Insufficient documentation

## 2016-03-11 DIAGNOSIS — G894 Chronic pain syndrome: Secondary | ICD-10-CM | POA: Insufficient documentation

## 2016-03-11 DIAGNOSIS — E669 Obesity, unspecified: Secondary | ICD-10-CM | POA: Insufficient documentation

## 2016-03-11 NOTE — Progress Notes (Signed)
Patient's Name: [name]  MRN: [id number]  Referring Provider: Adin Hector, MD  DOB: 03/06/1950  PCP: Adin Hector, MD  DOS: 03/12/2016  Note by: Kathlen Brunswick. Dossie Arbour, MD  Service setting: Ambulatory outpatient  Specialty: Interventional Pain Management  Location: [facility] (AMB) Pain Management Facility    Patient type: Established   Primary Reason(s) for Visit: Encounter for prescription drug management (Level of risk: moderate) CC: Back Pain (lower) and Hip Pain (left)  HPI  [name] is a 66 y.o. year old, female patient, who comes today for a medication management evaluation. She has Obesity; Small bowel obstruction; Absolute anemia; Aortic heart valve narrowing; Arthritis; B12 deficiency; Benign essential HTN; Chronic kidney disease (CKD), stage III (moderate); Focal and segmental hyalinosis; Bleeding gastrointestinal; Acquired atrophy of thyroid; Abnormal presence of protein in urine; Psoriasis; Paroxysmal supraventricular tachycardia ([place]); Breathlessness on exertion; Type 2 diabetes mellitus ([place]); Chronic abdominal pain (Location of Primary Source of Pain) (Bilateral) (L>R); Long term current use of opiate analgesic; Long term prescription opiate use; Opiate use (60 MME/Day); Encounter for therapeutic drug level monitoring; Encounter for pain management planning; Neurogenic pain; Musculoskeletal pain; Visceral abdominal pain; Diabetic peripheral neuropathy ([place]); Chronic low back pain (Location of Secondary source of pain) (Bilateral) (L>R); Chronic lower extremity pain (Location of Tertiary source of pain) (Bilateral) (R>L); Chronic hip pain (Bilateral) (L>R); Chronic knee pain (Bilateral) (L>R); Chronic neck pain (Bilateral) (L>R); H/O colostomy; Diabetic neuropathy ([place]); Recurrent major depressive disorder, in full remission ([place]); Lumbar spondylosis; Lumbar facet syndrome (Bilateral) (L>R); Polyneuropathy ([place]); Heart burn; Osteopenia of multiple sites; FSGS (focal segmental  glomerulosclerosis); Chronic pain syndrome; and Obesity, Class II, BMI 35-39.9 on her problem list. Her primarily concern today is the Back Pain (lower) and Hip Pain (left)  Pain Assessment: Self-Reported Pain Score: 5 /10 Clinically the patient looks like a 2/10 Reported level is inconsistent with clinical observations. Information on the proper use of the pain score provided to the patient today Pain Type: Chronic pain Pain Location: Back Pain Orientation: Lower Pain Descriptors / Indicators: Dull Pain Frequency: Intermittent  [name] was last seen on 12/11/2015 for medication management. During today's appointment we reviewed [name]'s chronic pain status, as well as her outpatient medication regimen.  The patient  reports that she does not use drugs. Her body mass index is 37.91 kg/m.  Further details on both, my assessment(s), as well as the proposed treatment plan, please see below.  Controlled Substance Pharmacotherapy Assessment REMS (Risk Evaluation and Mitigation Strategy)  Analgesic:Oxycodone IR 55m every 8hours (176mday) MME/day:22.5 mg/day   Alicia MartinsRN  03/12/2016 10:26 AM  Sign at close encounter Nursing Pain Medication Assessment:  Safety precautions to be maintained throughout the outpatient stay will include: orient to surroundings, keep bed in low position, maintain call bell within reach at all times, provide assistance with transfer out of bed and ambulation.  Medication Inspection Compliance: Pill count conducted under aseptic conditions, in front of the patient. Neither the pills nor the bottle was removed from the patient's sight at any time. Once count was completed pills were immediately returned to the patient in their original bottle.  Medication: Morphine IR Pill Count: 23 of 90 pills remain Bottle Appearance: Standard pharmacy container. Clearly labeled. Filled Date: 01 / 04/ 2017 Medication last intake: 03-11-16 at 2200    Pharmacokinetics: Liberation and absorption (onset of action): WNL Distribution (time to peak effect): WNL Metabolism and excretion (duration of action): WNL  Pharmacodynamics: Desired effects: Analgesia: [name] reports >50% benefit. Functional ability: Patient reports that medication allows her to accomplish basic ADLs Clinically meaningful improvement in function (CMIF): Sustained CMIF goals met Perceived effectiveness: Described as relatively effective, allowing for increase in activities of daily living (ADL) Undesirable effects: Side-effects or Adverse reactions: None reported Monitoring: [place] PMP: Online review of the past 52-monthperiod conducted. Compliant with practice rules and regulations List of all UDS test(s) done:  Lab Results  Component Value Date   TOXASSSELUR FINAL 11/07/2015   SUMMARY FINAL 07/03/2015   Last UDS on record: ToxAssure Select 13  Date Value Ref Range Status  11/07/2015 FINAL  Final    Comment:    ==================================================================== TOXASSURE SELECT 13 (MW) ==================================================================== Test                             Result       Flag       Units Drug Present and Declared for Prescription Verification   Oxycodone                      241          EXPECTED   ng/mg creat   Oxymorphone                    288          EXPECTED   ng/mg creat   Noroxycodone                   191          EXPECTED   ng/mg creat   Noroxymorphone                 89           EXPECTED   ng/mg creat    Sources of oxycodone are scheduled prescription medications.    Oxymorphone, noroxycodone, and noroxymorphone are expected    metabolites of oxycodone. Oxymorphone is also available as a    scheduled prescription medication. ==================================================================== Test                      Result    Flag   Units      Ref Range   Creatinine              56                mg/dL      >=20 ==================================================================== Declared Medications:  The flagging and interpretation on this report are based on the  following declared medications.  Unexpected results may arise from  inaccuracies in the declared medications.  **Note: The testing scope of this panel includes these medications:  Oxycodone  **Note: The testing scope of this panel does not include following  reported medications:  Amiodarone (Pacerone)  Amlodipine (Norvasc)  Atorvastatin (Lipitor)  Bisacodyl  Calcium Carbonate (Tums)  Clobetasol (Temovate)  Duloxetine (Cymbalta)  Gabapentin (Neurontin)  Glimepiride (Amaryl)  Insulin (Lantus)  Levothyroxine (Levothroid)  Levothyroxine (Synthroid)  Metoprolol (Toprol)  Telmisartan (Micardis)  Vitamin D2 (Drisdol) ==================================================================== For clinical consultation, please call [phone] ====================================================================    UDS interpretation: Compliant          Medication Assessment Form: Reviewed. Patient indicates being compliant with therapy Treatment compliance: Compliant Risk Assessment Profile: Aberrant behavior: See prior evaluations. None observed or detected today Comorbid factors increasing risk of overdose:  See prior notes. No additional risks detected today Risk of substance use disorder (SUD): Low Opioid Risk Tool (ORT) Total Score: 0  Interpretation Table:  Score <3 = Low Risk for SUD  Score between 4-7 = Moderate Risk for SUD  Score >8 = High Risk for Opioid Abuse   Risk Mitigation Strategies:  Patient Counseling: Covered Patient-Prescriber Agreement (PPA): Present and active  Notification to other healthcare providers: Done  Pharmacologic Plan: No change in therapy, at this time  Laboratory Chemistry  Inflammation Markers Lab Results  Component Value Date   ESRSEDRATE 16 11/07/2015   CRP  0.6 11/07/2015   Renal Function Lab Results  Component Value Date   BUN 33 (H) 07/22/2015   CREATININE 2.33 (H) 07/22/2015   GFRAA 24 (L) 07/22/2015   GFRNONAA 21 (L) 07/22/2015   Hepatic Function Lab Results  Component Value Date   AST 14 (L) 02/22/2015   ALT 13 (L) 02/22/2015   ALBUMIN 2.5 (L) 07/21/2015   Electrolytes Lab Results  Component Value Date   NA 138 07/22/2015   K 4.2 07/22/2015   CL 110 07/22/2015   CALCIUM 7.6 (L) 07/22/2015   MG 2.2 11/07/2015   Pain Modulating Vitamins Lab Results  Component Value Date   25OHVITD1 43 11/07/2015   25OHVITD2 38 11/07/2015   25OHVITD3 5.4 11/07/2015   VITAMINB12 273 11/07/2015   Coagulation Parameters Lab Results  Component Value Date   INR 1.28 02/22/2015   LABPROT 16.1 (H) 02/22/2015   APTT 36 02/22/2015   PLT 315 07/22/2015   Cardiovascular Lab Results  Component Value Date   HGB 8.9 (L) 07/22/2015   HCT 28.0 (L) 07/22/2015   Note: Lab results reviewed.  Recent Diagnostic Imaging Review  No results found. Note: Imaging results reviewed.          Meds  The patient has a current medication list which includes the following prescription(s): amlodipine, atorvastatin, calcitriol, clobetasol cream, diptheria-tetanus toxoids, duloxetine, vitamin d2, famotidine, glimepiride, glucose blood, insulin glargine, levothyroxine, metoprolol succinate, nystatin, onetouch delica lancets 17O, telmisartan, vitamin d (ergocalciferol), oxycodone, oxycodone, and oxycodone.  Current Outpatient Prescriptions on File Prior to Visit  Medication Sig  . amLODipine (NORVASC) 5 MG tablet Take 1 tablet (5 mg total) by mouth daily.  Marland Kitchen atorvastatin (LIPITOR) 40 MG tablet Take 40 mg by mouth daily at 6 PM.   . calcitRIOL (ROCALTROL) 0.25 MCG capsule Take 1 capsule by mouth every other day.  . clobetasol cream (TEMOVATE) 1.60 % Apply 1 application topically daily.  Marland Kitchen diptheria-tetanus toxoids [facility]) 2-2 LF/0.5ML injection   .  DULoxetine (CYMBALTA) 30 MG capsule Take 30 mg by mouth daily.   . Ergocalciferol (VITAMIN D2) 2000 units TABS Take 1.25 mg by mouth once a week.   . famotidine (PEPCID) 20 MG tablet Take 1 tablet (20 mg total) by mouth 2 (two) times daily.  Marland Kitchen glimepiride (AMARYL) 4 MG tablet Take 2 mg by mouth at bedtime. 1 tab AM, 1/2 tab PM  . glucose blood (ONE TOUCH ULTRA TEST) test strip TEST TWICE DAILY  . insulin glargine (LANTUS) 100 UNIT/ML injection Inject 14 Units into the skin at bedtime.   Marland Kitchen levothyroxine (SYNTHROID, LEVOTHROID) 88 MCG tablet TAKE 1 TABLET BY MOUTH ONCE A DAY FOR THYROID  . metoprolol succinate (TOPROL-XL) 25 MG 24 hr tablet TAKE 1 TABLET BY MOUTH ONCE A DAY  . nystatin (MYCOSTATIN/NYSTOP) powder Apply topically 4 (four) times daily.  Glory Rosebush DELICA LANCETS 73X MISC TEST TWICE DAILY  .  telmisartan (MICARDIS) 80 MG tablet TAKE 1 TABLET BY MOUTH ONCE A DAY  . Vitamin D, Ergocalciferol, (DRISDOL) 50000 units CAPS capsule Take 50,000 Units by mouth every 7 (seven) days.    No current facility-administered medications on file prior to visit.    ROS  Constitutional: Denies any fever or chills Gastrointestinal: No reported hemesis, hematochezia, vomiting, or acute GI distress Musculoskeletal: Denies any acute onset joint swelling, redness, loss of ROM, or weakness Neurological: No reported episodes of acute onset apraxia, aphasia, dysarthria, agnosia, amnesia, paralysis, loss of coordination, or loss of consciousness  Allergies  Ms. Rottmann is allergic to buspirone; citalopram; lisinopril; metformin; pravastatin; sitagliptin; tramadol; diltiazem hcl; gabapentin; hydralazine; and lovastatin.  [place]  Drug: Ms. Ask  reports that she does not use drugs. Alcohol:  reports that she does not drink alcohol. Tobacco:  reports that she quit smoking about 2 years ago. Her smoking use included Cigarettes. She has never used smokeless tobacco. Medical:  has a past medical history of Anemia;  Anxiety; Aortic valve stenosis; Arthritis; Bowel obstruction; CKD (chronic kidney disease); Colostomy in place [facility]); Diabetes mellitus without complication ([place]); Diverticulitis large intestine; Dysrhythmia; Ectopic atrial tachycardia ([place]); Gastritis; GI bleed; Heart murmur; Hyperlipidemia; Hypertension; Hypothyroidism; MRSA (methicillin resistant Staphylococcus aureus); Neuropathy ([place]); Neuropathy in diabetes ([place]); Obesity; Shortness of breath dyspnea; and Vertigo. Family: family history includes Diabetes in her mother; Hypertension in her father.  Past Surgical History:  Procedure Laterality Date  . ABDOMINAL HYSTERECTOMY    . APPLICATION OF WOUND VAC N/A 02/24/2015   Procedure: APPLICATION OF WOUND VAC;  Surgeon: Clayburn Pert, MD;  Location: [facility];  Service: General;  Laterality: N/A;  . APPLICATION OF WOUND VAC N/A 02/26/2015   Procedure: APPLICATION OF WOUND VAC;  Surgeon: Clayburn Pert, MD;  Location: [facility];  Service: General;  Laterality: N/A;  . BACK SURGERY     spur frmoved from lower back  . CARDIAC CATHETERIZATION    . COLECTOMY    . COLOSTOMY REVERSAL N/A 07/12/2015   Procedure: COLOSTOMY REVERSAL;  Surgeon: Clayburn Pert, MD;  Location: [facility];  Service: General;  Laterality: N/A;  . DEBRIDEMENT OF ABDOMINAL WALL ABSCESS N/A 02/22/2015   Procedure: DEBRIDEMENT OF ABDOMINAL WALL ABSCESS;  Surgeon: Clayburn Pert, MD;  Location: [facility];  Service: General;  Laterality: N/A;  . EXCISION MASS ABDOMINAL N/A 02/24/2015   Procedure: EXCISION MASS ABDOMINAL  / [place];  Surgeon: Clayburn Pert, MD;  Location: [facility];  Service: General;  Laterality: N/A;  . EXCISION MASS ABDOMINAL N/A 02/26/2015   Procedure: EXCISION MASS ABDOMINAL/wash out;  Surgeon: Clayburn Pert, MD;  Location: [facility];  Service: General;  Laterality: N/A;  . FLEXIBLE SIGMOIDOSCOPY  06/19/2015   Procedure: FLEXIBLE SIGMOIDOSCOPY;  Surgeon: Lucilla Lame, MD;  Location: [facility];  Service:  Endoscopy;;  UNABLE TO ACCESS OSTOMY SITE FOR ACCESS INTO COLON  . LAPAROSCOPY  07/12/2015   Procedure: LAPAROSCOPY DIAGNOSTIC;  Surgeon: Clayburn Pert, MD;  Location: [facility];  Service: General;;  . LAPAROTOMY  07/12/2015   Procedure: EXPLORATORY LAPAROTOMY;  Surgeon: Clayburn Pert, MD;  Location: [facility];  Service: General;;  . LAPAROTOMY N/A 02/06/2015   Procedure: Laparotomy, reduction of incarcerated parastomal hernia, repair of parastomal hernia with mesh;  Surgeon: Sherri Rad, MD;  Location: [facility];  Service: General;  Laterality: N/A;  . laparotomy closure of cecal perforation  05/09/2013   Dr. Marina Gravel  . LYSIS OF ADHESION  07/12/2015   Procedure: LYSIS OF ADHESION;  Surgeon: Juanda Crumble  Adonis Huguenin, MD;  Location: [facility];  Service: General;;  . TONSILLECTOMY    . WOUND DEBRIDEMENT N/A 03/03/2015   Procedure: DEBRIDEMENT ABDOMINAL WOUND;  Surgeon: Florene Glen, MD;  Location: [facility];  Service: General;  Laterality: N/A;  . WOUND DEBRIDEMENT N/A 03/09/2015   Procedure: DEBRIDEMENT ABDOMINAL WOUND;  Surgeon: Florene Glen, MD;  Location: [facility];  Service: General;  Laterality: N/A;   Constitutional Exam  General appearance: Well nourished, well developed, and well hydrated. In no apparent acute distress Vitals:   03/12/16 1022  BP: (!) 158/81  Pulse: 67  Resp: 16  Temp: 98.1 F (36.7 C)  TempSrc: Oral  SpO2: 95%  Weight: 214 lb (97.1 kg)  Height: _0  (1.6 m)   BMI Assessment: Estimated body mass index is 37.91 kg/m as calculated from the following:   Height as of this encounter: _1  (1.6 m).   Weight as of this encounter: 214 lb (97.1 kg).  BMI interpretation table: BMI level Category Range association with higher incidence of chronic pain  <18 kg/m2 Underweight   18.5-24.9 kg/m2 Ideal body weight   25-29.9 kg/m2 Overweight Increased incidence by 20%  30-34.9 kg/m2 Obese (Class I) Increased incidence by 68%  35-39.9 kg/m2 Severe obesity (Class II) Increased  incidence by 136%  >40 kg/m2 Extreme obesity (Class III) Increased incidence by 254%   BMI Readings from Last 4 Encounters:  03/12/16 37.91 kg/m  02/26/16 38.58 kg/m  02/05/16 38.62 kg/m  01/22/16 38.26 kg/m   Wt Readings from Last 4 Encounters:  03/12/16 214 lb (97.1 kg)  02/26/16 217 lb 12.8 oz (98.8 kg)  02/05/16 218 lb (98.9 kg)  01/22/16 216 lb (98 kg)  Psych/Mental status: Alert, oriented x 3 (person, place, & time)       Eyes: PERLA Respiratory: No evidence of acute respiratory distress  Cervical Spine Exam  Inspection: No masses, redness, or swelling Alignment: Symmetrical Functional ROM: Unrestricted ROM Stability: No instability detected Muscle strength & Tone: Functionally intact Sensory: Unimpaired Palpation: Non-contributory  Upper Extremity (UE) Exam    Side: Right upper extremity  Side: Left upper extremity  Inspection: No masses, redness, swelling, or asymmetry  Inspection: No masses, redness, swelling, or asymmetry  Functional ROM: Unrestricted ROM          Functional ROM: Unrestricted ROM          Muscle strength & Tone: Functionally intact  Muscle strength & Tone: Functionally intact  Sensory: Unimpaired  Sensory: Unimpaired  Palpation: Non-contributory  Palpation: Non-contributory   Thoracic Spine Exam  Inspection: No masses, redness, or swelling Alignment: Symmetrical Functional ROM: Unrestricted ROM Stability: No instability detected Sensory: Unimpaired Muscle strength & Tone: Functionally intact Palpation: Non-contributory  Lumbar Spine Exam  Inspection: No masses, redness, or swelling Alignment: Symmetrical Functional ROM: Unrestricted ROM Stability: No instability detected Muscle strength & Tone: Functionally intact Sensory: Unimpaired Palpation: Non-contributory Provocative Tests: Lumbar Hyperextension and rotation test: evaluation deferred today       Patrick's Maneuver: evaluation deferred today              Gait & Posture  Assessment  Ambulation: Unassisted Gait: Relatively normal for age and body habitus Posture: WNL   Lower Extremity Exam    Side: Right lower extremity  Side: Left lower extremity  Inspection: No masses, redness, swelling, or asymmetry  Inspection: No masses, redness, swelling, or asymmetry  Functional ROM: Unrestricted ROM          Functional ROM: Unrestricted ROM  Muscle strength & Tone: Functionally intact  Muscle strength & Tone: Functionally intact  Sensory: Unimpaired  Sensory: Unimpaired  Palpation: Non-contributory  Palpation: Non-contributory   Assessment  Primary Diagnosis & Pertinent Problem List: The primary encounter diagnosis was Chronic pain syndrome. Diagnoses of Chronic abdominal pain (Location of Primary Source of Pain) (Bilateral) (L>R), Chronic low back pain (Location of Secondary source of pain) (Bilateral) (L>R), Chronic lower extremity pain (Location of Tertiary source of pain) (Bilateral) (R>L), Lumbar facet syndrome (Bilateral) (L>R), Diabetic peripheral neuropathy ([place]), and Obesity, Class II, BMI 35-39.9 were also pertinent to this visit.  Status Diagnosis  Controlled Controlled Controlled 1. Chronic pain syndrome   2. Chronic abdominal pain (Location of Primary Source of Pain) (Bilateral) (L>R)   3. Chronic low back pain (Location of Secondary source of pain) (Bilateral) (L>R)   4. Chronic lower extremity pain (Location of Tertiary source of pain) (Bilateral) (R>L)   5. Lumbar facet syndrome (Bilateral) (L>R)   6. Diabetic peripheral neuropathy ([facility])   7. Obesity, Class II, BMI 35-39.9      Plan of Care  Pharmacotherapy (Medications Ordered): Meds ordered this encounter  Medications  . oxyCODONE (OXY IR/ROXICODONE) 5 MG immediate release tablet    Sig: Take 1 tablet (5 mg total) by mouth every 8 (eight) hours as needed for severe pain.    Dispense:  90 tablet    Refill:  0    Do not place this medication, or any other prescription from our  practice, on "Automatic Refill". Patient may have prescription filled one day early if pharmacy is closed on scheduled refill date. Do not fill until: 03/12/16 To last until: 04/11/16  . oxyCODONE (OXY IR/ROXICODONE) 5 MG immediate release tablet    Sig: Take 1 tablet (5 mg total) by mouth every 8 (eight) hours as needed for severe pain.    Dispense:  90 tablet    Refill:  0    Do not place this medication, or any other prescription from our practice, on "Automatic Refill". Patient may have prescription filled one day early if pharmacy is closed on scheduled refill date. Do not fill until: 04/11/16 To last until: 05/11/16  . oxyCODONE (OXY IR/ROXICODONE) 5 MG immediate release tablet    Sig: Take 1 tablet (5 mg total) by mouth every 8 (eight) hours as needed for severe pain.    Dispense:  90 tablet    Refill:  0    Do not place this medication, or any other prescription from our practice, on "Automatic Refill". Patient may have prescription filled one day early if pharmacy is closed on scheduled refill date. Do not fill until: 05/11/16 To last until: 06/10/16   New Prescriptions   OXYCODONE (OXY IR/ROXICODONE) 5 MG IMMEDIATE RELEASE TABLET    Take 1 tablet (5 mg total) by mouth every 8 (eight) hours as needed for severe pain.   OXYCODONE (OXY IR/ROXICODONE) 5 MG IMMEDIATE RELEASE TABLET    Take 1 tablet (5 mg total) by mouth every 8 (eight) hours as needed for severe pain.   Medications administered today: Ms. Novosad had no medications administered during this visit. Lab-work, procedure(s), and/or referral(s): Orders Placed This Encounter  Procedures  . Assess   Imaging and/or referral(s): ASSESS  Interventional therapies: Planned, scheduled, and/or pending:   None at this point, her stomach wound has not healed completely.   Considering:   Diagnostic bilateral lumbar facet block under fluoroscopic guidance and IV sedation.  Possible bilateral lumbar facet rate frequency  ablation.  Diagnostic bilateral celiac plexus block under fluoroscopic guidance and IV sedation.  Diagnostic bilateral intra-articular hip injection under fluoroscopic guidance, with or without sedation.  Possible diagnostic bilateral femoral nerve and obturator nerve articular branch blocks under fluoroscopic guidance and IV sedation.  Possible bilateral femoral nerve and obturator nerve articular branch frequency ablation.  Diagnostic bilateral intra-articular knee joint injection under fluoroscopic guidance, no sedation.  Possible series of 5 bilateral intra-articular Hyalgan knee injections.  Bilateral diagnostic genicular nerve block under fluoroscopic guidance, with or without sedation.  Possible bilateral genicular nerve radiofrequency ablation under fluoroscopic guidance and IV sedation.    Palliative PRN treatment(s):   Diagnostic bilateral lumbar facet block under fluoroscopic guidance and IV sedation.  Diagnostic bilateral celiac plexus block under fluoroscopic guidance and IV sedation.  Diagnostic bilateral intra-articular hip injection under fluoroscopic guidance, with or without sedation.  Possible diagnostic bilateral femoral nerve and obturator nerve articular branch blocks under fluoroscopic guidance and IV sedation.  Diagnostic bilateral intra-articular knee joint injection under fluoroscopic guidance, no sedation.  Bilateral diagnostic genicular nerve block under fluoroscopic guidance, with or without sedation.    Provider-requested follow-up: Return in about 3 months (around 06/10/2016) for (MD) Med-Mgmt.  Future Appointments Date Time Provider [place]  03/20/2016 1:45 PM Clayburn Pert, MD [facility] None   Primary Care Physician: Adin Hector, MD Location: [facility] Outpatient Pain Management Facility Note by: Kathlen Brunswick Dossie Arbour, M.D, DABA, DABAPM, DABPM, DABIPP, FIPP Date: 03/12/2016; Time: 12:47 PM  Pain Score Disclaimer: We use the NRS-11 scale. This is a  self-reported, subjective measurement of pain severity with only modest accuracy. It is used primarily to identify changes within a particular patient. It must be understood that outpatient pain scales are significantly less accurate that those used for research, where they can be applied under ideal controlled circumstances with minimal exposure to variables. In reality, the score is likely to be a combination of pain intensity and pain affect, where pain affect describes the degree of emotional arousal or changes in action readiness caused by the sensory experience of pain. Factors such as social and work situation, setting, emotional state, anxiety levels, expectation, and prior pain experience may influence pain perception and show large inter-individual differences that may also be affected by time variables.  Patient instructions provided during this appointment: There are no Patient Instructions on file for this visit.

## 2016-03-12 ENCOUNTER — Ambulatory Visit: Payer: Medicare Other | Attending: Pain Medicine | Admitting: Pain Medicine

## 2016-03-12 ENCOUNTER — Encounter: Payer: Self-pay | Admitting: Pain Medicine

## 2016-03-12 VITALS — BP 158/81 | HR 67 | Temp 98.1°F | Resp 16 | Ht 63.0 in | Wt 214.0 lb

## 2016-03-12 DIAGNOSIS — E1122 Type 2 diabetes mellitus with diabetic chronic kidney disease: Secondary | ICD-10-CM | POA: Diagnosis not present

## 2016-03-12 DIAGNOSIS — Z87891 Personal history of nicotine dependence: Secondary | ICD-10-CM | POA: Diagnosis not present

## 2016-03-12 DIAGNOSIS — M79662 Pain in left lower leg: Secondary | ICD-10-CM | POA: Diagnosis not present

## 2016-03-12 DIAGNOSIS — I350 Nonrheumatic aortic (valve) stenosis: Secondary | ICD-10-CM | POA: Diagnosis not present

## 2016-03-12 DIAGNOSIS — Z794 Long term (current) use of insulin: Secondary | ICD-10-CM | POA: Diagnosis not present

## 2016-03-12 DIAGNOSIS — Z79891 Long term (current) use of opiate analgesic: Secondary | ICD-10-CM | POA: Insufficient documentation

## 2016-03-12 DIAGNOSIS — D638 Anemia in other chronic diseases classified elsewhere: Secondary | ICD-10-CM | POA: Diagnosis not present

## 2016-03-12 DIAGNOSIS — I129 Hypertensive chronic kidney disease with stage 1 through stage 4 chronic kidney disease, or unspecified chronic kidney disease: Secondary | ICD-10-CM | POA: Insufficient documentation

## 2016-03-12 DIAGNOSIS — Z90710 Acquired absence of both cervix and uterus: Secondary | ICD-10-CM | POA: Diagnosis not present

## 2016-03-12 DIAGNOSIS — M79661 Pain in right lower leg: Secondary | ICD-10-CM | POA: Insufficient documentation

## 2016-03-12 DIAGNOSIS — E669 Obesity, unspecified: Secondary | ICD-10-CM | POA: Diagnosis not present

## 2016-03-12 DIAGNOSIS — M79604 Pain in right leg: Secondary | ICD-10-CM

## 2016-03-12 DIAGNOSIS — E039 Hypothyroidism, unspecified: Secondary | ICD-10-CM | POA: Insufficient documentation

## 2016-03-12 DIAGNOSIS — Z888 Allergy status to other drugs, medicaments and biological substances status: Secondary | ICD-10-CM | POA: Diagnosis not present

## 2016-03-12 DIAGNOSIS — Z9049 Acquired absence of other specified parts of digestive tract: Secondary | ICD-10-CM | POA: Insufficient documentation

## 2016-03-12 DIAGNOSIS — Z8249 Family history of ischemic heart disease and other diseases of the circulatory system: Secondary | ICD-10-CM | POA: Insufficient documentation

## 2016-03-12 DIAGNOSIS — M47816 Spondylosis without myelopathy or radiculopathy, lumbar region: Secondary | ICD-10-CM

## 2016-03-12 DIAGNOSIS — M5441 Lumbago with sciatica, right side: Secondary | ICD-10-CM

## 2016-03-12 DIAGNOSIS — R109 Unspecified abdominal pain: Secondary | ICD-10-CM | POA: Diagnosis not present

## 2016-03-12 DIAGNOSIS — E1142 Type 2 diabetes mellitus with diabetic polyneuropathy: Secondary | ICD-10-CM | POA: Insufficient documentation

## 2016-03-12 DIAGNOSIS — M545 Low back pain: Secondary | ICD-10-CM | POA: Insufficient documentation

## 2016-03-12 DIAGNOSIS — N183 Chronic kidney disease, stage 3 (moderate): Secondary | ICD-10-CM | POA: Insufficient documentation

## 2016-03-12 DIAGNOSIS — E785 Hyperlipidemia, unspecified: Secondary | ICD-10-CM | POA: Diagnosis not present

## 2016-03-12 DIAGNOSIS — M25552 Pain in left hip: Secondary | ICD-10-CM | POA: Diagnosis not present

## 2016-03-12 DIAGNOSIS — Z833 Family history of diabetes mellitus: Secondary | ICD-10-CM | POA: Diagnosis not present

## 2016-03-12 DIAGNOSIS — Z885 Allergy status to narcotic agent status: Secondary | ICD-10-CM | POA: Insufficient documentation

## 2016-03-12 DIAGNOSIS — M1288 Other specific arthropathies, not elsewhere classified, other specified site: Secondary | ICD-10-CM

## 2016-03-12 DIAGNOSIS — G894 Chronic pain syndrome: Secondary | ICD-10-CM | POA: Diagnosis not present

## 2016-03-12 DIAGNOSIS — G8929 Other chronic pain: Secondary | ICD-10-CM

## 2016-03-12 DIAGNOSIS — M5442 Lumbago with sciatica, left side: Secondary | ICD-10-CM | POA: Diagnosis not present

## 2016-03-12 DIAGNOSIS — M79605 Pain in left leg: Secondary | ICD-10-CM

## 2016-03-12 DIAGNOSIS — Z6837 Body mass index (BMI) 37.0-37.9, adult: Secondary | ICD-10-CM | POA: Diagnosis not present

## 2016-03-12 MED ORDER — OXYCODONE HCL 5 MG PO TABS: 5.0000 mg | ORAL_TABLET | Freq: Three times a day (TID) | ORAL | 0 refills | Status: DC | PRN

## 2016-03-12 MED ORDER — OXYCODONE HCL 5 MG PO TABS
5.0000 mg | ORAL_TABLET | Freq: Three times a day (TID) | ORAL | 0 refills | Status: DC | PRN
Start: 2016-04-11 — End: 2016-06-06

## 2016-03-12 MED ORDER — OXYCODONE HCL 5 MG PO TABS
5.0000 mg | ORAL_TABLET | Freq: Three times a day (TID) | ORAL | 0 refills | Status: DC | PRN
Start: 2016-05-11 — End: 2016-06-06

## 2016-03-12 NOTE — Progress Notes (Signed)
Nursing Pain Medication Assessment:  Safety precautions to be maintained throughout the outpatient stay will include: orient to surroundings, keep bed in low position, maintain call bell within reach at all times, provide assistance with transfer out of bed and ambulation.  Medication Inspection Compliance: Pill count conducted under aseptic conditions, in front of the patient. Neither the pills nor the bottle was removed from the patient's sight at any time. Once count was completed pills were immediately returned to the patient in their original bottle.  Medication: Morphine IR Pill Count: 23 of 90 pills remain Bottle Appearance: Standard pharmacy container. Clearly labeled. Filled Date: 01 / 04/ 2017 Medication last intake: 03-11-16 at 2200

## 2016-03-20 ENCOUNTER — Ambulatory Visit (INDEPENDENT_AMBULATORY_CARE_PROVIDER_SITE_OTHER): Payer: Medicare Other | Admitting: General Surgery

## 2016-03-20 ENCOUNTER — Encounter: Payer: Self-pay | Admitting: General Surgery

## 2016-03-20 VITALS — BP 160/89 | HR 71 | Temp 98.1°F | Ht 63.0 in | Wt 219.2 lb

## 2016-03-20 DIAGNOSIS — Z9889 Other specified postprocedural states: Secondary | ICD-10-CM

## 2016-03-20 DIAGNOSIS — IMO0002 Reserved for concepts with insufficient information to code with codable children: Secondary | ICD-10-CM

## 2016-03-20 NOTE — Patient Instructions (Signed)
Please call our office if you have questions or concerns. We would like for you to follow up with Dr. Tiffany Kocher in April 2018.

## 2016-03-20 NOTE — Progress Notes (Signed)
Outpatient Surgical Follow Up  03/20/2016  [name] is an 66 y.o. female.   Chief Complaint  Patient presents with  . Follow-up    Colostomy Takedown-07/12/15-Dr.Averey Trompeter-Wound Check    HPI: 66 year old female returns to clinic for additional follow-up due to a slow healing area from her midline wound secondary to her colostomy reversal. Patient reports that for the last week there is been no drainage and she is no longer needed to keep the dressing there. She denies any fevers, chills, nausea, vomiting, chest pain, shortness of breath, diarrhea, constipation. She's had a return of energy lately and is reporting doing very well.  Past Medical History:  Diagnosis Date  . Anemia   . Anxiety   . Aortic valve stenosis   . Arthritis    feet, legs  . Bowel obstruction   . CKD (chronic kidney disease)    protein in urine  . Colostomy in place [facility])   . Diabetes mellitus without complication ([place])   . Diverticulitis large intestine   . Dysrhythmia   . Ectopic atrial tachycardia ([place])   . Gastritis   . GI bleed   . Heart murmur   . Hyperlipidemia   . Hypertension   . Hypothyroidism   . MRSA (methicillin resistant Staphylococcus aureus)    at abdominal wound.  Jan 2017.  Treated.   . Neuropathy ([place])   . Neuropathy in diabetes ([place])   . Obesity   . Shortness of breath dyspnea   . Vertigo     Past Surgical History:  Procedure Laterality Date  . ABDOMINAL HYSTERECTOMY    . APPLICATION OF WOUND VAC N/A 02/24/2015   Procedure: APPLICATION OF WOUND VAC;  Surgeon: Clayburn Pert, MD;  Location: [facility];  Service: General;  Laterality: N/A;  . APPLICATION OF WOUND VAC N/A 02/26/2015   Procedure: APPLICATION OF WOUND VAC;  Surgeon: Clayburn Pert, MD;  Location: [facility];  Service: General;  Laterality: N/A;  . BACK SURGERY     spur frmoved from lower back  . CARDIAC CATHETERIZATION    . COLECTOMY    . COLOSTOMY REVERSAL N/A 07/12/2015   Procedure: COLOSTOMY REVERSAL;  Surgeon:  Clayburn Pert, MD;  Location: [facility];  Service: General;  Laterality: N/A;  . DEBRIDEMENT OF ABDOMINAL WALL ABSCESS N/A 02/22/2015   Procedure: DEBRIDEMENT OF ABDOMINAL WALL ABSCESS;  Surgeon: Clayburn Pert, MD;  Location: [facility];  Service: General;  Laterality: N/A;  . EXCISION MASS ABDOMINAL N/A 02/24/2015   Procedure: EXCISION MASS ABDOMINAL  / [place];  Surgeon: Clayburn Pert, MD;  Location: [facility];  Service: General;  Laterality: N/A;  . EXCISION MASS ABDOMINAL N/A 02/26/2015   Procedure: EXCISION MASS ABDOMINAL/wash out;  Surgeon: Clayburn Pert, MD;  Location: [facility];  Service: General;  Laterality: N/A;  . FLEXIBLE SIGMOIDOSCOPY  06/19/2015   Procedure: FLEXIBLE SIGMOIDOSCOPY;  Surgeon: Lucilla Lame, MD;  Location: [place];  Service: Endoscopy;;  UNABLE TO ACCESS OSTOMY SITE FOR ACCESS INTO COLON  . LAPAROSCOPY  07/12/2015   Procedure: LAPAROSCOPY DIAGNOSTIC;  Surgeon: Clayburn Pert, MD;  Location: [facility];  Service: General;;  . LAPAROTOMY  07/12/2015   Procedure: EXPLORATORY LAPAROTOMY;  Surgeon: Clayburn Pert, MD;  Location: [facility];  Service: General;;  . LAPAROTOMY N/A 02/06/2015   Procedure: Laparotomy, reduction of incarcerated parastomal hernia, repair of parastomal hernia with mesh;  Surgeon: Sherri Rad, MD;  Location: [facility];  Service: General;  Laterality: N/A;  . laparotomy closure of cecal perforation  05/09/2013   Dr. Marina Gravel  . LYSIS OF ADHESION  07/12/2015   Procedure: LYSIS OF ADHESION;  Surgeon: Clayburn Pert, MD;  Location: [facility];  Service: General;;  . TONSILLECTOMY    . WOUND DEBRIDEMENT N/A 03/03/2015   Procedure: DEBRIDEMENT ABDOMINAL WOUND;  Surgeon: Florene Glen, MD;  Location: [facility];  Service: General;  Laterality: N/A;  . WOUND DEBRIDEMENT N/A 03/09/2015   Procedure: DEBRIDEMENT ABDOMINAL WOUND;  Surgeon: Florene Glen, MD;  Location: [facility];  Service: General;  Laterality: N/A;    Family History  Problem Relation Age of  Onset  . Diabetes Mother   . Hypertension Father     Social History:  reports that she quit smoking about 2 years ago. Her smoking use included Cigarettes. She has never used smokeless tobacco. She reports that she does not drink alcohol or use drugs.  Allergies:  Allergies  Allergen Reactions  . Buspirone Other (See Comments)    Weakness  . Citalopram Other (See Comments)  . Lisinopril Cough  . Metformin Diarrhea    At 1000 mg dose  . Pravastatin Other (See Comments)    insomnia  . Sitagliptin Other (See Comments)    constipation  . Tramadol Itching  . Diltiazem Hcl Palpitations  . Gabapentin Palpitations  . Hydralazine Rash  . Lovastatin Palpitations    Medications reviewed.    ROS A multipoint review of systems was completed, all pertinent positives and negatives are documented within the history of present illness and remainder are negative.   BP (!) 160/89   Pulse 71   Temp 98.1 F (36.7 C) (Oral)   Ht 5\' 3"  (1.6 m)   Wt 99.4 kg (219 lb 3.2 oz)   BMI 38.83 kg/m   Physical Exam Gen.: No acute distress Chest: Clear to auscultation Heart: Regular rhythm Abdomen: Large, soft, nontender. Midline wound appears to be healed. Previous areas of drainage had epithelialized without any evidence of palpable fluid collection or expressible fluid. Previous colostomy site well approximated and healed.    No results found for this or any previous visit (from the past 48 hour(s)). No results found.  Assessment/Plan:  1. H/O colostomy 66 year old female status post open colostomy reversal. She appears to finally healed her midline wound. Discussed the signs and symptoms of infection and return to clinic immediately should there be any changes. She'll follow-up in clinic on an as-needed basis. Discussed that when she reaches the one-year anniversary from her colostomy reversal she should be seen by GI again for a repeat colonoscopy. Patient voiced understanding and will  follow up on an as-needed basis.  A total of 15 minutes was used on this encounter with greater than 50% of it used for counseling and coordination of care.   Clayburn Pert, MD FACS General Surgeon  03/20/2016,1:43 PM

## 2016-03-28 ENCOUNTER — Telehealth: Payer: Self-pay

## 2016-03-28 NOTE — Telephone Encounter (Signed)
Dr. Adonis Huguenin told the patient that she is due for a colonoscopy. She called her GI and they told her she isn't supposed to have one until 2020. Patient would like to know what her next steps are as far as this. Please call patient and advice

## 2016-03-28 NOTE — Telephone Encounter (Signed)
Spoke with patient at this time. She stated she has called [facility] to let them know that Dr.Woodham stated she is to have a Colonoscopy at her 1 year Colostomy reversal anniversary ( April 2018)  and  she was told she did not need one until 2020.   She asked if I would contact them tomorrow and I told her I would do so and call her back.

## 2016-03-29 NOTE — Telephone Encounter (Signed)
Spoke with Butch Penny at [facility] at this time. In regards to patient having a Colonoscopy at the one year anniversary of Colostomy reversal.  She asked to have the last office visit notes from Dr.Woodham  faxed over in regards of by passing the 5 year mark for the Colonoscopy.  Notes faxed at this time.  Spoke with patient to let her know the notes have been sent and to be expecting a call from their office.

## 2016-03-29 NOTE — Telephone Encounter (Signed)
Patient has appointment with Dawson Bills NP at [facility] to discuss Colonoscopy 06/14/16 @ 9:30 am

## 2016-05-15 ENCOUNTER — Other Ambulatory Visit: Payer: Self-pay | Admitting: General Surgery

## 2016-06-06 ENCOUNTER — Encounter: Payer: Self-pay | Admitting: Pain Medicine

## 2016-06-06 ENCOUNTER — Ambulatory Visit: Payer: Medicare Other | Attending: Pain Medicine | Admitting: Pain Medicine

## 2016-06-06 VITALS — BP 130/81 | HR 72 | Temp 98.1°F | Resp 20 | Ht 63.0 in | Wt 215.0 lb

## 2016-06-06 DIAGNOSIS — M79604 Pain in right leg: Secondary | ICD-10-CM | POA: Diagnosis not present

## 2016-06-06 DIAGNOSIS — E669 Obesity, unspecified: Secondary | ICD-10-CM | POA: Diagnosis not present

## 2016-06-06 DIAGNOSIS — I350 Nonrheumatic aortic (valve) stenosis: Secondary | ICD-10-CM | POA: Insufficient documentation

## 2016-06-06 DIAGNOSIS — Z6838 Body mass index (BMI) 38.0-38.9, adult: Secondary | ICD-10-CM | POA: Diagnosis not present

## 2016-06-06 DIAGNOSIS — M5442 Lumbago with sciatica, left side: Secondary | ICD-10-CM | POA: Insufficient documentation

## 2016-06-06 DIAGNOSIS — E1142 Type 2 diabetes mellitus with diabetic polyneuropathy: Secondary | ICD-10-CM | POA: Insufficient documentation

## 2016-06-06 DIAGNOSIS — N183 Chronic kidney disease, stage 3 (moderate): Secondary | ICD-10-CM | POA: Insufficient documentation

## 2016-06-06 DIAGNOSIS — Z9889 Other specified postprocedural states: Secondary | ICD-10-CM | POA: Diagnosis not present

## 2016-06-06 DIAGNOSIS — K56609 Unspecified intestinal obstruction, unspecified as to partial versus complete obstruction: Secondary | ICD-10-CM | POA: Diagnosis not present

## 2016-06-06 DIAGNOSIS — F1190 Opioid use, unspecified, uncomplicated: Secondary | ICD-10-CM

## 2016-06-06 DIAGNOSIS — M5441 Lumbago with sciatica, right side: Secondary | ICD-10-CM | POA: Insufficient documentation

## 2016-06-06 DIAGNOSIS — E1122 Type 2 diabetes mellitus with diabetic chronic kidney disease: Secondary | ICD-10-CM | POA: Insufficient documentation

## 2016-06-06 DIAGNOSIS — Z87891 Personal history of nicotine dependence: Secondary | ICD-10-CM | POA: Diagnosis not present

## 2016-06-06 DIAGNOSIS — Z79891 Long term (current) use of opiate analgesic: Secondary | ICD-10-CM | POA: Insufficient documentation

## 2016-06-06 DIAGNOSIS — Z888 Allergy status to other drugs, medicaments and biological substances status: Secondary | ICD-10-CM | POA: Insufficient documentation

## 2016-06-06 DIAGNOSIS — Z9049 Acquired absence of other specified parts of digestive tract: Secondary | ICD-10-CM | POA: Diagnosis not present

## 2016-06-06 DIAGNOSIS — R109 Unspecified abdominal pain: Secondary | ICD-10-CM | POA: Diagnosis not present

## 2016-06-06 DIAGNOSIS — E785 Hyperlipidemia, unspecified: Secondary | ICD-10-CM | POA: Diagnosis not present

## 2016-06-06 DIAGNOSIS — Z881 Allergy status to other antibiotic agents status: Secondary | ICD-10-CM | POA: Insufficient documentation

## 2016-06-06 DIAGNOSIS — M79605 Pain in left leg: Secondary | ICD-10-CM | POA: Insufficient documentation

## 2016-06-06 DIAGNOSIS — G8929 Other chronic pain: Secondary | ICD-10-CM | POA: Diagnosis not present

## 2016-06-06 DIAGNOSIS — E039 Hypothyroidism, unspecified: Secondary | ICD-10-CM | POA: Insufficient documentation

## 2016-06-06 DIAGNOSIS — D631 Anemia in chronic kidney disease: Secondary | ICD-10-CM | POA: Diagnosis not present

## 2016-06-06 DIAGNOSIS — F419 Anxiety disorder, unspecified: Secondary | ICD-10-CM | POA: Insufficient documentation

## 2016-06-06 DIAGNOSIS — I129 Hypertensive chronic kidney disease with stage 1 through stage 4 chronic kidney disease, or unspecified chronic kidney disease: Secondary | ICD-10-CM | POA: Insufficient documentation

## 2016-06-06 DIAGNOSIS — G894 Chronic pain syndrome: Secondary | ICD-10-CM | POA: Diagnosis not present

## 2016-06-06 DIAGNOSIS — Z794 Long term (current) use of insulin: Secondary | ICD-10-CM | POA: Diagnosis not present

## 2016-06-06 MED ORDER — OXYCODONE HCL 5 MG PO TABS: 5.0000 mg | ORAL_TABLET | Freq: Three times a day (TID) | ORAL | 0 refills | Status: DC | PRN

## 2016-06-06 NOTE — Patient Instructions (Signed)

## 2016-06-06 NOTE — Progress Notes (Signed)
Nursing Pain Medication Assessment:  Safety precautions to be maintained throughout the outpatient stay will include: orient to surroundings, keep bed in low position, maintain call bell within reach at all times, provide assistance with transfer out of bed and ambulation.  Medication Inspection Compliance: Pill count conducted under aseptic conditions, in front of the patient. Neither the pills nor the bottle was removed from the patient's sight at any time. Once count was completed pills were immediately returned to the patient in their original bottle.  Medication: See above Pill/Patch Count: 29 of 90 pills remain Pill/Patch Appearance: Markings consistent with prescribed medication Bottle Appearance: Standard pharmacy container. Clearly labeled. Filled Date: 04 / 11 / 2018 Last Medication intake:  Yesterday

## 2016-06-06 NOTE — Progress Notes (Signed)
Patient's Name: [name]  MRN: [id number]  Referring Provider: Adin Hector, MD  DOB: Jun 14, 1950  PCP: Adin Hector, MD  DOS: 06/06/2016  Note by: Kathlen Brunswick. Dossie Arbour, MD  Service setting: Ambulatory outpatient  Specialty: Interventional Pain Management  Location: [facility] (AMB) Pain Management Facility    Patient type: Established   Primary Reason(s) for Visit: Encounter for prescription drug management (Level of risk: moderate) CC: Back Pain (low and midline) and Knee Pain (bilateral)  HPI  Ms. Doxtater is a 66 y.o. year old, female patient, who comes today for a medication management evaluation. She has Obesity; Small bowel obstruction ([place]); Absolute anemia; Aortic heart valve narrowing; Arthritis; B12 deficiency; Benign essential HTN; Chronic kidney disease (CKD), stage III (moderate); Focal and segmental hyalinosis; Bleeding gastrointestinal; Acquired atrophy of thyroid; Abnormal presence of protein in urine; Psoriasis; Paroxysmal supraventricular tachycardia ([place]); Breathlessness on exertion; Type 2 diabetes mellitus ([place]); Chronic abdominal pain (Location of Primary Source of Pain) (Bilateral) (L>R); Long term current use of opiate analgesic; Long term prescription opiate use; Opiate use (22.5 MME/Day); Encounter for therapeutic drug level monitoring; Encounter for pain management planning; Neurogenic pain; Musculoskeletal pain; Visceral abdominal pain; Diabetic peripheral neuropathy ([place]); Chronic low back pain (Location of Secondary source of pain) (Bilateral) (L>R); Chronic lower extremity pain (Location of Tertiary source of pain) (Bilateral) (R>L); Chronic hip pain (Bilateral) (L>R); Chronic knee pain (Bilateral) (L>R); Chronic neck pain (Bilateral) (L>R); H/O colostomy; Diabetic neuropathy ([place]); Recurrent major depressive disorder, in full remission ([place]); Lumbar spondylosis; Lumbar facet syndrome (Bilateral) (L>R); Polyneuropathy; Heart burn; Osteopenia of multiple sites; FSGS (focal  segmental glomerulosclerosis); Chronic pain syndrome; and Obesity, Class II, BMI 35-39.9 on her problem list. Her primarily concern today is the Back Pain (low and midline) and Knee Pain (bilateral)  Pain Assessment: Self-Reported Pain Score: 5 /10 Clinically the patient looks like a 1/10 Reported level is inconsistent with clinical observations. Information on the proper use of the pain scale provided to the patient today Pain Location: Back Pain Orientation: Lower Pain Descriptors / Indicators: Constant, Aching Pain Frequency: Intermittent  Ms. Camino was last scheduled for an appointment on 03/12/2016 for medication management. During today's appointment we reviewed Ms. Dusing's chronic pain status, as well as her outpatient medication regimen.  The patient  reports that she does not use drugs. Her body mass index is 38.09 kg/m.  Further details on both, my assessment(s), as well as the proposed treatment plan, please see below.  Controlled Substance Pharmacotherapy Assessment REMS (Risk Evaluation and Mitigation Strategy)  Analgesic:Oxycodone IR 63m every 8hours (136mday) MME/day:22.19m52may   JenAngelique HolmN  06/06/2016  1:15 PM  Sign at close encounter Nursing Pain Medication Assessment:  Safety precautions to be maintained throughout the outpatient stay will include: orient to surroundings, keep bed in low position, maintain call bell within reach at all times, provide assistance with transfer out of bed and ambulation.  Medication Inspection Compliance: Pill count conducted under aseptic conditions, in front of the patient. Neither the pills nor the bottle was removed from the patient's sight at any time. Once count was completed pills were immediately returned to the patient in their original bottle.  Medication: See above Pill/Patch Count: 29 of 90 pills remain Pill/Patch Appearance: Markings consistent with prescribed medication Bottle Appearance: Standard pharmacy  container. Clearly labeled. Filled Date: 04 / 11 / 2018 Last Medication intake:  Yesterday   Pharmacokinetics: Liberation and absorption (onset of action): WNL Distribution (time to peak effect): WNL Metabolism  and excretion (duration of action): WNL         Pharmacodynamics: Desired effects: Analgesia: Ms. Schwalb reports >50% benefit. Functional ability: Patient reports that medication allows her to accomplish basic ADLs Clinically meaningful improvement in function (CMIF): Sustained CMIF goals met Perceived effectiveness: Described as relatively effective, allowing for increase in activities of daily living (ADL) Undesirable effects: Side-effects or Adverse reactions: None reported Monitoring: [place] PMP: Online review of the past 48-monthperiod conducted. Compliant with practice rules and regulations List of all UDS test(s) done:  Lab Results  Component Value Date   TOXASSSELUR FINAL 11/07/2015   SUMMARY FINAL 07/03/2015   Last UDS on record: ToxAssure Select 13  Date Value Ref Range Status  11/07/2015 FINAL  Final    Comment:    ==================================================================== TOXASSURE SELECT 13 (MW) ==================================================================== Test                             Result       Flag       Units Drug Present and Declared for Prescription Verification   Oxycodone                      241          EXPECTED   ng/mg creat   Oxymorphone                    288          EXPECTED   ng/mg creat   Noroxycodone                   191          EXPECTED   ng/mg creat   Noroxymorphone                 89           EXPECTED   ng/mg creat    Sources of oxycodone are scheduled prescription medications.    Oxymorphone, noroxycodone, and noroxymorphone are expected    metabolites of oxycodone. Oxymorphone is also available as a    scheduled prescription medication. ==================================================================== Test                       Result    Flag   Units      Ref Range   Creatinine              56               mg/dL      >=20 ==================================================================== Declared Medications:  The flagging and interpretation on this report are based on the  following declared medications.  Unexpected results may arise from  inaccuracies in the declared medications.  **Note: The testing scope of this panel includes these medications:  Oxycodone  **Note: The testing scope of this panel does not include following  reported medications:  Amiodarone (Pacerone)  Amlodipine (Norvasc)  Atorvastatin (Lipitor)  Bisacodyl  Calcium Carbonate (Tums)  Clobetasol (Temovate)  Duloxetine (Cymbalta)  Gabapentin (Neurontin)  Glimepiride (Amaryl)  Insulin (Lantus)  Levothyroxine (Levothroid)  Levothyroxine (Synthroid)  Metoprolol (Toprol)  Telmisartan (Micardis)  Vitamin D2 (Drisdol) ==================================================================== For clinical consultation, please call [phone] ====================================================================    UDS interpretation: Compliant          Medication Assessment Form: Reviewed. Patient indicates being compliant with therapy Treatment compliance: Compliant Risk Assessment Profile: Aberrant behavior:  See prior evaluations. None observed or detected today Comorbid factors increasing risk of overdose: See prior notes. No additional risks detected today Risk of substance use disorder (SUD): Low Opioid Risk Tool (ORT) Total Score: 0  Interpretation Table:  Score <3 = Low Risk for SUD  Score between 4-7 = Moderate Risk for SUD  Score >8 = High Risk for Opioid Abuse   Risk Mitigation Strategies:  Patient Counseling: Covered Patient-Prescriber Agreement (PPA): Present and active  Notification to other healthcare providers: Done  Pharmacologic Plan: No change in therapy, at this time  Laboratory Chemistry   Inflammation Markers Lab Results  Component Value Date   CRP 0.6 11/07/2015   ESRSEDRATE 16 11/07/2015   (CRP: Acute Phase) (ESR: Chronic Phase) Renal Function Markers Lab Results  Component Value Date   BUN 33 (H) 07/22/2015   CREATININE 2.33 (H) 07/22/2015   GFRAA 24 (L) 07/22/2015   GFRNONAA 21 (L) 07/22/2015   Hepatic Function Markers Lab Results  Component Value Date   AST 14 (L) 02/22/2015   ALT 13 (L) 02/22/2015   ALBUMIN 2.5 (L) 07/21/2015   ALKPHOS 86 02/22/2015   Electrolytes Lab Results  Component Value Date   NA 138 07/22/2015   K 4.2 07/22/2015   CL 110 07/22/2015   CALCIUM 7.6 (L) 07/22/2015   MG 2.2 11/07/2015   Neuropathy Markers Lab Results  Component Value Date   VITAMINB12 273 11/07/2015   Bone Pathology Markers Lab Results  Component Value Date   ALKPHOS 86 02/22/2015   25OHVITD1 43 11/07/2015   25OHVITD2 38 11/07/2015   25OHVITD3 5.4 11/07/2015   CALCIUM 7.6 (L) 07/22/2015   Coagulation Parameters Lab Results  Component Value Date   INR 1.28 02/22/2015   LABPROT 16.1 (H) 02/22/2015   APTT 36 02/22/2015   PLT 315 07/22/2015   Cardiovascular Markers Lab Results  Component Value Date   HGB 8.9 (L) 07/22/2015   HCT 28.0 (L) 07/22/2015   Note: Lab results reviewed.  Recent Diagnostic Imaging Review  No results found. Note: Imaging results reviewed.          Meds  The patient has a current medication list which includes the following prescription(s): amlodipine, atorvastatin, calcitriol, clobetasol cream, duloxetine, vitamin d2, famotidine, glimepiride, glucose blood, insulin glargine, levothyroxine, metoprolol succinate, nystatin, onetouch delica lancets 73Z, oxycodone, oxycodone, oxycodone, telmisartan, and vitamin d (ergocalciferol).  Current Outpatient Prescriptions on File Prior to Visit  Medication Sig  . amLODipine (NORVASC) 5 MG tablet Take 1 tablet (5 mg total) by mouth daily.  Marland Kitchen atorvastatin (LIPITOR) 40 MG tablet Take  40 mg by mouth daily at 6 PM.   . calcitRIOL (ROCALTROL) 0.25 MCG capsule Take 1 capsule by mouth every other day.  . clobetasol cream (TEMOVATE) 3.29 % Apply 1 application topically daily.  . DULoxetine (CYMBALTA) 30 MG capsule Take 30 mg by mouth daily.   . Ergocalciferol (VITAMIN D2) 2000 units TABS Take 1.25 mg by mouth once a week.   . famotidine (PEPCID) 20 MG tablet Take 1 tablet (20 mg total) by mouth 2 (two) times daily.  Marland Kitchen glimepiride (AMARYL) 4 MG tablet Take 2 mg by mouth at bedtime. 1 tab AM, 1/2 tab PM  . glucose blood (ONE TOUCH ULTRA TEST) test strip TEST TWICE DAILY  . insulin glargine (LANTUS) 100 UNIT/ML injection Inject 14 Units into the skin at bedtime.   Marland Kitchen levothyroxine (SYNTHROID, LEVOTHROID) 88 MCG tablet TAKE 1 TABLET BY MOUTH ONCE A DAY FOR THYROID  .  metoprolol succinate (TOPROL-XL) 25 MG 24 hr tablet TAKE 1 TABLET BY MOUTH ONCE A DAY  . nystatin (MYCOSTATIN/NYSTOP) powder Apply topically 4 (four) times daily.  Glory Rosebush DELICA LANCETS 86V MISC TEST TWICE DAILY  . telmisartan (MICARDIS) 80 MG tablet TAKE 1 TABLET BY MOUTH ONCE A DAY  . Vitamin D, Ergocalciferol, (DRISDOL) 50000 units CAPS capsule Take 50,000 Units by mouth every 7 (seven) days.    No current facility-administered medications on file prior to visit.    ROS  Constitutional: Denies any fever or chills Gastrointestinal: No reported hemesis, hematochezia, vomiting, or acute GI distress Musculoskeletal: Denies any acute onset joint swelling, redness, loss of ROM, or weakness Neurological: No reported episodes of acute onset apraxia, aphasia, dysarthria, agnosia, amnesia, paralysis, loss of coordination, or loss of consciousness  Allergies  Ms. Honeyman is allergic to buspirone; citalopram; lisinopril; metformin; pravastatin; sitagliptin; tramadol; diltiazem hcl; gabapentin; hydralazine; and lovastatin.  [place]  Drug: Ms. Kanaan  reports that she does not use drugs. Alcohol:  reports that she does not  drink alcohol. Tobacco:  reports that she quit smoking about 3 years ago. Her smoking use included Cigarettes. She has never used smokeless tobacco. Medical:  has a past medical history of Anemia; Anxiety; Aortic valve stenosis; Arthritis; Bowel obstruction ([place]); CKD (chronic kidney disease); Colostomy in place [facility]); Diabetes mellitus without complication ([place]); Diverticulitis large intestine; Dysrhythmia; Ectopic atrial tachycardia ([place]); Gastritis; GI bleed; Heart murmur; Hyperlipidemia; Hypertension; Hypothyroidism; MRSA (methicillin resistant Staphylococcus aureus); Neuropathy; Neuropathy in diabetes ([place]); Obesity; Shortness of breath dyspnea; and Vertigo. Family: family history includes Diabetes in her mother; Hypertension in her father.  Past Surgical History:  Procedure Laterality Date  . ABDOMINAL HYSTERECTOMY    . APPLICATION OF WOUND VAC N/A 02/24/2015   Procedure: APPLICATION OF WOUND VAC;  Surgeon: Clayburn Pert, MD;  Location: [facility];  Service: General;  Laterality: N/A;  . APPLICATION OF WOUND VAC N/A 02/26/2015   Procedure: APPLICATION OF WOUND VAC;  Surgeon: Clayburn Pert, MD;  Location: [facility];  Service: General;  Laterality: N/A;  . BACK SURGERY     spur frmoved from lower back  . CARDIAC CATHETERIZATION    . COLECTOMY    . COLOSTOMY REVERSAL N/A 07/12/2015   Procedure: COLOSTOMY REVERSAL;  Surgeon: Clayburn Pert, MD;  Location: [facility];  Service: General;  Laterality: N/A;  . DEBRIDEMENT OF ABDOMINAL WALL ABSCESS N/A 02/22/2015   Procedure: DEBRIDEMENT OF ABDOMINAL WALL ABSCESS;  Surgeon: Clayburn Pert, MD;  Location: [facility];  Service: General;  Laterality: N/A;  . EXCISION MASS ABDOMINAL N/A 02/24/2015   Procedure: EXCISION MASS ABDOMINAL  / [place];  Surgeon: Clayburn Pert, MD;  Location: [facility];  Service: General;  Laterality: N/A;  . EXCISION MASS ABDOMINAL N/A 02/26/2015   Procedure: EXCISION MASS ABDOMINAL/wash out;  Surgeon: Clayburn Pert, MD;  Location:  [facility];  Service: General;  Laterality: N/A;  . FLEXIBLE SIGMOIDOSCOPY  06/19/2015   Procedure: FLEXIBLE SIGMOIDOSCOPY;  Surgeon: Lucilla Lame, MD;  Location: [facility];  Service: Endoscopy;;  UNABLE TO ACCESS OSTOMY SITE FOR ACCESS INTO COLON  . LAPAROSCOPY  07/12/2015   Procedure: LAPAROSCOPY DIAGNOSTIC;  Surgeon: Clayburn Pert, MD;  Location: [facility];  Service: General;;  . LAPAROTOMY  07/12/2015   Procedure: EXPLORATORY LAPAROTOMY;  Surgeon: Clayburn Pert, MD;  Location: [facility];  Service: General;;  . LAPAROTOMY N/A 02/06/2015   Procedure: Laparotomy, reduction of incarcerated parastomal hernia, repair of parastomal hernia with mesh;  Surgeon: Sherri Rad, MD;  Location: [facility];  Service: General;  Laterality: N/A;  . laparotomy closure of cecal perforation  05/09/2013   Dr. Marina [place]  . LYSIS OF ADHESION  07/12/2015   Procedure: LYSIS OF ADHESION;  Surgeon: Clayburn Pert, MD;  Location: [facility];  Service: General;;  . TONSILLECTOMY    . WOUND DEBRIDEMENT N/A 03/03/2015   Procedure: DEBRIDEMENT ABDOMINAL WOUND;  Surgeon: Florene Glen, MD;  Location: [facility];  Service: General;  Laterality: N/A;  . WOUND DEBRIDEMENT N/A 03/09/2015   Procedure: DEBRIDEMENT ABDOMINAL WOUND;  Surgeon: Florene Glen, MD;  Location: [facility];  Service: General;  Laterality: N/A;   Constitutional Exam  General appearance: Well nourished, well developed, and well hydrated. In no apparent acute distress Vitals:   06/06/16 1307 06/06/16 1310  BP: 130/81   Pulse: 72 72  Resp: 20   Temp: 98.1 F (36.7 C)   TempSrc: Oral   SpO2: 98%   Weight: 215 lb (97.5 kg)   Height: '5\' 3"'  (1.6 m)    BMI Assessment: Estimated body mass index is 38.09 kg/m as calculated from the following:   Height as of this encounter: '5\' 3"'  (1.6 m).   Weight as of this encounter: 215 lb (97.5 kg).  BMI interpretation table: BMI level Category Range association with higher incidence of chronic pain  <18 kg/m2  Underweight   18.5-24.9 kg/m2 Ideal body weight   25-29.9 kg/m2 Overweight Increased incidence by 20%  30-34.9 kg/m2 Obese (Class I) Increased incidence by 68%  35-39.9 kg/m2 Severe obesity (Class II) Increased incidence by 136%  >40 kg/m2 Extreme obesity (Class III) Increased incidence by 254%   BMI Readings from Last 4 Encounters:  06/06/16 38.09 kg/m  03/20/16 38.83 kg/m  03/12/16 37.91 kg/m  02/26/16 38.58 kg/m   Wt Readings from Last 4 Encounters:  06/06/16 215 lb (97.5 kg)  03/20/16 219 lb 3.2 oz (99.4 kg)  03/12/16 214 lb (97.1 kg)  02/26/16 217 lb 12.8 oz (98.8 kg)  Psych/Mental status: Alert, oriented x 3 (person, place, & time)       Eyes: PERLA Respiratory: No evidence of acute respiratory distress  Cervical Spine Exam  Inspection: No masses, redness, or swelling Alignment: Symmetrical Functional ROM: Unrestricted ROM Stability: No instability detected Muscle strength & Tone: Functionally intact Sensory: Unimpaired Palpation: No palpable anomalies              Upper Extremity (UE) Exam    Side: Right upper extremity  Side: Left upper extremity  Inspection: No masses, redness, swelling, or asymmetry. No contractures  Inspection: No masses, redness, swelling, or asymmetry. No contractures  Functional ROM: Unrestricted ROM          Functional ROM: Unrestricted ROM          Muscle strength & Tone: Functionally intact  Muscle strength & Tone: Functionally intact  Sensory: Unimpaired  Sensory: Unimpaired  Palpation: No palpable anomalies              Palpation: No palpable anomalies              Specialized Test(s): Deferred         Specialized Test(s): Deferred          Thoracic Spine Exam  Inspection: No masses, redness, or swelling Alignment: Symmetrical Functional ROM: Unrestricted ROM Stability: No instability detected Sensory: Unimpaired Muscle strength & Tone: No palpable anomalies  Lumbar Spine Exam  Inspection: No masses, redness, or  swelling Alignment: Symmetrical Functional ROM: Unrestricted ROM  Stability: No instability detected Muscle strength & Tone: Functionally intact Sensory: Unimpaired Palpation: No palpable anomalies Provocative Tests: Lumbar Hyperextension and rotation test: evaluation deferred today       Patrick's Maneuver: evaluation deferred today              Gait & Posture Assessment  Ambulation: Unassisted Gait: Relatively normal for age and body habitus Posture: WNL   Lower Extremity Exam    Side: Right lower extremity  Side: Left lower extremity  Inspection: No masses, redness, swelling, or asymmetry. No contractures  Inspection: No masses, redness, swelling, or asymmetry. No contractures  Functional ROM: Unrestricted ROM          Functional ROM: Unrestricted ROM          Muscle strength & Tone: Functionally intact  Muscle strength & Tone: Functionally intact  Sensory: Unimpaired  Sensory: Unimpaired  Palpation: No palpable anomalies  Palpation: No palpable anomalies   Assessment  Primary Diagnosis & Pertinent Problem List: The primary encounter diagnosis was Chronic pain syndrome. Diagnoses of Chronic abdominal pain (Location of Primary Source of Pain) (Bilateral) (L>R), Chronic low back pain (Location of Secondary source of pain) (Bilateral) (L>R), Chronic lower extremity pain (Location of Tertiary source of pain) (Bilateral) (R>L), Long term prescription opiate use, and Opiate use (60 MME/Day) were also pertinent to this visit.  Status Diagnosis  Controlled Controlled Controlled 1. Chronic pain syndrome   2. Chronic abdominal pain (Location of Primary Source of Pain) (Bilateral) (L>R)   3. Chronic low back pain (Location of Secondary source of pain) (Bilateral) (L>R)   4. Chronic lower extremity pain (Location of Tertiary source of pain) (Bilateral) (R>L)   5. Long term prescription opiate use   6. Opiate use (60 MME/Day)      Plan of Care  Pharmacotherapy (Medications  Ordered): Meds ordered this encounter  Medications  . oxyCODONE (OXY IR/ROXICODONE) 5 MG immediate release tablet    Sig: Take 1 tablet (5 mg total) by mouth every 8 (eight) hours as needed for severe pain.    Dispense:  90 tablet    Refill:  0    Do not place this medication, or any other prescription from our practice, on "Automatic Refill". Patient may have prescription filled one day early if pharmacy is closed on scheduled refill date. Do not fill until: 06/10/16 To last until: 07/10/16  . oxyCODONE (OXY IR/ROXICODONE) 5 MG immediate release tablet    Sig: Take 1 tablet (5 mg total) by mouth every 8 (eight) hours as needed for severe pain.    Dispense:  90 tablet    Refill:  0    Do not place this medication, or any other prescription from our practice, on "Automatic Refill". Patient may have prescription filled one day early if pharmacy is closed on scheduled refill date. Do not fill until: 07/10/16 To last until: 08/09/16  . oxyCODONE (OXY IR/ROXICODONE) 5 MG immediate release tablet    Sig: Take 1 tablet (5 mg total) by mouth every 8 (eight) hours as needed for severe pain.    Dispense:  90 tablet    Refill:  0    Do not place this medication, or any other prescription from our practice, on "Automatic Refill". Patient may have prescription filled one day early if pharmacy is closed on scheduled refill date. Do not fill until: 08/09/16 To last until: 09/08/16   New Prescriptions   No medications on file   Medications administered today: Ms. Sayer had no  medications administered during this visit. Lab-work, procedure(s), and/or referral(s): No orders of the defined types were placed in this encounter.  Imaging and/or referral(s): None  Interventional therapies: Planned, scheduled, and/or pending:   None at this point, her stomach wound has not healed completely.   Considering:   Diagnostic bilateral lumbar facet block  Possible bilateral lumbar facet rate frequency  ablation.  Diagnostic bilateral celiac plexus block  Diagnostic bilateral intra-articular hip injection  Possible diagnostic bilateral femoral nerve and obturator nerve articular branch blocks  Possible bilateral femoral nerve and obturator nerve articular branch frequency ablation.  Diagnostic bilateral intra-articular knee joint injection  Possible series of 5 bilateral intra-articular Hyalgan knee injections.  Bilateral diagnostic genicular nerve block  Possible bilateral genicular nerve radiofrequency ablation    Palliative PRN treatment(s):   Diagnostic bilateral lumbar facet block  Diagnostic bilateral celiac plexus block  Diagnostic bilateral intra-articular hip injection  Possible diagnostic bilateral femoral nerve and obturator nerve articular branch blocks  Diagnostic bilateral intra-articular knee joint injection Bilateral diagnostic genicular nerve block    Provider-requested follow-up: Return in about 3 months (around 09/05/2016) for (Nurse Practitioner) Med-Mgmt.  Future Appointments Date Time Provider [place]  09/05/2016 1:15 PM [place], NP [facility] None   Primary Care Physician: Adin Hector, MD Location: [facility] Note by: Kathlen Brunswick Dossie Arbour, M.D, DABA, DABAPM, DABPM, DABIPP, FIPP Date: 06/06/2016; Time: 9:47 PM  Patient instructions provided during this appointment: Patient Instructions  Pain Management Discharge Instructions  General Discharge Instructions :  If you need to reach your doctor call: Monday-Friday 8:00 am - 4:00 pm at [phone] or toll free [phone].  After clinic hours [phone] to have operator reach doctor.  Bring all of your medication bottles to all your appointments in the pain clinic.  To cancel or reschedule your appointment with Pain Management please remember to call 24 hours in advance to avoid a fee.  Refer to the educational materials which you have been given on:  General Risks, I had my Procedure. Discharge Instructions, Post Sedation.  Post Procedure Instructions:  The drugs you were given will stay in your system until tomorrow, so for the next 24 hours you should not drive, make any legal decisions or drink any alcoholic beverages.  You may eat anything you prefer, but it is better to start with liquids then soups and crackers, and gradually work up to solid foods.  Please notify your doctor immediately if you have any unusual bleeding, trouble breathing or pain that is not related to your normal pain.  Depending on the type of procedure that was done, some parts of your body may feel week and/or numb.  This usually clears up by tonight or the next day.  Walk with the use of an assistive device or accompanied by an adult for the 24 hours.  You may use ice on the affected area for the first 24 hours.  Put ice in a Ziploc bag and cover with a towel and place against area 15 minutes on 15 minutes off.  You may switch to heat after 24 hours.

## 2016-06-14 DIAGNOSIS — Z86010 Personal history of colonic polyps: Secondary | ICD-10-CM | POA: Insufficient documentation

## 2016-08-08 DIAGNOSIS — E782 Mixed hyperlipidemia: Secondary | ICD-10-CM | POA: Insufficient documentation

## 2016-08-29 ENCOUNTER — Encounter: Payer: Medicare Other | Admitting: Surgery

## 2016-09-04 ENCOUNTER — Ambulatory Visit: Payer: Medicare Other | Admitting: Anesthesiology

## 2016-09-04 ENCOUNTER — Ambulatory Visit
Admission: RE | Admit: 2016-09-04 | Discharge: 2016-09-04 | Disposition: A | Discharge status: Home / Self Care | Payer: Medicare Other | Source: Ambulatory Visit | Attending: Unknown Physician Specialty | Admitting: Unknown Physician Specialty

## 2016-09-04 ENCOUNTER — Encounter
Admission: RE | Disposition: A | Discharge status: Home / Self Care | Payer: Self-pay | Source: Ambulatory Visit | Attending: Unknown Physician Specialty

## 2016-09-04 ENCOUNTER — Encounter: Payer: Self-pay | Admitting: *Deleted

## 2016-09-04 DIAGNOSIS — K6289 Other specified diseases of anus and rectum: Secondary | ICD-10-CM | POA: Insufficient documentation

## 2016-09-04 DIAGNOSIS — K529 Noninfective gastroenteritis and colitis, unspecified: Secondary | ICD-10-CM | POA: Insufficient documentation

## 2016-09-04 DIAGNOSIS — Z888 Allergy status to other drugs, medicaments and biological substances status: Secondary | ICD-10-CM | POA: Insufficient documentation

## 2016-09-04 DIAGNOSIS — E1140 Type 2 diabetes mellitus with diabetic neuropathy, unspecified: Secondary | ICD-10-CM | POA: Diagnosis not present

## 2016-09-04 DIAGNOSIS — Z1211 Encounter for screening for malignant neoplasm of colon: Secondary | ICD-10-CM | POA: Insufficient documentation

## 2016-09-04 DIAGNOSIS — D122 Benign neoplasm of ascending colon: Secondary | ICD-10-CM | POA: Diagnosis not present

## 2016-09-04 DIAGNOSIS — Z8249 Family history of ischemic heart disease and other diseases of the circulatory system: Secondary | ICD-10-CM | POA: Insufficient documentation

## 2016-09-04 DIAGNOSIS — Z9049 Acquired absence of other specified parts of digestive tract: Secondary | ICD-10-CM | POA: Diagnosis not present

## 2016-09-04 DIAGNOSIS — E1122 Type 2 diabetes mellitus with diabetic chronic kidney disease: Secondary | ICD-10-CM | POA: Diagnosis not present

## 2016-09-04 DIAGNOSIS — I129 Hypertensive chronic kidney disease with stage 1 through stage 4 chronic kidney disease, or unspecified chronic kidney disease: Secondary | ICD-10-CM | POA: Insufficient documentation

## 2016-09-04 DIAGNOSIS — D123 Benign neoplasm of transverse colon: Secondary | ICD-10-CM | POA: Diagnosis not present

## 2016-09-04 DIAGNOSIS — E669 Obesity, unspecified: Secondary | ICD-10-CM | POA: Insufficient documentation

## 2016-09-04 DIAGNOSIS — Z87891 Personal history of nicotine dependence: Secondary | ICD-10-CM | POA: Insufficient documentation

## 2016-09-04 DIAGNOSIS — L409 Psoriasis, unspecified: Secondary | ICD-10-CM | POA: Diagnosis not present

## 2016-09-04 DIAGNOSIS — Z6838 Body mass index (BMI) 38.0-38.9, adult: Secondary | ICD-10-CM | POA: Insufficient documentation

## 2016-09-04 DIAGNOSIS — Z794 Long term (current) use of insulin: Secondary | ICD-10-CM | POA: Insufficient documentation

## 2016-09-04 DIAGNOSIS — N189 Chronic kidney disease, unspecified: Secondary | ICD-10-CM | POA: Diagnosis not present

## 2016-09-04 DIAGNOSIS — E039 Hypothyroidism, unspecified: Secondary | ICD-10-CM | POA: Insufficient documentation

## 2016-09-04 DIAGNOSIS — D120 Benign neoplasm of cecum: Secondary | ICD-10-CM | POA: Insufficient documentation

## 2016-09-04 DIAGNOSIS — F419 Anxiety disorder, unspecified: Secondary | ICD-10-CM | POA: Diagnosis not present

## 2016-09-04 DIAGNOSIS — Z79899 Other long term (current) drug therapy: Secondary | ICD-10-CM | POA: Insufficient documentation

## 2016-09-04 DIAGNOSIS — Z800 Family history of malignant neoplasm of digestive organs: Secondary | ICD-10-CM | POA: Insufficient documentation

## 2016-09-04 DIAGNOSIS — E785 Hyperlipidemia, unspecified: Secondary | ICD-10-CM | POA: Insufficient documentation

## 2016-09-04 DIAGNOSIS — D124 Benign neoplasm of descending colon: Secondary | ICD-10-CM | POA: Diagnosis not present

## 2016-09-04 DIAGNOSIS — Z8614 Personal history of Methicillin resistant Staphylococcus aureus infection: Secondary | ICD-10-CM | POA: Insufficient documentation

## 2016-09-04 DIAGNOSIS — Z833 Family history of diabetes mellitus: Secondary | ICD-10-CM | POA: Insufficient documentation

## 2016-09-04 DIAGNOSIS — E538 Deficiency of other specified B group vitamins: Secondary | ICD-10-CM | POA: Insufficient documentation

## 2016-09-04 DIAGNOSIS — Z9889 Other specified postprocedural states: Secondary | ICD-10-CM | POA: Insufficient documentation

## 2016-09-04 DIAGNOSIS — Z86010 Personal history of colonic polyps: Secondary | ICD-10-CM | POA: Insufficient documentation

## 2016-09-04 DIAGNOSIS — Z90710 Acquired absence of both cervix and uterus: Secondary | ICD-10-CM | POA: Insufficient documentation

## 2016-09-04 HISTORY — DX: Deficiency of other specified B group vitamins: E53.8

## 2016-09-04 HISTORY — DX: Unspecified nephritic syndrome with focal and segmental glomerular lesions: N05.1

## 2016-09-04 HISTORY — DX: Localized edema: R60.0

## 2016-09-04 HISTORY — PX: COLONOSCOPY WITH PROPOFOL: SHX5780

## 2016-09-04 HISTORY — DX: Psoriasis, unspecified: L40.9

## 2016-09-04 LAB — GLUCOSE, CAPILLARY: GLUCOSE-CAPILLARY: 129 mg/dL — AB (ref 65–99)

## 2016-09-04 SURGERY — COLONOSCOPY WITH PROPOFOL
Anesthesia: General

## 2016-09-04 MED ORDER — LIDOCAINE HCL (PF) 2 % IJ SOLN
INTRAMUSCULAR | Status: AC
  Filled 2016-09-04: qty 2

## 2016-09-04 MED ORDER — MIDAZOLAM HCL 2 MG/2ML IJ SOLN
INTRAMUSCULAR | Status: DC | PRN
  Administered 2016-09-04: 2 mg via INTRAVENOUS

## 2016-09-04 MED ORDER — PROPOFOL 10 MG/ML IV BOLUS
INTRAVENOUS | Status: DC | PRN
  Administered 2016-09-04: 30 mg via INTRAVENOUS

## 2016-09-04 MED ORDER — SODIUM CHLORIDE 0.9 % IV SOLN
INTRAVENOUS | Status: DC
  Administered 2016-09-04: 07:00:00 via INTRAVENOUS

## 2016-09-04 MED ORDER — PROPOFOL 500 MG/50ML IV EMUL
INTRAVENOUS | Status: DC | PRN
  Administered 2016-09-04: 120 ug/kg/min via INTRAVENOUS

## 2016-09-04 MED ORDER — FENTANYL CITRATE (PF) 100 MCG/2ML IJ SOLN
INTRAMUSCULAR | Status: DC | PRN
  Administered 2016-09-04: 100 ug via INTRAVENOUS

## 2016-09-04 MED ORDER — SODIUM CHLORIDE 0.9 % IV SOLN: INTRAVENOUS | Status: DC

## 2016-09-04 MED ORDER — MIDAZOLAM HCL 2 MG/2ML IJ SOLN
INTRAMUSCULAR | Status: AC
  Filled 2016-09-04: qty 2

## 2016-09-04 MED ORDER — PROPOFOL 500 MG/50ML IV EMUL
INTRAVENOUS | Status: AC
  Filled 2016-09-04: qty 50

## 2016-09-04 MED ORDER — FENTANYL CITRATE (PF) 100 MCG/2ML IJ SOLN
INTRAMUSCULAR | Status: AC
  Filled 2016-09-04: qty 2

## 2016-09-04 NOTE — Anesthesia Post-op Follow-up Note (Signed)
Anesthesia QCDR form completed.        

## 2016-09-04 NOTE — Op Note (Signed)
[facility] Gastroenterology Patient Name: [name] Procedure Date: 09/04/2016 7:35 AM MRN: [id number] Account #: [id number] Date of Birth: 1950-05-02 Admit Type: Outpatient Age: 66 Room: [facility] ENDO ROOM 4 Gender: Female Note Status: Finalized Procedure:            Colonoscopy Indications:          High risk colon cancer surveillance: Personal history                        of colonic polyps Providers:            Manya Silvas, MD Referring MD:         Ramonita Lab, MD (Referring MD) Medicines:            Propofol per Anesthesia Complications:        No immediate complications. Procedure:            Pre-Anesthesia Assessment:                       - After reviewing the risks and benefits, the patient                        was deemed in satisfactory condition to undergo the                        procedure.                       After obtaining informed consent, the colonoscope was                        passed under direct vision. Throughout the procedure,                        the patient's blood pressure, pulse, and oxygen                        saturations were monitored continuously. The                        Colonoscope was introduced through the anus and                        advanced to the the cecum, identified by appendiceal                        orifice and ileocecal valve. The colonoscopy was                        performed without difficulty. The patient tolerated the                        procedure well. The quality of the bowel preparation                        was good. Findings:      A diminutive polyp was found in the cecum. The polyp was sessile. The       polyp was removed with a jumbo cold forceps. Resection and retrieval       were complete.      A small polyp was found in  the ascending colon. The polyp was sessile.       The polyp was removed with a hot snare. Resection and retrieval were       complete.      Two sessile  polyps were found in the transverse colon. The polyps were       diminutive in size. These polyps were removed with a jumbo cold forceps.       Resection and retrieval were complete.      A diminutive polyp was found in the descending colon. The polyp was       sessile. The polyp was removed with a jumbo cold forceps. Resection and       retrieval were complete.      An area of mildly congested mucosa was found in the rectum. This was       biopsied with a cold forceps for histology. More bleeding than usual       from a single biopsy. Impression:           - One diminutive polyp in the cecum, removed with a                        jumbo cold forceps. Resected and retrieved.                       - One small polyp in the ascending colon, removed with                        a hot snare. Resected and retrieved.                       - Two diminutive polyps in the transverse colon,                        removed with a jumbo cold forceps. Resected and                        retrieved.                       - One diminutive polyp in the descending colon, removed                        with a jumbo cold forceps. Resected and retrieved.                       - Congested mucosa in the rectum. Biopsied. Recommendation:       - Await pathology results. Manya Silvas, MD 09/04/2016 8:04:44 AM This report has been signed electronically. Number of Addenda: 0 Note Initiated On: 09/04/2016 7:35 AM Scope Withdrawal Time: 0 hours 13 minutes 33 seconds  Total Procedure Duration: 0 hours 18 minutes 37 seconds       [facility]

## 2016-09-04 NOTE — H&P (Signed)
Primary Care Physician:  Adin Hector, MD Primary Gastroenterologist:  Dr. Vira Agar  Pre-Procedure History & Physical: HPI:  [name] is a 66 y.o. female is here for an colonoscopy.   Past Medical History:  Diagnosis Date  . Anemia   . Anxiety   . Aortic valve stenosis   . Arthritis    feet, legs  . B12 deficiency   . Bowel obstruction ([place])   . CKD (chronic kidney disease)    protein in urine  . Colostomy in place [facility])   . Diabetes mellitus without complication ([place])   . Diverticulitis large intestine   . Dysrhythmia   . 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 FSGS (focal segmental glomerulosclerosis)   . Gastritis   . GI bleed   . Heart murmur   . Hyperlipidemia   . Hypertension   . Hypothyroidism   . Hypothyroidism    unspecified  . MRSA (methicillin resistant Staphylococcus aureus)    at abdominal wound.  Jan 2017.  Treated.   . Neuropathy   . Neuropathy in diabetes ([place])   . Obesity   . Pedal edema   . Psoriasis   . Shortness of breath dyspnea   . Vertigo     Past Surgical History:  Procedure Laterality Date  . ABDOMINAL HYSTERECTOMY    . APPLICATION OF WOUND VAC N/A 02/24/2015   Procedure: APPLICATION OF WOUND VAC;  Surgeon: Clayburn Pert, MD;  Location: [facility] ORS;  Service: General;  Laterality: N/A;  . APPLICATION OF WOUND VAC N/A 02/26/2015   Procedure: APPLICATION OF WOUND VAC;  Surgeon: Clayburn Pert, MD;  Location: [facility] ORS;  Service: General;  Laterality: N/A;  . BACK SURGERY     spur frmoved from lower back  . CARDIAC CATHETERIZATION    . COLECTOMY    . COLOSTOMY REVERSAL N/A 07/12/2015   Procedure: COLOSTOMY REVERSAL;  Surgeon: Clayburn Pert, MD;  Location: [facility] ORS;  Service: General;  Laterality: N/A;  . DEBRIDEMENT OF ABDOMINAL WALL ABSCESS N/A 02/22/2015   Procedure: DEBRIDEMENT OF ABDOMINAL WALL ABSCESS;  Surgeon: Clayburn Pert, MD;  Location: [facility] ORS;  Service: General;  Laterality: N/A;  . EXCISION MASS ABDOMINAL N/A 02/24/2015    Procedure: EXCISION MASS ABDOMINAL  / [place];  Surgeon: Clayburn Pert, MD;  Location: [facility] ORS;  Service: General;  Laterality: N/A;  . EXCISION MASS ABDOMINAL N/A 02/26/2015   Procedure: EXCISION MASS ABDOMINAL/wash out;  Surgeon: Clayburn Pert, MD;  Location: [facility] ORS;  Service: General;  Laterality: N/A;  . FLEXIBLE SIGMOIDOSCOPY  06/19/2015   Procedure: FLEXIBLE SIGMOIDOSCOPY;  Surgeon: Lucilla Lame, MD;  Location: [place];  Service: Endoscopy;;  UNABLE TO ACCESS OSTOMY SITE FOR ACCESS INTO COLON  . LAPAROSCOPY  07/12/2015   Procedure: LAPAROSCOPY DIAGNOSTIC;  Surgeon: Clayburn Pert, MD;  Location: [facility] ORS;  Service: General;;  . LAPAROTOMY  07/12/2015   Procedure: EXPLORATORY LAPAROTOMY;  Surgeon: Clayburn Pert, MD;  Location: [facility] ORS;  Service: General;;  . LAPAROTOMY N/A 02/06/2015   Procedure: Laparotomy, reduction of incarcerated parastomal hernia, repair of parastomal hernia with mesh;  Surgeon: Sherri Rad, MD;  Location: [facility] ORS;  Service: General;  Laterality: N/A;  . laparotomy closure of cecal perforation  05/09/2013   Dr. Marina Gravel  . LYSIS OF ADHESION  07/12/2015   Procedure: LYSIS OF ADHESION;  Surgeon: Clayburn Pert, MD;  Location: [facility] ORS;  Service: General;;  . TONSILLECTOMY    . WOUND DEBRIDEMENT N/A 03/03/2015   Procedure: DEBRIDEMENT  ABDOMINAL WOUND;  Surgeon: Florene Glen, MD;  Location: [facility] ORS;  Service: General;  Laterality: N/A;  . WOUND DEBRIDEMENT N/A 03/09/2015   Procedure: DEBRIDEMENT ABDOMINAL WOUND;  Surgeon: Florene Glen, MD;  Location: [facility] ORS;  Service: General;  Laterality: N/A;    Prior to Admission medications   Medication Sig Start Date End Date Taking? Authorizing Provider  amLODipine (NORVASC) 5 MG tablet Take 1 tablet (5 mg total) by mouth daily. 07/22/15  Yes Florene Glen, MD  atorvastatin (LIPITOR) 40 MG tablet Take 40 mg by mouth daily at 6 PM.  04/19/15  Yes [provider]  DULoxetine (CYMBALTA) 30 MG  capsule Take 30 mg by mouth daily.  01/18/15  Yes [provider]  famotidine (PEPCID) 20 MG tablet Take 1 tablet (20 mg total) by mouth 2 (two) times daily. 12/18/15  Yes Clayburn Pert, MD  glimepiride (AMARYL) 4 MG tablet Take 2 mg by mouth at bedtime. 1 tab AM, 1/2 tab PM 08/12/14  Yes [provider]  insulin glargine (LANTUS) 100 UNIT/ML injection Inject 14 Units into the skin at bedtime.    Yes [provider]  levothyroxine (SYNTHROID, LEVOTHROID) 88 MCG tablet TAKE 1 TABLET BY MOUTH ONCE A DAY FOR THYROID 09/11/15  Yes [provider]  metoprolol succinate (TOPROL-XL) 25 MG 24 hr tablet TAKE 1 TABLET BY MOUTH ONCE A DAY 07/24/15  Yes [provider]  oxyCODONE (OXY IR/ROXICODONE) 5 MG immediate release tablet Take 1 tablet (5 mg total) by mouth every 8 (eight) hours as needed for severe pain. 08/09/16 09/08/16 Yes Milinda Pointer, MD  telmisartan (MICARDIS) 80 MG tablet TAKE 1 TABLET BY MOUTH ONCE A DAY 03/06/15  Yes [provider]  Vitamin D, Ergocalciferol, (DRISDOL) 50000 units CAPS capsule Take 50,000 Units by mouth every 7 (seven) days.    Yes [provider]  calcitRIOL (ROCALTROL) 0.25 MCG capsule Take 1 capsule by mouth every other day. 12/22/15   [provider]  clobetasol cream (TEMOVATE) 1.51 % Apply 1 application topically daily. 12/29/15   [provider]  Ergocalciferol (VITAMIN D2) 2000 units TABS Take 1.25 mg by mouth once a week.     [provider]  glucose blood (ONE TOUCH ULTRA TEST) test strip TEST TWICE DAILY 09/06/14   [provider]  nystatin (MYCOSTATIN/NYSTOP) powder Apply topically 4 (four) times daily. 02/26/16   Clayburn Pert, MD  [facility] DELICA LANCETS [address] [place] TEST TWICE DAILY 09/06/14   [provider]  oxyCODONE (OXY IR/ROXICODONE) 5 MG immediate release tablet Take 1 tablet (5 mg total) by mouth every 8 (eight) hours as needed for severe pain. 06/10/16  07/10/16  Milinda Pointer, MD  oxyCODONE (OXY IR/ROXICODONE) 5 MG immediate release tablet Take 1 tablet (5 mg total) by mouth every 8 (eight) hours as needed for severe pain. 07/10/16 08/09/16  Milinda Pointer, MD    Allergies as of 06/17/2016 - Review Complete 06/06/2016  Allergen Reaction Noted  . Buspirone Other (See Comments) 02/16/2015  . Citalopram Other (See Comments) 02/16/2015  . Lisinopril Cough 02/16/2015  . Metformin Diarrhea 02/16/2015  . Pravastatin Other (See Comments) 02/16/2015  . Sitagliptin Other (See Comments) 02/16/2015  . Tramadol Itching 02/16/2015  . Diltiazem hcl Palpitations 02/16/2015  . Gabapentin Palpitations 02/16/2015  . Hydralazine Rash 06/05/2015  . Lovastatin Palpitations 02/16/2015    Family History  Problem Relation Age of Onset  . Diabetes Mother   . Hypertension Father   . CAD Father   .  Heart attack Father   . Colon cancer Brother     Social History   Social History  . Marital status: Divorced    Spouse name: N/A  . Number of children: N/A  . Years of education: N/A   Occupational History  . Not on file.   Social History Main Topics  . Smoking status: Former Smoker    Types: Cigarettes    Quit date: 04/18/2013  . Smokeless tobacco: Never Used  . Alcohol use No  . Drug use: No  . Sexual activity: Not on file   Other Topics Concern  . Not on file   Social History Narrative  . No narrative on file    Review of Systems: See HPI, otherwise negative ROS  Physical Exam: BP 133/75   Pulse 75   Temp (!) 97.1 F (36.2 C) (Tympanic)   Resp 18   Ht 5\' 3"  (1.6 m)   Wt 99.8 kg (220 lb)   SpO2 96%   BMI 38.97 kg/m  General:   Alert,  pleasant and cooperative in NAD Head:  Normocephalic and atraumatic. Neck:  Supple; no masses or thyromegaly. Lungs:  Clear throughout to auscultation.    Heart:  Regular rate and rhythm. Signif heart murmur from aortic stenosis Abdomen:  Soft, nontender and nondistended. Normal bowel  sounds, without guarding, and without rebound.  Obese. Neurologic:  Alert and  oriented x4;  grossly normal neurologically.  Impression/Plan: [name] is here for an colonoscopy to be performed for [facility] colon polyps.  Risks, benefits, limitations, and alternatives regarding  colonoscopy have been reviewed with the patient.  Questions have been answered.  All parties agreeable.   Gaylyn Cheers, MD  09/04/2016, 7:31 AM

## 2016-09-04 NOTE — Anesthesia Postprocedure Evaluation (Signed)
Anesthesia Post Note  Patient: [name]  Procedure(s) Performed: Procedure(s) (LRB): COLONOSCOPY WITH PROPOFOL (N/A)  Patient location during evaluation: PACU Anesthesia Type: General Level of consciousness: awake Pain management: pain level controlled Vital Signs Assessment: post-procedure vital signs reviewed and stable Respiratory status: spontaneous breathing Cardiovascular status: stable Anesthetic complications: no     Last Vitals:  Vitals:   09/04/16 0804 09/04/16 0834  BP: (!) 98/55 (!) 107/58  Pulse: 76   Resp: 15   Temp: (!) 35.7 C     Last Pain:  Vitals:   09/04/16 0804  TempSrc: Tympanic  PainSc: 0-No pain                 VAN STAVEREN,Cody Oliger

## 2016-09-04 NOTE — Transfer of Care (Signed)
Immediate Anesthesia Transfer of Care Note  Patient: [name]  Procedure(s) Performed: Procedure(s): COLONOSCOPY WITH PROPOFOL (N/A)  Patient Location: PACU  Anesthesia Type:General  Level of Consciousness: awake and alert   Airway & Oxygen Therapy: Patient Spontanous Breathing and Patient connected to nasal cannula oxygen  Post-op Assessment: Report given to RN and Post -op Vital signs reviewed and stable  Post vital signs: Reviewed  Last Vitals:  Vitals:   09/04/16 0654 09/04/16 0804  BP: 133/75   Pulse: 75   Resp: 18   Temp: (!) 36.2 C (!) 35.7 C    Last Pain:  Vitals:   09/04/16 0804  TempSrc: Tympanic         Complications: No apparent anesthesia complications

## 2016-09-04 NOTE — Anesthesia Preprocedure Evaluation (Signed)
Anesthesia Evaluation  Patient identified by MRN, date of birth, ID band Patient awake    Reviewed: Allergy & Precautions  Airway Mallampati: III       Dental  (+) Teeth Intact   Pulmonary sleep apnea , COPD, former smoker,     + decreased breath sounds      Cardiovascular hypertension, Pt. on medications and Pt. on home beta blockers + dysrhythmias  Rhythm:Regular     Neuro/Psych Anxiety Depression    GI/Hepatic negative GI ROS, Neg liver ROS,   Endo/Other  diabetes, Type 1, Insulin Dependent, Oral Hypoglycemic AgentsHypothyroidism   Renal/GU      Musculoskeletal   Abdominal (+) + obese,   Peds  Hematology  (+) anemia ,   Anesthesia Other Findings   Reproductive/Obstetrics                             Anesthesia Physical Anesthesia Plan  ASA: III  Anesthesia Plan: General   Post-op Pain Management:    Induction: Intravenous  PONV Risk Score and Plan: 0  Airway Management Planned: Natural Airway and Nasal Cannula  Additional Equipment:   Intra-op Plan:   Post-operative Plan:   Informed Consent: I have reviewed the patients History and Physical, chart, labs and discussed the procedure including the risks, benefits and alternatives for the proposed anesthesia with the patient or authorized representative who has indicated his/her understanding and acceptance.     Plan Discussed with: Surgeon  Anesthesia Plan Comments:         Anesthesia Quick Evaluation

## 2016-09-05 ENCOUNTER — Encounter: Payer: Self-pay | Admitting: Unknown Physician Specialty

## 2016-09-05 ENCOUNTER — Ambulatory Visit: Payer: Medicare Other | Attending: Nurse Practitioner | Admitting: Nurse Practitioner

## 2016-09-05 VITALS — BP 157/79 | HR 72 | Temp 98.2°F | Resp 16 | Ht 63.0 in | Wt 220.0 lb

## 2016-09-05 DIAGNOSIS — Z6839 Body mass index (BMI) 39.0-39.9, adult: Secondary | ICD-10-CM | POA: Diagnosis not present

## 2016-09-05 DIAGNOSIS — I350 Nonrheumatic aortic (valve) stenosis: Secondary | ICD-10-CM | POA: Insufficient documentation

## 2016-09-05 DIAGNOSIS — G8929 Other chronic pain: Secondary | ICD-10-CM | POA: Diagnosis not present

## 2016-09-05 DIAGNOSIS — E1122 Type 2 diabetes mellitus with diabetic chronic kidney disease: Secondary | ICD-10-CM | POA: Insufficient documentation

## 2016-09-05 DIAGNOSIS — M25551 Pain in right hip: Secondary | ICD-10-CM | POA: Insufficient documentation

## 2016-09-05 DIAGNOSIS — E034 Atrophy of thyroid (acquired): Secondary | ICD-10-CM | POA: Insufficient documentation

## 2016-09-05 DIAGNOSIS — R109 Unspecified abdominal pain: Secondary | ICD-10-CM | POA: Insufficient documentation

## 2016-09-05 DIAGNOSIS — M25562 Pain in left knee: Secondary | ICD-10-CM | POA: Diagnosis not present

## 2016-09-05 DIAGNOSIS — E785 Hyperlipidemia, unspecified: Secondary | ICD-10-CM | POA: Diagnosis not present

## 2016-09-05 DIAGNOSIS — N183 Chronic kidney disease, stage 3 (moderate): Secondary | ICD-10-CM | POA: Insufficient documentation

## 2016-09-05 DIAGNOSIS — I471 Supraventricular tachycardia: Secondary | ICD-10-CM | POA: Insufficient documentation

## 2016-09-05 DIAGNOSIS — Z933 Colostomy status: Secondary | ICD-10-CM | POA: Insufficient documentation

## 2016-09-05 DIAGNOSIS — Z86010 Personal history of colonic polyps: Secondary | ICD-10-CM | POA: Insufficient documentation

## 2016-09-05 DIAGNOSIS — Z79891 Long term (current) use of opiate analgesic: Secondary | ICD-10-CM

## 2016-09-05 DIAGNOSIS — K56609 Unspecified intestinal obstruction, unspecified as to partial versus complete obstruction: Secondary | ICD-10-CM | POA: Diagnosis not present

## 2016-09-05 DIAGNOSIS — I129 Hypertensive chronic kidney disease with stage 1 through stage 4 chronic kidney disease, or unspecified chronic kidney disease: Secondary | ICD-10-CM | POA: Diagnosis not present

## 2016-09-05 DIAGNOSIS — E538 Deficiency of other specified B group vitamins: Secondary | ICD-10-CM | POA: Insufficient documentation

## 2016-09-05 DIAGNOSIS — D649 Anemia, unspecified: Secondary | ICD-10-CM | POA: Diagnosis not present

## 2016-09-05 DIAGNOSIS — M5442 Lumbago with sciatica, left side: Secondary | ICD-10-CM | POA: Diagnosis not present

## 2016-09-05 DIAGNOSIS — G894 Chronic pain syndrome: Secondary | ICD-10-CM | POA: Diagnosis not present

## 2016-09-05 DIAGNOSIS — M545 Low back pain: Secondary | ICD-10-CM | POA: Insufficient documentation

## 2016-09-05 DIAGNOSIS — E1142 Type 2 diabetes mellitus with diabetic polyneuropathy: Secondary | ICD-10-CM | POA: Insufficient documentation

## 2016-09-05 DIAGNOSIS — M8580 Other specified disorders of bone density and structure, unspecified site: Secondary | ICD-10-CM | POA: Diagnosis not present

## 2016-09-05 DIAGNOSIS — Z794 Long term (current) use of insulin: Secondary | ICD-10-CM | POA: Insufficient documentation

## 2016-09-05 DIAGNOSIS — M542 Cervicalgia: Secondary | ICD-10-CM | POA: Insufficient documentation

## 2016-09-05 DIAGNOSIS — M47816 Spondylosis without myelopathy or radiculopathy, lumbar region: Secondary | ICD-10-CM | POA: Diagnosis not present

## 2016-09-05 DIAGNOSIS — E669 Obesity, unspecified: Secondary | ICD-10-CM | POA: Insufficient documentation

## 2016-09-05 DIAGNOSIS — M25552 Pain in left hip: Secondary | ICD-10-CM | POA: Diagnosis not present

## 2016-09-05 DIAGNOSIS — M5441 Lumbago with sciatica, right side: Secondary | ICD-10-CM

## 2016-09-05 DIAGNOSIS — M25561 Pain in right knee: Secondary | ICD-10-CM | POA: Insufficient documentation

## 2016-09-05 DIAGNOSIS — F419 Anxiety disorder, unspecified: Secondary | ICD-10-CM | POA: Insufficient documentation

## 2016-09-05 DIAGNOSIS — K5732 Diverticulitis of large intestine without perforation or abscess without bleeding: Secondary | ICD-10-CM | POA: Insufficient documentation

## 2016-09-05 DIAGNOSIS — Z87891 Personal history of nicotine dependence: Secondary | ICD-10-CM | POA: Insufficient documentation

## 2016-09-05 LAB — SURGICAL PATHOLOGY

## 2016-09-05 MED ORDER — OXYCODONE HCL 5 MG PO TABS: 5.0000 mg | ORAL_TABLET | Freq: Three times a day (TID) | ORAL | 0 refills | Status: DC | PRN

## 2016-09-05 NOTE — Patient Instructions (Addendum)
____________________________________________________________________________________________  Medication Rules  Applies to: All patients receiving prescriptions (written or electronic).  Pharmacy of record: Pharmacy where electronic prescriptions will be sent. If written prescriptions are taken to a different pharmacy, please inform the nursing staff. The pharmacy listed in the electronic medical record should be the one where you would like electronic prescriptions to be sent.  Prescription refills: Only during scheduled appointments. Applies to both, written and electronic prescriptions.  NOTE: The following applies primarily to controlled substances (Opioid* Pain Medications).   Patient's responsibilities: 1. Pain Pills: Bring all pain pills to every appointment (except for procedure appointments). 2. Pill Bottles: Bring pills in original pharmacy bottle. Always bring newest bottle. Bring bottle, even if empty. 3. Medication refills: You are responsible for knowing and keeping track of what medications you need refilled. The day before your appointment, write a list of all prescriptions that need to be refilled. Bring that list to your appointment and give it to the admitting nurse. Prescriptions will be written only during appointments. If you forget a medication, it will not be "Called in", "Faxed", or "electronically sent". You will need to get another appointment to get these prescribed. 4. Prescription Accuracy: You are responsible for carefully inspecting your prescriptions before leaving our office. Have the discharge nurse carefully go over each prescription with you, before taking them home. Make sure that your name is accurately spelled, that your address is correct. Check the name and dose of your medication to make sure it is accurate. Check the number of pills, and the written instructions to make sure they are clear and accurate. Make sure that you are given enough medication to  last until your next medication refill appointment. 5. Taking Medication: Take medication as prescribed. Never take more pills than instructed. Never take medication more frequently than prescribed. Taking less pills or less frequently is permitted and encouraged, when it comes to controlled substances (written prescriptions).  6. Inform other Doctors: Always inform, all of your healthcare providers, of all the medications you take. 7. Pain Medication from other Providers: You are not allowed to accept any additional pain medication from any other Doctor or Healthcare provider. There are two exceptions to this rule. (see below) In the event that you require additional pain medication, you are responsible for notifying us, as stated below. 8. Medication Agreement: You are responsible for carefully reading and following our Medication Agreement. This must be signed before receiving any prescriptions from our practice. Safely store a copy of your signed Agreement. Violations to the Agreement will result in no further prescriptions. (Additional copies of our Medication Agreement are available upon request.) 9. Laws, Rules, & Regulations: All patients are expected to follow all Federal and State Laws, Statutes, Rules, & Regulations. Ignorance of the Laws does not constitute a valid excuse. The use of any illegal substances is prohibited. 10. Adopted CDC guidelines & recommendations: Target dosing levels will be at or below 60 MME/day. Use of benzodiazepines** is not recommended.  Exceptions: There are only two exceptions to the rule of not receiving pain medications from other Healthcare Providers. 1. Exception #1 (Emergencies): In the event of an emergency (i.e.: accident requiring emergency care), you are allowed to receive additional pain medication. However, you are responsible for: As soon as you are able, call our office [phone], at any time of the day or night, and leave a message stating your  name, the date and nature of the emergency, and the name and dose of the medication   prescribed. In the event that your call is answered by a member of our staff, make sure to document and save the date, time, and the name of the person that took your information.  2. Exception #2 (Planned Surgery): In the event that you are scheduled by another doctor or dentist to have any type of surgery or procedure, you are allowed (for a period no longer than 30 days), to receive additional pain medication, for the acute post-op pain. However, in this case, you are responsible for picking up a copy of our "Post-op Pain Management for Surgeons" handout, and giving it to your surgeon or dentist. This document is available at our office, and does not require an appointment to obtain it. Simply go to our office during business hours (Monday-Thursday from 8:00 AM to 4:00 PM) (Friday 8:00 AM to 12:00 Noon) or if you have a scheduled appointment with [place], prior to your surgery, and ask for it by name. In addition, you will need to provide [place] with your name, name of your surgeon, type of surgery, and date of procedure or surgery.  *Opioid medications include: morphine, codeine, oxycodone, oxymorphone, hydrocodone, hydromorphone, meperidine, tramadol, tapentadol, buprenorphine, fentanyl, methadone. **Benzodiazepine medications include: diazepam (Valium), alprazolam (Xanax), clonazepam (Klonopine), lorazepam (Ativan), clorazepate (Tranxene), chlordiazepoxide (Librium), estazolam (Prosom), oxazepam (Serax), temazepam (Restoril), triazolam (Halcion)  ____________________________________________________________________________________________ ____________________________________________________________________________________________  Pain Scale  Introduction: The pain score used by this practice is the Verbal Numerical Rating Scale (VNRS-11). This is an 11-point scale. It is for adults and children 10 years or older. There are  significant differences in how the pain score is reported, used, and applied. Forget everything you learned in the past and learn this scoring system.  General Information: The scale should reflect your current level of pain. Unless you are specifically asked for the level of your worst pain, or your average pain. If you are asked for one of these two, then it should be understood that it is over the past 24 hours.  Basic Activities of Daily Living (ADL): Personal hygiene, dressing, eating, transferring, and using restroom.  Instructions: Most patients tend to report their level of pain as a combination of two factors, their physical pain and their psychosocial pain. This last one is also known as "suffering" and it is reflection of how physical pain affects you socially and psychologically. From now on, report them separately. From this point on, when asked to report your pain level, report only your physical pain. Use the following table for reference.  Pain Clinic Pain Levels (0-5/10)  Pain Level Score  Description  No Pain 0   Mild pain 1 Nagging, annoying, but does not interfere with basic activities of daily living (ADL). Patients are able to eat, bathe, get dressed, toileting (being able to get on and off the toilet and perform personal hygiene functions), transfer (move in and out of bed or a chair without assistance), and maintain continence (able to control bladder and bowel functions). Blood pressure and heart rate are unaffected. A normal heart rate for a healthy adult ranges from 60 to 100 bpm (beats per minute).   Mild to moderate pain 2 Noticeable and distracting. Impossible to hide from other people. More frequent flare-ups. Still possible to adapt and function close to normal. It can be very annoying and may have occasional stronger flare-ups. With discipline, patients may get used to it and adapt.   Moderate pain 3 Interferes significantly with activities of daily living (ADL). It  becomes difficult to  feed, bathe, get dressed, get on and off the toilet or to perform personal hygiene functions. Difficult to get in and out of bed or a chair without assistance. Very distracting. With effort, it can be ignored when deeply involved in activities.   Moderately severe pain 4 Impossible to ignore for more than a few minutes. With effort, patients may still be able to manage work or participate in some social activities. Very difficult to concentrate. Signs of autonomic nervous system discharge are evident: dilated pupils (mydriasis); mild sweating (diaphoresis); sleep interference. Heart rate becomes elevated (>115 bpm). Diastolic blood pressure (lower number) rises above 100 mmHg. Patients find relief in laying down and not moving.   Severe pain 5 Intense and extremely unpleasant. Associated with frowning face and frequent crying. Pain overwhelms the senses.  Ability to do any activity or maintain social relationships becomes significantly limited. Conversation becomes difficult. Pacing back and forth is common, as getting into a comfortable position is nearly impossible. Pain wakes you up from deep sleep. Physical signs will be obvious: pupillary dilation; increased sweating; goosebumps; brisk reflexes; cold, clammy hands and feet; nausea, vomiting or dry heaves; loss of appetite; significant sleep disturbance with inability to fall asleep or to remain asleep. When persistent, significant weight loss is observed due to the complete loss of appetite and sleep deprivation.  Blood pressure and heart rate becomes significantly elevated. Caution: If elevated blood pressure triggers a pounding headache associated with blurred vision, then the patient should immediately seek attention at an urgent or emergency care unit, as these may be signs of an impending stroke.    Emergency Department Pain Levels (6-10/10)  Emergency Room Pain 6 Severely limiting. Requires emergency care and should not be  seen or managed at an outpatient pain management facility. Communication becomes difficult and requires great effort. Assistance to reach the emergency department may be required. Facial flushing and profuse sweating along with potentially dangerous increases in heart rate and blood pressure will be evident.   Distressing pain 7 Self-care is very difficult. Assistance is required to transport, or use restroom. Assistance to reach the emergency department will be required. Tasks requiring coordination, such as bathing and getting dressed become very difficult.   Disabling pain 8 Self-care is no longer possible. At this level, pain is disabling. The individual is unable to do even the most "basic" activities such as walking, eating, bathing, dressing, transferring to a bed, or toileting. Fine motor skills are lost. It is difficult to think clearly.   Incapacitating pain 9 Pain becomes incapacitating. Thought processing is no longer possible. Difficult to remember your own name. Control of movement and coordination are lost.   The worst pain imaginable 10 At this level, most patients pass out from pain. When this level is reached, collapse of the autonomic nervous system occurs, leading to a sudden drop in blood pressure and heart rate. This in turn results in a temporary and dramatic drop in blood flow to the brain, leading to a loss of consciousness. Fainting is one of the body's self defense mechanisms. Passing out puts the brain in a calmed state and causes it to shut down for a while, in order to begin the healing process.    Summary: 1. Refer to this scale when providing [place] with your pain level. 2. Be accurate and careful when reporting your pain level. This will help with your care. 3. Over-reporting your pain level will lead to loss of credibility. 4. Even a level of 1/10  means that there is pain and will be treated at our facility. 5. High, inaccurate reporting will be documented as "Symptom  Exaggeration", leading to loss of credibility and suspicions of possible secondary gains such as obtaining more narcotics, or wanting to appear disabled, for fraudulent reasons. 6. Only pain levels of 5 or below will be seen at our facility. 7. Pain levels of 6 and above will be sent to the Emergency Department and the appointment cancelled.  You were given 3 prescriptions for Oxycodone today. ____________________________________________________________________________________________

## 2016-09-05 NOTE — Progress Notes (Signed)
[name]'s Name: [name]  MRN: [id number]  Referring Provider: Adin Hector, MD  DOB: 1950-03-06  PCP: Adin Hector, MD  DOS: 09/05/2016  Note by: Vevelyn Francois NP  Service setting: Ambulatory outpatient  Specialty: Interventional Pain Management  Location: [facility] (AMB) Pain Management Facility    [name] type: Established    Primary Reason(s) for Visit: Encounter for prescription drug management. (Level of risk: moderate)  CC: Back Pain (lower)  HPI  [name] is a 66 y.o. year old, female [name], who comes today for a medication management evaluation. She has Obesity; Small bowel obstruction ([place]); Absolute anemia; Aortic heart valve narrowing; Arthritis; B12 deficiency; Benign essential HTN; Chronic kidney disease (CKD), stage III (moderate); Focal and segmental hyalinosis; Bleeding gastrointestinal; Acquired atrophy of thyroid; Abnormal presence of protein in urine; Psoriasis; Paroxysmal supraventricular tachycardia (HCC); SOBOE (shortness of breath on exertion); Type 2 diabetes mellitus ([place]); Chronic abdominal pain (Location of Primary Source of Pain) (Bilateral) (L>R); Long term current use of opiate analgesic; Long term prescription opiate use; Opiate use (22.5 MME/Day); Encounter for therapeutic drug level monitoring; Encounter for pain management planning; Neurogenic pain; Musculoskeletal pain; Visceral abdominal pain; Diabetic peripheral neuropathy ([place]); Chronic low back pain (Location of Secondary source of pain) (Bilateral) (L>R); Chronic lower extremity pain (Location of Tertiary source of pain) (Bilateral) (R>L); Chronic hip pain (Bilateral) (L>R); Chronic knee pain (Bilateral) (L>R); Chronic neck pain (Bilateral) (L>R); H/O colostomy; Diabetic neuropathy ([place]); Recurrent major depressive disorder, in full remission ([place]); Lumbar spondylosis; Lumbar facet syndrome (Bilateral) (L>R); Polyneuropathy; Heart burn; Osteopenia of multiple sites; FSGS (focal segmental  glomerulosclerosis); Chronic pain syndrome; Obesity, Class II, BMI 35-39.9; Hx of adenomatous colonic polyps; and Hyperlipidemia, mixed on her problem list. Her primarily concern today is Alicia Back Pain (lower)  Pain Assessment: Location: Lower Back Radiating: left upper leg Onset: More than a month ago Duration: Chronic pain Quality: Throbbing, Aching Severity: 6 /10 (self-reported pain score)  Note: Reported level is compatible with observation. Clinically [name] looks like a 2/10       Effect on ADL:   Timing: Intermittent Modifying factors: medication  [name] was last scheduled for an appointment on 06/06/16 for medication management. During today's appointment we reviewed [name]'s chronic pain status, as well as her outpatient medication regimen. She has radicular symptoms that radiates down Alicia left leg to her knee. She admits that her hip is also painful. She is also having bilateral knee pain. She has some numbness and tingling with weakness. She admits that her back pain is worse with standing. She admtis that she does have a little itching with Alicia Oxycodone. She has not been able to have any procedures secondary to her previous abdominal surgery. She states that this does continue to heal area   [name]  reports that she does not use drugs. Her body mass index is 38.97 kg/m.  Further details on both, my assessment(s), as well as Alicia proposed treatment plan, please see below.  Controlled Substance Pharmacotherapy Assessment REMS (Risk Evaluation and Mitigation Strategy)  Analgesic:Oxycodone IR 22m every 8hours (139mday) MME/day:22.11m81may   WheLandis MartinsN  09/05/2016  1:14 PM  Sign at close encounter Nursing Pain Medication Assessment:  Safety precautions to be maintained throughout Alicia outpatient stay will include: orient to surroundings, keep bed in low position, maintain call bell within reach at all times, provide assistance with transfer out of bed  and ambulation.  Medication Inspection Compliance: Pill count conducted  under aseptic conditions, in front of [name]. Neither Alicia pills nor Alicia bottle was removed from [name]'s sight at any time. Once count was completed pills were immediately returned to [name] in their original bottle.  Medication: Oxycodone IR Pill/Patch Count: 62 of 90 pills remain Pill/Patch Appearance: Markings consistent with prescribed medication Bottle Appearance: Standard pharmacy container. Clearly labeled. Filled Date:06/22/ 2018 Last Medication intake:  Today   Pharmacokinetics: Liberation and absorption (onset of action): WNL Distribution (time to peak effect): WNL Metabolism and excretion (duration of action): WNL         Pharmacodynamics: Desired effects: Analgesia: [name] reports >50% benefit. Functional ability: [name] reports that medication allows her to accomplish basic ADLs Clinically meaningful improvement in function (CMIF): Sustained CMIF goals met Perceived effectiveness: Described as relatively effective, allowing for increase in activities of daily living (ADL) Undesirable effects: Side-effects or Adverse reactions: None reported Monitoring: [place] PMP: Online review of Alicia past 22-monthperiod conducted. Compliant with practice rules and regulations List of all UDS test(s) done:  Lab Results  Component Value Date   TOXASSSELUR FINAL 11/07/2015   SUMMARY FINAL 07/03/2015   Last UDS on record: ToxAssure Select 13  Date Value Ref Range Status  11/07/2015 FINAL  Final    Comment:    ==================================================================== TOXASSURE SELECT 13 (MW) ==================================================================== Test                             Result       Flag       Units Drug Present and Declared for Prescription Verification   Oxycodone                      241          EXPECTED   ng/mg creat   Oxymorphone                    288           EXPECTED   ng/mg creat   Noroxycodone                   191          EXPECTED   ng/mg creat   Noroxymorphone                 89           EXPECTED   ng/mg creat    Sources of oxycodone are scheduled prescription medications.    Oxymorphone, noroxycodone, and noroxymorphone are expected    metabolites of oxycodone. Oxymorphone is also available as a    scheduled prescription medication. ==================================================================== Test                      Result    Flag   Units      Ref Range   Creatinine              56               mg/dL      >=20 ==================================================================== Declared Medications:  Alicia flagging and interpretation on this report are based on Alicia  following declared medications.  Unexpected results may arise from  inaccuracies in Alicia declared medications.  **Note: Alicia testing scope of this panel includes these medications:  Oxycodone  **Note: Alicia testing scope of this panel does not include following  reported medications:  Amiodarone (Pacerone)  Amlodipine (Norvasc)  Atorvastatin (Lipitor)  Bisacodyl  Calcium Carbonate (Tums)  Clobetasol (Temovate)  Duloxetine (Cymbalta)  Gabapentin (Neurontin)  Glimepiride (Amaryl)  Insulin (Lantus)  Levothyroxine (Levothroid)  Levothyroxine (Synthroid)  Metoprolol (Toprol)  Telmisartan (Micardis)  Vitamin D2 (Drisdol) ==================================================================== For clinical consultation, please call [phone]. ====================================================================    Summary  Date Value Ref Range Status  07/03/2015 FINAL  Final    Comment:    ==================================================================== TOXASSURE COMP DRUG ANALYSIS,UR ==================================================================== Test                             Result       Flag       Units Drug Present and Declared for  Prescription Verification   Duloxetine                     PRESENT      EXPECTED   Trazodone                      PRESENT      EXPECTED   1,3 chlorophenyl piperazine    PRESENT      EXPECTED    1,3-chlorophenyl piperazine is an expected metabolite of    trazodone.   Metoprolol                     PRESENT      EXPECTED Drug Present not Declared for Prescription Verification   Hydrocodone                    255          UNEXPECTED ng/mg creat   Hydromorphone                  197          UNEXPECTED ng/mg creat   Dihydrocodeine                 102          UNEXPECTED ng/mg creat   Norhydrocodone                 153          UNEXPECTED ng/mg creat    Sources of hydrocodone include scheduled prescription    medications. Hydromorphone, dihydrocodeine and norhydrocodone are    expected metabolites of hydrocodone. Hydromorphone and    dihydrocodeine are also available as scheduled prescription    medications. ==================================================================== Test                      Result    Flag   Units      Ref Range   Creatinine              58               mg/dL      >=20 ==================================================================== Declared Medications:  Alicia flagging and interpretation on this report are based on Alicia  following declared medications.  Unexpected results may arise from  inaccuracies in Alicia declared medications.  **Note: Alicia testing scope of this panel includes these medications:  Duloxetine (Cymbalta)  Metoprolol (Toprol)  Trazodone (Desyrel)  **Note: Alicia testing scope of this panel does not include following  reported medications:  Atorvastatin (Lipitor)  Glimepiride (Amaryl)  Insulin (Lantus)  Levothyroxine  Telmisartan (Micardis) ==================================================================== For clinical consultation, please call [phone]. ====================================================================  UDS  interpretation: Compliant          Medication Assessment Form: Reviewed. [name] indicates being compliant with therapy Treatment compliance: Compliant Risk Assessment Profile: Aberrant behavior: See prior evaluations. None observed or detected today Comorbid factors increasing risk of overdose: See prior notes. No additional risks detected today Risk of substance use disorder (SUD): Low Opioid Risk Tool (ORT) Total Score: 0  Interpretation Table:  Score <3 = Low Risk for SUD  Score between 4-7 = Moderate Risk for SUD  Score >8 = High Risk for Opioid Abuse   Risk Mitigation Strategies:  [name] Counseling: Covered [name]-Prescriber Agreement (PPA): Present and active  Notification to other healthcare providers: Done  Pharmacologic Plan: No change in therapy, at this time  Laboratory Chemistry  Inflammation Markers (CRP: Acute Phase) (ESR: Chronic Phase) Lab Results  Component Value Date   CRP 0.6 11/07/2015   ESRSEDRATE 16 11/07/2015                 Renal Function Markers Lab Results  Component Value Date   BUN 33 (H) 07/22/2015   CREATININE 2.33 (H) 07/22/2015   GFRAA 24 (L) 07/22/2015   GFRNONAA 21 (L) 07/22/2015                 Hepatic Function Markers Lab Results  Component Value Date   AST 14 (L) 02/22/2015   ALT 13 (L) 02/22/2015   ALBUMIN 2.5 (L) 07/21/2015   ALKPHOS 86 02/22/2015                 Electrolytes Lab Results  Component Value Date   NA 138 07/22/2015   K 4.2 07/22/2015   CL 110 07/22/2015   CALCIUM 7.6 (L) 07/22/2015   MG 2.2 11/07/2015                 Neuropathy Markers Lab Results  Component Value Date   VITAMINB12 273 11/07/2015                 Bone Pathology Markers Lab Results  Component Value Date   ALKPHOS 86 02/22/2015   25OHVITD1 43 11/07/2015   25OHVITD2 38 11/07/2015   25OHVITD3 5.4 11/07/2015   CALCIUM 7.6 (L) 07/22/2015                 Coagulation Parameters Lab Results  Component Value Date   INR 1.28  02/22/2015   LABPROT 16.1 (H) 02/22/2015   APTT 36 02/22/2015   PLT 315 07/22/2015                 Cardiovascular Markers Lab Results  Component Value Date   HGB 8.9 (L) 07/22/2015   HCT 28.0 (L) 07/22/2015                 Note: Lab results reviewed.  Recent Diagnostic Imaging Review  No results found. Note: Imaging results reviewed.          Meds   Current Meds  Medication Sig  . amLODipine (NORVASC) 5 MG tablet Take 1 tablet (5 mg total) by mouth daily.  Marland Kitchen atorvastatin (LIPITOR) 40 MG tablet Take 40 mg by mouth daily at 6 PM.   . calcitRIOL (ROCALTROL) 0.25 MCG capsule Take 1 capsule by mouth every other day.  . clobetasol cream (TEMOVATE) 1.10 % Apply 1 application topically daily.  . DULoxetine (CYMBALTA) 30 MG capsule Take 30 mg by mouth daily.   . Ergocalciferol (VITAMIN D2) 2000 units TABS Take 1.25 mg by  mouth once a week.   . famotidine (PEPCID) 20 MG tablet Take 1 tablet (20 mg total) by mouth 2 (two) times daily.  Marland Kitchen glimepiride (AMARYL) 4 MG tablet Take 2 mg by mouth at bedtime. 1 tab AM, 1/2 tab PM  . glucose blood (ONE TOUCH ULTRA TEST) test strip TEST TWICE DAILY  . insulin glargine (LANTUS) 100 UNIT/ML injection Inject 14 Units into Alicia skin at bedtime.   Marland Kitchen levothyroxine (SYNTHROID, LEVOTHROID) 88 MCG tablet TAKE 1 TABLET BY MOUTH ONCE A DAY FOR THYROID  . metoprolol succinate (TOPROL-XL) 25 MG 24 hr tablet TAKE 1 TABLET BY MOUTH ONCE A DAY  . nystatin (MYCOSTATIN/NYSTOP) powder Apply topically 4 (four) times daily.  Glory Rosebush DELICA LANCETS 00Q MISC TEST TWICE DAILY  . [START ON 11/07/2016] oxyCODONE (OXY IR/ROXICODONE) 5 MG immediate release tablet Take 1 tablet (5 mg total) by mouth every 8 (eight) hours as needed for severe pain.  Derrill Memo ON 09/08/2016] oxyCODONE (OXY IR/ROXICODONE) 5 MG immediate release tablet Take 1 tablet (5 mg total) by mouth every 8 (eight) hours as needed for severe pain.  Derrill Memo ON 10/08/2016] oxyCODONE (OXY IR/ROXICODONE) 5 MG  immediate release tablet Take 1 tablet (5 mg total) by mouth every 8 (eight) hours as needed for severe pain.  Marland Kitchen telmisartan (MICARDIS) 80 MG tablet TAKE 1 TABLET BY MOUTH ONCE A DAY  . Vitamin D, Ergocalciferol, (DRISDOL) 50000 units CAPS capsule Take 50,000 Units by mouth every 7 (seven) days.     ROS  Constitutional: Denies any fever or chills Gastrointestinal: No reported hemesis, hematochezia, vomiting, or acute GI distress Musculoskeletal: Denies any acute onset joint swelling, redness, loss of ROM, or weakness Neurological: No reported episodes of acute onset apraxia, aphasia, dysarthria, agnosia, amnesia, paralysis, loss of coordination, or loss of consciousness  Allergies  Ms. Ekblad is allergic to buspirone; citalopram; lisinopril; metformin; pravastatin; sitagliptin; tramadol; diltiazem hcl; gabapentin; hydralazine; and lovastatin.  [place]  Drug: Ms. Wheeling  reports that she does not use drugs. Alcohol:  reports that she does not drink alcohol. Tobacco:  reports that she quit smoking about 3 years ago. Her smoking use included Cigarettes. She has never used smokeless tobacco. Medical:  has a past medical history of Anemia; Anxiety; Aortic valve stenosis; Arthritis; B12 deficiency; Bowel obstruction ([place]); CKD (chronic kidney disease); Colostomy in place [facility]); Diabetes mellitus without complication ([place]); Diverticulitis large intestine; Dysrhythmia; Ectopic atrial tachycardia (HCC); FSGS (focal segmental glomerulosclerosis); Gastritis; GI bleed; Heart murmur; Hyperlipidemia; Hypertension; Hypothyroidism; Hypothyroidism; MRSA (methicillin resistant Staphylococcus aureus); Neuropathy; Neuropathy in diabetes ([place]); Obesity; Pedal edema; Psoriasis; Shortness of breath dyspnea; and Vertigo. Surgical: Ms. Guarisco  has a past surgical history that includes Colectomy; laparotomy closure of cecal perforation (05/09/2013); Tonsillectomy; Back surgery; Cardiac catheterization; Abdominal hysterectomy;  Debridement of abdominal wall abscess (N/A, 02/22/2015); Excision mass abdominal (N/A, 02/24/2015); Application if wound vac (N/A, 02/24/2015); Excision mass abdominal (N/A, 02/26/2015); Application if wound vac (N/A, 02/26/2015); Wound debridement (N/A, 03/03/2015); Wound debridement (N/A, 03/09/2015); Flexible sigmoidoscopy (06/19/2015); Colostomy reversal (N/A, 07/12/2015); laparotomy (07/12/2015); laparoscopy (07/12/2015); Lysis of adhesion (07/12/2015); laparotomy (N/A, 02/06/2015); and Colonoscopy with propofol (N/A, 09/04/2016). Family: family history includes CAD in her father; Colon cancer in her brother; Diabetes in her mother; Heart attack in her father; Hypertension in her father.  Constitutional Exam  General appearance: in no distress and morbidly obese Vitals:   09/05/16 1310 09/05/16 1311  BP:  (!) 157/79  Pulse: 72   Resp: 16   Temp: 98.2 F (36.8  C)   TempSrc: Oral   SpO2: 97%   Weight: 220 lb (99.8 kg)   Height: '5\' 3"'  (1.6 m)    BMI Assessment: Estimated body mass index is 38.97 kg/m as calculated from Alicia following:   Height as of this encounter: '5\' 3"'  (1.6 m).   Weight as of this encounter: 220 lb (99.8 kg).  BMI interpretation table: BMI level Category Range association with higher incidence of chronic pain  <18 kg/m2 Underweight   18.5-24.9 kg/m2 Ideal body weight   25-29.9 kg/m2 Overweight Increased incidence by 20%  30-34.9 kg/m2 Obese (Class I) Increased incidence by 68%  35-39.9 kg/m2 Severe obesity (Class II) Increased incidence by 136%  >40 kg/m2 Extreme obesity (Class III) Increased incidence by 254%   BMI Readings from Last 4 Encounters:  09/05/16 38.97 kg/m  09/04/16 38.97 kg/m  06/06/16 38.09 kg/m  03/20/16 38.83 kg/m   Wt Readings from Last 4 Encounters:  09/05/16 220 lb (99.8 kg)  09/04/16 220 lb (99.8 kg)  06/06/16 215 lb (97.5 kg)  03/20/16 219 lb 3.2 oz (99.4 kg)  Psych/Mental status: Alert, oriented x 3 (person, place, & time)       Eyes:  PERLA Respiratory: No evidence of acute respiratory distress  Cervical Spine Exam  Inspection: No masses, redness, or swelling Alignment: Symmetrical Functional ROM: Unrestricted ROM      Stability: No instability detected Muscle strength & Tone: Functionally intact Sensory: Unimpaired Palpation: No palpable anomalies              Upper Extremity (UE) Exam    Side: Right upper extremity  Side: Left upper extremity  Inspection: No masses, redness, swelling, or asymmetry. No contractures  Inspection: No masses, redness, swelling, or asymmetry. No contractures  Functional ROM: Unrestricted ROM          Functional ROM: Unrestricted ROM          Muscle strength & Tone: Functionally intact  Muscle strength & Tone: Functionally intact  Sensory: Unimpaired  Sensory: Unimpaired  Palpation: No palpable anomalies              Palpation: No palpable anomalies              Specialized Test(s): Deferred         Specialized Test(s): Deferred          Thoracic Spine Exam  Inspection: No masses, redness, or swelling Alignment: Symmetrical Functional ROM: Unrestricted ROM Stability: No instability detected Sensory: Unimpaired Muscle strength & Tone: No palpable anomalies  Lumbar Spine Exam  Inspection: No masses, redness, or swelling Alignment: Symmetrical Functional ROM: Unrestricted ROM      Stability: No instability detected Muscle strength & Tone: Functionally intact Sensory: Unimpaired Palpation: Complains of area being tender to palpation       Provocative Tests: Lumbar Hyperextension and rotation test: evaluation deferred today       Lumbar Lateral bending test: evaluation deferred today       Patrick's Maneuver: evaluation deferred today                    Gait & Posture Assessment  Ambulation: [name] ambulates using a cane Gait: Relatively normal for age and body habitus Posture: WNL   Lower Extremity Exam    Side: Right lower extremity  Side: Left lower extremity   Inspection: No masses, redness, swelling, or asymmetry. No contractures  Inspection: No masses, redness, swelling, or asymmetry. No contractures  Functional ROM: Unrestricted ROM  Functional ROM: Unrestricted ROM          Muscle strength & Tone: Functionally intact  Muscle strength & Tone: Functionally intact  Sensory: Unimpaired  Sensory: Unimpaired  Palpation: No palpable anomalies  Palpation: No palpable anomalies   Assessment  Primary Diagnosis & Pertinent Problem List: Alicia primary encounter diagnosis was Chronic low back pain (Location of Secondary source of pain) (Bilateral) (L>R). Diagnoses of Chronic left hip pain, Chronic knee pain (Bilateral) (L>R), Chronic pain syndrome, and Long term current use of opiate analgesic were also pertinent to this visit.  Status Diagnosis  Controlled Controlled Controlled 1. Chronic low back pain (Location of Secondary source of pain) (Bilateral) (L>R)   2. Chronic left hip pain   3. Chronic knee pain (Bilateral) (L>R)   4. Chronic pain syndrome   5. Long term current use of opiate analgesic     Problems updated and reviewed during this visit: Problem  Chronic Pain Syndrome  Lumbar Spondylosis  Lumbar facet syndrome (Bilateral) (L>R)  Chronic abdominal pain (Location of Primary Source of Pain) (Bilateral) (L>R)  Neurogenic Pain  Musculoskeletal Pain  Visceral Abdominal Pain  Chronic low back pain (Location of Secondary source of pain) (Bilateral) (L>R)  Chronic lower extremity pain (Location of Tertiary source of pain) (Bilateral) (R>L)  Chronic hip pain (Bilateral) (L>R)  Chronic knee pain (Bilateral) (L>R)  Chronic neck pain (Bilateral) (L>R)  Long Term Current Use of Opiate Analgesic  Long Term Prescription Opiate Use  Opiate use (22.5 MME/Day)  Hyperlipidemia, Mixed  Hx of Adenomatous Colonic Polyps  Fsgs (Focal Segmental Glomerulosclerosis)   Overview:  evaluated by Dr. Kathrynn Ducking, [facility] nephrology   Obesity, Class II,  Bmi 35-39.9  Osteopenia of Multiple Sites   Overview:  By DEXA 12/17   Heart Burn  Polyneuropathy  Recurrent Major Depressive Disorder, in Full Remission (Hcc)  Diabetic Neuropathy (Hcc)  H/O Colostomy  Encounter for Therapeutic Drug Level Monitoring  Encounter for Pain Management Planning  Diabetic Peripheral Neuropathy (Hcc)  Absolute Anemia  Aortic Heart Valve Narrowing   Overview:  moderat by echo 9/11; stable by echo 6/14   Arthritis   Overview:  DDD   Chronic Kidney Disease (Ckd), Stage III (Moderate)  Focal and Segmental Hyalinosis   Overview:  evaluated by Dr. Kathrynn Ducking, [facility] nephrology   Bleeding Gastrointestinal  Acquired Atrophy of Thyroid  Psoriasis   Overview:  Dr. Tyler Deis Overview:  Dr. Marlowe Alt bowel obstruction ([place])  Paroxysmal Supraventricular Tachycardia (Hcc)  [place] (Shortness of Breath On Exertion)  Benign Essential Htn  Obesity  B12 Deficiency  Type 2 Diabetes Mellitus (Hcc)  Abnormal Presence of Protein in Urine   Plan of Care  Pharmacotherapy (Medications Ordered): Meds ordered this encounter  Medications  . oxyCODONE (OXY IR/ROXICODONE) 5 MG immediate release tablet    Sig: Take 1 tablet (5 mg total) by mouth every 8 (eight) hours as needed for severe pain.    Dispense:  90 tablet    Refill:  0    Do not place this medication, or any other prescription from our practice, on "Automatic Refill". [name] may have prescription filled one day early if pharmacy is closed on scheduled refill date. Do not fill until: 11/07/2016 To last until: 12/07/2016    Order Specific Question:   Supervising Provider    Answer:   Milinda Pointer [phone]  . oxyCODONE (OXY IR/ROXICODONE) 5 MG immediate release tablet    Sig: Take 1 tablet (5 mg total) by mouth every 8 (  eight) hours as needed for severe pain.    Dispense:  90 tablet    Refill:  0    Do not place this medication, or any other prescription from our practice, on "Automatic  Refill". [name] may have prescription filled one day early if pharmacy is closed on scheduled refill date. Do not fill until: 09/08/2016 To last until: 10/08/2016    Order Specific Question:   Supervising Provider    Answer:   Milinda Pointer [phone]  . oxyCODONE (OXY IR/ROXICODONE) 5 MG immediate release tablet    Sig: Take 1 tablet (5 mg total) by mouth every 8 (eight) hours as needed for severe pain.    Dispense:  90 tablet    Refill:  0    Do not place this medication, or any other prescription from our practice, on "Automatic Refill". [name] may have prescription filled one day early if pharmacy is closed on scheduled refill date. Do not fill until:10/08/2016 To last until: 11/07/2016    Order Specific Question:   Supervising Provider    Answer:   Milinda Pointer [phone]   New Prescriptions   No medications on file   Medications administered today: Ms. Kiely had no medications administered during this visit. Lab-work, procedure(s), and/or referral(s): Orders Placed This Encounter  Procedures  . ToxASSURE Select 13 (MW), Urine   Imaging and/or referral(s): None  Interventional therapies: Planned, scheduled, and/or pending:   Continue with current regimen. Once abdominal wound heals will consider interventional therapy    Considering:   Diagnostic bilateral lumbar facet block  Possible bilateral lumbar facet rate frequency ablation.  Diagnostic bilateral celiac plexus block  Diagnostic bilateral intra-articular hip injection  Possible diagnostic bilateral femoral nerve and obturator nerve articular branch blocks  Possible bilateral femoral nerve and obturator nerve articular branch frequency ablation.  Diagnostic bilateral intra-articular knee joint injection  Possible series of 5 bilateral intra-articular Hyalgan knee injections.  Bilateral diagnostic genicular nerve block  Possible bilateral genicular nerve radiofrequency ablation    Palliative PRN  treatment(s):   Diagnostic bilateral lumbar facet block  Diagnostic bilateral celiac plexus block  Diagnostic bilateral intra-articular hip injection  Possible diagnostic bilateral femoral nerve and obturator nerve articular branch blocks  Diagnostic bilateral intra-articular knee joint injection Bilateral diagnostic genicular nerve block      Provider-requested follow-up: Return in about 3 months (around 12/06/2016) for MedMgmt.  Future Appointments Date Time Provider [place]  12/03/2016 1:30 PM Vevelyn Francois, NP [facility] None   Primary Care Physician: Adin Hector, MD Location: [facility] Note by: Vevelyn Francois NP Date: 09/05/2016; Time: 2:04 PM  Pain Score Disclaimer: We use Alicia NRS-11 scale. This is a self-reported, subjective measurement of pain severity with only modest accuracy. It is used primarily to identify changes within a particular [name]. It must be understood that outpatient pain scales are significantly less accurate that those used for research, where they can be applied under ideal controlled circumstances with minimal exposure to variables. In reality, Alicia score is likely to be a combination of pain intensity and pain affect, where pain affect describes Alicia degree of emotional arousal or changes in action readiness caused by Alicia sensory experience of pain. Factors such as social and work situation, setting, emotional state, anxiety levels, expectation, and prior pain experience may influence pain perception and show large inter-individual differences that may also be affected by time variables.  [name] instructions provided during this appointment: [name] Instructions    ____________________________________________________________________________________________  Medication Rules  Applies to: All patients receiving prescriptions (written or electronic).  Pharmacy of record: Pharmacy where electronic  prescriptions will be sent. If written prescriptions are taken to a different pharmacy, please inform Alicia nursing staff. Alicia pharmacy listed in Alicia electronic medical record should be Alicia one where you would like electronic prescriptions to be sent.  Prescription refills: Only during scheduled appointments. Applies to both, written and electronic prescriptions.  NOTE: Alicia following applies primarily to controlled substances (Opioid* Pain Medications).   [name]'s responsibilities: 1. Pain Pills: Bring all pain pills to every appointment (except for procedure appointments). 2. Pill Bottles: Bring pills in original pharmacy bottle. Always bring newest bottle. Bring bottle, even if empty. 3. Medication refills: You are responsible for knowing and keeping track of what medications you need refilled. Alicia day before your appointment, write a list of all prescriptions that need to be refilled. Bring that list to your appointment and give it to Alicia admitting nurse. Prescriptions will be written only during appointments. If you forget a medication, it will not be "Called in", "Faxed", or "electronically sent". You will need to get another appointment to get these prescribed. 4. Prescription Accuracy: You are responsible for carefully inspecting your prescriptions before leaving our office. Have Alicia discharge nurse carefully go over each prescription with you, before taking them home. Make sure that your name is accurately spelled, that your address is correct. Check Alicia name and dose of your medication to make sure it is accurate. Check Alicia number of pills, and Alicia written instructions to make sure they are clear and accurate. Make sure that you are given enough medication to last until your next medication refill appointment. 5. Taking Medication: Take medication as prescribed. Never take more pills than instructed. Never take medication more frequently than prescribed. Taking less pills or less frequently is  permitted and encouraged, when it comes to controlled substances (written prescriptions).  6. Inform other Doctors: Always inform, all of your healthcare providers, of all Alicia medications you take. 7. Pain Medication from other Providers: You are not allowed to accept any additional pain medication from any other Doctor or Healthcare provider. There are two exceptions to this rule. (see below) In Alicia event that you require additional pain medication, you are responsible for notifying us, as stated below. 8. Medication Agreement: You are responsible for carefully reading and following our Medication Agreement. This must be signed before receiving any prescriptions from our practice. Safely store a copy of your signed Agreement. Violations to Alicia Agreement will result in no further prescriptions. (Additional copies of our Medication Agreement are available upon request.) 9. Laws, Rules, & Regulations: All patients are expected to follow all Federal and [employer], Rules, [employer]. Ignorance of Alicia Laws does not constitute a valid excuse. Alicia use of any illegal substances is prohibited. 10. Adopted CDC guidelines & recommendations: Target dosing levels will be at or below 60 MME/day. Use of benzodiazepines** is not recommended.  Exceptions: There are only two exceptions to Alicia rule of not receiving pain medications from other Healthcare Providers. 1. Exception #1 (Emergencies): In Alicia event of an emergency (i.e.: accident requiring emergency care), you are allowed to receive additional pain medication. However, you are responsible for: As soon as you are able, call our office [phone], at any time of Alicia day or night, and leave a message stating your name, Alicia date and nature of Alicia emergency, and Alicia name and dose of Alicia medication prescribed. In  Alicia event that your call is answered by a member of our staff, make sure to document and save Alicia date, time, and Alicia name of Alicia person that  took your information.  2. Exception #2 (Planned Surgery): In Alicia event that you are scheduled by another doctor or dentist to have any type of surgery or procedure, you are allowed (for a period no longer than 30 days), to receive additional pain medication, for Alicia acute post-op pain. However, in this case, you are responsible for picking up a copy of our "Post-op Pain Management for Surgeons" handout, and giving it to your surgeon or dentist. This document is available at our office, and does not require an appointment to obtain it. Simply go to our office during business hours (Monday-Thursday from 8:00 AM to 4:00 PM) (Friday 8:00 AM to 12:00 Noon) or if you have a scheduled appointment with [place], prior to your surgery, and ask for it by name. In addition, you will need to provide [place] with your name, name of your surgeon, type of surgery, and date of procedure or surgery.  *Opioid medications include: morphine, codeine, oxycodone, oxymorphone, hydrocodone, hydromorphone, meperidine, tramadol, tapentadol, buprenorphine, fentanyl, methadone. **Benzodiazepine medications include: diazepam (Valium), alprazolam (Xanax), clonazepam (Klonopine), lorazepam (Ativan), clorazepate (Tranxene), chlordiazepoxide (Librium), estazolam (Prosom), oxazepam (Serax), temazepam (Restoril), triazolam (Halcion)  ____________________________________________________________________________________________ ____________________________________________________________________________________________  Pain Scale  Introduction: Alicia pain score used by this practice is Alicia Verbal Numerical Rating Scale (VNRS-11). This is an 11-point scale. It is for adults and children 10 years or older. There are significant differences in how Alicia pain score is reported, used, and applied. Forget everything you learned in Alicia past and learn this scoring system.  General Information: Alicia scale should reflect your current level of pain. Unless you are  specifically asked for Alicia level of your worst pain, or your average pain. If you are asked for one of these two, then it should be understood that it is over Alicia past 24 hours.  Basic Activities of Daily Living (ADL): Personal hygiene, dressing, eating, transferring, and using restroom.  Instructions: Most patients tend to report their level of pain as a combination of two factors, their physical pain and their psychosocial pain. This last one is also known as "suffering" and it is reflection of how physical pain affects you socially and psychologically. From now on, report them separately. From this point on, when asked to report your pain level, report only your physical pain. Use Alicia following table for reference.  Pain Clinic Pain Levels (0-5/10)  Pain Level Score  Description  No Pain 0   Mild pain 1 Nagging, annoying, but does not interfere with basic activities of daily living (ADL). Patients are able to eat, bathe, get dressed, toileting (being able to get on and off Alicia toilet and perform personal hygiene functions), transfer (move in and out of bed or a chair without assistance), and maintain continence (able to control bladder and bowel functions). Blood pressure and heart rate are unaffected. A normal heart rate for a healthy adult ranges from 60 to 100 bpm (beats per minute).   Mild to moderate pain 2 Noticeable and distracting. Impossible to hide from other people. More frequent flare-ups. Still possible to adapt and function close to normal. It can be very annoying and may have occasional stronger flare-ups. With discipline, patients may get used to it and adapt.   Moderate pain 3 Interferes significantly with activities of daily living (ADL). It becomes difficult to feed, bathe,  get dressed, get on and off Alicia toilet or to perform personal hygiene functions. Difficult to get in and out of bed or a chair without assistance. Very distracting. With effort, it can be ignored when deeply  involved in activities.   Moderately severe pain 4 Impossible to ignore for more than a few minutes. With effort, patients may still be able to manage work or participate in some social activities. Very difficult to concentrate. Signs of autonomic nervous system discharge are evident: dilated pupils (mydriasis); mild sweating (diaphoresis); sleep interference. Heart rate becomes elevated (>115 bpm). Diastolic blood pressure (lower number) rises above 100 mmHg. Patients find relief in laying down and not moving.   Severe pain 5 Intense and extremely unpleasant. Associated with frowning face and frequent crying. Pain overwhelms Alicia senses.  Ability to do any activity or maintain social relationships becomes significantly limited. Conversation becomes difficult. Pacing back and forth is common, as getting into a comfortable position is nearly impossible. Pain wakes you up from deep sleep. Physical signs will be obvious: pupillary dilation; increased sweating; goosebumps; brisk reflexes; cold, clammy hands and feet; nausea, vomiting or dry heaves; loss of appetite; significant sleep disturbance with inability to fall asleep or to remain asleep. When persistent, significant weight loss is observed due to Alicia complete loss of appetite and sleep deprivation.  Blood pressure and heart rate becomes significantly elevated. Caution: If elevated blood pressure triggers a pounding headache associated with blurred vision, then [name] should immediately seek attention at an urgent or emergency care unit, as these may be signs of an impending stroke.    Emergency Department Pain Levels (6-10/10)  Emergency Room Pain 6 Severely limiting. Requires emergency care and should not be seen or managed at an outpatient pain management facility. Communication becomes difficult and requires great effort. Assistance to reach Alicia emergency department may be required. Facial flushing and profuse sweating along with potentially  dangerous increases in heart rate and blood pressure will be evident.   Distressing pain 7 Self-care is very difficult. Assistance is required to transport, or use restroom. Assistance to reach Alicia emergency department will be required. Tasks requiring coordination, such as bathing and getting dressed become very difficult.   Disabling pain 8 Self-care is no longer possible. At this level, pain is disabling. Alicia individual is unable to do even Alicia most "basic" activities such as walking, eating, bathing, dressing, transferring to a bed, or toileting. Fine motor skills are lost. It is difficult to think clearly.   Incapacitating pain 9 Pain becomes incapacitating. Thought processing is no longer possible. Difficult to remember your own name. Control of movement and coordination are lost.   Alicia worst pain imaginable 10 At this level, most patients pass out from pain. When this level is reached, collapse of Alicia autonomic nervous system occurs, leading to a sudden drop in blood pressure and heart rate. This in turn results in a temporary and dramatic drop in blood flow to Alicia brain, leading to a loss of consciousness. Fainting is one of Alicia body's self defense mechanisms. Passing out puts Alicia brain in a calmed state and causes it to shut down for a while, in order to begin Alicia healing process.    Summary: 1. Refer to this scale when providing [place] with your pain level. 2. Be accurate and careful when reporting your pain level. This will help with your care. 3. Over-reporting your pain level will lead to loss of credibility. 4. Even a level of 1/10 means  that there is pain and will be treated at our facility. 5. High, inaccurate reporting will be documented as "Symptom Exaggeration", leading to loss of credibility and suspicions of possible secondary gains such as obtaining more narcotics, or wanting to appear disabled, for fraudulent reasons. 6. Only pain levels of 5 or below will be seen at our  facility. 7. Pain levels of 6 and above will be sent to Alicia Emergency Department and Alicia appointment cancelled.  You were given 3 prescriptions for Oxycodone today. ____________________________________________________________________________________________

## 2016-09-05 NOTE — Progress Notes (Signed)
Nursing Pain Medication Assessment:  Safety precautions to be maintained throughout the outpatient stay will include: orient to surroundings, keep bed in low position, maintain call bell within reach at all times, provide assistance with transfer out of bed and ambulation.  Medication Inspection Compliance: Pill count conducted under aseptic conditions, in front of the patient. Neither the pills nor the bottle was removed from the patient's sight at any time. Once count was completed pills were immediately returned to the patient in their original bottle.  Medication: Oxycodone IR Pill/Patch Count: 62 of 90 pills remain Pill/Patch Appearance: Markings consistent with prescribed medication Bottle Appearance: Standard pharmacy container. Clearly labeled. Filled Date:06/22/ 2018 Last Medication intake:  Today

## 2016-09-12 LAB — TOXASSURE SELECT 13 (MW), URINE

## 2016-10-25 IMAGING — CT CT ABD-PELV W/O CM
1 of 2 series · 14 of 32 positions shown, 18 images · non-contrast
Comparison: 02/10/2015

CLINICAL DATA: foul smelling drainage from surgical incision site,
history of hernia repair left abdominal wall

EXAM:
CT ABDOMEN AND PELVIS WITHOUT CONTRAST
TECHNIQUE: Multidetector CT imaging of the abdomen and pelvis was performed
following the standard protocol without IV contrast.

[Series 2: routine abd pel without · axial · non-contrast · 0.95mm/px · z∈[-658,-218]mm · 14 of 96 slices shown, 18 images]
[im 4/96  soft-tissue]
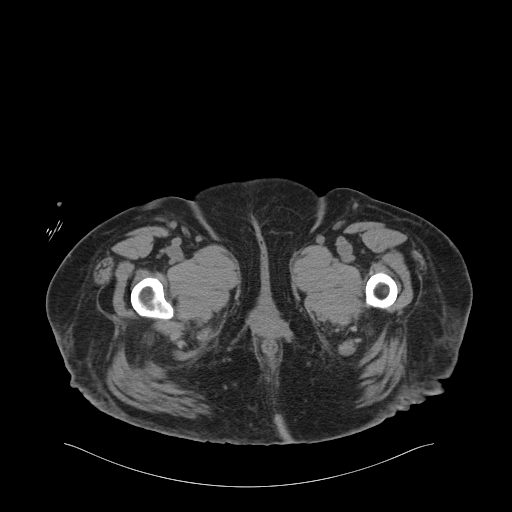
[im 4/96  bone]
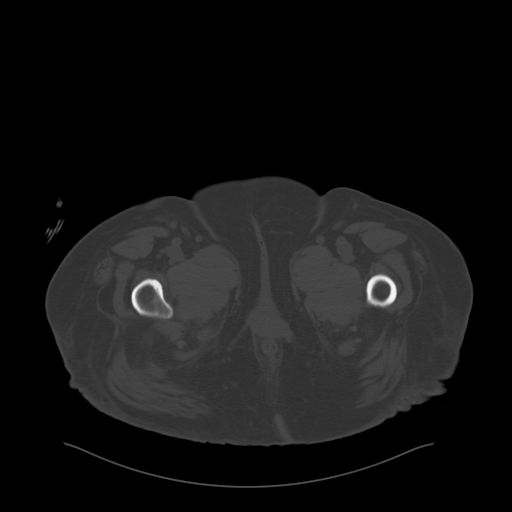
[im 12/96  soft-tissue]
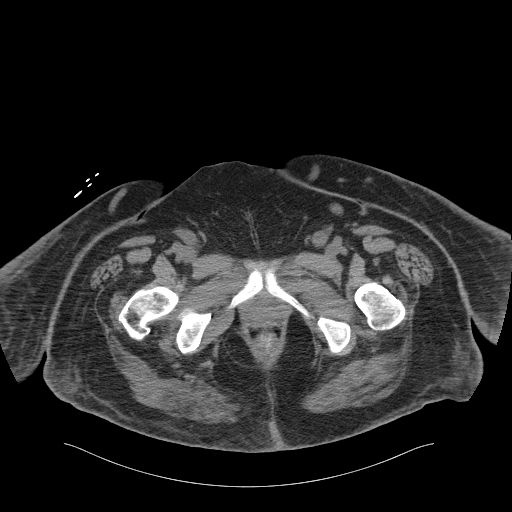
[im 20/96  soft-tissue]
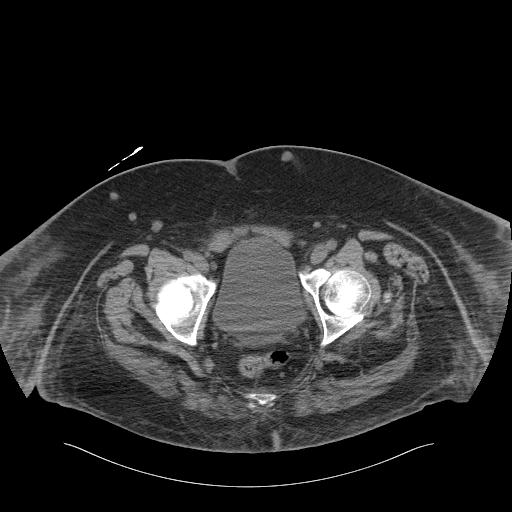
[im 28/96  soft-tissue]
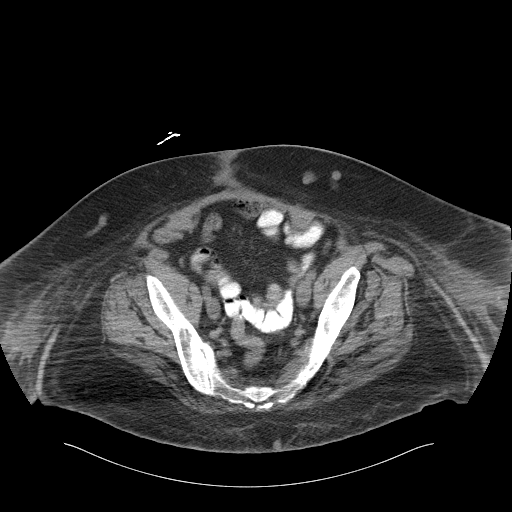
[im 36/96  soft-tissue]
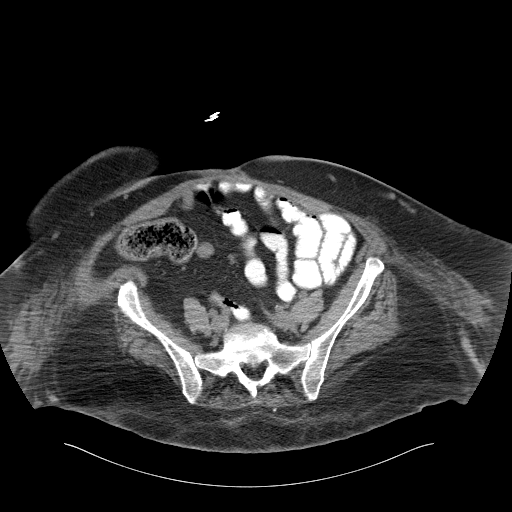
[im 44/96  soft-tissue]
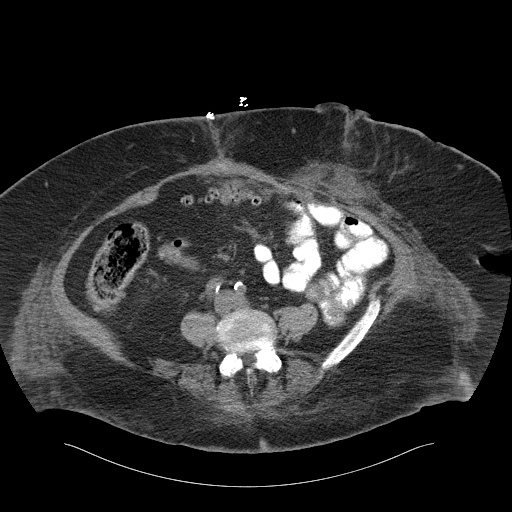
[im 52/96  soft-tissue]
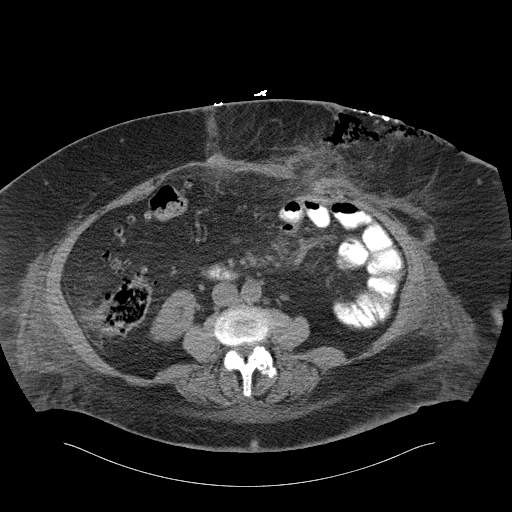
[im 60/96  soft-tissue]
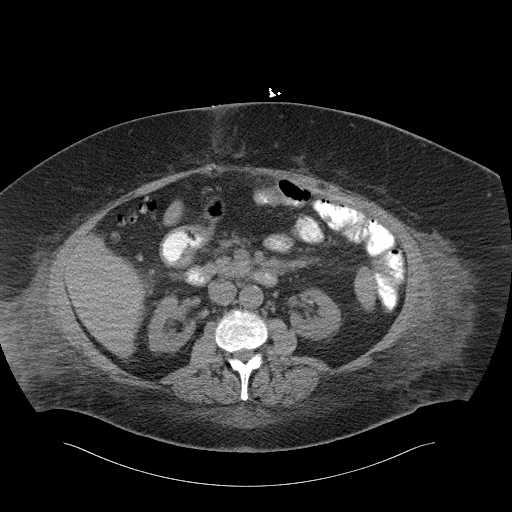
[im 68/96  soft-tissue]
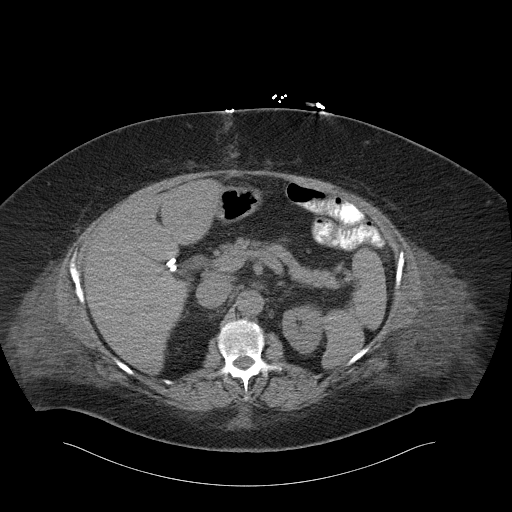
[im 68/96  bone]
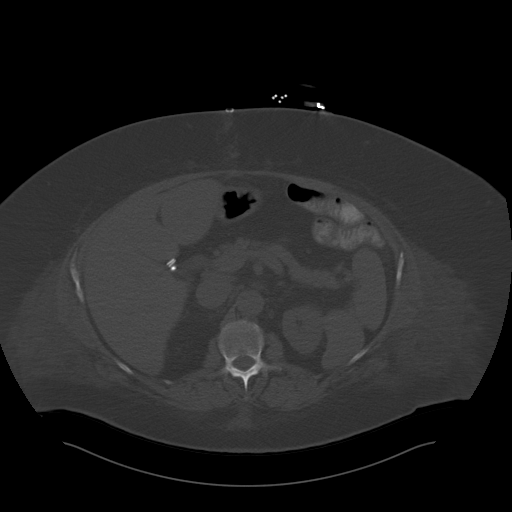
[im 76/96  soft-tissue]
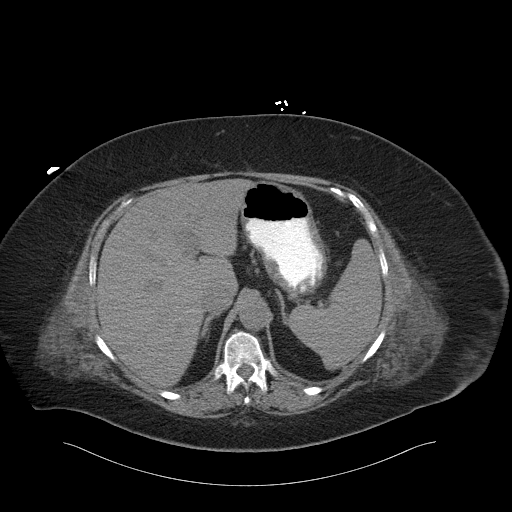
[im 80/96  lung]
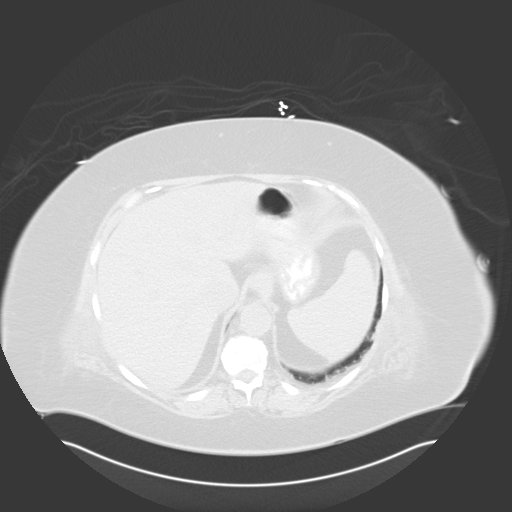
[im 84/96  soft-tissue]
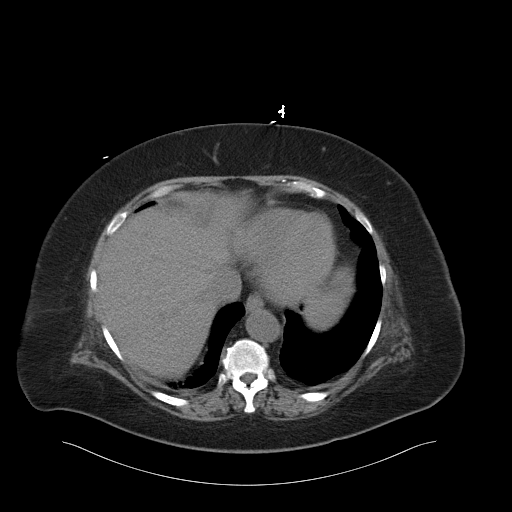
[im 84/96  lung]
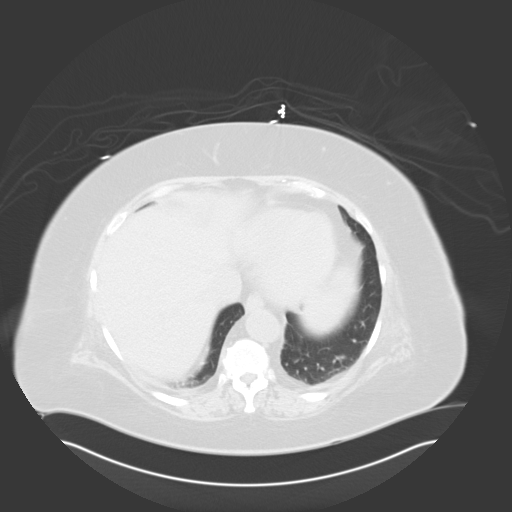
[im 88/96  lung]
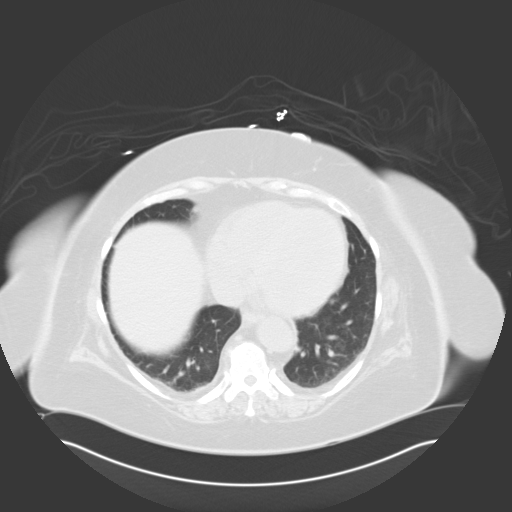
[im 92/96  soft-tissue]
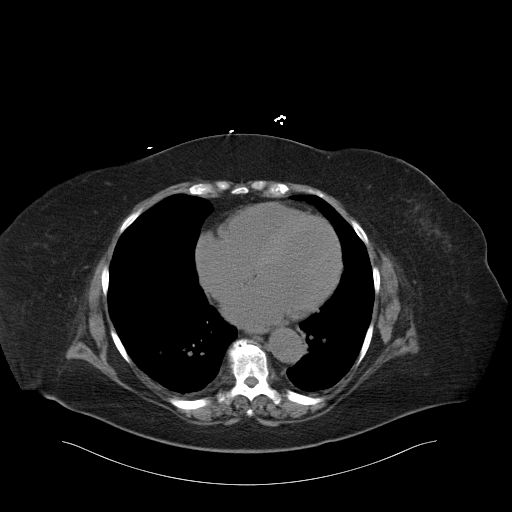
[im 92/96  lung]
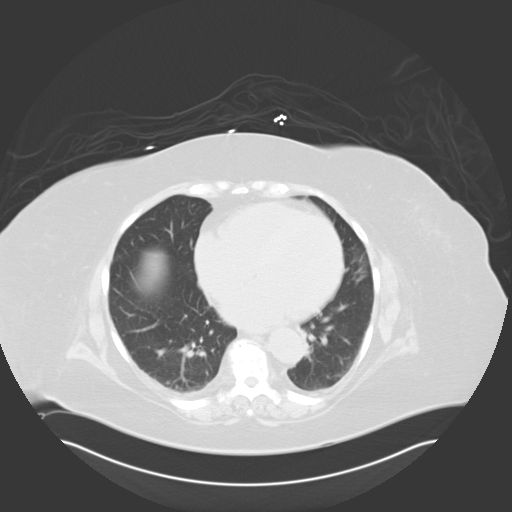

[14 of 32 positions shown; findings below may reference images not displayed]

FINDINGS: Lung bases are unremarkable. Mild anasarca infiltration of
subcutaneous fat bilateral flank wall.

There are streaky artifacts from patient's large body habitus.

Mild degenerative changes lower thoracic spine.

Unenhanced liver shows no biliary ductal dilatation. The patient is
status postcholecystectomy. The pancreas, spleen and adrenal glands
are unremarkable. Atherosclerotic calcifications of distal abdominal
aorta and iliac arteries.

Normal appendix is partially visualized in axial image 58. There is
no pericecal inflammation. Some colonic stool noted in right colon.
Colonic diverticula are noted right colon and transverse colon.
Again noted a colostomy in the left abdominal wall. This is best
seen in axial image 51. There is small parastomal hernia containing
fat measures about 4.8 cm. Again noted stranding of subcutaneous fat
and small amount of fluid just left lateral to colostomy. There is
increased subcutaneous air with bubbly appearance extending
superficial to left anterior abdominal wall at the level of skin
staples. Findings are highly suspicious for colonic cutaneous
fistula and adjacent inflammatory changes in subcutaneous fat. There
is no evidence of drainable abscess. There is skin thickening in the
region of skin staples left abdominal wall suspicious for dermatitis
or cellulitis.
IMPRESSION: 1. The patient is status post subtotal left hemicolectomy with a
colostomy in left abdominal wall. Small parastomal hernia containing
fat seen in axial image 47 measures 4.8 cm.
2. Again noted stranding of subcutaneous fat and small amount of
fluid just left lateral to colostomy. There is increased
subcutaneous air with bubbly appearance extending superficial to
left anterior abdominal wall at the level of skin staples. Findings
are highly suspicious for colonic cutaneous fistula and adjacent
inflammatory changes in subcutaneous fat. Please see axial image 43
and axial image 47. There is no evidence of drainable abscess.
3. No hydronephrosis or hydroureter.
4. Mild anasarca infiltration of subcutaneous fat bilateral flank
wall.
These results were called by telephone at the time of interpretation
on 02/21/2015 at [DATE] to Dr. POPO HELMY , who verbally
acknowledged these results.

## 2016-11-04 IMAGING — CR DG CHEST 1V PORT
1 series · 1 of 1 positions shown · non-contrast
Comparison: 02/06/2015

CLINICAL DATA: Right-sided PICC line placement.

EXAM:
PORTABLE CHEST 1 VIEW

[ap]
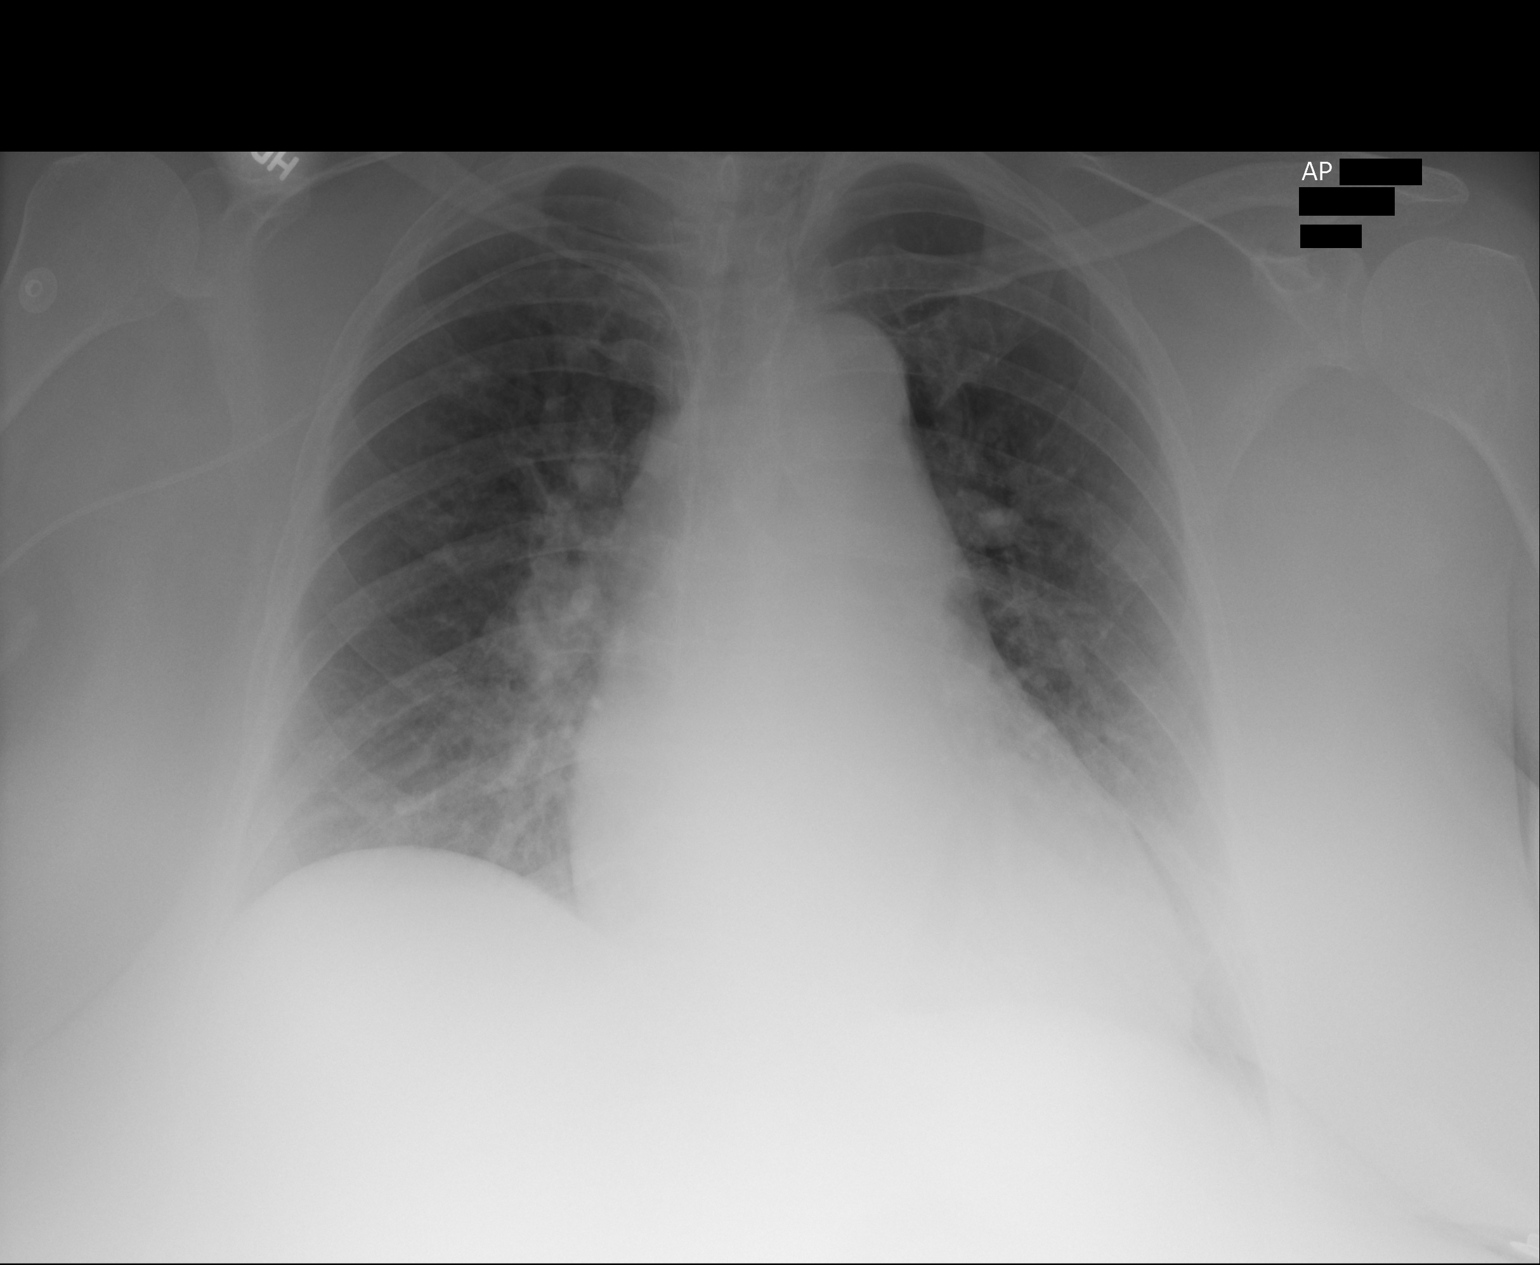

[1 of 1 positions shown; findings below may reference images not displayed]

FINDINGS: Patient slightly rotated to the left. Right-sided PICC line is
present with tip at the cavoatrial junction. Lungs are adequately
inflated without focal consolidation or effusion. Cardiomediastinal
silhouette and remainder of the exam is unchanged.
IMPRESSION: No acute cardiopulmonary disease.

Right-sided PICC line with tip over the cavoatrial junction.

## 2016-12-03 ENCOUNTER — Ambulatory Visit: Payer: Medicare Other | Attending: Nurse Practitioner | Admitting: Nurse Practitioner

## 2016-12-03 ENCOUNTER — Encounter: Payer: Self-pay | Admitting: Nurse Practitioner

## 2016-12-03 VITALS — BP 129/64 | HR 77 | Temp 98.1°F | Resp 18 | Ht 63.0 in | Wt 219.0 lb

## 2016-12-03 DIAGNOSIS — N183 Chronic kidney disease, stage 3 (moderate): Secondary | ICD-10-CM | POA: Insufficient documentation

## 2016-12-03 DIAGNOSIS — G894 Chronic pain syndrome: Secondary | ICD-10-CM | POA: Diagnosis not present

## 2016-12-03 DIAGNOSIS — I129 Hypertensive chronic kidney disease with stage 1 through stage 4 chronic kidney disease, or unspecified chronic kidney disease: Secondary | ICD-10-CM | POA: Insufficient documentation

## 2016-12-03 DIAGNOSIS — Z833 Family history of diabetes mellitus: Secondary | ICD-10-CM | POA: Diagnosis not present

## 2016-12-03 DIAGNOSIS — E782 Mixed hyperlipidemia: Secondary | ICD-10-CM | POA: Insufficient documentation

## 2016-12-03 DIAGNOSIS — M79605 Pain in left leg: Secondary | ICD-10-CM | POA: Diagnosis not present

## 2016-12-03 DIAGNOSIS — E1142 Type 2 diabetes mellitus with diabetic polyneuropathy: Secondary | ICD-10-CM | POA: Insufficient documentation

## 2016-12-03 DIAGNOSIS — M25551 Pain in right hip: Secondary | ICD-10-CM | POA: Diagnosis not present

## 2016-12-03 DIAGNOSIS — M25552 Pain in left hip: Secondary | ICD-10-CM | POA: Insufficient documentation

## 2016-12-03 DIAGNOSIS — M5442 Lumbago with sciatica, left side: Secondary | ICD-10-CM

## 2016-12-03 DIAGNOSIS — M7918 Myalgia, other site: Secondary | ICD-10-CM | POA: Insufficient documentation

## 2016-12-03 DIAGNOSIS — M5441 Lumbago with sciatica, right side: Secondary | ICD-10-CM

## 2016-12-03 DIAGNOSIS — K56609 Unspecified intestinal obstruction, unspecified as to partial versus complete obstruction: Secondary | ICD-10-CM | POA: Insufficient documentation

## 2016-12-03 DIAGNOSIS — R109 Unspecified abdominal pain: Secondary | ICD-10-CM

## 2016-12-03 DIAGNOSIS — Z9889 Other specified postprocedural states: Secondary | ICD-10-CM | POA: Diagnosis not present

## 2016-12-03 DIAGNOSIS — Z794 Long term (current) use of insulin: Secondary | ICD-10-CM | POA: Diagnosis not present

## 2016-12-03 DIAGNOSIS — Z79891 Long term (current) use of opiate analgesic: Secondary | ICD-10-CM | POA: Diagnosis not present

## 2016-12-03 DIAGNOSIS — M549 Dorsalgia, unspecified: Secondary | ICD-10-CM | POA: Insufficient documentation

## 2016-12-03 DIAGNOSIS — Z90710 Acquired absence of both cervix and uterus: Secondary | ICD-10-CM | POA: Diagnosis not present

## 2016-12-03 DIAGNOSIS — E1122 Type 2 diabetes mellitus with diabetic chronic kidney disease: Secondary | ICD-10-CM | POA: Diagnosis not present

## 2016-12-03 DIAGNOSIS — Z8249 Family history of ischemic heart disease and other diseases of the circulatory system: Secondary | ICD-10-CM | POA: Insufficient documentation

## 2016-12-03 DIAGNOSIS — Z5181 Encounter for therapeutic drug level monitoring: Secondary | ICD-10-CM | POA: Diagnosis present

## 2016-12-03 DIAGNOSIS — G8929 Other chronic pain: Secondary | ICD-10-CM

## 2016-12-03 DIAGNOSIS — Z87891 Personal history of nicotine dependence: Secondary | ICD-10-CM | POA: Diagnosis not present

## 2016-12-03 DIAGNOSIS — M79604 Pain in right leg: Secondary | ICD-10-CM

## 2016-12-03 DIAGNOSIS — E669 Obesity, unspecified: Secondary | ICD-10-CM | POA: Diagnosis not present

## 2016-12-03 DIAGNOSIS — Z86010 Personal history of colonic polyps: Secondary | ICD-10-CM | POA: Diagnosis not present

## 2016-12-03 DIAGNOSIS — M545 Low back pain: Secondary | ICD-10-CM | POA: Insufficient documentation

## 2016-12-03 DIAGNOSIS — M170 Bilateral primary osteoarthritis of knee: Secondary | ICD-10-CM | POA: Insufficient documentation

## 2016-12-03 DIAGNOSIS — Z79899 Other long term (current) drug therapy: Secondary | ICD-10-CM | POA: Insufficient documentation

## 2016-12-03 MED ORDER — OXYCODONE HCL 5 MG PO TABS: 5.0000 mg | ORAL_TABLET | Freq: Three times a day (TID) | ORAL | 0 refills | Status: DC | PRN

## 2016-12-03 NOTE — Patient Instructions (Addendum)
____________________________________________________________________________________________  Medication Rules  Applies to: All patients receiving prescriptions (written or electronic).  Pharmacy of record: Pharmacy where electronic prescriptions will be sent. If written prescriptions are taken to a different pharmacy, please inform the nursing staff. The pharmacy listed in the electronic medical record should be the one where you would like electronic prescriptions to be sent.  Prescription refills: Only during scheduled appointments. Applies to both, written and electronic prescriptions.  NOTE: The following applies primarily to controlled substances (Opioid* Pain Medications).   Patient's responsibilities: 1. Pain Pills: Bring all pain pills to every appointment (except for procedure appointments). 2. Pill Bottles: Bring pills in original pharmacy bottle. Always bring newest bottle. Bring bottle, even if empty. 3. Medication refills: You are responsible for knowing and keeping track of what medications you need refilled. The day before your appointment, write a list of all prescriptions that need to be refilled. Bring that list to your appointment and give it to the admitting nurse. Prescriptions will be written only during appointments. If you forget a medication, it will not be "Called in", "Faxed", or "electronically sent". You will need to get another appointment to get these prescribed. 4. Prescription Accuracy: You are responsible for carefully inspecting your prescriptions before leaving our office. Have the discharge nurse carefully go over each prescription with you, before taking them home. Make sure that your name is accurately spelled, that your address is correct. Check the name and dose of your medication to make sure it is accurate. Check the number of pills, and the written instructions to make sure they are clear and accurate. Make sure that you are given enough medication to  last until your next medication refill appointment. 5. Taking Medication: Take medication as prescribed. Never take more pills than instructed. Never take medication more frequently than prescribed. Taking less pills or less frequently is permitted and encouraged, when it comes to controlled substances (written prescriptions).  6. Inform other Doctors: Always inform, all of your healthcare providers, of all the medications you take. 7. Pain Medication from other Providers: You are not allowed to accept any additional pain medication from any other Doctor or Healthcare provider. There are two exceptions to this rule. (see below) In the event that you require additional pain medication, you are responsible for notifying us, as stated below. 8. Medication Agreement: You are responsible for carefully reading and following our Medication Agreement. This must be signed before receiving any prescriptions from our practice. Safely store a copy of your signed Agreement. Violations to the Agreement will result in no further prescriptions. (Additional copies of our Medication Agreement are available upon request.) 9. Laws, Rules, & Regulations: All patients are expected to follow all Federal and State Laws, Statutes, Rules, & Regulations. Ignorance of the Laws does not constitute a valid excuse. The use of any illegal substances is prohibited. 10. Adopted CDC guidelines & recommendations: Target dosing levels will be at or below 60 MME/day. Use of benzodiazepines** is not recommended.  Exceptions: There are only two exceptions to the rule of not receiving pain medications from other Healthcare Providers. 1. Exception #1 (Emergencies): In the event of an emergency (i.e.: accident requiring emergency care), you are allowed to receive additional pain medication. However, you are responsible for: As soon as you are able, call our office [phone], at any time of the day or night, and leave a message stating your  name, the date and nature of the emergency, and the name and dose of the medication   prescribed. In the event that your call is answered by a member of our staff, make sure to document and save the date, time, and the name of the person that took your information.  2. Exception #2 (Planned Surgery): In the event that you are scheduled by another doctor or dentist to have any type of surgery or procedure, you are allowed (for a period no longer than 30 days), to receive additional pain medication, for the acute post-op pain. However, in this case, you are responsible for picking up a copy of our "Post-op Pain Management for Surgeons" handout, and giving it to your surgeon or dentist. This document is available at our office, and does not require an appointment to obtain it. Simply go to our office during business hours (Monday-Thursday from 8:00 AM to 4:00 PM) (Friday 8:00 AM to 12:00 Noon) or if you have a scheduled appointment with [place], prior to your surgery, and ask for it by name. In addition, you will need to provide [place] with your name, name of your surgeon, type of surgery, and date of procedure or surgery.  *Opioid medications include: morphine, codeine, oxycodone, oxymorphone, hydrocodone, hydromorphone, meperidine, tramadol, tapentadol, buprenorphine, fentanyl, methadone. **Benzodiazepine medications include: diazepam (Valium), alprazolam (Xanax), clonazepam (Klonopine), lorazepam (Ativan), clorazepate (Tranxene), chlordiazepoxide (Librium), estazolam (Prosom), oxazepam (Serax), temazepam (Restoril), triazolam (Halcion)  ____________________________________________________________________________________________  BMI interpretation table: BMI level Category Range association with higher incidence of chronic pain  <18 kg/m2 Underweight   18.5-24.9 kg/m2 Ideal body weight   25-29.9 kg/m2 Overweight Increased incidence by 20%  30-34.9 kg/m2 Obese (Class I) Increased incidence by 68%  35-39.9 kg/m2  Severe obesity (Class II) Increased incidence by 136%  >40 kg/m2 Extreme obesity (Class III) Increased incidence by 254%   BMI Readings from Last 4 Encounters:  12/03/16 38.79 kg/m  09/05/16 38.97 kg/m  09/04/16 38.97 kg/m  06/06/16 38.09 kg/m   Wt Readings from Last 4 Encounters:  12/03/16 219 lb (99.3 kg)  09/05/16 220 lb (99.8 kg)  09/04/16 220 lb (99.8 kg)  06/06/16 215 lb (97.5 kg)

## 2016-12-03 NOTE — Progress Notes (Signed)
Patient's Name: [name]  MRN: [id number]  Referring Provider: Adin Hector, MD  DOB: 08-30-1950  PCP: Adin Hector, MD  DOS: 12/03/2016  Note by: Vevelyn Francois NP  Service setting: Ambulatory outpatient  Specialty: Interventional Pain Management  Location: [facility] (AMB) Pain Management Facility    Patient type: Established    Primary Reason(s) for Visit: Encounter for prescription drug management. (Level of risk: moderate)  CC: Back Pain (low); Knee Pain (bilateral); Hip Pain; and Foot Pain  HPI  [name] is a 66 y.o. year old, female patient, who comes today for a medication management evaluation. She has Obesity; Small bowel obstruction ([place]); Absolute anemia; Aortic heart valve narrowing; Arthritis; B12 deficiency; Benign essential HTN; Chronic kidney disease (CKD), stage III (moderate) ([place]); Focal and segmental hyalinosis; Bleeding gastrointestinal; Acquired atrophy of thyroid; Abnormal presence of protein in urine; Psoriasis; Paroxysmal supraventricular tachycardia (HCC); SOBOE (shortness of breath on exertion); Type 2 diabetes mellitus ([place]); Chronic abdominal pain (Location of Primary Source of Pain) (Bilateral) (L>R); Long term current use of opiate analgesic; Long term prescription opiate use; Opiate use (22.5 MME/Day); Encounter for therapeutic drug level monitoring; Encounter for pain management planning; Neurogenic pain; Musculoskeletal pain; Visceral abdominal pain; Diabetic peripheral neuropathy ([employer]); Chronic low back pain (Location of Secondary source of pain) (Bilateral) (L>R); Chronic lower extremity pain (Location of Tertiary source of pain) (Bilateral) (R>L); Chronic hip pain (Bilateral) (L>R); Chronic knee pain (Bilateral) (L>R); Chronic neck pain (Bilateral) (L>R); H/O colostomy; Diabetic neuropathy ([place]); Recurrent major depressive disorder, in full remission ([place]); Lumbar spondylosis; Lumbar facet syndrome (Bilateral) (L>R); Polyneuropathy; Heart burn; Osteopenia of  multiple sites; FSGS (focal segmental glomerulosclerosis); Chronic pain syndrome; Obesity, Class II, BMI 35-39.9; Hx of adenomatous colonic polyps; and Hyperlipidemia, mixed on her problem list. Her primarily concern today is the Back Pain (low); Knee Pain (bilateral); Hip Pain; and Foot Pain  Pain Assessment: Location: Lower Back Radiating: radiates down both legs to calf on the side Onset: More than a month ago Duration: Chronic pain Quality: Aching Severity: 6 /10 (self-reported pain score)  Note: Reported level is compatible with observation.                   When using our objective Pain Scale, levels between 6 and 10/10 are said to belong in an emergency room, as it progressively worsens from a 6/10, described as severely limiting, requiring emergency care not usually available at an outpatient pain management facility. At a 6/10 level, communication becomes difficult and requires great effort. Assistance to reach the emergency department may be required. Facial flushing and profuse sweating along with potentially dangerous increases in heart rate and blood pressure will be evident. Effect on ADL:   Timing: Constant Modifying factors: medication  [name] was last scheduled for an appointment on 09/05/2016 for medication management. During today's appointment we reviewed [name]'s chronic pain status, as well as her outpatient medication regimen. She admits that the right is greater than the left.  She has some numbness and tingling.   The patient  reports that she does not use drugs. Her body mass index is 38.79 kg/m.  Further details on both, my assessment(s), as well as the proposed treatment plan, please see below.  Controlled Substance Pharmacotherapy Assessment REMS (Risk Evaluation and Mitigation Strategy)  Analgesic:Oxycodone IR 86m every 8hours (116mday) MME/day:22.101m71may   [name]  12/03/2016  1:18 PM  Signed Nursing Pain Medication Assessment:  Safety  precautions to  be maintained throughout the outpatient stay will include: orient to surroundings, keep bed in low position, maintain call bell within reach at all times, provide assistance with transfer out of bed and ambulation.  Medication Inspection Compliance: Pill count conducted under aseptic conditions, in front of the patient. Neither the pills nor the bottle was removed from the patient's sight at any time. Once count was completed pills were immediately returned to the patient in their original bottle.  Medication: Oxycodone IR Pill/Patch Count: 0 of 90 pills remain Pill/Patch Appearance: Markings consistent with prescribed medication Bottle Appearance: Standard pharmacy container. Clearly labeled. Filled Date: 08 /29 / 2018 Last Medication intake:  Yesterday   Called the pharmacy pt has RX on hold, will fill today.   Pharmacokinetics: Liberation and absorption (onset of action): WNL Distribution (time to peak effect): WNL Metabolism and excretion (duration of action): WNL         Pharmacodynamics: Desired effects: Analgesia: [name] reports >50% benefit. Functional ability: Patient reports that medication allows her to accomplish basic ADLs Clinically meaningful improvement in function (CMIF): Sustained CMIF goals met Perceived effectiveness: Described as relatively effective, allowing for increase in activities of daily living (ADL) Undesirable effects: Side-effects or Adverse reactions: None reported Monitoring: [place] PMP: Online review of the past 64-monthperiod conducted. Compliant with practice rules and regulations Last UDS on record: Summary  Date Value Ref Range Status  09/05/2016 FINAL  Final    Comment:    ==================================================================== TOXASSURE SELECT 13 (MW) ==================================================================== Test                             Result       Flag       Units Drug Present and Declared for  Prescription Verification   Oxycodone                      596          EXPECTED   ng/mg creat   Oxymorphone                    485          EXPECTED   ng/mg creat   Noroxycodone                   469          EXPECTED   ng/mg creat   Noroxymorphone                 101          EXPECTED   ng/mg creat    Sources of oxycodone are scheduled prescription medications.    Oxymorphone, noroxycodone, and noroxymorphone are expected    metabolites of oxycodone. Oxymorphone is also available as a    scheduled prescription medication. ==================================================================== Test                      Result    Flag   Units      Ref Range   Creatinine              85               mg/dL      >=20 ==================================================================== Declared Medications:  The flagging and interpretation on this report are based on the  following declared medications.  Unexpected results may arise from  inaccuracies in the declared medications.  **  Note: The testing scope of this panel includes these medications:  Oxycodone  **Note: The testing scope of this panel does not include following  reported medications:  Amlodipine Besylate  Atorvastatin  Calcitriol  Clobetasol  Duloxetine  Famotidine  Glimepiride  Insulin glargine  Levothyroxine  Metoprolol  Nystatin  Telmisartan  Vitamin D2 (Ergocalciferol) ==================================================================== For clinical consultation, please call [phone]. ====================================================================    UDS interpretation: Compliant          Medication Assessment Form: Reviewed. Patient indicates being compliant with therapy Treatment compliance: Compliant Risk Assessment Profile: Aberrant behavior: See prior evaluations. None observed or detected today Comorbid factors increasing risk of overdose: See prior notes. No additional risks detected  today Risk of substance use disorder (SUD): Low     Opioid Risk Tool - 12/03/16 1316      Family History of Substance Abuse   Alcohol Negative   Illegal Drugs Negative   Rx Drugs Negative     Personal History of Substance Abuse   Alcohol Negative   Illegal Drugs Negative   Rx Drugs Negative     Age   Age between 71-45 years  No     History of Preadolescent Sexual Abuse   History of Preadolescent Sexual Abuse Negative or Female     Psychological Disease   Psychological Disease Negative   Depression Negative     Total Score   Opioid Risk Tool Scoring 0   Opioid Risk Interpretation Low Risk     ORT Scoring interpretation table:  Score <3 = Low Risk for SUD  Score between 4-7 = Moderate Risk for SUD  Score >8 = High Risk for Opioid Abuse   Risk Mitigation Strategies:  Patient Counseling: Covered Patient-Prescriber Agreement (PPA): Present and active  Notification to other healthcare providers: [name]  Pharmacologic Plan: No change in therapy, at this time  Laboratory Chemistry  Inflammation Markers (CRP: Acute Phase) (ESR: Chronic Phase) Lab Results  Component Value Date   CRP 0.6 11/07/2015   ESRSEDRATE 16 11/07/2015                 Renal Function Markers Lab Results  Component Value Date   BUN 33 (H) 07/22/2015   CREATININE 2.33 (H) 07/22/2015   GFRAA 24 (L) 07/22/2015   GFRNONAA 21 (L) 07/22/2015                 Hepatic Function Markers Lab Results  Component Value Date   AST 14 (L) 02/22/2015   ALT 13 (L) 02/22/2015   ALBUMIN 2.5 (L) 07/21/2015   ALKPHOS 86 02/22/2015                 Electrolytes Lab Results  Component Value Date   NA 138 07/22/2015   K 4.2 07/22/2015   CL 110 07/22/2015   CALCIUM 7.6 (L) 07/22/2015   MG 2.2 11/07/2015                 Neuropathy Markers Lab Results  Component Value Date   VITAMINB12 273 11/07/2015                 Bone Pathology Markers Lab Results  Component Value Date   ALKPHOS 86 02/22/2015    25OHVITD1 43 11/07/2015   25OHVITD2 38 11/07/2015   25OHVITD3 5.4 11/07/2015   CALCIUM 7.6 (L) 07/22/2015                 Coagulation Parameters Lab Results  Component Value Date  INR 1.28 02/22/2015   LABPROT 16.1 (H) 02/22/2015   APTT 36 02/22/2015   PLT 315 07/22/2015                 Cardiovascular Markers Lab Results  Component Value Date   HGB 8.9 (L) 07/22/2015   HCT 28.0 (L) 07/22/2015                 Note: Lab results reviewed.  Recent Diagnostic Imaging Results  DG Knee 1-2 Views Right CLINICAL DATA:  Anterior knee pain  EXAM: RIGHT KNEE - 1-2 VIEW  COMPARISON:  09/06/2015  FINDINGS: Normal right knee alignment without acute fracture or effusion. Mild osteoarthritis of the medial and lateral compartments with minor joint space loss and bony spurring. Two small ossified loose joint bodies present in the midline posteriorly projecting over the tibial spines on the frontal view.  On the lateral view, there is advanced degenerative arthritis of the patellofemoral joint with joint space loss, sclerosis and bony spurring.  IMPRESSION: Advanced right knee patellofemoral osteoarthritis.  No acute osseous finding, fracture or effusion.  Electronically Signed   By: Jerilynn Mages.  Shick M.D.   On: 09/06/2015 13:54 DG Knee 1-2 Views Left CLINICAL DATA:  History of osteoarthritis, bilateral knee pain anteriorly  EXAM: LEFT KNEE - 1-2 VIEW  COMPARISON:  09/06/2015  FINDINGS: Normal alignment without acute osseous finding, fracture or effusion. Preserved joint spaces. No significant arthropathy. Soft tissue calcifications present along the fibula head. These are nonspecific.  IMPRESSION: No acute osseous finding or significant arthropathy.  Electronically Signed   By: Jerilynn Mages.  Shick M.D.   On: 09/06/2015 13:52 DG HIP UNILAT W OR W/O PELVIS 2-3 VIEWS RIGHT CLINICAL DATA:  Bilateral hip pain, history of arthritis. Hip pain is worse on the right.  EXAM: DG HIP  (WITH OR WITHOUT PELVIS) 2-3V RIGHT  COMPARISON:  09/06/2015  FINDINGS: Bony pelvis and hips appear symmetric and intact. Specifically, the right hip demonstrates normal alignment without acute osseous finding or fracture. No significant arthropathy. Mild sclerosis of the SI joints and symphysis pubis. Lower lumbar facet arthropathy again noted. Benign vascular pelvic calcifications.  IMPRESSION: Osteopenia without acute process.  Electronically Signed   By: Jerilynn Mages.  Shick M.D.   On: 09/06/2015 13:51 DG HIP UNILAT W OR W/O PELVIS 2-3 VIEWS LEFT CLINICAL DATA:  Acute left hip pain, history of arthritis  EXAM: DG HIP (WITH OR WITHOUT PELVIS) 2-3V LEFT  COMPARISON:  09/06/2015  FINDINGS: Bones appear osteopenic. Left hip is intact without fracture or malalignment. No subluxation or dislocation. No significant arthropathy or degenerative process.  IMPRESSION: No acute finding of the left hip.  Electronically Signed   By: Jerilynn Mages.  Shick M.D.   On: 09/06/2015 13:49 DG Lumbar Spine Complete W/Bend CLINICAL DATA:  Chronic lower lumbar back pain  EXAM: LUMBAR SPINE - COMPLETE WITH BENDING VIEWS  COMPARISON:  02/21/2015 CT sagittal reconstructions  FINDINGS: Bones are osteopenic. Advanced facet arthropathy at L3 did S1. There is minimal anterior slippage of L4 upon L5 measuring 5 mm secondary to posterior facet arthropathy. Preserved vertebral body heights and disc spaces. No acute compression fracture, wedge-shaped deformity or focal kyphosis. No instability with flexion and extension. Normal appearing pedicles. Mild bilateral SI joint arthropathy. No pars defects. Large volume of retained stool throughout the colon compatible with constipation. Prior cholecystectomy noted. Aortic atherosclerosis present.  IMPRESSION: Advanced lumbar facet arthropathy from L3-S1 with associated L4 on L5 minimal anterior slippage.  Osteopenia  No acute compression fracture  Aortic  atherosclerosis  Large volume of colonic stool, suggest constipation  Electronically Signed   By: Jerilynn Mages.  Shick M.D.   On: 09/06/2015 13:48 DG Cervical Spine Complete CLINICAL DATA:  Bilateral neck pain, chronic. Previous cervical fusion.  EXAM: CERVICAL SPINE - COMPLETE 4+ VIEW  COMPARISON:  None available  FINDINGS: previous anterior cervical fusion at C4-5 and C5-6. There is solid bony fusion at both levels. Normal alignment. Degenerative changes and spondylosis noted at C3-4 and C6-7 with disc space narrowing, sclerosis and anterior osteophytes. Normal prevertebral soft tissues. Facets are aligned but demonstrate diffuse arthropathy. Foramina are patent. Trachea is midline. Lung apices are clear. Odontoid is intact  IMPRESSION: Previous anterior cervical fusion spanning C4-C6 with solid fusion at both levels.  Degenerative changes and spondylosis at C3-4 and C6-7 as well as diffuse facet arthropathy.  No acute finding by plain radiography  Electronically Signed   By: Jerilynn Mages.  Shick M.D.   On: 09/06/2015 13:46  Complexity Note: Imaging results reviewed. Results shared with Ms. Furuya, using Layman's terms.                         Meds   Current Outpatient Prescriptions:  .  amLODipine (NORVASC) 5 MG tablet, Take 1 tablet (5 mg total) by mouth daily., Disp: 30 tablet, Rfl: 1 .  atorvastatin (LIPITOR) 40 MG tablet, Take 40 mg by mouth daily at 6 PM. , Disp: , Rfl:  .  calcitRIOL (ROCALTROL) 0.25 MCG capsule, Take 1 capsule by mouth every other day., Disp: , Rfl:  .  clobetasol cream (TEMOVATE) 8.29 %, Apply 1 application topically daily., Disp: , Rfl:  .  DULoxetine (CYMBALTA) 30 MG capsule, Take 30 mg by mouth daily. , Disp: , Rfl:  .  Ergocalciferol (VITAMIN D2) 2000 units TABS, Take 1.25 mg by mouth once a week. , Disp: , Rfl:  .  famotidine (PEPCID) 20 MG tablet, Take 1 tablet (20 mg total) by mouth 2 (two) times daily., Disp: 60 tablet, Rfl: 0 .  glimepiride (AMARYL) 4  MG tablet, Take 2 mg by mouth 2 (two) times daily. , Disp: , Rfl: 3 .  glucose blood (ONE TOUCH ULTRA TEST) test strip, TEST TWICE DAILY, Disp: , Rfl:  .  glucose blood (ONE TOUCH ULTRA TEST) test strip, TEST TWICE DAILY, Disp: , Rfl:  .  insulin glargine (LANTUS) 100 UNIT/ML injection, Inject 14 Units into the skin at bedtime. , Disp: , Rfl:  .  insulin lispro (HUMALOG KWIKPEN) 100 UNIT/ML KiwkPen, Take up to 50 units daily in divided doses, as directed., Disp: , Rfl:  .  levothyroxine (SYNTHROID, LEVOTHROID) 88 MCG tablet, TAKE 1 TABLET BY MOUTH ONCE A DAY FOR THYROID, Disp: , Rfl:  .  metoprolol succinate (TOPROL-XL) 25 MG 24 hr tablet, TAKE 1 TABLET BY MOUTH ONCE A DAY, Disp: , Rfl:  .  nystatin (MYCOSTATIN/NYSTOP) powder, Apply topically 4 (four) times daily., Disp: 30 g, Rfl: 0 .  ONETOUCH DELICA LANCETS 93Z MISC, TEST TWICE DAILY, Disp: , Rfl:  .  oxyCODONE (OXY IR/ROXICODONE) 5 MG immediate release tablet, Take 1 tablet (5 mg total) by mouth every 8 (eight) hours as needed for severe pain., Disp: 90 tablet, Rfl: 0 .  telmisartan (MICARDIS) 80 MG tablet, TAKE 1 TABLET BY MOUTH ONCE A DAY, Disp: , Rfl:  .  Vitamin D, Ergocalciferol, (DRISDOL) 50000 units CAPS capsule, Take 50,000 Units by mouth every 7 (seven) days. , Disp: , Rfl:  .  glimepiride (AMARYL) 4 MG tablet, Take by mouth., Disp: , Rfl:  .  oxyCODONE (OXY IR/ROXICODONE) 5 MG immediate release tablet, Take 1 tablet (5 mg total) by mouth every 8 (eight) hours as needed for severe pain., Disp: 90 tablet, Rfl: 0 .  oxyCODONE (OXY IR/ROXICODONE) 5 MG immediate release tablet, Take 1 tablet (5 mg total) by mouth every 8 (eight) hours as needed for severe pain., Disp: 90 tablet, Rfl: 0  ROS  Constitutional: Denies any fever or chills Gastrointestinal: No reported hemesis, hematochezia, vomiting, or acute GI distress Musculoskeletal: Denies any acute onset joint swelling, redness, loss of ROM, or weakness Neurological: No reported  episodes of acute onset apraxia, aphasia, dysarthria, agnosia, amnesia, paralysis, loss of coordination, or loss of consciousness  Allergies  Ms. Estrada is allergic to buspirone; citalopram; lisinopril; metformin; pravastatin; sitagliptin; tramadol; diltiazem hcl; gabapentin; hydralazine; and lovastatin.  [place]  Drug: Ms. Cassity  reports that she does not use drugs. Alcohol:  reports that she does not drink alcohol. Tobacco:  reports that she quit smoking about 3 years ago. Her smoking use included Cigarettes. She has never used smokeless tobacco. Medical:  has a past medical history of Anemia; Anxiety; Aortic valve stenosis; Arthritis; B12 deficiency; Bowel obstruction ([place]); CKD (chronic kidney disease); Colostomy in place [facility]); Diabetes mellitus without complication ([place]); Diverticulitis large intestine; Dysrhythmia; Ectopic atrial tachycardia (HCC); FSGS (focal segmental glomerulosclerosis); Gastritis; GI bleed; Heart murmur; Hyperlipidemia; Hypertension; Hypothyroidism; Hypothyroidism; MRSA (methicillin resistant Staphylococcus aureus); Neuropathy; Neuropathy in diabetes ([place]); Obesity; Pedal edema; Psoriasis; Shortness of breath dyspnea; and Vertigo. Surgical: Ms. Leamer  has a past surgical history that includes Colectomy; laparotomy closure of cecal perforation (05/09/2013); Tonsillectomy; Back surgery; Cardiac catheterization; Abdominal hysterectomy; Debridement of abdominal wall abscess (N/A, 02/22/2015); Excision mass abdominal (N/A, 02/24/2015); Application if wound vac (N/A, 02/24/2015); Excision mass abdominal (N/A, 02/26/2015); Application if wound vac (N/A, 02/26/2015); Wound debridement (N/A, 03/03/2015); Wound debridement (N/A, 03/09/2015); Flexible sigmoidoscopy (06/19/2015); Colostomy reversal (N/A, 07/12/2015); laparotomy (07/12/2015); laparoscopy (07/12/2015); Lysis of adhesion (07/12/2015); laparotomy (N/A, 02/06/2015); and Colonoscopy with propofol (N/A, 09/04/2016). Family: family history includes CAD  in her father; Colon cancer in her brother; Diabetes in her mother; Heart attack in her father; Hypertension in her father.  Constitutional Exam  General appearance: alert, cooperative and morbidly obese Vitals:   12/03/16 1306 12/03/16 1309  BP:  129/64  Pulse: 73 77  Resp: 18   Temp: 98.1 F (36.7 C)   SpO2: 99% 97%  Weight: 219 lb (99.3 kg)   Height: _0  (1.6 m)    BMI Assessment: Estimated body mass index is 38.79 kg/m as calculated from the following:   Height as of this encounter: _1  (1.6 m).   Weight as of this encounter: 219 lb (99.3 kg).  BMI interpPsych/Mental status: Alert, oriented x 3 (person, place, & time)       Eyes: PERLA Respiratory: No evidence of acute respiratory distress  Lumbar Spine Area Exam  Skin & Axial Inspection: No masses, redness, or swelling Alignment: Symmetrical Functional ROM: Unrestricted ROM      Stability: No instability detected Muscle Tone/Strength: Functionally intact. No obvious neuro-muscular anomalies detected. Sensory (Neurological): Unimpaired Palpation: Complains of area being tender to palpation       Provocative Tests: Lumbar Hyperextension and rotation test: evaluation deferred today       Lumbar Lateral bending test: evaluation deferred today       Patrick's Maneuver: evaluation deferred today  Gait & Posture Assessment  Ambulation: Unassisted Gait: Relatively normal for age and body habitus Posture: WNL   Lower Extremity Exam    Side: Right lower extremity  Side: Left lower extremity  Skin & Extremity Inspection: Skin color, temperature, and hair growth are WNL. No peripheral edema or cyanosis. No masses, redness, swelling, asymmetry, or associated skin lesions. No contractures.  Skin & Extremity Inspection: Skin color, temperature, and hair growth are WNL. No peripheral edema or cyanosis. No masses, redness, swelling, asymmetry, or associated skin lesions. No contractures.  Functional ROM:  Unrestricted ROM          Functional ROM: Unrestricted ROM          Muscle Tone/Strength: Functionally intact. No obvious neuro-muscular anomalies detected.  Muscle Tone/Strength: Functionally intact. No obvious neuro-muscular anomalies detected.  Sensory (Neurological): Unimpaired  Sensory (Neurological): Unimpaired  Palpation: No palpable anomalies  Palpation: No palpable anomalies   Assessment  Primary Diagnosis & Pertinent Problem List: The primary encounter diagnosis was Chronic abdominal pain (Location of Primary Source of Pain) (Bilateral) (L>R). Diagnoses of Chronic low back pain (Location of Secondary source of pain) (Bilateral) (L>R), Chronic lower extremity pain (Location of Tertiary source of pain) (Bilateral) (R>L), and Chronic pain syndrome were also pertinent to this visit.  Status Diagnosis  Controlled Controlled Controlled 1. Chronic abdominal pain (Location of Primary Source of Pain) (Bilateral) (L>R)   2. Chronic low back pain (Location of Secondary source of pain) (Bilateral) (L>R)   3. Chronic lower extremity pain (Location of Tertiary source of pain) (Bilateral) (R>L)   4. Chronic pain syndrome     Problems updated and reviewed during this visit: No problems updated. Plan of Care  Pharmacotherapy (Medications Ordered): No orders of the defined types were placed in this encounter.  New Prescriptions   No medications on file   Medications administered today: Ms. Rouser had no medications administered during this visit. Lab-work, procedure(s), and/or referral(s): No orders of the defined types were placed in this encounter.  Imaging and/or referral(s): None  Interventional therapies: Planned, scheduled, and/or pending:  Continue with current regimen. Once abdominal wound heals will consider interventional therapy    Considering:  Diagnostic bilateral lumbar facet block  Possible bilateral lumbar facet rate frequency ablation.  Diagnostic bilateral celiac  plexus block  Diagnostic bilateral intra-articular hip injection  Possible diagnostic bilateral femoral nerve and obturator nerve articular branch blocks  Possible bilateral femoral nerve and obturator nerve articular branch frequency ablation.  Diagnostic bilateral intra-articular knee joint injection  Possible series of 5 bilateral intra-articular [place] knee injections.  Bilateral diagnostic genicular nerve block  Possible bilateral genicular nerve radiofrequency ablation    Palliative PRN treatment(s):  Diagnostic bilateral lumbar facet block  Diagnostic bilateral celiac plexus block  Diagnostic bilateral intra-articular hip injection  Possible diagnostic bilateral femoral nerve and obturator nerve articular branch blocks  Diagnostic bilateral intra-articular knee joint injection Bilateral diagnostic genicular nerve block    Provider-requested follow-up: Return in about 3 months (around 03/05/2017) for MedMgmt.  No future appointments. Primary Care Physician: Adin Hector, MD Location: [facility] Note by: Vevelyn Francois NP Date: 12/03/2016; Time: 2:00 PM  Pain Score Disclaimer: We use the NRS-11 scale. This is a self-reported, subjective measurement of pain severity with only modest accuracy. It is used primarily to identify changes within a particular patient. It must be understood that outpatient pain scales are significantly less accurate that those used for research, where they can be applied  under ideal controlled circumstances with minimal exposure to variables. In reality, the score is likely to be a combination of pain intensity and pain affect, where pain affect describes the degree of emotional arousal or changes in action readiness caused by the sensory experience of pain. Factors such as social and work situation, setting, emotional state, anxiety levels, expectation, and prior pain experience may influence pain perception and show large  inter-individual differences that may also be affected by time variables.  Patient instructions provided during this appointment: Patient Instructions   ____________________________________________________________________________________________  Medication Rules  Applies to: All patients receiving prescriptions (written or electronic).  Pharmacy of record: Pharmacy where electronic prescriptions will be sent. If written prescriptions are taken to a different pharmacy, please inform the nursing staff. The pharmacy listed in the electronic medical record should be the one where you would like electronic prescriptions to be sent.  Prescription refills: Only during scheduled appointments. Applies to both, written and electronic prescriptions.  NOTE: The following applies primarily to controlled substances (Opioid* Pain Medications).   Patient's responsibilities: 1. Pain Pills: Bring all pain pills to every appointment (except for procedure appointments). 2. Pill Bottles: Bring pills in original pharmacy bottle. Always bring newest bottle. Bring bottle, even if empty. 3. Medication refills: You are responsible for knowing and keeping track of what medications you need refilled. The day before your appointment, write a list of all prescriptions that need to be refilled. Bring that list to your appointment and give it to the admitting nurse. Prescriptions will be written only during appointments. If you forget a medication, it will not be "Called in", "Faxed", or "electronically sent". You will need to get another appointment to get these prescribed. 4. Prescription Accuracy: You are responsible for carefully inspecting your prescriptions before leaving our office. Have the discharge nurse carefully go over each prescription with you, before taking them home. Make sure that your name is accurately spelled, that your address is correct. Check the name and dose of your medication to make sure it is  accurate. Check the number of pills, and the written instructions to make sure they are clear and accurate. Make sure that you are given enough medication to last until your next medication refill appointment. 5. Taking Medication: Take medication as prescribed. Never take more pills than instructed. Never take medication more frequently than prescribed. Taking less pills or less frequently is permitted and encouraged, when it comes to controlled substances (written prescriptions).  6. Inform other Doctors: Always inform, all of your healthcare providers, of all the medications you take. 7. Pain Medication from other Providers: You are not allowed to accept any additional pain medication from any other Doctor or Healthcare provider. There are two exceptions to this rule. (see below) In the event that you require additional pain medication, you are responsible for notifying us, as stated below. 8. Medication Agreement: You are responsible for carefully reading and following our Medication Agreement. This must be signed before receiving any prescriptions from our practice. Safely store a copy of your signed Agreement. Violations to the Agreement will result in no further prescriptions. (Additional copies of our Medication Agreement are available upon request.) 9. Laws, Rules, & Regulations: All patients are expected to follow all Federal and [employer], [employer], Rules, [employer]. Ignorance of the Laws does not constitute a valid excuse. The use of any illegal substances is prohibited. 10. Adopted CDC guidelines & recommendations: Target dosing levels will be at or below 60 MME/day. Use of benzodiazepines**  is not recommended.  Exceptions: There are only two exceptions to the rule of not receiving pain medications from other Healthcare Providers. 1. Exception #1 (Emergencies): In the event of an emergency (i.e.: accident requiring emergency care), you are allowed to receive additional pain medication.  However, you are responsible for: As soon as you are able, call our office [phone], at any time of the day or night, and leave a message stating your name, the date and nature of the emergency, and the name and dose of the medication prescribed. In the event that your call is answered by a member of our staff, make sure to document and save the date, time, and the name of the person that took your information.  2. Exception #2 (Planned Surgery): In the event that you are scheduled by another doctor or dentist to have any type of surgery or procedure, you are allowed (for a period no longer than 30 days), to receive additional pain medication, for the acute post-op pain. However, in this case, you are responsible for picking up a copy of our "Post-op Pain Management for Surgeons" handout, and giving it to your surgeon or dentist. This document is available at our office, and does not require an appointment to obtain it. Simply go to our office during business hours (Monday-Thursday from 8:00 AM to 4:00 PM) (Friday 8:00 AM to 12:00 Noon) or if you have a scheduled appointment with [place], prior to your surgery, and ask for it by name. In addition, you will need to provide [place] with your name, name of your surgeon, type of surgery, and date of procedure or surgery.  *Opioid medications include: morphine, codeine, oxycodone, oxymorphone, hydrocodone, hydromorphone, meperidine, tramadol, tapentadol, buprenorphine, fentanyl, methadone. **Benzodiazepine medications include: diazepam (Valium), alprazolam (Xanax), clonazepam (Klonopine), lorazepam (Ativan), clorazepate (Tranxene), chlordiazepoxide (Librium), estazolam (Prosom), oxazepam (Serax), temazepam (Restoril), triazolam (Halcion)  ____________________________________________________________________________________________

## 2016-12-03 NOTE — Progress Notes (Signed)
Nursing Pain Medication Assessment:  Safety precautions to be maintained throughout the outpatient stay will include: orient to surroundings, keep bed in low position, maintain call bell within reach at all times, provide assistance with transfer out of bed and ambulation.  Medication Inspection Compliance: Pill count conducted under aseptic conditions, in front of the patient. Neither the pills nor the bottle was removed from the patient's sight at any time. Once count was completed pills were immediately returned to the patient in their original bottle.  Medication: Oxycodone IR Pill/Patch Count: 0 of 90 pills remain Pill/Patch Appearance: Markings consistent with prescribed medication Bottle Appearance: Standard pharmacy container. Clearly labeled. Filled Date: 08 /29 / 2018 Last Medication intake:  [name]

## 2017-02-07 DIAGNOSIS — Z794 Long term (current) use of insulin: Secondary | ICD-10-CM

## 2017-02-07 DIAGNOSIS — E1165 Type 2 diabetes mellitus with hyperglycemia: Secondary | ICD-10-CM | POA: Insufficient documentation

## 2017-03-05 ENCOUNTER — Encounter: Payer: Self-pay | Admitting: Nurse Practitioner

## 2017-03-05 ENCOUNTER — Ambulatory Visit: Payer: Medicare Other | Attending: Nurse Practitioner | Admitting: Nurse Practitioner

## 2017-03-05 VITALS — BP 118/101 | HR 79 | Temp 98.0°F | Resp 16 | Ht 63.0 in | Wt 220.0 lb

## 2017-03-05 DIAGNOSIS — M8589 Other specified disorders of bone density and structure, multiple sites: Secondary | ICD-10-CM | POA: Insufficient documentation

## 2017-03-05 DIAGNOSIS — M25551 Pain in right hip: Secondary | ICD-10-CM

## 2017-03-05 DIAGNOSIS — M25552 Pain in left hip: Secondary | ICD-10-CM | POA: Diagnosis not present

## 2017-03-05 DIAGNOSIS — Z86010 Personal history of colonic polyps: Secondary | ICD-10-CM | POA: Insufficient documentation

## 2017-03-05 DIAGNOSIS — Z833 Family history of diabetes mellitus: Secondary | ICD-10-CM | POA: Diagnosis not present

## 2017-03-05 DIAGNOSIS — Z800 Family history of malignant neoplasm of digestive organs: Secondary | ICD-10-CM | POA: Diagnosis not present

## 2017-03-05 DIAGNOSIS — Z8249 Family history of ischemic heart disease and other diseases of the circulatory system: Secondary | ICD-10-CM | POA: Diagnosis not present

## 2017-03-05 DIAGNOSIS — Z5181 Encounter for therapeutic drug level monitoring: Secondary | ICD-10-CM | POA: Diagnosis not present

## 2017-03-05 DIAGNOSIS — Z87891 Personal history of nicotine dependence: Secondary | ICD-10-CM | POA: Diagnosis not present

## 2017-03-05 DIAGNOSIS — R109 Unspecified abdominal pain: Secondary | ICD-10-CM

## 2017-03-05 DIAGNOSIS — E669 Obesity, unspecified: Secondary | ICD-10-CM | POA: Diagnosis not present

## 2017-03-05 DIAGNOSIS — E039 Hypothyroidism, unspecified: Secondary | ICD-10-CM | POA: Insufficient documentation

## 2017-03-05 DIAGNOSIS — M545 Low back pain: Secondary | ICD-10-CM | POA: Diagnosis present

## 2017-03-05 DIAGNOSIS — E538 Deficiency of other specified B group vitamins: Secondary | ICD-10-CM | POA: Diagnosis not present

## 2017-03-05 DIAGNOSIS — Z888 Allergy status to other drugs, medicaments and biological substances status: Secondary | ICD-10-CM | POA: Diagnosis not present

## 2017-03-05 DIAGNOSIS — Z6838 Body mass index (BMI) 38.0-38.9, adult: Secondary | ICD-10-CM | POA: Insufficient documentation

## 2017-03-05 DIAGNOSIS — Z9049 Acquired absence of other specified parts of digestive tract: Secondary | ICD-10-CM | POA: Diagnosis not present

## 2017-03-05 DIAGNOSIS — M5441 Lumbago with sciatica, right side: Secondary | ICD-10-CM | POA: Diagnosis not present

## 2017-03-05 DIAGNOSIS — Z79890 Hormone replacement therapy (postmenopausal): Secondary | ICD-10-CM | POA: Insufficient documentation

## 2017-03-05 DIAGNOSIS — M25561 Pain in right knee: Secondary | ICD-10-CM | POA: Diagnosis not present

## 2017-03-05 DIAGNOSIS — M488X6 Other specified spondylopathies, lumbar region: Secondary | ICD-10-CM | POA: Insufficient documentation

## 2017-03-05 DIAGNOSIS — F419 Anxiety disorder, unspecified: Secondary | ICD-10-CM | POA: Diagnosis not present

## 2017-03-05 DIAGNOSIS — M25562 Pain in left knee: Secondary | ICD-10-CM | POA: Diagnosis not present

## 2017-03-05 DIAGNOSIS — Z79891 Long term (current) use of opiate analgesic: Secondary | ICD-10-CM | POA: Insufficient documentation

## 2017-03-05 DIAGNOSIS — N183 Chronic kidney disease, stage 3 (moderate): Secondary | ICD-10-CM | POA: Insufficient documentation

## 2017-03-05 DIAGNOSIS — Z794 Long term (current) use of insulin: Secondary | ICD-10-CM | POA: Insufficient documentation

## 2017-03-05 DIAGNOSIS — E782 Mixed hyperlipidemia: Secondary | ICD-10-CM | POA: Insufficient documentation

## 2017-03-05 DIAGNOSIS — I129 Hypertensive chronic kidney disease with stage 1 through stage 4 chronic kidney disease, or unspecified chronic kidney disease: Secondary | ICD-10-CM | POA: Insufficient documentation

## 2017-03-05 DIAGNOSIS — G894 Chronic pain syndrome: Secondary | ICD-10-CM | POA: Diagnosis not present

## 2017-03-05 DIAGNOSIS — Z79899 Other long term (current) drug therapy: Secondary | ICD-10-CM | POA: Insufficient documentation

## 2017-03-05 DIAGNOSIS — M5442 Lumbago with sciatica, left side: Secondary | ICD-10-CM

## 2017-03-05 DIAGNOSIS — E1122 Type 2 diabetes mellitus with diabetic chronic kidney disease: Secondary | ICD-10-CM | POA: Insufficient documentation

## 2017-03-05 DIAGNOSIS — Z981 Arthrodesis status: Secondary | ICD-10-CM | POA: Insufficient documentation

## 2017-03-05 DIAGNOSIS — F3342 Major depressive disorder, recurrent, in full remission: Secondary | ICD-10-CM | POA: Insufficient documentation

## 2017-03-05 DIAGNOSIS — E1142 Type 2 diabetes mellitus with diabetic polyneuropathy: Secondary | ICD-10-CM | POA: Insufficient documentation

## 2017-03-05 DIAGNOSIS — L409 Psoriasis, unspecified: Secondary | ICD-10-CM | POA: Insufficient documentation

## 2017-03-05 DIAGNOSIS — G8929 Other chronic pain: Secondary | ICD-10-CM

## 2017-03-05 DIAGNOSIS — I350 Nonrheumatic aortic (valve) stenosis: Secondary | ICD-10-CM | POA: Insufficient documentation

## 2017-03-05 DIAGNOSIS — M47816 Spondylosis without myelopathy or radiculopathy, lumbar region: Secondary | ICD-10-CM | POA: Insufficient documentation

## 2017-03-05 MED ORDER — OXYCODONE HCL 5 MG PO TABS: 5.0000 mg | ORAL_TABLET | Freq: Three times a day (TID) | ORAL | 0 refills | Status: DC | PRN

## 2017-03-05 NOTE — Patient Instructions (Addendum)
____________________________________________________________________________________________  Medication Rules  Applies to: All patients receiving prescriptions (written or electronic).  Pharmacy of record: Pharmacy where electronic prescriptions will be sent. If written prescriptions are taken to a different pharmacy, please inform the nursing staff. The pharmacy listed in the electronic medical record should be the one where you would like electronic prescriptions to be sent.  Prescription refills: Only during scheduled appointments. Applies to both, written and electronic prescriptions.  NOTE: The following applies primarily to controlled substances (Opioid* Pain Medications).   Patient's responsibilities: 1. Pain Pills: Bring all pain pills to every appointment (except for procedure appointments). 2. Pill Bottles: Bring pills in original pharmacy bottle. Always bring newest bottle. Bring bottle, even if empty. 3. Medication refills: You are responsible for knowing and keeping track of what medications you need refilled. The day before your appointment, write a list of all prescriptions that need to be refilled. Bring that list to your appointment and give it to the admitting nurse. Prescriptions will be written only during appointments. If you forget a medication, it will not be "Called in", "Faxed", or "electronically sent". You will need to get another appointment to get these prescribed. 4. Prescription Accuracy: You are responsible for carefully inspecting your prescriptions before leaving our office. Have the discharge nurse carefully go over each prescription with you, before taking them home. Make sure that your name is accurately spelled, that your address is correct. Check the name and dose of your medication to make sure it is accurate. Check the number of pills, and the written instructions to make sure they are clear and accurate. Make sure that you are given enough medication to  last until your next medication refill appointment. 5. Taking Medication: Take medication as prescribed. Never take more pills than instructed. Never take medication more frequently than prescribed. Taking less pills or less frequently is permitted and encouraged, when it comes to controlled substances (written prescriptions).  6. Inform other Doctors: Always inform, all of your healthcare providers, of all the medications you take. 7. Pain Medication from other Providers: You are not allowed to accept any additional pain medication from any other Doctor or Healthcare provider. There are two exceptions to this rule. (see below) In the event that you require additional pain medication, you are responsible for notifying us, as stated below. 8. Medication Agreement: You are responsible for carefully reading and following our Medication Agreement. This must be signed before receiving any prescriptions from our practice. Safely store a copy of your signed Agreement. Violations to the Agreement will result in no further prescriptions. (Additional copies of our Medication Agreement are available upon request.) 9. Laws, Rules, & Regulations: All patients are expected to follow all Federal and State Laws, Statutes, Rules, & Regulations. Ignorance of the Laws does not constitute a valid excuse. The use of any illegal substances is prohibited. 10. Adopted CDC guidelines & recommendations: Target dosing levels will be at or below 60 MME/day. Use of benzodiazepines** is not recommended.  Exceptions: There are only two exceptions to the rule of not receiving pain medications from other Healthcare Providers. 1. Exception #1 (Emergencies): In the event of an emergency (i.e.: accident requiring emergency care), you are allowed to receive additional pain medication. However, you are responsible for: As soon as you are able, call our office [phone], at any time of the day or night, and leave a message stating your  name, the date and nature of the emergency, and the name and dose of the medication   prescribed. In the event that your call is answered by a member of our staff, make sure to document and save the date, time, and the name of the person that took your information.  2. Exception #2 (Planned Surgery): In the event that you are scheduled by another doctor or dentist to have any type of surgery or procedure, you are allowed (for a period no longer than 30 days), to receive additional pain medication, for the acute post-op pain. However, in this case, you are responsible for picking up a copy of our "Post-op Pain Management for Surgeons" handout, and giving it to your surgeon or dentist. This document is available at our office, and does not require an appointment to obtain it. Simply go to our office during business hours (Monday-Thursday from 8:00 AM to 4:00 PM) (Friday 8:00 AM to 12:00 Noon) or if you have a scheduled appointment with us, prior to your surgery, and ask for it by name. In addition, you will need to provide us with your name, name of your surgeon, type of surgery, and date of procedure or surgery.  *Opioid medications include: morphine, codeine, oxycodone, oxymorphone, hydrocodone, hydromorphone, meperidine, tramadol, tapentadol, buprenorphine, fentanyl, methadone. **Benzodiazepine medications include: diazepam (Valium), alprazolam (Xanax), clonazepam (Klonopine), lorazepam (Ativan), clorazepate (Tranxene), chlordiazepoxide (Librium), estazolam (Prosom), oxazepam (Serax), temazepam (Restoril), triazolam (Halcion)  ____________________________________________________________________________________________  ____________________________________________________________________________________________  Pain Scale  Introduction: The pain score used by this practice is the Verbal Numerical Rating Scale (VNRS-11). This is an 11-point scale. It is for adults and children 10 years or older. There are  significant differences in how the pain score is reported, used, and applied. Forget everything you learned in the past and learn this scoring system.  General Information: The scale should reflect your current level of pain. Unless you are specifically asked for the level of your worst pain, or your average pain. If you are asked for one of these two, then it should be understood that it is over the past 24 hours.  Basic Activities of Daily Living (ADL): Personal hygiene, dressing, eating, transferring, and using restroom.  Instructions: Most patients tend to report their level of pain as a combination of two factors, their physical pain and their psychosocial pain. This last one is also known as "suffering" and it is reflection of how physical pain affects you socially and psychologically. From now on, report them separately. From this point on, when asked to report your pain level, report only your physical pain. Use the following table for reference.  Pain Clinic Pain Levels (0-5/10)  Pain Level Score  Description  No Pain 0   Mild pain 1 Nagging, annoying, but does not interfere with basic activities of daily living (ADL). Patients are able to eat, bathe, get dressed, toileting (being able to get on and off the toilet and perform personal hygiene functions), transfer (move in and out of bed or a chair without assistance), and maintain continence (able to control bladder and bowel functions). Blood pressure and heart rate are unaffected. A normal heart rate for a healthy adult ranges from 60 to 100 bpm (beats per minute).   Mild to moderate pain 2 Noticeable and distracting. Impossible to hide from other people. More frequent flare-ups. Still possible to adapt and function close to normal. It can be very annoying and may have occasional stronger flare-ups. With discipline, patients may get used to it and adapt.   Moderate pain 3 Interferes significantly with activities of daily living (ADL). It  becomes difficult   to feed, bathe, get dressed, get on and off the toilet or to perform personal hygiene functions. Difficult to get in and out of bed or a chair without assistance. Very distracting. With effort, it can be ignored when deeply involved in activities.   Moderately severe pain 4 Impossible to ignore for more than a few minutes. With effort, patients may still be able to manage work or participate in some social activities. Very difficult to concentrate. Signs of autonomic nervous system discharge are evident: dilated pupils (mydriasis); mild sweating (diaphoresis); sleep interference. Heart rate becomes elevated (>115 bpm). Diastolic blood pressure (lower number) rises above 100 mmHg. Patients find relief in laying down and not moving.   Severe pain 5 Intense and extremely unpleasant. Associated with frowning face and frequent crying. Pain overwhelms the senses.  Ability to do any activity or maintain social relationships becomes significantly limited. Conversation becomes difficult. Pacing back and forth is common, as getting into a comfortable position is nearly impossible. Pain wakes you up from deep sleep. Physical signs will be obvious: pupillary dilation; increased sweating; goosebumps; brisk reflexes; cold, clammy hands and feet; nausea, vomiting or dry heaves; loss of appetite; significant sleep disturbance with inability to fall asleep or to remain asleep. When persistent, significant weight loss is observed due to the complete loss of appetite and sleep deprivation.  Blood pressure and heart rate becomes significantly elevated. Caution: If elevated blood pressure triggers a pounding headache associated with blurred vision, then the patient should immediately seek attention at an urgent or emergency care unit, as these may be signs of an impending stroke.    Emergency Department Pain Levels (6-10/10)  Emergency Room Pain 6 Severely limiting. Requires emergency care and should not be  seen or managed at an outpatient pain management facility. Communication becomes difficult and requires great effort. Assistance to reach the emergency department may be required. Facial flushing and profuse sweating along with potentially dangerous increases in heart rate and blood pressure will be evident.   Distressing pain 7 Self-care is very difficult. Assistance is required to transport, or use restroom. Assistance to reach the emergency department will be required. Tasks requiring coordination, such as bathing and getting dressed become very difficult.   Disabling pain 8 Self-care is no longer possible. At this level, pain is disabling. The individual is unable to do even the most "basic" activities such as walking, eating, bathing, dressing, transferring to a bed, or toileting. Fine motor skills are lost. It is difficult to think clearly.   Incapacitating pain 9 Pain becomes incapacitating. Thought processing is no longer possible. Difficult to remember your own name. Control of movement and coordination are lost.   The worst pain imaginable 10 At this level, most patients pass out from pain. When this level is reached, collapse of the autonomic nervous system occurs, leading to a sudden drop in blood pressure and heart rate. This in turn results in a temporary and dramatic drop in blood flow to the brain, leading to a loss of consciousness. Fainting is one of the body's self defense mechanisms. Passing out puts the brain in a calmed state and causes it to shut down for a while, in order to begin the healing process.    Summary: 1. Refer to this scale when providing us with your pain level. 2. Be accurate and careful when reporting your pain level. This will help with your care. 3. Over-reporting your pain level will lead to loss of credibility. 4. Even a level of   1/10 means that there is pain and will be treated at our facility. 5. High, inaccurate reporting will be documented as "Symptom  Exaggeration", leading to loss of credibility and suspicions of possible secondary gains such as obtaining more narcotics, or wanting to appear disabled, for fraudulent reasons. 6. Only pain levels of 5 or below will be seen at our facility. 7. Pain levels of 6 and above will be sent to the Emergency Department and the appointment cancelled. ____________________________________________________________________________________________    BMI Assessment: Estimated body mass index is 38.97 kg/m as calculated from the following:   Height as of this encounter: 5\' 3"  (1.6 m).   Weight as of this encounter: 220 lb (99.8 kg).  BMI interpretation table: BMI level Category Range association with higher incidence of chronic pain  <18 kg/m2 Underweight   18.5-24.9 kg/m2 Ideal body weight   25-29.9 kg/m2 Overweight Increased incidence by 20%  30-34.9 kg/m2 Obese (Class I) Increased incidence by 68%  35-39.9 kg/m2 Severe obesity (Class II) Increased incidence by 136%  >40 kg/m2 Extreme obesity (Class III) Increased incidence by 254%   BMI Readings from Last 4 Encounters:  03/05/17 38.97 kg/m  12/03/16 38.79 kg/m  09/05/16 38.97 kg/m  09/04/16 38.97 kg/m   Wt Readings from Last 4 Encounters:  03/05/17 220 lb (99.8 kg)  12/03/16 219 lb (99.3 kg)  09/05/16 220 lb (99.8 kg)  09/04/16 220 lb (99.8 kg)

## 2017-03-05 NOTE — Progress Notes (Signed)
Nursing Pain Medication Assessment:  Safety precautions to be maintained throughout the outpatient stay will include: orient to surroundings, keep bed in low position, maintain call bell within reach at all times, provide assistance with transfer out of bed and ambulation.  Medication Inspection Compliance: Pill count conducted under aseptic conditions, in front of the patient. Neither the pills nor the bottle was removed from the patient's sight at any time. Once count was completed pills were immediately returned to the patient in their original bottle.  Medication: Oxycodone IR Pill/Patch Count: 88 of 90 pills remain Pill/Patch Appearance: Markings consistent with prescribed medication Bottle Appearance: Standard pharmacy container. Clearly labeled. Filled Date: 01 / 14 / 2019 Last Medication intake:  Yesterday

## 2017-03-05 NOTE — Progress Notes (Signed)
Patient's Name: [name]  MRN: [id number]  Referring Provider: Adin Hector, MD  DOB: 10/29/1950  PCP: Adin Hector, MD  DOS: 03/05/2017  Note by: Vevelyn Francois NP  Service setting: Ambulatory outpatient  Specialty: Interventional Pain Management  Location: [facility] (AMB) Pain Management Facility    Patient type: Established    Primary Reason(s) for Visit: Encounter for prescription drug management. (Level of risk: moderate)  CC: Back Pain (lower bilateral); Hip Pain (right is worse); and Knee Pain (bilateral)  HPI  [name] is a 67 y.o. year old, female patient, who comes today for a medication management evaluation. She has Obesity; Small bowel obstruction ([place]); Absolute anemia; Aortic heart valve narrowing; Arthritis; B12 deficiency; Benign essential HTN; Chronic kidney disease (CKD), stage III (moderate) ([place]); Focal and segmental hyalinosis; Bleeding gastrointestinal; Acquired atrophy of thyroid; Abnormal presence of protein in urine; Psoriasis; Paroxysmal supraventricular tachycardia (HCC); SOBOE (shortness of breath on exertion); Type 2 diabetes mellitus ([place]); Chronic abdominal pain (Location of Primary Source of Pain) (Bilateral) (L>R); Long term current use of opiate analgesic; Long term prescription opiate use; Opiate use (22.5 MME/Day); Encounter for therapeutic drug level monitoring; Encounter for pain management planning; Neurogenic pain; Musculoskeletal pain; Visceral abdominal pain; Diabetic peripheral neuropathy ([place]); Chronic low back pain (Location of Secondary source of pain) (Bilateral) (L>R); Chronic lower extremity pain (Location of Tertiary source of pain) (Bilateral) (R>L); Chronic hip pain (Bilateral) (L>R); Chronic knee pain (Bilateral) (L>R); Chronic neck pain (Bilateral) (L>R); H/O colostomy; Diabetic neuropathy ([place]); Recurrent major depressive disorder, in full remission ([place]); Lumbar spondylosis; Lumbar facet syndrome (Bilateral) (L>R); Polyneuropathy; Heart  burn; Osteopenia of multiple sites; FSGS (focal segmental glomerulosclerosis); Chronic pain syndrome; Obesity, Class II, BMI 35-39.9; Hx of adenomatous colonic polyps; and Hyperlipidemia, mixed on their problem list. Her primarily concern today is the Back Pain (lower bilateral); Hip Pain (right is worse); and Knee Pain (bilateral)  Pain Assessment: Location: Lower, Left, Right Back(hip and knees) Radiating: into hips and leg Onset: More than a month ago Duration: Chronic pain Quality: Aching, Discomfort, Constant Severity: 6 /10 (self-reported pain score)  Note: Reported level is compatible with observation. Clinically the patient looks like a 3/10 A 3/10 is viewed as "Moderate" and described as significantly interfering with activities of daily living (ADL). It becomes difficult to feed, bathe, get dressed, get on and off the toilet or to perform personal hygiene functions. Difficult to get in and out of bed or a chair without assistance. Very distracting. With effort, it can be ignored when deeply involved in activities. Information on the proper use of the pain scale provided to the patient today. When using our objective Pain Scale, levels between 6 and 10/10 are said to belong in an emergency room, as it progressively worsens from a 6/10, described as severely limiting, requiring emergency care not usually available at an outpatient pain management facility. At a 6/10 level, communication becomes difficult and requires great effort. Assistance to reach the emergency department may be required. Facial flushing and profuse sweating along with potentially dangerous increases in heart rate and blood pressure will be evident. Effect on ADL: difficult to get around and walking is hard Timing: Constant Modifying factors: medications  [name] was last scheduled for an appointment on 12/03/2016 for medication management. During today's appointment we reviewed [name]'s chronic pain status, as well as  her outpatient medication regimen.  The patient  reports that she does not use drugs. Her body mass index is  38.97 kg/m.  Further details on both, my assessment(s), as well as the proposed treatment plan, please see below.  Controlled Substance Pharmacotherapy Assessment REMS (Risk Evaluation and Mitigation Strategy)  Analgesic:Oxycodone IR 32m every 8hours (133mday) MME/day:22.22m66may     PatJanett BillowN  03/05/2017  2:06 PM  Sign at close encounter Nursing Pain Medication Assessment:  Safety precautions to be maintained throughout the outpatient stay will include: orient to surroundings, keep bed in low position, maintain call bell within reach at all times, provide assistance with transfer out of bed and ambulation.  Medication Inspection Compliance: Pill count conducted under aseptic conditions, in front of the patient. Neither the pills nor the bottle was removed from the patient's sight at any time. Once count was completed pills were immediately returned to the patient in their original bottle.  Medication: Oxycodone IR Pill/Patch Count: 88 of 90 pills remain Pill/Patch Appearance: Markings consistent with prescribed medication Bottle Appearance: Standard pharmacy container. Clearly labeled. Filled Date: 01 / 14 / 2019 Last Medication intake:  Yesterday   Pharmacokinetics: Liberation and absorption (onset of action): WNL Distribution (time to peak effect): WNL Metabolism and excretion (duration of action): WNL         Pharmacodynamics: Desired effects: Analgesia: [name] >50% benefit. Functional ability: Patient reports that medication allows her to accomplish basic ADLs Clinically meaningful improvement in function (CMIF): Sustained CMIF goals met Perceived effectiveness: Described as relatively effective, allowing for increase in activities of daily living (ADL) Undesirable effects: Side-effects or Adverse reactions: None  reported Monitoring: [place] PMP: Online review of the past 12-57-monthiod conducted. Compliant with practice rules and regulations Last UDS on record: Summary  Date Value Ref Range Status  09/05/2016 FINAL  Final    Comment:    ==================================================================== TOXASSURE SELECT 13 (MW) ==================================================================== Test                             Result       Flag       Units Drug Present and Declared for Prescription Verification   Oxycodone                      596          EXPECTED   ng/mg creat   Oxymorphone                    485          EXPECTED   ng/mg creat   Noroxycodone                   469          EXPECTED   ng/mg creat   Noroxymorphone                 101          EXPECTED   ng/mg creat    Sources of oxycodone are scheduled prescription medications.    Oxymorphone, noroxycodone, and noroxymorphone are expected    metabolites of oxycodone. Oxymorphone is also available as a    scheduled prescription medication. ==================================================================== Test                      Result    Flag   Units      Ref Range   Creatinine              85  mg/dL      >=20 ==================================================================== Declared Medications:  The flagging and interpretation on this report are based on the  following declared medications.  Unexpected results may arise from  inaccuracies in the declared medications.  **Note: The testing scope of this panel includes these medications:  Oxycodone  **Note: The testing scope of this panel does not include following  reported medications:  Amlodipine Besylate  Atorvastatin  Calcitriol  Clobetasol  Duloxetine  Famotidine  Glimepiride  Insulin glargine  Levothyroxine  Metoprolol  Nystatin  Telmisartan  Vitamin D2  (Ergocalciferol) ==================================================================== For clinical consultation, please call [phone]. ====================================================================    UDS interpretation: Compliant          Medication Assessment Form: Reviewed. Patient indicates being compliant with therapy Treatment compliance: Compliant Risk Assessment Profile: Aberrant behavior: See prior evaluations. None observed or detected today Comorbid factors increasing risk of overdose: See prior notes. No additional risks detected today Risk of substance use disorder (SUD): Low Opioid Risk Tool - 03/05/17 1402      Family History of Substance Abuse   Alcohol  Negative    Illegal Drugs  Negative    Rx Drugs  Negative      Personal History of Substance Abuse   Alcohol  Negative    Illegal Drugs  Negative    Rx Drugs  Negative      Psychological Disease   Psychological Disease  Negative    Depression  Negative      Total Score   Opioid Risk Tool Scoring  0    Opioid Risk Interpretation  Low Risk      ORT Scoring interpretation table:  Score <3 = Low Risk for SUD  Score between 4-7 = Moderate Risk for SUD  Score >8 = High Risk for Opioid Abuse   Risk Mitigation Strategies:  Patient Counseling: Covered Patient-Prescriber Agreement (PPA): Present and active  Notification to other healthcare providers: Done  Pharmacologic Plan: No change in therapy, at this time.             Laboratory Chemistry  Inflammation Markers (CRP: Acute Phase) (ESR: Chronic Phase) Lab Results  Component Value Date   CRP 0.6 11/07/2015   ESRSEDRATE 16 11/07/2015   LATICACIDVEN 0.8 02/21/2015                 Rheumatology Markers Lab Results  Component Value Date   LABURIC 7.6 (H) 07/14/2015                Renal Function Markers Lab Results  Component Value Date   BUN 33 (H) 07/22/2015   CREATININE 2.33 (H) 07/22/2015   GFRAA 24 (L) 07/22/2015   GFRNONAA 21 (L)  07/22/2015                 Hepatic Function Markers Lab Results  Component Value Date   AST 14 (L) 02/22/2015   ALT 13 (L) 02/22/2015   ALBUMIN 2.5 (L) 07/21/2015   ALKPHOS 86 02/22/2015   LIPASE 21 02/04/2015                 Electrolytes Lab Results  Component Value Date   NA 138 07/22/2015   K 4.2 07/22/2015   CL 110 07/22/2015   CALCIUM 7.6 (L) 07/22/2015   MG 2.2 11/07/2015   PHOS 4.2 07/21/2015                 Neuropathy Markers Lab Results  Component Value Date   VITAMINB12 273   11/07/2015   HGBA1C 9.2 (H) 02/04/2015                 Bone Pathology Markers Lab Results  Component Value Date   25OHVITD1 43 11/07/2015   25OHVITD2 38 11/07/2015   25OHVITD3 5.4 11/07/2015                 Coagulation Parameters Lab Results  Component Value Date   INR 1.28 02/22/2015   LABPROT 16.1 (H) 02/22/2015   APTT 36 02/22/2015   PLT 315 07/22/2015                 Cardiovascular Markers Lab Results  Component Value Date   TROPONINI 0.08 (H) 07/19/2015   HGB 8.9 (L) 07/22/2015   HCT 28.0 (L) 07/22/2015                 CA Markers No results found for: CEA, CA125, LABCA2               Note: Lab results reviewed.  Recent Diagnostic Imaging Results  DG Knee 1-2 Views Right CLINICAL DATA:  Anterior knee pain  EXAM: RIGHT KNEE - 1-2 VIEW  COMPARISON:  09/06/2015  FINDINGS: Normal right knee alignment without acute fracture or effusion. Mild osteoarthritis of the medial and lateral compartments with minor joint space loss and bony spurring. Two small ossified loose joint bodies present in the midline posteriorly projecting over the tibial spines on the frontal view.  On the lateral view, there is advanced degenerative arthritis of the patellofemoral joint with joint space loss, sclerosis and bony spurring.  IMPRESSION: Advanced right knee patellofemoral osteoarthritis.  No acute osseous finding, fracture or effusion.  Electronically Signed   By: M.   Shick M.D.   On: 09/06/2015 13:54 DG Knee 1-2 Views Left CLINICAL DATA:  History of osteoarthritis, bilateral knee pain anteriorly  EXAM: LEFT KNEE - 1-2 VIEW  COMPARISON:  09/06/2015  FINDINGS: Normal alignment without acute osseous finding, fracture or effusion. Preserved joint spaces. No significant arthropathy. Soft tissue calcifications present along the fibula head. These are nonspecific.  IMPRESSION: No acute osseous finding or significant arthropathy.  Electronically Signed   By: M.  Shick M.D.   On: 09/06/2015 13:52 DG HIP UNILAT W OR W/O PELVIS 2-3 VIEWS RIGHT CLINICAL DATA:  Bilateral hip pain, history of arthritis. Hip pain is worse on the right.  EXAM: DG HIP (WITH OR WITHOUT PELVIS) 2-3V RIGHT  COMPARISON:  09/06/2015  FINDINGS: Bony pelvis and hips appear symmetric and intact. Specifically, the right hip demonstrates normal alignment without acute osseous finding or fracture. No significant arthropathy. Mild sclerosis of the SI joints and symphysis pubis. Lower lumbar facet arthropathy again noted. Benign vascular pelvic calcifications.  IMPRESSION: Osteopenia without acute process.  Electronically Signed   By: M.  Shick M.D.   On: 09/06/2015 13:51 DG HIP UNILAT W OR W/O PELVIS 2-3 VIEWS LEFT CLINICAL DATA:  Acute left hip pain, history of arthritis  EXAM: DG HIP (WITH OR WITHOUT PELVIS) 2-3V LEFT  COMPARISON:  09/06/2015  FINDINGS: Bones appear osteopenic. Left hip is intact without fracture or malalignment. No subluxation or dislocation. No significant arthropathy or degenerative process.  IMPRESSION: No acute finding of the left hip.  Electronically Signed   By: M.  Shick M.D.   On: 09/06/2015 13:49 DG Lumbar Spine Complete W/Bend CLINICAL DATA:  Chronic lower lumbar back pain  EXAM: LUMBAR SPINE - COMPLETE WITH BENDING VIEWS  COMPARISON:  02/21/2015 CT sagittal reconstructions    FINDINGS: Bones are osteopenic. Advanced facet  arthropathy at L3 did S1. There is minimal anterior slippage of L4 upon L5 measuring 5 mm secondary to posterior facet arthropathy. Preserved vertebral body heights and disc spaces. No acute compression fracture, wedge-shaped deformity or focal kyphosis. No instability with flexion and extension. Normal appearing pedicles. Mild bilateral SI joint arthropathy. No pars defects. Large volume of retained stool throughout the colon compatible with constipation. Prior cholecystectomy noted. Aortic atherosclerosis present.  IMPRESSION: Advanced lumbar facet arthropathy from L3-S1 with associated L4 on L5 minimal anterior slippage.  Osteopenia  No acute compression fracture  Aortic atherosclerosis  Large volume of colonic stool, suggest constipation  Electronically Signed   By: Jerilynn Mages.  Shick M.D.   On: 09/06/2015 13:48 DG Cervical Spine Complete CLINICAL DATA:  Bilateral neck pain, chronic. Previous cervical fusion.  EXAM: CERVICAL SPINE - COMPLETE 4+ VIEW  COMPARISON:  None available  FINDINGS: previous anterior cervical fusion at C4-5 and C5-6. There is solid bony fusion at both levels. Normal alignment. Degenerative changes and spondylosis noted at C3-4 and C6-7 with disc space narrowing, sclerosis and anterior osteophytes. Normal prevertebral soft tissues. Facets are aligned but demonstrate diffuse arthropathy. Foramina are patent. Trachea is midline. Lung apices are clear. Odontoid is intact  IMPRESSION: Previous anterior cervical fusion spanning C4-C6 with solid fusion at both levels.  Degenerative changes and spondylosis at C3-4 and C6-7 as well as diffuse facet arthropathy.  No acute finding by plain radiography  Electronically Signed   By: Jerilynn Mages.  Shick M.D.   On: 09/06/2015 13:46  Complexity Note: Imaging results reviewed. Results shared with Ms. Merida, using Layman's terms.                         Meds   Current Outpatient Medications:  .  amLODipine (NORVASC)  5 MG tablet, Take 1 tablet (5 mg total) by mouth daily., Disp: 30 tablet, Rfl: 1 .  atorvastatin (LIPITOR) 40 MG tablet, Take 40 mg by mouth daily at 6 PM. , Disp: , Rfl:  .  calcitRIOL (ROCALTROL) 0.25 MCG capsule, Take 1 capsule by mouth every other day., Disp: , Rfl:  .  clobetasol cream (TEMOVATE) 0.78 %, Apply 1 application topically daily., Disp: , Rfl:  .  DULoxetine (CYMBALTA) 30 MG capsule, Take 30 mg by mouth daily. , Disp: , Rfl:  .  Ergocalciferol (VITAMIN D2) 2000 units TABS, Take 1.25 mg by mouth once a week. , Disp: , Rfl:  .  glimepiride (AMARYL) 4 MG tablet, Take 2 mg by mouth 2 (two) times daily. , Disp: , Rfl: 3 .  glucose blood (ONE TOUCH ULTRA TEST) test strip, TEST TWICE DAILY, Disp: , Rfl:  .  glucose blood (ONE TOUCH ULTRA TEST) test strip, TEST TWICE DAILY, Disp: , Rfl:  .  insulin glargine (LANTUS) 100 UNIT/ML injection, Inject 24 Units into the skin at bedtime. , Disp: , Rfl:  .  insulin lispro (HUMALOG KWIKPEN) 100 UNIT/ML KiwkPen, Take up to 50 units daily in divided doses, as directed., Disp: , Rfl:  .  levothyroxine (SYNTHROID, LEVOTHROID) 88 MCG tablet, TAKE 1 TABLET BY MOUTH ONCE A DAY FOR THYROID, Disp: , Rfl:  .  metoprolol succinate (TOPROL-XL) 25 MG 24 hr tablet, TAKE 1 TABLET BY MOUTH ONCE A DAY, Disp: , Rfl:  .  nystatin (MYCOSTATIN/NYSTOP) powder, Apply topically 4 (four) times daily., Disp: 30 g, Rfl: 0 .  ONETOUCH DELICA LANCETS 67J MISC, TEST TWICE DAILY, Disp: ,  Rfl:  .  [START ON 06/01/2017] oxyCODONE (OXY IR/ROXICODONE) 5 MG immediate release tablet, Take 1 tablet (5 mg total) by mouth every 8 (eight) hours as needed for severe pain., Disp: 90 tablet, Rfl: 0 .  telmisartan (MICARDIS) 80 MG tablet, TAKE 1 TABLET BY MOUTH ONCE A DAY, Disp: , Rfl:  .  Vitamin D, Ergocalciferol, (DRISDOL) 50000 units CAPS capsule, Take 50,000 Units by mouth every 7 (seven) days. , Disp: , Rfl:  .  amoxicillin (AMOXIL) 500 MG capsule, Take 1 capsule by mouth 3 (three) times  daily., Disp: , Rfl:  .  [START ON 05/02/2017] oxyCODONE (OXY IR/ROXICODONE) 5 MG immediate release tablet, Take 1 tablet (5 mg total) by mouth every 8 (eight) hours as needed for severe pain., Disp: 90 tablet, Rfl: 0 .  [START ON 04/02/2017] oxyCODONE (OXY IR/ROXICODONE) 5 MG immediate release tablet, Take 1 tablet (5 mg total) by mouth every 8 (eight) hours as needed for severe pain., Disp: 90 tablet, Rfl: 0  ROS  Constitutional: Denies any fever or chills Gastrointestinal: No reported hemesis, hematochezia, vomiting, or acute GI distress Musculoskeletal: Denies any acute onset joint swelling, redness, loss of ROM, or weakness Neurological: No reported episodes of acute onset apraxia, aphasia, dysarthria, agnosia, amnesia, paralysis, loss of coordination, or loss of consciousness  Allergies  Ms. Wedel is allergic to buspirone; citalopram; lisinopril; metformin; pravastatin; sitagliptin; tramadol; diltiazem hcl; gabapentin; hydralazine; and lovastatin.  PFSH  Drug: Ms. Gillian  reports that she does not use drugs. Alcohol:  reports that she does not drink alcohol. Tobacco:  reports that she quit smoking about 3 years ago. Her smoking use included cigarettes. she has never used smokeless tobacco. Medical:  has a past medical history of Anemia, Anxiety, Aortic valve stenosis, Arthritis, B12 deficiency, Bowel obstruction (HCC), CKD (chronic kidney disease), Colostomy in place (HCC), Diabetes mellitus without complication (HCC), Diverticulitis large intestine, Dysrhythmia, Ectopic atrial tachycardia (HCC), FSGS (focal segmental glomerulosclerosis), Gastritis, GI bleed, Heart murmur, Hyperlipidemia, Hypertension, Hypothyroidism, Hypothyroidism, MRSA (methicillin resistant Staphylococcus aureus), Neuropathy, Neuropathy in diabetes (HCC), Obesity, Pedal edema, Psoriasis, Shortness of breath dyspnea, and Vertigo. Surgical: Ms. Larsen  has a past surgical history that includes Colectomy; laparotomy closure of  cecal perforation (05/09/2013); Tonsillectomy; Back surgery; Cardiac catheterization; Abdominal hysterectomy; Debridement of abdominal wall abscess (N/A, 02/22/2015); Excision mass abdominal (N/A, 02/24/2015); Application if wound vac (N/A, 02/24/2015); Excision mass abdominal (N/A, 02/26/2015); Application if wound vac (N/A, 02/26/2015); Wound debridement (N/A, 03/03/2015); Wound debridement (N/A, 03/09/2015); Flexible sigmoidoscopy (06/19/2015); Colostomy reversal (N/A, 07/12/2015); laparotomy (07/12/2015); laparoscopy (07/12/2015); Lysis of adhesion (07/12/2015); laparotomy (N/A, 02/06/2015); and Colonoscopy with propofol (N/A, 09/04/2016). Family: family history includes CAD in her father; Colon cancer in her brother; Diabetes in her mother; Heart attack in her father; Hypertension in her father.  Constitutional Exam  General appearance: Well nourished, well developed, and well hydrated. In no apparent acute distress Vitals:   03/05/17 1354  BP: (!) 118/101  Pulse: 79  Resp: 16  Temp: 98 F (36.7 C)  TempSrc: Oral  SpO2: 96%  Weight: 220 lb (99.8 kg)  Height: 5' 3" (1.6 m)   BMI Assessment: Estimated body mass index is 38.97 kg/m as calculated from the following:   Height as of this encounter: 5' 3" (1.6 m).   Weight as of this encounter: 220 lb (99.8 kg). Psych/Mental status: Alert, oriented x 3 (person, place, & time)       Eyes: PERLA Respiratory: No evidence of acute respiratory distress  Cervical Spine Area Exam    Skin & Axial Inspection: No masses, redness, edema, swelling, or associated skin lesions Alignment: Symmetrical Functional ROM: Unrestricted ROM      Stability: No instability detected Muscle Tone/Strength: Functionally intact. No obvious neuro-muscular anomalies detected. Sensory (Neurological): Unimpaired Palpation: No palpable anomalies              Upper Extremity (UE) Exam    Side: Right upper extremity  Side: Left upper extremity  Skin & Extremity Inspection: Skin color,  temperature, and hair growth are WNL. No peripheral edema or cyanosis. No masses, redness, swelling, asymmetry, or associated skin lesions. No contractures.  Skin & Extremity Inspection: Skin color, temperature, and hair growth are WNL. No peripheral edema or cyanosis. No masses, redness, swelling, asymmetry, or associated skin lesions. No contractures.  Functional ROM: Unrestricted ROM          Functional ROM: Unrestricted ROM          Muscle Tone/Strength: Functionally intact. No obvious neuro-muscular anomalies detected.  Muscle Tone/Strength: Functionally intact. No obvious neuro-muscular anomalies detected.  Sensory (Neurological): Unimpaired          Sensory (Neurological): Unimpaired          Palpation: No palpable anomalies              Palpation: No palpable anomalies              Specialized Test(s): Deferred         Specialized Test(s): Deferred          Thoracic Spine Area Exam  Skin & Axial Inspection: No masses, redness, or swelling Alignment: Symmetrical Functional ROM: Unrestricted ROM Stability: No instability detected Muscle Tone/Strength: Functionally intact. No obvious neuro-muscular anomalies detected. Sensory (Neurological): Unimpaired Muscle strength & Tone: No palpable anomalies  Lumbar Spine Area Exam  Skin & Axial Inspection: No masses, redness, or swelling Alignment: Symmetrical Functional ROM: Unrestricted ROM      Stability: No instability detected Muscle Tone/Strength: Functionally intact. No obvious neuro-muscular anomalies detected. Sensory (Neurological): Unimpaired Palpation: No palpable anomalies       Provocative Tests: Lumbar Hyperextension and rotation test: evaluation deferred today       Lumbar Lateral bending test: evaluation deferred today       Patrick's Maneuver: evaluation deferred today                    Gait & Posture Assessment  Ambulation: Unassisted Gait: Relatively normal for age and body habitus Posture: WNL   Lower Extremity Exam     Side: Right lower extremity  Side: Left lower extremity  Skin & Extremity Inspection: Skin color, temperature, and hair growth are WNL. No peripheral edema or cyanosis. No masses, redness, swelling, asymmetry, or associated skin lesions. No contractures.  Skin & Extremity Inspection: Skin color, temperature, and hair growth are WNL. No peripheral edema or cyanosis. No masses, redness, swelling, asymmetry, or associated skin lesions. No contractures.  Functional ROM: Unrestricted ROM          Functional ROM: Unrestricted ROM          Muscle Tone/Strength: Functionally intact. No obvious neuro-muscular anomalies detected.  Muscle Tone/Strength: Functionally intact. No obvious neuro-muscular anomalies detected.  Sensory (Neurological): Unimpaired  Sensory (Neurological): Unimpaired  Palpation: No palpable anomalies  Palpation: No palpable anomalies   Assessment  Primary Diagnosis & Pertinent Problem List: The primary encounter diagnosis was Chronic low back pain (Location of Secondary source of pain) (Bilateral) (L>R). Diagnoses of Chronic knee pain (Bilateral) (L>R), Chronic   abdominal pain (Location of Primary Source of Pain) (Bilateral) (L>R), Chronic pain of both hips, and Chronic pain syndrome were also pertinent to this visit.  Status Diagnosis  Controlled Controlled Controlled 1. Chronic low back pain (Location of Secondary source of pain) (Bilateral) (L>R)   2. Chronic knee pain (Bilateral) (L>R)   3. Chronic abdominal pain (Location of Primary Source of Pain) (Bilateral) (L>R)   4. Chronic pain of both hips   5. Chronic pain syndrome     Problems updated and reviewed during this visit: No problems updated. Plan of Care  Pharmacotherapy (Medications Ordered): Meds ordered this encounter  Medications  . oxyCODONE (OXY IR/ROXICODONE) 5 MG immediate release tablet    Sig: Take 1 tablet (5 mg total) by mouth every 8 (eight) hours as needed for severe pain.    Dispense:  90 tablet     Refill:  0    Do not place this medication, or any other prescription from our practice, on "Automatic Refill". Patient may have prescription filled one day early if pharmacy is closed on scheduled refill date. Do not fill until:06/01/2017 To last until: 07/01/2017    Order Specific Question:   Supervising Provider    Answer:   NAVEIRA, FRANCISCO [[id number]]  . oxyCODONE (OXY IR/ROXICODONE) 5 MG immediate release tablet    Sig: Take 1 tablet (5 mg total) by mouth every 8 (eight) hours as needed for severe pain.    Dispense:  90 tablet    Refill:  0    Do not place this medication, or any other prescription from our practice, on "Automatic Refill". Patient may have prescription filled one day early if pharmacy is closed on scheduled refill date. Do not fill until: 05/02/2017 To last until: 06/01/2017    Order Specific Question:   Supervising Provider    Answer:   NAVEIRA, FRANCISCO [[id number]]  . oxyCODONE (OXY IR/ROXICODONE) 5 MG immediate release tablet    Sig: Take 1 tablet (5 mg total) by mouth every 8 (eight) hours as needed for severe pain.    Dispense:  90 tablet    Refill:  0    Do not place this medication, or any other prescription from our practice, on "Automatic Refill". Patient may have prescription filled one day early if pharmacy is closed on scheduled refill date. Do not fill until:04/02/2017 To last until: 05/02/2017    Order Specific Question:   Supervising Provider    Answer:   NAVEIRA, FRANCISCO [[id number]]   New Prescriptions   No medications on file   Medications administered today: Brendalee J. Buehl had no medications administered during this visit. Lab-work, procedure(s), and/or referral(s): No orders of the defined types were placed in this encounter.  Imaging and/or referral(s): None  Interventional therapies: Planned, scheduled, and/or pending:  Not at this time.   Considering:  Diagnostic bilateral lumbar facet block  Possible bilateral lumbar facet rate  frequency ablation.  Diagnostic bilateral celiac plexus block  Diagnostic bilateral intra-articular hip injection  Possible diagnostic bilateral femoral nerve and obturator nerve articular branch blocks  Possible bilateral femoral nerve and obturator nerve articular branch frequency ablation.  Diagnostic bilateral intra-articular knee joint injection  Possible series of 5 bilateral intra-articular Hyalgan knee injections.  Bilateral diagnostic genicular nerve block  Possible bilateral genicular nerve radiofrequency ablation    Palliative PRN treatment(s):  Diagnostic bilateral lumbar facet block  Diagnostic bilateral celiac plexus block  Diagnostic bilateral intra-articular hip injection  Possible diagnostic bilateral femoral nerve and obturator nerve articular   branch blocks  Diagnostic bilateral intra-articular knee joint injection Bilateral diagnostic genicular nerve block      Provider-requested follow-up: Return in about 3 months (around 06/03/2017) for MedMgmt with Me ( ).  Future Appointments  Date Time Provider Department Center  05/27/2017  1:45 PM ,  M, NP [facility] None   Primary Care Physician: Klein, Bert J III, MD Location: [facility] Note by:  M.  NP Date: 03/05/2017; Time: 3:54 PM  Pain Score Disclaimer: We use the NRS-11 scale. This is a self-reported, subjective measurement of pain severity with only modest accuracy. It is used primarily to identify changes within a particular patient. It must be understood that outpatient pain scales are significantly less accurate that those used for research, where they can be applied under ideal controlled circumstances with minimal exposure to variables. In reality, the score is likely to be a combination of pain intensity and pain affect, where pain affect describes the degree of emotional arousal or changes in action readiness caused by the sensory experience of pain.  Factors such as social and work situation, setting, emotional state, anxiety levels, expectation, and prior pain experience may influence pain perception and show large inter-individual differences that may also be affected by time variables.  Patient instructions provided during this appointment: Patient Instructions    ____________________________________________________________________________________________  Medication Rules  Applies to: All patients receiving prescriptions (written or electronic).  Pharmacy of record: Pharmacy where electronic prescriptions will be sent. If written prescriptions are taken to a different pharmacy, please inform the nursing staff. The pharmacy listed in the electronic medical record should be the one where you would like electronic prescriptions to be sent.  Prescription refills: Only during scheduled appointments. Applies to both, written and electronic prescriptions.  NOTE: The following applies primarily to controlled substances (Opioid* Pain Medications).   Patient's responsibilities: 1. Pain Pills: Bring all pain pills to every appointment (except for procedure appointments). 2. Pill Bottles: Bring pills in original pharmacy bottle. Always bring newest bottle. Bring bottle, even if empty. 3. Medication refills: You are responsible for knowing and keeping track of what medications you need refilled. The day before your appointment, write a list of all prescriptions that need to be refilled. Bring that list to your appointment and give it to the admitting nurse. Prescriptions will be written only during appointments. If you forget a medication, it will not be "Called in", "Faxed", or "electronically sent". You will need to get another appointment to get these prescribed. 4. Prescription Accuracy: You are responsible for carefully inspecting your prescriptions before leaving our office. Have the discharge nurse carefully go over each prescription with  you, before taking them home. Make sure that your name is accurately spelled, that your address is correct. Check the name and dose of your medication to make sure it is accurate. Check the number of pills, and the written instructions to make sure they are clear and accurate. Make sure that you are given enough medication to last until your next medication refill appointment. 5. Taking Medication: Take medication as prescribed. Never take more pills than instructed. Never take medication more frequently than prescribed. Taking less pills or less frequently is permitted and encouraged, when it comes to controlled substances (written prescriptions).  6. Inform other Doctors: Always inform, all of your healthcare providers, of all the medications you take. 7. Pain Medication from other Providers: You are not allowed to accept any additional pain medication from any other Doctor or Healthcare provider.   There are two exceptions to this rule. (see below) In the event that you require additional pain medication, you are responsible for notifying us, as stated below. 8. Medication Agreement: You are responsible for carefully reading and following our Medication Agreement. This must be signed before receiving any prescriptions from our practice. Safely store a copy of your signed Agreement. Violations to the Agreement will result in no further prescriptions. (Additional copies of our Medication Agreement are available upon request.) 9. Laws, Rules, & Regulations: All patients are expected to follow all Federal and [employer], Rules, [employer]. Ignorance of the Laws does not constitute a valid excuse. The use of any illegal substances is prohibited. 10. Adopted CDC guidelines & recommendations: Target dosing levels will be at or below 60 MME/day. Use of benzodiazepines** is not recommended.  Exceptions: There are only two exceptions to the rule of not receiving pain medications from other Healthcare  Providers. 1. Exception #1 (Emergencies): In the event of an emergency (i.e.: accident requiring emergency care), you are allowed to receive additional pain medication. However, you are responsible for: As soon as you are able, call our office [phone], at any time of the day or night, and leave a message stating your name, the date and nature of the emergency, and the name and dose of the medication prescribed. In the event that your call is answered by a member of our staff, make sure to document and save the date, time, and the name of the person that took your information.  2. Exception #2 (Planned Surgery): In the event that you are scheduled by another doctor or dentist to have any type of surgery or procedure, you are allowed (for a period no longer than 30 days), to receive additional pain medication, for the acute post-op pain. However, in this case, you are responsible for picking up a copy of our "Post-op Pain Management for Surgeons" handout, and giving it to your surgeon or dentist. This document is available at our office, and does not require an appointment to obtain it. Simply go to our office during business hours (Monday-Thursday from 8:00 AM to 4:00 PM) (Friday 8:00 AM to 12:00 Noon) or if you have a scheduled appointment with [place], prior to your surgery, and ask for it by name. In addition, you will need to provide [place] with your name, name of your surgeon, type of surgery, and date of procedure or surgery.  *Opioid medications include: morphine, codeine, oxycodone, oxymorphone, hydrocodone, hydromorphone, meperidine, tramadol, tapentadol, buprenorphine, fentanyl, methadone. **Benzodiazepine medications include: diazepam (Valium), alprazolam (Xanax), clonazepam (Klonopine), lorazepam (Ativan), clorazepate (Tranxene), chlordiazepoxide (Librium), estazolam (Prosom), oxazepam (Serax), temazepam (Restoril), triazolam  (Halcion)  ____________________________________________________________________________________________  ____________________________________________________________________________________________  Pain Scale  Introduction: The pain score used by this practice is the Verbal Numerical Rating Scale (VNRS-11). This is an 11-point scale. It is for adults and children 10 years or older. There are significant differences in how the pain score is reported, used, and applied. Forget everything you learned in the past and learn this scoring system.  General Information: The scale should reflect your current level of pain. Unless you are specifically asked for the level of your worst pain, or your average pain. If you are asked for one of these two, then it should be understood that it is over the past 24 hours.  Basic Activities of Daily Living (ADL): Personal hygiene, dressing, eating, transferring, and using restroom.  Instructions: Most patients tend to report their level of pain as a  combination of two factors, their physical pain and their psychosocial pain. This last one is also known as "suffering" and it is reflection of how physical pain affects you socially and psychologically. From now on, report them separately. From this point on, when asked to report your pain level, report only your physical pain. Use the following table for reference.  Pain Clinic Pain Levels (0-5/10)  Pain Level Score  Description  No Pain 0   Mild pain 1 Nagging, annoying, but does not interfere with basic activities of daily living (ADL). Patients are able to eat, bathe, get dressed, toileting (being able to get on and off the toilet and perform personal hygiene functions), transfer (move in and out of bed or a chair without assistance), and maintain continence (able to control bladder and bowel functions). Blood pressure and heart rate are unaffected. A normal heart rate for a healthy adult ranges from 60 to 100 bpm  (beats per minute).   Mild to moderate pain 2 Noticeable and distracting. Impossible to hide from other people. More frequent flare-ups. Still possible to adapt and function close to normal. It can be very annoying and may have occasional stronger flare-ups. With discipline, patients may get used to it and adapt.   Moderate pain 3 Interferes significantly with activities of daily living (ADL). It becomes difficult to feed, bathe, get dressed, get on and off the toilet or to perform personal hygiene functions. Difficult to get in and out of bed or a chair without assistance. Very distracting. With effort, it can be ignored when deeply involved in activities.   Moderately severe pain 4 Impossible to ignore for more than a few minutes. With effort, patients may still be able to manage work or participate in some social activities. Very difficult to concentrate. Signs of autonomic nervous system discharge are evident: dilated pupils (mydriasis); mild sweating (diaphoresis); sleep interference. Heart rate becomes elevated (>115 bpm). Diastolic blood pressure (lower number) rises above 100 mmHg. Patients find relief in laying down and not moving.   Severe pain 5 Intense and extremely unpleasant. Associated with frowning face and frequent crying. Pain overwhelms the senses.  Ability to do any activity or maintain social relationships becomes significantly limited. Conversation becomes difficult. Pacing back and forth is common, as getting into a comfortable position is nearly impossible. Pain wakes you up from deep sleep. Physical signs will be obvious: pupillary dilation; increased sweating; goosebumps; brisk reflexes; cold, clammy hands and feet; nausea, vomiting or dry heaves; loss of appetite; significant sleep disturbance with inability to fall asleep or to remain asleep. When persistent, significant weight loss is observed due to the complete loss of appetite and sleep deprivation.  Blood pressure and heart  rate becomes significantly elevated. Caution: If elevated blood pressure triggers a pounding headache associated with blurred vision, then the patient should immediately seek attention at an urgent or emergency care unit, as these may be signs of an impending stroke.    Emergency Department Pain Levels (6-10/10)  Emergency Room Pain 6 Severely limiting. Requires emergency care and should not be seen or managed at an outpatient pain management facility. Communication becomes difficult and requires great effort. Assistance to reach the emergency department may be required. Facial flushing and profuse sweating along with potentially dangerous increases in heart rate and blood pressure will be evident.   Distressing pain 7 Self-care is very difficult. Assistance is required to transport, or use restroom. Assistance to reach the emergency department will be required. Tasks requiring coordination,   such as bathing and getting dressed become very difficult.   Disabling pain 8 Self-care is no longer possible. At this level, pain is disabling. The individual is unable to do even the most "basic" activities such as walking, eating, bathing, dressing, transferring to a bed, or toileting. Fine motor skills are lost. It is difficult to think clearly.   Incapacitating pain 9 Pain becomes incapacitating. Thought processing is no longer possible. Difficult to remember your own name. Control of movement and coordination are lost.   The worst pain imaginable 10 At this level, most patients pass out from pain. When this level is reached, collapse of the autonomic nervous system occurs, leading to a sudden drop in blood pressure and heart rate. This in turn results in a temporary and dramatic drop in blood flow to the brain, leading to a loss of consciousness. Fainting is one of the body's self defense mechanisms. Passing out puts the brain in a calmed state and causes it to shut down for a while, in order to begin the  healing process.    Summary: 1. Refer to this scale when providing us with your pain level. 2. Be accurate and careful when reporting your pain level. This will help with your care. 3. Over-reporting your pain level will lead to loss of credibility. 4. Even a level of 1/10 means that there is pain and will be treated at our facility. 5. High, inaccurate reporting will be documented as "Symptom Exaggeration", leading to loss of credibility and suspicions of possible secondary gains such as obtaining more narcotics, or wanting to appear disabled, for fraudulent reasons. 6. Only pain levels of 5 or below will be seen at our facility. 7. Pain levels of 6 and above will be sent to the Emergency Department and the appointment cancelled. ____________________________________________________________________________________________    BMI Assessment: Estimated body mass index is 38.97 kg/m as calculated from the following:   Height as of this encounter: 5' 3" (1.6 m).   Weight as of this encounter: 220 lb (99.8 kg).  BMI interpretation table: BMI level Category Range association with higher incidence of chronic pain  <18 kg/m2 Underweight   18.5-24.9 kg/m2 Ideal body weight   25-29.9 kg/m2 Overweight Increased incidence by 20%  30-34.9 kg/m2 Obese (Class I) Increased incidence by 68%  35-39.9 kg/m2 Severe obesity (Class II) Increased incidence by 136%  >40 kg/m2 Extreme obesity (Class III) Increased incidence by 254%   BMI Readings from Last 4 Encounters:  03/05/17 38.97 kg/m  12/03/16 38.79 kg/m  09/05/16 38.97 kg/m  09/04/16 38.97 kg/m   Wt Readings from Last 4 Encounters:  03/05/17 220 lb (99.8 kg)  12/03/16 219 lb (99.3 kg)  09/05/16 220 lb (99.8 kg)  09/04/16 220 lb (99.8 kg)     

## 2017-03-25 ENCOUNTER — Other Ambulatory Visit: Payer: Self-pay | Admitting: Internal Medicine

## 2017-03-25 DIAGNOSIS — Z1231 Encounter for screening mammogram for malignant neoplasm of breast: Secondary | ICD-10-CM

## 2017-05-27 ENCOUNTER — Other Ambulatory Visit: Payer: Self-pay

## 2017-05-27 ENCOUNTER — Ambulatory Visit: Payer: Medicare Other | Attending: Nurse Practitioner | Admitting: Nurse Practitioner

## 2017-05-27 ENCOUNTER — Encounter: Payer: Self-pay | Admitting: Nurse Practitioner

## 2017-05-27 VITALS — BP 138/76 | HR 76 | Temp 98.2°F | Resp 16 | Ht 63.0 in | Wt 218.0 lb

## 2017-05-27 DIAGNOSIS — N184 Chronic kidney disease, stage 4 (severe): Secondary | ICD-10-CM | POA: Insufficient documentation

## 2017-05-27 DIAGNOSIS — E538 Deficiency of other specified B group vitamins: Secondary | ICD-10-CM | POA: Diagnosis not present

## 2017-05-27 DIAGNOSIS — K56609 Unspecified intestinal obstruction, unspecified as to partial versus complete obstruction: Secondary | ICD-10-CM | POA: Diagnosis not present

## 2017-05-27 DIAGNOSIS — I129 Hypertensive chronic kidney disease with stage 1 through stage 4 chronic kidney disease, or unspecified chronic kidney disease: Secondary | ICD-10-CM | POA: Insufficient documentation

## 2017-05-27 DIAGNOSIS — Z794 Long term (current) use of insulin: Secondary | ICD-10-CM | POA: Diagnosis not present

## 2017-05-27 DIAGNOSIS — D649 Anemia, unspecified: Secondary | ICD-10-CM | POA: Diagnosis not present

## 2017-05-27 DIAGNOSIS — M7918 Myalgia, other site: Secondary | ICD-10-CM | POA: Diagnosis not present

## 2017-05-27 DIAGNOSIS — M542 Cervicalgia: Secondary | ICD-10-CM | POA: Diagnosis not present

## 2017-05-27 DIAGNOSIS — M79602 Pain in left arm: Secondary | ICD-10-CM | POA: Insufficient documentation

## 2017-05-27 DIAGNOSIS — Z87891 Personal history of nicotine dependence: Secondary | ICD-10-CM | POA: Diagnosis not present

## 2017-05-27 DIAGNOSIS — M47816 Spondylosis without myelopathy or radiculopathy, lumbar region: Secondary | ICD-10-CM | POA: Diagnosis not present

## 2017-05-27 DIAGNOSIS — Z888 Allergy status to other drugs, medicaments and biological substances status: Secondary | ICD-10-CM | POA: Diagnosis not present

## 2017-05-27 DIAGNOSIS — Z79891 Long term (current) use of opiate analgesic: Secondary | ICD-10-CM

## 2017-05-27 DIAGNOSIS — G894 Chronic pain syndrome: Secondary | ICD-10-CM

## 2017-05-27 DIAGNOSIS — I350 Nonrheumatic aortic (valve) stenosis: Secondary | ICD-10-CM | POA: Insufficient documentation

## 2017-05-27 DIAGNOSIS — K2970 Gastritis, unspecified, without bleeding: Secondary | ICD-10-CM | POA: Insufficient documentation

## 2017-05-27 DIAGNOSIS — E034 Atrophy of thyroid (acquired): Secondary | ICD-10-CM | POA: Insufficient documentation

## 2017-05-27 DIAGNOSIS — F419 Anxiety disorder, unspecified: Secondary | ICD-10-CM | POA: Insufficient documentation

## 2017-05-27 DIAGNOSIS — M47896 Other spondylosis, lumbar region: Secondary | ICD-10-CM | POA: Insufficient documentation

## 2017-05-27 DIAGNOSIS — E1122 Type 2 diabetes mellitus with diabetic chronic kidney disease: Secondary | ICD-10-CM | POA: Diagnosis not present

## 2017-05-27 DIAGNOSIS — G8929 Other chronic pain: Secondary | ICD-10-CM | POA: Diagnosis not present

## 2017-05-27 MED ORDER — OXYCODONE HCL 5 MG PO TABS: 5.0000 mg | ORAL_TABLET | Freq: Three times a day (TID) | ORAL | 0 refills | Status: DC | PRN

## 2017-05-27 NOTE — Progress Notes (Signed)
Patient's Name: [name]  MRN: [id number]  Referring Provider: Adin Hector, MD  DOB: [birth year]  PCP: Adin Hector, MD  DOS: 05/27/2017  Note by: Vevelyn Francois NP  Service setting: Ambulatory outpatient  Specialty: Interventional Pain Management  Location: [facility] (AMB) Pain Management Facility    Patient type: Established    Primary Reason(s) for Visit: Encounter for prescription drug management. (Level of risk: moderate)  CC: Neck Pain (bilateral)  HPI  [name] is a 67 y.o. year old, female patient, who comes today for a medication management evaluation. She has Obesity; Small bowel obstruction ([place]); Absolute anemia; Aortic heart valve narrowing; Arthritis; B12 deficiency; Benign essential HTN; Chronic kidney disease (CKD), stage III (moderate) ([place]); Focal and segmental hyalinosis; Bleeding gastrointestinal; Acquired atrophy of thyroid; Abnormal presence of protein in urine; Psoriasis; Paroxysmal supraventricular tachycardia (HCC); SOBOE (shortness of breath on exertion); Type 2 diabetes mellitus with stage 4 chronic kidney disease, with long-term current use of insulin ([place]); Chronic abdominal pain (Location of Primary Source of Pain) (Bilateral) (L>R); Long term current use of opiate analgesic; Long term prescription opiate use; Opiate use (22.5 MME/Day); Encounter for therapeutic drug level monitoring; Encounter for pain management planning; Neurogenic pain; Musculoskeletal pain; Visceral abdominal pain; Diabetic peripheral neuropathy ([place]); Chronic low back pain (Location of Secondary source of pain) (Bilateral) (L>R); Chronic lower extremity pain (Location of Tertiary source of pain) (Bilateral) (R>L); Chronic hip pain (Bilateral) (L>R); Chronic knee pain (Bilateral) (L>R); Chronic neck pain (Bilateral) (L>R); H/O colostomy; Diabetic neuropathy ([place]); Recurrent major depressive disorder, in full remission ([place]); Lumbar spondylosis; Lumbar facet syndrome (Bilateral) (L>R);  Polyneuropathy; Heart burn; Osteopenia of multiple sites; FSGS (focal segmental glomerulosclerosis); Chronic pain syndrome; Obesity, Class II, BMI 35-39.9; Hx of adenomatous colonic polyps; Hyperlipidemia, mixed; Uncontrolled type 2 diabetes mellitus with hyperglycemia, with long-term current use of insulin ([place]); Morbid obesity ([place]); Myofascial pain; and Chronic pain of left upper extremity on their problem list. Her primarily concern today is the Neck Pain (bilateral)  Pain Assessment: Location: Right, Left Neck Radiating: shoulders bilaterally, left worse down arm and will left hand numb Onset: More than a month ago Duration: Chronic pain Quality: Throbbing, Aching Severity: 7 /10 (self-reported pain score)  Note: Reported level is compatible with observation. Clinically the patient looks like a 2/10 A 2/10 is viewed as "Mild to Moderate" and described as noticeable and distracting. Impossible to hide from other people. More frequent flare-ups. Still possible to adapt and function close to normal. It can be very annoying and may have occasional stronger flare-ups. With discipline, patients may get used to it and adapt. Information on the proper use of the pain scale provided to the patient today. When using our objective Pain Scale, levels between 6 and 10/10 are said to belong in an emergency room, as it progressively worsens from a 6/10, described as severely limiting, requiring emergency care not usually available at an outpatient pain management facility. At a 6/10 level, communication becomes difficult and requires great effort. Assistance to reach the emergency department may be required. Facial flushing and profuse sweating along with potentially dangerous increases in heart rate and blood pressure will be evident. Effect on ADL: adl's, fixing hair, lifting Timing:   Modifying factors: medications  [name] was last scheduled for an appointment on 03/05/2017 for medication management. During  today's appointment we reviewed [name]'s chronic pain status, as well as her outpatient medication regimen. She admits that the uses muscle relaxer and Oxycodone  which is effective. She denies any interventional therapy. She had limitation with steroids secondary to her diabetes.  The patient  reports that she does not use drugs. Her body mass index is 38.62 kg/m.  Further details on both, my assessment(s), as well as the proposed treatment plan, please see below.  Controlled Substance Pharmacotherapy Assessment REMS (Risk Evaluation and Mitigation Strategy)  Analgesic:Oxycodone IR 26m every 8hours (149mday) MME/day:22.37m36may     GarIgnatius SpeckingN  05/27/2017  2:07 PM  Sign at close encounter Nursing Pain Medication Assessment:  Safety precautions to be maintained throughout the outpatient stay will include: orient to surroundings, keep bed in low position, maintain call bell within reach at all times, provide assistance with transfer out of bed and ambulation.  Medication Inspection Compliance: Pill count conducted under aseptic conditions, in front of the patient. Neither the pills nor the bottle was removed from the patient's sight at any time. Once count was completed pills were immediately returned to the patient in their original bottle.  Medication: See above Pill/Patch Count: 20 of 90 pills remain Pill/Patch Appearance: Markings consistent with prescribed medication Bottle Appearance: Standard pharmacy container. Clearly labeled. Filled Date: 3 / 15 / 2019 Last Medication intake:  Today   Pharmacokinetics: Liberation and absorption (onset of action): WNL Distribution (time to peak effect): WNL Metabolism and excretion (duration of action): WNL         Pharmacodynamics: Desired effects: Analgesia: Ms. [employer] >50% benefit. Functional ability: Patient reports that medication allows her to accomplish basic ADLs Clinically meaningful improvement in function  (CMIF): Sustained CMIF goals met Perceived effectiveness: Described as relatively effective, allowing for increase in activities of daily living (ADL) Undesirable effects: Side-effects or Adverse reactions: None reported Monitoring: [place] PMP: Online review of the past 12-20-monthiod conducted. Compliant with practice rules and regulations Last UDS on record: Summary  Date Value Ref Range Status  09/05/2016 FINAL  Final    Comment:    ==================================================================== TOXASSURE SELECT 13 (MW) ==================================================================== Test                             Result       Flag       Units Drug Present and Declared for Prescription Verification   Oxycodone                      596          EXPECTED   ng/mg creat   Oxymorphone                    485          EXPECTED   ng/mg creat   Noroxycodone                   469          EXPECTED   ng/mg creat   Noroxymorphone                 101          EXPECTED   ng/mg creat    Sources of oxycodone are scheduled prescription medications.    Oxymorphone, noroxycodone, and noroxymorphone are expected    metabolites of oxycodone. Oxymorphone is also available as a    scheduled prescription medication. ==================================================================== Test  Result    Flag   Units      Ref Range   Creatinine              85               mg/dL      >=20 ==================================================================== Declared Medications:  The flagging and interpretation on this report are based on the  following declared medications.  Unexpected results may arise from  inaccuracies in the declared medications.  **Note: The testing scope of this panel includes these medications:  Oxycodone  **Note: The testing scope of this panel does not include following  reported medications:  Amlodipine Besylate  Atorvastatin  Calcitriol   Clobetasol  Duloxetine  Famotidine  Glimepiride  Insulin glargine  Levothyroxine  Metoprolol  Nystatin  Telmisartan  Vitamin D2 (Ergocalciferol) ==================================================================== For clinical consultation, please call [phone]. ====================================================================    UDS interpretation: Compliant          Medication Assessment Form: Reviewed. Patient indicates being compliant with therapy Treatment compliance: Compliant Risk Assessment Profile: Aberrant behavior: See prior evaluations. None observed or detected today Comorbid factors increasing risk of overdose: See prior notes. No additional risks detected today Risk of substance use disorder (SUD): Low  ORT Scoring interpretation table:  Score <3 = Low Risk for SUD  Score between 4-7 = Moderate Risk for SUD  Score >8 = High Risk for Opioid Abuse   Risk Mitigation Strategies:  Patient Counseling: Covered Patient-Prescriber Agreement (PPA): Present and active  Notification to other healthcare providers: Done  Pharmacologic Plan: No change in therapy, at this time.             Laboratory Chemistry  Inflammation Markers (CRP: Acute Phase) (ESR: Chronic Phase) Lab Results  Component Value Date   CRP 0.6 11/07/2015   ESRSEDRATE 16 11/07/2015   LATICACIDVEN 0.8 02/21/2015                         Rheumatology Markers Lab Results  Component Value Date   LABURIC 7.6 (H) 07/14/2015                        Renal Function Markers Lab Results  Component Value Date   BUN 33 (H) 07/22/2015   CREATININE 2.33 (H) 07/22/2015   GFRAA 24 (L) 07/22/2015   GFRNONAA 21 (L) 07/22/2015                              Hepatic Function Markers Lab Results  Component Value Date   AST 14 (L) 02/22/2015   ALT 13 (L) 02/22/2015   ALBUMIN 2.5 (L) 07/21/2015   ALKPHOS 86 02/22/2015   LIPASE 21 02/04/2015                        Electrolytes Lab Results   Component Value Date   NA 138 07/22/2015   K 4.2 07/22/2015   CL 110 07/22/2015   CALCIUM 7.6 (L) 07/22/2015   MG 2.2 11/07/2015   PHOS 4.2 07/21/2015                        Neuropathy Markers Lab Results  Component Value Date   VITAMINB12 273 11/07/2015   HGBA1C 9.2 (H) 02/04/2015                          Bone Pathology Markers Lab Results  Component Value Date   25OHVITD1 43 11/07/2015   25OHVITD2 38 11/07/2015   25OHVITD3 5.4 11/07/2015                         Coagulation Parameters Lab Results  Component Value Date   INR 1.28 02/22/2015   LABPROT 16.1 (H) 02/22/2015   APTT 36 02/22/2015   PLT 315 07/22/2015                        Cardiovascular Markers Lab Results  Component Value Date   TROPONINI 0.08 (H) 07/19/2015   HGB 8.9 (L) 07/22/2015   HCT 28.0 (L) 07/22/2015                         CA Markers No results found for: CEA, CA125, LABCA2                      Note: Lab results reviewed.  Recent Diagnostic Imaging Results  DG Knee 1-2 Views Right CLINICAL DATA:  Anterior knee pain  EXAM: RIGHT KNEE - 1-2 VIEW  COMPARISON:  09/06/2015  FINDINGS: Normal right knee alignment without acute fracture or effusion. Mild osteoarthritis of the medial and lateral compartments with minor joint space loss and bony spurring. Two small ossified loose joint bodies present in the midline posteriorly projecting over the tibial spines on the frontal view.  On the lateral view, there is advanced degenerative arthritis of the patellofemoral joint with joint space loss, sclerosis and bony spurring.  IMPRESSION: Advanced right knee patellofemoral osteoarthritis.  No acute osseous finding, fracture or effusion.  Electronically Signed   By: M.  Shick M.D.   On: 09/06/2015 13:54 DG Knee 1-2 Views Left CLINICAL DATA:  History of osteoarthritis, bilateral knee pain anteriorly  EXAM: LEFT KNEE - 1-2 VIEW  COMPARISON:  09/06/2015  FINDINGS: Normal alignment  without acute osseous finding, fracture or effusion. Preserved joint spaces. No significant arthropathy. Soft tissue calcifications present along the fibula head. These are nonspecific.  IMPRESSION: No acute osseous finding or significant arthropathy.  Electronically Signed   By: M.  Shick M.D.   On: 09/06/2015 13:52 DG HIP UNILAT W OR W/O PELVIS 2-3 VIEWS RIGHT CLINICAL DATA:  Bilateral hip pain, history of arthritis. Hip pain is worse on the right.  EXAM: DG HIP (WITH OR WITHOUT PELVIS) 2-3V RIGHT  COMPARISON:  09/06/2015  FINDINGS: Bony pelvis and hips appear symmetric and intact. Specifically, the right hip demonstrates normal alignment without acute osseous finding or fracture. No significant arthropathy. Mild sclerosis of the SI joints and symphysis pubis. Lower lumbar facet arthropathy again noted. Benign vascular pelvic calcifications.  IMPRESSION: Osteopenia without acute process.  Electronically Signed   By: M.  Shick M.D.   On: 09/06/2015 13:51 DG HIP UNILAT W OR W/O PELVIS 2-3 VIEWS LEFT CLINICAL DATA:  Acute left hip pain, history of arthritis  EXAM: DG HIP (WITH OR WITHOUT PELVIS) 2-3V LEFT  COMPARISON:  09/06/2015  FINDINGS: Bones appear osteopenic. Left hip is intact without fracture or malalignment. No subluxation or dislocation. No significant arthropathy or degenerative process.  IMPRESSION: No acute finding of the left hip.  Electronically Signed   By: M.  Shick M.D.   On: 09/06/2015 13:49 DG Lumbar Spine Complete W/Bend CLINICAL DATA:  Chronic lower lumbar back pain  EXAM: LUMBAR SPINE - COMPLETE WITH BENDING VIEWS    COMPARISON:  02/21/2015 CT sagittal reconstructions  FINDINGS: Bones are osteopenic. Advanced facet arthropathy at L3 did S1. There is minimal anterior slippage of L4 upon L5 measuring 5 mm secondary to posterior facet arthropathy. Preserved vertebral body heights and disc spaces. No acute compression fracture,  wedge-shaped deformity or focal kyphosis. No instability with flexion and extension. Normal appearing pedicles. Mild bilateral SI joint arthropathy. No pars defects. Large volume of retained stool throughout the colon compatible with constipation. Prior cholecystectomy noted. Aortic atherosclerosis present.  IMPRESSION: Advanced lumbar facet arthropathy from L3-S1 with associated L4 on L5 minimal anterior slippage.  Osteopenia  No acute compression fracture  Aortic atherosclerosis  Large volume of colonic stool, suggest constipation  Electronically Signed   By: M.  Shick M.D.   On: 09/06/2015 13:48 DG Cervical Spine Complete CLINICAL DATA:  Bilateral neck pain, chronic. Previous cervical fusion.  EXAM: CERVICAL SPINE - COMPLETE 4+ VIEW  COMPARISON:  None available  FINDINGS: previous anterior cervical fusion at C4-5 and C5-6. There is solid bony fusion at both levels. Normal alignment. Degenerative changes and spondylosis noted at C3-4 and C6-7 with disc space narrowing, sclerosis and anterior osteophytes. Normal prevertebral soft tissues. Facets are aligned but demonstrate diffuse arthropathy. Foramina are patent. Trachea is midline. Lung apices are clear. Odontoid is intact  IMPRESSION: Previous anterior cervical fusion spanning C4-C6 with solid fusion at both levels.  Degenerative changes and spondylosis at C3-4 and C6-7 as well as diffuse facet arthropathy.  No acute finding by plain radiography  Electronically Signed   By: M.  Shick M.D.   On: 09/06/2015 13:46  Complexity Note: Imaging results reviewed. Results shared with [name], using Layman's terms.                         Meds   Current Outpatient Medications:  .  amLODipine (NORVASC) 5 MG tablet, Take 1 tablet (5 mg total) by mouth daily., Disp: 30 tablet, Rfl: 1 .  atorvastatin (LIPITOR) 40 MG tablet, Take 40 mg by mouth daily at 6 PM. , Disp: , Rfl:  .  calcitRIOL (ROCALTROL) 0.25 MCG  capsule, Take 1 capsule by mouth every other day., Disp: , Rfl:  .  cetirizine (ZYRTEC) 10 MG tablet, Take 10 mg by mouth daily., Disp: , Rfl: 0 .  clobetasol cream (TEMOVATE) 0.05 %, Apply 1 application topically daily., Disp: , Rfl:  .  DULoxetine (CYMBALTA) 30 MG capsule, Take 30 mg by mouth daily. , Disp: , Rfl:  .  Ergocalciferol (VITAMIN D2) 2000 units TABS, Take 1.25 mg by mouth once a week. , Disp: , Rfl:  .  glimepiride (AMARYL) 4 MG tablet, Take 2 mg by mouth 2 (two) times daily. , Disp: , Rfl: 3 .  glucose blood (ONE TOUCH ULTRA TEST) test strip, TEST TWICE DAILY, Disp: , Rfl:  .  glucose blood (ONE TOUCH ULTRA TEST) test strip, TEST TWICE DAILY, Disp: , Rfl:  .  insulin glargine (LANTUS) 100 UNIT/ML injection, Inject 24 Units into the skin at bedtime. , Disp: , Rfl:  .  insulin lispro (HUMALOG KWIKPEN) 100 UNIT/ML KiwkPen, Take up to 50 units daily in divided doses, as directed., Disp: , Rfl:  .  levothyroxine (SYNTHROID, LEVOTHROID) 88 MCG tablet, TAKE 1 TABLET BY MOUTH ONCE A DAY FOR THYROID, Disp: , Rfl:  .  metoprolol succinate (TOPROL-XL) 25 MG 24 hr tablet, TAKE 1 TABLET BY MOUTH ONCE A DAY, Disp: , Rfl:  .  nystatin (  MYCOSTATIN/NYSTOP) powder, Apply topically 4 (four) times daily., Disp: 30 g, Rfl: 0 .  ONETOUCH DELICA LANCETS 33G MISC, TEST TWICE DAILY, Disp: , Rfl:  .  [START ON 07/31/2017] oxyCODONE (OXY IR/ROXICODONE) 5 MG immediate release tablet, Take 1 tablet (5 mg total) by mouth every 8 (eight) hours as needed for severe pain., Disp: 90 tablet, Rfl: 0 .  [START ON 07/01/2017] oxyCODONE (OXY IR/ROXICODONE) 5 MG immediate release tablet, Take 1 tablet (5 mg total) by mouth every 8 (eight) hours as needed for severe pain., Disp: 90 tablet, Rfl: 0 .  telmisartan (MICARDIS) 80 MG tablet, TAKE 1 TABLET BY MOUTH ONCE A DAY, Disp: , Rfl:  .  Vitamin D, Ergocalciferol, (DRISDOL) 50000 units CAPS capsule, Take 50,000 Units by mouth every 7 (seven) days. , Disp: , Rfl:  .  [START ON  08/30/2017] oxyCODONE (OXY IR/ROXICODONE) 5 MG immediate release tablet, Take 1 tablet (5 mg total) by mouth every 8 (eight) hours as needed for severe pain., Disp: 90 tablet, Rfl: 0  ROS  Constitutional: Denies any fever or chills Gastrointestinal: No reported hemesis, hematochezia, vomiting, or acute GI distress Musculoskeletal: Denies any acute onset joint swelling, redness, loss of ROM, or weakness Neurological: No reported episodes of acute onset apraxia, aphasia, dysarthria, agnosia, amnesia, paralysis, loss of coordination, or loss of consciousness  Allergies  [name] is allergic to buspirone; citalopram; lisinopril; metformin; pravastatin; sitagliptin; tramadol; diltiazem hcl; gabapentin; hydralazine; and lovastatin.  PFSH  Drug: [name]  reports that she does not use drugs. Alcohol:  reports that she does not drink alcohol. Tobacco:  reports that she quit smoking about 4 years ago. Her smoking use included cigarettes. She has never used smokeless tobacco. Medical:  has a past medical history of Anemia, Anxiety, Aortic valve stenosis, Arthritis, B12 deficiency, Bowel obstruction (HCC), CKD (chronic kidney disease), Colostomy in place (HCC), Diabetes mellitus without complication (HCC), Diverticulitis large intestine, Dysrhythmia, Ectopic atrial tachycardia (HCC), FSGS (focal segmental glomerulosclerosis), Gastritis, GI bleed, Heart murmur, Hyperlipidemia, Hypertension, Hypothyroidism, Hypothyroidism, MRSA (methicillin resistant Staphylococcus aureus), Neuropathy, Neuropathy in diabetes (HCC), Obesity, Pedal edema, Psoriasis, Shortness of breath dyspnea, and Vertigo. Surgical: [name]  has a past surgical history that includes Colectomy; laparotomy closure of cecal perforation (05/09/2013); Tonsillectomy; Back surgery; Cardiac catheterization; Abdominal hysterectomy; Debridement of abdominal wall abscess (N/A, 02/22/2015); Excision mass abdominal (N/A, 02/24/2015); Application if wound vac  (N/A, 02/24/2015); Excision mass abdominal (N/A, 02/26/2015); Application if wound vac (N/A, 02/26/2015); Wound debridement (N/A, 03/03/2015); Wound debridement (N/A, 03/09/2015); Flexible sigmoidoscopy (06/19/2015); Colostomy reversal (N/A, 07/12/2015); laparotomy (07/12/2015); laparoscopy (07/12/2015); Lysis of adhesion (07/12/2015); laparotomy (N/A, 02/06/2015); and Colonoscopy with propofol (N/A, 09/04/2016). Family: family history includes CAD in her father; Colon cancer in her brother; Diabetes in her mother; Heart attack in her father; Hypertension in her father.  Constitutional Exam  General appearance: Well nourished, well developed, and well hydrated. In no apparent acute distress Vitals:   05/27/17 1405  BP: 138/76  Pulse: 76  Resp: 16  Temp: 98.2 F (36.8 C)  SpO2: 96%  Weight: 218 lb (98.9 kg)  Height: 5' 3" (1.6 m)  Psych/Mental status: Alert, oriented x 3 (person, place, & time)       Eyes: PERLA Respiratory: No evidence of acute respiratory distress  Cervical Spine Area Exam  Skin & Axial Inspection: No masses, redness, edema, swelling, or associated skin lesions Alignment: Symmetrical Functional ROM: Unrestricted ROM      Stability: No instability detected Muscle Tone/Strength: Functionally intact. No obvious neuro-muscular   anomalies detected. Sensory (Neurological): Unimpaired Palpation: Complains of area being tender to palpation              Upper Extremity (UE) Exam    Side: Right upper extremity  Side: Left upper extremity  Skin & Extremity Inspection: Skin color, temperature, and hair growth are WNL. No peripheral edema or cyanosis. No masses, redness, swelling, asymmetry, or associated skin lesions. No contractures.  Skin & Extremity Inspection: Skin color, temperature, and hair growth are WNL. No peripheral edema or cyanosis. No masses, redness, swelling, asymmetry, or associated skin lesions. No contractures.  Functional ROM: Adequate ROM          Functional ROM: Adequate  ROM          Muscle Tone/Strength: Functionally intact. No obvious neuro-muscular anomalies detected.  Muscle Tone/Strength: Movement possible against some resistance (4/5)  Sensory (Neurological): Unimpaired          Sensory (Neurological): Unimpaired          Palpation: No palpable anomalies              Palpation: No palpable anomalies              Specialized Test(s): Deferred         Specialized Test(s): Deferred          Gait & Posture Assessment  Ambulation: Unassisted Gait: Relatively normal for age and body habitus Posture: WNL   Lower Extremity Exam    Side: Right lower extremity  Side: Left lower extremity  Skin & Extremity Inspection: Skin color, temperature, and hair growth are WNL. No peripheral edema or cyanosis. No masses, redness, swelling, asymmetry, or associated skin lesions. No contractures.  Skin & Extremity Inspection: Skin color, temperature, and hair growth are WNL. No peripheral edema or cyanosis. No masses, redness, swelling, asymmetry, or associated skin lesions. No contractures.  Functional ROM: Unrestricted ROM          Functional ROM: Unrestricted ROM          Muscle Tone/Strength: Functionally intact. No obvious neuro-muscular anomalies detected.  Muscle Tone/Strength: Functionally intact. No obvious neuro-muscular anomalies detected.  Sensory (Neurological): Unimpaired  Sensory (Neurological): Unimpaired  Palpation: No palpable anomalies  Palpation: No palpable anomalies   Assessment  Primary Diagnosis & Pertinent Problem List: The primary encounter diagnosis was Myofascial pain. Diagnoses of Chronic neck pain (Bilateral) (L>R), Chronic pain of left upper extremity, Lumbar spondylosis, Chronic pain syndrome, and Long term prescription opiate use were also pertinent to this visit.  Status Diagnosis  Worsening Persistent Worsening 1. Myofascial pain   2. Chronic neck pain (Bilateral) (L>R)   3. Chronic pain of left upper extremity   4. Lumbar spondylosis    5. Chronic pain syndrome   6. Long term prescription opiate use     Problems updated and reviewed during this visit: Problem  Chronic Pain of Left Upper Extremity  Type 2 Diabetes Mellitus With Stage 4 Chronic Kidney Disease, With Long-Term Current Use of Insulin (Hcc)  Myofascial Pain  Morbid Obesity (Hcc)  Uncontrolled Type 2 Diabetes Mellitus With Hyperglycemia, With Long-Term Current Use of Insulin (Hcc)   Plan of Care  Pharmacotherapy (Medications Ordered): Meds ordered this encounter  Medications  . oxyCODONE (OXY IR/ROXICODONE) 5 MG immediate release tablet    Sig: Take 1 tablet (5 mg total) by mouth every 8 (eight) hours as needed for severe pain.    Dispense:  90 tablet    Refill:  0    Do   not place this medication, or any other prescription from our practice, on "Automatic Refill". Patient may have prescription filled one day early if pharmacy is closed on scheduled refill date. Do not fill until:08/30/2017 To last until: 09/29/2017    Order Specific Question:   Supervising Provider    Answer:   NAVEIRA, FRANCISCO [[id number]]  . oxyCODONE (OXY IR/ROXICODONE) 5 MG immediate release tablet    Sig: Take 1 tablet (5 mg total) by mouth every 8 (eight) hours as needed for severe pain.    Dispense:  90 tablet    Refill:  0    Do not place this medication, or any other prescription from our practice, on "Automatic Refill". Patient may have prescription filled one day early if pharmacy is closed on scheduled refill date. Do not fill until:07/31/2017 To last until: 08/30/2017    Order Specific Question:   Supervising Provider    Answer:   NAVEIRA, FRANCISCO [[id number]]  . oxyCODONE (OXY IR/ROXICODONE) 5 MG immediate release tablet    Sig: Take 1 tablet (5 mg total) by mouth every 8 (eight) hours as needed for severe pain.    Dispense:  90 tablet    Refill:  0    Do not place this medication, or any other prescription from our practice, on "Automatic Refill". Patient may have prescription  filled one day early if pharmacy is closed on scheduled refill date. Do not fill until:07/01/2017 To last until: 07/31/2017    Order Specific Question:   Supervising Provider    Answer:   NAVEIRA, FRANCISCO [[id number]]   New Prescriptions   No medications on file   Medications administered today: [name] had no medications administered during this visit. Lab-work, procedure(s), and/or referral(s): Orders Placed This Encounter  Procedures  . ToxASSURE Select 13 (MW), Urine   Imaging and/or referral(s): None  Interventional therapies: Planned, scheduled, and/or pending:  Not at this time.   Considering:  Diagnostic bilateral lumbar facet block  Possible bilateral lumbar facet rate frequency ablation.  Diagnostic bilateral celiac plexus block  Diagnostic bilateral intra-articular hip injection  Possible diagnostic bilateral femoral nerve and obturator nerve articular branch blocks  Possible bilateral femoral nerve and obturator nerve articular branch frequency ablation.  Diagnostic bilateral intra-articular knee joint injection  Possible series of 5 bilateral intra-articular Hyalgan knee injections.  Bilateral diagnostic genicular nerve block  Possible bilateral genicular nerve radiofrequency ablation    Palliative PRN treatment(s):  Diagnostic bilateral lumbar facet block  Diagnostic bilateral celiac plexus block  Diagnostic bilateral intra-articular hip injection  Possible diagnostic bilateral femoral nerve and obturator nerve articular branch blocks  Diagnostic bilateral intra-articular knee joint injection Bilateral diagnostic genicular nerve block      Provider-requested follow-up: Return in about 4 months (around 09/16/2017) for MedMgmt with Me (Crystal King).  Future Appointments  Date Time Provider Department Center  09/16/2017 12:45 PM King, Crystal M, NP [facility] None   Primary Care Physician: Klein, Bert J III, MD Location: [facility] Note by: Crystal M. King NP Date: 05/27/2017; Time: 3:27 PM  Pain Score Disclaimer: We use the NRS-11 scale. This is a self-reported, subjective measurement of pain severity with only modest accuracy. It is used primarily to identify changes within a particular patient. It must be understood that outpatient pain scales are significantly less accurate that those used for research, where they can be applied under ideal controlled circumstances with minimal exposure to variables. In reality, the score is likely to be a combination   of pain intensity and pain affect, where pain affect describes the degree of emotional arousal or changes in action readiness caused by the sensory experience of pain. Factors such as social and work situation, setting, emotional state, anxiety levels, expectation, and prior pain experience may influence pain perception and show large inter-individual differences that may also be affected by time variables.  Patient instructions provided during this appointment: Patient Instructions   ____________________________________________________________________________________________  Medication Rules  Applies to: All patients receiving prescriptions (written or electronic).  Pharmacy of record: Pharmacy where electronic prescriptions will be sent. If written prescriptions are taken to a different pharmacy, please inform the nursing staff. The pharmacy listed in the electronic medical record should be the one where you would like electronic prescriptions to be sent.  Prescription refills: Only during scheduled appointments. Applies to both, written and electronic prescriptions.  NOTE: The following applies primarily to controlled substances (Opioid* Pain Medications).   Patient's responsibilities: 1. Pain Pills: Bring all pain pills to every appointment (except for procedure appointments). 2. Pill Bottles: Bring pills in original pharmacy bottle. Always bring  newest bottle. Bring bottle, even if empty. 3. Medication refills: You are responsible for knowing and keeping track of what medications you need refilled. The day before your appointment, write a list of all prescriptions that need to be refilled. Bring that list to your appointment and give it to the admitting nurse. Prescriptions will be written only during appointments. If you forget a medication, it will not be "Called in", "Faxed", or "electronically sent". You will need to get another appointment to get these prescribed. 4. Prescription Accuracy: You are responsible for carefully inspecting your prescriptions before leaving our office. Have the discharge nurse carefully go over each prescription with you, before taking them home. Make sure that your name is accurately spelled, that your address is correct. Check the name and dose of your medication to make sure it is accurate. Check the number of pills, and the written instructions to make sure they are clear and accurate. Make sure that you are given enough medication to last until your next medication refill appointment. 5. Taking Medication: Take medication as prescribed. Never take more pills than instructed. Never take medication more frequently than prescribed. Taking less pills or less frequently is permitted and encouraged, when it comes to controlled substances (written prescriptions).  6. Inform other Doctors: Always inform, all of your healthcare providers, of all the medications you take. 7. Pain Medication from other Providers: You are not allowed to accept any additional pain medication from any other Doctor or Healthcare provider. There are two exceptions to this rule. (see below) In the event that you require additional pain medication, you are responsible for notifying us, as stated below. 8. Medication Agreement: You are responsible for carefully reading and following our Medication Agreement. This must be signed before receiving any  prescriptions from our practice. Safely store a copy of your signed Agreement. Violations to the Agreement will result in no further prescriptions. (Additional copies of our Medication Agreement are available upon request.) 9. Laws, Rules, & Regulations: All patients are expected to follow all Federal and State Laws, Statutes, Rules, & Regulations. Ignorance of the Laws does not constitute a valid excuse. The use of any illegal substances is prohibited. 10. Adopted CDC guidelines & recommendations: Target dosing levels will be at or below 60 MME/day. Use of benzodiazepines** is not recommended.  Exceptions: There are only two exceptions to the rule of not receiving pain medications from   other Healthcare Providers. 1. Exception #1 (Emergencies): In the event of an emergency (i.e.: accident requiring emergency care), you are allowed to receive additional pain medication. However, you are responsible for: As soon as you are able, call our office [phone], at any time of the day or night, and leave a message stating your name, the date and nature of the emergency, and the name and dose of the medication prescribed. In the event that your call is answered by a member of our staff, make sure to document and save the date, time, and the name of the person that took your information.  2. Exception #2 (Planned Surgery): In the event that you are scheduled by another doctor or dentist to have any type of surgery or procedure, you are allowed (for a period no longer than 30 days), to receive additional pain medication, for the acute post-op pain. However, in this case, you are responsible for picking up a copy of our "Post-op Pain Management for Surgeons" handout, and giving it to your surgeon or dentist. This document is available at our office, and does not require an appointment to obtain it. Simply go to our office during business hours (Monday-Thursday from 8:00 AM to 4:00 PM) (Friday 8:00 AM to 12:00 Noon) or  if you have a scheduled appointment with us, prior to your surgery, and ask for it by name. In addition, you will need to provide us with your name, name of your surgeon, type of surgery, and date of procedure or surgery.  *Opioid medications include: morphine, codeine, oxycodone, oxymorphone, hydrocodone, hydromorphone, meperidine, tramadol, tapentadol, buprenorphine, fentanyl, methadone. **Benzodiazepine medications include: diazepam (Valium), alprazolam (Xanax), clonazepam (Klonopine), lorazepam (Ativan), clorazepate (Tranxene), chlordiazepoxide (Librium), estazolam (Prosom), oxazepam (Serax), temazepam (Restoril), triazolam (Halcion) (Last updated: 04/17/2017) ____________________________________________________________________________________________    BMI Assessment: Estimated body mass index is 38.62 kg/m as calculated from the following:   Height as of this encounter: 5' 3" (1.6 m).   Weight as of this encounter: 218 lb (98.9 kg).  BMI interpretation table: BMI level Category Range association with higher incidence of chronic pain  <18 kg/m2 Underweight   18.5-24.9 kg/m2 Ideal body weight   25-29.9 kg/m2 Overweight Increased incidence by 20%  30-34.9 kg/m2 Obese (Class I) Increased incidence by 68%  35-39.9 kg/m2 Severe obesity (Class II) Increased incidence by 136%  >40 kg/m2 Extreme obesity (Class III) Increased incidence by 254%   BMI Readings from Last 4 Encounters:  05/27/17 38.62 kg/m  03/05/17 38.97 kg/m  12/03/16 38.79 kg/m  09/05/16 38.97 kg/m   Wt Readings from Last 4 Encounters:  05/27/17 218 lb (98.9 kg)  03/05/17 220 lb (99.8 kg)  12/03/16 219 lb (99.3 kg)  09/05/16 220 lb (99.8 kg)     

## 2017-05-27 NOTE — Patient Instructions (Signed)
____________________________________________________________________________________________  Medication Rules  Applies to: All patients receiving prescriptions (written or electronic).  Pharmacy of record: Pharmacy where electronic prescriptions will be sent. If written prescriptions are taken to a different pharmacy, please inform the nursing staff. The pharmacy listed in the electronic medical record should be the one where you would like electronic prescriptions to be sent.  Prescription refills: Only during scheduled appointments. Applies to both, written and electronic prescriptions.  NOTE: The following applies primarily to controlled substances (Opioid* Pain Medications).   Patient's responsibilities: 1. Pain Pills: Bring all pain pills to every appointment (except for procedure appointments). 2. Pill Bottles: Bring pills in original pharmacy bottle. Always bring newest bottle. Bring bottle, even if empty. 3. Medication refills: You are responsible for knowing and keeping track of what medications you need refilled. The day before your appointment, write a list of all prescriptions that need to be refilled. Bring that list to your appointment and give it to the admitting nurse. Prescriptions will be written only during appointments. If you forget a medication, it will not be "Called in", "Faxed", or "electronically sent". You will need to get another appointment to get these prescribed. 4. Prescription Accuracy: You are responsible for carefully inspecting your prescriptions before leaving our office. Have the discharge nurse carefully go over each prescription with you, before taking them home. Make sure that your name is accurately spelled, that your address is correct. Check the name and dose of your medication to make sure it is accurate. Check the number of pills, and the written instructions to make sure they are clear and accurate. Make sure that you are given enough medication to last  until your next medication refill appointment. 5. Taking Medication: Take medication as prescribed. Never take more pills than instructed. Never take medication more frequently than prescribed. Taking less pills or less frequently is permitted and encouraged, when it comes to controlled substances (written prescriptions).  6. Inform other Doctors: Always inform, all of your healthcare providers, of all the medications you take. 7. Pain Medication from other Providers: You are not allowed to accept any additional pain medication from any other Doctor or Healthcare provider. There are two exceptions to this rule. (see below) In the event that you require additional pain medication, you are responsible for notifying us, as stated below. 8. Medication Agreement: You are responsible for carefully reading and following our Medication Agreement. This must be signed before receiving any prescriptions from our practice. Safely store a copy of your signed Agreement. Violations to the Agreement will result in no further prescriptions. (Additional copies of our Medication Agreement are available upon request.) 9. Laws, Rules, & Regulations: All patients are expected to follow all Federal and State Laws, Statutes, Rules, & Regulations. Ignorance of the Laws does not constitute a valid excuse. The use of any illegal substances is prohibited. 10. Adopted CDC guidelines & recommendations: Target dosing levels will be at or below 60 MME/day. Use of benzodiazepines** is not recommended.  Exceptions: There are only two exceptions to the rule of not receiving pain medications from other Healthcare Providers. 1. Exception #1 (Emergencies): In the event of an emergency (i.e.: accident requiring emergency care), you are allowed to receive additional pain medication. However, you are responsible for: As soon as you are able, call our office [phone], at any time of the day or night, and leave a message stating your name, the  date and nature of the emergency, and the name and dose of the medication   prescribed. In the event that your call is answered by a member of our staff, make sure to document and save the date, time, and the name of the person that took your information.  2. Exception #2 (Planned Surgery): In the event that you are scheduled by another doctor or dentist to have any type of surgery or procedure, you are allowed (for a period no longer than 30 days), to receive additional pain medication, for the acute post-op pain. However, in this case, you are responsible for picking up a copy of our "Post-op Pain Management for Surgeons" handout, and giving it to your surgeon or dentist. This document is available at our office, and does not require an appointment to obtain it. Simply go to our office during business hours (Monday-Thursday from 8:00 AM to 4:00 PM) (Friday 8:00 AM to 12:00 Noon) or if you have a scheduled appointment with [place], prior to your surgery, and ask for it by name. In addition, you will need to provide [place] with your name, name of your surgeon, type of surgery, and date of procedure or surgery.  *Opioid medications include: morphine, codeine, oxycodone, oxymorphone, hydrocodone, hydromorphone, meperidine, tramadol, tapentadol, buprenorphine, fentanyl, methadone. **Benzodiazepine medications include: diazepam (Valium), alprazolam (Xanax), clonazepam (Klonopine), lorazepam (Ativan), clorazepate (Tranxene), chlordiazepoxide (Librium), estazolam (Prosom), oxazepam (Serax), temazepam (Restoril), triazolam (Halcion) (Last updated: 04/17/2017) ____________________________________________________________________________________________    BMI Assessment: Estimated body mass index is 38.62 kg/m as calculated from the following:   Height as of this encounter: 5\' 3"  (1.6 m).   Weight as of this encounter: 218 lb (98.9 kg).  BMI interpretation table: BMI level Category Range association with higher  incidence of chronic pain  <18 kg/m2 Underweight   18.5-24.9 kg/m2 Ideal body weight   25-29.9 kg/m2 Overweight Increased incidence by 20%  30-34.9 kg/m2 Obese (Class I) Increased incidence by 68%  35-39.9 kg/m2 Severe obesity (Class II) Increased incidence by 136%  >40 kg/m2 Extreme obesity (Class III) Increased incidence by 254%   BMI Readings from Last 4 Encounters:  05/27/17 38.62 kg/m  03/05/17 38.97 kg/m  12/03/16 38.79 kg/m  09/05/16 38.97 kg/m   Wt Readings from Last 4 Encounters:  05/27/17 218 lb (98.9 kg)  03/05/17 220 lb (99.8 kg)  12/03/16 219 lb (99.3 kg)  09/05/16 220 lb (99.8 kg)

## 2017-05-27 NOTE — Progress Notes (Signed)
Nursing Pain Medication Assessment:  Safety precautions to be maintained throughout the outpatient stay will include: orient to surroundings, keep bed in low position, maintain call bell within reach at all times, provide assistance with transfer out of bed and ambulation.  Medication Inspection Compliance: Pill count conducted under aseptic conditions, in front of the patient. Neither the pills nor the bottle was removed from the patient's sight at any time. Once count was completed pills were immediately returned to the patient in their original bottle.  Medication: See above Pill/Patch Count: 20 of 90 pills remain Pill/Patch Appearance: Markings consistent with prescribed medication Bottle Appearance: Standard pharmacy container. Clearly labeled. Filled Date: 3 / 15 / 2019 Last Medication intake:  Today

## 2017-05-31 LAB — TOXASSURE SELECT 13 (MW), URINE

## 2017-06-23 DIAGNOSIS — E1142 Type 2 diabetes mellitus with diabetic polyneuropathy: Secondary | ICD-10-CM

## 2017-06-23 DIAGNOSIS — B351 Tinea unguium: Secondary | ICD-10-CM | POA: Insufficient documentation

## 2017-06-23 DIAGNOSIS — E1169 Type 2 diabetes mellitus with other specified complication: Secondary | ICD-10-CM

## 2017-07-07 ENCOUNTER — Ambulatory Visit: Payer: Medicare Other | Admitting: Physical Therapy

## 2017-07-17 ENCOUNTER — Ambulatory Visit: Payer: Medicare Other | Attending: Internal Medicine | Admitting: Physical Therapy

## 2017-07-17 ENCOUNTER — Encounter: Payer: Self-pay | Admitting: Physical Therapy

## 2017-07-17 DIAGNOSIS — M6281 Muscle weakness (generalized): Secondary | ICD-10-CM | POA: Insufficient documentation

## 2017-07-17 DIAGNOSIS — M542 Cervicalgia: Secondary | ICD-10-CM | POA: Diagnosis not present

## 2017-07-17 DIAGNOSIS — M2560 Stiffness of unspecified joint, not elsewhere classified: Secondary | ICD-10-CM

## 2017-07-17 DIAGNOSIS — R293 Abnormal posture: Secondary | ICD-10-CM | POA: Diagnosis present

## 2017-07-17 NOTE — Therapy (Signed)
[facility] [address] [place], [place], [address] Phone: [phone]   Fax:  [phone]  Physical Therapy Evaluation  Patient Details  Name: [name] MRN: [id number] Date of Birth: October 21, 1950 Referring Provider: Dr. Ramonita Lab III   Encounter Date: 07/17/2017  PT End of Session - 07/17/17 0926    Visit Number  1    Number of Visits  8    Date for PT Re-Evaluation  08/14/17    PT Start Time  0926    PT Stop Time  1019    PT Time Calculation (min)  53 min    Activity Tolerance  Patient tolerated treatment well;Patient limited by pain    Behavior During Therapy  [facility] for tasks assessed/performed       Past Medical History:  Diagnosis Date  . Anemia   . Anxiety   . Aortic valve stenosis   . Arthritis    feet, legs  . B12 deficiency   . Bowel obstruction ([place])   . CKD (chronic kidney disease)    protein in urine  . Colostomy in place [facility])   . Diabetes mellitus without complication ([place])   . Diverticulitis large intestine   . Dysrhythmia   . Ectopic atrial tachycardia ([place])   . FSGS (focal segmental glomerulosclerosis)   . Gastritis   . GI bleed   . Heart murmur   . Hyperlipidemia   . Hypertension   . Hypothyroidism   . Hypothyroidism    unspecified  . MRSA (methicillin resistant Staphylococcus aureus)    at abdominal wound.  Jan 2017.  Treated.   . Neuropathy   . Neuropathy in diabetes ([place])   . Obesity   . Pedal edema   . Psoriasis   . Shortness of breath dyspnea   . Vertigo     Past Surgical History:  Procedure Laterality Date  . ABDOMINAL HYSTERECTOMY    . APPLICATION OF WOUND VAC N/A 02/24/2015   Procedure: APPLICATION OF WOUND VAC;  Surgeon: Clayburn Pert, MD;  Location: [facility] ORS;  Service: General;  Laterality: N/A;  . APPLICATION OF WOUND VAC N/A 02/26/2015   Procedure: APPLICATION OF WOUND VAC;  Surgeon: Clayburn Pert, MD;  Location: [facility] ORS;  Service: General;  Laterality: N/A;  . BACK  SURGERY     spur frmoved from lower back  . CARDIAC CATHETERIZATION    . COLECTOMY    . COLONOSCOPY WITH PROPOFOL N/A 09/04/2016   Procedure: COLONOSCOPY WITH PROPOFOL;  Surgeon: Manya Silvas, MD;  Location[facility] ENDOSCOPY;  Service: Endoscopy;  Laterality: N/A;  . COLOSTOMY REVERSAL N/A 07/12/2015   Procedure: COLOSTOMY REVERSAL;  Surgeon: Clayburn Pert, MD;  Location: [facility] ORS;  Service: General;  Laterality: N/A;  . DEBRIDEMENT OF ABDOMINAL WALL ABSCESS N/A 02/22/2015   Procedure: DEBRIDEMENT OF ABDOMINAL WALL ABSCESS;  Surgeon: Clayburn Pert, MD;  Location: [facility] ORS;  Service: General;  Laterality: N/A;  . EXCISION MASS ABDOMINAL N/A 02/24/2015   Procedure: EXCISION MASS ABDOMINAL  / [place];  Surgeon: Clayburn Pert, MD;  Location: [facility]S;  Service: General;  Laterality: N/A;  . EXCISION MASS ABDOMINAL N/A 02/26/2015   Procedure: EXCISION MASS ABDOMINAL/wash out;  Surgeon: Clayburn Pert, MD;  Location: [facility] ORS;  Service: General;  Laterality: N/A;  . FLEXIBLE SIGMOIDOSCOPY  06/19/2015   Procedure: FLEXIBLE SIGMOIDOSCOPY;  Surgeon: Lucilla Lame, MD;  Location: [place];  Service: Endoscopy;;  UNABLE TO ACCESS OSTOMY SITE FOR ACCESS INTO COLON  .  LAPAROSCOPY  07/12/2015   Procedure: LAPAROSCOPY DIAGNOSTIC;  Surgeon: Clayburn Pert, MD;  Location: [facility];  Service: General;;  . LAPAROTOMY  07/12/2015   Procedure: EXPLORATORY LAPAROTOMY;  Surgeon: Clayburn Pert, MD;  Location: [facility];  Service: General;;  . LAPAROTOMY N/A 02/06/2015   Procedure: Laparotomy, reduction of incarcerated parastomal hernia, repair of parastomal hernia with mesh;  Surgeon: Sherri Rad, MD;  Location: [facility]S;  Service: General;  Laterality: N/A;  . laparotomy closure of cecal perforation  05/09/2013   Dr. Marina Gravel  . LYSIS OF ADHESION  07/12/2015   Procedure: LYSIS OF ADHESION;  Surgeon: Clayburn Pert, MD;  Location: [facility];  Service: General;;  . TONSILLECTOMY    . WOUND DEBRIDEMENT N/A  03/03/2015   Procedure: DEBRIDEMENT ABDOMINAL WOUND;  Surgeon: Florene Glen, MD;  Location: [facility];  Service: General;  Laterality: N/A;  . WOUND DEBRIDEMENT N/A 03/09/2015   Procedure: DEBRIDEMENT ABDOMINAL WOUND;  Surgeon: Florene Glen, MD;  Location: [facility];  Service: General;  Laterality: N/A;    There were no vitals filed for this visit.   Subjective Assessment - 07/17/17 0925    Subjective  Pt. reports >1 year of neck pain (6/10)- L sided with UE involvement.  Pt. 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      Pertinent History  S/p ACDF 10+ years ago with improvement noted.  Pt. reports increase neck pain/ L shoulder pain about 1 year ago.  R handed.  Pt. enjoys coloring book/ read.  Muscle relaxer have been helping neck pain at night.    Limitations  Reading;Lifting;Standing;House hold activities    Patient Stated Goals  Increase neck movement with less pain.      Currently in Pain?  Yes    Pain Score  6     Pain Location  Neck    Pain Orientation  Left;Lower    Pain Descriptors / Indicators  Throbbing;Aching    Pain Type  Chronic pain    Pain Onset  More than a month ago    Pain Frequency  Constant    Aggravating Factors   household chores (increase neck mvmt.).  Pt. reports difficulty with standing tasks.           [facility] PT Assessment - 07/17/17 0001      Assessment   Medical Diagnosis  Neck Pain    Referring Provider  Dr. Ramonita Lab III    Onset Date/Surgical Date  06/18/16    Hand Dominance  Right    Next MD Visit  -- 6 weeks      Prior Function   Level of Independence  Independent with household mobility with device         See HEP    PT Education - 07/17/17 0926    Education provided  Yes    Education Details  See HEP    Person(s) Educated  Patient    Methods  Explanation;Handout;Demonstration    Comprehension  Verbalized understanding;Returned demonstration          PT Long Term Goals - 07/17/17 1250      PT LONG TERM GOAL #1   Title  Pt.  will increase cervical AROM to [facility] (L rotn./ R lat. flex.) to improve pain-free mobility with daily tasks.      Baseline  Cervical AROM:  flexion (52 deg.)- increase pain, ext. (32 deg.)- decrease pain, L rotn. (46 deg.), R rotn. (57 deg.), L lat. flex. (41 deg.), R lat. flex. (28  deg.)- "sharp" increase in L neck pain.     Time  4    Period  Weeks    Status  New    Target Date  08/14/17      PT LONG TERM GOAL #2   Title  Pt. will increase FOTO from 34 to 53 to improve pain-free mobility.      Baseline  FOTO baseline 34 on 5/30    Time  4    Period  Weeks    Status  New    Target Date  08/14/17      PT LONG TERM GOAL #3   Title  Pt. will demonstate improved upright posture/ head position with reading with no increase c/o pain.      Baseline  Significant thoracic kyphosis/ forward head posture.      Time  4    Period  Weeks    Status  New    Target Date  08/14/17      PT LONG TERM GOAL #4   Title  Pt. will report <3/10 L sided neck pain at worst with daily tasks/ caring for mother.       Baseline  >6/10 L sided neck pain currently at rest.      Time  4    Period  Weeks    Status  New    Target Date  08/14/17          Plan - 07/17/17 0926    Clinical Impression Statement  Pt. is a pleasant 67 y/o female with chronic h/o neck pain.  Pt. s/p ACDF in past (date unknown) with improvement reported.  Pt. concerned about having injections to neck due to steriods worsening blood sugar.  B shoulder AROM WFL (increase L sided neck pain with MMT/ end-range L sh. flexion and abd.).  B shoulder strength grossly 4/5 MMT.  Grip strength: L (30#), R (36.2#).  Cervical AROM:  flexion (52 deg.)- increase pain, ext. (32 deg.)- decrease pain, L rotn. (46 deg.), R rotn. (57 deg.), L lat. flex. (41 deg.), R lat. flex. (28 deg.)- "sharp" increase in L neck pain.  Significant thoracic kyphosis/ forward head posture in sitting and standing.  Several B UT trigger points noted with palpation and reports of L  hand numbness/ nerve involvement.  FOTO: baseline 34/ goal 53.  Pt. will benefit from skilled PT services to increase cervical AROM/ stability to improve posture/ pain-free mobility with daily tasks.          Clinical Presentation  Stable    Clinical Presentation due to:  Pt. is a sidesleeper L/R and sleeps with 2 pillows.      Clinical Decision Making  Low    Rehab Potential  Fair    PT Frequency  2x / week    PT Duration  4 weeks    PT Treatment/Interventions  ADLs/Self Care Home Management;Aquatic Therapy;Cryotherapy;Electrical Stimulation;Moist Heat;Functional mobility training;Therapeutic activities;Therapeutic exercise;Patient/family education;Neuromuscular re-education;Manual techniques;Dry needling;Passive range of motion       Patient will benefit from skilled therapeutic intervention in order to improve the following deficits and impairments:  Pain, Improper body mechanics, Postural dysfunction, Decreased mobility, Decreased activity tolerance, Decreased endurance, Decreased range of motion, Decreased strength, Hypomobility, Impaired flexibility  Visit Diagnosis: Neck pain on left side  Joint stiffness  Abnormal posture  Muscle weakness (generalized)     Problem List Patient Active Problem List   Diagnosis Date Noted  . Myofascial pain 05/27/2017  . Chronic pain of left upper  extremity 05/27/2017  . Morbid obesity ([place]) 05/06/2017  . Uncontrolled type 2 diabetes mellitus with hyperglycemia, with long-term current use of insulin ([place]) 02/07/2017  . Hyperlipidemia, mixed 08/08/2016  . Hx of adenomatous colonic polyps 06/14/2016  . 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 Chronic pain syndrome 03/11/2016  . Obesity, Class II, BMI 35-39.9 03/11/2016  . Osteopenia of multiple sites 01/26/2016  . Heart burn 12/18/2015  . Polyneuropathy 11/16/2015  . Lumbar spondylosis 09/25/2015  . Lumbar facet syndrome (Bilateral) (L>R) 09/25/2015  . Recurrent major  depressive disorder, in full remission ([place]) 09/19/2015  . Diabetic neuropathy ([place]) 09/06/2015  . H/O colostomy 07/12/2015  . Chronic abdominal pain (Location of Primary Source of Pain) (Bilateral) (L>R) 07/03/2015  . Long term current use of opiate analgesic 07/03/2015  . Long term prescription opiate use 07/03/2015  . Opiate use (22.5 MME/Day) 07/03/2015  . Encounter for therapeutic drug level monitoring 07/03/2015  . Encounter for pain management planning 07/03/2015  . Neurogenic pain 07/03/2015  . Musculoskeletal pain 07/03/2015  . Visceral abdominal pain 07/03/2015  . Diabetic peripheral neuropathy ([place]) 07/03/2015  . Chronic low back pain (Location of Secondary source of pain) (Bilateral) (L>R) 07/03/2015  . Chronic lower extremity pain (Location of Tertiary source of pain) (Bilateral) (R>L) 07/03/2015  . Chronic hip pain (Bilateral) (L>R) 07/03/2015  . Chronic knee pain (Bilateral) (L>R) 07/03/2015  . Chronic neck pain (Bilateral) (L>R) 07/03/2015  . Absolute anemia 02/16/2015  . Aortic heart valve narrowing 02/16/2015  . Arthritis 02/16/2015  . Chronic kidney disease (CKD), stage III (moderate) ([place]) 02/16/2015  . Focal and segmental hyalinosis 02/16/2015  . Bleeding gastrointestinal 02/16/2015  . Acquired atrophy of thyroid 02/16/2015  . Psoriasis 02/16/2015  . Small bowel obstruction ([place]) 02/04/2015  . Paroxysmal supraventricular tachycardia ([place]) 10/11/2014  . SOBOE (shortness of breath on exertion) 10/11/2014  . Benign essential HTN 10/04/2014  . Obesity 09/05/2014  . B12 deficiency 03/02/2014  . Type 2 diabetes mellitus with stage 4 chronic kidney disease, with long-term current use of insulin ([place]) 12/01/2013  . Abnormal presence of protein in urine 10/15/2010   Pura Spice, PT, DPT # [phone] 07/17/2017, 12:56 PM  [facility] [address] [place], [place], [address] Phone: [phone]   Fax:   [phone]  Name: [name] MRN: [id number] Date of Birth: [birth year]

## 2017-07-21 ENCOUNTER — Ambulatory Visit: Payer: Medicare Other | Attending: Internal Medicine | Admitting: Physical Therapy

## 2017-07-21 ENCOUNTER — Encounter: Payer: Self-pay | Admitting: Physical Therapy

## 2017-07-21 DIAGNOSIS — R293 Abnormal posture: Secondary | ICD-10-CM | POA: Diagnosis present

## 2017-07-21 DIAGNOSIS — M2560 Stiffness of unspecified joint, not elsewhere classified: Secondary | ICD-10-CM | POA: Insufficient documentation

## 2017-07-21 DIAGNOSIS — M542 Cervicalgia: Secondary | ICD-10-CM | POA: Diagnosis not present

## 2017-07-21 DIAGNOSIS — M6281 Muscle weakness (generalized): Secondary | ICD-10-CM | POA: Diagnosis present

## 2017-07-21 NOTE — Therapy (Signed)
[place] [facility] [address] [place], [place], [address] Phone: [phone]   Fax:  [phone]  Physical Therapy Treatment  Patient Details  Name: [name] MRN: [id number] Date of Birth: October 09, 1950 Referring Provider: Dr. Ramonita Lab III   Encounter Date: 07/21/2017  PT End of Session - 07/21/17 1307    Visit Number  2    Number of Visits  8    Date for PT Re-Evaluation  08/14/17    PT Start Time  1301    PT Stop Time  1356    PT Time Calculation (min)  55 min    Activity Tolerance  Patient tolerated treatment well;Patient limited by pain    Behavior During Therapy  [facility] for tasks assessed/performed       Past Medical History:  Diagnosis Date  . Anemia   . Anxiety   . Aortic valve stenosis   . Arthritis    feet, legs  . B12 deficiency   . Bowel obstruction ([place])   . CKD (chronic kidney disease)    protein in urine  . Colostomy in place [facility])   . Diabetes mellitus without complication ([place])   . Diverticulitis large intestine   . Dysrhythmia   . Ectopic atrial tachycardia ([place])   . FSGS (focal segmental glomerulosclerosis)   . Gastritis   . GI bleed   . Heart murmur   . Hyperlipidemia   . Hypertension   . Hypothyroidism   . Hypothyroidism    unspecified  . MRSA (methicillin resistant Staphylococcus aureus)    at abdominal wound.  Jan 2017.  Treated.   . Neuropathy   . Neuropathy in diabetes ([place])   . Obesity   . Pedal edema   . Psoriasis   . Shortness of breath dyspnea   . Vertigo     Past Surgical History:  Procedure Laterality Date  . ABDOMINAL HYSTERECTOMY    . APPLICATION OF WOUND VAC N/A 02/24/2015   Procedure: APPLICATION OF WOUND VAC;  Surgeon: Clayburn Pert, MD;  Location: [facility];  Service: General;  Laterality: N/A;  . APPLICATION OF WOUND VAC N/A 02/26/2015   Procedure: APPLICATION OF WOUND VAC;  Surgeon: Clayburn Pert, MD;  Location: [facility];  Service: General;  Laterality: N/A;  . BACK  SURGERY     spur frmoved from lower back  . CARDIAC CATHETERIZATION    . COLECTOMY    . COLONOSCOPY WITH PROPOFOL N/A 09/04/2016   Procedure: COLONOSCOPY WITH PROPOFOL;  Surgeon: Manya Silvas, MD;  Location: [facility] ENDOSCOPY;  Service: Endoscopy;  Laterality: N/A;  . COLOSTOMY REVERSAL N/A 07/12/2015   Procedure: COLOSTOMY REVERSAL;  Surgeon: Clayburn Pert, MD;  Location: [facility];  Service: General;  Laterality: N/A;  . DEBRIDEMENT OF ABDOMINAL WALL ABSCESS N/A 02/22/2015   Procedure: DEBRIDEMENT OF ABDOMINAL WALL ABSCESS;  Surgeon: Clayburn Pert, MD;  Location: [facility];  Service: General;  Laterality: N/A;  . EXCISION MASS ABDOMINAL N/A 02/24/2015   Procedure: EXCISION MASS ABDOMINAL  / [place];  Surgeon: Clayburn Pert, MD;  Location: [facility];  Service: General;  Laterality: N/A;  . EXCISION MASS ABDOMINAL N/A 02/26/2015   Procedure: EXCISION MASS ABDOMINAL/wash out;  Surgeon: Clayburn Pert, MD;  Location: [facility];  Service: General;  Laterality: N/A;  . FLEXIBLE SIGMOIDOSCOPY  06/19/2015   Procedure: FLEXIBLE SIGMOIDOSCOPY;  Surgeon: Lucilla Lame, MD;  Location: [place];  Service: Endoscopy;;  UNABLE TO ACCESS OSTOMY SITE FOR ACCESS INTO COLON  .  LAPAROSCOPY  07/12/2015   Procedure: LAPAROSCOPY DIAGNOSTIC;  Surgeon: Clayburn Pert, MD;  Location: [facility];  Service: General;;  . LAPAROTOMY  07/12/2015   Procedure: EXPLORATORY LAPAROTOMY;  Surgeon: Clayburn Pert, MD;  Location: [facility];  Service: General;;  . LAPAROTOMY N/A 02/06/2015   Procedure: Laparotomy, reduction of incarcerated parastomal hernia, repair of parastomal hernia with mesh;  Surgeon: Sherri Rad, MD;  Location: [facility];  Service: General;  Laterality: N/A;  . laparotomy closure of cecal perforation  05/09/2013   Dr. Marina Gravel  . LYSIS OF ADHESION  07/12/2015   Procedure: LYSIS OF ADHESION;  Surgeon: Clayburn Pert, MD;  Location: [facility];  Service: General;;  . TONSILLECTOMY    . WOUND DEBRIDEMENT N/A  03/03/2015   Procedure: DEBRIDEMENT ABDOMINAL WOUND;  Surgeon: Florene Glen, MD;  Location: [facility];  Service: General;  Laterality: N/A;  . WOUND DEBRIDEMENT N/A 03/09/2015   Procedure: DEBRIDEMENT ABDOMINAL WOUND;  Surgeon: Florene Glen, MD;  Location: [facility];  Service: General;  Laterality: N/A;    There were no vitals filed for this visit.  Subjective Assessment - 07/21/17 1304    Subjective  Pt. reports a lot of neck pain and L shoulder pain today.  Pt. c/o 7/10 neck pain prior to tx. session.  Pt. able to complete HEP but limited with neck flexion.      Pertinent History  S/p ACDF 10+ years ago with improvement noted.  Pt. reports increase neck pain/ L shoulder pain about 1 year ago.  R handed.  Pt. enjoys coloring book/ read.  Muscle relaxer have been helping neck pain at night.    Limitations  Reading;Lifting;Standing;House hold activities    Patient Stated Goals  Increase neck movement with less pain.      Currently in Pain?  Yes    Pain Score  7     Pain Location  Neck    Pain Orientation  Lower;Left    Pain Descriptors / Indicators  Aching;Throbbing    Pain Type  Chronic pain        Treatment:  Manual tx.:  Supine cervical AA/PROM and stretches all planes (as tolerated).   Supine cervical traction x4 with 30 sec holds STM in supine on posterior neck musculature 6 min. STM in sitting on upper trap   There.ex.:  Supine cervical isometric contractions in B side-bending and rotation 4x 10 sec. Holds. Supine cervical retractions with tactile and verbal cueing Supine B pec stretch x4 with 10 sec holds Supine B shoulder flexion with use of cane x4 with 10 second holds  Reviewed HEP  MH applied to cervical region after Tx for 8 min to relax muscles   PT Long Term Goals - 07/17/17 1250      PT LONG TERM GOAL #1   Title  Pt. will increase cervical AROM to [facility] (L rotn./ R lat. flex.) to improve pain-free mobility with daily tasks.      Baseline  Cervical AROM:   flexion (52 deg.)- increase pain, ext. (32 deg.)- decrease pain, L rotn. (46 deg.), R rotn. (57 deg.), L lat. flex. (41 deg.), R lat. flex. (28 deg.)- "sharp" increase in L neck pain.     Time  4    Period  Weeks    Status  New    Target Date  08/14/17      PT LONG TERM GOAL #2   Title  Pt. will increase FOTO from 34 to 53 to improve pain-free mobility.  Baseline  FOTO baseline 34 on 5/30    Time  4    Period  Weeks    Status  New    Target Date  08/14/17      PT LONG TERM GOAL #3   Title  Pt. will demonstate improved upright posture/ head position with reading with no increase c/o pain.      Baseline  Significant thoracic kyphosis/ forward head posture.      Time  4    Period  Weeks    Status  New    Target Date  08/14/17      PT LONG TERM GOAL #4   Title  Pt. will report <3/10 L sided neck pain at worst with daily tasks/ caring for mother.       Baseline  >6/10 L sided neck pain currently at rest.      Time  4    Period  Weeks    Status  New    Target Date  08/14/17         Plan - 07/21/17 1307    Clinical Impression Statement  Pt reported tenderness in upper traps and cervical region upon palpation.  Pt responded well to manual traction and soft tissue mobilization to cervical region and upper traps, reporting it to decrease pain.  Pt needed verbal and tactile cuing for proper cervical retraction exercise.  Pt is able to maintain upright posture in sitting with scapular retraction.  Pt educated on importance of posture during coloring and to correct posture when feeling slumped over.  Pt is able to correct posture upon verbal cueing.  Pt will continue to benefit from skilled therapy to increase neck strength and to increase tissue extensibility.    Clinical Presentation  Stable    Clinical Decision Making  Low    Rehab Potential  Fair    PT Frequency  2x / week    PT Duration  4 weeks    PT Treatment/Interventions  ADLs/Self Care Home Management;Aquatic  Therapy;Cryotherapy;Electrical Stimulation;Moist Heat;Functional mobility training;Therapeutic activities;Therapeutic exercise;Patient/family education;Neuromuscular re-education;Manual techniques;Dry needling;Passive range of motion    PT Next Visit Plan  Progress upright posture/ cervical strengthening/ isometrics.  ISSUE NEW EX.     PT Home Exercise Plan  see handouts.       Patient will benefit from skilled therapeutic intervention in order to improve the following deficits and impairments:  Pain, Improper body mechanics, Postural dysfunction, Decreased mobility, Decreased activity tolerance, Decreased endurance, Decreased range of motion, Decreased strength, Hypomobility, Impaired flexibility  Visit Diagnosis: Neck pain on left side  Joint stiffness  Abnormal posture  Muscle weakness (generalized)     Problem List Patient Active Problem List   Diagnosis Date Noted  . Myofascial pain 05/27/2017  . Chronic pain of left upper extremity 05/27/2017  . Morbid obesity ([place]) 05/06/2017  . Uncontrolled type 2 diabetes mellitus with hyperglycemia, with long-term current use of insulin ([place]) 02/07/2017  . Hyperlipidemia, mixed 08/08/2016  . Hx of adenomatous colonic polyps 06/14/2016  . 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 Chronic pain syndrome 03/11/2016  . Obesity, Class II, BMI 35-39.9 03/11/2016  . Osteopenia of multiple sites 01/26/2016  . Heart burn 12/18/2015  . Polyneuropathy 11/16/2015  . Lumbar spondylosis 09/25/2015  . Lumbar facet syndrome (Bilateral) (L>R) 09/25/2015  . Recurrent major depressive disorder, in full remission ([place]) 09/19/2015  . Diabetic neuropathy ([place]) 09/06/2015  . H/O colostomy 07/12/2015  . Chronic abdominal pain (Location of Primary Source of Pain) (  Bilateral) (L>R) 07/03/2015  . Long term current use of opiate analgesic 07/03/2015  . Long term prescription opiate use 07/03/2015  . Opiate use (22.5 MME/Day) 07/03/2015  .  Encounter for therapeutic drug level monitoring 07/03/2015  . Encounter for pain management planning 07/03/2015  . Neurogenic pain 07/03/2015  . Musculoskeletal pain 07/03/2015  . Visceral abdominal pain 07/03/2015  . Diabetic peripheral neuropathy ([place]) 07/03/2015  . Chronic low back pain (Location of Secondary source of pain) (Bilateral) (L>R) 07/03/2015  . Chronic lower extremity pain (Location of Tertiary source of pain) (Bilateral) (R>L) 07/03/2015  . Chronic hip pain (Bilateral) (L>R) 07/03/2015  . Chronic knee pain (Bilateral) (L>R) 07/03/2015  . Chronic neck pain (Bilateral) (L>R) 07/03/2015  . Absolute anemia 02/16/2015  . Aortic heart valve narrowing 02/16/2015  . Arthritis 02/16/2015  . Chronic kidney disease (CKD), stage III (moderate) ([place]) 02/16/2015  . Focal and segmental hyalinosis 02/16/2015  . Bleeding gastrointestinal 02/16/2015  . Acquired atrophy of thyroid 02/16/2015  . Psoriasis 02/16/2015  . Small bowel obstruction ([place]) 02/04/2015  . Paroxysmal supraventricular tachycardia ([place]) 10/11/2014  . SOBOE (shortness of breath on exertion) 10/11/2014  . Benign essential HTN 10/04/2014  . Obesity 09/05/2014  . B12 deficiency 03/02/2014  . Type 2 diabetes mellitus with stage 4 chronic kidney disease, with long-term current use of insulin ([place]) 12/01/2013  . Abnormal presence of protein in urine 10/15/2010   Pura Spice, PT, DPT # [phone] 07/21/2017, 5:49 PM  [facility] [address] [place], [place], [address] Phone: [phone]   Fax:  [phone]  Name: [name] MRN: [id number] Date of Birth: April 28, 1950

## 2017-07-24 ENCOUNTER — Emergency Department
Admission: EM | Admit: 2017-07-24 | Discharge: 2017-07-24 | Disposition: A | Discharge status: Home / Self Care | Payer: Medicare Other | Attending: Emergency Medicine | Admitting: Emergency Medicine

## 2017-07-24 ENCOUNTER — Encounter: Payer: Self-pay | Admitting: Emergency Medicine

## 2017-07-24 ENCOUNTER — Other Ambulatory Visit: Payer: Self-pay

## 2017-07-24 ENCOUNTER — Ambulatory Visit: Payer: Medicare Other | Admitting: Physical Therapy

## 2017-07-24 DIAGNOSIS — M5412 Radiculopathy, cervical region: Secondary | ICD-10-CM | POA: Insufficient documentation

## 2017-07-24 DIAGNOSIS — Z79899 Other long term (current) drug therapy: Secondary | ICD-10-CM | POA: Diagnosis not present

## 2017-07-24 DIAGNOSIS — Z794 Long term (current) use of insulin: Secondary | ICD-10-CM | POA: Insufficient documentation

## 2017-07-24 DIAGNOSIS — E1122 Type 2 diabetes mellitus with diabetic chronic kidney disease: Secondary | ICD-10-CM | POA: Diagnosis not present

## 2017-07-24 DIAGNOSIS — E1140 Type 2 diabetes mellitus with diabetic neuropathy, unspecified: Secondary | ICD-10-CM | POA: Diagnosis not present

## 2017-07-24 DIAGNOSIS — Z87891 Personal history of nicotine dependence: Secondary | ICD-10-CM | POA: Insufficient documentation

## 2017-07-24 DIAGNOSIS — I129 Hypertensive chronic kidney disease with stage 1 through stage 4 chronic kidney disease, or unspecified chronic kidney disease: Secondary | ICD-10-CM | POA: Diagnosis not present

## 2017-07-24 DIAGNOSIS — M542 Cervicalgia: Secondary | ICD-10-CM | POA: Diagnosis present

## 2017-07-24 DIAGNOSIS — N183 Chronic kidney disease, stage 3 (moderate): Secondary | ICD-10-CM | POA: Diagnosis not present

## 2017-07-24 DIAGNOSIS — E039 Hypothyroidism, unspecified: Secondary | ICD-10-CM | POA: Diagnosis not present

## 2017-07-24 LAB — COMPREHENSIVE METABOLIC PANEL
ALT: 14 U/L (ref 14–54)
ANION GAP: 10 (ref 5–15)
AST: 19 U/L (ref 15–41)
Albumin: 3.8 g/dL (ref 3.5–5.0)
Alkaline Phosphatase: 59 U/L (ref 38–126)
BUN: 45 mg/dL — ABNORMAL HIGH (ref 6–20)
CALCIUM: 9.1 mg/dL (ref 8.9–10.3)
CHLORIDE: 107 mmol/L (ref 101–111)
CO2: 21 mmol/L — AB (ref 22–32)
Creatinine, Ser: 2.42 mg/dL — ABNORMAL HIGH (ref 0.44–1.00)
GFR, EST AFRICAN AMERICAN: 23 mL/min — AB (ref >60)
GFR, EST NON AFRICAN AMERICAN: 20 mL/min — AB (ref >60)
Glucose, Bld: 148 mg/dL — ABNORMAL HIGH (ref 65–99)
Potassium: 4.8 mmol/L (ref 3.5–5.1)
SODIUM: 138 mmol/L (ref 135–145)
Total Bilirubin: 0.4 mg/dL (ref 0.3–1.2)
Total Protein: 7.3 g/dL (ref 6.5–8.1)

## 2017-07-24 LAB — CBC WITH DIFFERENTIAL/PLATELET
Basophils Absolute: 0.1 10*3/uL (ref 0–0.1)
Basophils Relative: 1 %
EOS ABS: 0.2 10*3/uL (ref 0–0.7)
EOS PCT: 2 %
HCT: 39.7 % (ref 35.0–47.0)
Hemoglobin: 12.8 g/dL (ref 12.0–16.0)
LYMPHS ABS: 1.1 10*3/uL (ref 1.0–3.6)
Lymphocytes Relative: 12 %
MCH: 26.7 pg (ref 26.0–34.0)
MCHC: 32.2 g/dL (ref 32.0–36.0)
MCV: 82.7 fL (ref 80.0–100.0)
MONO ABS: 0.6 10*3/uL (ref 0.2–0.9)
MONOS PCT: 7 %
Neutro Abs: 7.6 10*3/uL — ABNORMAL HIGH (ref 1.4–6.5)
Neutrophils Relative %: 78 %
PLATELETS: 239 10*3/uL (ref 150–440)
RBC: 4.8 MIL/uL (ref 3.80–5.20)
RDW: 15.9 % — ABNORMAL HIGH (ref 11.5–14.5)
WBC: 9.7 10*3/uL (ref 3.6–11.0)

## 2017-07-24 MED ORDER — LIDOCAINE 5 % EX PTCH: 1.0000 | MEDICATED_PATCH | Freq: Two times a day (BID) | CUTANEOUS | 0 refills | Status: DC

## 2017-07-24 NOTE — Discharge Instructions (Addendum)
Please seek medical attention for any high fevers, chest pain, shortness of breath, change in behavior, persistent vomiting, bloody stool or any other new or concerning symptoms.  

## 2017-07-24 NOTE — ED Triage Notes (Signed)
Pt reports that she has had neck pain for over a year and she has been going to Physical therapy, she was suppose to go today and was hurting so bad she cancelled her appointment. She reports that Dr. Cleda Mccreedy told her that her arm tingling that started a month ago is related to her neck pain. She reports that she is here today for the pain and the numbness in her hand.

## 2017-07-24 NOTE — ED Notes (Signed)
AAOx3.  Skin warm and dry.  NAD 

## 2017-07-24 NOTE — ED Provider Notes (Signed)
[facility] Emergency Department Provider Note  ___________________________________   I have reviewed the triage vital signs and the nursing notes.   HISTORY  Chief Complaint Neck Pain and Tingling   History limited by: Not Limited   HPI [name] is a 67 y.o. female who presents to the emergency department today because of concerns for neck pain and bilateral arm numbness and tingling.  These problems are chronic in nature.  They have been progressive.  She denies any new symptoms today.  She just came to the emergency department to see if we could offer any help.  She is on narcotics for this but states that they are no longer as effective as they used to be.  She does follow-up with her primary care physician for this and has seen physical therapy for this.  Per medical record review patient has a history of neck pain and arm tingling numbness for which she has been seeing primary care.   Past Medical History:  Diagnosis Date  . Anemia   . Anxiety   . Aortic valve stenosis   . Arthritis    feet, legs  . B12 deficiency   . Bowel obstruction ([place])   . CKD (chronic kidney disease)    protein in urine  . Colostomy in place [facility])   . Diabetes mellitus without complication ([place])   . Diverticulitis large intestine   . Dysrhythmia   . Ectopic atrial tachycardia ([place])   . FSGS (focal segmental glomerulosclerosis)   . Gastritis   . GI bleed   . Heart murmur   . Hyperlipidemia   . Hypertension   . Hypothyroidism   . Hypothyroidism    unspecified  . MRSA (methicillin resistant Staphylococcus aureus)    at abdominal wound.  Jan 2017.  Treated.   . Neuropathy   . Neuropathy in diabetes ([place])   . Obesity   . Pedal edema   . Psoriasis   . Shortness of breath dyspnea   . Vertigo     Patient Active Problem List   Diagnosis Date Noted  . Myofascial pain 05/27/2017  . Chronic pain of left upper extremity 05/27/2017  . Morbid obesity ([place])  05/06/2017  . Uncontrolled type 2 diabetes mellitus with hyperglycemia, with long-term current use of insulin ([place]) 02/07/2017  . Hyperlipidemia, mixed 08/08/2016  . Hx of adenomatous colonic polyps 06/14/2016  . 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 Chronic pain syndrome 03/11/2016  . Obesity, Class II, BMI 35-39.9 03/11/2016  . Osteopenia of multiple sites 01/26/2016  . Heart burn 12/18/2015  . Polyneuropathy 11/16/2015  . Lumbar spondylosis 09/25/2015  . Lumbar facet syndrome (Bilateral) (L>R) 09/25/2015  . Recurrent major depressive disorder, in full remission ([place]) 09/19/2015  . Diabetic neuropathy ([place]) 09/06/2015  . H/O colostomy 07/12/2015  . Chronic abdominal pain (Location of Primary Source of Pain) (Bilateral) (L>R) 07/03/2015  . Long term current use of opiate analgesic 07/03/2015  . Long term prescription opiate use 07/03/2015  . Opiate use (22.5 MME/Day) 07/03/2015  . Encounter for therapeutic drug level monitoring 07/03/2015  . Encounter for pain management planning 07/03/2015  . Neurogenic pain 07/03/2015  . Musculoskeletal pain 07/03/2015  . Visceral abdominal pain 07/03/2015  . Diabetic peripheral neuropathy ([place]) 07/03/2015  . Chronic low back pain (Location of Secondary source of pain) (Bilateral) (L>R) 07/03/2015  . Chronic lower extremity pain (Location of Tertiary source of pain) (Bilateral) (R>L) 07/03/2015  . Chronic hip pain (Bilateral) (L>R) 07/03/2015  .  Chronic knee pain (Bilateral) (L>R) 07/03/2015  . Chronic neck pain (Bilateral) (L>R) 07/03/2015  . Absolute anemia 02/16/2015  . Aortic heart valve narrowing 02/16/2015  . Arthritis 02/16/2015  . Chronic kidney disease (CKD), stage III (moderate) ([place]) 02/16/2015  . Focal and segmental hyalinosis 02/16/2015  . Bleeding gastrointestinal 02/16/2015  . Acquired atrophy of thyroid 02/16/2015  . Psoriasis 02/16/2015  . Small bowel obstruction ([place]) 02/04/2015  . Paroxysmal  supraventricular tachycardia ([place]) 10/11/2014  . SOBOE (shortness of breath on exertion) 10/11/2014  . Benign essential HTN 10/04/2014  . Obesity 09/05/2014  . B12 deficiency 03/02/2014  . Type 2 diabetes mellitus with stage 4 chronic kidney disease, with long-term current use of insulin ([place]) 12/01/2013  . Abnormal presence of protein in urine 10/15/2010    Past Surgical History:  Procedure Laterality Date  . ABDOMINAL HYSTERECTOMY    . APPLICATION OF WOUND VAC N/A 02/24/2015   Procedure: APPLICATION OF WOUND VAC;  Surgeon: Clayburn Pert, MD;  Location: [facility] ORS;  Service: General;  Laterality: N/A;  . APPLICATION OF WOUND VAC N/A 02/26/2015   Procedure: APPLICATION OF WOUND VAC;  Surgeon: Clayburn Pert, MD;  Location: [facility] ORS;  Service: General;  Laterality: N/A;  . BACK SURGERY     spur frmoved from lower back  . CARDIAC CATHETERIZATION    . COLECTOMY    . COLONOSCOPY WITH PROPOFOL N/A 09/04/2016   Procedure: COLONOSCOPY WITH PROPOFOL;  Surgeon: Manya Silvas, MD;  Location: [facility] END[facility]COPY;  Service: Endoscopy;  Laterality: N/A;  . COLOSTOMY REVERSAL N/A 07/12/2015   Procedure: COLOSTOMY REVERSAL;  Surgeon: Clayburn Pert, MD;  Location: [facility] ORS;  Service: General;  Laterality: N/A;  . DEBRIDEMENT OF ABDOMINAL WALL ABSCESS N/A 02/22/2015   Procedure: DEBRIDEMENT OF ABDOMINAL WALL ABSCESS;  Surgeon: Clayburn Pert, MD;  Location: [facility] ORS;  Service: General;  Laterality: N/A;  . EXCISION MASS ABDOMINAL N/A 02/24/2015   Procedure: EXCISION MASS ABDOMINAL  / [place];  Surgeon: Clayburn Pert, MD;  Location: [facility] ORS;  Service: General;  Laterality: N/A;  . EXCISION MASS ABDOMINAL N/A 02/26/2015   Procedure: EXCISION MASS ABDOMINAL/wash out;  Surgeon: Clayburn Pert, MD;  Location: [facility] ORS;  Service: General;  Laterality: N/A;  . FLEXIBLE SIGMOIDOSCOPY  06/19/2015   Procedure: FLEXIBLE SIGMOIDOSCOPY;  Surgeon: Lucilla Lame, MD;  Location: [place];  Service: Endoscopy;;   UNABLE TO ACCESS OSTOMY SITE FOR ACCESS INTO COLON  . LAPAROSCOPY  07/12/2015   Procedure: LAPAROSCOPY DIAGNOSTIC;  Surgeon: Clayburn Pert, MD;  Location: [facility] ORS;  Service: General;;  . LAPAROTOMY  07/12/2015   Procedure: EXPLORATORY LAPAROTOMY;  Surgeon: Clayburn Pert, MD;  Location: [facility] ORS;  Service: General;;  . LAPAROTOMY N/A 02/06/2015   Procedure: Laparotomy, reduction of incarcerated parastomal hernia, repair of parastomal hernia with mesh;  Surgeon: Sherri Rad, MD;  Location: [facility] ORS;  Service: General;  Laterality: N/A;  . laparotomy closure of cecal perforation  05/09/2013   Dr. Marina Gravel  . LYSIS OF ADHESION  07/12/2015   Procedure: LYSIS OF ADHESION;  Surgeon: Clayburn Pert, MD;  Location: [facility] ORS;  Service: General;;  . TONSILLECTOMY    . WOUND DEBRIDEMENT N/A 03/03/2015   Procedure: DEBRIDEMENT ABDOMINAL WOUND;  Surgeon: Florene Glen, MD;  Location: [facility] ORS;  Service: General;  Laterality: N/A;  . WOUND DEBRIDEMENT N/A 03/09/2015   Procedure: DEBRIDEMENT ABDOMINAL WOUND;  Surgeon: Florene Glen, MD;  Location: [facility] ORS;  Service: General;  Laterality: N/A;    Prior to Admission medications  Medication Sig Start Date End Date Taking? Authorizing Provider  amLODipine (NORVASC) 5 MG tablet Take 1 tablet (5 mg total) by mouth daily. 07/22/15   Florene Glen, MD  atorvastatin (LIPITOR) 40 MG tablet Take 40 mg by mouth daily at 6 PM.  04/19/15   [provider]  calcitRIOL (ROCALTROL) 0.25 MCG capsule Take 1 capsule by mouth every other day. 12/22/15   [provider]  cetirizine (ZYRTEC) 10 MG tablet Take 10 mg by mouth daily. 04/28/17   [provider]  clobetasol cream (TEMOVATE) 4.33 % Apply 1 application topically daily. 12/29/15   [provider]  DULoxetine (CYMBALTA) 30 MG capsule Take 30 mg by mouth daily.  01/18/15   [provider]  Ergocalciferol (VITAMIN D2) 2000 units TABS Take 1.25 mg by mouth once a week.      [provider]  glimepiride (AMARYL) 4 MG tablet Take 2 mg by mouth 2 (two) times daily.  08/12/14   [provider]  glucose blood (ONE TOUCH ULTRA TEST) test strip TEST TWICE DAILY 09/06/14   [provider]  glucose blood (ONE TOUCH ULTRA TEST) test strip TEST TWICE DAILY 09/16/16   [provider]  insulin glargine (LANTUS) 100 UNIT/ML injection Inject 24 Units into the skin at bedtime.     [provider]  insulin lispro (HUMALOG KWIKPEN) 100 UNIT/ML KiwkPen Take up to 50 units daily in divided doses, as directed. 11/27/16   [provider]  levothyroxine (SYNTHROID, LEVOTHROID) 88 MCG tablet TAKE 1 TABLET BY MOUTH ONCE A DAY FOR THYROID 09/11/15   [provider]  metoprolol succinate (TOPROL-XL) 25 MG 24 hr tablet TAKE 1 TABLET BY MOUTH ONCE A DAY 07/24/15   [provider]  nystatin (MYCOSTATIN/NYSTOP) powder Apply topically 4 (four) times daily. 02/26/16   Clayburn Pert, MD  [facility] DELICA LANCETS 29J [place] TEST TWICE DAILY 09/06/14   [provider]  oxyCODONE (OXY IR/ROXICODONE) 5 MG immediate release tablet Take 1 tablet (5 mg total) by mouth every 8 (eight) hours as needed for severe pain. 08/30/17 09/29/17  Vevelyn Francois, NP  oxyCODONE (OXY IR/ROXICODONE) 5 MG immediate release tablet Take 1 tablet (5 mg total) by mouth every 8 (eight) hours as needed for severe pain. 07/31/17 08/30/17  Vevelyn Francois, NP  oxyCODONE (OXY IR/ROXICODONE) 5 MG immediate release tablet Take 1 tablet (5 mg total) by mouth every 8 (eight) hours as needed for severe pain. 07/01/17 07/31/17  Vevelyn Francois, NP  telmisartan (MICARDIS) 80 MG tablet TAKE 1 TABLET BY MOUTH ONCE A DAY 03/06/15   [provider]  Vitamin D, Ergocalciferol, (DRISDOL) 50000 units CAPS capsule Take 50,000 Units by mouth every 7 (seven) days.     [provider]    Allergies Buspirone; Citalopram; Lisinopril; Metformin; Pravastatin; Sitagliptin;  Tramadol; Diltiazem hcl; Gabapentin; Hydralazine; and Lovastatin  Family History  Problem Relation Age of Onset  . Diabetes Mother   . Hypertension Father   . CAD Father   . Heart attack Father   . Colon cancer Brother     Social History Social History   Tobacco Use  . Smoking status: Former Smoker    Types: Cigarettes    Last attempt to quit: 04/18/2013    Years since quitting: 4.2  . Smokeless tobacco: Never Used  Substance Use Topics  . Alcohol use: No    Alcohol/week: 0.0 oz  . Drug use: No    Review of Systems Constitutional:  No fever/chills Eyes: No visual changes. ENT: No sore throat. Cardiovascular: Denies chest pain. Respiratory: Denies shortness of breath. Gastrointestinal: No abdominal pain.  No nausea, no vomiting.  No diarrhea.   Genitourinary: Negative for dysuria. Musculoskeletal: Positive for neck pain. Skin: Negative for rash. Neurological: Positive for bilateral arm numbness.   ____________________________________________   PHYSICAL EXAM:  VITAL SIGNS: ED Triage Vitals  Enc Vitals Group     BP 07/24/17 1409 134/71     Pulse Rate 07/24/17 1409 76     Resp 07/24/17 1409 18     Temp 07/24/17 1409 98.2 F (36.8 C)     Temp Source 07/24/17 1409 Oral     SpO2 07/24/17 1409 96 %     Weight 07/24/17 1410 216 lb (98 kg)     Height 07/24/17 1410 5\' 3"  (1.6 m)     Head Circumference --      Peak Flow --      Pain Score 07/24/17 1409 9   Constitutional: Alert and oriented.  Eyes: Conjunctivae are normal.  ENT      Head: Normocephalic and atraumatic.      Nose: No congestion/rhinnorhea.      Mouth/Throat: Mucous membranes are moist.      Neck: No stridor. Hematological/Lymphatic/Immunilogical: No cervical lymphadenopathy. Cardiovascular: Normal rate, regular rhythm.   Respiratory: Normal respiratory effort without tachypnea nor retractions. Breath sounds are clear and equal bilaterally. No wheezes/rales/rhonchi. Gastrointestinal: Soft and non  tender. No rebound. No guarding.  Genitourinary: Deferred Musculoskeletal: Normal range of motion in all extremities. No lower extremity edema. Neurologic:  Normal speech and language. No gross focal neurologic deficits are appreciated.  Skin:  Skin is warm, dry and intact. No rash noted. Psychiatric: Mood and affect are normal. Speech and behavior are normal. Patient exhibits appropriate insight and judgment.  ____________________________________________    LABS (pertinent positives/negatives)  CBC wbc 9.7, hgb 12.8, plt 239 CMP na 138, k 4.8, glu 148, cr 2.42  ____________________________________________   EKG  None  ____________________________________________    RADIOLOGY  None  ____________________________________________   PROCEDURES  Procedures  ____________________________________________   INITIAL IMPRESSION / ASSESSMENT AND PLAN / ED COURSE  Pertinent labs & imaging results that were available during my care of the patient were reviewed by me and considered in my medical decision making (see chart for details).   Patient presented to the emergency department today because of concerns for continued neck pain and arm numbness.  This is a chronic issue for the patient.  The patient is working both with physical therapy and her primary care doctor for this.  Discussed with patient that we would not be able to offer definitive treatment here in the emergency department.  Will have her give her neurosurgery follow-up information.  Discussed with patient importance of continued primary care follow-up.  Also give patient prescription for lidocaine patch to see if that helps alleviate some of the patient's discomfort.   ____________________________________________   FINAL CLINICAL IMPRESSION(S) / ED DIAGNOSES  Final diagnoses:  Cervical radiculopathy     Note: This dictation was prepared with Dragon dictation. Any transcriptional errors that result from this  process are unintentional     Nance Pear, MD 07/24/17 1623

## 2017-07-28 ENCOUNTER — Encounter: Payer: Self-pay | Admitting: Physical Therapy

## 2017-07-28 ENCOUNTER — Ambulatory Visit: Payer: Medicare Other | Admitting: Physical Therapy

## 2017-07-28 DIAGNOSIS — M542 Cervicalgia: Secondary | ICD-10-CM | POA: Diagnosis not present

## 2017-07-28 DIAGNOSIS — R293 Abnormal posture: Secondary | ICD-10-CM

## 2017-07-28 DIAGNOSIS — M2560 Stiffness of unspecified joint, not elsewhere classified: Secondary | ICD-10-CM

## 2017-07-28 DIAGNOSIS — M6281 Muscle weakness (generalized): Secondary | ICD-10-CM

## 2017-07-28 NOTE — Therapy (Signed)
[facility] [address]. [place], [place], [address] Phone: [phone]   Fax:  [phone]  Physical Therapy Treatment  Patient Details  Name: [name] MRN: [id number] Date of Birth: Nov 19, 1950 Referring Provider: Dr. Ramonita Lab III   Encounter Date: 07/28/2017  PT End of Session - 07/28/17 1437    Visit Number  3    Number of Visits  8    Date for PT Re-Evaluation  08/14/17    PT Start Time  7628    PT Stop Time  1356    PT Time Calculation (min)  54 min    Activity Tolerance  Patient tolerated treatment well;Patient limited by pain    Behavior During Therapy  [facility] for tasks assessed/performed       Past Medical History:  Diagnosis Date  . Anemia   . Anxiety   . Aortic valve stenosis   . Arthritis    feet, legs  . B12 deficiency   . Bowel obstruction ([place])   . CKD (chronic kidney disease)    protein in urine  . Colostomy in place [facility])   . Diabetes mellitus without complication ([place])   . Diverticulitis large intestine   . Dysrhythmia   . Ectopic atrial tachycardia ([place])   . FSGS (focal segmental glomerulosclerosis)   . Gastritis   . GI bleed   . Heart murmur   . Hyperlipidemia   . Hypertension   . Hypothyroidism   . Hypothyroidism    unspecified  . MRSA (methicillin resistant Staphylococcus aureus)    at abdominal wound.  Jan 2017.  Treated.   . Neuropathy   . Neuropathy in diabetes ([place])   . Obesity   . Pedal edema   . Psoriasis   . Shortness of breath dyspnea   . Vertigo     Past Surgical History:  Procedure Laterality Date  . ABDOMINAL HYSTERECTOMY    . APPLICATION OF WOUND VAC N/A 02/24/2015   Procedure: APPLICATION OF WOUND VAC;  Surgeon: Clayburn Pert, MD;  Location: [facility] ORS;  Service: General;  Laterality: N/A;  . APPLICATION OF WOUND VAC N/A 02/26/2015   Procedure: APPLICATION OF WOUND VAC;  Surgeon: Clayburn Pert, MD;  Location: [facility] ORS;  Service: General;  Laterality: N/A;  . BACK  SURGERY     spur frmoved from lower back  . CARDIAC CATHETERIZATION    . COLECTOMY    . COLONOSCOPY WITH PROPOFOL N/A 09/04/2016   Procedure: COLONOSCOPY WITH PROPOFOL;  Surgeon: Manya Silvas, MD;  Location: [facility] ENDOSCOPY;  Service: Endoscopy;  Laterality: N/A;  . COLOSTOMY REVERSAL N/A 07/12/2015   Procedure: COLOSTOMY REVERSAL;  Surgeon: Clayburn Pert, MD;  Location: [facility] ORS;  Service: General;  Laterality: N/A;  . DEBRIDEMENT OF ABDOMINAL WALL ABSCESS N/A 02/22/2015   Procedure: DEBRIDEMENT OF ABDOMINAL WALL ABSCESS;  Surgeon: Clayburn Pert, MD;  Location: [facility]S;  Service: General;  Laterality: N/A;  . EXCISION MASS ABDOMINAL N/A 02/24/2015   Procedure: EXCISION MASS ABDOMINAL  / [place];  Surgeon: Clayburn Pert, MD;  Location: [facility];  Service: General;  Laterality: N/A;  . EXCISION MASS ABDOMINAL N/A 02/26/2015   Procedure: EXCISION MASS ABDOMINAL/wash out;  Surgeon: Clayburn Pert, MD;  Location: [facility];  Service: General;  Laterality: N/A;  . FLEXIBLE SIGMOIDOSCOPY  06/19/2015   Procedure: FLEXIBLE SIGMOIDOSCOPY;  Surgeon: Lucilla Lame, MD;  Location: [place];  Service: Endoscopy;;  UNABLE TO ACCESS OSTOMY SITE FOR ACCESS INTO COLON  .  LAPAROSCOPY  07/12/2015   Procedure: LAPAROSCOPY DIAGNOSTIC;  Surgeon: Clayburn Pert, MD;  Location: [facility];  Service: General;;  . LAPAROTOMY  07/12/2015   Procedure: EXPLORATORY LAPAROTOMY;  Surgeon: Clayburn Pert, MD;  Location: [facility];  Service: General;;  . LAPAROTOMY N/A 02/06/2015   Procedure: Laparotomy, reduction of incarcerated parastomal hernia, repair of parastomal hernia with mesh;  Surgeon: Sherri Rad, MD;  Location: [facility];  Service: General;  Laterality: N/A;  . laparotomy closure of cecal perforation  05/09/2013   Dr. Marina Gravel  . LYSIS OF ADHESION  07/12/2015   Procedure: LYSIS OF ADHESION;  Surgeon: Clayburn Pert, MD;  Location: [facility];  Service: General;;  . TONSILLECTOMY    . WOUND DEBRIDEMENT N/A  03/03/2015   Procedure: DEBRIDEMENT ABDOMINAL WOUND;  Surgeon: Florene Glen, MD;  Location: [facility];  Service: General;  Laterality: N/A;  . WOUND DEBRIDEMENT N/A 03/09/2015   Procedure: DEBRIDEMENT ABDOMINAL WOUND;  Surgeon: Florene Glen, MD;  Location: [facility];  Service: General;  Laterality: N/A;    There were no vitals filed for this visit.  Subjective Assessment - 07/28/17 1433    Subjective  Pt. reports that ED visit was not beneficial, and she was concerned with possibility of a stroke due to numbness in B hands.  Blood was drawn and pt reports no significant findings were made.  Pt reports numbness has not resolved.  Pt. reports 7/10 pain upon arrival and has been performing HEP at home.    Pertinent History  S/p ACDF 10+ years ago with improvement noted.  Pt. reports increase neck pain/ L shoulder pain about 1 year ago.  R handed.  Pt. enjoys coloring book/ read.  Muscle relaxer have been helping neck pain at night.    Limitations  Reading;Lifting;Standing;House hold activities    Patient Stated Goals  Increase neck movement with less pain.      Currently in Pain?  Yes    Pain Score  7     Pain Location  Neck    Pain Orientation  Left    Pain Descriptors / Indicators  Aching    Pain Type  Chronic pain    Pain Onset  More than a month ago    Pain Frequency  Constant           There Ex:  Supine: Thoracic rotation with ball 2x10 B Shoulder flexion with wand 2x10 Cervical retractions with consistent verbal cueing for proper technique 2x10  Patient still struggles with cervical retractions and understanding proper movement.  Continue to educate pt in this movement which is listed on HEP.   Manual: STM to UT 2x30sec STM to Cervical region 2x30sec Traction which pt reported alleviated pain 2x30sec Cervical PROM with overpressure in all planes of movement to tolerance 2x30sec  Assessed L/R ULTT (no limitations noted).    MH was placed on contralateral side of  STM and switched when going to other UE       PT Long Term Goals - 07/17/17 1250      PT LONG TERM GOAL #1   Title  Pt. will increase cervical AROM to [facility] (L rotn./ R lat. flex.) to improve pain-free mobility with daily tasks.      Baseline  Cervical AROM:  flexion (52 deg.)- increase pain, ext. (32 deg.)- decrease pain, L rotn. (46 deg.), R rotn. (57 deg.), L lat. flex. (41 deg.), R lat. flex. (28 deg.)- "sharp" increase in L neck pain.     Time  4    Period  Weeks    Status  New    Target Date  08/14/17      PT LONG TERM GOAL #2   Title  Pt. will increase FOTO from 34 to 53 to improve pain-free mobility.      Baseline  FOTO baseline 34 on 5/30    Time  4    Period  Weeks    Status  New    Target Date  08/14/17      PT LONG TERM GOAL #3   Title  Pt. will demonstate improved upright posture/ head position with reading with no increase c/o pain.      Baseline  Significant thoracic kyphosis/ forward head posture.      Time  4    Period  Weeks    Status  New    Target Date  08/14/17      PT LONG TERM GOAL #4   Title  Pt. will report <3/10 L sided neck pain at worst with daily tasks/ caring for mother.       Baseline  >6/10 L sided neck pain currently at rest.      Time  4    Period  Weeks    Status  New    Target Date  08/14/17         Plan - 07/28/17 1439    Clinical Impression Statement  Pt. reported tenderness in B UT and cervical region upon palpation.  Pt. tolerated stretching of B UT and cervical region into pain-free range.  Pt. also reports that manual cervical distraction continues to be very beneficial and reduced pain in the neck.  STM was also performed in order to increase tissue mobility and decrease pain and trigger points in UT/cervicla region.  Pt reports decreased pain of 5/10 upon end of therapy session.  Pt also reminded of HEP and importance of movement on joints in order to decrease pain and increase functional ROM.     Clinical Presentation  Stable     Clinical Decision Making  Low    Rehab Potential  Fair    PT Frequency  2x / week    PT Duration  4 weeks    PT Treatment/Interventions  ADLs/Self Care Home Management;Aquatic Therapy;Cryotherapy;Electrical Stimulation;Moist Heat;Functional mobility training;Therapeutic activities;Therapeutic exercise;Patient/family education;Neuromuscular re-education;Manual techniques;Dry needling;Passive range of motion    PT Next Visit Plan  Progress upright posture/ cervical strengthening/ isometrics.  ISSUE NEW EX.     PT Home Exercise Plan  see handouts.       Patient will benefit from skilled therapeutic intervention in order to improve the following deficits and impairments:  Pain, Improper body mechanics, Postural dysfunction, Decreased mobility, Decreased activity tolerance, Decreased endurance, Decreased range of motion, Decreased strength, Hypomobility, Impaired flexibility  Visit Diagnosis: Neck pain on left side  Joint stiffness  Abnormal posture  Muscle weakness (generalized)     Problem List Patient Active Problem List   Diagnosis Date Noted  . Myofascial pain 05/27/2017  . Chronic pain of left upper extremity 05/27/2017  . Morbid obesity ([place]) 05/06/2017  . Uncontrolled type 2 diabetes mellitus with hyperglycemia, with long-term current use of insulin ([place]) 02/07/2017  . Hyperlipidemia, mixed 08/08/2016  . Hx of adenomatous colonic polyps 06/14/2016  . 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 Chronic pain syndrome 03/11/2016  . Obesity, Class II, BMI 35-39.9 03/11/2016  . Osteopenia of multiple sites 01/26/2016  . Heart burn 12/18/2015  . Polyneuropathy 11/16/2015  .  Lumbar spondylosis 09/25/2015  . Lumbar facet syndrome (Bilateral) (L>R) 09/25/2015  . Recurrent major depressive disorder, in full remission ([place]) 09/19/2015  . Diabetic neuropathy ([place]) 09/06/2015  . H/O colostomy 07/12/2015  . Chronic abdominal pain (Location of Primary Source of Pain)  (Bilateral) (L>R) 07/03/2015  . Long term current use of opiate analgesic 07/03/2015  . Long term prescription opiate use 07/03/2015  . Opiate use (22.5 MME/Day) 07/03/2015  . Encounter for therapeutic drug level monitoring 07/03/2015  . Encounter for pain management planning 07/03/2015  . Neurogenic pain 07/03/2015  . Musculoskeletal pain 07/03/2015  . Visceral abdominal pain 07/03/2015  . Diabetic peripheral neuropathy ([place]) 07/03/2015  . Chronic low back pain (Location of Secondary source of pain) (Bilateral) (L>R) 07/03/2015  . Chronic lower extremity pain (Location of Tertiary source of pain) (Bilateral) (R>L) 07/03/2015  . Chronic hip pain (Bilateral) (L>R) 07/03/2015  . Chronic knee pain (Bilateral) (L>R) 07/03/2015  . Chronic neck pain (Bilateral) (L>R) 07/03/2015  . Absolute anemia 02/16/2015  . Aortic heart valve narrowing 02/16/2015  . Arthritis 02/16/2015  . Chronic kidney disease (CKD), stage III (moderate) ([place]) 02/16/2015  . Focal and segmental hyalinosis 02/16/2015  . Bleeding gastrointestinal 02/16/2015  . Acquired atrophy of thyroid 02/16/2015  . Psoriasis 02/16/2015  . Small bowel obstruction ([place]) 02/04/2015  . Paroxysmal supraventricular tachycardia ([place]) 10/11/2014  . SOBOE (shortness of breath on exertion) 10/11/2014  . Benign essential HTN 10/04/2014  . Obesity 09/05/2014  . B12 deficiency 03/02/2014  . Type 2 diabetes mellitus with stage 4 chronic kidney disease, with long-term current use of insulin ([place]) 12/01/2013  . Abnormal presence of protein in urine 10/15/2010   Pura Spice, PT, DPT # [place], SPT 07/28/2017, 3:55 PM  [place] [facility] [address] [place], [place], [address] Phone: [phone]   Fax:  [phone]  Name: [name] MRN: [id number] Date of Birth: [birth year]

## 2017-07-31 ENCOUNTER — Ambulatory Visit: Payer: Medicare Other | Admitting: Physical Therapy

## 2017-07-31 ENCOUNTER — Other Ambulatory Visit: Payer: Self-pay | Admitting: Internal Medicine

## 2017-07-31 ENCOUNTER — Encounter: Payer: Self-pay | Admitting: Physical Therapy

## 2017-07-31 DIAGNOSIS — M4722 Other spondylosis with radiculopathy, cervical region: Secondary | ICD-10-CM

## 2017-07-31 DIAGNOSIS — M6281 Muscle weakness (generalized): Secondary | ICD-10-CM

## 2017-07-31 DIAGNOSIS — M542 Cervicalgia: Secondary | ICD-10-CM

## 2017-07-31 DIAGNOSIS — M2560 Stiffness of unspecified joint, not elsewhere classified: Secondary | ICD-10-CM

## 2017-07-31 DIAGNOSIS — R293 Abnormal posture: Secondary | ICD-10-CM

## 2017-07-31 NOTE — Therapy (Signed)
[place] [facility] [address]. [place], [place], [address] Phone: [phone]   Fax:  [phone]  Physical Therapy Treatment  Patient Details  Name: [name] MRN: [id number] Date of Birth: [birth year] Referring Provider: Dr. Ramonita Lab III   Encounter Date: 07/31/2017  PT End of Session - 07/31/17 1421    Visit Number  4    Number of Visits  8    Date for PT Re-Evaluation  08/14/17    PT Start Time  1253    PT Stop Time  1349    PT Time Calculation (min)  56 min    Activity Tolerance  Patient tolerated treatment well;Patient limited by pain    Behavior During Therapy  [facility] for tasks assessed/performed       Past Medical History:  Diagnosis Date  . Anemia   . Anxiety   . Aortic valve stenosis   . Arthritis    feet, legs  . B12 deficiency   . Bowel obstruction ([place])   . CKD (chronic kidney disease)    protein in urine  . Colostomy in place [facility])   . Diabetes mellitus without complication ([place])   . Diverticulitis large intestine   . Dysrhythmia   . Ectopic atrial tachycardia ([place])   . FSGS (focal segmental glomerulosclerosis)   . Gastritis   . GI bleed   . Heart murmur   . Hyperlipidemia   . Hypertension   . Hypothyroidism   . Hypothyroidism    unspecified  . MRSA (methicillin resistant Staphylococcus aureus)    at abdominal wound.  Jan 2017.  Treated.   . Neuropathy   . Neuropathy in diabetes ([place])   . Obesity   . Pedal edema   . Psoriasis   . Shortness of breath dyspnea   . Vertigo     Past Surgical History:  Procedure Laterality Date  . ABDOMINAL HYSTERECTOMY    . APPLICATION OF WOUND VAC N/A 02/24/2015   Procedure: APPLICATION OF WOUND VAC;  Surgeon: Clayburn Pert, MD;  Location: [facility];  Service: General;  Laterality: N/A;  . APPLICATION OF WOUND VAC N/A 02/26/2015   Procedure: APPLICATION OF WOUND VAC;  Surgeon: Clayburn Pert, MD;  Location: [facility];  Service: General;  Laterality: N/A;  . BACK  SURGERY     spur frmoved from lower back  . CARDIAC CATHETERIZATION    . COLECTOMY    . COLONOSCOPY WITH PROPOFOL N/A 09/04/2016   Procedure: COLONOSCOPY WITH PROPOFOL;  Surgeon: Manya Silvas, MD;  Location[facility];  Service: Endoscopy;  Laterality: N/A;  . COLOSTOMY REVERSAL N/A 07/12/2015   Procedure: COLOSTOMY REVERSAL;  Surgeon: Clayburn Pert, MD;  Location: [facility];  Service: General;  Laterality: N/A;  . DEBRIDEMENT OF ABDOMINAL WALL ABSCESS N/A 02/22/2015   Procedure: DEBRIDEMENT OF ABDOMINAL WALL ABSCESS;  Surgeon: Clayburn Pert, MD;  Location: [facility];  Service: General;  Laterality: N/A;  . EXCISION MASS ABDOMINAL N/A 02/24/2015   Procedure: EXCISION MASS ABDOMINAL  / [place];  Surgeon: Clayburn Pert, MD;  Location: [facility];  Service: General;  Laterality: N/A;  . EXCISION MASS ABDOMINAL N/A 02/26/2015   Procedure: EXCISION MASS ABDOMINAL/wash out;  Surgeon: Clayburn Pert, MD;  Location: [facility];  Service: General;  Laterality: N/A;  . FLEXIBLE SIGMOIDOSCOPY  06/19/2015   Procedure: FLEXIBLE SIGMOIDOSCOPY;  Surgeon: Lucilla Lame, MD;  Location: [place];  Service: Endoscopy;;  UNABLE TO ACCESS OSTOMY SITE FOR ACCESS INTO COLON  .  LAPAROSCOPY  07/12/2015   Procedure: LAPAROSCOPY DIAGNOSTIC;  Surgeon: Clayburn Pert, MD;  Location: [facility];  Service: General;;  . LAPAROTOMY  07/12/2015   Procedure: EXPLORATORY LAPAROTOMY;  Surgeon: Clayburn Pert, MD;  Location: [facility];  Service: General;;  . LAPAROTOMY N/A 02/06/2015   Procedure: Laparotomy, reduction of incarcerated parastomal hernia, repair of parastomal hernia with mesh;  Surgeon: Sherri Rad, MD;  Location: [facility];  Service: General;  Laterality: N/A;  . laparotomy closure of cecal perforation  05/09/2013   Dr. Marina Gravel  . LYSIS OF ADHESION  07/12/2015   Procedure: LYSIS OF ADHESION;  Surgeon: Clayburn Pert, MD;  Location: [facility];  Service: General;;  . TONSILLECTOMY    . WOUND DEBRIDEMENT N/A  03/03/2015   Procedure: DEBRIDEMENT ABDOMINAL WOUND;  Surgeon: Florene Glen, MD;  Location: [facility];  Service: General;  Laterality: N/A;  . WOUND DEBRIDEMENT N/A 03/09/2015   Procedure: DEBRIDEMENT ABDOMINAL WOUND;  Surgeon: Florene Glen, MD;  Location: [facility];  Service: General;  Laterality: N/A;    There were no vitals filed for this visit.  Subjective Assessment - 07/31/17 1247    Subjective  Pt. reports to be in "quite a bit" of pain today, rating an 8/10 and has taken oxycodone pill 5 minutes prior to therapy..  Pt reports to have felt much better after tx. last visit but was sore next day.  Pt. seemed to understand reasoning behind soreness and noted that even with soreness, she still felt better after tx.    Pertinent History  S/p ACDF 10+ years ago with improvement noted.  Pt. reports increase neck pain/ L shoulder pain about 1 year ago.  R handed.  Pt. enjoys coloring book/ read.  Muscle relaxer have been helping neck pain at night.    Limitations  Reading;Lifting;Standing;House hold activities    Patient Stated Goals  Increase neck movement with less pain.      Currently in Pain?  Yes    Pain Score  8     Pain Location  Neck    Pain Orientation  Right;Left    Pain Descriptors / Indicators  Aching;Sore    Pain Type  Chronic pain    Pain Onset  More than a month ago    Pain Frequency  Constant         Treatment:  Manual: Seated STM to B UT 4x30sec  Seated STM to B Cervical region 4x30sec SupineTraction which pt reported alleviated pain 4x30sec Supine Cervical PROM with overpressure in all planes of movement to tolerance 2x30sec Supine SNAGS/NAG with use of towel for stretch of cervical region. 2x30sec holds each side Prone Grade I unilateral/central Pas to thoracic and cervical region of spine 2x30 sec each level of spine  MH was placed on contralateral side of STM and switched when going to other UE      PT Long Term Goals - 07/17/17 1250      PT LONG  TERM GOAL #1   Title  Pt. will increase cervical AROM to [facility] (L rotn./ R lat. flex.) to improve pain-free mobility with daily tasks.      Baseline  Cervical AROM:  flexion (52 deg.)- increase pain, ext. (32 deg.)- decrease pain, L rotn. (46 deg.), R rotn. (57 deg.), L lat. flex. (41 deg.), R lat. flex. (28 deg.)- "sharp" increase in L neck pain.     Time  4    Period  Weeks    Status  New  Target Date  08/14/17      PT LONG TERM GOAL #2   Title  Pt. will increase FOTO from 34 to 53 to improve pain-free mobility.      Baseline  FOTO baseline 34 on 5/30    Time  4    Period  Weeks    Status  New    Target Date  08/14/17      PT LONG TERM GOAL #3   Title  Pt. will demonstate improved upright posture/ head position with reading with no increase c/o pain.      Baseline  Significant thoracic kyphosis/ forward head posture.      Time  4    Period  Weeks    Status  New    Target Date  08/14/17      PT LONG TERM GOAL #4   Title  Pt. will report <3/10 L sided neck pain at worst with daily tasks/ caring for mother.       Baseline  >6/10 L sided neck pain currently at rest.      Time  4    Period  Weeks    Status  New    Target Date  08/14/17            Plan - 07/31/17 1422    Clinical Impression Statement  Pt. reports tenderness once again in the UT and cervical region with palpation.  Pt. tolerated grade I unilateral and central PA's well in thoracic and cervical region lying in prone position, with increased soreness in thoracic region of spine.  Pt. educate on new exercises to do at home consisting of SNAGS and NAGS with use of towel for stretch and movement of cervical region.    Clinical Presentation  Stable    Clinical Decision Making  Low    Rehab Potential  Fair    PT Frequency  2x / week    PT Duration  4 weeks    PT Treatment/Interventions  ADLs/Self Care Home Management;Aquatic Therapy;Cryotherapy;Electrical Stimulation;Moist Heat;Functional mobility  training;Therapeutic activities;Therapeutic exercise;Patient/family education;Neuromuscular re-education;Manual techniques;Dry needling;Passive range of motion    PT Next Visit Plan  Check on HEP and new SNAGS and NAGS givene today/ Focus on joint mobs of spine and reassess how pt. felt after treatment today.    PT Home Exercise Plan  see handouts.       Patient will benefit from skilled therapeutic intervention in order to improve the following deficits and impairments:  Pain, Improper body mechanics, Postural dysfunction, Decreased mobility, Decreased activity tolerance, Decreased endurance, Decreased range of motion, Decreased strength, Hypomobility, Impaired flexibility  Visit Diagnosis: Neck pain on left side  Joint stiffness  Abnormal posture  Muscle weakness (generalized)     Problem List Patient Active Problem List   Diagnosis Date Noted  . Myofascial pain 05/27/2017  . Chronic pain of left upper extremity 05/27/2017  . Morbid obesity ([place]) 05/06/2017  . Uncontrolled type 2 diabetes mellitus with hyperglycemia, with long-term current use of insulin ([place]) 02/07/2017  . Hyperlipidemia, mixed 08/08/2016  . Hx of adenomatous colonic polyps 06/14/2016  . 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 Chronic pain syndrome 03/11/2016  . Obesity, Class II, BMI 35-39.9 03/11/2016  . Osteopenia of multiple sites 01/26/2016  . Heart burn 12/18/2015  . Polyneuropathy 11/16/2015  . Lumbar spondylosis 09/25/2015  . Lumbar facet syndrome (Bilateral) (L>R) 09/25/2015  . Recurrent major depressive disorder, in full remission ([place]) 09/19/2015  . Diabetic neuropathy ([place]) 09/06/2015  .  H/O colostomy 07/12/2015  . Chronic abdominal pain (Location of Primary Source of Pain) (Bilateral) (L>R) 07/03/2015  . Long term current use of opiate analgesic 07/03/2015  . Long term prescription opiate use 07/03/2015  . Opiate use (22.5 MME/Day) 07/03/2015  . Encounter for therapeutic  drug level monitoring 07/03/2015  . Encounter for pain management planning 07/03/2015  . Neurogenic pain 07/03/2015  . Musculoskeletal pain 07/03/2015  . Visceral abdominal pain 07/03/2015  . Diabetic peripheral neuropathy ([place]) 07/03/2015  . Chronic low back pain (Location of Secondary source of pain) (Bilateral) (L>R) 07/03/2015  . Chronic lower extremity pain (Location of Tertiary source of pain) (Bilateral) (R>L) 07/03/2015  . Chronic hip pain (Bilateral) (L>R) 07/03/2015  . Chronic knee pain (Bilateral) (L>R) 07/03/2015  . Chronic neck pain (Bilateral) (L>R) 07/03/2015  . Absolute anemia 02/16/2015  . Aortic heart valve narrowing 02/16/2015  . Arthritis 02/16/2015  . Chronic kidney disease (CKD), stage III (moderate) ([place]) 02/16/2015  . Focal and segmental hyalinosis 02/16/2015  . Bleeding gastrointestinal 02/16/2015  . Acquired atrophy of thyroid 02/16/2015  . Psoriasis 02/16/2015  . Small bowel obstruction ([place]) 02/04/2015  . Paroxysmal supraventricular tachycardia ([place]) 10/11/2014  . SOBOE (shortness of breath on exertion) 10/11/2014  . Benign essential HTN 10/04/2014  . Obesity 09/05/2014  . B12 deficiency 03/02/2014  . Type 2 diabetes mellitus with stage 4 chronic kidney disease, with long-term current use of insulin ([place]) 12/01/2013  . Abnormal presence of protein in urine 10/15/2010   Pura Spice, PT, DPT # [place], SPT 08/01/2017, 10:42 AM  [place] [facility] [address] [place], [place], [address] Phone: [phone]   Fax:  [phone]  Name: ANNALISE MCDIARMID MRN: [id number] Date of Birth: 03-05-1950

## 2017-08-01 ENCOUNTER — Ambulatory Visit
Admission: RE | Admit: 2017-08-01 | Discharge: 2017-08-01 | Disposition: A | Discharge status: Home / Self Care | Payer: Medicare Other | Source: Ambulatory Visit | Attending: Internal Medicine | Admitting: Internal Medicine

## 2017-08-01 DIAGNOSIS — M47812 Spondylosis without myelopathy or radiculopathy, cervical region: Secondary | ICD-10-CM | POA: Insufficient documentation

## 2017-08-01 DIAGNOSIS — Z981 Arthrodesis status: Secondary | ICD-10-CM | POA: Insufficient documentation

## 2017-08-01 DIAGNOSIS — M4722 Other spondylosis with radiculopathy, cervical region: Secondary | ICD-10-CM | POA: Diagnosis present

## 2017-08-01 DIAGNOSIS — M5021 Other cervical disc displacement,  high cervical region: Secondary | ICD-10-CM | POA: Diagnosis not present

## 2017-08-04 ENCOUNTER — Encounter: Payer: Medicare Other | Admitting: Physical Therapy

## 2017-08-07 ENCOUNTER — Encounter: Payer: Medicare Other | Admitting: Physical Therapy

## 2017-08-07 DIAGNOSIS — G959 Disease of spinal cord, unspecified: Secondary | ICD-10-CM | POA: Insufficient documentation

## 2017-08-11 ENCOUNTER — Encounter: Payer: Medicare Other | Admitting: Physical Therapy

## 2017-08-14 ENCOUNTER — Encounter: Payer: Medicare Other | Admitting: Physical Therapy

## 2017-08-14 DIAGNOSIS — I480 Paroxysmal atrial fibrillation: Secondary | ICD-10-CM | POA: Insufficient documentation

## 2017-08-18 HISTORY — PX: CARDIAC VALVE REPLACEMENT: SHX585

## 2017-08-19 DIAGNOSIS — I5032 Chronic diastolic (congestive) heart failure: Secondary | ICD-10-CM | POA: Insufficient documentation

## 2017-08-19 DIAGNOSIS — I5022 Chronic systolic (congestive) heart failure: Secondary | ICD-10-CM | POA: Insufficient documentation

## 2017-09-01 DIAGNOSIS — N179 Acute kidney failure, unspecified: Secondary | ICD-10-CM | POA: Insufficient documentation

## 2017-09-01 DIAGNOSIS — N189 Chronic kidney disease, unspecified: Secondary | ICD-10-CM

## 2017-09-16 ENCOUNTER — Ambulatory Visit: Payer: Medicare Other | Admitting: Nurse Practitioner

## 2017-09-19 DIAGNOSIS — Z952 Presence of prosthetic heart valve: Secondary | ICD-10-CM | POA: Insufficient documentation

## 2017-09-29 ENCOUNTER — Ambulatory Visit: Payer: Medicare Other | Admitting: Nurse Practitioner

## 2017-10-01 ENCOUNTER — Ambulatory Visit: Payer: Medicare Other | Attending: Nurse Practitioner | Admitting: Nurse Practitioner

## 2017-10-01 ENCOUNTER — Encounter: Payer: Self-pay | Admitting: Nurse Practitioner

## 2017-10-01 ENCOUNTER — Other Ambulatory Visit: Payer: Self-pay

## 2017-10-01 VITALS — BP 137/70 | HR 90 | Temp 98.2°F | Resp 16 | Ht 63.0 in | Wt 215.0 lb

## 2017-10-01 DIAGNOSIS — Z90710 Acquired absence of both cervix and uterus: Secondary | ICD-10-CM | POA: Insufficient documentation

## 2017-10-01 DIAGNOSIS — Z794 Long term (current) use of insulin: Secondary | ICD-10-CM | POA: Insufficient documentation

## 2017-10-01 DIAGNOSIS — Z8249 Family history of ischemic heart disease and other diseases of the circulatory system: Secondary | ICD-10-CM | POA: Diagnosis not present

## 2017-10-01 DIAGNOSIS — Z800 Family history of malignant neoplasm of digestive organs: Secondary | ICD-10-CM | POA: Insufficient documentation

## 2017-10-01 DIAGNOSIS — G959 Disease of spinal cord, unspecified: Secondary | ICD-10-CM | POA: Diagnosis not present

## 2017-10-01 DIAGNOSIS — Z888 Allergy status to other drugs, medicaments and biological substances status: Secondary | ICD-10-CM | POA: Diagnosis not present

## 2017-10-01 DIAGNOSIS — Z952 Presence of prosthetic heart valve: Secondary | ICD-10-CM | POA: Insufficient documentation

## 2017-10-01 DIAGNOSIS — Z87891 Personal history of nicotine dependence: Secondary | ICD-10-CM | POA: Diagnosis not present

## 2017-10-01 DIAGNOSIS — M47816 Spondylosis without myelopathy or radiculopathy, lumbar region: Secondary | ICD-10-CM | POA: Diagnosis not present

## 2017-10-01 DIAGNOSIS — E1122 Type 2 diabetes mellitus with diabetic chronic kidney disease: Secondary | ICD-10-CM | POA: Insufficient documentation

## 2017-10-01 DIAGNOSIS — F419 Anxiety disorder, unspecified: Secondary | ICD-10-CM | POA: Insufficient documentation

## 2017-10-01 DIAGNOSIS — Z7901 Long term (current) use of anticoagulants: Secondary | ICD-10-CM | POA: Diagnosis not present

## 2017-10-01 DIAGNOSIS — Z6838 Body mass index (BMI) 38.0-38.9, adult: Secondary | ICD-10-CM | POA: Insufficient documentation

## 2017-10-01 DIAGNOSIS — R109 Unspecified abdominal pain: Secondary | ICD-10-CM

## 2017-10-01 DIAGNOSIS — E538 Deficiency of other specified B group vitamins: Secondary | ICD-10-CM | POA: Insufficient documentation

## 2017-10-01 DIAGNOSIS — N184 Chronic kidney disease, stage 4 (severe): Secondary | ICD-10-CM | POA: Insufficient documentation

## 2017-10-01 DIAGNOSIS — E1142 Type 2 diabetes mellitus with diabetic polyneuropathy: Secondary | ICD-10-CM | POA: Diagnosis not present

## 2017-10-01 DIAGNOSIS — I130 Hypertensive heart and chronic kidney disease with heart failure and stage 1 through stage 4 chronic kidney disease, or unspecified chronic kidney disease: Secondary | ICD-10-CM | POA: Insufficient documentation

## 2017-10-01 DIAGNOSIS — M4712 Other spondylosis with myelopathy, cervical region: Secondary | ICD-10-CM | POA: Diagnosis not present

## 2017-10-01 DIAGNOSIS — Z79899 Other long term (current) drug therapy: Secondary | ICD-10-CM | POA: Diagnosis not present

## 2017-10-01 DIAGNOSIS — G894 Chronic pain syndrome: Secondary | ICD-10-CM

## 2017-10-01 DIAGNOSIS — E039 Hypothyroidism, unspecified: Secondary | ICD-10-CM | POA: Insufficient documentation

## 2017-10-01 DIAGNOSIS — M542 Cervicalgia: Secondary | ICD-10-CM | POA: Diagnosis present

## 2017-10-01 DIAGNOSIS — B351 Tinea unguium: Secondary | ICD-10-CM | POA: Insufficient documentation

## 2017-10-01 DIAGNOSIS — M8589 Other specified disorders of bone density and structure, multiple sites: Secondary | ICD-10-CM | POA: Insufficient documentation

## 2017-10-01 DIAGNOSIS — Z79890 Hormone replacement therapy (postmenopausal): Secondary | ICD-10-CM | POA: Diagnosis not present

## 2017-10-01 DIAGNOSIS — G8929 Other chronic pain: Secondary | ICD-10-CM

## 2017-10-01 DIAGNOSIS — E782 Mixed hyperlipidemia: Secondary | ICD-10-CM | POA: Insufficient documentation

## 2017-10-01 DIAGNOSIS — L409 Psoriasis, unspecified: Secondary | ICD-10-CM | POA: Insufficient documentation

## 2017-10-01 DIAGNOSIS — Z86010 Personal history of colonic polyps: Secondary | ICD-10-CM | POA: Insufficient documentation

## 2017-10-01 DIAGNOSIS — N179 Acute kidney failure, unspecified: Secondary | ICD-10-CM | POA: Insufficient documentation

## 2017-10-01 DIAGNOSIS — Z79891 Long term (current) use of opiate analgesic: Secondary | ICD-10-CM | POA: Insufficient documentation

## 2017-10-01 DIAGNOSIS — Z5181 Encounter for therapeutic drug level monitoring: Secondary | ICD-10-CM | POA: Insufficient documentation

## 2017-10-01 DIAGNOSIS — I5032 Chronic diastolic (congestive) heart failure: Secondary | ICD-10-CM | POA: Diagnosis not present

## 2017-10-01 DIAGNOSIS — I480 Paroxysmal atrial fibrillation: Secondary | ICD-10-CM | POA: Diagnosis not present

## 2017-10-01 DIAGNOSIS — Z833 Family history of diabetes mellitus: Secondary | ICD-10-CM | POA: Insufficient documentation

## 2017-10-01 MED ORDER — OXYCODONE HCL 5 MG PO TABS: 5.0000 mg | ORAL_TABLET | Freq: Three times a day (TID) | ORAL | 0 refills | Status: DC | PRN

## 2017-10-01 NOTE — Progress Notes (Signed)
Patient's Name: [name]  MRN: [id number]  Referring Provider: Adin Hector, MD  DOB: [birth year]  PCP: Adin Hector, MD  DOS: 10/01/2017  Note by: Vevelyn Francois NP  Service setting: Ambulatory outpatient  Specialty: Interventional Pain Management  Location: [facility] (AMB) Pain Management Facility    Patient type: Established    Primary Reason(s) for Visit: Encounter for prescription drug management. (Level of risk: moderate)  CC: Neck Pain (posterior) and Back Pain (lower)  HPI  [name] is a 67 y.o. year old, female patient, who comes today for a medication management evaluation. She has Obesity; Small bowel obstruction ([place]); Absolute anemia; Severe aortic stenosis; Arthritis; B12 deficiency; Benign essential HTN; Chronic kidney disease (CKD), stage III (moderate) ([place]); Focal and segmental hyalinosis; Bleeding gastrointestinal; Acquired atrophy of thyroid; Abnormal presence of protein in urine; Psoriasis; Paroxysmal supraventricular tachycardia (HCC); SOBOE (shortness of breath on exertion); Type 2 diabetes mellitus with stage 4 chronic kidney disease, with long-term current use of insulin ([place]); Chronic abdominal pain (Location of Primary Source of Pain) (Bilateral) (L>R); Long term current use of opiate analgesic; Long term prescription opiate use; Opiate use (22.5 MME/Day); Encounter for therapeutic drug level monitoring; Encounter for pain management planning; Neurogenic pain; Musculoskeletal pain; Visceral abdominal pain; Diabetic peripheral neuropathy ([place]); Chronic low back pain (Location of Secondary source of pain) (Bilateral) (L>R); Chronic lower extremity pain (Location of Tertiary source of pain) (Bilateral) (R>L); Chronic hip pain (Bilateral) (L>R); Chronic knee pain (Bilateral) (L>R); Chronic neck pain (Bilateral) (L>R); H/O colostomy; Diabetic neuropathy ([place]); Recurrent major depressive disorder, in full remission ([place]); Lumbar spondylosis; Lumbar facet syndrome (Bilateral)  (L>R); Polyneuropathy; Heart burn; Osteopenia of multiple sites; FSGS (focal segmental glomerulosclerosis); Chronic pain syndrome; Obesity, Class II, BMI 35-39.9; Hx of adenomatous colonic polyps; Hyperlipidemia, mixed; Uncontrolled type 2 diabetes mellitus with hyperglycemia, with long-term current use of insulin ([place]); Morbid obesity ([place]); Myofascial pain; Chronic pain of left upper extremity; Acute kidney injury superimposed on chronic kidney disease ([place]); Cervical myelopathy (HCC); CHF (congestive heart failure), NYHA class II, chronic, diastolic ([place]); Onychomycosis of multiple toenails with type 2 diabetes mellitus and peripheral neuropathy ([place]); Paroxysmal A-fib ([place]); and S/P TAVR (transcatheter aortic valve replacement) on their problem list. Her primarily concern today is the Neck Pain (posterior) and Back Pain (lower)  Pain Assessment: Location: Posterior Neck Radiating: denies Onset: More than a month ago Duration: Chronic pain Quality: Aching, Constant Severity: 7 /10 (subjective, self-reported pain score)  Note: Reported level is compatible with observation. Clinically the patient looks like a 3/10 A 3/10 is viewed as "Moderate" and described as significantly interfering with activities of daily living (ADL). It becomes difficult to feed, bathe, get dressed, get on and off the toilet or to perform personal hygiene functions. Difficult to get in and out of bed or a chair without assistance. Very distracting. With effort, it can be ignored when deeply involved in activities. Information on the proper use of the pain scale provided to the patient today. When using our objective Pain Scale, levels between 6 and 10/10 are said to belong in an emergency room, as it progressively worsens from a 6/10, described as severely limiting, requiring emergency care not usually available at an outpatient pain management facility. At a 6/10 level, communication becomes difficult and requires great effort.  Assistance to reach the emergency department may be required. Facial flushing and profuse sweating along with potentially dangerous increases in heart rate and blood pressure will be evident. Timing: Constant  Modifying factors: meds help ease pain BP: 137/70  HR: 90  [name] was last scheduled for an appointment on 05/27/2017 for medication management. During today's appointment we reviewed [name]'s chronic pain status, as well as her outpatient medication regimen.  She admits that her neck pain began to get worse.  She was having some numbness tingling in her arms and hands.  She had an x-ray in May which was negative for any acute abnormalities.  The neck pain along with the numbness tingling continued to get worse she had an MRI indicating compression.  She admits that she was seen by neurosurgeon.  She had to have a bowel replacement before she did have her neck surgery.  She admits that she is going to have surgery in September however she is not sure if she can wait that long because of her increased weakness in her upper arms.  She admits the pain goes down into her abdomen.  The patient  reports that she does not use drugs. Her body mass index is 38.09 kg/m.  Further details on both, my assessment(s), as well as the proposed treatment plan, please see below.  Controlled Substance Pharmacotherapy Assessment REMS (Risk Evaluation and Mitigation Strategy)  Analgesic:Oxycodone IR 18m every 8hours (138mday) MME/day:22.53m20may    [name]  10/01/2017  2:42 PM  Signed Nursing Pain Medication Assessment:  Safety precautions to be maintained throughout the outpatient stay will include: orient to surroundings, keep bed in low position, maintain call bell within reach at all times, provide assistance with transfer out of bed and ambulation.  Medication Inspection Compliance: Pill count conducted under aseptic conditions, in front of the patient. Neither the pills nor the bottle  was removed from the patient's sight at any time. Once count was completed pills were immediately returned to the patient in their original bottle.  Medication: Oxycodone IR Pill/Patch Count: 12 of 90 pills remain Pill/Patch Appearance: Markings consistent with prescribed medication Bottle Appearance: Standard pharmacy container. Clearly labeled. Filled Date: 7 / 15 182019 Last Medication intake:  Yesterday   Pharmacokinetics: Liberation and absorption (onset of action): WNL Distribution (time to peak effect): WNL Metabolism and excretion (duration of action): WNL         Pharmacodynamics: Desired effects: Analgesia: Ms. AllHollingsheadports >50% benefit. Functional ability: Patient reports that medication allows her to accomplish basic ADLs Clinically meaningful improvement in function (CMIF): Sustained CMIF goals met Perceived effectiveness: Described as relatively effective, allowing for increase in activities of daily living (ADL) Undesirable effects: Side-effects or Adverse reactions: None reported Monitoring: [place] PMP: Online review of the past 12-61-monthiod conducted. Compliant with practice rules and regulations Last UDS on record: Summary  Date Value Ref Range Status  05/27/2017 FINAL  Final    Comment:    ==================================================================== TOXASSURE SELECT 13 (MW) ==================================================================== Test                             Result       Flag       Units Drug Present and Declared for Prescription Verification   Oxycodone                      427          EXPECTED   ng/mg creat   Oxymorphone                    421  EXPECTED   ng/mg creat   Noroxycodone                   273          EXPECTED   ng/mg creat    Sources of oxycodone include scheduled prescription medications.    Oxymorphone and noroxycodone are expected metabolites of    oxycodone. Oxymorphone is also available as a scheduled     prescription medication. ==================================================================== Test                      Result    Flag   Units      Ref Range   Creatinine              48               mg/dL      >=20 ==================================================================== Declared Medications:  The flagging and interpretation on this report are based on the  following declared medications.  Unexpected results may arise from  inaccuracies in the declared medications.  **Note: The testing scope of this panel includes these medications:  Oxycodone  **Note: The testing scope of this panel does not include following  reported medications:  Amlodipine Besylate  Atorvastatin  Calcitriol  Cetirizine (Zyrtec)  Clobetasol (Temovate)  Duloxetine  Glimepiride (Amaryl)  Insulin (Humalog)  Insulin (Lantus)  Levothyroxine  Metoprolol  Nystatin  Telmisartan (Micardis)  Vitamin D2  Vitamin D2 (Drisdol) ==================================================================== For clinical consultation, please call [phone]. ====================================================================    UDS interpretation: Compliant          Medication Assessment Form: Reviewed. Patient indicates being compliant with therapy Treatment compliance: Compliant Risk Assessment Profile: Aberrant behavior: See prior evaluations. None observed or detected today Comorbid factors increasing risk of overdose: See prior notes. No additional risks detected today Opioid risk tool (ORT) (Total Score): 0 Personal History of Substance Abuse (SUD-Substance use disorder):  Alcohol: Negative  Illegal Drugs: Negative  Rx Drugs: Negative  ORT Risk Level calculation: Low Risk Risk of substance use disorder (SUD): Low Opioid Risk Tool - 10/01/17 1444      Family History of Substance Abuse   Alcohol  Negative    Illegal Drugs  Negative    Rx Drugs  Negative      Personal History of Substance Abuse    Alcohol  Negative    Illegal Drugs  Negative    Rx Drugs  Negative      Age   Age between 59-45 years   No      Psychological Disease   Psychological Disease  Negative    Depression  Negative      Total Score   Opioid Risk Tool Scoring  0    Opioid Risk Interpretation  Low Risk      ORT Scoring interpretation table:  Score <3 = Low Risk for SUD  Score between 4-7 = Moderate Risk for SUD  Score >8 = High Risk for Opioid Abuse   Risk Mitigation Strategies:  Patient Counseling: Covered Patient-Prescriber Agreement (PPA): Present and active  Notification to other healthcare providers: Done  Pharmacologic Plan: No change in therapy, at this time.             Laboratory Chemistry  Inflammation Markers (CRP: Acute Phase) (ESR: Chronic Phase) Lab Results  Component Value Date   CRP 0.6 11/07/2015   ESRSEDRATE 16 11/07/2015   LATICACIDVEN 0.8 02/21/2015  Rheumatology Markers Lab Results  Component Value Date   LABURIC 7.6 (H) 07/14/2015                        Renal Function Markers Lab Results  Component Value Date   BUN 45 (H) 07/24/2017   CREATININE 2.42 (H) 07/24/2017   GFRAA 23 (L) 07/24/2017   GFRNONAA 20 (L) 07/24/2017                             Hepatic Function Markers Lab Results  Component Value Date   AST 19 07/24/2017   ALT 14 07/24/2017   ALBUMIN 3.8 07/24/2017   ALKPHOS 59 07/24/2017   LIPASE 21 02/04/2015                        Electrolytes Lab Results  Component Value Date   NA 138 07/24/2017   K 4.8 07/24/2017   CL 107 07/24/2017   CALCIUM 9.1 07/24/2017   MG 2.2 11/07/2015   PHOS 4.2 07/21/2015                        Neuropathy Markers Lab Results  Component Value Date   VITAMINB12 273 11/07/2015   HGBA1C 9.2 (H) 02/04/2015                        Bone Pathology Markers Lab Results  Component Value Date   25OHVITD1 43 11/07/2015   25OHVITD2 38 11/07/2015   25OHVITD3 5.4 11/07/2015                          Coagulation Parameters Lab Results  Component Value Date   INR 1.28 02/22/2015   LABPROT 16.1 (H) 02/22/2015   APTT 36 02/22/2015   PLT 239 07/24/2017                        Cardiovascular Markers Lab Results  Component Value Date   TROPONINI 0.08 (H) 07/19/2015   HGB 12.8 07/24/2017   HCT 39.7 07/24/2017                         CA Markers No results found for: CEA, CA125, LABCA2                      Note: Lab results reviewed.  Recent Diagnostic Imaging Results  MR CERVICAL SPINE WO CONTRAST CLINICAL DATA:  67 year old with posterior neck pain radiating into the left shoulder for a couple months. Recent bilateral hand numbness. Previous cervical fusion.  EXAM: MRI CERVICAL SPINE WITHOUT CONTRAST  TECHNIQUE: Multiplanar, multisequence MR imaging of the cervical spine was performed. No intravenous contrast was administered.  COMPARISON:  Radiographs 09/06/2015. Report only from cervical MRI 12/16/2001.  FINDINGS: Alignment: Normal.  Vertebrae: Status post C4-6 ACDF. Interbody fusion appears solid. T1 hemangioma noted. No acute osseous findings are seen.  Cord: There is cord compression at C3-4 with probable cord hyperintensity, best seen on the axial images. The cervical cord is otherwise normal in signal and caliber.  Posterior Fossa, vertebral arteries, paraspinal tissues: Visualized portions of the posterior fossa and paraspinal soft tissues appear unremarkable. Bilateral vertebral artery flow voids.  Disc levels:  C2-3: Asymmetric facet hypertrophy on the right contributes to mild-to-moderate right  foraminal narrowing. No cord deformity. The left foramen is patent.  C3-4: There is a large central disc extrusion, best seen on sagittal series 3. This markedly compresses the cervical cord, narrowing the AP diameter of the canal to approximately 4 mm. There is mild asymmetric facet hypertrophy on the right, contributing to mild right foraminal  narrowing. The left foramen appears patent.  C4-5: Solid interbody and posterolateral fusion. No spinal stenosis, foraminal narrowing or cord deformity.  C5-6: Solid interbody and posterolateral fusion. No spinal stenosis, foraminal narrowing or cord deformity.  C6-7: Mild disc bulging with mild left-greater-than-right facet hypertrophy. Mild left foraminal narrowing. No cord deformity.  C7-T1: There is spondylosis with posterior osteophytes covering diffusely bulging disc material. Mild bilateral facet hypertrophy. No cord deformity. Mild foraminal narrowing bilaterally.  IMPRESSION: 1. Large disc extrusion at C3-4 with resulting cord compression. There is increased signal in the cord, suspicious for edema or early myelomalacia. Neuro surgical evaluation recommended. 2. Solid interbody fusion post C4-6 ACDF. No residual spinal stenosis at the operative levels. 3. Mild spondylosis at C6-7 and C7-T1 without cord deformity or high-grade foraminal narrowing. 4. Critical Value/emergent results were called by telephone at the time of interpretation on 08/01/2017 at 3:34 pm to Dr. Ramonita Lab , who verbally acknowledged these results.  Electronically Signed   By: Richardean Sale M.D.   On: 08/01/2017 15:34  Complexity Note: Imaging results reviewed. Results shared with Ms. Azevedo, using Layman's terms.                         Meds   Current Outpatient Medications:  .  amLODipine (NORVASC) 5 MG tablet, Take 1 tablet (5 mg total) by mouth daily., Disp: 30 tablet, Rfl: 1 .  apixaban (ELIQUIS) 5 MG TABS tablet, Take 5 mg by mouth 2 (two) times daily., Disp: , Rfl:  .  atorvastatin (LIPITOR) 40 MG tablet, Take 40 mg by mouth daily at 6 PM. , Disp: , Rfl:  .  clobetasol cream (TEMOVATE) 1.61 %, Apply 1 application topically daily., Disp: , Rfl:  .  DULoxetine (CYMBALTA) 30 MG capsule, Take 30 mg by mouth daily. , Disp: , Rfl:  .  glucose blood (ONE TOUCH ULTRA TEST) test strip, TEST TWICE  DAILY, Disp: , Rfl:  .  glucose blood (ONE TOUCH ULTRA TEST) test strip, TEST TWICE DAILY, Disp: , Rfl:  .  insulin glargine (LANTUS) 100 UNIT/ML injection, Inject 24 Units into the skin at bedtime. , Disp: , Rfl:  .  insulin lispro (HUMALOG KWIKPEN) 100 UNIT/ML KiwkPen, Take up to 50 units daily in divided doses, as directed., Disp: , Rfl:  .  levothyroxine (SYNTHROID, LEVOTHROID) 88 MCG tablet, TAKE 1 TABLET BY MOUTH ONCE A DAY FOR THYROID, Disp: , Rfl:  .  ONETOUCH DELICA LANCETS 09U MISC, TEST TWICE DAILY, Disp: , Rfl:  .  [START ON 11/30/2017] oxyCODONE (OXY IR/ROXICODONE) 5 MG immediate release tablet, Take 1 tablet (5 mg total) by mouth every 8 (eight) hours as needed for severe pain., Disp: 90 tablet, Rfl: 0 .  Sulfamethoxazole-Trimethoprim (SULFAMETHOXAZOLE-TMP DS PO), Take 160 mg by mouth 2 (two) times daily., Disp: , Rfl:  .  [START ON 10/31/2017] oxyCODONE (OXY IR/ROXICODONE) 5 MG immediate release tablet, Take 1 tablet (5 mg total) by mouth every 8 (eight) hours as needed for severe pain., Disp: 90 tablet, Rfl: 0 .  oxyCODONE (OXY IR/ROXICODONE) 5 MG immediate release tablet, Take 1 tablet (5 mg total) by mouth  every 8 (eight) hours as needed for severe pain., Disp: 90 tablet, Rfl: 0  ROS  Constitutional: Denies any fever or chills Gastrointestinal: No reported hemesis, hematochezia, vomiting, or acute GI distress Musculoskeletal: Denies any acute onset joint swelling, redness, loss of ROM, or weakness Neurological: No reported episodes of acute onset apraxia, aphasia, dysarthria, agnosia, amnesia, paralysis, loss of coordination, or loss of consciousness  Allergies  Ms. Daddona is allergic to buspirone; citalopram; lisinopril; metformin; pravastatin; sitagliptin; tramadol; diltiazem hcl; gabapentin; hydralazine; and lovastatin.  [place]  Drug: Ms. Scallan  reports that she does not use drugs. Alcohol:  reports that she does not drink alcohol. Tobacco:  reports that she quit smoking about  4 years ago. Her smoking use included cigarettes. She has never used smokeless tobacco. Medical:  has a past medical history of Anemia, Anxiety, Aortic valve stenosis, Arthritis, B12 deficiency, Bowel obstruction ([place]), CKD (chronic kidney disease), Colostomy in place [facility]), Diabetes mellitus without complication ([place]), Diverticulitis large intestine, Dysrhythmia, Ectopic atrial tachycardia ([place]), FSGS (focal segmental glomerulosclerosis), Gastritis, GI bleed, Heart murmur, Hyperlipidemia, Hypertension, Hypothyroidism, Hypothyroidism, MRSA (methicillin resistant Staphylococcus aureus), Neuropathy, Neuropathy in diabetes ([place]), Obesity, Pedal edema, Psoriasis, Shortness of breath dyspnea, and Vertigo. Surgical: Ms. Coonradt  has a past surgical history that includes Colectomy; laparotomy closure of cecal perforation (05/09/2013); Tonsillectomy; Back surgery; Cardiac catheterization; Abdominal hysterectomy; Debridement of abdominal wall abscess (N/A, 02/22/2015); Excision mass abdominal (N/A, 02/24/2015); Application if wound vac (N/A, 02/24/2015); Excision mass abdominal (N/A, 02/26/2015); Application if wound vac (N/A, 02/26/2015); Wound debridement (N/A, 03/03/2015); Wound debridement (N/A, 03/09/2015); Flexible sigmoidoscopy (06/19/2015); Colostomy reversal (N/A, 07/12/2015); laparotomy (07/12/2015); laparoscopy (07/12/2015); Lysis of adhesion (07/12/2015); laparotomy (N/A, 02/06/2015); Colonoscopy with propofol (N/A, 09/04/2016); and Cardiac valve replacement (08/2017). Family: family history includes CAD in her father; Colon cancer in her brother; Diabetes in her mother; Heart attack in her father; Hypertension in her father.  Constitutional Exam  General appearance: alert, cooperative, in mild distress and morbidly obese Vitals:   10/01/17 1429  BP: 137/70  Pulse: 90  Resp: 16  Temp: 98.2 F (36.8 C)  TempSrc: Oral  SpO2: 95%  Weight: 215 lb (97.5 kg)  Height: '5\' 3"'  (1.6 m)   BMI Assessment: Estimated body mass  index is 38.09 kg/m as calculated from the following:   Height as of this encounter: '5\' 3"'  (1.6 m).   Weight as of this encounter: 215 lb (97.5 kg). Psych/Mental status: Alert, oriented x 3 (person, place, & time)       Eyes: PERLA Respiratory: No evidence of acute respiratory distress  Cervical Spine Area Exam  Skin & Axial Inspection: No masses, redness, edema, swelling, or associated skin lesions Alignment: Symmetrical Functional ROM: Adequate ROM      Stability: No instability detected Muscle Tone/Strength: Functionally intact. No obvious neuro-muscular anomalies detected. Sensory (Neurological): Unimpaired Palpation: Complains of area being tender to palpation              Upper Extremity (UE) Exam    Side: Right upper extremity  Side: Left upper extremity  Skin & Extremity Inspection: Skin color, temperature, and hair growth are WNL. No peripheral edema or cyanosis. No masses, redness, swelling, asymmetry, or associated skin lesions. No contractures.  Skin & Extremity Inspection: Skin color, temperature, and hair growth are WNL. No peripheral edema or cyanosis. No masses, redness, swelling, asymmetry, or associated skin lesions. No contractures.  Functional ROM: Adequate ROM          Functional ROM: Adequate ROM  Muscle Tone/Strength: Movement possible, but not against gravity (2/5)  Muscle Tone/Strength: Movement possible, but not against gravity (2/5)  Sensory (Neurological): Neuropathic pain pattern          Sensory (Neurological): Neuropathic pain pattern          Palpation: No palpable anomalies              Palpation: No palpable anomalies                   Gait & Posture Assessment  Ambulation: Patient ambulates using a wheel chair Gait: Relatively normal for age and body habitus Posture: WNL    Assessment  Primary Diagnosis & Pertinent Problem List: The primary encounter diagnosis was Cervical myelopathy ([place]). Diagnoses of Lumbar spondylosis, Chronic abdominal  pain (Location of Primary Source of Pain) (Bilateral) (L>R), and Chronic pain syndrome were also pertinent to this visit.  Status Diagnosis  Worsening Controlled Controlled 1. Cervical myelopathy ([place])   2. Lumbar spondylosis   3. Chronic abdominal pain (Location of Primary Source of Pain) (Bilateral) (L>R)   4. Chronic pain syndrome     Problems updated and reviewed during this visit: Problem  Psoriasis   Overview:  Dr. Tyler Deis Overview:  Dr. Tyler Deis  Overview:  Dr. Tyler Deis   Severe Aortic Stenosis   Overview:  moderat by echo 9/11; stable by echo 6/14  Echo 09/01/2017: Aortic: MODERATE AR, SEVERE AS 4.9 m/sec peak vel, 96 mmHg peak grad 58 mmHg mean grad EF 55% S/p TAVR   S/P Tavr (Transcatheter Aortic Valve Replacement)   29 EVOLUT R lt axillary 09/15/17 [facility]   Acute Kidney Injury Superimposed On Chronic Kidney Disease (Hcc)  Chf (Congestive Heart Failure), [place] Class II, Chronic, Diastolic (Hcc)  Paroxysmal A-Fib (Hcc)  Cervical Myelopathy (Hcc)  Onychomycosis of Multiple Toenails With Type 2 Diabetes Mellitus and Peripheral Neuropathy (Hcc)   Plan of Care  Pharmacotherapy (Medications Ordered): Meds ordered this encounter  Medications  . oxyCODONE (OXY IR/ROXICODONE) 5 MG immediate release tablet    Sig: Take 1 tablet (5 mg total) by mouth every 8 (eight) hours as needed for severe pain.    Dispense:  90 tablet    Refill:  0    Do not place this medication on "Automatic Refill". Patient may have prescription filled one day early if pharmacy is closed on scheduled refill date. Do not fill until:12/02/2017 To last until: 01/01/2018    Order Specific Question:   Supervising Provider    Answer:   Milinda Pointer [phone]  . oxyCODONE (OXY IR/ROXICODONE) 5 MG immediate release tablet    Sig: Take 1 tablet (5 mg total) by mouth every 8 (eight) hours as needed for severe pain.    Dispense:  90 tablet    Refill:  0    Do not place this medication on  "Automatic Refill". Patient may have prescription filled one day early if pharmacy is closed on scheduled refill date. Do not fill until:10/31/2017 To last until:11/30/2017    Order Specific Question:   Supervising Provider    Answer:   Milinda Pointer [phone]  . oxyCODONE (OXY IR/ROXICODONE) 5 MG immediate release tablet    Sig: Take 1 tablet (5 mg total) by mouth every 8 (eight) hours as needed for severe pain.    Dispense:  90 tablet    Refill:  0    Do not place this medicationon "Automatic Refill". Patient may have prescription filled one day early if pharmacy is closed on  scheduled refill date. Do not fill until:10/01/2017 To last until: 10/31/2017    Order Specific Question:   Supervising Provider    Answer:   Milinda Pointer [phone]   New Prescriptions   No medications on file   Medications administered today: Elvi Leventhal. Deltoro had no medications administered during this visit. Lab-work, procedure(s), and/or referral(s): No orders of the defined types were placed in this encounter.  Imaging and/or referral(s): None   Interventional therapies: Planned, scheduled, and/or pending:  Not at this time.   Considering:  Diagnostic bilateral lumbar facet block  Possible bilateral lumbar facet rate frequency ablation.  Diagnostic bilateral celiac plexus block  Diagnostic bilateral intra-articular hip injection  Possible diagnostic bilateral femoral nerve and obturator nerve articular branch blocks  Possible bilateral femoral nerve and obturator nerve articular branch frequency ablation.  Diagnostic bilateral intra-articular knee joint injection  Possible series of 5 bilateral intra-articular Hyalgan knee injections.  Bilateral diagnostic genicular nerve block  Possible bilateral genicular nerve radiofrequency ablation    Palliative PRN treatment(s):  Diagnostic bilateral lumbar facet block  Diagnostic bilateral celiac plexus block  Diagnostic bilateral intra-articular  hip injection  Possible diagnostic bilateral femoral nerve and obturator nerve articular branch blocks  Diagnostic bilateral intra-articular knee joint injection Bilateral diagnostic genicular nerve block     Provider-requested follow-up: Return in about 3 months (around 01/01/2018) for MedMgmt with Me Dionisio David).  Future Appointments  Date Time Provider [place]  12/29/2017  1:30 PM Vevelyn Francois, NP [facility] None   Primary Care Physician: Adin Hector, MD Location: [facility] Note by: Vevelyn Francois NP Date: 10/01/2017; Time: 5:03 PM  Pain Score Disclaimer: We use the NRS-11 scale. This is a self-reported, subjective measurement of pain severity with only modest accuracy. It is used primarily to identify changes within a particular patient. It must be understood that outpatient pain scales are significantly less accurate that those used for research, where they can be applied under ideal controlled circumstances with minimal exposure to variables. In reality, the score is likely to be a combination of pain intensity and pain affect, where pain affect describes the degree of emotional arousal or changes in action readiness caused by the sensory experience of pain. Factors such as social and work situation, setting, emotional state, anxiety levels, expectation, and prior pain experience may influence pain perception and show large inter-individual differences that may also be affected by time variables.  Patient instructions provided during this appointment: Patient Instructions  _3 prescriptions for Oxycodone have been esribed to your pharmacy to last until 12/30/2017.  ___________________________________________________________________________________________  Medication Rules  Applies to: All patients receiving prescriptions (written or electronic).  Pharmacy of record: Pharmacy where electronic prescriptions will be sent. If written  prescriptions are taken to a different pharmacy, please inform the nursing staff. The pharmacy listed in the electronic medical record should be the one where you would like electronic prescriptions to be sent.  Prescription refills: Only during scheduled appointments. Applies to both, written and electronic prescriptions.  NOTE: The following applies primarily to controlled substances (Opioid* Pain Medications).   Patient's responsibilities: 1. Pain Pills: Bring all pain pills to every appointment (except for procedure appointments). 2. Pill Bottles: Bring pills in original pharmacy bottle. Always bring newest bottle. Bring bottle, even if empty. 3. Medication refills: You are responsible for knowing and keeping track of what medications you need refilled. The day before your appointment, write a list of all prescriptions that need to  be refilled. Bring that list to your appointment and give it to the admitting nurse. Prescriptions will be written only during appointments. If you forget a medication, it will not be "Called in", "Faxed", or "electronically sent". You will need to get another appointment to get these prescribed. 4. Prescription Accuracy: You are responsible for carefully inspecting your prescriptions before leaving our office. Have the discharge nurse carefully go over each prescription with you, before taking them home. Make sure that your name is accurately spelled, that your address is correct. Check the name and dose of your medication to make sure it is accurate. Check the number of pills, and the written instructions to make sure they are clear and accurate. Make sure that you are given enough medication to last until your next medication refill appointment. 5. Taking Medication: Take medication as prescribed. Never take more pills than instructed. Never take medication more frequently than prescribed. Taking less pills or less frequently is permitted and encouraged, when it comes to  controlled substances (written prescriptions).  6. Inform other Doctors: Always inform, all of your healthcare providers, of all the medications you take. 7. Pain Medication from other Providers: You are not allowed to accept any additional pain medication from any other Doctor or Healthcare provider. There are two exceptions to this rule. (see below) In the event that you require additional pain medication, you are responsible for notifying us, as stated below. 8. Medication Agreement: You are responsible for carefully reading and following our Medication Agreement. This must be signed before receiving any prescriptions from our practice. Safely store a copy of your signed Agreement. Violations to the Agreement will result in no further prescriptions. (Additional copies of our Medication Agreement are available upon request.) 9. Laws, Rules, & Regulations: All patients are expected to follow all Federal and [employer], [employer], Rules, [employer]. Ignorance of the Laws does not constitute a valid excuse. The use of any illegal substances is prohibited. 10. Adopted CDC guidelines & recommendations: Target dosing levels will be at or below 60 MME/day. Use of benzodiazepines** is not recommended.  Exceptions: There are only two exceptions to the rule of not receiving pain medications from other Healthcare Providers. 1. Exception #1 (Emergencies): In the event of an emergency (i.e.: accident requiring emergency care), you are allowed to receive additional pain medication. However, you are responsible for: As soon as you are able, call our office [phone], at any time of the day or night, and leave a message stating your name, the date and nature of the emergency, and the name and dose of the medication prescribed. In the event that your call is answered by a member of our staff, make sure to document and save the date, time, and the name of the person that took your information.  2. Exception #2  (Planned Surgery): In the event that you are scheduled by another doctor or dentist to have any type of surgery or procedure, you are allowed (for a period no longer than 30 days), to receive additional pain medication, for the acute post-op pain. However, in this case, you are responsible for picking up a copy of our "Post-op Pain Management for Surgeons" handout, and giving it to your surgeon or dentist. This document is available at our office, and does not require an appointment to obtain it. Simply go to our office during business hours (Monday-Thursday from 8:00 AM to 4:00 PM) (Friday 8:00 AM to 12:00 Noon) or if you have a scheduled appointment  with [place], prior to your surgery, and ask for it by name. In addition, you will need to provide [place] with your name, name of your surgeon, type of surgery, and date of procedure or surgery.  *Opioid medications include: morphine, codeine, oxycodone, oxymorphone, hydrocodone, hydromorphone, meperidine, tramadol, tapentadol, buprenorphine, fentanyl, methadone. **Benzodiazepine medications include: diazepam (Valium), alprazolam (Xanax), clonazepam (Klonopine), lorazepam (Ativan), clorazepate (Tranxene), chlordiazepoxide (Librium), estazolam (Prosom), oxazepam (Serax), temazepam (Restoril), triazolam (Halcion) (Last updated: 04/17/2017) ____________________________________________________________________________________________

## 2017-10-01 NOTE — Progress Notes (Signed)
Nursing Pain Medication Assessment:  Safety precautions to be maintained throughout the outpatient stay will include: orient to surroundings, keep bed in low position, maintain call bell within reach at all times, provide assistance with transfer out of bed and ambulation.  Medication Inspection Compliance: Pill count conducted under aseptic conditions, in front of the patient. Neither the pills nor the bottle was removed from the patient's sight at any time. Once count was completed pills were immediately returned to the patient in their original bottle.  Medication: Oxycodone IR Pill/Patch Count: 12 of 90 pills remain Pill/Patch Appearance: Markings consistent with prescribed medication Bottle Appearance: Standard pharmacy container. Clearly labeled. Filled Date: 7 / 15 / 2019 Last Medication intake:  Yesterday

## 2017-10-01 NOTE — Patient Instructions (Addendum)
_3 prescriptions for Oxycodone have been esribed to your pharmacy to last until 12/30/2017.  ___________________________________________________________________________________________  Medication Rules  Applies to: All patients receiving prescriptions (written or electronic).  Pharmacy of record: Pharmacy where electronic prescriptions will be sent. If written prescriptions are taken to a different pharmacy, please inform the nursing staff. The pharmacy listed in the electronic medical record should be the one where you would like electronic prescriptions to be sent.  Prescription refills: Only during scheduled appointments. Applies to both, written and electronic prescriptions.  NOTE: The following applies primarily to controlled substances (Opioid* Pain Medications).   Patient's responsibilities: 1. Pain Pills: Bring all pain pills to every appointment (except for procedure appointments). 2. Pill Bottles: Bring pills in original pharmacy bottle. Always bring newest bottle. Bring bottle, even if empty. 3. Medication refills: You are responsible for knowing and keeping track of what medications you need refilled. The day before your appointment, write a list of all prescriptions that need to be refilled. Bring that list to your appointment and give it to the admitting nurse. Prescriptions will be written only during appointments. If you forget a medication, it will not be "Called in", "Faxed", or "electronically sent". You will need to get another appointment to get these prescribed. 4. Prescription Accuracy: You are responsible for carefully inspecting your prescriptions before leaving our office. Have the discharge nurse carefully go over each prescription with you, before taking them home. Make sure that your name is accurately spelled, that your address is correct. Check the name and dose of your medication to make sure it is accurate. Check the number of pills, and the written instructions to  make sure they are clear and accurate. Make sure that you are given enough medication to last until your next medication refill appointment. 5. Taking Medication: Take medication as prescribed. Never take more pills than instructed. Never take medication more frequently than prescribed. Taking less pills or less frequently is permitted and encouraged, when it comes to controlled substances (written prescriptions).  6. Inform other Doctors: Always inform, all of your healthcare providers, of all the medications you take. 7. Pain Medication from other Providers: You are not allowed to accept any additional pain medication from any other Doctor or Healthcare provider. There are two exceptions to this rule. (see below) In the event that you require additional pain medication, you are responsible for notifying us, as stated below. 8. Medication Agreement: You are responsible for carefully reading and following our Medication Agreement. This must be signed before receiving any prescriptions from our practice. Safely store a copy of your signed Agreement. Violations to the Agreement will result in no further prescriptions. (Additional copies of our Medication Agreement are available upon request.) 9. Laws, Rules, & Regulations: All patients are expected to follow all Federal and [employer], [employer], Rules, [employer]. Ignorance of the Laws does not constitute a valid excuse. The use of any illegal substances is prohibited. 10. Adopted CDC guidelines & recommendations: Target dosing levels will be at or below 60 MME/day. Use of benzodiazepines** is not recommended.  Exceptions: There are only two exceptions to the rule of not receiving pain medications from other Healthcare Providers. 1. Exception #1 (Emergencies): In the event of an emergency (i.e.: accident requiring emergency care), you are allowed to receive additional pain medication. However, you are responsible for: As soon as you are able, call our  office [phone], at any time of the day or night, and leave a message stating your name,  the date and nature of the emergency, and the name and dose of the medication prescribed. In the event that your call is answered by a member of our staff, make sure to document and save the date, time, and the name of the person that took your information.  2. Exception #2 (Planned Surgery): In the event that you are scheduled by another doctor or dentist to have any type of surgery or procedure, you are allowed (for a period no longer than 30 days), to receive additional pain medication, for the acute post-op pain. However, in this case, you are responsible for picking up a copy of our "Post-op Pain Management for Surgeons" handout, and giving it to your surgeon or dentist. This document is available at our office, and does not require an appointment to obtain it. Simply go to our office during business hours (Monday-Thursday from 8:00 AM to 4:00 PM) (Friday 8:00 AM to 12:00 Noon) or if you have a scheduled appointment with [place], prior to your surgery, and ask for it by name. In addition, you will need to provide [place] with your name, name of your surgeon, type of surgery, and date of procedure or surgery.  *Opioid medications include: morphine, codeine, oxycodone, oxymorphone, hydrocodone, hydromorphone, meperidine, tramadol, tapentadol, buprenorphine, fentanyl, methadone. **Benzodiazepine medications include: diazepam (Valium), alprazolam (Xanax), clonazepam (Klonopine), lorazepam (Ativan), clorazepate (Tranxene), chlordiazepoxide (Librium), estazolam (Prosom), oxazepam (Serax), temazepam (Restoril), triazolam (Halcion) (Last updated: 04/17/2017) ____________________________________________________________________________________________

## 2017-10-05 DIAGNOSIS — N185 Chronic kidney disease, stage 5: Secondary | ICD-10-CM | POA: Insufficient documentation

## 2017-11-25 DIAGNOSIS — E1142 Type 2 diabetes mellitus with diabetic polyneuropathy: Secondary | ICD-10-CM | POA: Insufficient documentation

## 2017-12-29 ENCOUNTER — Encounter: Payer: Self-pay | Admitting: Nurse Practitioner

## 2017-12-29 ENCOUNTER — Other Ambulatory Visit: Payer: Self-pay

## 2017-12-29 ENCOUNTER — Ambulatory Visit: Payer: Medicare Other | Attending: Nurse Practitioner | Admitting: Nurse Practitioner

## 2017-12-29 VITALS — BP 146/78 | HR 72 | Temp 98.0°F | Ht 63.0 in | Wt 225.0 lb

## 2017-12-29 DIAGNOSIS — I130 Hypertensive heart and chronic kidney disease with heart failure and stage 1 through stage 4 chronic kidney disease, or unspecified chronic kidney disease: Secondary | ICD-10-CM | POA: Insufficient documentation

## 2017-12-29 DIAGNOSIS — R109 Unspecified abdominal pain: Secondary | ICD-10-CM | POA: Diagnosis not present

## 2017-12-29 DIAGNOSIS — Z9889 Other specified postprocedural states: Secondary | ICD-10-CM | POA: Diagnosis not present

## 2017-12-29 DIAGNOSIS — E1122 Type 2 diabetes mellitus with diabetic chronic kidney disease: Secondary | ICD-10-CM | POA: Diagnosis not present

## 2017-12-29 DIAGNOSIS — N184 Chronic kidney disease, stage 4 (severe): Secondary | ICD-10-CM | POA: Diagnosis not present

## 2017-12-29 DIAGNOSIS — I509 Heart failure, unspecified: Secondary | ICD-10-CM | POA: Diagnosis not present

## 2017-12-29 DIAGNOSIS — Z87891 Personal history of nicotine dependence: Secondary | ICD-10-CM | POA: Insufficient documentation

## 2017-12-29 DIAGNOSIS — E039 Hypothyroidism, unspecified: Secondary | ICD-10-CM | POA: Diagnosis not present

## 2017-12-29 DIAGNOSIS — E1142 Type 2 diabetes mellitus with diabetic polyneuropathy: Secondary | ICD-10-CM | POA: Diagnosis not present

## 2017-12-29 DIAGNOSIS — Z8249 Family history of ischemic heart disease and other diseases of the circulatory system: Secondary | ICD-10-CM | POA: Insufficient documentation

## 2017-12-29 DIAGNOSIS — Z933 Colostomy status: Secondary | ICD-10-CM | POA: Insufficient documentation

## 2017-12-29 DIAGNOSIS — Z794 Long term (current) use of insulin: Secondary | ICD-10-CM | POA: Diagnosis not present

## 2017-12-29 DIAGNOSIS — E785 Hyperlipidemia, unspecified: Secondary | ICD-10-CM | POA: Insufficient documentation

## 2017-12-29 DIAGNOSIS — M47816 Spondylosis without myelopathy or radiculopathy, lumbar region: Secondary | ICD-10-CM | POA: Insufficient documentation

## 2017-12-29 DIAGNOSIS — E1165 Type 2 diabetes mellitus with hyperglycemia: Secondary | ICD-10-CM | POA: Diagnosis not present

## 2017-12-29 DIAGNOSIS — Z79899 Other long term (current) drug therapy: Secondary | ICD-10-CM | POA: Insufficient documentation

## 2017-12-29 DIAGNOSIS — M7918 Myalgia, other site: Secondary | ICD-10-CM

## 2017-12-29 DIAGNOSIS — Z833 Family history of diabetes mellitus: Secondary | ICD-10-CM | POA: Insufficient documentation

## 2017-12-29 DIAGNOSIS — G8929 Other chronic pain: Secondary | ICD-10-CM

## 2017-12-29 DIAGNOSIS — G894 Chronic pain syndrome: Secondary | ICD-10-CM | POA: Diagnosis present

## 2017-12-29 DIAGNOSIS — Z6839 Body mass index (BMI) 39.0-39.9, adult: Secondary | ICD-10-CM | POA: Diagnosis not present

## 2017-12-29 DIAGNOSIS — M792 Neuralgia and neuritis, unspecified: Secondary | ICD-10-CM

## 2017-12-29 DIAGNOSIS — Z79890 Hormone replacement therapy (postmenopausal): Secondary | ICD-10-CM | POA: Insufficient documentation

## 2017-12-29 DIAGNOSIS — Z5181 Encounter for therapeutic drug level monitoring: Secondary | ICD-10-CM | POA: Diagnosis not present

## 2017-12-29 DIAGNOSIS — Z79891 Long term (current) use of opiate analgesic: Secondary | ICD-10-CM | POA: Diagnosis not present

## 2017-12-29 DIAGNOSIS — F339 Major depressive disorder, recurrent, unspecified: Secondary | ICD-10-CM | POA: Diagnosis not present

## 2017-12-29 MED ORDER — OXYCODONE HCL 5 MG PO TABS: 5.0000 mg | ORAL_TABLET | Freq: Three times a day (TID) | ORAL | 0 refills | Status: DC | PRN

## 2017-12-29 NOTE — Progress Notes (Signed)
Patient's Name: [name]  MRN: [id number]  Referring Provider: Adin Hector, MD  DOB: [birth year]  PCP: Adin Hector, MD  DOS: 12/29/2017  Note by: Vevelyn Francois NP  Service setting: Ambulatory outpatient  Specialty: Interventional Pain Management  Location: [name] ([facility]    Patient type: Established    Primary Reason(s) for [name]: Encounter for prescription drug management. (Level of risk: moderate)  CC: Neck Pain  HPI  [name] is a 67 y.o. year old, female patient, who comes today for a medication management evaluation. She has Obesity; Small bowel obstruction ([name]); Anemia; Severe aortic stenosis; Arthritis; B12 deficiency; Benign essential HTN; CKD (chronic kidney disease) stage 4, GFR 15-29 ml/min ([name]); Focal and segmental hyalinosis; Bleeding gastrointestinal; Acquired atrophy of thyroid; Abnormal presence of protein in urine; Psoriasis; Paroxysmal supraventricular tachycardia ([name]); SOBOE (shortness of breath on exertion); Diabetes mellitus type 2 in obese ([name]); Chronic abdominal pain (Location of Primary Source of Pain) (Bilateral) (L>R); Long term current use of opiate analgesic; Long term prescription opiate use; Opiate use (22.5 MME/Day); Encounter for therapeutic drug level monitoring; Encounter for pain management planning; Neurogenic pain; Musculoskeletal pain; Visceral abdominal pain; Diabetic peripheral neuropathy ([name]); Chronic low back pain (Location of Secondary source of pain) (Bilateral) (L>R); Chronic lower extremity pain (Location of Tertiary source of pain) (Bilateral) (R>L); Chronic hip pain (Bilateral) (L>R); Chronic knee pain (Bilateral) (L>R); Chronic neck pain (Bilateral) (L>R); H/O colostomy; Diabetic neuropathy ([name]); Recurrent major depressive disorder, in full remission ([name]); Lumbar spondylosis; Lumbar facet syndrome (Bilateral) (L>R); Polyneuropathy; Heart burn; Osteopenia of multiple sites; FSGS (focal segmental  glomerulosclerosis); Chronic pain syndrome; Obesity, Class II, BMI 35-39.9; Hx of adenomatous colonic polyps; Hyperlipidemia, mixed; Uncontrolled type 2 diabetes mellitus with hyperglycemia, with long-term current use of insulin ([name]); Morbid obesity ([name]); Myofascial pain; Chronic pain of left upper extremity; Acute kidney injury superimposed on chronic kidney disease ([name]); Cervical myelopathy ([name]); CHF (congestive heart failure), NYHA class II, chronic, diastolic ([name]); Onychomycosis of multiple toenails with type 2 diabetes mellitus and peripheral neuropathy ([name]); Paroxysmal A-fib ([name]); S/P TAVR (transcatheter aortic valve replacement); and DM type 2 with diabetic peripheral neuropathy ([name]) on their problem list. Her primarily concern today is the Neck Pain  Pain Assessment: Location: Posterior Neck Radiating: pain radiaties to shoulder Onset: More than a month ago Duration: Chronic pain Quality: Throbbing Severity: 6 /10 (subjective, self-reported pain score)  Note: Reported level is compatible with observation. Clinically the patient looks like a 2/10 A 2/10 is viewed as "Mild to Moderate" and described as noticeable and distracting. Impossible to hide from other people. More frequent flare-ups. Still possible to adapt and function close to normal. It can be very annoying and may have occasional stronger flare-ups. With discipline, patients may get used to it and adapt.       When using our objective Pain Scale, levels between 6 and 10/10 are said to belong in an emergency room, as it progressively worsens from a 6/10, described as severely limiting, requiring emergency care not usually available at an outpatient pain management facility. At a 6/10 level, communication becomes difficult and requires great effort. Assistance to reach the emergency department may be required. Facial flushing and profuse sweating along with potentially dangerous increases in heart rate and blood pressure will be  evident. Effect on ADL: limits my daily activites Timing: Constant Modifying factors: medication, heating pad BP: (!) 146/78  HR: 72  [name] was last scheduled for an appointment  on 10/01/2017 for medication management. During today's appointment we reviewed [name]'s chronic pain status, as well as her outpatient medication regimen.  She admits that she is doing better.  She continues to recover.  She denies any pain related concerns today.  The patient  reports that she does not use drugs. Her body mass index is 39.86 kg/m.  Further details on both, my assessment(s), as well as the proposed treatment plan, please see below.  Controlled Substance Pharmacotherapy Assessment REMS (Risk Evaluation and Mitigation Strategy)  Analgesic:Oxycodone IR 52m every 8hours (1331mday) MME/day:22.31m531may BroChauncey [name]  12/29/2017  1:23 PM  Sign at close encounter Nursing Pain Medication Assessment:  Safety precautions to be maintained throughout the outpatient stay will include: orient to surroundings, keep bed in low position, maintain call bell within reach at all times, provide assistance with transfer out of bed and ambulation.  Medication Inspection Compliance: Pill count conducted under aseptic conditions, in front of the patient. Neither the pills nor the bottle was removed from the patient's sight at any time. Once count was completed pills were immediately returned to the patient in their original bottle.  Medication: Oxycodone IR Pill/Patch Count: 28 of 90 pills remain Pill/Patch Appearance: Markings consistent with prescribed medication Bottle Appearance: Standard pharmacy container. Clearly labeled. Filled Date: 10 225 / 2018 Last Medication intake:  Today   Pharmacokinetics: Liberation and absorption (onset of action): WNL Distribution (time to peak effect): WNL Metabolism and excretion (duration of action): WNL         Pharmacodynamics: Desired effects: Analgesia:  Ms. AllLindquistports >50% benefit. Functional ability: Patient reports that medication allows her to accomplish basic ADLs Clinically meaningful improvement in function (CMIF): Sustained CMIF goals met Perceived effectiveness: Described as relatively effective, allowing for increase in activities of daily living (ADL) Undesirable effects: Side-effects or Adverse reactions: None reported Monitoring: [place] PMP: Online review of the past 12-14-monthiod conducted. Compliant with practice rules and regulations Last UDS on record: Summary  Date Value Ref Range Status  05/27/2017 FINAL  Final    Comment:    ==================================================================== TOXASSURE SELECT 13 (MW) ==================================================================== Test                             Result       Flag       Units Drug Present and Declared for Prescription Verification   Oxycodone                      427          EXPECTED   ng/mg creat   Oxymorphone                    421          EXPECTED   ng/mg creat   Noroxycodone                   273          EXPECTED   ng/mg creat    Sources of oxycodone include scheduled prescription medications.    Oxymorphone and noroxycodone are expected metabolites of    oxycodone. Oxymorphone is also available as a scheduled    prescription medication. ==================================================================== Test                      Result    Flag   Units  Ref Range   Creatinine              48               mg/dL      >=20 ==================================================================== Declared Medications:  The flagging and interpretation on this report are based on the  following declared medications.  Unexpected results may arise from  inaccuracies in the declared medications.  **Note: The testing scope of this panel includes these medications:  Oxycodone  **Note: The testing scope of this panel does not include  following  reported medications:  Amlodipine Besylate  Atorvastatin  Calcitriol  Cetirizine (Zyrtec)  Clobetasol (Temovate)  Duloxetine  Glimepiride (Amaryl)  Insulin (Humalog)  Insulin (Lantus)  Levothyroxine  Metoprolol  Nystatin  Telmisartan (Micardis)  Vitamin D2  Vitamin D2 (Drisdol) ==================================================================== For clinical consultation, please call [phone]. ====================================================================    UDS interpretation: Compliant          Medication Assessment Form: Reviewed. Patient indicates being compliant with therapy Treatment compliance: Compliant Risk Assessment Profile: Aberrant behavior: See prior evaluations. None observed or detected today Comorbid factors increasing risk of overdose: See prior notes. No additional risks detected today Opioid risk tool (ORT) (Total Score): 0 Personal History of Substance Abuse (SUD-Substance use disorder):  Alcohol: Negative  Illegal Drugs: Negative  Rx Drugs: Negative  ORT Risk Level calculation: Low Risk Risk of substance use disorder (SUD): Low Opioid Risk Tool - 12/29/17 1333      Family History of Substance Abuse   Alcohol  Negative    Illegal Drugs  Negative    Rx Drugs  Negative      Personal History of Substance Abuse   Alcohol  Negative    Illegal Drugs  Negative    Rx Drugs  Negative      Age   Age between 26-45 years   No      History of Preadolescent Sexual Abuse   History of Preadolescent Sexual Abuse  Negative or Female      Psychological Disease   Psychological Disease  Negative    Depression  Negative      Total Score   Opioid Risk Tool Scoring  0    Opioid Risk Interpretation  Low Risk      ORT Scoring interpretation table:  Score <3 = Low Risk for SUD  Score between 4-7 = Moderate Risk for SUD  Score >8 = High Risk for Opioid Abuse   Risk Mitigation Strategies:  Patient Counseling: Covered Patient-Prescriber  Agreement (PPA): Present and active  Notification to other healthcare providers: Done  Pharmacologic Plan: No change in therapy, at this time.             Laboratory Chemistry  Inflammation Markers (CRP: Acute Phase) (ESR: Chronic Phase) Lab Results  Component Value Date   CRP 0.6 11/07/2015   ESRSEDRATE 16 11/07/2015   LATICACIDVEN 0.8 02/21/2015                         Rheumatology Markers Lab Results  Component Value Date   LABURIC 7.6 (H) 07/14/2015                        Renal Function Markers Lab Results  Component Value Date   BUN 45 (H) 07/24/2017   CREATININE 2.42 (H) 07/24/2017   GFRAA 23 (L) 07/24/2017   GFRNONAA 20 (L) 07/24/2017  Hepatic Function Markers Lab Results  Component Value Date   AST 19 07/24/2017   ALT 14 07/24/2017   ALBUMIN 3.8 07/24/2017   ALKPHOS 59 07/24/2017   LIPASE 21 02/04/2015                        Electrolytes Lab Results  Component Value Date   NA 138 07/24/2017   K 4.8 07/24/2017   CL 107 07/24/2017   CALCIUM 9.1 07/24/2017   MG 2.2 11/07/2015   PHOS 4.2 07/21/2015                        Neuropathy Markers Lab Results  Component Value Date   VITAMINB12 273 11/07/2015   HGBA1C 9.2 (H) 02/04/2015                        CNS Tests No results found for: COLORCSF, APPEARCSF, RBCCOUNTCSF, WBCCSF, POLYSCSF, LYMPHSCSF, EOSCSF, PROTEINCSF, GLUCCSF, JCVIRUS, CSFOLI, IGGCSF                      Bone Pathology Markers Lab Results  Component Value Date   25OHVITD1 43 11/07/2015   25OHVITD2 38 11/07/2015   25OHVITD3 5.4 11/07/2015                         Coagulation Parameters Lab Results  Component Value Date   INR 1.28 02/22/2015   LABPROT 16.1 (H) 02/22/2015   APTT 36 02/22/2015   PLT 239 07/24/2017                        Cardiovascular Markers Lab Results  Component Value Date   TROPONINI 0.08 (H) 07/19/2015   HGB 12.8 07/24/2017   HCT 39.7 07/24/2017                         CA  Markers No results found for: CEA, CA125, LABCA2                      Note: Lab results reviewed.  Recent Diagnostic Imaging Results  MR CERVICAL SPINE WO CONTRAST CLINICAL DATA:  67 year old with posterior neck pain radiating into the left shoulder for a couple months. Recent bilateral hand numbness. Previous cervical fusion.  EXAM: MRI CERVICAL SPINE WITHOUT CONTRAST  TECHNIQUE: Multiplanar, multisequence MR imaging of the cervical spine was performed. No intravenous contrast was administered.  COMPARISON:  Radiographs 09/06/2015. Report only from cervical MRI 12/16/2001.  FINDINGS: Alignment: Normal.  Vertebrae: Status post C4-6 ACDF. Interbody fusion appears solid. T1 hemangioma noted. No acute osseous findings are seen.  Cord: There is cord compression at C3-4 with probable cord hyperintensity, best seen on the axial images. The cervical cord is otherwise normal in signal and caliber.  Posterior Fossa, vertebral arteries, paraspinal tissues: Visualized portions of the posterior fossa and paraspinal soft tissues appear unremarkable. Bilateral vertebral artery flow voids.  Disc levels:  C2-3: Asymmetric facet hypertrophy on the right contributes to mild-to-moderate right foraminal narrowing. No cord deformity. The left foramen is patent.  C3-4: There is a large central disc extrusion, best seen on sagittal series 3. This markedly compresses the cervical cord, narrowing the AP diameter of the canal to approximately 4 mm. There is mild asymmetric facet hypertrophy on the right, contributing to mild right foraminal narrowing. The left  foramen appears patent.  C4-5: Solid interbody and posterolateral fusion. No spinal stenosis, foraminal narrowing or cord deformity.  C5-6: Solid interbody and posterolateral fusion. No spinal stenosis, foraminal narrowing or cord deformity.  C6-7: Mild disc bulging with mild left-greater-than-right facet hypertrophy. Mild left  foraminal narrowing. No cord deformity.  C7-T1: There is spondylosis with posterior osteophytes covering diffusely bulging disc material. Mild bilateral facet hypertrophy. No cord deformity. Mild foraminal narrowing bilaterally.  IMPRESSION: 1. Large disc extrusion at C3-4 with resulting cord compression. There is increased signal in the cord, suspicious for edema or early myelomalacia. Neuro surgical evaluation recommended. 2. Solid interbody fusion post C4-6 ACDF. No residual spinal stenosis at the operative levels. 3. Mild spondylosis at C6-7 and C7-T1 without cord deformity or high-grade foraminal narrowing. 4. Critical Value/emergent results were called by telephone at the time of interpretation on 08/01/2017 at 3:34 pm to Dr. Ramonita Lab , who verbally acknowledged these results.  Electronically Signed   By: Richardean Sale M.D.   On: 08/01/2017 15:34  Complexity Note: Imaging results reviewed. Results shared with [name], using Layman's terms.                         Meds   Current Outpatient Medications:  .  amLODipine (NORVASC) 5 MG tablet, Take 1 tablet (5 mg total) by mouth daily., Disp: 30 tablet, Rfl: 1 .  apixaban (ELIQUIS) 5 MG TABS tablet, Take 5 mg by mouth 2 (two) times daily., Disp: , Rfl:  .  atorvastatin (LIPITOR) 40 MG tablet, Take 40 mg by mouth daily at 6 PM. , Disp: , Rfl:  .  clobetasol cream (TEMOVATE) 9.15 %, Apply 1 application topically daily., Disp: , Rfl:  .  DULoxetine (CYMBALTA) 30 MG capsule, Take 30 mg by mouth daily. , Disp: , Rfl:  .  glucose blood (ONE TOUCH ULTRA TEST) test strip, TEST TWICE DAILY, Disp: , Rfl:  .  glucose blood (ONE TOUCH ULTRA TEST) test strip, TEST TWICE DAILY, Disp: , Rfl:  .  insulin glargine (LANTUS) 100 UNIT/ML injection, Inject 24 Units into the skin at bedtime. , Disp: , Rfl:  .  insulin lispro (HUMALOG KWIKPEN) 100 UNIT/ML KiwkPen, Take up to 50 units daily in divided doses, as directed., Disp: , Rfl:  .   levothyroxine (SYNTHROID, LEVOTHROID) 88 MCG tablet, TAKE 1 TABLET BY MOUTH ONCE A DAY FOR THYROID, Disp: , Rfl:  .  ONETOUCH DELICA LANCETS 05W MISC, TEST TWICE DAILY, Disp: , Rfl:  .  [START ON 03/02/2018] oxyCODONE (OXY IR/ROXICODONE) 5 MG immediate release tablet, Take 1 tablet (5 mg total) by mouth every 8 (eight) hours as needed for severe pain., Disp: 90 tablet, Rfl: 0 .  [START ON 01/31/2018] oxyCODONE (OXY IR/ROXICODONE) 5 MG immediate release tablet, Take 1 tablet (5 mg total) by mouth every 8 (eight) hours as needed for severe pain., Disp: 90 tablet, Rfl: 0 .  [START ON 01/01/2018] oxyCODONE (OXY IR/ROXICODONE) 5 MG immediate release tablet, Take 1 tablet (5 mg total) by mouth every 8 (eight) hours as needed for severe pain., Disp: 90 tablet, Rfl: 0  ROS  Constitutional: Denies any fever or chills Gastrointestinal: No reported hemesis, hematochezia, vomiting, or acute GI distress Musculoskeletal: Denies any acute onset joint swelling, redness, loss of ROM, or weakness Neurological: No reported episodes of acute onset apraxia, aphasia, dysarthria, agnosia, amnesia, paralysis, loss of coordination, or loss of consciousness  Allergies  [name] is allergic to buspirone;  citalopram; lisinopril; metformin; pravastatin; sitagliptin; tramadol; diltiazem hcl; gabapentin; hydralazine; and lovastatin.  [place]  Drug: [name]  reports that she does not use drugs. Alcohol:  reports that she does not drink alcohol. Tobacco:  reports that she quit smoking about 4 years ago. Her smoking use included cigarettes. She has never used smokeless tobacco. Medical:  has a past medical history of Anemia, Anxiety, Aortic valve stenosis, Arthritis, B12 deficiency, Bowel obstruction ([place]), CKD (chronic kidney disease), Colostomy in place [facility]), Diabetes mellitus without complication ([place]), Diverticulitis large intestine, Dysrhythmia, Ectopic atrial tachycardia ([place]), FSGS (focal segmental glomerulosclerosis),  Gastritis, GI bleed, Heart murmur, Hyperlipidemia, Hypertension, Hypothyroidism, Hypothyroidism, MRSA (methicillin resistant Staphylococcus aureus), Neuropathy, Neuropathy in diabetes ([place]), Obesity, Pedal edema, Psoriasis, Shortness of breath dyspnea, and Vertigo. Surgical: [name]  has a past surgical history that includes Colectomy; laparotomy closure of cecal perforation (05/09/2013); Tonsillectomy; Back surgery; Cardiac catheterization; Abdominal hysterectomy; Debridement of abdominal wall abscess (N/A, 02/22/2015); Excision mass abdominal (N/A, 02/24/2015); Application if wound vac (N/A, 02/24/2015); Excision mass abdominal (N/A, 02/26/2015); Application if wound vac (N/A, 02/26/2015); Wound debridement (N/A, 03/03/2015); Wound debridement (N/A, 03/09/2015); Flexible sigmoidoscopy (06/19/2015); Colostomy reversal (N/A, 07/12/2015); laparotomy (07/12/2015); laparoscopy (07/12/2015); Lysis of adhesion (07/12/2015); laparotomy (N/A, 02/06/2015); Colonoscopy with propofol (N/A, 09/04/2016); and Cardiac valve replacement (08/2017). Family: family history includes CAD in her father; Colon cancer in her brother; Diabetes in her mother; Heart attack in her father; Hypertension in her father.  Constitutional Exam  General appearance: Well nourished, well developed, and well hydrated. In no apparent acute distress Vitals:   12/29/17 1324  BP: (!) 146/78  Pulse: 72  Temp: 98 F (36.7 C)  SpO2: 96%  Weight: 225 lb (102.1 kg)  Height: '5\' 3"'  (1.6 m)  Psych/Mental status: Alert, oriented x 3 (person, place, & time)       Eyes: PERLA Respiratory: No evidence of acute respiratory distress  Cervical Spine Area Exam  Skin & Axial Inspection: No masses, redness, edema, swelling, or associated skin lesions Alignment: Symmetrical Functional ROM: Unrestricted ROM      Stability: No instability detected Muscle Tone/Strength: Functionally intact. No obvious neuro-muscular anomalies detected. Sensory (Neurological):  Unimpaired Palpation: No palpable anomalies              Upper Extremity (UE) Exam    Side: Right upper extremity  Side: Left upper extremity  Skin & Extremity Inspection: Erythematous rash  Skin & Extremity Inspection: Erythematous rash  Functional ROM: Unrestricted ROM          Functional ROM: Unrestricted ROM          Muscle Tone/Strength: Functionally intact. No obvious neuro-muscular anomalies detected.  Muscle Tone/Strength: Functionally intact. No obvious neuro-muscular anomalies detected.  Sensory (Neurological): Unimpaired          Sensory (Neurological): Unimpaired          Palpation: No palpable anomalies              Palpation: No palpable anomalies                   Thoracic Spine Area Exam  Skin & Axial Inspection: No masses, redness, or swelling Alignment: Symmetrical Functional ROM: Unrestricted ROM Stability: No instability detected Muscle Tone/Strength: Functionally intact. No obvious neuro-muscular anomalies detected. Sensory (Neurological): Unimpaired Muscle strength & Tone: No palpable anomalies  Lumbar Spine Area Exam  Skin & Axial Inspection: No masses, redness, or swelling Alignment: Symmetrical Functional ROM: Unrestricted ROM       Stability: No  instability detected Muscle Tone/Strength: Functionally intact. No obvious neuro-muscular anomalies detected. Sensory (Neurological): Unimpaired Palpation: No palpable anomalies         Gait & Posture Assessment  Ambulation: Unassisted Gait: Relatively normal for age and body habitus Posture: WNL   Lower Extremity Exam    Side: Right lower extremity  Side: Left lower extremity  Stability: No instability observed          Stability: No instability observed          Skin & Extremity Inspection: Skin color, temperature, and hair growth are WNL. No peripheral edema or cyanosis. No masses, redness, swelling, asymmetry, or associated skin lesions. No contractures.  Skin & Extremity Inspection: Skin color,  temperature, and hair growth are WNL. No peripheral edema or cyanosis. No masses, redness, swelling, asymmetry, or associated skin lesions. No contractures.  Functional ROM: Unrestricted ROM                  Functional ROM: Unrestricted ROM                  Muscle Tone/Strength: Functionally intact. No obvious neuro-muscular anomalies detected.  Muscle Tone/Strength: Functionally intact. No obvious neuro-muscular anomalies detected.  Sensory (Neurological): Unimpaired  Sensory (Neurological): Unimpaired  Palpation: No palpable anomalies  Palpation: No palpable anomalies   Assessment  Primary Diagnosis & Pertinent Problem List: The primary encounter diagnosis was Lumbar spondylosis. Diagnoses of Chronic abdominal pain (Location of Primary Source of Pain) (Bilateral) (L>R), Neurogenic pain, Musculoskeletal pain, and Chronic pain syndrome were also pertinent to this [name].  Status Diagnosis  Persistent Persistent Persistent 1. Lumbar spondylosis   2. Chronic abdominal pain (Location of Primary Source of Pain) (Bilateral) (L>R)   3. Neurogenic pain   4. Musculoskeletal pain   5. Chronic pain syndrome     Problems updated and reviewed during this [name]: Problem  Anemia  Ckd (Chronic Kidney Disease) Stage 4, Gfr 15-29 Ml/Min ([name])  Diabetes Mellitus Type 2 in Obese ([name])  DM Type 2 With Diabetic Peripheral Neuropathy ([name])  Cervical Myelopathy ([name])   S/p C3-C4 ACDF 8/19   Morbid Obesity ([name])   BMI greater than 35 with hyperlipidemia, diabetes, chronic kidney disease    Plan of Care  Pharmacotherapy (Medications Ordered): Meds ordered this encounter  Medications  . oxyCODONE (OXY IR/ROXICODONE) 5 MG immediate release tablet    Sig: Take 1 tablet (5 mg total) by mouth every 8 (eight) hours as needed for severe pain.    Dispense:  90 tablet    Refill:  0    Do not add this medication to the electronic "Automatic Refill" notification system. Patient may have prescription filled one  day early if pharmacy is closed on scheduled refill date.    Order Specific Question:   Supervising Provider    Answer:   Milinda Pointer [phone]  . oxyCODONE (OXY IR/ROXICODONE) 5 MG immediate release tablet    Sig: Take 1 tablet (5 mg total) by mouth every 8 (eight) hours as needed for severe pain.    Dispense:  90 tablet    Refill:  0    Do not add this medication to the electronic "Automatic Refill" notification system. Patient may have prescription filled one day early if pharmacy is closed on scheduled refill date.    Order Specific Question:   Supervising Provider    Answer:   Milinda Pointer [phone]  . oxyCODONE (OXY IR/ROXICODONE) 5 MG immediate release tablet    Sig: Take 1 tablet (  5 mg total) by mouth every 8 (eight) hours as needed for severe pain.    Dispense:  90 tablet    Refill:  0    Do not add this medication to the electronic "Automatic Refill" notification system. Patient may have prescription filled one day early if pharmacy is closed on scheduled refill date.    Order Specific Question:   Supervising Provider    Answer:   Milinda Pointer [[id number]]   New Prescriptions   No medications on file   Medications administered today: [name]. Lab-work, procedure(s), and/or referral(s): No orders of the defined types were placed in this encounter.  Imaging and/or referral(s): None  Interventional therapies: Planned, scheduled, and/or pending:  Not at this time.   Considering:  Diagnostic bilateral lumbar facet block  Possible bilateral lumbar facet rate frequency ablation.  Diagnostic bilateral celiac plexus block  Diagnostic bilateral intra-articular hip injection  Possible diagnostic bilateral femoral nerve and obturator nerve articular branch blocks  Possible bilateral femoral nerve and obturator nerve articular branch frequency ablation.  Diagnostic bilateral intra-articular knee joint  injection  Possible series of 5 bilateral intra-articular Hyalgan knee injections.  Bilateral diagnostic genicular nerve block  Possible bilateral genicular nerve radiofrequency ablation    Palliative PRN treatment(s):  Diagnostic bilateral lumbar facet block  Diagnostic bilateral celiac plexus block  Diagnostic bilateral intra-articular hip injection  Possible diagnostic bilateral femoral nerve and obturator nerve articular branch blocks  Diagnostic bilateral intra-articular knee joint injection Bilateral diagnostic genicular nerve block     Provider-requested follow-up: Return in about 3 months (around 03/31/2018) for MedMgmt.  Future Appointments  Date Time Provider [place]  03/23/2018  1:30 PM Vevelyn Francois, NP [facility] None   Primary Care Physician: Adin Hector, MD Location: [facility] Note by: Vevelyn Francois NP Date: 12/29/2017; Time: 3:19 PM  Pain Score Disclaimer: We use the NRS-11 scale. This is a self-reported, subjective measurement of pain severity with only modest accuracy. It is used primarily to identify changes within a particular patient. It must be understood that outpatient pain scales are significantly less accurate that those used for research, where they can be applied under ideal controlled circumstances with minimal exposure to variables. In reality, the score is likely to be a combination of pain intensity and pain affect, where pain affect describes the degree of emotional arousal or changes in action readiness caused by the sensory experience of pain. Factors such as social and work situation, setting, emotional state, anxiety levels, expectation, and prior pain experience may influence pain perception and show large inter-individual differences that may also be affected by time variables.  Patient instructions provided during this appointment: Patient Instructions   __oxyCODONE (OXY IR/ROXICODONE) 5 MG  immediate release tablet sent to your pharmacy to last until 04/01/18.__________________________________________________________________________________________  Medication Rules  Applies to: All patients receiving prescriptions (written or electronic).  Pharmacy of record: Pharmacy where electronic prescriptions will be sent. If written prescriptions are taken to a different pharmacy, please inform the nursing staff. The pharmacy listed in the electronic medical record should be the one where you would like electronic prescriptions to be sent.  Prescription refills: Only during scheduled appointments. Applies to both, written and electronic prescriptions.  NOTE: The following applies primarily to controlled substances (Opioid* Pain Medications).   Patient's responsibilities: 1. Pain Pills: Bring all pain pills to every appointment (except for procedure appointments). 2. Pill Bottles: Bring pills in  original pharmacy bottle. Always bring newest bottle. Bring bottle, even if empty. 3. Medication refills: You are responsible for knowing and keeping track of what medications you need refilled. The day before your appointment, write a list of all prescriptions that need to be refilled. Bring that list to your appointment and give it to the admitting nurse. Prescriptions will be written only during appointments. If you forget a medication, it will not be "Called in", "Faxed", or "electronically sent". You will need to get another appointment to get these prescribed. 4. Prescription Accuracy: You are responsible for carefully inspecting your prescriptions before leaving our office. Have the discharge nurse carefully go over each prescription with you, before taking them home. Make sure that your name is accurately spelled, that your address is correct. Check the name and dose of your medication to make sure it is accurate. Check the number of pills, and the written instructions to make sure they are clear  and accurate. Make sure that you are given enough medication to last until your next medication refill appointment. 5. Taking Medication: Take medication as prescribed. Never take more pills than instructed. Never take medication more frequently than prescribed. Taking less pills or less frequently is permitted and encouraged, when it comes to controlled substances (written prescriptions).  6. Inform other Doctors: Always inform, all of your healthcare providers, of all the medications you take. 7. Pain Medication from other Providers: You are not allowed to accept any additional pain medication from any other Doctor or Healthcare provider. There are two exceptions to this rule. (see below) In the event that you require additional pain medication, you are responsible for notifying us, as stated below. 8. Medication Agreement: You are responsible for carefully reading and following our Medication Agreement. This must be signed before receiving any prescriptions from our practice. Safely store a copy of your signed Agreement. Violations to the Agreement will result in no further prescriptions. (Additional copies of our Medication Agreement are available upon request.) 9. Laws, Rules, & Regulations: All patients are expected to follow all Federal and [employer], [employer], Rules, [employer]. Ignorance of the Laws does not constitute a valid excuse. The use of any illegal substances is prohibited. 10. Adopted CDC guidelines & recommendations: Target dosing levels will be at or below 60 MME/day. Use of benzodiazepines** is not recommended.  Exceptions: There are only two exceptions to the rule of not receiving pain medications from other Healthcare Providers. 1. Exception #1 (Emergencies): In the event of an emergency (i.e.: accident requiring emergency care), you are allowed to receive additional pain medication. However, you are responsible for: As soon as you are able, call our office [phone], at any  time of the day or night, and leave a message stating your name, the date and nature of the emergency, and the name and dose of the medication prescribed. In the event that your call is answered by a member of our staff, make sure to document and save the date, time, and the name of the person that took your information.  2. Exception #2 (Planned Surgery): In the event that you are scheduled by another doctor or dentist to have any type of surgery or procedure, you are allowed (for a period no longer than 30 days), to receive additional pain medication, for the acute post-op pain. However, in this case, you are responsible for picking up a copy of our "Post-op Pain Management for Surgeons" handout, and giving it to your surgeon or dentist. This  document is available at our office, and does not require an appointment to obtain it. Simply go to our office during business hours (Monday-Thursday from 8:00 AM to 4:00 PM) (Friday 8:00 AM to 12:00 Noon) or if you have a scheduled appointment with [place], prior to your surgery, and ask for it by name. In addition, you will need to provide [place] with your name, name of your surgeon, type of surgery, and date of procedure or surgery.  *Opioid medications include: morphine, codeine, oxycodone, oxymorphone, hydrocodone, hydromorphone, meperidine, tramadol, tapentadol, buprenorphine, fentanyl, methadone. **Benzodiazepine medications include: diazepam (Valium), alprazolam (Xanax), clonazepam (Klonopine), lorazepam (Ativan), clorazepate (Tranxene), chlordiazepoxide (Librium), estazolam (Prosom), oxazepam (Serax), temazepam (Restoril), triazolam (Halcion) (Last updated: 04/17/2017) ____________________________________________________________________________________________    BMI Assessment: Estimated body mass index is 39.86 kg/m as calculated from the following:   Height as of this encounter: '5\' 3"'  (1.6 m).   Weight as of this encounter: 225 lb (102.1 kg).  BMI  interpretation table: BMI level Category Range association with higher incidence of chronic pain  <18 kg/m2 Underweight   18.5-24.9 kg/m2 Ideal body weight   25-29.9 kg/m2 Overweight Increased incidence by 20%  30-34.9 kg/m2 Obese (Class I) Increased incidence by 68%  35-39.9 kg/m2 Severe obesity (Class II) Increased incidence by 136%  >40 kg/m2 Extreme obesity (Class III) Increased incidence by 254%   Patient's current BMI Ideal Body weight  Body mass index is 39.86 kg/m. Ideal body weight: 52.4 kg (115 lb 8.3 oz) Adjusted ideal body weight: 72.3 kg (159 lb 5 oz)   BMI Readings from Last 4 Encounters:  12/29/17 39.86 kg/m  10/01/17 38.09 kg/m  07/24/17 38.26 kg/m  05/27/17 38.62 kg/m   Wt Readings from Last 4 Encounters:  12/29/17 225 lb (102.1 kg)  10/01/17 215 lb (97.5 kg)  07/24/17 216 lb (98 kg)  05/27/17 218 lb (98.9 kg)

## 2017-12-29 NOTE — Progress Notes (Signed)
Nursing Pain Medication Assessment:  Safety precautions to be maintained throughout the outpatient stay will include: orient to surroundings, keep bed in low position, maintain call bell within reach at all times, provide assistance with transfer out of bed and ambulation.  Medication Inspection Compliance: Pill count conducted under aseptic conditions, in front of the patient. Neither the pills nor the bottle was removed from the patient's sight at any time. Once count was completed pills were immediately returned to the patient in their original bottle.  Medication: Oxycodone IR Pill/Patch Count: 28 of 90 pills remain Pill/Patch Appearance: Markings consistent with prescribed medication Bottle Appearance: Standard pharmacy container. Clearly labeled. Filled Date: 5 / 25 / 2018 Last Medication intake:  Today

## 2017-12-29 NOTE — Patient Instructions (Addendum)
__oxyCODONE (OXY IR/ROXICODONE) 5 MG immediate release tablet sent to your pharmacy to last until 04/01/18.__________________________________________________________________________________________  Medication Rules  Applies to: All patients receiving prescriptions (written or electronic).  Pharmacy of record: Pharmacy where electronic prescriptions will be sent. If written prescriptions are taken to a different pharmacy, please inform the nursing staff. The pharmacy listed in the electronic medical record should be the one where you would like electronic prescriptions to be sent.  Prescription refills: Only during scheduled appointments. Applies to both, written and electronic prescriptions.  NOTE: The following applies primarily to controlled substances (Opioid* Pain Medications).   Patient's responsibilities: 1. Pain Pills: Bring all pain pills to every appointment (except for procedure appointments). 2. Pill Bottles: Bring pills in original pharmacy bottle. Always bring newest bottle. Bring bottle, even if empty. 3. Medication refills: You are responsible for knowing and keeping track of what medications you need refilled. The day before your appointment, write a list of all prescriptions that need to be refilled. Bring that list to your appointment and give it to the admitting nurse. Prescriptions will be written only during appointments. If you forget a medication, it will not be "Called in", "Faxed", or "electronically sent". You will need to get another appointment to get these prescribed. 4. Prescription Accuracy: You are responsible for carefully inspecting your prescriptions before leaving our office. Have the discharge nurse carefully go over each prescription with you, before taking them home. Make sure that your name is accurately spelled, that your address is correct. Check the name and dose of your medication to make sure it is accurate. Check the number of pills, and the written  instructions to make sure they are clear and accurate. Make sure that you are given enough medication to last until your next medication refill appointment. 5. Taking Medication: Take medication as prescribed. Never take more pills than instructed. Never take medication more frequently than prescribed. Taking less pills or less frequently is permitted and encouraged, when it comes to controlled substances (written prescriptions).  6. Inform other Doctors: Always inform, all of your healthcare providers, of all the medications you take. 7. Pain Medication from other Providers: You are not allowed to accept any additional pain medication from any other Doctor or Healthcare provider. There are two exceptions to this rule. (see below) In the event that you require additional pain medication, you are responsible for notifying us, as stated below. 8. Medication Agreement: You are responsible for carefully reading and following our Medication Agreement. This must be signed before receiving any prescriptions from our practice. Safely store a copy of your signed Agreement. Violations to the Agreement will result in no further prescriptions. (Additional copies of our Medication Agreement are available upon request.) 9. Laws, Rules, & Regulations: All patients are expected to follow all Federal and [employer], [employer], Rules, [employer]. Ignorance of the Laws does not constitute a valid excuse. The use of any illegal substances is prohibited. 10. Adopted CDC guidelines & recommendations: Target dosing levels will be at or below 60 MME/day. Use of benzodiazepines** is not recommended.  Exceptions: There are only two exceptions to the rule of not receiving pain medications from other Healthcare Providers. 1. Exception #1 (Emergencies): In the event of an emergency (i.e.: accident requiring emergency care), you are allowed to receive additional pain medication. However, you are responsible for: As soon as you are  able, call our office [phone], at any time of the day or night, and leave a message stating your name,  the date and nature of the emergency, and the name and dose of the medication prescribed. In the event that your call is answered by a member of our staff, make sure to document and save the date, time, and the name of the person that took your information.  2. Exception #2 (Planned Surgery): In the event that you are scheduled by another doctor or dentist to have any type of surgery or procedure, you are allowed (for a period no longer than 30 days), to receive additional pain medication, for the acute post-op pain. However, in this case, you are responsible for picking up a copy of our "Post-op Pain Management for Surgeons" handout, and giving it to your surgeon or dentist. This document is available at our office, and does not require an appointment to obtain it. Simply go to our office during business hours (Monday-Thursday from 8:00 AM to 4:00 PM) (Friday 8:00 AM to 12:00 Noon) or if you have a scheduled appointment with [place], prior to your surgery, and ask for it by name. In addition, you will need to provide [place] with your name, name of your surgeon, type of surgery, and date of procedure or surgery.  *Opioid medications include: morphine, codeine, oxycodone, oxymorphone, hydrocodone, hydromorphone, meperidine, tramadol, tapentadol, buprenorphine, fentanyl, methadone. **Benzodiazepine medications include: diazepam (Valium), alprazolam (Xanax), clonazepam (Klonopine), lorazepam (Ativan), clorazepate (Tranxene), chlordiazepoxide (Librium), estazolam (Prosom), oxazepam (Serax), temazepam (Restoril), triazolam (Halcion) (Last updated: 04/17/2017) ____________________________________________________________________________________________    BMI Assessment: Estimated body mass index is 39.86 kg/m as calculated from the following:   Height as of this encounter: 5\' 3"  (1.6 m).   Weight as of this  encounter: 225 lb (102.1 kg).  BMI interpretation table: BMI level Category Range association with higher incidence of chronic pain  <18 kg/m2 Underweight   18.5-24.9 kg/m2 Ideal body weight   25-29.9 kg/m2 Overweight Increased incidence by 20%  30-34.9 kg/m2 Obese (Class I) Increased incidence by 68%  35-39.9 kg/m2 Severe obesity (Class II) Increased incidence by 136%  >40 kg/m2 Extreme obesity (Class III) Increased incidence by 254%   Patient's current BMI Ideal Body weight  Body mass index is 39.86 kg/m. Ideal body weight: 52.4 kg (115 lb 8.3 oz) Adjusted ideal body weight: 72.3 kg (159 lb 5 oz)   BMI Readings from Last 4 Encounters:  12/29/17 39.86 kg/m  10/01/17 38.09 kg/m  07/24/17 38.26 kg/m  05/27/17 38.62 kg/m   Wt Readings from Last 4 Encounters:  12/29/17 225 lb (102.1 kg)  10/01/17 215 lb (97.5 kg)  07/24/17 216 lb (98 kg)  05/27/17 218 lb (98.9 kg)

## 2018-02-17 ENCOUNTER — Encounter: Payer: Self-pay | Admitting: Physical Therapy

## 2018-02-17 ENCOUNTER — Ambulatory Visit: Payer: Medicare Other | Attending: Neurosurgery | Admitting: Physical Therapy

## 2018-02-17 DIAGNOSIS — M6281 Muscle weakness (generalized): Secondary | ICD-10-CM | POA: Insufficient documentation

## 2018-02-17 DIAGNOSIS — M2560 Stiffness of unspecified joint, not elsewhere classified: Secondary | ICD-10-CM | POA: Diagnosis present

## 2018-02-17 DIAGNOSIS — R293 Abnormal posture: Secondary | ICD-10-CM

## 2018-02-17 DIAGNOSIS — M542 Cervicalgia: Secondary | ICD-10-CM | POA: Diagnosis not present

## 2018-02-17 NOTE — Therapy (Signed)
[facility] [address]. [place], [place], [address] Phone: [phone]   Fax:  [phone]  Physical Therapy Evaluation  Patient Details  Name: [name] MRN: [id number] Date of Birth: [birth year] Referring Provider (PT): Dr. Izora Ribas   Encounter Date: 02/17/2018  PT End of Session - 02/17/18 1453    Visit Number  1    Number of Visits  8    Date for PT Re-Evaluation  03/17/18    PT Start Time  1340    PT Stop Time  1442    PT Time Calculation (min)  62 min    Activity Tolerance  Patient tolerated treatment well;Patient limited by pain    Behavior During Therapy  [facility] for tasks assessed/performed       Past Medical History:  Diagnosis Date  . Anemia   . Anxiety   . Aortic valve stenosis   . Arthritis    feet, legs  . B12 deficiency   . Bowel obstruction ([place])   . CKD (chronic kidney disease)    protein in urine  . Colostomy in place [facility])   . Diabetes mellitus without complication ([place])   . Diverticulitis large intestine   . Dysrhythmia   . Ectopic atrial tachycardia ([place])   . FSGS (focal segmental glomerulosclerosis)   . Gastritis   . GI bleed   . Heart murmur   . Hyperlipidemia   . Hypertension   . Hypothyroidism   . Hypothyroidism    unspecified  . MRSA (methicillin resistant Staphylococcus aureus)    at abdominal wound.  Jan 2017.  Treated.   . Neuropathy   . Neuropathy in diabetes ([place])   . Obesity   . Pedal edema   . Psoriasis   . Shortness of breath dyspnea   . Vertigo     Past Surgical History:  Procedure Laterality Date  . ABDOMINAL HYSTERECTOMY    . APPLICATION OF WOUND VAC N/A 02/24/2015   Procedure: APPLICATION OF WOUND VAC;  Surgeon: Clayburn Pert, MD;  Location: [facility] ORS;  Service: General;  Laterality: N/A;  . APPLICATION OF WOUND VAC N/A 02/26/2015   Procedure: APPLICATION OF WOUND VAC;  Surgeon: Clayburn Pert, MD;  Location: [facility] ORS;  Service: General;  Laterality: N/A;  . BACK  SURGERY     spur frmoved from lower back  . CARDIAC CATHETERIZATION    . CARDIAC VALVE REPLACEMENT  08/2017   [facility]  . COLECTOMY    . COLONOSCOPY WITH PROPOFOL N/A 09/04/2016   Procedure: COLONOSCOPY WITH PROPOFOL;  Surgeon: Manya Silvas, MD;  Location: [facility] ENDOSCOPY;  Service: Endoscopy;  Laterality: N/A;  . COLOSTOMY REVERSAL N/A 07/12/2015   Procedure: COLOSTOMY REVERSAL;  Surgeon: Clayburn Pert, MD;  Location: [facility] ORS;  Service: General;  Laterality: N/A;  . DEBRIDEMENT OF ABDOMINAL WALL ABSCESS N/A 02/22/2015   Procedure: DEBRIDEMENT OF ABDOMINAL WALL ABSCESS;  Surgeon: Clayburn Pert, MD;  Location: [facility] ORS;  Service: General;  Laterality: N/A;  . EXCISION MASS ABDOMINAL N/A 02/24/2015   Procedure: EXCISION MASS ABDOMINAL  / [place];  Surgeon: Clayburn Pert, MD;  Location: [facility] ORS;  Service: General;  Laterality: N/A;  . EXCISION MASS ABDOMINAL N/A 02/26/2015   Procedure: EXCISION MASS ABDOMINAL/wash out;  Surgeon: Clayburn Pert, MD;  Location: [facility] ORS;  Service: General;  Laterality: N/A;  . FLEXIBLE SIGMOIDOSCOPY  06/19/2015   Procedure: FLEXIBLE SIGMOIDOSCOPY;  Surgeon: Lucilla Lame, MD;  Location: [place];  Service: Endoscopy;;  Karen Kays  TO ACCESS OSTOMY SITE FOR ACCESS INTO COLON  . LAPAROSCOPY  07/12/2015   Procedure: LAPAROSCOPY DIAGNOSTIC;  Surgeon: Clayburn Pert, MD;  Location: [facility]S;  Service: General;;  . LAPAROTOMY  07/12/2015   Procedure: EXPLORATORY LAPAROTOMY;  Surgeon: Clayburn Pert, MD;  Location: [facility];  Service: General;;  . LAPAROTOMY N/A 02/06/2015   Procedure: Laparotomy, reduction of incarcerated parastomal hernia, repair of parastomal hernia with mesh;  Surgeon: Sherri Rad, MD;  Location: [facility] ORS;  Service: General;  Laterality: N/A;  . laparotomy closure of cecal perforation  05/09/2013   Dr. Marina Gravel  . LYSIS OF ADHESION  07/12/2015   Procedure: LYSIS OF ADHESION;  Surgeon: Clayburn Pert, MD;  Location: [facility] ORS;  Service: General;;  .  TONSILLECTOMY    . WOUND DEBRIDEMENT N/A 03/03/2015   Procedure: DEBRIDEMENT ABDOMINAL WOUND;  Surgeon: Florene Glen, MD;  Location: [facility];  Service: General;  Laterality: N/A;  . WOUND DEBRIDEMENT N/A 03/09/2015   Procedure: DEBRIDEMENT ABDOMINAL WOUND;  Surgeon: Florene Glen, MD;  Location: [facility]S;  Service: General;  Laterality: N/A;    There were no vitals filed for this visit.   Subjective Assessment - 02/17/18 1441    Subjective  Pt. was being treated by PT last summer prior to MRI.  Pt. s/p ACDF at C3-4 level on 10/14/17 with residual hand numbness/ weakness noted.  Pt. reports 4/10 neck pain currently at rest, 0/10 at best and 6/10 at worst (increase activity).  Pt. ambulates with use of SPC for safety/ balance.  No falls reported.      Pertinent History  S/p ACDF 10+ years ago with improvement noted.  R handed.  Pt. enjoys coloring book/ read.     Limitations  Reading;Lifting;Standing;House hold activities    Diagnostic tests  see MRI    Patient Stated Goals  Increase UE strength/ posture/ pain-free mobility.      Currently in Pain?  Yes    Pain Score  4     Pain Location  Neck    Pain Orientation  Lower;Right;Left    Pain Descriptors / Indicators  Aching    Pain Type  Chronic pain    Pain Onset  More than a month ago         [facility] PT Assessment - 02/17/18 0001      Assessment   Medical Diagnosis  S/p C3-4 ACDF/ Cervical myelopathy    Referring Provider (PT)  Dr. Izora Ribas    Onset Date/Surgical Date  10/14/17    Hand Dominance  Right    Prior Therapy  yes, see notes.        Precautions   Precautions  None      Restrictions   Weight Bearing Restrictions  No      Balance Screen   Has the patient fallen in the past 6 months  No      Prior Function   Level of Independence  Independent with household mobility with device      Cognition   Overall Cognitive Status  Within Functional Limits for tasks assessed         See HEP.  Seated B UBE: 2 min.  F/b.      PT Education - 02/17/18 1452    Education provided  Yes    Education Details  See HEP    Person(s) Educated  Patient    Methods  Explanation;Demonstration;Handout    Comprehension  Verbalized understanding;Returned demonstration  PT Long Term Goals - 02/17/18 1456      PT LONG TERM GOAL #1   Title  Pt. independent with HEP to increase B UE strength 1/2 muscle grade to improve household chores/ reaching/ lifting.      Baseline  B shoulder strength grossly 4+/5 MMT. Grip strength: L (34#), R (31#).    Time  4    Period  Weeks    Status  New    Target Date  03/17/18      PT LONG TERM GOAL #2   Title  Pt. will increase FOTO from 44 to 52 to improve pain-free mobility.      Baseline  FOTO baseline 44 on 02/17/18    Time  4    Period  Weeks    Status  New    Target Date  03/17/18      PT LONG TERM GOAL #3   Title  Pt. will demonstate improved upright posture/ head position with reading and coloring with no increase c/o pain.      Baseline  Significant thoracic kyphosis/ forward head posture.      Time  4    Period  Weeks    Status  New    Target Date  03/17/18          Plan - 02/17/18 1409    Clinical Impression Statement  Pt. is a pleasant 67 y/o female with chronic h/o neck pain resulting in ACDF on 10/14/17.  B shoulder AROM WFL (no increase c/o neck pain).  B shoulder strength grossly 4+/5 MMT.  Grip strength: L (34#), R (31#).  Cervical AROM:  flexion (52 deg.), ext. (32 deg.), L rotn. (58 deg.), R rotn. (58 deg.), L lat. flex. (42 deg.), R lat. flex. (43 deg.)- a little discomfort in L neck.  Significant thoracic kyphosis/ forward head posture in sitting and standing.  Several B UT trigger points noted with palpation and reports of B hand numbness/ nerve involvement.  Pt. reports that she has not returned to driving or cooking secondary to hand numbness/ dropping things.  FOTO: baseline 44/ goal 52.  Pt. will benefit from skilled PT services to  increase cervical AROM/ stability to improve posture/ pain-free mobility with daily tasks.           Clinical Presentation  Stable    Clinical Presentation due to:  Pt. is a L/R sidesleeper.  Pt. reports she goes to sleep at 4AM because she can't sleep.  This has been an issue for a long time.  Pt. enjoys coloring books at night.      Clinical Decision Making  Low    Rehab Potential  Fair    PT Frequency  2x / week    PT Duration  4 weeks    PT Treatment/Interventions  ADLs/Self Care Home Management;Aquatic Therapy;Cryotherapy;Electrical Stimulation;Moist Heat;Functional mobility training;Therapeutic activities;Therapeutic exercise;Patient/family education;Neuromuscular re-education;Manual techniques;Dry needling;Passive range of motion    PT Next Visit Plan  Progress HEP/ add resisted ex    PT Home Exercise Plan  see handouts.       Patient will benefit from skilled therapeutic intervention in order to improve the following deficits and impairments:  Pain, Improper body mechanics, Postural dysfunction, Decreased mobility, Decreased activity tolerance, Decreased endurance, Decreased range of motion, Decreased strength, Hypomobility, Impaired flexibility, Impaired UE functional use, Decreased balance  Visit Diagnosis: Neck pain  Joint stiffness  Abnormal posture  Muscle weakness (generalized)     Problem List Patient Active Problem List  Diagnosis Date Noted  . DM type 2 with diabetic peripheral neuropathy ([place]) 11/25/2017  . 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 Acute kidney injury superimposed on chronic kidney disease ([place]) 09/01/2017  . CHF (congestive heart failure), NYHA class II, chronic, diastolic ([place]) 35/36/1443  . Paroxysmal A-fib ([place]) 08/14/2017  . Cervical myelopathy ([place]) 08/07/2017  . Onychomycosis of multiple toenails with type 2 diabetes mellitus and peripheral neuropathy ([place]) 06/23/2017  . Myofascial pain 05/27/2017  . Chronic pain  of left upper extremity 05/27/2017  . Morbid obesity ([place]) 05/06/2017  . Uncontrolled type 2 diabetes mellitus with hyperglycemia, with long-term current use of insulin ([place]) 02/07/2017  . Hyperlipidemia, mixed 08/08/2016  . Hx of adenomatous colonic polyps 06/14/2016  . 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 Chronic pain syndrome 03/11/2016  . Obesity, Class II, BMI 35-39.9 03/11/2016  . Osteopenia of multiple sites 01/26/2016  . Heart burn 12/18/2015  . Polyneuropathy 11/16/2015  . Lumbar spondylosis 09/25/2015  . Lumbar facet syndrome (Bilateral) (L>R) 09/25/2015  . Recurrent major depressive disorder, in full remission ([place]) 09/19/2015  . Diabetic neuropathy ([place]) 09/06/2015  . H/O colostomy 07/12/2015  . Chronic abdominal pain (Location of Primary Source of Pain) (Bilateral) (L>R) 07/03/2015  . Long term current use of opiate analgesic 07/03/2015  . Long term prescription opiate use 07/03/2015  . Opiate use (22.5 MME/Day) 07/03/2015  . Encounter for therapeutic drug level monitoring 07/03/2015  . Encounter for pain management planning 07/03/2015  . Neurogenic pain 07/03/2015  . Musculoskeletal pain 07/03/2015  . Visceral abdominal pain 07/03/2015  . Diabetic peripheral neuropathy ([place]) 07/03/2015  . Chronic low back pain (Location of Secondary source of pain) (Bilateral) (L>R) 07/03/2015  . Chronic lower extremity pain (Location of Tertiary source of pain) (Bilateral) (R>L) 07/03/2015  . Chronic hip pain (Bilateral) (L>R) 07/03/2015  . Chronic knee pain (Bilateral) (L>R) 07/03/2015  . Chronic neck pain (Bilateral) (L>R) 07/03/2015  . Anemia 02/16/2015  . Arthritis 02/16/2015  . CKD (chronic kidney disease) stage 4, GFR 15-29 ml/min (HCC) 02/16/2015  . Focal and segmental hyalinosis 02/16/2015  . Bleeding gastrointestinal 02/16/2015  . Acquired atrophy of thyroid 02/16/2015  . Psoriasis 02/16/2015  . Small bowel obstruction ([place]) 02/04/2015  . Severe aortic  stenosis 10/11/2014  . Paroxysmal supraventricular tachycardia ([place]) 10/11/2014  . SOBOE (shortness of breath on exertion) 10/11/2014  . Benign essential HTN 10/04/2014  . Obesity 09/05/2014  . B12 deficiency 03/02/2014  . Diabetes mellitus type 2 in obese ([place]) 12/01/2013  . Abnormal presence of protein in urine 10/15/2010   Pura Spice, PT, DPT # [phone] 02/17/2018, 6:12 PM  [facility] [address] [place], [place], [address] Phone: [phone]   Fax:  [phone]  Name: [name] MRN: [id number] Date of Birth: October 23, 1950

## 2018-02-17 NOTE — Patient Instructions (Signed)
Access Code: [email or web address]  URL: [email or web address]  Date: 02/17/2018  Prepared by: Dorcas Carrow   Exercises  Seated Scapular Retraction - 20 reps - 1 sets - 2x daily - 7x weekly  Standing Alternating Shoulder Flexion - 20 reps - 1 sets - 2x daily - 7x weekly  Standing Alternating Shoulder Abduction - 20 reps - 1 sets - 2x daily - 7x weekly  Putty Squeezes - 10 reps - 1 sets - 2x daily - 7x weekly  Finger Pinch and Pull with Putty - 10 reps - 1 sets - 2x daily - 7x weekly

## 2018-02-24 ENCOUNTER — Ambulatory Visit: Payer: Medicare Other | Admitting: Physical Therapy

## 2018-02-26 ENCOUNTER — Ambulatory Visit: Payer: Medicare Other | Admitting: Physical Therapy

## 2018-03-03 ENCOUNTER — Encounter: Payer: Self-pay | Admitting: Physical Therapy

## 2018-03-03 ENCOUNTER — Ambulatory Visit: Payer: Medicare Other | Attending: Neurosurgery | Admitting: Physical Therapy

## 2018-03-03 DIAGNOSIS — M2560 Stiffness of unspecified joint, not elsewhere classified: Secondary | ICD-10-CM | POA: Insufficient documentation

## 2018-03-03 DIAGNOSIS — M6281 Muscle weakness (generalized): Secondary | ICD-10-CM | POA: Diagnosis present

## 2018-03-03 DIAGNOSIS — R293 Abnormal posture: Secondary | ICD-10-CM | POA: Diagnosis present

## 2018-03-03 DIAGNOSIS — M542 Cervicalgia: Secondary | ICD-10-CM | POA: Diagnosis not present

## 2018-03-05 ENCOUNTER — Ambulatory Visit: Payer: Medicare Other | Admitting: Physical Therapy

## 2018-03-07 NOTE — Therapy (Signed)
[facility] [address] [place], [place], [address] Phone: [phone]   Fax:  [phone]  Physical Therapy Treatment  Patient Details  Name: [name] MRN: [id number] Date of Birth: 01/13/1951 Referring Provider (PT): Dr. Izora Ribas   Encounter Date: 03/03/2018    Treatment: 2 of 8.  Recert date: 07/06/8414 1246 to 71   Past Medical History:  Diagnosis Date  . Anemia   . Anxiety   . Aortic valve stenosis   . Arthritis    feet, legs  . B12 deficiency   . Bowel obstruction ([place])   . CKD (chronic kidney disease)    protein in urine  . Colostomy in place [facility])   . Diabetes mellitus without complication ([place])   . Diverticulitis large intestine   . Dysrhythmia   . Ectopic atrial tachycardia ([place])   . FSGS (focal segmental glomerulosclerosis)   . Gastritis   . GI bleed   . Heart murmur   . Hyperlipidemia   . Hypertension   . Hypothyroidism   . Hypothyroidism    unspecified  . MRSA (methicillin resistant Staphylococcus aureus)    at abdominal wound.  Jan 2017.  Treated.   . Neuropathy   . Neuropathy in diabetes ([place])   . Obesity   . Pedal edema   . Psoriasis   . Shortness of breath dyspnea   . Vertigo     Past Surgical History:  Procedure Laterality Date  . ABDOMINAL HYSTERECTOMY    . APPLICATION OF WOUND VAC N/A 02/24/2015   Procedure: APPLICATION OF WOUND VAC;  Surgeon: Clayburn Pert, MD;  Location: [facility] ORS;  Service: General;  Laterality: N/A;  . APPLICATION OF WOUND VAC N/A 02/26/2015   Procedure: APPLICATION OF WOUND VAC;  Surgeon: Clayburn Pert, MD;  Location: [facility] ORS;  Service: General;  Laterality: N/A;  . BACK SURGERY     spur frmoved from lower back  . CARDIAC CATHETERIZATION    . CARDIAC VALVE REPLACEMENT  08/2017   [facility]  . COLECTOMY    . COLONOSCOPY WITH PROPOFOL N/A 09/04/2016   Procedure: COLONOSCOPY WITH PROPOFOL;  Surgeon: Manya Silvas, MD;  Location: [facility] ENDOSCOPY;  Service:  Endoscopy;  Laterality: N/A;  . COLOSTOMY REVERSAL N/A 07/12/2015   Procedure: COLOSTOMY REVERSAL;  Surgeon: Clayburn Pert, MD;  Location: [facility]S;  Service: General;  Laterality: N/A;  . DEBRIDEMENT OF ABDOMINAL WALL ABSCESS N/A 02/22/2015   Procedure: DEBRIDEMENT OF ABDOMINAL WALL ABSCESS;  Surgeon: Clayburn Pert, MD;  Location: [facility];  Service: General;  Laterality: N/A;  . EXCISION MASS ABDOMINAL N/A 02/24/2015   Procedure: EXCISION MASS ABDOMINAL  / [place];  Surgeon: Clayburn Pert, MD;  Location: [facility];  Service: General;  Laterality: N/A;  . EXCISION MASS ABDOMINAL N/A 02/26/2015   Procedure: EXCISION MASS ABDOMINAL/wash out;  Surgeon: Clayburn Pert, MD;  Location: [facility];  Service: General;  Laterality: N/A;  . FLEXIBLE SIGMOIDOSCOPY  06/19/2015   Procedure: FLEXIBLE SIGMOIDOSCOPY;  Surgeon: Lucilla Lame, MD;  Location: [place];  Service: Endoscopy;;  UNABLE TO ACCESS OSTOMY SITE FOR ACCESS INTO COLON  . LAPAROSCOPY  07/12/2015   Procedure: LAPAROSCOPY DIAGNOSTIC;  Surgeon: Clayburn Pert, MD;  Location: [facility];  Service: General;;  . LAPAROTOMY  07/12/2015   Procedure: EXPLORATORY LAPAROTOMY;  Surgeon: Clayburn Pert, MD;  Location: [facility];  Service: General;;  . LAPAROTOMY N/A 02/06/2015   Procedure: Laparotomy, reduction of incarcerated parastomal hernia, repair of parastomal hernia with mesh;  Surgeon: Sherri Rad, MD;  Location: [facility];  Service: General;  Laterality: N/A;  . laparotomy closure of cecal perforation  05/09/2013   Dr. Marina Gravel  . LYSIS OF ADHESION  07/12/2015   Procedure: LYSIS OF ADHESION;  Surgeon: Clayburn Pert, MD;  Location: [facility];  Service: General;;  . TONSILLECTOMY    . WOUND DEBRIDEMENT N/A 03/03/2015   Procedure: DEBRIDEMENT ABDOMINAL WOUND;  Surgeon: Florene Glen, MD;  Location: [facility];  Service: General;  Laterality: N/A;  . WOUND DEBRIDEMENT N/A 03/09/2015   Procedure: DEBRIDEMENT ABDOMINAL WOUND;  Surgeon: Florene Glen, MD;  Location: [facility];  Service: General;  Laterality: N/A;    There were no vitals filed for this visit.      Pt. reports no pain prior to tx. session. Pt. apologized for missing PT last week but had to take care of mother (bed ridden).        There.ex.:  Reviewed HEP Seated/ supine B shoulder flexion/ abduction (10x)- end-range Seated DNF (cuing provided)- improved technique after first several reps.  See new HEP  Manual tx.:   Supine cervical stretches: UE/levator/ rotation (as tolerated) Supine R/L UE upper limb tension testing: radial/ ulnar/ median nerve glides  Seated STM to B UE/cervical musculature   PT Long Term Goals - 02/17/18 1456      PT LONG TERM GOAL #1   Title  Pt. independent with HEP to increase B UE strength 1/2 muscle grade to improve household chores/ reaching/ lifting.      Baseline  B shoulder strength grossly 4+/5 MMT. Grip strength: L (34#), R (31#).    Time  4    Period  Weeks    Status  New    Target Date  03/17/18      PT LONG TERM GOAL #2   Title  Pt. will increase FOTO from 44 to 52 to improve pain-free mobility.      Baseline  FOTO baseline 44 on 02/17/18    Time  4    Period  Weeks    Status  New    Target Date  03/17/18      PT LONG TERM GOAL #3   Title  Pt. will demonstate improved upright posture/ head position with reading and coloring with no increase c/o pain.      Baseline  Significant thoracic kyphosis/ forward head posture.      Time  4    Period  Weeks    Status  New    Target Date  03/17/18        Pt. needed signifcant cueing for DNF exercises but after 2 sets, she was able to correctly activate her DNF. Pt. also needed scapular cueing when reviewing her HEP ROM exercises but was able to correct with verbal and tactile cues. Pt. found relief when PT performed manual therapy with addition of median, radial and ulnar nerve glides. PT added active nerve glides to HEP and will reassess at next visit. Pt. will  continue to benefit from skilled PT services in order to improve abnormal posture and pain-free ROM with ADLs.        Patient will benefit from skilled therapeutic intervention in order to improve the following deficits and impairments:  Pain, Improper body mechanics, Postural dysfunction, Decreased mobility, Decreased activity tolerance, Decreased endurance, Decreased range of motion, Decreased strength, Hypomobility, Impaired flexibility, Impaired UE functional use, Decreased balance  Visit Diagnosis: Neck pain  Joint stiffness  Abnormal posture  Muscle  weakness (generalized)     Problem List Patient Active Problem List   Diagnosis Date Noted  . DM type 2 with diabetic peripheral neuropathy ([place]) 11/25/2017  . 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 Acute kidney injury superimposed on chronic kidney disease ([place]) 09/01/2017  . CHF (congestive heart failure), NYHA class II, chronic, diastolic ([place]) 97/03/6376  . Paroxysmal A-fib ([place]) 08/14/2017  . Cervical myelopathy ([place]) 08/07/2017  . Onychomycosis of multiple toenails with type 2 diabetes mellitus and peripheral neuropathy ([place]) 06/23/2017  . Myofascial pain 05/27/2017  . Chronic pain of left upper extremity 05/27/2017  . Morbid obesity ([place]) 05/06/2017  . Uncontrolled type 2 diabetes mellitus with hyperglycemia, with long-term current use of insulin ([place]) 02/07/2017  . Hyperlipidemia, mixed 08/08/2016  . Hx of adenomatous colonic polyps 06/14/2016  . 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 Chronic pain syndrome 03/11/2016  . Obesity, Class II, BMI 35-39.9 03/11/2016  . Osteopenia of multiple sites 01/26/2016  . Heart burn 12/18/2015  . Polyneuropathy 11/16/2015  . Lumbar spondylosis 09/25/2015  . Lumbar facet syndrome (Bilateral) (L>R) 09/25/2015  . Recurrent major depressive disorder, in full remission ([place]) 09/19/2015  . Diabetic neuropathy ([place]) 09/06/2015  . H/O  colostomy 07/12/2015  . Chronic abdominal pain (Location of Primary Source of Pain) (Bilateral) (L>R) 07/03/2015  . Long term current use of opiate analgesic 07/03/2015  . Long term prescription opiate use 07/03/2015  . Opiate use (22.5 MME/Day) 07/03/2015  . Encounter for therapeutic drug level monitoring 07/03/2015  . Encounter for pain management planning 07/03/2015  . Neurogenic pain 07/03/2015  . Musculoskeletal pain 07/03/2015  . Visceral abdominal pain 07/03/2015  . Diabetic peripheral neuropathy ([place]) 07/03/2015  . Chronic low back pain (Location of Secondary source of pain) (Bilateral) (L>R) 07/03/2015  . Chronic lower extremity pain (Location of Tertiary source of pain) (Bilateral) (R>L) 07/03/2015  . Chronic hip pain (Bilateral) (L>R) 07/03/2015  . Chronic knee pain (Bilateral) (L>R) 07/03/2015  . Chronic neck pain (Bilateral) (L>R) 07/03/2015  . Anemia 02/16/2015  . Arthritis 02/16/2015  . CKD (chronic kidney disease) stage 4, GFR 15-29 ml/min (HCC) 02/16/2015  . Focal and segmental hyalinosis 02/16/2015  . Bleeding gastrointestinal 02/16/2015  . Acquired atrophy of thyroid 02/16/2015  . Psoriasis 02/16/2015  . Small bowel obstruction ([place]) 02/04/2015  . Severe aortic stenosis 10/11/2014  . Paroxysmal supraventricular tachycardia ([place]) 10/11/2014  . SOBOE (shortness of breath on exertion) 10/11/2014  . Benign essential HTN 10/04/2014  . Obesity 09/05/2014  . B12 deficiency 03/02/2014  . Diabetes mellitus type 2 in obese ([place]) 12/01/2013  . Abnormal presence of protein in urine 10/15/2010   Pura Spice, PT, DPT # [phone] 03/07/2018, 9:57 AM  [facility] [address] [place], [place], [address] Phone: [phone]   Fax:  [phone]  Name: [name] MRN: [id number] Date of Birth: [birth year]

## 2018-03-09 ENCOUNTER — Ambulatory Visit: Payer: Medicare Other | Admitting: Physical Therapy

## 2018-03-12 ENCOUNTER — Ambulatory Visit: Payer: Medicare Other | Admitting: Physical Therapy

## 2018-03-17 ENCOUNTER — Ambulatory Visit: Payer: Medicare Other | Admitting: Physical Therapy

## 2018-03-17 ENCOUNTER — Encounter: Payer: Self-pay | Admitting: Physical Therapy

## 2018-03-17 DIAGNOSIS — M6281 Muscle weakness (generalized): Secondary | ICD-10-CM

## 2018-03-17 DIAGNOSIS — R293 Abnormal posture: Secondary | ICD-10-CM

## 2018-03-17 DIAGNOSIS — M2560 Stiffness of unspecified joint, not elsewhere classified: Secondary | ICD-10-CM

## 2018-03-17 DIAGNOSIS — M542 Cervicalgia: Secondary | ICD-10-CM | POA: Diagnosis not present

## 2018-03-17 NOTE — Therapy (Signed)
[facility] [address] [place], [place], [address] Phone: [phone]   Fax:  [phone]  Physical Therapy Treatment  Patient Details  Name: [name] MRN: [id number] Date of Birth: 1950-12-22 Referring Provider (PT): Dr. Izora Ribas   Encounter Date: 03/17/2018  PT End of Session - 03/17/18 1403    Visit Number  3    Number of Visits  8    Date for PT Re-Evaluation  03/17/18    PT Start Time  1251    PT Stop Time  1347    PT Time Calculation (min)  56 min    Activity Tolerance  Patient tolerated treatment well    Behavior During Therapy  [facility] for tasks assessed/performed       Past Medical History:  Diagnosis Date  . Anemia   . Anxiety   . Aortic valve stenosis   . Arthritis    feet, legs  . B12 deficiency   . Bowel obstruction ([place])   . CKD (chronic kidney disease)    protein in urine  . Colostomy in place [facility])   . Diabetes mellitus without complication ([place])   . Diverticulitis large intestine   . Dysrhythmia   . Ectopic atrial tachycardia ([place])   . FSGS (focal segmental glomerulosclerosis)   . Gastritis   . GI bleed   . Heart murmur   . Hyperlipidemia   . Hypertension   . Hypothyroidism   . Hypothyroidism    unspecified  . MRSA (methicillin resistant Staphylococcus aureus)    at abdominal wound.  Jan 2017.  Treated.   . Neuropathy   . Neuropathy in diabetes ([place])   . Obesity   . Pedal edema   . Psoriasis   . Shortness of breath dyspnea   . Vertigo     Past Surgical History:  Procedure Laterality Date  . ABDOMINAL HYSTERECTOMY    . APPLICATION OF WOUND VAC N/A 02/24/2015   Procedure: APPLICATION OF WOUND VAC;  Surgeon: Clayburn Pert, MD;  Location: [facility] ORS;  Service: General;  Laterality: N/A;  . APPLICATION OF WOUND VAC N/A 02/26/2015   Procedure: APPLICATION OF WOUND VAC;  Surgeon: Clayburn Pert, MD;  Location: [facility] ORS;  Service: General;  Laterality: N/A;  . BACK SURGERY     spur frmoved  from lower back  . CARDIAC CATHETERIZATION    . CARDIAC VALVE REPLACEMENT  08/2017   [facility]  . COLECTOMY    . COLONOSCOPY WITH PROPOFOL N/A 09/04/2016   Procedure: COLONOSCOPY WITH PROPOFOL;  Surgeon: Manya Silvas, MD;  Location: [facility] ENDOSCOPY;  Service: Endoscopy;  Laterality: N/A;  . COLOSTOMY REVERSAL N/A 07/12/2015   Procedure: COLOSTOMY REVERSAL;  Surgeon: Clayburn Pert, MD;  Location: [facility] ORS;  Service: General;  Laterality: N/A;  . DEBRIDEMENT OF ABDOMINAL WALL ABSCESS N/A 02/22/2015   Procedure: DEBRIDEMENT OF ABDOMINAL WALL ABSCESS;  Surgeon: Clayburn Pert, MD;  Location: [facility] ORS;  Service: General;  Laterality: N/A;  . EXCISION MASS ABDOMINAL N/A 02/24/2015   Procedure: EXCISION MASS ABDOMINAL  / [place];  Surgeon: Clayburn Pert, MD;  Location: [facility] ORS;  Service: General;  Laterality: N/A;  . EXCISION MASS ABDOMINAL N/A 02/26/2015   Procedure: EXCISION MASS ABDOMINAL/wash out;  Surgeon: Clayburn Pert, MD;  Location: [facility] ORS;  Service: General;  Laterality: N/A;  . FLEXIBLE SIGMOIDOSCOPY  06/19/2015   Procedure: FLEXIBLE SIGMOIDOSCOPY;  Surgeon: Lucilla Lame, MD;  Location: [place];  Service: Endoscopy;;  UNABLE TO ACCESS OSTOMY  SITE FOR ACCESS INTO COLON  . LAPAROSCOPY  07/12/2015   Procedure: LAPAROSCOPY DIAGNOSTIC;  Surgeon: Clayburn Pert, MD;  Location: [facility] ORS;  Service: General;;  . LAPAROTOMY  07/12/2015   Procedure: EXPLORATORY LAPAROTOMY;  Surgeon: Clayburn Pert, MD;  Location: [facility] ORS;  Service: General;;  . LAPAROTOMY N/A 02/06/2015   Procedure: Laparotomy, reduction of incarcerated parastomal hernia, repair of parastomal hernia with mesh;  Surgeon: Sherri Rad, MD;  Location: [facility] ORS;  Service: General;  Laterality: N/A;  . laparotomy closure of cecal perforation  05/09/2013   Dr. Marina Gravel  . LYSIS OF ADHESION  07/12/2015   Procedure: LYSIS OF ADHESION;  Surgeon: Clayburn Pert, MD;  Location: [facility] ORS;  Service: General;;  . TONSILLECTOMY    . WOUND  DEBRIDEMENT N/A 03/03/2015   Procedure: DEBRIDEMENT ABDOMINAL WOUND;  Surgeon: Florene Glen, MD;  Location: [facility] ORS;  Service: General;  Laterality: N/A;  . WOUND DEBRIDEMENT N/A 03/09/2015   Procedure: DEBRIDEMENT ABDOMINAL WOUND;  Surgeon: Florene Glen, MD;  Location: [facility] ORS;  Service: General;  Laterality: N/A;    There were no vitals filed for this visit.  Subjective Assessment - 03/17/18 1358    Subjective  Pt. has missed the last few appointments due to caring for her mother. Pt. reports no pain today but has had stiffness in her neck throughout the day. The worst her pain gets up to is 7/10 NPS which she manages with pain medicine. Pt. states her numbness is the same and has been able to get to her HEP 2x/day.     Pertinent History  S/p ACDF 10+ years ago with improvement noted.  R handed.  Pt. enjoys coloring book/ read.     Limitations  Reading;Lifting;Standing;House hold activities    Diagnostic tests  see MRI    Patient Stated Goals  Increase UE strength/ posture/ pain-free mobility.      Currently in Pain?  No/denies    Pain Score  0-No pain    Pain Onset  More than a month ago        Treatment:  There.ex.:  Reassessment of shld and neck AROM Reviewed HEP Seated scapular retractions RTB 10x Seated B ER RTB (15x; fatigue noted) UBE 2 min fwd/bkwd (fatigue noted after 3 minutes; PT educated pt on posture throughout)  Manual tx.:   Supine cervical PROM (all directions) Supine cervical stretches: UE/levator/ rotation (as tolerated) Supine R/L UE upper limb tension glides: radial/ ulnar/ median nerve Seated STM to B UE/cervical musculature    PT Education - 03/17/18 1402    Education provided  Yes    Education Details  new HEP (see handouts), postural education for reading, during exercises and throughout ADLs.    Person(s) Educated  Patient    Methods  Explanation;Demonstration    Comprehension  Verbalized understanding;Returned demonstration           PT Long Term Goals - 02/17/18 1456      PT LONG TERM GOAL #1   Title  Pt. independent with HEP to increase B UE strength 1/2 muscle grade to improve household chores/ reaching/ lifting.      Baseline  B shoulder strength grossly 4+/5 MMT. Grip strength: L (34#), R (31#).    Time  4    Period  Weeks    Status  New    Target Date  03/17/18      PT LONG TERM GOAL #2   Title  Pt. will increase FOTO from 44 to 52  to improve pain-free mobility.      Baseline  FOTO baseline 44 on 02/17/18    Time  4    Period  Weeks    Status  New    Target Date  03/17/18      PT LONG TERM GOAL #3   Title  Pt. will demonstate improved upright posture/ head position with reading and coloring with no increase c/o pain.      Baseline  Significant thoracic kyphosis/ forward head posture.      Time  4    Period  Weeks    Status  New    Target Date  03/17/18         Plan - 03/17/18 1411    Clinical Impression Statement  Pt. presents to therapy on SPC and increased kyphosis and forward head posture. Pt. was slightly guarded during cervical PROM and is still signficantly limited in lateral bending/ rotation (L > R). Pt. reported that manual therapy and nerve glides "feels like a good stretch." Pt. was fatigued after 15 reps of ER scapular retractions and 3 minutes on UBE. Pt. needs significant cueing throughout all therex to position her neck in neutral position (pt. tends to hang neck in flexed position) and retract scapulas. Pt. will continue to benefit from skilled PT services in order to increase neck ROM, decrease neural sx and improve overall strength/ endurance for ADLs and hobbies such as reading.     Clinical Presentation  Stable    Rehab Potential  Fair    PT Frequency  2x / week    PT Duration  2 weeks    PT Treatment/Interventions  ADLs/Self Care Home Management;Aquatic Therapy;Cryotherapy;Electrical Stimulation;Moist Heat;Functional mobility training;Therapeutic activities;Therapeutic  exercise;Patient/family education;Neuromuscular re-education;Manual techniques;Dry needling;Passive range of motion    PT Next Visit Plan  RECERT, CHECK GOALS, check on PT plan/ frequency, standing B UE and neck strengthening as tolerated    PT Home Exercise Plan  see handouts.       Patient will benefit from skilled therapeutic intervention in order to improve the following deficits and impairments:  Pain, Improper body mechanics, Postural dysfunction, Decreased mobility, Decreased activity tolerance, Decreased endurance, Decreased range of motion, Decreased strength, Hypomobility, Impaired flexibility, Impaired UE functional use, Decreased balance  Visit Diagnosis: Neck pain  Joint stiffness  Abnormal posture  Muscle weakness (generalized)  Neck pain on left side     Problem List Patient Active Problem List   Diagnosis Date Noted  . DM type 2 with diabetic peripheral neuropathy ([place]) 11/25/2017  . 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 Acute kidney injury superimposed on chronic kidney disease ([place]) 09/01/2017  . CHF (congestive heart failure), NYHA class II, chronic, diastolic ([place]) 33/82/5053  . Paroxysmal A-fib ([place]) 08/14/2017  . Cervical myelopathy ([place]) 08/07/2017  . Onychomycosis of multiple toenails with type 2 diabetes mellitus and peripheral neuropathy ([place]) 06/23/2017  . Myofascial pain 05/27/2017  . Chronic pain of left upper extremity 05/27/2017  . Morbid obesity ([place]) 05/06/2017  . Uncontrolled type 2 diabetes mellitus with hyperglycemia, with long-term current use of insulin ([place]) 02/07/2017  . Hyperlipidemia, mixed 08/08/2016  . Hx of adenomatous colonic polyps 06/14/2016  . 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 Chronic pain syndrome 03/11/2016  . Obesity, Class II, BMI 35-39.9 03/11/2016  . Osteopenia of multiple sites 01/26/2016  . Heart burn 12/18/2015  . Polyneuropathy 11/16/2015  . Lumbar spondylosis  09/25/2015  . Lumbar facet syndrome (Bilateral) (L>R) 09/25/2015  .  Recurrent major depressive disorder, in full remission ([place]) 09/19/2015  . Diabetic neuropathy ([place]) 09/06/2015  . H/O colostomy 07/12/2015  . Chronic abdominal pain (Location of Primary Source of Pain) (Bilateral) (L>R) 07/03/2015  . Long term current use of opiate analgesic 07/03/2015  . Long term prescription opiate use 07/03/2015  . Opiate use (22.5 MME/Day) 07/03/2015  . Encounter for therapeutic drug level monitoring 07/03/2015  . Encounter for pain management planning 07/03/2015  . Neurogenic pain 07/03/2015  . Musculoskeletal pain 07/03/2015  . Visceral abdominal pain 07/03/2015  . Diabetic peripheral neuropathy ([place]) 07/03/2015  . Chronic low back pain (Location of Secondary source of pain) (Bilateral) (L>R) 07/03/2015  . Chronic lower extremity pain (Location of Tertiary source of pain) (Bilateral) (R>L) 07/03/2015  . Chronic hip pain (Bilateral) (L>R) 07/03/2015  . Chronic knee pain (Bilateral) (L>R) 07/03/2015  . Chronic neck pain (Bilateral) (L>R) 07/03/2015  . Anemia 02/16/2015  . Arthritis 02/16/2015  . CKD (chronic kidney disease) stage 4, GFR 15-29 ml/min ([facility]) 02/16/2015  . Focal and segmental hyalinosis 02/16/2015  . Bleeding gastrointestinal 02/16/2015  . Acquired atrophy of thyroid 02/16/2015  . Psoriasis 02/16/2015  . Small bowel obstruction ([place]) 02/04/2015  . Severe aortic stenosis 10/11/2014  . Paroxysmal supraventricular tachycardia ([place]) 10/11/2014  . SOBOE (shortness of breath on exertion) 10/11/2014  . Benign essential HTN 10/04/2014  . Obesity 09/05/2014  . B12 deficiency 03/02/2014  . Diabetes mellitus type 2 in obese ([place]) 12/01/2013  . Abnormal presence of protein in urine 10/15/2010   Pura Spice, PT, DPT # [id number] [place], [place] 03/18/2018, 4:33 PM  [facility] [address] [facility] [address] [place], [place], [address] Phone:  [phone]   Fax:  [phone]  Name: [name] MRN: [id number] Date of Birth: 1950/07/04

## 2018-03-17 NOTE — Patient Instructions (Signed)
Access Code: [email or web address]  URL: [email or web address]  Date: 03/17/2018  Prepared by: Dorcas Carrow   Exercises  Doorway Pec Stretch at 60 Elevation - 3 reps - 1 sets - 20 hold - 2x daily - 7x weekly  Shoulder External Rotation and Scapular Retraction with Resistance - 15 reps - 2 sets - 1x daily - 7x weekly

## 2018-03-19 ENCOUNTER — Encounter: Payer: Self-pay | Admitting: Physical Therapy

## 2018-03-19 ENCOUNTER — Ambulatory Visit: Payer: Medicare Other | Admitting: Physical Therapy

## 2018-03-19 DIAGNOSIS — M542 Cervicalgia: Secondary | ICD-10-CM | POA: Diagnosis not present

## 2018-03-19 DIAGNOSIS — R293 Abnormal posture: Secondary | ICD-10-CM

## 2018-03-19 DIAGNOSIS — M6281 Muscle weakness (generalized): Secondary | ICD-10-CM

## 2018-03-19 DIAGNOSIS — M2560 Stiffness of unspecified joint, not elsewhere classified: Secondary | ICD-10-CM

## 2018-03-19 NOTE — Therapy (Signed)
[facility] [address]. [place], [place], [address] Phone: [phone]   Fax:  [phone]  Physical Therapy Treatment/ Discharge  Patient Details  Name: [name] MRN: [id number] Date of Birth: [birth year] Referring Provider (PT): Dr. Izora Ribas   Encounter Date: 03/19/2018  PT End of Session - 03/19/18 1338    Visit Number  4    Number of Visits  8    Date for PT Re-Evaluation  03/19/18    PT Start Time  1248    PT Stop Time  1339    PT Time Calculation (min)  51 min    Activity Tolerance  Patient tolerated treatment well;Patient limited by fatigue    Behavior During Therapy  [facility] for tasks assessed/performed       Past Medical History:  Diagnosis Date  . Anemia   . Anxiety   . Aortic valve stenosis   . Arthritis    feet, legs  . B12 deficiency   . Bowel obstruction ([place])   . CKD (chronic kidney disease)    protein in urine  . Colostomy in place [facility])   . Diabetes mellitus without complication ([place])   . Diverticulitis large intestine   . Dysrhythmia   . Ectopic atrial tachycardia ([place])   . FSGS (focal segmental glomerulosclerosis)   . Gastritis   . GI bleed   . Heart murmur   . Hyperlipidemia   . Hypertension   . Hypothyroidism   . Hypothyroidism    unspecified  . MRSA (methicillin resistant Staphylococcus aureus)    at abdominal wound.  Jan 2017.  Treated.   . Neuropathy   . Neuropathy in diabetes ([place])   . Obesity   . Pedal edema   . Psoriasis   . Shortness of breath dyspnea   . Vertigo     Past Surgical History:  Procedure Laterality Date  . ABDOMINAL HYSTERECTOMY    . APPLICATION OF WOUND VAC N/A 02/24/2015   Procedure: APPLICATION OF WOUND VAC;  Surgeon: Clayburn Pert, MD;  Location: [facility] ORS;  Service: General;  Laterality: N/A;  . APPLICATION OF WOUND VAC N/A 02/26/2015   Procedure: APPLICATION OF WOUND VAC;  Surgeon: Clayburn Pert, MD;  Location: [facility];  Service: General;  Laterality:  N/A;  . BACK SURGERY     spur frmoved from lower back  . CARDIAC CATHETERIZATION    . CARDIAC VALVE REPLACEMENT  08/2017   [facility]  . COLECTOMY    . COLONOSCOPY WITH PROPOFOL N/A 09/04/2016   Procedure: COLONOSCOPY WITH PROPOFOL;  Surgeon: Manya Silvas, MD;  Location: [facility] ENDOSCOPY;  Service: Endoscopy;  Laterality: N/A;  . COLOSTOMY REVERSAL N/A 07/12/2015   Procedure: COLOSTOMY REVERSAL;  Surgeon: Clayburn Pert, MD;  Location: [facility] ORS;  Service: General;  Laterality: N/A;  . DEBRIDEMENT OF ABDOMINAL WALL ABSCESS N/A 02/22/2015   Procedure: DEBRIDEMENT OF ABDOMINAL WALL ABSCESS;  Surgeon: Clayburn Pert, MD;  Location: [facility];  Service: General;  Laterality: N/A;  . EXCISION MASS ABDOMINAL N/A 02/24/2015   Procedure: EXCISION MASS ABDOMINAL  / [place];  Surgeon: Clayburn Pert, MD;  Location: [facility] OR[facility];  Service: General;  Laterality: N/A;  . EXCISION MASS ABDOMINAL N/A 02/26/2015   Procedure: EXCISION MASS ABDOMINAL/wash out;  Surgeon: Clayburn Pert, MD;  Location: [facility];  Service: General;  Laterality: N/A;  . FLEXIBLE SIGMOIDOSCOPY  06/19/2015   Procedure: FLEXIBLE SIGMOIDOSCOPY;  Surgeon: Lucilla Lame, MD;  Location: [facility] [place];  Service: Endoscopy;;  UNABLE TO ACCESS OSTOMY SITE FOR ACCESS INTO COLON  . LAPAROSCOPY  07/12/2015   Procedure: LAPAROSCOPY DIAGNOSTIC;  Surgeon: Clayburn Pert, MD;  Location: [facility];  Service: General;;  . LAPAROTOMY  07/12/2015   Procedure: EXPLORATORY LAPAROTOMY;  Surgeon: Clayburn Pert, MD;  Location: [facility];  Service: General;;  . LAPAROTOMY N/A 02/06/2015   Procedure: Laparotomy, reduction of incarcerated parastomal hernia, repair of parastomal hernia with mesh;  Surgeon: Sherri Rad, MD;  Location: [facility];  Service: General;  Laterality: N/A;  . laparotomy closure of cecal perforation  05/09/2013   Dr. Marina Gravel  . LYSIS OF ADHESION  07/12/2015   Procedure: LYSIS OF ADHESION;  Surgeon: Clayburn Pert, MD;  Location: [facility];  Service:  General;;  . TONSILLECTOMY    . WOUND DEBRIDEMENT N/A 03/03/2015   Procedure: DEBRIDEMENT ABDOMINAL WOUND;  Surgeon: Florene Glen, MD;  Location: [facility];  Service: General;  Laterality: N/A;  . WOUND DEBRIDEMENT N/A 03/09/2015   Procedure: DEBRIDEMENT ABDOMINAL WOUND;  Surgeon: Florene Glen, MD;  Location: [facility];  Service: General;  Laterality: N/A;    There were no vitals filed for this visit.  Subjective Assessment - 03/19/18 1519    Subjective  Pt. reports muscle soreness in B UE after last session but no pain today. Pt. reports her neck is doing better but being up on her feet while cleaning increases her sx. Pt. states she is able to read/ color for 30 minutes before feeling like she needs to move her neck. Pt. reports she is able to cook more but is still not driving. Pt. states that she feels like she is sleeping better and a muscle relaxer is helpful.      Pertinent History  S/p ACDF 10+ years ago with improvement noted.  R handed.  Pt. enjoys coloring book/ read.     Limitations  Reading;Lifting;Standing;House hold activities    Diagnostic tests  see MRI    Patient Stated Goals  Increase UE strength/ posture/ pain-free mobility.      Currently in Pain?  No/denies    Pain Score  0-No pain    Pain Location  Neck    Pain Onset  More than a month ago       Treatment:  There.ex.:  Reassessment of neck AROM (no significant changes in AROM from eval) and B UE MMT (grossly 5/5 except for abduction 4/5) Reviewed new HEP to include: AAROM chest press, shld flexion, RTB shld abduction and diagonals   Manual tx.:   Supine cervical distraction 3x30 sec Supine cervical PROM (all directions) Supine cervical stretches: UE/levator/ rotation (as tolerated)  See new HEP handouts   PT Education - 03/19/18 1337    Education provided  Yes    Education Details  Pt. instructed on new HEP and the continuation of previous HEP, importance of posture and active exercising of UE  and neck    Person(s) Educated  Patient    Methods  Explanation;Demonstration    Comprehension  Verbalized understanding;Returned demonstration;Verbal cues required        PT Long Term Goals - 03/19/18 1522      PT LONG TERM GOAL #1   Title  Pt. independent with HEP to increase B UE strength 1/2 muscle grade to improve household chores/ reaching/ lifting.      Baseline  B shoulder strength grossly 5/5 except for abduction (4/5). No change in grip strength    Time  4    Period  Weeks  Status  Partially Met    Target Date  03/19/18      PT LONG TERM GOAL #2   Title  Pt. will increase FOTO from 44 to 52 to improve pain-free mobility.      Baseline  1/30 FOTO: 54    Time  4    Period  Weeks    Status  Achieved    Target Date  03/19/18      PT LONG TERM GOAL #3   Title  Pt. will demonstate improved upright posture/ head position with reading and coloring with no increase c/o pain.      Baseline  Pt still demonstrates significant thoracic kyphosis/ forward head posture but able to read/ color for 30 minutes with no increase in pain.    Time  4    Period  Weeks    Status  Partially Met    Target Date  03/19/18      PT LONG TERM GOAL #4   Title  Pt. will report <3/10 L sided neck pain at worst with daily tasks/ caring for mother.       Baseline  Pt. reports 0/10 NPS at rest    Time  4    Period  Weeks    Status  Partially Met    Target Date  03/19/18         Plan - 03/19/18 1532    Clinical Impression Statement  Pt. presents to therapy with no pain and overall reports that her neck has been feeling better. Pt. was in agreement with d/c today due to her busy scheduling with caring for her family members and is ready to transition to independent HEP and will call PT if needed. Pt. had no significant improvement with cervical AROM but overall UE muscle strength has increased to grossly 5/5 except for abudction (4/5). Pt. still presents with slight pain in neck during AROM. Pt.  reports she is able to cook more but still has not returned to driving. Pt. was instructed with new HEP to include more UE strengthening (see handouts). Pt. needs min verbal cueing to maintain upright posture throughout therex. Pt. still demonstrates overall diminished endurance with therex. PT educated pt. on the importance of AROM, self nerve glides and continuning exercise to maintain UE and neck strength to promote pain free ADLs and functional mobility.    Clinical Presentation  Stable    Clinical Decision Making  Low    Rehab Potential  Fair    PT Frequency  2x / week    PT Duration  2 weeks    PT Treatment/Interventions  ADLs/Self Care Home Management;Aquatic Therapy;Cryotherapy;Electrical Stimulation;Moist Heat;Functional mobility training;Therapeutic activities;Therapeutic exercise;Patient/family education;Neuromuscular re-education;Manual techniques;Dry needling;Passive range of motion    PT Next Visit Plan  Discharge    PT Home Exercise Plan  see handouts.       Patient will benefit from skilled therapeutic intervention in order to improve the following deficits and impairments:  Pain, Improper body mechanics, Postural dysfunction, Decreased mobility, Decreased activity tolerance, Decreased endurance, Decreased range of motion, Decreased strength, Hypomobility, Impaired flexibility, Impaired UE functional use, Decreased balance  Visit Diagnosis: Neck pain  Joint stiffness  Abnormal posture  Muscle weakness (generalized)  Neck pain on left side     Problem List Patient Active Problem List   Diagnosis Date Noted  . DM type 2 with diabetic peripheral neuropathy ([place]) 11/25/2017  . 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 Acute kidney injury superimposed on  chronic kidney disease ([place]) 09/01/2017  . CHF (congestive heart failure), NYHA class II, chronic, diastolic ([place]) 25/00/3704  . Paroxysmal A-fib ([place]) 08/14/2017  . Cervical myelopathy ([place])  08/07/2017  . Onychomycosis of multiple toenails with type 2 diabetes mellitus and peripheral neuropathy ([employer] [facility]) 06/23/2017  . Myofascial pain 05/27/2017  . Chronic pain of left upper extremity 05/27/2017  . Morbid obesity ([place]) 05/06/2017  . Uncontrolled type 2 diabetes mellitus with hyperglycemia, with long-term current use of insulin ([place]) 02/07/2017  . Hyperlipidemia, mixed 08/08/2016  . Hx of adenomatous colonic polyps 06/14/2016  . 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 Chronic pain syndrome 03/11/2016  . Obesity, Class II, BMI 35-39.9 03/11/2016  . Osteopenia of multiple sites 01/26/2016  . Heart burn 12/18/2015  . Polyneuropathy 11/16/2015  . Lumbar spondylosis 09/25/2015  . Lumbar facet syndrome (Bilateral) (L>R) 09/25/2015  . Recurrent major depressive disorder, in full remission ([place]) 09/19/2015  . Diabetic neuropathy ([place]) 09/06/2015  . H/O colostomy 07/12/2015  . Chronic abdominal pain (Location of Primary Source of Pain) (Bilateral) (L>R) 07/03/2015  . Long term current use of opiate analgesic 07/03/2015  . Long term prescription opiate use 07/03/2015  . Opiate use (22.5 MME/Day) 07/03/2015  . Encounter for therapeutic drug level monitoring 07/03/2015  . Encounter for pain management planning 07/03/2015  . Neurogenic pain 07/03/2015  . Musculoskeletal pain 07/03/2015  . Visceral abdominal pain 07/03/2015  . Diabetic peripheral neuropathy ([place]) 07/03/2015  . Chronic low back pain (Location of Secondary source of pain) (Bilateral) (L>R) 07/03/2015  . Chronic lower extremity pain (Location of Tertiary source of pain) (Bilateral) (R>L) 07/03/2015  . Chronic hip pain (Bilateral) (L>R) 07/03/2015  . Chronic knee pain (Bilateral) (L>R) 07/03/2015  . Chronic neck pain (Bilateral) (L>R) 07/03/2015  . Anemia 02/16/2015  . Arthritis 02/16/2015  . CKD (chronic kidney disease) stage 4, GFR 15-29 ml/min ([facility]) 02/16/2015  . Focal and segmental hyalinosis 02/16/2015   . Bleeding gastrointestinal 02/16/2015  . Acquired atrophy of thyroid 02/16/2015  . Psoriasis 02/16/2015  . Small bowel obstruction ([place]) 02/04/2015  . Severe aortic stenosis 10/11/2014  . Paroxysmal supraventricular tachycardia ([place]) 10/11/2014  . SOBOE (shortness of breath on exertion) 10/11/2014  . Benign essential HTN 10/04/2014  . Obesity 09/05/2014  . B12 deficiency 03/02/2014  . Diabetes mellitus type 2 in obese ([place]) 12/01/2013  . Abnormal presence of protein in urine 10/15/2010    Pura Spice, PT, DPT # [id number] [place], [place] 03/20/2018, 9:40 AM  [facility] [address] [place], [place], [address] Phone: [phone]   Fax:  [phone]  Name: [name] MRN: [id number] Date of Birth: [birth year]

## 2018-03-19 NOTE — Patient Instructions (Signed)
Access Code: [email or web address]  URL: [email or web address]  Date: 03/19/2018  Prepared by: Dorcas Carrow   Exercises  Supine Shoulder Press AAROM in Abduction with [place] - 10 reps - 3 sets - 1x daily - 7x weekly  Supine Shoulder Flexion Extension AAROM with [place] - 10 reps - 3 sets - 1x daily - 7x weekly  Standing Shoulder Horizontal Abduction with Resistance - 10 reps - 3 sets - 1x daily - 7x weekly  Standing Shoulder Diagonal Horizontal Abduction 60/120 Degrees with Resistance - 10 reps - 3 sets - 1x daily - 7x weekly

## 2018-03-20 NOTE — Addendum Note (Signed)
Addended by: Pura Spice on: 03/20/2018 09:47 AM   Modules accepted: Orders

## 2018-03-23 ENCOUNTER — Encounter: Payer: Medicare Other | Admitting: Nurse Practitioner

## 2018-04-09 ENCOUNTER — Encounter: Payer: Self-pay | Admitting: Nurse Practitioner

## 2018-04-09 ENCOUNTER — Other Ambulatory Visit: Payer: Self-pay

## 2018-04-09 ENCOUNTER — Ambulatory Visit: Payer: Medicare Other | Attending: Nurse Practitioner | Admitting: Nurse Practitioner

## 2018-04-09 VITALS — BP 146/88 | HR 60 | Temp 98.0°F | Ht 63.0 in | Wt 230.0 lb

## 2018-04-09 DIAGNOSIS — Z79891 Long term (current) use of opiate analgesic: Secondary | ICD-10-CM | POA: Diagnosis present

## 2018-04-09 DIAGNOSIS — M7918 Myalgia, other site: Secondary | ICD-10-CM

## 2018-04-09 DIAGNOSIS — G894 Chronic pain syndrome: Secondary | ICD-10-CM | POA: Insufficient documentation

## 2018-04-09 DIAGNOSIS — R109 Unspecified abdominal pain: Secondary | ICD-10-CM | POA: Diagnosis not present

## 2018-04-09 DIAGNOSIS — M47816 Spondylosis without myelopathy or radiculopathy, lumbar region: Secondary | ICD-10-CM | POA: Insufficient documentation

## 2018-04-09 MED ORDER — OXYCODONE HCL 5 MG PO TABS: 5.0000 mg | ORAL_TABLET | Freq: Three times a day (TID) | ORAL | 0 refills | Status: DC | PRN

## 2018-04-09 NOTE — Patient Instructions (Signed)
____________________________________________________________________________________________  Medication Rules  Purpose: To inform patients, and their family members, of our rules and regulations.  Applies to: All patients receiving prescriptions (written or electronic).  Pharmacy of record: Pharmacy where electronic prescriptions will be sent. If written prescriptions are taken to a different pharmacy, please inform the nursing staff. The pharmacy listed in the electronic medical record should be the one where you would like electronic prescriptions to be sent.  Electronic prescriptions: In compliance with the [place] Strengthen Opioid Misuse Prevention (STOP) Act of 2017 (Session Law 2017-74/H243), effective February 18, 2018, all controlled substances must be electronically prescribed. Calling prescriptions to the pharmacy will cease to exist.  Prescription refills: Only during scheduled appointments. Applies to all prescriptions.  NOTE: The following applies primarily to controlled substances (Opioid* Pain Medications).   Patient's responsibilities: 1. Pain Pills: Bring all pain pills to every appointment (except for procedure appointments). 2. Pill Bottles: Bring pills in original pharmacy bottle. Always bring the newest bottle. Bring bottle, even if empty. 3. Medication refills: You are responsible for knowing and keeping track of what medications you take and those you need refilled. The day before your appointment: write a list of all prescriptions that need to be refilled. The day of the appointment: give the list to the admitting nurse. Prescriptions will be written only during appointments. No prescriptions will be written on procedure days. If you forget a medication: it will not be "Called in", "Faxed", or "electronically sent". You will need to get another appointment to get these prescribed. No early refills. Do not call asking to have your prescription filled  early. 4. Prescription Accuracy: You are responsible for carefully inspecting your prescriptions before leaving our office. Have the discharge nurse carefully go over each prescription with you, before taking them home. Make sure that your name is accurately spelled, that your address is correct. Check the name and dose of your medication to make sure it is accurate. Check the number of pills, and the written instructions to make sure they are clear and accurate. Make sure that you are given enough medication to last until your next medication refill appointment. 5. Taking Medication: Take medication as prescribed. When it comes to controlled substances, taking less pills or less frequently than prescribed is permitted and encouraged. Never take more pills than instructed. Never take medication more frequently than prescribed.  6. Inform other Doctors: Always inform, all of your healthcare providers, of all the medications you take. 7. Pain Medication from other Providers: You are not allowed to accept any additional pain medication from any other Doctor or Healthcare provider. There are two exceptions to this rule. (see below) In the event that you require additional pain medication, you are responsible for notifying us, as stated below. 8. Medication Agreement: You are responsible for carefully reading and following our Medication Agreement. This must be signed before receiving any prescriptions from our practice. Safely store a copy of your signed Agreement. Violations to the Agreement will result in no further prescriptions. (Additional copies of our Medication Agreement are available upon request.) 9. Laws, Rules, & Regulations: All patients are expected to follow all Federal and State Laws, Statutes, Rules, & Regulations. Ignorance of the Laws does not constitute a valid excuse. The use of any illegal substances is prohibited. 10. Adopted CDC guidelines & recommendations: Target dosing levels will be  at or below 60 MME/day. Use of benzodiazepines** is not recommended.  Exceptions: There are only two exceptions to the rule of not   receiving pain medications from other Healthcare Providers. 1. Exception #1 (Emergencies): In the event of an emergency (i.e.: accident requiring emergency care), you are allowed to receive additional pain medication. However, you are responsible for: As soon as you are able, call our office [phone], at any time of the day or night, and leave a message stating your name, the date and nature of the emergency, and the name and dose of the medication prescribed. In the event that your call is answered by a member of our staff, make sure to document and save the date, time, and the name of the person that took your information.  2. Exception #2 (Planned Surgery): In the event that you are scheduled by another doctor or dentist to have any type of surgery or procedure, you are allowed (for a period no longer than 30 days), to receive additional pain medication, for the acute post-op pain. However, in this case, you are responsible for picking up a copy of our "Post-op Pain Management for Surgeons" handout, and giving it to your surgeon or dentist. This document is available at our office, and does not require an appointment to obtain it. Simply go to our office during business hours (Monday-Thursday from 8:00 AM to 4:00 PM) (Friday 8:00 AM to 12:00 Noon) or if you have a scheduled appointment with us, prior to your surgery, and ask for it by name. In addition, you will need to provide us with your name, name of your surgeon, type of surgery, and date of procedure or surgery.  *Opioid medications include: morphine, codeine, oxycodone, oxymorphone, hydrocodone, hydromorphone, meperidine, tramadol, tapentadol, buprenorphine, fentanyl, methadone. **Benzodiazepine medications include: diazepam (Valium), alprazolam (Xanax), clonazepam (Klonopine), lorazepam (Ativan), clorazepate  (Tranxene), chlordiazepoxide (Librium), estazolam (Prosom), oxazepam (Serax), temazepam (Restoril), triazolam (Halcion) (Last updated: 04/17/2017) ____________________________________________________________________________________________    

## 2018-04-09 NOTE — Progress Notes (Signed)
Nursing Pain Medication Assessment:  Safety precautions to be maintained throughout the outpatient stay will include: orient to surroundings, keep bed in low position, maintain call bell within reach at all times, provide assistance with transfer out of bed and ambulation.  Medication Inspection Compliance: Pill count conducted under aseptic conditions, in front of the patient. Neither the pills nor the bottle was removed from the patient's sight at any time. Once count was completed pills were immediately returned to the patient in their original bottle.  Medication: Oxycodone IR Pill/Patch Count: 0 of 90 pills remain Pill/Patch Appearance: Markings consistent with prescribed medication Bottle Appearance: Standard pharmacy container. Clearly labeled. Filled Date: 1 / 68 / 2020 Last Medication intake:  Ran out of medicine more than 48 hours ago

## 2018-04-09 NOTE — Progress Notes (Signed)
Patient's Name: [name]  MRN: [id number]  Referring Provider: Adin Hector, MD  DOB: 1950/04/20  PCP: Adin Hector, MD  DOS: 04/09/2018  Note by: Dionisio David, NP  Service setting: Ambulatory outpatient  Specialty: Interventional Pain Management  Location: [facility] (AMB) Pain Management Facility    Patient type: Established   HPI  Reason for Visit: [name] is a 68 y.o. year old, female patient, who comes today with a chief complaint of Back Pain Last Appointment: Her last appointment at our practice was on 03/23/2018. I last saw her on 03/23/2018.  Pain Assessment: Today, [name] describes the severity of the Chronic pain as a 7 /10. She indicates the location/referral of the pain to be Back Lower/pain radiaties down legs at times. Onset was: More than a month ago. The quality of pain is described as Aching. Temporal description, or timing of pain is: Constant. Possible modifying factors: medications, sitting down. [name]  height is '5\' 3"'  (1.6 m) and weight is 230 lb (104.3 kg). Her temperature is 98 F (36.7 C). Her blood pressure is 146/88 (abnormal) and her pulse is 60. Her oxygen saturation is 97%.  Controlled Substance Pharmacotherapy Assessment REMS (Risk Evaluation and Mitigation Strategy)  Analgesic:Oxycodone IR 880m every 8hours (186mday) MME/day:22.80m92may BroChauncey FischerN  04/09/2018 11:39 AM  Sign when Signing Visit Nursing Pain Medication Assessment:  Safety precautions to be maintained throughout the outpatient stay will include: orient to surroundings, keep bed in low position, maintain call bell within reach at all times, provide assistance with transfer out of bed and ambulation.  Medication Inspection Compliance: Pill count conducted under aseptic conditions, in front of the patient. Neither the pills nor the bottle was removed from the patient's sight at any time. Once count was completed pills were immediately returned to the patient in their  original bottle.  Medication: Oxycodone IR Pill/Patch Count: 0 of 90 pills remain Pill/Patch Appearance: Markings consistent with prescribed medication Bottle Appearance: Standard pharmacy container. Clearly labeled. Filled Date: 1 / 15 [id number] Last Medication intake:  Ran out of medicine more than 48 hours ago   Pharmacokinetics: Liberation and absorption (onset of action): WNL Distribution (time to peak effect): WNL Metabolism and excretion (duration of action): WNL         Pharmacodynamics: Desired effects: Analgesia: [name] >50% benefit. Functional ability: Patient reports that medication allows her to accomplish basic ADLs Clinically meaningful improvement in function (CMIF): Sustained CMIF goals met Perceived effectiveness: Described as relatively effective, allowing for increase in activities of daily living (ADL) Undesirable effects: Side-effects or Adverse reactions: None reported Monitoring: [place] PMP: Online review of the past 12-93-monthiod conducted. Compliant with practice rules and regulations Last UDS on record: Summary  Date Value Ref Range Status  05/27/2017 FINAL  Final    Comment:    ==================================================================== TOXASSURE SELECT 13 (MW) ==================================================================== Test                             Result       Flag       Units Drug Present and Declared for Prescription Verification   Oxycodone                      427          EXPECTED   ng/mg creat   Oxymorphone  421          EXPECTED   ng/mg creat   Noroxycodone                   273          EXPECTED   ng/mg creat    Sources of oxycodone include scheduled prescription medications.    Oxymorphone and noroxycodone are expected metabolites of    oxycodone. Oxymorphone is also available as a scheduled    prescription medication. ==================================================================== Test                       Result    Flag   Units      Ref Range   Creatinine              48               mg/dL      >=20 ==================================================================== Declared Medications:  The flagging and interpretation on this report are based on the  following declared medications.  Unexpected results may arise from  inaccuracies in the declared medications.  **Note: The testing scope of this panel includes these medications:  Oxycodone  **Note: The testing scope of this panel does not include following  reported medications:  Amlodipine Besylate  Atorvastatin  Calcitriol  Cetirizine (Zyrtec)  Clobetasol (Temovate)  Duloxetine  Glimepiride (Amaryl)  Insulin (Humalog)  Insulin (Lantus)  Levothyroxine  Metoprolol  Nystatin  Telmisartan (Micardis)  Vitamin D2  Vitamin D2 (Drisdol) ==================================================================== For clinical consultation, please call [phone]. ====================================================================    UDS interpretation: Compliant          Medication Assessment Form: Reviewed. Patient indicates being compliant with therapy Treatment compliance: Compliant Risk Assessment Profile: Aberrant behavior: See initial evaluations. None observed or detected today Comorbid factors increasing risk of overdose: See initial evaluation. No additional risks detected today Opioid risk tool (ORT):  Opioid Risk  04/09/2018  Alcohol 0  Illegal Drugs 0  Rx Drugs 0  Alcohol 0  Illegal Drugs 0  Rx Drugs 0  Age between 16-45 years  0  History of Preadolescent Sexual Abuse 0  Psychological Disease 0  Depression 0  Opioid Risk Tool Scoring 0  Opioid Risk Interpretation Low Risk    ORT Scoring interpretation table:  Score <3 = Low Risk for SUD  Score between 4-7 = Moderate Risk for SUD  Score >8 = High Risk for Opioid Abuse   Risk of substance use disorder (SUD): Low  Risk Mitigation  Strategies:  Patient Counseling: Covered Patient-Prescriber Agreement (PPA): Present and active  Notification to other healthcare providers: Done  Pharmacologic Plan: No change in therapy, at this time.             ROS  Constitutional: Denies any fever or chills Gastrointestinal: No reported hemesis, hematochezia, vomiting, or acute GI distress Musculoskeletal: Denies any acute onset joint swelling, redness, loss of ROM, or weakness Neurological: No reported episodes of acute onset apraxia, aphasia, dysarthria, agnosia, amnesia, paralysis, loss of coordination, or loss of consciousness  Medication Review  DULoxetine, ONETOUCH DELICA LANCETS 97D, amLODipine, apixaban, atorvastatin, clobetasol cream, glucose blood, insulin glargine, insulin lispro, levothyroxine, and oxyCODONE  History Review  Allergy: [name] is allergic to buspirone; citalopram; lisinopril; metformin; pravastatin; sitagliptin; tramadol; diltiazem hcl; gabapentin; hydralazine; and lovastatin. Drug: [name]  reports no history of drug use. Alcohol:  reports no history of alcohol use. Tobacco:  reports that she quit smoking about  4 years ago. Her smoking use included cigarettes. She has never used smokeless tobacco. Social: [name]  reports that she quit smoking about 4 years ago. Her smoking use included cigarettes. She has never used smokeless tobacco. She reports that she does not drink alcohol or use drugs. Medical:  has a past medical history of Anemia, Anxiety, Aortic valve stenosis, Arthritis, B12 deficiency, Bowel obstruction ([place]), CKD (chronic kidney disease), Colostomy in place [facility]), Diabetes mellitus without complication ([place]), Diverticulitis large intestine, Dysrhythmia, Ectopic atrial tachycardia ([place]), FSGS (focal segmental glomerulosclerosis), Gastritis, GI bleed, Heart murmur, Hyperlipidemia, Hypertension, Hypothyroidism, Hypothyroidism, MRSA (methicillin resistant Staphylococcus aureus), Neuropathy,  Neuropathy in diabetes ([place]), Obesity, Pedal edema, Psoriasis, Shortness of breath dyspnea, and Vertigo. Surgical: [name]  has a past surgical history that includes Colectomy; laparotomy closure of cecal perforation (05/09/2013); Tonsillectomy; Back surgery; Cardiac catheterization; Abdominal hysterectomy; Debridement of abdominal wall abscess (N/A, 02/22/2015); Excision mass abdominal (N/A, 02/24/2015); Application if wound vac (N/A, 02/24/2015); Excision mass abdominal (N/A, 02/26/2015); Application if wound vac (N/A, 02/26/2015); Wound debridement (N/A, 03/03/2015); Wound debridement (N/A, 03/09/2015); Flexible sigmoidoscopy (06/19/2015); Colostomy reversal (N/A, 07/12/2015); laparotomy (07/12/2015); laparoscopy (07/12/2015); Lysis of adhesion (07/12/2015); laparotomy (N/A, 02/06/2015); Colonoscopy with propofol (N/A, 09/04/2016); and Cardiac valve replacement (08/2017). Family: family history includes CAD in her father; Colon cancer in her brother; Diabetes in her mother; Heart attack in her father; Hypertension in her father. Problem List: [name] has Chronic abdominal pain (Location of Primary Source of Pain) (Bilateral) (L>R); Neurogenic pain; Musculoskeletal pain; Visceral abdominal pain; Chronic low back pain (Location of Secondary source of pain) (Bilateral) (L>R); Chronic lower extremity pain (Location of Tertiary source of pain) (Bilateral) (R>L); Chronic hip pain (Bilateral) (L>R); Chronic knee pain (Bilateral) (L>R); Chronic neck pain (Bilateral) (L>R); Lumbar spondylosis; Lumbar facet syndrome (Bilateral) (L>R); Chronic pain syndrome; and Chronic pain of left upper extremity on their pertinent problem list.  Lab Review  Kidney Function Lab Results  Component Value Date   BUN 45 (H) 07/24/2017   CREATININE 2.42 (H) 07/24/2017   GFRAA 23 (L) 07/24/2017   GFRNONAA 20 (L) 07/24/2017  Liver Function Lab Results  Component Value Date   AST 19 07/24/2017   ALT 14 07/24/2017   ALBUMIN 3.8 07/24/2017   Note: Above Lab results reviewed.  Imaging Review  MR CERVICAL SPINE WO CONTRAST CLINICAL DATA:  68 year old with posterior neck pain radiating into the left shoulder for a couple months. Recent bilateral hand numbness. Previous cervical fusion.  EXAM: MRI CERVICAL SPINE WITHOUT CONTRAST  TECHNIQUE: Multiplanar, multisequence MR imaging of the cervical spine was performed. No intravenous contrast was administered.  COMPARISON:  Radiographs 09/06/2015. Report only from cervical MRI 12/16/2001.  FINDINGS: Alignment: Normal.  Vertebrae: Status post C4-6 ACDF. Interbody fusion appears solid. T1 hemangioma noted. No acute osseous findings are seen.  Cord: There is cord compression at C3-4 with probable cord hyperintensity, best seen on the axial images. The cervical cord is otherwise normal in signal and caliber.  Posterior Fossa, vertebral arteries, paraspinal tissues: Visualized portions of the posterior fossa and paraspinal soft tissues appear unremarkable. Bilateral vertebral artery flow voids.  Disc levels:  C2-3: Asymmetric facet hypertrophy on the right contributes to mild-to-moderate right foraminal narrowing. No cord deformity. The left foramen is patent.  C3-4: There is a large central disc extrusion, best seen on sagittal series 3. This markedly compresses the cervical cord, narrowing the AP diameter of the canal to approximately 4 mm. There is mild asymmetric facet hypertrophy on the right, contributing  to mild right foraminal narrowing. The left foramen appears patent.  C4-5: Solid interbody and posterolateral fusion. No spinal stenosis, foraminal narrowing or cord deformity.  C5-6: Solid interbody and posterolateral fusion. No spinal stenosis, foraminal narrowing or cord deformity.  C6-7: Mild disc bulging with mild left-greater-than-right facet hypertrophy. Mild left foraminal narrowing. No cord deformity.  C7-T1: There is spondylosis with posterior  osteophytes covering diffusely bulging disc material. Mild bilateral facet hypertrophy. No cord deformity. Mild foraminal narrowing bilaterally.  IMPRESSION: 1. Large disc extrusion at C3-4 with resulting cord compression. There is increased signal in the cord, suspicious for edema or early myelomalacia. Neuro surgical evaluation recommended. 2. Solid interbody fusion post C4-6 ACDF. No residual spinal stenosis at the operative levels. 3. Mild spondylosis at C6-7 and C7-T1 without cord deformity or high-grade foraminal narrowing. 4. Critical Value/emergent results were called by telephone at the time of interpretation on 08/01/2017 at 3:34 pm to Dr. Ramonita Lab , who verbally acknowledged these results.  Electronically Signed   By: Richardean Sale M.D.   On: 08/01/2017 15:34 Note: Above imaging results reviewed.        Physical Exam  General appearance: Well nourished, well developed, and well hydrated. In no apparent acute distress Mental status: Alert, oriented x 3 (person, place, & time)       Respiratory: No evidence of acute respiratory distress Eyes: PERLA Vitals: BP (!) 146/88   Pulse 60   Temp 98 F (36.7 C)   Ht '5\' 3"'  (1.6 m)   Wt 230 lb (104.3 kg)   SpO2 97%   BMI 40.74 kg/m  BMI: Estimated body mass index is 40.74 kg/m as calculated from the following:   Height as of this encounter: '5\' 3"'  (1.6 m).   Weight as of this encounter: 230 lb (104.3 kg). Ideal: Ideal body weight: 52.4 kg (115 lb 8.3 oz) Adjusted ideal body weight: 73.2 kg (161 lb 5 oz) Lumbar Spine Area Exam  Skin & Axial Inspection: No masses, redness, or swelling Alignment: Symmetrical Functional ROM: Unrestricted ROM       Stability: No instability detected Muscle Tone/Strength: Functionally intact. No obvious neuro-muscular anomalies detected. Sensory (Neurological): Unimpaired Palpation: Uncomfortable       Provocative Tests: Hyperextension/rotation test: deferred today       Lumbar quadrant  test (Kemp's test): deferred today       Lateral bending test: deferred today       Patrick's Maneuver: deferred today                     Assessment   Status Diagnosis  Persistent Controlled Persistent 1. Lumbar spondylosis   2. Visceral abdominal pain   3. Musculoskeletal pain   4. Chronic pain syndrome   5. Long term current use of opiate analgesic      Updated Problems: No problems updated.  Plan of Care  Pharmacotherapy (Medications Ordered): Meds ordered this encounter  Medications  . oxyCODONE (OXY IR/ROXICODONE) 5 MG immediate release tablet    Sig: Take 1 tablet (5 mg total) by mouth every 8 (eight) hours as needed for up to 30 days for severe pain.    Dispense:  90 tablet    Refill:  0    Do not add this medication to the electronic "Automatic Refill" notification system. Patient may have prescription filled one day early if pharmacy is closed on scheduled refill date.    Order Specific Question:   Supervising Provider    Answer:  [place], [place] oxyCODONE (OXY IR/ROXICODONE) 5 MG immediate release tablet    Sig: Take 1 tablet (5 mg total) by mouth every 8 (eight) hours as needed for up to 30 days for severe pain.    Dispense:  90 tablet    Refill:  0    Do not add this medication to the electronic "Automatic Refill" notification system. Patient may have prescription filled one day early if pharmacy is closed on scheduled refill date.    Order Specific Question:   Supervising Provider    Answer:   Milinda Pointer [phone]  . oxyCODONE (OXY IR/ROXICODONE) 5 MG immediate release tablet    Sig: Take 1 tablet (5 mg total) by mouth every 8 (eight) hours as needed for up to 30 days for severe pain.    Dispense:  90 tablet    Refill:  0    Do not add this medication to the electronic "Automatic Refill" notification system. Patient may have prescription filled one day early if pharmacy is closed on scheduled refill date.    Order Specific Question:    Supervising Provider    Answer:   Milinda Pointer [[id number]]   Administered today: Neomia Glass. [name] had no medications administered during this visit.  Orders:  Orders Placed This Encounter  Procedures  . ToxASSURE Select 13 (MW), Urine    Volume: 30 ml(s). Minimum 3 ml of urine is needed. Document temperature of fresh sample. Indications: Long term (current) use of opiate analgesic (J09.326)   Interventional options: Planned follow-up:   Not at this time.  Plan: Return in about 3 months (around 07/08/2018) for MedMgmt.   Considering:  Diagnostic bilateral lumbar facet block  Possible bilateral lumbar facet rate frequency ablation.  Diagnostic bilateral celiac plexus block  Diagnostic bilateral intra-articular hip injection  Possible diagnostic bilateral femoral nerve and obturator nerve articular branch blocks  Possible bilateral femoral nerve and obturator nerve articular branch frequency ablation.  Diagnostic bilateral intra-articular knee joint injection  Possible series of 5 bilateral intra-articular Hyalgan knee injections.  Bilateral diagnostic genicular nerve block  Possible bilateral genicular nerve radiofrequency ablation    Palliative PRN treatment(s):  Diagnostic bilateral lumbar facet block  Diagnostic bilateral celiac plexus block  Diagnostic bilateral intra-articular hip injection  Possible diagnostic bilateral femoral nerve and obturator nerve articular branch blocks  Diagnostic bilateral intra-articular knee joint injection Bilateral diagnostic genicular nerve block     Note by: Dionisio David, NP Date: 04/09/2018; Time: 11:29 PM

## 2018-04-16 LAB — TOXASSURE SELECT 13 (MW), URINE

## 2018-06-19 ENCOUNTER — Other Ambulatory Visit: Payer: Self-pay | Admitting: Nurse Practitioner

## 2018-07-06 ENCOUNTER — Other Ambulatory Visit: Payer: Self-pay

## 2018-07-06 ENCOUNTER — Ambulatory Visit: Payer: Medicare Other | Attending: Nurse Practitioner | Admitting: Nurse Practitioner

## 2018-07-06 DIAGNOSIS — R109 Unspecified abdominal pain: Secondary | ICD-10-CM

## 2018-07-06 DIAGNOSIS — G894 Chronic pain syndrome: Secondary | ICD-10-CM

## 2018-07-06 DIAGNOSIS — G959 Disease of spinal cord, unspecified: Secondary | ICD-10-CM

## 2018-07-06 DIAGNOSIS — M47816 Spondylosis without myelopathy or radiculopathy, lumbar region: Secondary | ICD-10-CM | POA: Diagnosis not present

## 2018-07-06 DIAGNOSIS — M7918 Myalgia, other site: Secondary | ICD-10-CM

## 2018-07-06 MED ORDER — OXYCODONE HCL 5 MG PO TABS: 5.0000 mg | ORAL_TABLET | Freq: Three times a day (TID) | ORAL | 0 refills | Status: DC | PRN

## 2018-07-06 NOTE — Progress Notes (Signed)
Pain Management Encounter Note - Virtual Visit via Telephone Telehealth (real-time audio visits between healthcare provider and patient).  Patient's Phone No. & Preferred Pharmacy:  [phone] (home); There is no such number on file (mobile).; (Preferred) [phone]  [id number] [address], [place] [address] Phone: [phone] Fax: [phone]   Pre-screening note:  Our staff contacted [name] and offered her an "in person", "face-to-face" appointment versus a telephone encounter. She indicated preferring the telephone encounter, at this time.  Reason for Virtual Visit: COVID-19*  Social distancing based on CDC and AMA recommendations.   I contacted [name] on 07/06/2018 at 10:47 AM by telephone and clearly identified myself as Alicia David, NP. I verified that I was speaking with the correct person using two identifiers (Name and date of birth: 09-Mar-1950).  Advanced Informed Consent I sought verbal advanced consent from [name] for telemedicine interactions and virtual visit. I informed [name] of the security and privacy concerns, risks, and limitations associated with performing an evaluation and management service by telephone. I also informed [name] of the availability of "in person" appointments and I informed her of the possibility of a patient responsible charge related to this service. [name] expressed understanding and agreed to proceed.   Historic Elements   [name] is a 68 y.o. year old, female patient evaluated today after her last encounter by our practice on 06/19/2018. [name]  has a past medical history of Anemia, Anxiety, Aortic valve stenosis, Arthritis, B12 deficiency, Bowel obstruction ([place]), CKD (chronic kidney disease), Colostomy in place [facility]), Diabetes mellitus without complication ([place]), Diverticulitis large intestine, Dysrhythmia, Ectopic atrial tachycardia ([place]), FSGS (focal segmental  glomerulosclerosis), Gastritis, GI bleed, Heart murmur, Hyperlipidemia, Hypertension, Hypothyroidism, Hypothyroidism, MRSA (methicillin resistant Staphylococcus aureus), Neuropathy, Neuropathy in diabetes ([place]), Obesity, Pedal edema, Psoriasis, Shortness of breath dyspnea, and Vertigo. She also  has a past surgical history that includes Colectomy; laparotomy closure of cecal perforation (05/09/2013); Tonsillectomy; Back surgery; Cardiac catheterization; Abdominal hysterectomy; Debridement of abdominal wall abscess (N/A, 02/22/2015); Excision mass abdominal (N/A, 02/24/2015); Application if wound vac (N/A, 02/24/2015); Excision mass abdominal (N/A, 02/26/2015); Application if wound vac (N/A, 02/26/2015); Wound debridement (N/A, 03/03/2015); Wound debridement (N/A, 03/09/2015); Flexible sigmoidoscopy (06/19/2015); Colostomy reversal (N/A, 07/12/2015); laparotomy (07/12/2015); laparoscopy (07/12/2015); Lysis of adhesion (07/12/2015); laparotomy (N/A, 02/06/2015); Colonoscopy with propofol (N/A, 09/04/2016); and Cardiac valve replacement (08/2017). [name] has a current medication list which includes the following prescription(s): amlodipine, apixaban, atorvastatin, clobetasol cream, duloxetine, glucose blood, glucose blood, insulin glargine, insulin lispro, levothyroxine, onetouch delica lancets 70Y, oxycodone, oxycodone, and oxycodone. She  reports that she quit smoking about 5 years ago. Her smoking use included cigarettes. She has never used smokeless tobacco. She reports that she does not drink alcohol or use drugs. [name] is [name] to buspirone; citalopram; lisinopril; metformin; pravastatin; sitagliptin; tramadol; diltiazem hcl; gabapentin; hydralazine; and lovastatin.   HPI  I last saw her on 06/19/2018. She is being evaluated for medication management. She is having 5/10 low back pain with occasional neck pain. She admits that the pain affects her arms and legs. She has increased pain with standing. She has numbness and  tingling. She is having generalized weakness. She admits that the weakness has been ongoing. She has been seen by her PCP and was started on Vitamin B12. She is also having increased heart rate. She was admits that she will follow up with Dr Nehemiah Massed today. She  was started on furosemide and amiodarone. Her Metoprolol was also increased.  She has had to take an extra Oxycodone some nights because of the pain.   Pharmacotherapy Assessment  Analgesic:Oxycodone IR 5mg  every 8hours (15mg /day) MME/day:22.5mg /day Monitoring: Pharmacotherapy: No side-effects or adverse reactions reported. [place] PMP: PDMP reviewed during this encounter.       Compliance: No problems identified. see above Plan: Refer to "POC".  Review of recent tests  MR CERVICAL SPINE WO CONTRAST CLINICAL DATA:  68 year old with posterior neck pain radiating into the left shoulder for a couple months. Recent bilateral hand numbness. Previous cervical fusion.  EXAM: MRI CERVICAL SPINE WITHOUT CONTRAST  TECHNIQUE: Multiplanar, multisequence MR imaging of the cervical spine was performed. No intravenous contrast was administered.  COMPARISON:  Radiographs 09/06/2015. Report only from cervical MRI 12/16/2001.  FINDINGS: Alignment: Normal.  Vertebrae: Status post C4-6 ACDF. Interbody fusion appears solid. T1 hemangioma noted. No acute osseous findings are seen.  Cord: There is cord compression at C3-4 with probable cord hyperintensity, best seen on the axial images. The cervical cord is otherwise normal in signal and caliber.  Posterior Fossa, vertebral arteries, paraspinal tissues: Visualized portions of the posterior fossa and paraspinal soft tissues appear unremarkable. Bilateral vertebral artery flow voids.  Disc levels:  C2-3: Asymmetric facet hypertrophy on the right contributes to mild-to-moderate right foraminal narrowing. No cord deformity. The left foramen is patent.  C3-4: There is a large central disc  extrusion, best seen on sagittal series 3. This markedly compresses the cervical cord, narrowing the AP diameter of the canal to approximately 4 mm. There is mild asymmetric facet hypertrophy on the right, contributing to mild right foraminal narrowing. The left foramen appears patent.  C4-5: Solid interbody and posterolateral fusion. No spinal stenosis, foraminal narrowing or cord deformity.  C5-6: Solid interbody and posterolateral fusion. No spinal stenosis, foraminal narrowing or cord deformity.  C6-7: Mild disc bulging with mild left-greater-than-right facet hypertrophy. Mild left foraminal narrowing. No cord deformity.  C7-T1: There is spondylosis with posterior osteophytes covering diffusely bulging disc material. Mild bilateral facet hypertrophy. No cord deformity. Mild foraminal narrowing bilaterally.  IMPRESSION: 1. Large disc extrusion at C3-4 with resulting cord compression. There is increased signal in the cord, suspicious for edema or early myelomalacia. Neuro surgical evaluation recommended. 2. Solid interbody fusion post C4-6 ACDF. No residual spinal stenosis at the operative levels. 3. Mild spondylosis at C6-7 and C7-T1 without cord deformity or high-grade foraminal narrowing. 4. Critical Value/emergent results were called by telephone at the time of interpretation on 08/01/2017 at 3:34 pm to Dr. Ramonita Lab , who verbally acknowledged these results.  Electronically Signed   By: Richardean Sale M.D.   On: 08/01/2017 15:34   Office Visit on 04/09/2018  Component Date Value Ref Range Status  . Summary 04/09/2018 FINAL   Final   Comment: ==================================================================== TOXASSURE SELECT 13 (MW) ==================================================================== Test                             Result       Flag       Units Drug Present not Declared for Prescription Verification   Oxycodone                      386           UNEXPECTED ng/mg creat   Oxymorphone  614          UNEXPECTED ng/mg creat   Noroxycodone                   349          UNEXPECTED ng/mg creat   Noroxymorphone                 141          UNEXPECTED ng/mg creat    Sources of oxycodone are scheduled prescription medications.    Oxymorphone, noroxycodone, and noroxymorphone are expected    metabolites of oxycodone. Oxymorphone is also available as a    scheduled prescription medication. ==================================================================== Test                      Result    Flag   Units      Ref Range   Creatinine              76               mg/dL      >=20 ======                          ============================================================== Declared Medications:  The flagging and interpretation on this report are based on the  following declared medications.  Unexpected results may arise from  inaccuracies in the declared medications.  **Note: The testing scope of this panel does not include following  reported medications:  Amlodipine Besylate  Apixaban  Atorvastatin  Clobetasol  Duloxetine  Insulin (Lantus)  Levothyroxine ==================================================================== For clinical consultation, please call [phone]. ====================================================================    Assessment  The primary encounter diagnosis was Lumbar spondylosis. Diagnoses of Chronic pain syndrome, Visceral abdominal pain, Musculoskeletal pain, Cervical myelopathy (HCC), and Myofascial pain were also pertinent to this visit.  Plan of Care  I am having Kathy Wahid. Gartrell maintain her insulin glargine, amLODipine, OneTouch Delica Lancets 09W, glucose blood, atorvastatin, DULoxetine, levothyroxine, clobetasol cream, insulin lispro, glucose blood, apixaban, oxyCODONE, oxyCODONE, and oxyCODONE.  Pharmacotherapy (Medications Ordered): Meds ordered this encounter   Medications  . oxyCODONE (OXY IR/ROXICODONE) 5 MG immediate release tablet    Sig: Take 1 tablet (5 mg total) by mouth every 8 (eight) hours as needed for up to 30 days for severe pain.    Dispense:  90 tablet    Refill:  0    Do not add this medication to the electronic "Automatic Refill" notification system. Patient may have prescription filled one day early if pharmacy is closed on scheduled refill date.    Order Specific Question:   Supervising Provider    Answer:   Milinda Pointer [phone]  . oxyCODONE (OXY IR/ROXICODONE) 5 MG immediate release tablet    Sig: Take 1 tablet (5 mg total) by mouth every 8 (eight) hours as needed for up to 30 days for severe pain.    Dispense:  90 tablet    Refill:  0    Do not add this medication to the electronic "Automatic Refill" notification system. Patient may have prescription filled one day early if pharmacy is closed on scheduled refill date.    Order Specific Question:   Supervising Provider    Answer:   Milinda Pointer [phone]  . oxyCODONE (OXY IR/ROXICODONE) 5 MG immediate release tablet    Sig: Take 1 tablet (5 mg total) by mouth every 8 (eight) hours as needed for up to 30  days for severe pain.    Dispense:  90 tablet    Refill:  0    Do not add this medication to the electronic "Automatic Refill" notification system. Patient may have prescription filled one day early if pharmacy is closed on scheduled refill date.    Order Specific Question:   Supervising Provider    Answer:   Milinda Pointer [phone]   Orders:  No orders of the defined types were placed in this encounter.  Follow-up plan:   Return in about 3 months (around 10/06/2018) for MedMgmt.   I discussed the assessment and treatment plan with the patient. The patient was provided an opportunity to ask questions and all were answered. The patient agreed with the plan and demonstrated an understanding of the instructions.  Patient advised to call back or seek an  in-person evaluation if the symptoms or condition worsens.  Total duration of non-face-to-face encounter:14 minutes.  Note by: Alicia David, NP Date: 07/06/2018; Time: 12:51 PM  Disclaimer:  * Given the special circumstances of the COVID-19 pandemic, the federal government has announced that the Office for Civil Rights (OCR) will exercise its enforcement discretion and will not impose penalties on physicians using telehealth in the event of noncompliance with regulatory requirements under the [place] (HIPAA) in connection with the good faith provision of telehealth during the PJSRP-59 national public health emergency. ([place])

## 2018-07-06 NOTE — Patient Instructions (Signed)
____________________________________________________________________________________________  Medication Rules  Purpose: To inform patients, and their family members, of our rules and regulations.  Applies to: All patients receiving prescriptions (written or electronic).  Pharmacy of record: Pharmacy where electronic prescriptions will be sent. If written prescriptions are taken to a different pharmacy, please inform the nursing staff. The pharmacy listed in the electronic medical record should be the one where you would like electronic prescriptions to be sent.  Electronic prescriptions: In compliance with the [place] Strengthen Opioid Misuse Prevention (STOP) Act of 2017 (Session Law 2017-74/H243), effective February 18, 2018, all controlled substances must be electronically prescribed. Calling prescriptions to the pharmacy will cease to exist.  Prescription refills: Only during scheduled appointments. Applies to all prescriptions.  NOTE: The following applies primarily to controlled substances (Opioid* Pain Medications).   Patient's responsibilities: 1. Pain Pills: Bring all pain pills to every appointment (except for procedure appointments). 2. Pill Bottles: Bring pills in original pharmacy bottle. Always bring the newest bottle. Bring bottle, even if empty. 3. Medication refills: You are responsible for knowing and keeping track of what medications you take and those you need refilled. The day before your appointment: write a list of all prescriptions that need to be refilled. The day of the appointment: give the list to the admitting nurse. Prescriptions will be written only during appointments. No prescriptions will be written on procedure days. If you forget a medication: it will not be "Called in", "Faxed", or "electronically sent". You will need to get another appointment to get these prescribed. No early refills. Do not call asking to have your prescription filled  early. 4. Prescription Accuracy: You are responsible for carefully inspecting your prescriptions before leaving our office. Have the discharge nurse carefully go over each prescription with you, before taking them home. Make sure that your name is accurately spelled, that your address is correct. Check the name and dose of your medication to make sure it is accurate. Check the number of pills, and the written instructions to make sure they are clear and accurate. Make sure that you are given enough medication to last until your next medication refill appointment. 5. Taking Medication: Take medication as prescribed. When it comes to controlled substances, taking less pills or less frequently than prescribed is permitted and encouraged. Never take more pills than instructed. Never take medication more frequently than prescribed.  6. Inform other Doctors: Always inform, all of your healthcare providers, of all the medications you take. 7. Pain Medication from other Providers: You are not allowed to accept any additional pain medication from any other Doctor or Healthcare provider. There are two exceptions to this rule. (see below) In the event that you require additional pain medication, you are responsible for notifying us, as stated below. 8. Medication Agreement: You are responsible for carefully reading and following our Medication Agreement. This must be signed before receiving any prescriptions from our practice. Safely store a copy of your signed Agreement. Violations to the Agreement will result in no further prescriptions. (Additional copies of our Medication Agreement are available upon request.) 9. Laws, Rules, & Regulations: All patients are expected to follow all Federal and State Laws, Statutes, Rules, & Regulations. Ignorance of the Laws does not constitute a valid excuse. The use of any illegal substances is prohibited. 10. Adopted CDC guidelines & recommendations: Target dosing levels will be  at or below 60 MME/day. Use of benzodiazepines** is not recommended.  Exceptions: There are only two exceptions to the rule of not   receiving pain medications from other Healthcare Providers. 1. Exception #1 (Emergencies): In the event of an emergency (i.e.: accident requiring emergency care), you are allowed to receive additional pain medication. However, you are responsible for: As soon as you are able, call our office [phone], at any time of the day or night, and leave a message stating your name, the date and nature of the emergency, and the name and dose of the medication prescribed. In the event that your call is answered by a member of our staff, make sure to document and save the date, time, and the name of the person that took your information.  2. Exception #2 (Planned Surgery): In the event that you are scheduled by another doctor or dentist to have any type of surgery or procedure, you are allowed (for a period no longer than 30 days), to receive additional pain medication, for the acute post-op pain. However, in this case, you are responsible for picking up a copy of our "Post-op Pain Management for Surgeons" handout, and giving it to your surgeon or dentist. This document is available at our office, and does not require an appointment to obtain it. Simply go to our office during business hours (Monday-Thursday from 8:00 AM to 4:00 PM) (Friday 8:00 AM to 12:00 Noon) or if you have a scheduled appointment with us, prior to your surgery, and ask for it by name. In addition, you will need to provide us with your name, name of your surgeon, type of surgery, and date of procedure or surgery.  *Opioid medications include: morphine, codeine, oxycodone, oxymorphone, hydrocodone, hydromorphone, meperidine, tramadol, tapentadol, buprenorphine, fentanyl, methadone. **Benzodiazepine medications include: diazepam (Valium), alprazolam (Xanax), clonazepam (Klonopine), lorazepam (Ativan), clorazepate  (Tranxene), chlordiazepoxide (Librium), estazolam (Prosom), oxazepam (Serax), temazepam (Restoril), triazolam (Halcion) (Last updated: 04/17/2017) ____________________________________________________________________________________________    

## 2018-07-07 ENCOUNTER — Telehealth: Payer: Self-pay

## 2018-07-07 NOTE — Telephone Encounter (Signed)
She wants to get her medicine today instead of tomorrow but Crystal would need to call the pharmacy to let them know its ok

## 2018-07-08 ENCOUNTER — Encounter: Payer: Medicare Other | Admitting: Nurse Practitioner

## 2018-07-10 ENCOUNTER — Other Ambulatory Visit: Payer: Self-pay

## 2018-07-10 ENCOUNTER — Other Ambulatory Visit
Admission: RE | Admit: 2018-07-10 | Discharge: 2018-07-10 | Disposition: A | Discharge status: Home / Self Care | Payer: Medicare Other | Source: Ambulatory Visit | Attending: Internal Medicine | Admitting: Internal Medicine

## 2018-07-10 DIAGNOSIS — Z1159 Encounter for screening for other viral diseases: Secondary | ICD-10-CM | POA: Insufficient documentation

## 2018-07-10 DIAGNOSIS — Z01812 Encounter for preprocedural laboratory examination: Secondary | ICD-10-CM | POA: Insufficient documentation

## 2018-07-11 LAB — NOVEL CORONAVIRUS, NAA (HOSP ORDER, SEND-OUT TO REF LAB; TAT 18-24 HRS): SARS-CoV-2, NAA: NOT DETECTED

## 2018-07-12 ENCOUNTER — Inpatient Hospital Stay
Admission: EM | Admit: 2018-07-12 | Discharge: 2018-07-15 | DRG: 291 | Disposition: A | Discharge status: Home / Self Care | Payer: Medicare Other | Attending: Internal Medicine | Admitting: Internal Medicine

## 2018-07-12 ENCOUNTER — Emergency Department: Payer: Medicare Other

## 2018-07-12 ENCOUNTER — Encounter: Payer: Self-pay | Admitting: Emergency Medicine

## 2018-07-12 ENCOUNTER — Other Ambulatory Visit: Payer: Self-pay

## 2018-07-12 DIAGNOSIS — M8580 Other specified disorders of bone density and structure, unspecified site: Secondary | ICD-10-CM | POA: Diagnosis present

## 2018-07-12 DIAGNOSIS — Z79899 Other long term (current) drug therapy: Secondary | ICD-10-CM

## 2018-07-12 DIAGNOSIS — Z79890 Hormone replacement therapy (postmenopausal): Secondary | ICD-10-CM

## 2018-07-12 DIAGNOSIS — J9601 Acute respiratory failure with hypoxia: Secondary | ICD-10-CM | POA: Diagnosis present

## 2018-07-12 DIAGNOSIS — E782 Mixed hyperlipidemia: Secondary | ICD-10-CM | POA: Diagnosis present

## 2018-07-12 DIAGNOSIS — E1122 Type 2 diabetes mellitus with diabetic chronic kidney disease: Secondary | ICD-10-CM | POA: Diagnosis present

## 2018-07-12 DIAGNOSIS — Z7901 Long term (current) use of anticoagulants: Secondary | ICD-10-CM | POA: Diagnosis not present

## 2018-07-12 DIAGNOSIS — G894 Chronic pain syndrome: Secondary | ICD-10-CM | POA: Diagnosis present

## 2018-07-12 DIAGNOSIS — E1142 Type 2 diabetes mellitus with diabetic polyneuropathy: Secondary | ICD-10-CM | POA: Diagnosis present

## 2018-07-12 DIAGNOSIS — E039 Hypothyroidism, unspecified: Secondary | ICD-10-CM | POA: Diagnosis present

## 2018-07-12 DIAGNOSIS — L409 Psoriasis, unspecified: Secondary | ICD-10-CM | POA: Diagnosis present

## 2018-07-12 DIAGNOSIS — R0602 Shortness of breath: Secondary | ICD-10-CM | POA: Diagnosis present

## 2018-07-12 DIAGNOSIS — M19072 Primary osteoarthritis, left ankle and foot: Secondary | ICD-10-CM | POA: Diagnosis present

## 2018-07-12 DIAGNOSIS — I5021 Acute systolic (congestive) heart failure: Secondary | ICD-10-CM

## 2018-07-12 DIAGNOSIS — N179 Acute kidney failure, unspecified: Secondary | ICD-10-CM | POA: Diagnosis present

## 2018-07-12 DIAGNOSIS — I509 Heart failure, unspecified: Secondary | ICD-10-CM

## 2018-07-12 DIAGNOSIS — Z90710 Acquired absence of both cervix and uterus: Secondary | ICD-10-CM

## 2018-07-12 DIAGNOSIS — Z87891 Personal history of nicotine dependence: Secondary | ICD-10-CM | POA: Diagnosis not present

## 2018-07-12 DIAGNOSIS — Z8249 Family history of ischemic heart disease and other diseases of the circulatory system: Secondary | ICD-10-CM

## 2018-07-12 DIAGNOSIS — Z79891 Long term (current) use of opiate analgesic: Secondary | ICD-10-CM

## 2018-07-12 DIAGNOSIS — Z9049 Acquired absence of other specified parts of digestive tract: Secondary | ICD-10-CM | POA: Diagnosis not present

## 2018-07-12 DIAGNOSIS — Z952 Presence of prosthetic heart valve: Secondary | ICD-10-CM | POA: Diagnosis not present

## 2018-07-12 DIAGNOSIS — I420 Dilated cardiomyopathy: Secondary | ICD-10-CM | POA: Diagnosis present

## 2018-07-12 DIAGNOSIS — M19071 Primary osteoarthritis, right ankle and foot: Secondary | ICD-10-CM | POA: Diagnosis present

## 2018-07-12 DIAGNOSIS — N184 Chronic kidney disease, stage 4 (severe): Secondary | ICD-10-CM | POA: Diagnosis present

## 2018-07-12 DIAGNOSIS — I130 Hypertensive heart and chronic kidney disease with heart failure and stage 1 through stage 4 chronic kidney disease, or unspecified chronic kidney disease: Principal | ICD-10-CM | POA: Diagnosis present

## 2018-07-12 DIAGNOSIS — Z833 Family history of diabetes mellitus: Secondary | ICD-10-CM

## 2018-07-12 DIAGNOSIS — Z800 Family history of malignant neoplasm of digestive organs: Secondary | ICD-10-CM

## 2018-07-12 DIAGNOSIS — R0902 Hypoxemia: Secondary | ICD-10-CM

## 2018-07-12 DIAGNOSIS — I4819 Other persistent atrial fibrillation: Secondary | ICD-10-CM | POA: Diagnosis present

## 2018-07-12 DIAGNOSIS — I5023 Acute on chronic systolic (congestive) heart failure: Secondary | ICD-10-CM | POA: Diagnosis present

## 2018-07-12 LAB — BASIC METABOLIC PANEL
Anion gap: 9 (ref 5–15)
BUN: 46 mg/dL — ABNORMAL HIGH (ref 8–23)
CO2: 27 mmol/L (ref 22–32)
Calcium: 8.7 mg/dL — ABNORMAL LOW (ref 8.9–10.3)
Chloride: 103 mmol/L (ref 98–111)
Creatinine, Ser: 3.39 mg/dL — ABNORMAL HIGH (ref 0.44–1.00)
GFR calc Af Amer: 15 mL/min — ABNORMAL LOW (ref >60)
GFR calc non Af Amer: 13 mL/min — ABNORMAL LOW (ref >60)
Glucose, Bld: 104 mg/dL — ABNORMAL HIGH (ref 70–99)
Potassium: 4.5 mmol/L (ref 3.5–5.1)
Sodium: 139 mmol/L (ref 135–145)

## 2018-07-12 LAB — CBC
HCT: 40.2 % (ref 36.0–46.0)
Hemoglobin: 12 g/dL (ref 12.0–15.0)
MCH: 26 pg (ref 26.0–34.0)
MCHC: 29.9 g/dL — ABNORMAL LOW (ref 30.0–36.0)
MCV: 87 fL (ref 80.0–100.0)
Platelets: 228 10*3/uL (ref 150–400)
RBC: 4.62 MIL/uL (ref 3.87–5.11)
RDW: 16 % — ABNORMAL HIGH (ref 11.5–15.5)
WBC: 11.2 10*3/uL — ABNORMAL HIGH (ref 4.0–10.5)
nRBC: 0 % (ref 0.0–0.2)

## 2018-07-12 LAB — TROPONIN I: Troponin I: <0.03 ng/mL (ref <0.03)

## 2018-07-12 MED ORDER — FUROSEMIDE 10 MG/ML IJ SOLN
40.0000 mg | Freq: Once | INTRAMUSCULAR | Status: AC
  Administered 2018-07-12: 40 mg via INTRAVENOUS
  Filled 2018-07-12: qty 4

## 2018-07-12 NOTE — ED Provider Notes (Signed)
[facility] Emergency Department Provider Note   ____________________________________________   I have reviewed the triage vital signs and the nursing notes.   HISTORY  Chief Complaint Shortness of Breath   History limited by: Not Limited   HPI [name] is a 68 y.o. female who presents to the emergency department today because of concerns for shortness of breath.  The patient has a history of fast heart rate and is scheduled for cardioversion next week.  She does think that some of her shortness of breath might be related to that.  Additionally the patient states that she has been short of breath since starting a B12 pill.  Today however her shortness of breath felt somewhat worse.  She denies any cough.  She denies any associated chest pain.  Did feel hot but had no measured fevers.  Had a negative COVID test performed 2 days ago in anticipation of the cardioversion.  Records reviewed. Per medical record review patient has a history of DM, CKD.   Past Medical History:  Diagnosis Date  . Anemia   . Anxiety   . Aortic valve stenosis   . Arthritis    feet, legs  . B12 deficiency   . Bowel obstruction ([place])   . CKD (chronic kidney disease)    protein in urine  . Colostomy in place [facility])   . Diabetes mellitus without complication ([place])   . Diverticulitis large intestine   . Dysrhythmia   . Ectopic atrial tachycardia ([place])   . FSGS (focal segmental glomerulosclerosis)   . Gastritis   . GI bleed   . Heart murmur   . Hyperlipidemia   . Hypertension   . Hypothyroidism   . Hypothyroidism    unspecified  . MRSA (methicillin resistant Staphylococcus aureus)    at abdominal wound.  Jan 2017.  Treated.   . Neuropathy   . Neuropathy in diabetes ([place])   . Obesity   . Pedal edema   . Psoriasis   . Shortness of breath dyspnea   . Vertigo     Patient Active Problem List   Diagnosis Date Noted  . DM type 2 with diabetic peripheral neuropathy ([place])  11/25/2017  . 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 Acute kidney injury superimposed on chronic kidney disease ([place]) 09/01/2017  . CHF (congestive heart failure), NYHA class II, chronic, diastolic ([place]) 86/75/4492  . Paroxysmal A-fib ([place]) 08/14/2017  . Cervical myelopathy ([place]) 08/07/2017  . Onychomycosis of multiple toenails with type 2 diabetes mellitus and peripheral neuropathy ([place]) 06/23/2017  . Myofascial pain 05/27/2017  . Chronic pain of left upper extremity 05/27/2017  . Morbid obesity ([place]) 05/06/2017  . Uncontrolled type 2 diabetes mellitus with hyperglycemia, with long-term current use of insulin ([place]) 02/07/2017  . Hyperlipidemia, mixed 08/08/2016  . Hx of adenomatous colonic polyps 06/14/2016  . 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 Chronic pain syndrome 03/11/2016  . Obesity, Class II, BMI 35-39.9 03/11/2016  . Osteopenia of multiple sites 01/26/2016  . Heart burn 12/18/2015  . Polyneuropathy 11/16/2015  . Lumbar spondylosis 09/25/2015  . Lumbar facet syndrome (Bilateral) (L>R) 09/25/2015  . Recurrent major depressive disorder, in full remission ([place]) 09/19/2015  . Diabetic neuropathy ([place]) 09/06/2015  . H/O colostomy 07/12/2015  . Chronic abdominal pain (Location of Primary Source of Pain) (Bilateral) (L>R) 07/03/2015  . Long term current use of opiate analgesic 07/03/2015  . Long term prescription opiate use 07/03/2015  . Opiate use (22.5 MME/Day)  07/03/2015  . Encounter for therapeutic drug level monitoring 07/03/2015  . Encounter for pain management planning 07/03/2015  . Neurogenic pain 07/03/2015  . Musculoskeletal pain 07/03/2015  . Visceral abdominal pain 07/03/2015  . Diabetic peripheral neuropathy ([place]) 07/03/2015  . Chronic low back pain (Location of Secondary source of pain) (Bilateral) (L>R) 07/03/2015  . Chronic lower extremity pain (Location of Tertiary source of pain) (Bilateral) (R>L) 07/03/2015  .  Chronic hip pain (Bilateral) (L>R) 07/03/2015  . Chronic knee pain (Bilateral) (L>R) 07/03/2015  . Chronic neck pain (Bilateral) (L>R) 07/03/2015  . Anemia 02/16/2015  . Arthritis 02/16/2015  . CKD (chronic kidney disease) stage 4, GFR 15-29 ml/min (HCC) 02/16/2015  . Focal and segmental hyalinosis 02/16/2015  . Bleeding gastrointestinal 02/16/2015  . Acquired atrophy of thyroid 02/16/2015  . Psoriasis 02/16/2015  . Small bowel obstruction ([place]) 02/04/2015  . Severe aortic stenosis 10/11/2014  . Paroxysmal supraventricular tachycardia ([place]) 10/11/2014  . SOBOE (shortness of breath on exertion) 10/11/2014  . Benign essential HTN 10/04/2014  . Obesity 09/05/2014  . B12 deficiency 03/02/2014  . Diabetes mellitus type 2 in obese ([place]) 12/01/2013  . Abnormal presence of protein in urine 10/15/2010    Past Surgical History:  Procedure Laterality Date  . ABDOMINAL HYSTERECTOMY    . APPLICATION OF WOUND VAC N/A 02/24/2015   Procedure: APPLICATION OF WOUND VAC;  Surgeon: Clayburn Pert, MD;  Location: [facility] ORS;  Service: General;  Laterality: N/A;  . APPLICATION OF WOUND VAC N/A 02/26/2015   Procedure: APPLICATION OF WOUND VAC;  Surgeon: Clayburn Pert, MD;  Location: [facility] ORS;  Service: General;  Laterality: N/A;  . BACK SURGERY     spur frmoved from lower back  . CARDIAC CATHETERIZATION    . CARDIAC VALVE REPLACEMENT  08/2017   [facility]  . COLECTOMY    . COLONOSCOPY WITH PROPOFOL N/A 09/04/2016   Procedure: COLONOSCOPY WITH PROPOFOL;  Surgeon: Manya Silvas, MD;  Location: [facility] ENDOSCOPY;  Service: Endoscopy;  Laterality: N/A;  . COLOSTOMY REVERSAL N/A 07/12/2015   Procedure: COLOSTOMY REVERSAL;  Surgeon: Clayburn Pert, MD;  Location: [facility] ORS;  Service: General;  Laterality: N/A;  . DEBRIDEMENT OF ABDOMINAL WALL ABSCESS N/A 02/22/2015   Procedure: DEBRIDEMENT OF ABDOMINAL WALL ABSCESS;  Surgeon: Clayburn Pert, MD;  Location: [facility] ORS;  Service: General;  Laterality: N/A;  . EXCISION  MASS ABDOMINAL N/A 02/24/2015   Procedure: EXCISION MASS ABDOMINAL  / [place];  Surgeon: Clayburn Pert, MD;  Location: [facility] ORS;  Service: General;  Laterality: N/A;  . EXCISION MASS ABDOMINAL N/A 02/26/2015   Procedure: EXCISION MASS ABDOMINAL/wash out;  Surgeon: Clayburn Pert, MD;  Location: [facility] ORS;  Service: General;  Laterality: N/A;  . FLEXIBLE SIGMOIDOSCOPY  06/19/2015   Procedure: FLEXIBLE SIGMOIDOSCOPY;  Surgeon: Lucilla Lame, MD;  Location: [place];  Service: Endoscopy;;  UNABLE TO ACCESS OSTOMY SITE FOR ACCESS INTO COLON  . LAPAROSCOPY  07/12/2015   Procedure: LAPAROSCOPY DIAGNOSTIC;  Surgeon: Clayburn Pert, MD;  Location: [facility] ORS;  Service: General;;  . LAPAROTOMY  07/12/2015   Procedure: EXPLORATORY LAPAROTOMY;  Surgeon: Clayburn Pert, MD;  Location: [facility] ORS;  Service: General;;  . LAPAROTOMY N/A 02/06/2015   Procedure: Laparotomy, reduction of incarcerated parastomal hernia, repair of parastomal hernia with mesh;  Surgeon: Sherri Rad, MD;  Location: [facility] ORS;  Service: General;  Laterality: N/A;  . laparotomy closure of cecal perforation  05/09/2013   Dr. Marina Gravel  . LYSIS OF ADHESION  07/12/2015   Procedure: LYSIS OF  ADHESION;  Surgeon: Clayburn Pert, MD;  Location: [facility] ORS;  Service: General;;  . TONSILLECTOMY    . WOUND DEBRIDEMENT N/A 03/03/2015   Procedure: DEBRIDEMENT ABDOMINAL WOUND;  Surgeon: Florene Glen, MD;  Location: [facility] ORS;  Service: General;  Laterality: N/A;  . WOUND DEBRIDEMENT N/A 03/09/2015   Procedure: DEBRIDEMENT ABDOMINAL WOUND;  Surgeon: Florene Glen, MD;  Location: [facility] ORS;  Service: General;  Laterality: N/A;    Prior to Admission medications   Medication Sig Start Date End Date Taking? Authorizing Provider  amiodarone (PACERONE) 200 MG tablet Take 200 mg by mouth 2 (two) times daily. 06/29/18   [provider]  amLODipine (NORVASC) 5 MG tablet Take 1 tablet (5 mg total) by mouth daily. 07/22/15   Florene Glen, MD   apixaban (ELIQUIS) 5 MG TABS tablet Take 5 mg by mouth 2 (two) times daily.    [provider]  atorvastatin (LIPITOR) 40 MG tablet Take 40 mg by mouth daily.  04/19/15   [provider]  calcitRIOL (ROCALTROL) 0.25 MCG capsule Take 0.25 mcg by mouth daily.    [provider]  clobetasol cream (TEMOVATE) 1.44 % Apply 1 application topically every other day.  12/29/15   [provider]  DULoxetine (CYMBALTA) 30 MG capsule Take 30 mg by mouth daily.  01/18/15   [provider]  DUPIXENT 300 MG/2ML SOSY Inject 300 mg into the skin every 14 (fourteen) days. 05/28/18   [provider]  furosemide (LASIX) 20 MG tablet Take 20 mg by mouth daily. 06/24/18   [provider]  glucose blood (ONE TOUCH ULTRA TEST) test strip TEST TWICE DAILY 09/06/14   [provider]  HUMALOG KWIKPEN 100 UNIT/ML KwikPen Inject 22-38 Units into the skin See admin instructions. Inject 22 units subcutaneously in the morning & 38 units in the evening at supper 06/17/18   [provider]  hydrocortisone (ANUSOL-HC) 25 MG suppository Place 25 mg rectally 2 (two) times daily as needed for hemorrhoids. 06/19/18   [provider]  LANTUS SOLOSTAR 100 UNIT/ML Solostar Pen Inject 50 Units into the skin at bedtime. 06/13/18   [provider]  levothyroxine (SYNTHROID, LEVOTHROID) 88 MCG tablet Take 88 mcg by mouth daily before breakfast.  09/11/15   [provider]  metoprolol succinate (TOPROL-XL) 50 MG 24 hr tablet Take 50 mg by mouth 2 (two) times daily. 06/24/18   [provider]  ONETOUCH DELICA LANCETS 81E MISC TEST TWICE DAILY 09/06/14   [provider]  oxyCODONE (OXY IR/ROXICODONE) 5 MG immediate release tablet Take 1 tablet (5 mg total) by mouth every 8 (eight) hours as needed for up to 30 days for severe pain. Patient not taking: Reported on 07/07/2018 09/06/18 10/06/18  Vevelyn Francois, NP  oxyCODONE (OXY IR/ROXICODONE)  5 MG immediate release tablet Take 1 tablet (5 mg total) by mouth every 8 (eight) hours as needed for up to 30 days for severe pain. Patient not taking: Reported on 07/07/2018 08/07/18 09/06/18  Vevelyn Francois, NP  oxyCODONE (OXY IR/ROXICODONE) 5 MG immediate release tablet Take 1 tablet (5 mg total) by mouth every 8 (eight) hours as needed for up to 30 days for severe pain. 07/08/18 08/07/18  Vevelyn Francois, NP  Vitamin D, Ergocalciferol, (DRISDOL) 1.25 MG (50000 UT) CAPS capsule Take 50,000 Units by mouth every Tuesday.    [provider]    Allergies Buspirone; Citalopram; Lisinopril; Metformin; Pravastatin; Sitagliptin; Tramadol; Diltiazem hcl; Gabapentin; Hydralazine; and  Lovastatin  Family History  Problem Relation Age of Onset  . Diabetes Mother   . Hypertension Father   . CAD Father   . Heart attack Father   . Colon cancer Brother     Social History Social History   Tobacco Use  . Smoking status: Former Smoker    Types: Cigarettes    Last attempt to quit: 04/18/2013    Years since quitting: 5.2  . Smokeless tobacco: Never Used  Substance Use Topics  . Alcohol use: No    Alcohol/week: 0.0 standard drinks  . Drug use: No    Review of Systems Constitutional: No fever/chills Eyes: No visual changes. ENT: No sore throat. Cardiovascular: Denies chest pain. Respiratory: Positive for shortness of breath. Gastrointestinal: No abdominal pain.  No nausea, no vomiting.  No diarrhea.   Genitourinary: Negative for dysuria. Musculoskeletal: Negative for back pain. Skin: Negative for rash. Neurological: Negative for headaches, focal weakness or numbness.  ____________________________________________   PHYSICAL EXAM:  VITAL SIGNS: ED Triage Vitals [07/12/18 2146]  Enc Vitals Group     BP      Pulse Rate 89     Resp      Temp      Temp src      SpO2 (!) 88 %   Constitutional: Alert and oriented.  Eyes: Conjunctivae are normal.  ENT      Head: Normocephalic and  atraumatic.      Nose: No congestion/rhinnorhea.      Mouth/Throat: Mucous membranes are moist.      Neck: No stridor. Hematological/Lymphatic/Immunilogical: No cervical lymphadenopathy. Cardiovascular: Tachycardic, regular rhythm.  No murmurs, rubs, or gallops. Respiratory: Normal respiratory effort without tachypnea nor retractions. Breath sounds are clear and equal bilaterally. No wheezes/rales/rhonchi. Gastrointestinal: Soft and non tender. No rebound. No guarding.  Genitourinary: Deferred Musculoskeletal: Normal range of motion in all extremities. 1+ bilateral lower extremity edema.  Neurologic:  Normal speech and language. No gross focal neurologic deficits are appreciated.  Skin:  Skin is warm, dry and intact. No rash noted. Psychiatric: Mood and affect are normal. Speech and behavior are normal. Patient exhibits appropriate insight and judgment.  ____________________________________________    LABS (pertinent positives/negatives)  CBC wbc 11.2, hgb 12.0, plt 228 BMP na 139, k 4.5, glu 104, cr 3.39  ____________________________________________   EKG  I, Nance Pear, attending physician, personally viewed and interpreted this EKG  EKG Time: 2201 Rate: 104 Rhythm: atrial fibrillation Axis: normal Intervals: qtc 506 QRS: narrow, q waves v1, v2, v3 ST changes: no st elevation Impression: abnormal ekg   ____________________________________________    RADIOLOGY  None  ____________________________________________   PROCEDURES  Procedures  ____________________________________________   INITIAL IMPRESSION / ASSESSMENT AND PLAN / ED COURSE  Pertinent labs & imaging results that were available during my care of the patient were reviewed by me and considered in my medical decision making (see chart for details).   Patient presented to the emergency department today because of concerns for worsening shortness of breath.  Patient's work-up was notable for some  pulmonary edema.  She did have some lower extremity edema as well.  I am concerned that some CHF exacerbation is causing the patient's shortness of breath.  Additionally patient was found to have a slight acute kidney injury.  Because of these findings do think patient would benefit from hospitalization.  I discussed findings and plan with patient.   ____________________________________________   FINAL CLINICAL IMPRESSION(S) / ED DIAGNOSES  Final diagnoses:  Congestive heart  failure, unspecified HF chronicity, unspecified heart failure type ([place])  Hypoxia  SOB (shortness of breath)     Note: This dictation was prepared with Dragon dictation. Any transcriptional errors that result from this process are unintentional     Nance Pear, MD 07/12/18 2329

## 2018-07-12 NOTE — ED Triage Notes (Signed)
Pt states "always short of breath but worse today". Is scheduled for cardioversion on weds. Tested negative for covid on Friday (pretesting).

## 2018-07-12 NOTE — H&P (Signed)
[facility] [place] NAME: [name]    MR#:  [id number]  DATE OF BIRTH:  11-Jan-1951  DATE OF ADMISSION:  07/12/2018  PRIMARY CARE PHYSICIAN: Adin Hector, MD   REQUESTING/REFERRING PHYSICIAN: Nance Pear, MD  CHIEF COMPLAINT:   Chief Complaint  Patient presents with   Shortness of Breath    HISTORY OF PRESENT ILLNESS:  [name]  is a 68 y.o. female with a known history of hypertension, hyperlipidemia, hypothyroidism, type 2 diabetes, CKD III-IV, severe aortic stenosis s/p TAVR, persistent atrial fibrillation on Eliquis who presented to the ED with shortness of breath.  Patient states she has been short of breath over the last 4 weeks, but it got much worse today.  She denies any cough, fevers, chills.  She endorses some diaphoresis.  She denies any chest pain.  She endorses lower extremity edema that comes and goes.  She denies any orthopnea.  In the ED, she was mildly tachycardic with heart rates in the low 100s.  She became hypoxic to 88% on room air when she moved around.  Labs were significant for creatinine 3.39, WBC 11.2.  Chest x-ray showed mild CHF.  She was given Lasix and hospitalists were called for admission.  PAST MEDICAL HISTORY:   Past Medical History:  Diagnosis Date   Anemia    Anxiety    Aortic valve stenosis    Arthritis    feet, legs   B12 deficiency    Bowel obstruction (HCC)    CKD (chronic kidney disease)    protein in urine   Colostomy in place [facility])    Diabetes mellitus without complication (HCC)    Diverticulitis large intestine    Dysrhythmia    Ectopic atrial tachycardia (HCC)    FSGS (focal segmental glomerulosclerosis)    Gastritis    GI bleed    Heart murmur    Hyperlipidemia    Hypertension    Hypothyroidism    Hypothyroidism    unspecified   MRSA (methicillin resistant Staphylococcus aureus)    at abdominal wound.  Jan 2017.  Treated.    Neuropathy     Neuropathy in diabetes ([place])    Obesity    Pedal edema    Psoriasis    Shortness of breath dyspnea    Vertigo     PAST SURGICAL HISTORY:   Past Surgical History:  Procedure Laterality Date   ABDOMINAL HYSTERECTOMY     APPLICATION OF WOUND VAC N/A 02/24/2015   Procedure: APPLICATION OF WOUND VAC;  Surgeon: Clayburn Pert, MD;  Location: [facility] ORS;  Service: General;  Laterality: N/A;   APPLICATION OF WOUND VAC N/A 02/26/2015   Procedure: APPLICATION OF WOUND VAC;  Surgeon: Clayburn Pert, MD;  Location: [facility];  Service: General;  Laterality: N/A;   BACK SURGERY     spur frmoved from lower back   [place]  08/2017   [facility]   COLECTOMY     COLONOSCOPY WITH PROPOFOL N/A 09/04/2016   Procedure: COLONOSCOPY WITH PROPOFOL;  Surgeon: Manya Silvas, MD;  Location: [facility] ENDOSCOPY;  Service: Endoscopy;  Laterality: N/A;   COLOSTOMY REVERSAL N/A 07/12/2015   Procedure: COLOSTOMY REVERSAL;  Surgeon: Clayburn Pert, MD;  Location: [facility];  Service: General;  Laterality: N/A;   DEBRIDEMENT OF ABDOMINAL WALL ABSCESS N/A 02/22/2015   Procedure: DEBRIDEMENT OF ABDOMINAL WALL ABSCESS;  Surgeon: Clayburn Pert, MD;  Location: [facility];  Service: General;  Laterality: N/A;   EXCISION MASS ABDOMINAL N/A 02/24/2015   Procedure: EXCISION MASS ABDOMINAL  / [place];  Surgeon: Clayburn Pert, MD;  Location: [facility];  Service: General;  Laterality: N/A;   EXCISION MASS ABDOMINAL N/A 02/26/2015   Procedure: EXCISION MASS ABDOMINAL/wash out;  Surgeon: Clayburn Pert, MD;  Location: [facility];  Service: General;  Laterality: N/A;   FLEXIBLE SIGMOIDOSCOPY  06/19/2015   Procedure: FLEXIBLE SIGMOIDOSCOPY;  Surgeon: Lucilla Lame, MD;  Location: [facility];  Service: Endoscopy;;  UNABLE TO [place] INTO COLON   LAPAROSCOPY  07/12/2015   Procedure: LAPAROSCOPY DIAGNOSTIC;  Surgeon: Clayburn Pert, MD;  Location: [facility];   Service: General;;   LAPAROTOMY  07/12/2015   Procedure: EXPLORATORY LAPAROTOMY;  Surgeon: Clayburn Pert, MD;  Location: [facility];  Service: General;;   LAPAROTOMY N/A 02/06/2015   Procedure: Laparotomy, reduction of incarcerated parastomal hernia, repair of parastomal hernia with mesh;  Surgeon: Sherri Rad, MD;  Location: [facility];  Service: General;  Laterality: N/A;   laparotomy closure of cecal perforation  05/09/2013   Dr. Marina Gravel   LYSIS OF ADHESION  07/12/2015   Procedure: LYSIS OF ADHESION;  Surgeon: Clayburn Pert, MD;  Location: [facility];  Service: General;;   TONSILLECTOMY     WOUND DEBRIDEMENT N/A 03/03/2015   Procedure: DEBRIDEMENT ABDOMINAL WOUND;  Surgeon: Florene Glen, MD;  Location: [facility];  Service: General;  Laterality: N/A;   WOUND DEBRIDEMENT N/A 03/09/2015   Procedure: DEBRIDEMENT ABDOMINAL WOUND;  Surgeon: Florene Glen, MD;  Location: [facility];  Service: General;  Laterality: N/A;    SOCIAL HISTORY:   Social History   Tobacco Use   Smoking status: Former Smoker    Types: Cigarettes    Last attempt to quit: 04/18/2013    Years since quitting: 5.2   Smokeless tobacco: Never Used  Substance Use Topics   Alcohol use: No    Alcohol/week: 0.0 standard drinks    FAMILY HISTORY:   Family History  Problem Relation Age of Onset   Diabetes Mother    Hypertension Father    CAD Father    Heart attack Father    Colon cancer Brother     DRUG ALLERGIES:   Allergies  Allergen Reactions   Buspirone Other (See Comments)    Weakness   Citalopram Other (See Comments)   Lisinopril Cough   Metformin Diarrhea    At 1000 mg dose   Pravastatin Other (See Comments)    insomnia   Sitagliptin Other (See Comments)    constipation   Tramadol Itching   Diltiazem Hcl Palpitations   Gabapentin Palpitations   Hydralazine Rash   Lovastatin Palpitations    REVIEW OF SYSTEMS:   Review of Systems  Constitutional: Positive for diaphoresis.  Negative for chills and fever.  HENT: Negative for congestion and sore throat.   Eyes: Negative for blurred vision and double vision.  Respiratory: Positive for shortness of breath. Negative for cough.   Cardiovascular: Positive for leg swelling. Negative for chest pain and palpitations.  Gastrointestinal: Negative for nausea and vomiting.  Genitourinary: Negative for dysuria and urgency.  Musculoskeletal: Positive for back pain and neck pain.  Neurological: Negative for dizziness and headaches.  Psychiatric/Behavioral: Negative for depression. The patient is not nervous/anxious.     MEDICATIONS AT HOME:   Prior to Admission medications   Medication Sig Start Date End Date Taking? Authorizing Provider  glucose blood (ONE TOUCH ULTRA TEST) test  strip TEST TWICE DAILY 09/06/14  Yes [provider]  hydrocortisone (ANUSOL-HC) 25 MG suppository Place 25 mg rectally 2 (two) times daily as needed for hemorrhoids. 06/19/18  Yes [provider]  Jonetta Speak LANCETS 14G MISC TEST TWICE DAILY 09/06/14  Yes [provider]  oxyCODONE (OXY IR/ROXICODONE) 5 MG immediate release tablet Take 1 tablet (5 mg total) by mouth every 8 (eight) hours as needed for up to 30 days for severe pain. 07/08/18 08/07/18 Yes Vevelyn Francois, NP  Vitamin D, Ergocalciferol, (DRISDOL) 1.25 MG (50000 UT) CAPS capsule Take 50,000 Units by mouth every Tuesday.   Yes [provider]  amiodarone (PACERONE) 200 MG tablet Take 200 mg by mouth 2 (two) times daily. 06/29/18   [provider]  amLODipine (NORVASC) 5 MG tablet Take 1 tablet (5 mg total) by mouth daily. 07/22/15   Florene Glen, MD  apixaban (ELIQUIS) 5 MG TABS tablet Take 5 mg by mouth 2 (two) times daily.    [provider]  atorvastatin (LIPITOR) 40 MG tablet Take 40 mg by mouth daily.  04/19/15   [provider]  calcitRIOL (ROCALTROL) 0.25 MCG capsule Take 0.25 mcg by mouth daily.    [provider]  clobetasol cream (TEMOVATE) 8.18 % Apply 1 application topically every other day.  12/29/15   [provider]  DULoxetine (CYMBALTA) 30 MG capsule Take 30 mg by mouth daily.  01/18/15   [provider]  DUPIXENT 300 MG/2ML SOSY Inject 300 mg into the skin every 14 (fourteen) days. 05/28/18   [provider]  furosemide (LASIX) 20 MG tablet Take 20 mg by mouth daily. 06/24/18   [provider]  HUMALOG KWIKPEN 100 UNIT/ML KwikPen Inject 22-38 Units into the skin See admin instructions. Inject 22 units subcutaneously in the morning & 38 units in the evening at supper 06/17/18   [provider]  LANTUS SOLOSTAR 100 UNIT/ML Solostar Pen Inject 50 Units into the skin at bedtime. 06/13/18   [provider]  levothyroxine (SYNTHROID, LEVOTHROID) 88 MCG tablet Take 88 mcg by mouth daily before breakfast.  09/11/15   [provider]  metoprolol succinate (TOPROL-XL) 50 MG 24 hr tablet Take 50 mg by mouth 2 (two) times daily. 06/24/18   [provider]  oxyCODONE (OXY IR/ROXICODONE) 5 MG immediate release tablet Take 1 tablet (5 mg total) by mouth every 8 (eight) hours as needed for up to 30 days for severe pain. Patient not taking: Reported on 07/07/2018 09/06/18 10/06/18  Vevelyn Francois, NP  oxyCODONE (OXY IR/ROXICODONE) 5 MG immediate release tablet Take 1 tablet (5 mg total) by mouth every 8 (eight) hours as needed for up to 30 days for severe pain. Patient not taking: Reported on 07/07/2018 08/07/18 09/06/18  Vevelyn Francois, NP      VITAL SIGNS:  Blood pressure (!) 134/106, pulse (!) 104, temperature 98 F (36.7 C), temperature source Oral, resp. rate (!) 24, height 5\' 3"  (1.6 m), weight 102.1 kg, SpO2 91 %.  PHYSICAL EXAMINATION:  Physical Exam  GENERAL:  68 y.o.-year-old patient lying in the bed with no acute distress.  EYES: Pupils equal, round, reactive to light and accommodation. No scleral icterus. Extraocular muscles intact.    HEENT: Head atraumatic, normocephalic. Oropharynx and nasopharynx clear.  NECK:  Supple, no jugular venous distention. No thyroid enlargement, no tenderness.  LUNGS: Normal breath sounds bilaterally, no wheezing, rales,rhonchi or crepitation. No use of accessory muscles of respiration.  CARDIOVASCULAR: Irregularly irregular rhythm, tachycardic, S1, S2 normal. II/VI systolic murmur. ABDOMEN: Soft, nontender, nondistended. Bowel sounds present. No organomegaly or mass.  EXTREMITIES: No pedal edema, cyanosis, or clubbing.  NEUROLOGIC: Cranial nerves II through XII are intact. Muscle strength 5/5 in all extremities. Sensation intact. Gait not checked.  PSYCHIATRIC: The patient is alert and oriented x 3.  SKIN: No obvious rash, lesion, or ulcer.   LABORATORY PANEL:   CBC Recent Labs  Lab 07/12/18 2203  WBC 11.2*  HGB 12.0  HCT 40.2  PLT 228   ------------------------------------------------------------------------------------------------------------------  Chemistries  Recent Labs  Lab 07/12/18 2203  NA 139  K 4.5  CL 103  CO2 27  GLUCOSE 104*  BUN 46*  CREATININE 3.39*  CALCIUM 8.7*   ------------------------------------------------------------------------------------------------------------------  Cardiac Enzymes Recent Labs  Lab 07/12/18 2203  TROPONINI <0.03   ------------------------------------------------------------------------------------------------------------------  RADIOLOGY:  Dg Chest 2 View  Result Date: 07/12/2018 CLINICAL DATA:  Shortness of breath EXAM: CHEST - 2 VIEW COMPARISON:  07/07/2015 FINDINGS: Cardiac shadow is mildly prominent. Aortic calcifications are seen. Postsurgical changes noted on the left in the subclavicular region. Cervical spine surgical changes are noted as well. Increased vascular congestion is noted with mild interstitial edema consistent with CHF. 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 No bony abnormality is noted. IMPRESSION: Mild  CHF. Electronically Signed   By: Inez Catalina M.D.   On: 07/12/2018 22:41      IMPRESSION AND PLAN:   Acute hypoxic respiratory failure-secondary to acute on chronic systolic congestive heart failure.  Recent echo 5/6 with EF 25%. -Lasix 40mg  IV daily -Check BNP -Strict I/O -Daily weights -[facility] Cardiology consult -Ambulate with pulse ox prior to discharge  AKI in CKD III-IV- likely due to cardiorenal syndrome -Avoid nephrotoxic agents -Monitor creatinine -Renal US  Persistent atrial fibrillation- patient is supposed to undergo cardioversion on 5/27. -Cardiology consult to see if they would like to do cardioversion while patient is hospitalized -Continue home amiodarone, metoprolol, eliquis  Hypertension-blood pressures normal in the ED -Continue home Norvasc, metoprolol  Type 2 diabetes -Lantus and SSI  Hyperlipidemia-stable -Continue home Lipitor  Both thyroidism-stable -Continue home Synthroid  Chronic pain syndrome -Continue home oxycodone and cymbalta  All the records are reviewed and case discussed with ED provider. Management plans discussed with the patient, family and they are in agreement.  CODE STATUS: full  TOTAL TIME TAKING CARE OF THIS PATIENT: 45 minutes.    Berna Spare Amea Mcphail M.D on 07/12/2018 at 11:20 PM  Between 7am to 6pm - Pager - [phone]  After 6pm go to [email or web address] - password [email or web address] [facility]  [facility]  Office  [phone]  CC: Primary care physician; Adin Hector, MD   Note: This dictation was prepared with Dragon dictation along with smaller phrase technology. Any transcriptional errors that result from this process are unintentional.

## 2018-07-12 NOTE — Progress Notes (Signed)
Family Meeting Note  Advance Directive:no  Today a meeting took place with the Patient.  Patient is able to participate.  The following clinical team members were present during this meeting:MD  The following were discussed:Patient's diagnosis: systolic CHF exacerbation, Patient's progosis: Unable to determine and Goals for treatment: Full Code  Additional follow-up to be provided: prn  Time spent during discussion:20 minutes  Evette Doffing, MD

## 2018-07-12 NOTE — ED Notes (Signed)
ED TO INPATIENT HANDOFF REPORT  ED Nurse Name and Phone #: Ena Dawley RN #[phone]  S Name/Age/Gender [name] 68 y.o. female Room/Bed: ED11A/ED11A  Code Status   Code Status: Prior  Home/SNF/Other Home Patient oriented to: self, place, time and situation Is this baseline? Yes   Triage Complete: Triage complete  Chief Complaint SOB  Triage Note Pt states "always short of breath but worse today". Is scheduled for cardioversion on weds. Tested negative for covid on Friday (pretesting).    Allergies Allergies  Allergen Reactions  . Buspirone Other (See Comments)    Weakness  . Citalopram Other (See Comments)  . Lisinopril Cough  . Metformin Diarrhea    At 1000 mg dose  . Pravastatin Other (See Comments)    insomnia  . Sitagliptin Other (See Comments)    constipation  . Tramadol Itching  . Diltiazem Hcl Palpitations  . Gabapentin Palpitations  . Hydralazine Rash  . Lovastatin Palpitations    Level of Care/Admitting Diagnosis ED Disposition    ED Disposition Condition [facility] Area: [place] [[id number]]  Level of Care: Telemetry [5]  Covid Evaluation: N/A  Diagnosis: Acute respiratory failure with hypoxia [facility]) [[id number]]  Admitting Physician: Hyman Bible DODD [[id number]]  Attending Physician: Hyman Bible DODD [[id number]]  Estimated length of stay: past midnight tomorrow  Certification:: I certify this patient will need inpatient services for at least 2 midnights  PT Class (Do Not Modify): Inpatient [101]  PT Acc Code (Do Not Modify): Private [1]       B Medical/Surgery History Past Medical History:  Diagnosis Date  . Anemia   . Anxiety   . Aortic valve stenosis   . Arthritis    feet, legs  . B12 deficiency   . Bowel obstruction ([place])   . CKD (chronic kidney disease)    protein in urine  . Colostomy in place [facility])   . Diabetes mellitus without complication ([place])   . Diverticulitis large intestine   . Dysrhythmia   .  Ectopic atrial tachycardia ([place])   . FSGS (focal segmental glomerulosclerosis)   . Gastritis   . GI bleed   . Heart murmur   . Hyperlipidemia   . Hypertension   . Hypothyroidism   . Hypothyroidism    unspecified  . MRSA (methicillin resistant Staphylococcus aureus)    at abdominal wound.  Jan 2017.  Treated.   . Neuropathy   . Neuropathy in diabetes ([place])   . Obesity   . Pedal edema   . Psoriasis   . Shortness of breath dyspnea   . Vertigo    Past Surgical History:  Procedure Laterality Date  . ABDOMINAL HYSTERECTOMY    . APPLICATION OF WOUND VAC N/A 02/24/2015   Procedure: APPLICATION OF WOUND VAC;  Surgeon: Clayburn Pert, MD;  Location: [facility];  Service: General;  Laterality: N/A;  . APPLICATION OF WOUND VAC N/A 02/26/2015   Procedure: APPLICATION OF WOUND VAC;  Surgeon: Clayburn Pert, MD;  Location: [facility];  Service: General;  Laterality: N/A;  . BACK SURGERY     spur frmoved from lower back  . CARDIAC CATHETERIZATION    . CARDIAC VALVE REPLACEMENT  08/2017   [facility]  . COLECTOMY    . COLONOSCOPY WITH PROPOFOL N/A 09/04/2016   Procedure: COLONOSCOPY WITH PROPOFOL;  Surgeon: Manya Silvas, MD;  Location: [facility] ENDOSCOPY;  Service: Endoscopy;  Laterality: N/A;  . COLOSTOMY REVERSAL N/A 07/12/2015   Procedure: COLOSTOMY REVERSAL;  Surgeon: Clayburn Pert, MD;  Location: [facility];  Service: General;  Laterality: N/A;  . DEBRIDEMENT OF ABDOMINAL WALL ABSCESS N/A 02/22/2015   Procedure: DEBRIDEMENT OF ABDOMINAL WALL ABSCESS;  Surgeon: Clayburn Pert, MD;  Location: [facility];  Service: General;  Laterality: N/A;  . EXCISION MASS ABDOMINAL N/A 02/24/2015   Procedure: EXCISION MASS ABDOMINAL  / [place];  Surgeon: Clayburn Pert, MD;  Location: [facility];  Service: General;  Laterality: N/A;  . EXCISION MASS ABDOMINAL N/A 02/26/2015   Procedure: EXCISION MASS ABDOMINAL/wash out;  Surgeon: Clayburn Pert, MD;  Location: [facility];  Service: General;  Laterality: N/A;  . FLEXIBLE  SIGMOIDOSCOPY  06/19/2015   Procedure: FLEXIBLE SIGMOIDOSCOPY;  Surgeon: Lucilla Lame, MD;  Location: [place];  Service: Endoscopy;;  UNABLE TO ACCESS OSTOMY SITE FOR ACCESS INTO COLON  . LAPAROSCOPY  07/12/2015   Procedure: LAPAROSCOPY DIAGNOSTIC;  Surgeon: Clayburn Pert, MD;  Location: [facility];  Service: General;;  . LAPAROTOMY  07/12/2015   Procedure: EXPLORATORY LAPAROTOMY;  Surgeon: Clayburn Pert, MD;  Location: [facility];  Service: General;;  . LAPAROTOMY N/A 02/06/2015   Procedure: Laparotomy, reduction of incarcerated parastomal hernia, repair of parastomal hernia with mesh;  Surgeon: Sherri Rad, MD;  Location: [facility];  Service: General;  Laterality: N/A;  . laparotomy closure of cecal perforation  05/09/2013   Dr. Marina Gravel  . LYSIS OF ADHESION  07/12/2015   Procedure: LYSIS OF ADHESION;  Surgeon: Clayburn Pert, MD;  Location: [facility];  Service: General;;  . TONSILLECTOMY    . WOUND DEBRIDEMENT N/A 03/03/2015   Procedure: DEBRIDEMENT ABDOMINAL WOUND;  Surgeon: Florene Glen, MD;  Location: [facility];  Service: General;  Laterality: N/A;  . WOUND DEBRIDEMENT N/A 03/09/2015   Procedure: DEBRIDEMENT ABDOMINAL WOUND;  Surgeon: Florene Glen, MD;  Location: [facility];  Service: General;  Laterality: N/A;     A IV Location/Drains/Wounds Patient Lines/Drains/Airways Status   Active Line/Drains/Airways    Name:   Placement date:   Placement time:   Site:   Days:   Peripheral IV 07/12/18 Left Antecubital   07/12/18    2217    Antecubital   less than 1   Incision (Closed) Abdomen Lateral;Mid   -    -        Incision (Closed) 07/12/15 Abdomen   07/12/15    1112     1096          Intake/Output Last 24 hours No intake or output data in the 24 hours ending 07/12/18 2354  Labs/Imaging Results for orders placed or performed during the hospital encounter of 07/12/18 (from the past 48 hour(s))  Basic metabolic panel     Status: Abnormal   Collection Time: 07/12/18 10:03 PM   Result Value Ref Range   Sodium 139 135 - 145 mmol/L   Potassium 4.5 3.5 - 5.1 mmol/L   Chloride 103 98 - 111 mmol/L   CO2 27 22 - 32 mmol/L   Glucose, Bld 104 (H) 70 - 99 mg/dL   BUN 46 (H) 8 - 23 mg/dL   Creatinine, Ser 3.39 (H) 0.44 - 1.00 mg/dL   Calcium 8.7 (L) 8.9 - 10.3 mg/dL   GFR calc non Af Amer 13 (L) >60 mL/min   GFR calc Af Amer 15 (L) >60 mL/min   Anion gap 9 5 - 15    Comment: Performed at [facility], [address] [place], [place] [address]  CBC     Status:  Abnormal   Collection Time: 07/12/18 10:03 PM  Result Value Ref Range   WBC 11.2 (H) 4.0 - 10.5 K/uL   RBC 4.62 3.87 - 5.11 MIL/uL   Hemoglobin 12.0 12.0 - 15.0 g/dL   HCT 40.2 36.0 - 46.0 %   MCV 87.0 80.0 - 100.0 fL   MCH 26.0 26.0 - 34.0 pg   MCHC 29.9 (L) 30.0 - 36.0 g/dL   RDW 16.0 (H) 11.5 - 15.5 %   Platelets 228 150 - 400 K/uL   nRBC 0.0 0.0 - 0.2 %    Comment: Performed at [facility], [address] [place], [place] [address]  Troponin I - Add-On to previous collection     Status: None   Collection Time: 07/12/18 10:03 PM  Result Value Ref Range   Troponin I <0.03 <0.03 ng/mL    Comment: Performed at [facility], [address] [place], [place] [address]   Dg Chest 2 View  Result Date: 07/12/2018 CLINICAL DATA:  Shortness of breath EXAM: CHEST - 2 VIEW COMPARISON:  07/07/2015 FINDINGS: Cardiac shadow is mildly prominent. Aortic calcifications are seen. Postsurgical changes noted on the left in the subclavicular region. Cervical spine surgical changes are noted as well. Increased vascular congestion is noted with mild interstitial edema consistent with CHF. 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 No bony abnormality is noted. IMPRESSION: Mild CHF. Electronically Signed   By: Inez Catalina M.D.   On: 07/12/2018 22:41    Pending Labs [employer] (From admission, onward)    Start     Ordered   Signed and Held  HIV antibody (Routine Testing)  Once,   R     Signed  and Held   Signed and Held  Basic metabolic panel  Daily,   R     Signed and Held   Signed and Held  Brain natriuretic peptide  Add-on,   R     Signed and Held          Vitals/Pain Today's Vitals   07/12/18 2200 07/12/18 2206 07/12/18 2245 07/12/18 2253  BP:      Pulse: (!) 116  (!) 107 (!) 104  Resp: 20  (!) 21 (!) 24  Temp:      TempSrc:      SpO2: 92%  90% 91%  Weight:  102.1 kg    Height:  5\' 3"  (1.6 m)    PainSc:  0-No pain      Isolation Precautions No active isolations  Medications Medications  furosemide (LASIX) injection 40 mg (has no administration in time range)    Mobility walks Low fall risk   Focused Assessments Cardiac Assessment Handoff:    Lab Results  Component Value Date   TROPONINI <0.03 07/12/2018   No results found for: DDIMER Does the Patient currently have chest pain? No     R Recommendations: See Admitting Provider Note  Report given to:   Additional Notes:

## 2018-07-12 NOTE — ED Notes (Signed)
Pt brought to ED by daughter with c/o shortness of breath; denies any other symptoms; no exposure to Covid but pt was tested Friday; results in computer are negative; sats 88% at stat registration; pt to go to treatment room directly via wheelchair

## 2018-07-13 ENCOUNTER — Other Ambulatory Visit: Payer: Self-pay

## 2018-07-13 ENCOUNTER — Inpatient Hospital Stay: Payer: Medicare Other

## 2018-07-13 LAB — GLUCOSE, CAPILLARY
Glucose-Capillary: 175 mg/dL — ABNORMAL HIGH (ref 70–99)
Glucose-Capillary: 215 mg/dL — ABNORMAL HIGH (ref 70–99)
Glucose-Capillary: 161 mg/dL — ABNORMAL HIGH (ref 70–99)
Glucose-Capillary: 236 mg/dL — ABNORMAL HIGH (ref 70–99)

## 2018-07-13 LAB — BRAIN NATRIURETIC PEPTIDE: B Natriuretic Peptide: 307 pg/mL — ABNORMAL HIGH (ref 0.0–100.0)

## 2018-07-13 LAB — BASIC METABOLIC PANEL
Anion gap: 9 (ref 5–15)
BUN: 47 mg/dL — ABNORMAL HIGH (ref 8–23)
CO2: 27 mmol/L (ref 22–32)
Calcium: 8.7 mg/dL — ABNORMAL LOW (ref 8.9–10.3)
Chloride: 103 mmol/L (ref 98–111)
Creatinine, Ser: 3.42 mg/dL — ABNORMAL HIGH (ref 0.44–1.00)
GFR calc Af Amer: 15 mL/min — ABNORMAL LOW (ref >60)
GFR calc non Af Amer: 13 mL/min — ABNORMAL LOW (ref >60)
Glucose, Bld: 211 mg/dL — ABNORMAL HIGH (ref 70–99)
Potassium: 4.5 mmol/L (ref 3.5–5.1)
Sodium: 139 mmol/L (ref 135–145)

## 2018-07-13 LAB — MRSA PCR SCREENING: MRSA by PCR: POSITIVE — AB

## 2018-07-13 MED ORDER — APIXABAN 5 MG PO TABS
5.0000 mg | ORAL_TABLET | Freq: Two times a day (BID) | ORAL | Status: DC
  Administered 2018-07-13 – 2018-07-15 (×5): 5 mg via ORAL
  Filled 2018-07-13 (×5): qty 1

## 2018-07-13 MED ORDER — SODIUM CHLORIDE 0.9% FLUSH: 3.0000 mL | INTRAVENOUS | Status: DC | PRN

## 2018-07-13 MED ORDER — LEVOTHYROXINE SODIUM 88 MCG PO TABS
88.0000 ug | ORAL_TABLET | Freq: Every day | ORAL | Status: DC
  Administered 2018-07-13 – 2018-07-15 (×3): 88 ug via ORAL
  Filled 2018-07-13 (×3): qty 1

## 2018-07-13 MED ORDER — CALCITRIOL 0.25 MCG PO CAPS
0.2500 ug | ORAL_CAPSULE | Freq: Every day | ORAL | Status: DC
  Administered 2018-07-13 – 2018-07-15 (×3): 0.25 ug via ORAL
  Filled 2018-07-13 (×3): qty 1

## 2018-07-13 MED ORDER — AMIODARONE HCL 200 MG PO TABS
200.0000 mg | ORAL_TABLET | Freq: Two times a day (BID) | ORAL | Status: DC
  Administered 2018-07-13 – 2018-07-15 (×5): 200 mg via ORAL
  Filled 2018-07-13 (×5): qty 1

## 2018-07-13 MED ORDER — INSULIN GLARGINE 100 UNIT/ML ~~LOC~~ SOLN
25.0000 [IU] | Freq: Every day | SUBCUTANEOUS | Status: DC
  Administered 2018-07-13 – 2018-07-14 (×2): 25 [IU] via SUBCUTANEOUS
  Filled 2018-07-13 (×3): qty 0.25

## 2018-07-13 MED ORDER — INSULIN ASPART 100 UNIT/ML ~~LOC~~ SOLN
0.0000 [IU] | Freq: Three times a day (TID) | SUBCUTANEOUS | Status: DC
  Administered 2018-07-13: 17:00:00 3 [IU] via SUBCUTANEOUS
  Administered 2018-07-13: 5 [IU] via SUBCUTANEOUS
  Administered 2018-07-13 – 2018-07-14 (×2): 3 [IU] via SUBCUTANEOUS
  Administered 2018-07-14: 2 [IU] via SUBCUTANEOUS
  Administered 2018-07-14: 5 [IU] via SUBCUTANEOUS
  Filled 2018-07-13 (×6): qty 1

## 2018-07-13 MED ORDER — ONDANSETRON HCL 4 MG/2ML IJ SOLN: 4.0000 mg | Freq: Four times a day (QID) | INTRAMUSCULAR | Status: DC | PRN

## 2018-07-13 MED ORDER — METOPROLOL SUCCINATE ER 50 MG PO TB24
50.0000 mg | ORAL_TABLET | Freq: Two times a day (BID) | ORAL | Status: DC
  Administered 2018-07-13 – 2018-07-15 (×5): 50 mg via ORAL
  Filled 2018-07-13 (×5): qty 1

## 2018-07-13 MED ORDER — ACETAMINOPHEN 325 MG PO TABS: 650.0000 mg | ORAL_TABLET | ORAL | Status: DC | PRN

## 2018-07-13 MED ORDER — ATORVASTATIN CALCIUM 20 MG PO TABS
40.0000 mg | ORAL_TABLET | Freq: Every day | ORAL | Status: DC
  Administered 2018-07-13 – 2018-07-14 (×2): 40 mg via ORAL
  Filled 2018-07-13 (×2): qty 2

## 2018-07-13 MED ORDER — DULOXETINE HCL 30 MG PO CPEP
30.0000 mg | ORAL_CAPSULE | Freq: Every day | ORAL | Status: DC
  Administered 2018-07-13 – 2018-07-15 (×3): 30 mg via ORAL
  Filled 2018-07-13 (×3): qty 1

## 2018-07-13 MED ORDER — AMLODIPINE BESYLATE 5 MG PO TABS
5.0000 mg | ORAL_TABLET | Freq: Every day | ORAL | Status: DC
  Administered 2018-07-13 – 2018-07-15 (×3): 5 mg via ORAL
  Filled 2018-07-13 (×3): qty 1

## 2018-07-13 MED ORDER — SODIUM CHLORIDE 0.9% FLUSH
3.0000 mL | Freq: Two times a day (BID) | INTRAVENOUS | Status: DC
  Administered 2018-07-13 – 2018-07-14 (×4): 3 mL via INTRAVENOUS

## 2018-07-13 MED ORDER — INSULIN ASPART 100 UNIT/ML ~~LOC~~ SOLN
0.0000 [IU] | Freq: Every day | SUBCUTANEOUS | Status: DC
  Administered 2018-07-13 – 2018-07-14 (×2): 2 [IU] via SUBCUTANEOUS
  Filled 2018-07-13 (×2): qty 1

## 2018-07-13 MED ORDER — OXYCODONE HCL 5 MG PO TABS
5.0000 mg | ORAL_TABLET | Freq: Three times a day (TID) | ORAL | Status: DC | PRN
  Administered 2018-07-13 – 2018-07-14 (×4): 5 mg via ORAL
  Filled 2018-07-13 (×4): qty 1

## 2018-07-13 MED ORDER — MUPIROCIN 2 % EX OINT
1.0000 "application " | TOPICAL_OINTMENT | Freq: Two times a day (BID) | CUTANEOUS | Status: DC
  Administered 2018-07-13 – 2018-07-15 (×5): 1 via NASAL
  Filled 2018-07-13: qty 22

## 2018-07-13 MED ORDER — SODIUM CHLORIDE 0.9 % IV SOLN: 250.0000 mL | INTRAVENOUS | Status: DC | PRN

## 2018-07-13 MED ORDER — FUROSEMIDE 10 MG/ML IJ SOLN
40.0000 mg | Freq: Every day | INTRAMUSCULAR | Status: DC
  Administered 2018-07-13: 40 mg via INTRAVENOUS
  Filled 2018-07-13: qty 4

## 2018-07-13 MED ORDER — CHLORHEXIDINE GLUCONATE CLOTH 2 % EX PADS
6.0000 | MEDICATED_PAD | Freq: Every day | CUTANEOUS | Status: DC
  Administered 2018-07-14 – 2018-07-15 (×2): 6 via TOPICAL

## 2018-07-13 NOTE — Progress Notes (Signed)
[facility] NAME: [name]    MR#:  [id number]  DATE OF BIRTH:  Dec 14, 1950  SUBJECTIVE:  CHIEF COMPLAINT:   Chief Complaint  Patient presents with  . Shortness of Breath   Patient feels a little better today.  Off oxygen.  Afebrile.  REVIEW OF SYSTEMS:    Review of Systems  Constitutional: Positive for malaise/fatigue. Negative for chills and fever.  HENT: Negative for sore throat.   Eyes: Negative for blurred vision, double vision and pain.  Respiratory: Positive for shortness of breath. Negative for cough, hemoptysis and wheezing.   Cardiovascular: Negative for chest pain, palpitations, orthopnea and leg swelling.  Gastrointestinal: Negative for abdominal pain, constipation, diarrhea, heartburn, nausea and vomiting.  Genitourinary: Negative for dysuria and hematuria.  Musculoskeletal: Negative for back pain and joint pain.  Skin: Negative for rash.  Neurological: Negative for sensory change, speech change, focal weakness and headaches.  Endo/Heme/Allergies: Does not bruise/bleed easily.  Psychiatric/Behavioral: Negative for depression. The patient is not nervous/anxious.     DRUG ALLERGIES:   Allergies  Allergen Reactions  . Buspirone Other (See Comments)    Weakness  . Citalopram Other (See Comments)  . Lisinopril Cough  . Metformin Diarrhea    At 1000 mg dose  . Pravastatin Other (See Comments)    insomnia  . Sitagliptin Other (See Comments)    constipation  . Tramadol Itching  . Diltiazem Hcl Palpitations  . Gabapentin Palpitations  . Hydralazine Rash  . Lovastatin Palpitations    VITALS:  Blood pressure 111/78, pulse 89, temperature 98.4 F (36.9 C), temperature source Oral, resp. rate 19, height 5\' 3"  (1.6 m), weight 106.8 kg, SpO2 92 %.  PHYSICAL EXAMINATION:   Physical Exam  GENERAL:  68 y.o.-year-old patient lying in the bed with no acute distress.  Obese EYES: Pupils equal, round, reactive to  light and accommodation. No scleral icterus. Extraocular muscles intact.  HEENT: Head atraumatic, normocephalic. Oropharynx and nasopharynx clear.  NECK:  Supple, no jugular venous distention. No thyroid enlargement, no tenderness.  LUNGS: Normal breath sounds bilaterally, no wheezing, rales, rhonchi. No use of accessory muscles of respiration.  CARDIOVASCULAR: S1, S2 normal. No murmurs, rubs, or gallops.  ABDOMEN: Soft, nontender, nondistended. Bowel sounds present. No organomegaly or mass.  EXTREMITIES: No cyanosis, clubbing or edema b/l.    NEUROLOGIC: Cranial nerves II through XII are intact. No focal Motor or sensory deficits b/l.   PSYCHIATRIC: The patient is alert and oriented x 3.  SKIN: No obvious rash, lesion, or ulcer.   LABORATORY PANEL:   CBC Recent Labs  Lab 07/12/18 2203  WBC 11.2*  HGB 12.0  HCT 40.2  PLT 228   ------------------------------------------------------------------------------------------------------------------ Chemistries  Recent Labs  Lab 07/13/18 0159  NA 139  K 4.5  CL 103  CO2 27  GLUCOSE 211*  BUN 47*  CREATININE 3.42*  CALCIUM 8.7*   ------------------------------------------------------------------------------------------------------------------  Cardiac Enzymes Recent Labs  Lab 07/12/18 2203  TROPONINI <0.03   ------------------------------------------------------------------------------------------------------------------  RADIOLOGY:  Dg Chest 2 View  Result Date: 07/12/2018 CLINICAL DATA:  Shortness of breath EXAM: CHEST - 2 VIEW COMPARISON:  07/07/2015 FINDINGS: Cardiac shadow is mildly prominent. Aortic calcifications are seen. Postsurgical changes noted on the left in the subclavicular region. Cervical spine surgical changes are noted as well. Increased vascular congestion is noted with mild interstitial edema consistent with CHF. 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 No bony abnormality is noted. IMPRESSION: Mild CHF.  Electronically  Signed   By: Inez Catalina M.D.   On: 07/12/2018 22:41     ASSESSMENT AND PLAN:   Acute hypoxic respiratory failure-secondary to acute on chronic systolic congestive heart failure.  Recent echo 5/6 with EF 25%. -on Lasix 40mg  IV daily -Strict I/O -Daily weights -[facility] Cardiology consulted.  Discussed with Dr. Stephani Police -Off oxygen today.  Hold Lasix due to worsening creatinine.  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  Repeat labs in the morning. History of FSGS.  Consult nephrology if no improvement.  Persistent atrial fibrillation- patient is supposed to undergo cardioversion on 5/27. -Cardiology consuled  Hypertension-blood pressures normal in the ED -Continue home Norvasc, metoprolol  Type 2 diabetes -Lantus and SSI  Hyperlipidemia-stable -Continue home Lipitor  Hypothyroidism-stable -Continue home Synthroid  Chronic pain syndrome -Continue home oxycodone and cymbalta  All the records are reviewed and case discussed with Care Management/Social Worker Management plans discussed with the patient, family and they are in agreement.  CODE STATUS: FULL CODE  DVT Prophylaxis: SCDs  TOTAL TIME TAKING CARE OF THIS PATIENT: 35 minutes.   POSSIBLE D/C IN 1-2 DAYS, DEPENDING ON CLINICAL CONDITION.  [name] M.D on 07/13/2018 at 1:28 PM  Between 7am to 6pm - Pager - [phone]  After 6pm go to [email or web address] - password [email or web address] [facility]  [facility]  Office  [phone]  CC: Primary care physician; Adin Hector, MD  Note: This dictation was prepared with Dragon dictation along with smaller phrase technology. Any transcriptional errors that result from this process are unintentional.

## 2018-07-13 NOTE — Consult Note (Signed)
[facility] Cardiology  CARDIOLOGY CONSULT NOTE  Patient ID: [name] MRN: [id number] DOB/AGE: Aug 27, 1950 68 y.o.  Admit date: 07/12/2018 Referring Physician [place] Primary Physician Gaynelle Cage Cardiologist Nehemiah Massed Reason for Consultation congestive heart failure  HPI: 68 year old female referred for evaluation of congestive heart failure.  Patient presents with 1 month history of feeling worse.  She does have shortness of breath.  She present [facility] emergency room where she was noted to be hypoxic with oxygen saturation of 88% on room air.  Chest x-ray revealed mild CHF.  Patient treated with intravenous furosemide with diuresis and modest clinical improvement.  Denies peripheral edema.  She denies chest pain.  She has occasional palpitations.  [place] labs notable for worsening BUN and creatinine of 47 and 3.42, respectively.  Patient has a history of focal segmental glomerulosclerosis.  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  Patient has persistent atrial fibrillation, on low-dose Eliquis for stroke prevention, scheduled for elective cardioversion on 07/15/2018.  Review of systems complete and found to be negative unless listed above     Past Medical History:  Diagnosis Date  . Anemia   . Anxiety   . Aortic valve stenosis   . Arthritis    feet, legs  . B12 deficiency   . Bowel obstruction ([place])   . CKD (chronic kidney disease)    protein in urine  . Colostomy in place [facility]   . Diabetes mellitus without complication ([place])   . Diverticulitis large intestine   . Dysrhythmia   . 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 FSGS (focal segmental glomerulosclerosis)   . Gastritis   . GI bleed   . Heart murmur   . Hyperlipidemia   . Hypertension   . Hypothyroidism   . Hypothyroidism    unspecified  . MRSA (methicillin resistant  Staphylococcus aureus)    at abdominal wound.  Jan 2017.  Treated.   . Neuropathy   . Neuropathy in diabetes ([place])   . Obesity   . Pedal edema   . Psoriasis   . Shortness of breath dyspnea   . Vertigo     Past Surgical History:  Procedure Laterality Date  . ABDOMINAL HYSTERECTOMY    . APPLICATION OF WOUND VAC N/A 02/24/2015   Procedure: APPLICATION OF WOUND VAC;  Surgeon: Clayburn Pert, MD;  Location: [facility] ORS;  Service: General;  Laterality: N/A;  . APPLICATION OF WOUND VAC N/A 02/26/2015   Procedure: APPLICATION OF WOUND VAC;  Surgeon: Clayburn Pert, MD;  Location: [facility] ORS;  Service: General;  Laterality: N/A;  . BACK SURGERY     spur frmoved from lower back  . CARDIAC CATHETERIZATION    . CARDIAC VALVE REPLACEMENT  08/2017   [facility]  . COLECTOMY    . COLONOSCOPY WITH PROPOFOL N/A 09/04/2016   Procedure: COLONOSCOPY WITH PROPOFOL;  Surgeon: Manya Silvas, MD;  Location: [facility] ENDOSCOPY;  Service: Endoscopy;  Laterality: N/A;  . COLOSTOMY REVERSAL N/A 07/12/2015   Procedure: COLOSTOMY REVERSAL;  Surgeon: Clayburn Pert, MD;  Location: [facility] ORS;  Service: General;  Laterality: N/A;  . DEBRIDEMENT OF ABDOMINAL WALL ABSCESS N/A 02/22/2015   Procedure: DEBRIDEMENT OF ABDOMINAL WALL ABSCESS;  Surgeon: Clayburn Pert, MD;  Location: [facility] ORS;  Service: General;  Laterality: N/A;  . EXCISION MASS ABDOMINAL N/A 02/24/2015   Procedure: EXCISION MASS ABDOMINAL  / [place];  Surgeon:  Clayburn Pert, MD;  Location: [facility] ORS;  Service: General;  Laterality: N/A;  . EXCISION MASS ABDOMINAL N/A 02/26/2015   Procedure: EXCISION MASS ABDOMINAL/wash out;  Surgeon: Clayburn Pert, MD;  Location: [facility] ORS;  Service: General;  Laterality: N/A;  . FLEXIBLE SIGMOIDOSCOPY  06/19/2015   Procedure: FLEXIBLE SIGMOIDOSCOPY;  Surgeon: Lucilla Lame, MD;  Location: [facility];  Service: Endoscopy;;  UNABLE TO ACCESS OSTOMY SITE FOR ACCESS INTO COLON  . LAPAROSCOPY  07/12/2015   Procedure: LAPAROSCOPY  DIAGNOSTIC;  Surgeon: Clayburn Pert, MD;  Location: [facility] ORS;  Service: General;;  . LAPAROTOMY  07/12/2015   Procedure: EXPLORATORY LAPAROTOMY;  Surgeon: Clayburn Pert, MD;  Location: [facility] ORS;  Service: General;;  . LAPAROTOMY N/A 02/06/2015   Procedure: Laparotomy, reduction of incarcerated parastomal hernia, repair of parastomal hernia with mesh;  Surgeon: Sherri Rad, MD;  Location: [facility] ORS;  Service: General;  Laterality: N/A;  . laparotomy closure of cecal perforation  05/09/2013   Dr. Marina Gravel  . LYSIS OF ADHESION  07/12/2015   Procedure: LYSIS OF ADHESION;  Surgeon: Clayburn Pert, MD;  Location: [facility] ORS;  Service: General;;  . TONSILLECTOMY    . WOUND DEBRIDEMENT N/A 03/03/2015   Procedure: DEBRIDEMENT ABDOMINAL WOUND;  Surgeon: Florene Glen, MD;  Location: [facility] ORS;  Service: General;  Laterality: N/A;  . WOUND DEBRIDEMENT N/A 03/09/2015   Procedure: DEBRIDEMENT ABDOMINAL WOUND;  Surgeon: Florene Glen, MD;  Location: [facility] ORS;  Service: General;  Laterality: N/A;    Medications Prior to Admission  Medication Sig Dispense Refill Last Dose  . amiodarone (PACERONE) 200 MG tablet Take 200 mg by mouth 2 (two) times daily.   07/12/2018 at Unknown time  . amLODipine (NORVASC) 5 MG tablet Take 1 tablet (5 mg total) by mouth daily. 30 tablet 1 07/12/2018 at Unknown time  . apixaban (ELIQUIS) 5 MG TABS tablet Take 5 mg by mouth 2 (two) times daily.   07/12/2018 at Unknown time  . atorvastatin (LIPITOR) 40 MG tablet Take 40 mg by mouth daily.    07/12/2018 at Unknown time  . calcitRIOL (ROCALTROL) 0.25 MCG capsule Take 0.25 mcg by mouth daily.   07/12/2018 at Unknown time  . DULoxetine (CYMBALTA) 30 MG capsule Take 30 mg by mouth daily.    07/12/2018 at Unknown time  . DUPIXENT 300 MG/2ML SOSY Inject 300 mg into the skin every 14 (fourteen) days.   Past Month at Unknown time  . furosemide (LASIX) 20 MG tablet Take 20 mg by mouth daily.   07/12/2018 at Unknown time  . glucose blood (ONE TOUCH  ULTRA TEST) test strip TEST TWICE DAILY   07/12/2018 at Unknown time  . HUMALOG KWIKPEN 100 UNIT/ML KwikPen Inject 22-38 Units into the skin See admin instructions. Inject 22 units subcutaneously in the morning & 38 units in the evening at supper   07/12/2018 at Unknown time  . hydrocortisone (ANUSOL-HC) 25 MG suppository Place 25 mg rectally 2 (two) times daily as needed for hemorrhoids.   prn at prn  . LANTUS SOLOSTAR 100 UNIT/ML Solostar Pen Inject 50 Units into the skin at bedtime.   07/12/2018 at Unknown time  . levothyroxine (SYNTHROID, LEVOTHROID) 88 MCG tablet Take 88 mcg by mouth daily before breakfast.    07/12/2018 at Unknown time  . metoprolol succinate (TOPROL-XL) 50 MG 24 hr tablet Take 50 mg by mouth 2 (two) times daily.   07/12/2018 at Unknown time  . ONETOUCH DELICA LANCETS 35K MISC TEST TWICE DAILY  07/12/2018 at Unknown time  . oxyCODONE (OXY IR/ROXICODONE) 5 MG immediate release tablet Take 1 tablet (5 mg total) by mouth every 8 (eight) hours as needed for up to 30 days for severe pain. 90 tablet 0 07/12/2018 at prn  . Vitamin D, Ergocalciferol, (DRISDOL) 1.25 MG (50000 UT) CAPS capsule Take 50,000 Units by mouth every Tuesday.   Past Week at Unknown time  . clobetasol cream (TEMOVATE) 3.08 % Apply 1 application topically every other day.    Not Taking at Unknown time  . [START ON 09/06/2018] oxyCODONE (OXY IR/ROXICODONE) 5 MG immediate release tablet Take 1 tablet (5 mg total) by mouth every 8 (eight) hours as needed for up to 30 days for severe pain. (Patient not taking: Reported on 07/07/2018) 90 tablet 0 Not Taking at Unknown time  . [START ON 08/07/2018] oxyCODONE (OXY IR/ROXICODONE) 5 MG immediate release tablet Take 1 tablet (5 mg total) by mouth every 8 (eight) hours as needed for up to 30 days for severe pain. (Patient not taking: Reported on 07/07/2018) 90 tablet 0 Not Taking at Unknown time   Social History   Socioeconomic History  . Marital status: Divorced    Spouse name: Not  on file  . Number of children: Not on file  . Years of education: Not on file  . Highest education level: Not on file  Occupational History  . Not on file  Social Needs  . Financial resource strain: Not on file  . Food insecurity:    Worry: Not on file    Inability: Not on file  . Transportation needs:    Medical: Not on file    Non-medical: Not on file  Tobacco Use  . Smoking status: Former Smoker    Types: Cigarettes    Last attempt to quit: 04/18/2013    Years since quitting: 5.2  . Smokeless tobacco: Never Used  Substance and Sexual Activity  . Alcohol use: No    Alcohol/week: 0.0 standard drinks  . Drug use: No  . Sexual activity: Not on file  Lifestyle  . Physical activity:    Days per week: Not on file    Minutes per session: Not on file  . Stress: Not on file  Relationships  . Social connections:    Talks on phone: Not on file    Gets together: Not on file    Attends religious service: Not on file    Active member of club or organization: Not on file    Attends meetings of clubs or organizations: Not on file    Relationship status: Not on file  . Intimate partner violence:    Fear of current or ex partner: Not on file    Emotionally abused: Not on file    Physically abused: Not on file    Forced sexual activity: Not on file  Other Topics Concern  . Not on file  Social History Narrative  . Not on file    Family History  Problem Relation Age of Onset  . Diabetes Mother   . Hypertension Father   . CAD Father   . Heart attack Father   . Colon cancer Brother       Review of systems complete and found to be negative unless listed above      PHYSICAL EXAM  General: Well developed, well nourished, in no acute distress HEENT:  Normocephalic and atramatic Neck:  No JVD.  Lungs: Clear bilaterally to auscultation and percussion. Heart: HRRR . Normal  S1 and S2 without gallops or murmurs.  Abdomen: Bowel sounds are positive, abdomen soft and non-tender   Msk:  Back normal, normal gait. Normal strength and tone for age. Extremities: No clubbing, cyanosis or edema.   Neuro: Alert and oriented X 3. Psych:  Good affect, responds appropriately  Labs:   Lab Results  Component Value Date   WBC 11.2 (H) 07/12/2018   HGB 12.0 07/12/2018   HCT 40.2 07/12/2018   MCV 87.0 07/12/2018   PLT 228 07/12/2018    Recent Labs  Lab 07/13/18 0159  NA 139  K 4.5  CL 103  CO2 27  BUN 47*  CREATININE 3.42*  CALCIUM 8.7*  GLUCOSE 211*   Lab Results  Component Value Date   TROPONINI <0.03 07/12/2018   No results found for: CHOL No results found for: HDL No results found for: LDLCALC No results found for: TRIG No results found for: CHOLHDL No results found for: LDLDIRECT    Radiology: Dg Chest 2 View  Result Date: 07/12/2018 CLINICAL DATA:  Shortness of breath EXAM: CHEST - 2 VIEW COMPARISON:  07/07/2015 FINDINGS: Cardiac shadow is mildly prominent. Aortic calcifications are seen. Postsurgical changes noted on the left in the subclavicular region. Cervical spine surgical changes are noted as well. Increased vascular congestion is noted with mild interstitial edema consistent with CHF. 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 No bony abnormality is noted. IMPRESSION: Mild CHF. Electronically Signed   By: Inez Catalina M.D.   On: 07/12/2018 22:41    EKG: Atrial fibrillation at a rate of 89 bpm  ASSESSMENT AND PLAN:   1.  Acute on chronic systolic congestive heart failure, not significant fluid retention, appears improved after initial diuresis.  Patient laying flat in bed, denies shortness of breath, appears euvolemic. 2.  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  Status post TAVR, stable appearing aortic valve prosthesis by echocardiogram 06/24/2018 4.  Persistent atrial fibrillation, on low-dose Eliquis, scheduled for elective cardioversion 07/15/2018, rate well controlled 6.  Acute kidney injury with underlying chronic kidney disease, with  history of focal segmental glomerulosclerosis  Recommendations  1.  Continue current medications 2.  Continue low-dose Eliquis 3.  Furosemide IV as needed 4.  Await nephrology consult 5.  Elective cardioversion scheduled for 07/15/2018.  No indication to proceed while inpatient especially in light of labile renal function.  Heart rate well controlled, patient asymptomatic.  Signed: Isaias Cowman MD,PhD, [facility] 07/13/2018, 9:13 AM

## 2018-07-13 NOTE — ED Notes (Signed)
Second call report

## 2018-07-13 NOTE — Evaluation (Signed)
Physical Therapy Evaluation Patient Details Name: [name] MRN: [id number] DOB: 07-21-1950 Today's Date: 07/13/2018   History of Present Illness  Patient is a pleasant 68 year old female who presents to the hospital for acute respiratory failure with hypoxia. Patient's PMH includes HTN, HLD, hypothyroidism, DM, CDK III-IV, severe aortic stenosis s/p TAVR, a fib on eliquis, neuropathy, and vertigo.   Clinical Impression  Patient is a pleasant 68 year old female who presents with generalized weakness and instability. Patient's Sp02 and HR monitored with transfers and ambulation with Sp02 remaining >90% at all times on room air. Patient's HR elevated with ambulation to 135 however returned to functional range after sitting and resting. Nursing notified. Patient ambulated 160 ft with one seated rest break with significant fatigue using RW and CGA. Patient demonstrates safe cognition and awareness of deficits. During hospitalization patient will benefit from skilled PT to increase strength, stability, capacity for functional mobility, and decrease falls risk. Upon discharge from hospital patient will benefit from HHPT to continue addressing functional deficits listed above.     Follow Up Recommendations Home health PT    Equipment Recommendations  None recommended by PT    Recommendations for Other Services       Precautions / Restrictions Precautions Precautions: Fall Restrictions Weight Bearing Restrictions: No      Mobility  Bed Mobility Overal bed mobility: Modified Independent             General bed mobility comments: requires additional time   Transfers Overall transfer level: Needs assistance Equipment used: Rolling walker (2 wheeled) Transfers: Sit to/from Stand Sit to Stand: Min guard;Supervision         General transfer comment: Patient performs with CGA/supervision with cueing for hand placement  Ambulation/Gait Ambulation/Gait assistance: Min guard Gait  Distance (Feet): 160 Feet(with one seated rest break) Assistive device: Rolling walker (2 wheeled)   Gait velocity: decreased   General Gait Details: Patient requires one seated rest break for 4 minutes ; demonstrates flexed trunk, narrow BOS, and decreased propulsion with limited plantarflexion during pre-swing phase.   Stairs            Wheelchair Mobility    Modified Rankin (Stroke Patients Only)       Balance Overall balance assessment: Needs assistance Sitting-balance support: Feet supported Sitting balance-Leahy Scale: Fair Sitting balance - Comments: reaches inside and outside BOS without LOB   Standing balance support: Bilateral upper extremity supported;During functional activity;Single extremity supported Standing balance-Leahy Scale: Fair Standing balance comment: Patient able to static stand with SUE support however due to fatigue requires BUE support for ambulation                              Pertinent Vitals/Pain Pain Assessment: 0-10 Pain Score: 5  Pain Location: back/neck Pain Descriptors / Indicators: Aching Pain Intervention(s): Limited activity within patient's tolerance;Monitored during session;Repositioned    Home Living Family/patient expects to be discharged to:: Private residence Living Arrangements: Other relatives(lives with sister and mom) Available Help at Discharge: Family Type of Home: House Home Access: (1 step )     Home Layout: One level Home Equipment: Walker - 4 wheels;Cane - single point;Shower seat;Transport chair Additional Comments: Patient reports she usually takes a bath rather than shower but does have a shower chair. Mom is bedbound and sister helps her and her mom    Prior Function Level of Independence: Independent with assistive device(s);Needs assistance  Gait / Transfers Assistance Needed: patient reports she uses a cane or rollator when she is walking around the community  ADL's / Homemaking  Assistance Needed: patient reports her sister does the cooking  Comments: Sister helps with house/cooking. Patient helps with bed bound mom as much as she can.      Hand Dominance   Dominant Hand: Right    Extremity/Trunk Assessment   Upper Extremity Assessment Upper Extremity Assessment: Generalized weakness    Lower Extremity Assessment Lower Extremity Assessment: RLE deficits/detail;LLE deficits/detail RLE Deficits / Details: 4-/5  RLE Sensation: decreased light touch RLE Coordination: decreased gross motor LLE Deficits / Details: grossly 3+/5 LLE Sensation: decreased light touch LLE Coordination: decreased gross motor       Communication   Communication: No difficulties  Cognition Arousal/Alertness: Awake/alert Behavior During Therapy: WFL for tasks assessed/performed Overall Cognitive Status: Within Functional Limits for tasks assessed                                 General Comments: A and O x4      General Comments General comments (skin integrity, edema, etc.): brusing of LE's    Exercises Other Exercises Other Exercises: patient educated on safe mobility and transfers, energy conservation, breathing technique, and decreasing fall risk.    Assessment/Plan    PT Assessment Patient needs continued PT services  PT Problem List Decreased strength;Decreased activity tolerance;Decreased coordination;Decreased mobility;Decreased balance       PT Treatment Interventions DME instruction;Gait training;Stair training;Therapeutic exercise;Therapeutic activities;Balance training;Functional mobility training;Neuromuscular re-education;Manual techniques;Patient/family education    PT Goals (Current goals can be found in the Care Plan section)  Acute Rehab PT Goals Patient Stated Goal: to return home PT Goal Formulation: With patient Time For Goal Achievement: 07/27/18 Potential to Achieve Goals: Fair    Frequency Min 2X/week   Barriers to discharge  Decreased caregiver support would benefit from HHPT    Co-evaluation               AM-PAC PT "6 Clicks" Mobility  Outcome Measure Help needed turning from your back to your side while in a flat bed without using bedrails?: A Little Help needed moving from lying on your back to sitting on the side of a flat bed without using bedrails?: A Little Help needed moving to and from a bed to a chair (including a wheelchair)?: A Little Help needed standing up from a chair using your arms (e.g., wheelchair or bedside chair)?: A Little Help needed to walk in hospital room?: A Little Help needed climbing 3-5 steps with a railing? : A Lot 6 Click Score: 17    End of Session Equipment Utilized During Treatment: Gait belt Activity Tolerance: Patient tolerated treatment well;Patient limited by fatigue Patient left: in bed;with call bell/phone within reach;with bed alarm set Nurse Communication: Mobility status;Other (comment)(HR elevation> 135 with ambulation, SP02>90% ) PT Visit Diagnosis: Unsteadiness on feet (R26.81);Other abnormalities of gait and mobility (R26.89);Muscle weakness (generalized) (M62.81);Difficulty in walking, not elsewhere classified (R26.2)    Time: 6295-2841 PT Time Calculation (min) (ACUTE ONLY): 18 min   Charges:   PT Evaluation $PT Eval Low Complexity: 1 Low PT Treatments $Gait Training: 8-22 mins        Janna Arch, PT, DPT    Janna Arch 07/13/2018, 1:51 PM

## 2018-07-14 LAB — BASIC METABOLIC PANEL
Anion gap: 10 (ref 5–15)
BUN: 52 mg/dL — ABNORMAL HIGH (ref 8–23)
CO2: 25 mmol/L (ref 22–32)
Calcium: 8.5 mg/dL — ABNORMAL LOW (ref 8.9–10.3)
Chloride: 104 mmol/L (ref 98–111)
Creatinine, Ser: 3.27 mg/dL — ABNORMAL HIGH (ref 0.44–1.00)
GFR calc Af Amer: 16 mL/min — ABNORMAL LOW (ref >60)
GFR calc non Af Amer: 14 mL/min — ABNORMAL LOW (ref >60)
Glucose, Bld: 195 mg/dL — ABNORMAL HIGH (ref 70–99)
Potassium: 4.5 mmol/L (ref 3.5–5.1)
Sodium: 139 mmol/L (ref 135–145)

## 2018-07-14 LAB — GLUCOSE, CAPILLARY
Glucose-Capillary: 201 mg/dL — ABNORMAL HIGH (ref 70–99)
Glucose-Capillary: 183 mg/dL — ABNORMAL HIGH (ref 70–99)
Glucose-Capillary: 234 mg/dL — ABNORMAL HIGH (ref 70–99)
Glucose-Capillary: 187 mg/dL — ABNORMAL HIGH (ref 70–99)
Glucose-Capillary: 204 mg/dL — ABNORMAL HIGH (ref 70–99)

## 2018-07-14 MED ORDER — TORSEMIDE 20 MG PO TABS
20.0000 mg | ORAL_TABLET | Freq: Every day | ORAL | Status: DC
  Administered 2018-07-14 – 2018-07-15 (×2): 20 mg via ORAL
  Filled 2018-07-14 (×2): qty 1

## 2018-07-14 MED ORDER — OXYCODONE HCL 5 MG PO TABS
5.0000 mg | ORAL_TABLET | Freq: Three times a day (TID) | ORAL | Status: DC | PRN
  Administered 2018-07-14 – 2018-07-15 (×3): 5 mg via ORAL
  Filled 2018-07-14 (×3): qty 1

## 2018-07-14 MED ORDER — SODIUM CHLORIDE 0.9 % IV SOLN
INTRAVENOUS | Status: DC
  Administered 2018-07-15: 06:00:00 via INTRAVENOUS

## 2018-07-14 NOTE — Anesthesia Preprocedure Evaluation (Addendum)
Anesthesia Evaluation  Patient identified by MRN, date of birth, ID band Patient awake    Reviewed: Allergy & Precautions, H&P , NPO status , Patient's Chart, lab work & pertinent test results  Airway Mallampati: II  TM Distance: >3 FB     Dental  (+) Missing, Chipped   Pulmonary shortness of breath and with exertion, sleep apnea (suspected, sleep study recommended but has not done t yet) , former smoker,           Cardiovascular hypertension, +CHF  + dysrhythmias (paroxysmal SVT, AFib) Atrial Fibrillation and Supra Ventricular Tachycardia + Valvular Problems/Murmurs (severe AS s/p TAVR 2019)   Echo 06/24/18: SEVERE LV SYSTOLIC DYSFUNCTION, EF 10% MODERATE LVH NORMAL RIGHT VENTRICULAR SYSTOLIC FUNCTION, elevated RV pressures MODERATE MR, mod TR NO VALVULAR STENOSIS Prosthetic aortic valve   Neuro/Psych PSYCHIATRIC DISORDERS Anxiety Depression  Neuromuscular disease (chronic back pain, polyneuropathy)    GI/Hepatic negative GI ROS, Neg liver ROS,   Endo/Other  diabetes, Poorly ControlledHypothyroidism Morbid obesity  Renal/GU CRFRenal disease  negative genitourinary   Musculoskeletal   Abdominal   Peds  Hematology  (+) Blood dyscrasia, anemia ,   Anesthesia Other Findings Past Medical History: No date: Anemia No date: Anxiety No date: Aortic valve stenosis No date: Arthritis     Comment:  feet, legs No date: B12 deficiency No date: Bowel obstruction (HCC) No date: CKD (chronic kidney disease)     Comment:  protein in urine No date: Colostomy in place [facility]) No date: Diabetes mellitus without complication (HCC) No date: Diverticulitis large intestine No date: Dysrhythmia No date: Ectopic atrial tachycardia (HCC) No date: FSGS (focal segmental glomerulosclerosis) No date: Gastritis No date: GI bleed No date: Heart murmur No date: Hyperlipidemia No date: Hypertension No date: Hypothyroidism No date:  Hypothyroidism     Comment:  unspecified No date: MRSA (methicillin resistant Staphylococcus aureus)     Comment:  at abdominal wound.  Jan 2017.  Treated.  No date: Neuropathy No date: Neuropathy in diabetes ([place]) No date: Obesity No date: Pedal edema No date: Psoriasis No date: Shortness of breath dyspnea No date: Vertigo   Reproductive/Obstetrics negative OB ROS                           Anesthesia Physical Anesthesia Plan  ASA: IV  Anesthesia Plan: General   Post-op Pain Management:    Induction:   PONV Risk Score and Plan:   Airway Management Planned: Nasal Cannula and Natural Airway  Additional Equipment:   Intra-op Plan:   Post-operative Plan:   Informed Consent: I have reviewed the patients History and Physical, chart, labs and discussed the procedure including the risks, benefits and alternatives for the proposed anesthesia with the patient or authorized representative who has indicated his/her understanding and acceptance.     Dental Advisory Given  Plan Discussed with: Anesthesiologist and CRNA  Anesthesia Plan Comments:        Anesthesia Quick Evaluation

## 2018-07-14 NOTE — Progress Notes (Signed)
[facility] Cardiology  SUBJECTIVE: Patient laying flat in bed, denies shortness of breath   Vitals:   07/13/18 0830 07/13/18 1702 07/13/18 1944 07/14/18 0401  BP:  109/84 107/87 122/90  Pulse: 89 92 95 84  Resp:   20 20  Temp:  98.2 F (36.8 C) 98.6 F (37 C) 98.1 F (36.7 C)  TempSrc:  Oral Oral Oral  SpO2:  93% 96% 94%  Weight:    107.3 kg  Height:         Intake/Output Summary (Last 24 hours) at 07/14/2018 2956 Last data filed at 07/14/2018 0403 Gross per 24 hour  Intake 243 ml  Output 1950 ml  Net -1707 ml      PHYSICAL EXAM  General: Well developed, well nourished, in no acute distress HEENT:  Normocephalic and atramatic Neck:  No JVD.  Lungs: Clear bilaterally to auscultation and percussion. Heart: HRRR . Normal S1 and S2 without gallops or murmurs.  Abdomen: Bowel sounds are positive, abdomen soft and non-tender  Msk:  Back normal, normal gait. Normal strength and tone for age. Extremities: No clubbing, cyanosis or edema.   Neuro: Alert and oriented X 3. Psych:  Good affect, responds appropriately   LABS: Basic Metabolic Panel: Recent Labs    07/13/18 0159 07/14/18 0254  NA 139 139  K 4.5 4.5  CL 103 104  CO2 27 25  GLUCOSE 211* 195*  BUN 47* 52*  CREATININE 3.42* 3.27*  CALCIUM 8.7* 8.5*   Liver Function Tests: No results for input(s): AST, ALT, ALKPHOS, BILITOT, PROT, ALBUMIN in the last 72 hours. No results for input(s): LIPASE, AMYLASE in the last 72 hours. CBC: Recent Labs    07/12/18 2203  WBC 11.2*  HGB 12.0  HCT 40.2  MCV 87.0  PLT 228   Cardiac Enzymes: Recent Labs    07/12/18 2203  TROPONINI <0.03   BNP: Invalid input(s): POCBNP D-Dimer: No results for input(s): DDIMER in the last 72 hours. Hemoglobin A1C: No results for input(s): HGBA1C in the last 72 hours. Fasting Lipid Panel: No results for input(s): CHOL, HDL, LDLCALC, TRIG, CHOLHDL, LDLDIRECT in the last 72 hours. Thyroid Function Tests: No results for input(s): TSH,  T4TOTAL, T3FREE, THYROIDAB in the last 72 hours.  Invalid input(s): FREET3 Anemia Panel: No results for input(s): VITAMINB12, FOLATE, FERRITIN, TIBC, IRON, RETICCTPCT in the last 72 hours.  Dg Chest 2 View  Result Date: 07/12/2018 CLINICAL DATA:  Shortness of breath EXAM: CHEST - 2 VIEW COMPARISON:  07/07/2015 FINDINGS: Cardiac shadow is mildly prominent. Aortic calcifications are seen. Postsurgical changes noted on the left in the subclavicular region. Cervical spine surgical changes are noted as well. Increased vascular congestion is noted with mild interstitial edema consistent with CHF. 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 No bony abnormality is noted. IMPRESSION: Mild CHF. Electronically Signed   By: Inez Catalina M.D.   On: 07/12/2018 22:41   US Renal  Result Date: 07/13/2018 CLINICAL DATA:  Acute kidney injury. Chronic kidney disease stage 3/4. EXAM: RENAL / URINARY TRACT ULTRASOUND COMPLETE COMPARISON:  Renal ultrasound 07/16/2015. Abdominopelvic CT 02/21/2015. FINDINGS: Right Kidney: Renal measurements: 9.1 x 3.6 x 5.1 cm = volume: 87.0 mL. There is cortical thinning with increased cortical echogenicity. No focal lesion or hydronephrosis. Left Kidney: Renal measurements: 9.5 x 3.9 x 3.5 cm = volume: 67.5 mL. There is cortical thinning with increased cortical echogenicity. No focal lesion or hydronephrosis. Bladder: Appears normal for degree of bladder distention. IMPRESSION: 1. Bilateral renal cortical thinning  and increased echogenicity consistent with chronic medical renal disease. The right kidney measures slightly smaller than on the previous study. 2. No hydronephrosis. Electronically Signed   By: Richardean Sale M.D.   On: 07/13/2018 14:07     Echo   TELEMETRY: Atrial fibrillation 80 bpm:  ASSESSMENT AND PLAN:  Active Problems:   Acute respiratory failure with hypoxia (HCC)    1.  Acute on chronic systolic congestive heart failure, improved after diuresis 2.  Nonischemic dilated  cardiomyopathy 3.  Status post TAVR 4.  Persistent atrial fibrillation on low-dose Eliquis scheduled for elective cardioversion 07/15/2018 5.  Acute kidney injury with underlying chronic kidney disease with history of focal segmental glomerulosclerosis  Recommendations  1.  Continue current medications 2.  Continue low-dose Eliquis 3.  Furosemide IV as needed 4.  Elective cardioversion scheduled for 07/15/2018  Sign off for now, please call if any questions   Isaias Cowman, MD, PhD, [facility] 07/14/2018 8:19 AM

## 2018-07-14 NOTE — Progress Notes (Signed)
Inpatient Diabetes Program Recommendations  AACE/ADA: New Consensus Statement on Inpatient Glycemic Control (2015)  Target Ranges:  Prepandial:   less than 140 mg/dL      Peak postprandial:   less than 180 mg/dL (1-2 hours)      Critically ill patients:  140 - 180 mg/dL   Lab Results  Component Value Date   GLUCAP 183 (H) 07/14/2018   HGBA1C 9.2 (H) 02/04/2015    Review of Glycemic Control Results for [name], [name] (MRN [id number]) as of 07/14/2018 10:54  Ref. Range 07/13/2018 07:35 07/13/2018 12:20 07/13/2018 17:03 07/13/2018 20:37 07/14/2018 08:33  Glucose-Capillary Latest Ref Range: 70 - 99 mg/dL 175 (H) 215 (H) 161 (H) 236 (H) 183 (H)   Diabetes history: DM 2 Outpatient Diabetes medications:  Humalog 22 units in the AM and 38 units in the evening Lantus 50 units q HS Current orders for Inpatient glycemic control: Novolog moderate tid with meals and HS Lantus 25 units q HS Inpatient Diabetes Program Recommendations:    Please consider increasing Lantus to 30 units q HS.  Also consider adding Novolog meal coverage 4 units tid with meals (hold if patient eats less than 50%).   Thanks,  Adah Perl, RN, BC-ADM Inpatient Diabetes Coordinator Pager [phone] (8a-5p)

## 2018-07-14 NOTE — Progress Notes (Signed)
Patient ID: LAVONN MAXCY, female   DOB: [birth year], 68 y.o.   MRN: [id number]  [facility] PROGRESS NOTE  KYNISHA MEMON HKV:[id number] DOB: [birth year] DOA: 07/12/2018 PCP: Adin Hector, MD  HPI/Subjective: Patient is still short of breath.  Feeling better than when she came in.  She wanted me to contact Dr. Nehemiah Massed to see if she can have her cardioversion while she is here in the hospital.  This is scheduled for tomorrow morning.  Objective: Vitals:   07/14/18 0401 07/14/18 0833  BP: 122/90 (!) 129/109  Pulse: 84 80  Resp: 20 20  Temp: 98.1 F (36.7 C) 99.4 F (37.4 C)  SpO2: 94% 92%    Filed Weights   07/12/18 2206 07/13/18 0500 07/14/18 0401  Weight: 102.1 kg 106.8 kg 107.3 kg    ROS: Review of Systems  Constitutional: Negative for chills and fever.  Eyes: Negative for blurred vision.  Respiratory: Positive for shortness of breath. Negative for cough.   Cardiovascular: Negative for chest pain.  Gastrointestinal: Negative for abdominal pain, constipation, diarrhea, nausea and vomiting.  Genitourinary: Negative for dysuria.  Musculoskeletal: Negative for joint pain.  Neurological: Negative for dizziness and headaches.   Exam: Physical Exam  Constitutional: She is oriented to person, place, and time.  HENT:  Nose: No mucosal edema.  Mouth/Throat: No oropharyngeal exudate or posterior oropharyngeal edema.  Eyes: Pupils are equal, round, and reactive to light. Conjunctivae, EOM and lids are normal.  Neck: No JVD present. Carotid bruit is not present. No edema present. No thyroid mass and no thyromegaly present.  Cardiovascular: S1 normal and S2 normal. An irregularly irregular rhythm present. Exam reveals no gallop.  No murmur heard. Pulses:      Dorsalis pedis pulses are 2+ on the right side and 2+ on the left side.  Respiratory: No respiratory distress. She has decreased breath sounds in the right lower field and the left lower field. She has no wheezes. She  has no rhonchi. She has no rales.  GI: Soft. Bowel sounds are normal. There is no abdominal tenderness.  Musculoskeletal:     Right ankle: She exhibits swelling.     Left ankle: She exhibits swelling.  Lymphadenopathy:    She has no cervical adenopathy.  Neurological: She is alert and oriented to person, place, and time. No cranial nerve deficit.  Skin: Skin is warm. No rash noted. Nails show no clubbing.  Psychiatric: She has a normal mood and affect.      Data Reviewed: Basic Metabolic Panel: Recent Labs  Lab 07/12/18 2203 07/13/18 0159 07/14/18 0254  NA 139 139 139  K 4.5 4.5 4.5  CL 103 103 104  CO2 27 27 25   GLUCOSE 104* 211* 195*  BUN 46* 47* 52*  CREATININE 3.39* 3.42* 3.27*  CALCIUM 8.7* 8.7* 8.5*   CBC: Recent Labs  Lab 07/12/18 2203  WBC 11.2*  HGB 12.0  HCT 40.2  MCV 87.0  PLT 228   Cardiac Enzymes: Recent Labs  Lab 07/12/18 2203  TROPONINI <0.03   BNP (last 3 results) Recent Labs    07/13/18 0159  BNP 307.0*    CBG: Recent Labs  Lab 07/13/18 1220 07/13/18 1703 07/13/18 2037 07/14/18 0833 07/14/18 1126  GLUCAP 215* 161* 236* 183* 234*    Recent Results (from the past 240 hour(s))  Novel Coronavirus, NAA (hospital order; send-out to ref lab)     Status: None   Collection Time: 07/10/18 12:01 PM  Result Value Ref  Range Status   SARS-CoV-2, NAA NOT DETECTED NOT DETECTED Final    Comment: (NOTE) Testing was performed using the cobas(R) SARS-CoV-2 test. This test was developed and its performance characteristics determined by [employer]. This test has not been FDA cleared or approved. This test has been authorized by FDA under an Emergency Use Authorization (EUA). This test is only authorized for the duration of time the declaration that circumstances exist justifying the authorization of the emergency use of in vitro diagnostic tests for detection of SARS-CoV-2 virus and/or diagnosis of COVID-19 infection under section  564(b)(1) of the Act, 21 U.S.C. 962EZM-6(Q)(9), unless the authorization is terminated or revoked sooner. When diagnostic testing is negative, the possibility of a false negative result should be considered in the context of a patient's recent exposures and the presence of clinical signs and symptoms consistent with COVID-19. An individual without symptoms of COVID-19 and who is not shedding SARS-CoV-2 virus would expect to have  a negative (not detected) result in this assay. Performed At: [facility] [address] [place], [place] [id number] Rush Farmer MD TW:[id number]    [place]  Final    Comment: Performed at [facility], [address] [place], [place] [address]  MRSA PCR Screening     Status: Abnormal   Collection Time: 07/13/18  2:01 AM  Result Value Ref Range Status   MRSA by PCR POSITIVE (A) NEGATIVE Final    Comment:        The GeneXpert MRSA Assay (FDA approved for NASAL specimens only), is one component of a comprehensive MRSA colonization surveillance program. It is not intended to diagnose MRSA infection nor to guide or monitor treatment for MRSA infections. RESULT CALLED TO, READ BACK BY AND VERIFIED WITH: TAMARA CONNERS AT 0408 ON 07/13/2018 JJB Performed at [facility], [address] [place], [place] [address]      Studies: Dg Chest 2 View  Result Date: 07/12/2018 CLINICAL DATA:  Shortness of breath EXAM: CHEST - 2 VIEW COMPARISON:  07/07/2015 FINDINGS: Cardiac shadow is mildly prominent. Aortic calcifications are seen. Postsurgical changes noted on the left in the subclavicular region. Cervical spine surgical changes are noted as well. Increased vascular congestion is noted with mild interstitial edema consistent with CHF. 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 No bony abnormality is noted. IMPRESSION: Mild CHF. Electronically Signed   By: Inez Catalina M.D.   On: 07/12/2018 22:41   US Renal  Result  Date: 07/13/2018 CLINICAL DATA:  Acute kidney injury. Chronic kidney disease stage 3/4. EXAM: RENAL / URINARY TRACT ULTRASOUND COMPLETE COMPARISON:  Renal ultrasound 07/16/2015. Abdominopelvic CT 02/21/2015. FINDINGS: Right Kidney: Renal measurements: 9.1 x 3.6 x 5.1 cm = volume: 87.0 mL. There is cortical thinning with increased cortical echogenicity. No focal lesion or hydronephrosis. Left Kidney: Renal measurements: 9.5 x 3.9 x 3.5 cm = volume: 67.5 mL. There is cortical thinning with increased cortical echogenicity. No focal lesion or hydronephrosis. Bladder: Appears normal for degree of bladder distention. IMPRESSION: 1. Bilateral renal cortical thinning and increased echogenicity consistent with chronic medical renal disease. The right kidney measures slightly smaller than on the previous study. 2. No hydronephrosis. Electronically Signed   By: Richardean Sale M.D.   On: 07/13/2018 14:07    Scheduled Meds: . amiodarone  200 mg Oral BID  . amLODipine  5 mg Oral Daily  . apixaban  5 mg Oral BID  . atorvastatin  40 mg Oral Daily  . calcitRIOL  0.25 mcg  Oral Daily  . Chlorhexidine Gluconate Cloth  6 each Topical Q0600  . DULoxetine  30 mg Oral Daily  . insulin aspart  0-15 Units Subcutaneous TID WC  . insulin aspart  0-5 Units Subcutaneous QHS  . insulin glargine  25 Units Subcutaneous QHS  . levothyroxine  88 mcg Oral QAC breakfast  . metoprolol succinate  50 mg Oral BID  . mupirocin ointment  1 application Nasal BID  . sodium chloride flush  3 mL Intravenous Q12H  . torsemide  20 mg Oral Daily   Continuous Infusions: . sodium chloride      Assessment/Plan:  1. Acute hypoxic respiratory failure.  This has resolved and the patient is off oxygen at this point. 2. Acute on chronic systolic congestive heart failure.  Last ejection fraction 25%.  On IV Lasix was held secondary to increasing creatinine.  I will start torsemide. 3. Acute kidney injury on chronic kidney disease stage IV.   Spoke with Dr. Juleen China and her baseline creatinine is now 3.05 in early May.  Likely will need a higher dose of torsemide upon going home.  Recheck creatinine tomorrow. 4. Atrial fibrillation.  Spoke with Dr. Nehemiah Massed and she will have cardioversion here and then hopefully be discharged tomorrow.  On Eliquis for anticoagulation. 5. Hypertension.  Continue usual medications 6. Hyperlipidemia unspecified on atorvastatin 7. Weakness.  Physical therapy evaluation  Code Status:     Code Status Orders  (From admission, onward)         Start     Ordered   07/13/18 0145  Full code  Continuous     07/13/18 0144        Code Status History    Date Active Date Inactive Code Status Order ID Comments User Context   07/13/2015 1144 07/22/2015 1620 Full Code [id number]  Lynnell Jude, RN Inpatient   02/21/2015 1453 03/13/2015 2115 Full Code [id number]  Clayburn Pert, MD Inpatient   02/04/2015 0726 02/15/2015 1949 Full Code [id number]  Sherri Rad, MD Inpatient     Disposition Plan: Potential discharge tomorrow after cardioversion  Consultants:  Cardiology  Time spent: 25 minutes.  Spoke with 2 cardiologist.  [facility]

## 2018-07-14 NOTE — TOC Initial Note (Signed)
Transition of Care [facility]) - Initial/Assessment Note    Patient Details  Name: [name] MRN: [id number] Date of Birth: 06/14/1950  Transition of Care [facility]) CM/SW Contact:    Elza Rafter, RN Phone Number: 07/14/2018, 1:16 PM  Clinical Narrative:      Patient is from home with sister.  She is independent in all ADL's.  PCP Dr. Caryl Comes; [facility] without difficulty.  Sister drives to appointments.  No difficulty obtaining medical care or housing.  She has a walker as needed.  No oxygen requirements.  No further needs at this time.           Expected Discharge Plan: Home/Self Care Barriers to Discharge: Continued Medical Work up   Patient Goals and CMS Choice        Expected Discharge Plan and Services Expected Discharge Plan: Home/Self Care   Discharge Planning Services: CM Consult   Living arrangements for the past 2 months: Single Family Home                           HH Arranged: Patient Refused HH          Prior Living Arrangements/Services Living arrangements for the past 2 months: Single Family Home   Patient language and need for interpreter reviewed:: No Do you feel safe going back to the place where you live?: Yes            Criminal Activity/Legal Involvement Pertinent to Current Situation/Hospitalization: No - Comment as needed  Activities of Daily Living Home Assistive Devices/Equipment: Cane (specify quad or straight), CBG Meter, Eyeglasses, Shower chair with back, Other (Comment)(rolator) ADL Screening (condition at time of admission) Patient's cognitive ability adequate to safely complete daily activities?: Yes Is the patient deaf or have difficulty hearing?: No Does the patient have difficulty seeing, even when wearing glasses/contacts?: No Does the patient have difficulty concentrating, remembering, or making decisions?: No Patient able to express need for assistance with ADLs?: Yes Does the patient have  difficulty dressing or bathing?: No Independently performs ADLs?: Yes (appropriate for developmental age) Does the patient have difficulty walking or climbing stairs?: Yes Weakness of Legs: Both Weakness of Arms/Hands: Both  Permission Sought/Granted                  Emotional Assessment Appearance:: Appears stated age Attitude/Demeanor/Rapport: Gracious Affect (typically observed): Accepting Orientation: : Oriented to Self, Oriented to Place, Oriented to  Time, Oriented to Situation Alcohol / Substance Use: Not Applicable    Admission diagnosis:  SOB (shortness of breath) [R06.02] Hypoxia [R09.02] Congestive heart failure, unspecified HF chronicity, unspecified heart failure type [facility]) [I50.9] Patient Active Problem List   Diagnosis Date Noted  . Acute respiratory failure with hypoxia ([place]) 07/12/2018  . DM type 2 with diabetic peripheral neuropathy ([place]) 11/25/2017  . 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 Acute kidney injury superimposed on chronic kidney disease ([place]) 09/01/2017  . CHF (congestive heart failure), NYHA class II, chronic, diastolic ([place]) 62/13/0865  . Paroxysmal A-fib ([place]) 08/14/2017  . Cervical myelopathy ([place]) 08/07/2017  . Onychomycosis of multiple toenails with type 2 diabetes mellitus and peripheral neuropathy ([place]) 06/23/2017  . Myofascial pain 05/27/2017  . Chronic pain of left upper extremity 05/27/2017  . Morbid obesity ([place]) 05/06/2017  . Uncontrolled type 2 diabetes mellitus with hyperglycemia, with long-term current use of insulin ([place]) 02/07/2017  . Hyperlipidemia, mixed 08/08/2016  . Hx  of adenomatous colonic polyps 06/14/2016  . 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 Chronic pain syndrome 03/11/2016  . Obesity, Class II, BMI 35-39.9 03/11/2016  . Osteopenia of multiple sites 01/26/2016  . Heart burn 12/18/2015  . Polyneuropathy 11/16/2015  . Lumbar spondylosis 09/25/2015  . Lumbar facet  syndrome (Bilateral) (L>R) 09/25/2015  . Recurrent major depressive disorder, in full remission ([place]) 09/19/2015  . Diabetic neuropathy ([place]) 09/06/2015  . H/O colostomy 07/12/2015  . Chronic abdominal pain (Location of Primary Source of Pain) (Bilateral) (L>R) 07/03/2015  . Long term current use of opiate analgesic 07/03/2015  . Long term prescription opiate use 07/03/2015  . Opiate use (22.5 MME/Day) 07/03/2015  . Encounter for therapeutic drug level monitoring 07/03/2015  . Encounter for pain management planning 07/03/2015  . Neurogenic pain 07/03/2015  . Musculoskeletal pain 07/03/2015  . Visceral abdominal pain 07/03/2015  . Diabetic peripheral neuropathy ([place]) 07/03/2015  . Chronic low back pain (Location of Secondary source of pain) (Bilateral) (L>R) 07/03/2015  . Chronic lower extremity pain (Location of Tertiary source of pain) (Bilateral) (R>L) 07/03/2015  . Chronic hip pain (Bilateral) (L>R) 07/03/2015  . Chronic knee pain (Bilateral) (L>R) 07/03/2015  . Chronic neck pain (Bilateral) (L>R) 07/03/2015  . Anemia 02/16/2015  . Arthritis 02/16/2015  . CKD (chronic kidney disease) stage 4, GFR 15-29 ml/min (HCC) 02/16/2015  . Focal and segmental hyalinosis 02/16/2015  . Bleeding gastrointestinal 02/16/2015  . Acquired atrophy of thyroid 02/16/2015  . Psoriasis 02/16/2015  . Small bowel obstruction ([place]) 02/04/2015  . Severe aortic stenosis 10/11/2014  . Paroxysmal supraventricular tachycardia ([place]) 10/11/2014  . SOBOE (shortness of breath on exertion) 10/11/2014  . Benign essential HTN 10/04/2014  . Obesity 09/05/2014  . B12 deficiency 03/02/2014  . Diabetes mellitus type 2 in obese ([place]) 12/01/2013  . Abnormal presence of protein in urine 10/15/2010   PCP:  Adin Hector, MD Pharmacy:   [id number] [place], [place] [address] Phone: [phone] Fax: [phone]     Social Determinants of Health (SDOH)  Interventions    Readmission Risk Interventions Readmission Risk Prevention Plan 07/14/2018  Transportation Screening Complete  PCP or Specialist Appt within 5-7 Days Complete  Home Care Screening Complete  Medication Review (RN CM) Complete  Some recent data might be hidden

## 2018-07-15 ENCOUNTER — Ambulatory Visit: Admission: RE | Admit: 2018-07-15 | Payer: Medicare Other | Source: Home / Self Care | Admitting: Internal Medicine

## 2018-07-15 ENCOUNTER — Other Ambulatory Visit: Payer: Self-pay

## 2018-07-15 ENCOUNTER — Encounter
Admission: EM | Disposition: A | Discharge status: Home / Self Care | Payer: Self-pay | Source: Home / Self Care | Attending: Internal Medicine

## 2018-07-15 ENCOUNTER — Inpatient Hospital Stay: Payer: Medicare Other | Admitting: Anesthesiology

## 2018-07-15 ENCOUNTER — Encounter: Payer: Self-pay | Admitting: Internal Medicine

## 2018-07-15 HISTORY — PX: CARDIOVERSION: SHX1299

## 2018-07-15 LAB — GLUCOSE, CAPILLARY
Glucose-Capillary: 124 mg/dL — ABNORMAL HIGH (ref 70–99)
Glucose-Capillary: 125 mg/dL — ABNORMAL HIGH (ref 70–99)

## 2018-07-15 LAB — BASIC METABOLIC PANEL
Anion gap: 10 (ref 5–15)
BUN: 55 mg/dL — ABNORMAL HIGH (ref 8–23)
CO2: 25 mmol/L (ref 22–32)
Calcium: 8.5 mg/dL — ABNORMAL LOW (ref 8.9–10.3)
Chloride: 104 mmol/L (ref 98–111)
Creatinine, Ser: 3.39 mg/dL — ABNORMAL HIGH (ref 0.44–1.00)
GFR calc Af Amer: 15 mL/min — ABNORMAL LOW (ref >60)
GFR calc non Af Amer: 13 mL/min — ABNORMAL LOW (ref >60)
Glucose, Bld: 137 mg/dL — ABNORMAL HIGH (ref 70–99)
Potassium: 4.4 mmol/L (ref 3.5–5.1)
Sodium: 139 mmol/L (ref 135–145)

## 2018-07-15 LAB — HIV ANTIBODY (ROUTINE TESTING W REFLEX): HIV Screen 4th Generation wRfx: NONREACTIVE

## 2018-07-15 SURGERY — CARDIOVERSION
Anesthesia: General

## 2018-07-15 MED ORDER — LANTUS SOLOSTAR 100 UNIT/ML ~~LOC~~ SOPN: 25.0000 [IU] | PEN_INJECTOR | Freq: Every day | SUBCUTANEOUS | 11 refills | Status: DC

## 2018-07-15 MED ORDER — PROPOFOL 10 MG/ML IV BOLUS
INTRAVENOUS | Status: DC | PRN
  Administered 2018-07-15: 40 mg via INTRAVENOUS

## 2018-07-15 MED ORDER — HUMALOG KWIKPEN 100 UNIT/ML ~~LOC~~ SOPN: PEN_INJECTOR | SUBCUTANEOUS | 11 refills | Status: DC

## 2018-07-15 MED ORDER — TORSEMIDE 20 MG PO TABS: ORAL_TABLET | ORAL | 0 refills | Status: DC

## 2018-07-15 NOTE — Progress Notes (Signed)
[facility] Encounter Note  Patient: [name] / Admit Date: 07/12/2018 / Date of Encounter: 07/15/2018, 7:45 AM   Subjective: Patient feeling well this am no significant cardiac sx Cardioversion of afib to NSR this am without complication and feeling better  Review of Systems: Positive DJT:TSVX Negative for: Vision change, hearing change, syncope, dizziness, nausea, vomiting,diarrhea, bloody stool, stomach pain, cough, congestion, diaphoresis, urinary frequency, urinary pain,skin lesions, skin rashes Others previously listed  Objective: Telemetry: nsr Physical Exam: Blood pressure (!) 144/92, pulse (!) 57, temperature 98.1 F (36.7 C), temperature source Oral, resp. rate (!) 23, height 5\' 3"  (1.6 m), weight 108.3 kg, SpO2 94 %. Body mass index is 42.28 kg/m. General: Well developed, well nourished, in no acute distress. Head: Normocephalic, atraumatic, sclera non-icteric, no xanthomas, nares are without discharge. Neck: No apparent masses Lungs: Normal respirations with no wheezes, no rhonchi, no rales , no crackles   Heart: Regular rate and rhythm, normal S1 S2, no murmur, no rub, no gallop, PMI is normal size and placement, carotid upstroke normal without bruit, jugular venous pressure normal Abdomen: Soft, non-tender, non-distended with normoactive bowel sounds. No hepatosplenomegaly. Abdominal aorta is normal size without bruit Extremities: No edema, no clubbing, no cyanosis, no ulcers,  Peripheral: 2+ radial, 2+ femoral, 2+ dorsal pedal pulses Neuro: Alert and oriented. Moves all extremities spontaneously. Psych:  Responds to questions appropriately with a normal affect.   Intake/Output Summary (Last 24 hours) at 07/15/2018 0745 Last data filed at 07/15/2018 0457 Gross per 24 hour  Intake -  Output 1300 ml  Net -1300 ml    Inpatient Medications:  . [MAR Hold] amiodarone  200 mg Oral BID  . [MAR Hold] amLODipine  5 mg Oral Daily  . [MAR Hold]  apixaban  5 mg Oral BID  . [MAR Hold] atorvastatin  40 mg Oral Daily  . [MAR Hold] calcitRIOL  0.25 mcg Oral Daily  . [MAR Hold] Chlorhexidine Gluconate Cloth  6 each Topical Q0600  . [MAR Hold] DULoxetine  30 mg Oral Daily  . [MAR Hold] insulin aspart  0-15 Units Subcutaneous TID WC  . [MAR Hold] insulin aspart  0-5 Units Subcutaneous QHS  . [MAR Hold] insulin glargine  25 Units Subcutaneous QHS  . [MAR Hold] levothyroxine  88 mcg Oral QAC breakfast  . [MAR Hold] metoprolol succinate  50 mg Oral BID  . [MAR Hold] mupirocin ointment  1 application Nasal BID  . [MAR Hold] sodium chloride flush  3 mL Intravenous Q12H  . [MAR Hold] torsemide  20 mg Oral Daily   Infusions:  . [MAR Hold] sodium chloride    . sodium chloride 75 mL/hr at 07/15/18 0559    Labs: Recent Labs    07/14/18 0254 07/15/18 0257  NA 139 139  K 4.5 4.4  CL 104 104  CO2 25 25  GLUCOSE 195* 137*  BUN 52* 55*  CREATININE 3.27* 3.39*  CALCIUM 8.5* 8.5*   No results for input(s): AST, ALT, ALKPHOS, BILITOT, PROT, ALBUMIN in the last 72 hours. Recent Labs    07/12/18 2203  WBC 11.2*  HGB 12.0  HCT 40.2  MCV 87.0  PLT 228   Recent Labs    07/12/18 2203  TROPONINI <0.03   Invalid input(s): POCBNP No results for input(s): HGBA1C in the last 72 hours.   Weights: Filed Weights   07/13/18 0500 07/14/18 0401 07/15/18 0457  Weight: 106.8 kg 107.3 kg 108.3 kg     Radiology/Studies:  Dg  Chest 2 View  Result Date: 07/12/2018 CLINICAL DATA:  Shortness of breath EXAM: CHEST - 2 VIEW COMPARISON:  07/07/2015 FINDINGS: Cardiac shadow is mildly prominent. Aortic calcifications are seen. Postsurgical changes noted on the left in the subclavicular region. Cervical spine surgical changes are noted as well. Increased vascular congestion is noted with mild interstitial edema consistent with CHF. 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 No bony abnormality is noted. IMPRESSION: Mild CHF. Electronically Signed   By: Inez Catalina M.D.   On: 07/12/2018 22:41   US Renal  Result Date: 07/13/2018 CLINICAL DATA:  Acute kidney injury. Chronic kidney disease stage 3/4. EXAM: RENAL / URINARY TRACT ULTRASOUND COMPLETE COMPARISON:  Renal ultrasound 07/16/2015. Abdominopelvic CT 02/21/2015. FINDINGS: Right Kidney: Renal measurements: 9.1 x 3.6 x 5.1 cm = volume: 87.0 mL. There is cortical thinning with increased cortical echogenicity. No focal lesion or hydronephrosis. Left Kidney: Renal measurements: 9.5 x 3.9 x 3.5 cm = volume: 67.5 mL. There is cortical thinning with increased cortical echogenicity. No focal lesion or hydronephrosis. Bladder: Appears normal for degree of bladder distention. IMPRESSION: 1. Bilateral renal cortical thinning and increased echogenicity consistent with chronic medical renal disease. The right kidney measures slightly smaller than on the previous study. 2. No hydronephrosis. Electronically Signed   By: Richardean Sale M.D.   On: 07/13/2018 14:07     Assessment and Recommendation  68 y.o. female with htn and PAF now converted to NSR and feeling well 1. Continue anticoagulaiton 2. No change in current meds for afib and heart rate and rhytthm control 3. Ok for dc to home from cardiac standpoint and fu next week  Signed, Serafina Royals M.D. FACC

## 2018-07-15 NOTE — CV Procedure (Signed)
Electrical Cardioversion Procedure Note LEDORA DELKER [id number] Jul 26, 1950  Procedure: Electrical Cardioversion Indications:  Paroxysmal non valvular atrial fibrillation  Procedure Details Consent: Risks of procedure as well as the alternatives and risks of each were explained to the (patient/caregiver).  Consent for procedure obtained. Time Out: Verified patient identification, verified procedure, site/side was marked, verified correct patient position, special equipment/implants available, medications/allergies/relevent history reviewed, required imaging and test results available.  Performed  Patient placed on cardiac monitor, pulse oximetry, supplemental oxygen as necessary.  Sedation given: Propofol and versed as per anesthesia  Pacer pads placed anterior and posterior chest.  Cardioverted 1 time(s).  Cardioverted at 120J.  Evaluation Findings: Post procedure EKG shows: NSR Complications: None Patient did tolerate procedure well.   Serafina Royals M.D. [facility] 07/15/2018, 7:40 AM

## 2018-07-15 NOTE — Progress Notes (Signed)
Pt returns from cardioversion now in NSR.   VSS.  I will continue to assess.

## 2018-07-15 NOTE — Anesthesia Post-op Follow-up Note (Signed)
Anesthesia QCDR form completed.        

## 2018-07-15 NOTE — Discharge Summary (Signed)
[facility] NAME: [name]    MR#:  [id number]  DATE OF BIRTH:  26-Aug-1950  DATE OF ADMISSION:  07/12/2018 ADMITTING PHYSICIAN: Sela Hua, MD  DATE OF DISCHARGE: 07/15/2018 12:15 PM  PRIMARY CARE PHYSICIAN: Adin Hector, MD    ADMISSION DIAGNOSIS:  SOB (shortness of breath) [R06.02] Hypoxia [R09.02] Congestive heart failure, unspecified HF chronicity, unspecified heart failure type ([place]) [I50.9]  DISCHARGE DIAGNOSIS:  Active Problems:   Acute respiratory failure with hypoxia ([name])   SECONDARY DIAGNOSIS:   Past Medical History:  Diagnosis Date  . Anemia   . Anxiety   . Aortic valve stenosis   . Arthritis    feet, legs  . B12 deficiency   . Bowel obstruction ([place])   . CKD (chronic kidney disease)    protein in urine  . Colostomy in place [facility])   . Diabetes mellitus without complication ([place])   . Diverticulitis large intestine   . Dysrhythmia   . Ectopic atrial tachycardia ([place])   . FSGS (focal segmental glomerulosclerosis)   . Gastritis   . GI bleed   . Heart murmur   . Hyperlipidemia   . Hypertension   . Hypothyroidism   . Hypothyroidism    unspecified  . MRSA (methicillin resistant Staphylococcus aureus)    at abdominal wound.  Jan 2017.  Treated.   . Neuropathy   . Neuropathy in diabetes ([place])   . Obesity   . Pedal edema   . Psoriasis   . Shortness of breath dyspnea   . Vertigo     HOSPITAL COURSE:   1.  Acute hypoxic respiratory failure.  The patient required oxygen supplementation during the hospital course for hypoxia.  Oxygen was tapered off.  Patient no longer requires oxygen. 2.  Acute on chronic systolic congestive heart failure.  Outpatient echocardiogram showed an EF of 25%.  The patient was on IV Lasix and that was held because of creatinine rising.  I started oral torsemide.  I prescribed 20 mg daily in order to manage fluid.  I told her if she gains 3 pounds in 1 day or 5 pounds  in 1 week she will go up to 40 mg daily.  ACE inhibitor and Spironolactone contraindicated with elevation of creatinine at this point.  The patient is on metoprolol. 3.  Acute kidney injury on chronic kidney disease stage IV.  I did speak with Dr. Juleen China while the patient was in the hospital.  Her baseline creatinine in May was 3.05.  Creatinine here in the hospital 3.39 upon discharge.  Recommend a follow-up BMP as outpatient.  Consideration for ACE inhibitor as per nephrology. 4.  Atrial fibrillation.  The patient had a cardioversion that was electively scheduled today.  She stayed in the hospital and was cardioverted into normal sinus rhythm.  She was in normal sinus rhythm upon discharge.  She is on Eliquis for anticoagulation and metoprolol and amiodarone for rate control. 5.  Hypertension.  Continue usual medications 6.  Hyperlipidemia unspecified on atorvastatin 7.  Type 2 diabetes mellitus.  Decrease dose of Lantus and short acting insulin secondary to rising creatinine.  Continue to watch as outpatient closely.   DISCHARGE CONDITIONS:   Satisfactory  CONSULTS OBTAINED:  Cardiology  DRUG ALLERGIES:   Allergies  Allergen Reactions  . Buspirone Other (See Comments)    Weakness  . Citalopram Other (See Comments)  . Lisinopril Cough  . Metformin Diarrhea  At 1000 mg dose  . Pravastatin Other (See Comments)    insomnia  . Sitagliptin Other (See Comments)    constipation  . Tramadol Itching  . Diltiazem Hcl Palpitations  . Gabapentin Palpitations  . Hydralazine Rash  . Lovastatin Palpitations    DISCHARGE MEDICATIONS:   Allergies as of 07/15/2018      Reactions   Buspirone Other (See Comments)   Weakness   Citalopram Other (See Comments)   Lisinopril Cough   Metformin Diarrhea   At 1000 mg dose   Pravastatin Other (See Comments)   insomnia   Sitagliptin Other (See Comments)   constipation   Tramadol Itching   Diltiazem Hcl Palpitations   Gabapentin Palpitations    Hydralazine Rash   Lovastatin Palpitations      Medication List    STOP taking these medications   furosemide 20 MG tablet Commonly known as:  LASIX     TAKE these medications   amiodarone 200 MG tablet Commonly known as:  PACERONE Take 200 mg by mouth 2 (two) times daily.   amLODipine 5 MG tablet Commonly known as:  NORVASC Take 1 tablet (5 mg total) by mouth daily.   atorvastatin 40 MG tablet Commonly known as:  LIPITOR Take 40 mg by mouth daily.   calcitRIOL 0.25 MCG capsule Commonly known as:  ROCALTROL Take 0.25 mcg by mouth daily.   clobetasol cream 0.05 % Commonly known as:  TEMOVATE Apply 1 application topically every other day.   DULoxetine 30 MG capsule Commonly known as:  CYMBALTA Take 30 mg by mouth daily.   Dupixent 300 MG/2ML Sosy Generic drug:  Dupilumab Inject 300 mg into the skin every 14 (fourteen) days.   Eliquis 5 MG Tabs tablet Generic drug:  apixaban Take 5 mg by mouth 2 (two) times daily.   HumaLOG KwikPen 100 UNIT/ML KwikPen Generic drug:  insulin lispro 6 units subcutaneous injection prior to meals What changed:    how much to take  how to take this  when to take this  additional instructions   hydrocortisone 25 MG suppository Commonly known as:  ANUSOL-HC Place 25 mg rectally 2 (two) times daily as needed for hemorrhoids.   Lantus SoloStar 100 UNIT/ML Solostar Pen Generic drug:  Insulin Glargine Inject 25 Units into the skin at bedtime. What changed:  how much to take   levothyroxine 88 MCG tablet Commonly known as:  SYNTHROID Take 88 mcg by mouth daily before breakfast.   metoprolol succinate 50 MG 24 hr tablet Commonly known as:  TOPROL-XL Take 50 mg by mouth 2 (two) times daily.   ONE TOUCH ULTRA TEST test strip Generic drug:  glucose blood TEST TWICE DAILY   OneTouch Delica Lancets 20N Misc TEST TWICE DAILY   oxyCODONE 5 MG immediate release tablet Commonly known as:  Oxy IR/ROXICODONE Take 1 tablet (5  mg total) by mouth every 8 (eight) hours as needed for up to 30 days for severe pain. Start taking on:  August 07, 2018 What changed:  Another medication with the same name was removed. Continue taking this medication, and follow the directions you see here.   torsemide 20 MG tablet Commonly known as:  DEMADEX Take one tab po daily (can take another pill if short of breath or gains 3 lbs in one day or 5lbs in one week)   Vitamin D (Ergocalciferol) 1.25 MG (50000 UT) Caps capsule Commonly known as:  DRISDOL Take 50,000 Units by mouth every Tuesday.  DISCHARGE INSTRUCTIONS:   Follow-up PMD 5 days Follow-up nephrology 1 to 2 weeks Follow-up cardiology 1 to 2 weeks  If you experience worsening of your admission symptoms, develop shortness of breath, life threatening emergency, suicidal or homicidal thoughts you must seek medical attention immediately by calling 911 or calling your MD immediately  if symptoms less severe.  You Must read complete instructions/literature along with all the possible adverse reactions/side effects for all the Medicines you take and that have been prescribed to you. Take any new Medicines after you have completely understood and accept all the possible adverse reactions/side effects.   Please note  You were cared for by a hospitalist during your hospital stay. If you have any questions about your discharge medications or the care you received while you were in the hospital after you are discharged, you can call the unit and asked to speak with the hospitalist on call if the hospitalist that took care of you is not available. Once you are discharged, your primary care physician will handle any further medical issues. Please note that NO REFILLS for any discharge medications will be authorized once you are discharged, as it is imperative that you return to your primary care physician (or establish a relationship with a primary care physician if you do not have  one) for your aftercare needs so that they can reassess your need for medications and monitor your lab values.    Today   CHIEF COMPLAINT:   Chief Complaint  Patient presents with  . Shortness of Breath    HISTORY OF PRESENT ILLNESS:  [name]  is a 68 y.o. female came in with shortness of breath.   VITAL SIGNS:  Blood pressure 136/80, pulse 67, temperature 98.5 F (36.9 C), temperature source Oral, resp. rate 18, height 5\' 3"  (1.6 m), weight 108.3 kg, SpO2 94 %.  I/O:    Intake/Output Summary (Last 24 hours) at 07/15/2018 1613 Last data filed at 07/15/2018 0457 Gross per 24 hour  Intake -  Output 1300 ml  Net -1300 ml    PHYSICAL EXAMINATION:  GENERAL:  68 y.o.-year-old patient lying in the bed with no acute distress.  EYES: Pupils equal, round, reactive to light and accommodation. No scleral icterus. Extraocular muscles intact.  HEENT: Head atraumatic, normocephalic. Oropharynx and nasopharynx clear.  NECK:  Supple, no jugular venous distention. No thyroid enlargement, no tenderness.  LUNGS: Normal breath sounds bilaterally, no wheezing, rales,rhonchi or crepitation. No use of accessory muscles of respiration.  CARDIOVASCULAR: S1, S2 normal. No murmurs, rubs, or gallops.  ABDOMEN: Soft, non-tender, non-distended. Bowel sounds present. No organomegaly or mass.  EXTREMITIES: Trace pedal edema, no cyanosis, or clubbing.  NEUROLOGIC: Cranial nerves II through XII are intact. Muscle strength 5/5 in all extremities. Sensation intact. Gait not checked.  PSYCHIATRIC: The patient is alert and oriented x 3.  SKIN: No obvious rash, lesion, or ulcer.   DATA REVIEW:   CBC Recent Labs  Lab 07/12/18 2203  WBC 11.2*  HGB 12.0  HCT 40.2  PLT 228    Chemistries  Recent Labs  Lab 07/15/18 0257  NA 139  K 4.4  CL 104  CO2 25  GLUCOSE 137*  BUN 55*  CREATININE 3.39*  CALCIUM 8.5*    Cardiac Enzymes Recent Labs  Lab 07/12/18 2203  TROPONINI <0.03     Microbiology Results  Results for orders placed or performed during the hospital encounter of 07/12/18  MRSA PCR Screening     Status: Abnormal  Collection Time: 07/13/18  2:01 AM  Result Value Ref Range Status   MRSA by PCR POSITIVE (A) NEGATIVE Final    Comment:        The GeneXpert MRSA Assay (FDA approved for NASAL specimens only), is one component of a comprehensive MRSA colonization surveillance program. It is not intended to diagnose MRSA infection nor to guide or monitor treatment for MRSA infections. RESULT CALLED TO, READ BACK BY AND VERIFIED WITH: TAMARA CONNERS AT 0408 ON 07/13/2018 JJB Performed at [facility], [address] [place],  [address]      Management plans discussed with the patient, and she is in agreement.  CODE STATUS:  Code Status History    Date Active Date Inactive Code Status Order ID Comments User Context   07/13/2018 0144 07/15/2018 1555 Full Code [id number]  Sela Hua, MD Inpatient   07/13/2015 1144 07/22/2015 1620 Full Code [id number]  Lynnell Jude, RN Inpatient   02/21/2015 1453 03/13/2015 2115 Full Code [id number]  Clayburn Pert, MD Inpatient   02/04/2015 0726 02/15/2015 1949 Full Code [id number]  Sherri Rad, MD Inpatient      TOTAL TIME TAKING CARE OF THIS PATIENT: 35 minutes.    Loletha Grayer M.D on 07/15/2018 at 4:13 PM  Between 7am to 6pm - Pager - [phone]  After 6pm go to [email or web address] - password [email or web address] [facility]  [phone]  CC: Primary care physician; Adin Hector, MD

## 2018-07-15 NOTE — Transfer of Care (Signed)
Immediate Anesthesia Transfer of Care Note  Patient: [name]  Procedure(s) Performed: CARDIOVERSION (N/A )  Patient Location: PACU  Anesthesia Type:General  Level of Consciousness: sedated  Airway & Oxygen Therapy: Patient Spontanous Breathing and Patient connected to nasal cannula oxygen  Post-op Assessment: Report given to RN and Post -op Vital signs reviewed and stable  Post vital signs: Reviewed and stable  Last Vitals:  Vitals Value Taken Time  BP 144/92 07/15/2018  7:39 AM  Temp    Pulse 57 07/15/2018  7:39 AM  Resp 23 07/15/2018  7:39 AM  SpO2 94 % 07/15/2018  7:39 AM    Last Pain:  Vitals:   07/15/18 0739  TempSrc:   PainSc: 0-No pain      Patients Stated Pain Goal: 0 (01/60/10 9323)  Complications: No apparent anesthesia complications

## 2018-07-15 NOTE — Anesthesia Postprocedure Evaluation (Signed)
Anesthesia Post Note  Patient: [name]  Procedure(s) Performed: CARDIOVERSION (N/A )  Patient location during evaluation: PACU Anesthesia Type: General Level of consciousness: awake and alert Pain management: pain level controlled Vital Signs Assessment: post-procedure vital signs reviewed and stable Respiratory status: spontaneous breathing, nonlabored ventilation, respiratory function stable and patient connected to nasal cannula oxygen Cardiovascular status: blood pressure returned to baseline and stable Postop Assessment: no apparent nausea or vomiting Anesthetic complications: no     Last Vitals:  Vitals:   07/15/18 0815 07/15/18 0834  BP: 134/89 136/80  Pulse: 66 67  Resp: (!) 23 18  Temp:  36.9 C  SpO2: 95% 94%    Last Pain:  Vitals:   07/15/18 1024  TempSrc:   PainSc: [place]

## 2018-07-20 ENCOUNTER — Telehealth: Payer: Self-pay

## 2018-07-20 ENCOUNTER — Other Ambulatory Visit: Payer: Self-pay

## 2018-07-20 ENCOUNTER — Ambulatory Visit: Payer: Medicare Other | Attending: Family | Admitting: Family

## 2018-07-20 DIAGNOSIS — I10 Essential (primary) hypertension: Secondary | ICD-10-CM

## 2018-07-20 DIAGNOSIS — I5022 Chronic systolic (congestive) heart failure: Secondary | ICD-10-CM

## 2018-07-20 DIAGNOSIS — Z794 Long term (current) use of insulin: Secondary | ICD-10-CM

## 2018-07-20 DIAGNOSIS — E1122 Type 2 diabetes mellitus with diabetic chronic kidney disease: Secondary | ICD-10-CM

## 2018-07-20 NOTE — Progress Notes (Signed)
Virtual Visit via Telephone Note    Evaluation Performed:  Initial visit  This visit type was conducted due to national recommendations for restrictions regarding the COVID-19 Pandemic (e.g. social distancing).  This format is felt to be most appropriate for this patient at this time.  All issues noted in this document were discussed and addressed.  No physical exam was performed (except for noted visual exam findings with Video Visits).  Please refer to the patient's chart (MyChart message for video visits and phone note for telephone visits) for the patient's consent to telehealth for [facility]  Date:  07/20/2018   ID:  [name], DOB 09/19/1950, MRN [id number]  Patient Location:  [place] [address]   Provider location:   [facility] [place] [address], [place] [address]  PCP:  Adin Hector, MD  Cardiologist: Serafina Royals, MD Electrophysiologist:  None   Chief Complaint:  Shortness of breath  History of Present Illness:    [name] is a 69 y.o. female who presents via audio/video conferencing for a telehealth visit today.  Patient verified DOB and address.  The patient does not have symptoms concerning for COVID-19 infection (fever, chills, cough, or new SHORTNESS OF BREATH).   Patient reports minimal shortness of breath upon moderate exertion. She says that this has been present for several months. She has associated chronic difficulty sleeping and fatigue along with this. She denies any dizziness, swelling in her legs/ abdomen, palpitations or chest pain. Currently not weighing daily as she hasn't received her scales that she's ordered yet.   Prior CV studies:   The following studies were reviewed today:  Echo report from 06/24/2018 reviewed and showed an EF of 25% along with moderate MR/TR.   Past Medical History:  Diagnosis Date  . Anemia   . Anxiety   . Aortic valve stenosis   . Arthritis    feet, legs  . B12 deficiency   . Bowel obstruction ([place])   . CKD (chronic kidney disease)    protein in urine  . Colostomy in place [facility])   . Diabetes mellitus without complication ([place])   . Diverticulitis large intestine   . Dysrhythmia   . Ectopic atrial tachycardia ([place])   . FSGS (focal segmental glomerulosclerosis)   . Gastritis   . GI bleed   . Heart murmur   . Hyperlipidemia   . Hypertension   . Hypothyroidism   . Hypothyroidism    unspecified  . MRSA (methicillin resistant Staphylococcus aureus)    at abdominal wound.  Jan 2017.  Treated.   . Neuropathy   . Neuropathy in diabetes ([place])   . Obesity   . Pedal edema   . Psoriasis   . Shortness of breath dyspnea   . Vertigo    Past Surgical History:  Procedure Laterality Date  . ABDOMINAL HYSTERECTOMY    . APPLICATION OF WOUND VAC N/A 02/24/2015   Procedure: APPLICATION OF WOUND VAC;  Surgeon: Clayburn Pert, MD;  Location: [facility];  Service: General;  Laterality: N/A;  . APPLICATION OF WOUND VAC N/A 02/26/2015   Procedure: APPLICATION OF WOUND VAC;  Surgeon: Clayburn Pert, MD;  Location: [facility];  Service: General;  Laterality: N/A;  . BACK SURGERY     spur frmoved from lower back  . CARDIAC CATHETERIZATION    . CARDIAC VALVE REPLACEMENT  08/2017   [facility]  . CARDIOVERSION N/A 07/15/2018  Procedure: CARDIOVERSION;  Surgeon: Corey Skains, MD;  Location: [facility];  Service: Cardiovascular;  Laterality: N/A;  . COLECTOMY    . COLONOSCOPY WITH PROPOFOL N/A 09/04/2016   Procedure: COLONOSCOPY WITH PROPOFOL;  Surgeon: Manya Silvas, MD;  Location: [facility];  Service: Endoscopy;  Laterality: N/A;  . COLOSTOMY REVERSAL N/A 07/12/2015   Procedure: COLOSTOMY REVERSAL;  Surgeon: Clayburn Pert, MD;  Location: [facility];  Service: General;  Laterality: N/A;  . DEBRIDEMENT OF ABDOMINAL WALL ABSCESS N/A 02/22/2015   Procedure: DEBRIDEMENT OF ABDOMINAL WALL ABSCESS;  Surgeon: Clayburn Pert, MD;  Location: [facility];   Service: General;  Laterality: N/A;  . EXCISION MASS ABDOMINAL N/A 02/24/2015   Procedure: EXCISION MASS ABDOMINAL  / [place];  Surgeon: Clayburn Pert, MD;  Location: [facility];  Service: General;  Laterality: N/A;  . EXCISION MASS ABDOMINAL N/A 02/26/2015   Procedure: EXCISION MASS ABDOMINAL/wash out;  Surgeon: Clayburn Pert, MD;  Location: [facility];  Service: General;  Laterality: N/A;  . FLEXIBLE SIGMOIDOSCOPY  06/19/2015   Procedure: FLEXIBLE SIGMOIDOSCOPY;  Surgeon: Lucilla Lame, MD;  Location: [facility];  Service: Endoscopy;;  UNABLE TO ACCESS OSTOMY SITE FOR ACCESS INTO COLON  . LAPAROSCOPY  07/12/2015   Procedure: LAPAROSCOPY DIAGNOSTIC;  Surgeon: Clayburn Pert, MD;  Location: [facility];  Service: General;;  . LAPAROTOMY  07/12/2015   Procedure: EXPLORATORY LAPAROTOMY;  Surgeon: Clayburn Pert, MD;  Location: [facility];  Service: General;;  . LAPAROTOMY N/A 02/06/2015   Procedure: Laparotomy, reduction of incarcerated parastomal hernia, repair of parastomal hernia with mesh;  Surgeon: Sherri Rad, MD;  Location: [facility];  Service: General;  Laterality: N/A;  . laparotomy closure of cecal perforation  05/09/2013   Dr. Marina Gravel  . LYSIS OF ADHESION  07/12/2015   Procedure: LYSIS OF ADHESION;  Surgeon: Clayburn Pert, MD;  Location: [facility];  Service: General;;  . TONSILLECTOMY    . WOUND DEBRIDEMENT N/A 03/03/2015   Procedure: DEBRIDEMENT ABDOMINAL WOUND;  Surgeon: Florene Glen, MD;  Location: [facility];  Service: General;  Laterality: N/A;  . WOUND DEBRIDEMENT N/A 03/09/2015   Procedure: DEBRIDEMENT ABDOMINAL WOUND;  Surgeon: Florene Glen, MD;  Location: [facility];  Service: General;  Laterality: N/A;     Current Meds  Medication Sig  . amiodarone (PACERONE) 200 MG tablet Take 200 mg by mouth 2 (two) times daily.  Marland Kitchen amLODipine (NORVASC) 5 MG tablet Take 1 tablet (5 mg total) by mouth daily.  Marland Kitchen apixaban (ELIQUIS) 5 MG TABS tablet Take 5 mg by mouth 2 (two) times daily.  Marland Kitchen  atorvastatin (LIPITOR) 40 MG tablet Take 40 mg by mouth daily.   . calcitRIOL (ROCALTROL) 0.25 MCG capsule Take 0.25 mcg by mouth daily.  . DULoxetine (CYMBALTA) 30 MG capsule Take 30 mg by mouth daily.   . DUPIXENT 300 MG/2ML SOSY Inject 300 mg into the skin every 14 (fourteen) days.  Marland Kitchen glucose blood (ONE TOUCH ULTRA TEST) test strip TEST TWICE DAILY  . HUMALOG KWIKPEN 100 UNIT/ML KwikPen 6 units subcutaneous injection prior to meals  . LANTUS SOLOSTAR 100 UNIT/ML Solostar Pen Inject 25 Units into the skin at bedtime.  Marland Kitchen levothyroxine (SYNTHROID, LEVOTHROID) 88 MCG tablet Take 88 mcg by mouth daily before breakfast.   . metoprolol succinate (TOPROL-XL) 50 MG 24 hr tablet Take 50 mg by mouth daily.   Glory Rosebush DELICA LANCETS 18E MISC TEST TWICE DAILY  . torsemide (DEMADEX) 20 MG tablet Take one tab po daily (can  take another pill if short of breath or gains 3 lbs in one day or 5lbs in one week)  . Vitamin D, Ergocalciferol, (DRISDOL) 1.25 MG (50000 UT) CAPS capsule Take 50,000 Units by mouth every Tuesday.     Allergies:   Buspirone; Citalopram; Lisinopril; Metformin; Pravastatin; Sitagliptin; Tramadol; Diltiazem hcl; Gabapentin; Hydralazine; and Lovastatin   Social History   Tobacco Use  . Smoking status: Former Smoker    Types: Cigarettes    Last attempt to quit: 04/18/2013    Years since quitting: 5.2  . Smokeless tobacco: Never Used  Substance Use Topics  . Alcohol use: No    Alcohol/week: 0.0 standard drinks  . Drug use: No     Family Hx: [name] family history includes CAD in her father; Colon cancer in her brother; Diabetes in her mother; Heart attack in her father; Hypertension in her father.  ROS:   Please see the history of present illness.     All other systems reviewed and are negative.   Labs/Other Tests and Data Reviewed:    Recent Labs: 07/24/2017: ALT 14 07/12/2018: Hemoglobin 12.0; Platelets 228 07/13/2018: B Natriuretic Peptide 307.0 07/15/2018: BUN 55;  Creatinine, Ser 3.39; Potassium 4.4; Sodium 139   Recent Lipid Panel No results found for: CHOL, TRIG, HDL, CHOLHDL, LDLCALC, LDLDIRECT  Wt Readings from Last 3 Encounters:  07/15/18 238 lb 11.2 oz (108.3 kg)  04/09/18 230 lb (104.3 kg)  12/29/17 225 lb (102.1 kg)     Exam:    Vital Signs:  There were no vitals taken for this visit.   Well nourished, well developed female in no  acute distress.   ASSESSMENT & PLAN:    1. Chronic heart failure with reduced ejection fraction- - NYHA class II - euvolemic today based on patient's description of symptoms - waiting on her scales to come that she's ordered; instructed to weigh daily and call for an overnight weight gain of >2 pounds or a weekly weight gain of >5 pounds - not adding salt to her food and has been reading food labels for sodium content. Instructed to keep daily sodium intake to 2000mg  sodium/ day - consider titrating up metoprolol at future visits when BP can be checked more frequently - doubtful renal function can tolerate entresto - saw cardiology Nehemiah Massed) 07/06/2018 - BNP 07/13/2018 was 307.0  2: HTN- - not checking her BP at home - saw PCP Caryl Comes) 06/16/2018 & returns later this week - BMP from 07/15/2018 reviewed and showed sodium 139, potassium 4.4, creatinine 3.39 and GFR 13  3: DM- - A1c on 06/16/2018 was 8.6% - glucose at home has been running in the 80's - saw endocrinology ([facility]) 06/17/2018 - saw nephrology Holley Raring) a few weeks ago and had lab work drawn so will request those results  COVID-19 Education: The signs and symptoms of COVID-19 were discussed with the patient and how to seek care for testing (follow up with PCP or arrange E-visit).  The importance of social distancing was discussed today.  Patient Risk:   After full review of this patients clinical status, I feel that they are at least moderate risk at this time.  Time:   Today, I have spent 14 minutes with the patient with telehealth  technology discussing medications, diet and weight to report.      Medication Adjustments/Labs and Tests Ordered: Current medicines are reviewed at length with the patient today.  Concerns regarding medicines are outlined above.   Tests Ordered: No orders of  the defined types were placed in this encounter.  Medication Changes: No orders of the defined types were placed in this encounter.   Disposition:  Follow-up in 6 weeks or sooner for any questions/problems before then.   Signed, Alisa Graff, FNP  07/20/2018 11:42 AM    [facility]

## 2018-07-20 NOTE — Telephone Encounter (Signed)
TELEPHONE CALL NOTE  [name] has been deemed a candidate for a follow-up tele-health visit to limit community exposure during the Covid-19 pandemic. I spoke with the patient via phone to ensure availability of phone/video source, confirm preferred email & phone number, discuss instructions and expectations, and review consent.   I reminded [name] to be prepared with any vital sign and/or heart rhythm information that could potentially be obtained via home monitoring, at the time of her visit.  Finally, I reminded [name] to expect an e-mail containing a link for their video-based visit approximately 15 minutes before her visit, or alternatively, a phone call at the time of her visit if her visit is planned to be a phone encounter.  Did the patient verbally consent to treatment as below? YES   Gaylord Shih, CMA 07/20/2018 10:55 AM  CONSENT FOR TELE-HEALTH VISIT - PLEASE REVIEW  I hereby voluntarily request, consent and authorize [facility] and its employed or contracted physicians, physician assistants, nurse practitioners or other licensed health care professionals (the Practitioner), to provide me with telemedicine health care services (the "Services") as deemed necessary by the treating Practitioner. I acknowledge and consent to receive the Services by the Practitioner via telemedicine. I understand that the telemedicine visit will involve communicating with the Practitioner through telephonic communication technology and the disclosure of certain medical information by electronic transmission. I acknowledge that I have been given the opportunity to request an in-person assessment or other available alternative prior to the telemedicine visit and am voluntarily participating in the telemedicine visit.  I understand that I have the right to withhold or withdraw my consent to the use of telemedicine in the course of my care at any time, without affecting my  right to future care or treatment, and that the Practitioner or I may terminate the telemedicine visit at any time. I understand that I have the right to inspect all information obtained and/or recorded in the course of the telemedicine visit and may receive copies of available information for a reasonable fee.  I understand that some of the potential risks of receiving the Services via telemedicine include:  Marland Kitchen Delay or interruption in medical evaluation due to technological equipment failure or disruption; . Information transmitted may not be sufficient (e.g. poor resolution of images) to allow for appropriate medical decision making by the Practitioner; and/or  . In rare instances, security protocols could fail, causing a breach of personal health information.  Furthermore, I acknowledge that it is my responsibility to provide information about my medical history, conditions and care that is complete and accurate to the best of my ability. I acknowledge that Practitioner's advice, recommendations, and/or decision may be based on factors not within their control, such as incomplete or inaccurate data provided by me or lack of visual representation. I understand that the practice of medicine is not an exact science and that Practitioner makes no warranties or guarantees regarding treatment outcomes. I acknowledge that I will receive a copy of this consent concurrently upon execution via email to the email address I last provided but may also request a printed copy by calling the office of [facility].    I understand that my insurance may be billed for this visit.   I have read or had this consent read to me. . I understand the contents of this consent, which adequately explains the benefits and risks of the Services being provided via telemedicine.  Marland Kitchen  I have been provided ample opportunity to ask questions regarding this consent and the Services and have had my questions answered to my  satisfaction. . I give my informed consent for the services to be provided through the use of telemedicine in my medical care  By participating in this telemedicine visit I agree to the above.

## 2018-07-20 NOTE — Telephone Encounter (Signed)
   TELEPHONE CALL NOTE  This patient has been deemed a candidate for follow-up tele-health visit to limit community exposure during the Covid-19 pandemic. I spoke with the patient via phone to discuss instructions. The patient was advised to review the section on consent for treatment as well. The patient will receive a phone call 2-3 days prior to their E-Visit at which time consent will be verbally confirmed. A Virtual Office Visit appointment type has been scheduled for 07/20/2018 with Darylene Price FNP.  Vonda Antigua L, CMA 07/20/2018 10:55 AM

## 2018-07-20 NOTE — Patient Instructions (Addendum)
Begin weighing daily and call for an overnight weight gain of > 2 pounds or a weekly weight gain of >5 pounds.    Low-Sodium Eating Plan Sodium, which is an element that makes up salt, helps you maintain a healthy balance of fluids in your body. Too much sodium can increase your blood pressure and cause fluid and waste to be held in your body. Your health care provider or dietitian may recommend following this plan if you have high blood pressure (hypertension), kidney disease, liver disease, or heart failure. Eating less sodium can help lower your blood pressure, reduce swelling, and protect your heart, liver, and kidneys. What are tips for following this plan? General guidelines  Most people on this plan should limit their sodium intake to 2,000 mg (milligrams) of sodium each day. Reading food labels   The Nutrition Facts label lists the amount of sodium in one serving of the food. If you eat more than one serving, you must multiply the listed amount of sodium by the number of servings.  Choose foods with less than 140 mg of sodium per serving.  Avoid foods with 300 mg of sodium or more per serving. Shopping  Look for lower-sodium products, often labeled as "low-sodium" or "no salt added."  Always check the sodium content even if foods are labeled as "unsalted" or "no salt added".  Buy fresh foods. ? Avoid canned foods and premade or frozen meals. ? Avoid canned, cured, or processed meats  Buy breads that have less than 80 mg of sodium per slice. Cooking  Eat more home-cooked food and less restaurant, buffet, and fast food.  Avoid adding salt when cooking. Use salt-free seasonings or herbs instead of table salt or sea salt. Check with your health care provider or pharmacist before using salt substitutes.  Cook with plant-based oils, such as canola, sunflower, or olive oil. Meal planning  When eating at a restaurant, ask that your food be prepared with less salt or no salt, if  possible.  Avoid foods that contain MSG (monosodium glutamate). MSG is sometimes added to [place] food, bouillon, and some canned foods. What foods are recommended? The items listed may not be a complete list. Talk with your dietitian about what dietary choices are best for you. Grains Low-sodium cereals, including oats, puffed wheat and rice, and shredded wheat. Low-sodium crackers. Unsalted rice. Unsalted pasta. Low-sodium bread. Whole-grain breads and whole-grain pasta. Vegetables Fresh or frozen vegetables. "No salt added" canned vegetables. "No salt added" tomato sauce and paste. Low-sodium or reduced-sodium tomato and vegetable juice. Fruits Fresh, frozen, or canned fruit. Fruit juice. Meats and other protein foods Fresh or frozen (no salt added) meat, poultry, seafood, and fish. Low-sodium canned tuna and salmon. Unsalted nuts. Dried peas, beans, and lentils without added salt. Unsalted canned beans. Eggs. Unsalted nut butters. Dairy Milk. Soy milk. Cheese that is naturally low in sodium, such as ricotta cheese, fresh mozzarella, or Swiss cheese Low-sodium or reduced-sodium cheese. Cream cheese. Yogurt. Fats and oils Unsalted butter. Unsalted margarine with no trans fat. Vegetable oils such as canola or olive oils. Seasonings and other foods Fresh and dried herbs and spices. Salt-free seasonings. Low-sodium mustard and ketchup. Sodium-free salad dressing. Sodium-free light mayonnaise. Fresh or refrigerated horseradish. Lemon juice. Vinegar. Homemade, reduced-sodium, or low-sodium soups. Unsalted popcorn and pretzels. Low-salt or salt-free chips. What foods are not recommended? The items listed may not be a complete list. Talk with your dietitian about what dietary choices are best for you. Grains Instant  hot cereals. Bread stuffing, pancake, and biscuit mixes. Croutons. Seasoned rice or pasta mixes. Noodle soup cups. Boxed or frozen macaroni and cheese. Regular salted crackers.  Self-rising flour. Vegetables Sauerkraut, pickled vegetables, and relishes. Olives. [place] fries. Onion rings. Regular canned vegetables (not low-sodium or reduced-sodium). Regular canned tomato sauce and paste (not low-sodium or reduced-sodium). Regular tomato and vegetable juice (not low-sodium or reduced-sodium). Frozen vegetables in sauces. Meats and other protein foods Meat or fish that is salted, canned, smoked, spiced, or pickled. Bacon, ham, sausage, hotdogs, corned beef, chipped beef, packaged lunch meats, salt pork, jerky, pickled herring, anchovies, regular canned tuna, sardines, salted nuts. Dairy Processed cheese and cheese spreads. Cheese curds. Blue cheese. Feta cheese. String cheese. Regular cottage cheese. Buttermilk. Canned milk. Fats and oils Salted butter. Regular margarine. Ghee. Bacon fat. Seasonings and other foods Onion salt, garlic salt, seasoned salt, table salt, and sea salt. Canned and packaged gravies. Worcestershire sauce. Tartar sauce. Barbecue sauce. Teriyaki sauce. Soy sauce, including reduced-sodium. Steak sauce. Fish sauce. Oyster sauce. Cocktail sauce. Horseradish that you find on the shelf. Regular ketchup and mustard. Meat flavorings and tenderizers. Bouillon cubes. Hot sauce and Tabasco sauce. Premade or packaged marinades. Premade or packaged taco seasonings. Relishes. Regular salad dressings. Salsa. Potato and tortilla chips. Corn chips and puffs. Salted popcorn and pretzels. Canned or dried soups. Pizza. Frozen entrees and pot pies. Summary  Eating less sodium can help lower your blood pressure, reduce swelling, and protect your heart, liver, and kidneys.  Most people on this plan should limit their sodium intake to 1,500-2,000 mg (milligrams) of sodium each day.  Canned, boxed, and frozen foods are high in sodium. Restaurant foods, fast foods, and pizza are also very high in sodium. You also get sodium by adding salt to food.  Try to cook at home, eat  more fresh fruits and vegetables, and eat less fast food, canned, processed, or prepared foods. This information is not intended to replace advice given to you by your health care provider. Make sure you discuss any questions you have with your health care provider. Document Released: 07/27/2001 Document Revised: 01/29/2016 Document Reviewed: 01/29/2016 Elsevier Interactive Patient Education  2019 [employer].

## 2018-08-24 DIAGNOSIS — G4733 Obstructive sleep apnea (adult) (pediatric): Secondary | ICD-10-CM | POA: Insufficient documentation

## 2018-08-28 NOTE — Progress Notes (Signed)
Patient ID: [name], female    DOB: [birth year], 68 y.o.   MRN: [id number]  HPI  Ms Dreyfuss is a 68 y/o female with a history of DM, hyperlipidemia, HTN, CKD, thyroid disease, GIB, aortic valve stenosis, anxiety, anemia, atrial fibrillation, prior tobacco use and chronic heart failure.   Echo report from 06/24/2018 reviewed and showed an EF of 25% along with moderate MR/TR.   Admitted 07/12/2018 due to acute on chronic heart failure. Initially needed IV lasix and then transitioned to oral diuretics. Initially needed oxygen and then was able to be weaned off of it. Nephrology following due to CKD. Cardioverted while hospitalized. Discharged after 3 days.   She presents today for a follow-up visit with a chief complaint of moderate fatigue upon minimal exertion. She describes this as chronic in nature having been present for several years. She has associated shortness of breath, palpitations, difficulty sleeping and blood in her stool. She denies any dizziness, abdominal distention, pedal edema, chest pain or cough. Hasn't been weighing herself every day.   Says that she's recently had a sleep study completed and is now waiting on CPAP equipment.   Past Medical History:  Diagnosis Date  . Anemia   . Anxiety   . Aortic valve stenosis   . Arrhythmia    atrial fibrillation  . Arthritis    feet, legs  . B12 deficiency   . Bowel obstruction ([place])   . CHF (congestive heart failure) ([place])   . CKD (chronic kidney disease)    protein in urine  . Colostomy in place [facility])   . Diabetes mellitus without complication ([place])   . Diverticulitis large intestine   . Dysrhythmia   . Ectopic atrial tachycardia ([place])   . FSGS (focal segmental glomerulosclerosis)   . Gastritis   . GI bleed   . Heart murmur   . Hyperlipidemia   . Hypertension   . Hypothyroidism   . Hypothyroidism    unspecified  . MRSA (methicillin resistant Staphylococcus aureus)    at abdominal wound.  Jan 2017.  Treated.   .  Neuropathy   . Neuropathy in diabetes ([place])   . Obesity   . Pedal edema   . Psoriasis   . Shortness of breath dyspnea   . Vertigo    Past Surgical History:  Procedure Laterality Date  . ABDOMINAL HYSTERECTOMY    . APPLICATION OF WOUND VAC N/A 02/24/2015   Procedure: APPLICATION OF WOUND VAC;  Surgeon: Clayburn Pert, MD;  Location: [facility] ORS;  Service: General;  Laterality: N/A;  . APPLICATION OF WOUND VAC N/A 02/26/2015   Procedure: APPLICATION OF WOUND VAC;  Surgeon: Clayburn Pert, MD;  Location: [facility];  Service: General;  Laterality: N/A;  . BACK SURGERY     spur frmoved from lower back  . CARDIAC CATHETERIZATION    . CARDIAC VALVE REPLACEMENT  08/2017   [facility]  . CARDIOVERSION N/A 07/15/2018   Procedure: CARDIOVERSION;  Surgeon: Corey Skains, MD;  Location: [facility];  Service: Cardiovascular;  Laterality: N/A;  . COLECTOMY    . COLONOSCOPY WITH PROPOFOL N/A 09/04/2016   Procedure: COLONOSCOPY WITH PROPOFOL;  Surgeon: Manya Silvas, MD;  [facility] ENDOSCOPY;  Service: Endoscopy;  Laterality: N/A;  . COLOSTOMY REVERSAL N/A 07/12/2015   Procedure: COLOSTOMY REVERSAL;  Surgeon: Clayburn Pert, MD;  Location: [facility] OR[facility];  Service: General;  Laterality: N/A;  . DEBRIDEMENT OF ABDOMINAL WALL ABSCESS N/A 02/22/2015   Procedure: DEBRIDEMENT OF ABDOMINAL WALL ABSCESS;  Surgeon: Clayburn Pert, MD;  Location: [facility];  Service: General;  Laterality: N/A;  . EXCISION MASS ABDOMINAL N/A 02/24/2015   Procedure: EXCISION MASS ABDOMINAL  / [place];  Surgeon: Clayburn Pert, MD;  Location: [facility];  Service: General;  Laterality: N/A;  . EXCISION MASS ABDOMINAL N/A 02/26/2015   Procedure: EXCISION MASS ABDOMINAL/wash out;  Surgeon: Clayburn Pert, MD;  Location: [facility];  Service: General;  Laterality: N/A;  . FLEXIBLE SIGMOIDOSCOPY  06/19/2015   Procedure: FLEXIBLE SIGMOIDOSCOPY;  Surgeon: Lucilla Lame, MD;  Location: [place];  Service: Endoscopy;;  UNABLE TO ACCESS OSTOMY  SITE FOR ACCESS INTO COLON  . LAPAROSCOPY  07/12/2015   Procedure: LAPAROSCOPY DIAGNOSTIC;  Surgeon: Clayburn Pert, MD;  Location: [facility];  Service: General;;  . LAPAROTOMY  07/12/2015   Procedure: EXPLORATORY LAPAROTOMY;  Surgeon: Clayburn Pert, MD;  Location: [facility];  Service: General;;  . LAPAROTOMY N/A 02/06/2015   Procedure: Laparotomy, reduction of incarcerated parastomal hernia, repair of parastomal hernia with mesh;  Surgeon: Sherri Rad, MD;  Location: [facility];  Service: General;  Laterality: N/A;  . laparotomy closure of cecal perforation  05/09/2013   Dr. Marina Gravel  . LYSIS OF ADHESION  07/12/2015   Procedure: LYSIS OF ADHESION;  Surgeon: Clayburn Pert, MD;  Location: [facility];  Service: General;;  . TONSILLECTOMY    . WOUND DEBRIDEMENT N/A 03/03/2015   Procedure: DEBRIDEMENT ABDOMINAL WOUND;  Surgeon: Florene Glen, MD;  Location: [facility];  Service: General;  Laterality: N/A;  . WOUND DEBRIDEMENT N/A 03/09/2015   Procedure: DEBRIDEMENT ABDOMINAL WOUND;  Surgeon: Florene Glen, MD;  Location: [facility];  Service: General;  Laterality: N/A;   Family History  Problem Relation Age of Onset  . Diabetes Mother   . Hypertension Father   . CAD Father   . Heart attack Father   . Colon cancer Brother    Social History   Tobacco Use  . Smoking status: Former Smoker    Types: Cigarettes    Quit date: 04/18/2013    Years since quitting: 5.3  . Smokeless tobacco: Never Used  Substance Use Topics  . Alcohol use: No    Alcohol/week: 0.0 standard drinks   Allergies  Allergen Reactions  . Buspirone Other (See Comments)    Weakness  . Citalopram Other (See Comments)  . Lisinopril Cough  . Metformin Diarrhea    At 1000 mg dose  . Pravastatin Other (See Comments)    insomnia  . Sitagliptin Other (See Comments)    constipation  . Tramadol Itching  . Diltiazem Hcl Palpitations  . Gabapentin Palpitations  . Hydralazine Rash  . Lovastatin Palpitations   Prior to Admission  medications   Medication Sig Start Date End Date Taking? Authorizing Provider  amiodarone (PACERONE) 200 MG tablet Take 200 mg by mouth 2 (two) times daily. 06/29/18  Yes [provider]  amLODipine (NORVASC) 5 MG tablet Take 1 tablet (5 mg total) by mouth daily. 07/22/15  Yes Florene Glen, MD  apixaban (ELIQUIS) 5 MG TABS tablet Take 5 mg by mouth 2 (two) times daily.   Yes [provider]  atorvastatin (LIPITOR) 40 MG tablet Take 40 mg by mouth daily.  04/19/15  Yes [provider]  calcitRIOL (ROCALTROL) 0.25 MCG capsule Take 0.25 mcg by mouth daily.   Yes [provider]  clobetasol cream (TEMOVATE) 3.50 % Apply 1 application topically every other day.  12/29/15  Yes [provider]  DULoxetine (CYMBALTA)  30 MG capsule Take 30 mg by mouth daily.  01/18/15  Yes [provider]  DUPIXENT 300 MG/2ML SOSY Inject 300 mg into the skin every 14 (fourteen) days. 05/28/18  Yes [provider]  glucose blood (ONE TOUCH ULTRA TEST) test strip TEST TWICE DAILY 09/06/14  Yes [provider]  HUMALOG KWIKPEN 100 UNIT/ML KwikPen 6 units subcutaneous injection prior to meals 07/15/18  Yes Wieting, Richard, MD  hydrocortisone (ANUSOL-HC) 25 MG suppository Place 25 mg rectally 2 (two) times daily as needed for hemorrhoids. 06/19/18  Yes [provider]  LANTUS SOLOSTAR 100 UNIT/ML Solostar Pen Inject 25 Units into the skin at bedtime. 07/15/18  Yes Wieting, Richard, MD  levothyroxine (SYNTHROID, LEVOTHROID) 88 MCG tablet Take 88 mcg by mouth daily before breakfast.  09/11/15  Yes [provider]  metoprolol succinate (TOPROL-XL) 50 MG 24 hr tablet Take 50 mg by mouth daily.  06/24/18  Yes [provider]  Jonetta Speak LANCETS 63K MISC TEST TWICE DAILY 09/06/14  Yes [provider]  oxyCODONE (OXY IR/ROXICODONE) 5 MG immediate release tablet Take 1 tablet (5 mg total) by mouth every 8 (eight) hours as needed for up  to 30 days for severe pain. 08/07/18 09/06/18 Yes Vevelyn Francois, NP  torsemide (DEMADEX) 20 MG tablet Take one tab po daily (can take another pill if short of breath or gains 3 lbs in one day or 5lbs in one week) 07/15/18  Yes Wieting, Richard, MD  Vitamin D, Ergocalciferol, (DRISDOL) 1.25 MG (50000 UT) CAPS capsule Take 50,000 Units by mouth every Tuesday.   Yes [provider]    Review of Systems  Constitutional: Positive for fatigue (easily). Negative for activity change.  HENT: Negative for congestion, rhinorrhea and sore throat.   Eyes: Negative.   Respiratory: Positive for shortness of breath (with minimal exertion). Negative for cough.   Cardiovascular: Positive for palpitations. Negative for chest pain and leg swelling.  Gastrointestinal: Positive for blood in stool (has appointment with GI). Negative for abdominal distention and abdominal pain.  Endocrine: Negative.   Genitourinary: Negative.   Musculoskeletal: Positive for back pain (at times). Negative for neck pain.  Skin: Negative.   Allergic/Immunologic: Negative.   Neurological: Negative for dizziness and light-headedness.  Hematological: Negative for adenopathy. Does not bruise/bleed easily.  Psychiatric/Behavioral: Positive for sleep disturbance (not sleeping well; waiting on CPAP). Negative for dysphoric mood. The patient is not nervous/anxious.     Vitals:   09/01/18 1143  BP: 132/78  Pulse: (!) 105  Resp: 18  SpO2: 94%  Weight: 240 lb 6 oz (109 kg)  Height: 5\' 3"  (1.6 m)   Wt Readings from Last 3 Encounters:  09/01/18 240 lb 6 oz (109 kg)  07/15/18 238 lb 11.2 oz (108.3 kg)  04/09/18 230 lb (104.3 kg)   Lab Results  Component Value Date   CREATININE 3.39 (H) 07/15/2018   CREATININE 3.27 (H) 07/14/2018   CREATININE 3.42 (H) 07/13/2018    Physical Exam Vitals signs and nursing note reviewed.  Constitutional:      Appearance: She is well-developed.  HENT:     Head: Normocephalic and  atraumatic.  Cardiovascular:     Rate and Rhythm: Tachycardia present. Rhythm irregular.  Pulmonary:     Effort: Pulmonary effort is normal. No respiratory distress.     Breath sounds: No wheezing or rales.  Abdominal:     Palpations: Abdomen is soft.     Tenderness: There is no abdominal tenderness.  Musculoskeletal:     Right lower leg: She exhibits no tenderness. No edema.     Left lower leg: She exhibits no tenderness. No edema.  Skin:    General: Skin is warm and dry.  Neurological:     General: No focal deficit present.     Mental Status: She is alert and oriented to person, place, and time.  Psychiatric:        Mood and Affect: Mood normal.        Behavior: Behavior normal.     Assessment & Plan:  1. Chronic heart failure with reduced ejection fraction- - NYHA class II - euvolemic today  - not weighing daily but does have scales; instructed to weigh daily and call for an overnight weight gain of >2 pounds or a weekly weight gain of >5 pounds - not adding salt to her food and has been reading food labels for sodium content. Reminded to keep daily sodium intake to 2000mg  sodium/ day - doubtful renal function can tolerate entresto - saw cardiology Nehemiah Massed) 08/13/2018 & returns later this week - BNP 07/13/2018 was 307.0  2: HTN- - BP looks good today - saw PCP Caryl Comes) 08/05/2018 - BMP from 08/13/2018 reviewed and showed sodium 139, potassium 4.9, creatinine 3.5 and GFR 16  3: DM- - A1c on 06/16/2018 was 8.6% - glucose at home was 117 - saw endocrinology ([facility]) 06/17/2018 - following with nephrology Holley Raring)   4: Atrial fibrillation- - taking eliquis - has been cardioverted in the past  - concern for sleep apnea contributing to her atrial fibrillation; home sleep study completed a few weeks ago; patient says that she's waiting now on CPAP equipment  Patient did not bring her medications nor a list. Each medication was verbally reviewed with the patient and she was  encouraged to bring the bottles to every visit to confirm accuracy of list.  Return in 1 month or sooner for any questions/problems before then.

## 2018-09-01 ENCOUNTER — Ambulatory Visit: Payer: Medicare Other | Attending: Family | Admitting: Family

## 2018-09-01 ENCOUNTER — Encounter: Payer: Self-pay | Admitting: Family

## 2018-09-01 ENCOUNTER — Other Ambulatory Visit: Payer: Self-pay

## 2018-09-01 VITALS — BP 132/78 | HR 105 | Resp 18 | Ht 63.0 in | Wt 240.4 lb

## 2018-09-01 DIAGNOSIS — F419 Anxiety disorder, unspecified: Secondary | ICD-10-CM | POA: Insufficient documentation

## 2018-09-01 DIAGNOSIS — Z79899 Other long term (current) drug therapy: Secondary | ICD-10-CM | POA: Diagnosis not present

## 2018-09-01 DIAGNOSIS — Z933 Colostomy status: Secondary | ICD-10-CM | POA: Insufficient documentation

## 2018-09-01 DIAGNOSIS — I130 Hypertensive heart and chronic kidney disease with heart failure and stage 1 through stage 4 chronic kidney disease, or unspecified chronic kidney disease: Secondary | ICD-10-CM | POA: Insufficient documentation

## 2018-09-01 DIAGNOSIS — Z888 Allergy status to other drugs, medicaments and biological substances status: Secondary | ICD-10-CM | POA: Insufficient documentation

## 2018-09-01 DIAGNOSIS — N189 Chronic kidney disease, unspecified: Secondary | ICD-10-CM | POA: Insufficient documentation

## 2018-09-01 DIAGNOSIS — D649 Anemia, unspecified: Secondary | ICD-10-CM | POA: Insufficient documentation

## 2018-09-01 DIAGNOSIS — E1122 Type 2 diabetes mellitus with diabetic chronic kidney disease: Secondary | ICD-10-CM | POA: Diagnosis not present

## 2018-09-01 DIAGNOSIS — E1140 Type 2 diabetes mellitus with diabetic neuropathy, unspecified: Secondary | ICD-10-CM | POA: Insufficient documentation

## 2018-09-01 DIAGNOSIS — Z794 Long term (current) use of insulin: Secondary | ICD-10-CM | POA: Diagnosis not present

## 2018-09-01 DIAGNOSIS — E039 Hypothyroidism, unspecified: Secondary | ICD-10-CM | POA: Insufficient documentation

## 2018-09-01 DIAGNOSIS — I5022 Chronic systolic (congestive) heart failure: Secondary | ICD-10-CM

## 2018-09-01 DIAGNOSIS — Z8249 Family history of ischemic heart disease and other diseases of the circulatory system: Secondary | ICD-10-CM | POA: Insufficient documentation

## 2018-09-01 DIAGNOSIS — Z952 Presence of prosthetic heart valve: Secondary | ICD-10-CM | POA: Diagnosis not present

## 2018-09-01 DIAGNOSIS — Z79890 Hormone replacement therapy (postmenopausal): Secondary | ICD-10-CM | POA: Diagnosis not present

## 2018-09-01 DIAGNOSIS — I4819 Other persistent atrial fibrillation: Secondary | ICD-10-CM

## 2018-09-01 DIAGNOSIS — Z7901 Long term (current) use of anticoagulants: Secondary | ICD-10-CM | POA: Insufficient documentation

## 2018-09-01 DIAGNOSIS — E785 Hyperlipidemia, unspecified: Secondary | ICD-10-CM | POA: Insufficient documentation

## 2018-09-01 DIAGNOSIS — I4891 Unspecified atrial fibrillation: Secondary | ICD-10-CM | POA: Insufficient documentation

## 2018-09-01 DIAGNOSIS — I350 Nonrheumatic aortic (valve) stenosis: Secondary | ICD-10-CM | POA: Insufficient documentation

## 2018-09-01 DIAGNOSIS — Z87891 Personal history of nicotine dependence: Secondary | ICD-10-CM | POA: Diagnosis not present

## 2018-09-01 DIAGNOSIS — I10 Essential (primary) hypertension: Secondary | ICD-10-CM

## 2018-09-01 NOTE — Patient Instructions (Addendum)
Begin weighing daily and call for an overnight weight gain of > 2 pounds or a weekly weight gain of >5 pounds. 

## 2018-09-03 DIAGNOSIS — K648 Other hemorrhoids: Secondary | ICD-10-CM | POA: Insufficient documentation

## 2018-09-03 DIAGNOSIS — K921 Melena: Secondary | ICD-10-CM | POA: Insufficient documentation

## 2018-09-03 DIAGNOSIS — K6289 Other specified diseases of anus and rectum: Secondary | ICD-10-CM | POA: Insufficient documentation

## 2018-10-01 ENCOUNTER — Encounter: Payer: Self-pay | Admitting: Pain Medicine

## 2018-10-04 NOTE — Progress Notes (Signed)
Pain Management Virtual Encounter Note - Virtual Visit via Telephone Telehealth (real-time audio visits between healthcare provider and patient).   Patient's Phone No. & Preferred Pharmacy:  [phone] (home); [phone] (mobile); (Preferred) [phone] No e-mail address on record  [id number] - [place], [place] [address] Phone: [phone] Fax: [phone]    Pre-screening note:  Our staff contacted [name] and offered her an "in person", "face-to-face" appointment versus a telephone encounter. She indicated preferring the telephone encounter, at this time.   Reason for Virtual Visit: COVID-19*  Social distancing based on CDC and AMA recommendations.   I contacted [name] on 10/05/2018 via telephone.      I clearly identified myself as Alicia Cola, MD. I verified that I was speaking with the correct person using two identifiers (Name: [name], and date of birth: 30-Jan-1951).  (The first attempt at calling the patient today had no response that mobile phone and then when I called her home she was that there, they indicated that she was seeing a kidney doctor.  Message left to give me a call.)  Advanced Informed Consent I sought verbal advanced consent from [name] for virtual visit interactions. I informed [name] of possible security and privacy concerns, risks, and limitations associated with providing "not-in-person" medical evaluation and management services. I also informed [name] of the availability of "in-person" appointments. Finally, I informed her that there would be a charge for the virtual visit and that she could be  personally, fully or partially, financially responsible for it. [name] expressed understanding and agreed to proceed.   Historic Elements   [name] is a 68 y.o. year old, female patient evaluated today after her last encounter by our practice on 07/07/2018. [name]   has a past medical history of Anemia, Anxiety, Aortic valve stenosis, Arrhythmia, Arthritis, B12 deficiency, Bowel obstruction (HCC), CHF (congestive heart failure) ([place]), CKD (chronic kidney disease), Colostomy in place [facility]), Diabetes mellitus without complication ([place]), Diverticulitis large intestine, Dysrhythmia, Ectopic atrial tachycardia ([place]), FSGS (focal segmental glomerulosclerosis), Gastritis, GI bleed, Heart murmur, Hyperlipidemia, Hypertension, Hypothyroidism, Hypothyroidism, MRSA (methicillin resistant Staphylococcus aureus), Neuropathy, Neuropathy in diabetes ([place]), Obesity, Pedal edema, Psoriasis, Shortness of breath dyspnea, and Vertigo. She also  has a past surgical history that includes Colectomy; laparotomy closure of cecal perforation (05/09/2013); Tonsillectomy; Back surgery; Cardiac catheterization; Abdominal hysterectomy; Debridement of abdominal wall abscess (N/A, 02/22/2015); Excision mass abdominal (N/A, 02/24/2015); Application if wound vac (N/A, 02/24/2015); Excision mass abdominal (N/A, 02/26/2015); Application if wound vac (N/A, 02/26/2015); Wound debridement (N/A, 03/03/2015); Wound debridement (N/A, 03/09/2015); Flexible sigmoidoscopy (06/19/2015); Colostomy reversal (N/A, 07/12/2015); laparotomy (07/12/2015); laparoscopy (07/12/2015); Lysis of adhesion (07/12/2015); laparotomy (N/A, 02/06/2015); Colonoscopy with propofol (N/A, 09/04/2016); Cardiac valve replacement (08/2017); and Cardioversion (N/A, 07/15/2018). [name] has a current medication list which includes the following prescription(s): amiodarone, amlodipine, apixaban, atorvastatin, calcitriol, clobetasol cream, duloxetine, dupixent, glucose blood, humalog kwikpen, hydrocortisone, lantus solostar, levothyroxine, metoprolol succinate, onetouch delica lancets 40H, torsemide, vitamin d (ergocalciferol), oxycodone, oxycodone, and oxycodone. She  reports that she quit smoking about 5 years ago. Her smoking use included cigarettes. She has never  used smokeless tobacco. She reports that she does not drink alcohol or use drugs. [name] is allergic to buspirone; citalopram; lisinopril; metformin; pravastatin; sitagliptin; tramadol; diltiazem hcl; gabapentin; hydralazine; and lovastatin.   HPI  Today, she is being contacted for medication management.  The patient indicates doing well  with the current medication regimen. No adverse reactions or side effects reported to the medications.   Pharmacotherapy Assessment  Analgesic: Oxycodone IR 5mg , 1 tab PO q 8hrs (15mg /day of oxycodone) MME/day:22.5mg /day.   Monitoring: Pharmacotherapy: No side-effects or adverse reactions reported. [place] PMP: PDMP reviewed during this encounter.       Compliance: No problems identified. Effectiveness: Clinically acceptable. Plan: Refer to "POC".  UDS:  Summary  Date Value Ref Range Status  04/09/2018 FINAL  Final    Comment:    ==================================================================== TOXASSURE SELECT 13 (MW) ==================================================================== Test                             Result       Flag       Units Drug Present not Declared for Prescription Verification   Oxycodone                      386          UNEXPECTED ng/mg creat   Oxymorphone                    614          UNEXPECTED ng/mg creat   Noroxycodone                   349          UNEXPECTED ng/mg creat   Noroxymorphone                 141          UNEXPECTED ng/mg creat    Sources of oxycodone are scheduled prescription medications.    Oxymorphone, noroxycodone, and noroxymorphone are expected    metabolites of oxycodone. Oxymorphone is also available as a    scheduled prescription medication. ==================================================================== Test                      Result    Flag   Units      Ref Range   Creatinine              76               mg/dL       >=20 ==================================================================== Declared Medications:  The flagging and interpretation on this report are based on the  following declared medications.  Unexpected results may arise from  inaccuracies in the declared medications.  **Note: The testing scope of this panel does not include following  reported medications:  Amlodipine Besylate  Apixaban  Atorvastatin  Clobetasol  Duloxetine  Insulin (Lantus)  Levothyroxine ==================================================================== For clinical consultation, please call [phone]. ====================================================================    Laboratory Chemistry Profile (12 mo)  Renal: 07/15/2018: BUN 55; Creatinine, Ser 3.39  Lab Results  Component Value Date   GFRAA 15 (L) 07/15/2018   GFRNONAA 13 (L) 07/15/2018   Hepatic: No results found for requested labs within last 8760 hours. Lab Results  Component Value Date   AST 19 07/24/2017   ALT 14 07/24/2017   Other: No results found for requested labs within last 8760 hours. Note: Above Lab results reviewed.  Imaging  Last 90 days:  Dg Chest 2 View  Result Date: 07/12/2018 CLINICAL DATA:  Shortness of breath EXAM: CHEST - 2 VIEW COMPARISON:  07/07/2015 FINDINGS: Cardiac shadow is mildly prominent. Aortic calcifications are seen. Postsurgical changes noted on the left in the subclavicular region.  Cervical spine surgical changes are noted as well. Increased vascular congestion is noted with mild interstitial edema consistent with CHF. 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 No bony abnormality is noted. IMPRESSION: Mild CHF. Electronically Signed   By: Inez Catalina M.D.   On: 07/12/2018 22:41   US Renal  Result Date: 07/13/2018 CLINICAL DATA:  Acute kidney injury. Chronic kidney disease stage 3/4. EXAM: RENAL / URINARY TRACT ULTRASOUND COMPLETE COMPARISON:  Renal ultrasound 07/16/2015. Abdominopelvic CT 02/21/2015.  FINDINGS: Right Kidney: Renal measurements: 9.1 x 3.6 x 5.1 cm = volume: 87.0 mL. There is cortical thinning with increased cortical echogenicity. No focal lesion or hydronephrosis. Left Kidney: Renal measurements: 9.5 x 3.9 x 3.5 cm = volume: 67.5 mL. There is cortical thinning with increased cortical echogenicity. No focal lesion or hydronephrosis. Bladder: Appears normal for degree of bladder distention. IMPRESSION: 1. Bilateral renal cortical thinning and increased echogenicity consistent with chronic medical renal disease. The right kidney measures slightly smaller than on the previous study. 2. No hydronephrosis. Electronically Signed   By: Richardean Sale M.D.   On: 07/13/2018 14:07   Assessment  The primary encounter diagnosis was Chronic pain syndrome. Diagnoses of Chronic abdominal pain (Primary Area of Pain) (Bilateral) (L>R), Chronic low back pain (Secondary area of Pain) (Bilateral) (L>R), Chronic lower extremity pain (Third area of Pain) (Bilateral) (R>L), and Lumbar facet syndrome (Bilateral) (L>R) were also pertinent to this visit.  Plan of Care  I have changed Neomia Glass. Overholser's oxyCODONE. I am also having her start on oxyCODONE and oxyCODONE. Additionally, I am having her maintain her amLODipine, OneTouch Delica Lancets 10X, glucose blood, atorvastatin, DULoxetine, levothyroxine, clobetasol cream, apixaban, calcitRIOL, metoprolol succinate, Vitamin D (Ergocalciferol), amiodarone, Dupixent, hydrocortisone, torsemide, HumaLOG KwikPen, and Lantus SoloStar.  Pharmacotherapy (Medications Ordered): Meds ordered this encounter  Medications  . oxyCODONE (OXY IR/ROXICODONE) 5 MG immediate release tablet    Sig: Take 1 tablet (5 mg total) by mouth every 8 (eight) hours as needed for severe pain. Must last 30 days    Dispense:  90 tablet    Refill:  0    Chronic Pain: STOP Act (Not applicable) Fill 1 day early if closed on refill date. Do not fill until: 10/06/2018. To last until: 11/05/2018.  Avoid benzodiazepines within 8 hours of opioids  . oxyCODONE (OXY IR/ROXICODONE) 5 MG immediate release tablet    Sig: Take 1 tablet (5 mg total) by mouth every 8 (eight) hours as needed for severe pain. Must last 30 days    Dispense:  90 tablet    Refill:  0    Chronic Pain: STOP Act (Not applicable) Fill 1 day early if closed on refill date. Do not fill until: 11/05/2018. To last until: 12/05/2018. Avoid benzodiazepines within 8 hours of opioids  . oxyCODONE (OXY IR/ROXICODONE) 5 MG immediate release tablet    Sig: Take 1 tablet (5 mg total) by mouth every 8 (eight) hours as needed for severe pain. Must last 30 days    Dispense:  90 tablet    Refill:  0    Chronic Pain: STOP Act (Not applicable) Fill 1 day early if closed on refill date. Do not fill until: 12/05/2018. To last until: 01/04/2019. Avoid benzodiazepines within 8 hours of opioids   Orders:  Orders Placed This Encounter  Procedures  . LUMBAR FACET(MEDIAL BRANCH NERVE BLOCK) MBNB    Standing Status:   Future    Standing Expiration Date:   11/05/2018    Scheduling Instructions:  Side: Bilateral     Level: L3-4, L4-5, & L5-S1 Facets (L2, L3, L4, L5, & S1 Medial Branch Nerves)     Sedation: Patient's choice.     Timeframe: ASAA    Order Specific Question:   Where will this procedure be performed?    Answer:   [facility]   Follow-up plan:   Return in about 13 weeks (around 01/04/2019) for (VV), E/M (MM), in addition, Procedure (w/ sedation): (B) L-FCT Blk #1, (ASAP).      Interventional therapies: Planned, scheduled, and/or pending:   None at this point, her stomach wound had not healed completely.   Considering:   Diagnostic bilateral lumbar facet block  Possible bilateral lumbar facet RFA.  Diagnostic bilateral celiac plexus block  Diagnostic bilateral IA hip injection  Diagnostic bilateral femoral nerve and obturator NB  Possible bilateral femoral nerve and obturator nerve RFA.  Diagnostic bilateral IA  knee joint injection  Possible series of 5 bilateral IA Hyalgan knee injections.  Diagnostic bilateral genicular NB  Possible bilateral genicular nerve RFA    Palliative PRN treatment(s):   Diagnostic bilateral lumbar facet block  Diagnostic bilateral celiac plexus block  Diagnostic bilateral IA hip injection  Diagnostic bilateral femoral nerve and obturator NB  Diagnostic bilateral IA knee joint injection Diagnostic bilateral genicular nerve block     Recent Visits No visits were found meeting these conditions.  Showing recent visits within past 90 days and meeting all other requirements   Today's Visits Date Type Provider Dept  10/05/18 Office Visit Milinda Pointer, MD [facility]  Showing today's visits and meeting all other requirements   Future Appointments No visits were found meeting these conditions.  Showing future appointments within next 90 days and meeting all other requirements   I discussed the assessment and treatment plan with the patient. The patient was provided an opportunity to ask questions and all were answered. The patient agreed with the plan and demonstrated an understanding of the instructions.  Patient advised to call back or seek an in-person evaluation if the symptoms or condition worsens.  Total duration of non-face-to-face encounter: 13 minutes.  Note by: Alicia Cola, MD Date: 10/05/2018; Time: 12:27 PM  Note: This dictation was prepared with Dragon dictation. Any transcriptional errors that may result from this process are unintentional.  Disclaimer:  * Given the special circumstances of the COVID-19 pandemic, the federal government has announced that the Office for Civil Rights (OCR) will exercise its enforcement discretion and will not impose penalties on physicians using telehealth in the event of noncompliance with regulatory requirements under the [place] (HIPAA) in connection  with the good faith provision of telehealth during the NLZJQ-73 national public health emergency. ([place])

## 2018-10-05 ENCOUNTER — Other Ambulatory Visit: Payer: Self-pay

## 2018-10-05 ENCOUNTER — Ambulatory Visit: Payer: Medicare Other | Admitting: Family

## 2018-10-05 ENCOUNTER — Ambulatory Visit: Payer: Medicare Other | Attending: Pain Medicine | Admitting: Pain Medicine

## 2018-10-05 DIAGNOSIS — M5441 Lumbago with sciatica, right side: Secondary | ICD-10-CM

## 2018-10-05 DIAGNOSIS — M79604 Pain in right leg: Secondary | ICD-10-CM | POA: Diagnosis not present

## 2018-10-05 DIAGNOSIS — R109 Unspecified abdominal pain: Secondary | ICD-10-CM | POA: Diagnosis not present

## 2018-10-05 DIAGNOSIS — G8929 Other chronic pain: Secondary | ICD-10-CM

## 2018-10-05 DIAGNOSIS — M47816 Spondylosis without myelopathy or radiculopathy, lumbar region: Secondary | ICD-10-CM

## 2018-10-05 DIAGNOSIS — M5442 Lumbago with sciatica, left side: Secondary | ICD-10-CM

## 2018-10-05 DIAGNOSIS — G894 Chronic pain syndrome: Secondary | ICD-10-CM

## 2018-10-05 DIAGNOSIS — M79605 Pain in left leg: Secondary | ICD-10-CM

## 2018-10-05 MED ORDER — OXYCODONE HCL 5 MG PO TABS: 5.0000 mg | ORAL_TABLET | Freq: Three times a day (TID) | ORAL | 0 refills | Status: DC | PRN

## 2018-10-05 NOTE — Patient Instructions (Signed)

## 2018-10-07 NOTE — Progress Notes (Signed)
Patient ID: [name], female    DOB: 12/18/1950, 68 y.o.   MRN: [id number]  HPI  [name] is a 68 y/o female with a history of DM, hyperlipidemia, HTN, CKD, thyroid disease, GIB, aortic valve stenosis, anxiety, anemia, atrial fibrillation, prior tobacco use and chronic heart failure.   Echo report from 06/24/2018 reviewed and showed an EF of 25% along with moderate MR/TR.   Admitted 07/12/2018 due to acute on chronic heart failure. Initially needed IV lasix and then transitioned to oral diuretics. Initially needed oxygen and then was able to be weaned off of it. Nephrology following due to CKD. Cardioverted while hospitalized. Discharged after 3 days.   She presents today for a follow-up visit with a chief complaint of moderate shortness of breath upon minimal exertion. She has associated fatigue, numbness in both hands, weakness and difficulty sleeping along with this. She denies dizziness, abdominal distention, palpitations, pedal edema, chest pain or weight gain. She has only been taking her metoprolol daily instead of BID. She says that she will be starting dialysis in the future.  Past Medical History:  Diagnosis Date  . Anemia   . Anxiety   . Aortic valve stenosis   . Arrhythmia    atrial fibrillation  . Arthritis    feet, legs  . B12 deficiency   . Bowel obstruction ([place])   . CHF (congestive heart failure) ([place])   . CKD (chronic kidney disease)    protein in urine  . Colostomy in place [facility]   . Diabetes mellitus without complication ([place])   . Diverticulitis large intestine   . Dysrhythmia   . Ectopic atrial tachycardia ([place])   . FSGS (focal segmental glomerulosclerosis)   . Gastritis   . GI bleed   . Heart murmur   . Hyperlipidemia   . Hypertension   . Hypothyroidism   . Hypothyroidism    unspecified  . MRSA (methicillin resistant Staphylococcus aureus)    at abdominal wound.  Jan 2017.  Treated.   . Neuropathy   . Neuropathy in diabetes ([place])   . Obesity   .  Pedal edema   . Psoriasis   . Shortness of breath dyspnea   . Vertigo    Past Surgical History:  Procedure Laterality Date  . ABDOMINAL HYSTERECTOMY    . APPLICATION OF WOUND VAC N/A 02/24/2015   Procedure: APPLICATION OF WOUND VAC;  Surgeon: Clayburn Pert, MD;  Location: [facility];  Service: General;  Laterality: N/A;  . APPLICATION OF WOUND VAC N/A 02/26/2015   Procedure: APPLICATION OF WOUND VAC;  Surgeon: Clayburn Pert, MD;  Location: [facility];  Service: General;  Laterality: N/A;  . BACK SURGERY     spur frmoved from lower back  . CARDIAC CATHETERIZATION    . CARDIAC VALVE REPLACEMENT  08/2017   [facility]  . CARDIOVERSION N/A 07/15/2018   Procedure: CARDIOVERSION;  Surgeon: Corey Skains, MD;  Location: [facility];  Service: Cardiovascular;  Laterality: N/A;  . COLECTOMY    . COLONOSCOPY WITH PROPOFOL N/A 09/04/2016   Procedure: COLONOSCOPY WITH PROPOFOL;  Surgeon: Manya Silvas, MD;  [facility] END[facility]COPY;  Service: Endoscopy;  Laterality: N/A;  . COLOSTOMY REVERSAL N/A 07/12/2015   Procedure: COLOSTOMY REVERSAL;  Surgeon: Clayburn Pert, MD;  Location: [facility];  Service: General;  Laterality: N/A;  . DEBRIDEMENT OF ABDOMINAL WALL ABSCESS N/A 02/22/2015   Procedure: DEBRIDEMENT OF ABDOMINAL WALL ABSCESS;  Surgeon: Clayburn Pert, MD;  Location: [facility];  Service: General;  Laterality: N/A;  . EXCISION MASS ABDOMINAL N/A 02/24/2015   Procedure: EXCISION MASS ABDOMINAL  / [place];  Surgeon: Clayburn Pert, MD;  Location: [facility];  Service: General;  Laterality: N/A;  . EXCISION MASS ABDOMINAL N/A 02/26/2015   Procedure: EXCISION MASS ABDOMINAL/wash out;  Surgeon: Clayburn Pert, MD;  Location: [facility];  Service: General;  Laterality: N/A;  . FLEXIBLE SIGMOIDOSCOPY  06/19/2015   Procedure: FLEXIBLE SIGMOIDOSCOPY;  Surgeon: Lucilla Lame, MD;  Location: [place];  Service: Endoscopy;;  UNABLE TO ACCESS OSTOMY SITE FOR ACCESS INTO COLON  . LAPAROSCOPY  07/12/2015    Procedure: LAPAROSCOPY DIAGNOSTIC;  Surgeon: Clayburn Pert, MD;  Location: [facility];  Service: General;;  . LAPAROTOMY  07/12/2015   Procedure: EXPLORATORY LAPAROTOMY;  Surgeon: Clayburn Pert, MD;  Location: [facility];  Service: General;;  . LAPAROTOMY N/A 02/06/2015   Procedure: Laparotomy, reduction of incarcerated parastomal hernia, repair of parastomal hernia with mesh;  Surgeon: Sherri Rad, MD;  Location: [facility];  Service: General;  Laterality: N/A;  . laparotomy closure of cecal perforation  05/09/2013   Dr. Marina Gravel  . LYSIS OF ADHESION  07/12/2015   Procedure: LYSIS OF ADHESION;  Surgeon: Clayburn Pert, MD;  Location: [facility];  Service: General;;  . TONSILLECTOMY    . WOUND DEBRIDEMENT N/A 03/03/2015   Procedure: DEBRIDEMENT ABDOMINAL WOUND;  Surgeon: Florene Glen, MD;  Location: [facility];  Service: General;  Laterality: N/A;  . WOUND DEBRIDEMENT N/A 03/09/2015   Procedure: DEBRIDEMENT ABDOMINAL WOUND;  Surgeon: Florene Glen, MD;  Location: [facility];  Service: General;  Laterality: N/A;   Family History  Problem Relation Age of Onset  . Diabetes Mother   . Hypertension Father   . CAD Father   . Heart attack Father   . Colon cancer Brother    Social History   Tobacco Use  . Smoking status: Former Smoker    Types: Cigarettes    Quit date: 04/18/2013    Years since quitting: 5.4  . Smokeless tobacco: Never Used  Substance Use Topics  . Alcohol use: No    Alcohol/week: 0.0 standard drinks   Allergies  Allergen Reactions  . Buspirone Other (See Comments)    Weakness  . Citalopram Other (See Comments)  . Lisinopril Cough  . Metformin Diarrhea    At 1000 mg dose  . Pravastatin Other (See Comments)    insomnia  . Sitagliptin Other (See Comments)    constipation  . Tramadol Itching  . Diltiazem Hcl Palpitations  . Gabapentin Palpitations  . Hydralazine Rash  . Lovastatin Palpitations   Prior to Admission medications   Medication Sig Start Date End Date  Taking? Authorizing Provider  amiodarone (PACERONE) 200 MG tablet Take 200 mg by mouth daily.  06/29/18  Yes [provider]  amLODipine (NORVASC) 5 MG tablet Take 1 tablet (5 mg total) by mouth daily. 07/22/15  Yes Florene Glen, MD  apixaban (ELIQUIS) 5 MG TABS tablet Take 5 mg by mouth 2 (two) times daily.   Yes [provider]  atorvastatin (LIPITOR) 40 MG tablet Take 40 mg by mouth daily.  04/19/15  Yes [provider]  calcitRIOL (ROCALTROL) 0.25 MCG capsule Take 0.25 mcg by mouth daily.   Yes [provider]  clobetasol cream (TEMOVATE) 2.99 % Apply 1 application topically every other day.  12/29/15  Yes [provider]  DULoxetine (CYMBALTA) 30 MG capsule Take 30 mg by mouth daily.  01/18/15  Yes [provider]  DUPIXENT 300 MG/2ML SOSY Inject 300 mg into the skin every 14 (fourteen) days. 05/28/18  Yes [provider]  glucose blood (ONE TOUCH ULTRA TEST) test strip TEST TWICE DAILY 09/06/14  Yes [provider]  HUMALOG KWIKPEN 100 UNIT/ML KwikPen 6 units subcutaneous injection prior to meals 07/15/18  Yes Wieting, Richard, MD  hydrocortisone (ANUSOL-HC) 25 MG suppository Place 25 mg rectally 2 (two) times daily as needed for hemorrhoids. 06/19/18  Yes [provider]  LANTUS SOLOSTAR 100 UNIT/ML Solostar Pen Inject 25 Units into the skin at bedtime. 07/15/18  Yes Wieting, Richard, MD  levothyroxine (SYNTHROID, LEVOTHROID) 88 MCG tablet Take 88 mcg by mouth daily before breakfast.  09/11/15  Yes [provider]  metoprolol succinate (TOPROL-XL) 50 MG 24 hr tablet Take 25 mg by mouth 2 (two) times daily.  06/24/18  Yes [provider]  Jonetta Speak LANCETS 97L [place] TEST TWICE DAILY 09/06/14  Yes [provider]  oxyCODONE (OXY IR/ROXICODONE) 5 MG immediate release tablet Take 1 tablet (5 mg total) by mouth every 8 (eight) hours as needed for severe pain. Must last 30 days 10/06/18 11/05/18 Yes  Milinda Pointer, MD  oxyCODONE (OXY IR/ROXICODONE) 5 MG immediate release tablet Take 1 tablet (5 mg total) by mouth every 8 (eight) hours as needed for severe pain. Must last 30 days 11/05/18 12/05/18 Yes Milinda Pointer, MD  oxyCODONE (OXY IR/ROXICODONE) 5 MG immediate release tablet Take 1 tablet (5 mg total) by mouth every 8 (eight) hours as needed for severe pain. Must last 30 days 12/05/18 01/04/19 Yes Milinda Pointer, MD  torsemide (DEMADEX) 20 MG tablet Take one tab po daily (can take another pill if short of breath or gains 3 lbs in one day or 5lbs in one week) 07/15/18  Yes Wieting, Richard, MD  Vitamin D, Ergocalciferol, (DRISDOL) 1.25 MG (50000 UT) CAPS capsule Take 50,000 Units by mouth every Tuesday.   Yes [provider]     Review of Systems  Constitutional: Positive for fatigue (easily). Negative for activity change.  HENT: Negative for congestion, rhinorrhea and sore throat.   Eyes: Negative.   Respiratory: Positive for shortness of breath (with minimal exertion). Negative for cough.   Cardiovascular: Negative for chest pain, palpitations and leg swelling.  Gastrointestinal: Negative for abdominal distention and abdominal pain.  Endocrine: Negative.   Genitourinary: Negative.   Musculoskeletal: Positive for back pain (at times). Negative for neck pain.  Skin: Negative.   Allergic/Immunologic: Negative.   Neurological: Positive for weakness and numbness (in both hands). Negative for dizziness and light-headedness.  Hematological: Negative for adenopathy. Does not bruise/bleed easily.  Psychiatric/Behavioral: Positive for sleep disturbance (not sleeping well; waiting on CPAP). Negative for dysphoric mood. The patient is not nervous/anxious.    Vitals:   10/08/18 1219  BP: 102/77  Pulse: (!) 121  Resp: 18  SpO2: 94%  Weight: 238 lb 2 oz (108 kg)  Height: 5\' 3"  (1.6 m)   Wt Readings from Last 3 Encounters:  10/08/18 238 lb 2 oz (108 kg)  09/01/18 240  lb 6 oz (109 kg)  07/15/18 238 lb 11.2 oz (108.3 kg)   Lab Results  Component Value Date   CREATININE 3.39 (H) 07/15/2018   CREATININE 3.27 (H) 07/14/2018   CREATININE 3.42 (H) 07/13/2018    Physical Exam Vitals signs and nursing note reviewed.  Constitutional:      Appearance: She is well-developed.  HENT:     Head: Normocephalic and atraumatic.  Cardiovascular:     Rate and Rhythm: Regular rhythm. Tachycardia present.  Pulmonary:     Effort: Pulmonary effort is normal. No respiratory distress.     Breath sounds: No wheezing or rales.  Abdominal:     Palpations: Abdomen is soft.     Tenderness: There is no abdominal tenderness.  Musculoskeletal:     Right lower leg: She exhibits no tenderness. No edema.     Left lower leg: She exhibits no tenderness. No edema.  Skin:    General: Skin is warm and dry.  Neurological:     General: No focal deficit present.     Mental Status: She is alert and oriented to person, place, and time.  Psychiatric:        Mood and Affect: Mood normal.        Behavior: Behavior normal.     Assessment & Plan:  1. Chronic heart failure with reduced ejection fraction- - NYHA class II - euvolemic today  - weighing daily; reminded to call for an overnight weight gain of >2 pounds or a weekly weight gain of >5 pounds - weight down 2 pounds from last visit 1 month ago - not adding salt to her food and has been reading food labels for sodium content. Reminded to keep daily sodium intake to 2000mg  sodium/ day - doubtful renal function can tolerate entresto - GFR too low for farxiga - saw cardiology Nehemiah Massed) 09/10/2018; note said for her to take her metoprolol BID and she's been taking it once daily - BNP 07/13/2018 was 307.0  2: HTN- - BP on the low side - saw PCP Caryl Comes) 08/05/2018 - BMP from 09/18/2018 reviewed and showed sodium 140, potassium 5.7, creatinine 3.8 and GFR 14  3: DM- - A1c on 09/18/2018 was 8.3% - glucose at home was 61 - saw  endocrinology ([facility]) 09/18/2018 - following with nephrology Holley Raring) & saw him a couple of days ago; dialysis discussion was held  4: Atrial fibrillation- - taking eliquis - was cardioverted 07/12/2018 - advised patient to take her metoprolol 25mg  twice daily (she's been taking it daily) especially since her HR is elevated today - concern for sleep apnea contributing to her atrial fibrillation; sleep study completed 08/17/2018 - patient has CPAP equipment but says that it's leaking water so she has to return it  Patient did not bring her medications nor a list. Each medication was verbally reviewed with the patient and she was encouraged to bring the bottles to every visit to confirm accuracy of list.  Patient opts to not make a return appointment at this time.

## 2018-10-08 ENCOUNTER — Encounter: Payer: Self-pay | Admitting: Family

## 2018-10-08 ENCOUNTER — Other Ambulatory Visit: Payer: Self-pay

## 2018-10-08 ENCOUNTER — Ambulatory Visit: Payer: Medicare Other | Attending: Family | Admitting: Family

## 2018-10-08 VITALS — BP 102/77 | HR 121 | Resp 18 | Ht 63.0 in | Wt 238.1 lb

## 2018-10-08 DIAGNOSIS — R200 Anesthesia of skin: Secondary | ICD-10-CM | POA: Diagnosis not present

## 2018-10-08 DIAGNOSIS — E1122 Type 2 diabetes mellitus with diabetic chronic kidney disease: Secondary | ICD-10-CM | POA: Insufficient documentation

## 2018-10-08 DIAGNOSIS — F419 Anxiety disorder, unspecified: Secondary | ICD-10-CM | POA: Diagnosis not present

## 2018-10-08 DIAGNOSIS — R0602 Shortness of breath: Secondary | ICD-10-CM | POA: Diagnosis present

## 2018-10-08 DIAGNOSIS — D649 Anemia, unspecified: Secondary | ICD-10-CM | POA: Insufficient documentation

## 2018-10-08 DIAGNOSIS — Z8249 Family history of ischemic heart disease and other diseases of the circulatory system: Secondary | ICD-10-CM | POA: Insufficient documentation

## 2018-10-08 DIAGNOSIS — E039 Hypothyroidism, unspecified: Secondary | ICD-10-CM | POA: Insufficient documentation

## 2018-10-08 DIAGNOSIS — Z79899 Other long term (current) drug therapy: Secondary | ICD-10-CM | POA: Insufficient documentation

## 2018-10-08 DIAGNOSIS — G629 Polyneuropathy, unspecified: Secondary | ICD-10-CM | POA: Insufficient documentation

## 2018-10-08 DIAGNOSIS — Z87891 Personal history of nicotine dependence: Secondary | ICD-10-CM | POA: Insufficient documentation

## 2018-10-08 DIAGNOSIS — I5022 Chronic systolic (congestive) heart failure: Secondary | ICD-10-CM | POA: Diagnosis not present

## 2018-10-08 DIAGNOSIS — Z7901 Long term (current) use of anticoagulants: Secondary | ICD-10-CM | POA: Diagnosis not present

## 2018-10-08 DIAGNOSIS — M1990 Unspecified osteoarthritis, unspecified site: Secondary | ICD-10-CM | POA: Diagnosis not present

## 2018-10-08 DIAGNOSIS — I480 Paroxysmal atrial fibrillation: Secondary | ICD-10-CM

## 2018-10-08 DIAGNOSIS — I4891 Unspecified atrial fibrillation: Secondary | ICD-10-CM | POA: Diagnosis not present

## 2018-10-08 DIAGNOSIS — Z952 Presence of prosthetic heart valve: Secondary | ICD-10-CM | POA: Diagnosis not present

## 2018-10-08 DIAGNOSIS — E1140 Type 2 diabetes mellitus with diabetic neuropathy, unspecified: Secondary | ICD-10-CM | POA: Diagnosis not present

## 2018-10-08 DIAGNOSIS — E785 Hyperlipidemia, unspecified: Secondary | ICD-10-CM | POA: Insufficient documentation

## 2018-10-08 DIAGNOSIS — R531 Weakness: Secondary | ICD-10-CM | POA: Insufficient documentation

## 2018-10-08 DIAGNOSIS — Z794 Long term (current) use of insulin: Secondary | ICD-10-CM | POA: Insufficient documentation

## 2018-10-08 DIAGNOSIS — N189 Chronic kidney disease, unspecified: Secondary | ICD-10-CM | POA: Diagnosis not present

## 2018-10-08 DIAGNOSIS — R5383 Other fatigue: Secondary | ICD-10-CM | POA: Insufficient documentation

## 2018-10-08 DIAGNOSIS — I350 Nonrheumatic aortic (valve) stenosis: Secondary | ICD-10-CM | POA: Diagnosis not present

## 2018-10-08 DIAGNOSIS — R011 Cardiac murmur, unspecified: Secondary | ICD-10-CM | POA: Diagnosis not present

## 2018-10-08 DIAGNOSIS — I130 Hypertensive heart and chronic kidney disease with heart failure and stage 1 through stage 4 chronic kidney disease, or unspecified chronic kidney disease: Secondary | ICD-10-CM | POA: Insufficient documentation

## 2018-10-08 DIAGNOSIS — G479 Sleep disorder, unspecified: Secondary | ICD-10-CM | POA: Diagnosis not present

## 2018-10-08 DIAGNOSIS — I10 Essential (primary) hypertension: Secondary | ICD-10-CM

## 2018-10-08 NOTE — Patient Instructions (Addendum)
Continue weighing daily and call for an overnight weight gain of > 2 pounds or a weekly weight gain of >5 pounds.   Take your metoprolol as 1/2 tablet twice daily due to fast heart rate

## 2018-10-15 ENCOUNTER — Telehealth (INDEPENDENT_AMBULATORY_CARE_PROVIDER_SITE_OTHER): Payer: Self-pay | Admitting: Vascular Surgery

## 2018-10-15 NOTE — Telephone Encounter (Signed)
Patient has been scheduled for [facility] Cath placement on 10/20/18 at 12pm. Patient is aware to arrive at [facility] at 11 am and to be NPO after midnight. Patient is aware she can take medicine with small sips of water but to stop Eliquis on Monday and not to resume with Eliquis until day after procedure. Also aware to take half of insulin dose day of the procedure.  Patient is aware that COVID testing is required to be done on 10/16/18 between 12:30-2:30 at the [facility] and that without COVID testing the procedure will be canceled.  Sending a letter with all instructions to patient via mail. AS, CMA

## 2018-10-16 ENCOUNTER — Other Ambulatory Visit
Admission: RE | Admit: 2018-10-16 | Discharge: 2018-10-16 | Disposition: A | Discharge status: Home / Self Care | Payer: Medicare Other | Source: Ambulatory Visit | Attending: Vascular Surgery | Admitting: Vascular Surgery

## 2018-10-16 ENCOUNTER — Other Ambulatory Visit: Payer: Self-pay

## 2018-10-16 DIAGNOSIS — Z01812 Encounter for preprocedural laboratory examination: Secondary | ICD-10-CM | POA: Diagnosis present

## 2018-10-16 DIAGNOSIS — Z20828 Contact with and (suspected) exposure to other viral communicable diseases: Secondary | ICD-10-CM | POA: Insufficient documentation

## 2018-10-17 LAB — SARS CORONAVIRUS 2 (TAT 6-24 HRS): SARS Coronavirus 2: NEGATIVE

## 2018-10-20 ENCOUNTER — Other Ambulatory Visit (INDEPENDENT_AMBULATORY_CARE_PROVIDER_SITE_OTHER): Payer: Self-pay | Admitting: Nurse Practitioner

## 2018-10-20 ENCOUNTER — Encounter
Admission: RE | Disposition: A | Discharge status: Home / Self Care | Payer: Self-pay | Source: Home / Self Care | Attending: Vascular Surgery

## 2018-10-20 ENCOUNTER — Ambulatory Visit
Admission: RE | Admit: 2018-10-20 | Discharge: 2018-10-20 | Disposition: A | Discharge status: Home / Self Care | Payer: Medicare Other | Attending: Vascular Surgery | Admitting: Vascular Surgery

## 2018-10-20 ENCOUNTER — Other Ambulatory Visit: Payer: Self-pay

## 2018-10-20 DIAGNOSIS — Y841 Kidney dialysis as the cause of abnormal reaction of the patient, or of later complication, without mention of misadventure at the time of the procedure: Secondary | ICD-10-CM | POA: Diagnosis not present

## 2018-10-20 DIAGNOSIS — I2510 Atherosclerotic heart disease of native coronary artery without angina pectoris: Secondary | ICD-10-CM | POA: Insufficient documentation

## 2018-10-20 DIAGNOSIS — Z8249 Family history of ischemic heart disease and other diseases of the circulatory system: Secondary | ICD-10-CM | POA: Insufficient documentation

## 2018-10-20 DIAGNOSIS — T82868A Thrombosis of vascular prosthetic devices, implants and grafts, initial encounter: Secondary | ICD-10-CM | POA: Insufficient documentation

## 2018-10-20 DIAGNOSIS — I120 Hypertensive chronic kidney disease with stage 5 chronic kidney disease or end stage renal disease: Secondary | ICD-10-CM | POA: Insufficient documentation

## 2018-10-20 DIAGNOSIS — Z87891 Personal history of nicotine dependence: Secondary | ICD-10-CM | POA: Insufficient documentation

## 2018-10-20 DIAGNOSIS — N186 End stage renal disease: Secondary | ICD-10-CM

## 2018-10-20 DIAGNOSIS — Z952 Presence of prosthetic heart valve: Secondary | ICD-10-CM | POA: Diagnosis not present

## 2018-10-20 DIAGNOSIS — Z888 Allergy status to other drugs, medicaments and biological substances status: Secondary | ICD-10-CM | POA: Insufficient documentation

## 2018-10-20 DIAGNOSIS — Z992 Dependence on renal dialysis: Secondary | ICD-10-CM | POA: Insufficient documentation

## 2018-10-20 DIAGNOSIS — Z885 Allergy status to narcotic agent status: Secondary | ICD-10-CM | POA: Insufficient documentation

## 2018-10-20 DIAGNOSIS — E1122 Type 2 diabetes mellitus with diabetic chronic kidney disease: Secondary | ICD-10-CM | POA: Diagnosis not present

## 2018-10-20 HISTORY — PX: DIALYSIS/PERMA CATHETER INSERTION: CATH118288

## 2018-10-20 LAB — POTASSIUM (ARMC VASCULAR LAB ONLY): Potassium (ARMC vascular lab): 4.7 (ref 3.5–5.1)

## 2018-10-20 LAB — GLUCOSE, CAPILLARY
Glucose-Capillary: 111 mg/dL — ABNORMAL HIGH (ref 70–99)
Glucose-Capillary: 101 mg/dL — ABNORMAL HIGH (ref 70–99)

## 2018-10-20 SURGERY — DIALYSIS/PERMA CATHETER INSERTION
Anesthesia: Moderate Sedation

## 2018-10-20 MED ORDER — METHYLPREDNISOLONE SODIUM SUCC 125 MG IJ SOLR: 125.0000 mg | Freq: Once | INTRAMUSCULAR | Status: DC | PRN

## 2018-10-20 MED ORDER — FENTANYL CITRATE (PF) 100 MCG/2ML IJ SOLN
INTRAMUSCULAR | Status: AC
  Filled 2018-10-20: qty 2

## 2018-10-20 MED ORDER — HYDROMORPHONE HCL 1 MG/ML IJ SOLN: 1.0000 mg | Freq: Once | INTRAMUSCULAR | Status: DC | PRN

## 2018-10-20 MED ORDER — CEFAZOLIN SODIUM-DEXTROSE 1-4 GM/50ML-% IV SOLN
INTRAVENOUS | Status: AC
  Filled 2018-10-20: qty 50

## 2018-10-20 MED ORDER — ONDANSETRON HCL 4 MG/2ML IJ SOLN: 4.0000 mg | Freq: Four times a day (QID) | INTRAMUSCULAR | Status: DC | PRN

## 2018-10-20 MED ORDER — FENTANYL CITRATE (PF) 100 MCG/2ML IJ SOLN
INTRAMUSCULAR | Status: DC | PRN
  Administered 2018-10-20 (×2): 50 ug via INTRAVENOUS

## 2018-10-20 MED ORDER — HEPARIN SODIUM (PORCINE) 1000 UNIT/ML IJ SOLN
INTRAMUSCULAR | Status: AC
  Filled 2018-10-20: qty 1

## 2018-10-20 MED ORDER — MIDAZOLAM HCL 2 MG/2ML IJ SOLN
INTRAMUSCULAR | Status: DC | PRN
  Administered 2018-10-20: 2 mg via INTRAVENOUS
  Administered 2018-10-20: 1 mg via INTRAVENOUS

## 2018-10-20 MED ORDER — FAMOTIDINE 20 MG PO TABS: 40.0000 mg | ORAL_TABLET | Freq: Once | ORAL | Status: DC | PRN

## 2018-10-20 MED ORDER — CEFAZOLIN SODIUM-DEXTROSE 2-4 GM/100ML-% IV SOLN
2.0000 g | Freq: Once | INTRAVENOUS | Status: AC
  Administered 2018-10-20: 14:00:00 1 g via INTRAVENOUS

## 2018-10-20 MED ORDER — SODIUM CHLORIDE 0.9 % IV SOLN
INTRAVENOUS | Status: DC
  Administered 2018-10-20: 12:00:00 via INTRAVENOUS

## 2018-10-20 MED ORDER — MIDAZOLAM HCL 5 MG/5ML IJ SOLN
INTRAMUSCULAR | Status: AC
  Filled 2018-10-20: qty 5

## 2018-10-20 MED ORDER — DIPHENHYDRAMINE HCL 50 MG/ML IJ SOLN: 50.0000 mg | Freq: Once | INTRAMUSCULAR | Status: DC | PRN

## 2018-10-20 MED ORDER — HEPARIN SODIUM (PORCINE) 5000 UNIT/ML IJ SOLN
INTRAMUSCULAR | Status: AC
  Filled 2018-10-20: qty 1

## 2018-10-20 MED ORDER — MIDAZOLAM HCL 2 MG/ML PO SYRP: 8.0000 mg | ORAL_SOLUTION | Freq: Once | ORAL | Status: DC | PRN

## 2018-10-20 SURGICAL SUPPLY — 10 items
CATH CANNON HEMO 15FR 19 (HEMODIALYSIS SUPPLIES) ×3 IMPLANT
COVER PROBE U/S 5X48 (MISCELLANEOUS) ×3 IMPLANT
DERMABOND ADVANCED (GAUZE/BANDAGES/DRESSINGS) ×2
DERMABOND ADVANCED .7 DNX12 (GAUZE/BANDAGES/DRESSINGS) ×1 IMPLANT
DRAPE INCISE IOBAN 66X45 STRL (DRAPES) ×3 IMPLANT
NEEDLE ENTRY 21GA 7CM ECHOTIP (NEEDLE) ×3 IMPLANT
PACK ANGIOGRAPHY (CUSTOM PROCEDURE TRAY) ×3 IMPLANT
SET INTRO CAPELLA COAXIAL (SET/KITS/TRAYS/PACK) ×6 IMPLANT
SUT MNCRL AB 4-0 PS2 18 (SUTURE) ×3 IMPLANT
SUT SILK 0 FSL (SUTURE) ×3 IMPLANT

## 2018-10-20 NOTE — H&P (Signed)
[facility] [place] Admission History & Physical  MRN : [id number]  [name] is a 68 y.o. (1950/08/18) female who presents with chief complaint of No chief complaint on file. Marland Kitchen  History of Present Illness:  I am asked to evaluate the patient by her nephrologist. The patient was sent here because they now need to initiate dialysis and are requesting a permacatheter.  This problem has been getting worse for about 1 week. The patient is unaware of any other change.   Patient denies fevers or shaking chills while on dialysis.    There have not been any past interventions.  The patient is not chronically hypotensive.   Current Facility-Administered Medications  Medication Dose Route Frequency Provider Last Rate Last Dose  . ceFAZolin (ANCEF) 1-4 GM/50ML-% IVPB           . fentaNYL (SUBLIMAZE) 100 MCG/2ML injection           . heparin 1000 UNIT/ML injection           . heparin 5000 UNIT/ML injection           . midazolam (VERSED) 5 MG/5ML injection           . 0.9 %  sodium chloride infusion   Intravenous Continuous Kris Hartmann, NP 10 mL/hr at 10/20/18 1202    . ceFAZolin (ANCEF) IVPB 2g/100 mL premix  2 g Intravenous Once Kris Hartmann, NP      . diphenhydrAMINE (BENADRYL) injection 50 mg  50 mg Intravenous Once PRN Kris Hartmann, NP      . famotidine (PEPCID) tablet 40 mg  40 mg Oral Once PRN Kris Hartmann, NP      . HYDROmorphone (DILAUDID) injection 1 mg  1 mg Intravenous Once PRN Eulogio Ditch E, NP      . methylPREDNISolone sodium succinate (SOLU-MEDROL) 125 mg/2 mL injection 125 mg  125 mg Intravenous Once PRN Eulogio Ditch E, NP      . midazolam (VERSED) 2 MG/ML syrup 8 mg  8 mg Oral Once PRN Kris Hartmann, NP      . ondansetron (ZOFRAN) injection 4 mg  4 mg Intravenous Q6H PRN Kris Hartmann, NP         Past Surgical History:  Procedure Laterality Date  . ABDOMINAL HYSTERECTOMY    . APPLICATION OF WOUND VAC N/A 02/24/2015   Procedure:  APPLICATION OF WOUND VAC;  Surgeon: Clayburn Pert, MD;  Location: [facility];  Service: General;  Laterality: N/A;  . APPLICATION OF WOUND VAC N/A 02/26/2015   Procedure: APPLICATION OF WOUND VAC;  Surgeon: Clayburn Pert, MD;  Location: [facility];  Service: General;  Laterality: N/A;  . BACK SURGERY     spur frmoved from lower back  . CARDIAC CATHETERIZATION    . CARDIAC VALVE REPLACEMENT  08/2017   [facility]  . CARDIOVERSION N/A 07/15/2018   Procedure: CARDIOVERSION;  Surgeon: Corey Skains, MD;  Location: [facility];  Service: Cardiovascular;  Laterality: N/A;  . COLECTOMY    . COLONOSCOPY WITH PROPOFOL N/A 09/04/2016   Procedure: COLONOSCOPY WITH PROPOFOL;  Surgeon: Manya Silvas, MD;  Location: [facility] ENDOSCOPY;  Service: Endoscopy;  Laterality: N/A;  . COLOSTOMY REVERSAL N/A 07/12/2015   Procedure: COLOSTOMY REVERSAL;  Surgeon: Clayburn Pert, MD;  Location: [facility];  Service: General;  Laterality: N/A;  . DEBRIDEMENT OF ABDOMINAL WALL ABSCESS N/A 02/22/2015   Procedure: DEBRIDEMENT OF ABDOMINAL WALL ABSCESS;  Surgeon: Clayburn Pert, MD;  Location: [facility];  Service: General;  Laterality: N/A;  . EXCISION MASS ABDOMINAL N/A 02/24/2015   Procedure: EXCISION MASS ABDOMINAL  / [place];  Surgeon: Clayburn Pert, MD;  Location: [facility];  Service: General;  Laterality: N/A;  . EXCISION MASS ABDOMINAL N/A 02/26/2015   Procedure: EXCISION MASS ABDOMINAL/wash out;  Surgeon: Clayburn Pert, MD;  Location: [facility];  Service: General;  Laterality: N/A;  . FLEXIBLE SIGMOIDOSCOPY  06/19/2015   Procedure: FLEXIBLE SIGMOIDOSCOPY;  Surgeon: Lucilla Lame, MD;  Location: [place];  Service: Endoscopy;;  UNABLE TO ACCESS OSTOMY SITE FOR ACCESS INTO COLON  . LAPAROSCOPY  07/12/2015   Procedure: LAPAROSCOPY DIAGNOSTIC;  Surgeon: Clayburn Pert, MD;  Location: [facility];  Service: General;;  . LAPAROTOMY  07/12/2015   Procedure: EXPLORATORY LAPAROTOMY;  Surgeon: Clayburn Pert, MD;  Location: [facility];   Service: General;;  . LAPAROTOMY N/A 02/06/2015   Procedure: Laparotomy, reduction of incarcerated parastomal hernia, repair of parastomal hernia with mesh;  Surgeon: Sherri Rad, MD;  Location: [facility];  Service: General;  Laterality: N/A;  . laparotomy closure of cecal perforation  05/09/2013   Dr. Marina Gravel  . LYSIS OF ADHESION  07/12/2015   Procedure: LYSIS OF ADHESION;  Surgeon: Clayburn Pert, MD;  Location: [facility];  Service: General;;  . TONSILLECTOMY    . WOUND DEBRIDEMENT N/A 03/03/2015   Procedure: DEBRIDEMENT ABDOMINAL WOUND;  Surgeon: Florene Glen, MD;  Location: [facility];  Service: General;  Laterality: N/A;  . WOUND DEBRIDEMENT N/A 03/09/2015   Procedure: DEBRIDEMENT ABDOMINAL WOUND;  Surgeon: Florene Glen, MD;  Location: [facility];  Service: General;  Laterality: N/A;    Social History Social History   Tobacco Use  . Smoking status: Former Smoker    Types: Cigarettes    Quit date: 04/18/2013    Years since quitting: 5.5  . Smokeless tobacco: Never Used  Substance Use Topics  . Alcohol use: No    Alcohol/week: 0.0 standard drinks  . Drug use: No    Family History Family History  Problem Relation Age of Onset  . Diabetes Mother   . Hypertension Father   . CAD Father   . Heart attack Father   . Colon cancer Brother     No family history of bleeding or clotting disorders, autoimmune disease or porphyria  Allergies  Allergen Reactions  . Buspirone Other (See Comments)    Weakness  . Citalopram Other (See Comments)  . Lisinopril Cough  . Metformin Diarrhea    At 1000 mg dose  . Pravastatin Other (See Comments)    insomnia  . Sitagliptin Other (See Comments)    constipation  . Tramadol Itching  . Diltiazem Hcl Palpitations  . Gabapentin Palpitations  . Hydralazine Rash  . Lovastatin Palpitations     REVIEW OF SYSTEMS (Negative unless checked)  Constitutional: [] Weight loss  [] Fever  [] Chills Cardiac: [] Chest pain   [] Chest pressure    [] Palpitations   [x] Shortness of breath when laying flat   [] Shortness of breath at rest   [x] Shortness of breath with exertion. Vascular:  [] Pain in legs with walking   [] Pain in legs at rest   [] Pain in legs when laying flat   [] Claudication   [] Pain in feet when walking  [] Pain in feet at rest  [] Pain in feet when laying flat   [] History of DVT   [] Phlebitis   [] Swelling in legs   [] Varicose veins   [] Non-healing ulcers Pulmonary:   [] Uses home oxygen   []   Productive cough   [] Hemoptysis   [] Wheeze  [] COPD   [] Asthma Neurologic:  [] Dizziness  [] Blackouts   [] Seizures   [] History of stroke   [] History of TIA  [] Aphasia   [] Temporary blindness   [] Dysphagia   [] Weakness or numbness in arms   [] Weakness or numbness in legs Musculoskeletal:  [] Arthritis   [] Joint swelling   [x] Joint pain   [] Low back pain Hematologic:  [] Easy bruising  [] Easy bleeding   [] Hypercoagulable state   [] Anemic  [] Hepatitis Gastrointestinal:  [] Blood in stool   [] Vomiting blood  [] Gastroesophageal reflux/heartburn   [] Difficulty swallowing. Genitourinary:  [x] Chronic kidney disease   [] Difficult urination  [] Frequent urination  [] Burning with urination   [] Blood in urine Skin:  [] Rashes   [] Ulcers   [] Wounds Psychological:  [] History of anxiety   []  History of major depression.  Physical Examination  Vitals:   10/20/18 1137  BP: (!) 124/96  Pulse: (!) 116  Temp: 97.8 F (36.6 C)  TempSrc: Oral  Weight: 104.3 kg  Height: 5\' 3"  (1.6 m)   Body mass index is 40.74 kg/m. Gen: WD/WN, NAD Head: [place]/AT, No temporalis wasting. Prominent temp pulse not noted. Ear/Nose/Throat: Hearing grossly intact, nares w/o erythema or drainage, oropharynx w/o Erythema/Exudate,  Eyes: Conjunctiva clear, sclera non-icteric Neck: Trachea midline.  No JVD.  Pulmonary:  Good air movement, respirations not labored, no use of accessory muscles.  Cardiac: RRR, normal S1, S2. Vascular:  Vessel Right Left  Radial Palpable Palpable   Gastrointestinal: soft, non-tender/non-distended. No guarding/reflex. Colostomy present Musculoskeletal: M/S 5/5 throughout.  Extremities without ischemic changes.  No deformity or atrophy.  Neurologic: Sensation grossly intact in extremities.  Symmetrical.  Speech is fluent. Motor exam as listed above. Psychiatric: Judgment intact, Mood & affect appropriate for pt's clinical situation. Dermatologic: No rashes or ulcers noted.  No cellulitis or open wounds. Lymph : No Cervical, Axillary, or Inguinal lymphadenopathy.   CBC Lab Results  Component Value Date   WBC 11.2 (H) 07/12/2018   HGB 12.0 07/12/2018   HCT 40.2 07/12/2018   MCV 87.0 07/12/2018   PLT 228 07/12/2018    BMET    Component Value Date/Time   NA 139 07/15/2018 0257   NA 143 07/19/2013 0523   K 4.4 07/15/2018 0257   K 3.7 07/19/2013 0523   CL 104 07/15/2018 0257   CL 109 (H) 07/19/2013 0523   CO2 25 07/15/2018 0257   CO2 29 07/19/2013 0523   GLUCOSE 137 (H) 07/15/2018 0257   GLUCOSE 110 (H) 07/19/2013 0523   BUN 55 (H) 07/15/2018 0257   BUN 8 07/19/2013 0523   CREATININE 3.39 (H) 07/15/2018 0257   CREATININE 1.58 (H) 07/19/2013 0523   CALCIUM 8.5 (L) 07/15/2018 0257   CALCIUM 7.8 (L) 07/19/2013 0523   GFRNONAA 13 (L) 07/15/2018 0257   GFRNONAA 34 (L) 07/19/2013 0523   GFRAA 15 (L) 07/15/2018 0257   GFRAA 40 (L) 07/19/2013 0523   CrCl cannot be calculated (Patient's most recent lab result is older than the maximum 21 days allowed.).  COAG Lab Results  Component Value Date   INR 1.28 02/22/2015   INR 1.1 07/17/2013    Radiology No results found.  Assessment/Plan 1.  Complication dialysis device with thrombosis AV access:  Patient will have a permacath placed and then follow up in the office for vein mapping.  The risks and benefits were described to the patient.  All questions were answered.  The patient agrees to proceed with intervention.  2.  End-stage renal disease requiring hemodialysis:   Patient will continue dialysis therapy without further interruption if a successful exchange is not achieved then new site will be found for tunneled catheter placement. Dialysis has already been arranged since the patient missed their previous session 3.  Hypertension:  Patient will continue medical management; nephrology is following no changes in oral medications. 4. Diabetes mellitus:  Glucose will be monitored and oral medications been held this morning once the patient has undergone the patient's procedure po intake will be reinitiated and again Accu-Cheks will be used to assess the blood glucose level and treat as needed. The patient will be restarted on the patient's usual hypoglycemic regime 5.  Coronary artery disease:  EKG will be monitored. Nitrates will be used if needed. [name] oral cardiac medications will be continued.    Hortencia Pilar, MD  10/20/2018 1:18 PM

## 2018-10-20 NOTE — Op Note (Signed)
OPERATIVE NOTE   PROCEDURE: 1. Insertion of tunneled dialysis catheter right IJ approach with ultrasound and fluoroscopic guidance.  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  ANESTHESIA: Conscious sedation was administered under my direct supervision by the interventional radiology RN. IV Versed plus fentanyl were utilized. Continuous ECG, pulse oximetry and blood pressure was monitored throughout the entire procedure. Conscious sedation was for a total of 30 minutes.  ESTIMATED BLOOD LOSS: Minimal cc  CONTRAST USED:  None  FLUOROSCOPY TIME: 0.3 minutes  INDICATIONS:   [name] a 68 y.o. y.o. female who presents with worsening of her renal function.  They have now made plans to initiate hemodialysis and she therefore requires appropriate access.  Risks and benefits for tunnel catheter placement of been reviewed all questions answered patient agrees to proceed..  DESCRIPTION: After obtaining full informed written consent, the patient was positioned supine. The right neck and chest wall was prepped and draped in a sterile fashion. Ultrasound was placed in a sterile sleeve. Ultrasound was utilized to identify the right internal jugular vein which is noted to be echolucent and compressible indicating patency. Image is recorded for the permanent record. Under direct ultrasound visualization a micro-needle is inserted into the vein followed by the micro-wire. Micro-sheath was then advanced and a J wire is inserted without difficulty under fluoroscopic guidance. Small counterincision was made at the wire insertion site. Dilators are passed over the wire and the tunneled dialysis catheter is fed into the central venous system without difficulty.  Under fluoroscopy the catheter tip positioned at the atrial caval junction. The catheter is then approximated to the right chest wall and an exit site  selected. 1% lidocaine is infiltrated in soft tissues at this level small incision is made and the tunneling device is then passed from the exit site to the right chest wall counterincision. Catheter is then connected to the tunneling device and the catheter was pulled subcutaneously. It is then transected and the hub assembly connected without difficulty. Both lumens aspirate and flush easily. After verification of smooth contour with proper tip position under fluoroscopy the catheter is packed with 5000 units of heparin per lumen.  Catheter secured to the skin of the right chest wall with 0 silk. A sterile dressing is applied with a Biopatch.  COMPLICATIONS: None  CONDITION: Good  Hortencia Pilar [place] [facility]. Office:  [phone]   10/20/2018,4:03 PM

## 2018-10-21 ENCOUNTER — Encounter: Payer: Self-pay | Admitting: Vascular Surgery

## 2018-11-05 ENCOUNTER — Ambulatory Visit: Payer: Medicare Other | Admitting: Pain Medicine

## 2018-11-05 DIAGNOSIS — M51379 Other intervertebral disc degeneration, lumbosacral region without mention of lumbar back pain or lower extremity pain: Secondary | ICD-10-CM | POA: Insufficient documentation

## 2018-11-05 DIAGNOSIS — M5137 Other intervertebral disc degeneration, lumbosacral region: Secondary | ICD-10-CM | POA: Insufficient documentation

## 2018-11-05 DIAGNOSIS — M47817 Spondylosis without myelopathy or radiculopathy, lumbosacral region: Secondary | ICD-10-CM | POA: Insufficient documentation

## 2018-11-05 NOTE — Progress Notes (Deleted)
NO SHOW

## 2018-11-10 ENCOUNTER — Encounter: Payer: Self-pay | Admitting: *Deleted

## 2018-11-10 ENCOUNTER — Other Ambulatory Visit: Payer: Self-pay

## 2018-11-11 NOTE — Anesthesia Preprocedure Evaluation (Addendum)
Anesthesia Evaluation  Patient identified by MRN, date of birth, ID band Patient awake    Reviewed: Allergy & Precautions, H&P , NPO status , Patient's Chart, lab work & pertinent test results  Airway Mallampati: III  TM Distance: >3 FB Neck ROM: full    Dental no notable dental hx.    Pulmonary shortness of breath and with exertion, sleep apnea and Continuous Positive Airway Pressure Ventilation , former smoker (quit 2015),    Pulmonary exam normal breath sounds clear to auscultation       Cardiovascular hypertension, +CHF  Normal cardiovascular exam+ dysrhythmias (a fib and PSVT on Eliquis) + Valvular Problems/Murmurs (severe AS s/p TAVR 2019)  Rhythm:regular Rate:Normal     Neuro/Psych PSYCHIATRIC DISORDERS Anxiety Depression Vertigo   Neuromuscular disease (polyneuropathy)    GI/Hepatic   Endo/Other  diabetes, Type 2, Insulin DependentHypothyroidism Morbid obesityObesity   Renal/GU 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 Paroxysmal Atrial fibrillation: On EKG completed in the office today the patient is in sinus rhythm. She spontaneously converted to sinus rhythm since her last visit. Her heart rate is now 56 BPM, so we will reduce metoprolol dosing. Plan will be to continue amiodarone at current dose (200 mg daily) for at least three months. Consider reduction to 100 mg daily at that visit. Given patient's age, long term amiodarone use may not be best treatment strategy due to possible side effects. Will consider switching to a different antiarrhythmic once patient has been in sinus rhythm continuously for at least three months.  - CHA2DS2-VASc score of 4 (Age >65 - 1, HTN - 1, DM - 1, CHF - 1).  Anticoagulated on Eliquis 5 mg BID. No bleeding symptoms reported. - She continues to take amiodarone 200 mg daily for rhythm control. - Recent sleep study completed on 08/17/2018 showed patient had moderate sleep apnea with AHI of 19. Recent labs show no evidence of anemia or thyroid dysfunction. She has yet to begin using a CPAP machine but hopes to receive her medical equipment soon.  - Continue Eliquis - Continue amiodarone at 200 mg daily  - Reduce metoprolol succinate to 25 mg BID (split dosing per pt preference) due to ongoing feelings of fatigue in the setting of bradycardia   2. Heart failure with reduced ejection fraction: Recent ECHO from 06/2018 with EF of 25% due to globally reduced LV function. NHYA Class III. Patient recently restarted on her ARB. She also remains on metoprolol for management of heart failure. She is not a goof candidate for Entresto due to her renal function - GFR of 15 on last CMP. She reports no lower extremity swelling or other symptoms of CHF exacerbation. She remains on torsemide for fluid management.   3. Hx of severe AS s/p TAVR in 08/2017: ECHO from 06/2018 with fully mobile prosthetic aortic valve. Peak velocity of 2.1 m/s. No significant changes from prior suggesting normal functioning prosthetic valve. Continue to monitor and repeat imaging as needed.   4. HTN: Currently on amlodipine 5 mg daily and telmisartan 20 mg daily for blood pressure control. Blood pressure is well controlled today. Continue current regimen.   5. HLD: Currently on high intensity statin therapy with atorvastatin 40 mg daily. Tolerating statin therapy well. Continue  current regimen.   Orders Placed This Encounter  Procedures  . ECG 12-lead   Return in about 3 months (around 12/11/2018).  MICHELLE Jimmey Ralph, PA-C   Reproductive/Obstetrics                            Anesthesia Physical Anesthesia Plan  ASA: III  Anesthesia Plan: MAC    Post-op Pain Management:    Induction:   PONV Risk Score and Plan: 2 and Midazolam, Treatment may vary due to age or medical condition and TIVA  Airway Management Planned:   Additional Equipment:   Intra-op Plan:   Post-operative Plan:   Informed Consent: I have reviewed the patients History and Physical, chart, labs and discussed the procedure including the risks, benefits and alternatives for the proposed anesthesia with the patient or authorized representative who has indicated his/her understanding and acceptance.       Plan Discussed with: CRNA  Anesthesia Plan Comments:         Anesthesia Quick Evaluation

## 2018-11-12 ENCOUNTER — Other Ambulatory Visit: Payer: Self-pay

## 2018-11-12 ENCOUNTER — Other Ambulatory Visit
Admission: RE | Admit: 2018-11-12 | Discharge: 2018-11-12 | Disposition: A | Discharge status: Home / Self Care | Payer: Medicare Other | Source: Ambulatory Visit | Attending: Ophthalmology | Admitting: Ophthalmology

## 2018-11-12 DIAGNOSIS — Z01812 Encounter for preprocedural laboratory examination: Secondary | ICD-10-CM | POA: Diagnosis present

## 2018-11-12 DIAGNOSIS — Z20828 Contact with and (suspected) exposure to other viral communicable diseases: Secondary | ICD-10-CM | POA: Diagnosis not present

## 2018-11-12 NOTE — Discharge Instructions (Signed)

## 2018-11-13 LAB — SARS CORONAVIRUS 2 (TAT 6-24 HRS): SARS Coronavirus 2: NEGATIVE

## 2018-11-16 ENCOUNTER — Ambulatory Visit: Payer: Medicare Other | Admitting: Anesthesiology

## 2018-11-16 ENCOUNTER — Ambulatory Visit
Admission: RE | Admit: 2018-11-16 | Discharge: 2018-11-16 | Disposition: A | Discharge status: Home / Self Care | Payer: Medicare Other | Attending: Ophthalmology | Admitting: Ophthalmology

## 2018-11-16 ENCOUNTER — Encounter
Admission: RE | Disposition: A | Discharge status: Home / Self Care | Payer: Self-pay | Source: Home / Self Care | Attending: Ophthalmology

## 2018-11-16 ENCOUNTER — Telehealth: Payer: Self-pay | Admitting: Pain Medicine

## 2018-11-16 ENCOUNTER — Other Ambulatory Visit: Payer: Self-pay

## 2018-11-16 DIAGNOSIS — F419 Anxiety disorder, unspecified: Secondary | ICD-10-CM | POA: Insufficient documentation

## 2018-11-16 DIAGNOSIS — Z952 Presence of prosthetic heart valve: Secondary | ICD-10-CM | POA: Insufficient documentation

## 2018-11-16 DIAGNOSIS — Z992 Dependence on renal dialysis: Secondary | ICD-10-CM | POA: Diagnosis not present

## 2018-11-16 DIAGNOSIS — Z79890 Hormone replacement therapy (postmenopausal): Secondary | ICD-10-CM | POA: Insufficient documentation

## 2018-11-16 DIAGNOSIS — I509 Heart failure, unspecified: Secondary | ICD-10-CM | POA: Insufficient documentation

## 2018-11-16 DIAGNOSIS — E1142 Type 2 diabetes mellitus with diabetic polyneuropathy: Secondary | ICD-10-CM | POA: Insufficient documentation

## 2018-11-16 DIAGNOSIS — N186 End stage renal disease: Secondary | ICD-10-CM | POA: Insufficient documentation

## 2018-11-16 DIAGNOSIS — F329 Major depressive disorder, single episode, unspecified: Secondary | ICD-10-CM | POA: Insufficient documentation

## 2018-11-16 DIAGNOSIS — I132 Hypertensive heart and chronic kidney disease with heart failure and with stage 5 chronic kidney disease, or end stage renal disease: Secondary | ICD-10-CM | POA: Insufficient documentation

## 2018-11-16 DIAGNOSIS — I4891 Unspecified atrial fibrillation: Secondary | ICD-10-CM | POA: Diagnosis not present

## 2018-11-16 DIAGNOSIS — Z79891 Long term (current) use of opiate analgesic: Secondary | ICD-10-CM | POA: Diagnosis not present

## 2018-11-16 DIAGNOSIS — G4730 Sleep apnea, unspecified: Secondary | ICD-10-CM | POA: Diagnosis not present

## 2018-11-16 DIAGNOSIS — Z7901 Long term (current) use of anticoagulants: Secondary | ICD-10-CM | POA: Insufficient documentation

## 2018-11-16 DIAGNOSIS — Z79899 Other long term (current) drug therapy: Secondary | ICD-10-CM | POA: Insufficient documentation

## 2018-11-16 DIAGNOSIS — H2511 Age-related nuclear cataract, right eye: Secondary | ICD-10-CM | POA: Insufficient documentation

## 2018-11-16 DIAGNOSIS — Z6841 Body Mass Index (BMI) 40.0 and over, adult: Secondary | ICD-10-CM | POA: Insufficient documentation

## 2018-11-16 DIAGNOSIS — Z794 Long term (current) use of insulin: Secondary | ICD-10-CM | POA: Insufficient documentation

## 2018-11-16 DIAGNOSIS — I4819 Other persistent atrial fibrillation: Secondary | ICD-10-CM | POA: Insufficient documentation

## 2018-11-16 DIAGNOSIS — E039 Hypothyroidism, unspecified: Secondary | ICD-10-CM | POA: Insufficient documentation

## 2018-11-16 DIAGNOSIS — M549 Dorsalgia, unspecified: Secondary | ICD-10-CM | POA: Diagnosis not present

## 2018-11-16 DIAGNOSIS — E1122 Type 2 diabetes mellitus with diabetic chronic kidney disease: Secondary | ICD-10-CM | POA: Insufficient documentation

## 2018-11-16 DIAGNOSIS — Z87891 Personal history of nicotine dependence: Secondary | ICD-10-CM | POA: Diagnosis not present

## 2018-11-16 DIAGNOSIS — G8929 Other chronic pain: Secondary | ICD-10-CM | POA: Diagnosis not present

## 2018-11-16 HISTORY — DX: Dependence on renal dialysis: Z99.2

## 2018-11-16 HISTORY — PX: CATARACT EXTRACTION W/PHACO: SHX586

## 2018-11-16 HISTORY — DX: Sleep apnea, unspecified: G47.30

## 2018-11-16 LAB — GLUCOSE, CAPILLARY
Glucose-Capillary: 148 mg/dL — ABNORMAL HIGH (ref 70–99)
Glucose-Capillary: 170 mg/dL — ABNORMAL HIGH (ref 70–99)

## 2018-11-16 SURGERY — PHACOEMULSIFICATION, CATARACT, WITH IOL INSERTION
Anesthesia: Monitor Anesthesia Care | Site: Eye | Laterality: Right

## 2018-11-16 MED ORDER — TETRACAINE HCL 0.5 % OP SOLN
1.0000 [drp] | OPHTHALMIC | Status: DC | PRN
  Administered 2018-11-16 (×3): 1 [drp] via OPHTHALMIC

## 2018-11-16 MED ORDER — LIDOCAINE HCL (PF) 2 % IJ SOLN
INTRAOCULAR | Status: DC | PRN
  Administered 2018-11-16: 1 mL via INTRAOCULAR

## 2018-11-16 MED ORDER — LACTATED RINGERS IV SOLN: INTRAVENOUS | Status: DC

## 2018-11-16 MED ORDER — MIDAZOLAM HCL 2 MG/2ML IJ SOLN
INTRAMUSCULAR | Status: DC | PRN
  Administered 2018-11-16: 1 mg via INTRAVENOUS

## 2018-11-16 MED ORDER — EPINEPHRINE PF 1 MG/ML IJ SOLN
INTRAOCULAR | Status: DC | PRN
  Administered 2018-11-16: 80 mL via OPHTHALMIC

## 2018-11-16 MED ORDER — ACETAMINOPHEN 160 MG/5ML PO SOLN: 325.0000 mg | Freq: Once | ORAL | Status: DC

## 2018-11-16 MED ORDER — ACETAMINOPHEN 325 MG PO TABS: 325.0000 mg | ORAL_TABLET | Freq: Once | ORAL | Status: DC

## 2018-11-16 MED ORDER — SODIUM HYALURONATE 23 MG/ML IO SOLN
INTRAOCULAR | Status: DC | PRN
  Administered 2018-11-16: 0.6 mL via INTRAOCULAR

## 2018-11-16 MED ORDER — MOXIFLOXACIN HCL 0.5 % OP SOLN
OPHTHALMIC | Status: DC | PRN
  Administered 2018-11-16: 0.2 mL via OPHTHALMIC

## 2018-11-16 MED ORDER — ARMC OPHTHALMIC DILATING DROPS
1.0000 "application " | OPHTHALMIC | Status: DC | PRN
  Administered 2018-11-16 (×3): 1 via OPHTHALMIC

## 2018-11-16 MED ORDER — SODIUM HYALURONATE 10 MG/ML IO SOLN
INTRAOCULAR | Status: DC | PRN
  Administered 2018-11-16: 0.55 mL via INTRAOCULAR

## 2018-11-16 SURGICAL SUPPLY — 17 items
CANNULA ANT/CHMB 27GA (MISCELLANEOUS) ×6 IMPLANT
DISSECTOR HYDRO NUCLEUS 50X22 (MISCELLANEOUS) ×3 IMPLANT
GLOVE SURG LX 7.5 STRW (GLOVE) ×2
GLOVE SURG LX STRL 7.5 STRW (GLOVE) ×1 IMPLANT
GLOVE SURG SYN 8.5  E (GLOVE) ×2
GLOVE SURG SYN 8.5 E (GLOVE) ×1 IMPLANT
GOWN STRL REUS W/ TWL LRG LVL3 (GOWN DISPOSABLE) ×2 IMPLANT
GOWN STRL REUS W/TWL LRG LVL3 (GOWN DISPOSABLE) ×4
LENS IOL TECNIS ITEC 23.5 (Intraocular Lens) ×3 IMPLANT
MARKER SKIN DUAL TIP RULER LAB (MISCELLANEOUS) ×3 IMPLANT
PACK DR. KING ARMS (PACKS) ×3 IMPLANT
PACK EYE AFTER SURG (MISCELLANEOUS) ×3 IMPLANT
PACK OPTHALMIC (MISCELLANEOUS) ×3 IMPLANT
SYR 3ML LL SCALE MARK (SYRINGE) ×3 IMPLANT
SYR TB 1ML LUER SLIP (SYRINGE) ×3 IMPLANT
WATER STERILE IRR 250ML POUR (IV SOLUTION) ×3 IMPLANT
WIPE NON LINTING 3.25X3.25 (MISCELLANEOUS) ×3 IMPLANT

## 2018-11-16 NOTE — Op Note (Signed)
OPERATIVE NOTE  PEGGE CUMBERLEDGE [id number] 11/16/2018   PREOPERATIVE DIAGNOSIS:  Nuclear sclerotic cataract right eye.  H25.11   POSTOPERATIVE DIAGNOSIS:    Nuclear sclerotic cataract right eye.     PROCEDURE:  Phacoemusification with posterior chamber intraocular lens placement of the right eye   LENS:   Implant Name Type Inv. Item Serial No. Manufacturer Lot No. LRB No. Used Action  LENS IOL DIOP 23.5 - [id number] Intraocular Lens LENS IOL DIOP 23.5 [id number] AMO  Right 1 Implanted       Procedure(s) with comments: CATARACT EXTRACTION PHACO AND INTRAOCULAR LENS PLACEMENT (IOC) RIGHT DIABETiC  01:18.3  13.5%  10.82 (Right) - Diabetic - insulin sleep apnea  PCB00 +23.5   SURGEON:  Benay Pillow, MD, MPH  ANESTHESIOLOGIST: Anesthesiologist: Ronelle Nigh, MD CRNA: Silvana Newness, CRNA   ANESTHESIA:  Topical with tetracaine drops augmented with 1% preservative-free intracameral lidocaine.  ESTIMATED BLOOD LOSS: less than 1 mL.   COMPLICATIONS:  None.   DESCRIPTION OF PROCEDURE:  The patient was identified in the holding room and transported to the operating room and placed in the supine position under the operating microscope.  The right eye was identified as the operative eye and it was prepped and draped in the usual sterile ophthalmic fashion.   A 1.0 millimeter clear-corneal paracentesis was made at the 10:30 position. 0.5 ml of preservative-free 1% lidocaine with epinephrine was injected into the anterior chamber.  The anterior chamber was filled with Healon 5 viscoelastic.  A 2.4 millimeter keratome was used to make a near-clear corneal incision at the 8:00 position.  A curvilinear capsulorrhexis was made with a cystotome and capsulorrhexis forceps.  Balanced salt solution was used to hydrodissect and hydrodelineate the nucleus.   Phacoemulsification was then used in stop and chop fashion to remove the lens nucleus and epinucleus.  The remaining cortex was then removed  using the irrigation and aspiration handpiece. Healon was then placed into the capsular bag to distend it for lens placement.  A lens was then injected into the capsular bag.  The remaining viscoelastic was aspirated.   Wounds were hydrated with balanced salt solution.  The anterior chamber was inflated to a physiologic pressure with balanced salt solution.   Intracameral vigamox 0.1 mL undiluted was injected into the eye and a drop placed onto the ocular surface.  No wound leaks were noted.  The patient was taken to the recovery room in stable condition without complications of anesthesia or surgery  Benay Pillow 11/16/2018, 10:36 AM

## 2018-11-16 NOTE — Telephone Encounter (Addendum)
lvmail stating she wants someone to call her. I called to speak with patient and was informed she is having cataract surgery at this time. I told them to have her call us is she still needs to speak with someone.

## 2018-11-16 NOTE — Transfer of Care (Signed)
Immediate Anesthesia Transfer of Care Note  Patient: [name]  Procedure(s) Performed: CATARACT EXTRACTION PHACO AND INTRAOCULAR LENS PLACEMENT (IOC) RIGHT DIABETiC  01:18.3  13.5%  10.82 (Right Eye)  Patient Location: PACU  Anesthesia Type: MAC  Level of Consciousness: awake, alert  and patient cooperative  Airway and Oxygen Therapy: Patient Spontanous Breathing and Patient connected to supplemental oxygen  Post-op Assessment: Post-op Vital signs reviewed, Patient's Cardiovascular Status Stable, Respiratory Function Stable, Patent Airway and No signs of Nausea or vomiting  Post-op Vital Signs: Reviewed and stable  Complications: No apparent anesthesia complications

## 2018-11-16 NOTE — Telephone Encounter (Signed)
My apologies, did not mean to send to pm. Please disregard.

## 2018-11-16 NOTE — H&P (Signed)

## 2018-11-16 NOTE — Anesthesia Postprocedure Evaluation (Signed)
Anesthesia Post Note  Patient: [name]  Procedure(s) Performed: CATARACT EXTRACTION PHACO AND INTRAOCULAR LENS PLACEMENT (IOC) RIGHT DIABETiC  01:18.3  13.5%  10.82 (Right Eye)  Patient location during evaluation: PACU Anesthesia Type: MAC Level of consciousness: awake and alert and oriented Pain management: satisfactory to patient Vital Signs Assessment: post-procedure vital signs reviewed and stable Respiratory status: spontaneous breathing, nonlabored ventilation and respiratory function stable Cardiovascular status: blood pressure returned to baseline and stable Postop Assessment: Adequate PO intake and No signs of nausea or vomiting Anesthetic complications: no    Raliegh Ip

## 2018-11-27 ENCOUNTER — Other Ambulatory Visit (INDEPENDENT_AMBULATORY_CARE_PROVIDER_SITE_OTHER): Payer: Self-pay | Admitting: Vascular Surgery

## 2018-11-27 DIAGNOSIS — N186 End stage renal disease: Secondary | ICD-10-CM

## 2018-11-30 ENCOUNTER — Encounter (INDEPENDENT_AMBULATORY_CARE_PROVIDER_SITE_OTHER): Payer: Self-pay | Admitting: Vascular Surgery

## 2018-11-30 ENCOUNTER — Ambulatory Visit (INDEPENDENT_AMBULATORY_CARE_PROVIDER_SITE_OTHER): Payer: Medicare Other

## 2018-11-30 ENCOUNTER — Ambulatory Visit (INDEPENDENT_AMBULATORY_CARE_PROVIDER_SITE_OTHER): Payer: Medicare Other | Admitting: Vascular Surgery

## 2018-11-30 ENCOUNTER — Other Ambulatory Visit: Payer: Self-pay

## 2018-11-30 ENCOUNTER — Encounter (INDEPENDENT_AMBULATORY_CARE_PROVIDER_SITE_OTHER): Payer: Self-pay

## 2018-11-30 VITALS — BP 147/91 | HR 87 | Resp 16 | Ht 63.0 in | Wt 241.6 lb

## 2018-11-30 DIAGNOSIS — I480 Paroxysmal atrial fibrillation: Secondary | ICD-10-CM

## 2018-11-30 DIAGNOSIS — E1142 Type 2 diabetes mellitus with diabetic polyneuropathy: Secondary | ICD-10-CM

## 2018-11-30 DIAGNOSIS — I10 Essential (primary) hypertension: Secondary | ICD-10-CM | POA: Diagnosis not present

## 2018-11-30 DIAGNOSIS — N186 End stage renal disease: Secondary | ICD-10-CM | POA: Diagnosis not present

## 2018-11-30 NOTE — Progress Notes (Signed)
MRN : [id number]  [name] is a 68 y.o. (1950/08/17) female who presents with chief complaint of No chief complaint on file. Marland Kitchen  History of Present Illness:    The patient is seen for evaluation of dialysis access.  The patient has a history of multiple failed accesses.  There have been accesses in both arms and in the thighs.    Current access is via a catheter which is functioning poorly.  There have been several episodes of catheter infection.  The patient denies amaurosis fugax or recent TIA symptoms. There are no recent neurological changes noted. The patient denies claudication symptoms or rest pain symptoms. The patient denies history of DVT, PE or superficial thrombophlebitis. The patient denies recent episodes of angina or shortness of breath.    No outpatient medications have been marked as taking for the 11/30/18 encounter (Appointment) with Delana Meyer, Dolores Lory, MD.    Past Medical History:  Diagnosis Date  . Anemia   . Anxiety   . Aortic valve stenosis   . Arrhythmia    atrial fibrillation  . Arthritis    feet, legs  . B12 deficiency   . Bowel obstruction ([place])   . CHF (congestive heart failure) ([place])   . CKD (chronic kidney disease)    protein in urine  . Colostomy in place [facility])   . Diabetes mellitus without complication ([place])   . Dialysis patient [facility])    T/Th/Sa  . Diverticulitis large intestine   . Dysrhythmia   . 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 FSGS (focal segmental glomerulosclerosis)   . Gastritis   . GI bleed   . Heart murmur   . Hyperlipidemia   . Hypertension   . Hypothyroidism   . Hypothyroidism    unspecified  . MRSA (methicillin resistant Staphylococcus aureus)    at abdominal wound.  Jan 2017.  Treated.   . Neuropathy   . Neuropathy in diabetes ([place])   . Obesity   . Pedal edema   . Psoriasis   . Shortness of breath dyspnea   . Sleep apnea    CPAP  . Vertigo     Past Surgical History:  Procedure Laterality Date  .  ABDOMINAL HYSTERECTOMY    . APPLICATION OF WOUND VAC N/A 02/24/2015   Procedure: APPLICATION OF WOUND VAC;  Surgeon: Clayburn Pert, MD;  Location: [facility];  Service: General;  Laterality: N/A;  . APPLICATION OF WOUND VAC N/A 02/26/2015   Procedure: APPLICATION OF WOUND VAC;  Surgeon: Clayburn Pert, MD;  Location: [facility];  Service: General;  Laterality: N/A;  . BACK SURGERY     spur frmoved from lower back  . CARDIAC CATHETERIZATION    . CARDIAC VALVE REPLACEMENT  08/2017   [facility]  . CARDIOVERSION N/A 07/15/2018   Procedure: CARDIOVERSION;  Surgeon: Corey Skains, MD;  Location: [facility];  Service: Cardiovascular;  Laterality: N/A;  . CATARACT EXTRACTION W/PHACO Right 11/16/2018   Procedure: CATARACT EXTRACTION PHACO AND INTRAOCULAR LENS PLACEMENT (IOC) RIGHT DIABETiC  01:18.3  13.5%  10.82;  Surgeon: Eulogio Bear, MD;  Location: [place];  Service: Ophthalmology;  Laterality: Right;  Diabetic - insulin sleep apnea  . COLECTOMY    . COLONOSCOPY WITH PROPOFOL N/A 09/04/2016   Procedure: COLONOSCOPY WITH PROPOFOL;  Surgeon: Manya Silvas, MD;  Location: [facility] ENDOSCOPY;  Service: Endoscopy;  Laterality: N/A;  . COLOSTOMY REVERSAL N/A 07/12/2015   Procedure: COLOSTOMY REVERSAL;  Surgeon: Clayburn Pert, MD;  Location: [facility];  Service: General;  Laterality: N/A;  . DEBRIDEMENT OF ABDOMINAL WALL ABSCESS N/A 02/22/2015   Procedure: DEBRIDEMENT OF ABDOMINAL WALL ABSCESS;  Surgeon: Clayburn Pert, MD;  Location: [facility];  Service: General;  Laterality: N/A;  . DIALYSIS/PERMA CATHETER INSERTION N/A 10/20/2018   Procedure: DIALYSIS/PERMA CATHETER INSERTION;  Surgeon: Katha Cabal, MD;  Location: [place] CV LAB;  Service: Cardiovascular;  Laterality: N/A;  . EXCISION MASS ABDOMINAL N/A 02/24/2015   Procedure: EXCISION MASS ABDOMINAL  / [place];  Surgeon: Clayburn Pert, MD;  Location: [facility];  Service: General;  Laterality: N/A;  . EXCISION MASS ABDOMINAL N/A  02/26/2015   Procedure: EXCISION MASS ABDOMINAL/wash out;  Surgeon: Clayburn Pert, MD;  Location: [facility];  Service: General;  Laterality: N/A;  . FLEXIBLE SIGMOIDOSCOPY  06/19/2015   Procedure: FLEXIBLE SIGMOIDOSCOPY;  Surgeon: Lucilla Lame, MD;  Location: [place];  Service: Endoscopy;;  UNABLE TO ACCESS OSTOMY SITE FOR ACCESS INTO COLON  . LAPAROSCOPY  07/12/2015   Procedure: LAPAROSCOPY DIAGNOSTIC;  Surgeon: Clayburn Pert, MD;  Location: [facility];  Service: General;;  . LAPAROTOMY  07/12/2015   Procedure: EXPLORATORY LAPAROTOMY;  Surgeon: Clayburn Pert, MD;  Location: [facility];  Service: General;;  . LAPAROTOMY N/A 02/06/2015   Procedure: Laparotomy, reduction of incarcerated parastomal hernia, repair of parastomal hernia with mesh;  Surgeon: Sherri Rad, MD;  Location: [facility];  Service: General;  Laterality: N/A;  . laparotomy closure of cecal perforation  05/09/2013   Dr. Marina Gravel  . LYSIS OF ADHESION  07/12/2015   Procedure: LYSIS OF ADHESION;  Surgeon: Clayburn Pert, MD;  Location: [facility];  Service: General;;  . TONSILLECTOMY    . WOUND DEBRIDEMENT N/A 03/03/2015   Procedure: DEBRIDEMENT ABDOMINAL WOUND;  Surgeon: Florene Glen, MD;  Location: [facility];  Service: General;  Laterality: N/A;  . WOUND DEBRIDEMENT N/A 03/09/2015   Procedure: DEBRIDEMENT ABDOMINAL WOUND;  Surgeon: Florene Glen, MD;  Location: [facility];  Service: General;  Laterality: N/A;    Social History Social History   Tobacco Use  . Smoking status: Former Smoker    Types: Cigarettes    Quit date: 04/18/2013    Years since quitting: 5.6  . Smokeless tobacco: Never Used  Substance Use Topics  . Alcohol use: No    Alcohol/week: 0.0 standard drinks  . Drug use: No    Family History Family History  Problem Relation Age of Onset  . Diabetes Mother   . Hypertension Father   . CAD Father   . Heart attack Father   . Colon cancer Brother   No family history of bleeding/clotting disorders,  porphyria or autoimmune disease   Allergies  Allergen Reactions  . Buspirone Other (See Comments)    Weakness  . Citalopram Other (See Comments)  . Lisinopril Cough  . Metformin Diarrhea    At 1000 mg dose  . Pravastatin Other (See Comments)    insomnia  . Sitagliptin Other (See Comments)    constipation  . Tramadol Itching  . Diltiazem Hcl Palpitations  . Gabapentin Palpitations  . Hydralazine Rash  . Lovastatin Palpitations     REVIEW OF SYSTEMS (Negative unless checked)  Constitutional: [] Weight loss  [] Fever  [] Chills Cardiac: [] Chest pain   [] Chest pressure   [] Palpitations   [] Shortness of breath when laying flat   [] Shortness of breath with exertion. Vascular:  [] Pain in legs with walking   [] Pain in legs at rest  [] History of DVT   []   Phlebitis   [] Swelling in legs   [] Varicose veins   [] Non-healing ulcers Pulmonary:   [] Uses home oxygen   [] Productive cough   [] Hemoptysis   [] Wheeze  [] COPD   [] Asthma Neurologic:  [] Dizziness   [] Seizures   [] History of stroke   [] History of TIA  [] Aphasia   [] Vissual changes   [] Weakness or numbness in arm   [] Weakness or numbness in leg Musculoskeletal:   [] Joint swelling   [] Joint pain   [] Low back pain Hematologic:  [] Easy bruising  [] Easy bleeding   [] Hypercoagulable state   [] Anemic Gastrointestinal:  [] Diarrhea   [] Vomiting  [] Gastroesophageal reflux/heartburn   [] Difficulty swallowing. Genitourinary:  [x] Chronic kidney disease   [] Difficult urination  [] Frequent urination   [] Blood in urine Skin:  [] Rashes   [] Ulcers  Psychological:  [] History of anxiety   []  History of major depression.  Physical Examination  There were no vitals filed for this visit. There is no height or weight on file to calculate BMI. Gen: WD/WN, NAD Head: [place]/AT, No temporalis wasting.  Ear/Nose/Throat: Hearing grossly intact, nares w/o erythema or drainage, poor dentition Eyes: PER, EOMI, sclera nonicteric.  Neck: Supple, no masses.  No bruit or JVD.   Pulmonary:  Good air movement, clear to auscultation bilaterally, no use of accessory muscles.  Cardiac: RRR, normal S1, S2, no Murmurs. Vascular:  Cephalic vein noted bilateral antecubital fossa Vessel Right Left  Radial Palpable Palpable  Brachial Palpable Palpable  Carotid Palpable Palpable  Gastrointestinal: soft, non-distended. No guarding/no peritoneal signs.  Musculoskeletal: M/S 5/5 throughout.  No deformity or atrophy.  Neurologic: CN 2-12 intact. Pain and light touch intact in extremities.  Symmetrical.  Speech is fluent. Motor exam as listed above. Psychiatric: Judgment intact, Mood & affect appropriate for pt's clinical situation. Dermatologic: No rashes or ulcers noted.  No changes consistent with cellulitis. Lymph : No Cervical lymphadenopathy, no lichenification or skin changes of chronic lymphedema.  CBC Lab Results  Component Value Date   WBC 11.2 (H) 07/12/2018   HGB 12.0 07/12/2018   HCT 40.2 07/12/2018   MCV 87.0 07/12/2018   PLT 228 07/12/2018    BMET    Component Value Date/Time   NA 139 07/15/2018 0257   NA 143 07/19/2013 0523   K 4.4 07/15/2018 0257   K 3.7 07/19/2013 0523   CL 104 07/15/2018 0257   CL 109 (H) 07/19/2013 0523   CO2 25 07/15/2018 0257   CO2 29 07/19/2013 0523   GLUCOSE 137 (H) 07/15/2018 0257   GLUCOSE 110 (H) 07/19/2013 0523   BUN 55 (H) 07/15/2018 0257   BUN 8 07/19/2013 0523   CREATININE 3.39 (H) 07/15/2018 0257   CREATININE 1.58 (H) 07/19/2013 0523   CALCIUM 8.5 (L) 07/15/2018 0257   CALCIUM 7.8 (L) 07/19/2013 0523   GFRNONAA 13 (L) 07/15/2018 0257   GFRNONAA 34 (L) 07/19/2013 0523   GFRAA 15 (L) 07/15/2018 0257   GFRAA 40 (L) 07/19/2013 0523   CrCl cannot be calculated (Patient's most recent lab result is older than the maximum 21 days allowed.).  COAG Lab Results  Component Value Date   INR 1.28 02/22/2015   INR 1.1 07/17/2013    Radiology No results found.   Assessment/Plan 1. End stage renal disease ([place])  Recommend:  At this time the patient does not have appropriate extremity access for dialysis  Patient should have a left brachial cephalic fistula created.  The risks, benefits and alternative therapies were reviewed in detail with the patient.  All questions were answered.  The patient agrees to proceed with surgery.    2. Benign essential HTN Continue antihypertensive medications as already ordered, these medications have been reviewed and there are no changes at this time.   3. Paroxysmal A-fib (HCC) Continue antiarrhythmia medications as already ordered, these medications have been reviewed and there are no changes at this time.  Continue anticoagulation as ordered by Cardiology Service   4. DM type 2 with diabetic peripheral neuropathy ([place]) Continue hypoglycemic medications as already ordered, these medications have been reviewed and there are no changes at this time.  Hgb A1C to be monitored as already arranged by primary service     Hortencia Pilar, MD  11/30/2018 1:20 PM

## 2018-12-01 ENCOUNTER — Encounter: Payer: Self-pay | Admitting: *Deleted

## 2018-12-01 ENCOUNTER — Other Ambulatory Visit: Payer: Self-pay

## 2018-12-02 NOTE — Anesthesia Preprocedure Evaluation (Addendum)
Anesthesia Evaluation    Reviewed: H&P   Airway Mallampati: III  TM Distance: >3 FB Neck ROM: full    Dental no notable dental hx.    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 Paroxysmal Atrial fibrillation: On EKG completed in the office today the patient is in sinus rhythm. She spontaneously converted to sinus rhythm since her last visit. Her heart rate is now 56 BPM, so we will reduce metoprolol dosing. Plan will be to continue amiodarone at current dose (200 mg daily) for at least three months. Consider reduction to 100 mg daily at that visit. Given patient's age, long term amiodarone use may not be best treatment strategy due to possible side effects. Will consider switching to a different antiarrhythmic once patient has been in sinus rhythm continuously for at least three months.  - CHA2DS2-VASc score of 4 (Age >65 - 1, HTN - 1, DM - 1, CHF - 1). Anticoagulated on Eliquis 5 mg BID. No bleeding symptoms reported. - She continues to take amiodarone 200 mg daily for rhythm control. - Recent sleep study completed on 08/17/2018 showed patient had moderate sleep apnea with AHI of 19. Recent labs show no evidence of anemia or thyroid  dysfunction. She has yet to begin using a CPAP machine but hopes to receive her medical equipment soon.  - Continue Eliquis - Continue amiodarone at 200 mg daily  - Reduce metoprolol succinate to 25 mg BID (split dosing per pt preference) due to ongoing feelings of fatigue in the setting of bradycardia   2. Heart failure with reduced ejection fraction: Recent ECHO from 06/2018 with EF of 25% due to globally reduced LV function. NHYA Class III. Patient recently restarted on her ARB. She also remains on metoprolol for management of heart failure. She is not a goof candidate for Entresto due to her renal function - GFR of 15 on last CMP. She reports no lower extremity swelling or other symptoms of CHF exacerbation. She remains on torsemide for fluid management.   3. Hx of severe AS s/p TAVR in 08/2017: ECHO from 06/2018 with fully mobile prosthetic aortic valve. Peak velocity of 2.1 m/s. No significant changes from prior suggesting normal functioning prosthetic valve. Continue to monitor and repeat imaging as needed.   4. HTN: Currently on amlodipine 5 mg daily and telmisartan 20 mg daily for blood pressure control. Blood pressure is well controlled today. Continue current regimen.   5. HLD: Currently on high intensity statin therapy with atorvastatin 40 mg daily. Tolerating statin therapy well. Continue current regimen.   Orders Placed This Encounter  Procedures  . ECG 12-lead   Return in about 3 months (around 12/11/2018).  MICHELLE Jimmey Ralph, PA-C   Reproductive/Obstetrics  Anesthesia Evaluation  Patient identified by MRN, date of birth, ID band Patient awake    Reviewed: Allergy & Precautions, H&P , NPO status , Patient's Chart, lab work & pertinent test results  Airway Mallampati: III  TM Distance: >3 FB Neck ROM: full    Dental no notable dental hx.    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 Paroxysmal Atrial fibrillation: On EKG completed in the office today the patient is in sinus rhythm. She spontaneously converted to sinus rhythm since her last visit. Her heart rate is now 56 BPM, so we will reduce metoprolol dosing. Plan will be to continue amiodarone at current dose (200 mg daily) for at least three months. Consider reduction to 100 mg daily at that visit. Given patient's age, long term amiodarone use may not be best treatment strategy due to possible side effects. Will consider switching to a different antiarrhythmic once patient has been in sinus rhythm continuously for at least three months.  - CHA2DS2-VASc score of 4 (Age >65 - 1, HTN - 1, DM - 1, CHF - 1). Anticoagulated on Eliquis 5 mg BID. No bleeding symptoms reported. - She continues to take amiodarone 200 mg daily for rhythm control. - Recent sleep study completed on 08/17/2018 showed patient had moderate sleep apnea with AHI of 19. Recent labs show no evidence of anemia or thyroid dysfunction. She has yet to begin using a CPAP machine but hopes to receive her medical  equipment soon.  - Continue Eliquis - Continue amiodarone at 200 mg daily  - Reduce metoprolol succinate to 25 mg BID (split dosing per pt preference) due to ongoing feelings of fatigue in the setting of bradycardia   2. Heart failure with reduced ejection fraction: Recent ECHO from 06/2018 with EF of 25% due to globally reduced LV function. NHYA Class III. Patient recently restarted on her ARB. She also remains on metoprolol for management of heart failure. She is not a goof candidate for Entresto due to her renal function - GFR of 15 on last CMP. She reports no lower extremity swelling or other symptoms of CHF exacerbation. She remains on torsemide for fluid management.   3. Hx of severe AS s/p TAVR in 08/2017: ECHO from 06/2018 with fully mobile prosthetic aortic valve. Peak velocity of 2.1 m/s. No significant changes from prior suggesting normal functioning prosthetic valve. Continue to monitor and repeat imaging as needed.   4. HTN: Currently on amlodipine 5 mg daily and telmisartan 20 mg daily for blood pressure control. Blood pressure is well controlled today. Continue current regimen.   5. HLD: Currently on high intensity statin therapy with atorvastatin 40 mg daily. Tolerating statin therapy well.  Continue current regimen.   Orders Placed This Encounter  Procedures  . ECG 12-lead   Return in about 3 months (around 12/11/2018).  MICHELLE Jimmey Ralph, PA-C   Reproductive/Obstetrics                            Anesthesia Physical Anesthesia Plan  ASA: III  Anesthesia Plan: MAC   Post-op Pain Management:    Induction:   PONV Risk Score and Plan: 2 and Midazolam, Treatment may vary due to age or medical condition and TIVA  Airway Management Planned:   Additional Equipment:   Intra-op Plan:   Post-operative Plan:   Informed Consent: I have reviewed the patients History and Physical, chart, labs and discussed the procedure including the  risks, benefits and alternatives for the proposed anesthesia with the patient or authorized representative who has indicated his/her understanding and acceptance.       Plan Discussed with: CRNA  Anesthesia Plan Comments:         Anesthesia Quick Evaluation  Anesthesia Physical  Anesthesia Plan  ASA: III  Anesthesia Plan: MAC   Post-op Pain Management:    Induction:   PONV Risk Score and Plan: 2 and Midazolam, Treatment may vary due to age or medical condition and TIVA  Airway Management Planned:   Additional Equipment:   Intra-op Plan:   Post-operative Plan:   Informed Consent:   Plan Discussed with:   Anesthesia Plan Comments:         Anesthesia Quick Evaluation

## 2018-12-03 ENCOUNTER — Encounter (INDEPENDENT_AMBULATORY_CARE_PROVIDER_SITE_OTHER): Payer: Self-pay | Admitting: Vascular Surgery

## 2018-12-03 ENCOUNTER — Other Ambulatory Visit: Payer: Self-pay

## 2018-12-03 ENCOUNTER — Other Ambulatory Visit
Admission: RE | Admit: 2018-12-03 | Discharge: 2018-12-03 | Disposition: A | Discharge status: Home / Self Care | Payer: Medicare Other | Source: Ambulatory Visit | Attending: Ophthalmology | Admitting: Ophthalmology

## 2018-12-03 DIAGNOSIS — Z20828 Contact with and (suspected) exposure to other viral communicable diseases: Secondary | ICD-10-CM | POA: Diagnosis not present

## 2018-12-03 DIAGNOSIS — Z01812 Encounter for preprocedural laboratory examination: Secondary | ICD-10-CM | POA: Diagnosis present

## 2018-12-03 NOTE — Discharge Instructions (Signed)

## 2018-12-04 LAB — SARS CORONAVIRUS 2 (TAT 6-24 HRS): SARS Coronavirus 2: NEGATIVE

## 2018-12-07 ENCOUNTER — Ambulatory Visit: Payer: Medicare Other | Admitting: Anesthesiology

## 2018-12-07 ENCOUNTER — Encounter
Admission: RE | Disposition: A | Discharge status: Home / Self Care | Payer: Self-pay | Source: Home / Self Care | Attending: Ophthalmology

## 2018-12-07 ENCOUNTER — Ambulatory Visit
Admission: RE | Admit: 2018-12-07 | Discharge: 2018-12-07 | Disposition: A | Discharge status: Home / Self Care | Payer: Medicare Other | Attending: Ophthalmology | Admitting: Ophthalmology

## 2018-12-07 ENCOUNTER — Other Ambulatory Visit: Payer: Self-pay

## 2018-12-07 DIAGNOSIS — Z992 Dependence on renal dialysis: Secondary | ICD-10-CM | POA: Insufficient documentation

## 2018-12-07 DIAGNOSIS — I5020 Unspecified systolic (congestive) heart failure: Secondary | ICD-10-CM | POA: Diagnosis not present

## 2018-12-07 DIAGNOSIS — Z794 Long term (current) use of insulin: Secondary | ICD-10-CM | POA: Diagnosis not present

## 2018-12-07 DIAGNOSIS — N186 End stage renal disease: Secondary | ICD-10-CM | POA: Insufficient documentation

## 2018-12-07 DIAGNOSIS — F329 Major depressive disorder, single episode, unspecified: Secondary | ICD-10-CM | POA: Insufficient documentation

## 2018-12-07 DIAGNOSIS — E1142 Type 2 diabetes mellitus with diabetic polyneuropathy: Secondary | ICD-10-CM | POA: Diagnosis not present

## 2018-12-07 DIAGNOSIS — E785 Hyperlipidemia, unspecified: Secondary | ICD-10-CM | POA: Insufficient documentation

## 2018-12-07 DIAGNOSIS — I480 Paroxysmal atrial fibrillation: Secondary | ICD-10-CM | POA: Diagnosis not present

## 2018-12-07 DIAGNOSIS — Z885 Allergy status to narcotic agent status: Secondary | ICD-10-CM | POA: Insufficient documentation

## 2018-12-07 DIAGNOSIS — Z6841 Body Mass Index (BMI) 40.0 and over, adult: Secondary | ICD-10-CM | POA: Insufficient documentation

## 2018-12-07 DIAGNOSIS — Z79890 Hormone replacement therapy (postmenopausal): Secondary | ICD-10-CM | POA: Insufficient documentation

## 2018-12-07 DIAGNOSIS — Z7901 Long term (current) use of anticoagulants: Secondary | ICD-10-CM | POA: Insufficient documentation

## 2018-12-07 DIAGNOSIS — E1122 Type 2 diabetes mellitus with diabetic chronic kidney disease: Secondary | ICD-10-CM | POA: Insufficient documentation

## 2018-12-07 DIAGNOSIS — H2512 Age-related nuclear cataract, left eye: Secondary | ICD-10-CM | POA: Diagnosis present

## 2018-12-07 DIAGNOSIS — I132 Hypertensive heart and chronic kidney disease with heart failure and with stage 5 chronic kidney disease, or end stage renal disease: Secondary | ICD-10-CM | POA: Insufficient documentation

## 2018-12-07 DIAGNOSIS — E1136 Type 2 diabetes mellitus with diabetic cataract: Secondary | ICD-10-CM | POA: Insufficient documentation

## 2018-12-07 DIAGNOSIS — Z888 Allergy status to other drugs, medicaments and biological substances status: Secondary | ICD-10-CM | POA: Insufficient documentation

## 2018-12-07 DIAGNOSIS — Z87891 Personal history of nicotine dependence: Secondary | ICD-10-CM | POA: Insufficient documentation

## 2018-12-07 DIAGNOSIS — E039 Hypothyroidism, unspecified: Secondary | ICD-10-CM | POA: Diagnosis not present

## 2018-12-07 DIAGNOSIS — F419 Anxiety disorder, unspecified: Secondary | ICD-10-CM | POA: Diagnosis not present

## 2018-12-07 DIAGNOSIS — G4730 Sleep apnea, unspecified: Secondary | ICD-10-CM | POA: Diagnosis not present

## 2018-12-07 DIAGNOSIS — Z952 Presence of prosthetic heart valve: Secondary | ICD-10-CM | POA: Insufficient documentation

## 2018-12-07 HISTORY — PX: CATARACT EXTRACTION W/PHACO: SHX586

## 2018-12-07 LAB — GLUCOSE, CAPILLARY: Glucose-Capillary: 85 mg/dL (ref 70–99)

## 2018-12-07 SURGERY — PHACOEMULSIFICATION, CATARACT, WITH IOL INSERTION
Anesthesia: Monitor Anesthesia Care | Site: Eye | Laterality: Left

## 2018-12-07 MED ORDER — LIDOCAINE HCL (PF) 2 % IJ SOLN
INTRAOCULAR | Status: DC | PRN
  Administered 2018-12-07: 1 mL via INTRAOCULAR

## 2018-12-07 MED ORDER — SODIUM HYALURONATE 23 MG/ML IO SOLN
INTRAOCULAR | Status: DC | PRN
  Administered 2018-12-07: 0.6 mL via INTRAOCULAR

## 2018-12-07 MED ORDER — SODIUM HYALURONATE 10 MG/ML IO SOLN
INTRAOCULAR | Status: DC | PRN
  Administered 2018-12-07: 0.55 mL via INTRAOCULAR

## 2018-12-07 MED ORDER — ACETAMINOPHEN 160 MG/5ML PO SOLN: 325.0000 mg | Freq: Once | ORAL | Status: DC

## 2018-12-07 MED ORDER — MIDAZOLAM HCL 2 MG/2ML IJ SOLN
INTRAMUSCULAR | Status: DC | PRN
  Administered 2018-12-07: 1 mg via INTRAVENOUS

## 2018-12-07 MED ORDER — LACTATED RINGERS IV SOLN: INTRAVENOUS | Status: DC

## 2018-12-07 MED ORDER — TETRACAINE HCL 0.5 % OP SOLN
1.0000 [drp] | OPHTHALMIC | Status: DC | PRN
  Administered 2018-12-07 (×3): 1 [drp] via OPHTHALMIC

## 2018-12-07 MED ORDER — ARMC OPHTHALMIC DILATING DROPS
1.0000 "application " | OPHTHALMIC | Status: DC | PRN
  Administered 2018-12-07 (×3): 1 via OPHTHALMIC

## 2018-12-07 MED ORDER — MOXIFLOXACIN HCL 0.5 % OP SOLN
OPHTHALMIC | Status: DC | PRN
  Administered 2018-12-07: 0.2 mL via OPHTHALMIC

## 2018-12-07 MED ORDER — EPINEPHRINE PF 1 MG/ML IJ SOLN
INTRAOCULAR | Status: DC | PRN
  Administered 2018-12-07: 89 mL via OPHTHALMIC

## 2018-12-07 MED ORDER — FENTANYL CITRATE (PF) 100 MCG/2ML IJ SOLN
INTRAMUSCULAR | Status: DC | PRN
  Administered 2018-12-07: 50 ug via INTRAVENOUS

## 2018-12-07 MED ORDER — ACETAMINOPHEN 325 MG PO TABS: 325.0000 mg | ORAL_TABLET | Freq: Once | ORAL | Status: DC

## 2018-12-07 SURGICAL SUPPLY — 19 items
CANNULA ANT/CHMB 27G (MISCELLANEOUS) ×2 IMPLANT
CANNULA ANT/CHMB 27GA (MISCELLANEOUS) ×6 IMPLANT
DISSECTOR HYDRO NUCLEUS 50X22 (MISCELLANEOUS) ×3 IMPLANT
GLOVE SURG LX 7.5 STRW (GLOVE) ×2
GLOVE SURG LX STRL 7.5 STRW (GLOVE) ×1 IMPLANT
GLOVE SURG SYN 8.5  E (GLOVE) ×2
GLOVE SURG SYN 8.5 E (GLOVE) ×1 IMPLANT
GLOVE SURG SYN 8.5 PF PI (GLOVE) ×1 IMPLANT
GOWN STRL REUS W/ TWL LRG LVL3 (GOWN DISPOSABLE) ×2 IMPLANT
GOWN STRL REUS W/TWL LRG LVL3 (GOWN DISPOSABLE) ×4
LENS IOL TECNIS ITEC 23.5 (Intraocular Lens) ×2 IMPLANT
MARKER SKIN DUAL TIP RULER LAB (MISCELLANEOUS) ×3 IMPLANT
PACK DR. KING ARMS (PACKS) ×3 IMPLANT
PACK EYE AFTER SURG (MISCELLANEOUS) ×3 IMPLANT
PACK OPTHALMIC (MISCELLANEOUS) ×3 IMPLANT
SYR 3ML LL SCALE MARK (SYRINGE) ×3 IMPLANT
SYR TB 1ML LUER SLIP (SYRINGE) ×3 IMPLANT
WATER STERILE IRR 250ML POUR (IV SOLUTION) ×3 IMPLANT
WIPE NON LINTING 3.25X3.25 (MISCELLANEOUS) ×3 IMPLANT

## 2018-12-07 NOTE — Transfer of Care (Signed)
Immediate Anesthesia Transfer of Care Note  Patient: [name]  Procedure(s) Performed: CATARACT EXTRACTION PHACO AND INTRAOCULAR LENS PLACEMENT (IOC) LEFT DIABETIC 01:02.6  8.8%  5.77 (Left Eye)  Patient Location: PACU  Anesthesia Type: MAC  Level of Consciousness: awake, alert  and patient cooperative  Airway and Oxygen Therapy: Patient Spontanous Breathing and Patient connected to supplemental oxygen  Post-op Assessment: Post-op Vital signs reviewed, Patient's Cardiovascular Status Stable, Respiratory Function Stable, Patent Airway and No signs of Nausea or vomiting  Post-op Vital Signs: Reviewed and stable  Complications: No apparent anesthesia complications

## 2018-12-07 NOTE — Op Note (Signed)
OPERATIVE NOTE  [name] [id number] 12/07/2018   PREOPERATIVE DIAGNOSIS:  Nuclear sclerotic cataract left eye.  H25.12   POSTOPERATIVE DIAGNOSIS:    Nuclear sclerotic cataract left eye.     PROCEDURE:  Phacoemusification with posterior chamber intraocular lens placement of the left eye   LENS:   Implant Name Type Inv. Item Serial No. Manufacturer Lot No. LRB No. Used Action  LENS IOL DIOP 23.5 - [id number] Intraocular Lens LENS IOL DIOP 23.5 [id number] AMO  Left 1 Implanted      Procedure(s) with comments: CATARACT EXTRACTION PHACO AND INTRAOCULAR LENS PLACEMENT (IOC) LEFT DIABETIC 01:02.6  8.8%  5.77 (Left) - Diabetic - insulin  PCB00 +23.5   ULTRASOUND TIME: 1 minutes 02 seconds.  CDE 5.77   SURGEON:  Benay Pillow, MD, MPH   ANESTHESIA:  Topical with tetracaine drops augmented with 1% preservative-free intracameral lidocaine.  ESTIMATED BLOOD LOSS: <1 mL   COMPLICATIONS:  None.   DESCRIPTION OF PROCEDURE:  The patient was identified in the holding room and transported to the operating room and placed in the supine position under the operating microscope.  The left eye was identified as the operative eye and it was prepped and draped in the usual sterile ophthalmic fashion.   A 1.0 millimeter clear-corneal paracentesis was made at the 5:00 position. 0.5 ml of preservative-free 1% lidocaine with epinephrine was injected into the anterior chamber.  The anterior chamber was filled with Healon 5 viscoelastic.  A 2.4 millimeter keratome was used to make a near-clear corneal incision at the 2:00 position.  A curvilinear capsulorrhexis was made with a cystotome and capsulorrhexis forceps.  Balanced salt solution was used to hydrodissect and hydrodelineate the nucleus.   Phacoemulsification was then used in stop and chop fashion to remove the lens nucleus and epinucleus.  The remaining cortex was then removed using the irrigation and aspiration handpiece. Healon was then placed  into the capsular bag to distend it for lens placement.  A lens was then injected into the capsular bag.  The remaining viscoelastic was aspirated.   Wounds were hydrated with balanced salt solution.  The anterior chamber was inflated to a physiologic pressure with balanced salt solution.  Intracameral vigamox 0.1 mL undiltued was injected into the eye and a drop placed onto the ocular surface.  No wound leaks were noted.  The patient was taken to the recovery room in stable condition without complications of anesthesia or surgery  Benay Pillow 12/07/2018, 10:23 AM

## 2018-12-07 NOTE — Anesthesia Procedure Notes (Addendum)
Procedure Name: MAC Date/Time: 12/07/2018 9:57 AM Performed by: Cameron Ali, CRNA Pre-anesthesia Checklist: Patient identified, Emergency Drugs available, Suction available, Timeout performed and Patient being monitored Patient Re-evaluated:Patient Re-evaluated prior to induction Oxygen Delivery Method: Nasal cannula Placement Confirmation: positive ETCO2

## 2018-12-07 NOTE — Anesthesia Postprocedure Evaluation (Signed)
Anesthesia Post Note  Patient: [name]  Procedure(s) Performed: CATARACT EXTRACTION PHACO AND INTRAOCULAR LENS PLACEMENT (IOC) LEFT DIABETIC 01:02.6  8.8%  5.77 (Left Eye)  Patient location during evaluation: PACU Anesthesia Type: MAC Level of consciousness: awake and alert and oriented Pain management: satisfactory to patient Vital Signs Assessment: post-procedure vital signs reviewed and stable Respiratory status: spontaneous breathing, nonlabored ventilation and respiratory function stable Cardiovascular status: blood pressure returned to baseline and stable Postop Assessment: Adequate PO intake and No signs of nausea or vomiting Anesthetic complications: no    Raliegh Ip

## 2018-12-07 NOTE — H&P (Signed)

## 2018-12-08 ENCOUNTER — Encounter: Payer: Self-pay | Admitting: Ophthalmology

## 2018-12-08 LAB — GLUCOSE, CAPILLARY
Glucose-Capillary: 61 mg/dL — ABNORMAL LOW (ref 70–99)
Glucose-Capillary: 86 mg/dL (ref 70–99)

## 2018-12-19 ENCOUNTER — Ambulatory Visit: Admit: 2018-12-19 | Payer: Medicare Other | Admitting: Ophthalmology

## 2018-12-19 SURGERY — PHACOEMULSIFICATION, CATARACT, WITH IOL INSERTION
Anesthesia: Topical | Laterality: Right

## 2018-12-29 ENCOUNTER — Telehealth: Payer: Self-pay

## 2018-12-29 NOTE — Telephone Encounter (Signed)
LM for patinet to call us back for pre virtual visit questions.

## 2018-12-29 NOTE — Progress Notes (Signed)
Unsuccessful attempt to contact patient for Virtual Visit (Pain Management Telehealth)   Patient provided contact information:  [phone] (home); [phone] (mobile); (Preferred) [phone] No e-mail address on record   Pre-screening:  Our staff was successful in contacting [name] using the above provided information.   I unsuccessfully attempted to make contact with [name] on 12/30/2018 via telephone. I was unable to complete the virtual encounter due to call going directly to voicemail. I was able to leave a message where I clearly identify myself as Gaspar Cola, MD and I left a message to call us back to reschedule the call.  Pharmacotherapy Assessment  Analgesic: Oxycodone IR 5mg , 1 tab PO q 8hrs (15mg /day of oxycodone) MME/day:22.5mg /day.   Follow-up plan:   Reschedule Visit.     Interventional therapies: Planned, scheduled, and/or pending:   None at this point, her stomach wound had not healed completely.   Considering:   Diagnostic bilateral lumbar facet block  Possible bilateral lumbar facet RFA.  Diagnostic bilateral celiac plexus block  Diagnostic bilateral IA hip injection  Diagnostic bilateral femoral nerve and obturator NB  Possible bilateral femoral nerve and obturator nerve RFA.  Diagnostic bilateral IA knee joint injection  Possible series of 5 bilateral IA Hyalgan knee injections.  Diagnostic bilateral genicular NB  Possible bilateral genicular nerve RFA    Palliative PRN treatment(s):   Diagnostic bilateral lumbar facet block  Diagnostic bilateral celiac plexus block  Diagnostic bilateral IA hip injection  Diagnostic bilateral femoral nerve and obturator NB  Diagnostic bilateral IA knee joint injection Diagnostic bilateral genicular nerve block      Recent Visits No visits were found meeting these conditions.  Showing recent visits within past 90 days and meeting all other requirements   Today's Visits Date Type Provider  Dept  01/04/19 Telemedicine Milinda Pointer, MD [facility]  Showing today's visits and meeting all other requirements   Future Appointments No visits were found meeting these conditions.  Showing future appointments within next 90 days and meeting all other requirements    Note by: Gaspar Cola, MD Date: 12/30/2018; Time: 4:13 PM

## 2018-12-30 ENCOUNTER — Ambulatory Visit (HOSPITAL_BASED_OUTPATIENT_CLINIC_OR_DEPARTMENT_OTHER): Payer: Medicare Other | Admitting: Pain Medicine

## 2018-12-30 ENCOUNTER — Encounter: Payer: Self-pay | Admitting: Pain Medicine

## 2018-12-30 ENCOUNTER — Other Ambulatory Visit: Payer: Self-pay

## 2018-12-30 DIAGNOSIS — R109 Unspecified abdominal pain: Secondary | ICD-10-CM

## 2018-12-30 DIAGNOSIS — M5442 Lumbago with sciatica, left side: Secondary | ICD-10-CM

## 2018-12-30 DIAGNOSIS — M79605 Pain in left leg: Secondary | ICD-10-CM

## 2018-12-30 DIAGNOSIS — G894 Chronic pain syndrome: Secondary | ICD-10-CM

## 2018-12-30 DIAGNOSIS — G8929 Other chronic pain: Secondary | ICD-10-CM

## 2018-12-30 DIAGNOSIS — M79604 Pain in right leg: Secondary | ICD-10-CM

## 2018-12-30 DIAGNOSIS — M5441 Lumbago with sciatica, right side: Secondary | ICD-10-CM

## 2018-12-31 ENCOUNTER — Encounter: Payer: Self-pay | Admitting: Pain Medicine

## 2019-01-03 NOTE — Progress Notes (Signed)
Pain Management Virtual Encounter Note - Virtual Visit via Telephone Telehealth (real-time audio visits between healthcare provider and patient).   Patient's Phone No. & Preferred Pharmacy:  [phone] (home); [phone] (mobile); (Preferred) [phone] No e-mail address on record  [id number] - [place], [place] [address] [place] [address] Phone: [phone] Fax: [phone]    Pre-screening note:  Our staff contacted [name] and offered her an "in person", "face-to-face" appointment versus a telephone encounter. She indicated preferring the telephone encounter, at this time.   Reason for Virtual Visit: COVID-19*  Social distancing based on CDC and AMA recommendations.   I contacted [name] on 01/04/2019 via telephone.      I clearly identified myself as Gaspar Cola, MD. I verified that I was speaking with the correct person using two identifiers (Name: [name], and date of birth: [birth year]).  Advanced Informed Consent I sought verbal advanced consent from [name] for virtual visit interactions. I informed [name] of possible security and privacy concerns, risks, and limitations associated with providing "not-in-person" medical evaluation and management services. I also informed [name] of the availability of "in-person" appointments. Finally, I informed her that there would be a charge for the virtual visit and that she could be  personally, fully or partially, financially responsible for it. [name] expressed understanding and agreed to proceed.   Historic Elements   [name] is a 68 y.o. year old, female patient evaluated today after her last encounter by our practice on 12/29/2018. [name]  has a past medical history of Anemia, Anxiety, Aortic valve stenosis, Arrhythmia, Arthritis, B12 deficiency, Bowel obstruction (HCC), CHF (congestive heart failure) ([place]), CKD (chronic kidney disease), Colostomy in place  [facility]), Diabetes mellitus without complication ([place]), Dialysis patient [facility]), Diverticulitis large intestine, Dysrhythmia, Ectopic atrial tachycardia ([place]), FSGS (focal segmental glomerulosclerosis), Gastritis, GI bleed, Heart murmur, Hyperlipidemia, Hypertension, Hypothyroidism, Hypothyroidism, MRSA (methicillin resistant Staphylococcus aureus), Neuropathy, Neuropathy in diabetes ([place]), Obesity, Pedal edema, Psoriasis, Shortness of breath dyspnea, Sleep apnea, and Vertigo. She also  has a past surgical history that includes Colectomy; laparotomy closure of cecal perforation (05/09/2013); Tonsillectomy; Back surgery; Cardiac catheterization; Abdominal hysterectomy; Debridement of abdominal wall abscess (N/A, 02/22/2015); Excision mass abdominal (N/A, 02/24/2015); Application if wound vac (N/A, 02/24/2015); Excision mass abdominal (N/A, 02/26/2015); Application if wound vac (N/A, 02/26/2015); Wound debridement (N/A, 03/03/2015); Wound debridement (N/A, 03/09/2015); Flexible sigmoidoscopy (06/19/2015); Colostomy reversal (N/A, 07/12/2015); laparotomy (07/12/2015); laparoscopy (07/12/2015); Lysis of adhesion (07/12/2015); laparotomy (N/A, 02/06/2015); Colonoscopy with propofol (N/A, 09/04/2016); Cardiac valve replacement (08/2017); Cardioversion (N/A, 07/15/2018); DIALYSIS/PERMA CATHETER INSERTION (N/A, 10/20/2018); Cataract extraction w/PHACO (Right, 11/16/2018); and Cataract extraction w/PHACO (Left, 12/07/2018). Ms. Olberding has a current medication list which includes the following prescription(s): amiodarone, amlodipine, apixaban, atorvastatin, betamethasone valerate, calcitriol, clobetasol cream, duloxetine, dupixent, glucose blood, humalog kwikpen, hydrocortisone, lantus solostar, levothyroxine, metoprolol succinate, onetouch delica lancets 77A, oxycodone, oxycodone, oxycodone, telmisartan, torsemide, and vitamin d (ergocalciferol). She  reports that she quit smoking about 5 years ago. Her smoking use included cigarettes. She has never  used smokeless tobacco. She reports that she does not drink alcohol or use drugs. [name] is allergic to buspirone; citalopram; lisinopril; metformin; pravastatin; sitagliptin; tramadol; diltiazem hcl; gabapentin; hydralazine; and lovastatin.   HPI  Today, she is being contacted for medication management.  The patient indicates doing well with the current medication regimen. No adverse reactions or side effects reported to the medications.   Pharmacotherapy Assessment  Analgesic: Oxycodone IR 5mg , 1 tab PO q 8hrs (15mg /day of oxycodone) MME/day:22.5mg /day.   Monitoring: Pharmacotherapy: No side-effects or adverse reactions reported. [place] PMP: PDMP reviewed during this encounter.       Compliance: No problems identified. Effectiveness: Clinically acceptable. Plan: Refer to "POC".  UDS:  Summary  Date Value Ref Range Status  04/09/2018 FINAL  Final    Comment:    ==================================================================== TOXASSURE SELECT 13 (MW) ==================================================================== Test                             Result       Flag       Units Drug Present not Declared for Prescription Verification   Oxycodone                      386          UNEXPECTED ng/mg creat   Oxymorphone                    614          UNEXPECTED ng/mg creat   Noroxycodone                   349          UNEXPECTED ng/mg creat   Noroxymorphone                 141          UNEXPECTED ng/mg creat    Sources of oxycodone are scheduled prescription medications.    Oxymorphone, noroxycodone, and noroxymorphone are expected    metabolites of oxycodone. Oxymorphone is also available as a    scheduled prescription medication. ==================================================================== Test                      Result    Flag   Units      Ref Range   Creatinine              76               mg/dL       >=20 ==================================================================== Declared Medications:  The flagging and interpretation on this report are based on the  following declared medications.  Unexpected results may arise from  inaccuracies in the declared medications.  **Note: The testing scope of this panel does not include following  reported medications:  Amlodipine Besylate  Apixaban  Atorvastatin  Clobetasol  Duloxetine  Insulin (Lantus)  Levothyroxine ==================================================================== For clinical consultation, please call [phone]. ====================================================================    Laboratory Chemistry Profile (12 mo)  Renal: 07/15/2018: BUN 55; Creatinine, Ser 3.39  Lab Results  Component Value Date   GFRAA 15 (L) 07/15/2018   GFRNONAA 13 (L) 07/15/2018   Hepatic: No results found for requested labs within last 8760 hours. Lab Results  Component Value Date   AST 19 07/24/2017   ALT 14 07/24/2017   Other: No results found for requested labs within last 8760 hours. Note: Above Lab results reviewed.  Imaging  Last 90 days:  Vas [place] Upper Extremity Arterial Duplex  Result Date: 11/30/2018 UPPER EXTREMITY DUPLEX STUDY Limitations: Pre Assess HDA Performing Technologist: Almira Coaster RVS  Examination Guidelines: A complete evaluation includes B-mode imaging, spectral Doppler, color Doppler, and power Doppler as needed of all accessible portions of each vessel. Bilateral testing is considered an integral part of a complete examination. Limited examinations for  reoccurring indications may be performed as noted.  Right Pre-Dialysis Findings: +-----------------------+----------+--------------------+---------+--------+ Location               PSV (cm/s)Intralum. Diam. (cm)Waveform Comments +-----------------------+----------+--------------------+---------+--------+ Brachial Antecub. fossa53        0.49                 triphasic         +-----------------------+----------+--------------------+---------+--------+ Radial Art at Wrist    43        0.23                triphasic         +-----------------------+----------+--------------------+---------+--------+ Ulnar Art at Wrist     37        0.30                triphasic         +-----------------------+----------+--------------------+---------+--------+ Left Pre-Dialysis Findings: +-----------------------+----------+--------------------+---------+--------+ Location               PSV (cm/s)Intralum. Diam. (cm)Waveform Comments +-----------------------+----------+--------------------+---------+--------+ Brachial Antecub. fossa40        0.42                triphasic         +-----------------------+----------+--------------------+---------+--------+ Radial Art at Wrist    38        0.23                triphasic         +-----------------------+----------+--------------------+---------+--------+ Ulnar Art at Wrist     26        0.34                triphasic         +-----------------------+----------+--------------------+---------+--------+  Summary:  Right: Perforator, Brachial and Ulnar veins measured >.2. Left: Perforator, Brachial and Ulnar veins measured >.2. *See table(s) above for measurements and observations. Electronically signed by Hortencia Pilar MD on 11/30/2018 at 5:21:40 PM.    Final    Vas [place] Upper Ext Vein Mapping (pre-op Avf)  Result Date: 11/30/2018 UPPER EXTREMITY VEIN MAPPING  Indications: Pre-access. Performing Technologist: Almira Coaster RVS  Examination Guidelines: A complete evaluation includes B-mode imaging, spectral Doppler, color Doppler, and power Doppler as needed of all accessible portions of each vessel. Bilateral testing is considered an integral part of a complete examination. Limited examinations for reoccurring indications may be performed as noted.  +-----------------+-------------+----------+--------+ Right Cephalic   Diameter (cm)Depth (cm)Findings +-----------------+-------------+----------+--------+ Prox upper arm       0.40                        +-----------------+-------------+----------+--------+ Mid upper arm        0.42                        +-----------------+-------------+----------+--------+ Dist upper arm       0.24                        +-----------------+-------------+----------+--------+ Antecubital fossa    0.23                        +-----------------+-------------+----------+--------+ Prox forearm         0.27                        +-----------------+-------------+----------+--------+ Mid forearm  0.26                        +-----------------+-------------+----------+--------+ Dist forearm         0.17                        +-----------------+-------------+----------+--------+ +-----------------+-------------+----------+--------+ Right Basilic    Diameter (cm)Depth (cm)Findings +-----------------+-------------+----------+--------+ Mid upper arm        0.47                        +-----------------+-------------+----------+--------+ Dist upper arm       0.46                        +-----------------+-------------+----------+--------+ Antecubital fossa    0.35                        +-----------------+-------------+----------+--------+ Prox forearm         0.40                        +-----------------+-------------+----------+--------+ +-----------------+-------------+----------+--------+ Left Cephalic    Diameter (cm)Depth (cm)Findings +-----------------+-------------+----------+--------+ Shoulder             0.48                        +-----------------+-------------+----------+--------+ Prox upper arm       0.55                        +-----------------+-------------+----------+--------+ Mid upper arm        0.55                         +-----------------+-------------+----------+--------+ Dist upper arm       0.45                        +-----------------+-------------+----------+--------+ Antecubital fossa    0.39                        +-----------------+-------------+----------+--------+ Prox forearm         0.55                        +-----------------+-------------+----------+--------+ Mid forearm          0.31                        +-----------------+-------------+----------+--------+ Dist forearm         0.25                        +-----------------+-------------+----------+--------+ +-----------------+-------------+----------+--------+ Left Basilic     Diameter (cm)Depth (cm)Findings +-----------------+-------------+----------+--------+ Mid upper arm        0.46                        +-----------------+-------------+----------+--------+ Dist upper arm       0.44                        +-----------------+-------------+----------+--------+ Antecubital fossa    0.42                        +-----------------+-------------+----------+--------+  Prox forearm         0.41                        +-----------------+-------------+----------+--------+ Summary: Right: The Basilic and Cephalic Vein were both Mapped, Vessels        appear to display no evidence of Thrombophlebitis. Left: The Basilic and Cephalic Vein were both Mapped, Vessels appear       to display no evidence of Thrombophlebitis. *See table(s) above for measurements and observations.  Diagnosing physician: Hortencia Pilar MD Electronically signed by Hortencia Pilar MD on 11/30/2018 at 5:21:42 PM.    Final     Assessment  The primary encounter diagnosis was Chronic pain syndrome. Diagnoses of Chronic low back pain (Primary Area of Pain) (Bilateral) (L>R) and Chronic lower extremity pain (Secondary area of Pain) (Bilateral) (R>L) were also pertinent to this visit.  Plan of Care  I am having [name]. [name] start on oxyCODONE  and oxyCODONE. I am also having her maintain her amLODipine, OneTouch Delica Lancets 87F, glucose blood, atorvastatin, DULoxetine, levothyroxine, clobetasol cream, apixaban, calcitRIOL, metoprolol succinate, Vitamin D (Ergocalciferol), amiodarone, Dupixent, hydrocortisone, torsemide, HumaLOG KwikPen, Lantus SoloStar, telmisartan, betamethasone valerate, and oxyCODONE.  Pharmacotherapy (Medications Ordered): Meds ordered this encounter  Medications  . oxyCODONE (OXY IR/ROXICODONE) 5 MG immediate release tablet    Sig: Take 1 tablet (5 mg total) by mouth every 8 (eight) hours as needed for severe pain. Must last 30 days    Dispense:  90 tablet    Refill:  0    Chronic Pain: STOP Act (Not applicable) Fill 1 day early if closed on refill date. Do not fill until: 01/04/2019. To last until: 02/03/2019. Avoid benzodiazepines within 8 hours of opioids  . oxyCODONE (OXY IR/ROXICODONE) 5 MG immediate release tablet    Sig: Take 1 tablet (5 mg total) by mouth every 8 (eight) hours as needed for severe pain. Must last 30 days    Dispense:  90 tablet    Refill:  0    Chronic Pain: STOP Act (Not applicable) Fill 1 day early if closed on refill date. Do not fill until: 02/03/2019. To last until: 03/05/2019. Avoid benzodiazepines within 8 hours of opioids  . oxyCODONE (OXY IR/ROXICODONE) 5 MG immediate release tablet    Sig: Take 1 tablet (5 mg total) by mouth every 8 (eight) hours as needed for severe pain. Must last 30 days    Dispense:  90 tablet    Refill:  0    Chronic Pain: STOP Act (Not applicable) Fill 1 day early if closed on refill date. Do not fill until: 03/05/2019. To last until: 04/04/2019. Avoid benzodiazepines within 8 hours of opioids   Orders:  No orders of the defined types were placed in this encounter.  Follow-up plan:   Return in about 3 months (around 03/31/2019) for (VV), (MM), in addition, PRN Procedure(s): (B) L-FCT BLK.      Interventional treatment options: Under consideration:    NOTE: Currently on dialysis. Diagnostic bilateral lumbar facet block  Possible bilateral lumbar facet RFA.  Diagnostic bilateral celiac plexus block  Diagnostic bilateral IA hip injection  Diagnostic bilateral femoral nerve and obturator NB  Possible bilateral femoral nerve and obturator nerve RFA.  Diagnostic bilateral IA knee joint injection  Possible series of 5 bilateral IA Hyalgan knee injections.  Diagnostic bilateral genicular NB  Possible bilateral genicular nerve RFA    Palliative PRN treatment(s):   None at this time.  Recent Visits No visits were found meeting these conditions.  Showing recent visits within past 90 days and meeting all other requirements   Today's Visits Date Type Provider Dept  01/04/19 Telemedicine Milinda Pointer, MD [facility]  Showing today's visits and meeting all other requirements   Future Appointments No visits were found meeting these conditions.  Showing future appointments within next 90 days and meeting all other requirements   I discussed the assessment and treatment plan with the patient. The patient was provided an opportunity to ask questions and all were answered. The patient agreed with the plan and demonstrated an understanding of the instructions.  Patient advised to call back or seek an in-person evaluation if the symptoms or condition worsens.  Total duration of non-face-to-face encounter: 15 minutes.  Note by: Gaspar Cola, MD Date: 01/04/2019; Time: 4:27 PM  Note: This dictation was prepared with Dragon dictation. Any transcriptional errors that may result from this process are unintentional.  Disclaimer:  * Given the special circumstances of the COVID-19 pandemic, the federal government has announced that the Office for Civil Rights (OCR) will exercise its enforcement discretion and will not impose penalties on physicians using telehealth in the event of noncompliance with regulatory requirements  under the [place] (HIPAA) in connection with the good faith provision of telehealth during the WCHEN-27 national public health emergency. ([place])

## 2019-01-04 ENCOUNTER — Ambulatory Visit: Payer: Medicare Other | Attending: Pain Medicine | Admitting: Pain Medicine

## 2019-01-04 ENCOUNTER — Other Ambulatory Visit: Payer: Self-pay

## 2019-01-04 DIAGNOSIS — M5441 Lumbago with sciatica, right side: Secondary | ICD-10-CM

## 2019-01-04 DIAGNOSIS — M5442 Lumbago with sciatica, left side: Secondary | ICD-10-CM

## 2019-01-04 DIAGNOSIS — M79604 Pain in right leg: Secondary | ICD-10-CM

## 2019-01-04 DIAGNOSIS — G894 Chronic pain syndrome: Secondary | ICD-10-CM | POA: Diagnosis not present

## 2019-01-04 DIAGNOSIS — G8929 Other chronic pain: Secondary | ICD-10-CM

## 2019-01-04 DIAGNOSIS — M79605 Pain in left leg: Secondary | ICD-10-CM

## 2019-01-04 MED ORDER — OXYCODONE HCL 5 MG PO TABS: 5.0000 mg | ORAL_TABLET | Freq: Three times a day (TID) | ORAL | 0 refills | Status: DC | PRN

## 2019-01-04 NOTE — Patient Instructions (Signed)

## 2019-01-13 ENCOUNTER — Telehealth (INDEPENDENT_AMBULATORY_CARE_PROVIDER_SITE_OTHER): Payer: Self-pay

## 2019-01-13 NOTE — Telephone Encounter (Signed)
I attempted to contact the patient regarding her surgery. Patient is scheduled with Dr. Delana Meyer for 01/29/2019 surgery. Patient will do covid and pre-op on 01/26/2019 at 1:00 pm at the [facility]. Pre-surgical instructions and information will be mailed to the patient.

## 2019-01-25 ENCOUNTER — Other Ambulatory Visit (INDEPENDENT_AMBULATORY_CARE_PROVIDER_SITE_OTHER): Payer: Self-pay | Admitting: Nurse Practitioner

## 2019-01-26 ENCOUNTER — Encounter
Admission: RE | Admit: 2019-01-26 | Discharge: 2019-01-26 | Disposition: A | Discharge status: Home / Self Care | Payer: Medicare Other | Source: Ambulatory Visit | Attending: Vascular Surgery | Admitting: Vascular Surgery

## 2019-01-26 ENCOUNTER — Other Ambulatory Visit: Payer: Self-pay

## 2019-01-26 DIAGNOSIS — Z01812 Encounter for preprocedural laboratory examination: Secondary | ICD-10-CM | POA: Diagnosis not present

## 2019-01-26 DIAGNOSIS — N186 End stage renal disease: Secondary | ICD-10-CM | POA: Diagnosis not present

## 2019-01-26 DIAGNOSIS — Z20828 Contact with and (suspected) exposure to other viral communicable diseases: Secondary | ICD-10-CM | POA: Diagnosis not present

## 2019-01-26 LAB — CBC WITH DIFFERENTIAL/PLATELET
Abs Immature Granulocytes: 0.06 10*3/uL (ref 0.00–0.07)
Basophils Absolute: 0 10*3/uL (ref 0.0–0.1)
Basophils Relative: 0 %
Eosinophils Absolute: 0.2 10*3/uL (ref 0.0–0.5)
Eosinophils Relative: 4 %
HCT: 37.4 % (ref 36.0–46.0)
Hemoglobin: 11.9 g/dL — ABNORMAL LOW (ref 12.0–15.0)
Immature Granulocytes: 1 %
Lymphocytes Relative: 15 %
Lymphs Abs: 1 10*3/uL (ref 0.7–4.0)
MCH: 27.8 pg (ref 26.0–34.0)
MCHC: 31.8 g/dL (ref 30.0–36.0)
MCV: 87.4 fL (ref 80.0–100.0)
Monocytes Absolute: 0.6 10*3/uL (ref 0.1–1.0)
Monocytes Relative: 9 %
Neutro Abs: 4.9 10*3/uL (ref 1.7–7.7)
Neutrophils Relative %: 71 %
Platelets: 173 10*3/uL (ref 150–400)
RBC: 4.28 MIL/uL (ref 3.87–5.11)
RDW: 15.3 % (ref 11.5–15.5)
WBC: 6.9 10*3/uL (ref 4.0–10.5)
nRBC: 0 % (ref 0.0–0.2)

## 2019-01-26 LAB — BASIC METABOLIC PANEL
Anion gap: 12 (ref 5–15)
BUN: 20 mg/dL (ref 8–23)
CO2: 27 mmol/L (ref 22–32)
Calcium: 8.5 mg/dL — ABNORMAL LOW (ref 8.9–10.3)
Chloride: 97 mmol/L — ABNORMAL LOW (ref 98–111)
Creatinine, Ser: 2.71 mg/dL — ABNORMAL HIGH (ref 0.44–1.00)
GFR calc Af Amer: 20 mL/min — ABNORMAL LOW (ref >60)
GFR calc non Af Amer: 17 mL/min — ABNORMAL LOW (ref >60)
Glucose, Bld: 328 mg/dL — ABNORMAL HIGH (ref 70–99)
Potassium: 4.3 mmol/L (ref 3.5–5.1)
Sodium: 136 mmol/L (ref 135–145)

## 2019-01-26 LAB — PROTIME-INR
INR: 1.3 — ABNORMAL HIGH (ref 0.8–1.2)
Prothrombin Time: 16 seconds — ABNORMAL HIGH (ref 11.4–15.2)

## 2019-01-26 LAB — APTT: aPTT: 34 seconds (ref 24–36)

## 2019-01-26 NOTE — Pre-Procedure Instructions (Signed)
Abnormal glucose result faxed to Dr Nino Parsley office

## 2019-01-26 NOTE — Patient Instructions (Signed)
Your procedure is scheduled on: January 29, 2019 Friday Report to Day Surgery on the 2nd floor of the [facility]. To find out your arrival time, please call [phone] between 1PM - 3PM on: January 28, 2019 Thursday   REMEMBER: Instructions that are not followed completely may result in serious medical risk, up to and including death; or upon the discretion of your surgeon and anesthesiologist your surgery may need to be rescheduled.  Do not eat food after midnight the night before surgery.  No gum chewing, lozengers or hard candies.  You may however, drink CLEAR liquids up to 2 hours before you are scheduled to arrive for your surgery. Do not drink anything within 2 hours of the start of your surgery.  Clear liquids include: - water   Do NOT drink anything that is not on this list.  Type 1 and Type 2 diabetics should only drink water.   No Alcohol for 24 hours before or after surgery.  No Smoking including e-cigarettes for 24 hours prior to surgery.  No chewable tobacco products for at least 6 hours prior to surgery.  No nicotine patches on the day of surgery.  On the morning of surgery brush your teeth with toothpaste and water, you may rinse your mouth with mouthwash if you wish. Do not swallow any toothpaste or mouthwash.  Notify your doctor if there is any change in your medical condition (cold, fever, infection).  Do not wear jewelry, make-up, hairpins, clips or nail polish.  Do not wear lotions, powders, or perfumes or deodorant   Do not shave 48 hours prior to surgery.   Contacts and dentures may not be worn into surgery.  Do not bring valuables to the hospital, including drivers license, insurance or credit cards.  [facility] is not responsible for any belongings or valuables.   TAKE THESE MEDICATIONS THE MORNING OF SURGERY: Amiodarone Amlodipine Atorvastatin Duloxetine Levothyroxine metoprolol  Do not take torsemide and telmisartan  Use CHG  or  wipes as directed on instruction sheet.  Take 1/2 of usual insulin dose the night before surgery and none on the morning of surgery.  Follow recommendations from Cardiologist, Pulmonologist or PCP regarding stopping Eliquis stop now  Stop Anti-inflammatories (NSAIDS) such as Advil, Aleve, Ibuprofen, Motrin, Naproxen, Naprosyn and Aspirin based products such as Excedrin, Goodys Powder, BC Powder. (May take Tylenol or Acetaminophen if needed.)  Stop ANY OVER THE COUNTER supplements until after surgery. (May continue Vitamin D, Vitamin B, and multivitamin.)  Wear comfortable clothing (specific to your surgery type) to the hospital.  Plan for stool softeners for home use.  If you are being discharged the day of surgery, you will not be allowed to drive home. You will need a responsible adult to drive you home and stay with you that night.   If you are taking public transportation, you will need to have a responsible adult with you. Please confirm with your physician that it is acceptable to use public transportation.   Please call [phone] if you have any questions about these instructions.

## 2019-01-27 LAB — SARS CORONAVIRUS 2 (TAT 6-24 HRS): SARS Coronavirus 2: NEGATIVE

## 2019-01-27 NOTE — Pre-Procedure Instructions (Signed)
Response from secure chat with Dr. Ola Spurr: she should take her usual dose of Lantus at bedtime, and her usual dose of Humalog with dinner as usual. We will check pre-op on day of surgery and if she still has BG>300 her case may need to be delayed.

## 2019-01-27 NOTE — Pre-Procedure Instructions (Signed)
Glucose of 328 faxed to Dr. Caryl Comes  and secure chat message sent to see if he wants to make any changes before surgery on 12/11.

## 2019-01-27 NOTE — Pre-Procedure Instructions (Signed)
Patient notified to take her usual dose of insulin the night before the surgery and none the morning of the surgery.  Verbalizes understanding.

## 2019-01-27 NOTE — Pre-Procedure Instructions (Signed)
Dr. Caryl Comes notified of anesthesia's recommendation to take full dose of insulin for supper and bedtime the night before the surgery.

## 2019-01-29 ENCOUNTER — Ambulatory Visit: Payer: Medicare Other

## 2019-01-29 ENCOUNTER — Ambulatory Visit: Payer: Medicare Other | Admitting: Anesthesiology

## 2019-01-29 ENCOUNTER — Ambulatory Visit
Admission: RE | Admit: 2019-01-29 | Discharge: 2019-01-29 | Disposition: A | Discharge status: Home / Self Care | Payer: Medicare Other | Attending: Vascular Surgery | Admitting: Vascular Surgery

## 2019-01-29 ENCOUNTER — Encounter
Admission: RE | Disposition: A | Discharge status: Home / Self Care | Payer: Self-pay | Source: Home / Self Care | Attending: Vascular Surgery

## 2019-01-29 ENCOUNTER — Other Ambulatory Visit: Payer: Self-pay

## 2019-01-29 ENCOUNTER — Encounter: Payer: Self-pay | Admitting: Vascular Surgery

## 2019-01-29 ENCOUNTER — Ambulatory Visit: Payer: Self-pay

## 2019-01-29 DIAGNOSIS — R0902 Hypoxemia: Secondary | ICD-10-CM

## 2019-01-29 DIAGNOSIS — Z992 Dependence on renal dialysis: Secondary | ICD-10-CM | POA: Diagnosis not present

## 2019-01-29 DIAGNOSIS — G4730 Sleep apnea, unspecified: Secondary | ICD-10-CM | POA: Diagnosis not present

## 2019-01-29 DIAGNOSIS — I132 Hypertensive heart and chronic kidney disease with heart failure and with stage 5 chronic kidney disease, or end stage renal disease: Secondary | ICD-10-CM | POA: Insufficient documentation

## 2019-01-29 DIAGNOSIS — I480 Paroxysmal atrial fibrillation: Secondary | ICD-10-CM | POA: Diagnosis not present

## 2019-01-29 DIAGNOSIS — L988 Other specified disorders of the skin and subcutaneous tissue: Secondary | ICD-10-CM

## 2019-01-29 DIAGNOSIS — E669 Obesity, unspecified: Secondary | ICD-10-CM | POA: Diagnosis not present

## 2019-01-29 DIAGNOSIS — Z87891 Personal history of nicotine dependence: Secondary | ICD-10-CM | POA: Diagnosis not present

## 2019-01-29 DIAGNOSIS — E039 Hypothyroidism, unspecified: Secondary | ICD-10-CM | POA: Insufficient documentation

## 2019-01-29 DIAGNOSIS — E785 Hyperlipidemia, unspecified: Secondary | ICD-10-CM | POA: Diagnosis not present

## 2019-01-29 DIAGNOSIS — N186 End stage renal disease: Secondary | ICD-10-CM | POA: Insufficient documentation

## 2019-01-29 DIAGNOSIS — E1122 Type 2 diabetes mellitus with diabetic chronic kidney disease: Secondary | ICD-10-CM | POA: Insufficient documentation

## 2019-01-29 DIAGNOSIS — N185 Chronic kidney disease, stage 5: Secondary | ICD-10-CM

## 2019-01-29 DIAGNOSIS — E1140 Type 2 diabetes mellitus with diabetic neuropathy, unspecified: Secondary | ICD-10-CM | POA: Diagnosis not present

## 2019-01-29 DIAGNOSIS — F419 Anxiety disorder, unspecified: Secondary | ICD-10-CM | POA: Diagnosis not present

## 2019-01-29 DIAGNOSIS — I509 Heart failure, unspecified: Secondary | ICD-10-CM | POA: Insufficient documentation

## 2019-01-29 HISTORY — PX: AV FISTULA PLACEMENT: SHX1204

## 2019-01-29 LAB — POCT I-STAT, CHEM 8
BUN: 32 mg/dL — ABNORMAL HIGH (ref 8–23)
Calcium, Ion: 1.14 mmol/L — ABNORMAL LOW (ref 1.15–1.40)
Chloride: 100 mmol/L (ref 98–111)
Creatinine, Ser: 3.5 mg/dL — ABNORMAL HIGH (ref 0.44–1.00)
Glucose, Bld: 184 mg/dL — ABNORMAL HIGH (ref 70–99)
HCT: 43 % (ref 36.0–46.0)
Hemoglobin: 14.6 g/dL (ref 12.0–15.0)
Potassium: 4.6 mmol/L (ref 3.5–5.1)
Sodium: 137 mmol/L (ref 135–145)
TCO2: 26 mmol/L (ref 22–32)

## 2019-01-29 LAB — PROTIME-INR
INR: 1 (ref 0.8–1.2)
Prothrombin Time: 13 seconds (ref 11.4–15.2)

## 2019-01-29 LAB — GLUCOSE, CAPILLARY: Glucose-Capillary: 186 mg/dL — ABNORMAL HIGH (ref 70–99)

## 2019-01-29 LAB — POTASSIUM: Potassium: 4.5 mmol/L (ref 3.5–5.1)

## 2019-01-29 SURGERY — ARTERIOVENOUS (AV) FISTULA CREATION
Anesthesia: General | Laterality: Left

## 2019-01-29 MED ORDER — PROPOFOL 500 MG/50ML IV EMUL
INTRAVENOUS | Status: AC
  Filled 2019-01-29: qty 50

## 2019-01-29 MED ORDER — MIDAZOLAM HCL 2 MG/2ML IJ SOLN: 1.0000 mg | Freq: Once | INTRAMUSCULAR | Status: AC

## 2019-01-29 MED ORDER — HYDROCODONE-ACETAMINOPHEN 5-325 MG PO TABS: 1.0000 | ORAL_TABLET | Freq: Four times a day (QID) | ORAL | 0 refills | Status: DC | PRN

## 2019-01-29 MED ORDER — FAMOTIDINE 20 MG PO TABS: 20.0000 mg | ORAL_TABLET | Freq: Once | ORAL | Status: AC

## 2019-01-29 MED ORDER — MIDAZOLAM HCL 2 MG/2ML IJ SOLN
INTRAMUSCULAR | Status: DC | PRN
  Administered 2019-01-29 (×2): 1 mg via INTRAVENOUS

## 2019-01-29 MED ORDER — CEFAZOLIN SODIUM-DEXTROSE 1-4 GM/50ML-% IV SOLN
1.0000 g | INTRAVENOUS | Status: AC
  Administered 2019-01-29: 1 g via INTRAVENOUS

## 2019-01-29 MED ORDER — LIDOCAINE HCL (PF) 1 % IJ SOLN
INTRAMUSCULAR | Status: AC
  Filled 2019-01-29: qty 5

## 2019-01-29 MED ORDER — FAMOTIDINE 20 MG PO TABS
ORAL_TABLET | ORAL | Status: AC
  Administered 2019-01-29: 20 mg via ORAL
  Filled 2019-01-29: qty 1

## 2019-01-29 MED ORDER — ROPIVACAINE HCL 5 MG/ML IJ SOLN
INTRAMUSCULAR | Status: AC
  Filled 2019-01-29: qty 30

## 2019-01-29 MED ORDER — AMINOCAPROIC ACID 250 MG/ML IV SOLN: INTRAVENOUS | Status: DC | PRN

## 2019-01-29 MED ORDER — ACETAMINOPHEN 160 MG/5ML PO SOLN
325.0000 mg | ORAL | Status: DC | PRN
  Filled 2019-01-29: qty 20.3

## 2019-01-29 MED ORDER — CHLORHEXIDINE GLUCONATE CLOTH 2 % EX PADS: 6.0000 | MEDICATED_PAD | Freq: Once | CUTANEOUS | Status: DC

## 2019-01-29 MED ORDER — FENTANYL CITRATE (PF) 100 MCG/2ML IJ SOLN
INTRAMUSCULAR | Status: AC
  Filled 2019-01-29: qty 2

## 2019-01-29 MED ORDER — FENTANYL CITRATE (PF) 100 MCG/2ML IJ SOLN
INTRAMUSCULAR | Status: AC
  Administered 2019-01-29: 50 ug via INTRAVENOUS
  Filled 2019-01-29: qty 2

## 2019-01-29 MED ORDER — FENTANYL CITRATE (PF) 100 MCG/2ML IJ SOLN: 25.0000 ug | INTRAMUSCULAR | Status: DC | PRN

## 2019-01-29 MED ORDER — PHENYLEPHRINE HCL (PRESSORS) 10 MG/ML IV SOLN
INTRAVENOUS | Status: DC | PRN
  Administered 2019-01-29: 100 ug via INTRAVENOUS

## 2019-01-29 MED ORDER — ACETAMINOPHEN 325 MG PO TABS: 325.0000 mg | ORAL_TABLET | ORAL | Status: DC | PRN

## 2019-01-29 MED ORDER — MIDAZOLAM HCL 2 MG/2ML IJ SOLN
INTRAMUSCULAR | Status: AC
  Administered 2019-01-29: 1 mg via INTRAVENOUS
  Filled 2019-01-29: qty 2

## 2019-01-29 MED ORDER — DEXAMETHASONE SODIUM PHOSPHATE 10 MG/ML IJ SOLN
INTRAMUSCULAR | Status: AC
  Filled 2019-01-29: qty 1

## 2019-01-29 MED ORDER — ONDANSETRON HCL 4 MG/2ML IJ SOLN
INTRAMUSCULAR | Status: DC | PRN
  Administered 2019-01-29: 4 mg via INTRAVENOUS

## 2019-01-29 MED ORDER — MIDAZOLAM HCL 2 MG/2ML IJ SOLN
INTRAMUSCULAR | Status: AC
  Filled 2019-01-29: qty 2

## 2019-01-29 MED ORDER — PROPOFOL 500 MG/50ML IV EMUL
INTRAVENOUS | Status: DC | PRN
  Administered 2019-01-29: 75 ug/kg/min via INTRAVENOUS

## 2019-01-29 MED ORDER — SODIUM CHLORIDE 0.9 % IV SOLN
INTRAVENOUS | Status: DC
  Administered 2019-01-29: 09:00:00 via INTRAVENOUS

## 2019-01-29 MED ORDER — ROPIVACAINE HCL 5 MG/ML IJ SOLN
INTRAMUSCULAR | Status: DC | PRN
  Administered 2019-01-29: 30 mL via EPIDURAL

## 2019-01-29 MED ORDER — EPINEPHRINE PF 1 MG/ML IJ SOLN
INTRAMUSCULAR | Status: AC
  Filled 2019-01-29: qty 1

## 2019-01-29 MED ORDER — FENTANYL CITRATE (PF) 100 MCG/2ML IJ SOLN: 50.0000 ug | Freq: Once | INTRAMUSCULAR | Status: AC

## 2019-01-29 MED ORDER — CEFAZOLIN SODIUM-DEXTROSE 1-4 GM/50ML-% IV SOLN
INTRAVENOUS | Status: AC
  Filled 2019-01-29: qty 50

## 2019-01-29 MED ORDER — PROMETHAZINE HCL 25 MG/ML IJ SOLN: 6.2500 mg | INTRAMUSCULAR | Status: DC | PRN

## 2019-01-29 MED ORDER — FENTANYL CITRATE (PF) 100 MCG/2ML IJ SOLN
INTRAMUSCULAR | Status: DC | PRN
  Administered 2019-01-29: 25 ug via INTRAVENOUS
  Administered 2019-01-29: 50 ug via INTRAVENOUS
  Administered 2019-01-29: 25 ug via INTRAVENOUS

## 2019-01-29 MED ORDER — SUCCINYLCHOLINE CHLORIDE 20 MG/ML IJ SOLN
INTRAMUSCULAR | Status: AC
  Filled 2019-01-29: qty 1

## 2019-01-29 MED ORDER — LIDOCAINE HCL (PF) 2 % IJ SOLN
INTRAMUSCULAR | Status: AC
  Filled 2019-01-29: qty 10

## 2019-01-29 MED ORDER — PROPOFOL 10 MG/ML IV BOLUS
INTRAVENOUS | Status: DC | PRN
  Administered 2019-01-29: 15 mg via INTRAVENOUS

## 2019-01-29 SURGICAL SUPPLY — 55 items
APPLIER CLIP 11 MED OPEN (CLIP)
APPLIER CLIP 9.375 SM OPEN (CLIP)
BAG DECANTER FOR FLEXI CONT (MISCELLANEOUS) ×3 IMPLANT
BLADE SURG SZ11 CARB STEEL (BLADE) ×3 IMPLANT
BOOT SUTURE AID YELLOW STND (SUTURE) ×3 IMPLANT
BRUSH SCRUB EZ  4% CHG (MISCELLANEOUS) ×2
BRUSH SCRUB EZ 4% CHG (MISCELLANEOUS) ×1 IMPLANT
CANISTER SUCT 1200ML W/VALVE (MISCELLANEOUS) ×3 IMPLANT
CHLORAPREP W/TINT 26 (MISCELLANEOUS) ×3 IMPLANT
CLIP APPLIE 11 MED OPEN (CLIP) IMPLANT
CLIP APPLIE 9.375 SM OPEN (CLIP) IMPLANT
COVER WAND RF STERILE (DRAPES) ×3 IMPLANT
DERMABOND ADVANCED (GAUZE/BANDAGES/DRESSINGS) ×2
DERMABOND ADVANCED .7 DNX12 (GAUZE/BANDAGES/DRESSINGS) ×1 IMPLANT
DRAPE 3/4 80X56 (DRAPES) ×3 IMPLANT
DRESSING SURGICEL FIBRLLR 1X2 (HEMOSTASIS) ×1 IMPLANT
DRSG SURGICEL FIBRILLAR 1X2 (HEMOSTASIS) ×3
ELECT CAUTERY BLADE 6.4 (BLADE) ×3 IMPLANT
ELECT REM PT RETURN 9FT ADLT (ELECTROSURGICAL) ×3
ELECTRODE REM PT RTRN 9FT ADLT (ELECTROSURGICAL) ×1 IMPLANT
GEL ULTRASOUND 20GR AQUASONIC (MISCELLANEOUS) IMPLANT
GLOVE BIO SURGEON STRL SZ7 (GLOVE) ×3 IMPLANT
GLOVE INDICATOR 7.5 STRL GRN (GLOVE) ×3 IMPLANT
GLOVE SURG SYN 8.0 (GLOVE) ×3 IMPLANT
GOWN STRL REUS W/ TWL LRG LVL3 (GOWN DISPOSABLE) ×2 IMPLANT
GOWN STRL REUS W/ TWL XL LVL3 (GOWN DISPOSABLE) ×1 IMPLANT
GOWN STRL REUS W/TWL LRG LVL3 (GOWN DISPOSABLE) ×4
GOWN STRL REUS W/TWL XL LVL3 (GOWN DISPOSABLE) ×2
IV NS 500ML (IV SOLUTION) ×2
IV NS 500ML BAXH (IV SOLUTION) ×1 IMPLANT
KIT TURNOVER KIT A (KITS) ×3 IMPLANT
LABEL OR SOLS (LABEL) ×3 IMPLANT
LOOP RED MAXI  1X406MM (MISCELLANEOUS) ×2
LOOP VESSEL MAXI 1X406 RED (MISCELLANEOUS) ×1 IMPLANT
LOOP VESSEL MINI 0.8X406 BLUE (MISCELLANEOUS) ×1 IMPLANT
LOOPS BLUE MINI 0.8X406MM (MISCELLANEOUS) ×2
NEEDLE FILTER BLUNT 18X 1/2SAF (NEEDLE) ×2
NEEDLE FILTER BLUNT 18X1 1/2 (NEEDLE) ×1 IMPLANT
NEEDLE HYPO 30X.5 LL (NEEDLE) IMPLANT
PACK EXTREMITY ARMC (MISCELLANEOUS) ×3 IMPLANT
PAD PREP 24X41 OB/GYN DISP (PERSONAL CARE ITEMS) ×3 IMPLANT
STOCKINETTE STRL 4IN 9604848 (GAUZE/BANDAGES/DRESSINGS) ×3 IMPLANT
SUT MNCRL+ 5-0 UNDYED PC-3 (SUTURE) ×1 IMPLANT
SUT MONOCRYL 5-0 (SUTURE) ×2
SUT PROLENE 6 0 BV (SUTURE) ×18 IMPLANT
SUT SILK 2 0 (SUTURE) ×2
SUT SILK 2-0 18XBRD TIE 12 (SUTURE) ×1 IMPLANT
SUT SILK 3 0 (SUTURE) ×2
SUT SILK 3-0 18XBRD TIE 12 (SUTURE) ×1 IMPLANT
SUT SILK 4 0 (SUTURE) ×2
SUT SILK 4-0 18XBRD TIE 12 (SUTURE) ×1 IMPLANT
SUT VIC AB 3-0 SH 27 (SUTURE) ×2
SUT VIC AB 3-0 SH 27X BRD (SUTURE) ×1 IMPLANT
SYR 20ML LL LF (SYRINGE) ×3 IMPLANT
SYR 3ML LL SCALE MARK (SYRINGE) ×3 IMPLANT

## 2019-01-29 NOTE — Transfer of Care (Signed)
Immediate Anesthesia Transfer of Care Note  Patient: [name]  Procedure(s) Performed: ARTERIOVENOUS (AV) FISTULA CREATION ( BRACHIAL CEPHALIC) (Left )  Patient Location: PACU  Anesthesia Type:General  Level of Consciousness: sedated  Airway & Oxygen Therapy: Patient Spontanous Breathing and Patient connected to face mask oxygen  Post-op Assessment: Report given to RN and Post -op Vital signs reviewed and stable  Post vital signs: Reviewed and stable  Last Vitals:  Vitals Value Taken Time  BP 106/63 01/29/19 1132  Temp 36.4 C 01/29/19 1132  Pulse 73 01/29/19 1133  Resp 16 01/29/19 1133  SpO2 96 % 01/29/19 1133  Vitals shown include unvalidated device data.  Last Pain:  Vitals:   01/29/19 1132  TempSrc:   PainSc: 0-No pain      Patients Stated Pain Goal: 0 (94/07/68 0881)  Complications: No apparent anesthesia complications

## 2019-01-29 NOTE — Anesthesia Procedure Notes (Addendum)
Anesthesia Regional Block: Supraclavicular block   Pre-Anesthetic Checklist: ,, timeout performed, Correct Patient, Correct Site, Correct Laterality, Correct Procedure, Correct Position, site marked, Risks and benefits discussed,  Surgical consent,  Pre-op evaluation,  At surgeon's request and post-op pain management  Laterality: Left  Prep: chloraprep       Needles:  Injection technique: Single-shot  Needle Type: Echogenic Stimulator Needle     Needle Length: 10cm  Needle Gauge: 21   Needle insertion depth: 7 cm   Additional Needles:   Procedures: Doppler guided,,,, ultrasound used (permanent image in chart),,,,  Motor weakness within 4 minutes.  Narrative:  Start time: 01/29/2019 9:17 AM End time: 01/29/2019 9:29 AM Injection made incrementally with aspirations every 5 mL.  Performed by: Personally  Anesthesiologist: Alphonsus Sias, MD  Additional Notes: Functioning IV was confirmed and O2, monitors were applied.  A 167mm 21ga Stimuplex needle was used. Sterile prep and drape,hand hygiene and sterile gloves were used.  Negative aspiration and negative test dose prior to incremental administration of local anesthetic. The patient tolerated the procedure well. 50ml naropin 0.5%/1:200 epi/8mg  decadron. 3cc 1% lido for skin. Images stored

## 2019-01-29 NOTE — Anesthesia Preprocedure Evaluation (Addendum)
Anesthesia Evaluation  Patient identified by MRN, date of birth, ID band Patient awake    Reviewed: Allergy & Precautions, H&P , NPO status , reviewed documented beta blocker date and time   Airway Mallampati: III  TM Distance: >3 FB Neck ROM: full    Dental  (+) Chipped, Missing   Pulmonary shortness of breath, sleep apnea , former smoker,    Pulmonary exam normal        Cardiovascular hypertension, +CHF  Normal cardiovascular exam+ dysrhythmias + Valvular Problems/Murmurs      Neuro/Psych PSYCHIATRIC DISORDERS Anxiety Depression  Neuromuscular disease    GI/Hepatic neg GERD  ,  Endo/Other  diabetesHypothyroidism Morbid obesity  Renal/GU Renal disease     Musculoskeletal  (+) Arthritis ,   Abdominal   Peds  Hematology  (+) Blood dyscrasia, anemia ,   Anesthesia Other Findings Past Medical History: No date: Anemia No date: Anxiety No date: Aortic valve stenosis No date: Arrhythmia     Comment:  atrial fibrillation No date: Arthritis     Comment:  feet, legs No date: B12 deficiency No date: Bowel obstruction (HCC) No date: CHF (congestive heart failure) (HCC) No date: CKD (chronic kidney disease)     Comment:  protein in urine No date: Colostomy in place [facility]) No date: Diabetes mellitus without complication (HCC) No date: Dialysis patient [facility])     Comment:  T/Th/Sa No date: Diverticulitis large intestine No date: Dysrhythmia No date: Ectopic atrial tachycardia (HCC) No date: FSGS (focal segmental glomerulosclerosis) No date: Gastritis No date: GI bleed No date: Heart murmur No date: Hyperlipidemia No date: Hypertension No date: Hypothyroidism No date: Hypothyroidism     Comment:  unspecified No date: MRSA (methicillin resistant Staphylococcus aureus)     Comment:  at abdominal wound.  Jan 2017.  Treated.  No date: Neuropathy No date: Neuropathy in diabetes ([place]) No date: Obesity No date: Pedal  edema No date: Psoriasis No date: Shortness of breath dyspnea No date: Sleep apnea     Comment:  CPAP No date: Vertigo  Past Surgical History: No date: ABDOMINAL HYSTERECTOMY 04/26/1827: APPLICATION OF WOUND VAC; N/A     Comment:  Procedure: APPLICATION OF WOUND VAC;  Surgeon: Clayburn Pert, MD;  Location: [facility];  Service: General;                Laterality: N/A; 10/22/7167: APPLICATION OF WOUND VAC; N/A     Comment:  Procedure: APPLICATION OF WOUND VAC;  Surgeon: Clayburn Pert, MD;  Location: [facility];  Service: General;                Laterality: N/A; No date: BACK SURGERY     Comment:  spur frmoved from lower back No date: CARDIAC CATHETERIZATION 08/2017: CARDIAC VALVE REPLACEMENT     Comment:  [facility] 07/15/2018: CARDIOVERSION; N/A     Comment:  Procedure: CARDIOVERSION;  Surgeon: Corey Skains,               MD;  Location: [facility];  Service: Cardiovascular;                Laterality: N/A; 11/16/2018: CATARACT EXTRACTION W/PHACO; Right     Comment:  Procedure: CATARACT EXTRACTION PHACO AND INTRAOCULAR               LENS PLACEMENT (IOC) RIGHT  DIABETiC  01:18.3  13.5%                10.82;  Surgeon: Eulogio Bear, MD;  Location:               [place];  Service: Ophthalmology;                Laterality: Right;  Diabetic - insulin sleep apnea 12/07/2018: CATARACT EXTRACTION W/PHACO; Left     Comment:  Procedure: CATARACT EXTRACTION PHACO AND INTRAOCULAR               LENS PLACEMENT (IOC) LEFT DIABETIC 01:02.6  8.8%  5.77;                Surgeon: Eulogio Bear, MD;  Location: [place];  Service: Ophthalmology;  Laterality: Left;              Diabetic - insulin No date: CHOLECYSTECTOMY No date: COLECTOMY 09/04/2016: COLONOSCOPY WITH PROPOFOL; N/A     Comment:  Procedure: COLONOSCOPY WITH PROPOFOL;  Surgeon: Manya Silvas, MD;  [facility]: [facility];  Service:                Endoscopy;  Laterality: N/A; 07/12/2015: COLOSTOMY REVERSAL; N/A     Comment:  Procedure: COLOSTOMY REVERSAL;  Surgeon: Clayburn Pert, MD;  Location: [facility];  Service: General;                Laterality: N/A; 02/22/2015: DEBRIDEMENT OF ABDOMINAL WALL ABSCESS; N/A     Comment:  Procedure: DEBRIDEMENT OF ABDOMINAL WALL ABSCESS;                Surgeon: Clayburn Pert, MD;  Location: [facility];                Service: General;  Laterality: N/A; 10/20/2018: DIALYSIS/PERMA CATHETER INSERTION; N/A     Comment:  Procedure: DIALYSIS/PERMA CATHETER INSERTION;  Surgeon:               Katha Cabal, MD;  Location: [place] CV LAB;               Service: Cardiovascular;  Laterality: N/A; 02/24/2015: EXCISION MASS ABDOMINAL; N/A     Comment:  Procedure: EXCISION MASS ABDOMINAL  / [place];                Surgeon: Clayburn Pert, MD;  Location: [facility];                Service: General;  Laterality: N/A; 02/26/2015: EXCISION MASS ABDOMINAL; N/A     Comment:  Procedure: EXCISION MASS ABDOMINAL/wash out;  Surgeon:               Clayburn Pert, MD;  Location: [facility];  Service:               General;  Laterality: N/A; 06/19/2015: FLEXIBLE SIGMOIDOSCOPY     Comment:  Procedure: FLEXIBLE SIGMOIDOSCOPY;  Surgeon: Lucilla Lame, MD;  Location: [place];  Service:               Endoscopy;;  UNABLE TO [place] INTO  COLON No date: HERNIA REPAIR     Comment:  umbilical 0/38/8828: LAPAROSCOPY     Comment:  Procedure: LAPAROSCOPY DIAGNOSTIC;  Surgeon: Clayburn Pert, MD;  Location: [facility];  Service: General;; 07/12/2015: LAPAROTOMY     Comment:  Procedure: EXPLORATORY LAPAROTOMY;  Surgeon: Clayburn Pert, MD;  Location: [facility];  Service: General;; 02/06/2015: LAPAROTOMY; N/A     Comment:  Procedure: Laparotomy, reduction of incarcerated               parastomal hernia, repair of parastomal hernia  with mesh;              Surgeon: Sherri Rad, MD;  Location: [facility];  Service:               General;  Laterality: N/A; 05/09/2013: laparotomy closure of cecal perforation     Comment:  Dr. Marina Gravel 07/12/2015: LYSIS OF ADHESION     Comment:  Procedure: LYSIS OF ADHESION;  Surgeon: Clayburn Pert,              MD;  Location: [facility];  Service: General;; No date: TONSILLECTOMY 03/03/2015: WOUND DEBRIDEMENT; N/A     Comment:  Procedure: DEBRIDEMENT ABDOMINAL WOUND;  Surgeon:               Florene Glen, MD;  Location: [facility];  Service:               General;  Laterality: N/A; 03/09/2015: WOUND DEBRIDEMENT; N/A     Comment:  Procedure: DEBRIDEMENT ABDOMINAL WOUND;  Surgeon:               Florene Glen, MD;  Location: [facility];  Service:               General;  Laterality: N/A;  BMI    Body Mass Index: 40.74 kg/m      Reproductive/Obstetrics                            Anesthesia Physical Anesthesia Plan  ASA: IV  Anesthesia Plan: General   Post-op Pain Management:  Regional for Post-op pain   Induction: Intravenous  PONV Risk Score and Plan: 2 and TIVA, Ondansetron, Midazolam and Treatment may vary due to age or medical condition  Airway Management Planned: Nasal Cannula and Natural Airway  Additional Equipment:   Intra-op Plan:   Post-operative Plan:   Informed Consent: I have reviewed the patients History and Physical, chart, labs and discussed the procedure including the risks, benefits and alternatives for the proposed anesthesia with the patient or authorized representative who has indicated his/her understanding and acceptance.     Dental Advisory Given  Plan Discussed with: CRNA  Anesthesia Plan Comments:         Anesthesia Quick Evaluation

## 2019-01-29 NOTE — H&P (Signed)
[facility] History & Physical Update  The patient was interviewed and re-examined.  [name] previous History and Physical has been reviewed and is unchanged.  There is no change in the plan of care. We plan to proceed with the scheduled procedure.  Hortencia Pilar, MD  01/29/2019, 7:38 AM

## 2019-01-29 NOTE — Anesthesia Post-op Follow-up Note (Signed)
Anesthesia QCDR form completed.        

## 2019-01-29 NOTE — Discharge Instructions (Signed)

## 2019-01-29 NOTE — Op Note (Signed)
     OPERATIVE NOTE   PROCEDURE: left brachial cephalic arteriovenous fistula placement  PRE-OPERATIVE DIAGNOSIS: End Stage Renal Disease  POST-OPERATIVE DIAGNOSIS: End Stage Renal Disease  SURGEON: Hortencia Pilar  ASSISTANT(S): Ms. Hezzie Bump  ANESTHESIA: regional  ESTIMATED BLOOD LOSS: <50 cc  FINDING(S): 4 mm cephalic vein  SPECIMEN(S):  none  INDICATIONS:   [name] is a 68 y.o. female who presents with end stage renal disease.  The patient is scheduled for left brachiocephalic arteriovenous fistula placement.  The patient is aware the risks include but are not limited to: bleeding, infection, steal syndrome, nerve damage, ischemic monomelic neuropathy, failure to mature, and need for additional procedures.  The patient is aware of the risks of the procedure and elects to proceed forward.  DESCRIPTION: After full informed written consent was obtained from the patient, the patient was brought back to the operating room and placed supine upon the operating table.  Prior to induction, the patient received IV antibiotics.   After obtaining adequate anesthesia, the patient was then prepped and draped in the standard fashion for a left arm access procedure.   A first assistant was required to provide a safe and appropriate environment for executing the surgery.  The assistant was integral in providing retraction, exposure, running suture providing suction and in the closing process.   A curvilinear incision was then created midway between the radial impulse and the cephalic vein. The cephalic vein was then identified and dissected circumferentially. It was marked with a surgical marker.    Attention was then turned to the brachial artery which was exposed through the same incision and looped proximally and distally. Side branches were controlled with 4-0 silk ties.  The distal segment of the vein was ligated with a  2-0 silk, and the vein was transected.  The proximal  segment was interrogated with serial dilators.  The vein accepted up to a 4 mm dilator without any difficulty. Heparinized saline was infused into the vein and clamped it with a small bulldog.  At this point, I reset my exposure of the brachial artery and controlled the artery with vessel loops proximally and distally.  An arteriotomy was then made with a #11 blade, and extended with a Potts scissor.  Heparinized saline was injected proximal and distal into the radial artery.  The vein was then approximated to the artery while the artery was in its native bed and subsequently the vein was beveled using Potts scissors. The vein was then sewn to the artery in an end-to-side configuration with a running stitch of 6-0 Prolene.  Prior to completing this anastomosis Flushing maneuvers were performed and the artery was allowed to forward and back bleed.  There was no evidence of clot from any vessels.  I completed the anastomosis in the usual fashion and then released all vessel loops and clamps.    There was good  thrill in the venous outflow, and there was 1+ palpable radial pulse.  At this point, I irrigated out the surgical wound.  There was no further active bleeding.  The subcutaneous tissue was reapproximated with a running stitch of 3-0 Vicryl.  The skin was then reapproximated with a running subcuticular stitch of 4-0 Vicryl.  The skin was then cleaned, dried, and reinforced with Dermabond.    The patient tolerated this procedure well.   COMPLICATIONS: None  CONDITION: Margaretmary Dys [facility]  Office: [phone]   01/29/2019, 11:23 AM

## 2019-01-30 LAB — TYPE AND SCREEN
ABO/RH(D): A POS
Antibody Screen: NEGATIVE
PT AG Type: NEGATIVE
Unit division: 0
Unit division: 0

## 2019-01-30 LAB — BPAM RBC
Blood Product Expiration Date: 202012232359
Blood Product Expiration Date: 202012252359
Unit Type and Rh: 6200
Unit Type and Rh: 6200

## 2019-01-30 NOTE — Anesthesia Postprocedure Evaluation (Signed)
Anesthesia Post Note  Patient: [name]  Procedure(s) Performed: ARTERIOVENOUS (AV) FISTULA CREATION ( BRACHIAL CEPHALIC) (Left )  Patient location during evaluation: PACU Anesthesia Type: General Level of consciousness: awake and alert Pain management: pain level controlled Vital Signs Assessment: post-procedure vital signs reviewed and stable Respiratory status: spontaneous breathing, nonlabored ventilation and respiratory function stable (Initially low SpO2, CXR and auscultation not significant. Eventually kept 94% on RA. Stable at discharge) Cardiovascular status: blood pressure returned to baseline and stable Postop Assessment: no apparent nausea or vomiting Anesthetic complications: no     Last Vitals:  Vitals:   01/29/19 1541 01/29/19 1603  BP: 114/69 109/66  Pulse: 70 70  Resp: 16 18  Temp:  (!) 36.2 C  SpO2: 95% 94%    Last Pain:  Vitals:   01/29/19 1603  TempSrc: Temporal  PainSc: 0-No pain                 Alphonsus Sias

## 2019-03-30 ENCOUNTER — Telehealth: Payer: Self-pay

## 2019-03-30 ENCOUNTER — Encounter: Payer: Self-pay | Admitting: Pain Medicine

## 2019-03-30 NOTE — Progress Notes (Signed)
Patient: [name]  Service Category: E/M  Provider: Gaspar Cola, MD  DOB: 07/18/1950  DOS: 03/31/2019  Location: Office  MRN: [id number]  Setting: Ambulatory outpatient  Referring Provider: Adin Hector, MD  Type: Established Patient  Specialty: Interventional Pain Management  PCP: Adin Hector, MD  Location: Remote location  Delivery: TeleHealth     Virtual Encounter - Pain Management PROVIDER NOTE: Information contained herein reflects review and annotations entered in association with encounter. Interpretation of such information and data should be left to medically-trained personnel. Information provided to patient can be located elsewhere in the medical record under "Patient Instructions". Document created using STT-dictation technology, any transcriptional errors that may result from process are unintentional.    Contact & Pharmacy Preferred: [phone] Home: [phone] (home) Mobile: [phone] (mobile) E-mail: No e-mail address on record  [id number] [id number]: [phone]: [phone]  Pre-screening  [name] "in-person" vs "virtual" encounter. She indicated preferring virtual for this encounter.   Reason COVID-19*  Social distancing based on CDC and AMA recommendations.   I contacted SBufford Spikeson 03/31/2019 via telephone.      I clearly identified myself as FGaspar Cola MD. I verified that I was speaking with the correct person using two identifiers (Name: SWANETA FITTING and date of birth: [birth year].  Consent I sought verbal advanced consent from SBufford Spikesfor virtual visit interactions. I informed Ms. ARolloof possible security and privacy concerns, risks, and limitations associated with providing "not-in-person" medical evaluation and management services. I also informed Ms. AZenia Residesof the availability of "in-person" appointments. Finally, I informed  her that there would be a charge for the virtual visit and that she could be  personally, fully or partially, financially responsible for it. Ms. ACunananexpressed understanding and agreed to proceed.   Historic Elements   Ms. SLEVITA MONICALis a 69y.o. year old, female patient evaluated today after her last contact with our practice on 03/30/2019. Ms. ACoupland has a past medical history of Anemia, Anxiety, Aortic valve stenosis, Arrhythmia, Arthritis, B12 deficiency, Bowel obstruction ([place], CHF (congestive heart failure) ([place], CKD (chronic kidney disease), [facility] in place ([facility], Diabetes mellitus without complication ([place], [facility] patient [facility], Diverticulitis large intestine, Dysrhythmia, Ectopic atrial tachycardia ([place], FSGS (focal segmental glomerulosclerosis), Gastritis, GI bleed, Heart murmur, Hyperlipidemia, Hypertension, Hypothyroidism, Hypothyroidism, MRSA (methicillin resistant Staphylococcus aureus), Neuropathy, Neuropathy in diabetes ([place], Obesity, Pedal edema, Psoriasis, Shortness of breath dyspnea, Sleep apnea, and Vertigo. She also  has a past surgical history that includes Colectomy; laparotomy closure of cecal perforation (05/09/2013); Tonsillectomy; Back surgery; Cardiac catheterization; Abdominal hysterectomy; Debridement of abdominal wall abscess (N/A, 02/22/2015); Excision mass abdominal (N/A, 02/24/2015); Application if wound vac (N/A, 02/24/2015); Excision mass abdominal (N/A, 02/26/2015); Application if wound vac (N/A, 02/26/2015); Wound debridement (N/A, 03/03/2015); Wound debridement (N/A, 03/09/2015); Flexible sigmoidoscopy (06/19/2015); Colostomy reversal (N/A, 07/12/2015); laparotomy (07/12/2015); laparoscopy (07/12/2015); Lysis of adhesion (07/12/2015); laparotomy (N/A, 02/06/2015); Colonoscopy with propofol (N/A, 09/04/2016); Cardiac valve replacement (08/2017); Cardioversion (N/A, 07/15/2018); DIALYSIS/PERMA CATHETER INSERTION (N/A, 10/20/2018); Cataract extraction w/PHACO (Right, 11/16/2018);  Cataract extraction w/PHACO (Left, 12/07/2018); Cholecystectomy; Hernia repair; and AV fistula placement (Left, 01/29/2019). Ms. ASchrierhas a current medication list which includes the following prescription(s): amiodarone, amlodipine, apixaban, atorvastatin, benzonatate, betamethasone valerate, duloxetine, dupixent, glucose blood, humalog kwikpen, hydrocortisone, lantus solostar, levothyroxine, metoprolol succinate, onetouch delica lancets 349S oxycodone, telmisartan, torsemide, and  vitamin d (ergocalciferol). She  reports that she quit smoking about 5 years ago. Her smoking use included cigarettes. She has never used smokeless tobacco. She reports that she does not drink alcohol or use drugs. Ms. Turay is allergic to buspirone; lisinopril; metformin; pravastatin; sitagliptin; tramadol; diltiazem hcl; gabapentin; hydralazine; and lovastatin.   HPI  Today, she is being contacted for medication management. Katha Cabal, MD prescribed some hydrocodone after a vascular procedure performed on 01/29/2019.  The patient indicates doing well with the current medication regimen. No adverse reactions or side effects reported to the medications.   Pharmacotherapy Assessment  Analgesic: Oxycodone IR 52m, 1 tab PO q 8hrs (119mday of oxycodone) MME/day:22.89m89may.   Monitoring: [place] PMP: PDMP reviewed during this encounter.       Pharmacotherapy: No side-effects or adverse reactions reported. Compliance: No problems identified. Effectiveness: Clinically acceptable. Plan: Refer to "POC".  UDS:  Summary  Date Value Ref Range Status  04/09/2018 FINAL  Final    Comment:    ==================================================================== TOXASSURE SELECT 13 (MW) ==================================================================== Test                             Result       Flag       Units Drug Present not Declared for Prescription Verification   Oxycodone                      386           UNEXPECTED ng/mg creat   Oxymorphone                    614          UNEXPECTED ng/mg creat   Noroxycodone                   349          UNEXPECTED ng/mg creat   Noroxymorphone                 141          UNEXPECTED ng/mg creat    Sources of oxycodone are scheduled prescription medications.    Oxymorphone, noroxycodone, and noroxymorphone are expected    metabolites of oxycodone. Oxymorphone is also available as a    scheduled prescription medication. ==================================================================== Test                      Result    Flag   Units      Ref Range   Creatinine              76               mg/dL      >=20 ==================================================================== Declared Medications:  The flagging and interpretation on this report are based on the  following declared medications.  Unexpected results may arise from  inaccuracies in the declared medications.  **Note: The testing scope of this panel does not include following  reported medications:  Amlodipine Besylate  Apixaban  Atorvastatin  Clobetasol  Duloxetine  Insulin (Lantus)  Levothyroxine ==================================================================== For clinical consultation, please call [phone]===    Laboratory Chemistry Profile   Renal Lab Results  Component Value Date   BUN 32 (H) 01/29/2019   CREATININE 3.50 (H) 01/29/2019   LABCREA 26 07/18/2015   GFRAA 20 (L) 01/26/2019   GFRNONAA 17 (L) 01/26/2019  Hepatic Lab Results  Component Value Date   AST 19 07/24/2017   ALT 14 07/24/2017   ALBUMIN 3.8 07/24/2017   ALKPHOS 59 07/24/2017   LIPASE 21 02/04/2015    Electrolytes Lab Results  Component Value Date   NA 137 01/29/2019   K 4.6 01/29/2019   CL 100 01/29/2019   CALCIUM 8.5 (L) 01/26/2019   MG 2.2 11/07/2015   PHOS 4.2 07/21/2015    Bone Lab Results  Component Value  Date   25OHVITD1 43 11/07/2015   25OHVITD2 38 11/07/2015   25OHVITD3 5.4 11/07/2015    Coagulation Lab Results  Component Value Date   INR 1.0 01/29/2019   LABPROT 13.0 01/29/2019   APTT 34 01/26/2019   PLT 173 01/26/2019    Cardiovascular Lab Results  Component Value Date   BNP 307.0 (H) 07/13/2018   TROPONINI <0.03 07/12/2018   HGB 14.6 01/29/2019   HCT 43.0 01/29/2019    Inflammation (CRP: Acute Phase) (ESR: Chronic Phase) Lab Results  Component Value Date   CRP 0.6 11/07/2015   ESRSEDRATE 16 11/07/2015   LATICACIDVEN 0.8 02/21/2015      Note: Above Lab results reviewed.  Imaging  X-ray chest PA or AP CLINICAL DATA:  Hypoxia.  Dialysis patient.  EXAM: CHEST  1 VIEW  COMPARISON:  07/12/2018  FINDINGS: Decreased inspiration with no gross change in enlargement of the cardiac silhouette and tortuosity and calcification of the aorta. Interval right jugular catheter with its tip in the superior vena cava. No pneumothorax. Interval minimal linear atelectasis at the left lung base. Stable surgical clips overlying the left upper lung zone and cervical spine fixation hardware. Moderate bilateral acromioclavicular joint degenerative spur formation.  IMPRESSION: 1. Interval minimal linear atelectasis at the left lung base. 2. Poor inspiration with grossly stable cardiomegaly.  Electronically Signed   By: Claudie Revering M.D.   On: 01/29/2019 14:10 [place] OR NERVE BLOCK-IMAGE ONLY ([place]) There is no interpretation for this exam.    This order is for images obtained during a surgical procedure.  Please See  "Surgeries" Tab for more information regarding the procedure.  Assessment  The primary encounter diagnosis was Chronic pain syndrome. Diagnoses of Chronic low back pain (Primary Area of Pain) (Bilateral) (L>R), Chronic lower extremity pain (Secondary area of Pain) (Bilateral) (R>L), and Chronic abdominal pain (Third area of Pain) (Bilateral) (L>R) were also pertinent to  this visit.  Plan of Care  Problem-specific:  No problem-specific Assessment & Plan notes found for this encounter.  I have discontinued Eliah Ozawa. Bortle's calcitRIOL and HYDROcodone-acetaminophen. I am also having her maintain her amLODipine, OneTouch Delica Lancets 32D, glucose blood, atorvastatin, DULoxetine, levothyroxine, apixaban, metoprolol succinate, Vitamin D (Ergocalciferol), amiodarone, Dupixent, hydrocortisone, torsemide, HumaLOG KwikPen, Lantus SoloStar, telmisartan, betamethasone valerate, oxyCODONE, and benzonatate.  Pharmacotherapy (Medications Ordered): No orders of the defined types were placed in this encounter.  Orders:  No orders of the defined types were placed in this encounter.  Follow-up plan:   Return in about 13 weeks (around 06/30/2019) for (VV), (MM).      Interventional treatment options: Under consideration:   NOTE: Currently on dialysis. Diagnostic bilateral lumbar facet block  Possible bilateral lumbar facet RFA.  Diagnostic bilateral celiac plexus block  Diagnostic bilateral IA hip injection  Diagnostic bilateral femoral nerve and obturator NB  Possible bilateral femoral nerve and obturator nerve RFA.  Diagnostic bilateral IA knee joint injection  Possible series of 5 bilateral IA Hyalgan knee injections.  Diagnostic bilateral genicular NB  Possible bilateral genicular nerve RFA    Palliative PRN treatment(s):   None at this time.     Recent Visits Date Type Provider Dept  01/04/19 Telemedicine Milinda Pointer, MD [facility]  Showing recent visits within past 90 days and meeting all other requirements   Today's Visits Date Type Provider Dept  03/31/19 Telemedicine Milinda Pointer, MD [facility]  Showing today's visits and meeting all other requirements   Future Appointments No visits were found meeting these conditions.  Showing future appointments within next 90 days and meeting all other requirements   I  discussed the assessment and treatment plan with the patient. The patient was provided an opportunity to ask questions and all were answered. The patient agreed with the plan and demonstrated an understanding of the instructions.  Patient advised to call back or seek an in-person evaluation if the symptoms or condition worsens.  Duration of encounter: 13 minutes.  Note by: Gaspar Cola, MD Date: 03/31/2019; Time: 7:25 AM

## 2019-03-30 NOTE — Telephone Encounter (Signed)
Patient was not home at this time.  Will call back later.

## 2019-03-31 ENCOUNTER — Other Ambulatory Visit: Payer: Self-pay

## 2019-03-31 ENCOUNTER — Ambulatory Visit: Payer: Medicare Other | Attending: Pain Medicine | Admitting: Pain Medicine

## 2019-03-31 DIAGNOSIS — G894 Chronic pain syndrome: Secondary | ICD-10-CM | POA: Diagnosis not present

## 2019-03-31 DIAGNOSIS — M79605 Pain in left leg: Secondary | ICD-10-CM

## 2019-03-31 DIAGNOSIS — M79604 Pain in right leg: Secondary | ICD-10-CM

## 2019-03-31 DIAGNOSIS — M5441 Lumbago with sciatica, right side: Secondary | ICD-10-CM

## 2019-03-31 DIAGNOSIS — M5442 Lumbago with sciatica, left side: Secondary | ICD-10-CM | POA: Diagnosis not present

## 2019-03-31 DIAGNOSIS — R109 Unspecified abdominal pain: Secondary | ICD-10-CM

## 2019-03-31 DIAGNOSIS — G8929 Other chronic pain: Secondary | ICD-10-CM

## 2019-03-31 MED ORDER — OXYCODONE HCL 5 MG PO TABS: 5.0000 mg | ORAL_TABLET | Freq: Three times a day (TID) | ORAL | 0 refills | Status: DC | PRN

## 2019-04-09 ENCOUNTER — Other Ambulatory Visit (INDEPENDENT_AMBULATORY_CARE_PROVIDER_SITE_OTHER): Payer: Self-pay | Admitting: Vascular Surgery

## 2019-04-09 DIAGNOSIS — Z992 Dependence on renal dialysis: Secondary | ICD-10-CM

## 2019-04-09 DIAGNOSIS — N186 End stage renal disease: Secondary | ICD-10-CM

## 2019-04-12 ENCOUNTER — Ambulatory Visit (INDEPENDENT_AMBULATORY_CARE_PROVIDER_SITE_OTHER): Payer: Medicare Other | Admitting: Vascular Surgery

## 2019-04-12 ENCOUNTER — Other Ambulatory Visit: Payer: Self-pay

## 2019-04-12 ENCOUNTER — Ambulatory Visit (INDEPENDENT_AMBULATORY_CARE_PROVIDER_SITE_OTHER): Payer: Medicare Other

## 2019-04-12 ENCOUNTER — Encounter (INDEPENDENT_AMBULATORY_CARE_PROVIDER_SITE_OTHER): Payer: Self-pay | Admitting: Vascular Surgery

## 2019-04-12 VITALS — BP 164/96 | HR 62 | Resp 12 | Ht 63.0 in | Wt 246.0 lb

## 2019-04-12 DIAGNOSIS — N186 End stage renal disease: Secondary | ICD-10-CM

## 2019-04-12 DIAGNOSIS — Z992 Dependence on renal dialysis: Secondary | ICD-10-CM | POA: Diagnosis not present

## 2019-04-16 ENCOUNTER — Encounter (INDEPENDENT_AMBULATORY_CARE_PROVIDER_SITE_OTHER): Payer: Self-pay | Admitting: Vascular Surgery

## 2019-04-16 NOTE — Progress Notes (Signed)
Patient ID: OTHA RICKLES, female   DOB: 1950/08/25, 69 y.o.   MRN: [id number]  Chief Complaint  Patient presents with  . Follow-up    HDA.     HPI MATSUE STROM is a 69 y.o. female.    The patient returns to the office for followup of their dialysis access. The patient denies hand pain or other symptoms consistent with steal phenomena.  No significant arm swelling.  The patient denies redness or swelling at the access site. The patient denies fever or chills at home or while on dialysis.  The patient denies amaurosis fugax or recent TIA symptoms. There are no recent neurological changes noted. The patient denies claudication symptoms or rest pain symptoms. The patient denies history of DVT, PE or superficial thrombophlebitis. The patient denies recent episodes of angina or shortness of breath.         Past Medical History:  Diagnosis Date  . Anemia   . Anxiety   . Aortic valve stenosis   . Arrhythmia    atrial fibrillation  . Arthritis    feet, legs  . B12 deficiency   . Bowel obstruction ([place])   . CHF (congestive heart failure) ([place])   . CKD (chronic kidney disease)    protein in urine  . Colostomy in place [facility])   . Diabetes mellitus without complication ([place])   . Dialysis patient [facility])    T/Th/Sa  . Diverticulitis large intestine   . Dysrhythmia   . Ectopic atrial tachycardia ([place])   . FSGS (focal segmental glomerulosclerosis)   . Gastritis   . GI bleed   . Heart murmur   . Hyperlipidemia   . Hypertension   . Hypothyroidism   . Hypothyroidism    unspecified  . MRSA (methicillin resistant Staphylococcus aureus)    at abdominal wound.  Jan 2017.  Treated.   . Neuropathy   . Neuropathy in diabetes ([place])   . Obesity   . Pedal edema   . Psoriasis   . Shortness of breath dyspnea   . Sleep apnea    CPAP  . Vertigo     Past Surgical History:  Procedure Laterality Date  . ABDOMINAL HYSTERECTOMY    . APPLICATION OF WOUND VAC N/A 02/24/2015   Procedure: APPLICATION OF WOUND VAC;  Surgeon: Clayburn Pert, MD;  Location: [facility];  Service: General;  Laterality: N/A;  . APPLICATION OF WOUND VAC N/A 02/26/2015   Procedure: APPLICATION OF WOUND VAC;  Surgeon: Clayburn Pert, MD;  Location: [facility];  Service: General;  Laterality: N/A;  . AV FISTULA PLACEMENT Left 01/29/2019   Procedure: ARTERIOVENOUS (AV) FISTULA CREATION ( BRACHIAL CEPHALIC);  Surgeon: Katha Cabal, MD;  Location: [facility];  Service: Vascular;  Laterality: Left;  . BACK SURGERY     spur frmoved from lower back  . CARDIAC CATHETERIZATION    . CARDIAC VALVE REPLACEMENT  08/2017   [facility]  . CARDIOVERSION N/A 07/15/2018   Procedure: CARDIOVERSION;  Surgeon: Corey Skains, MD;  Location: [facility];  Service: Cardiovascular;  Laterality: N/A;  . CATARACT EXTRACTION W/PHACO Right 11/16/2018   Procedure: CATARACT EXTRACTION PHACO AND INTRAOCULAR LENS PLACEMENT (IOC) RIGHT DIABETiC  01:18.3  13.5%  10.82;  Surgeon: Eulogio Bear, MD;  Location: [place];  Service: Ophthalmology;  Laterality: Right;  Diabetic - insulin sleep apnea  . CATARACT EXTRACTION W/PHACO Left 12/07/2018   Procedure: CATARACT EXTRACTION PHACO AND INTRAOCULAR LENS PLACEMENT (IOC) LEFT DIABETIC 01:02.6  8.8%  5.77;  Surgeon: Eulogio Bear, MD;  Location: [place];  Service: Ophthalmology;  Laterality: Left;  Diabetic - insulin  . CHOLECYSTECTOMY    . COLECTOMY    . COLONOSCOPY WITH PROPOFOL N/A 09/04/2016   Procedure: COLONOSCOPY WITH PROPOFOL;  Surgeon: Manya Silvas, MD;  Location: [facility]COPY;  Service: Endoscopy;  Laterality: N/A;  . COLOSTOMY REVERSAL N/A 07/12/2015   Procedure: COLOSTOMY REVERSAL;  Surgeon: Clayburn Pert, MD;  Location: [facility];  Service: General;  Laterality: N/A;  . DEBRIDEMENT OF ABDOMINAL WALL ABSCESS N/A 02/22/2015   Procedure: DEBRIDEMENT OF ABDOMINAL WALL ABSCESS;  Surgeon: Clayburn Pert, MD;  Location: [facility];  Service: General;   Laterality: N/A;  . DIALYSIS/PERMA CATHETER INSERTION N/A 10/20/2018   Procedure: DIALYSIS/PERMA CATHETER INSERTION;  Surgeon: Katha Cabal, MD;  Location: [place] [facility];  Service: Cardiovascular;  Laterality: N/A;  . EXCISION MASS ABDOMINAL N/A 02/24/2015   Procedure: EXCISION MASS ABDOMINAL  / [place];  Surgeon: Clayburn Pert, MD;  Location: [facility];  Service: General;  Laterality: N/A;  . EXCISION MASS ABDOMINAL N/A 02/26/2015   Procedure: EXCISION MASS ABDOMINAL/wash out;  Surgeon: Clayburn Pert, MD;  Location: [facility];  Service: General;  Laterality: N/A;  . FLEXIBLE SIGMOIDOSCOPY  06/19/2015   Procedure: FLEXIBLE SIGMOIDOSCOPY;  Surgeon: Lucilla Lame, MD;  Location: [place];  Service: Endoscopy;;  UNABLE TO ACCESS OSTOMY SITE FOR ACCESS INTO COLON  . HERNIA REPAIR     umbilical  . LAPAROSCOPY  07/12/2015   Procedure: LAPAROSCOPY DIAGNOSTIC;  Surgeon: Clayburn Pert, MD;  Location: [facility];  Service: General;;  . LAPAROTOMY  07/12/2015   Procedure: EXPLORATORY LAPAROTOMY;  Surgeon: Clayburn Pert, MD;  Location: [facility];  Service: General;;  . LAPAROTOMY N/A 02/06/2015   Procedure: Laparotomy, reduction of incarcerated parastomal hernia, repair of parastomal hernia with mesh;  Surgeon: Sherri Rad, MD;  Location: [facility];  Service: General;  Laterality: N/A;  . laparotomy closure of cecal perforation  05/09/2013   Dr. Marina Gravel  . LYSIS OF ADHESION  07/12/2015   Procedure: LYSIS OF ADHESION;  Surgeon: Clayburn Pert, MD;  Location: [facility];  Service: General;;  . TONSILLECTOMY    . WOUND DEBRIDEMENT N/A 03/03/2015   Procedure: DEBRIDEMENT ABDOMINAL WOUND;  Surgeon: Florene Glen, MD;  Location: [facility];  Service: General;  Laterality: N/A;  . WOUND DEBRIDEMENT N/A 03/09/2015   Procedure: DEBRIDEMENT ABDOMINAL WOUND;  Surgeon: Florene Glen, MD;  Location: [facility];  Service: General;  Laterality: N/A;      Allergies  Allergen Reactions  . Buspirone Other  (See Comments)    Weakness  . Lisinopril Cough  . Metformin Diarrhea    At 1000 mg dose  . Pravastatin Other (See Comments)    insomnia  . Sitagliptin Other (See Comments)    constipation  . Tramadol Itching  . Diltiazem Hcl Palpitations  . Gabapentin Palpitations  . Hydralazine Rash  . Lovastatin Palpitations    Current Outpatient Medications  Medication Sig Dispense Refill  . amLODipine (NORVASC) 5 MG tablet Take 1 tablet (5 mg total) by mouth daily. 30 tablet 1  . apixaban (ELIQUIS) 5 MG TABS tablet Take 5 mg by mouth 2 (two) times daily.    Marland Kitchen atorvastatin (LIPITOR) 40 MG tablet Take 40 mg by mouth daily.     Marland Kitchen azelastine (ASTELIN) 0.1 % nasal spray Place 1 spray into both nostrils 2 (two) times daily.    . benzonatate (TESSALON) 200 MG  capsule TAKE 1 CAPSULE BY MOUTH THREE TIMES A DAY AS NEEDED FOR COUGH FOR UP TO 7 DAYS    . betamethasone valerate (VALISONE) 0.1 % cream Apply 1 application topically 2 (two) times daily.     . DULoxetine (CYMBALTA) 30 MG capsule Take 30 mg by mouth daily.     . DUPIXENT 300 MG/2ML SOSY Inject 300 mg into the skin every 14 (fourteen) days.    Marland Kitchen glucose blood (ONE TOUCH ULTRA TEST) test strip TEST TWICE DAILY    . HUMALOG KWIKPEN 100 UNIT/ML KwikPen 6 units subcutaneous injection prior to meals (Patient taking differently: Inject 22-38 Units into the skin See admin instructions. Inject 22 units in the morning & 38 unit at supper) 15 mL 11  . insulin lispro (HUMALOG KWIKPEN) 100 UNIT/ML KwikPen Inject 22 units before breakfast, 10 units before lunch, and 38 units before supper    . LANTUS SOLOSTAR 100 UNIT/ML Solostar Pen Inject 25 Units into the skin at bedtime. (Patient taking differently: Inject 45 Units into the skin at bedtime. ) 15 mL 11  . levothyroxine (SYNTHROID, LEVOTHROID) 88 MCG tablet Take 88 mcg by mouth daily before breakfast.     . metoprolol succinate (TOPROL-XL) 50 MG 24 hr tablet Take 25 mg by mouth 2 (two) times daily.     Glory Rosebush DELICA LANCETS 37T MISC TEST TWICE DAILY    . oxyCODONE (OXY IR/ROXICODONE) 5 MG immediate release tablet Take 1 tablet (5 mg total) by mouth every 8 (eight) hours as needed for severe pain. Must last 30 days 90 tablet 0  . [START ON 05/04/2019] oxyCODONE (OXY IR/ROXICODONE) 5 MG immediate release tablet Take 1 tablet (5 mg total) by mouth every 8 (eight) hours as needed for severe pain. Must last 30 days 90 tablet 0  . [START ON 06/03/2019] oxyCODONE (OXY IR/ROXICODONE) 5 MG immediate release tablet Take 1 tablet (5 mg total) by mouth every 8 (eight) hours as needed for severe pain. Must last 30 days 90 tablet 0  . telmisartan (MICARDIS) 20 MG tablet Take 20 mg by mouth daily.    Marland Kitchen torsemide (DEMADEX) 20 MG tablet Take one tab po daily (can take another pill if short of breath or gains 3 lbs in one day or 5lbs in one week) (Patient taking differently: Take 40 mg by mouth daily. ) 60 tablet 0  . Vitamin D, Ergocalciferol, (DRISDOL) 1.25 MG (50000 UT) CAPS capsule Take 50,000 Units by mouth every Tuesday.    Marland Kitchen amiodarone (PACERONE) 200 MG tablet Take 200 mg by mouth daily.     . hydrocortisone (ANUSOL-HC) 25 MG suppository Place 25 mg rectally 2 (two) times daily as needed for hemorrhoids.     No current facility-administered medications for this visit.        Physical Exam BP (!) 164/96   Pulse 62   Resp 12   Ht 5\' 3"  (1.6 m)   Wt 246 lb (111.6 kg)   BMI 43.58 kg/m  Gen:  WD/WN, NAD Skin: incision C/D/I; good thrill good bruit     Assessment/Plan: 1. End stage renal disease ([place]) Recommend:  The patient is doing well and currently has adequate dialysis access. [name] dialysis center is not reporting any access issues. Flow pattern is stable when compared to the prior ultrasound.  The patient should have a duplex ultrasound of the dialysis access in 6 months. The patient will follow-up with me in the office after each ultrasound    -  VAS US DUPLEX DIALYSIS  ACCESS (AVF, AVG); Future      Hortencia Pilar 04/16/2019, 10:11 AM   This note was created with Dragon medical transcription system.  Any errors from dictation are unintentional.

## 2019-06-02 ENCOUNTER — Emergency Department: Payer: Medicare Other

## 2019-06-02 ENCOUNTER — Encounter: Payer: Self-pay | Admitting: Emergency Medicine

## 2019-06-02 ENCOUNTER — Emergency Department
Admission: EM | Admit: 2019-06-02 | Discharge: 2019-06-02 | Disposition: A | Discharge status: Home / Self Care | Payer: Medicare Other | Attending: Emergency Medicine | Admitting: Emergency Medicine

## 2019-06-02 ENCOUNTER — Other Ambulatory Visit: Payer: Self-pay

## 2019-06-02 DIAGNOSIS — Z79899 Other long term (current) drug therapy: Secondary | ICD-10-CM | POA: Diagnosis not present

## 2019-06-02 DIAGNOSIS — I509 Heart failure, unspecified: Secondary | ICD-10-CM | POA: Insufficient documentation

## 2019-06-02 DIAGNOSIS — R0600 Dyspnea, unspecified: Secondary | ICD-10-CM | POA: Insufficient documentation

## 2019-06-02 DIAGNOSIS — E1122 Type 2 diabetes mellitus with diabetic chronic kidney disease: Secondary | ICD-10-CM | POA: Insufficient documentation

## 2019-06-02 DIAGNOSIS — R0602 Shortness of breath: Secondary | ICD-10-CM | POA: Diagnosis present

## 2019-06-02 DIAGNOSIS — E039 Hypothyroidism, unspecified: Secondary | ICD-10-CM | POA: Diagnosis not present

## 2019-06-02 DIAGNOSIS — I132 Hypertensive heart and chronic kidney disease with heart failure and with stage 5 chronic kidney disease, or end stage renal disease: Secondary | ICD-10-CM | POA: Insufficient documentation

## 2019-06-02 DIAGNOSIS — Z7901 Long term (current) use of anticoagulants: Secondary | ICD-10-CM | POA: Insufficient documentation

## 2019-06-02 DIAGNOSIS — Z87891 Personal history of nicotine dependence: Secondary | ICD-10-CM | POA: Insufficient documentation

## 2019-06-02 DIAGNOSIS — Z992 Dependence on renal dialysis: Secondary | ICD-10-CM | POA: Diagnosis not present

## 2019-06-02 DIAGNOSIS — Z794 Long term (current) use of insulin: Secondary | ICD-10-CM | POA: Insufficient documentation

## 2019-06-02 DIAGNOSIS — N186 End stage renal disease: Secondary | ICD-10-CM | POA: Diagnosis not present

## 2019-06-02 DIAGNOSIS — R531 Weakness: Secondary | ICD-10-CM | POA: Diagnosis not present

## 2019-06-02 LAB — URINALYSIS, COMPLETE (UACMP) WITH MICROSCOPIC
Bilirubin Urine: NEGATIVE
Glucose, UA: NEGATIVE mg/dL
Hgb urine dipstick: NEGATIVE
Ketones, ur: NEGATIVE mg/dL
Nitrite: NEGATIVE
Protein, ur: NEGATIVE mg/dL
Specific Gravity, Urine: 1.005 (ref 1.005–1.030)
pH: 6 (ref 5.0–8.0)

## 2019-06-02 LAB — BASIC METABOLIC PANEL
Anion gap: 14 (ref 5–15)
BUN: 34 mg/dL — ABNORMAL HIGH (ref 8–23)
CO2: 27 mmol/L (ref 22–32)
Calcium: 8.7 mg/dL — ABNORMAL LOW (ref 8.9–10.3)
Chloride: 99 mmol/L (ref 98–111)
Creatinine, Ser: 4.24 mg/dL — ABNORMAL HIGH (ref 0.44–1.00)
GFR calc Af Amer: 12 mL/min — ABNORMAL LOW (ref >60)
GFR calc non Af Amer: 10 mL/min — ABNORMAL LOW (ref >60)
Glucose, Bld: 202 mg/dL — ABNORMAL HIGH (ref 70–99)
Potassium: 4.5 mmol/L (ref 3.5–5.1)
Sodium: 140 mmol/L (ref 135–145)

## 2019-06-02 LAB — CBC
HCT: 37 % (ref 36.0–46.0)
Hemoglobin: 11.1 g/dL — ABNORMAL LOW (ref 12.0–15.0)
MCH: 29.2 pg (ref 26.0–34.0)
MCHC: 30 g/dL (ref 30.0–36.0)
MCV: 97.4 fL (ref 80.0–100.0)
Platelets: 214 10*3/uL (ref 150–400)
RBC: 3.8 MIL/uL — ABNORMAL LOW (ref 3.87–5.11)
RDW: 15 % (ref 11.5–15.5)
WBC: 8.7 10*3/uL (ref 4.0–10.5)
nRBC: 0 % (ref 0.0–0.2)

## 2019-06-02 NOTE — ED Triage Notes (Signed)
Pt reports weakness and increased SOB for awhile. Pt reports her MD told her she had a low hbg and to come to the ED to be checked out.

## 2019-06-02 NOTE — ED Provider Notes (Signed)
[facility] Emergency Department Provider Note  Time seen: 6:46 PM  I have reviewed the triage vital signs and the nursing notes.   HISTORY  Chief Complaint Weakness, Abnormal Lab, and Shortness of Breath   HPI [name] is a 69 y.o. female with a past medical history of anemia, CHF, ESRD on HD Tuesday/Thursday/Saturday, hypertension, hyperlipidemia, presents to the emergency department for abnormal lab.  According to the patient over the past year or so she has been feeling weak.  States she had routine lab work performed today and was told that her hemoglobin was lower and she should go to the emergency department for evaluation.   Patient denies any chest pain.  Does state shortness of breath but this has been an ongoing issue over the past for 5 months per patient.  Denies any acute worsening.  Denies any fever or cough.  No abdominal pain.  Patient has dialysis catheter to the right chest.  Has not missed any dialysis sessions.  Past Medical History:  Diagnosis Date  . Anemia   . Anxiety   . Aortic valve stenosis   . Arrhythmia    atrial fibrillation  . Arthritis    feet, legs  . B12 deficiency   . Bowel obstruction ([place])   . CHF (congestive heart failure) ([place])   . CKD (chronic kidney disease)    protein in urine  . Colostomy in place [facility])   . Diabetes mellitus without complication ([place])   . Dialysis patient [facility])    T/Th/Sa  . Diverticulitis large intestine   . Dysrhythmia   . Ectopic atrial tachycardia ([place])   . FSGS (focal segmental glomerulosclerosis)   . Gastritis   . GI bleed   . Heart murmur   . Hyperlipidemia   . Hypertension   . Hypothyroidism   . Hypothyroidism    unspecified  . MRSA (methicillin resistant Staphylococcus aureus)    at abdominal wound.  Jan 2017.  Treated.   . Neuropathy   . Neuropathy in diabetes ([place])   . Obesity   . Pedal edema   . Psoriasis   . Shortness of breath dyspnea   . Sleep apnea    CPAP  .  Vertigo     Patient Active Problem List   Diagnosis Date Noted  . Spondylosis without myelopathy or radiculopathy, lumbosacral region 11/05/2018  . DDD (degenerative disc disease), lumbosacral 11/05/2018  . Hematochezia 09/03/2018  . Hemorrhoids, internal 09/03/2018  . Proctitis 09/03/2018  . Acute respiratory failure with hypoxia ([place]) 07/12/2018  . DM type 2 with diabetic peripheral neuropathy ([place]) 11/25/2017  . CKD (chronic kidney disease) stage 5, GFR less than 15 ml/min (HCC) 10/05/2017  . 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 Acute kidney injury superimposed on chronic kidney disease ([place]) 09/01/2017  . CHF (congestive heart failure), NYHA class II, chronic, diastolic ([place]) 35/57/3220  . Paroxysmal A-fib ([place]) 08/14/2017  . Cervical myelopathy ([place]) 08/07/2017  . Onychomycosis of multiple toenails with type 2 diabetes mellitus and peripheral neuropathy ([place]) 06/23/2017  . Myofascial pain 05/27/2017  . Chronic pain of left upper extremity 05/27/2017  . Morbid obesity ([place]) 05/06/2017  . Uncontrolled type 2 diabetes mellitus with hyperglycemia, with long-term current use of insulin ([place]) 02/07/2017  . Hyperlipidemia, mixed 08/08/2016  . Hx of adenomatous colonic polyps 06/14/2016  . 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 Chronic pain syndrome 03/11/2016  . Obesity, Class II, BMI 35-39.9 03/11/2016  . Osteopenia of  multiple sites 01/26/2016  . Heart burn 12/18/2015  . Polyneuropathy 11/16/2015  . Lumbar spondylosis 09/25/2015  . Lumbar facet syndrome (Bilateral) (L>R) 09/25/2015  . Recurrent major depressive disorder, in full remission ([place]) 09/19/2015  . Diabetic neuropathy ([place]) 09/06/2015  . H/O colostomy 07/12/2015  . Chronic abdominal pain (Third area of Pain) (Bilateral) (L>R) 07/03/2015  . Long term current use of opiate analgesic 07/03/2015  . Long term prescription opiate use 07/03/2015  . Opiate use (22.5 MME/Day)  07/03/2015  . Encounter for therapeutic drug level monitoring 07/03/2015  . Encounter for pain management planning 07/03/2015  . Neurogenic pain 07/03/2015  . Chronic musculoskeletal pain 07/03/2015  . Visceral abdominal pain 07/03/2015  . Diabetic peripheral neuropathy ([place]) 07/03/2015  . Chronic low back pain (Primary Area of Pain) (Bilateral) (L>R) 07/03/2015  . Chronic lower extremity pain (Secondary area of Pain) (Bilateral) (R>L) 07/03/2015  . Chronic hip pain (Bilateral) (L>R) 07/03/2015  . Chronic knee pain (Bilateral) (L>R) 07/03/2015  . Chronic neck pain (Bilateral) (L>R) 07/03/2015  . Anemia 02/16/2015  . Arthritis 02/16/2015  . End stage renal disease ([place]) 02/16/2015  . Focal and segmental hyalinosis 02/16/2015  . Bleeding gastrointestinal 02/16/2015  . Acquired atrophy of thyroid 02/16/2015  . Psoriasis 02/16/2015  . Small bowel obstruction ([place]) 02/04/2015  . Severe aortic stenosis 10/11/2014  . Paroxysmal supraventricular tachycardia ([place]) 10/11/2014  . SOBOE (shortness of breath on exertion) 10/11/2014  . Benign essential HTN 10/04/2014  . Obesity 09/05/2014  . B12 deficiency 03/02/2014  . Diabetes mellitus type 2 in obese ([place]) 12/01/2013  . Abnormal presence of protein in urine 10/15/2010    Past Surgical History:  Procedure Laterality Date  . ABDOMINAL HYSTERECTOMY    . APPLICATION OF WOUND VAC N/A 02/24/2015   Procedure: APPLICATION OF WOUND VAC;  Surgeon: Clayburn Pert, MD;  Location: [facility];  Service: General;  Laterality: N/A;  . APPLICATION OF WOUND VAC N/A 02/26/2015   Procedure: APPLICATION OF WOUND VAC;  Surgeon: Clayburn Pert, MD;  Location: [facility];  Service: General;  Laterality: N/A;  . AV FISTULA PLACEMENT Left 01/29/2019   Procedure: ARTERIOVENOUS (AV) FISTULA CREATION ( BRACHIAL CEPHALIC);  Surgeon: Katha Cabal, MD;  Location: [facility];  Service: Vascular;  Laterality: Left;  . BACK SURGERY     spur frmoved from lower back  .  CARDIAC CATHETERIZATION    . CARDIAC VALVE REPLACEMENT  08/2017   [facility]  . CARDIOVERSION N/A 07/15/2018   Procedure: CARDIOVERSION;  Surgeon: Corey Skains, MD;  Location: [facility];  Service: Cardiovascular;  Laterality: N/A;  . CATARACT EXTRACTION W/PHACO Right 11/16/2018   Procedure: CATARACT EXTRACTION PHACO AND INTRAOCULAR LENS PLACEMENT (IOC) RIGHT DIABETiC  01:18.3  13.5%  10.82;  Surgeon: Eulogio Bear, MD;  Location: [place];  Service: Ophthalmology;  Laterality: Right;  Diabetic - insulin sleep apnea  . CATARACT EXTRACTION W/PHACO Left 12/07/2018   Procedure: CATARACT EXTRACTION PHACO AND INTRAOCULAR LENS PLACEMENT (IOC) LEFT DIABETIC 01:02.6  8.8%  5.77;  Surgeon: Eulogio Bear, MD;  Location: [place];  Service: Ophthalmology;  Laterality: Left;  Diabetic - insulin  . CHOLECYSTECTOMY    . COLECTOMY    . COLONOSCOPY WITH PROPOFOL N/A 09/04/2016   Procedure: COLONOSCOPY WITH PROPOFOL;  Surgeon: Manya Silvas, MD;  Location: [facility] ENDOSCOPY;  Service: Endoscopy;  Laterality: N/A;  . COLOSTOMY REVERSAL N/A 07/12/2015   Procedure: COLOSTOMY REVERSAL;  Surgeon: Clayburn Pert, MD;  Location: [facility];  Service: General;  Laterality: N/A;  . DEBRIDEMENT OF ABDOMINAL WALL ABSCESS N/A 02/22/2015   Procedure: DEBRIDEMENT OF ABDOMINAL WALL ABSCESS;  Surgeon: Clayburn Pert, MD;  Location: [facility];  Service: General;  Laterality: N/A;  . DIALYSIS/PERMA CATHETER INSERTION N/A 10/20/2018   Procedure: DIALYSIS/PERMA CATHETER INSERTION;  Surgeon: Katha Cabal, MD;  Location: [place] CV LAB;  Service: Cardiovascular;  Laterality: N/A;  . EXCISION MASS ABDOMINAL N/A 02/24/2015   Procedure: EXCISION MASS ABDOMINAL  / [place];  Surgeon: Clayburn Pert, MD;  Location: [facility];  Service: General;  Laterality: N/A;  . EXCISION MASS ABDOMINAL N/A 02/26/2015   Procedure: EXCISION MASS ABDOMINAL/wash out;  Surgeon: Clayburn Pert, MD;  Location: [facility];   Service: General;  Laterality: N/A;  . FLEXIBLE SIGMOIDOSCOPY  06/19/2015   Procedure: FLEXIBLE SIGMOIDOSCOPY;  Surgeon: Lucilla Lame, MD;  Location: [place];  Service: Endoscopy;;  UNABLE TO ACCESS OSTOMY SITE FOR ACCESS INTO COLON  . HERNIA REPAIR     umbilical  . LAPAROSCOPY  07/12/2015   Procedure: LAPAROSCOPY DIAGNOSTIC;  Surgeon: Clayburn Pert, MD;  Location: [facility];  Service: General;;  . LAPAROTOMY  07/12/2015   Procedure: EXPLORATORY LAPAROTOMY;  Surgeon: Clayburn Pert, MD;  Location: [facility];  Service: General;;  . LAPAROTOMY N/A 02/06/2015   Procedure: Laparotomy, reduction of incarcerated parastomal hernia, repair of parastomal hernia with mesh;  Surgeon: Sherri Rad, MD;  Location: [facility];  Service: General;  Laterality: N/A;  . laparotomy closure of cecal perforation  05/09/2013   Dr. Marina Gravel  . LYSIS OF ADHESION  07/12/2015   Procedure: LYSIS OF ADHESION;  Surgeon: Clayburn Pert, MD;  Location: [facility];  Service: General;;  . TONSILLECTOMY    . WOUND DEBRIDEMENT N/A 03/03/2015   Procedure: DEBRIDEMENT ABDOMINAL WOUND;  Surgeon: Florene Glen, MD;  Location: [facility];  Service: General;  Laterality: N/A;  . WOUND DEBRIDEMENT N/A 03/09/2015   Procedure: DEBRIDEMENT ABDOMINAL WOUND;  Surgeon: Florene Glen, MD;  Location: [facility];  Service: General;  Laterality: N/A;    Prior to Admission medications   Medication Sig Start Date End Date Taking? Authorizing Provider  amiodarone (PACERONE) 200 MG tablet Take 200 mg by mouth daily.  06/29/18   [provider]  amLODipine (NORVASC) 5 MG tablet Take 1 tablet (5 mg total) by mouth daily. 07/22/15   Florene Glen, MD  apixaban (ELIQUIS) 5 MG TABS tablet Take 5 mg by mouth 2 (two) times daily.    [provider]  atorvastatin (LIPITOR) 40 MG tablet Take 40 mg by mouth daily.  04/19/15   [provider]  azelastine (ASTELIN) 0.1 % nasal spray Place 1 spray into both nostrils 2 (two) times  daily. 03/28/19   [provider]  benzonatate (TESSALON) 200 MG capsule TAKE 1 CAPSULE BY MOUTH THREE TIMES A DAY AS NEEDED FOR COUGH FOR UP TO 7 DAYS 03/03/19   [provider]  betamethasone valerate (VALISONE) 0.1 % cream Apply 1 application topically 2 (two) times daily.     [provider]  DULoxetine (CYMBALTA) 30 MG capsule Take 30 mg by mouth daily.  01/18/15   [provider]  DUPIXENT 300 MG/2ML SOSY Inject 300 mg into the skin every 14 (fourteen) days. 05/28/18   [provider]  glucose blood (ONE TOUCH ULTRA TEST) test strip TEST TWICE DAILY 09/06/14   [provider]  HUMALOG KWIKPEN 100 UNIT/ML KwikPen 6 units subcutaneous injection prior to meals Patient taking differently: Inject 22-38  Units into the skin See admin instructions. Inject 22 units in the morning & 38 unit at supper 07/15/18   Loletha Grayer, MD  hydrocortisone (ANUSOL-HC) 25 MG suppository Place 25 mg rectally 2 (two) times daily as needed for hemorrhoids. 06/19/18   [provider]  insulin lispro (HUMALOG KWIKPEN) 100 UNIT/ML KwikPen Inject 22 units before breakfast, 10 units before lunch, and 38 units before supper 04/01/19   [provider]  LANTUS SOLOSTAR 100 UNIT/ML Solostar Pen Inject 25 Units into the skin at bedtime. Patient taking differently: Inject 45 Units into the skin at bedtime.  07/15/18   Loletha Grayer, MD  levothyroxine (SYNTHROID, LEVOTHROID) 88 MCG tablet Take 88 mcg by mouth daily before breakfast.  09/11/15   [provider]  metoprolol succinate (TOPROL-XL) 50 MG 24 hr tablet Take 25 mg by mouth 2 (two) times daily.  06/24/18   [provider]  ONETOUCH DELICA LANCETS 03T MISC TEST TWICE DAILY 09/06/14   [provider]  oxyCODONE (OXY IR/ROXICODONE) 5 MG immediate release tablet Take 1 tablet (5 mg total) by mouth every 8 (eight) hours as needed for severe pain. Must last 30 days 04/04/19 05/04/19  Milinda Pointer, MD  oxyCODONE (OXY IR/ROXICODONE) 5 MG immediate release tablet Take 1 tablet (5 mg total) by mouth every 8 (eight) hours as needed for severe pain. Must last 30 days 05/04/19 06/03/19  Milinda Pointer, MD  oxyCODONE (OXY IR/ROXICODONE) 5 MG immediate release tablet Take 1 tablet (5 mg total) by mouth every 8 (eight) hours as needed for severe pain. Must last 30 days 06/03/19 07/03/19  Milinda Pointer, MD  telmisartan (MICARDIS) 20 MG tablet Take 20 mg by mouth daily.    [provider]  torsemide (DEMADEX) 20 MG tablet Take one tab po daily (can take another pill if short of breath or gains 3 lbs in one day or 5lbs in one week) Patient taking differently: Take 40 mg by mouth daily.  07/15/18   Loletha Grayer, MD  Vitamin D, Ergocalciferol, (DRISDOL) 1.25 MG (50000 UT) CAPS capsule Take 50,000 Units by mouth every Tuesday.    [provider]    Allergies  Allergen Reactions  . Buspirone Other (See Comments)    Weakness  . Lisinopril Cough  . Metformin Diarrhea    At 1000 mg dose  . Pravastatin Other (See Comments)    insomnia  . Sitagliptin Other (See Comments)    constipation  . Tramadol Itching  . Diltiazem Hcl Palpitations  . Gabapentin Palpitations  . Hydralazine Rash  . Lovastatin Palpitations    Family History  Problem Relation Age of Onset  . Diabetes Mother   . Hypertension Father   . CAD Father   . Heart attack Father   . Colon cancer Brother     Social History Social History   Tobacco Use  . Smoking status: Former Smoker    Types: Cigarettes    Quit date: 04/18/2013    Years since quitting: 6.1  . Smokeless tobacco: Never Used  Substance Use Topics  . Alcohol use: No    Alcohol/week: 0.0 standard drinks  . Drug use: No    Review of Systems Constitutional: Negative for fever.  Positive for generalized fatigue x1 year. Cardiovascular: Negative for chest pain. Respiratory: Mild shortness of breath times for 5  months. Gastrointestinal: Negative for abdominal pain Genitourinary: Negative for urinary compaints Musculoskeletal: Negative for musculoskeletal complaints Skin: Negative for skin complaints  Neurological: Negative for headache All  other ROS negative  ____________________________________________   PHYSICAL EXAM:  VITAL SIGNS: ED Triage Vitals  Enc Vitals Group     BP 06/02/19 1702 (!) 156/70     Pulse Rate 06/02/19 1702 (!) 126     Resp 06/02/19 1702 19     Temp 06/02/19 1702 98.2 F (36.8 C)     Temp Source 06/02/19 1702 Oral     SpO2 06/02/19 1702 99 %     Weight 06/02/19 1647 230 lb (104.3 kg)     Height 06/02/19 1647 5\' 3"  (1.6 m)     Head Circumference --      Peak Flow --      Pain Score 06/02/19 1647 0     Pain Loc --      Pain Edu? --      Excl. in [place]? --     Constitutional: Alert and oriented. Well appearing and in no distress. Eyes: Normal exam ENT      Head: Normocephalic and atraumatic.      Mouth/Throat: Mucous membranes are moist. Cardiovascular: Normal rate, regular rhythm. No murmurs, rubs, or gallops. Respiratory: Normal respiratory effort without tachypnea nor retractions. Breath sounds are clear.  Dialysis catheter present right chest. Gastrointestinal: Soft and nontender. No distention.   Musculoskeletal: Nontender with normal range of motion in all extremities. Neurologic:  Normal speech and language. No gross focal neurologic deficits Skin:  Skin is warm, dry and intact.  Psychiatric: Mood and affect are normal.   ____________________________________________    EKG  EKG viewed and interpreted by myself shows a normal sinus rhythm at 69 bpm with a narrow QRS, normal axis, normal intervals, nonspecific ST changes.  ____________________________________________    RADIOLOGY  Chest x-ray does show minimal edema ____________________________________________   INITIAL IMPRESSION / ASSESSMENT AND PLAN / ED COURSE  Pertinent labs & imaging  results that were available during my care of the patient were reviewed by me and considered in my medical decision making (see chart for details).   Patient presents emergency department for 1 year fatigue/weakness told that her hemoglobin was low as well as 4 months of intermittent shortness of breath.  Overall the patient appears well, reassuring vitals besides mild tachycardia on initial vitals however normal heart rate on EKG during my examination.  Patient's rectal exam shows light brown stool that is guaiac negative.  Patient's hemoglobin is slightly lower at 11.1 however this is only about a 1 point drop from recent levels.  Rectal exam is guaiac negative.  Patient is on dialysis I highly suspect the patient's anemia is due to chronic renal disease.  Patient does have clear lung sounds bilaterally however given her complaint of 4 months of shortness of breath we will obtain a chest x-ray to further evaluate.  Patient has a 99% room air saturation.  Chest x-ray does show minimal edema.  Patient has scheduled dialysis tomorrow morning.  Continues to have a room air saturation of 99%.  patient will be discharged home.  Patient agreeable plan of care.  HYDEIA MCATEE was evaluated in Emergency Department on 06/02/2019 for the symptoms described in the history of present illness. She was evaluated in the context of the global COVID-19 pandemic, which necessitated consideration that the patient might be at risk for infection with the SARS-CoV-2 virus that causes COVID-19. Institutional protocols and algorithms that pertain to the evaluation of patients at risk for COVID-19 are in a state of rapid change based on information released by regulatory bodies  including the CDC and federal and state organizations. These policies and algorithms were followed during the patient's care in the ED.  ____________________________________________   FINAL CLINICAL IMPRESSION(S) / ED DIAGNOSES  Dyspnea Weakness    Harvest Dark, MD 06/02/19 1940

## 2019-06-10 ENCOUNTER — Other Ambulatory Visit: Payer: Self-pay | Admitting: Internal Medicine

## 2019-06-10 ENCOUNTER — Other Ambulatory Visit: Payer: Self-pay

## 2019-06-10 ENCOUNTER — Emergency Department: Payer: Medicare Other

## 2019-06-10 ENCOUNTER — Inpatient Hospital Stay
Admission: EM | Admit: 2019-06-10 | Discharge: 2019-06-14 | DRG: 291 | Disposition: A | Discharge status: Home Health | Payer: Medicare Other | Attending: Student | Admitting: Student

## 2019-06-10 ENCOUNTER — Encounter: Payer: Self-pay | Admitting: Emergency Medicine

## 2019-06-10 DIAGNOSIS — I5023 Acute on chronic systolic (congestive) heart failure: Secondary | ICD-10-CM | POA: Diagnosis present

## 2019-06-10 DIAGNOSIS — Z79890 Hormone replacement therapy (postmenopausal): Secondary | ICD-10-CM

## 2019-06-10 DIAGNOSIS — J9601 Acute respiratory failure with hypoxia: Secondary | ICD-10-CM | POA: Diagnosis not present

## 2019-06-10 DIAGNOSIS — Z933 Colostomy status: Secondary | ICD-10-CM

## 2019-06-10 DIAGNOSIS — I132 Hypertensive heart and chronic kidney disease with heart failure and with stage 5 chronic kidney disease, or end stage renal disease: Principal | ICD-10-CM | POA: Diagnosis present

## 2019-06-10 DIAGNOSIS — I4891 Unspecified atrial fibrillation: Secondary | ICD-10-CM | POA: Diagnosis not present

## 2019-06-10 DIAGNOSIS — I272 Pulmonary hypertension, unspecified: Secondary | ICD-10-CM | POA: Diagnosis present

## 2019-06-10 DIAGNOSIS — M5137 Other intervertebral disc degeneration, lumbosacral region: Secondary | ICD-10-CM | POA: Diagnosis present

## 2019-06-10 DIAGNOSIS — N269 Renal sclerosis, unspecified: Secondary | ICD-10-CM | POA: Diagnosis present

## 2019-06-10 DIAGNOSIS — N186 End stage renal disease: Secondary | ICD-10-CM

## 2019-06-10 DIAGNOSIS — E782 Mixed hyperlipidemia: Secondary | ICD-10-CM | POA: Diagnosis present

## 2019-06-10 DIAGNOSIS — I480 Paroxysmal atrial fibrillation: Secondary | ICD-10-CM | POA: Diagnosis present

## 2019-06-10 DIAGNOSIS — R0902 Hypoxemia: Secondary | ICD-10-CM | POA: Diagnosis present

## 2019-06-10 DIAGNOSIS — R9389 Abnormal findings on diagnostic imaging of other specified body structures: Secondary | ICD-10-CM

## 2019-06-10 DIAGNOSIS — Z8249 Family history of ischemic heart disease and other diseases of the circulatory system: Secondary | ICD-10-CM

## 2019-06-10 DIAGNOSIS — I10 Essential (primary) hypertension: Secondary | ICD-10-CM | POA: Diagnosis present

## 2019-06-10 DIAGNOSIS — R0609 Other forms of dyspnea: Secondary | ICD-10-CM

## 2019-06-10 DIAGNOSIS — Z6841 Body Mass Index (BMI) 40.0 and over, adult: Secondary | ICD-10-CM

## 2019-06-10 DIAGNOSIS — G894 Chronic pain syndrome: Secondary | ICD-10-CM | POA: Diagnosis present

## 2019-06-10 DIAGNOSIS — Z87891 Personal history of nicotine dependence: Secondary | ICD-10-CM

## 2019-06-10 DIAGNOSIS — Z20822 Contact with and (suspected) exposure to covid-19: Secondary | ICD-10-CM | POA: Diagnosis present

## 2019-06-10 DIAGNOSIS — I081 Rheumatic disorders of both mitral and tricuspid valves: Secondary | ICD-10-CM | POA: Diagnosis present

## 2019-06-10 DIAGNOSIS — E119 Type 2 diabetes mellitus without complications: Secondary | ICD-10-CM

## 2019-06-10 DIAGNOSIS — Z7901 Long term (current) use of anticoagulants: Secondary | ICD-10-CM

## 2019-06-10 DIAGNOSIS — Z952 Presence of prosthetic heart valve: Secondary | ICD-10-CM

## 2019-06-10 DIAGNOSIS — E538 Deficiency of other specified B group vitamins: Secondary | ICD-10-CM | POA: Diagnosis present

## 2019-06-10 DIAGNOSIS — E1142 Type 2 diabetes mellitus with diabetic polyneuropathy: Secondary | ICD-10-CM | POA: Diagnosis present

## 2019-06-10 DIAGNOSIS — R0600 Dyspnea, unspecified: Secondary | ICD-10-CM | POA: Diagnosis not present

## 2019-06-10 DIAGNOSIS — N2581 Secondary hyperparathyroidism of renal origin: Secondary | ICD-10-CM | POA: Diagnosis present

## 2019-06-10 DIAGNOSIS — Z992 Dependence on renal dialysis: Secondary | ICD-10-CM

## 2019-06-10 DIAGNOSIS — I959 Hypotension, unspecified: Secondary | ICD-10-CM | POA: Diagnosis present

## 2019-06-10 DIAGNOSIS — E1122 Type 2 diabetes mellitus with diabetic chronic kidney disease: Secondary | ICD-10-CM | POA: Diagnosis present

## 2019-06-10 DIAGNOSIS — Z833 Family history of diabetes mellitus: Secondary | ICD-10-CM

## 2019-06-10 DIAGNOSIS — Z800 Family history of malignant neoplasm of digestive organs: Secondary | ICD-10-CM

## 2019-06-10 DIAGNOSIS — M1990 Unspecified osteoarthritis, unspecified site: Secondary | ICD-10-CM | POA: Diagnosis present

## 2019-06-10 DIAGNOSIS — Z794 Long term (current) use of insulin: Secondary | ICD-10-CM

## 2019-06-10 DIAGNOSIS — E039 Hypothyroidism, unspecified: Secondary | ICD-10-CM | POA: Diagnosis present

## 2019-06-10 DIAGNOSIS — I2510 Atherosclerotic heart disease of native coronary artery without angina pectoris: Secondary | ICD-10-CM | POA: Diagnosis present

## 2019-06-10 DIAGNOSIS — E785 Hyperlipidemia, unspecified: Secondary | ICD-10-CM | POA: Diagnosis present

## 2019-06-10 DIAGNOSIS — L409 Psoriasis, unspecified: Secondary | ICD-10-CM | POA: Diagnosis present

## 2019-06-10 DIAGNOSIS — I447 Left bundle-branch block, unspecified: Secondary | ICD-10-CM | POA: Diagnosis present

## 2019-06-10 DIAGNOSIS — Z79899 Other long term (current) drug therapy: Secondary | ICD-10-CM

## 2019-06-10 DIAGNOSIS — E1165 Type 2 diabetes mellitus with hyperglycemia: Secondary | ICD-10-CM | POA: Diagnosis not present

## 2019-06-10 DIAGNOSIS — D631 Anemia in chronic kidney disease: Secondary | ICD-10-CM | POA: Diagnosis present

## 2019-06-10 LAB — RESPIRATORY PANEL BY RT PCR (FLU A&B, COVID)
Influenza A by PCR: NEGATIVE
Influenza B by PCR: NEGATIVE
SARS Coronavirus 2 by RT PCR: NEGATIVE

## 2019-06-10 LAB — BASIC METABOLIC PANEL
Anion gap: 10 (ref 5–15)
BUN: 26 mg/dL — ABNORMAL HIGH (ref 8–23)
CO2: 31 mmol/L (ref 22–32)
Calcium: 8.4 mg/dL — ABNORMAL LOW (ref 8.9–10.3)
Chloride: 99 mmol/L (ref 98–111)
Creatinine, Ser: 2.99 mg/dL — ABNORMAL HIGH (ref 0.44–1.00)
GFR calc Af Amer: 18 mL/min — ABNORMAL LOW (ref >60)
GFR calc non Af Amer: 15 mL/min — ABNORMAL LOW (ref >60)
Glucose, Bld: 201 mg/dL — ABNORMAL HIGH (ref 70–99)
Potassium: 3.9 mmol/L (ref 3.5–5.1)
Sodium: 140 mmol/L (ref 135–145)

## 2019-06-10 LAB — CBC
HCT: 36.5 % (ref 36.0–46.0)
Hemoglobin: 11 g/dL — ABNORMAL LOW (ref 12.0–15.0)
MCH: 28.6 pg (ref 26.0–34.0)
MCHC: 30.1 g/dL (ref 30.0–36.0)
MCV: 94.8 fL (ref 80.0–100.0)
Platelets: 218 10*3/uL (ref 150–400)
RBC: 3.85 MIL/uL — ABNORMAL LOW (ref 3.87–5.11)
RDW: 15.5 % (ref 11.5–15.5)
WBC: 7.7 10*3/uL (ref 4.0–10.5)
nRBC: 0 % (ref 0.0–0.2)

## 2019-06-10 LAB — TSH: TSH: 3.091 u[IU]/mL (ref 0.350–4.500)

## 2019-06-10 LAB — TROPONIN I (HIGH SENSITIVITY)
Troponin I (High Sensitivity): 40 ng/L — ABNORMAL HIGH (ref <18)
Troponin I (High Sensitivity): 41 ng/L — ABNORMAL HIGH (ref <18)

## 2019-06-10 MED ORDER — AMIODARONE HCL 200 MG PO TABS: 200.0000 mg | ORAL_TABLET | Freq: Every day | ORAL | Status: DC

## 2019-06-10 MED ORDER — VITAMIN D (ERGOCALCIFEROL) 1.25 MG (50000 UNIT) PO CAPS: 50000.0000 [IU] | ORAL_CAPSULE | ORAL | Status: DC

## 2019-06-10 MED ORDER — ACETAMINOPHEN 325 MG PO TABS: 650.0000 mg | ORAL_TABLET | Freq: Four times a day (QID) | ORAL | Status: DC | PRN

## 2019-06-10 MED ORDER — INSULIN LISPRO (1 UNIT DIAL) 100 UNIT/ML (KWIKPEN): 22.0000 [IU] | PEN_INJECTOR | SUBCUTANEOUS | Status: DC

## 2019-06-10 MED ORDER — INSULIN ASPART 100 UNIT/ML ~~LOC~~ SOLN
6.0000 [IU] | Freq: Three times a day (TID) | SUBCUTANEOUS | Status: DC
  Administered 2019-06-11 – 2019-06-14 (×10): 6 [IU] via SUBCUTANEOUS
  Filled 2019-06-10 (×10): qty 1

## 2019-06-10 MED ORDER — DUPILUMAB 300 MG/2ML ~~LOC~~ SOSY: 300.0000 mg | PREFILLED_SYRINGE | SUBCUTANEOUS | Status: DC

## 2019-06-10 MED ORDER — ATORVASTATIN CALCIUM 20 MG PO TABS
40.0000 mg | ORAL_TABLET | Freq: Every day | ORAL | Status: DC
  Administered 2019-06-11 – 2019-06-13 (×3): 40 mg via ORAL
  Filled 2019-06-10 (×4): qty 2

## 2019-06-10 MED ORDER — AZELASTINE HCL 0.1 % NA SOLN
1.0000 | Freq: Two times a day (BID) | NASAL | Status: DC | PRN
  Filled 2019-06-10: qty 30

## 2019-06-10 MED ORDER — IRBESARTAN 75 MG PO TABS: 75.0000 mg | ORAL_TABLET | Freq: Every day | ORAL | Status: DC

## 2019-06-10 MED ORDER — ONDANSETRON HCL 4 MG/2ML IJ SOLN: 4.0000 mg | Freq: Four times a day (QID) | INTRAMUSCULAR | Status: DC | PRN

## 2019-06-10 MED ORDER — BENZONATATE 100 MG PO CAPS
200.0000 mg | ORAL_CAPSULE | ORAL | Status: DC | PRN
  Administered 2019-06-12: 200 mg via ORAL
  Filled 2019-06-10: qty 2

## 2019-06-10 MED ORDER — FUROSEMIDE 10 MG/ML IJ SOLN
80.0000 mg | Freq: Once | INTRAMUSCULAR | Status: AC
  Administered 2019-06-10: 21:00:00 80 mg via INTRAVENOUS
  Filled 2019-06-10: qty 8

## 2019-06-10 MED ORDER — DULOXETINE HCL 30 MG PO CPEP
30.0000 mg | ORAL_CAPSULE | Freq: Every day | ORAL | Status: DC
  Administered 2019-06-11 – 2019-06-14 (×4): 30 mg via ORAL
  Filled 2019-06-10 (×5): qty 1

## 2019-06-10 MED ORDER — SODIUM CHLORIDE 0.9 % IV SOLN: 250.0000 mL | INTRAVENOUS | Status: DC | PRN

## 2019-06-10 MED ORDER — SODIUM CHLORIDE 0.9% FLUSH: 3.0000 mL | INTRAVENOUS | Status: DC | PRN

## 2019-06-10 MED ORDER — OXYCODONE HCL 5 MG PO TABS
5.0000 mg | ORAL_TABLET | Freq: Three times a day (TID) | ORAL | Status: DC | PRN
  Administered 2019-06-11 – 2019-06-13 (×4): 5 mg via ORAL
  Filled 2019-06-10 (×4): qty 1

## 2019-06-10 MED ORDER — METOPROLOL SUCCINATE ER 25 MG PO TB24
25.0000 mg | ORAL_TABLET | Freq: Two times a day (BID) | ORAL | Status: DC
  Administered 2019-06-11 – 2019-06-13 (×4): 25 mg via ORAL
  Filled 2019-06-10 (×6): qty 1

## 2019-06-10 MED ORDER — ACETAMINOPHEN 650 MG RE SUPP: 650.0000 mg | Freq: Four times a day (QID) | RECTAL | Status: DC | PRN

## 2019-06-10 MED ORDER — LEVOTHYROXINE SODIUM 88 MCG PO TABS
88.0000 ug | ORAL_TABLET | Freq: Every day | ORAL | Status: DC
  Administered 2019-06-11 – 2019-06-14 (×4): 88 ug via ORAL
  Filled 2019-06-10 (×5): qty 1

## 2019-06-10 MED ORDER — INSULIN GLARGINE 100 UNIT/ML ~~LOC~~ SOLN
45.0000 [IU] | Freq: Every day | SUBCUTANEOUS | Status: DC
  Administered 2019-06-10 – 2019-06-13 (×4): 45 [IU] via SUBCUTANEOUS
  Filled 2019-06-10 (×5): qty 0.45

## 2019-06-10 MED ORDER — AMLODIPINE BESYLATE 5 MG PO TABS
5.0000 mg | ORAL_TABLET | Freq: Every day | ORAL | Status: DC
  Administered 2019-06-11: 09:00:00 5 mg via ORAL
  Filled 2019-06-10: qty 1

## 2019-06-10 MED ORDER — APIXABAN 5 MG PO TABS
5.0000 mg | ORAL_TABLET | Freq: Two times a day (BID) | ORAL | Status: DC
  Administered 2019-06-10 – 2019-06-11 (×2): 5 mg via ORAL
  Filled 2019-06-10 (×2): qty 1

## 2019-06-10 MED ORDER — HYDROCORTISONE ACETATE 25 MG RE SUPP
25.0000 mg | Freq: Two times a day (BID) | RECTAL | Status: DC | PRN
  Filled 2019-06-10: qty 1

## 2019-06-10 MED ORDER — AZELASTINE HCL 0.1 % NA SOLN: 1.0000 | Freq: Two times a day (BID) | NASAL | Status: DC

## 2019-06-10 MED ORDER — SODIUM CHLORIDE 0.9% FLUSH
3.0000 mL | Freq: Two times a day (BID) | INTRAVENOUS | Status: DC
  Administered 2019-06-10 – 2019-06-13 (×6): 3 mL via INTRAVENOUS

## 2019-06-10 MED ORDER — DULOXETINE HCL 30 MG PO CPEP: 30.0000 mg | ORAL_CAPSULE | Freq: Every day | ORAL | Status: DC

## 2019-06-10 MED ORDER — ONDANSETRON HCL 4 MG PO TABS: 4.0000 mg | ORAL_TABLET | Freq: Four times a day (QID) | ORAL | Status: DC | PRN

## 2019-06-10 MED ORDER — FUROSEMIDE 10 MG/ML IJ SOLN
40.0000 mg | Freq: Two times a day (BID) | INTRAMUSCULAR | Status: DC
  Administered 2019-06-11 – 2019-06-14 (×7): 40 mg via INTRAVENOUS
  Filled 2019-06-10 (×7): qty 4

## 2019-06-10 MED ORDER — AMIODARONE HCL 200 MG PO TABS
200.0000 mg | ORAL_TABLET | Freq: Every day | ORAL | Status: DC
  Administered 2019-06-11 – 2019-06-14 (×3): 200 mg via ORAL
  Filled 2019-06-10 (×3): qty 1

## 2019-06-10 MED ORDER — TRAZODONE HCL 50 MG PO TABS: 25.0000 mg | ORAL_TABLET | Freq: Every evening | ORAL | Status: DC | PRN

## 2019-06-10 NOTE — H&P (Addendum)
[facility] [place] NAME: [name]    MR#:  [id number]  DATE OF BIRTH:  February 19, 1950  DATE OF ADMISSION:  06/10/2019  PRIMARY CARE PHYSICIAN: Adin Hector, MD   REQUESTING/REFERRING PHYSICIAN: Brenton Grills, MD CHIEF COMPLAINT:   Shortness of breath  HISTORY OF PRESENT ILLNESS:  [name]  is a 69 y.o. female with a known history of end-stage renal disease on hemodialysis on Tuesday Thursday Saturday, atrial fibrillation on Eliquis and amiodarone, CHF, hypertension, hypothyroidism, type 2 diabetes mellitus, presented to the emergency room with acute onset of dyspnea which has been worsening on exertion over the last couple weeks.  She admitted to associated orthopnea, paroxysmal nocturnal dyspnea and mild lower extremity edema with occasional cough and morning wheezing.  No fever or chills.  No COVID-19 exposure.  No nausea or vomiting or abdominal pain.  She makes urine and denies any dysuria, urinary frequency urgency or flank pain.  She was noted to be hypoxemic today with pulse oximetry of 88% that went up to 99% on 2 L of O2 by nasal cannula.  Upon presentation to the emergency room, heart rate was 110 and pulse oximetry was 92% then 90% and later dropped to 88% as above.  Labs revealed potassium of 3.9 blood glucose of 201 BUN of 26 with creatinine of 2.99 compared to 34 and 4.24 on 06/02/2019.  CBC showed Hemoglobin of 11 that is stable.  COVID-19 PCR and influenza antigens came back negative.  Chest x-ray showed cardiomegaly and vascular congestion.  EKG showed atrial fibrillation with blood ventricular sponsor 112 with LVH with QRS widening and repolarization abnormality with T wave inversion laterally.  The patient was given 80 mg of IV Lasix.  She will be admitted to a progressive cardiac observation bed for further evaluation and management. PAST MEDICAL HISTORY:   Past Medical History:  Diagnosis Date  . Anemia   . Anxiety   . Aortic  valve stenosis   . Arrhythmia    atrial fibrillation  . Arthritis    feet, legs  . B12 deficiency   . Bowel obstruction ([place])   . CHF (congestive heart failure) ([place])   . CKD (chronic kidney disease)    protein in urine  . Colostomy in place [facility]   . Diabetes mellitus without complication ([place])   . Dialysis patient [facility])    T/Th/Sa  . Diverticulitis large intestine   . Dysrhythmia   . Ectopic atrial tachycardia ([place])   . FSGS (focal segmental glomerulosclerosis)   . Gastritis   . GI bleed   . Heart murmur   . Hyperlipidemia   . Hypertension   . Hypothyroidism   . Hypothyroidism    unspecified  . MRSA (methicillin resistant Staphylococcus aureus)    at abdominal wound.  [facility] 2017.  Treated.   . Neuropathy   . Neuropathy in diabetes ([place])   . Obesity   . Pedal edema   . Psoriasis   . Shortness of breath dyspnea   . Sleep apnea    CPAP  . Vertigo     PAST SURGICAL HISTORY:   Past Surgical History:  Procedure Laterality Date  . ABDOMINAL HYSTERECTOMY    . APPLICATION OF WOUND VAC N/A 02/24/2015   Procedure: APPLICATION OF WOUND VAC;  Surgeon: Clayburn Pert, MD;  Location: [facility] ORS;  Service: General;  Laterality: N/A;  . APPLICATION OF WOUND VAC N/A 02/26/2015   Procedure: APPLICATION OF WOUND VAC;  Surgeon: Clayburn Pert, MD;  Location: [facility];  Service: General;  Laterality: N/A;  . AV FISTULA PLACEMENT Left 01/29/2019   Procedure: ARTERIOVENOUS (AV) FISTULA CREATION ( BRACHIAL CEPHALIC);  Surgeon: Katha Cabal, MD;  Location: [facility];  Service: Vascular;  Laterality: Left;  . BACK SURGERY     spur frmoved from lower back  . CARDIAC CATHETERIZATION    . CARDIAC VALVE REPLACEMENT  08/2017   [facility]  . CARDIOVERSION N/A 07/15/2018   Procedure: CARDIOVERSION;  Surgeon: Corey Skains, MD;  Location: [facility];  Service: Cardiovascular;  Laterality: N/A;  . CATARACT EXTRACTION W/PHACO Right 11/16/2018   Procedure: CATARACT EXTRACTION PHACO AND INTRAOCULAR  LENS PLACEMENT (IOC) RIGHT DIABETiC  01:18.3  13.5%  10.82;  Surgeon: Eulogio Bear, MD;  Location: [place];  Service: Ophthalmology;  Laterality: Right;  Diabetic - insulin sleep apnea  . CATARACT EXTRACTION W/PHACO Left 12/07/2018   Procedure: CATARACT EXTRACTION PHACO AND INTRAOCULAR LENS PLACEMENT (IOC) LEFT DIABETIC 01:02.6  8.8%  5.77;  Surgeon: Eulogio Bear, MD;  Location: [place];  Service: Ophthalmology;  Laterality: Left;  Diabetic - insulin  . CHOLECYSTECTOMY    . COLECTOMY    . COLONOSCOPY WITH PROPOFOL N/A 09/04/2016   Procedure: COLONOSCOPY WITH PROPOFOL;  Surgeon: Manya Silvas, MD;  Location: [facility] ENDOSCOPY;  Service: Endoscopy;  Laterality: N/A;  . COLOSTOMY REVERSAL N/A 07/12/2015   Procedure: COLOSTOMY REVERSAL;  Surgeon: Clayburn Pert, MD;  Location: [facility];  Service: General;  Laterality: N/A;  . DEBRIDEMENT OF ABDOMINAL WALL ABSCESS N/A 02/22/2015   Procedure: DEBRIDEMENT OF ABDOMINAL WALL ABSCESS;  Surgeon: Clayburn Pert, MD;  Location: [facility];  Service: General;  Laterality: N/A;  . DIALYSIS/PERMA CATHETER INSERTION N/A 10/20/2018   Procedure: DIALYSIS/PERMA CATHETER INSERTION;  Surgeon: Katha Cabal, MD;  Location: [place] CV [facility];  Service: Cardiovascular;  Laterality: N/A;  . EXCISION MASS ABDOMINAL N/A 02/24/2015   Procedure: EXCISION MASS ABDOMINAL  / [place];  Surgeon: Clayburn Pert, MD;  Location: [facility];  Service: General;  Laterality: N/A;  . EXCISION MASS ABDOMINAL N/A 02/26/2015   Procedure: EXCISION MASS ABDOMINAL/wash out;  Surgeon: Clayburn Pert, MD;  Location: [facility];  Service: General;  Laterality: N/A;  . FLEXIBLE SIGMOIDOSCOPY  06/19/2015   Procedure: FLEXIBLE SIGMOIDOSCOPY;  Surgeon: Lucilla Lame, MD;  Location: [place];  Service: Endoscopy;;  UNABLE TO ACCESS OSTOMY SITE FOR ACCESS INTO COLON  . HERNIA REPAIR     umbilical  . LAPAROSCOPY  07/12/2015   Procedure: LAPAROSCOPY DIAGNOSTIC;   Surgeon: Clayburn Pert, MD;  Location: [facility];  Service: General;;  . LAPAROTOMY  07/12/2015   Procedure: EXPLORATORY LAPAROTOMY;  Surgeon: Clayburn Pert, MD;  Location: [facility];  Service: General;;  . LAPAROTOMY N/A 02/06/2015   Procedure: Laparotomy, reduction of incarcerated parastomal hernia, repair of parastomal hernia with mesh;  Surgeon: Sherri Rad, MD;  Location: [facility];  Service: General;  Laterality: N/A;  . laparotomy closure of cecal perforation  05/09/2013   Dr. Marina Gravel  . LYSIS OF ADHESION  07/12/2015   Procedure: LYSIS OF ADHESION;  Surgeon: Clayburn Pert, MD;  Location: [facility];  Service: General;;  . TONSILLECTOMY    . WOUND DEBRIDEMENT N/A 03/03/2015   Procedure: DEBRIDEMENT ABDOMINAL WOUND;  Surgeon: Florene Glen, MD;  Location: [facility];  Service: General;  Laterality: N/A;  . WOUND DEBRIDEMENT N/A 03/09/2015   Procedure: DEBRIDEMENT ABDOMINAL WOUND;  Surgeon: Florene Glen, MD;  Location:  [facility];  Service: General;  Laterality: N/A;    SOCIAL HISTORY:   Social History   Tobacco Use  . Smoking status: Former Smoker    Types: Cigarettes    Quit date: 04/18/2013    Years since quitting: 6.1  . Smokeless tobacco: Never Used  Substance Use Topics  . Alcohol use: No    Alcohol/week: 0.0 standard drinks    FAMILY HISTORY:   Family History  Problem Relation Age of Onset  . Diabetes Mother   . Hypertension Father   . CAD Father   . Heart attack Father   . Colon cancer Brother     DRUG ALLERGIES:   Allergies  Allergen Reactions  . Buspirone Other (See Comments)    Weakness  . Lisinopril Cough  . Metformin Diarrhea    At 1000 mg dose  . Pravastatin Other (See Comments)    insomnia  . Sitagliptin Other (See Comments)    constipation  . Tramadol Itching  . Diltiazem Hcl Palpitations  . Gabapentin Palpitations  . Hydralazine Rash  . Lovastatin Palpitations    REVIEW OF SYSTEMS:   ROS As per history of present illness. All pertinent  systems were reviewed above. Constitutional,  HEENT, cardiovascular, respiratory, GI, GU, musculoskeletal, neuro, psychiatric, endocrine,  integumentary and hematologic systems were reviewed and are otherwise  negative/unremarkable except for positive findings mentioned above in the HPI.   MEDICATIONS AT HOME:   Prior to Admission medications   Medication Sig Start Date End Date Taking? Authorizing Provider  amiodarone (PACERONE) 200 MG tablet Take 200 mg by mouth daily.  06/29/18   [provider]  amLODipine (NORVASC) 5 MG tablet Take 1 tablet (5 mg total) by mouth daily. 07/22/15   Florene Glen, MD  apixaban (ELIQUIS) 5 MG TABS tablet Take 5 mg by mouth 2 (two) times daily.    [provider]  atorvastatin (LIPITOR) 40 MG tablet Take 40 mg by mouth daily.  04/19/15   [provider]  azelastine (ASTELIN) 0.1 % nasal spray Place 1 spray into both nostrils 2 (two) times daily. 03/28/19   [provider]  benzonatate (TESSALON) 200 MG capsule TAKE 1 CAPSULE BY MOUTH THREE TIMES A DAY AS NEEDED FOR COUGH FOR UP TO 7 DAYS 03/03/19   [provider]  betamethasone valerate (VALISONE) 0.1 % cream Apply 1 application topically 2 (two) times daily.     [provider]  DULoxetine (CYMBALTA) 30 MG capsule Take 30 mg by mouth daily.  01/18/15   [provider]  DUPIXENT 300 MG/2ML SOSY Inject 300 mg into the skin every 14 (fourteen) days. 05/28/18   [provider]  glucose blood (ONE TOUCH ULTRA TEST) test strip TEST TWICE DAILY 09/06/14   [provider]  HUMALOG KWIKPEN 100 UNIT/ML KwikPen 6 units subcutaneous injection prior to meals Patient taking differently: Inject 22-38 Units into the skin See admin instructions. Inject 22 units in the morning & 38 unit at supper 07/15/18   Loletha Grayer, MD  hydrocortisone (ANUSOL-HC) 25 MG suppository Place 25 mg rectally 2 (two) times daily as needed for hemorrhoids. 06/19/18    [provider]  insulin lispro (HUMALOG KWIKPEN) 100 UNIT/ML KwikPen Inject 22 units before breakfast, 10 units before lunch, and 38 units before supper 04/01/19   [provider]  LANTUS SOLOSTAR 100 UNIT/ML Solostar Pen Inject 25 Units into the skin at bedtime. Patient taking differently: Inject 45 Units into the skin at bedtime.  07/15/18  Loletha Grayer, MD  levothyroxine (SYNTHROID, LEVOTHROID) 88 MCG tablet Take 88 mcg by mouth daily before breakfast.  09/11/15   [provider]  metoprolol succinate (TOPROL-XL) 50 MG 24 hr tablet Take 25 mg by mouth 2 (two) times daily.  06/24/18   [provider]  ONETOUCH DELICA LANCETS 57Q MISC TEST TWICE DAILY 09/06/14   [provider]  oxyCODONE (OXY IR/ROXICODONE) 5 MG immediate release tablet Take 1 tablet (5 mg total) by mouth every 8 (eight) hours as needed for severe pain. Must last 30 days 04/04/19 05/04/19  Milinda Pointer, MD  oxyCODONE (OXY IR/ROXICODONE) 5 MG immediate release tablet Take 1 tablet (5 mg total) by mouth every 8 (eight) hours as needed for severe pain. Must last 30 days 05/04/19 06/03/19  Milinda Pointer, MD  oxyCODONE (OXY IR/ROXICODONE) 5 MG immediate release tablet Take 1 tablet (5 mg total) by mouth every 8 (eight) hours as needed for severe pain. Must last 30 days 06/03/19 07/03/19  Milinda Pointer, MD  telmisartan (MICARDIS) 20 MG tablet Take 20 mg by mouth daily.    [provider]  torsemide (DEMADEX) 20 MG tablet Take one tab po daily (can take another pill if short of breath or gains 3 lbs in one day or 5lbs in one week) Patient taking differently: Take 40 mg by mouth daily.  07/15/18   Loletha Grayer, MD  Vitamin D, Ergocalciferol, (DRISDOL) 1.25 MG (50000 UT) CAPS capsule Take 50,000 Units by mouth every Tuesday.    [provider]      VITAL SIGNS:  Blood pressure 112/71, pulse 99, temperature 98.2 F (36.8 C), temperature source Oral, resp. rate 20,  height 5\' 3"  (1.6 m), weight 97.5 kg, SpO2 99 %.  PHYSICAL EXAMINATION:  Physical Exam  GENERAL:  69 y.o.-year-old female patient lying in the bed with no acute distress.  EYES: Pupils equal, round, reactive to light and accommodation. No scleral icterus. Extraocular muscles intact.  HEENT: Head atraumatic, normocephalic. Oropharynx and nasopharynx clear.  NECK:  Supple, no jugular venous distention. No thyroid enlargement, no tenderness.  LUNGS: Normal breath sounds bilaterally, no wheezing, rales,rhonchi or crepitation. No use of accessory muscles of respiration.  CARDIOVASCULAR: Irregularly irregular rhythm, S1, S2 normal. No murmurs, rubs, or gallops.  ABDOMEN: Soft, nondistended, nontender. Bowel sounds present. No organomegaly or mass.  EXTREMITIES: 1+ bilateral ankle pitting edema, with no cyanosis, or clubbing.  NEUROLOGIC: Cranial nerves II through XII are intact. Muscle strength 5/5 in all extremities. Sensation intact. Gait not checked.  PSYCHIATRIC: The patient is alert and oriented x 3.  Normal affect and good eye contact. SKIN: No obvious rash, lesion, or ulcer.   LABORATORY PANEL:   CBC Recent Labs  Lab 06/10/19 1736  WBC 7.7  HGB 11.0*  HCT 36.5  PLT 218   ------------------------------------------------------------------------------------------------------------------  Chemistries  Recent Labs  Lab 06/10/19 1736  NA 140  K 3.9  CL 99  CO2 31  GLUCOSE 201*  BUN 26*  CREATININE 2.99*  CALCIUM 8.4*   ------------------------------------------------------------------------------------------------------------------  Cardiac Enzymes No results for input(s): TROPONINI in the last 168 hours. ------------------------------------------------------------------------------------------------------------------  RADIOLOGY:  DG Chest 2 View  Result Date: 06/10/2019 CLINICAL DATA:  Chest pain EXAM: CHEST - 2 VIEW COMPARISON:  06/02/2019 FINDINGS: Right dialysis  catheter remains in place, unchanged. Cardiomegaly with vascular congestion. No overt edema confluent opacities or effusions. No acute bony abnormality. IMPRESSION: Cardiomegaly, vascular congestion. Electronically Signed   By: Rolm Baptise M.D.   On: 06/10/2019 19:07  IMPRESSION AND PLAN:   1.  Acute hypoxemic respiratory failure, likely secondary to fluid overload with end-stage renal disease on hemodialysis and mild acute on chronic systolic CHF. -Patient will be admitted to an observation progressive cardiac bed. -We will place her on IV Lasix for now mainly for pulmonary congestion to replace Demadex. . -Nephrology consultation will be obtained. -I notified Dr. Holley Raring about the patient. -We will obtain a 2D echo for further assessment of her EF.  She has a history of an EF of 25% on a 2D echo at [facility] on 06/24/2018 that has improved to 40% with moderate mitral regurgitation and tricuspid regurgitation. -A cardiology consultation will be obtained. -I notified Dr. Saralyn Pilar about the patient.  2.  Paroxysmal atrial fibrillation with rapid ventricular sponsor. -Heart rate has improved to 98. -We will continue her Eliquis, Toprol-XL and amiodarone.  3.  Hypertension. -We will continue her Norvasc, Toprol-XL and ARB.  4.  Hypothyroidism. -We will continue Synthroid and check TSH.  5.  Type 2 diabetes mellitus. -We will place on supplemental coverage with NovoLog and continue basal coverage.  6.  Dyslipidemia. -We will continue her statin therapy.  7.  DVT prophylaxis. -We will continue her Eliquis.   All the records are reviewed and case discussed with ED provider. The plan of care was discussed in details with the patient (and family). I answered all questions. The patient agreed to proceed with the above mentioned plan. Further management will depend upon hospital course.   CODE STATUS: Full code  Status is: Observation  The patient remains OBS appropriate and will d/c  before 2 midnights.  Dispo: The patient is from: Home              Anticipated d/c is to: Home              Anticipated d/c date is: 1 day              Patient currently is not medically stable to d/c.   TOTAL TIME TAKING CARE OF THIS PATIENT: 55 minutes.    Christel Mormon M.D on 06/10/2019 at 8:00 PM  [facility]   From 7 PM-7 AM, contact night-coverage [email or web address]  CC: Primary care physician; Adin Hector, MD   Note: This dictation was prepared with Dragon dictation along with smaller phrase technology. Any transcriptional errors that result from this process are unintentional.

## 2019-06-10 NOTE — ED Triage Notes (Signed)
Pt presents to ED from home, reports PCP called her to come to ED for evaluation d/t abnormal troponin level of 0.06. Pt is Tues-Thurs-Sat dialysis patient, completed full treatment today. Had seen PCP 4/20 for SOB ongoing x2 wks. Pt states she gets very short of breath with minimal exertion. RA O2 sat 92%, placed on 2L for comfort. Pt also reports pain under R shoulder blade that comes through to chest, worse with inspiration. On Eliquis.

## 2019-06-10 NOTE — ED Provider Notes (Signed)
[facility] Emergency Department Provider Note  ____________________________________________  Time seen: Approximately 7:47 PM  I have reviewed the triage vital signs and the nursing notes.   HISTORY  Chief Complaint Abnormal Lab    HPI [name] is a 69 y.o. female with a history of CHF CKD on dialysis Tuesday Thursday Saturday diabetes and A. fib who comes the ED complaining of dyspnea on exertion, worsening for the past 2 weeks.  Better with rest.  No associated chest pain, no pleuritic discomfort.  No significant cough fever chills sweats or body aches.  She notes that currently her shortness of breath is so severe that she cannot walk even a few steps without feeling out of breath.  She has been compliant with dialysis and other medications including Eliquis..      Past Medical History:  Diagnosis Date  . Anemia   . Anxiety   . Aortic valve stenosis   . Arrhythmia    atrial fibrillation  . Arthritis    feet, legs  . B12 deficiency   . Bowel obstruction ([place])   . CHF (congestive heart failure) ([place])   . CKD (chronic kidney disease)    protein in urine  . Colostomy in place [facility])   . Diabetes mellitus without complication ([place])   . Dialysis patient [facility])    T/Th/Sa  . Diverticulitis large intestine   . Dysrhythmia   . Ectopic atrial tachycardia ([place])   . FSGS (focal segmental glomerulosclerosis)   . Gastritis   . GI bleed   . Heart murmur   . Hyperlipidemia   . Hypertension   . Hypothyroidism   . Hypothyroidism    unspecified  . MRSA (methicillin resistant Staphylococcus aureus)    at abdominal wound.  Jan 2017.  Treated.   . Neuropathy   . Neuropathy in diabetes ([place])   . Obesity   . Pedal edema   . Psoriasis   . Shortness of breath dyspnea   . Sleep apnea    CPAP  . Vertigo      Patient Active Problem List   Diagnosis Date Noted  . Spondylosis without myelopathy or radiculopathy, lumbosacral region 11/05/2018  . DDD  (degenerative disc disease), lumbosacral 11/05/2018  . Hematochezia 09/03/2018  . Hemorrhoids, internal 09/03/2018  . Proctitis 09/03/2018  . Acute respiratory failure with hypoxia ([place]) 07/12/2018  . DM type 2 with diabetic peripheral neuropathy ([place]) 11/25/2017  . CKD (chronic kidney disease) stage 5, GFR less than 15 ml/min (HCC) 10/05/2017  . 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 Acute kidney injury superimposed on chronic kidney disease ([place]) 09/01/2017  . CHF (congestive heart failure), NYHA class II, chronic, diastolic ([place]) 77/41/2878  . Paroxysmal A-fib ([place]) 08/14/2017  . Cervical myelopathy ([place]) 08/07/2017  . Onychomycosis of multiple toenails with type 2 diabetes mellitus and peripheral neuropathy ([place]) 06/23/2017  . Myofascial pain 05/27/2017  . Chronic pain of left upper extremity 05/27/2017  . Morbid obesity ([place]) 05/06/2017  . Uncontrolled type 2 diabetes mellitus with hyperglycemia, with long-term current use of insulin ([place]) 02/07/2017  . Hyperlipidemia, mixed 08/08/2016  . Hx of adenomatous colonic polyps 06/14/2016  . 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 Chronic pain syndrome 03/11/2016  . Obesity, Class II, BMI 35-39.9 03/11/2016  . Osteopenia of multiple sites 01/26/2016  . Heart burn 12/18/2015  . Polyneuropathy 11/16/2015  . Lumbar spondylosis 09/25/2015  . Lumbar facet syndrome (Bilateral) (L>R) 09/25/2015  . Recurrent major depressive disorder,  in full remission ([place]) 09/19/2015  . Diabetic neuropathy ([place]) 09/06/2015  . H/O colostomy 07/12/2015  . Chronic abdominal pain (Third area of Pain) (Bilateral) (L>R) 07/03/2015  . Long term current use of opiate analgesic 07/03/2015  . Long term prescription opiate use 07/03/2015  . Opiate use (22.5 MME/Day) 07/03/2015  . Encounter for therapeutic drug level monitoring 07/03/2015  . Encounter for pain management planning 07/03/2015  . Neurogenic pain 07/03/2015  .  Chronic musculoskeletal pain 07/03/2015  . Visceral abdominal pain 07/03/2015  . Diabetic peripheral neuropathy ([place]) 07/03/2015  . Chronic low back pain (Primary Area of Pain) (Bilateral) (L>R) 07/03/2015  . Chronic lower extremity pain (Secondary area of Pain) (Bilateral) (R>L) 07/03/2015  . Chronic hip pain (Bilateral) (L>R) 07/03/2015  . Chronic knee pain (Bilateral) (L>R) 07/03/2015  . Chronic neck pain (Bilateral) (L>R) 07/03/2015  . Anemia 02/16/2015  . Arthritis 02/16/2015  . End stage renal disease ([place]) 02/16/2015  . Focal and segmental hyalinosis 02/16/2015  . Bleeding gastrointestinal 02/16/2015  . Acquired atrophy of thyroid 02/16/2015  . Psoriasis 02/16/2015  . Small bowel obstruction ([place]) 02/04/2015  . Severe aortic stenosis 10/11/2014  . Paroxysmal supraventricular tachycardia ([place]) 10/11/2014  . SOBOE (shortness of breath on exertion) 10/11/2014  . Benign essential HTN 10/04/2014  . Obesity 09/05/2014  . B12 deficiency 03/02/2014  . Diabetes mellitus type 2 in obese ([place]) 12/01/2013  . Abnormal presence of protein in urine 10/15/2010     Past Surgical History:  Procedure Laterality Date  . ABDOMINAL HYSTERECTOMY    . APPLICATION OF WOUND VAC N/A 02/24/2015   Procedure: APPLICATION OF WOUND VAC;  Surgeon: Clayburn Pert, MD;  Location: [facility];  Service: General;  Laterality: N/A;  . APPLICATION OF WOUND VAC N/A 02/26/2015   Procedure: APPLICATION OF WOUND VAC;  Surgeon: Clayburn Pert, MD;  Location: [facility];  Service: General;  Laterality: N/A;  . AV FISTULA PLACEMENT Left 01/29/2019   Procedure: ARTERIOVENOUS (AV) FISTULA CREATION ( BRACHIAL CEPHALIC);  Surgeon: Katha Cabal, MD;  Location: [facility];  Service: Vascular;  Laterality: Left;  . BACK SURGERY     spur frmoved from lower back  . CARDIAC CATHETERIZATION    . CARDIAC VALVE REPLACEMENT  08/2017   [facility]  . CARDIOVERSION N/A 07/15/2018   Procedure: CARDIOVERSION;  Surgeon: Corey Skains, MD;   Location: [facility];  Service: Cardiovascular;  Laterality: N/A;  . CATARACT EXTRACTION W/PHACO Right 11/16/2018   Procedure: CATARACT EXTRACTION PHACO AND INTRAOCULAR LENS PLACEMENT (IOC) RIGHT DIABETiC  01:18.3  13.5%  10.82;  Surgeon: Eulogio Bear, MD;  Location: [place];  Service: Ophthalmology;  Laterality: Right;  Diabetic - insulin sleep apnea  . CATARACT EXTRACTION W/PHACO Left 12/07/2018   Procedure: CATARACT EXTRACTION PHACO AND INTRAOCULAR LENS PLACEMENT (IOC) LEFT DIABETIC 01:02.6  8.8%  5.77;  Surgeon: Eulogio Bear, MD;  Location: [place];  Service: Ophthalmology;  Laterality: Left;  Diabetic - insulin  . CHOLECYSTECTOMY    . COLECTOMY    . COLONOSCOPY WITH PROPOFOL N/A 09/04/2016   Procedure: COLONOSCOPY WITH PROPOFOL;  Surgeon: Manya Silvas, MD;  Location: [facility]COPY;  Service: Endoscopy;  Laterality: N/A;  . COLOSTOMY REVERSAL N/A 07/12/2015   Procedure: COLOSTOMY REVERSAL;  Surgeon: Clayburn Pert, MD;  Location: [facility];  Service: General;  Laterality: N/A;  . DEBRIDEMENT OF ABDOMINAL WALL ABSCESS N/A 02/22/2015   Procedure: DEBRIDEMENT OF ABDOMINAL WALL ABSCESS;  Surgeon: Clayburn Pert, MD;  Location: [facility];  Service:  General;  Laterality: N/A;  . DIALYSIS/PERMA CATHETER INSERTION N/A 10/20/2018   Procedure: DIALYSIS/PERMA CATHETER INSERTION;  Surgeon: Katha Cabal, MD;  Location: [place] CV LAB;  Service: Cardiovascular;  Laterality: N/A;  . EXCISION MASS ABDOMINAL N/A 02/24/2015   Procedure: EXCISION MASS ABDOMINAL  / [place];  Surgeon: Clayburn Pert, MD;  Location: [facility];  Service: General;  Laterality: N/A;  . EXCISION MASS ABDOMINAL N/A 02/26/2015   Procedure: EXCISION MASS ABDOMINAL/wash out;  Surgeon: Clayburn Pert, MD;  Location: [facility];  Service: General;  Laterality: N/A;  . FLEXIBLE SIGMOIDOSCOPY  06/19/2015   Procedure: FLEXIBLE SIGMOIDOSCOPY;  Surgeon: Lucilla Lame, MD;  Location: [place];   Service: Endoscopy;;  UNABLE TO ACCESS OSTOMY SITE FOR ACCESS INTO COLON  . HERNIA REPAIR     umbilical  . LAPAROSCOPY  07/12/2015   Procedure: LAPAROSCOPY DIAGNOSTIC;  Surgeon: Clayburn Pert, MD;  Location: [facility];  Service: General;;  . LAPAROTOMY  07/12/2015   Procedure: EXPLORATORY LAPAROTOMY;  Surgeon: Clayburn Pert, MD;  Location: [facility];  Service: General;;  . LAPAROTOMY N/A 02/06/2015   Procedure: Laparotomy, reduction of incarcerated parastomal hernia, repair of parastomal hernia with mesh;  Surgeon: Sherri Rad, MD;  Location: [facility];  Service: General;  Laterality: N/A;  . laparotomy closure of cecal perforation  05/09/2013   Dr. [place] Gravel  . LYSIS OF ADHESION  07/12/2015   Procedure: LYSIS OF ADHESION;  Surgeon: Clayburn Pert, MD;  Location: [facility];  Service: General;;  . TONSILLECTOMY    . WOUND DEBRIDEMENT N/A 03/03/2015   Procedure: DEBRIDEMENT ABDOMINAL WOUND;  Surgeon: Florene Glen, MD;  Location: [facility];  Service: General;  Laterality: N/A;  . WOUND DEBRIDEMENT N/A 03/09/2015   Procedure: DEBRIDEMENT ABDOMINAL WOUND;  Surgeon: Florene Glen, MD;  Location: [facility];  Service: General;  Laterality: N/A;     Prior to Admission medications   Medication Sig Start Date End Date Taking? Authorizing Provider  amiodarone (PACERONE) 200 MG tablet Take 200 mg by mouth daily.  06/29/18   [provider]  amLODipine (NORVASC) 5 MG tablet Take 1 tablet (5 mg total) by mouth daily. 07/22/15   Florene Glen, MD  apixaban (ELIQUIS) 5 MG TABS tablet Take 5 mg by mouth 2 (two) times daily.    [provider]  atorvastatin (LIPITOR) 40 MG tablet Take 40 mg by mouth daily.  04/19/15   [provider]  azelastine (ASTELIN) 0.1 % nasal spray Place 1 spray into both nostrils 2 (two) times daily. 03/28/19   [provider]  benzonatate (TESSALON) 200 MG capsule TAKE 1 CAPSULE BY MOUTH THREE TIMES A DAY AS NEEDED FOR COUGH FOR UP TO 7 DAYS 03/03/19    [provider]  betamethasone valerate (VALISONE) 0.1 % cream Apply 1 application topically 2 (two) times daily.     [provider]  DULoxetine (CYMBALTA) 30 MG capsule Take 30 mg by mouth daily.  01/18/15   [provider]  DUPIXENT 300 MG/2ML SOSY Inject 300 mg into the skin every 14 (fourteen) days. 05/28/18   [provider]  glucose blood (ONE TOUCH ULTRA TEST) test strip TEST TWICE DAILY 09/06/14   [provider]  HUMALOG KWIKPEN 100 UNIT/ML KwikPen 6 units subcutaneous injection prior to meals Patient taking differently: Inject 22-38 Units into the skin See admin instructions. Inject 22 units in the morning & 38 unit at supper 07/15/18   Loletha Grayer, MD  hydrocortisone (ANUSOL-HC) 25 MG  suppository Place 25 mg rectally 2 (two) times daily as needed for hemorrhoids. 06/19/18   [provider]  insulin lispro (HUMALOG KWIKPEN) 100 UNIT/ML KwikPen Inject 22 units before breakfast, 10 units before lunch, and 38 units before supper 04/01/19   [provider]  LANTUS SOLOSTAR 100 UNIT/ML Solostar Pen Inject 25 Units into the skin at bedtime. Patient taking differently: Inject 45 Units into the skin at bedtime.  07/15/18   Loletha Grayer, MD  levothyroxine (SYNTHROID, LEVOTHROID) 88 MCG tablet Take 88 mcg by mouth daily before breakfast.  09/11/15   [provider]  metoprolol succinate (TOPROL-XL) 50 MG 24 hr tablet Take 25 mg by mouth 2 (two) times daily.  06/24/18   [provider]  ONETOUCH DELICA LANCETS 19Q MISC TEST TWICE DAILY 09/06/14   [provider]  oxyCODONE (OXY IR/ROXICODONE) 5 MG immediate release tablet Take 1 tablet (5 mg total) by mouth every 8 (eight) hours as needed for severe pain. Must last 30 days 04/04/19 05/04/19  Milinda Pointer, MD  oxyCODONE (OXY IR/ROXICODONE) 5 MG immediate release tablet Take 1 tablet (5 mg total) by mouth every 8 (eight) hours as needed for severe pain. Must last  30 days 05/04/19 06/03/19  Milinda Pointer, MD  oxyCODONE (OXY IR/ROXICODONE) 5 MG immediate release tablet Take 1 tablet (5 mg total) by mouth every 8 (eight) hours as needed for severe pain. Must last 30 days 06/03/19 07/03/19  Milinda Pointer, MD  telmisartan (MICARDIS) 20 MG tablet Take 20 mg by mouth daily.    [provider]  torsemide (DEMADEX) 20 MG tablet Take one tab po daily (can take another pill if short of breath or gains 3 lbs in one day or 5lbs in one week) Patient taking differently: Take 40 mg by mouth daily.  07/15/18   Loletha Grayer, MD  Vitamin D, Ergocalciferol, (DRISDOL) 1.25 MG (50000 UT) CAPS capsule Take 50,000 Units by mouth every Tuesday.    [provider]     Allergies Buspirone, Lisinopril, Metformin, Pravastatin, Sitagliptin, Tramadol, Diltiazem hcl, Gabapentin, Hydralazine, and Lovastatin   Family History  Problem Relation Age of Onset  . Diabetes Mother   . Hypertension Father   . CAD Father   . Heart attack Father   . Colon cancer Brother     Social History Social History   Tobacco Use  . Smoking status: Former Smoker    Types: Cigarettes    Quit date: 04/18/2013    Years since quitting: 6.1  . Smokeless tobacco: Never Used  Substance Use Topics  . Alcohol use: No    Alcohol/week: 0.0 standard drinks  . Drug use: No    Review of Systems  Constitutional:   No fever or chills.  ENT:   No sore throat. No rhinorrhea. Cardiovascular:   No chest pain or syncope. Respiratory: Positive dyspnea on exertion as above without cough. Gastrointestinal:   Negative for abdominal pain, vomiting and diarrhea.  Musculoskeletal:   Negative for focal pain or swelling All other systems reviewed and are negative except as documented above in ROS and HPI.  ____________________________________________   PHYSICAL EXAM:  VITAL SIGNS: ED Triage Vitals  Enc Vitals Group     BP 06/10/19 1720 100/72     Pulse Rate 06/10/19 1720 (!) 110      Resp 06/10/19 1720 18     Temp 06/10/19 1720 98.2 F (36.8 C)     Temp Source 06/10/19 1720 Oral     SpO2 06/10/19  1720 92 %     Weight 06/10/19 1719 215 lb (97.5 kg)     Height 06/10/19 1719 5\' 3"  (1.6 m)     Head Circumference --      Peak Flow --      Pain Score 06/10/19 1742 7     Pain Loc --      Pain Edu? --      Excl. in [place]? --     Vital signs reviewed, nursing assessments reviewed.   Constitutional:   Alert and oriented. Non-toxic appearance. Eyes:   Conjunctivae are normal. EOMI. PERRL. ENT      Head:   Normocephalic and atraumatic.      Nose:   Wearing a mask.      Mouth/Throat:   Wearing a mask.      Neck:   No meningismus. Full ROM. Hematological/Lymphatic/Immunilogical:   No cervical lymphadenopathy. Cardiovascular:   Irregularly irregular rhythm, heart rate 90-1 10. Symmetric bilateral radial and DP pulses.  No murmurs. Cap refill less than 2 seconds. Respiratory:   Normal respiratory effort without tachypnea/retractions. Breath sounds are clear and equal bilaterally. No wheezes/rales/rhonchi. Gastrointestinal:   Soft and nontender. Non distended. There is no CVA tenderness.  No rebound, rigidity, or guarding. Musculoskeletal:   Normal range of motion in all extremities. No joint effusions.  No lower extremity tenderness.  No edema. Neurologic:   Normal speech and language.  Motor grossly intact. No acute focal neurologic deficits are appreciated.  Skin:    Skin is warm, dry and intact. No rash noted.  No petechiae, purpura, or bullae.  ____________________________________________    LABS (pertinent positives/negatives) (all labs ordered are listed, but only abnormal results are displayed) Labs Reviewed  BASIC METABOLIC PANEL - Abnormal; Notable for the following components:      Result Value   Glucose, Bld 201 (*)    BUN 26 (*)    Creatinine, Ser 2.99 (*)    Calcium 8.4 (*)    GFR calc non Af Amer 15 (*)    GFR calc Af Amer 18 (*)    All other  components within normal limits  CBC - Abnormal; Notable for the following components:   RBC 3.85 (*)    Hemoglobin 11.0 (*)    All other components within normal limits  TROPONIN I (HIGH SENSITIVITY) - Abnormal; Notable for the following components:   Troponin I (High Sensitivity) 40 (*)    All other components within normal limits  RESPIRATORY PANEL BY RT PCR (FLU A&B, COVID)  TROPONIN I (HIGH SENSITIVITY)   ____________________________________________   EKG  Interpreted by me Atrial fibrillation rate of 112.  Normal axis, normal intervals.  Poor R wave progression.  Left bundle branch block.  No acute ischemic changes.  ____________________________________________    [id number]  DG Chest 2 View  Result Date: 06/10/2019 CLINICAL DATA:  Chest pain EXAM: CHEST - 2 VIEW COMPARISON:  06/02/2019 FINDINGS: Right dialysis catheter remains in place, unchanged. Cardiomegaly with vascular congestion. No overt edema confluent opacities or effusions. No acute bony abnormality. IMPRESSION: Cardiomegaly, vascular congestion. Electronically Signed   By: Rolm Baptise M.D.   On: 06/10/2019 19:07    ____________________________________________   PROCEDURES Procedures  ____________________________________________    CLINICAL IMPRESSION / ASSESSMENT AND PLAN / ED COURSE  Medications ordered in the ED: Medications - No data to display  Pertinent labs & imaging results that were available during my care of the patient were reviewed by me and considered in my  medical decision making (see chart for details).  [name] was evaluated in Emergency Department on 06/10/2019 for the symptoms described in the history of present illness. She was evaluated in the context of the global COVID-19 pandemic, which necessitated consideration that the patient might be at risk for infection with the SARS-CoV-2 virus that causes COVID-19. Institutional protocols and algorithms that pertain to the  evaluation of patients at risk for COVID-19 are in a state of rapid change based on information released by regulatory bodies including the CDC and federal and state organizations. These policies and algorithms were followed during the patient's care in the ED.     Clinical Course as of Jun 09 1945  Thu Jun 10, 2019  1917 Pt p/w DOE x 2 weeks, worsening. Doubt ACS, PE, dissection, carditis. Suspect volume overload with ESRD. Hypoxic to 88% on RA at rest. Will check Covid, plan to admit. Currently stabilized on 2L [place].    [PS]    Clinical Course User Index [PS] Carrie Mew, MD    ----------------------------------------- 7:55 PM on 06/10/2019 -----------------------------------------  Covid negative.  Case discussed with the hospitalist.  We will do a trial of IV Lasix and she is on torsemide at home.   ____________________________________________   FINAL CLINICAL IMPRESSION(S) / ED DIAGNOSES    Final diagnoses:  Acute respiratory failure with hypoxia (HCC)  DOE (dyspnea on exertion)  ESRD on hemodialysis (HCC)  Atrial fibrillation, unspecified type ([place])  Type 2 diabetes mellitus without complication, with long-term current use of insulin [facility])     ED Discharge Orders    None      Portions of this note were generated with dragon dictation software. Dictation errors may occur despite best attempts at proofreading.   Carrie Mew, MD 06/10/19 [place]

## 2019-06-11 ENCOUNTER — Observation Stay: Payer: Medicare Other

## 2019-06-11 DIAGNOSIS — Z833 Family history of diabetes mellitus: Secondary | ICD-10-CM | POA: Diagnosis not present

## 2019-06-11 DIAGNOSIS — I350 Nonrheumatic aortic (valve) stenosis: Secondary | ICD-10-CM | POA: Diagnosis not present

## 2019-06-11 DIAGNOSIS — E039 Hypothyroidism, unspecified: Secondary | ICD-10-CM | POA: Diagnosis present

## 2019-06-11 DIAGNOSIS — R0902 Hypoxemia: Secondary | ICD-10-CM | POA: Diagnosis not present

## 2019-06-11 DIAGNOSIS — N2581 Secondary hyperparathyroidism of renal origin: Secondary | ICD-10-CM | POA: Diagnosis present

## 2019-06-11 DIAGNOSIS — M1990 Unspecified osteoarthritis, unspecified site: Secondary | ICD-10-CM | POA: Diagnosis present

## 2019-06-11 DIAGNOSIS — I480 Paroxysmal atrial fibrillation: Secondary | ICD-10-CM | POA: Diagnosis present

## 2019-06-11 DIAGNOSIS — Z992 Dependence on renal dialysis: Secondary | ICD-10-CM | POA: Diagnosis not present

## 2019-06-11 DIAGNOSIS — Z6841 Body Mass Index (BMI) 40.0 and over, adult: Secondary | ICD-10-CM | POA: Diagnosis not present

## 2019-06-11 DIAGNOSIS — N186 End stage renal disease: Secondary | ICD-10-CM | POA: Diagnosis present

## 2019-06-11 DIAGNOSIS — Z20822 Contact with and (suspected) exposure to covid-19: Secondary | ICD-10-CM | POA: Diagnosis present

## 2019-06-11 DIAGNOSIS — E538 Deficiency of other specified B group vitamins: Secondary | ICD-10-CM | POA: Diagnosis present

## 2019-06-11 DIAGNOSIS — E1142 Type 2 diabetes mellitus with diabetic polyneuropathy: Secondary | ICD-10-CM | POA: Diagnosis present

## 2019-06-11 DIAGNOSIS — I10 Essential (primary) hypertension: Secondary | ICD-10-CM | POA: Diagnosis not present

## 2019-06-11 DIAGNOSIS — L409 Psoriasis, unspecified: Secondary | ICD-10-CM | POA: Diagnosis present

## 2019-06-11 DIAGNOSIS — I272 Pulmonary hypertension, unspecified: Secondary | ICD-10-CM | POA: Diagnosis present

## 2019-06-11 DIAGNOSIS — I959 Hypotension, unspecified: Secondary | ICD-10-CM | POA: Diagnosis present

## 2019-06-11 DIAGNOSIS — N269 Renal sclerosis, unspecified: Secondary | ICD-10-CM | POA: Diagnosis present

## 2019-06-11 DIAGNOSIS — R0600 Dyspnea, unspecified: Secondary | ICD-10-CM | POA: Diagnosis present

## 2019-06-11 DIAGNOSIS — D631 Anemia in chronic kidney disease: Secondary | ICD-10-CM | POA: Diagnosis present

## 2019-06-11 DIAGNOSIS — I5043 Acute on chronic combined systolic (congestive) and diastolic (congestive) heart failure: Secondary | ICD-10-CM | POA: Diagnosis not present

## 2019-06-11 DIAGNOSIS — M5137 Other intervertebral disc degeneration, lumbosacral region: Secondary | ICD-10-CM | POA: Diagnosis present

## 2019-06-11 DIAGNOSIS — I5023 Acute on chronic systolic (congestive) heart failure: Secondary | ICD-10-CM | POA: Diagnosis present

## 2019-06-11 DIAGNOSIS — E785 Hyperlipidemia, unspecified: Secondary | ICD-10-CM | POA: Diagnosis present

## 2019-06-11 DIAGNOSIS — J9601 Acute respiratory failure with hypoxia: Secondary | ICD-10-CM | POA: Diagnosis present

## 2019-06-11 DIAGNOSIS — I081 Rheumatic disorders of both mitral and tricuspid valves: Secondary | ICD-10-CM | POA: Diagnosis present

## 2019-06-11 DIAGNOSIS — E1122 Type 2 diabetes mellitus with diabetic chronic kidney disease: Secondary | ICD-10-CM | POA: Diagnosis present

## 2019-06-11 DIAGNOSIS — Z933 Colostomy status: Secondary | ICD-10-CM | POA: Diagnosis not present

## 2019-06-11 DIAGNOSIS — I132 Hypertensive heart and chronic kidney disease with heart failure and with stage 5 chronic kidney disease, or end stage renal disease: Secondary | ICD-10-CM | POA: Diagnosis present

## 2019-06-11 LAB — CBC
HCT: 32.8 % — ABNORMAL LOW (ref 36.0–46.0)
Hemoglobin: 10.2 g/dL — ABNORMAL LOW (ref 12.0–15.0)
MCH: 29.6 pg (ref 26.0–34.0)
MCHC: 31.1 g/dL (ref 30.0–36.0)
MCV: 95.1 fL (ref 80.0–100.0)
Platelets: 210 10*3/uL (ref 150–400)
RBC: 3.45 MIL/uL — ABNORMAL LOW (ref 3.87–5.11)
RDW: 15.3 % (ref 11.5–15.5)
WBC: 8.2 10*3/uL (ref 4.0–10.5)
nRBC: 0.2 % (ref 0.0–0.2)

## 2019-06-11 LAB — GLUCOSE, CAPILLARY
Glucose-Capillary: 116 mg/dL — ABNORMAL HIGH (ref 70–99)
Glucose-Capillary: 165 mg/dL — ABNORMAL HIGH (ref 70–99)
Glucose-Capillary: 116 mg/dL — ABNORMAL HIGH (ref 70–99)
Glucose-Capillary: 167 mg/dL — ABNORMAL HIGH (ref 70–99)

## 2019-06-11 LAB — TROPONIN I (HIGH SENSITIVITY)
Troponin I (High Sensitivity): 45 ng/L — ABNORMAL HIGH (ref <18)
Troponin I (High Sensitivity): 41 ng/L — ABNORMAL HIGH (ref <18)

## 2019-06-11 LAB — BASIC METABOLIC PANEL
Anion gap: 10 (ref 5–15)
BUN: 36 mg/dL — ABNORMAL HIGH (ref 8–23)
CO2: 29 mmol/L (ref 22–32)
Calcium: 8.5 mg/dL — ABNORMAL LOW (ref 8.9–10.3)
Chloride: 102 mmol/L (ref 98–111)
Creatinine, Ser: 3.63 mg/dL — ABNORMAL HIGH (ref 0.44–1.00)
GFR calc Af Amer: 14 mL/min — ABNORMAL LOW (ref >60)
GFR calc non Af Amer: 12 mL/min — ABNORMAL LOW (ref >60)
Glucose, Bld: 143 mg/dL — ABNORMAL HIGH (ref 70–99)
Potassium: 4.5 mmol/L (ref 3.5–5.1)
Sodium: 141 mmol/L (ref 135–145)

## 2019-06-11 LAB — PHOSPHORUS: Phosphorus: 4.8 mg/dL — ABNORMAL HIGH (ref 2.5–4.6)

## 2019-06-11 LAB — BRAIN NATRIURETIC PEPTIDE: B Natriuretic Peptide: 494 pg/mL — ABNORMAL HIGH (ref 0.0–100.0)

## 2019-06-11 MED ORDER — LIDOCAINE-PRILOCAINE 2.5-2.5 % EX CREA: 1.0000 "application " | TOPICAL_CREAM | CUTANEOUS | Status: DC | PRN

## 2019-06-11 MED ORDER — ALTEPLASE 2 MG IJ SOLR
2.0000 mg | Freq: Once | INTRAMUSCULAR | Status: DC | PRN
  Filled 2019-06-11: qty 2

## 2019-06-11 MED ORDER — CHLORHEXIDINE GLUCONATE CLOTH 2 % EX PADS
6.0000 | MEDICATED_PAD | Freq: Every day | CUTANEOUS | Status: DC
  Administered 2019-06-12 – 2019-06-14 (×3): 6 via TOPICAL
  Filled 2019-06-11: qty 6

## 2019-06-11 MED ORDER — IRBESARTAN 150 MG PO TABS
75.0000 mg | ORAL_TABLET | Freq: Every day | ORAL | Status: DC
  Filled 2019-06-11 (×2): qty 1

## 2019-06-11 MED ORDER — APIXABAN 5 MG PO TABS
5.0000 mg | ORAL_TABLET | Freq: Two times a day (BID) | ORAL | Status: DC
  Administered 2019-06-11 – 2019-06-14 (×5): 5 mg via ORAL
  Filled 2019-06-11 (×5): qty 1

## 2019-06-11 MED ORDER — HEPARIN SODIUM (PORCINE) 1000 UNIT/ML DIALYSIS
1000.0000 [IU] | INTRAMUSCULAR | Status: DC | PRN
  Filled 2019-06-11: qty 1

## 2019-06-11 MED ORDER — PENTAFLUOROPROP-TETRAFLUOROETH EX AERO: 1.0000 "application " | INHALATION_SPRAY | CUTANEOUS | Status: DC | PRN

## 2019-06-11 MED ORDER — APIXABAN 2.5 MG PO TABS
2.5000 mg | ORAL_TABLET | Freq: Two times a day (BID) | ORAL | Status: DC
  Filled 2019-06-11: qty 1

## 2019-06-11 MED ORDER — SODIUM CHLORIDE 0.9 % IV SOLN: 100.0000 mL | INTRAVENOUS | Status: DC | PRN

## 2019-06-11 MED ORDER — LIDOCAINE HCL (PF) 1 % IJ SOLN: 5.0000 mL | INTRAMUSCULAR | Status: DC | PRN

## 2019-06-11 MED ORDER — ALBUMIN HUMAN 25 % IV SOLN
12.5000 g | INTRAVENOUS | Status: DC
  Administered 2019-06-12: 12.5 g via INTRAVENOUS
  Filled 2019-06-11 (×2): qty 50

## 2019-06-11 NOTE — ED Notes (Signed)
Pt lying in bed alert & oriented. Nothing needed from staff at this time

## 2019-06-11 NOTE — ED Notes (Signed)
Pt transported to hemodialysis on 2nd floor in room 205 & 206. Wheelchair, monitor, and oxygenation left with pt for transportation.

## 2019-06-11 NOTE — Progress Notes (Signed)
This note also relates to the following rows which could not be included: Pulse Rate - Cannot attach notes to unvalidated device data Resp - Cannot attach notes to unvalidated device data  Hd completed  

## 2019-06-11 NOTE — ED Notes (Signed)
Pt back from CT. Pt eating breakfast at this time.

## 2019-06-11 NOTE — Progress Notes (Signed)
Pt resting in bed. Requested Oxy for pain relief in left side of chest (4 of 10 pain).

## 2019-06-11 NOTE — Consult Note (Signed)
CARDIOLOGY CONSULT NOTE               Patient ID: [name] MRN: [id number] DOB/AGE: 1950-06-29 69 y.o.  Admit date: 06/10/2019 Referring Physician Mansy Primary Physician Gaynelle Cage Cardiologist Nehemiah Massed Reason for Consultation acute on systolic chronic CHF  HPI: 69 year old female patient referred for evaluation of acute on chronic systolic CHF. The patient also has a history of ESRD on HD (Tues.,Thurs., Sat.), atrial fibrillation on Eliquis and amiodarone, hypertension, type 2 diabetes, obesity, hyperlipidemia, chronic systolic CHF with LVEF 99%, history of severe aortic stenosis, status post TAVR 08/2017, LBBB, and coronary artery disease with 60% stenosis D2 per cardiac catheterization in 08/2017. The patient reports a greater than 2-week history of progressively worsening exertional dyspnea induced by minimal activity with orthopnea and peripheral edema. She denies any significant chest discomfort. She has occasional palpitations described as heart racing. She denies presyncope or syncope. She denies improvement in her symptoms after dialysis. She reports compliance with all of her medications, and has not missed any dialysis sessions. Admission labs notable for creatinine 2.99, BUN 26, high sensitivity troponin 40 and 41, TSH 3.091. Chest xray revealed cardiomegaly with vascular congestion. She was hypoxic and placed on oxygen via nasal cannula, which she does not use at home. ECG revealed atrial fibrillation at a rate of 112 bpm with LBBB and nonspecific ST-T wave abnormalities.  Review of systems complete and found to be negative unless listed above     Past Medical History:  Diagnosis Date  . Anemia   . Anxiety   . Aortic valve stenosis   . Arrhythmia    atrial fibrillation  . Arthritis    feet, legs  . B12 deficiency   . Bowel obstruction ([place])   . CHF (congestive heart failure) ([place])   . CKD (chronic kidney disease)    protein in urine  . Colostomy in place  [facility])   . Diabetes mellitus without complication ([place])   . Dialysis patient [facility])    T/Th/Sa  . Diverticulitis large intestine   . Dysrhythmia   . 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 FSGS (focal segmental glomerulosclerosis)   . Gastritis   . GI bleed   . Heart murmur   . Hyperlipidemia   . Hypertension   . Hypothyroidism   . Hypothyroidism    unspecified  . MRSA (methicillin resistant Staphylococcus aureus)    at abdominal wound.  Jan 2017.  Treated.   . Neuropathy   . Neuropathy in diabetes ([place])   . Obesity   . Pedal edema   . Psoriasis   . Shortness of breath dyspnea   . Sleep apnea    CPAP  . Vertigo     Past Surgical History:  Procedure Laterality Date  . ABDOMINAL HYSTERECTOMY    . APPLICATION OF WOUND VAC N/A 02/24/2015   Procedure: APPLICATION OF WOUND VAC;  Surgeon: Clayburn Pert, MD;  Location: [facility];  Service: General;  Laterality: N/A;  . APPLICATION OF WOUND VAC N/A 02/26/2015   Procedure: APPLICATION OF WOUND VAC;  Surgeon: Clayburn Pert, MD;  Location: [facility];  Service: General;  Laterality: N/A;  . AV FISTULA PLACEMENT Left 01/29/2019   Procedure: ARTERIOVENOUS (AV) FISTULA CREATION ( BRACHIAL CEPHALIC);  Surgeon: Katha Cabal, MD;  Location: [facility];  Service: Vascular;  Laterality: Left;  . BACK SURGERY     spur frmoved from lower back  . CARDIAC CATHETERIZATION    . CARDIAC VALVE REPLACEMENT  08/2017  [place] N/A 07/15/2018   Procedure: CARDIOVERSION;  Surgeon: Corey Skains, MD;  Location: [facility];  Service: Cardiovascular;  Laterality: N/A;  . CATARACT EXTRACTION W/PHACO Right 11/16/2018   Procedure: CATARACT EXTRACTION PHACO AND INTRAOCULAR LENS PLACEMENT (IOC) RIGHT DIABETiC  01:18.3  13.5%  10.82;  Surgeon: Eulogio Bear, MD;  Location: [place];  Service: Ophthalmology;  Laterality: Right;  Diabetic - insulin sleep apnea  . CATARACT EXTRACTION W/PHACO Left 12/07/2018   Procedure: CATARACT  EXTRACTION PHACO AND INTRAOCULAR LENS PLACEMENT (IOC) LEFT DIABETIC 01:02.6  8.8%  5.77;  Surgeon: Eulogio Bear, MD;  Location: [place];  Service: Ophthalmology;  Laterality: Left;  Diabetic - insulin  . CHOLECYSTECTOMY    . COLECTOMY    . COLONOSCOPY WITH PROPOFOL N/A 09/04/2016   Procedure: COLONOSCOPY WITH PROPOFOL;  Surgeon: Manya Silvas, MD;  Location[facility];  Service: Endoscopy;  Laterality: N/A;  . COLOSTOMY REVERSAL N/A 07/12/2015   Procedure: COLOSTOMY REVERSAL;  Surgeon: Clayburn Pert, MD;  Location: [facility];  Service: General;  Laterality: N/A;  . DEBRIDEMENT OF ABDOMINAL WALL ABSCESS N/A 02/22/2015   Procedure: DEBRIDEMENT OF ABDOMINAL WALL ABSCESS;  Surgeon: Clayburn Pert, MD;  Location: [facility];  Service: General;  Laterality: N/A;  . DIALYSIS/PERMA CATHETER INSERTION N/A 10/20/2018   Procedure: DIALYSIS/PERMA CATHETER INSERTION;  Surgeon: Katha Cabal, MD;  Location: [place] [facility];  Service: Cardiovascular;  Laterality: N/A;  . EXCISION MASS ABDOMINAL N/A 02/24/2015   Procedure: EXCISION MASS ABDOMINAL  / [place];  Surgeon: Clayburn Pert, MD;  Location: [facility];  Service: General;  Laterality: N/A;  . EXCISION MASS ABDOMINAL N/A 02/26/2015   Procedure: EXCISION MASS ABDOMINAL/wash out;  Surgeon: Clayburn Pert, MD;  Location: [facility];  Service: General;  Laterality: N/A;  . FLEXIBLE SIGMOIDOSCOPY  06/19/2015   Procedure: FLEXIBLE SIGMOIDOSCOPY;  Surgeon: Lucilla Lame, MD;  Location: [place];  Service: Endoscopy;;  UNABLE TO ACCESS OSTOMY SITE FOR ACCESS INTO COLON  . HERNIA REPAIR     umbilical  . LAPAROSCOPY  07/12/2015   Procedure: LAPAROSCOPY DIAGNOSTIC;  Surgeon: Clayburn Pert, MD;  Location: [facility];  Service: General;;  . LAPAROTOMY  07/12/2015   Procedure: EXPLORATORY LAPAROTOMY;  Surgeon: Clayburn Pert, MD;  Location: [facility];  Service: General;;  . LAPAROTOMY N/A 02/06/2015   Procedure: Laparotomy, reduction  of incarcerated parastomal hernia, repair of parastomal hernia with mesh;  Surgeon: Sherri Rad, MD;  Location: [facility];  Service: General;  Laterality: N/A;  . laparotomy closure of cecal perforation  05/09/2013   Dr. Marina Gravel  . LYSIS OF ADHESION  07/12/2015   Procedure: LYSIS OF ADHESION;  Surgeon: Clayburn Pert, MD;  Location: [facility];  Service: General;;  . TONSILLECTOMY    . WOUND DEBRIDEMENT N/A 03/03/2015   Procedure: DEBRIDEMENT ABDOMINAL WOUND;  Surgeon: Florene Glen, MD;  Location: [facility];  Service: General;  Laterality: N/A;  . WOUND DEBRIDEMENT N/A 03/09/2015   Procedure: DEBRIDEMENT ABDOMINAL WOUND;  Surgeon: Florene Glen, MD;  Location: [facility];  Service: General;  Laterality: N/A;    (Not in a hospital admission)  Social History   Socioeconomic History  . Marital status: Divorced    Spouse name: Not on file  . Number of children: 1  . Years of education: Not on file  . Highest education level: Not on file  Occupational History  . Occupation: Retired     Comment: Licensed conveyancer   Tobacco  Use  . Smoking status: Former Smoker    Types: Cigarettes    Quit date: 04/18/2013    Years since quitting: 6.1  . Smokeless tobacco: Never Used  Substance and Sexual Activity  . Alcohol use: No    Alcohol/week: 0.0 standard drinks  . Drug use: No  . Sexual activity: Not on file  Other Topics Concern  . Not on file  Social History Narrative  . Not on file   Social Determinants of Health   Financial Resource Strain: Low Risk   . Difficulty of Paying Living Expenses: Not very hard  Food Insecurity: No Food Insecurity  . Worried About Charity fundraiser in the Last Year: Never true  . Ran Out of Food in the Last Year: Never true  Transportation Needs: No Transportation Needs  . Lack of Transportation (Medical): No  . Lack of Transportation (Non-Medical): No  Physical Activity:   . Days of Exercise per Week:   . Minutes of Exercise per Session:   Stress:   . Feeling  of Stress :   Social Connections: Unknown  . Frequency of Communication with Friends and Family: More than three times a week  . Frequency of Social Gatherings with Friends and Family: Not on file  . Attends Religious Services: Not on file  . Active Member of Clubs or Organizations: Not on file  . Attends Archivist Meetings: Not on file  . Marital Status: Divorced  Human resources officer Violence: Not At Risk  . Fear of Current or Ex-Partner: No  . Emotionally Abused: No  . Physically Abused: No  . Sexually Abused: No    Family History  Problem Relation Age of Onset  . Diabetes Mother   . Hypertension Father   . CAD Father   . Heart attack Father   . Colon cancer Brother       Review of systems complete and found to be negative unless listed above      PHYSICAL EXAM  General: Well developed, well nourished, in no acute distress, lying in bed resting HEENT:  Normocephalic and atramatic Lungs: Clear bilaterally to auscultation and percussion. Heart: irregularly irregular .  Abdomen: nondistended Msk:  Back normal, gait not assessed. No obvious deformities. Extremities: No clubbing, cyanosis or edema.   Neuro: Alert and oriented X 3. Psych:  Good affect, responds appropriately  Labs:   Lab Results  Component Value Date   WBC 8.2 06/11/2019   HGB 10.2 (L) 06/11/2019   HCT 32.8 (L) 06/11/2019   MCV 95.1 06/11/2019   PLT 210 06/11/2019    Recent Labs  Lab 06/11/19 0455  NA 141  K 4.5  CL 102  CO2 29  BUN 36*  CREATININE 3.63*  CALCIUM 8.5*  GLUCOSE 143*   Lab Results  Component Value Date   TROPONINI <0.03 07/12/2018   No results found for: CHOL No results found for: HDL No results found for: LDLCALC No results found for: TRIG No results found for: CHOLHDL No results found for: LDLDIRECT    Radiology: DG Chest 2 View  Result Date: 06/10/2019 CLINICAL DATA:  Chest pain EXAM: CHEST - 2 VIEW COMPARISON:  06/02/2019 FINDINGS: Right dialysis  catheter remains in place, unchanged. Cardiomegaly with vascular congestion. No overt edema confluent opacities or effusions. No acute bony abnormality. IMPRESSION: Cardiomegaly, vascular congestion. Electronically Signed   By: Rolm Baptise M.D.   On: 06/10/2019 19:07   DG Chest Portable 1 View  Result Date: 06/02/2019 CLINICAL  DATA:  Shortness of breath EXAM: PORTABLE CHEST 1 VIEW COMPARISON:  01/29/2019 FINDINGS: Surgical hardware in the cervical spine. Right-sided central venous catheter tip over the SVC. Cardiomegaly with vascular congestion and mild diffuse interstitial opacities suggestive of minimal edema. No pleural effusion or pneumothorax. Aortic atherosclerosis. IMPRESSION: Cardiomegaly with vascular congestion and mild diffuse interstitial opacities suggestive of minimal edema. Electronically Signed   By: Donavan Foil M.D.   On: 06/02/2019 19:18    EKG: atrial fibrillation with controlled rate with known LBBB  ASSESSMENT AND PLAN:  1. Acute hypoxemic respiratory failure with acute on chronic systolic CHF, with LVEF 22% per most recent echocardiogram 11/2018. Chest xray reveals vascular congestion. Patient requiring supplemental oxygen via nasal cannula. 2. Severe aortic stenosis, status post TAVR 08/2017. Normal functioning prosthetic valve per most recent echocardiogram 3. ESRD on hemodialysis, Tues/Thurs/Sat. 4. Paroxsymal atrial fibrillation on Eliquis 5 mg BID, metoprolol succinate, and amiodarone 200 mg daily. Reports occasional palpitations. Most recent TSH and transaminases in normal range. 5. Hypertension 6. Coronary artery disease, mild, per cardiac catheterization 08/2017  Recommendations: 1. Agree with current therapy. 2. Reduce dose of Eliquis to 2.5 mg BID due to CKD 3. Obtain Chest CT without contrast to assess pulmonary dysfunction as patient is on amiodarone. 4. 2D echocardiogram to assess LV function and prosthetic aortic valve 5. Continue to trend troponin; check  BNP 6. Continue IV Lasix 40 mg BID; await nephrology recommendations for ongoing diuresis 7. Continue amiodarone 200 mg daily for rhythm control 8. Continue metoprolol succinate 25 mg BID rate rate control 9. Recommend discontinuing amlodipine, restarting telmisartan 10. Continue atorvastatin for hyperlipidemia and CAD management 11. Further recommendations pending patient's initial course  Signed: Sharolyn Douglas 06/11/2019, 8:04 AM   Discussed with Dr. Saralyn Pilar who agrees with above plan.

## 2019-06-11 NOTE — Progress Notes (Addendum)
PROGRESS NOTE    Patient: [name]                            PCP: Adin Hector, MD                    DOB: [birth year]            DOA: 06/10/2019 [id number]             DOS: 06/11/2019, 11:05 AM   LOS: 0 days   Date of Service: The patient was seen and examined on 06/11/2019  Subjective:   The patient was seen and examined this morning, laying comfortably in bed, on 2 L of oxygen Satting greater 90%, denies any chest pain, only shortness of breath, which gets worse with exertion. Reporting she was dialyzed yesterday, stating no excessive lower extremity edema.  Brief Narrative:   [name]  is a 69 y.o. with PMH of ESRD, on HD TTS, A. fib on Eliquis, CHF, HTN, HPT, DM 2, presented with progressive shortness of breath, hypoxemia. On arrival patient with a sat of 88% on room air, improved to 99% with 2 L of oxygen Chest x-ray revealed cardiomegaly, pulmonary congestion Influenza A/B SARS-CoV-2 PCR negative EKG consistent with A. fib with a rate of 112, LVH, mildly widened QRS, Patient received 80 mg of IV Lasix in ED  Respiratory distress likely secondary to volume overload, ES RD, sCHF exacerbation   Assessment & Plan:   Active Problems:   Hypoxia  Acute hypoxemic respiratory failure, likely secondary to fluid overload with end-stage renal disease on hemodialysis and mild acute on chronic systolic CHF. -We will continue to monitor patient closely today -Currently on IV Lasix -Holding home medication of Demadex -Appreciate nephrology input. Dr. Holley Raring  -2D echocardiogram >>>   She has a history of an EF of 25% on a 2D echo at [facility] on 06/24/2018 that has improved to 40% with moderate mitral regurgitation and tricuspid regurgitation. -Cardiology Dr. Saralyn Pilar consulted appreciate input  Paroxysmal atrial fibrillation with rapid ventricular sponsor. -Stable -Heart rate has improved to 98. -We will continue her Eliquis, Toprol-XL and amiodarone.     Hypertension. -We will continue her Norvasc, Toprol-XL and ARB.  Hypothyroidism. -Stable continue home dose throat TSH normal at 3.091   Type 2 diabetes mellitus. -Checking CBG QA CHS, with SSI coverage -We will holding home agents   Dyslipidemia. -We will continue her statin therapy.    ------------------------------------------------------------------------------------------------------------------------  DVT prophylaxis: [id number] Code Status:   Code Status: Full Code Family Communication: No family member present at bedside- attempt will be made to update daily The above findings and plan of care has been discussed with patient (and family )  in detail,  they expressed understanding and agreement of above. -Advance care planning has been discussed.   Admission status:   Status is: Inpatient  Patient meets the inpatient criteria due to hypoxia, needed supplemental oxygen, cardiac evaluation for congestive heart failure, volume overload-needing IP hemodialysis.   Dispo: The patient is from: Home              Anticipated d/c is to: Home              Anticipated d/c date is: 1 day              Patient currently is not medically stable to d/c.  Requiring oxygen, IV diuretics  In mild respiratory  stress requiring 2 L of oxygen, not O2 dependent at baseline        Consultants: Cardiology nephrology  Procedures:   No admission procedures for hospital encounter.   Echo:   Antimicrobials:  Anti-infectives (From admission, onward)   None       Medication:  . amiodarone  200 mg Oral Daily  . amLODipine  5 mg Oral Daily  . apixaban  2.5 mg Oral BID  . atorvastatin  40 mg Oral Daily  . Chlorhexidine Gluconate Cloth  6 each Topical Q0600  . DULoxetine  30 mg Oral Daily  . furosemide  40 mg Intravenous Q12H  . insulin aspart  6 Units Subcutaneous TID WC  . insulin glargine  45 Units Subcutaneous QHS  . levothyroxine  88 mcg Oral QAC breakfast  .  metoprolol succinate  25 mg Oral BID  . sodium chloride flush  3 mL Intravenous Q12H  . [START ON 06/15/2019] Vitamin D (Ergocalciferol)  50,000 Units Oral Q Tue    sodium chloride, sodium chloride, sodium chloride, acetaminophen **OR** acetaminophen, alteplase, azelastine, benzonatate, heparin, hydrocortisone, lidocaine (PF), lidocaine-prilocaine, ondansetron **OR** ondansetron (ZOFRAN) IV, oxyCODONE, pentafluoroprop-tetrafluoroeth, sodium chloride flush, traZODone   Objective:   Vitals:   06/11/19 0410 06/11/19 0530 06/11/19 0715 06/11/19 0722  BP: 118/66 112/78  116/66  Pulse: 78 100 70 78  Resp: 20 18  18   Temp:      TempSrc:      SpO2: 92% 92% 95% 95%  Weight:      Height:        Intake/Output Summary (Last 24 hours) at 06/11/2019 1105 Last data filed at 06/11/2019 0950 Gross per 24 hour  Intake --  Output 700 ml  Net -700 ml   Filed Weights   06/10/19 1719  Weight: 97.5 kg     Examination:   Physical Exam  Constitution:  Alert, cooperative, no distress,  Appears calm and comfortable  Psychiatric: Normal and stable mood and affect, cognition intact,   HEENT: Normocephalic, PERRL, otherwise with in Normal limits  Chest:Chest symmetric Cardio vascular:  S1/S2, RRR, No murmure, No Rubs or Gallops  pulmonary: Clear to auscultation bilaterally, respirations unlabored, negative wheezes / crackles Abdomen: Soft, non-tender, non-distended, bowel sounds,no masses, no organomegaly Muscular skeletal: Limited exam - in bed, able to move all 4 extremities, Normal strength,  Neuro: CNII-XII intact. , normal motor and sensation, reflexes intact  Extremities: No pitting edema lower extremities, +2 pulses  Skin: Dry, warm to touch, negative for any Rashes, No open wounds Wounds: per nursing documentation               LABs:  CBC Latest Ref Rng & Units 06/11/2019 06/10/2019 06/02/2019  WBC 4.0 - 10.5 K/uL 8.2 7.7 8.7  Hemoglobin 12.0 - 15.0 g/dL 10.2(L) 11.0(L) 11.1(L)   Hematocrit 36.0 - 46.0 % 32.8(L) 36.5 37.0  Platelets 150 - 400 K/uL 210 218 214   CMP Latest Ref Rng & Units 06/11/2019 06/10/2019 06/02/2019  Glucose 70 - 99 mg/dL 143(H) 201(H) 202(H)  BUN 8 - 23 mg/dL 36(H) 26(H) 34(H)  Creatinine 0.44 - 1.00 mg/dL 3.63(H) 2.99(H) 4.24(H)  Sodium 135 - 145 mmol/L 141 140 140  Potassium 3.5 - 5.1 mmol/L 4.5 3.9 4.5  Chloride 98 - 111 mmol/L 102 99 99  CO2 22 - 32 mmol/L 29 31 27   Calcium 8.9 - 10.3 mg/dL 8.5(L) 8.4(L) 8.7(L)  Total Protein 6.5 - 8.1 g/dL - - -  Total Bilirubin 0.3 - 1.2  mg/dL - - -  Alkaline Phos 38 - 126 U/L - - -  AST 15 - 41 U/L - - -  ALT 14 - 54 U/L - - -        SIGNED: Deatra James, MD, FACP, FHM. [facility],  Pager [phone] (please [email or web address] to page/text)  If 7PM-7AM, please contact night-coverage [email or web address], Password [facility] 06/11/2019, 11:05 AM

## 2019-06-11 NOTE — Progress Notes (Signed)
Hemodialysis patient known at [place] 6:30am, patient states that her sister drives her to treatments. Please contact me with any dialysis placement concerns.

## 2019-06-11 NOTE — Progress Notes (Signed)
Hd started via right [place] catheter, Fistula looks underdeveloped and deep with some skin discoloration that made attempting to cannulate questionable.

## 2019-06-12 DIAGNOSIS — Z9189 Other specified personal risk factors, not elsewhere classified: Secondary | ICD-10-CM

## 2019-06-12 DIAGNOSIS — R5381 Other malaise: Secondary | ICD-10-CM

## 2019-06-12 DIAGNOSIS — I480 Paroxysmal atrial fibrillation: Secondary | ICD-10-CM

## 2019-06-12 DIAGNOSIS — E1142 Type 2 diabetes mellitus with diabetic polyneuropathy: Secondary | ICD-10-CM

## 2019-06-12 DIAGNOSIS — I5023 Acute on chronic systolic (congestive) heart failure: Secondary | ICD-10-CM

## 2019-06-12 DIAGNOSIS — J9601 Acute respiratory failure with hypoxia: Secondary | ICD-10-CM

## 2019-06-12 DIAGNOSIS — R0902 Hypoxemia: Secondary | ICD-10-CM

## 2019-06-12 DIAGNOSIS — N186 End stage renal disease: Secondary | ICD-10-CM

## 2019-06-12 DIAGNOSIS — I10 Essential (primary) hypertension: Secondary | ICD-10-CM

## 2019-06-12 LAB — GLUCOSE, CAPILLARY
Glucose-Capillary: 181 mg/dL — ABNORMAL HIGH (ref 70–99)
Glucose-Capillary: 103 mg/dL — ABNORMAL HIGH (ref 70–99)
Glucose-Capillary: 200 mg/dL — ABNORMAL HIGH (ref 70–99)

## 2019-06-12 LAB — PARATHYROID HORMONE, INTACT (NO CA): PTH: 202 pg/mL — ABNORMAL HIGH (ref 15–65)

## 2019-06-12 MED ORDER — NEPRO/CARBSTEADY PO LIQD: 237.0000 mL | Freq: Two times a day (BID) | ORAL | Status: DC

## 2019-06-12 MED ORDER — PRO-STAT SUGAR FREE PO LIQD
30.0000 mL | Freq: Two times a day (BID) | ORAL | Status: DC
  Administered 2019-06-12 – 2019-06-13 (×2): 30 mL via ORAL

## 2019-06-12 MED ORDER — RENA-VITE PO TABS
1.0000 | ORAL_TABLET | Freq: Every day | ORAL | Status: DC
  Administered 2019-06-12 – 2019-06-13 (×2): 1 via ORAL
  Filled 2019-06-12 (×3): qty 1

## 2019-06-12 NOTE — Progress Notes (Signed)
   06/12/19 1815  Hand-Off documentation  Handoff Given Given to shift RN/LPN  Report given to (Full Name) Tammy H RN  Handoff Received Received from shift RN/LPN  Report received from (Full Name) Hoy Morn RN  Vital Signs  Temp 98.2 F (36.8 C)  Temp Source Oral  Pulse Rate (!) 123  Pulse Rate Source Monitor  Resp 19  BP 107/65  BP Location Right Arm  BP Method Automatic  Patient Position (if appropriate) Lying  Oxygen Therapy  SpO2 100 %  O2 Device Nasal Cannula  O2 Flow Rate (L/min) 2 L/min  Pain Assessment  Pain Scale 0-10  Pain Score 0  During Hemodialysis Assessment  Blood Flow Rate (mL/min) 150 mL/min  Arterial Pressure (mmHg) -10 mmHg  Venous Pressure (mmHg) 40 mmHg  Transmembrane Pressure (mmHg) 70 mmHg  Ultrafiltration Rate (mL/min) 0 mL/min  Dialysate Flow Rate (mL/min) 800 ml/min  Conductivity: Machine  13.4  HD Safety Checks Performed Yes  KECN 55.3 KECN  Dialysis Fluid Bolus Normal Saline  Bolus Amount (mL) 250 mL  Intra-Hemodialysis Comments Tolerated well;Tx completed  Post-Hemodialysis Assessment  Rinseback Volume (mL) 250 mL  KECN 55.3 V  Dialyzer Clearance Clear  Duration of HD Treatment -hour(s) 3.5 hour(s)  Hemodialysis Intake (mL) 500 mL  UF Total -Machine (mL) 1500 mL  Net UF (mL) 1000 mL  Tolerated HD Treatment Yes  Post-Hemodialysis Comments tolerated well  Education / Care Plan  Dialysis Education Provided Yes  Note  Observations no problems tolerated well  Tolerated well no s/s of distress treatment complete

## 2019-06-12 NOTE — Progress Notes (Signed)
I have reviewed the charting completed by the Student-RN and I agree with his assessments.  Cedricka Sackrider A Audryana Hockenberry, RN 

## 2019-06-12 NOTE — Progress Notes (Signed)
Pt hypotensive. Sharion Settler NP consulted. Stated to continue to monitor per Hassan Rowan. Will notify Hassan Rowan if destats.

## 2019-06-12 NOTE — Progress Notes (Signed)
Initial Nutrition Assessment  DOCUMENTATION CODES:   Morbid obesity  INTERVENTION:  Nepro Shake po BID, each supplement provides 425 kcal and 19 grams protein  Prostat 30 ml po BID, each supplement provides 100 kcal and 15 grams of protein  Rena-vit po daily   NUTRITION DIAGNOSIS:   Increased nutrient needs related to chronic illness(ESRD on HD) as evidenced by estimated needs.    GOAL:   Patient will meet greater than or equal to 90% of their needs    MONITOR:   Labs, I & O's, Supplement acceptance, PO intake, Weight trends  REASON FOR ASSESSMENT:   Malnutrition Screening Tool    ASSESSMENT:  RD working remotely.  69 year old female with past medical history of ESRD on HD, colostomy, atrial fibrillation, CHF, HTN, hypothyroidism, DM2, presented with acute onset of dyspnea that has been worsening on exertion over the past couple of weeks. CXR showed cardiomegaly with vascular congestion  Patient admitted on 4/23 for acute hypoxic respiratory failure secondary to fluid overload  HD: TTS ,Left AV fistula EDW: 115.5 kg  Unable to reach patient via phone today to obtain nutrition history. She is on a HH/CM diet, per flowsheets she consumed 75% of her breakfast this morning. Will provide Nepro and prostat supplements to aid with meeting needs.   INet UF: 2000 ml UOP: 700 ml x 24 hrs Current wt 247.5 lbs Per history, on 4/14 pt weighed 229.46 lbs, on 2/22 pt weighed 245.52 lbs, on 12/11 pt weighed 229.46 lbs, on 12/08 pt weighed 242.88 lbs.  Medications reviewed and include: IV Lasix 40 mg every 12 hrs, SSI, Lantus 45 units daily, Vit D  Labs: CBGs 103,181,167,116 x 24 hrs, BUN 36 (H), Cr 3.63 (H), P 4.5 (H), BNP 494 (H)  NUTRITION - FOCUSED PHYSICAL EXAM: Unable to complete at this time, RD working remotely.  Diet Order:   Diet Order            Diet heart healthy/carb modified Room service appropriate? Yes; Fluid consistency: Thin  Diet effective now              EDUCATION NEEDS:   No education needs have been identified at this time  Skin:  Skin Assessment: Reviewed RN Assessment  Last BM:  4/23  Height:   Ht Readings from Last 1 Encounters:  06/10/19 5\' 3"  (1.6 m)    Weight:   Wt Readings from Last 1 Encounters:  06/12/19 112.5 kg    BMI:  Body mass index is 43.95 kg/m.  Estimated Nutritional Needs:   Kcal:  2542-7062  Protein:  115-125  Fluid:  UOP + 1000 ml    Lajuan Lines, RD, LDN Clinical Nutrition After Hours/Weekend Pager # in [place]

## 2019-06-12 NOTE — Progress Notes (Signed)
[name]  MRN: [id number]  DOB/AGE: 1950-07-15 69 y.o.  Primary Care Physician:Klein, Tonette Bihari, MD  Admit date: 06/10/2019  Chief Complaint:  Chief Complaint  Patient presents with  . Abnormal Lab    S-Pt presented on  06/10/2019 with  Chief Complaint  Patient presents with  . Abnormal Lab  . Patient main complaint in today's visit was I am waiting for my dialysis today.   Medications . amiodarone  200 mg Oral Daily  . apixaban  5 mg Oral BID  . atorvastatin  40 mg Oral Daily  . Chlorhexidine Gluconate Cloth  6 each Topical Q0600  . DULoxetine  30 mg Oral Daily  . furosemide  40 mg Intravenous Q12H  . insulin aspart  6 Units Subcutaneous TID WC  . insulin glargine  45 Units Subcutaneous QHS  . irbesartan  75 mg Oral Daily  . levothyroxine  88 mcg Oral QAC breakfast  . metoprolol succinate  25 mg Oral BID  . sodium chloride flush  3 mL Intravenous Q12H  . [START ON 06/15/2019] Vitamin D (Ergocalciferol)  50,000 Units Oral Q Tue         QZE:SPQZR from the symptoms mentioned above,there are no other symptoms referable to all systems reviewed.  Physical Exam: Vital signs in last 24 hours: Temp:  [97.2 F (36.2 C)-98 F (36.7 C)] 97.8 F (36.6 C) (04/24 0744) Pulse Rate:  [51-142] 60 (04/24 0744) Resp:  [13-30] 18 (04/24 0744) BP: (89-153)/(43-110) 105/62 (04/24 0744) SpO2:  [93 %-98 %] 94 % (04/24 0744) Weight:  [112.5 kg] 112.5 kg (04/24 0431) Weight change: 15 kg Last BM Date: 06/11/19  Intake/Output from previous day: 04/23 0701 - 04/24 0700 In: -  Out: 2700 [Urine:700] No intake/output data recorded.   Physical Exam: General- pt is awake,alert, oriented to time place and person Resp- No acute REsp distress, CTA B/L NO Rhonchi CVS- S1S2 regular in rate and rhythm GIT- BS+, soft, NT, ND EXT- NO LE Edema, Cyanosis Access patient has tunneled catheter as well as fistula in situ  Lab Results: CBC Recent Labs    06/10/19 1736 06/11/19 0455   WBC 7.7 8.2  HGB 11.0* 10.2*  HCT 36.5 32.8*  PLT 218 210    BMET Recent Labs    06/10/19 1736 06/11/19 0455  NA 140 141  K 3.9 4.5  CL 99 102  CO2 31 29  GLUCOSE 201* 143*  BUN 26* 36*  CREATININE 2.99* 3.63*  CALCIUM 8.4* 8.5*    MICRO Recent Results (from the past 240 hour(s))  Respiratory Panel by RT PCR (Flu A&B, Covid) - Nasopharyngeal Swab     Status: None   Collection Time: 06/10/19  6:45 PM   Specimen: Nasopharyngeal Swab  Result Value Ref Range Status   SARS Coronavirus 2 by RT PCR NEGATIVE NEGATIVE Final    Comment: (NOTE) SARS-CoV-2 target nucleic acids are NOT DETECTED. The SARS-CoV-2 RNA is generally detectable in upper respiratoy specimens during the acute phase of infection. The lowest concentration of SARS-CoV-2 viral copies this assay can detect is 131 copies/mL. A negative result does not preclude SARS-Cov-2 infection and should not be used as the sole basis for treatment or other patient management decisions. A negative result may occur with  improper specimen collection/handling, submission of specimen other than nasopharyngeal swab, presence of viral mutation(s) within the areas targeted by this assay, and inadequate number of viral copies (<131 copies/mL). A negative result must be combined with clinical observations,  patient history, and epidemiological information. The expected result is Negative. Fact Sheet for Patients:  PinkCheek.be Fact Sheet for Healthcare Providers:  GravelBags.it This test is not yet ap proved or cleared by the [place] FDA and  has been authorized for detection and/or diagnosis of SARS-CoV-2 by FDA under an Emergency Use Authorization (EUA). This EUA will remain  in effect (meaning this test can be used) for the duration of the COVID-19 declaration under Section 564(b)(1) of the Act, 21 U.S.C. section 360bbb-3(b)(1), unless the authorization is  terminated or revoked sooner.    Influenza A by PCR NEGATIVE NEGATIVE Final   Influenza B by PCR NEGATIVE NEGATIVE Final    Comment: (NOTE) The Xpert Xpress SARS-CoV-2/FLU/RSV assay is intended as an aid in  the diagnosis of influenza from Nasopharyngeal swab specimens and  should not be used as a sole basis for treatment. Nasal washings and  aspirates are unacceptable for Xpert Xpress SARS-CoV-2/FLU/RSV  testing. Fact Sheet for Patients: PinkCheek.be Fact Sheet for Healthcare Providers: GravelBags.it This test is not yet approved or cleared by the [place] FDA and  has been authorized for detection and/or diagnosis of SARS-CoV-2 by  FDA under an Emergency Use Authorization (EUA). This EUA will remain  in effect (meaning this test can be used) for the duration of the  Covid-19 declaration under Section 564(b)(1) of the Act, 21  U.S.C. section 360bbb-3(b)(1), unless the authorization is  terminated or revoked. Performed at [facility], [address] [place], [place] [address]       Lab Results  Component Value Date   PTH 202 (H) 06/11/2019   CALCIUM 8.5 (L) 06/11/2019   CAION 1.14 (L) 01/29/2019   PHOS 4.8 (H) 06/11/2019               Impression:   Patient is a 69 year old female with a past medical history of end-stage renal disease on hemodialysis-Tuesday Thursday Saturday schedule, atrial fibrillation on Eliquis and amiodarone, CHF, hypertension, hypothyroidism, diabetes mellitus type 2 who came to the ER with chief complaint of shortness of breath. Patient was admitted with acute hypoxic respiratory failure secondary to fluid overload, ESRD, proximal A. fib with rapid ventricular rate  1)Renal end-stage renal disease. Patient is on hemodialysis. Patient is on Tuesday Thursday Saturday schedule. We will dialyze patient today  2) hypotension Patient was hypotensive with systolic  blood pressure less than 98 4:30 in the morning Patient blood pressure is now better   3)Anemia of chronic disease  HGb at goal (9--11)   4) secondary hyperparathyroidism -CKD Mineral-Bone Disorder   Secondary Hyperparathyroidism present .  Phosphorus at goal.   5) acute on chronic CHF Patient chest x-ray this information showed vascular congestion Patient has now undergone fluid removal Patient is clinically better Patient is being followed by cardiology and the primary team  6) electrolytes   sodium Normonatremic   potassium Normokalemic    7)Acid base Co2 at goal  8) proximal atrial fibrillation Patient is on Eliquis, metoprolol and amiodarone. Patient is being closely followed with the primary team and cardiology   Plan:   We will dialyze patient today    [name] s Theador Hawthorne 06/12/2019, 8:26 AM

## 2019-06-12 NOTE — Progress Notes (Signed)
Pt hypotensive at 0431: 89/51 MAP 65  Complaining of feeling weak. Rechecked BP at 0556: BP 110/63. Patient states feeling better/stronger.

## 2019-06-12 NOTE — Progress Notes (Signed)
PROGRESS NOTE  [name] DJT:[id number] DOB: 05-17-1950   PCP: Adin Hector, MD  Patient is from: Home.  Uses walker at baseline  DOA: 06/10/2019 LOS: 1  Brief Narrative / Interim history: 69 year old female with history of ESRD on HD TTS, A. fib on Eliquis, systolic CHF, HTN and DM-2 who presented with progressive shortness of breath and hypoxemia, and admitted for acute hypoxemic respiratory failure thought to be due to fluid overload in the setting of ESRD and acute on chronic systolic CHF.  Reportedly saturating at 88% on RA on arrival.  CXR with cardiomegaly and pulmonary congestion.  COVID-19 and influenza A/B PCR negative.  BNP 494. HS Trop 45> 41.   Nephrology and cardiology consulted.  Started on IV Lasix as well.   Subjective: Seen and examined this afternoon.  No major events overnight of this morning.  No complaints.  Denies chest pain, dyspnea, GI or UTI symptoms.  Reports shortness of breath with exertion.   Objective: Vitals:   06/12/19 0431 06/12/19 0556 06/12/19 0744 06/12/19 1123  BP: (!) 89/51 110/63 105/62 134/87  Pulse: (!) 58 (!) 58 60 62  Resp:   18 20  Temp: 98 F (36.7 C)  97.8 F (36.6 C) 98.2 F (36.8 C)  TempSrc: Oral  Oral Oral  SpO2: 95%  94% 97%  Weight: 112.5 kg     Height:        Intake/Output Summary (Last 24 hours) at 06/12/2019 1447 Last data filed at 06/12/2019 1330 Gross per 24 hour  Intake 480 ml  Output 2000 ml  Net -1520 ml   Filed Weights   06/10/19 1719 06/12/19 0431  Weight: 97.5 kg 112.5 kg    Examination:  GENERAL: No apparent distress. Nontoxic.  HEENT: MMM.  Vision and hearing grossly intact.  NECK: Supple.  No apparent JVD.  RESP: On 2 L by [place].  No IWOB.  Fair aeration bilaterally. CVS:  RRR. Heart sounds normal.  ABD/GI/GU: BS present. Soft. Non tender.  MSK/EXT:  Moves extremities. No apparent deformity. No edema.  SKIN: no apparent skin lesion or wound NEURO: Awake, alert and oriented appropriately.  No  apparent focal neuro deficit. PSYCH: Calm. Normal affect.  Procedures:  None  Microbiology summarized: COVID-19 PCR negative. Influenza PCR negative.  Assessment & Plan: Acute respiratory failure with hypoxia-multifactorial including acute on chronic systolic CHF, pulmonary hypertension, aortic stenosis, ESRD and possible OSA/OHS.  -Treat treatable causes as below. -Wean oxygen as able-not on oxygen at home. -Incentive spirometry and OOB  Acute on chronic systolic CHF: Echo in 30/0923 with EF of 45% (improved from prior)..  BNP higher than baseline.  CXR with vascular congestion. Had UF of 2 L yesterday.  Also had 700 cc UOP yesterday. -Continue IV Lasix and HD per nephrology -Monitor fluid status -Sodium and fluid restriction  Severe aortic stenosis s/p TVAR in 08/2017 with normal functioning prosthetic valve  End-stage renal disease on HD TTS -Per nephrology  Paroxysmal A. fib with RVR: Now rate controlled. -Continue amiodarone, Toprol-XL and Eliquis.  Hypertension. -Continue Norvasc, Toprol-XL and ARB.  Hypothyroidism:  TSH 3.0. -Continue home Synthroid  DM-2: With hyperglycemia Recent Labs    06/11/19 2059 06/12/19 0818 06/12/19 1347  GLUCAP 167* 181* 103*  -Continue current regimen -Check A1c  Dyslipidemia. -Continue statin.  Morbid obesity:Body mass index is 43.95 kg/m. -Encourage lifestyle change to lose weight.  At risk for sleep apnea: -May benefit from outpatient sleep study.  Debility/physical deconditioning: Uses walker at  baseline. -PT/OT eval   Nutrition Problem: Increased nutrient needs Etiology: chronic illness(ESRD on HD)  Signs/Symptoms: estimated needs  Interventions: Nepro shake, Prostat, MVI       DVT prophylaxis: On Eliquis for A. fib. Code Status: Full code Family Communication: Patient and/or RN. Available if any question.   Discharge barrier: Acute hypoxic respiratory failure with fluid overload Patient is from:  Home Final disposition: To be determined after therapy evaluation  Consultants:  Cardiology Nephrology   Sch Meds:  Scheduled Meds: . amiodarone  200 mg Oral Daily  . apixaban  5 mg Oral BID  . atorvastatin  40 mg Oral Daily  . Chlorhexidine Gluconate Cloth  6 each Topical Q0600  . DULoxetine  30 mg Oral Daily  . feeding supplement (NEPRO CARB STEADY)  237 mL Oral BID BM  . feeding supplement (PRO-STAT SUGAR FREE 64)  30 mL Oral BID  . furosemide  40 mg Intravenous Q12H  . insulin aspart  6 Units Subcutaneous TID WC  . insulin glargine  45 Units Subcutaneous QHS  . irbesartan  75 mg Oral Daily  . levothyroxine  88 mcg Oral QAC breakfast  . metoprolol succinate  25 mg Oral BID  . multivitamin  1 tablet Oral QHS  . sodium chloride flush  3 mL Intravenous Q12H  . [START ON 06/15/2019] Vitamin D (Ergocalciferol)  50,000 Units Oral Q Tue   Continuous Infusions: . sodium chloride    . albumin human 12.5 g (06/12/19 1329)   PRN Meds:.sodium chloride, acetaminophen **OR** acetaminophen, azelastine, benzonatate, hydrocortisone, ondansetron **OR** ondansetron (ZOFRAN) IV, oxyCODONE, sodium chloride flush, traZODone  Antimicrobials: Anti-infectives (From admission, onward)   None       I have personally reviewed the following labs and images: CBC: Recent Labs  Lab 06/10/19 1736 06/11/19 0455  WBC 7.7 8.2  HGB 11.0* 10.2*  HCT 36.5 32.8*  MCV 94.8 95.1  PLT 218 210   BMP &GFR Recent Labs  Lab 06/10/19 1736 06/11/19 0455 06/11/19 0909  NA 140 141  --   K 3.9 4.5  --   CL 99 102  --   CO2 31 29  --   GLUCOSE 201* 143*  --   BUN 26* 36*  --   CREATININE 2.99* 3.63*  --   CALCIUM 8.4* 8.5*  --   PHOS  --   --  4.8*   Estimated Creatinine Clearance: 17.6 mL/min (A) (by C-G formula based on SCr of 3.63 mg/dL (H)). Liver & Pancreas: No results for input(s): AST, ALT, ALKPHOS, BILITOT, PROT, ALBUMIN in the last 168 hours. No results for input(s): LIPASE, AMYLASE in  the last 168 hours. No results for input(s): AMMONIA in the last 168 hours. Diabetic: No results for input(s): HGBA1C in the last 72 hours. Recent Labs  Lab 06/11/19 1215 06/11/19 1937 06/11/19 2059 06/12/19 0818 06/12/19 1347  GLUCAP 165* 116* 167* 181* 103*   Cardiac Enzymes: No results for input(s): CKTOTAL, CKMB, CKMBINDEX, TROPONINI in the last 168 hours. No results for input(s): PROBNP in the last 8760 hours. Coagulation Profile: No results for input(s): INR, PROTIME in the last 168 hours. Thyroid Function Tests: Recent Labs    06/10/19 2041  TSH 3.091   Lipid Profile: No results for input(s): CHOL, HDL, LDLCALC, TRIG, CHOLHDL, LDLDIRECT in the last 72 hours. Anemia Panel: No results for input(s): VITAMINB12, FOLATE, FERRITIN, TIBC, IRON, RETICCTPCT in the last 72 hours. Urine analysis:    Component Value Date/Time   COLORURINE YELLOW (  A) 06/02/2019 2016   APPEARANCEUR CLEAR (A) 06/02/2019 2016   APPEARANCEUR Hazy 05/08/2013 1410   LABSPEC 1.005 06/02/2019 2016   LABSPEC 1.006 05/08/2013 1410   PHURINE 6.0 06/02/2019 2016   GLUCOSEU NEGATIVE 06/02/2019 2016   GLUCOSEU 50 mg/dL 05/08/2013 1410   HGBUR NEGATIVE 06/02/2019 2016   BILIRUBINUR NEGATIVE 06/02/2019 2016   BILIRUBINUR Negative 05/08/2013 [place] NEGATIVE 06/02/2019 2016   PROTEINUR NEGATIVE 06/02/2019 2016   NITRITE NEGATIVE 06/02/2019 2016   LEUKOCYTESUR TRACE (A) 06/02/2019 2016   LEUKOCYTESUR 1+ 05/08/2013 1410   Sepsis Labs: Invalid input(s): PROCALCITONIN, [place]  Microbiology: Recent Results (from the past 240 hour(s))  Respiratory Panel by RT PCR (Flu A&B, Covid) - Nasopharyngeal Swab     Status: None   Collection Time: 06/10/19  6:45 PM   Specimen: Nasopharyngeal Swab  Result Value Ref Range Status   SARS Coronavirus 2 by RT PCR NEGATIVE NEGATIVE Final    Comment: (NOTE) SARS-CoV-2 target nucleic acids are NOT DETECTED. The SARS-CoV-2 RNA is generally detectable in upper  respiratoy specimens during the acute phase of infection. The lowest concentration of SARS-CoV-2 viral copies this assay can detect is 131 copies/mL. A negative result does not preclude SARS-Cov-2 infection and should not be used as the sole basis for treatment or other patient management decisions. A negative result may occur with  improper specimen collection/handling, submission of specimen other than nasopharyngeal swab, presence of viral mutation(s) within the areas targeted by this assay, and inadequate number of viral copies (<131 copies/mL). A negative result must be combined with clinical observations, patient history, and epidemiological information. The expected result is Negative. Fact Sheet for Patients:  PinkCheek.be Fact Sheet for Healthcare Providers:  GravelBags.it This test is not yet ap proved or cleared by the [place] FDA and  has been authorized for detection and/or diagnosis of SARS-CoV-2 by FDA under an Emergency Use Authorization (EUA). This EUA will remain  in effect (meaning this test can be used) for the duration of the COVID-19 declaration under Section 564(b)(1) of the Act, 21 U.S.C. section 360bbb-3(b)(1), unless the authorization is terminated or revoked sooner.    Influenza A by PCR NEGATIVE NEGATIVE Final   Influenza B by PCR NEGATIVE NEGATIVE Final    Comment: (NOTE) The Xpert Xpress SARS-CoV-2/FLU/RSV assay is intended as an aid in  the diagnosis of influenza from Nasopharyngeal swab specimens and  should not be used as a sole basis for treatment. Nasal washings and  aspirates are unacceptable for Xpert Xpress SARS-CoV-2/FLU/RSV  testing. Fact Sheet for Patients: PinkCheek.be Fact Sheet for Healthcare Providers: GravelBags.it This test is not yet approved or cleared by the [place] FDA and  has been authorized for  detection and/or diagnosis of SARS-CoV-2 by  FDA under an Emergency Use Authorization (EUA). This EUA will remain  in effect (meaning this test can be used) for the duration of the  Covid-19 declaration under Section 564(b)(1) of the Act, 21  U.S.C. section 360bbb-3(b)(1), unless the authorization is  terminated or revoked. Performed at [facility], [address], [place], [place] [address]     Radiology Studies: No results found.    Delisha Peaden T. [place]  If 7PM-7AM, please contact night-coverage [email or web address] Password [facility] 06/12/2019, 2:47 PM

## 2019-06-13 LAB — RENAL FUNCTION PANEL
Albumin: 3.3 g/dL — ABNORMAL LOW (ref 3.5–5.0)
Anion gap: 9 (ref 5–15)
BUN: 27 mg/dL — ABNORMAL HIGH (ref 8–23)
CO2: 31 mmol/L (ref 22–32)
Calcium: 8.4 mg/dL — ABNORMAL LOW (ref 8.9–10.3)
Chloride: 93 mmol/L — ABNORMAL LOW (ref 98–111)
Creatinine, Ser: 2.79 mg/dL — ABNORMAL HIGH (ref 0.44–1.00)
GFR calc Af Amer: 19 mL/min — ABNORMAL LOW (ref >60)
GFR calc non Af Amer: 17 mL/min — ABNORMAL LOW (ref >60)
Glucose, Bld: 154 mg/dL — ABNORMAL HIGH (ref 70–99)
Phosphorus: 4.4 mg/dL (ref 2.5–4.6)
Potassium: 4 mmol/L (ref 3.5–5.1)
Sodium: 133 mmol/L — ABNORMAL LOW (ref 135–145)

## 2019-06-13 LAB — GLUCOSE, CAPILLARY
Glucose-Capillary: 125 mg/dL — ABNORMAL HIGH (ref 70–99)
Glucose-Capillary: 160 mg/dL — ABNORMAL HIGH (ref 70–99)
Glucose-Capillary: 103 mg/dL — ABNORMAL HIGH (ref 70–99)
Glucose-Capillary: 146 mg/dL — ABNORMAL HIGH (ref 70–99)

## 2019-06-13 LAB — CBC
HCT: 35.4 % — ABNORMAL LOW (ref 36.0–46.0)
Hemoglobin: 10.8 g/dL — ABNORMAL LOW (ref 12.0–15.0)
MCH: 29.2 pg (ref 26.0–34.0)
MCHC: 30.5 g/dL (ref 30.0–36.0)
MCV: 95.7 fL (ref 80.0–100.0)
Platelets: 224 10*3/uL (ref 150–400)
RBC: 3.7 MIL/uL — ABNORMAL LOW (ref 3.87–5.11)
RDW: 14.7 % (ref 11.5–15.5)
WBC: 8.4 10*3/uL (ref 4.0–10.5)
nRBC: 0 % (ref 0.0–0.2)

## 2019-06-13 LAB — HEMOGLOBIN A1C
Hgb A1c MFr Bld: 6.6 % — ABNORMAL HIGH (ref 4.8–5.6)
Mean Plasma Glucose: 142.72 mg/dL

## 2019-06-13 LAB — BRAIN NATRIURETIC PEPTIDE: B Natriuretic Peptide: 265 pg/mL — ABNORMAL HIGH (ref 0.0–100.0)

## 2019-06-13 LAB — MAGNESIUM: Magnesium: 2 mg/dL (ref 1.7–2.4)

## 2019-06-13 NOTE — Evaluation (Signed)
Occupational Therapy Evaluation Patient Details Name: [name] MRN: [id number] DOB: 1950-06-25 Today's Date: 06/13/2019    History of Present Illness [name] is a 69 year old female with history of ESRD on HD TTS, A. fib on Eliquis, systolic CHF, HTN and DM-2 who presented with progressive shortness of breath and hypoxemia, and admitted for acute hypoxemic respiratory failure thought to be due to fluid overload in the setting of ESRD and acute on chronic systolic CHF.    Clinical Impression   [name] was seen for OT evaluation this date. Pt was MOD I using RW at baseline and MOD I for ADLs. Pt lives in a 1 story home on no O2 at baseline. Pt reports becoming easily fatigued or out of breath with minimal exertion. Pt presents to acute OT demonstrating impaired ADL performance and functional mobility 2/2 decreased activity tolerance and decreased fine motor coordination.  Pt currently requires SETUP and SUP tooth brushing seated EOB and MOD I for LB access long sitting in bed. Pt required SUP sit<>stand at EOB but fatigued after ~1 min standing. SpO2 stable on 2L [place] t/o ADL tasks. Pt educated in energy conservation strategies including pursed lip breathing, activity pacing, home/routines modifications, work simplification, AE/DME, prioritizing of meaningful occupations, and falls prevention. Handout and IS provided. Pt verbalized understanding and would benefit from additional skilled OT services to maximize recall and carryover of learned techniques and facilitate implementation of learned techniques into daily routines. Upon discharge, recommend [place] services.       Follow Up Recommendations  Home health OT;Supervision - Intermittent    Equipment Recommendations       Recommendations for Other Services       Precautions / Restrictions Precautions Precautions: None Restrictions Weight Bearing Restrictions: No      Mobility Bed Mobility Overal bed mobility: Modified  Independent             General bed mobility comments: HOB elevated   Transfers Overall transfer level: Modified independent               General transfer comment: MOD I sit<>stand at EOB, pt fatigued after ~1 min standing (2L [place], SpO2 94%)    Balance Overall balance assessment: Needs assistance Sitting-balance support: Feet supported;Bilateral upper extremity supported Sitting balance-Leahy Scale: Good     Standing balance support: No upper extremity supported Standing balance-Leahy Scale: Fair                             ADL either performed or assessed with clinical judgement   ADL Overall ADL's : Needs assistance/impaired                                       General ADL Comments: SETUP and SUP tooth brushing seated EOB. MOD I LB access long sitting in bed.      Vision Baseline Vision/History: Wears glasses Wears Glasses: Reading only       Perception     Praxis      Pertinent Vitals/Pain Pain Assessment: No/denies pain     Hand Dominance Right   Extremity/Trunk Assessment Upper Extremity Assessment Upper Extremity Assessment: Generalized weakness   Lower Extremity Assessment Lower Extremity Assessment: Generalized weakness       Communication Communication Communication: No difficulties   Cognition Arousal/Alertness: Awake/alert Behavior During Therapy: WFL for tasks assessed/performed Overall Cognitive  Status: Within Functional Limits for tasks assessed                                     General Comments  Seated EOB: SpO2 94% on 2L [place]. Standing trial: SpO2 94% on 2L [place]. Following ~5 mins sitting grooming ADL c O2 removed for face washing: HR 102, SpO2 90%, resolved to 94% c PLB and [place] replaced.     Exercises Exercises: Other exercises Other Exercises Other Exercises: Pt educated re: falls prevention, DME recommendatins, d/c recommendations, energy conservation (handout), IS use and  frequnecy (provided)  Other Exercises: bed mobility, sup<>sit, sit<>stand, sitting balance/tolerance, tooth brushing, face washing   Shoulder Instructions      Home Living Family/patient expects to be discharged to:: Private residence Living Arrangements: Other relatives(sister) Available Help at Discharge: Family Type of Home: House Home Access: Stairs to enter [employer] of Steps: 1   Home Layout: One level     Bathroom Shower/Tub: Teacher, early years/pre: Standard     Home Equipment: Environmental consultant - 4 wheels;Cane - single point;Shower seat;Transport chair          Prior Functioning/Environment Level of Independence: Independent with assistive device(s)        Comments: Pt uses RW at baseline and no O2. Assist from family for IADLs, pt reports sitting for I/ADLs PRN        OT Problem List: Decreased strength;Decreased activity tolerance      OT Treatment/Interventions: Self-care/ADL training;Therapeutic exercise;Neuromuscular education;Energy conservation;DME and/or AE instruction;Therapeutic activities;Patient/family education;Balance training    OT Goals(Current goals can be found in the care plan section) Acute Rehab OT Goals Patient Stated Goal: to breathe better OT Goal Formulation: With patient Time For Goal Achievement: 06/27/19 Potential to Achieve Goals: Good ADL Goals Pt Will Perform Grooming: with modified independence;sitting Pt Will Perform Lower Body Dressing: with modified independence;sit to/from stand(c LRAD PRN) Pt Will Transfer to Toilet: with supervision;ambulating;bedside commode(c LRAD PRN) Additional ADL Goal #1: Pt will independently verbalize plant to implement x3 energy conservation strategies.  OT Frequency: Min 2X/week   Barriers to D/C: Inaccessible home environment          Co-evaluation              AM-PAC OT "6 Clicks" Daily Activity     Outcome Measure Help from another person eating meals?:  None Help from another person taking care of personal grooming?: None Help from another person toileting, which includes using toliet, bedpan, or urinal?: A Little Help from another person bathing (including washing, rinsing, drying)?: A Little Help from another person to put on and taking off regular upper body clothing?: None Help from another person to put on and taking off regular lower body clothing?: A Little 6 Click Score: 21   End of Session Equipment Utilized During Treatment: Oxygen(2L [place])  Activity Tolerance: Patient tolerated treatment well Patient left: in bed;with bed alarm set  OT Visit Diagnosis: Unsteadiness on feet (R26.81);Other abnormalities of gait and mobility (R26.89)                Time: 9357-0177 OT Time Calculation (min): 22 min Charges:  OT General Charges $OT Visit: 1 Visit OT Evaluation $OT Eval Low Complexity: 1 Low OT Treatments $Self Care/Home Management : 8-22 mins  Dessie Coma, M.S. OTR/L  06/13/19, 1:59 PM

## 2019-06-13 NOTE — Progress Notes (Signed)
[facility]  SUBJECTIVE: Patient laying flat in bed, reports improved breathing following dialysis, on O2 by nasal cannula   Vitals:   06/13/19 0845 06/13/19 0846 06/13/19 1116 06/13/19 1245  BP:  (!) 90/49 (!) 88/66 (!) 89/63  Pulse:  87 (!) 140 87  Resp:      Temp:   98.4 F (36.9 C)   TempSrc:   Oral   SpO2: (!) 85%  97%   Weight:      Height:         Intake/Output Summary (Last 24 hours) at 06/13/2019 1311 Last data filed at 06/13/2019 0945 Gross per 24 hour  Intake 502.51 ml  Output 1000 ml  Net -497.49 ml      PHYSICAL EXAM  General: Well developed, well nourished, in no acute distress HEENT:  Normocephalic and atramatic Neck:  No JVD.  Lungs: Clear bilaterally to auscultation and percussion. Heart: HRRR . Normal S1 and S2 without gallops or murmurs.  Abdomen: Bowel sounds are positive, abdomen soft and non-tender  Msk:  Back normal, normal gait. Normal strength and tone for age. Extremities: No clubbing, cyanosis or edema.   Neuro: Alert and oriented X 3. Psych:  Good affect, responds appropriately   LABS: Basic Metabolic Panel: Recent Labs    06/11/19 0455 06/11/19 0909 06/13/19 0521  NA 141  --  133*  K 4.5  --  4.0  CL 102  --  93*  CO2 29  --  31  GLUCOSE 143*  --  154*  BUN 36*  --  27*  CREATININE 3.63*  --  2.79*  CALCIUM 8.5*  --  8.4*  MG  --   --  2.0  PHOS  --  4.8* 4.4   Liver Function Tests: Recent Labs    06/13/19 0521  ALBUMIN 3.3*   No results for input(s): LIPASE, AMYLASE in the last 72 hours. CBC: Recent Labs    06/11/19 0455 06/13/19 0521  WBC 8.2 8.4  HGB 10.2* 10.8*  HCT 32.8* 35.4*  MCV 95.1 95.7  PLT 210 224   Cardiac Enzymes: No results for input(s): CKTOTAL, CKMB, CKMBINDEX, TROPONINI in the last 72 hours. BNP: Invalid input(s): POCBNP D-Dimer: No results for input(s): DDIMER in the last 72 hours. Hemoglobin A1C: Recent Labs    06/13/19 0521  HGBA1C 6.6*   Fasting Lipid Panel: No results for  input(s): CHOL, HDL, LDLCALC, TRIG, CHOLHDL, LDLDIRECT in the last 72 hours. Thyroid Function Tests: Recent Labs    06/10/19 2041  TSH 3.091   Anemia Panel: No results for input(s): VITAMINB12, FOLATE, FERRITIN, TIBC, IRON, RETICCTPCT in the last 72 hours.  No results found.   Echo LVEF 45% with normally functioning aortic prosthesis status post TAVR by most recent 2D echocardiogram 12/21/2018  TELEMETRY: Sinus rhythm at 61 bpm:  ASSESSMENT AND PLAN:  Active Problems:   Benign essential HTN   End stage renal disease (HCC)   Diabetic peripheral neuropathy (HCC)   Paroxysmal A-fib (HCC)   DM type 2 with diabetic peripheral neuropathy (HCC)   Hypoxia    1.  Respiratory failure, likely multifactorial, secondary to acute on chronic systolic congestive heart failure, fluid retention with ESRD on hemodialysis, OSA, slowly improving following dialysis Tuesday, Thursday, Saturday 2.  Acute on chronic systolic congestive heart failure, with mild reduced left ventricular function, LVEF 45% by echocardiogram 12/21/2018 3.  Paroxysmal atrial fibrillation, currently in sinus rhythm, on Eliquis for stroke prevention, and metoprolol succinate and amiodarone for rate  and rhythm control 4.  Essential hypertension, blood pressure low normal 5.  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  ESRD on hemodialysis Tuesday, Thursday, Saturday  Recommendations  1.  Agree with current therapy 2.  Continue diuresis 3.  Transition to p.o. furosemide per nephrology 4.  Continue Eliquis for stroke prevention 5.  Continue metoprolol succinate and amiodarone for rate and rhythm control 6.  No further recommendations at this time 7.  Follow-up with Dr. Nehemiah Massed as outpatient   Isaias Cowman, MD, PhD, [facility] 06/13/2019 1:11 PM

## 2019-06-13 NOTE — Evaluation (Signed)
Physical Therapy Evaluation Patient Details Name: [name] MRN: [id number] DOB: 29-Jun-1950 Today's Date: 06/13/2019   History of Present Illness  69 year old female with history of ESRD on HD TTS, A. fib on Eliquis, systolic CHF, HTN, TAVR, atherosclerosis, aortic valve stenosis, anemia, anxiety, DM-2 who presented with progressive shortness of breath and hypoxemia, and admitted for acute hypoxic respiratory failure due to fluid overload in the setting of ESRD and acute on chronic systolic CHF.  Had cardiomegaly, pulm HTN, R lung opacities  Clinical Impression  Pt was seen for mobility on RW after monitoring BP and O2 sats on cannula with 2L O2.  BP supine was 83/52, pulse 59 and sat 100%; sitting 96/52, pulse 62 and sat 99%;  Standing BP 95/66, pulse 70 and sat 95%.  Follow acutely for progression of mobility and focus on safety with balance standing and walking.  Noted sats after walk on room air were 86% so reapplied cannula and will retry this as is needed.  The values initially supported but then were down.    Follow Up Recommendations Home health PT;Supervision for mobility/OOB    Equipment Recommendations  Rolling walker with 5" wheels    Recommendations for Other Services       Precautions / Restrictions Precautions Precautions: Fall Precaution Comments: monitor O2 sats Restrictions Weight Bearing Restrictions: No      Mobility  Bed Mobility Overal bed mobility: Modified Independent             General bed mobility comments: extra time and bed rail used  Transfers Overall transfer level: Modified independent Equipment used: Rolling walker (2 wheeled)             General transfer comment: stands with more than one attempt needed  Ambulation/Gait Ambulation/Gait assistance: Min guard Gait Distance (Feet): 90 Feet(30 + 60) Assistive device: Rolling walker (2 wheeled);1 person hand held assist Gait Pattern/deviations: Step-through pattern;Wide base of  support;Trunk flexed;Drifts right/left Gait velocity: reduced Gait velocity interpretation: <1.31 ft/sec, indicative of household ambulator General Gait Details: pt is able to pivot on walker with extra time  Stairs            Wheelchair Mobility    Modified Rankin (Stroke Patients Only)       Balance Overall balance assessment: Needs assistance Sitting-balance support: Feet supported Sitting balance-Leahy Scale: Good     Standing balance support: Bilateral upper extremity supported;During functional activity Standing balance-Leahy Scale: Fair                               Pertinent Vitals/Pain Pain Assessment: No/denies pain    Home Living Family/patient expects to be discharged to:: Private residence Living Arrangements: Other relatives(sister) Available Help at Discharge: Family Type of Home: House Home Access: Stairs to enter   [employer] of Steps: 1 Home Layout: One level Home Equipment: [place] [employer] - 4 wheels;Cane - single point;Shower seat;Transport chair Additional Comments: pt lives with sister, mother has passed on    Prior Function Level of Independence: Independent with assistive device(s)         Comments: Pt uses RW at baseline and no O2. Assist from family for IADLs, pt reports sitting for I/ADLs PRN     Hand Dominance   Dominant Hand: Right    Extremity/Trunk Assessment   Upper Extremity Assessment Upper Extremity Assessment: Generalized weakness    Lower Extremity Assessment Lower Extremity Assessment: Generalized weakness    Cervical /  Trunk Assessment Cervical / Trunk Assessment: Kyphotic  Communication   Communication: No difficulties  Cognition Arousal/Alertness: Awake/alert Behavior During Therapy: WFL for tasks assessed/performed Overall Cognitive Status: Within Functional Limits for tasks assessed                                        General Comments General comments (skin  integrity, edema, etc.): BP and pulses, sats were observed since pt was light headed earlier with OT    Exercises Other Exercises Other Exercises: Pt educated re: falls prevention, DME recommendatins, d/c recommendations, energy conservation (handout), IS use and frequnecy (provided)  Other Exercises: bed mobility, sup<>sit, sit<>stand, sitting balance/tolerance, tooth brushing, face washing   Assessment/Plan    PT Assessment Patient needs continued PT services  PT Problem List Decreased strength;Decreased range of motion;Decreased activity tolerance;Decreased balance;Decreased mobility;Cardiopulmonary status limiting activity       PT Treatment Interventions DME instruction;Gait training;Functional mobility training;Therapeutic activities;Therapeutic exercise;Balance training;Neuromuscular re-education;Patient/family education;Stair training    PT Goals (Current goals can be found in the Care Plan section)  Acute Rehab PT Goals Patient Stated Goal: to get stronger PT Goal Formulation: With patient Time For Goal Achievement: 06/20/19 Potential to Achieve Goals: Good    Frequency Min 2X/week   Barriers to discharge Decreased caregiver support home with sister    Co-evaluation               AM-PAC PT "6 Clicks" Mobility  Outcome Measure Help needed turning from your back to your side while in a flat bed without using bedrails?: None Help needed moving from lying on your back to sitting on the side of a flat bed without using bedrails?: A Little Help needed moving to and from a bed to a chair (including a wheelchair)?: A Little Help needed standing up from a chair using your arms (e.g., wheelchair or bedside chair)?: A Little Help needed to walk in hospital room?: A Little Help needed climbing 3-5 steps with a railing? : A Lot 6 Click Score: 18    End of Session Equipment Utilized During Treatment: Gait belt;Oxygen Activity Tolerance: Patient tolerated treatment  well;Treatment limited secondary to medical complications (Comment) Patient left: in bed;with call bell/phone within reach;with bed alarm set Nurse Communication: Mobility status PT Visit Diagnosis: Unsteadiness on feet (R26.81);Muscle weakness (generalized) (M62.81);Dizziness and giddiness (R42)    Time: 1435-1510 PT Time Calculation (min) (ACUTE ONLY): 35 min   Charges:   PT Evaluation $PT Eval Moderate Complexity: 1 Mod PT Treatments $Gait Training: 8-22 mins       Ramond Dial 06/13/2019, 3:55 PM  Mee Hives, PT MS Acute Rehab Dept. Number: [place]

## 2019-06-13 NOTE — Progress Notes (Signed)
I have reviewed the charting completed by the Student-RN and I agree with his assessments.  Yvonne Stopher A Tayo Maute, RN 

## 2019-06-13 NOTE — Progress Notes (Signed)
PT Cancellation Note  Patient Details Name: [name] MRN: [id number] DOB: [birth year]   Cancelled Treatment:    Reason Eval/Treat Not Completed: Medical issues which prohibited therapy.  Pt had red MEWS, will re-attempt at a later time.   Ramond Dial 06/13/2019, 11:41 AM  Mee Hives, PT MS Acute Rehab Dept. Number: [place]

## 2019-06-13 NOTE — Progress Notes (Signed)
[name]  MRN: [id number]  DOB/AGE: 69-Mar-1952 69 y.o.  Primary Care Physician:Klein, Tonette Bihari, MD  Admit date: 06/10/2019  Chief Complaint:  Chief Complaint  Patient presents with  . Abnormal Lab    S-Pt presented on  06/10/2019 with  Chief Complaint  Patient presents with  . Abnormal Lab  . Patient offers no new complaints  Medications . amiodarone  200 mg Oral Daily  . apixaban  5 mg Oral BID  . atorvastatin  40 mg Oral Daily  . Chlorhexidine Gluconate Cloth  6 each Topical Q0600  . DULoxetine  30 mg Oral Daily  . feeding supplement (NEPRO CARB STEADY)  237 mL Oral BID BM  . feeding supplement (PRO-STAT SUGAR FREE 64)  30 mL Oral BID  . furosemide  40 mg Intravenous Q12H  . insulin aspart  6 Units Subcutaneous TID WC  . insulin glargine  45 Units Subcutaneous QHS  . irbesartan  75 mg Oral Daily  . levothyroxine  88 mcg Oral QAC breakfast  . metoprolol succinate  25 mg Oral BID  . multivitamin  1 tablet Oral QHS  . sodium chloride flush  3 mL Intravenous Q12H  . [START ON 06/15/2019] Vitamin D (Ergocalciferol)  50,000 Units Oral Q Tue         [id number] from the symptoms mentioned above,there are no other symptoms referable to all systems reviewed.  Physical Exam: Vital signs in last 24 hours: Temp:  [98 F (36.7 C)-98.5 F (36.9 C)] 98.5 F (36.9 C) (04/25 0744) Pulse Rate:  [55-123] 92 (04/25 0744) Resp:  [13-23] 17 (04/25 0333) BP: (79-134)/(48-87) 84/55 (04/25 0744) SpO2:  [93 %-100 %] 93 % (04/25 0744) Weight:  [112.9 kg-113.4 kg] 112.9 kg (04/25 0333) Weight change: 0.816 kg Last BM Date: 06/11/19  Intake/Output from previous day: 04/24 0701 - 04/25 0700 In: 502.5 [P.O.:480; IV Piggyback:22.5] Out: 1000  No intake/output data recorded.   Physical Exam: General- pt is awake,alert, oriented to time place and person Resp- No acute REsp distress, CTA B/L NO Rhonchi CVS- S1S2 regular in rate and rhythm GIT- BS+, soft, NT, ND EXT- NO LE  Edema, Cyanosis Access patient has tunneled catheter as well as fistula in situ  Lab Results: CBC Recent Labs    06/11/19 0455 06/13/19 0521  WBC 8.2 8.4  HGB 10.2* 10.8*  HCT 32.8* 35.4*  PLT 210 224    BMET Recent Labs    06/11/19 0455 06/13/19 0521  NA 141 133*  K 4.5 4.0  CL 102 93*  CO2 29 31  GLUCOSE 143* 154*  BUN 36* 27*  CREATININE 3.63* 2.79*  CALCIUM 8.5* 8.4*    MICRO Recent Results (from the past 240 hour(s))  Respiratory Panel by RT PCR (Flu A&B, Covid) - Nasopharyngeal Swab     Status: None   Collection Time: 06/10/19  6:45 PM   Specimen: Nasopharyngeal Swab  Result Value Ref Range Status   SARS Coronavirus 2 by RT PCR NEGATIVE NEGATIVE Final    Comment: (NOTE) SARS-CoV-2 target nucleic acids are NOT DETECTED. The SARS-CoV-2 RNA is generally detectable in upper respiratoy specimens during the acute phase of infection. The lowest concentration of SARS-CoV-2 viral copies this assay can detect is 131 copies/mL. A negative result does not preclude SARS-Cov-2 infection and should not be used as the sole basis for treatment or other patient management decisions. A negative result may occur with  improper specimen collection/handling, submission of specimen other than nasopharyngeal swab, presence  of viral mutation(s) within the areas targeted by this assay, and inadequate number of viral copies (<131 copies/mL). A negative result must be combined with clinical observations, patient history, and epidemiological information. The expected result is Negative. Fact Sheet for Patients:  PinkCheek.be Fact Sheet for Healthcare Providers:  GravelBags.it This test is not yet ap proved or cleared by the [place] FDA and  has been authorized for detection and/or diagnosis of SARS-CoV-2 by FDA under an Emergency Use Authorization (EUA). This EUA will remain  in effect (meaning this test can be used)  for the duration of the COVID-19 declaration under Section 564(b)(1) of the Act, 21 U.S.C. section 360bbb-3(b)(1), unless the authorization is terminated or revoked sooner.    Influenza A by PCR NEGATIVE NEGATIVE Final   Influenza B by PCR NEGATIVE NEGATIVE Final    Comment: (NOTE) The Xpert Xpress SARS-CoV-2/FLU/RSV assay is intended as an aid in  the diagnosis of influenza from Nasopharyngeal swab specimens and  should not be used as a sole basis for treatment. Nasal washings and  aspirates are unacceptable for Xpert Xpress SARS-CoV-2/FLU/RSV  testing. Fact Sheet for Patients: PinkCheek.be Fact Sheet for Healthcare Providers: GravelBags.it This test is not yet approved or cleared by the [place] FDA and  has been authorized for detection and/or diagnosis of SARS-CoV-2 by  FDA under an Emergency Use Authorization (EUA). This EUA will remain  in effect (meaning this test can be used) for the duration of the  Covid-19 declaration under Section 564(b)(1) of the Act, 21  U.S.C. section 360bbb-3(b)(1), unless the authorization is  terminated or revoked. Performed at [facility], [address] [place], [place] [address]       Lab Results  Component Value Date   PTH 202 (H) 06/11/2019   CALCIUM 8.4 (L) 06/13/2019   CAION 1.14 (L) 01/29/2019   PHOS 4.4 06/13/2019               Impression:   Patient is a 69 year old female with a past medical history of end-stage renal disease on hemodialysis-Tuesday Thursday Saturday schedule, atrial fibrillation on Eliquis and amiodarone, CHF, hypertension, hypothyroidism, diabetes mellitus type 2 who came to the ER with chief complaint of shortness of breath. Patient was admitted with acute hypoxic respiratory failure secondary to fluid overload, ESRD, proximal A. fib with rapid ventricular rate  1)Renal end-stage renal disease. Patient is on  hemodialysis. Patient is on Tuesday Thursday Saturday schedule. Patient was last dialyzed yesterday  2) hypotension  Patient blood pressure is now better   3)Anemia of chronic disease  HGb at goal (9--11)   4) secondary hyperparathyroidism -CKD Mineral-Bone Disorder   Secondary Hyperparathyroidism present .  Phosphorus at goal.   5) acute on chronic CHF Patient chest x-ray this information showed vascular congestion Patient has now undergone fluid removal-patient is 3 L negative Patient is now clinically better Patient is being followed by cardiology and the primary team  6) electrolytes   sodium Normonatremic   potassium Normokalemic    7)Acid base Co2 at goal  8) proximal atrial fibrillation Patient is on Eliquis, metoprolol and amiodarone. Patient is being closely followed with the primary team and cardiology   Plan:   No need for hemodialysis today    Ronne Savoia [facility] 06/13/2019, 8:04 AM

## 2019-06-13 NOTE — Progress Notes (Signed)
Patient ambulated to bathroom on room air. Noted DOE. On RA O2 sat 85%.  Patient placed back on 2L [place] when back to bed.  Dr. Cyndia Skeeters made aware. No new orders received.

## 2019-06-13 NOTE — Progress Notes (Signed)
PROGRESS NOTE  [name] TIR:[id number] DOB: 1950/08/06   PCP: Adin Hector, MD  Patient is from: Home.  Uses walker at baseline  DOA: 06/10/2019 LOS: 2  Brief Narrative / Interim history: 69 year old female with history of ESRD on HD TTS, A. fib on Eliquis, systolic CHF, HTN and DM-2 who presented with progressive shortness of breath and hypoxemia, and admitted for acute hypoxemic respiratory failure thought to be due to fluid overload in the setting of ESRD and acute on chronic systolic CHF.  Reportedly saturating at 88% on RA on arrival.  CXR with cardiomegaly and pulmonary congestion.  COVID-19 and influenza A/B PCR negative.  BNP 494. HS Trop 45> 41.   Nephrology and cardiology consulted.  Started on IV Lasix and hemodialysis.  Subjective: Seen and examined earlier this morning.  Soft blood pressure this morning but not symptomatic.  She denies chest pain, dyspnea, palpitation, dizziness, GI or UTI symptoms.  Objective: Vitals:   06/13/19 0830 06/13/19 0845 06/13/19 0846 06/13/19 1116  BP:   (!) 90/49 (!) 88/66  Pulse:   87 (!) 140  Resp:      Temp:    98.4 F (36.9 C)  TempSrc:    Oral  SpO2: 95% (!) 85%  97%  Weight:      Height:        Intake/Output Summary (Last 24 hours) at 06/13/2019 1140 Last data filed at 06/13/2019 0945 Gross per 24 hour  Intake 502.51 ml  Output 1000 ml  Net -497.49 ml   Filed Weights   06/12/19 0431 06/12/19 1900 06/13/19 0333  Weight: 112.5 kg 113.4 kg 112.9 kg    Examination:  GENERAL: No apparent distress.  Nontoxic. HEENT: MMM.  Vision and hearing grossly intact.  NECK: Supple.  No apparent JVD.  RESP: On 2 L by [place].  No IWOB.  Fair aeration bilaterally. CVS:  RRR. Heart sounds normal.  ABD/GI/GU: Bowel sounds present. Soft. Non tender.  MSK/EXT:  Moves extremities. No apparent deformity. No edema.  SKIN: no apparent skin lesion or wound NEURO: Awake, alert and oriented appropriately.  No apparent focal neuro  deficit. PSYCH: Calm. Normal affect.  Procedures:  None  Microbiology summarized: COVID-19 PCR negative. Influenza PCR negative.  Assessment & Plan: Acute respiratory failure with hypoxia-multifactorial including acute on chronic systolic CHF, pulmonary hypertension, ESRD and possible OSA/OHS.  Desaturated to 85% with ambulation on room air requiring 2 L.  She was also dyspneic. -Treat treatable causes as below. -Wean oxygen as able-not on oxygen at home. -Incentive spirometry and OOB  Acute on chronic systolic CHF: Echo in 67/6195 with EF of 45% (improved from prior). BNP higher than baseline.  CXR with vascular congestion. Had UF of 1 L yesterday.  UOP not captured.  Soft blood pressures but not symptomatic.  BNP 494> 265 -Continue IV Lasix and HD per nephrology -Discontinue losartan in the setting of soft blood pressures. -Monitor fluid status -Sodium and fluid restriction -Cardiology following?-We will clarify.  Severe aortic stenosis s/p TVAR in 08/2017 with normal functioning prosthetic valve -Per cardiology.  End-stage renal disease on HD TTS -Per nephrology  Paroxysmal A. fib with RVR: Now rate controlled. -Continue amiodarone, Toprol-XL and Eliquis.  Hypotension: Soft blood pressure this morning but not symptomatic. -Hold losartan -We will continue low-dose Toprol-XL  Hypothyroidism:  TSH 3.0. -Continue home Synthroid  DM-2: With hyperglycemia Recent Labs    06/12/19 2148 06/13/19 0743 06/13/19 1118  GLUCAP 200* 125* 160*  -Continue current regimen -  Follow A1c  Dyslipidemia. -Continue statin.  Morbid obesity:Body mass index is 44.1 kg/m. -Encourage lifestyle change to lose weight.  OSA with possible OHS: reportely couldn't tolerate CPAP due to panic attack -May benefit from outpatient sleep study.  Debility/physical deconditioning: Uses walker at baseline. -PT/OT eval pending.   Nutrition Problem: Increased nutrient needs Etiology: chronic  illness(ESRD on HD)  Signs/Symptoms: estimated needs  Interventions: Nepro shake, Prostat, MVI       DVT prophylaxis: On Eliquis for A. fib. Code Status: Full code Family Communication: Daughter did not pick up the phone.  Updated sister, Butch Penny over the phone.  Discharge barrier: Acute hypoxic respiratory failure requiring supplemental oxygen Patient is from: Home Final disposition: To be determined after therapy evaluation and clearance by consultants.  Likely in the next 24 to 48 hours.  Consultants:  Cardiology Nephrology   Sch Meds:  Scheduled Meds: . amiodarone  200 mg Oral Daily  . apixaban  5 mg Oral BID  . atorvastatin  40 mg Oral Daily  . Chlorhexidine Gluconate Cloth  6 each Topical Q0600  . DULoxetine  30 mg Oral Daily  . feeding supplement (NEPRO CARB STEADY)  237 mL Oral BID BM  . feeding supplement (PRO-STAT SUGAR FREE 64)  30 mL Oral BID  . furosemide  40 mg Intravenous Q12H  . insulin aspart  6 Units Subcutaneous TID WC  . insulin glargine  45 Units Subcutaneous QHS  . levothyroxine  88 mcg Oral QAC breakfast  . metoprolol succinate  25 mg Oral BID  . multivitamin  1 tablet Oral QHS  . sodium chloride flush  3 mL Intravenous Q12H  . [START ON 06/15/2019] Vitamin D (Ergocalciferol)  50,000 Units Oral Q Tue   Continuous Infusions: . sodium chloride    . albumin human Stopped (06/12/19 1900)   PRN Meds:.sodium chloride, acetaminophen **OR** acetaminophen, azelastine, benzonatate, hydrocortisone, ondansetron **OR** ondansetron (ZOFRAN) IV, oxyCODONE, sodium chloride flush, traZODone  Antimicrobials: Anti-infectives (From admission, onward)   None       I have personally reviewed the following labs and images: CBC: Recent Labs  Lab 06/10/19 1736 06/11/19 0455 06/13/19 0521  WBC 7.7 8.2 8.4  HGB 11.0* 10.2* 10.8*  HCT 36.5 32.8* 35.4*  MCV 94.8 95.1 95.7  PLT 218 210 224   BMP &GFR Recent Labs  Lab 06/10/19 1736 06/11/19 0455  06/11/19 0909 06/13/19 0521  NA 140 141  --  133*  K 3.9 4.5  --  4.0  CL 99 102  --  93*  CO2 31 29  --  31  GLUCOSE 201* 143*  --  154*  BUN 26* 36*  --  27*  CREATININE 2.99* 3.63*  --  2.79*  CALCIUM 8.4* 8.5*  --  8.4*  MG  --   --   --  2.0  PHOS  --   --  4.8* 4.4   Estimated Creatinine Clearance: 23 mL/min (A) (by C-G formula based on SCr of 2.79 mg/dL (H)). Liver & Pancreas: Recent Labs  Lab 06/13/19 0521  ALBUMIN 3.3*   No results for input(s): LIPASE, AMYLASE in the last 168 hours. No results for input(s): AMMONIA in the last 168 hours. Diabetic: No results for input(s): HGBA1C in the last 72 hours. Recent Labs  Lab 06/12/19 0818 06/12/19 1347 06/12/19 2148 06/13/19 0743 06/13/19 1118  GLUCAP 181* 103* 200* 125* 160*   Cardiac Enzymes: No results for input(s): CKTOTAL, CKMB, CKMBINDEX, TROPONINI in the last 168 hours. No results  for input(s): PROBNP in the last 8760 hours. Coagulation Profile: No results for input(s): INR, PROTIME in the last 168 hours. Thyroid Function Tests: Recent Labs    06/10/19 2041  TSH 3.091   Lipid Profile: No results for input(s): CHOL, HDL, LDLCALC, TRIG, CHOLHDL, LDLDIRECT in the last 72 hours. Anemia Panel: No results for input(s): VITAMINB12, FOLATE, FERRITIN, TIBC, IRON, RETICCTPCT in the last 72 hours. Urine analysis:    Component Value Date/Time   COLORURINE YELLOW (A) 06/02/2019 2016   APPEARANCEUR CLEAR (A) 06/02/2019 2016   APPEARANCEUR Hazy 05/08/2013 1410   LABSPEC 1.005 06/02/2019 2016   LABSPEC 1.006 05/08/2013 1410   PHURINE 6.0 06/02/2019 2016   GLUCOSEU NEGATIVE 06/02/2019 2016   GLUCOSEU 50 mg/dL 05/08/2013 1410   HGBUR NEGATIVE 06/02/2019 2016   BILIRUBINUR NEGATIVE 06/02/2019 2016   BILIRUBINUR Negative 05/08/2013 [place] NEGATIVE 06/02/2019 2016   PROTEINUR NEGATIVE 06/02/2019 2016   NITRITE NEGATIVE 06/02/2019 2016   LEUKOCYTESUR TRACE (A) 06/02/2019 2016   LEUKOCYTESUR 1+  05/08/2013 1410   Sepsis Labs: Invalid input(s): PROCALCITONIN, [place]  Microbiology: Recent Results (from the past 240 hour(s))  Respiratory Panel by RT PCR (Flu A&B, Covid) - Nasopharyngeal Swab     Status: None   Collection Time: 06/10/19  6:45 PM   Specimen: Nasopharyngeal Swab  Result Value Ref Range Status   SARS Coronavirus 2 by RT PCR NEGATIVE NEGATIVE Final    Comment: (NOTE) SARS-CoV-2 target nucleic acids are NOT DETECTED. The SARS-CoV-2 RNA is generally detectable in upper respiratoy specimens during the acute phase of infection. The lowest concentration of SARS-CoV-2 viral copies this assay can detect is 131 copies/mL. A negative result does not preclude SARS-Cov-2 infection and should not be used as the sole basis for treatment or other patient management decisions. A negative result may occur with  improper specimen collection/handling, submission of specimen other than nasopharyngeal swab, presence of viral mutation(s) within the areas targeted by this assay, and inadequate number of viral copies (<131 copies/mL). A negative result must be combined with clinical observations, patient history, and epidemiological information. The expected result is Negative. Fact Sheet for Patients:  PinkCheek.be Fact Sheet for Healthcare Providers:  GravelBags.it This test is not yet ap proved or cleared by the [place] FDA and  has been authorized for detection and/or diagnosis of SARS-CoV-2 by FDA under an Emergency Use Authorization (EUA). This EUA will remain  in effect (meaning this test can be used) for the duration of the COVID-19 declaration under Section 564(b)(1) of the Act, 21 U.S.C. section 360bbb-3(b)(1), unless the authorization is terminated or revoked sooner.    Influenza A by PCR NEGATIVE NEGATIVE Final   Influenza B by PCR NEGATIVE NEGATIVE Final    Comment: (NOTE) The Xpert Xpress  SARS-CoV-2/FLU/RSV assay is intended as an aid in  the diagnosis of influenza from Nasopharyngeal swab specimens and  should not be used as a sole basis for treatment. Nasal washings and  aspirates are unacceptable for Xpert Xpress SARS-CoV-2/FLU/RSV  testing. Fact Sheet for Patients: PinkCheek.be Fact Sheet for Healthcare Providers: GravelBags.it This test is not yet approved or cleared by the [place] FDA and  has been authorized for detection and/or diagnosis of SARS-CoV-2 by  FDA under an Emergency Use Authorization (EUA). This EUA will remain  in effect (meaning this test can be used) for the duration of the  Covid-19 declaration under Section 564(b)(1) of the Act, 21  U.S.C. section 360bbb-3(b)(1), unless the authorization is  terminated or revoked. Performed at [facility], [address] [place], [place] [address]     Radiology Studies: No results found.    Tiaja Hagan T. [place]  If 7PM-7AM, please contact night-coverage [email or web address] Password [facility] 06/13/2019, 11:40 AM

## 2019-06-14 DIAGNOSIS — I5043 Acute on chronic combined systolic (congestive) and diastolic (congestive) heart failure: Secondary | ICD-10-CM

## 2019-06-14 DIAGNOSIS — I350 Nonrheumatic aortic (valve) stenosis: Secondary | ICD-10-CM

## 2019-06-14 DIAGNOSIS — Z794 Long term (current) use of insulin: Secondary | ICD-10-CM

## 2019-06-14 DIAGNOSIS — E1165 Type 2 diabetes mellitus with hyperglycemia: Secondary | ICD-10-CM

## 2019-06-14 LAB — GLUCOSE, CAPILLARY
Glucose-Capillary: 136 mg/dL — ABNORMAL HIGH (ref 70–99)
Glucose-Capillary: 168 mg/dL — ABNORMAL HIGH (ref 70–99)

## 2019-06-14 MED ORDER — ACETAMINOPHEN 650 MG RE SUPP: 650.0000 mg | Freq: Four times a day (QID) | RECTAL | Status: DC | PRN

## 2019-06-14 MED ORDER — ACETAMINOPHEN 500 MG PO TABS: 1000.0000 mg | ORAL_TABLET | Freq: Three times a day (TID) | ORAL | Status: DC

## 2019-06-14 MED ORDER — ACETAMINOPHEN 500 MG PO TABS: 1000.0000 mg | ORAL_TABLET | Freq: Three times a day (TID) | ORAL | 0 refills | Status: DC

## 2019-06-14 MED ORDER — ACETAMINOPHEN 325 MG PO TABS: 650.0000 mg | ORAL_TABLET | Freq: Four times a day (QID) | ORAL | Status: DC | PRN

## 2019-06-14 NOTE — Progress Notes (Signed)
SATURATION QUALIFICATIONS: (This note is used to comply with regulatory documentation for home oxygen)  Patient Saturations on Room Air at Rest = 92%  Patient Saturations on Room Air while Ambulating = 85%  Patient Saturations on 2 Liters of oxygen at rest = 96%  Please briefly explain why patient needs home oxygen: noted decrease in oxygen sat and dyspnea on exertion

## 2019-06-14 NOTE — Progress Notes (Signed)
[name]  ROUNDING NOTE   Subjective:   Patient states her shortness of breath has improved. Patient states she is having "chest heaviness"  Objective:  Vital signs in last 24 hours:  Temp:  [97.8 F (36.6 C)-98.2 F (36.8 C)] 97.8 F (36.6 C) (04/26 1156) Pulse Rate:  [54-87] 58 (04/26 1156) Resp:  [17] 17 (04/26 1156) BP: (89-125)/(45-63) 125/57 (04/26 1156) SpO2:  [97 %-100 %] 98 % (04/26 1156) Weight:  [113.7 kg] 113.7 kg (04/26 0358)  Weight change: 0.363 kg Filed Weights   06/12/19 1900 06/13/19 0333 06/14/19 0358  Weight: 113.4 kg 112.9 kg 113.7 kg    Intake/Output: I/O last 3 completed shifts: In: 505.5 [P.O.:480; I.V.:3; IV Piggyback:22.5] Out: 150 [Urine:150]   Intake/Output this shift:  No intake/output data recorded.  Physical Exam: General: NAD,   Head: Normocephalic, atraumatic. Moist oral mucosal membranes  Eyes: Anicteric, PERRL  Neck: Supple, trachea midline  Lungs:  diminished at bases, +crackles  Heart: Regular rate and rhythm  Abdomen:  Soft, nontender,   Extremities:  no peripheral edema.  Neurologic: Nonfocal, moving all four extremities  Skin: No lesions  Access: Left AVF, RIJ permcath    Basic Metabolic Panel: Recent Labs  Lab 06/10/19 1736 06/11/19 0455 06/11/19 0909 06/13/19 0521  NA 140 141  --  133*  K 3.9 4.5  --  4.0  CL 99 102  --  93*  CO2 31 29  --  31  GLUCOSE 201* 143*  --  154*  BUN 26* 36*  --  27*  CREATININE 2.99* 3.63*  --  2.79*  CALCIUM 8.4* 8.5*  --  8.4*  MG  --   --   --  2.0  PHOS  --   --  4.8* 4.4    Liver Function Tests: Recent Labs  Lab 06/13/19 0521  ALBUMIN 3.3*   No results for input(s): LIPASE, AMYLASE in the last 168 hours. No results for input(s): AMMONIA in the last 168 hours.  CBC: Recent Labs  Lab 06/10/19 1736 06/11/19 0455 06/13/19 0521  WBC 7.7 8.2 8.4  HGB 11.0* 10.2* 10.8*  HCT 36.5 32.8* 35.4*  MCV 94.8 95.1 95.7  PLT 218 210 224    Cardiac  Enzymes: No results for input(s): CKTOTAL, CKMB, CKMBINDEX, TROPONINI in the last 168 hours.  BNP: Invalid input(s): POCBNP  CBG: Recent Labs  Lab 06/13/19 0743 06/13/19 1118 06/13/19 1636 06/13/19 2111 06/14/19 0759  GLUCAP 125* 160* 103* 146* 136*    Microbiology: Results for orders placed or performed during the hospital encounter of 06/10/19  Respiratory Panel by RT PCR (Flu A&B, Covid) - Nasopharyngeal Swab     Status: None   Collection Time: 06/10/19  6:45 PM   Specimen: Nasopharyngeal Swab  Result Value Ref Range Status   SARS Coronavirus 2 by RT PCR NEGATIVE NEGATIVE Final    Comment: (NOTE) SARS-CoV-2 target nucleic acids are NOT DETECTED. The SARS-CoV-2 RNA is generally detectable in upper respiratoy specimens during the acute phase of infection. The lowest concentration of SARS-CoV-2 viral copies this assay can detect is 131 copies/mL. A negative result does not preclude SARS-Cov-2 infection and should not be used as the sole basis for treatment or other patient management decisions. A negative result may occur with  improper specimen collection/handling, submission of specimen other than nasopharyngeal swab, presence of viral mutation(s) within the areas targeted by this assay, and inadequate number of viral copies (<131 copies/mL). A negative result must be combined  with clinical observations, patient history, and epidemiological information. The expected result is Negative. Fact Sheet for Patients:  PinkCheek.be Fact Sheet for Healthcare Providers:  GravelBags.it This test is not yet ap proved or cleared by the [place] FDA and  has been authorized for detection and/or diagnosis of SARS-CoV-2 by FDA under an Emergency Use Authorization (EUA). This EUA will remain  in effect (meaning this test can be used) for the duration of the COVID-19 declaration under Section 564(b)(1) of the Act, 21  U.S.C. section 360bbb-3(b)(1), unless the authorization is terminated or revoked sooner.    Influenza A by PCR NEGATIVE NEGATIVE Final   Influenza B by PCR NEGATIVE NEGATIVE Final    Comment: (NOTE) The Xpert Xpress SARS-CoV-2/FLU/RSV assay is intended as an aid in  the diagnosis of influenza from Nasopharyngeal swab specimens and  should not be used as a sole basis for treatment. Nasal washings and  aspirates are unacceptable for Xpert Xpress SARS-CoV-2/FLU/RSV  testing. Fact Sheet for Patients: PinkCheek.be Fact Sheet for Healthcare Providers: GravelBags.it This test is not yet approved or cleared by the [place] FDA and  has been authorized for detection and/or diagnosis of SARS-CoV-2 by  FDA under an Emergency Use Authorization (EUA). This EUA will remain  in effect (meaning this test can be used) for the duration of the  Covid-19 declaration under Section 564(b)(1) of the Act, 21  U.S.C. section 360bbb-3(b)(1), unless the authorization is  terminated or revoked. Performed at [facility], [address] [place], [place] [address]     Coagulation Studies: No results for input(s): LABPROT, INR in the last 72 hours.  Urinalysis: No results for input(s): COLORURINE, LABSPEC, PHURINE, GLUCOSEU, HGBUR, BILIRUBINUR, KETONESUR, PROTEINUR, UROBILINOGEN, NITRITE, LEUKOCYTESUR in the last 72 hours.  Invalid input(s): APPERANCEUR    Imaging: No results found.   Medications:   . sodium chloride    . albumin human Stopped (06/12/19 1900)   . acetaminophen  1,000 mg Oral Q8H  . amiodarone  200 mg Oral Daily  . apixaban  5 mg Oral BID  . atorvastatin  40 mg Oral Daily  . Chlorhexidine Gluconate Cloth  6 each Topical Q0600  . DULoxetine  30 mg Oral Daily  . feeding supplement (NEPRO CARB STEADY)  237 mL Oral BID BM  . feeding supplement (PRO-STAT SUGAR FREE 64)  30 mL Oral BID  . furosemide  40 mg  Intravenous Q12H  . insulin aspart  6 Units Subcutaneous TID WC  . insulin glargine  45 Units Subcutaneous QHS  . levothyroxine  88 mcg Oral QAC breakfast  . multivitamin  1 tablet Oral QHS  . sodium chloride flush  3 mL Intravenous Q12H  . [START ON 06/15/2019] Vitamin D (Ergocalciferol)  50,000 Units Oral Q Tue   sodium chloride, acetaminophen **OR** acetaminophen, azelastine, benzonatate, hydrocortisone, ondansetron **OR** ondansetron (ZOFRAN) IV, oxyCODONE, sodium chloride flush, traZODone  Assessment/ Plan:  [name] is a 69 y.o. black female with end stage renal disease on hemodialysis, atrial fibrillation on Eliquis and amiodarone, congestive heart failure, hypertension, hypothyroidism, diabetes mellitus type 2 who is admitted to [facility] on 06/10/2019 for DOE (dyspnea on exertion) [R06.00] Hypoxia [R09.02] Acute respiratory failure with hypoxia ([place]) [J96.01] ESRD on hemodialysis ([place]) [N18.6, Z99.2] Type 2 diabetes mellitus without complication, with long-term current use of insulin ([facility]) [E11.9, Z79.4] Atrial fibrillation, unspecified type ([place]) [I48.91]  [facility] TTS RIJ permcath/left AVF 115.5kg  1. End Stage Renal Disease:  - change estimated  target weight to 113.5kg.   2. Hypertension: with acute exacerbation of systolic congestive heart failure. Echocardiogram from 12/21/2018.  Home regimen of telmisartan, metoprolol, and amlodipine. Holding home agents.  - IV furosemide   3. Anemia of chronic kidney disease: hemoglobin 10.8.  - EPO with HD treatment.   4. Secondary Hyperparathyroidism: outpatient PTH, calcium and phosphorus at goal. Not currently on binders.    LOS: 3 Keveon Amsler 4/26/202112:07 PM

## 2019-06-14 NOTE — Discharge Summary (Signed)
Physician Discharge Summary  [name] UJW:[id number] DOB: 11/06/1950 DOA: 06/10/2019  PCP: Adin Hector, MD  Admit date: 06/10/2019 Discharge date: 06/14/2019  Admitted From: Home Disposition: Home  Recommendations for Outpatient Follow-up:  1. Follow ups as below. 2. Please obtain CBC/BMP/Mag at follow up 3. Please follow up on the following pending results: None  Home Health: PT/OT Equipment/Devices: Rolling walker, oxygen  Discharge Condition: Stable CODE STATUS: Full code  Follow-up Information    [facility] Follow up on 06/22/2019.   Specialty: Cardiology Why: at 2:00pm. Enter through the [facility] entrance Contact information: [address] [place] [phone]       Tama High III, MD. Schedule an appointment as soon as possible for a visit in 1 week(s).   Specialty: Internal Medicine Contact information: [place] [address] [phone]           Hospital Course: 69 year old female with history of ESRD on HD TTS, A. fib on Eliquis, systolic CHF, HTN and DM-2 who presented with progressive shortness of breath and hypoxemia, and admitted for acute hypoxemic respiratory failure thought to be due to fluid overload in the setting of ESRD and acute on chronic systolic CHF.  Reportedly saturating at 88% on RA on arrival.  CXR with cardiomegaly and pulmonary congestion.  COVID-19 and influenza A/B PCR negative.  BNP 494. HS Trop 45> 41.   Nephrology and cardiology consulted.  Started on IV Lasix and hemodialysis with ultrafiltration.  Cardiology recommended fluid management per nephrology.  Patient symptoms improved.  However, she desaturated to 85% when ambulated on room air requiring 2 L by nasal cannula to maintain appropriate saturation.  She was discharged on 2 L by nasal cannula with ambulation.  HH PT/OT and rolling walker ordered on discharge  per recommendation by PT/OT.  See individual problem list below for more hospital course.  Discharge Diagnoses:  Acute respiratory failure with hypoxia-multifactorial including acute on chronic systolic CHF, pulmonary hypertension, ESRD and possible OSA/OHS.  Desaturated to 85% with ambulation on room air requiring 2 L.  Discharged on 2 L by nasal cannula to be used with ambulation and at night. -Would benefit from sleep study outpatient.  Acute on chronic systolic CHF: Echo in 40/1027 with EF of 45% (improved from prior). BNP higher than baseline.  CXR with vascular congestion. UOP not captured.  Soft blood pressures but not symptomatic.  BNP 494> 265 -Fluid management with HD. -Discontinue telmisartan in the setting of soft blood pressures.  Severe aortic stenosis s/p TVAR in 08/2017 with normal functioning prosthetic valve -Per cardiology.  End-stage renal disease on HD TTS -Per nephrology  Paroxysmal A. fib with RVR: Now rate controlled. -Continue amiodarone, Toprol-XL and Eliquis.  Hypotension: Soft blood pressure this morning but not symptomatic. -Discontinued amlodipine and telmisartan -Continue low-dose Toprol-XL  Hypothyroidism:  TSH 3.0. -Continue home Synthroid  Controlled DM-2 with hyperglycemia: A1c 6.6%. Recent Labs    06/13/19 2111 06/14/19 0759 06/14/19 1153  GLUCAP 146* 136* 168*  -Discharged on home regimen as below.  Dyslipidemia. -Continue statin.  Morbid obesity:Body mass index is 44.1 kg/m. -Encourage lifestyle change to lose weight.  OSA with possible OHS: reportely couldn't tolerate CPAP due to panic attack -Recommend outpatient sleep study  Debility/physical deconditioning: Uses walker at baseline. -HH PT/OT, rolling walker ordered.     Nutrition Problem: Increased nutrient needs Etiology: chronic illness(ESRD on HD)  Signs/Symptoms: estimated  needs  Interventions: Nepro shake, Prostat, MVI      Discharge  Instructions  Discharge Instructions    (HEART FAILURE PATIENTS) Call MD:  Anytime you have any of the following symptoms: 1) 3 pound weight gain in 24 hours or 5 pounds in 1 week 2) shortness of breath, with or without a dry hacking cough 3) swelling in the hands, feet or stomach 4) if you have to sleep on extra pillows at night in order to breathe.   Complete by: As directed    Call MD for:  difficulty breathing, headache or visual disturbances   Complete by: As directed    Call MD for:  extreme fatigue   Complete by: As directed    Call MD for:  persistant dizziness or light-headedness   Complete by: As directed    Call MD for:  temperature >100.4   Complete by: As directed    Diet - low sodium heart healthy   Complete by: As directed    Discharge instructions   Complete by: As directed    It has been a pleasure taking care of you! You were hospitalized with shortness of breath with activity and low oxygen level which could be due to multiple issues including fluid overload from heart failure and kidney failure and possible sleep apnea.  You were treated medically and with hemodialysis for fluid overload.  We recommend getting sleep study for sleep apnea.  Your primary care doctor can give you a referral to sleep clinic for this.  We have made an adjustment to your blood pressure/heart medications during this hospitalization. Please review your new medication list and the directions before you take your medications.  We are discharging you on oxygen that you can use with activity and at night.  You may not need oxygen at rest during the daytime.  Please follow-up with your primary care doctor and cardiologist in 1 to 2 weeks.   Take care,   Increase activity slowly   Complete by: As directed      Allergies as of 06/14/2019      Reactions   Buspirone Other (See Comments)   Weakness   Lisinopril Cough   Metformin Diarrhea   At 1000 mg dose   Pravastatin Other (See Comments)    insomnia   Sitagliptin Other (See Comments)   constipation   Tramadol Itching   Diltiazem Hcl Palpitations   Gabapentin Palpitations   Hydralazine Rash   Lovastatin Palpitations      Medication List    STOP taking these medications   amLODipine 5 MG tablet Commonly known as: NORVASC   telmisartan 20 MG tablet Commonly known as: MICARDIS     TAKE these medications   acetaminophen 500 MG tablet Commonly known as: TYLENOL Take 2 tablets (1,000 mg total) by mouth every 8 (eight) hours.   amiodarone 200 MG tablet Commonly known as: PACERONE Take 200 mg by mouth daily.   atorvastatin 40 MG tablet Commonly known as: LIPITOR Take 40 mg by mouth daily.   azelastine 0.1 % nasal spray Commonly known as: ASTELIN Place 1 spray into both nostrils 2 (two) times daily.   benzonatate 200 MG capsule Commonly known as: TESSALON TAKE 1 CAPSULE BY MOUTH THREE TIMES A DAY AS NEEDED FOR COUGH FOR UP TO 7 DAYS   betamethasone valerate 0.1 % cream Commonly known as: VALISONE Apply 1 application topically 2 (two) times daily.   DULoxetine 30 MG capsule Commonly known as: CYMBALTA Take 30 mg  by mouth daily.   Dupixent 300 MG/2ML prefilled syringe Generic drug: dupilumab Inject 300 mg into the skin every 14 (fourteen) days.   Eliquis 5 MG Tabs tablet Generic drug: apixaban Take 5 mg by mouth 2 (two) times daily.   HumaLOG KwikPen 100 UNIT/ML KwikPen Generic drug: insulin lispro 6 units subcutaneous injection prior to meals What changed:   how much to take  how to take this  when to take this  additional instructions   insulin glargine 100 unit/mL Sopn Commonly known as: LANTUS Inject 45 Units into the skin at bedtime.   levothyroxine 88 MCG tablet Commonly known as: SYNTHROID Take 88 mcg by mouth daily before breakfast.   metoprolol succinate 25 MG 24 hr tablet Commonly known as: TOPROL-XL Take 25 mg by mouth 2 (two) times daily.   oxyCODONE 5 MG immediate  release tablet Commonly known as: Oxy IR/ROXICODONE Take 1 tablet (5 mg total) by mouth every 8 (eight) hours as needed for severe pain. Must last 30 days   torsemide 20 MG tablet Commonly known as: DEMADEX TAKE 2 TABLETS BY MOUTH ONCE DAILY   Vitamin D (Ergocalciferol) 1.25 MG (50000 UNIT) Caps capsule Commonly known as: DRISDOL Take 50,000 Units by mouth every Tuesday.            Durable Medical Equipment  (From admission, onward)         Start     Ordered   06/14/19 1046  For home use only DME Walker  Once    Comments: 5" wheel rolling walker  Question:  Patient needs a walker to treat with the following condition  Answer:  Unsteady gait   06/14/19 1045   06/14/19 1045  For home use only DME oxygen  Once    Question Answer Comment  Length of Need 6 Months   Mode or (Route) Nasal cannula   Liters per Minute 2   Frequency Continuous (stationary and portable oxygen unit needed)   Oxygen conserving device Yes   Oxygen delivery system Gas      06/14/19 1044          Consultations:  Cardiology  Nephrology  Procedures/Studies:  Hemodialysis   DG Chest 2 View  Result Date: 06/10/2019 CLINICAL DATA:  Chest pain EXAM: CHEST - 2 VIEW COMPARISON:  06/02/2019 FINDINGS: Right dialysis catheter remains in place, unchanged. Cardiomegaly with vascular congestion. No overt edema confluent opacities or effusions. No acute bony abnormality. IMPRESSION: Cardiomegaly, vascular congestion. Electronically Signed   By: Rolm Baptise M.D.   On: 06/10/2019 19:07   CT CHEST WO CONTRAST  Result Date: 06/11/2019 CLINICAL DATA:  Acute hypoxemic respiratory failure, likely secondary to fluid overload with end-stage renal disease on hemodialysis and mild acute on chronic systolic CHF. Shortness of breath with left side scapula pain. EXAM: CT CHEST WITHOUT CONTRAST TECHNIQUE: Multidetector CT imaging of the chest was performed following the standard protocol without IV contrast.  COMPARISON:  CT chest 04/29/2007 FINDINGS: Cardiovascular: Heart size is mildly enlarged.TAVR surgical changes. A main pulmonary artery is enlarged suggesting pulmonary artery hypertension. Coronary artery calcification. Atherosclerotic calcification of the thoracic aorta without aneurysm. Mediastinum/Nodes: There are few mildly enlarged mediastinal lymph nodes with short axis diameter of 1.0 cm likely reactive. No axillary adenopathy. Thyroid gland, trachea, and esophagus demonstrate no significant findings. Lungs/Pleura: There are clustered ground-glass opacities in the right upper lobe (series 2, image 35) and in the anterior left upper lobe (series 2, image 64). There are additional tree-in-bud opacities in  the right lower lobe (series 2, image 92). There is a 4 mm solid nodule in the peripheral right lung (series 2, image 84) and a small ground-glass nodule in the right upper lobe (series 2, image 51). Mild bilateral bronchial wall thickening. No pneumothorax or pleural effusion. Central airways are patent. Upper Abdomen: Status post cholecystectomy. Musculoskeletal: Multilevel degenerative disc disease in the midthoracic spine. IMPRESSION: 1. Clustered ground-glass opacities in the right upper lobe and right lower lobe with additional tree-in-bud opacities in the right lower lobe. Findings are favored to represent an infectious or inflammatory process. Consider follow-up chest CT in 3 months to ensure resolution. 2. Enlarged main pulmonary artery suggesting pulmonary artery hypertension. 3. Mild cardiomegaly. Coronary artery and aortic atherosclerosis. Aortic Atherosclerosis (ICD10-I70.0). Electronically Signed   By: Audie Pinto M.D.   On: 06/11/2019 10:19   DG Chest Portable 1 View  Result Date: 06/02/2019 CLINICAL DATA:  Shortness of breath EXAM: PORTABLE CHEST 1 VIEW COMPARISON:  01/29/2019 FINDINGS: Surgical hardware in the cervical spine. Right-sided central venous catheter tip over the SVC.  Cardiomegaly with vascular congestion and mild diffuse interstitial opacities suggestive of minimal edema. No pleural effusion or pneumothorax. Aortic atherosclerosis. IMPRESSION: Cardiomegaly with vascular congestion and mild diffuse interstitial opacities suggestive of minimal edema. Electronically Signed   By: Donavan Foil M.D.   On: 06/02/2019 19:18      Discharge Exam: Vitals:   06/14/19 0358 06/14/19 0803  BP: (!) 103/45 (!) 120/59  Pulse: (!) 54 (!) 57  Resp:  17  Temp: 98 F (36.7 C) 98 F (36.7 C)  SpO2: 98% 97%    GENERAL: No acute distress.  Appears well.  HEENT: MMM.  Vision and hearing grossly intact.  NECK: Supple.  No apparent JVD.  RESP: On 2 L by [place].  No IWOB. Good air movement bilaterally. CVS:  RRR. Heart sounds normal.  ABD/GI/GU: Bowel sounds present. Soft. Non tender.  MSK/EXT:  Moves extremities. No apparent deformity or edema.  SKIN: no apparent skin lesion or wound NEURO: Awake, alert and oriented appropriately.  No apparent focal neuro deficit. PSYCH: Calm. Normal affect.   The results of significant diagnostics from this hospitalization (including imaging, microbiology, ancillary and laboratory) are listed below for reference.     Microbiology: Recent Results (from the past 240 hour(s))  Respiratory Panel by RT PCR (Flu A&B, Covid) - Nasopharyngeal Swab     Status: None   Collection Time: 06/10/19  6:45 PM   Specimen: Nasopharyngeal Swab  Result Value Ref Range Status   SARS Coronavirus 2 by RT PCR NEGATIVE NEGATIVE Final    Comment: (NOTE) SARS-CoV-2 target nucleic acids are NOT DETECTED. The SARS-CoV-2 RNA is generally detectable in upper respiratoy specimens during the acute phase of infection. The lowest concentration of SARS-CoV-2 viral copies this assay can detect is 131 copies/mL. A negative result does not preclude SARS-Cov-2 infection and should not be used as the sole basis for treatment or other patient management decisions. A  negative result may occur with  improper specimen collection/handling, submission of specimen other than nasopharyngeal swab, presence of viral mutation(s) within the areas targeted by this assay, and inadequate number of viral copies (<131 copies/mL). A negative result must be combined with clinical observations, patient history, and epidemiological information. The expected result is Negative. Fact Sheet for Patients:  PinkCheek.be Fact Sheet for Healthcare Providers:  GravelBags.it This test is not yet ap proved or cleared by the [place] and  has been authorized for  detection and/or diagnosis of SARS-CoV-2 by FDA under an Emergency Use Authorization (EUA). This EUA will remain  in effect (meaning this test can be used) for the duration of the COVID-19 declaration under Section 564(b)(1) of the Act, 21 U.S.C. section 360bbb-3(b)(1), unless the authorization is terminated or revoked sooner.    Influenza A by PCR NEGATIVE NEGATIVE Final   Influenza B by PCR NEGATIVE NEGATIVE Final    Comment: (NOTE) The Xpert Xpress SARS-CoV-2/FLU/RSV assay is intended as an aid in  the diagnosis of influenza from Nasopharyngeal swab specimens and  should not be used as a sole basis for treatment. Nasal washings and  aspirates are unacceptable for Xpert Xpress SARS-CoV-2/FLU/RSV  testing. Fact Sheet for Patients: PinkCheek.be Fact Sheet for Healthcare Providers: GravelBags.it This test is not yet approved or cleared by the [place] FDA and  has been authorized for detection and/or diagnosis of SARS-CoV-2 by  FDA under an Emergency Use Authorization (EUA). This EUA will remain  in effect (meaning this test can be used) for the duration of the  Covid-19 declaration under Section 564(b)(1) of the Act, 21  U.S.C. section 360bbb-3(b)(1), unless the authorization is   terminated or revoked. Performed at [facility], [address] [place], [place] [address]      Labs: BNP (last 3 results) Recent Labs    07/13/18 0159 06/11/19 0909 06/13/19 0521  BNP 307.0* 494.0* 878.6*   Basic Metabolic Panel: Recent Labs  Lab 06/10/19 1736 06/11/19 0455 06/11/19 0909 06/13/19 0521  NA 140 141  --  133*  K 3.9 4.5  --  4.0  CL 99 102  --  93*  CO2 31 29  --  31  GLUCOSE 201* 143*  --  154*  BUN 26* 36*  --  27*  CREATININE 2.99* 3.63*  --  2.79*  CALCIUM 8.4* 8.5*  --  8.4*  MG  --   --   --  2.0  PHOS  --   --  4.8* 4.4   Liver Function Tests: Recent Labs  Lab 06/13/19 0521  ALBUMIN 3.3*   No results for input(s): LIPASE, AMYLASE in the last 168 hours. No results for input(s): AMMONIA in the last 168 hours. CBC: Recent Labs  Lab 06/10/19 1736 06/11/19 0455 06/13/19 0521  WBC 7.7 8.2 8.4  HGB 11.0* 10.2* 10.8*  HCT 36.5 32.8* 35.4*  MCV 94.8 95.1 95.7  PLT 218 210 224   Cardiac Enzymes: No results for input(s): CKTOTAL, CKMB, CKMBINDEX, TROPONINI in the last 168 hours. BNP: Invalid input(s): POCBNP CBG: Recent Labs  Lab 06/13/19 0743 06/13/19 1118 06/13/19 1636 06/13/19 2111 06/14/19 0759  GLUCAP 125* 160* 103* 146* 136*   D-Dimer No results for input(s): DDIMER in the last 72 hours. Hgb A1c Recent Labs    06/13/19 0521  HGBA1C 6.6*   Lipid Profile No results for input(s): CHOL, HDL, LDLCALC, TRIG, CHOLHDL, LDLDIRECT in the last 72 hours. Thyroid function studies No results for input(s): TSH, T4TOTAL, T3FREE, THYROIDAB in the last 72 hours.  Invalid input(s): FREET3 Anemia work up No results for input(s): VITAMINB12, FOLATE, FERRITIN, TIBC, IRON, RETICCTPCT in the last 72 hours. Urinalysis    Component Value Date/Time   COLORURINE YELLOW (A) 06/02/2019 2016   APPEARANCEUR CLEAR (A) 06/02/2019 2016   APPEARANCEUR Hazy 05/08/2013 1410   LABSPEC 1.005 06/02/2019 2016   LABSPEC 1.006 05/08/2013  1410   PHURINE 6.0 06/02/2019 2016   GLUCOSEU NEGATIVE 06/02/2019 2016   GLUCOSEU 50  mg/dL 05/08/2013 [place] 06/02/2019 2016   BILIRUBINUR NEGATIVE 06/02/2019 2016   BILIRUBINUR Negative 05/08/2013 [place] 06/02/2019 2016   PROTEINUR NEGATIVE 06/02/2019 2016   NITRITE NEGATIVE 06/02/2019 2016   LEUKOCYTESUR TRACE (A) 06/02/2019 2016   LEUKOCYTESUR 1+ 05/08/2013 1410   Sepsis Labs Invalid input(s): PROCALCITONIN,  WBC,  LACTICIDVEN   Time coordinating discharge: 45 minutes  SIGNED:  Mercy Riding, MD  [facility] 06/14/2019, 10:54 AM  If 7PM-7AM, please contact night-coverage [email or web address] Password [email or web address]

## 2019-06-14 NOTE — TOC Initial Note (Signed)
Transition of Care [facility]) - Initial/Assessment Note    Patient Details  Name: [name] MRN: [id number] Date of Birth: 01-09-1951  Transition of Care [facility]) CM/SW Contact:    Eileen Stanford, LCSW Phone Number: 06/14/2019, 2:47 PM  Clinical Narrative:    Pt is agreeable to [facility] however, CSW unsuccessful at finding a agency to service pt given insurance and home address. Pt is aware. Pt's sister takes her to appointments. Pt is agreeable to outpatient PT referral- stating her sister could probably take her. Referral was made.    02 and walker will be provided at bedside.            Expected Discharge Plan: Home/Self Care Barriers to Discharge: No Barriers Identified   Patient Goals and CMS Choice Patient states their goals for this hospitalization and ongoing recovery are:: to go home      Expected Discharge Plan and Services Expected Discharge Plan: Home/Self Care In-house Referral: Clinical Social Work   Post Acute Care Choice: NA Living arrangements for the past 2 months: [place] Expected Discharge Date: 06/14/19               DME Arranged: Danny Lawless DME Agency: [employer] Date DME Agency Contacted: 06/14/19 Time DME Agency Contacted: [phone] Representative spoke with at DME Agency: brad            Prior Living Arrangements/Services Living arrangements for the past 2 months: [place] with:: Self Patient language and need for interpreter reviewed:: Yes Do you feel safe going back to the place where you live?: Yes      Need for Family Participation in Patient Care: Yes (Comment) Care giver support system in place?: Yes (comment)   Criminal Activity/Legal Involvement Pertinent to Current Situation/Hospitalization: No - Comment as needed  Activities of Daily Living Home Assistive Devices/Equipment: Walker (specify type), Cane (specify quad or straight) ADL Screening (condition at time of admission) Patient's cognitive ability adequate to  safely complete daily activities?: Yes Is the patient deaf or have difficulty hearing?: No Does the patient have difficulty seeing, even when wearing glasses/contacts?: No Does the patient have difficulty concentrating, remembering, or making decisions?: No Patient able to express need for assistance with ADLs?: Yes Does the patient have difficulty dressing or bathing?: No Independently performs ADLs?: Yes (appropriate for developmental age) Does the patient have difficulty walking or climbing stairs?: Yes Weakness of Legs: None Weakness of Arms/Hands: None  Permission Sought/Granted Permission sought to share information with : Family Supports    Share Information with NAME: Butch Penny     Permission granted to share info w Relationship: sister     Emotional Assessment Appearance:: Appears stated age Attitude/Demeanor/Rapport: Engaged Affect (typically observed): Accepting, Appropriate, Calm Orientation: : Oriented to Self, Oriented to Place, Oriented to  Time, Oriented to Situation Alcohol / Substance Use: Not Applicable Psych Involvement: No (comment)  Admission diagnosis:  DOE (dyspnea on exertion) [R06.00] Hypoxia [R09.02] Acute respiratory failure with hypoxia (HCC) [J96.01] ESRD on hemodialysis ([place]) [N18.6, Z99.2] Type 2 diabetes mellitus without complication, with long-term current use of insulin (HCC) [E11.9, Z79.4] Atrial fibrillation, unspecified type [facility]) [I48.91] Patient Active Problem List   Diagnosis Date Noted  . Hypoxia 06/10/2019  . Spondylosis without myelopathy or radiculopathy, lumbosacral region 11/05/2018  . DDD (degenerative disc disease), lumbosacral 11/05/2018  . Hematochezia 09/03/2018  . Hemorrhoids, internal 09/03/2018  . Proctitis 09/03/2018  . Acute respiratory failure with hypoxia ([place]) 07/12/2018  . DM type 2 with  diabetic peripheral neuropathy ([place]) 11/25/2017  . CKD (chronic kidney disease) stage 5, GFR less than 15 ml/min (HCC) 10/05/2017   . 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 Acute kidney injury superimposed on chronic kidney disease ([place]) 09/01/2017  . CHF (congestive heart failure), NYHA class II, chronic, diastolic ([place]) 01/60/1093  . Paroxysmal A-fib ([place]) 08/14/2017  . Cervical myelopathy ([place]) 08/07/2017  . Onychomycosis of multiple toenails with type 2 diabetes mellitus and peripheral neuropathy ([place]) 06/23/2017  . Myofascial pain 05/27/2017  . Chronic pain of left upper extremity 05/27/2017  . Morbid obesity ([place]) 05/06/2017  . Uncontrolled type 2 diabetes mellitus with hyperglycemia, with long-term current use of insulin ([place]) 02/07/2017  . Hyperlipidemia, mixed 08/08/2016  . Hx of adenomatous colonic polyps 06/14/2016  . 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 Chronic pain syndrome 03/11/2016  . Obesity, Class II, BMI 35-39.9 03/11/2016  . Osteopenia of multiple sites 01/26/2016  . Heart burn 12/18/2015  . Polyneuropathy 11/16/2015  . Lumbar spondylosis 09/25/2015  . Lumbar facet syndrome (Bilateral) (L>R) 09/25/2015  . Recurrent major depressive disorder, in full remission ([place]) 09/19/2015  . Diabetic neuropathy ([place]) 09/06/2015  . H/O colostomy 07/12/2015  . Long term current use of opiate analgesic 07/03/2015  . Long term prescription opiate use 07/03/2015  . Opiate use (22.5 MME/Day) 07/03/2015  . Encounter for therapeutic drug level monitoring 07/03/2015  . Encounter for pain management planning 07/03/2015  . Neurogenic pain 07/03/2015  . Chronic musculoskeletal pain 07/03/2015  . Visceral abdominal pain 07/03/2015  . Diabetic peripheral neuropathy ([place]) 07/03/2015  . Chronic low back pain (Primary Area of Pain) (Bilateral) (L>R) 07/03/2015  . Chronic lower extremity pain (Secondary area of Pain) (Bilateral) (R>L) 07/03/2015  . Chronic hip pain (Bilateral) (L>R) 07/03/2015  . Chronic neck pain (Bilateral) (L>R) 07/03/2015  . Anemia 02/16/2015  .  Arthritis 02/16/2015  . End stage renal disease ([place]) 02/16/2015  . Focal and segmental hyalinosis 02/16/2015  . Bleeding gastrointestinal 02/16/2015  . Acquired atrophy of thyroid 02/16/2015  . Psoriasis 02/16/2015  . Small bowel obstruction ([place]) 02/04/2015  . Severe aortic stenosis 10/11/2014  . Paroxysmal supraventricular tachycardia ([place]) 10/11/2014  . SOBOE (shortness of breath on exertion) 10/11/2014  . Benign essential HTN 10/04/2014  . Obesity 09/05/2014  . B12 deficiency 03/02/2014  . Diabetes mellitus type 2 in obese ([place]) 12/01/2013  . Abnormal presence of protein in urine 10/15/2010   PCP:  Adin Hector, MD Pharmacy:   [id number] - [place], [place] [address] Phone: [phone] Fax: [phone]     Social Determinants of Health (SDOH) Interventions    Readmission Risk Interventions Readmission Risk Prevention Plan 07/14/2018  Transportation Screening Complete  PCP or Specialist Appt within 5-7 Days Complete  Home Care Screening Complete  Medication Review (RN CM) Complete  Some recent data might be hidden

## 2019-06-14 NOTE — Care Management Important Message (Signed)
Important Message  Patient Details  Name: [name] MRN: [id number] Date of Birth: October 27, 1950   Medicare Important Message Given:  Yes     Dannette Barbara 06/14/2019, 12:17 PM

## 2019-06-14 NOTE — Progress Notes (Signed)
Patient discharged to home. Tele and IV d/c'd. Patient verbalizes understanding of discharge instructions. 

## 2019-06-14 NOTE — Progress Notes (Signed)
[facility]    SUBJECTIVE: The patient reports feeling better, and back to her baseline. She denies chest pain, but states that she has had intermittent centralized chest pressure, most notably prior to dialysis, and improved afterwards. She states it has been present for quite some time, even prior to this admission. Her breathing is overall improved.   Vitals:   06/13/19 2127 06/14/19 0358 06/14/19 0803 06/14/19 1156  BP:  (!) 103/45 (!) 120/59 (!) 125/57  Pulse: 61 (!) 54 (!) 57 (!) 58  Resp:   17 17  Temp:  98 F (36.7 C) 98 F (36.7 C) 97.8 F (36.6 C)  TempSrc:  Oral Oral   SpO2:  98% 97% 98%  Weight:  113.7 kg    Height:         Intake/Output Summary (Last 24 hours) at 06/14/2019 1433 Last data filed at 06/14/2019 1018 Gross per 24 hour  Intake 243 ml  Output 0 ml  Net 243 ml      PHYSICAL EXAM  General: Well developed, well nourished, in no acute distress, sitting up in bed watching TV HEENT:  Normocephalic and atramatic Neck:  No JVD.  Lungs: Clear bilaterally to auscultation and percussion. Heart: HRRR . Normal S1 and S2 without gallops or murmurs.  Abdomen: nondistended Msk:  Back normal,  Gait not assessed. Normal strength and tone for age. Extremities: No clubbing, cyanosis or edema.   Neuro: Alert and oriented X 3. Psych:  Good affect, responds appropriately   LABS: Basic Metabolic Panel: Recent Labs    06/13/19 0521  NA 133*  K 4.0  CL 93*  CO2 31  GLUCOSE 154*  BUN 27*  CREATININE 2.79*  CALCIUM 8.4*  MG 2.0  PHOS 4.4   Liver Function Tests: Recent Labs    06/13/19 0521  ALBUMIN 3.3*   No results for input(s): LIPASE, AMYLASE in the last 72 hours. CBC: Recent Labs    06/13/19 0521  WBC 8.4  HGB 10.8*  HCT 35.4*  MCV 95.7  PLT 224   Cardiac Enzymes: No results for input(s): CKTOTAL, CKMB, CKMBINDEX, TROPONINI in the last 72 hours. BNP: Invalid input(s): POCBNP D-Dimer: No results for input(s): DDIMER in the last 72  hours. Hemoglobin A1C: Recent Labs    06/13/19 0521  HGBA1C 6.6*   Fasting Lipid Panel: No results for input(s): CHOL, HDL, LDLCALC, TRIG, CHOLHDL, LDLDIRECT in the last 72 hours. Thyroid Function Tests: No results for input(s): TSH, T4TOTAL, T3FREE, THYROIDAB in the last 72 hours.  Invalid input(s): FREET3 Anemia Panel: No results for input(s): VITAMINB12, FOLATE, FERRITIN, TIBC, IRON, RETICCTPCT in the last 72 hours.  No results found.    TELEMETRY: sinus bradycardia, 58 bpm  ASSESSMENT AND PLAN:  Active Problems:   Benign essential HTN   End stage renal disease (HCC)   Diabetic peripheral neuropathy (HCC)   Paroxysmal A-fib (HCC)   DM type 2 with diabetic peripheral neuropathy (HCC)   Hypoxia    1. Acute on chronic systolic CHF with LVEF 16% per echo in 12/2018, improved with diuresis and dialysis 2. Paroxysmal atrial fibrillation, currently in sinus rhythm, on Eliquis for stroke prevention, metoprolol and amiodarone for rate and rhythm control 3. Essential hypertension, hypotensive; home medications of metoprolol succinate, telmisartan, and amlodipine on hold at this time. 4. Severe aortic stenosis, status post TAVR 08/2017 with stable appearing prosthetic valve per most recent echocardiogram 5. ESRD, on hemodialysis   Recommendations: 1. Resume metoprolol succinate at lower dose of 12.5  mg when blood pressure permits. 2. Continue amiodarone for rhythm control 3. Continue Eliquis 5 mg BID for stroke prevention 4. Transition to oral Lasix per nephrology recommendation 5. Follow-up with Dr. Nehemiah Massed in 1 week 6. No further cardiac diagnostics recommended at this time.  Sign off for now; please call/[place] with any questions.   Clabe Seal, PA-C 06/14/2019 2:33 PM

## 2019-06-21 NOTE — Progress Notes (Signed)
Patient ID: [name], female    DOB: Nov 26, 1950, 69 y.o.   MRN: [id number]  HPI  [name] is a 69 y/o female with a history of DM, hyperlipidemia, HTN, CKD, thyroid disease, GIB, aortic valve stenosis, anxiety, anemia, atrial fibrillation, prior tobacco use and chronic heart failure.   Echo report from 12/21/2018 reviewed and showed an EF of 45% along with mild MR/TR and prosthetic AV. Echo report from 06/24/2018 reviewed and showed an EF of 25% along with moderate MR/TR.   Admitted 06/10/19 due to acute respiratory failure. Cardiology consult obtained. Initially given IV lasix and hemodialysis started. Desat on room air so needed oxygen at 2L. Soft BP so medications adjusted. Discharged after 4 days. Was in the ED 06/02/19 due to weakness where she was evaluated and released.   She presents today for a follow-up visit with a chief complaint of moderate shortness of breath upon minimal exertion. She describes this as chronic in nature having been present for several years. She has associated fatigue, weakness and chronic difficulty sleeping along with this. She denies any dizziness, abdominal distention, palpitations, pedal edema, chest pain, cough or weight gain.   Says that she has nasal pillows for her sleep apnea at home but says that she "just can't get used to them" and that they cause a "panic attack".   Receiving dialysis three days/ week.   Past Medical History:  Diagnosis Date  . Anemia   . Anxiety   . Aortic valve stenosis   . Arrhythmia    atrial fibrillation  . Arthritis    feet, legs  . B12 deficiency   . Bowel obstruction ([place])   . CHF (congestive heart failure) ([place])   . CKD (chronic kidney disease)    protein in urine  . Colostomy in place [facility])   . Diabetes mellitus without complication ([place])   . Dialysis patient [facility] [place] [facility])    T/Th/Sa  . Diverticulitis large intestine   . Dysrhythmia   . Ectopic atrial tachycardia ([place])   . FSGS (focal segmental glomerulosclerosis)    . Gastritis   . GI bleed   . Heart murmur   . Hyperlipidemia   . Hypertension   . Hypothyroidism   . Hypothyroidism    unspecified  . MRSA (methicillin resistant Staphylococcus aureus)    at abdominal wound.  Jan 2017.  Treated.   . Neuropathy   . Neuropathy in diabetes ([place])   . Obesity   . Pedal edema   . Psoriasis   . Shortness of breath dyspnea   . Sleep apnea    CPAP  . Vertigo    Past Surgical History:  Procedure Laterality Date  . ABDOMINAL HYSTERECTOMY    . APPLICATION OF WOUND VAC N/A 02/24/2015   Procedure: APPLICATION OF WOUND VAC;  Surgeon: Clayburn Pert, MD;  Location: [facility];  Service: General;  Laterality: N/A;  . APPLICATION OF WOUND VAC N/A 02/26/2015   Procedure: APPLICATION OF WOUND VAC;  Surgeon: Clayburn Pert, MD;  Location: [facility];  Service: General;  Laterality: N/A;  . AV FISTULA PLACEMENT Left 01/29/2019   Procedure: ARTERIOVENOUS (AV) FISTULA CREATION ( BRACHIAL CEPHALIC);  Surgeon: Katha Cabal, MD;  Location: [facility];  Service: Vascular;  Laterality: Left;  . BACK SURGERY     spur frmoved from lower back  . CARDIAC CATHETERIZATION    . CARDIAC VALVE REPLACEMENT  08/2017   [facility]  . CARDIOVERSION N/A 07/15/2018   Procedure: CARDIOVERSION;  Surgeon: Nehemiah Massed,  Everlean Cherry, MD;  Location: [facility];  Service: Cardiovascular;  Laterality: N/A;  . CATARACT EXTRACTION W/PHACO Right 11/16/2018   Procedure: CATARACT EXTRACTION PHACO AND INTRAOCULAR LENS PLACEMENT (IOC) RIGHT DIABETiC  01:18.3  13.5%  10.82;  Surgeon: Eulogio Bear, MD;  Location: [place];  Service: Ophthalmology;  Laterality: Right;  Diabetic - insulin sleep apnea  . CATARACT EXTRACTION W/PHACO Left 12/07/2018   Procedure: CATARACT EXTRACTION PHACO AND INTRAOCULAR LENS PLACEMENT (IOC) LEFT DIABETIC 01:02.6  8.8%  5.77;  Surgeon: Eulogio Bear, MD;  Location: [place];  Service: Ophthalmology;  Laterality: Left;  Diabetic - insulin  . CHOLECYSTECTOMY     . COLECTOMY    . COLONOSCOPY WITH PROPOFOL N/A 09/04/2016   Procedure: COLONOSCOPY WITH PROPOFOL;  Surgeon: Manya Silvas, MD;  Location: [facility] ENDOSCOPY;  Service: Endoscopy;  Laterality: N/A;  . COLOSTOMY REVERSAL N/A 07/12/2015   Procedure: COLOSTOMY REVERSAL;  Surgeon: Clayburn Pert, MD;  Location: [facility];  Service: General;  Laterality: N/A;  . DEBRIDEMENT OF ABDOMINAL WALL ABSCESS N/A 02/22/2015   Procedure: DEBRIDEMENT OF ABDOMINAL WALL ABSCESS;  Surgeon: Clayburn Pert, MD;  Location: [facility];  Service: General;  Laterality: N/A;  . DIALYSIS/PERMA CATHETER INSERTION N/A 10/20/2018   Procedure: DIALYSIS/PERMA CATHETER INSERTION;  Surgeon: Katha Cabal, MD;  Location: [place] CV LAB;  Service: Cardiovascular;  Laterality: N/A;  . EXCISION MASS ABDOMINAL N/A 02/24/2015   Procedure: EXCISION MASS ABDOMINAL  / [place];  Surgeon: Clayburn Pert, MD;  Location: [facility];  Service: General;  Laterality: N/A;  . EXCISION MASS ABDOMINAL N/A 02/26/2015   Procedure: EXCISION MASS ABDOMINAL/wash out;  Surgeon: Clayburn Pert, MD;  Location: [facility];  Service: General;  Laterality: N/A;  . FLEXIBLE SIGMOIDOSCOPY  06/19/2015   Procedure: FLEXIBLE SIGMOIDOSCOPY;  Surgeon: Lucilla Lame, MD;  Location: [place];  Service: Endoscopy;;  UNABLE TO ACCESS OSTOMY SITE FOR ACCESS INTO COLON  . HERNIA REPAIR     umbilical  . LAPAROSCOPY  07/12/2015   Procedure: LAPAROSCOPY DIAGNOSTIC;  Surgeon: Clayburn Pert, MD;  Location: [facility];  Service: General;;  . LAPAROTOMY  07/12/2015   Procedure: EXPLORATORY LAPAROTOMY;  Surgeon: Clayburn Pert, MD;  Location: [facility];  Service: General;;  . LAPAROTOMY N/A 02/06/2015   Procedure: Laparotomy, reduction of incarcerated parastomal hernia, repair of parastomal hernia with mesh;  Surgeon: Sherri Rad, MD;  Location: [facility];  Service: General;  Laterality: N/A;  . laparotomy closure of cecal perforation  05/09/2013   Dr. Marina Gravel  . LYSIS OF  ADHESION  07/12/2015   Procedure: LYSIS OF ADHESION;  Surgeon: Clayburn Pert, MD;  Location: [facility];  Service: General;;  . TONSILLECTOMY    . WOUND DEBRIDEMENT N/A 03/03/2015   Procedure: DEBRIDEMENT ABDOMINAL WOUND;  Surgeon: Florene Glen, MD;  Location: [facility];  Service: General;  Laterality: N/A;  . WOUND DEBRIDEMENT N/A 03/09/2015   Procedure: DEBRIDEMENT ABDOMINAL WOUND;  Surgeon: Florene Glen, MD;  Location: [facility];  Service: General;  Laterality: N/A;   Family History  Problem Relation Age of Onset  . Diabetes Mother   . Hypertension Father   . CAD Father   . Heart attack Father   . Colon cancer Brother    Social History   Tobacco Use  . Smoking status: Former Smoker    Types: Cigarettes    Quit date: 04/18/2013    Years since quitting: 6.1  . Smokeless tobacco: Never Used  Substance Use Topics  .  Alcohol use: No    Alcohol/week: 0.0 standard drinks   Allergies  Allergen Reactions  . Buspirone Other (See Comments)    Weakness  . Lisinopril Cough  . Metformin Diarrhea    At 1000 mg dose  . Pravastatin Other (See Comments)    insomnia  . Sitagliptin Other (See Comments)    constipation  . Tramadol Itching  . Diltiazem Hcl Palpitations  . Gabapentin Palpitations  . Hydralazine Rash  . Lovastatin Palpitations   Prior to Admission medications   Medication Sig Start Date End Date Taking? Authorizing Provider  acetaminophen (TYLENOL) 500 MG tablet Take 2 tablets (1,000 mg total) by mouth every 8 (eight) hours. 06/14/19  Yes Mercy Riding, MD  amiodarone (PACERONE) 200 MG tablet Take 200 mg by mouth daily.  06/29/18  Yes [provider]  apixaban (ELIQUIS) 5 MG TABS tablet Take 5 mg by mouth 2 (two) times daily.   Yes [provider]  atorvastatin (LIPITOR) 40 MG tablet Take 40 mg by mouth daily.  04/19/15  Yes [provider]  azelastine (ASTELIN) 0.1 % nasal spray Place 1 spray into both nostrils 2 (two) times daily. 03/28/19  Yes  [provider]  betamethasone valerate (VALISONE) 0.1 % cream Apply 1 application topically 2 (two) times daily.    Yes [provider]  DULoxetine (CYMBALTA) 30 MG capsule Take 30 mg by mouth daily.  01/18/15  Yes [provider]  DUPIXENT 300 MG/2ML SOSY Inject 300 mg into the skin every 14 (fourteen) days. 05/28/18  Yes [provider]  HUMALOG KWIKPEN 100 UNIT/ML KwikPen 6 units subcutaneous injection prior to meals Patient taking differently: Inject 22-38 Units into the skin See admin instructions. Inject 22 units in the morning & 38 unit at supper 07/15/18  Yes Wieting, Richard, MD  insulin glargine (LANTUS) 100 unit/mL SOPN Inject 45 Units into the skin at bedtime.    Yes [provider]  levothyroxine (SYNTHROID, LEVOTHROID) 88 MCG tablet Take 88 mcg by mouth daily before breakfast.  09/11/15  Yes [provider]  metoprolol succinate (TOPROL-XL) 25 MG 24 hr tablet Take 25 mg by mouth daily.  06/24/18  Yes [provider]  oxyCODONE (OXY IR/ROXICODONE) 5 MG immediate release tablet Take 1 tablet (5 mg total) by mouth every 8 (eight) hours as needed for severe pain. Must last 30 days 06/03/19 07/03/19 Yes Milinda Pointer, MD  torsemide (DEMADEX) 20 MG tablet TAKE 2 TABLETS BY MOUTH ONCE DAILY   Yes [provider]  Vitamin D, Ergocalciferol, (DRISDOL) 1.25 MG (50000 UT) CAPS capsule Take 50,000 Units by mouth every Tuesday.   Yes [provider]    Review of Systems  Constitutional: Positive for fatigue (easily). Negative for activity change.  HENT: Negative for congestion, rhinorrhea and sore throat.   Eyes: Negative.   Respiratory: Positive for shortness of breath (with minimal exertion). Negative for cough.   Cardiovascular: Negative for chest pain, palpitations and leg swelling.  Gastrointestinal: Negative for abdominal distention and abdominal pain.  Endocrine: Negative.   Genitourinary: Negative.    Musculoskeletal: Positive for back pain (at times). Negative for neck pain.  Skin: Negative.   Allergic/Immunologic: Negative.   Neurological: Positive for weakness and numbness (in both hands). Negative for dizziness and light-headedness.  Hematological: Negative for adenopathy. Does not bruise/bleed easily.  Psychiatric/Behavioral: Positive for sleep disturbance (not sleeping well). Negative for dysphoric mood. The patient is not nervous/anxious.    Vitals:   06/22/19 1348  BP: 113/76  Pulse: 95  Resp: 20  SpO2: 90%  Weight: 249 lb 2 oz (113 kg)  Height: 5\' 3"  (1.6 m)   Wt Readings from Last 3 Encounters:  06/22/19 249 lb 2 oz (113 kg)  06/14/19 250 lb 11.2 oz (113.7 kg)  06/02/19 230 lb (104.3 kg)   Lab Results  Component Value Date   CREATININE 2.79 (H) 06/13/2019   CREATININE 3.63 (H) 06/11/2019   CREATININE 2.99 (H) 06/10/2019    Physical Exam Vitals and nursing note reviewed.  Constitutional:      Appearance: She is well-developed.  HENT:     Head: Normocephalic and atraumatic.  Cardiovascular:     Rate and Rhythm: Regular rhythm. Tachycardia present.  Pulmonary:     Effort: Pulmonary effort is normal. No respiratory distress.     Breath sounds: No wheezing or rales.  Abdominal:     Palpations: Abdomen is soft.     Tenderness: There is no abdominal tenderness.  Musculoskeletal:     Right lower leg: No tenderness. No edema.     Left lower leg: No tenderness. No edema.  Skin:    General: Skin is warm and dry.  Neurological:     General: No focal deficit present.     Mental Status: She is alert and oriented to person, place, and time.  Psychiatric:        Mood and Affect: Mood normal.        Behavior: Behavior normal.     Assessment & Plan:  1. Chronic heart failure with reduced ejection fraction- - NYHA class III - euvolemic today  - weighing daily; reminded to call for an overnight weight gain of >2 pounds or a weekly weight gain of >5 pounds - not  adding salt to her food and has been reading food labels for sodium content. Reminded to keep daily sodium intake to 2000mg  sodium/ day - GFR too low for farxiga - saw cardiology Nehemiah Massed) 05/25/19 - BNP 06/13/19 was 265.0 - Pharm D reconciled medications - encouraged her to use the nasal pillows during the day even for 10 minutes at a time to see if she can get herself used to them; explained the correlation between her untreated sleep apnea and heart failure.   2: HTN- - BP looks good although on the low side - saw PCP Caryl Comes) 06/08/19 - BMP from 06/13/19 reviewed and showed sodium 133, potassium 4.0, creatinine 2.79 and GFR 19  3: DM- - A1c on 06/13/19 was 6.6% - glucose at home was 115 - saw endocrinology ([facility]) 03/30/19 - dialysis on T, TH, Sat    Patient did not bring her medications nor a list. Each medication was verbally reviewed with the patient and she was encouraged to bring the bottles to every visit to confirm accuracy of list.   Return in 4 months or sooner for any questions/problems before then.

## 2019-06-22 ENCOUNTER — Other Ambulatory Visit: Payer: Self-pay

## 2019-06-22 ENCOUNTER — Encounter: Payer: Self-pay | Admitting: Family

## 2019-06-22 ENCOUNTER — Telehealth: Payer: Self-pay | Admitting: Family

## 2019-06-22 ENCOUNTER — Ambulatory Visit: Payer: Medicare Other | Attending: Family | Admitting: Family

## 2019-06-22 VITALS — BP 113/76 | HR 95 | Resp 20 | Ht 63.0 in | Wt 249.1 lb

## 2019-06-22 DIAGNOSIS — R5383 Other fatigue: Secondary | ICD-10-CM | POA: Insufficient documentation

## 2019-06-22 DIAGNOSIS — Z992 Dependence on renal dialysis: Secondary | ICD-10-CM | POA: Diagnosis not present

## 2019-06-22 DIAGNOSIS — Z7901 Long term (current) use of anticoagulants: Secondary | ICD-10-CM | POA: Diagnosis not present

## 2019-06-22 DIAGNOSIS — E785 Hyperlipidemia, unspecified: Secondary | ICD-10-CM | POA: Diagnosis not present

## 2019-06-22 DIAGNOSIS — F419 Anxiety disorder, unspecified: Secondary | ICD-10-CM | POA: Insufficient documentation

## 2019-06-22 DIAGNOSIS — Z79899 Other long term (current) drug therapy: Secondary | ICD-10-CM | POA: Diagnosis not present

## 2019-06-22 DIAGNOSIS — Z952 Presence of prosthetic heart valve: Secondary | ICD-10-CM | POA: Insufficient documentation

## 2019-06-22 DIAGNOSIS — J9611 Chronic respiratory failure with hypoxia: Secondary | ICD-10-CM | POA: Insufficient documentation

## 2019-06-22 DIAGNOSIS — I10 Essential (primary) hypertension: Secondary | ICD-10-CM

## 2019-06-22 DIAGNOSIS — R0602 Shortness of breath: Secondary | ICD-10-CM | POA: Diagnosis present

## 2019-06-22 DIAGNOSIS — E669 Obesity, unspecified: Secondary | ICD-10-CM | POA: Diagnosis not present

## 2019-06-22 DIAGNOSIS — N189 Chronic kidney disease, unspecified: Secondary | ICD-10-CM | POA: Insufficient documentation

## 2019-06-22 DIAGNOSIS — M1990 Unspecified osteoarthritis, unspecified site: Secondary | ICD-10-CM | POA: Insufficient documentation

## 2019-06-22 DIAGNOSIS — Z8249 Family history of ischemic heart disease and other diseases of the circulatory system: Secondary | ICD-10-CM | POA: Insufficient documentation

## 2019-06-22 DIAGNOSIS — I130 Hypertensive heart and chronic kidney disease with heart failure and stage 1 through stage 4 chronic kidney disease, or unspecified chronic kidney disease: Secondary | ICD-10-CM | POA: Diagnosis not present

## 2019-06-22 DIAGNOSIS — Z794 Long term (current) use of insulin: Secondary | ICD-10-CM | POA: Insufficient documentation

## 2019-06-22 DIAGNOSIS — E039 Hypothyroidism, unspecified: Secondary | ICD-10-CM | POA: Diagnosis not present

## 2019-06-22 DIAGNOSIS — Z87891 Personal history of nicotine dependence: Secondary | ICD-10-CM | POA: Diagnosis not present

## 2019-06-22 DIAGNOSIS — G4730 Sleep apnea, unspecified: Secondary | ICD-10-CM | POA: Diagnosis not present

## 2019-06-22 DIAGNOSIS — R531 Weakness: Secondary | ICD-10-CM | POA: Diagnosis not present

## 2019-06-22 DIAGNOSIS — I350 Nonrheumatic aortic (valve) stenosis: Secondary | ICD-10-CM | POA: Diagnosis not present

## 2019-06-22 DIAGNOSIS — I5022 Chronic systolic (congestive) heart failure: Secondary | ICD-10-CM | POA: Diagnosis not present

## 2019-06-22 DIAGNOSIS — Z933 Colostomy status: Secondary | ICD-10-CM | POA: Diagnosis not present

## 2019-06-22 DIAGNOSIS — E1140 Type 2 diabetes mellitus with diabetic neuropathy, unspecified: Secondary | ICD-10-CM | POA: Insufficient documentation

## 2019-06-22 DIAGNOSIS — E1122 Type 2 diabetes mellitus with diabetic chronic kidney disease: Secondary | ICD-10-CM | POA: Diagnosis not present

## 2019-06-22 NOTE — Patient Instructions (Signed)
Continue weighing daily and call for an overnight weight gain of > 2 pounds or a weekly weight gain of >5 pounds. 

## 2019-06-22 NOTE — Progress Notes (Signed)
[place] - PHARMACIST COUNSELING NOTE  ADHERENCE ASSESSMENT  Adherence strategy: Patient endorses taking medications daily as prescribed. She utilizes a pillbox.    Do you ever forget to take your medication? [] Yes (1) [x] No (0)  Do you ever skip doses due to side effects? [] Yes (1) [x] No (0)  Do you have trouble affording your medicines? [] Yes (1) [x] No (0)  Are you ever unable to pick up your medication due to transportation difficulties? [] Yes (1) [x] No (0)  Do you ever stop taking your medications because you don't believe they are helping? [] Yes (1) [x] No (0)  Total score 0   Recommendations given to patient about increasing adherence: No intervention needed  Guideline-Directed Medical Therapy/Evidence Based Medicine  ACE/ARB/ARNI: Telmisartan 20 mg daily (recently discontinued in setting of low BP) Beta Blocker: Metoprolol succinate 25 mg daily Aldosterone Antagonist: Contraindicated based on renal function Diuretic: Torsemide 40 mg daily    SUBJECTIVE   Past Medical History:  Diagnosis Date  . Anemia   . Anxiety   . Aortic valve stenosis   . Arrhythmia    atrial fibrillation  . Arthritis    feet, legs  . B12 deficiency   . Bowel obstruction ([place])   . CHF (congestive heart failure) ([place])   . CKD (chronic kidney disease)    protein in urine  . Colostomy in place [facility])   . Diabetes mellitus without complication ([place])   . Dialysis patient [facility])    T/Th/Sa  . Diverticulitis large intestine   . Dysrhythmia   . 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 FSGS (focal segmental glomerulosclerosis)   . Gastritis   . GI bleed   . Heart murmur   . Hyperlipidemia   . Hypertension   . Hypothyroidism   . Hypothyroidism    unspecified  . MRSA (methicillin resistant Staphylococcus aureus)    at abdominal wound.  Jan 2017.  Treated.   . Neuropathy   . Neuropathy in diabetes ([place])   . Obesity   . Pedal edema   . Psoriasis    . Shortness of breath dyspnea   . Sleep apnea    CPAP  . Vertigo         OBJECTIVE   Vital signs: HR 95, BP 113/76, weight (pounds) 249 ECHO: Date 12/21/2018, EF 45%, notes mild LVH, prosthetic aortic valve, trivial MV regurgitation, mild TV regurgitation Cath: Date 09/12/2017, notes mildly elevated filling pressures with normal CI. 60% lesion in ostium of 2nd diagonal branch  BMP Latest Ref Rng & Units 06/13/2019 06/11/2019 06/10/2019  Glucose 70 - 99 mg/dL 154(H) 143(H) 201(H)  BUN 8 - 23 mg/dL 27(H) 36(H) 26(H)  Creatinine 0.44 - 1.00 mg/dL 2.79(H) 3.63(H) 2.99(H)  Sodium 135 - 145 mmol/L 133(L) 141 140  Potassium 3.5 - 5.1 mmol/L 4.0 4.5 3.9  Chloride 98 - 111 mmol/L 93(L) 102 99  CO2 22 - 32 mmol/L 31 29 31   Calcium 8.9 - 10.3 mg/dL 8.4(L) 8.5(L) 8.4(L)    ASSESSMENT Patient is a 69 y/o F with a medical history including severe aortic stenosis s/p TAVR, CHF, atrial fibrillation on apixaban, ESRD on HD, T2DM, hypothyroidism, chronic pain syndrome, psoriasis who presents to CHF clinic for management. Patient reports coming from HD today.   Patient was recently hospitalized 4/22-4/26 for SOB secondary to fluid overload in setting of ESRD and CHF. Blood pressure medications including telmisartan and amlodipine discontinued in setting of soft pressures.    PLAN  CHF -  Metoprolol succinate 25 mg daily, torsemide 40 mg daily. Telmisartan discontinued -Reports shortness of breath has improved since discharge  Atrial fibrillation -Amiodarone 200 mg daily, metoprolol succinate 25 mg daily, apixaban 5 mg BID -Denies signs/symptoms of bleeding with apixaban  ESRD -HD TTS -Ergocalciferol 50,000 units qWeek  T2DM, controlled -Last HgbA1c 6.6% on 06/13/2019 -Insulin lispro 22 units qAM & 38 units qSupper, insulin glargine 45 units qHS  Hypertension -BP has been soft. Amlodipine & telmisartan discontinued during last hospital admission  Hypothyroidism -Levothyroxine 88 mcg qAM on  an empty stomach  Chronic pain -oxycodone PRN, APAP PRN  Psoriasis -Dupixent , betamethasone cream    Time spent: 15 minutes  [place] Resident 06/22/2019 2:05 PM    Current Outpatient Medications:  .  acetaminophen (TYLENOL) 500 MG tablet, Take 2 tablets (1,000 mg total) by mouth every 8 (eight) hours., Disp: 30 tablet, Rfl: 0 .  amiodarone (PACERONE) 200 MG tablet, Take 200 mg by mouth daily. , Disp: , Rfl:  .  apixaban (ELIQUIS) 5 MG TABS tablet, Take 5 mg by mouth 2 (two) times daily., Disp: , Rfl:  .  atorvastatin (LIPITOR) 40 MG tablet, Take 40 mg by mouth daily. , Disp: , Rfl:  .  azelastine (ASTELIN) 0.1 % nasal spray, Place 1 spray into both nostrils 2 (two) times daily., Disp: , Rfl:  .  betamethasone valerate (VALISONE) 0.1 % cream, Apply 1 application topically 2 (two) times daily. , Disp: , Rfl:  .  DULoxetine (CYMBALTA) 30 MG capsule, Take 30 mg by mouth daily. , Disp: , Rfl:  .  DUPIXENT 300 MG/2ML SOSY, Inject 300 mg into the skin every 14 (fourteen) days., Disp: , Rfl:  .  HUMALOG KWIKPEN 100 UNIT/ML KwikPen, 6 units subcutaneous injection prior to meals (Patient taking differently: Inject 22-38 Units into the skin See admin instructions. Inject 22 units in the morning & 38 unit at supper), Disp: 15 mL, Rfl: 11 .  insulin glargine (LANTUS) 100 unit/mL SOPN, Inject 45 Units into the skin at bedtime. , Disp: , Rfl:  .  levothyroxine (SYNTHROID, LEVOTHROID) 88 MCG tablet, Take 88 mcg by mouth daily before breakfast. , Disp: , Rfl:  .  metoprolol succinate (TOPROL-XL) 25 MG 24 hr tablet, Take 25 mg by mouth daily. , Disp: , Rfl:  .  oxyCODONE (OXY IR/ROXICODONE) 5 MG immediate release tablet, Take 1 tablet (5 mg total) by mouth every 8 (eight) hours as needed for severe pain. Must last 30 days, Disp: 90 tablet, Rfl: 0 .  torsemide (DEMADEX) 20 MG tablet, TAKE 2 TABLETS BY MOUTH ONCE DAILY, Disp: , Rfl:  .  Vitamin D, Ergocalciferol, (DRISDOL) 1.25 MG (50000  UT) CAPS capsule, Take 50,000 Units by mouth every Tuesday., Disp: , Rfl:  .  benzonatate (TESSALON) 200 MG capsule, TAKE 1 CAPSULE BY MOUTH THREE TIMES A DAY AS NEEDED FOR COUGH FOR UP TO 7 DAYS, Disp: , Rfl:   COUNSELING POINTS/CLINICAL PEARLS   Metoprolol Succinate (Goal: 200 mg once daily) Warn patient to avoid activities requiring mental alertness or coordination until drug effects are realized, as drug may cause dizziness. Tell patient planning major surgery with anesthesia to alert physician that drug is being used, as drug impairs ability of heart to respond to reflex adrenergic stimuli. Drug may cause diarrhea, fatigue, headache, or depression. Advise diabetic patient to carefully monitor blood glucose as drug may mask symptoms of hypoglycemia. Patient should take extended-release tablet with or immediately following meals. Counsel  patient against sudden discontinuation of drug, as this may precipitate hypertension, angina, or myocardial infarction. In the event of a missed dose, counsel patient to skip the missed dose and maintain a regular dosing schedule.  Torsemide  Side effects may include excessive urination.  Tell patient to report symptoms of ototoxicity.  Instruct patient to report lightheadedness or syncope.  Warn patient to avoid use of nonprescription NSAID products without first discussing it with their healthcare provider.  DRUGS TO AVOID IN HEART FAILURE  Drug or Class Mechanism  Analgesics . NSAIDs . COX-2 inhibitors . Glucocorticoids  Sodium and water retention, increased systemic vascular resistance, decreased response to diuretics   Diabetes Medications . Metformin . Thiazolidinediones o Rosiglitazone (Avandia) o Pioglitazone (Actos) . DPP4 Inhibitors o Saxagliptin (Onglyza) o Sitagliptin (Januvia)   Lactic acidosis Possible calcium channel blockade   Unknown  Antiarrhythmics . Class I  o Flecainide o Disopyramide . Class  III o Sotalol . Other o Dronedarone  Negative inotrope, proarrhythmic   Proarrhythmic, beta blockade  Negative inotrope  Antihypertensives . Alpha Blockers o Doxazosin . Calcium Channel Blockers o Diltiazem o Verapamil o Nifedipine . Central Alpha Adrenergics o Moxonidine . Peripheral Vasodilators o Minoxidil  Increases renin and aldosterone  Negative inotrope    Possible sympathetic withdrawal  Unknown  Anti-infective . Itraconazole . Amphotericin B  Negative inotrope Unknown  Hematologic . Anagrelide . Cilostazol   Possible inhibition of PD IV Inhibition of PD III causing arrhythmias  Neurologic/Psychiatric . Stimulants . Anti-Seizure Drugs o Carbamazepine o Pregabalin . Antidepressants o Tricyclics o Citalopram . Parkinsons o Bromocriptine o Pergolide o Pramipexole . Antipsychotics o Clozapine . Antimigraine o Ergotamine o Methysergide . Appetite suppressants . Bipolar o Lithium  Peripheral alpha and beta agonist activity  Negative inotrope and chronotrope Calcium channel blockade  Negative inotrope, proarrhythmic Dose-dependent QT prolongation  Excessive serotonin activity/valvular damage Excessive serotonin activity/valvular damage Unknown  IgE mediated hypersensitivy, calcium channel blockade  Excessive serotonin activity/valvular damage Excessive serotonin activity/valvular damage Valvular damage  Direct myofibrillar degeneration, adrenergic stimulation  Antimalarials . Chloroquine . Hydroxychloroquine Intracellular inhibition of lysosomal enzymes  Urologic Agents . Alpha Blockers o Doxazosin o Prazosin o Tamsulosin o Terazosin  Increased renin and aldosterone  Adapted from Page RL, et al. "Drugs That May Cause or Exacerbate Heart Failure: A Scientific Statement from the [place]." Circulation 2016; 158:X09-M07. DOI: 10.1161/CIR.0000000000000426   MEDICATION ADHERENCES TIPS AND STRATEGIES 1. Taking  medication as prescribed improves patient outcomes in heart failure (reduces hospitalizations, improves symptoms, increases survival) 2. Side effects of medications can be managed by decreasing doses, switching agents, stopping drugs, or adding additional therapy. Please let someone in the [facility] know if you have having bothersome side effects so we can modify your regimen. Do not alter your medication regimen without talking to [place].  3. Medication reminders can help patients remember to take drugs on time. If you are missing or forgetting doses you can try linking behaviors, using pill boxes, or an electronic reminder like an alarm on your phone or an app. Some people can also get automated phone calls as medication reminders.

## 2019-06-22 NOTE — Telephone Encounter (Signed)
Spoke to patient who said she is doing ok with no issues to complain about. Is taking her meds, following diet, and checking weight daily with no symptoms.   [name], [place]

## 2019-06-28 NOTE — Progress Notes (Signed)
Patient: [name]  Service Category: E/M  Provider: Gaspar Cola, MD  DOB: Aug 24, 1950  DOS: 06/30/2019  Location: Office  MRN: [id number]  Setting: Ambulatory outpatient  Referring Provider: Adin Hector, MD  Type: Established Patient  Specialty: Interventional Pain Management  PCP: Adin Hector, MD  Location: Remote location  Delivery: TeleHealth     Virtual Encounter - Pain Management PROVIDER NOTE: Information contained herein reflects review and annotations entered in association with encounter. Interpretation of such information and data should be left to medically-trained personnel. Information provided to patient can be located elsewhere in the medical record under "Patient Instructions". Document created using STT-dictation technology, any transcriptional errors that may result from process are unintentional.    Contact & Pharmacy Preferred: [phone] Home: [phone] (home) Mobile: [phone] (mobile) E-mail: No e-mail address on record  [id number] [address] [id number]: [phone]: [phone]  Pre-screening  [name] "in-person" vs "virtual" encounter. She indicated preferring virtual for this encounter.   Reason COVID-19*  Social distancing based on CDC and AMA recommendations.   I contacted SBufford Spikeson 06/30/2019 via telephone.      I clearly identified myself as FGaspar Cola MD. I verified that I was speaking with the correct person using two identifiers (Name: SLIL LEPAGE and date of birth: 1April 05, 1952.  Consent I sought verbal advanced consent from SBufford Spikesfor virtual visit interactions. I informed Ms. AOrihuelaof possible security and privacy concerns, risks, and limitations associated with providing "not-in-person" medical evaluation and management services. I also informed Ms. AZenia Residesof the availability of "in-person" appointments. Finally, I informed  her that there would be a charge for the virtual visit and that she could be  personally, fully or partially, financially responsible for it. Ms. APhoexpressed understanding and agreed to proceed.   Historic Elements   Ms. SAMARYS SLIWINSKIis a 69y.o. year old, female patient evaluated today after her last contact with our practice on 03/30/2019. Ms. ADuba has a past medical history of Anemia, Anxiety, Aortic valve stenosis, Arrhythmia, Arthritis, B12 deficiency, Bowel obstruction ([place], CHF (congestive heart failure) ([place], CKD (chronic kidney disease), Colostomy in place ([facility], Diabetes mellitus without complication ([place], Dialysis patient ([facility], Diverticulitis large intestine, Dysrhythmia, Ectopic atrial tachycardia ([place], FSGS (focal segmental glomerulosclerosis), Gastritis, GI bleed, Heart murmur, Hyperlipidemia, Hypertension, Hypothyroidism, Hypothyroidism, MRSA (methicillin resistant Staphylococcus aureus), Neuropathy, Neuropathy in diabetes ([place], Obesity, Pedal edema, Psoriasis, Shortness of breath dyspnea, Sleep apnea, and Vertigo. She also  has a past surgical history that includes Colectomy; laparotomy closure of cecal perforation (05/09/2013); Tonsillectomy; Back surgery; Cardiac catheterization; Abdominal hysterectomy; Debridement of abdominal wall abscess (N/A, 02/22/2015); Excision mass abdominal (N/A, 02/24/2015); Application if wound vac (N/A, 02/24/2015); Excision mass abdominal (N/A, 02/26/2015); Application if wound vac (N/A, 02/26/2015); Wound debridement (N/A, 03/03/2015); Wound debridement (N/A, 03/09/2015); Flexible sigmoidoscopy (06/19/2015); Colostomy reversal (N/A, 07/12/2015); laparotomy (07/12/2015); laparoscopy (07/12/2015); Lysis of adhesion (07/12/2015); laparotomy (N/A, 02/06/2015); Colonoscopy with propofol (N/A, 09/04/2016); Cardiac valve replacement (08/2017); Cardioversion (N/A, 07/15/2018); DIALYSIS/PERMA CATHETER INSERTION (N/A, 10/20/2018); Cataract extraction w/PHACO (Right, 11/16/2018);  Cataract extraction w/PHACO (Left, 12/07/2018); Cholecystectomy; Hernia repair; and AV fistula placement (Left, 01/29/2019). Ms. AStullerhas a current medication list which includes the following prescription(s): apixaban, atorvastatin, betamethasone valerate, duloxetine, dupixent, humalog kwikpen, insulin glargine, levothyroxine, torsemide, vitamin d (ergocalciferol), acetaminophen, amiodarone, azelastine, metoprolol succinate, [START ON 07/03/2019] oxycodone, [START ON 08/02/2019]  oxycodone, and [START ON 09/01/2019] oxycodone. She  reports that she quit smoking about 6 years ago. Her smoking use included cigarettes. She has never used smokeless tobacco. She reports that she does not drink alcohol or use drugs. Ms. Person is allergic to buspirone; lisinopril; metformin; pravastatin; sitagliptin; tramadol; diltiazem hcl; gabapentin; hydralazine; and lovastatin.   HPI  Today, she is being contacted for medication management. The patient indicates doing well with the current medication regimen. No adverse reactions or side effects reported to the medications.   Pharmacotherapy Assessment  Analgesic: Oxycodone IR 43m, 1 tab PO q 8hrs (137mday of oxycodone) MME/day:22.20m70may.   Monitoring: [place] PMP: PDMP reviewed during this encounter.       Pharmacotherapy: No side-effects or adverse reactions reported. Compliance: No problems identified. Effectiveness: Clinically acceptable. Plan: Refer to "POC".  UDS:  Summary  Date Value Ref Range Status  04/09/2018 FINAL  Final    Comment:    ==================================================================== TOXASSURE SELECT 13 (MW) ==================================================================== Test                             Result       Flag       Units Drug Present not Declared for Prescription Verification   Oxycodone                      386          UNEXPECTED ng/mg creat   Oxymorphone                    614          UNEXPECTED ng/mg  creat   Noroxycodone                   349          UNEXPECTED ng/mg creat   Noroxymorphone                 141          UNEXPECTED ng/mg creat    Sources of oxycodone are scheduled prescription medications.    Oxymorphone, noroxycodone, and noroxymorphone are expected    metabolites of oxycodone. Oxymorphone is also available as a    scheduled prescription medication. ==================================================================== Test                      Result    Flag   Units      Ref Range   Creatinine              76               mg/dL      >=20 ==================================================================== Declared Medications:  The flagging and interpretation on this report are based on the  following declared medications.  Unexpected results may arise from  inaccuracies in the declared medications.  **Note: The testing scope of this panel does not include following  reported medications:  Amlodipine Besylate  Apixaban  Atorvastatin  Clobetasol  Duloxetine  Insulin (Lantus)  Levothyroxine ==================================================================== For clinical consultation, please call ([phone]===================================================================    Laboratory Chemistry Profile   Renal Lab Results  Component Value Date   BUN 27 (H) 06/13/2019   CREATININE 2.79 (H) 06/13/2019   LABCREA 26 07/18/2015   GFRAA 19 (L) 06/13/2019   GFRNONAA 17 (L) 06/13/2019     Hepatic Lab Results  Component Value Date   AST  19 07/24/2017   ALT 14 07/24/2017   ALBUMIN 3.3 (L) 06/13/2019   ALKPHOS 59 07/24/2017   LIPASE 21 02/04/2015     Electrolytes Lab Results  Component Value Date   NA 133 (L) 06/13/2019   K 4.0 06/13/2019   CL 93 (L) 06/13/2019   CALCIUM 8.4 (L) 06/13/2019   MG 2.0 06/13/2019   PHOS 4.4 06/13/2019     Bone Lab Results  Component Value Date   25OHVITD1 43 11/07/2015   25OHVITD2 38 11/07/2015   25OHVITD3  5.4 11/07/2015     Inflammation (CRP: Acute Phase) (ESR: Chronic Phase) Lab Results  Component Value Date   CRP 0.6 11/07/2015   ESRSEDRATE 16 11/07/2015   LATICACIDVEN 0.8 02/21/2015       Note: Above Lab results reviewed.  Imaging  CT CHEST WO CONTRAST CLINICAL DATA:  Acute hypoxemic respiratory failure, likely secondary to fluid overload with end-stage renal disease on hemodialysis and mild acute on chronic systolic CHF. Shortness of breath with left side scapula pain.  EXAM: CT CHEST WITHOUT CONTRAST  TECHNIQUE: Multidetector CT imaging of the chest was performed following the standard protocol without IV contrast.  COMPARISON:  CT chest 04/29/2007  FINDINGS: Cardiovascular: Heart size is mildly enlarged.TAVR surgical changes. A main pulmonary artery is enlarged suggesting pulmonary artery hypertension. Coronary artery calcification. Atherosclerotic calcification of the thoracic aorta without aneurysm.  Mediastinum/Nodes: There are few mildly enlarged mediastinal lymph nodes with short axis diameter of 1.0 cm likely reactive. No axillary adenopathy. Thyroid gland, trachea, and esophagus demonstrate no significant findings.  Lungs/Pleura: There are clustered ground-glass opacities in the right upper lobe (series 2, image 35) and in the anterior left upper lobe (series 2, image 64). There are additional tree-in-bud opacities in the right lower lobe (series 2, image 92). There is a 4 mm solid nodule in the peripheral right lung (series 2, image 84) and a small ground-glass nodule in the right upper lobe (series 2, image 51). Mild bilateral bronchial wall thickening. No pneumothorax or pleural effusion. Central airways are patent.  Upper Abdomen: Status post cholecystectomy.  Musculoskeletal: Multilevel degenerative disc disease in the midthoracic spine.  IMPRESSION: 1. Clustered ground-glass opacities in the right upper lobe and right lower lobe with  additional tree-in-bud opacities in the right lower lobe. Findings are favored to represent an infectious or inflammatory process. Consider follow-up chest CT in 3 months to ensure resolution. 2. Enlarged main pulmonary artery suggesting pulmonary artery hypertension. 3. Mild cardiomegaly. Coronary artery and aortic atherosclerosis.  Aortic Atherosclerosis (ICD10-I70.0).  Electronically Signed   By: Audie Pinto M.D.   On: 06/11/2019 10:19  Assessment  The primary encounter diagnosis was Chronic pain syndrome. Diagnoses of Chronic low back pain (Primary Area of Pain) (Bilateral) (L>R), Chronic lower extremity pain (Secondary area of Pain) (Bilateral) (R>L), Chronic abdominal pain (Third area of Pain) (Bilateral) (L>R), Lumbar facet syndrome (Bilateral) (L>R), Lumbar spondylosis, Visceral abdominal pain, and Pharmacologic therapy were also pertinent to this visit.  Plan of Care  Problem-specific:  No problem-specific Assessment & Plan notes found for this encounter.  Ms. VARNELL DONATE has a current medication list which includes the following long-term medication(s): apixaban, atorvastatin, duloxetine, humalog kwikpen, levothyroxine, [START ON 07/03/2019] oxycodone, [START ON 08/02/2019] oxycodone, and [START ON 09/01/2019] oxycodone.  Pharmacotherapy (Medications Ordered): Meds ordered this encounter  Medications  . oxyCODONE (OXY IR/ROXICODONE) 5 MG immediate release tablet    Sig: Take 1 tablet (5 mg total) by mouth every 8 (eight) hours as  needed for severe pain. Must last 30 days    Dispense:  90 tablet    Refill:  0    Chronic Pain: STOP Act (Not applicable) Fill 1 day early if closed on refill date. Do not fill until: 07/03/2019. To last until: 08/02/2019. Avoid benzodiazepines within 8 hours of opioids  . oxyCODONE (OXY IR/ROXICODONE) 5 MG immediate release tablet    Sig: Take 1 tablet (5 mg total) by mouth every 8 (eight) hours as needed for severe pain. Must last 30 days     Dispense:  90 tablet    Refill:  0    Chronic Pain: STOP Act (Not applicable) Fill 1 day early if closed on refill date. Do not fill until: 08/02/2019. To last until: 09/01/2019. Avoid benzodiazepines within 8 hours of opioids  . oxyCODONE (OXY IR/ROXICODONE) 5 MG immediate release tablet    Sig: Take 1 tablet (5 mg total) by mouth every 8 (eight) hours as needed for severe pain. Must last 30 days    Dispense:  90 tablet    Refill:  0    Chronic Pain: STOP Act (Not applicable) Fill 1 day early if closed on refill date. Do not fill until: 09/01/2019. To last until: 10/01/2019. Avoid benzodiazepines within 8 hours of opioids   Orders:  Orders Placed This Encounter  Procedures  . ToxASSURE Select 13 (MW), Urine    Volume: 30 ml(s). Minimum 3 ml of urine is needed. Document temperature of fresh sample. Indications: Long term (current) use of opiate analgesic (W80.321)    Order Specific Question:   Release to patient    Answer:   Immediate   Follow-up plan:   Return in about 13 weeks (around 09/29/2019) for (F2F), (MM).      Interventional treatment options: Under consideration:   NOTE: Currently on dialysis. Diagnostic bilateral lumbar facet block  Possible bilateral lumbar facet RFA.  Diagnostic bilateral celiac plexus block  Diagnostic bilateral IA hip injection  Diagnostic bilateral femoral nerve and obturator NB  Possible bilateral femoral nerve and obturator nerve RFA.  Diagnostic bilateral IA knee joint injection  Possible series of 5 bilateral IA Hyalgan knee injections.  Diagnostic bilateral genicular NB  Possible bilateral genicular nerve RFA    Palliative PRN treatment(s):   None at this time.      Recent Visits No visits were found meeting these conditions.  Showing recent visits within past 90 days and meeting all other requirements   Today's Visits Date Type Provider Dept  06/30/19 Telemedicine Milinda Pointer, MD [facility]  Showing today's visits and  meeting all other requirements   Future Appointments No visits were found meeting these conditions.  Showing future appointments within next 90 days and meeting all other requirements   I discussed the assessment and treatment plan with the patient. The patient was provided an opportunity to ask questions and all were answered. The patient agreed with the plan and demonstrated an understanding of the instructions.  Patient advised to call back or seek an in-person evaluation if the symptoms or condition worsens.  Duration of encounter: 11 minutes.  Note by: Gaspar Cola, MD Date: 06/30/2019; Time: 11:49 AM

## 2019-06-29 ENCOUNTER — Telehealth: Payer: Self-pay

## 2019-06-29 ENCOUNTER — Encounter: Payer: Self-pay | Admitting: Pain Medicine

## 2019-06-29 NOTE — Telephone Encounter (Signed)
No answer.  No answering machine.  Unable to leave message.

## 2019-06-30 ENCOUNTER — Ambulatory Visit: Payer: Medicare Other | Attending: Pain Medicine | Admitting: Pain Medicine

## 2019-06-30 ENCOUNTER — Other Ambulatory Visit: Payer: Self-pay

## 2019-06-30 DIAGNOSIS — M5442 Lumbago with sciatica, left side: Secondary | ICD-10-CM

## 2019-06-30 DIAGNOSIS — G894 Chronic pain syndrome: Secondary | ICD-10-CM | POA: Diagnosis not present

## 2019-06-30 DIAGNOSIS — R109 Unspecified abdominal pain: Secondary | ICD-10-CM | POA: Diagnosis not present

## 2019-06-30 DIAGNOSIS — G8929 Other chronic pain: Secondary | ICD-10-CM

## 2019-06-30 DIAGNOSIS — M79604 Pain in right leg: Secondary | ICD-10-CM

## 2019-06-30 DIAGNOSIS — Z79899 Other long term (current) drug therapy: Secondary | ICD-10-CM

## 2019-06-30 DIAGNOSIS — M47816 Spondylosis without myelopathy or radiculopathy, lumbar region: Secondary | ICD-10-CM

## 2019-06-30 DIAGNOSIS — M5441 Lumbago with sciatica, right side: Secondary | ICD-10-CM

## 2019-06-30 DIAGNOSIS — M79605 Pain in left leg: Secondary | ICD-10-CM

## 2019-06-30 MED ORDER — OXYCODONE HCL 5 MG PO TABS: 5.0000 mg | ORAL_TABLET | Freq: Three times a day (TID) | ORAL | 0 refills | Status: DC | PRN

## 2019-07-06 ENCOUNTER — Other Ambulatory Visit: Payer: Self-pay | Admitting: Pain Medicine

## 2019-07-09 LAB — TOXASSURE SELECT 13 (MW), URINE

## 2019-07-22 ENCOUNTER — Telehealth (INDEPENDENT_AMBULATORY_CARE_PROVIDER_SITE_OTHER): Payer: Self-pay

## 2019-07-22 NOTE — Telephone Encounter (Signed)
I attempted to contact the patient and was unable to leave a message due to needed a access code. Patient is scheduled with Dr. Delana Meyer for a permcath removal on 07/27/19 with a 10:15 arrival time to the [facility]. Patient will do covid testing on 07/23/19 between 8-1 pm at the [facility]. Pre-procedure instructions will be faxed to patient's dialysis center in [place].

## 2019-07-23 ENCOUNTER — Other Ambulatory Visit: Payer: Medicare Other | Attending: Vascular Surgery

## 2019-07-26 ENCOUNTER — Other Ambulatory Visit (INDEPENDENT_AMBULATORY_CARE_PROVIDER_SITE_OTHER): Payer: Self-pay | Admitting: Nurse Practitioner

## 2019-07-27 NOTE — Telephone Encounter (Signed)
Patient has been rescheduled from 07/27/19 to 08/03/19 due to not having the covid test as requested. The arrival time is 10:15 to the MM and covid testing on 07/30/19 between 8-1 pm. Pre-procedure instructions will be faxed to the patient's dialysis center in [place].

## 2019-07-30 ENCOUNTER — Other Ambulatory Visit: Payer: Self-pay

## 2019-07-30 ENCOUNTER — Other Ambulatory Visit
Admission: RE | Admit: 2019-07-30 | Discharge: 2019-07-30 | Disposition: A | Discharge status: Home / Self Care | Payer: Medicare Other | Source: Ambulatory Visit | Attending: Vascular Surgery | Admitting: Vascular Surgery

## 2019-07-30 DIAGNOSIS — Z20822 Contact with and (suspected) exposure to covid-19: Secondary | ICD-10-CM | POA: Insufficient documentation

## 2019-07-30 DIAGNOSIS — Z01812 Encounter for preprocedural laboratory examination: Secondary | ICD-10-CM | POA: Diagnosis present

## 2019-07-30 LAB — SARS CORONAVIRUS 2 (TAT 6-24 HRS): SARS Coronavirus 2: NEGATIVE

## 2019-08-03 ENCOUNTER — Encounter
Admission: RE | Disposition: A | Discharge status: Home / Self Care | Payer: Self-pay | Source: Home / Self Care | Attending: Vascular Surgery

## 2019-08-03 ENCOUNTER — Other Ambulatory Visit: Payer: Self-pay

## 2019-08-03 ENCOUNTER — Encounter: Payer: Self-pay | Admitting: Vascular Surgery

## 2019-08-03 ENCOUNTER — Ambulatory Visit
Admission: RE | Admit: 2019-08-03 | Discharge: 2019-08-03 | Disposition: A | Discharge status: Home / Self Care | Payer: Medicare Other | Attending: Vascular Surgery | Admitting: Vascular Surgery

## 2019-08-03 DIAGNOSIS — Z8614 Personal history of Methicillin resistant Staphylococcus aureus infection: Secondary | ICD-10-CM | POA: Insufficient documentation

## 2019-08-03 DIAGNOSIS — M1990 Unspecified osteoarthritis, unspecified site: Secondary | ICD-10-CM | POA: Diagnosis not present

## 2019-08-03 DIAGNOSIS — E669 Obesity, unspecified: Secondary | ICD-10-CM | POA: Insufficient documentation

## 2019-08-03 DIAGNOSIS — Z8249 Family history of ischemic heart disease and other diseases of the circulatory system: Secondary | ICD-10-CM | POA: Insufficient documentation

## 2019-08-03 DIAGNOSIS — E11649 Type 2 diabetes mellitus with hypoglycemia without coma: Secondary | ICD-10-CM | POA: Insufficient documentation

## 2019-08-03 DIAGNOSIS — Z952 Presence of prosthetic heart valve: Secondary | ICD-10-CM | POA: Insufficient documentation

## 2019-08-03 DIAGNOSIS — Z992 Dependence on renal dialysis: Secondary | ICD-10-CM

## 2019-08-03 DIAGNOSIS — G4733 Obstructive sleep apnea (adult) (pediatric): Secondary | ICD-10-CM | POA: Diagnosis not present

## 2019-08-03 DIAGNOSIS — E785 Hyperlipidemia, unspecified: Secondary | ICD-10-CM | POA: Diagnosis not present

## 2019-08-03 DIAGNOSIS — N186 End stage renal disease: Secondary | ICD-10-CM

## 2019-08-03 DIAGNOSIS — I132 Hypertensive heart and chronic kidney disease with heart failure and with stage 5 chronic kidney disease, or end stage renal disease: Secondary | ICD-10-CM | POA: Insufficient documentation

## 2019-08-03 DIAGNOSIS — Z87891 Personal history of nicotine dependence: Secondary | ICD-10-CM | POA: Diagnosis not present

## 2019-08-03 DIAGNOSIS — E039 Hypothyroidism, unspecified: Secondary | ICD-10-CM | POA: Insufficient documentation

## 2019-08-03 DIAGNOSIS — E1140 Type 2 diabetes mellitus with diabetic neuropathy, unspecified: Secondary | ICD-10-CM | POA: Diagnosis not present

## 2019-08-03 DIAGNOSIS — Z885 Allergy status to narcotic agent status: Secondary | ICD-10-CM | POA: Insufficient documentation

## 2019-08-03 DIAGNOSIS — Z833 Family history of diabetes mellitus: Secondary | ICD-10-CM | POA: Diagnosis not present

## 2019-08-03 DIAGNOSIS — Z4901 Encounter for fitting and adjustment of extracorporeal dialysis catheter: Secondary | ICD-10-CM | POA: Diagnosis not present

## 2019-08-03 DIAGNOSIS — Z933 Colostomy status: Secondary | ICD-10-CM | POA: Diagnosis not present

## 2019-08-03 DIAGNOSIS — I509 Heart failure, unspecified: Secondary | ICD-10-CM | POA: Insufficient documentation

## 2019-08-03 DIAGNOSIS — E1122 Type 2 diabetes mellitus with diabetic chronic kidney disease: Secondary | ICD-10-CM | POA: Diagnosis not present

## 2019-08-03 DIAGNOSIS — Z888 Allergy status to other drugs, medicaments and biological substances status: Secondary | ICD-10-CM | POA: Insufficient documentation

## 2019-08-03 DIAGNOSIS — I4891 Unspecified atrial fibrillation: Secondary | ICD-10-CM | POA: Diagnosis not present

## 2019-08-03 HISTORY — PX: DIALYSIS/PERMA CATHETER REMOVAL: CATH118289

## 2019-08-03 SURGERY — DIALYSIS/PERMA CATHETER REMOVAL
Anesthesia: LOCAL

## 2019-08-03 SURGICAL SUPPLY — 1 items: TRAY LACERAT/PLASTIC (MISCELLANEOUS) ×2 IMPLANT

## 2019-08-03 NOTE — Op Note (Signed)
Operative Note  Preoperative diagnosis:    1. ESRD with functional permanent access  Postoperative diagnosis:   1. ESRD with functional permanent access  Procedure:  Removal of Right Permcath  Performing Clinician: Gadge Hermiz  Surgeon:  Schnier  Anesthesia:  Local  EBL:  Minimal  Indication for the Procedure:  The patient has a functional permanent dialysis access and no longer needs their permcath.  This can be removed.  Risks and benefits are discussed and informed consent is obtained.  Description of the Procedure:  [name] right neck, chest and existing catheter were sterilely prepped and draped. The area around the catheter was anesthetized copiously with 1% lidocaine. The catheter was dissected out with curved hemostats until the cuff was freed from the surrounding fibrous sheath. The fiber sheath was transected, and the catheter was then removed in its entirety using gentle traction. Pressure was held and sterile dressings were placed. The patient tolerated the procedure well and was taken to the recovery room in stable condition.  [name] A Monea Pesantez  08/03/2019, 10:43 AM  This note was created with Dragon Medical transcription system. Any errors in dictation are purely unintentional.

## 2019-08-03 NOTE — H&P (Signed)
[facility] Admission History & Physical  MRN : [id number]  [name] is a 69 y.o. (April 24, 1950) female who presents with chief complaint of scheduled PermCath removal.  History of Present Illness:  I am asked to evaluate the patient by the dialysis center. The patient was sent here because she has a functioning dialysis access and is no longer in need of a PermCath. The patient reports they're not been any problems with any of their dialysis runs. They are reporting good flows with good parameters at dialysis. Patient denies pain or tenderness overlying the access.  There is no pain with dialysis.  The patient denies hand pain or finger pain consistent with steal syndrome.  No fevers or chills while on dialysis.  No current facility-administered medications for this encounter.   Past Medical History:  Diagnosis Date  . Anemia   . Anxiety   . Aortic valve stenosis   . Arrhythmia    atrial fibrillation  . Arthritis    feet, legs  . B12 deficiency   . Bowel obstruction ([place])   . CHF (congestive heart failure) ([place])   . CKD (chronic kidney disease)    protein in urine  . Colostomy in place [facility])   . Diabetes mellitus without complication ([place])   . Dialysis patient [facility])    T/Th/Sa  . Diverticulitis large intestine   . Dysrhythmia   . Ectopic atrial tachycardia ([place])   . FSGS (focal segmental glomerulosclerosis)   . Gastritis   . GI bleed   . Heart murmur   . Hyperlipidemia   . Hypertension   . Hypothyroidism   . Hypothyroidism    unspecified  . MRSA (methicillin resistant Staphylococcus aureus)    at abdominal wound.  Jan 2017.  Treated.   . Neuropathy   . Neuropathy in diabetes ([place])   . Obesity   . Pedal edema   . Psoriasis   . Shortness of breath dyspnea   . Sleep apnea    CPAP  . Vertigo    Past Surgical History:  Procedure Laterality Date  . ABDOMINAL HYSTERECTOMY    . APPLICATION OF WOUND VAC N/A 02/24/2015   Procedure:  APPLICATION OF WOUND VAC;  Surgeon: Clayburn Pert, MD;  Location: [facility];  Service: General;  Laterality: N/A;  . APPLICATION OF WOUND VAC N/A 02/26/2015   Procedure: APPLICATION OF WOUND VAC;  Surgeon: Clayburn Pert, MD;  Location: [facility];  Service: General;  Laterality: N/A;  . AV FISTULA PLACEMENT Left 01/29/2019   Procedure: ARTERIOVENOUS (AV) FISTULA CREATION ( BRACHIAL CEPHALIC);  Surgeon: Katha Cabal, MD;  Location: [facility];  Service: Vascular;  Laterality: Left;  . BACK SURGERY     spur frmoved from lower back  . CARDIAC CATHETERIZATION    . CARDIAC VALVE REPLACEMENT  08/2017   [facility]  . CARDIOVERSION N/A 07/15/2018   Procedure: CARDIOVERSION;  Surgeon: Corey Skains, MD;  Location: [facility];  Service: Cardiovascular;  Laterality: N/A;  . CATARACT EXTRACTION W/PHACO Right 11/16/2018   Procedure: CATARACT EXTRACTION PHACO AND INTRAOCULAR LENS PLACEMENT (IOC) RIGHT DIABETiC  01:18.3  13.5%  10.82;  Surgeon: Eulogio Bear, MD;  Location: [facility];  Service: Ophthalmology;  Laterality: Right;  Diabetic - insulin sleep apnea  . CATARACT EXTRACTION W/PHACO Left 12/07/2018   Procedure: CATARACT EXTRACTION PHACO AND INTRAOCULAR LENS PLACEMENT (IOC) LEFT DIABETIC 01:02.6  8.8%  5.77;  Surgeon: Eulogio Bear, MD;  Location: [facility];  Service:  Ophthalmology;  Laterality: Left;  Diabetic - insulin  . CHOLECYSTECTOMY    . COLECTOMY    . COLONOSCOPY WITH PROPOFOL N/A 09/04/2016   Procedure: COLONOSCOPY WITH PROPOFOL;  Surgeon: Manya Silvas, MD;  Location[facility];  Service: Endoscopy;  Laterality: N/A;  . COLOSTOMY REVERSAL N/A 07/12/2015   Procedure: COLOSTOMY REVERSAL;  Surgeon: Clayburn Pert, MD;  Location: [facility];  Service: General;  Laterality: N/A;  . DEBRIDEMENT OF ABDOMINAL WALL ABSCESS N/A 02/22/2015   Procedure: DEBRIDEMENT OF ABDOMINAL WALL ABSCESS;  Surgeon: Clayburn Pert, MD;  Location: [facility];  Service: General;   Laterality: N/A;  . DIALYSIS/PERMA CATHETER INSERTION N/A 10/20/2018   Procedure: DIALYSIS/PERMA CATHETER INSERTION;  Surgeon: Katha Cabal, MD;  Location: [facility];  Service: Cardiovascular;  Laterality: N/A;  . EXCISION MASS ABDOMINAL N/A 02/24/2015   Procedure: EXCISION MASS ABDOMINAL  / [place];  Surgeon: Clayburn Pert, MD;  Location: [facility];  Service: General;  Laterality: N/A;  . EXCISION MASS ABDOMINAL N/A 02/26/2015   Procedure: EXCISION MASS ABDOMINAL/wash out;  Surgeon: Clayburn Pert, MD;  Location: [facility];  Service: General;  Laterality: N/A;  . FLEXIBLE SIGMOIDOSCOPY  06/19/2015   Procedure: FLEXIBLE SIGMOIDOSCOPY;  Surgeon: Lucilla Lame, MD;  Location: [place];  Service: Endoscopy;;  UNABLE TO ACCESS OSTOMY SITE FOR ACCESS INTO COLON  . HERNIA REPAIR     umbilical  . LAPAROSCOPY  07/12/2015   Procedure: LAPAROSCOPY DIAGNOSTIC;  Surgeon: Clayburn Pert, MD;  Location: [facility];  Service: General;;  . LAPAROTOMY  07/12/2015   Procedure: EXPLORATORY LAPAROTOMY;  Surgeon: Clayburn Pert, MD;  Location: [facility];  Service: General;;  . LAPAROTOMY N/A 02/06/2015   Procedure: Laparotomy, reduction of incarcerated parastomal hernia, repair of parastomal hernia with mesh;  Surgeon: Sherri Rad, MD;  Location: [facility];  Service: General;  Laterality: N/A;  . laparotomy closure of cecal perforation  05/09/2013   Dr. Marina Gravel  . LYSIS OF ADHESION  07/12/2015   Procedure: LYSIS OF ADHESION;  Surgeon: Clayburn Pert, MD;  Location: [facility];  Service: General;;  . TONSILLECTOMY    . WOUND DEBRIDEMENT N/A 03/03/2015   Procedure: DEBRIDEMENT ABDOMINAL WOUND;  Surgeon: Florene Glen, MD;  Location: [facility];  Service: General;  Laterality: N/A;  . WOUND DEBRIDEMENT N/A 03/09/2015   Procedure: DEBRIDEMENT ABDOMINAL WOUND;  Surgeon: Florene Glen, MD;  Location: [facility];  Service: General;  Laterality: N/A;   Social History Social History   Tobacco Use  .  Smoking status: Former Smoker    Types: Cigarettes    Quit date: 04/18/2013    Years since quitting: 6.2  . Smokeless tobacco: Never Used  Vaping Use  . Vaping Use: Never used  Substance Use Topics  . Alcohol use: No    Alcohol/week: 0.0 standard drinks  . Drug use: No   Family History Family History  Problem Relation Age of Onset  . Diabetes Mother   . Hypertension Father   . CAD Father   . Heart attack Father   . Colon cancer Brother   No family history of bleeding or clotting disorders, autoimmune disease or porphyria  Allergies  Allergen Reactions  . Buspirone Other (See Comments)    Weakness  . Lisinopril Cough  . Metformin Diarrhea    At 1000 mg dose  . Pravastatin Other (See Comments)    insomnia  . Sitagliptin Other (See Comments)    constipation  . Tramadol Itching  . Diltiazem Hcl  Palpitations  . Gabapentin Palpitations  . Hydralazine Rash  . Lovastatin Palpitations   REVIEW OF SYSTEMS (Negative unless checked)  Constitutional: [] Weight loss  [] Fever  [] Chills Cardiac: [] Chest pain   [] Chest pressure   [] Palpitations   [] Shortness of breath when laying flat   [] Shortness of breath at rest   [x] Shortness of breath with exertion. Vascular:  [] Pain in legs with walking   [] Pain in legs at rest   [] Pain in legs when laying flat   [] Claudication   [] Pain in feet when walking  [] Pain in feet at rest  [] Pain in feet when laying flat   [] History of DVT   [] Phlebitis   [x] Swelling in legs   [] Varicose veins   [] Non-healing ulcers Pulmonary:   [] Uses home oxygen   [] Productive cough   [] Hemoptysis   [] Wheeze  [] COPD   [] Asthma Neurologic:  [] Dizziness  [] Blackouts   [] Seizures   [] History of stroke   [] History of TIA  [] Aphasia   [] Temporary blindness   [] Dysphagia   [] Weakness or numbness in arms   [] Weakness or numbness in legs Musculoskeletal:  [x] Arthritis   [] Joint swelling   [] Joint pain   [] Low back pain Hematologic:  [] Easy bruising  [] Easy bleeding    [] Hypercoagulable state   [] Anemic  [] Hepatitis Gastrointestinal:  [] Blood in stool   [] Vomiting blood  [] Gastroesophageal reflux/heartburn   [] Difficulty swallowing. Genitourinary:  [x] Chronic kidney disease   [] Difficult urination  [] Frequent urination  [] Burning with urination   [] Blood in urine Skin:  [] Rashes   [] Ulcers   [] Wounds Psychological:  [] History of anxiety   []  History of major depression.  Physical Examination  Vitals:   08/03/19 1017  BP: (!) 142/76  Pulse: (!) 107  Resp: 19  Temp: 98 F (36.7 C)  TempSrc: Oral  SpO2: 93%   There is no height or weight on file to calculate BMI. Gen: WD/WN, NAD Head: [place]/AT, No temporalis wasting. Prominent temp pulse not noted. Ear/Nose/Throat: Hearing grossly intact, nares w/o erythema or drainage, oropharynx w/o Erythema/Exudate,  Eyes: Conjunctiva clear, sclera non-icteric Neck: Trachea midline.  No JVD.  Pulmonary:  Good air movement, respirations not labored, no use of accessory muscles.  Cardiac: RRR, normal S1, S2. Vascular:  Vessel Right Left  Radial Palpable Palpable  Ulnar Not Palpable Not Palpable  Brachial Palpable Palpable  Carotid Palpable, without bruit Palpable, without bruit  Gastrointestinal: soft, non-tender/non-distended. No guarding/reflex.  Musculoskeletal: M/S 5/5 throughout.  Extremities without ischemic changes.  No deformity or atrophy.  Neurologic: Sensation grossly intact in extremities.  Symmetrical.  Speech is fluent. Motor exam as listed above. Psychiatric: Judgment intact, Mood & affect appropriate for pt's clinical situation. Dermatologic: No rashes or ulcers noted.  No cellulitis or open wounds. Lymph : No Cervical, Axillary, or Inguinal lymphadenopathy.  CBC Lab Results  Component Value Date   WBC 8.4 06/13/2019   HGB 10.8 (L) 06/13/2019   HCT 35.4 (L) 06/13/2019   MCV 95.7 06/13/2019   PLT 224 06/13/2019   BMET    Component Value Date/Time   NA 133 (L) 06/13/2019 0521   NA 143  07/19/2013 0523   K 4.0 06/13/2019 0521   K 3.7 07/19/2013 0523   CL 93 (L) 06/13/2019 0521   CL 109 (H) 07/19/2013 0523   CO2 31 06/13/2019 0521   CO2 29 07/19/2013 0523   GLUCOSE 154 (H) 06/13/2019 0521   GLUCOSE 110 (H) 07/19/2013 0523   BUN 27 (H) 06/13/2019 0521   BUN 8  07/19/2013 0523   CREATININE 2.79 (H) 06/13/2019 0521   CREATININE 1.58 (H) 07/19/2013 0523   CALCIUM 8.4 (L) 06/13/2019 0521   CALCIUM 7.8 (L) 07/19/2013 0523   GFRNONAA 17 (L) 06/13/2019 0521   GFRNONAA 34 (L) 07/19/2013 0523   GFRAA 19 (L) 06/13/2019 0521   GFRAA 40 (L) 07/19/2013 0523   CrCl cannot be calculated (Patient's most recent lab result is older than the maximum 21 days allowed.).  COAG Lab Results  Component Value Date   INR 1.0 01/29/2019   INR 1.3 (H) 01/26/2019   INR 1.28 02/22/2015   Radiology No results found.  Assessment/Plan I am asked to evaluate the patient by the dialysis center. The patient was sent here because she has a functioning dialysis access and is no longer in need of a PermCath.  1.  End-stage renal disease requiring hemodialysis:  Permcath was removed with out complication. Patient will continue dialysis therapy without further interruption if a successful intervention is not achieved then a tunneled catheter will be placed. Dialysis has already been arranged.  2.  Hypertension:  Patient will continue medical management; nephrology is following no changes in oral medications.  3. Diabetes mellitus:  Glucose will be monitored and oral medications been held this morning once the patient has undergone the patient's procedure po intake will be reinitiated and again Accu-Cheks will be used to assess the blood glucose level and treat as needed. The patient will be restarted on the patient's usual hypoglycemic regime  4.  Coronary artery disease:  EKG will be monitored. Nitrates will be used if needed. [name] oral cardiac medications will be continued.  Discussed with  Dr. Francene Castle, PA-C  08/03/2019 10:24 AM

## 2019-09-07 ENCOUNTER — Other Ambulatory Visit: Payer: Self-pay

## 2019-09-07 ENCOUNTER — Inpatient Hospital Stay
Admission: EM | Admit: 2019-09-07 | Discharge: 2019-09-10 | DRG: 291 | Disposition: A | Discharge status: Home / Self Care | Payer: Medicare Other | Attending: Internal Medicine | Admitting: Internal Medicine

## 2019-09-07 ENCOUNTER — Emergency Department: Payer: Medicare Other

## 2019-09-07 ENCOUNTER — Encounter: Payer: Self-pay | Admitting: Emergency Medicine

## 2019-09-07 DIAGNOSIS — Z6841 Body Mass Index (BMI) 40.0 and over, adult: Secondary | ICD-10-CM

## 2019-09-07 DIAGNOSIS — E785 Hyperlipidemia, unspecified: Secondary | ICD-10-CM | POA: Diagnosis not present

## 2019-09-07 DIAGNOSIS — Z954 Presence of other heart-valve replacement: Secondary | ICD-10-CM

## 2019-09-07 DIAGNOSIS — Z888 Allergy status to other drugs, medicaments and biological substances status: Secondary | ICD-10-CM

## 2019-09-07 DIAGNOSIS — I132 Hypertensive heart and chronic kidney disease with heart failure and with stage 5 chronic kidney disease, or end stage renal disease: Principal | ICD-10-CM | POA: Diagnosis present

## 2019-09-07 DIAGNOSIS — F419 Anxiety disorder, unspecified: Secondary | ICD-10-CM | POA: Diagnosis present

## 2019-09-07 DIAGNOSIS — E1165 Type 2 diabetes mellitus with hyperglycemia: Secondary | ICD-10-CM

## 2019-09-07 DIAGNOSIS — Z9842 Cataract extraction status, left eye: Secondary | ICD-10-CM

## 2019-09-07 DIAGNOSIS — E1142 Type 2 diabetes mellitus with diabetic polyneuropathy: Secondary | ICD-10-CM | POA: Diagnosis present

## 2019-09-07 DIAGNOSIS — E538 Deficiency of other specified B group vitamins: Secondary | ICD-10-CM | POA: Diagnosis present

## 2019-09-07 DIAGNOSIS — E782 Mixed hyperlipidemia: Secondary | ICD-10-CM | POA: Diagnosis present

## 2019-09-07 DIAGNOSIS — E039 Hypothyroidism, unspecified: Secondary | ICD-10-CM | POA: Diagnosis present

## 2019-09-07 DIAGNOSIS — Z833 Family history of diabetes mellitus: Secondary | ICD-10-CM

## 2019-09-07 DIAGNOSIS — N186 End stage renal disease: Secondary | ICD-10-CM | POA: Diagnosis not present

## 2019-09-07 DIAGNOSIS — Z8616 Personal history of COVID-19: Secondary | ICD-10-CM

## 2019-09-07 DIAGNOSIS — I5033 Acute on chronic diastolic (congestive) heart failure: Secondary | ICD-10-CM | POA: Diagnosis present

## 2019-09-07 DIAGNOSIS — Z885 Allergy status to narcotic agent status: Secondary | ICD-10-CM

## 2019-09-07 DIAGNOSIS — Z961 Presence of intraocular lens: Secondary | ICD-10-CM | POA: Diagnosis present

## 2019-09-07 DIAGNOSIS — Z7902 Long term (current) use of antithrombotics/antiplatelets: Secondary | ICD-10-CM

## 2019-09-07 DIAGNOSIS — Z8249 Family history of ischemic heart disease and other diseases of the circulatory system: Secondary | ICD-10-CM

## 2019-09-07 DIAGNOSIS — R0602 Shortness of breath: Secondary | ICD-10-CM

## 2019-09-07 DIAGNOSIS — I480 Paroxysmal atrial fibrillation: Secondary | ICD-10-CM | POA: Diagnosis not present

## 2019-09-07 DIAGNOSIS — Z992 Dependence on renal dialysis: Secondary | ICD-10-CM

## 2019-09-07 DIAGNOSIS — Z79890 Hormone replacement therapy (postmenopausal): Secondary | ICD-10-CM

## 2019-09-07 DIAGNOSIS — Z9841 Cataract extraction status, right eye: Secondary | ICD-10-CM

## 2019-09-07 DIAGNOSIS — I5043 Acute on chronic combined systolic (congestive) and diastolic (congestive) heart failure: Secondary | ICD-10-CM | POA: Diagnosis present

## 2019-09-07 DIAGNOSIS — Z79899 Other long term (current) drug therapy: Secondary | ICD-10-CM

## 2019-09-07 DIAGNOSIS — Z87891 Personal history of nicotine dependence: Secondary | ICD-10-CM

## 2019-09-07 DIAGNOSIS — G4733 Obstructive sleep apnea (adult) (pediatric): Secondary | ICD-10-CM | POA: Diagnosis present

## 2019-09-07 DIAGNOSIS — I5023 Acute on chronic systolic (congestive) heart failure: Secondary | ICD-10-CM

## 2019-09-07 DIAGNOSIS — Z794 Long term (current) use of insulin: Secondary | ICD-10-CM

## 2019-09-07 DIAGNOSIS — L409 Psoriasis, unspecified: Secondary | ICD-10-CM | POA: Diagnosis present

## 2019-09-07 DIAGNOSIS — I509 Heart failure, unspecified: Secondary | ICD-10-CM

## 2019-09-07 DIAGNOSIS — Z8614 Personal history of Methicillin resistant Staphylococcus aureus infection: Secondary | ICD-10-CM

## 2019-09-07 LAB — BASIC METABOLIC PANEL
Anion gap: 13 (ref 5–15)
BUN: 28 mg/dL — ABNORMAL HIGH (ref 8–23)
CO2: 26 mmol/L (ref 22–32)
Calcium: 8.7 mg/dL — ABNORMAL LOW (ref 8.9–10.3)
Chloride: 99 mmol/L (ref 98–111)
Creatinine, Ser: 3.49 mg/dL — ABNORMAL HIGH (ref 0.44–1.00)
GFR calc Af Amer: 15 mL/min — ABNORMAL LOW (ref >60)
GFR calc non Af Amer: 13 mL/min — ABNORMAL LOW (ref >60)
Glucose, Bld: 178 mg/dL — ABNORMAL HIGH (ref 70–99)
Potassium: 4.5 mmol/L (ref 3.5–5.1)
Sodium: 138 mmol/L (ref 135–145)

## 2019-09-07 LAB — BRAIN NATRIURETIC PEPTIDE: B Natriuretic Peptide: 679.2 pg/mL — ABNORMAL HIGH (ref 0.0–100.0)

## 2019-09-07 LAB — CBC
HCT: 36.9 % (ref 36.0–46.0)
Hemoglobin: 11.4 g/dL — ABNORMAL LOW (ref 12.0–15.0)
MCH: 28.1 pg (ref 26.0–34.0)
MCHC: 30.9 g/dL (ref 30.0–36.0)
MCV: 91.1 fL (ref 80.0–100.0)
Platelets: 212 10*3/uL (ref 150–400)
RBC: 4.05 MIL/uL (ref 3.87–5.11)
RDW: 15.3 % (ref 11.5–15.5)
WBC: 8.4 10*3/uL (ref 4.0–10.5)
nRBC: 0.2 % (ref 0.0–0.2)

## 2019-09-07 LAB — TSH: TSH: 5.687 u[IU]/mL — ABNORMAL HIGH (ref 0.350–4.500)

## 2019-09-07 LAB — TROPONIN I (HIGH SENSITIVITY)
Troponin I (High Sensitivity): 53 ng/L — ABNORMAL HIGH (ref <18)
Troponin I (High Sensitivity): 51 ng/L — ABNORMAL HIGH (ref <18)

## 2019-09-07 LAB — SARS CORONAVIRUS 2 BY RT PCR (HOSPITAL ORDER, PERFORMED IN ~~LOC~~ HOSPITAL LAB): SARS Coronavirus 2: NEGATIVE

## 2019-09-07 LAB — GLUCOSE, CAPILLARY: Glucose-Capillary: 113 mg/dL — ABNORMAL HIGH (ref 70–99)

## 2019-09-07 MED ORDER — LEVOTHYROXINE SODIUM 88 MCG PO TABS
88.0000 ug | ORAL_TABLET | Freq: Every day | ORAL | Status: DC
  Administered 2019-09-08 – 2019-09-10 (×3): 88 ug via ORAL
  Filled 2019-09-07 (×4): qty 1

## 2019-09-07 MED ORDER — ZOLPIDEM TARTRATE 5 MG PO TABS
5.0000 mg | ORAL_TABLET | Freq: Every evening | ORAL | Status: DC | PRN
  Administered 2019-09-08 – 2019-09-09 (×2): 5 mg via ORAL
  Filled 2019-09-07 (×2): qty 1

## 2019-09-07 MED ORDER — FUROSEMIDE 10 MG/ML IJ SOLN
80.0000 mg | Freq: Once | INTRAMUSCULAR | Status: AC
  Administered 2019-09-07: 80 mg via INTRAVENOUS
  Filled 2019-09-07: qty 8

## 2019-09-07 MED ORDER — FUROSEMIDE 10 MG/ML IJ SOLN
40.0000 mg | Freq: Two times a day (BID) | INTRAMUSCULAR | Status: DC
  Administered 2019-09-08 – 2019-09-10 (×4): 40 mg via INTRAVENOUS
  Filled 2019-09-07 (×5): qty 4

## 2019-09-07 MED ORDER — OXYCODONE HCL 5 MG PO TABS
5.0000 mg | ORAL_TABLET | Freq: Three times a day (TID) | ORAL | Status: DC | PRN
  Administered 2019-09-07 – 2019-09-10 (×5): 5 mg via ORAL
  Filled 2019-09-07 (×5): qty 1

## 2019-09-07 MED ORDER — INSULIN LISPRO (1 UNIT DIAL) 100 UNIT/ML (KWIKPEN): 22.0000 [IU] | PEN_INJECTOR | SUBCUTANEOUS | Status: DC

## 2019-09-07 MED ORDER — SODIUM CHLORIDE 0.9% FLUSH
3.0000 mL | Freq: Two times a day (BID) | INTRAVENOUS | Status: DC
  Administered 2019-09-08 – 2019-09-09 (×3): 3 mL via INTRAVENOUS

## 2019-09-07 MED ORDER — ATORVASTATIN CALCIUM 20 MG PO TABS
40.0000 mg | ORAL_TABLET | Freq: Every day | ORAL | Status: DC
  Administered 2019-09-07 – 2019-09-10 (×4): 40 mg via ORAL
  Filled 2019-09-07 (×4): qty 2

## 2019-09-07 MED ORDER — ONDANSETRON HCL 4 MG/2ML IJ SOLN: 4.0000 mg | Freq: Four times a day (QID) | INTRAMUSCULAR | Status: DC | PRN

## 2019-09-07 MED ORDER — APIXABAN 5 MG PO TABS
5.0000 mg | ORAL_TABLET | Freq: Two times a day (BID) | ORAL | Status: DC
  Administered 2019-09-07 – 2019-09-10 (×6): 5 mg via ORAL
  Filled 2019-09-07 (×6): qty 1

## 2019-09-07 MED ORDER — INSULIN GLARGINE 100 UNIT/ML ~~LOC~~ SOLN
45.0000 [IU] | Freq: Every day | SUBCUTANEOUS | Status: DC
  Filled 2019-09-07: qty 0.45

## 2019-09-07 MED ORDER — ACETAMINOPHEN 325 MG PO TABS: 650.0000 mg | ORAL_TABLET | ORAL | Status: DC | PRN

## 2019-09-07 MED ORDER — BETAMETHASONE VALERATE 0.1 % EX OINT
1.0000 "application " | TOPICAL_OINTMENT | Freq: Two times a day (BID) | CUTANEOUS | Status: DC | PRN
  Filled 2019-09-07: qty 15

## 2019-09-07 MED ORDER — SODIUM CHLORIDE 0.9 % IV SOLN: 250.0000 mL | INTRAVENOUS | Status: DC | PRN

## 2019-09-07 MED ORDER — SODIUM CHLORIDE 0.9% FLUSH: 3.0000 mL | INTRAVENOUS | Status: DC | PRN

## 2019-09-07 MED ORDER — DUPILUMAB 300 MG/2ML ~~LOC~~ SOSY: 300.0000 mg | PREFILLED_SYRINGE | SUBCUTANEOUS | Status: DC

## 2019-09-07 MED ORDER — POLYETHYLENE GLYCOL 3350 17 G PO PACK: 17.0000 g | PACK | Freq: Every day | ORAL | Status: DC | PRN

## 2019-09-07 MED ORDER — INSULIN ASPART 100 UNIT/ML ~~LOC~~ SOLN
0.0000 [IU] | Freq: Three times a day (TID) | SUBCUTANEOUS | Status: DC
  Administered 2019-09-08: 1 [IU] via SUBCUTANEOUS
  Administered 2019-09-08 – 2019-09-09 (×4): 2 [IU] via SUBCUTANEOUS
  Administered 2019-09-10: 1 [IU] via SUBCUTANEOUS
  Administered 2019-09-10: 3 [IU] via SUBCUTANEOUS
  Filled 2019-09-07 (×7): qty 1

## 2019-09-07 MED ORDER — DULOXETINE HCL 30 MG PO CPEP
30.0000 mg | ORAL_CAPSULE | Freq: Every day | ORAL | Status: DC
  Administered 2019-09-09 – 2019-09-10 (×2): 30 mg via ORAL
  Filled 2019-09-07 (×3): qty 1

## 2019-09-07 MED ORDER — VITAMIN D (ERGOCALCIFEROL) 1.25 MG (50000 UNIT) PO CAPS: 50000.0000 [IU] | ORAL_CAPSULE | ORAL | Status: DC

## 2019-09-07 MED ORDER — AMIODARONE HCL 200 MG PO TABS
200.0000 mg | ORAL_TABLET | Freq: Every day | ORAL | Status: DC
  Administered 2019-09-09 – 2019-09-10 (×2): 200 mg via ORAL
  Filled 2019-09-07 (×3): qty 1

## 2019-09-07 MED ORDER — ALPRAZOLAM 0.25 MG PO TABS
0.2500 mg | ORAL_TABLET | Freq: Two times a day (BID) | ORAL | Status: DC | PRN
  Filled 2019-09-07: qty 1

## 2019-09-07 NOTE — ED Provider Notes (Signed)
[facility] Emergency Department Provider Note    First MD Initiated Contact with Patient 09/07/19 1731     (approximate)  I have reviewed the triage vital signs and the nursing notes.   HISTORY  Chief Complaint Shortness of Breath    HPI [name] is a 69 y.o. female [place] past medical history presents to the ER for evaluation of worsening exertional dyspnea malaise.  Does endorse some worsening orthopnea.  Does receive 1 Wednesday Friday dialysis.  Does still make urine.  Did receive dialysis yesterday.  She denies any chest pain.  States that she is supposed to wear home oxygen but does not wear it at home.    Past Medical History:  Diagnosis Date  . Anemia   . Anxiety   . Aortic valve stenosis   . Arrhythmia    atrial fibrillation  . Arthritis    feet, legs  . B12 deficiency   . Bowel obstruction ([place])   . CHF (congestive heart failure) ([place])   . CKD (chronic kidney disease)    protein in urine  . Colostomy in place [facility])   . Diabetes mellitus without complication ([place])   . Dialysis patient [facility])    T/Th/Sa  . Diverticulitis large intestine   . Dysrhythmia   . Ectopic atrial tachycardia ([place])   . FSGS (focal segmental glomerulosclerosis)   . Gastritis   . GI bleed   . Heart murmur   . Hyperlipidemia   . Hypertension   . Hypothyroidism   . Hypothyroidism    unspecified  . MRSA (methicillin resistant Staphylococcus aureus)    at abdominal wound.  Jan 2017.  Treated.   . Neuropathy   . Neuropathy in diabetes ([place])   . Obesity   . Pedal edema   . Psoriasis   . Shortness of breath dyspnea   . Sleep apnea    CPAP  . Vertigo    Family History  Problem Relation Age of Onset  . Diabetes Mother   . Hypertension Father   . CAD Father   . Heart attack Father   . Colon cancer Brother    Past Surgical History:  Procedure Laterality Date  . ABDOMINAL HYSTERECTOMY    . APPLICATION OF WOUND VAC N/A 02/24/2015   Procedure:  APPLICATION OF WOUND VAC;  Surgeon: Clayburn Pert, MD;  Location: [facility] ORS;  Service: General;  Laterality: N/A;  . APPLICATION OF WOUND VAC N/A 02/26/2015   Procedure: APPLICATION OF WOUND VAC;  Surgeon: Clayburn Pert, MD;  Location: [facility];  Service: General;  Laterality: N/A;  . AV FISTULA PLACEMENT Left 01/29/2019   Procedure: ARTERIOVENOUS (AV) FISTULA CREATION ( BRACHIAL CEPHALIC);  Surgeon: Katha Cabal, MD;  Location: [facility] OR[facility];  Service: Vascular;  Laterality: Left;  . BACK SURGERY     spur frmoved from lower back  . CARDIAC CATHETERIZATION    . CARDIAC VALVE REPLACEMENT  08/2017   [facility]  . CARDIOVERSION N/A 07/15/2018   Procedure: CARDIOVERSION;  Surgeon: Corey Skains, MD;  Location: [facility];  Service: Cardiovascular;  Laterality: N/A;  . CATARACT EXTRACTION W/PHACO Right 11/16/2018   Procedure: CATARACT EXTRACTION PHACO AND INTRAOCULAR LENS PLACEMENT (IOC) RIGHT DIABETiC  01:18.3  13.5%  10.82;  Surgeon: Eulogio Bear, MD;  Location: [place];  Service: Ophthalmology;  Laterality: Right;  Diabetic - insulin sleep apnea  . CATARACT EXTRACTION W/PHACO Left 12/07/2018   Procedure: CATARACT EXTRACTION PHACO AND INTRAOCULAR LENS PLACEMENT (IOC) LEFT  DIABETIC 01:02.6  8.8%  5.77;  Surgeon: Eulogio Bear, MD;  Location: [place];  Service: Ophthalmology;  Laterality: Left;  Diabetic - insulin  . CHOLECYSTECTOMY    . COLECTOMY    . COLONOSCOPY WITH PROPOFOL N/A 09/04/2016   Procedure: COLONOSCOPY WITH PROPOFOL;  Surgeon: Manya Silvas, MD;  Location: [facility] END[facility]COPY;  Service: Endoscopy;  Laterality: N/A;  . COLOSTOMY REVERSAL N/A 07/12/2015   Procedure: COLOSTOMY REVERSAL;  Surgeon: Clayburn Pert, MD;  Location: [facility];  Service: General;  Laterality: N/A;  . DEBRIDEMENT OF ABDOMINAL WALL ABSCESS N/A 02/22/2015   Procedure: DEBRIDEMENT OF ABDOMINAL WALL ABSCESS;  Surgeon: Clayburn Pert, MD;  Location: [facility];  Service: General;   Laterality: N/A;  . DIALYSIS/PERMA CATHETER INSERTION N/A 10/20/2018   Procedure: DIALYSIS/PERMA CATHETER INSERTION;  Surgeon: Katha Cabal, MD;  Location: [place] CV [facility];  Service: Cardiovascular;  Laterality: N/A;  . DIALYSIS/PERMA CATHETER REMOVAL N/A 08/03/2019   Procedure: DIALYSIS/PERMA CATHETER REMOVAL;  Surgeon: Katha Cabal, MD;  Location: [place] [facility];  Service: Cardiovascular;  Laterality: N/A;  . EXCISION MASS ABDOMINAL N/A 02/24/2015   Procedure: EXCISION MASS ABDOMINAL  / [place];  Surgeon: Clayburn Pert, MD;  Location: [facility];  Service: General;  Laterality: N/A;  . EXCISION MASS ABDOMINAL N/A 02/26/2015   Procedure: EXCISION MASS ABDOMINAL/wash out;  Surgeon: Clayburn Pert, MD;  Location: [facility];  Service: General;  Laterality: N/A;  . FLEXIBLE SIGMOIDOSCOPY  06/19/2015   Procedure: FLEXIBLE SIGMOIDOSCOPY;  Surgeon: Lucilla Lame, MD;  Location: [place];  Service: Endoscopy;;  UNABLE TO ACCESS OSTOMY SITE FOR ACCESS INTO COLON  . HERNIA REPAIR     umbilical  . LAPAROSCOPY  07/12/2015   Procedure: LAPAROSCOPY DIAGNOSTIC;  Surgeon: Clayburn Pert, MD;  Location: [facility];  Service: General;;  . LAPAROTOMY  07/12/2015   Procedure: EXPLORATORY LAPAROTOMY;  Surgeon: Clayburn Pert, MD;  Location: [facility];  Service: General;;  . LAPAROTOMY N/A 02/06/2015   Procedure: Laparotomy, reduction of incarcerated parastomal hernia, repair of parastomal hernia with mesh;  Surgeon: Sherri Rad, MD;  Location: [facility];  Service: General;  Laterality: N/A;  . laparotomy closure of cecal perforation  05/09/2013   Dr. Marina Gravel  . LYSIS OF ADHESION  07/12/2015   Procedure: LYSIS OF ADHESION;  Surgeon: Clayburn Pert, MD;  Location: [facility];  Service: General;;  . TONSILLECTOMY    . WOUND DEBRIDEMENT N/A 03/03/2015   Procedure: DEBRIDEMENT ABDOMINAL WOUND;  Surgeon: Florene Glen, MD;  Location: [facility];  Service: General;  Laterality: N/A;  . WOUND DEBRIDEMENT  N/A 03/09/2015   Procedure: DEBRIDEMENT ABDOMINAL WOUND;  Surgeon: Florene Glen, MD;  Location: [facility];  Service: General;  Laterality: N/A;   Patient Active Problem List   Diagnosis Date Noted  . Pharmacologic therapy 06/30/2019  . Chronic respiratory failure with hypoxia ([place]) 06/22/2019  . Hypoxia 06/10/2019  . Spondylosis without myelopathy or radiculopathy, lumbosacral region 11/05/2018  . DDD (degenerative disc disease), lumbosacral 11/05/2018  . Hematochezia 09/03/2018  . Hemorrhoids, internal 09/03/2018  . Proctitis 09/03/2018  . Obstructive sleep apnea 08/24/2018  . Acute respiratory failure with hypoxia ([place]) 07/12/2018  . DM type 2 with diabetic peripheral neuropathy ([place]) 11/25/2017  . CKD (chronic kidney disease) stage 5, GFR less than 15 ml/min (HCC) 10/05/2017  . 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 Acute kidney injury superimposed on chronic kidney disease ([place]) 09/01/2017  . Chronic systolic CHF (congestive heart failure) (  [place]) 08/19/2017  . Paroxysmal A-fib ([place]) 08/14/2017  . Cervical myelopathy ([place]) 08/07/2017  . Onychomycosis of multiple toenails with type 2 diabetes mellitus and peripheral neuropathy ([place]) 06/23/2017  . Myofascial pain 05/27/2017  . Chronic pain of left upper extremity 05/27/2017  . Morbid obesity ([place]) 05/06/2017  . Uncontrolled type 2 diabetes mellitus with hyperglycemia, with long-term current use of insulin ([place]) 02/07/2017  . Hyperlipidemia, mixed 08/08/2016  . Hx of adenomatous colonic polyps 06/14/2016  . 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 Chronic pain syndrome 03/11/2016  . Obesity, Class II, BMI 35-39.9 03/11/2016  . Osteopenia of multiple sites 01/26/2016  . Heart burn 12/18/2015  . Polyneuropathy 11/16/2015  . Lumbar spondylosis 09/25/2015  . Lumbar facet syndrome (Bilateral) (L>R) 09/25/2015  . Recurrent major depressive disorder, in full remission ([place]) 09/19/2015  .  Diabetic neuropathy ([place]) 09/06/2015  . H/O colostomy 07/12/2015  . Long term current use of opiate analgesic 07/03/2015  . Long term prescription opiate use 07/03/2015  . Opiate use (22.5 MME/Day) 07/03/2015  . Encounter for therapeutic drug level monitoring 07/03/2015  . Encounter for pain management planning 07/03/2015  . Neurogenic pain 07/03/2015  . Chronic musculoskeletal pain 07/03/2015  . Visceral abdominal pain 07/03/2015  . Diabetic peripheral neuropathy ([place]) 07/03/2015  . Chronic low back pain (Primary Area of Pain) (Bilateral) (L>R) 07/03/2015  . Chronic lower extremity pain (Secondary area of Pain) (Bilateral) (R>L) 07/03/2015  . Chronic hip pain (Bilateral) (L>R) 07/03/2015  . Chronic neck pain (Bilateral) (L>R) 07/03/2015  . Anemia 02/16/2015  . Arthritis 02/16/2015  . End stage renal disease ([place]) 02/16/2015  . Focal and segmental hyalinosis 02/16/2015  . Bleeding gastrointestinal 02/16/2015  . Acquired atrophy of thyroid 02/16/2015  . Psoriasis 02/16/2015  . Small bowel obstruction ([place]) 02/04/2015  . Severe aortic stenosis 10/11/2014  . Paroxysmal supraventricular tachycardia ([place]) 10/11/2014  . SOBOE (shortness of breath on exertion) 10/11/2014  . Benign essential HTN 10/04/2014  . Obesity 09/05/2014  . B12 deficiency 03/02/2014  . Diabetes mellitus type 2 in obese ([place]) 12/01/2013  . Abnormal presence of protein in urine 10/15/2010      Prior to Admission medications   Medication Sig Start Date End Date Taking? Authorizing Provider  acetaminophen (TYLENOL) 500 MG tablet Take 2 tablets (1,000 mg total) by mouth every 8 (eight) hours. Patient not taking: Reported on 06/30/2019 06/14/19   Mercy Riding, MD  amiodarone (PACERONE) 200 MG tablet Take 200 mg by mouth daily.  06/29/18   [provider]  apixaban (ELIQUIS) 5 MG TABS tablet Take 5 mg by mouth 2 (two) times daily.    [provider]  atorvastatin (LIPITOR) 40 MG tablet Take 40 mg by  mouth daily.  04/19/15   [provider]  betamethasone valerate (VALISONE) 0.1 % cream Apply 1 application topically 2 (two) times daily as needed (psoriasis).     [provider]  DULoxetine (CYMBALTA) 30 MG capsule Take 30 mg by mouth daily.  01/18/15   [provider]  DUPIXENT 300 MG/2ML SOSY Inject 300 mg into the skin every 14 (fourteen) days. 05/28/18   [provider]  HUMALOG KWIKPEN 100 UNIT/ML KwikPen 6 units subcutaneous injection prior to meals Patient taking differently: Inject 22-38 Units into the skin See admin instructions. Inject 22 units in the morning & 38 unit at supper 07/15/18   Loletha Grayer, MD  insulin glargine (LANTUS) 100 unit/mL SOPN Inject 45 Units into the skin at bedtime.  [provider]  levothyroxine (SYNTHROID, LEVOTHROID) 88 MCG tablet Take 88 mcg by mouth daily before breakfast.  09/11/15   [provider]  oxyCODONE (OXY IR/ROXICODONE) 5 MG immediate release tablet Take 1 tablet (5 mg total) by mouth every 8 (eight) hours as needed for severe pain. Must last 30 days 09/01/19 10/01/19  Milinda Pointer, MD  polyethylene glycol (MIRALAX / GLYCOLAX) 17 g packet Take 17 g by mouth daily as needed for moderate constipation.    [provider]  torsemide (DEMADEX) 20 MG tablet Take 40 mg by mouth daily.     [provider]  Vitamin D, Ergocalciferol, (DRISDOL) 1.25 MG (50000 UT) CAPS capsule Take 50,000 Units by mouth every Tuesday.    [provider]    Allergies Buspirone, Diltiazem hcl, Gabapentin, Hydralazine, Lisinopril, Lovastatin, Metformin, Pravastatin, Sitagliptin, and Tramadol    Social History Social History   Tobacco Use  . Smoking status: Former Smoker    Types: Cigarettes    Quit date: 04/18/2013    Years since quitting: 6.3  . Smokeless tobacco: Never Used  Vaping Use  . Vaping Use: Never used  Substance Use Topics  . Alcohol use: No    Alcohol/week: 0.0  standard drinks  . Drug use: No    Review of Systems Patient denies headaches, rhinorrhea, blurry vision, numbness, shortness of breath, chest pain, edema, cough, abdominal pain, nausea, vomiting, diarrhea, dysuria, fevers, rashes or hallucinations unless otherwise stated above in HPI. ____________________________________________   PHYSICAL EXAM:  VITAL SIGNS: Vitals:   09/07/19 1517 09/07/19 1900  BP: 113/78 (!) 122/95  Pulse: (!) 110 (!) 117  Resp: (!) 22 (!) 22  Temp: 98.2 F (36.8 C)   SpO2: 94% 92%    Constitutional: Alert and oriented. Morbidly obese  Eyes: Conjunctivae are normal.  Head: Atraumatic. Nose: No congestion/rhinnorhea. Mouth/Throat: Mucous membranes are moist.   Neck: No stridor. Painless ROM.  Cardiovascular: Normal rate, irregular rhythm. Grossly normal heart sounds.  Good peripheral circulation. Respiratory: Normal respiratory effort.  No retractions. Lungs CTAB. Gastrointestinal: Soft and nontender. No distention. No abdominal bruits. No CVA tenderness. Genitourinary:  Musculoskeletal: No lower extremity tenderness. Trace BLE edema.  No joint effusions. Neurologic:  Normal speech and language. No gross focal neurologic deficits are appreciated. No facial droop Skin:  Skin is warm, dry and intact. No rash noted. Psychiatric: Mood and affect are normal. Speech and behavior are normal.  ____________________________________________   LABS (all labs ordered are listed, but only abnormal results are displayed)  Results for orders placed or performed during the hospital encounter of 09/07/19 (from the past 24 hour(s))  Basic metabolic panel     Status: Abnormal   Collection Time: 09/07/19  3:22 PM  Result Value Ref Range   Sodium 138 135 - 145 mmol/L   Potassium 4.5 3.5 - 5.1 mmol/L   Chloride 99 98 - 111 mmol/L   CO2 26 22 - 32 mmol/L   Glucose, Bld 178 (H) 70 - 99 mg/dL   BUN 28 (H) 8 - 23 mg/dL   Creatinine, Ser 3.49 (H) 0.44 - 1.00 mg/dL    Calcium 8.7 (L) 8.9 - 10.3 mg/dL   GFR calc non Af Amer 13 (L) >60 mL/min   GFR calc Af Amer 15 (L) >60 mL/min   Anion gap 13 5 - 15  CBC     Status: Abnormal   Collection Time: 09/07/19  3:22 PM  Result Value Ref Range   WBC 8.4 4.0 -  10.5 K/uL   RBC 4.05 3.87 - 5.11 MIL/uL   Hemoglobin 11.4 (L) 12.0 - 15.0 g/dL   HCT 36.9 36 - 46 %   MCV 91.1 80.0 - 100.0 fL   MCH 28.1 26.0 - 34.0 pg   MCHC 30.9 30.0 - 36.0 g/dL   RDW 15.3 11.5 - 15.5 %   Platelets 212 150 - 400 K/uL   nRBC 0.2 0.0 - 0.2 %  Troponin I (High Sensitivity)     Status: Abnormal   Collection Time: 09/07/19  3:22 PM  Result Value Ref Range   Troponin I (High Sensitivity) 53 (H) <18 ng/L  Brain natriuretic peptide     Status: Abnormal   Collection Time: 09/07/19  3:22 PM  Result Value Ref Range   B Natriuretic Peptide 679.2 (H) 0.0 - 100.0 pg/mL   ____________________________________________  EKG My review and personal interpretation at Time: 15:18   Indication: sob  Rate: 110  Rhythm: afib Axis: normal Other: nonspecific st abn, no stemi ____________________________________________  RADIOLOGY  I personally reviewed all radiographic images ordered to evaluate for the above acute complaints and reviewed radiology reports and findings.  These findings were personally discussed with the patient.  Please see medical record for radiology report.  ____________________________________________   PROCEDURES  Procedure(s) performed:  Procedures    Critical Care performed: no ____________________________________________   INITIAL IMPRESSION / ASSESSMENT AND PLAN / ED COURSE  Pertinent labs & imaging results that were available during my care of the patient were reviewed by me and considered in my medical decision making (see chart for details).   DDX: Asthma, copd, CHF, pna, ptx, malignancy, Pe, anemia   STELA IWASAKI is a 69 y.o. who presents to the ED with extensive past medical history presenting with  worsening dyspnea.  Does have some mild edema and signs of congestive heart failure.  She mild tachypnea do not hear any wheezing on exam.  Blood work is roughly at baseline.  Did have some mild tachycardia and shortness of breath with exertion desaturating down to the 90%.  She is already on Eliquis for for history of A. fib.  Not consistent with PE.  I do not see any signs of infectious process.  Clinical Course as of Sep 07 2007  Tue Sep 07, 2019  1913 Patient's troponin chronically elevated appears roughly stable.  BNP also mildly elevated.  Discussed case with Dr. Juleen China who states that they have been dialyzing in clinic to dry weight around 1 12-1 14 and is suspicious that the patient has lost body weight and has not been dialyzed to remove additional fluid.  She is otherwise well-appearing at this time will give a dose of IV Lasix and reassess.  May require hospitalization for additional diuresis.    [PR]  2006 Patient still feeling significant dyspnea in excess of her baseline.  Was given aggressive IV diuresis with minimal output.  Does not feel comfortable going home at this point will discuss with hospitalist for observation for additional diuresis and management.   [PR]    Clinical Course User Index [PR] Merlyn Lot, MD    The patient was evaluated in Emergency Department today for the symptoms described in the history of present illness. He/she was evaluated in the context of the global COVID-19 pandemic, which necessitated consideration that the patient might be at risk for infection with the SARS-CoV-2 virus that causes COVID-19. Institutional protocols and algorithms that pertain to the evaluation of patients at risk for  COVID-19 are in a state of rapid change based on information released by regulatory bodies including the CDC and federal and state organizations. These policies and algorithms were followed during the patient's care in the ED.  As part of my medical decision  making, I reviewed the following data within the [place] notes reviewed and incorporated, Labs reviewed, notes from prior ED visits and [place] Controlled Substance Database   ____________________________________________   FINAL CLINICAL IMPRESSION(S) / ED DIAGNOSES  Final diagnoses:  SOB (shortness of breath)  Acute on chronic congestive heart failure, unspecified heart failure type ([place])      NEW MEDICATIONS STARTED DURING THIS VISIT:  New Prescriptions   No medications on file     Note:  This document was prepared using Dragon voice recognition software and may include unintentional dictation errors.    Merlyn Lot, MD 09/07/19 2009

## 2019-09-07 NOTE — H&P (Addendum)
[place] [facility] [place] NAME: [name]    MR#:  [id number]  DATE OF BIRTH:  11/04/1950  DATE OF ADMISSION:  09/07/2019  PRIMARY CARE PHYSICIAN: Adin Hector, MD   REQUESTING/REFERRING PHYSICIAN: Merlyn Lot, MD  CHIEF COMPLAINT:   Chief Complaint  Patient presents with  . Shortness of Breath    HISTORY OF PRESENT ILLNESS:  [name]  is a 70 y.o. Caucasian female with a known history of end-stage renal disease on hemodialysis, atrial fibrillation, and exam CHF vitamin B12 deficiency, hypertension and hypothyroidism, who presented to the emergency room with acute onset of worsening dyspnea as well as orthopnea and dyspnea on exertion.  She denied any worsening lower extremity edema.  She has been having paroxysmal nocturnal dyspnea and admits to wheezing in a.m. without cough.  No fever or chills.  No chest pain or palpitations.  She makes some urine and denies any dysuria or urinary frequency however admits to mild urgency.  The patient had hemodialysis yesterday.  Upon presentation to the emergency room, heart rate was 110 and respiratory rate 22 with otherwise normal vital signs.  Labs revealed BUN of 28 and creatinine 3.49.  BNP was 679.2 high-sensitivity troponin I was 53 and later 51.  CBC showed hemoglobin of 11.3 hematocrit of 36.9 close to previous levels.  COVID-19 PCR came back negative.  Chest x-ray showed cardiomegaly with vascular congestion and mild pulmonary edema. EKG showed accelerated junctional rhythm with a rate of 112 with PVCs and low voltage QRS.  The patient was given 80 mg of IV Lasix.  Dr. Juleen China was notified about the patient. He will be admitted to an observation progressive unit bed for further evaluation and management. PAST MEDICAL HISTORY:   Past Medical History:  Diagnosis Date  . Anemia   . Anxiety   . Aortic valve stenosis   . Arrhythmia    atrial fibrillation  . Arthritis    feet, legs  . B12  deficiency   . Bowel obstruction ([place])   . CHF (congestive heart failure) ([place])   . CKD (chronic kidney disease)    protein in urine  . Colostomy in place [facility])   . Diabetes mellitus without complication ([place])   . Dialysis patient [facility])    T/Th/Sa  . Diverticulitis large intestine   . Dysrhythmia   . 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 FSGS (focal segmental glomerulosclerosis)   . Gastritis   . GI bleed   . Heart murmur   . Hyperlipidemia   . Hypertension   . Hypothyroidism   . Hypothyroidism    unspecified  . MRSA (methicillin resistant Staphylococcus aureus)    at abdominal wound.  Naylin Burkle 2017.  Treated.   . Neuropathy   . Neuropathy in diabetes ([place])   . Obesity   . Pedal edema   . Psoriasis   . Shortness of breath dyspnea   . Sleep apnea    CPAP  . Vertigo     PAST SURGICAL HISTORY:   Past Surgical History:  Procedure Laterality Date  . ABDOMINAL HYSTERECTOMY    . APPLICATION OF WOUND VAC N/A 02/24/2015   Procedure: APPLICATION OF WOUND VAC;  Surgeon: Clayburn Pert, MD;  Location: [facility] ORS;  Service: General;  Laterality: N/A;  . APPLICATION OF WOUND VAC N/A 02/26/2015   Procedure: APPLICATION OF WOUND VAC;  Surgeon: Clayburn Pert, MD;  Location: [facility];  Service: General;  Laterality: N/A;  .  AV FISTULA PLACEMENT Left 01/29/2019   Procedure: ARTERIOVENOUS (AV) FISTULA CREATION ( BRACHIAL CEPHALIC);  Surgeon: Katha Cabal, MD;  Location: [facility] OR[facility];  Service: Vascular;  Laterality: Left;  . BACK SURGERY     spur frmoved from lower back  . CARDIAC CATHETERIZATION    . CARDIAC VALVE REPLACEMENT  08/2017   [facility]  . CARDIOVERSION N/A 07/15/2018   Procedure: CARDIOVERSION;  Surgeon: Corey Skains, MD;  Location: [facility] OR[facility];  Service: Cardiovascular;  Laterality: N/A;  . CATARACT EXTRACTION W/PHACO Right 11/16/2018   Procedure: CATARACT EXTRACTION PHACO AND INTRAOCULAR LENS PLACEMENT (IOC) RIGHT DIABETiC  01:18.3  13.5%  10.82;  Surgeon: Eulogio Bear, MD;   Location: [place];  Service: Ophthalmology;  Laterality: Right;  Diabetic - insulin sleep apnea  . CATARACT EXTRACTION W/PHACO Left 12/07/2018   Procedure: CATARACT EXTRACTION PHACO AND INTRAOCULAR LENS PLACEMENT (IOC) LEFT DIABETIC 01:02.6  8.8%  5.77;  Surgeon: Eulogio Bear, MD;  Location: [place];  Service: Ophthalmology;  Laterality: Left;  Diabetic - insulin  . CHOLECYSTECTOMY    . COLECTOMY    . COLONOSCOPY WITH PROPOFOL N/A 09/04/2016   Procedure: COLONOSCOPY WITH PROPOFOL;  Surgeon: Manya Silvas, MD;  Location: [facility] ENDOSCOPY;  Service: Endoscopy;  Laterality: N/A;  . COLOSTOMY REVERSAL N/A 07/12/2015   Procedure: COLOSTOMY REVERSAL;  Surgeon: Clayburn Pert, MD;  Location: [facility];  Service: General;  Laterality: N/A;  . DEBRIDEMENT OF ABDOMINAL WALL ABSCESS N/A 02/22/2015   Procedure: DEBRIDEMENT OF ABDOMINAL WALL ABSCESS;  Surgeon: Clayburn Pert, MD;  Location: [facility];  Service: General;  Laterality: N/A;  . DIALYSIS/PERMA CATHETER INSERTION N/A 10/20/2018   Procedure: DIALYSIS/PERMA CATHETER INSERTION;  Surgeon: Katha Cabal, MD;  Location: [place] CV LAB;  Service: Cardiovascular;  Laterality: N/A;  . DIALYSIS/PERMA CATHETER REMOVAL N/A 08/03/2019   Procedure: DIALYSIS/PERMA CATHETER REMOVAL;  Surgeon: Katha Cabal, MD;  Location: [place] CV LAB;  Service: Cardiovascular;  Laterality: N/A;  . EXCISION MASS ABDOMINAL N/A 02/24/2015   Procedure: EXCISION MASS ABDOMINAL  / [place];  Surgeon: Clayburn Pert, MD;  Location: [facility];  Service: General;  Laterality: N/A;  . EXCISION MASS ABDOMINAL N/A 02/26/2015   Procedure: EXCISION MASS ABDOMINAL/wash out;  Surgeon: Clayburn Pert, MD;  Location: [facility];  Service: General;  Laterality: N/A;  . FLEXIBLE SIGMOIDOSCOPY  06/19/2015   Procedure: FLEXIBLE SIGMOIDOSCOPY;  Surgeon: Lucilla Lame, MD;  Location: [place];  Service: Endoscopy;;  UNABLE TO ACCESS OSTOMY SITE FOR  ACCESS INTO COLON  . HERNIA REPAIR     umbilical  . LAPAROSCOPY  07/12/2015   Procedure: LAPAROSCOPY DIAGNOSTIC;  Surgeon: Clayburn Pert, MD;  Location: [facility];  Service: General;;  . LAPAROTOMY  07/12/2015   Procedure: EXPLORATORY LAPAROTOMY;  Surgeon: Clayburn Pert, MD;  Location: [facility];  Service: General;;  . LAPAROTOMY N/A 02/06/2015   Procedure: Laparotomy, reduction of incarcerated parastomal hernia, repair of parastomal hernia with mesh;  Surgeon: Sherri Rad, MD;  Location: [facility];  Service: General;  Laterality: N/A;  . laparotomy closure of cecal perforation  05/09/2013   Dr. Marina Gravel  . LYSIS OF ADHESION  07/12/2015   Procedure: LYSIS OF ADHESION;  Surgeon: Clayburn Pert, MD;  Location: [facility];  Service: General;;  . TONSILLECTOMY    . WOUND DEBRIDEMENT N/A 03/03/2015   Procedure: DEBRIDEMENT ABDOMINAL WOUND;  Surgeon: Florene Glen, MD;  Location: [facility];  Service: General;  Laterality: N/A;  . WOUND DEBRIDEMENT N/A  03/09/2015   Procedure: DEBRIDEMENT ABDOMINAL WOUND;  Surgeon: Florene Glen, MD;  Location: [facility];  Service: General;  Laterality: N/A;    SOCIAL HISTORY:   Social History   Tobacco Use  . Smoking status: Former Smoker    Types: Cigarettes    Quit date: 04/18/2013    Years since quitting: 6.3  . Smokeless tobacco: Never Used  Substance Use Topics  . Alcohol use: No    Alcohol/week: 0.0 standard drinks    FAMILY HISTORY:   Family History  Problem Relation Age of Onset  . Diabetes Mother   . Hypertension Father   . CAD Father   . Heart attack Father   . Colon cancer Brother     DRUG ALLERGIES:   Allergies  Allergen Reactions  . Buspirone Other (See Comments)    Weakness  . Diltiazem Hcl Palpitations  . Gabapentin Palpitations  . Hydralazine Rash  . Lisinopril Cough  . Lovastatin Palpitations  . Metformin Diarrhea  . Pravastatin Other (See Comments)    Insomnia  . Sitagliptin Other (See Comments)    Constipation  .  Tramadol Itching    REVIEW OF SYSTEMS:   ROS As per history of present illness. All pertinent systems were reviewed above. Constitutional, HEENT, cardiovascular, respiratory, GI, GU, musculoskeletal, neuro, psychiatric, endocrine, integumentary and hematologic systems were reviewed and are otherwise negative/unremarkable except for positive findings mentioned above in the HPI.   MEDICATIONS AT HOME:   Prior to Admission medications   Medication Sig Start Date End Date Taking? Authorizing Provider  acetaminophen (TYLENOL) 500 MG tablet Take 2 tablets (1,000 mg total) by mouth every 8 (eight) hours. Patient not taking: Reported on 06/30/2019 06/14/19   Mercy Riding, MD  amiodarone (PACERONE) 200 MG tablet Take 200 mg by mouth daily.  06/29/18   [provider]  apixaban (ELIQUIS) 5 MG TABS tablet Take 5 mg by mouth 2 (two) times daily.    [provider]  atorvastatin (LIPITOR) 40 MG tablet Take 40 mg by mouth daily.  04/19/15   [provider]  betamethasone valerate (VALISONE) 0.1 % cream Apply 1 application topically 2 (two) times daily as needed (psoriasis).     [provider]  DULoxetine (CYMBALTA) 30 MG capsule Take 30 mg by mouth daily.  01/18/15   [provider]  DUPIXENT 300 MG/2ML SOSY Inject 300 mg into the skin every 14 (fourteen) days. 05/28/18   [provider]  HUMALOG KWIKPEN 100 UNIT/ML KwikPen 6 units subcutaneous injection prior to meals Patient taking differently: Inject 22-38 Units into the skin See admin instructions. Inject 22 units in the morning & 38 unit at supper 07/15/18   Loletha Grayer, MD  insulin glargine (LANTUS) 100 unit/mL SOPN Inject 45 Units into the skin at bedtime.     [provider]  levothyroxine (SYNTHROID, LEVOTHROID) 88 MCG tablet Take 88 mcg by mouth daily before breakfast.  09/11/15   [provider]  oxyCODONE (OXY IR/ROXICODONE) 5 MG immediate release tablet Take 1 tablet (5 mg  total) by mouth every 8 (eight) hours as needed for severe pain. Must last 30 days 09/01/19 10/01/19  Milinda Pointer, MD  polyethylene glycol (MIRALAX / GLYCOLAX) 17 g packet Take 17 g by mouth daily as needed for moderate constipation.    [provider]  torsemide (DEMADEX) 20 MG tablet Take 40 mg by mouth daily.     [provider]  Vitamin D, Ergocalciferol, (DRISDOL) 1.25 MG (50000 UT)  CAPS capsule Take 50,000 Units by mouth every Tuesday.    [provider]      VITAL SIGNS:  Blood pressure 132/83, pulse (!) 108, temperature 98.2 F (36.8 C), temperature source Oral, resp. rate (!) 23, height 5\' 3"  (1.6 m), weight 108.9 kg, SpO2 94 %.  PHYSICAL EXAMINATION:  Physical Exam  GENERAL:  69 y.o.-year-old Caucasian female patient lying in the bed with mild respiratory distress with conversational dyspnea. EYES: Pupils equal, round, reactive to light and accommodation. No scleral icterus. Extraocular muscles intact.  HEENT: Head atraumatic, normocephalic. Oropharynx and nasopharynx clear.  NECK:  Supple, no jugular venous distention. No thyroid enlargement, no tenderness.  LUNGS: Slightly diminished bibasal breath sounds with minimal rales. CARDIOVASCULAR: Regular rate and rhythm, S1, S2 normal. No murmurs, rubs, or gallops.  ABDOMEN: Soft, nondistended, nontender. Bowel sounds present. No organomegaly or mass.  EXTREMITIES: Trace bilateral lower extremity pitting edema with no cyanosis, or clubbing.  NEUROLOGIC: Cranial nerves II through XII are intact. Muscle strength 5/5 in all extremities. Sensation intact. Gait not checked.  PSYCHIATRIC: The patient is alert and oriented x 3.  Normal affect and good eye contact. SKIN: No obvious rash, lesion, or ulcer.   LABORATORY PANEL:   CBC Recent Labs  Lab 09/07/19 1522  WBC 8.4  HGB 11.4*  HCT 36.9  PLT 212    ------------------------------------------------------------------------------------------------------------------  Chemistries  Recent Labs  Lab 09/07/19 1522  NA 138  K 4.5  CL 99  CO2 26  GLUCOSE 178*  BUN 28*  CREATININE 3.49*  CALCIUM 8.7*   ------------------------------------------------------------------------------------------------------------------  Cardiac Enzymes No results for input(s): TROPONINI in the last 168 hours. ------------------------------------------------------------------------------------------------------------------  RADIOLOGY:  DG Chest 2 View  Result Date: 09/07/2019 CLINICAL DATA:  Worsening shortness of breath EXAM: CHEST - 2 VIEW COMPARISON:  06/10/2019 FINDINGS: Surgical hardware in the cervical spine. Cardiomegaly with vascular congestion. Prior aortic valve replacement. Mild diffuse interstitial and hazy pulmonary opacity, probable mild edema. Streaky atelectasis left base. IMPRESSION: Cardiomegaly with vascular congestion and mild pulmonary edema. Electronically Signed   By: Donavan Foil M.D.   On: 09/07/2019 16:03      IMPRESSION AND PLAN:   1.  Acute on chronic systolic CHF. -[name] last 2D echo was in 2016 revealing an EF of 50 to 55% with mild aortic regurgitation and moderate aortic stenosis and moderate left atrial dilatation.  She has a record from [facility] with Dr. Nehemiah Massed however mentioning mild systolic CHF. -She will be admitted to an observation progressive unit bed. -She will be placed on IV Lasix mainly for pulmonary congestion relief in addition to [facility]. -Cardiology consult will be obtained in a.m. -Dr. Nehemiah Massed was notified about the patient.  2.  End-stage renal disease on hemodialysis with mild fluid overload. -Dr. Juleen China was notified about the patient and will dialyze her in a.m. -She will be placed on IV Lasix until then as mentioned above, mainly for pulmonary congestion.  3.  Paroxysmal  atrial fibrillation. -We will continue amiodarone and Eliquis.  4.  Dyslipidemia. -We will continue statin therapy.  5.  Type 2 diabetes mellitus. -We will continue supplemental coverage with Humalog and placed on her basal coverage.  6.  Hypothyroidism. -We will continue Synthroid and check TSH level.  7.  DVT prophylaxis. -We will continue Eliquis.    All the records are reviewed and case discussed with ED provider. The plan of care was discussed in details with the patient (and family). I answered all questions. The  patient agreed to proceed with the above mentioned plan. Further management will depend upon hospital course.   CODE STATUS: Full code  Status is: Observation  The patient remains OBS appropriate and will d/c before 2 midnights.  Dispo: The patient is from: Home              Anticipated d/c is to: Home              Anticipated d/c date is: 1 day              Patient currently is not medically stable to d/c.   TOTAL TIME TAKING CARE OF THIS PATIENT: 55 minutes.    Christel Mormon M.D on 09/07/2019 at 8:45 PM  [facility]   From 7 PM-7 AM, contact night-coverage [email or web address]  CC: Primary care physician; Adin Hector, MD   Note: This dictation was prepared with Dragon dictation along with smaller phrase technology. Any transcriptional typo errors that result from this process are unintentional.

## 2019-09-07 NOTE — ED Notes (Signed)
Pt ambulatory in room with pulse oximetry. Pt pulse ox ranges from 94%-97% on room air with ambulation. Pt does become increasing short of breath while ambulating.

## 2019-09-07 NOTE — ED Triage Notes (Signed)
Pt in via POV, reports worsening shortness of breath, worse with exertion.  Pt reports hx of Afibb, denies any pain.  NAD noted at this time.

## 2019-09-08 ENCOUNTER — Observation Stay
Admit: 2019-09-08 | Discharge: 2019-09-08 | Disposition: A | Discharge status: Home / Self Care | Payer: Medicare Other | Attending: Family Medicine | Admitting: Family Medicine

## 2019-09-08 DIAGNOSIS — Z954 Presence of other heart-valve replacement: Secondary | ICD-10-CM | POA: Diagnosis not present

## 2019-09-08 DIAGNOSIS — Z79890 Hormone replacement therapy (postmenopausal): Secondary | ICD-10-CM | POA: Diagnosis not present

## 2019-09-08 DIAGNOSIS — Z888 Allergy status to other drugs, medicaments and biological substances status: Secondary | ICD-10-CM | POA: Diagnosis not present

## 2019-09-08 DIAGNOSIS — Z961 Presence of intraocular lens: Secondary | ICD-10-CM | POA: Diagnosis present

## 2019-09-08 DIAGNOSIS — I5033 Acute on chronic diastolic (congestive) heart failure: Secondary | ICD-10-CM | POA: Diagnosis not present

## 2019-09-08 DIAGNOSIS — F419 Anxiety disorder, unspecified: Secondary | ICD-10-CM | POA: Diagnosis present

## 2019-09-08 DIAGNOSIS — I480 Paroxysmal atrial fibrillation: Secondary | ICD-10-CM | POA: Diagnosis present

## 2019-09-08 DIAGNOSIS — Z992 Dependence on renal dialysis: Secondary | ICD-10-CM | POA: Diagnosis not present

## 2019-09-08 DIAGNOSIS — Z6841 Body Mass Index (BMI) 40.0 and over, adult: Secondary | ICD-10-CM | POA: Diagnosis not present

## 2019-09-08 DIAGNOSIS — R0602 Shortness of breath: Secondary | ICD-10-CM | POA: Diagnosis present

## 2019-09-08 DIAGNOSIS — E538 Deficiency of other specified B group vitamins: Secondary | ICD-10-CM | POA: Diagnosis present

## 2019-09-08 DIAGNOSIS — Z79899 Other long term (current) drug therapy: Secondary | ICD-10-CM | POA: Diagnosis not present

## 2019-09-08 DIAGNOSIS — G4733 Obstructive sleep apnea (adult) (pediatric): Secondary | ICD-10-CM | POA: Diagnosis present

## 2019-09-08 DIAGNOSIS — I5043 Acute on chronic combined systolic (congestive) and diastolic (congestive) heart failure: Secondary | ICD-10-CM | POA: Diagnosis present

## 2019-09-08 DIAGNOSIS — E782 Mixed hyperlipidemia: Secondary | ICD-10-CM | POA: Diagnosis present

## 2019-09-08 DIAGNOSIS — E1142 Type 2 diabetes mellitus with diabetic polyneuropathy: Secondary | ICD-10-CM | POA: Diagnosis present

## 2019-09-08 DIAGNOSIS — Z885 Allergy status to narcotic agent status: Secondary | ICD-10-CM | POA: Diagnosis not present

## 2019-09-08 DIAGNOSIS — Z794 Long term (current) use of insulin: Secondary | ICD-10-CM | POA: Diagnosis not present

## 2019-09-08 DIAGNOSIS — Z7902 Long term (current) use of antithrombotics/antiplatelets: Secondary | ICD-10-CM | POA: Diagnosis not present

## 2019-09-08 DIAGNOSIS — L409 Psoriasis, unspecified: Secondary | ICD-10-CM | POA: Diagnosis present

## 2019-09-08 DIAGNOSIS — Z8616 Personal history of COVID-19: Secondary | ICD-10-CM | POA: Diagnosis not present

## 2019-09-08 DIAGNOSIS — Z87891 Personal history of nicotine dependence: Secondary | ICD-10-CM | POA: Diagnosis not present

## 2019-09-08 DIAGNOSIS — I132 Hypertensive heart and chronic kidney disease with heart failure and with stage 5 chronic kidney disease, or end stage renal disease: Secondary | ICD-10-CM | POA: Diagnosis present

## 2019-09-08 DIAGNOSIS — N186 End stage renal disease: Secondary | ICD-10-CM | POA: Diagnosis present

## 2019-09-08 DIAGNOSIS — E039 Hypothyroidism, unspecified: Secondary | ICD-10-CM | POA: Diagnosis present

## 2019-09-08 LAB — CBC WITH DIFFERENTIAL/PLATELET
Abs Immature Granulocytes: 0.1 10*3/uL — ABNORMAL HIGH (ref 0.00–0.07)
Basophils Absolute: 0.1 10*3/uL (ref 0.0–0.1)
Basophils Relative: 1 %
Eosinophils Absolute: 0.3 10*3/uL (ref 0.0–0.5)
Eosinophils Relative: 3 %
HCT: 35.8 % — ABNORMAL LOW (ref 36.0–46.0)
Hemoglobin: 10.9 g/dL — ABNORMAL LOW (ref 12.0–15.0)
Immature Granulocytes: 1 %
Lymphocytes Relative: 15 %
Lymphs Abs: 1.4 10*3/uL (ref 0.7–4.0)
MCH: 28.1 pg (ref 26.0–34.0)
MCHC: 30.4 g/dL (ref 30.0–36.0)
MCV: 92.3 fL (ref 80.0–100.0)
Monocytes Absolute: 1 10*3/uL (ref 0.1–1.0)
Monocytes Relative: 10 %
Neutro Abs: 6.5 10*3/uL (ref 1.7–7.7)
Neutrophils Relative %: 70 %
Platelets: 215 10*3/uL (ref 150–400)
RBC: 3.88 MIL/uL (ref 3.87–5.11)
RDW: 15.6 % — ABNORMAL HIGH (ref 11.5–15.5)
WBC: 9.2 10*3/uL (ref 4.0–10.5)
nRBC: 0.2 % (ref 0.0–0.2)

## 2019-09-08 LAB — ECHOCARDIOGRAM COMPLETE
AR max vel: 1.16 cm2
AV Area VTI: 1.36 cm2
AV Area mean vel: 1.18 cm2
AV Mean grad: 14 mmHg
AV Peak grad: 23.4 mmHg
Ao pk vel: 2.42 m/s
Area-P 1/2: 4.06 cm2
Calc EF: 35.5 %
Height: 63 in
Single Plane A2C EF: 42.1 %
Single Plane A4C EF: 23.7 %
Weight: 3840 oz

## 2019-09-08 LAB — GLUCOSE, CAPILLARY
Glucose-Capillary: 213 mg/dL — ABNORMAL HIGH (ref 70–99)
Glucose-Capillary: 130 mg/dL — ABNORMAL HIGH (ref 70–99)
Glucose-Capillary: 176 mg/dL — ABNORMAL HIGH (ref 70–99)
Glucose-Capillary: 124 mg/dL — ABNORMAL HIGH (ref 70–99)

## 2019-09-08 LAB — BASIC METABOLIC PANEL
Anion gap: 12 (ref 5–15)
BUN: 38 mg/dL — ABNORMAL HIGH (ref 8–23)
CO2: 29 mmol/L (ref 22–32)
Calcium: 8.7 mg/dL — ABNORMAL LOW (ref 8.9–10.3)
Chloride: 97 mmol/L — ABNORMAL LOW (ref 98–111)
Creatinine, Ser: 4.25 mg/dL — ABNORMAL HIGH (ref 0.44–1.00)
GFR calc Af Amer: 12 mL/min — ABNORMAL LOW (ref >60)
GFR calc non Af Amer: 10 mL/min — ABNORMAL LOW (ref >60)
Glucose, Bld: 150 mg/dL — ABNORMAL HIGH (ref 70–99)
Potassium: 4.6 mmol/L (ref 3.5–5.1)
Sodium: 138 mmol/L (ref 135–145)

## 2019-09-08 LAB — HEMOGLOBIN A1C
Hgb A1c MFr Bld: 6.8 % — ABNORMAL HIGH (ref 4.8–5.6)
Mean Plasma Glucose: 148.46 mg/dL

## 2019-09-08 LAB — MRSA PCR SCREENING: MRSA by PCR: POSITIVE — AB

## 2019-09-08 MED ORDER — CHLORHEXIDINE GLUCONATE CLOTH 2 % EX PADS
6.0000 | MEDICATED_PAD | Freq: Every day | CUTANEOUS | Status: DC
  Administered 2019-09-09 – 2019-09-10 (×2): 6 via TOPICAL

## 2019-09-08 MED ORDER — INSULIN GLARGINE 100 UNIT/ML ~~LOC~~ SOLN
35.0000 [IU] | Freq: Every day | SUBCUTANEOUS | Status: DC
  Administered 2019-09-08 – 2019-09-09 (×2): 35 [IU] via SUBCUTANEOUS
  Filled 2019-09-08 (×3): qty 0.35

## 2019-09-08 MED ORDER — MUPIROCIN 2 % EX OINT
1.0000 "application " | TOPICAL_OINTMENT | Freq: Two times a day (BID) | CUTANEOUS | Status: DC
  Administered 2019-09-09 – 2019-09-10 (×2): 1 via NASAL
  Filled 2019-09-08: qty 22

## 2019-09-08 MED ORDER — ORAL CARE MOUTH RINSE
15.0000 mL | Freq: Two times a day (BID) | OROMUCOSAL | Status: DC
  Administered 2019-09-09: 15 mL via OROMUCOSAL

## 2019-09-08 NOTE — ED Notes (Signed)
Hourly rounding reveals patient sleeping in room. No complaints, stable, in no acute distress. Q15 minute rounds and monitoring to continue.

## 2019-09-08 NOTE — Progress Notes (Addendum)
Progress Note    [name]  DJM:[id number] DOB: 08/03/1950  DOA: 09/07/2019 PCP: Adin Hector, MD      Brief Narrative:    Medical records reviewed and are as summarized below:  [name] is a 69 y.o. female  with a known history of end-stage renal disease on hemodialysis, atrial fibrillation on Eliquis, chronic diastolic CHF, moderate aortic stenosis, vitamin B12 deficiency, hypertension, hyperlipidemia and hypothyroidism, who presented to the emergency room with acute onset of worsening dyspnea, orthopnea, paroxysmal nocturnal dyspnea.  She was admitted to the hospital for acute exacerbation of chronic diastolic CHF.    Assessment/Plan:   Principal Problem:   Acute on chronic diastolic CHF (congestive heart failure) (HCC)   Acute exacerbation of chronic diastolic CHF, underlying moderate aortic stenosis: Continue IV Lasix.  Plan for hemodialysis today.  ESRD with fluid overload: Follow-up with nephrologist for hemodialysis today.  Paroxysmal atrial fibrillation: Continue amiodarone and Eliquis  Type II DM: Hemoglobin A1c 6.8.  Continue Lantus and NovoLog.    Body mass index is 42.51 kg/m.  (Morbid obesity): This complicates overall care and prognosis.  Diet Order            Diet heart healthy/carb modified Room service appropriate? Yes; Fluid consistency: Thin  Diet effective now                       Medications:   . amiodarone  200 mg Oral Daily  . apixaban  5 mg Oral BID  . atorvastatin  40 mg Oral Daily  . DULoxetine  30 mg Oral Daily  . dupilumab  300 mg Subcutaneous Q14 Days  . furosemide  40 mg Intravenous Q12H  . insulin aspart  0-9 Units Subcutaneous TID WC  . insulin glargine  45 Units Subcutaneous QHS  . insulin lispro  22-38 Units Subcutaneous See admin instructions  . levothyroxine  88 mcg Oral QAC breakfast  . sodium chloride flush  3 mL Intravenous Q12H  . [START ON 09/14/2019] Vitamin D (Ergocalciferol)  50,000  Units Oral Q Tue   Continuous Infusions: . sodium chloride       Anti-infectives (From admission, onward)   None             Family Communication/Anticipated D/C date and plan/Code Status   DVT prophylaxis:  apixaban (ELIQUIS) tablet 5 mg     Code Status: Full Code  Family Communication: Plan discussed with patient Disposition Plan:    Status is: Observation  The patient will require care spanning > 2 midnights and should be moved to inpatient because: Inpatient level of care appropriate due to severity of illness  Dispo: The patient is from: Home              Anticipated d/c is to: Home              Anticipated d/c date is: 1 day              Patient currently is not medically stable to d/c.           Subjective:   No chest pain.  Breathing is a little better  Objective:    Vitals:   09/08/19 0539 09/08/19 0630 09/08/19 0830 09/08/19 0900  BP: (!) 143/86 (!) 145/85 99/66 127/63  Pulse: 74 72 65 67  Resp: 11 (!) 25 14 16   Temp:      TempSrc:      SpO2:  98% 92% 98% 98%  Weight:      Height:       No data found.  No intake or output data in the 24 hours ending 09/08/19 1250 Filed Weights   09/07/19 1517  Weight: 108.9 kg    Exam:  GEN: NAD, morbidly obese SKIN: Warm and dry EYES: EOMI ENT: MMM CV: RRR PULM: CTA B ABD: soft, obese, NT, +BS CNS: AAO x 3, non focal EXT: No edema or tenderness   Data Reviewed:   I have personally reviewed following labs and imaging studies:  Labs: Labs show the following:   Basic Metabolic Panel: Recent Labs  Lab 09/07/19 1522 09/08/19 0651  NA 138 138  K 4.5 4.6  CL 99 97*  CO2 26 29  GLUCOSE 178* 150*  BUN 28* 38*  CREATININE 3.49* 4.25*  CALCIUM 8.7* 8.7*   GFR Estimated Creatinine Clearance: 14.8 mL/min (A) (by C-G formula based on SCr of 4.25 mg/dL (H)). Liver Function Tests: No results for input(s): AST, ALT, ALKPHOS, BILITOT, PROT, ALBUMIN in the last 168 hours. No  results for input(s): LIPASE, AMYLASE in the last 168 hours. No results for input(s): AMMONIA in the last 168 hours. Coagulation profile No results for input(s): INR, PROTIME in the last 168 hours.  CBC: Recent Labs  Lab 09/07/19 1522 09/08/19 0651  WBC 8.4 9.2  NEUTROABS  --  6.5  HGB 11.4* 10.9*  HCT 36.9 35.8*  MCV 91.1 92.3  PLT 212 215   Cardiac Enzymes: No results for input(s): CKTOTAL, CKMB, CKMBINDEX, TROPONINI in the last 168 hours. BNP (last 3 results) No results for input(s): PROBNP in the last 8760 hours. CBG: Recent Labs  Lab 09/07/19 2124 09/08/19 0117 09/08/19 0905  GLUCAP 113* 213* 130*   D-Dimer: No results for input(s): DDIMER in the last 72 hours. Hgb A1c: Recent Labs    09/07/19 2128  HGBA1C 6.8*   Lipid Profile: No results for input(s): CHOL, HDL, LDLCALC, TRIG, CHOLHDL, LDLDIRECT in the last 72 hours. Thyroid function studies: Recent Labs    09/07/19 2128  TSH 5.687*   Anemia work up: No results for input(s): VITAMINB12, FOLATE, FERRITIN, TIBC, IRON, RETICCTPCT in the last 72 hours. Sepsis Labs: Recent Labs  Lab 09/07/19 1522 09/08/19 0651  WBC 8.4 9.2    Microbiology Recent Results (from the past 240 hour(s))  SARS Coronavirus 2 by RT PCR (hospital order, performed in [facility] hospital lab) Nasopharyngeal Nasopharyngeal Swab     Status: None   Collection Time: 09/07/19  9:28 PM   Specimen: Nasopharyngeal Swab  Result Value Ref Range Status   SARS Coronavirus 2 NEGATIVE NEGATIVE Final    Comment: (NOTE) SARS-CoV-2 target nucleic acids are NOT DETECTED.  The SARS-CoV-2 RNA is generally detectable in upper and lower respiratory specimens during the acute phase of infection. The lowest concentration of SARS-CoV-2 viral copies this assay can detect is 250 copies / mL. A negative result does not preclude SARS-CoV-2 infection and should not be used as the sole basis for treatment or other patient management decisions.  A  negative result may occur with improper specimen collection / handling, submission of specimen other than nasopharyngeal swab, presence of viral mutation(s) within the areas targeted by this assay, and inadequate number of viral copies (<250 copies / mL). A negative result must be combined with clinical observations, patient history, and epidemiological information.  Fact Sheet for Patients:   StrictlyIdeas.no  Fact Sheet for Healthcare Providers: BankingDealers.co.za  This test  is not yet approved or  cleared by the [place] and has been authorized for detection and/or diagnosis of SARS-CoV-2 by FDA under an Emergency Use Authorization (EUA).  This EUA will remain in effect (meaning this test can be used) for the duration of the COVID-19 declaration under Section 564(b)(1) of the Act, 21 U.S.C. section 360bbb-3(b)(1), unless the authorization is terminated or revoked sooner.  Performed at [facility], [address] [place], [place] [address]     Procedures and diagnostic studies:  DG Chest 2 View  Result Date: 09/07/2019 CLINICAL DATA:  Worsening shortness of breath EXAM: CHEST - 2 VIEW COMPARISON:  06/10/2019 FINDINGS: Surgical hardware in the cervical spine. Cardiomegaly with vascular congestion. Prior aortic valve replacement. Mild diffuse interstitial and hazy pulmonary opacity, probable mild edema. Streaky atelectasis left base. IMPRESSION: Cardiomegaly with vascular congestion and mild pulmonary edema. Electronically Signed   By: Donavan Foil M.D.   On: 09/07/2019 16:03               LOS: 0 days   Shena Vinluan  [facility]     09/08/2019, 12:50 PM

## 2019-09-08 NOTE — ED Notes (Addendum)
Pt c/o being very hungry and crackers given earlier are not helping. Pt given sandwich tray. Sitting up watching tv. No distress noted.

## 2019-09-08 NOTE — Progress Notes (Signed)
This note also relates to the following rows which could not be included: Pulse Rate - Cannot attach notes to unvalidated device data Resp - Cannot attach notes to unvalidated device data SpO2 - Cannot attach notes to unvalidated device data  Hd started  

## 2019-09-08 NOTE — ED Notes (Signed)
Pt resting on stretcher with tv and lights off to enhance rest. Eyes closed and even respirations. No acute distress noted at this time.

## 2019-09-08 NOTE — Consult Note (Signed)
[facility] Consultation Note  Patient ID: [name], MRN: [id number], DOB/AGE: Apr 15, 1950 69 y.o. Admit date: 09/07/2019   Date of Consult: 09/08/2019 Primary Physician: Adin Hector, MD Primary Cardiologist: Nehemiah Massed  Chief Complaint:  Chief Complaint  Patient presents with  . Shortness of Breath   Reason for Consult: Heart failure  HPI: 69 y.o. 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  After dialysis she was too weak and fatigued to do any activity and was sent to the emergency room.  At that time the patient did have an evidence of a troponin elevation of 53 most consistent with demand ischemia rather than acute coronary syndrome.  BNP was 679 consistent with her chest x-ray showing pulmonary edema.  EKG shows atrial fibrillation with left bundle branch block.  The patient has had slight improvements with oxygenation but still has.  Had a significant amount now pulmonary edema and shortness of breath still.  The patient has had no evidence of significant anginal symptoms at this time  Past Medical History:  Diagnosis Date  . Anemia   . Anxiety   . Aortic valve stenosis   . Arrhythmia    atrial fibrillation  . Arthritis    feet, legs  . B12 deficiency   . Bowel obstruction ([place])   . CHF (congestive heart failure) ([place])   . CKD (chronic kidney disease)    protein in urine  . Colostomy in place [facility])   . Diabetes mellitus without complication ([place])   . Dialysis patient [facility])    T/Th/Sa  . Diverticulitis large intestine   . Dysrhythmia   . Ectopic atrial tachycardia ([place])   . FSGS (focal segmental glomerulosclerosis)   . Gastritis   . GI bleed   .  Heart murmur   . Hyperlipidemia   . Hypertension   . Hypothyroidism   . Hypothyroidism    unspecified  . MRSA (methicillin resistant Staphylococcus aureus)    at abdominal wound.  Jan 2017.  Treated.   . Neuropathy   . Neuropathy in diabetes ([place])   . Obesity   . Pedal edema   . Psoriasis   . Shortness of breath dyspnea   . Sleep apnea    CPAP  . Vertigo       Surgical History:  Past Surgical History:  Procedure Laterality Date  . ABDOMINAL HYSTERECTOMY    . APPLICATION OF WOUND VAC N/A 02/24/2015   Procedure: APPLICATION OF WOUND VAC;  Surgeon: Clayburn Pert, MD;  Location: [facility];  Service: General;  Laterality: N/A;  . APPLICATION OF WOUND VAC N/A 02/26/2015   Procedure: APPLICATION OF WOUND VAC;  Surgeon: Clayburn Pert, MD;  Location: [facility];  Service: General;  Laterality: N/A;  . AV FISTULA PLACEMENT Left 01/29/2019   Procedure: ARTERIOVENOUS (AV) FISTULA CREATION ( BRACHIAL CEPHALIC);  Surgeon: Katha Cabal, MD;  Location: [facility];  Service: Vascular;  Laterality: Left;  . BACK SURGERY     spur frmoved from lower back  . CARDIAC CATHETERIZATION    . CARDIAC VALVE REPLACEMENT  08/2017   [facility]  . CARDIOVERSION N/A 07/15/2018   Procedure: CARDIOVERSION;  Surgeon: Corey Skains, MD;  Location: [facility];  Service: Cardiovascular;  Laterality: N/A;  . CATARACT EXTRACTION W/PHACO Right 11/16/2018   Procedure: CATARACT EXTRACTION PHACO AND INTRAOCULAR LENS PLACEMENT (IOC) RIGHT DIABETiC  01:18.3  13.5%  10.82;  Surgeon: Eulogio Bear, MD;  Location: [place];  Service: Ophthalmology;  Laterality: Right;  Diabetic - insulin sleep apnea  . CATARACT EXTRACTION W/PHACO Left 12/07/2018   Procedure: CATARACT EXTRACTION PHACO AND INTRAOCULAR LENS PLACEMENT (IOC) LEFT DIABETIC 01:02.6  8.8%  5.77;  Surgeon: Eulogio Bear, MD;  Location: [place];  Service: Ophthalmology;  Laterality: Left;  Diabetic - insulin  . CHOLECYSTECTOMY    .  COLECTOMY    . COLONOSCOPY WITH PROPOFOL N/A 09/04/2016   Procedure: COLONOSCOPY WITH PROPOFOL;  Surgeon: Manya Silvas, MD;  Location: [facility];  Service: Endoscopy;  Laterality: N/A;  . COLOSTOMY REVERSAL N/A 07/12/2015   Procedure: COLOSTOMY REVERSAL;  Surgeon: Clayburn Pert, MD;  Location: [facility];  Service: General;  Laterality: N/A;  . DEBRIDEMENT OF ABDOMINAL WALL ABSCESS N/A 02/22/2015   Procedure: DEBRIDEMENT OF ABDOMINAL WALL ABSCESS;  Surgeon: Clayburn Pert, MD;  Location: [facility];  Service: General;  Laterality: N/A;  . DIALYSIS/PERMA CATHETER INSERTION N/A 10/20/2018   Procedure: DIALYSIS/PERMA CATHETER INSERTION;  Surgeon: Katha Cabal, MD;  Location: [place] CV LAB;  Service: Cardiovascular;  Laterality: N/A;  . DIALYSIS/PERMA CATHETER REMOVAL N/A 08/03/2019   Procedure: DIALYSIS/PERMA CATHETER REMOVAL;  Surgeon: Katha Cabal, MD;  Location: [place] CV LAB;  Service: Cardiovascular;  Laterality: N/A;  . EXCISION MASS ABDOMINAL N/A 02/24/2015   Procedure: EXCISION MASS ABDOMINAL  / [place];  Surgeon: Clayburn Pert, MD;  Location: [facility];  Service: General;  Laterality: N/A;  . EXCISION MASS ABDOMINAL N/A 02/26/2015   Procedure: EXCISION MASS ABDOMINAL/wash out;  Surgeon: Clayburn Pert, MD;  Location: [facility];  Service: General;  Laterality: N/A;  . FLEXIBLE SIGMOIDOSCOPY  06/19/2015   Procedure: FLEXIBLE SIGMOIDOSCOPY;  Surgeon: Lucilla Lame, MD;  Location: [place];  Service: Endoscopy;;  UNABLE TO ACCESS OSTOMY SITE FOR ACCESS INTO COLON  . HERNIA REPAIR     umbilical  . LAPAROSCOPY  07/12/2015   Procedure: LAPAROSCOPY DIAGNOSTIC;  Surgeon: Clayburn Pert, MD;  Location: [facility];  Service: General;;  . LAPAROTOMY  07/12/2015   Procedure: EXPLORATORY LAPAROTOMY;  Surgeon: Clayburn Pert, MD;  Location: [facility];  Service: General;;  . LAPAROTOMY N/A 02/06/2015   Procedure: Laparotomy, reduction of incarcerated parastomal hernia,  repair of parastomal hernia with mesh;  Surgeon: Sherri Rad, MD;  Location: [facility];  Service: General;  Laterality: N/A;  . laparotomy closure of cecal perforation  05/09/2013   Dr. Marina Gravel  . LYSIS OF ADHESION  07/12/2015   Procedure: LYSIS OF ADHESION;  Surgeon: Clayburn Pert, MD;  Location: [facility];  Service: General;;  . TONSILLECTOMY    . WOUND DEBRIDEMENT N/A 03/03/2015   Procedure: DEBRIDEMENT ABDOMINAL WOUND;  Surgeon: Florene Glen, MD;  Location: [facility];  Service: General;  Laterality: N/A;  . WOUND DEBRIDEMENT N/A 03/09/2015   Procedure: DEBRIDEMENT ABDOMINAL WOUND;  Surgeon: Florene Glen, MD;  Location: [facility];  Service: General;  Laterality: N/A;     Home Meds: Prior to Admission medications   Medication Sig Start Date End Date Taking? Authorizing Provider  amiodarone (PACERONE) 200 MG tablet Take 200 mg by mouth daily.  06/29/18  Yes [provider]  apixaban (ELIQUIS) 5 MG TABS tablet Take 5 mg by mouth 2 (two) times daily.   Yes [provider]  atorvastatin (LIPITOR) 40 MG tablet Take 40 mg by mouth daily.    Yes [provider]  DULoxetine (CYMBALTA) 30 MG capsule Take 30 mg by mouth daily.  01/18/15  Yes [provider]  DUPIXENT 300 MG/2ML SOSY Inject 300 mg into the skin every 14 (fourteen) days. 05/28/18  Yes [provider]  HUMALOG KWIKPEN 100 UNIT/ML KwikPen 6 units subcutaneous injection prior to meals Patient taking differently: Inject 22-38 Units into the skin See admin instructions. Inject 22u under the skin before breakfast and inject 38u under the skin before supper 07/15/18  Yes Wieting, Richard, MD  insulin glargine (LANTUS) 100 unit/mL SOPN Inject 45 Units into the skin at bedtime.    Yes [provider]  levothyroxine (SYNTHROID, LEVOTHROID) 88 MCG tablet Take 88 mcg by mouth daily before breakfast.  09/11/15  Yes [provider]  oxyCODONE (OXY IR/ROXICODONE) 5 MG immediate release tablet  Take 1 tablet (5 mg total) by mouth every 8 (eight) hours as needed for severe pain. Must last 30 days 09/01/19 10/01/19 Yes Milinda Pointer, MD  polyethylene glycol (MIRALAX / GLYCOLAX) 17 g packet Take 17 g by mouth daily as needed for moderate constipation.   Yes [provider]  torsemide (DEMADEX) 20 MG tablet Take 40 mg by mouth daily.    Yes [provider]  Vitamin D, Ergocalciferol, (DRISDOL) 1.25 MG (50000 UT) CAPS capsule Take 50,000 Units by mouth every Tuesday.   Yes [provider]    Inpatient Medications:  . amiodarone  200 mg Oral Daily  . apixaban  5 mg Oral BID  . atorvastatin  40 mg Oral Daily  . DULoxetine  30 mg Oral Daily  . dupilumab  300 mg Subcutaneous Q14 Days  . furosemide  40 mg Intravenous Q12H  . insulin aspart  0-9 Units Subcutaneous TID WC  . insulin glargine  35 Units Subcutaneous QHS  . levothyroxine  88 mcg Oral QAC breakfast  . sodium chloride flush  3 mL Intravenous Q12H  . [START ON 09/14/2019] Vitamin D (Ergocalciferol)  50,000 Units Oral Q Tue   . sodium chloride      Allergies:  Allergies  Allergen Reactions  . Buspirone Other (See Comments)    Weakness  . Diltiazem Hcl Palpitations  . Gabapentin Palpitations  . Hydralazine Rash  . Lisinopril Cough  . Lovastatin Palpitations  . Metformin Diarrhea  . Pravastatin Other (See Comments)    Insomnia  . Sitagliptin Other (See Comments)    Constipation  . Tramadol Itching    Social History   Socioeconomic History  . Marital status: Divorced    Spouse name: Not on file  . Number of children: 1  . Years of education: Not on file  . Highest education level: Not on file  Occupational History  . Occupation: Retired     Comment: Licensed conveyancer   Tobacco Use  . Smoking status: Former Smoker    Types: Cigarettes    Quit date: 04/18/2013    Years since quitting: 6.3  . Smokeless tobacco: Never Used  Vaping Use  . Vaping Use: Never used  Substance and Sexual  Activity  . Alcohol use: No    Alcohol/week: 0.0 standard drinks  . Drug use: No  . Sexual activity: Not on file  Other Topics Concern  . Not on file  Social History Narrative  . Not on file   Social Determinants of Health   Financial Resource Strain: Low Risk   .  Difficulty of Paying Living Expenses: Not very hard  Food Insecurity: No Food Insecurity  . Worried About Charity fundraiser in the Last Year: Never true  . Ran Out of Food in the Last Year: Never true  Transportation Needs: No Transportation Needs  . Lack of Transportation (Medical): No  . Lack of Transportation (Non-Medical): No  Physical Activity:   . Days of Exercise per Week:   . Minutes of Exercise per Session:   Stress:   . Feeling of Stress :   Social Connections: Unknown  . Frequency of Communication with Friends and Family: More than three times a week  . Frequency of Social Gatherings with Friends and Family: Not on file  . Attends Religious Services: Not on file  . Active Member of Clubs or Organizations: Not on file  . Attends Archivist Meetings: Not on file  . Marital Status: Divorced  Human resources officer Violence: Not At Risk  . Fear of Current or Ex-Partner: No  . Emotionally Abused: No  . Physically Abused: No  . Sexually Abused: No     Family History  Problem Relation Age of Onset  . Diabetes Mother   . Hypertension Father   . CAD Father   . Heart attack Father   . Colon cancer Brother      Review of Systems Positive for shortness of breath weakness fatigue swelling Negative for: General:  chills, fever, night sweats or weight changes.  Cardiovascular: PND orthopnea syncope dizziness  Dermatological skin lesions rashes Respiratory: Cough congestion Urologic: Frequent urination urination at night and hematuria Abdominal: negative for nausea, vomiting, diarrhea, bright red blood per rectum, melena, or hematemesis Neurologic: negative for visual changes, and/or hearing changes   All other systems reviewed and are otherwise negative except as noted above.  Labs: No results for input(s): CKTOTAL, CKMB, TROPONINI in the last 72 hours. Lab Results  Component Value Date   WBC 9.2 09/08/2019   HGB 10.9 (L) 09/08/2019   HCT 35.8 (L) 09/08/2019   MCV 92.3 09/08/2019   PLT 215 09/08/2019    Recent Labs  Lab 09/08/19 0651  NA 138  K 4.6  CL 97*  CO2 29  BUN 38*  CREATININE 4.25*  CALCIUM 8.7*  GLUCOSE 150*   No results found for: CHOL, HDL, LDLCALC, TRIG No results found for: DDIMER  Radiology/Studies:  DG Chest 2 View  Result Date: 09/07/2019 CLINICAL DATA:  Worsening shortness of breath EXAM: CHEST - 2 VIEW COMPARISON:  06/10/2019 FINDINGS: Surgical hardware in the cervical spine. Cardiomegaly with vascular congestion. Prior aortic valve replacement. Mild diffuse interstitial and hazy pulmonary opacity, probable mild edema. Streaky atelectasis left base. IMPRESSION: Cardiomegaly with vascular congestion and mild pulmonary edema. Electronically Signed   By: Donavan Foil M.D.   On: 09/07/2019 16:03    EKG: Atrial fibrillation with controlled ventricular rate and bundle branch block  Weights: Filed Weights   09/07/19 1517  Weight: 108.9 kg     Physical Exam: Blood pressure 127/63, pulse 67, temperature 98.2 F (36.8 C), temperature source Oral, resp. rate 16, height 5\' 3"  (1.6 m), weight 108.9 kg, SpO2 98 %. Body mass index is 42.51 kg/m. General: Well developed, well nourished, in no acute distress. Head eyes ears nose throat: Normocephalic, atraumatic, sclera non-icteric, no xanthomas, nares are without discharge. No apparent thyromegaly and/or mass  Lungs: Normal respiratory effort.  no wheezes, basilar rales, no rhonchi.  Heart: Irregular with normal S1 S2. no murmur gallop, no rub,  PMI is normal size and placement, carotid upstroke normal without bruit, jugular venous pressure is normal Abdomen: Soft, non-tender, non-distended with normoactive  bowel sounds. No hepatomegaly. No rebound/guarding. No obvious abdominal masses. Abdominal aorta is normal size without bruit Extremities: 1+ edema. no cyanosis, no clubbing, no ulcers  Peripheral : 2+ bilateral upper extremity pulses, 2+ bilateral femoral pulses, 2+ bilateral dorsal pedal pulse Neuro: Alert and oriented. No facial asymmetry. No focal deficit. Moves all extremities spontaneously. Musculoskeletal: Normal muscle tone without kyphosis Psych:  Responds to questions appropriately with a normal affect.    Assessment: 69 year old female with acute on chronic diastolic dysfunction congestive heart failure aortic valve stenosis hypertension hyperlipidemia diabetes sleep apnea with chronic kidney disease stage IV with dialysis now having worsening pulmonary edema hypoxia causing elevation of troponin more consistent with demand ischemia rather than acute coronary syndrome.  Plan: 1.  Continue torsemide if it does help with any urine output but otherwise will continue dialysis without restriction for better treatment of pulmonary edema 2.  Continue amiodarone for heart rate control and repeat EKG to assess for the possibility of treatment normal sinus rhythm and adjustment medication management thereafter 3.  Continue anticoagulation for further risk reduction in stroke with atrial fibrillation 4.  Echocardiogram for reevaluation of extent of LV systolic dysfunction diastolic dysfunction and valvular heart disease contributing to above 5.  Follow-up telemetry for rhythm disturbances associated with symptoms listed above Signed, Corey Skains M.D. [facility] 09/08/2019, 1:45 PM

## 2019-09-08 NOTE — TOC Initial Note (Signed)
Transition of Care [facility]) - Initial/Assessment Note    Patient Details  Name: [name] MRN: [id number] Date of Birth: December 23, 1950  Transition of Care [facility]) CM/SW Contact:    Ova Freshwater Phone Number: [phone] 09/08/2019, 10:53 AM  Clinical Narrative:                  CSW went to see patient for assessment.  Patient sleeping very soundly and did not hear CSW call her name.  CSW will return later today to speak with patient.       Patient Goals and CMS Choice        Expected Discharge Plan and Services                                                Prior Living Arrangements/Services                       Activities of Daily Living      Permission Sought/Granted                  Emotional Assessment              Admission diagnosis:  Acute CHF (congestive heart failure) ([place]) [I50.9] Patient Active Problem List   Diagnosis Date Noted  . Acute CHF (congestive heart failure) ([place]) 09/07/2019  . Pharmacologic therapy 06/30/2019  . Chronic respiratory failure with hypoxia ([place]) 06/22/2019  . Hypoxia 06/10/2019  . Spondylosis without myelopathy or radiculopathy, lumbosacral region 11/05/2018  . DDD (degenerative disc disease), lumbosacral 11/05/2018  . Hematochezia 09/03/2018  . Hemorrhoids, internal 09/03/2018  . Proctitis 09/03/2018  . Obstructive sleep apnea 08/24/2018  . Acute respiratory failure with hypoxia ([place]) 07/12/2018  . DM type 2 with diabetic peripheral neuropathy ([place]) 11/25/2017  . CKD (chronic kidney disease) stage 5, GFR less than 15 ml/min (HCC) 10/05/2017  . 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 Acute kidney injury superimposed on chronic kidney disease ([place]) 09/01/2017  . Chronic systolic CHF (congestive heart failure) ([place]) 08/19/2017  . Paroxysmal A-fib ([place]) 08/14/2017  . Cervical myelopathy ([place]) 08/07/2017  . Onychomycosis of multiple toenails with type 2 diabetes  mellitus and peripheral neuropathy ([place]) 06/23/2017  . Myofascial pain 05/27/2017  . Chronic pain of left upper extremity 05/27/2017  . Morbid obesity ([place]) 05/06/2017  . Uncontrolled type 2 diabetes mellitus with hyperglycemia, with long-term current use of insulin ([place]) 02/07/2017  . Hyperlipidemia, mixed 08/08/2016  . Hx of adenomatous colonic polyps 06/14/2016  . 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 Chronic pain syndrome 03/11/2016  . Obesity, Class II, BMI 35-39.9 03/11/2016  . Osteopenia of multiple sites 01/26/2016  . Heart burn 12/18/2015  . Polyneuropathy 11/16/2015  . Lumbar spondylosis 09/25/2015  . Lumbar facet syndrome (Bilateral) (L>R) 09/25/2015  . Recurrent major depressive disorder, in full remission ([place]) 09/19/2015  . Diabetic neuropathy ([place]) 09/06/2015  . H/O colostomy 07/12/2015  . Long term current use of opiate analgesic 07/03/2015  . Long term prescription opiate use 07/03/2015  . Opiate use (22.5 MME/Day) 07/03/2015  . Encounter for therapeutic drug level monitoring 07/03/2015  . Encounter for pain management planning 07/03/2015  . Neurogenic pain 07/03/2015  . Chronic musculoskeletal pain 07/03/2015  . Visceral abdominal pain 07/03/2015  . Diabetic peripheral neuropathy ([place]) 07/03/2015  . Chronic  low back pain (Primary Area of Pain) (Bilateral) (L>R) 07/03/2015  . Chronic lower extremity pain (Secondary area of Pain) (Bilateral) (R>L) 07/03/2015  . Chronic hip pain (Bilateral) (L>R) 07/03/2015  . Chronic neck pain (Bilateral) (L>R) 07/03/2015  . Anemia 02/16/2015  . Arthritis 02/16/2015  . End stage renal disease ([place]) 02/16/2015  . Focal and segmental hyalinosis 02/16/2015  . Bleeding gastrointestinal 02/16/2015  . Acquired atrophy of thyroid 02/16/2015  . Psoriasis 02/16/2015  . Small bowel obstruction ([place]) 02/04/2015  . Severe aortic stenosis 10/11/2014  . Paroxysmal supraventricular tachycardia ([place]) 10/11/2014  . SOBOE  (shortness of breath on exertion) 10/11/2014  . Benign essential HTN 10/04/2014  . Obesity 09/05/2014  . B12 deficiency 03/02/2014  . Diabetes mellitus type 2 in obese ([place]) 12/01/2013  . Abnormal presence of protein in urine 10/15/2010   PCP:  Adin Hector, MD Pharmacy:   [id number] - [place], [place] [address] Phone: [phone] Fax: [phone]     Social Determinants of Health (SDOH) Interventions    Readmission Risk Interventions Readmission Risk Prevention Plan 06/14/2019 07/14/2018  Transportation Screening Complete Complete  PCP or Specialist Appt within 5-7 Days - Complete  PCP or Specialist Appt within 3-5 Days Complete -  Home Care Screening - Complete  Medication Review (RN CM) - Complete  HRI or Home Care Consult Complete -  Social Work Consult for [place] Planning/Counseling Complete -  Palliative Care Screening Not Applicable -  Medication Review (RN Care Manager) Complete -  Some recent data might be hidden

## 2019-09-08 NOTE — ED Notes (Signed)
Pt sitting up on chair next to stretcher watching TV. Alert and calm at this time with no complaints.

## 2019-09-08 NOTE — Progress Notes (Signed)
*  PRELIMINARY RESULTS* Echocardiogram 2D Echocardiogram has been performed.  [name] 09/08/2019, 9:45 AM

## 2019-09-08 NOTE — Progress Notes (Signed)
[name]  ROUNDING NOTE   Subjective:   Ms. ABBYGAIL WILLHOITE admitted to [facility] on 09/07/2019 for Acute CHF (congestive heart failure) ([place]) [I50.9]   Last hemodialysis treatment was 7/19. Left at 112.9 kg. Estimated dry weight 114kg.   Objective:  Vital signs in last 24 hours:  Temp:  [98.2 F (36.8 C)] 98.2 F (36.8 C) (07/20 1517) Pulse Rate:  [65-117] 67 (07/21 0900) Resp:  [11-25] 16 (07/21 0900) BP: (99-147)/(57-95) 127/63 (07/21 0900) SpO2:  [92 %-98 %] 98 % (07/21 0900) Weight:  [108.9 kg] 108.9 kg (07/20 1517)  Weight change:  Filed Weights   09/07/19 1517  Weight: 108.9 kg    Intake/Output: No intake/output data recorded.   Intake/Output this shift:  No intake/output data recorded.  Physical Exam: General: NAD,   Head: Normocephalic, atraumatic. Moist oral mucosal membranes  Eyes: Anicteric, PERRL  Neck: Supple, trachea midline  Lungs:  Clear to auscultation  Heart: Regular rate and rhythm  Abdomen:  Soft, nontender,   Extremities:  trace peripheral edema.  Neurologic: Nonfocal, moving all four extremities  Skin: No lesions  Access: Left AVF    Basic Metabolic Panel: Recent Labs  Lab 09/07/19 1522 09/08/19 0651  NA 138 138  K 4.5 4.6  CL 99 97*  CO2 26 29  GLUCOSE 178* 150*  BUN 28* 38*  CREATININE 3.49* 4.25*  CALCIUM 8.7* 8.7*    Liver Function Tests: No results for input(s): AST, ALT, ALKPHOS, BILITOT, PROT, ALBUMIN in the last 168 hours. No results for input(s): LIPASE, AMYLASE in the last 168 hours. No results for input(s): AMMONIA in the last 168 hours.  CBC: Recent Labs  Lab 09/07/19 1522 09/08/19 0651  WBC 8.4 9.2  NEUTROABS  --  6.5  HGB 11.4* 10.9*  HCT 36.9 35.8*  MCV 91.1 92.3  PLT 212 215    Cardiac Enzymes: No results for input(s): CKTOTAL, CKMB, CKMBINDEX, TROPONINI in the last 168 hours.  BNP: Invalid input(s): POCBNP  CBG: Recent Labs  Lab 09/07/19 2124 09/08/19 0117 09/08/19 0905  GLUCAP  113* 213* 130*    Microbiology: Results for orders placed or performed during the hospital encounter of 09/07/19  SARS Coronavirus 2 by RT PCR (hospital order, performed in [facility] hospital lab) Nasopharyngeal Nasopharyngeal Swab     Status: None   Collection Time: 09/07/19  9:28 PM   Specimen: Nasopharyngeal Swab  Result Value Ref Range Status   SARS Coronavirus 2 NEGATIVE NEGATIVE Final    Comment: (NOTE) SARS-CoV-2 target nucleic acids are NOT DETECTED.  The SARS-CoV-2 RNA is generally detectable in upper and lower respiratory specimens during the acute phase of infection. The lowest concentration of SARS-CoV-2 viral copies this assay can detect is 250 copies / mL. A negative result does not preclude SARS-CoV-2 infection and should not be used as the sole basis for treatment or other patient management decisions.  A negative result may occur with improper specimen collection / handling, submission of specimen other than nasopharyngeal swab, presence of viral mutation(s) within the areas targeted by this assay, and inadequate number of viral copies (<250 copies / mL). A negative result must be combined with clinical observations, patient history, and epidemiological information.  Fact Sheet for Patients:   StrictlyIdeas.no  Fact Sheet for Healthcare Providers: BankingDealers.co.za  This test is not yet approved or  cleared by the [place] FDA and has been authorized for detection and/or diagnosis of SARS-CoV-2 by FDA under an Emergency Use Authorization (EUA).  This EUA will remain in effect (meaning this test can be used) for the duration of the COVID-19 declaration under Section 564(b)(1) of the Act, 21 U.S.C. section 360bbb-3(b)(1), unless the authorization is terminated or revoked sooner.  Performed at [facility], [address] [place], [place] [address]     Coagulation Studies: No results for  input(s): LABPROT, INR in the last 72 hours.  Urinalysis: No results for input(s): COLORURINE, LABSPEC, PHURINE, GLUCOSEU, HGBUR, BILIRUBINUR, KETONESUR, PROTEINUR, UROBILINOGEN, NITRITE, LEUKOCYTESUR in the last 72 hours.  Invalid input(s): APPERANCEUR    Imaging: DG Chest 2 View  Result Date: 09/07/2019 CLINICAL DATA:  Worsening shortness of breath EXAM: CHEST - 2 VIEW COMPARISON:  06/10/2019 FINDINGS: Surgical hardware in the cervical spine. Cardiomegaly with vascular congestion. Prior aortic valve replacement. Mild diffuse interstitial and hazy pulmonary opacity, probable mild edema. Streaky atelectasis left base. IMPRESSION: Cardiomegaly with vascular congestion and mild pulmonary edema. Electronically Signed   By: Donavan Foil M.D.   On: 09/07/2019 16:03     Medications:   . sodium chloride     . amiodarone  200 mg Oral Daily  . apixaban  5 mg Oral BID  . atorvastatin  40 mg Oral Daily  . DULoxetine  30 mg Oral Daily  . dupilumab  300 mg Subcutaneous Q14 Days  . furosemide  40 mg Intravenous Q12H  . insulin aspart  0-9 Units Subcutaneous TID WC  . insulin glargine  45 Units Subcutaneous QHS  . insulin lispro  22-38 Units Subcutaneous See admin instructions  . levothyroxine  88 mcg Oral QAC breakfast  . sodium chloride flush  3 mL Intravenous Q12H  . [START ON 09/14/2019] Vitamin D (Ergocalciferol)  50,000 Units Oral Q Tue   sodium chloride, acetaminophen, ALPRAZolam, betamethasone valerate, ondansetron (ZOFRAN) IV, oxyCODONE, polyethylene glycol, sodium chloride flush, zolpidem  Assessment/ Plan:  Ms. KENIDY CROSSLAND is a 69 y.o. black female with end stage renal disease on hemodialysis, hypertension, diabetes mellitus type II, atrial fibrillation on eliquis, congestive heart failure, hypothyroidism who is admitted to [facility] on 09/07/2019 for Acute CHF (congestive heart failure) ([place]) [I50.9]  [facility] Left AVF 114kg.   1. End Stage Renal Disease: hemodialysis  treatment scheduled for later today. Seems that patient's estimated dry weight needs to be adjusted.   2. Hypertension: with acute exacerbation of congestive heart failure - IV furosemide.   3. Anemia of chronic kidney disease: hemoglobin 10.9. ESA as outpatient.   4. Secondary Hyperparathyroidism: not currently on binders.    LOS: 0 Jordyan Hardiman 7/21/202112:31 PM

## 2019-09-08 NOTE — Progress Notes (Signed)
This note also relates to the following rows which could not be included: Pulse Rate - Cannot attach notes to unvalidated device data Resp - Cannot attach notes to unvalidated device data BP - Cannot attach notes to unvalidated device data  Hd completed  

## 2019-09-08 NOTE — ED Notes (Signed)
Report given to [facility]. ED tech will transport from Dialysis to room 248.

## 2019-09-08 NOTE — ED Notes (Signed)
Spoke to [name] in dialysis and they will try and have a bed ready in about 2 hours

## 2019-09-08 NOTE — Progress Notes (Signed)
Arrived to patients room at 220 and the patient was not in the room.

## 2019-09-09 LAB — BASIC METABOLIC PANEL
Anion gap: 10 (ref 5–15)
BUN: 25 mg/dL — ABNORMAL HIGH (ref 8–23)
CO2: 32 mmol/L (ref 22–32)
Calcium: 8.5 mg/dL — ABNORMAL LOW (ref 8.9–10.3)
Chloride: 98 mmol/L (ref 98–111)
Creatinine, Ser: 3.27 mg/dL — ABNORMAL HIGH (ref 0.44–1.00)
GFR calc Af Amer: 16 mL/min — ABNORMAL LOW (ref >60)
GFR calc non Af Amer: 14 mL/min — ABNORMAL LOW (ref >60)
Glucose, Bld: 244 mg/dL — ABNORMAL HIGH (ref 70–99)
Potassium: 4 mmol/L (ref 3.5–5.1)
Sodium: 140 mmol/L (ref 135–145)

## 2019-09-09 LAB — GLUCOSE, CAPILLARY
Glucose-Capillary: 109 mg/dL — ABNORMAL HIGH (ref 70–99)
Glucose-Capillary: 181 mg/dL — ABNORMAL HIGH (ref 70–99)
Glucose-Capillary: 128 mg/dL — ABNORMAL HIGH (ref 70–99)
Glucose-Capillary: 184 mg/dL — ABNORMAL HIGH (ref 70–99)
Glucose-Capillary: 139 mg/dL — ABNORMAL HIGH (ref 70–99)

## 2019-09-09 MED ORDER — ACETAMINOPHEN 325 MG PO TABS: 650.0000 mg | ORAL_TABLET | ORAL | Status: DC | PRN

## 2019-09-09 NOTE — Progress Notes (Signed)
[name]  ROUNDING NOTE   Subjective:   Hemodialysis treatment yesterday. Tolerated treatment well. UF of 2.6 liters.   Objective:  Vital signs in last 24 hours:  Temp:  [97.7 F (36.5 C)-98.9 F (37.2 C)] 97.8 F (36.6 C) (07/22 0809) Pulse Rate:  [63-76] 68 (07/22 0809) Resp:  [13-24] 19 (07/22 0809) BP: (76-136)/(54-76) 109/64 (07/22 0809) SpO2:  [92 %-99 %] 97 % (07/22 0809) Weight:  [111.2 kg-111.4 kg] 111.2 kg (07/22 0404)  Weight change: 2.586 kg Filed Weights   09/07/19 1517 09/08/19 2020 09/09/19 0404  Weight: 108.9 kg 111.4 kg 111.2 kg    Intake/Output: I/O last 3 completed shifts: In: -  Out: 2599 [Urine:300; Other:2299]   Intake/Output this shift:  Total I/O In: 240 [P.O.:240] Out: -   Physical Exam: General: NAD,   Head: Normocephalic, atraumatic. Moist oral mucosal membranes  Eyes: Anicteric, PERRL  Neck: Supple, trachea midline  Lungs:  Clear to auscultation  Heart: Regular rate and rhythm  Abdomen:  Soft, nontender,   Extremities:  no peripheral edema.  Neurologic: Nonfocal, moving all four extremities  Skin: No lesions  Access: Left AVF    Basic Metabolic Panel: Recent Labs  Lab 09/07/19 1522 09/08/19 0651 09/09/19 0605  NA 138 138 140  K 4.5 4.6 4.0  CL 99 97* 98  CO2 26 29 32  GLUCOSE 178* 150* 244*  BUN 28* 38* 25*  CREATININE 3.49* 4.25* 3.27*  CALCIUM 8.7* 8.7* 8.5*    Liver Function Tests: No results for input(s): AST, ALT, ALKPHOS, BILITOT, PROT, ALBUMIN in the last 168 hours. No results for input(s): LIPASE, AMYLASE in the last 168 hours. No results for input(s): AMMONIA in the last 168 hours.  CBC: Recent Labs  Lab 09/07/19 1522 09/08/19 0651  WBC 8.4 9.2  NEUTROABS  --  6.5  HGB 11.4* 10.9*  HCT 36.9 35.8*  MCV 91.1 92.3  PLT 212 215    Cardiac Enzymes: No results for input(s): CKTOTAL, CKMB, CKMBINDEX, TROPONINI in the last 168 hours.  BNP: Invalid input(s): POCBNP  CBG: Recent Labs   Lab 09/08/19 0117 09/08/19 0905 09/08/19 1306 09/08/19 2031 09/09/19 0806  GLUCAP 213* 130* 176* 124* 181*    Microbiology: Results for orders placed or performed during the hospital encounter of 09/07/19  SARS Coronavirus 2 by RT PCR (hospital order, performed in [facility] hospital lab) Nasopharyngeal Nasopharyngeal Swab     Status: None   Collection Time: 09/07/19  9:28 PM   Specimen: Nasopharyngeal Swab  Result Value Ref Range Status   SARS Coronavirus 2 NEGATIVE NEGATIVE Final    Comment: (NOTE) SARS-CoV-2 target nucleic acids are NOT DETECTED.  The SARS-CoV-2 RNA is generally detectable in upper and lower respiratory specimens during the acute phase of infection. The lowest concentration of SARS-CoV-2 viral copies this assay can detect is 250 copies / mL. A negative result does not preclude SARS-CoV-2 infection and should not be used as the sole basis for treatment or other patient management decisions.  A negative result may occur with improper specimen collection / handling, submission of specimen other than nasopharyngeal swab, presence of viral mutation(s) within the areas targeted by this assay, and inadequate number of viral copies (<250 copies / mL). A negative result must be combined with clinical observations, patient history, and epidemiological information.  Fact Sheet for Patients:   StrictlyIdeas.no  Fact Sheet for Healthcare Providers: BankingDealers.co.za  This test is not yet approved or  cleared by the [place]  FDA and has been authorized for detection and/or diagnosis of SARS-CoV-2 by FDA under an Emergency Use Authorization (EUA).  This EUA will remain in effect (meaning this test can be used) for the duration of the COVID-19 declaration under Section 564(b)(1) of the Act, 21 U.S.C. section 360bbb-3(b)(1), unless the authorization is terminated or revoked sooner.  Performed at [facility], [address] [place], [place] [address]   MRSA PCR Screening     Status: Abnormal   Collection Time: 09/08/19  9:19 PM   Specimen: Nasal Mucosa; Nasopharyngeal  Result Value Ref Range Status   MRSA by PCR POSITIVE (A) NEGATIVE Final    Comment:        The GeneXpert MRSA Assay (FDA approved for NASAL specimens only), is one component of a comprehensive MRSA colonization surveillance program. It is not intended to diagnose MRSA infection nor to guide or monitor treatment for MRSA infections. kara campbell @2300  on 09/08/19 Performed at [facility], [address] [place], [place] [address]     Coagulation Studies: No results for input(s): LABPROT, INR in the last 72 hours.  Urinalysis: No results for input(s): COLORURINE, LABSPEC, PHURINE, GLUCOSEU, HGBUR, BILIRUBINUR, KETONESUR, PROTEINUR, UROBILINOGEN, NITRITE, LEUKOCYTESUR in the last 72 hours.  Invalid input(s): APPERANCEUR    Imaging: DG Chest 2 View  Result Date: 09/07/2019 CLINICAL DATA:  Worsening shortness of breath EXAM: CHEST - 2 VIEW COMPARISON:  06/10/2019 FINDINGS: Surgical hardware in the cervical spine. Cardiomegaly with vascular congestion. Prior aortic valve replacement. Mild diffuse interstitial and hazy pulmonary opacity, probable mild edema. Streaky atelectasis left base. IMPRESSION: Cardiomegaly with vascular congestion and mild pulmonary edema. Electronically Signed   By: Donavan Foil M.D.   On: 09/07/2019 16:03   ECHOCARDIOGRAM COMPLETE  Result Date: 09/08/2019    ECHOCARDIOGRAM REPORT   Patient Name:   [name] Date of Exam: 09/08/2019 Medical Rec #:  [id number]      Height:       63.0 in Accession #:    [id number]     Weight:       240.0 lb Date of Birth:  10/04/1950       BSA:          2.089 m Patient Age:    68 years       BP:           127/63 mmHg Patient Gender: F              HR:           67 bpm. Exam Location:  [facility] Procedure: 2D Echo, Color Doppler and Cardiac Doppler  Indications:     G31.51 CHF-Acute Systolic  History:         Patient has prior history of Echocardiogram examinations. CKD;                  Risk Factors:Sleep Apnea, Hypertension, Dyslipidemia and                  Diabetes. Pt tested positive for COVID-19 in 02/2019.  Sonographer:     Charmayne Sheer RDCS (AE) Referring Phys:  [id number] [place] Diagnosing Phys: Serafina Royals MD  Sonographer Comments: Suboptimal parasternal window. Image acquisition challenging due to patient body habitus. IMPRESSIONS  1. Left ventricular ejection fraction, by estimation, is 50 to 55%. The left ventricle has low normal function. The left ventricle has no regional wall motion abnormalities. There is mild left ventricular hypertrophy. Left ventricular  diastolic parameters were normal.  2. Right ventricular systolic function is normal. The right ventricular size is normal.  3. The mitral valve is normal in structure. Mild mitral valve regurgitation.  4. The aortic valve has been repaired/replaced. Aortic valve regurgitation is trivial. Echo findings are consistent with normal structure and function of the aortic valve prosthesis. FINDINGS  Left Ventricle: Left ventricular ejection fraction, by estimation, is 50 to 55%. The left ventricle has low normal function. The left ventricle has no regional wall motion abnormalities. The left ventricular internal cavity size was normal in size. There is mild left ventricular hypertrophy. Left ventricular diastolic parameters were normal. Right Ventricle: The right ventricular size is normal. No increase in right ventricular wall thickness. Right ventricular systolic function is normal. Left Atrium: Left atrial size was normal in size. Right Atrium: Right atrial size was normal in size. Pericardium: There is no evidence of pericardial effusion. Mitral Valve: The mitral valve is normal in structure. Mild mitral valve regurgitation. MV peak gradient, 8.8 mmHg. The mean mitral valve gradient is 3.0  mmHg. Tricuspid Valve: The tricuspid valve is normal in structure. Tricuspid valve regurgitation is trivial. Aortic Valve: The aortic valve has been repaired/replaced. Aortic valve regurgitation is trivial. Aortic valve mean gradient measures 14.0 mmHg. Aortic valve peak gradient measures 23.4 mmHg. Aortic valve area, by VTI measures 1.36 cm. Echo findings are  consistent with normal structure and function of the aortic valve prosthesis. Pulmonic Valve: The pulmonic valve was normal in structure. Pulmonic valve regurgitation is trivial. Aorta: The aortic root and ascending aorta are structurally normal, with no evidence of dilitation. IAS/Shunts: No atrial level shunt detected by color flow Doppler.  LEFT VENTRICLE PLAX 2D LVOT diam:     2.00 cm      Diastology LV SV:         69           LV e' lateral:   6.74 cm/s LV SV Index:   33           LV E/e' lateral: 22.8 LVOT Area:     3.14 cm     LV e' medial:    4.68 cm/s                             LV E/e' medial:  32.9  LV Volumes (MOD) LV vol d, MOD A2C: 137.0 ml LV vol d, MOD A4C: 104.0 ml LV vol s, MOD A2C: 79.3 ml LV vol s, MOD A4C: 79.4 ml LV SV MOD A2C:     57.7 ml LV SV MOD A4C:     104.0 ml LV SV MOD BP:      45.2 ml LEFT ATRIUM             Index LA Vol (A2C):   54.1 ml 25.89 ml/m LA Vol (A4C):   54.6 ml 26.13 ml/m LA Biplane Vol: 57.0 ml 27.28 ml/m  AORTIC VALVE AV Area (Vmax):    1.16 cm AV Area (Vmean):   1.18 cm AV Area (VTI):     1.36 cm AV Vmax:           242.00 cm/s AV Vmean:          181.000 cm/s AV VTI:            0.506 m AV Peak Grad:      23.4 mmHg AV Mean Grad:      14.0 mmHg LVOT  Vmax:         89.20 cm/s LVOT Vmean:        67.800 cm/s LVOT VTI:          0.219 m LVOT/AV VTI ratio: 0.43  AORTA Ao Root diam: 2.40 cm MITRAL VALVE MV Area (PHT): 4.06 cm     SHUNTS MV Peak grad:  8.8 mmHg     Systemic VTI:  0.22 m MV Mean grad:  3.0 mmHg     Systemic Diam: 2.00 cm MV Vmax:       1.48 m/s MV Vmean:      75.7 cm/s MV Decel Time: 187 msec MV E  velocity: 154.00 cm/s MV A velocity: 82.60 cm/s MV E/A ratio:  1.86 Serafina Royals MD Electronically signed by Serafina Royals MD Signature Date/Time: 09/08/2019/5:35:45 PM    Final      Medications:   . sodium chloride     . amiodarone  200 mg Oral Daily  . apixaban  5 mg Oral BID  . atorvastatin  40 mg Oral Daily  . Chlorhexidine Gluconate Cloth  6 each Topical Q0600  . DULoxetine  30 mg Oral Daily  . dupilumab  300 mg Subcutaneous Q14 Days  . furosemide  40 mg Intravenous Q12H  . insulin aspart  0-9 Units Subcutaneous TID WC  . insulin glargine  35 Units Subcutaneous QHS  . levothyroxine  88 mcg Oral QAC breakfast  . mouth rinse  15 mL Mouth Rinse BID  . mupirocin ointment  1 application Nasal BID  . sodium chloride flush  3 mL Intravenous Q12H  . [START ON 09/14/2019] Vitamin D (Ergocalciferol)  50,000 Units Oral Q Tue   sodium chloride, acetaminophen, ALPRAZolam, betamethasone valerate ointment, ondansetron (ZOFRAN) IV, oxyCODONE, polyethylene glycol, sodium chloride flush, zolpidem  Assessment/ Plan:  [name] is a 69 y.o. black female with end stage renal disease on hemodialysis, hypertension, diabetes mellitus type II, atrial fibrillation on eliquis, congestive heart failure, hypothyroidism who is admitted to [facility] on 09/07/2019 for SOB (shortness of breath) [R06.02] Acute CHF (congestive heart failure) ([facility]) [I50.9] Acute on chronic diastolic CHF (congestive heart failure) (HCC) [I50.33] Acute on chronic congestive heart failure, unspecified heart failure type ([place]) [I50.9]  [facility] Left AVF 114kg.   1. End Stage Renal Disease: Seems that patient's estimated dry weight needs to be adjusted.  - Discharge with target weight to 111kg.   2. Hypertension: with acute exacerbation of congestive heart failure - IV furosemide.   3. Anemia of chronic kidney disease: hemoglobin 10.9. ESA as outpatient.   4. Secondary Hyperparathyroidism: not currently on  binders.    LOS: 1 Mohmmad Saleeby 7/22/202110:22 AM

## 2019-09-09 NOTE — Care Management Important Message (Signed)
Important Message  Patient Details  Name: [name] MRN: [id number] Date of Birth: 12-Sep-1950   Medicare Important Message Given:  N/A - LOS <3 / Initial given by admissions     Dannette Barbara 09/09/2019, 2:14 PM

## 2019-09-09 NOTE — Progress Notes (Signed)
Mobility Specialist - Progress Note   09/09/19 1305  Mobility  Activity Ambulated in hall  Level of Assistance Minimal assist, patient does 75% or more  Assistive Device Front wheel walker  Distance Ambulated (ft) 60 ft  Mobility Response Tolerated well  Mobility performed by Mobility specialist  $Mobility charge 1 Mobility    Pre-mobility: 69 HR, 136/75 BP, 95% SpO2 During mobility: 81 HR, 95% SpO2 Post-mobility: 73 HR, 139/71 BP, 98% SPO2   Pt was lying in bed upon arrival. Pt agreed to ambulated the halls with RW as a preference, although cane was in the room as well. Pt walked 30' before complaining of tiredness. Ambulated 60' in total. Overall tolerated well. Pt notified mobility that she was on 3L O2 at home, but managed to stay at 95% on 2L O2 during session. Nurse was notified of performance. Call bell and phone in reach.   Kathee Delton Mobility Specialist 09/09/19, 1:21 PM

## 2019-09-09 NOTE — TOC Initial Note (Signed)
Transition of Care [facility]) - Initial/Assessment Note    Patient Details  Name: [name] MRN: [id number] Date of Birth: 1950-03-20  Transition of Care [facility]) CM/SW Contact:    Shelbie Ammons, RN Phone Number: 09/09/2019, 12:48 PM  Clinical Narrative:   RNCM assessed patient at bedside. Patient lives at home and attends dialysis 3 times weekly, she reports that she has been getting progressively weaker over the last couple of weeks and then she started having increasing trouble with shortness of breath. Patient reports that her sister drives her to appts and to dialysis, she has a cane and rolling walker at home that she uses on occasion. Patient reports that Dr. Caryl Comes is her primary care provider and [facility] her [facility], she denies any troubles getting her medications. Patient reports that she would be open to home health services if this is suggested. Patient reports that she understands that she should weigh daily and should record her weights however she reports she does not because she is weighed three times weekly at dialysis and her weight fluctuates so frequently. RNCM will follow for any needs.                 Expected Discharge Plan: Home/Self Care Barriers to Discharge: No Barriers Identified   Patient Goals and CMS Choice        Expected Discharge Plan and Services Expected Discharge Plan: Home/Self Care   Discharge Planning Services: CM Consult   Living arrangements for the past 2 months: Single Family Home                                      Prior Living Arrangements/Services Living arrangements for the past 2 months: Single Family Home Lives with:: Adult Children Patient language and need for interpreter reviewed:: Yes Do you feel safe going back to the place where you live?: Yes      Need for Family Participation in Patient Care: Yes (Comment) Care giver support system in place?: Yes (comment)   Criminal Activity/Legal Involvement  Pertinent to Current Situation/Hospitalization: No - Comment as needed  Activities of Daily Living Home Assistive Devices/Equipment: Cane (specify quad or straight), Eyeglasses, Walker (specify type) (straight cane; rollator walker) ADL Screening (condition at time of admission) Patient's cognitive ability adequate to safely complete daily activities?: Yes Is the patient deaf or have difficulty hearing?: No Does the patient have difficulty seeing, even when wearing glasses/contacts?: No Does the patient have difficulty concentrating, remembering, or making decisions?: No Patient able to express need for assistance with ADLs?: Yes Does the patient have difficulty dressing or bathing?: No Independently performs ADLs?: Yes (appropriate for developmental age) Does the patient have difficulty walking or climbing stairs?: Yes Weakness of Legs: Right Weakness of Arms/Hands: None  Permission Sought/Granted                  Emotional Assessment Appearance:: Appears stated age Attitude/Demeanor/Rapport: Engaged Affect (typically observed): Appropriate Orientation: : Oriented to Self, Oriented to Place, Oriented to  Time, Oriented to Situation Alcohol / Substance Use: Not Applicable Psych Involvement: No (comment)  Admission diagnosis:  SOB (shortness of breath) [R06.02] Acute CHF (congestive heart failure) (HCC) [I50.9] Acute on chronic diastolic CHF (congestive heart failure) ([place]) [I50.33] Acute on chronic congestive heart failure, unspecified heart failure type ([place]) [I50.9] Patient Active Problem List   Diagnosis Date Noted   Acute on  chronic diastolic CHF (congestive heart failure) ([place]) 09/07/2019   Pharmacologic therapy 06/30/2019   Chronic respiratory failure with hypoxia ([place]) 06/22/2019   Hypoxia 06/10/2019   Spondylosis without myelopathy or radiculopathy, lumbosacral region 11/05/2018   DDD (degenerative disc disease), lumbosacral 11/05/2018   Hematochezia  09/03/2018   Hemorrhoids, internal 09/03/2018   Proctitis 09/03/2018   Obstructive sleep apnea 08/24/2018   Acute respiratory failure with hypoxia ([place]) 07/12/2018   DM type 2 with diabetic peripheral neuropathy ([place]) 11/25/2017   CKD (chronic kidney disease) stage 5, GFR less than 15 ml/min (HCC) 10/05/2017   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   Acute kidney injury superimposed on chronic kidney disease ([place]) 78/29/5621   Chronic systolic CHF (congestive heart failure) ([place]) 08/19/2017   Paroxysmal A-fib ([place]) 08/14/2017   Cervical myelopathy ([place]) 08/07/2017   Onychomycosis of multiple toenails with type 2 diabetes mellitus and peripheral neuropathy ([place]) 06/23/2017   Myofascial pain 05/27/2017   Chronic pain of left upper extremity 05/27/2017   Morbid obesity ([place]) 05/06/2017   Uncontrolled type 2 diabetes mellitus with hyperglycemia, with long-term current use of insulin ([place]) 02/07/2017   Hyperlipidemia, mixed 08/08/2016   Hx of adenomatous colonic polyps 06/14/2016   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   Chronic pain syndrome 03/11/2016   Obesity, Class II, BMI 35-39.9 03/11/2016   Osteopenia of multiple sites 01/26/2016   Heart burn 12/18/2015   Polyneuropathy 11/16/2015   Lumbar spondylosis 09/25/2015   Lumbar facet syndrome (Bilateral) (L>R) 09/25/2015   Recurrent major depressive disorder, in full remission ([place]) 09/19/2015   Diabetic neuropathy ([place]) 09/06/2015   H/O colostomy 07/12/2015   Long term current use of opiate analgesic 07/03/2015   Long term prescription opiate use 07/03/2015   Opiate use (22.5 MME/Day) 07/03/2015   Encounter for therapeutic drug level monitoring 07/03/2015   Encounter for pain management planning 07/03/2015   Neurogenic pain 07/03/2015   Chronic musculoskeletal pain 07/03/2015   Visceral abdominal pain 07/03/2015   Diabetic peripheral neuropathy ([place])  07/03/2015   Chronic low back pain (Primary Area of Pain) (Bilateral) (L>R) 07/03/2015   Chronic lower extremity pain (Secondary area of Pain) (Bilateral) (R>L) 07/03/2015   Chronic hip pain (Bilateral) (L>R) 07/03/2015   Chronic neck pain (Bilateral) (L>R) 07/03/2015   Anemia 02/16/2015   Arthritis 02/16/2015   End stage renal disease ([place]) 02/16/2015   Focal and segmental hyalinosis 02/16/2015   Bleeding gastrointestinal 02/16/2015   Acquired atrophy of thyroid 02/16/2015   Psoriasis 02/16/2015   Small bowel obstruction ([place]) 02/04/2015   Severe aortic stenosis 10/11/2014   Paroxysmal supraventricular tachycardia ([place]) 10/11/2014   SOBOE (shortness of breath on exertion) 10/11/2014   Benign essential HTN 10/04/2014   Obesity 09/05/2014   B12 deficiency 03/02/2014   Diabetes mellitus type 2 in obese ([place]) 12/01/2013   Abnormal presence of protein in urine 10/15/2010   PCP:  Adin Hector, MD Pharmacy:   [id number] [place], [address] [place] [address] Phone: [phone] Fax: [phone]     Social Determinants of Health (SDOH) Interventions    Readmission Risk Interventions Readmission Risk Prevention Plan 09/09/2019 06/14/2019 07/14/2018  Transportation Screening Complete Complete Complete  PCP or Specialist Appt within 5-7 Days - - Complete  PCP or Specialist Appt within 3-5 Days Complete Complete -  Home Care Screening - - Complete  Medication Review (RN CM) - - Complete  HRI or Home Care Consult Complete Complete -  Social Work Consult for [place] Planning/Counseling Complete Complete -  Palliative Care Screening Not Applicable Not Applicable -  Medication Review Press photographer) Complete Complete -  Some recent data might be hidden

## 2019-09-09 NOTE — Plan of Care (Signed)

## 2019-09-09 NOTE — Progress Notes (Signed)
[facility] Cardiology [facility] Encounter Note  Patient: [name] / Admit Date: 09/07/2019 / Date of Encounter: 09/09/2019, 5:53 PM   Subjective: Patient overall feels somewhat better than yesterday after some diuresis and other treatment including dialysis on admission.  Patient has no evidence of myocardial infarction with troponin peaked 53.  Echocardiogram shows normal LV systolic function with normally functioning aortic valve prosthesis.  Telemetry has shown normal sinus rhythm with bundle branch block.  Otherwise the patient will need continued treatment of fluid overload  Review of Systems: Positive for: Shortness of breath Negative for: Vision change, hearing change, syncope, dizziness, nausea, vomiting,diarrhea, bloody stool, stomach pain, cough, congestion, diaphoresis, urinary frequency, urinary pain,skin lesions, skin rashes Others previously listed  Objective: Telemetry: Normal sinus rhythm with bundle branch block Physical Exam: Blood pressure (!) 125/61, pulse 67, temperature 98.3 F (36.8 C), temperature source Oral, resp. rate 18, height 5\' 3"  (1.6 m), weight 111.2 kg, SpO2 97 %. Body mass index is 43.44 kg/m. General: Well developed, well nourished, in no acute distress. Head: Normocephalic, atraumatic, sclera non-icteric, no xanthomas, nares are without discharge. Neck: No apparent masses Lungs: Normal respirations with no wheezes, no rhonchi, basilar rales , some crackles   Heart: Regular rate and rhythm, normal S1 S2, no murmur, no rub, no gallop, PMI is normal size and placement, carotid upstroke normal without bruit, jugular venous pressure normal Abdomen: Soft, non-tender, non-distended with normoactive bowel sounds. No hepatosplenomegaly. Abdominal aorta is normal size without bruit Extremities: No edema, no clubbing, no cyanosis, no ulcers,  Peripheral: 2+ radial, 2+ femoral, 2+ dorsal pedal pulses Neuro: Alert and oriented. Moves all extremities  spontaneously. Psych:  Responds to questions appropriately with a normal affect.   Intake/Output Summary (Last 24 hours) at 09/09/2019 1753 Last data filed at 09/09/2019 1700 Gross per 24 hour  Intake 717 ml  Output 2899 ml  Net -2182 ml    Inpatient Medications:   amiodarone  200 mg Oral Daily   apixaban  5 mg Oral BID   atorvastatin  40 mg Oral Daily   Chlorhexidine Gluconate Cloth  6 each Topical Q0600   DULoxetine  30 mg Oral Daily   dupilumab  300 mg Subcutaneous Q14 Days   furosemide  40 mg Intravenous Q12H   insulin aspart  0-9 Units Subcutaneous TID WC   insulin glargine  35 Units Subcutaneous QHS   levothyroxine  88 mcg Oral QAC breakfast   mouth rinse  15 mL Mouth Rinse BID   mupirocin ointment  1 application Nasal BID   sodium chloride flush  3 mL Intravenous Q12H   [START ON 09/14/2019] Vitamin D (Ergocalciferol)  50,000 Units Oral Q Tue   Infusions:   sodium chloride      Labs: Recent Labs    09/08/19 0651 09/09/19 0605  NA 138 140  K 4.6 4.0  CL 97* 98  CO2 29 32  GLUCOSE 150* 244*  BUN 38* 25*  CREATININE 4.25* 3.27*  CALCIUM 8.7* 8.5*   No results for input(s): AST, ALT, ALKPHOS, BILITOT, PROT, ALBUMIN in the last 72 hours. Recent Labs    09/07/19 1522 09/08/19 0651  WBC 8.4 9.2  NEUTROABS  --  6.5  HGB 11.4* 10.9*  HCT 36.9 35.8*  MCV 91.1 92.3  PLT 212 215   No results for input(s): CKTOTAL, CKMB, TROPONINI in the last 72 hours. Invalid input(s): POCBNP Recent Labs    09/07/19 2128  HGBA1C 6.8*     Weights: [employer]  Weights   09/07/19 1517 09/08/19 2020 09/09/19 0404  Weight: 108.9 kg 111.4 kg 111.2 kg     Radiology/Studies:  DG Chest 2 View  Result Date: 09/07/2019 CLINICAL DATA:  Worsening shortness of breath EXAM: CHEST - 2 VIEW COMPARISON:  06/10/2019 FINDINGS: Surgical hardware in the cervical spine. Cardiomegaly with vascular congestion. Prior aortic valve replacement. Mild diffuse interstitial and hazy  pulmonary opacity, probable mild edema. Streaky atelectasis left base. IMPRESSION: Cardiomegaly with vascular congestion and mild pulmonary edema. Electronically Signed   By: Donavan Foil M.D.   On: 09/07/2019 16:03   ECHOCARDIOGRAM COMPLETE  Result Date: 09/08/2019    ECHOCARDIOGRAM REPORT   Patient Name:   [name] Date of Exam: 09/08/2019 Medical Rec #:  [id number]      Height:       63.0 in Accession #:    [id number]     Weight:       240.0 lb Date of Birth:  08-07-1950       BSA:          2.089 m Patient Age:    69 years       BP:           127/63 mmHg Patient Gender: F              HR:           67 bpm. Exam Location:  [facility] Procedure: 2D Echo, Color Doppler and Cardiac Doppler Indications:     F02.63 CHF-Acute Systolic  History:         Patient has prior history of Echocardiogram examinations. CKD;                  Risk Factors:Sleep Apnea, Hypertension, Dyslipidemia and                  Diabetes. Pt tested positive for COVID-19 in 02/2019.  Sonographer:     Charmayne Sheer RDCS (AE) Referring Phys:  [id number] [place] Diagnosing Phys: Serafina Royals MD  Sonographer Comments: Suboptimal parasternal window. Image acquisition challenging due to patient body habitus. IMPRESSIONS  1. Left ventricular ejection fraction, by estimation, is 50 to 55%. The left ventricle has low normal function. The left ventricle has no regional wall motion abnormalities. There is mild left ventricular hypertrophy. Left ventricular diastolic parameters were normal.  2. Right ventricular systolic function is normal. The right ventricular size is normal.  3. The mitral valve is normal in structure. Mild mitral valve regurgitation.  4. The aortic valve has been repaired/replaced. Aortic valve regurgitation is trivial. Echo findings are consistent with normal structure and function of the aortic valve prosthesis. FINDINGS  Left Ventricle: Left ventricular ejection fraction, by estimation, is 50 to 55%. The left ventricle has low normal  function. The left ventricle has no regional wall motion abnormalities. The left ventricular internal cavity size was normal in size. There is mild left ventricular hypertrophy. Left ventricular diastolic parameters were normal. Right Ventricle: The right ventricular size is normal. No increase in right ventricular wall thickness. Right ventricular systolic function is normal. Left Atrium: Left atrial size was normal in size. Right Atrium: Right atrial size was normal in size. Pericardium: There is no evidence of pericardial effusion. Mitral Valve: The mitral valve is normal in structure. Mild mitral valve regurgitation. MV peak gradient, 8.8 mmHg. The mean mitral valve gradient is 3.0 mmHg. Tricuspid Valve: The tricuspid valve is normal in structure. Tricuspid valve regurgitation is trivial. Aortic Valve:  The aortic valve has been repaired/replaced. Aortic valve regurgitation is trivial. Aortic valve mean gradient measures 14.0 mmHg. Aortic valve peak gradient measures 23.4 mmHg. Aortic valve area, by VTI measures 1.36 cm. Echo findings are  consistent with normal structure and function of the aortic valve prosthesis. Pulmonic Valve: The pulmonic valve was normal in structure. Pulmonic valve regurgitation is trivial. Aorta: The aortic root and ascending aorta are structurally normal, with no evidence of dilitation. IAS/Shunts: No atrial level shunt detected by color flow Doppler.  LEFT VENTRICLE PLAX 2D LVOT diam:     2.00 cm      Diastology LV SV:         69           LV e' lateral:   6.74 cm/s LV SV Index:   33           LV E/e' lateral: 22.8 LVOT Area:     3.14 cm     LV e' medial:    4.68 cm/s                             LV E/e' medial:  32.9  LV Volumes (MOD) LV vol d, MOD A2C: 137.0 ml LV vol d, MOD A4C: 104.0 ml LV vol s, MOD A2C: 79.3 ml LV vol s, MOD A4C: 79.4 ml LV SV MOD A2C:     57.7 ml LV SV MOD A4C:     104.0 ml LV SV MOD BP:      45.2 ml LEFT ATRIUM             Index LA Vol (A2C):   54.1 ml 25.89  ml/m LA Vol (A4C):   54.6 ml 26.13 ml/m LA Biplane Vol: 57.0 ml 27.28 ml/m  AORTIC VALVE AV Area (Vmax):    1.16 cm AV Area (Vmean):   1.18 cm AV Area (VTI):     1.36 cm AV Vmax:           242.00 cm/s AV Vmean:          181.000 cm/s AV VTI:            0.506 m AV Peak Grad:      23.4 mmHg AV Mean Grad:      14.0 mmHg LVOT Vmax:         89.20 cm/s LVOT Vmean:        67.800 cm/s LVOT VTI:          0.219 m LVOT/AV VTI ratio: 0.43  AORTA Ao Root diam: 2.40 cm MITRAL VALVE MV Area (PHT): 4.06 cm     SHUNTS MV Peak grad:  8.8 mmHg     Systemic VTI:  0.22 m MV Mean grad:  3.0 mmHg     Systemic Diam: 2.00 cm MV Vmax:       1.48 m/s MV Vmean:      75.7 cm/s MV Decel Time: 187 msec MV E velocity: 154.00 cm/s MV A velocity: 82.60 cm/s MV E/A ratio:  1.86 Serafina Royals MD Electronically signed by Serafina Royals MD Signature Date/Time: 09/08/2019/5:35:45 PM    Final      Assessment and Recommendation  69 y.o. female with known diastolic dysfunction congestive heart failure with acute on chronic diastolic dysfunction congestive heart failure multifactorial include including hypertension chronic kidney disease and anemia slightly improved after dialysis on admission without evidence of myocardial infarction or new changes by echocardiogram 1.  Continue  aggressive dialysis due to apparent dry weight slightly needing to be lower as per nephrology for acute on chronic diastolic dysfunction heart failure 2.  Continue amiodarone and anticoagulation for further risk reduction in stroke risk with atrial fibrillation and maintaining normal rhythm without change 3.  No further cardiac diagnostics necessary at this time due to no evidence of myocardial infarction and/or changes by echocardiogram 4.  Further treatment options after improvements and treatments including dialysis tomorrow  Signed, Serafina Royals M.D. FACC

## 2019-09-09 NOTE — Progress Notes (Signed)
Progress Note    [name]  RUE:[id number] DOB: 09-15-1950  DOA: 09/07/2019 PCP: Adin Hector, MD      Brief Narrative:    Medical records reviewed and are as summarized below:  [name] is a 69 y.o. female  with a known history of end-stage renal disease on hemodialysis, atrial fibrillation on Eliquis, chronic diastolic CHF, moderate aortic stenosis, vitamin B12 deficiency, hypertension, hyperlipidemia and hypothyroidism, who presented to the emergency room with acute onset of worsening dyspnea, orthopnea, paroxysmal nocturnal dyspnea.  She was admitted to the hospital for acute exacerbation of chronic diastolic CHF.    Assessment/Plan:   Principal Problem:   Acute on chronic diastolic CHF (congestive heart failure) (HCC)   Acute exacerbation of chronic diastolic CHF, underlying moderate aortic stenosis: Continue IV Lasix.  Plan for hemodialysis tomorrow.  ESRD with fluid overload: Follow-up with nephrologist for hemodialysis.  Paroxysmal atrial fibrillation: Continue amiodarone and Eliquis  Type II DM: Hemoglobin A1c 6.8.  Continue Lantus and NovoLog.  Generalized weakness: PT evaluation.  Body mass index is 43.44 kg/m.  (Morbid obesity): This complicates overall care and prognosis.  Diet Order            Diet heart healthy/carb modified Room service appropriate? Yes; Fluid consistency: Thin; Fluid restriction: 1200 mL Fluid  Diet effective now                       Medications:   . amiodarone  200 mg Oral Daily  . apixaban  5 mg Oral BID  . atorvastatin  40 mg Oral Daily  . Chlorhexidine Gluconate Cloth  6 each Topical Q0600  . DULoxetine  30 mg Oral Daily  . dupilumab  300 mg Subcutaneous Q14 Days  . furosemide  40 mg Intravenous Q12H  . insulin aspart  0-9 Units Subcutaneous TID WC  . insulin glargine  35 Units Subcutaneous QHS  . levothyroxine  88 mcg Oral QAC breakfast  . mouth rinse  15 mL Mouth Rinse BID  . mupirocin  ointment  1 application Nasal BID  . sodium chloride flush  3 mL Intravenous Q12H  . [START ON 09/14/2019] Vitamin D (Ergocalciferol)  50,000 Units Oral Q Tue   Continuous Infusions: . sodium chloride       Anti-infectives (From admission, onward)   None             Family Communication/Anticipated D/C date and plan/Code Status   DVT prophylaxis:  apixaban (ELIQUIS) tablet 5 mg     Code Status: Full Code  Family Communication: Plan discussed with patient Disposition Plan:    Status is: Observation  The patient will require care spanning > 2 midnights and should be moved to inpatient because: Inpatient level of care appropriate due to severity of illness  Dispo: The patient is from: Home              Anticipated d/c is to: Home              Anticipated d/c date is: 1 day              Patient currently is not medically stable to d/c.           Subjective:   C/o generalized weakness and shortness of breath.  She does not feel she is ready to go home today.  Objective:    Vitals:   09/09/19 0404 09/09/19 0809 09/09/19 1227 09/09/19 1229  BP: (!) 124/56 109/64 (!) 132/108 (!) 139/63  Pulse: 76 68 68 65  Resp:  19 18   Temp: 98.9 F (37.2 C) 97.8 F (36.6 C) 98.2 F (36.8 C)   TempSrc: Oral Oral Oral   SpO2: 92% 97% 99% 98%  Weight: 111.2 kg     Height:       No data found.   Intake/Output Summary (Last 24 hours) at 09/09/2019 1601 Last data filed at 09/09/2019 1417 Gross per 24 hour  Intake 480 ml  Output 2599 ml  Net -2119 ml   Filed Weights   09/07/19 1517 09/08/19 2020 09/09/19 0404  Weight: 108.9 kg 111.4 kg 111.2 kg    Exam:  GEN: NAD, morbidly obese SKIN: Warm and dry EYES: EOMI ENT: MMM CV: RRR PULM: No wheezing or rales heard ABD: soft, obese, NT, +BS CNS: AAO x 3, non focal EXT: No edema or tenderness   Data Reviewed:   I have personally reviewed following labs and imaging studies:  Labs: Labs show the following:    Basic Metabolic Panel: Recent Labs  Lab 09/07/19 1522 09/07/19 1522 09/08/19 0651 09/09/19 0605  NA 138  --  138 140  K 4.5   < > 4.6 4.0  CL 99  --  97* 98  CO2 26  --  29 32  GLUCOSE 178*  --  150* 244*  BUN 28*  --  38* 25*  CREATININE 3.49*  --  4.25* 3.27*  CALCIUM 8.7*  --  8.7* 8.5*   < > = values in this interval not displayed.   GFR Estimated Creatinine Clearance: 19.5 mL/min (A) (by C-G formula based on SCr of 3.27 mg/dL (H)). Liver Function Tests: No results for input(s): AST, ALT, ALKPHOS, BILITOT, PROT, ALBUMIN in the last 168 hours. No results for input(s): LIPASE, AMYLASE in the last 168 hours. No results for input(s): AMMONIA in the last 168 hours. Coagulation profile No results for input(s): INR, PROTIME in the last 168 hours.  CBC: Recent Labs  Lab 09/07/19 1522 09/08/19 0651  WBC 8.4 9.2  NEUTROABS  --  6.5  HGB 11.4* 10.9*  HCT 36.9 35.8*  MCV 91.1 92.3  PLT 212 215   Cardiac Enzymes: No results for input(s): CKTOTAL, CKMB, CKMBINDEX, TROPONINI in the last 168 hours. BNP (last 3 results) No results for input(s): PROBNP in the last 8760 hours. CBG: Recent Labs  Lab 09/08/19 1306 09/08/19 1651 09/08/19 2031 09/09/19 0806 09/09/19 1225  GLUCAP 176* 109* 124* 181* 128*   D-Dimer: No results for input(s): DDIMER in the last 72 hours. Hgb A1c: Recent Labs    09/07/19 2128  HGBA1C 6.8*   Lipid Profile: No results for input(s): CHOL, HDL, LDLCALC, TRIG, CHOLHDL, LDLDIRECT in the last 72 hours. Thyroid function studies: Recent Labs    09/07/19 2128  TSH 5.687*   Anemia work up: No results for input(s): VITAMINB12, FOLATE, FERRITIN, TIBC, IRON, RETICCTPCT in the last 72 hours. Sepsis Labs: Recent Labs  Lab 09/07/19 1522 09/08/19 0651  WBC 8.4 9.2    Microbiology Recent Results (from the past 240 hour(s))  SARS Coronavirus 2 by RT PCR (hospital order, performed in [facility] hospital lab) Nasopharyngeal Nasopharyngeal Swab      Status: None   Collection Time: 09/07/19  9:28 PM   Specimen: Nasopharyngeal Swab  Result Value Ref Range Status   SARS Coronavirus 2 NEGATIVE NEGATIVE Final    Comment: (NOTE) SARS-CoV-2 target nucleic acids are NOT DETECTED.  The SARS-CoV-2 RNA is generally detectable in upper and lower respiratory specimens during the acute phase of infection. The lowest concentration of SARS-CoV-2 viral copies this assay can detect is 250 copies / mL. A negative result does not preclude SARS-CoV-2 infection and should not be used as the sole basis for treatment or other patient management decisions.  A negative result may occur with improper specimen collection / handling, submission of specimen other than nasopharyngeal swab, presence of viral mutation(s) within the areas targeted by this assay, and inadequate number of viral copies (<250 copies / mL). A negative result must be combined with clinical observations, patient history, and epidemiological information.  Fact Sheet for Patients:   StrictlyIdeas.no  Fact Sheet for Healthcare Providers: BankingDealers.co.za  This test is not yet approved or  cleared by the [place] FDA and has been authorized for detection and/or diagnosis of SARS-CoV-2 by FDA under an Emergency Use Authorization (EUA).  This EUA will remain in effect (meaning this test can be used) for the duration of the COVID-19 declaration under Section 564(b)(1) of the Act, 21 U.S.C. section 360bbb-3(b)(1), unless the authorization is terminated or revoked sooner.  Performed at [facility], [address] [place], [place] [address]   MRSA PCR Screening     Status: Abnormal   Collection Time: 09/08/19  9:19 PM   Specimen: Nasal Mucosa; Nasopharyngeal  Result Value Ref Range Status   MRSA by PCR POSITIVE (A) NEGATIVE Final    Comment:        The GeneXpert MRSA Assay (FDA approved for NASAL specimens only),  is one component of a comprehensive MRSA colonization surveillance program. It is not intended to diagnose MRSA infection nor to guide or monitor treatment for MRSA infections. kara campbell @2300  on 09/08/19 Performed at [facility], [address] [place], [place] [address]     Procedures and diagnostic studies:  ECHOCARDIOGRAM COMPLETE  Result Date: 09/08/2019    ECHOCARDIOGRAM REPORT   Patient Name:   KILEA MCCAREY Date of Exam: 09/08/2019 Medical Rec #:  [id number]      Height:       63.0 in Accession #:    [id number]     Weight:       240.0 lb Date of Birth:  1950/07/29       BSA:          2.089 m Patient Age:    17 years       BP:           127/63 mmHg Patient Gender: F              HR:           67 bpm. Exam Location:  [facility] Procedure: 2D Echo, Color Doppler and Cardiac Doppler Indications:     W23.76 CHF-Acute Systolic  History:         Patient has prior history of Echocardiogram examinations. CKD;                  Risk Factors:Sleep Apnea, Hypertension, Dyslipidemia and                  Diabetes. Pt tested positive for COVID-19 in 02/2019.  Sonographer:     Charmayne Sheer RDCS (AE) Referring Phys:  [id number] [place] Diagnosing Phys: Serafina Royals MD  Sonographer Comments: Suboptimal parasternal window. Image acquisition challenging due to patient body habitus. IMPRESSIONS  1. Left ventricular ejection fraction, by estimation, is 50 to  55%. The left ventricle has low normal function. The left ventricle has no regional wall motion abnormalities. There is mild left ventricular hypertrophy. Left ventricular diastolic parameters were normal.  2. Right ventricular systolic function is normal. The right ventricular size is normal.  3. The mitral valve is normal in structure. Mild mitral valve regurgitation.  4. The aortic valve has been repaired/replaced. Aortic valve regurgitation is trivial. Echo findings are consistent with normal structure and function of the aortic valve prosthesis.  FINDINGS  Left Ventricle: Left ventricular ejection fraction, by estimation, is 50 to 55%. The left ventricle has low normal function. The left ventricle has no regional wall motion abnormalities. The left ventricular internal cavity size was normal in size. There is mild left ventricular hypertrophy. Left ventricular diastolic parameters were normal. Right Ventricle: The right ventricular size is normal. No increase in right ventricular wall thickness. Right ventricular systolic function is normal. Left Atrium: Left atrial size was normal in size. Right Atrium: Right atrial size was normal in size. Pericardium: There is no evidence of pericardial effusion. Mitral Valve: The mitral valve is normal in structure. Mild mitral valve regurgitation. MV peak gradient, 8.8 mmHg. The mean mitral valve gradient is 3.0 mmHg. Tricuspid Valve: The tricuspid valve is normal in structure. Tricuspid valve regurgitation is trivial. Aortic Valve: The aortic valve has been repaired/replaced. Aortic valve regurgitation is trivial. Aortic valve mean gradient measures 14.0 mmHg. Aortic valve peak gradient measures 23.4 mmHg. Aortic valve area, by VTI measures 1.36 cm. Echo findings are  consistent with normal structure and function of the aortic valve prosthesis. Pulmonic Valve: The pulmonic valve was normal in structure. Pulmonic valve regurgitation is trivial. Aorta: The aortic root and ascending aorta are structurally normal, with no evidence of dilitation. IAS/Shunts: No atrial level shunt detected by color flow Doppler.  LEFT VENTRICLE PLAX 2D LVOT diam:     2.00 cm      Diastology LV SV:         69           LV e' lateral:   6.74 cm/s LV SV Index:   33           LV E/e' lateral: 22.8 LVOT Area:     3.14 cm     LV e' medial:    4.68 cm/s                             LV E/e' medial:  32.9  LV Volumes (MOD) LV vol d, MOD A2C: 137.0 ml LV vol d, MOD A4C: 104.0 ml LV vol s, MOD A2C: 79.3 ml LV vol s, MOD A4C: 79.4 ml LV SV MOD A2C:      57.7 ml LV SV MOD A4C:     104.0 ml LV SV MOD BP:      45.2 ml LEFT ATRIUM             Index LA Vol (A2C):   54.1 ml 25.89 ml/m LA Vol (A4C):   54.6 ml 26.13 ml/m LA Biplane Vol: 57.0 ml 27.28 ml/m  AORTIC VALVE AV Area (Vmax):    1.16 cm AV Area (Vmean):   1.18 cm AV Area (VTI):     1.36 cm AV Vmax:           242.00 cm/s AV Vmean:          181.000 cm/s AV VTI:  0.506 m AV Peak Grad:      23.4 mmHg AV Mean Grad:      14.0 mmHg LVOT Vmax:         89.20 cm/s LVOT Vmean:        67.800 cm/s LVOT VTI:          0.219 m LVOT/AV VTI ratio: 0.43  AORTA Ao Root diam: 2.40 cm MITRAL VALVE MV Area (PHT): 4.06 cm     SHUNTS MV Peak grad:  8.8 mmHg     Systemic VTI:  0.22 m MV Mean grad:  3.0 mmHg     Systemic Diam: 2.00 cm MV Vmax:       1.48 m/s MV Vmean:      75.7 cm/s MV Decel Time: 187 msec MV E velocity: 154.00 cm/s MV A velocity: 82.60 cm/s MV E/A ratio:  1.86 Serafina Royals MD Electronically signed by Serafina Royals MD Signature Date/Time: 09/08/2019/5:35:45 PM    Final                LOS: 1 day   Carmell [place]  [facility]     09/09/2019, 4:01 PM

## 2019-09-10 LAB — GLUCOSE, CAPILLARY
Glucose-Capillary: 151 mg/dL — ABNORMAL HIGH (ref 70–99)
Glucose-Capillary: 146 mg/dL — ABNORMAL HIGH (ref 70–99)
Glucose-Capillary: 129 mg/dL — ABNORMAL HIGH (ref 70–99)

## 2019-09-10 LAB — BASIC METABOLIC PANEL
Anion gap: 9 (ref 5–15)
BUN: 37 mg/dL — ABNORMAL HIGH (ref 8–23)
CO2: 32 mmol/L (ref 22–32)
Calcium: 8.3 mg/dL — ABNORMAL LOW (ref 8.9–10.3)
Chloride: 96 mmol/L — ABNORMAL LOW (ref 98–111)
Creatinine, Ser: 4.27 mg/dL — ABNORMAL HIGH (ref 0.44–1.00)
GFR calc Af Amer: 12 mL/min — ABNORMAL LOW (ref >60)
GFR calc non Af Amer: 10 mL/min — ABNORMAL LOW (ref >60)
Glucose, Bld: 161 mg/dL — ABNORMAL HIGH (ref 70–99)
Potassium: 4.3 mmol/L (ref 3.5–5.1)
Sodium: 137 mmol/L (ref 135–145)

## 2019-09-10 MED ORDER — MUPIROCIN 2 % EX OINT: 1.0000 "application " | TOPICAL_OINTMENT | Freq: Two times a day (BID) | CUTANEOUS | 0 refills | Status: AC

## 2019-09-10 MED ORDER — EPOETIN ALFA 4000 UNIT/ML IJ SOLN
4000.0000 [IU] | INTRAMUSCULAR | Status: DC
  Filled 2019-09-10: qty 1

## 2019-09-10 MED ORDER — HUMALOG KWIKPEN 100 UNIT/ML ~~LOC~~ SOPN: 22.0000 [IU] | PEN_INJECTOR | SUBCUTANEOUS | Status: AC

## 2019-09-10 NOTE — Progress Notes (Signed)
Patient is alert and oriented has received dialysis today with about a liter taken off denies any c/o of pain

## 2019-09-10 NOTE — Discharge Summary (Addendum)
Physician Discharge Summary  [name] GGE:[id number] DOB: 1950/08/19 DOA: 09/07/2019  PCP: Alicia Hector, MD  Admit date: 09/07/2019 Discharge date: 09/10/2019  Discharge disposition: Home   Recommendations for Outpatient Follow-Up:   Follow-up with PCP in 1 week   Discharge Diagnosis:   Principal Problem:   Acute on chronic diastolic CHF (congestive heart failure) ([place]) Active Problems:   End stage renal disease ([place])    Discharge Condition: Stable.  Diet recommendation:  Diet Order            Diet Carb Modified           Diet renal 60/70-03-22-1198                     Code Status: Full Code     Hospital Course:   [name] is a 69 y.o. female with a known history ofend-stage renal disease on hemodialysis, atrial fibrillation on Eliquis, chronic diastolic CHF, severe aortic stenosis s/p TAVR in July 2019, vitamin B12 deficiency, hypertension, hyperlipidemia and hypothyroidism, who presented to the emergency room with acute onset of worsening dyspnea, orthopnea, paroxysmal nocturnal dyspnea.  She was admitted to the hospital for acute exacerbation of chronic diastolic CHF and fluid overload from ESRD.  She was treated with IV Lasix.  She was seen in consultation by the cardiologist and nephrologist.  She underwent  hemodialysis.  Her symptoms have improved and she has been able to ambulate with mobility specialist.      Discharge Exam:   Vitals:   09/10/19 1704 09/10/19 1744  BP: (!) 119/86 109/76  Pulse: 99 94  Resp: 16 18  Temp:  97.9 F (36.6 C)  SpO2: 98% 94%   Vitals:   09/10/19 1630 09/10/19 1645 09/10/19 1704 09/10/19 1744  BP: 125/76 116/73 (!) 119/86 109/76  Pulse: (!) 107 104 99 94  Resp: 14 16 16 18   Temp:    97.9 F (36.6 C)  TempSrc:      SpO2: 98% 98% 98% 94%  Weight:      Height:         GEN: NAD SKIN: No rash EYES: EOMI ENT: MMM CV: RRR PULM: CTA B ABD: soft, obese with surgical scars, NT, +BS CNS:  AAO x 3, non focal EXT: No edema or tenderness    The results of significant diagnostics from this hospitalization (including imaging, microbiology, ancillary and laboratory) are listed below for reference.     Procedures and Diagnostic Studies:   ECHOCARDIOGRAM COMPLETE  Result Date: 09/08/2019    ECHOCARDIOGRAM REPORT   Patient Name:   [name] Date of Exam: 09/08/2019 Medical Rec #:  [id number]      Height:       63.0 in Accession #:    [id number]     Weight:       240.0 lb Date of Birth:  [birth year]       BSA:          2.089 m Patient Age:    31 years       BP:           127/63 mmHg Patient Gender: F              HR:           67 bpm. Exam Location:  [facility] Procedure: 2D Echo, Color Doppler and Cardiac Doppler Indications:     F74.94 CHF-Acute Systolic  History:  Patient has prior history of Echocardiogram examinations. CKD;                  Risk Factors:Sleep Apnea, Hypertension, Dyslipidemia and                  Diabetes. Pt tested positive for COVID-19 in 02/2019.  Sonographer:     Charmayne Sheer RDCS (AE) Referring Phys:  [id number] [place] Diagnosing Phys: Serafina Royals MD  Sonographer Comments: Suboptimal parasternal window. Image acquisition challenging due to patient body habitus. IMPRESSIONS  1. Left ventricular ejection fraction, by estimation, is 50 to 55%. The left ventricle has low normal function. The left ventricle has no regional wall motion abnormalities. There is mild left ventricular hypertrophy. Left ventricular diastolic parameters were normal.  2. Right ventricular systolic function is normal. The right ventricular size is normal.  3. The mitral valve is normal in structure. Mild mitral valve regurgitation.  4. The aortic valve has been repaired/replaced. Aortic valve regurgitation is trivial. Echo findings are consistent with normal structure and function of the aortic valve prosthesis. FINDINGS  Left Ventricle: Left ventricular ejection fraction, by estimation, is 50 to  55%. The left ventricle has low normal function. The left ventricle has no regional wall motion abnormalities. The left ventricular internal cavity size was normal in size. There is mild left ventricular hypertrophy. Left ventricular diastolic parameters were normal. Right Ventricle: The right ventricular size is normal. No increase in right ventricular wall thickness. Right ventricular systolic function is normal. Left Atrium: Left atrial size was normal in size. Right Atrium: Right atrial size was normal in size. Pericardium: There is no evidence of pericardial effusion. Mitral Valve: The mitral valve is normal in structure. Mild mitral valve regurgitation. MV peak gradient, 8.8 mmHg. The mean mitral valve gradient is 3.0 mmHg. Tricuspid Valve: The tricuspid valve is normal in structure. Tricuspid valve regurgitation is trivial. Aortic Valve: The aortic valve has been repaired/replaced. Aortic valve regurgitation is trivial. Aortic valve mean gradient measures 14.0 mmHg. Aortic valve peak gradient measures 23.4 mmHg. Aortic valve area, by VTI measures 1.36 cm. Echo findings are  consistent with normal structure and function of the aortic valve prosthesis. Pulmonic Valve: The pulmonic valve was normal in structure. Pulmonic valve regurgitation is trivial. Aorta: The aortic root and ascending aorta are structurally normal, with no evidence of dilitation. IAS/Shunts: No atrial level shunt detected by color flow Doppler.  LEFT VENTRICLE PLAX 2D LVOT diam:     2.00 cm      Diastology LV SV:         69           LV e' lateral:   6.74 cm/s LV SV Index:   33           LV E/e' lateral: 22.8 LVOT Area:     3.14 cm     LV e' medial:    4.68 cm/s                             LV E/e' medial:  32.9  LV Volumes (MOD) LV vol d, MOD A2C: 137.0 ml LV vol d, MOD A4C: 104.0 ml LV vol s, MOD A2C: 79.3 ml LV vol s, MOD A4C: 79.4 ml LV SV MOD A2C:     57.7 ml LV SV MOD A4C:     104.0 ml LV SV MOD BP:      45.2 ml LEFT  ATRIUM              Index LA Vol (A2C):   54.1 ml 25.89 ml/m LA Vol (A4C):   54.6 ml 26.13 ml/m LA Biplane Vol: 57.0 ml 27.28 ml/m  AORTIC VALVE AV Area (Vmax):    1.16 cm AV Area (Vmean):   1.18 cm AV Area (VTI):     1.36 cm AV Vmax:           242.00 cm/s AV Vmean:          181.000 cm/s AV VTI:            0.506 m AV Peak Grad:      23.4 mmHg AV Mean Grad:      14.0 mmHg LVOT Vmax:         89.20 cm/s LVOT Vmean:        67.800 cm/s LVOT VTI:          0.219 m LVOT/AV VTI ratio: 0.43  AORTA Ao Root diam: 2.40 cm MITRAL VALVE MV Area (PHT): 4.06 cm     SHUNTS MV Peak grad:  8.8 mmHg     Systemic VTI:  0.22 m MV Mean grad:  3.0 mmHg     Systemic Diam: 2.00 cm MV Vmax:       1.48 m/s MV Vmean:      75.7 cm/s MV Decel Time: 187 msec MV E velocity: 154.00 cm/s MV A velocity: 82.60 cm/s MV E/A ratio:  1.86 Serafina Royals MD Electronically signed by Serafina Royals MD Signature Date/Time: 09/08/2019/5:35:45 PM    Final      Labs:   Basic Metabolic Panel: Recent Labs  Lab 09/07/19 1522 09/07/19 1522 09/08/19 0651 09/08/19 0651 09/09/19 0605 09/10/19 0517  NA 138  --  138  --  140 137  K 4.5   < > 4.6   < > 4.0 4.3  CL 99  --  97*  --  98 96*  CO2 26  --  29  --  32 32  GLUCOSE 178*  --  150*  --  244* 161*  BUN 28*  --  38*  --  25* 37*  CREATININE 3.49*  --  4.25*  --  3.27* 4.27*  CALCIUM 8.7*  --  8.7*  --  8.5* 8.3*   < > = values in this interval not displayed.   GFR Estimated Creatinine Clearance: 14.9 mL/min (A) (by C-G formula based on SCr of 4.27 mg/dL (H)). Liver Function Tests: No results for input(s): AST, ALT, ALKPHOS, BILITOT, PROT, ALBUMIN in the last 168 hours. No results for input(s): LIPASE, AMYLASE in the last 168 hours. No results for input(s): AMMONIA in the last 168 hours. Coagulation profile No results for input(s): INR, PROTIME in the last 168 hours.  CBC: Recent Labs  Lab 09/07/19 1522 09/08/19 0651  WBC 8.4 9.2  NEUTROABS  --  6.5  HGB 11.4* 10.9*  HCT 36.9 35.8*  MCV 91.1  92.3  PLT 212 215   Cardiac Enzymes: No results for input(s): CKTOTAL, CKMB, CKMBINDEX, TROPONINI in the last 168 hours. BNP: Invalid input(s): POCBNP CBG: Recent Labs  Lab 09/09/19 1643 09/09/19 2050 09/10/19 0757 09/10/19 1110 09/10/19 1740  GLUCAP 184* 139* 151* 146* 129*   D-Dimer No results for input(s): DDIMER in the last 72 hours. Hgb A1c Recent Labs    09/07/19 2128  HGBA1C 6.8*   Lipid Profile No results for input(s): CHOL, HDL, LDLCALC, TRIG, CHOLHDL, LDLDIRECT in  the last 72 hours. Thyroid function studies Recent Labs    09/07/19 2128  TSH 5.687*   Anemia work up No results for input(s): VITAMINB12, FOLATE, FERRITIN, TIBC, IRON, RETICCTPCT in the last 72 hours. Microbiology Recent Results (from the past 240 hour(s))  SARS Coronavirus 2 by RT PCR (hospital order, performed in [facility] hospital lab) Nasopharyngeal Nasopharyngeal Swab     Status: None   Collection Time: 09/07/19  9:28 PM   Specimen: Nasopharyngeal Swab  Result Value Ref Range Status   SARS Coronavirus 2 NEGATIVE NEGATIVE Final    Comment: (NOTE) SARS-CoV-2 target nucleic acids are NOT DETECTED.  The SARS-CoV-2 RNA is generally detectable in upper and lower respiratory specimens during the acute phase of infection. The lowest concentration of SARS-CoV-2 viral copies this assay can detect is 250 copies / mL. A negative result does not preclude SARS-CoV-2 infection and should not be used as the sole basis for treatment or other patient management decisions.  A negative result may occur with improper specimen collection / handling, submission of specimen other than nasopharyngeal swab, presence of viral mutation(s) within the areas targeted by this assay, and inadequate number of viral copies (<250 copies / mL). A negative result must be combined with clinical observations, patient history, and epidemiological information.  Fact Sheet for Patients:    StrictlyIdeas.no  Fact Sheet for Healthcare Providers: BankingDealers.co.za  This test is not yet approved or  cleared by the [place] FDA and has been authorized for detection and/or diagnosis of SARS-CoV-2 by FDA under an Emergency Use Authorization (EUA).  This EUA will remain in effect (meaning this test can be used) for the duration of the COVID-19 declaration under Section 564(b)(1) of the Act, 21 U.S.C. section 360bbb-3(b)(1), unless the authorization is terminated or revoked sooner.  Performed at [facility], [address] [place], [place] [address]   MRSA PCR Screening     Status: Abnormal   Collection Time: 09/08/19  9:19 PM   Specimen: Nasal Mucosa; Nasopharyngeal  Result Value Ref Range Status   MRSA by PCR POSITIVE (A) NEGATIVE Final    Comment:        The GeneXpert MRSA Assay (FDA approved for NASAL specimens only), is one component of a comprehensive MRSA colonization surveillance program. It is not intended to diagnose MRSA infection nor to guide or monitor treatment for MRSA infections. kara campbell @2300  on 09/08/19 Performed at [facility], [address] [place], [place] [address]      Discharge Instructions:   Discharge Instructions    Diet Carb Modified   Complete by: As directed    Diet renal 60/70-03-22-1198   Complete by: As directed    Discharge instructions   Complete by: As directed    Follow-up with nephrologist for outpatient hemodialysis as scheduled   Increase activity slowly   Complete by: As directed      Allergies as of 09/10/2019      Reactions   Buspirone Other (See Comments)   Weakness   Diltiazem Hcl Palpitations   Gabapentin Palpitations   Hydralazine Rash   Lisinopril Cough   Lovastatin Palpitations   Metformin Diarrhea   Pravastatin Other (See Comments)   Insomnia   Sitagliptin Other (See Comments)   Constipation   Tramadol Itching       Medication List    TAKE these medications   amiodarone 200 MG tablet Commonly known as: PACERONE Take 200 mg by mouth daily.   atorvastatin 40 MG  tablet Commonly known as: LIPITOR Take 40 mg by mouth daily.   DULoxetine 30 MG capsule Commonly known as: CYMBALTA Take 30 mg by mouth daily.   Dupixent 300 MG/2ML prefilled syringe Generic drug: dupilumab Inject 300 mg into the skin every 14 (fourteen) days.   Eliquis 5 MG Tabs tablet Generic drug: apixaban Take 5 mg by mouth 2 (two) times daily.   HumaLOG KwikPen 100 UNIT/ML KwikPen Generic drug: insulin lispro Inject 0.22-0.38 mLs (22-38 Units total) into the skin See admin instructions. Inject 22u under the skin before breakfast and inject 38u under the skin before supper   insulin glargine 100 unit/mL Sopn Commonly known as: LANTUS Inject 45 Units into the skin at bedtime.   levothyroxine 88 MCG tablet Commonly known as: SYNTHROID Take 88 mcg by mouth daily before breakfast.   mupirocin ointment 2 % Commonly known as: BACTROBAN Place 1 application into the nose 2 (two) times daily for 4 days.   oxyCODONE 5 MG immediate release tablet Commonly known as: Oxy IR/ROXICODONE Take 1 tablet (5 mg total) by mouth every 8 (eight) hours as needed for severe pain. Must last 30 days   polyethylene glycol 17 g packet Commonly known as: MIRALAX / GLYCOLAX Take 17 g by mouth daily as needed for moderate constipation.   torsemide 20 MG tablet Commonly known as: DEMADEX Take 40 mg by mouth daily.   Vitamin D (Ergocalciferol) 1.25 MG (50000 UNIT) Caps capsule Commonly known as: DRISDOL Take 50,000 Units by mouth every Tuesday.       Follow-up Information    [facility] Follow up on 10/29/2019.   Specialty: Cardiology Why: at 2:00pm. Enter through the [place] entrance Contact information: [place] [phone]        Tama High III, MD Follow up in 7 day(s).   Specialty: Internal Medicine Why: Please follow up within 5-7 days of discharge.  Contact information: [place] [address] [phone]                Time coordinating discharge: 28 minutes  Signed:  Marqual Mi  [facility] 09/10/2019, 6:00 PM

## 2019-09-10 NOTE — Discharge Instructions (Signed)
Heart Failure, Self Care Heart failure is a serious condition. This document explains the things you need to do to take care of yourself after a heart failure diagnosis. You may be asked to change your diet, take certain medicines, and make other lifestyle changes in order to stay as healthy as possible. Your health care provider may also give you more specific instructions. If you have problems or questions, contact your health care provider. What are the risks? Having heart failure puts you at higher risk for certain problems. These problems can get worse if you do not take good care of yourself. Problems may include:  Blood clotting problems. This may cause a stroke.  Damage to the kidneys, liver, or lungs.  Abnormal heart rhythms. Supplies needed:  Scale for monitoring weight.  Blood pressure monitor.  Notebook.  Medicines. How to care for yourself when you have heart failure Medicines Take over-the-counter and prescription medicines only as told by your health care provider. Medicines reduce the workload of your heart, slow the progression of heart failure, and improve symptoms. Take your medicines every day.  Do not stop taking your medicine unless your health care provider tells you to do so.  Do not skip any dose of medicine.  Refill your prescriptions before you run out of medicine. Eating and drinking   Eat heart-healthy foods. Talk with a dietitian to make an eating plan that is right for you. ? Choose foods that contain no trans fat and are low in saturated fat and cholesterol. Healthy choices include fresh or frozen fruits and vegetables, fish, lean meats, legumes, fat-free or low-fat dairy products, and whole-grain or high-fiber foods. ? Limit salt (sodium) if told by your health care provider. Sodium restriction may reduce symptoms of heart failure. Ask a dietitian to recommend heart-healthy seasonings. ? Use healthy cooking methods instead of frying. Healthy methods  include roasting, grilling, broiling, baking, poaching, steaming, and stir-frying.  Limit your fluid intake, if directed by your health care provider. Fluid restriction may reduce symptoms of heart failure. Alcohol use  Do not drink alcohol if: ? Your health care provider tells you not to drink. ? Your heart was damaged by alcohol, or you have severe heart failure. ? You are pregnant, may be pregnant, or are planning to become pregnant.  If you drink alcohol: ? Limit how much you use to:  0-1 drink a day for women.  0-2 drinks a day for men. ? Be aware of how much alcohol is in your drink. In the [place] one drink equals one 12 oz bottle of beer (355 mL), one 5 oz glass of wine (148 mL), or one 1 oz glass of hard liquor (44 mL). Lifestyle   Do not use any products that contain nicotine or tobacco, such as cigarettes, e-cigarettes, and chewing tobacco. If you need help quitting, ask your health care provider. ? Do not use nicotine gum or patches before talking to your health care provider.  Do not use illegal drugs.  Work with your health care provider to safely reach the right body weight.  Do physical activity if told by your health care provider. Talk to your health care provider before you begin an exercise if: ? You are an older adult. ? You have severe heart failure.  Learn to manage stress. If you need help to do this, ask your health care provider.  Participate in or seek rehabilitation as needed to keep or improve your independence and quality of life.  Plan  rest periods when you get tired. Monitoring important information   Weigh yourself every day. This will help you to notice if too much fluid is building up in your body. ? Weigh yourself every morning after you urinate and before you eat breakfast. ? Wear the same amount of clothing each time you weigh yourself. ? Record your daily weight. Provide your health care provider with your weight record.  Monitor and  record your pulse and blood pressure as told by your health care provider. Dealing with extreme temperatures  If the weather is extremely hot: ? Avoid vigorous physical activity. ? Use air conditioning or fans, or find a cooler location. ? Avoid caffeine and alcohol. ? Wear loose-fitting, lightweight, and light-colored clothing.  If the weather is extremely cold: ? Avoid vigorous activity. ? Layer your clothes. ? Wear mittens or gloves, a hat, and a scarf when you go outside. ? Avoid alcohol. Follow these instructions at home:  Stay up to date with vaccines. Pneumococcal and flu (influenza) vaccines are especially important in preventing infections of the airways.  Keep all follow-up visits as told by your health care provider. This is important. Contact a health care provider if you:  Have a rapid weight gain.  Have increasing shortness of breath.  Are unable to participate in your usual physical activities.  Get tired easily.  Cough more than normal, especially with physical activity.  Lose your appetite or feel nauseous.  Have any swelling or more swelling in areas such as your hands, feet, ankles, or abdomen.  Are unable to sleep because it is hard to breathe.  Feel like your heart is beating quickly (palpitations).  Become dizzy or light-headed when you stand up. Get help right away if you:  Have trouble breathing.  Notice or your family notices a change in your awareness, such as having trouble staying awake or concentrating.  Have pain or discomfort in your chest.  Have an episode of fainting (syncope). These symptoms may represent a serious problem that is an emergency. Do not wait to see if the symptoms will go away. Get medical help right away. Call your local emergency services (911 in the [place] Do not drive yourself to the hospital. Summary  Heart failure is a serious condition. To care for yourself, you may be asked to change your diet, take certain  medicines, and make other lifestyle changes.  Take your medicines every day. Do not stop taking them unless your health care provider tells you to do so.  Eat heart-healthy foods, such as fresh or frozen fruits and vegetables, fish, lean meats, legumes, fat-free or low-fat dairy products, and whole-grain or high-fiber foods.  Ask your health care provider if you have any alcohol restrictions. You may have to stop drinking alcohol if you have severe heart failure.  Contact your health care provider if you notice problems, such as rapid weight gain or a fast heartbeat. Get help right away if you faint, or have chest pain or trouble breathing. This information is not intended to replace advice given to you by your health care provider. Make sure you discuss any questions you have with your health care provider. Document Revised: 05/19/2018 Document Reviewed: 05/20/2018 Elsevier Patient Education  [place].   Correction Insulin  Correction insulin, also called corrective insulin or a supplemental dose, is a small amount of insulin that can be used to lower your blood sugar (glucose) if it is too high. You may be instructed to check your  blood glucose at certain times of the day and to use correction insulin as needed to lower your blood glucose to your target range. Correction insulin is primarily used as part of diabetes management. It may also be prescribed for people who do not have diabetes. What is a correction scale? A correction scale, also called a sliding scale, is prescribed by your health care provider to help you determine when you need correction insulin. Your correction scale is based on your individual treatment goals, and it has two parts:  Ranges of blood glucose levels.  How much correction insulin to give yourself if your blood sugar falls within a certain range. If your blood glucose is in your desired range, you will not need correction insulin and you should take  your normal insulin dose. What type of insulin do I need? Your health care provider may prescribe rapid-acting or short-acting insulin for you to use as correction insulin. Rapid-acting insulin:  Starts working quickly, in as little as 5 minutes.  Can last for 3-6 hours.  Works well when taken right before a meal to quickly lower blood glucose. Short-acting insulin:  Starts working in about 30 minutes.  Can last for 6-8 hours.  Should be taken about 30 minutes before you start eating a meal. Talk with your health care provider or pharmacist about which type of correction insulin to take and when to take it. If you use insulin to control your diabetes, you should use correction insulin in addition to the longer-acting (basal) insulin that you normally use. How do I manage my blood glucose with correction insulin? Giving a correction dose  Check your blood glucose as directed by your health care provider.  Use your correction scale to find the range that your blood glucose is in.  Identify the units of insulin that match your blood glucose range.  Make sure you have food available that you can eat in the next 15-30 minutes, after your correction dose.  Give yourself the dose of correction insulin that your health care provider has prescribed in your correction scale. Always make sure you are using the right type of insulin. ? If your correction insulin is rapid-acting, start eating a meal within 15 minutes after your correction dose to keep your blood glucose from getting too low. ? If your correction insulin is short-acting, start eating a meal within 30 minutes after your correction dose to keep your blood glucose from getting too low. Keeping a blood glucose log   Write down your blood glucose test results and the amount of insulin that you give yourself. Do this every time you check blood glucose or take insulin. Bring this log with you to your medical visits. This information  will help your health care provider to manage your medicines.  Note anything that may affect your blood glucose, such as: ? Changes in normal exercise or activity. ? Changes in your normal schedule, such as changes in your sleep routine, going on vacation, changing your diet, or holidays. ? New over-the-counter or prescription medicines. ? Illness, stress, or anxiety. ? Changes in the time that you took your medicine or insulin. ? Changes in your meals, such as skipping a meal, having a late meal, or dining out. ? Eating things that may affect blood glucose, such as snacks, meal portions that are larger than normal, drinks that contain sugar, or eating less than usual. What do I need to know about hyperglycemia and hypoglycemia? What is hyperglycemia? Hyperglycemia, also  called high blood glucose, occurs when blood glucose is too high. Make sure you know the early signs of hyperglycemia, such as:  Increased thirst.  Hunger.  Feeling very tired.  Needing to urinate more often than usual.  Blurry vision. What is hypoglycemia? Hypoglycemia is also called low blood glucose. Be aware of "stacking" your insulin doses. This happens when you correct a high blood glucose by giving yourself extra insulin too soon after a previous correction dose or mealtime dose. This may cause you to have too much insulin in your body and may put you at risk for hypoglycemia. Hypoglycemia occurs with a blood glucose level at or below 70 mg/dL (3.9 mmol/L). It is important to know the symptoms of hypoglycemia and treat it right away. Always have a 15-gram rapid-acting carbohydrate snack with you to treat low blood glucose. Family members and close friends should also know the symptoms and should understand how to treat hypoglycemia, in case you are not able to treat yourself. What are the symptoms of hypoglycemia? Hypoglycemia symptoms can include:  Hunger.  Anxiety.  Sweating and feeling  clammy.  Confusion.  Dizziness or light-headedness.  Sleepiness.  Nausea.  Increased heart rate.  Headache.  Blurry vision.  Jerky movements that you cannot control (seizure).  Nightmares.  Tingling or numbness around the mouth, lips, or tongue.  A change in speech.  Decreased ability to concentrate.  A change in coordination.  Restless sleep.  Tremors or shakes.  Fainting.  Irritability. How do I treat hypoglycemia? If you are alert and able to swallow safely, follow the 15:15 rule:  Take 15 grams of a rapid-acting carbohydrate. Rapid-acting options include: ? 1 tube of glucose gel. ? 3 glucose pills. ? 6-8 pieces of hard candy. ? 4 oz (120 mL) of fruit juice. ? 4 oz (120 mL) of regular (not diet) soda.  Check your blood glucose 15 minutes after you take the carbohydrate. ? If the repeat blood glucose level is still at or below 70 mg/dL (3.9 mmol/L), take 15 grams of a carbohydrate again. ? If your blood glucose level does not increase above 70 mg/dL (3.9 mmol/L) after 3 tries, seek emergency medical care.  After your blood glucose level returns to normal, eat a meal or a snack within 1 hour.  How do I treat severe hypoglycemia? Severe hypoglycemia is when your blood glucose level is at or below 54 mg/dL (3 mmol/L). Severe hypoglycemia is an emergency. Do not wait to see if the symptoms will go away. Get medical help right away. Call your local emergency services (911 in the [place] Do not drive yourself to the hospital. If you have severe hypoglycemia and you cannot eat or drink, you may need an injection of glucagon. A family member or close friend should learn how to check your blood glucose and how to give you a glucagon injection. Ask your health care provider if you need to have an emergency glucagon injection kit available. Severe hypoglycemia may need to be treated in a hospital. The treatment may include getting glucose through an IV tube. You may also  need treatment for the cause of your hypoglycemia. Why do I need correction insulin if I do not have diabetes? If you do not have diabetes, your health care provider may prescribe insulin because:  Keeping your blood glucose in the target range is important for your overall health.  You are taking medicines that cause your blood glucose to be higher than normal. Contact a health  care provider if:  You develop low blood glucose that you are not able to treat yourself.  You have high blood glucose that you are not able to correct with correction insulin.  Your blood glucose is often too low.  You used emergency glucagon to treat low blood glucose. Get help right away if:  You become unresponsive. If this happens, someone else should call emergency services (911 in the [place] right away.  Your blood glucose is lower than 54 mg/dL (3.0 mmol/L).  You become confused or you have trouble thinking clearly.  You have difficulty breathing. Summary  Correction insulin is a small amount of insulin that can be used to lower your blood sugar (glucose) if it is too high.  Talk with your health care provider or pharmacist about which type of correction insulin to take and when to take it. If you use insulin to control your diabetes, you should use correction insulin in addition to the longer-acting (basal) insulin that you normally use.  You may be instructed to check your blood glucose at certain times of the day and to use correction insulin as needed to lower your blood glucose to your target range. Always keep a log of your blood glucose values and the amount of insulin that you used.  It is important to know the symptoms of hypoglycemia and treat it right away. Always have a 15-gram rapid-acting carbohydrate snack with you to treat low blood glucose. Family members and close friends should also know the symptoms and should understand how to treat hypoglycemia, in case you are not able to treat  yourself. This information is not intended to replace advice given to you by your health care provider. Make sure you discuss any questions you have with your health care provider. Document Revised: 02/15/2016 Document Reviewed: 11/03/2015 Elsevier Patient Education  2020 [employer].

## 2019-09-10 NOTE — Progress Notes (Signed)
Mobility Specialist - Progress Note   09/10/19 1238  Mobility  Activity Ambulated in room;Ambulated in hall  Level of Assistance Modified independent, requires aide device or extra time (CGA for safety precautions)  Assistive Device Front wheel walker  Distance Ambulated (ft) 60 ft  Mobility Response Tolerated well  Mobility performed by Mobility specialist  $Mobility charge 1 Mobility    Pre-mobility: 107 HR, 102/72 BP, 99% SpO2 During mobility: 106 HR, 96% SpO2 Post-mobility: 106 HR, 118/88 BP, 100% SPO2   Per discussion w/ nurse, pt receiving hemodialysis tx "no time soon", able to ambulate pt this AM. Pt was in bed upon arrival. Pt agreed to session. Pt was mod. Independent getting to EOB and sit-to-stand. Pt c/o SOB, but wanted to proceed. Pt ambulated 60' total mod. Independent using RW and CGA for safety precautions. Pt able to maintain O2 > 95% using 2L of O2. Overall, pt tolerated session well. Pt left in bed with call bell and phone in reach. Nurse was notified.    Yussef Jorge Mobility Specialist  09/10/19, 12:45 PM

## 2019-09-10 NOTE — Plan of Care (Signed)
  Problem: Education: Goal: Knowledge of General Education information will improve Description: Including pain rating scale, medication(s)/side effects and non-pharmacologic comfort measures Outcome: Progressing   Problem: Clinical Measurements: Goal: Respiratory complications will improve Outcome: Progressing  Dyspnea on exertion Problem: Activity: Goal: Risk for activity intolerance will decrease Outcome: Not Progressing  Dyspnea on exertion

## 2019-09-10 NOTE — Progress Notes (Signed)
Patient is alert and oriented taken to dialysis at this time

## 2019-09-10 NOTE — Progress Notes (Signed)
[name]  ROUNDING NOTE   Subjective:   Patient continues to have shortness of breath with exertion. Witnessed patient ambulate to the bathroom without oxygen without much difficulties.   Hemodialysis for later today.   Objective:  Vital signs in last 24 hours:  Temp:  [97.9 F (36.6 C)-98.6 F (37 C)] 97.9 F (36.6 C) (07/23 0757) Pulse Rate:  [65-117] 117 (07/23 1000) Resp:  [17-20] 20 (07/23 1000) BP: (98-139)/(51-108) 121/78 (07/23 1000) SpO2:  [93 %-99 %] 98 % (07/23 1000) Weight:  [111.4 kg] 111.4 kg (07/23 0506)  Weight change: -0.049 kg Filed Weights   09/08/19 2020 09/09/19 0404 09/10/19 0506  Weight: 111.4 kg 111.2 kg (!) 111.4 kg    Intake/Output: I/O last 3 completed shifts: In: 258 [P.O.:957] Out: 700 [Urine:700]   Intake/Output this shift:  No intake/output data recorded.  Physical Exam: General: NAD,   Head: Normocephalic, atraumatic. Moist oral mucosal membranes  Eyes: Anicteric, PERRL  Neck: Supple, trachea midline  Lungs:  Clear to auscultation  Heart: Regular rate and rhythm  Abdomen:  Soft, nontender,   Extremities:  no peripheral edema.  Neurologic: Nonfocal, moving all four extremities  Skin: No lesions  Access: Left AVF    Basic Metabolic Panel: Recent Labs  Lab 09/07/19 1522 09/07/19 1522 09/08/19 0651 09/09/19 0605 09/10/19 0517  NA 138  --  138 140 137  K 4.5  --  4.6 4.0 4.3  CL 99  --  97* 98 96*  CO2 26  --  29 32 32  GLUCOSE 178*  --  150* 244* 161*  BUN 28*  --  38* 25* 37*  CREATININE 3.49*  --  4.25* 3.27* 4.27*  CALCIUM 8.7*   < > 8.7* 8.5* 8.3*   < > = values in this interval not displayed.    Liver Function Tests: No results for input(s): AST, ALT, ALKPHOS, BILITOT, PROT, ALBUMIN in the last 168 hours. No results for input(s): LIPASE, AMYLASE in the last 168 hours. No results for input(s): AMMONIA in the last 168 hours.  CBC: Recent Labs  Lab 09/07/19 1522 09/08/19 0651  WBC 8.4 9.2   NEUTROABS  --  6.5  HGB 11.4* 10.9*  HCT 36.9 35.8*  MCV 91.1 92.3  PLT 212 215    Cardiac Enzymes: No results for input(s): CKTOTAL, CKMB, CKMBINDEX, TROPONINI in the last 168 hours.  BNP: Invalid input(s): POCBNP  CBG: Recent Labs  Lab 09/09/19 1225 09/09/19 1643 09/09/19 2050 09/10/19 0757 09/10/19 1110  GLUCAP 128* 184* 139* 151* 146*    Microbiology: Results for orders placed or performed during the hospital encounter of 09/07/19  SARS Coronavirus 2 by RT PCR (hospital order, performed in [facility] hospital lab) Nasopharyngeal Nasopharyngeal Swab     Status: None   Collection Time: 09/07/19  9:28 PM   Specimen: Nasopharyngeal Swab  Result Value Ref Range Status   SARS Coronavirus 2 NEGATIVE NEGATIVE Final    Comment: (NOTE) SARS-CoV-2 target nucleic acids are NOT DETECTED.  The SARS-CoV-2 RNA is generally detectable in upper and lower respiratory specimens during the acute phase of infection. The lowest concentration of SARS-CoV-2 viral copies this assay can detect is 250 copies / mL. A negative result does not preclude SARS-CoV-2 infection and should not be used as the sole basis for treatment or other patient management decisions.  A negative result may occur with improper specimen collection / handling, submission of specimen other than nasopharyngeal swab, presence of viral mutation(s) within  the areas targeted by this assay, and inadequate number of viral copies (<250 copies / mL). A negative result must be combined with clinical observations, patient history, and epidemiological information.  Fact Sheet for Patients:   StrictlyIdeas.no  Fact Sheet for Healthcare Providers: BankingDealers.co.za  This test is not yet approved or  cleared by the [place] FDA and has been authorized for detection and/or diagnosis of SARS-CoV-2 by FDA under an Emergency Use Authorization (EUA).  This EUA will remain in  effect (meaning this test can be used) for the duration of the COVID-19 declaration under Section 564(b)(1) of the Act, 21 U.S.C. section 360bbb-3(b)(1), unless the authorization is terminated or revoked sooner.  Performed at [facility], [address] [place], [place] [address]   MRSA PCR Screening     Status: Abnormal   Collection Time: 09/08/19  9:19 PM   Specimen: Nasal Mucosa; Nasopharyngeal  Result Value Ref Range Status   MRSA by PCR POSITIVE (A) NEGATIVE Final    Comment:        The GeneXpert MRSA Assay (FDA approved for NASAL specimens only), is one component of a comprehensive MRSA colonization surveillance program. It is not intended to diagnose MRSA infection nor to guide or monitor treatment for MRSA infections. kara campbell @2300  on 09/08/19 Performed at [facility], [address] [place], [place] [address]     Coagulation Studies: No results for input(s): LABPROT, INR in the last 72 hours.  Urinalysis: No results for input(s): COLORURINE, LABSPEC, PHURINE, GLUCOSEU, HGBUR, BILIRUBINUR, KETONESUR, PROTEINUR, UROBILINOGEN, NITRITE, LEUKOCYTESUR in the last 72 hours.  Invalid input(s): APPERANCEUR    Imaging: No results found.   Medications:   . sodium chloride     . amiodarone  200 mg Oral Daily  . apixaban  5 mg Oral BID  . atorvastatin  40 mg Oral Daily  . Chlorhexidine Gluconate Cloth  6 each Topical Q0600  . DULoxetine  30 mg Oral Daily  . dupilumab  300 mg Subcutaneous Q14 Days  . furosemide  40 mg Intravenous Q12H  . insulin aspart  0-9 Units Subcutaneous TID WC  . insulin glargine  35 Units Subcutaneous QHS  . levothyroxine  88 mcg Oral QAC breakfast  . mouth rinse  15 mL Mouth Rinse BID  . mupirocin ointment  1 application Nasal BID  . sodium chloride flush  3 mL Intravenous Q12H  . [START ON 09/14/2019] Vitamin D (Ergocalciferol)  50,000 Units Oral Q Tue   sodium chloride, acetaminophen, ALPRAZolam,  betamethasone valerate ointment, ondansetron (ZOFRAN) IV, oxyCODONE, polyethylene glycol, sodium chloride flush, zolpidem  Assessment/ Plan:  [name] is a 69 y.o. black female with end stage renal disease on hemodialysis, hypertension, diabetes mellitus type II, atrial fibrillation on eliquis, congestive heart failure, hypothyroidism who is admitted to [facility] on 09/07/2019 for SOB (shortness of breath) [R06.02] Acute CHF (congestive heart failure) (HCC) [I50.9] Acute on chronic diastolic CHF (congestive heart failure) (HCC) [I50.33] Acute on chronic congestive heart failure, unspecified heart failure type ([place]) [I50.9]  [facility] M[facility] Left AVF 114kg.   1. End Stage Renal Disease: Seems that patient's estimated dry weight needs to be adjusted.  - Discharge with target weight to 111kg.  - Dialysis for today.   2. Hypertension: with acute exacerbation of congestive heart failure - IV furosemide.   3. Anemia of chronic kidney disease: hemoglobin 10.9.  - EPO with HD treatment.  4. Secondary Hyperparathyroidism: not currently on binders.  LOS: 2 Kinzlee Selvy 7/23/202111:23 AM

## 2019-09-10 NOTE — Progress Notes (Signed)
Patient discharged with belongings.

## 2019-09-17 ENCOUNTER — Telehealth: Payer: Self-pay | Admitting: Family

## 2019-09-17 NOTE — Telephone Encounter (Signed)
Unable to reach patient regarding her follow up [name] appointment that was made during her recent hospital admission.   [name], NT

## 2019-09-20 ENCOUNTER — Telehealth: Payer: Self-pay

## 2019-09-20 NOTE — Telephone Encounter (Signed)
She has an appointment on 8/11 for in clinic and she states she has been out of breath and weak and wants to know if she can do it virtually.

## 2019-09-20 NOTE — Telephone Encounter (Signed)
Attempted to reach patient, I think voicemail picked up, I left message to please call our office re; her appt time.

## 2019-09-29 ENCOUNTER — Encounter: Payer: Self-pay | Admitting: Pain Medicine

## 2019-09-29 ENCOUNTER — Other Ambulatory Visit: Payer: Self-pay

## 2019-09-29 ENCOUNTER — Ambulatory Visit: Payer: Medicare Other | Attending: Pain Medicine | Admitting: Pain Medicine

## 2019-09-29 VITALS — BP 119/77 | HR 105 | Temp 97.7°F | Resp 16 | Ht 63.0 in | Wt 235.0 lb

## 2019-09-29 DIAGNOSIS — M79605 Pain in left leg: Secondary | ICD-10-CM

## 2019-09-29 DIAGNOSIS — M5441 Lumbago with sciatica, right side: Secondary | ICD-10-CM

## 2019-09-29 DIAGNOSIS — G8929 Other chronic pain: Secondary | ICD-10-CM

## 2019-09-29 DIAGNOSIS — M5442 Lumbago with sciatica, left side: Secondary | ICD-10-CM | POA: Diagnosis not present

## 2019-09-29 DIAGNOSIS — Z79899 Other long term (current) drug therapy: Secondary | ICD-10-CM

## 2019-09-29 DIAGNOSIS — G894 Chronic pain syndrome: Secondary | ICD-10-CM

## 2019-09-29 DIAGNOSIS — M79604 Pain in right leg: Secondary | ICD-10-CM

## 2019-09-29 MED ORDER — OXYCODONE HCL 5 MG PO TABS: 5.0000 mg | ORAL_TABLET | Freq: Three times a day (TID) | ORAL | 0 refills | Status: DC | PRN

## 2019-09-29 NOTE — Progress Notes (Signed)
PROVIDER NOTE: Information contained herein reflects review and annotations entered in association with encounter. Interpretation of such information and data should be left to medically-trained personnel. Information provided to patient can be located elsewhere in the medical record under "Patient Instructions". Document created using STT-dictation technology, any transcriptional errors that may result from process are unintentional.    Patient: [name]  Service Category: E/M  Provider: Gaspar Cola, MD  DOB: 1950/04/06  DOS: 09/29/2019  Specialty: Interventional Pain Management  MRN: [id number]  Setting: Ambulatory outpatient  PCP: Adin Hector, MD  Type: Established Patient    Referring Provider: Adin Hector, MD  Location: Office  Delivery: Face-to-face     HPI  Reason for encounter: [name], a 69 y.o. year old female, is here today for evaluation and management of her Chronic pain syndrome [G89.4]. [name] primary complain today is Back Pain (lower) Last encounter: Practice (09/20/2019). My last encounter with her was on Visit date not found. Pertinent problems: [name] has Arthritis; Neurogenic pain; Chronic musculoskeletal pain; Visceral abdominal pain; Diabetic peripheral neuropathy ([place]); Chronic low back pain (Primary Area of Pain) (Bilateral) (L>R); Chronic lower extremity pain (Secondary area of Pain) (Bilateral) (R>L); Chronic hip pain (Bilateral) (L>R); Chronic neck pain (Bilateral) (L>R); Diabetic neuropathy ([place]); Lumbar spondylosis; Lumbar facet syndrome (Bilateral) (L>R); Polyneuropathy; Chronic pain syndrome; Myofascial pain; Chronic pain of left upper extremity; Cervical myelopathy ([place]); DM type 2 with diabetic peripheral neuropathy ([place]); Spondylosis without myelopathy or radiculopathy, lumbosacral region; and DDD (degenerative disc disease), lumbosacral on their pertinent problem list. Pain Assessment: Severity of Chronic pain is reported as a 6  /10. Location: Back Lower, Right, Left/hips bilateralradiates down fron of legs to the top of feet. Onset: More than a month ago. Quality: Aching, Discomfort. Timing: Intermittent. Modifying factor(s): medications. Vitals:  height is '5\' 3"'  (1.6 m) and weight is 235 lb (106.6 kg). Her temperature is 97.7 F (36.5 C). Her blood pressure is 119/77 and her pulse is 105 (abnormal). Her respiration is 16 and oxygen saturation is 97%.    The patient indicates doing well with the current medication regimen. No adverse reactions or side effects reported to the medications.  The patient comes into the clinic today still on a wheelchair and indicating that as long as she takes her medicine she is doing well.  She is on the oxycodone IR 5 mg 1 tablet p.o. every 8 hours for a total of 15 mg/day of oxycodone and an approximated MME of 22.5.  So far she has not had any problems with obtaining her medicine even though she uses [facility].  The patient was nevertheless informed of the medication shortage and I have explained to her that should she not be able to get her medicine, we will probably switching her to a different type of medicine.  Today I have reviewed her medications and the medications that she can take for pain and she could be a suitable candidate for extended release morphine 15 mg p.o. twice daily which would give her an approximate MME of 30/day.  Today I have provided the patient with enough prescriptions to last until 12/30/2019.  I have also informed her that we will be transferring her medication management to Dr. Gillis Santa.  She has not required any interventional therapies since 2017.  Today she did return some on used hydrocodone that she had from another physician that gave her a prescription for an apparent acute problem.  Today  we have counted the pills and documented the prescription and we have also disposed of the medications in front of the patient.  Pharmacotherapy Assessment   Analgesic:  Oxycodone IR 79m, 1 tab PO q 8hrs (136mday of oxycodone) MME/day:22.22m41may.   Monitoring: [place] PMP: PDMP reviewed during this encounter.       Pharmacotherapy: No side-effects or adverse reactions reported. Compliance: No problems identified. Effectiveness: Clinically acceptable.  GarIgnatius SpeckingN  09/29/2019  2:13 PM  Sign when Signing Visit Nursing Pain Medication Assessment:  Safety precautions to be maintained throughout the outpatient stay will include: orient to surroundings, keep bed in low position, maintain call bell within reach at all times, provide assistance with transfer out of bed and ambulation.  Medication Inspection Compliance: Pill count conducted under aseptic conditions, in front of the patient. Neither the pills nor the bottle was removed from the patient's sight at any time. Once count was completed pills were immediately returned to the patient in their original bottle.  Medication: Oxycodone ER (OxyContin) Pill/Patch Count: 62 of 90 pills remain Pill/Patch Appearance: Markings consistent with prescribed medication Bottle Appearance: Standard pharmacy container. Clearly labeled. Filled Date: 7 / 17 / 2021 Last Medication intake:  Yesterday   Patient brought medication that was given to her Hydrocodone 5/325 mg 19 pills [facility]/20 [facility] Prescription # [id number] G Schnier Wasted by myself and sbrown    UDS:  Summary  Date Value Ref Range Status  07/06/2019 Note  Final    Comment:    ==================================================================== [employer] [facility] [employer] (MW) ==================================================================== Test                             Result       Flag       Units Drug Present   Oxycodone                      469                     ng/mg creat   Oxymorphone                    903                     ng/mg creat   Noroxycodone                   290                     ng/mg creat    Noroxymorphone                 203                     ng/mg creat    Sources of oxycodone are scheduled prescription medications.    Oxymorphone, noroxycodone, and noroxymorphone are expected    metabolites of oxycodone. Oxymorphone is also available as a    scheduled prescription medication. ==================================================================== Test                      Result    Flag   Units      Ref Range   Creatinine              100  mg/dL      >=20 ==================================================================== Declared Medications:  Medication list was not provided. ==================================================================== For clinical consultation, please call [phone]. ====================================================================      ROS  Constitutional: Denies any fever or chills Gastrointestinal: No reported hemesis, hematochezia, vomiting, or acute GI distress Musculoskeletal: Denies any acute onset joint swelling, redness, loss of ROM, or weakness Neurological: No reported episodes of acute onset apraxia, aphasia, dysarthria, agnosia, amnesia, paralysis, loss of coordination, or loss of consciousness  Medication Review  DULoxetine, Vitamin D (Ergocalciferol), amiodarone, apixaban, atorvastatin, dupilumab, insulin glargine, insulin lispro, levothyroxine, oxyCODONE, polyethylene glycol, and torsemide  History Review  Allergy: Ms. Ciesla is allergic to buspirone, diltiazem hcl, gabapentin, hydralazine, lisinopril, lovastatin, metformin, pravastatin, sitagliptin, and tramadol. Drug: Ms. [place]  reports no history of drug use. Alcohol:  reports no history of alcohol use. Tobacco:  reports that she quit smoking about 6 years ago. Her smoking use included cigarettes. She has never used smokeless tobacco. Social: Ms. Amadon  reports that she quit smoking about 6 years ago. Her smoking use included cigarettes. She has never  used smokeless tobacco. She reports that she does not drink alcohol and does not use drugs. Medical:  has a past medical history of Anemia, Anxiety, Aortic valve stenosis, Arrhythmia, Arthritis, B12 deficiency, Bowel obstruction (HCC), CHF (congestive heart failure) ([place]), CKD (chronic kidney disease), Colostomy in place [facility]), Diabetes mellitus without complication ([place]), Dialysis patient [facility]), Diverticulitis large intestine, Dysrhythmia, Ectopic atrial tachycardia ([place]), FSGS (focal segmental glomerulosclerosis), Gastritis, GI bleed, Heart murmur, Hyperlipidemia, Hypertension, Hypothyroidism, Hypothyroidism, MRSA (methicillin resistant Staphylococcus aureus), Neuropathy, Neuropathy in diabetes ([place]), Obesity, Pedal edema, Psoriasis, Shortness of breath dyspnea, Sleep apnea, and Vertigo. Surgical: Ms. Trantham  has a past surgical history that includes Colectomy; laparotomy closure of cecal perforation (05/09/2013); Tonsillectomy; Back surgery; Cardiac catheterization; Abdominal hysterectomy; Debridement of abdominal wall abscess (N/A, 02/22/2015); Excision mass abdominal (N/A, 02/24/2015); Application if wound vac (N/A, 02/24/2015); Excision mass abdominal (N/A, 02/26/2015); Application if wound vac (N/A, 02/26/2015); Wound debridement (N/A, 03/03/2015); Wound debridement (N/A, 03/09/2015); Flexible sigmoidoscopy (06/19/2015); Colostomy reversal (N/A, 07/12/2015); laparotomy (07/12/2015); laparoscopy (07/12/2015); Lysis of adhesion (07/12/2015); laparotomy (N/A, 02/06/2015); Colonoscopy with propofol (N/A, 09/04/2016); Cardiac valve replacement (08/2017); Cardioversion (N/A, 07/15/2018); DIALYSIS/PERMA CATHETER INSERTION (N/A, 10/20/2018); Cataract extraction w/PHACO (Right, 11/16/2018); Cataract extraction w/PHACO (Left, 12/07/2018); Cholecystectomy; Hernia repair; AV fistula placement (Left, 01/29/2019); and DIALYSIS/PERMA CATHETER REMOVAL (N/A, 08/03/2019). Family: family history includes CAD in her father; Colon cancer in her  brother; Diabetes in her mother; Heart attack in her father; Hypertension in her father.  Laboratory Chemistry Profile   Renal Lab Results  Component Value Date   BUN 37 (H) 09/10/2019   CREATININE 4.27 (H) 09/10/2019   LABCREA 26 07/18/2015   GFRAA 12 (L) 09/10/2019   GFRNONAA 10 (L) 09/10/2019     Hepatic Lab Results  Component Value Date   AST 19 07/24/2017   ALT 14 07/24/2017   ALBUMIN 3.3 (L) 06/13/2019   ALKPHOS 59 07/24/2017   LIPASE 21 02/04/2015     Electrolytes Lab Results  Component Value Date   NA 137 09/10/2019   K 4.3 09/10/2019   CL 96 (L) 09/10/2019   CALCIUM 8.3 (L) 09/10/2019   MG 2.0 06/13/2019   PHOS 4.4 06/13/2019     Bone Lab Results  Component Value Date   25OHVITD1 43 11/07/2015   25OHVITD2 38 11/07/2015   25OHVITD3 5.4 11/07/2015     Inflammation (CRP: Acute Phase) (ESR: Chronic Phase) Lab Results  Component Value Date  CRP 0.6 11/07/2015   ESRSEDRATE 16 11/07/2015   LATICACIDVEN 0.8 02/21/2015       Note: Above Lab results reviewed.  Recent Imaging Review  ECHOCARDIOGRAM COMPLETE    ECHOCARDIOGRAM REPORT       Patient Name:   DEMICA ZOOK Date of Exam: 09/08/2019 Medical Rec #:  [id number]      Height:       63.0 in Accession #:    [id number]     Weight:       240.0 lb Date of Birth:  02/21/1950       BSA:          2.089 m Patient Age:    31 years       BP:           127/63 mmHg Patient Gender: F              HR:           67 bpm. Exam Location:  [facility]  Procedure: 2D Echo, Color Doppler and Cardiac Doppler  Indications:     O24.23 CHF-Acute Systolic   History:         Patient has prior history of Echocardiogram examinations. CKD;                  Risk Factors:Sleep Apnea, Hypertension, Dyslipidemia and                  Diabetes. Pt tested positive for COVID-19 in 02/2019.   Sonographer:     Charmayne Sheer RDCS (AE) Referring Phys:  [id number] [place] Diagnosing Phys: Serafina Royals MD    Sonographer Comments:  Suboptimal parasternal window. Image acquisition challenging due to patient body habitus. IMPRESSIONS   1. Left ventricular ejection fraction, by estimation, is 50 to 55%. The left ventricle has low normal function. The left ventricle has no regional wall motion abnormalities. There is mild left ventricular hypertrophy. Left ventricular diastolic  parameters were normal.  2. Right ventricular systolic function is normal. The right ventricular size is normal.  3. The mitral valve is normal in structure. Mild mitral valve regurgitation.  4. The aortic valve has been repaired/replaced. Aortic valve regurgitation is trivial. Echo findings are consistent with normal structure and function of the aortic valve prosthesis.  FINDINGS  Left Ventricle: Left ventricular ejection fraction, by estimation, is 50 to 55%. The left ventricle has low normal function. The left ventricle has no regional wall motion abnormalities. The left ventricular internal cavity size was normal in size.  There is mild left ventricular hypertrophy. Left ventricular diastolic parameters were normal.  Right Ventricle: The right ventricular size is normal. No increase in right ventricular wall thickness. Right ventricular systolic function is normal.  Left Atrium: Left atrial size was normal in size.  Right Atrium: Right atrial size was normal in size.  Pericardium: There is no evidence of pericardial effusion.  Mitral Valve: The mitral valve is normal in structure. Mild mitral valve regurgitation. MV peak gradient, 8.8 mmHg. The mean mitral valve gradient is 3.0 mmHg.  Tricuspid Valve: The tricuspid valve is normal in structure. Tricuspid valve regurgitation is trivial.  Aortic Valve: The aortic valve has been repaired/replaced. Aortic valve regurgitation is trivial. Aortic valve mean gradient measures 14.0 mmHg. Aortic valve peak gradient measures 23.4 mmHg. Aortic valve area, by VTI measures 1.36 cm. Echo findings are   consistent with normal structure and function of the aortic valve prosthesis.  Pulmonic Valve: The pulmonic valve  was normal in structure. Pulmonic valve regurgitation is trivial.  Aorta: The aortic root and ascending aorta are structurally normal, with no evidence of dilitation.  IAS/Shunts: No atrial level shunt detected by color flow Doppler.    LEFT VENTRICLE PLAX 2D LVOT diam:     2.00 cm      Diastology LV SV:         69           LV e' lateral:   6.74 cm/s LV SV Index:   33           LV E/e' lateral: 22.8 LVOT Area:     3.14 cm     LV e' medial:    4.68 cm/s                             LV E/e' medial:  32.9   LV Volumes (MOD) LV vol d, MOD A2C: 137.0 ml LV vol d, MOD A4C: 104.0 ml LV vol s, MOD A2C: 79.3 ml LV vol s, MOD A4C: 79.4 ml LV SV MOD A2C:     57.7 ml LV SV MOD A4C:     104.0 ml LV SV MOD BP:      45.2 ml  LEFT ATRIUM             Index LA Vol (A2C):   54.1 ml 25.89 ml/m LA Vol (A4C):   54.6 ml 26.13 ml/m LA Biplane Vol: 57.0 ml 27.28 ml/m  AORTIC VALVE AV Area (Vmax):    1.16 cm AV Area (Vmean):   1.18 cm AV Area (VTI):     1.36 cm AV Vmax:           242.00 cm/s AV Vmean:          181.000 cm/s AV VTI:            0.506 m AV Peak Grad:      23.4 mmHg AV Mean Grad:      14.0 mmHg LVOT Vmax:         89.20 cm/s LVOT Vmean:        67.800 cm/s LVOT VTI:          0.219 m LVOT/AV VTI ratio: 0.43   AORTA Ao Root diam: 2.40 cm  MITRAL VALVE MV Area (PHT): 4.06 cm     SHUNTS MV Peak grad:  8.8 mmHg     Systemic VTI:  0.22 m MV Mean grad:  3.0 mmHg     Systemic Diam: 2.00 cm MV Vmax:       1.48 m/s MV Vmean:      75.7 cm/s MV Decel Time: 187 msec MV E velocity: 154.00 cm/s MV A velocity: 82.60 cm/s MV E/A ratio:  1.86  Serafina Royals MD Electronically signed by Serafina Royals MD Signature Date/Time: 09/08/2019/5:35:45 PM      Final   Note: Reviewed        Physical Exam  General appearance: Well nourished, well developed, and well hydrated.  In no apparent acute distress Mental status: Alert, oriented x 3 (person, place, & time)       Respiratory: No evidence of acute respiratory distress Eyes: PERLA Vitals: BP 119/77   Pulse (!) 105   Temp 97.7 F (36.5 C)   Resp 16   Ht '5\' 3"'  (1.6 m)   Wt 235 lb (106.6 kg)   SpO2 97%   BMI 41.63 kg/m  BMI: Estimated  body mass index is 41.63 kg/m as calculated from the following:   Height as of this encounter: '5\' 3"'  (1.6 m).   Weight as of this encounter: 235 lb (106.6 kg). Ideal: Ideal body weight: 52.4 kg (115 lb 8.3 oz) Adjusted ideal body weight: 74.1 kg (163 lb 5 oz)  Assessment   Status Diagnosis  Controlled Controlled Controlled 1. Chronic pain syndrome   2. Chronic low back pain (Primary Area of Pain) (Bilateral) (L>R)   3. Chronic lower extremity pain (Secondary area of Pain) (Bilateral) (R>L)   4. Pharmacologic therapy      Updated Problems: No problems updated.  Plan of Care  Problem-specific:  No problem-specific Assessment & Plan notes found for this encounter.  Ms. MARJA ADDERLEY has a current medication list which includes the following long-term medication(s): apixaban, atorvastatin, duloxetine, humalog kwikpen, levothyroxine, [START ON 10/01/2019] oxycodone, [START ON 10/31/2019] oxycodone, and [START ON 11/30/2019] oxycodone.  Pharmacotherapy (Medications Ordered): Meds ordered this encounter  Medications  . oxyCODONE (OXY IR/ROXICODONE) 5 MG immediate release tablet    Sig: Take 1 tablet (5 mg total) by mouth every 8 (eight) hours as needed for severe pain. Must last 30 days    Dispense:  90 tablet    Refill:  0    Chronic Pain: STOP Act (Not applicable) Fill 1 day early if closed on refill date. Do not fill until: 10/01/2019. To last until: 10/31/2019. Avoid benzodiazepines within 8 hours of opioids  . oxyCODONE (OXY IR/ROXICODONE) 5 MG immediate release tablet    Sig: Take 1 tablet (5 mg total) by mouth every 8 (eight) hours as needed for severe pain.  Must last 30 days    Dispense:  90 tablet    Refill:  0    Chronic Pain: STOP Act (Not applicable) Fill 1 day early if closed on refill date. Do not fill until: 10/31/2019. To last until: 11/30/2019. Avoid benzodiazepines within 8 hours of opioids  . oxyCODONE (OXY IR/ROXICODONE) 5 MG immediate release tablet    Sig: Take 1 tablet (5 mg total) by mouth every 8 (eight) hours as needed for severe pain. Must last 30 days    Dispense:  90 tablet    Refill:  0    Chronic Pain: STOP Act (Not applicable) Fill 1 day early if closed on refill date. Do not fill until: 11/30/2019. To last until: 12/30/2019. Avoid benzodiazepines within 8 hours of opioids   Orders:  No orders of the defined types were placed in this encounter.  Follow-up plan:   Return in about 3 months (around 12/28/2019) for (20-min), (F2F), (Med Mgmt), on E/M day with Dr. Gillis Santa..      Interventional treatment options: Under consideration:   NOTE: Currently on dialysis. Diagnostic bilateral lumbar facet block  Possible bilateral lumbar facet RFA.  Diagnostic bilateral celiac plexus block  Diagnostic bilateral IA hip injection  Diagnostic bilateral femoral nerve and obturator NB  Possible bilateral femoral nerve and obturator nerve RFA.  Diagnostic bilateral IA knee joint injection  Possible series of 5 bilateral IA Hyalgan knee injections.  Diagnostic bilateral genicular NB  Possible bilateral genicular nerve RFA    Palliative PRN treatment(s):   None at this time.       Recent Visits Date Type Provider Dept  09/29/19 Office Visit Milinda Pointer, MD [facility]  Showing recent visits within past 90 days and meeting all other requirements Future Appointments Date Type Provider Dept  12/28/19 Appointment Gillis Santa, MD [facility]  Showing future appointments within next 90 days and meeting all other requirements  I discussed the assessment and treatment plan with the patient. The patient  was provided an opportunity to ask questions and all were answered. The patient agreed with the plan and demonstrated an understanding of the instructions.  Patient advised to call back or seek an in-person evaluation if the symptoms or condition worsens.  Duration of encounter: 30 minutes.  Note by: Gaspar Cola, MD Date: 09/29/2019; Time: 12:19 PM

## 2019-09-29 NOTE — Progress Notes (Signed)
Nursing Pain Medication Assessment:  Safety precautions to be maintained throughout the outpatient stay will include: orient to surroundings, keep bed in low position, maintain call bell within reach at all times, provide assistance with transfer out of bed and ambulation.  Medication Inspection Compliance: Pill count conducted under aseptic conditions, in front of the patient. Neither the pills nor the bottle was removed from the patient's sight at any time. Once count was completed pills were immediately returned to the patient in their original bottle.  Medication: Oxycodone ER (OxyContin) Pill/Patch Count: 62 of 90 pills remain Pill/Patch Appearance: Markings consistent with prescribed medication Bottle Appearance: Standard pharmacy container. Clearly labeled. Filled Date: 7 / 17 / 2021 Last Medication intake:  Yesterday   Patient brought medication that was given to her Hydrocodone 5/325 mg 19 pills filled 01/19/20 [facility]. Prescription # [id number] Prescriber G Schnier Wasted by myself and sbrown

## 2019-09-29 NOTE — Patient Instructions (Signed)
____________________________________________________________________________________________  Drug Holidays (Slow)  What is a "Drug Holiday"? Drug Holiday: is the name given to the period of time during which a patient stops taking a medication(s) for the purpose of eliminating tolerance to the drug.  Benefits . Improved effectiveness of opioids. . Decreased opioid dose needed to achieve benefits. . Improved pain with lesser dose.  What is tolerance? Tolerance: is the progressive decreased in effectiveness of a drug due to its repetitive use. With repetitive use, the body gets use to the medication and as a consequence, it loses its effectiveness. This is a common problem seen with opioid pain medications. As a result, a larger dose of the drug is needed to achieve the same effect that used to be obtained with a smaller dose.  How long should a "Drug Holiday" last? You should stay off of the pain medicine for at least 14 consecutive days. (2 weeks)  Should I stop the medicine "cold turkey"? No. You should always coordinate with your Pain Specialist so that he/she can provide you with the correct medication dose to make the transition as smoothly as possible.  How do I stop the medicine? Slowly. You will be instructed to decrease the daily amount of pills that you take by one (1) pill every seven (7) days. This is called a "slow downward taper" of your dose. For example: if you normally take four (4) pills per day, you will be asked to drop this dose to three (3) pills per day for seven (7) days, then to two (2) pills per day for seven (7) days, then to one (1) per day for seven (7) days, and at the end of those last seven (7) days, this is when the "Drug Holiday" would start.   Will I have withdrawals? By doing a "slow downward taper" like this one, it is unlikely that you will experience any significant withdrawal symptoms. Typically, what triggers withdrawals is the sudden stop of a high  dose opioid therapy. Withdrawals can usually be avoided by slowly decreasing the dose over a prolonged period of time. If you do not follow these instructions and decide to stop your medication abruptly, withdrawals may be possible.  What are withdrawals? Withdrawals: refers to the wide range of symptoms that occur after stopping or dramatically reducing opiate drugs after heavy and prolonged use. Withdrawal symptoms do not occur to patients that use low dose opioids, or those who take the medication sporadically. Contrary to benzodiazepine (example: Valium, Xanax, etc.) or alcohol withdrawals ("Delirium Tremens"), opioid withdrawals are not lethal. Withdrawals are the physical manifestation of the body getting rid of the excess receptors.  Expected Symptoms Early symptoms of withdrawal may include: . Agitation . Anxiety . Muscle aches . Increased tearing . Insomnia . Runny nose . Sweating . Yawning  Late symptoms of withdrawal may include: . Abdominal cramping . Diarrhea . Dilated pupils . Goose bumps . Nausea . Vomiting  Will I experience withdrawals? Due to the slow nature of the taper, it is very unlikely that you will experience any.  What is a slow taper? Taper: refers to the gradual decrease in dose.  (Last update: 09/08/2019) ____________________________________________________________________________________________    ____________________________________________________________________________________________  Medication Rules  Purpose: To inform patients, and their family members, of our rules and regulations.  Applies to: All patients receiving prescriptions (written or electronic).  Pharmacy of record: Pharmacy where electronic prescriptions will be sent. If written prescriptions are taken to a different pharmacy, please inform the nursing staff. The pharmacy   listed in the electronic medical record should be the one where you would like electronic prescriptions  to be sent.  Electronic prescriptions: In compliance with the [place] Strengthen Opioid Misuse Prevention (STOP) Act of 2017 (Session Law 2017-74/H243), effective February 18, 2018, all controlled substances must be electronically prescribed. Calling prescriptions to the pharmacy will cease to exist.  Prescription refills: Only during scheduled appointments. Applies to all prescriptions.  NOTE: The following applies primarily to controlled substances (Opioid* Pain Medications).   Type of encounter (visit): For patients receiving controlled substances, face-to-face visits are required. (Not an option or up to the patient.)  Patient's responsibilities: 1. Pain Pills: Bring all pain pills to every appointment (except for procedure appointments). 2. Pill Bottles: Bring pills in original pharmacy bottle. Always bring the newest bottle. Bring bottle, even if empty. 3. Medication refills: You are responsible for knowing and keeping track of what medications you take and those you need refilled. The day before your appointment: write a list of all prescriptions that need to be refilled. The day of the appointment: give the list to the admitting nurse. Prescriptions will be written only during appointments. No prescriptions will be written on procedure days. If you forget a medication: it will not be "Called in", "Faxed", or "electronically sent". You will need to get another appointment to get these prescribed. No early refills. Do not call asking to have your prescription filled early. 4. Prescription Accuracy: You are responsible for carefully inspecting your prescriptions before leaving our office. Have the discharge nurse carefully go over each prescription with you, before taking them home. Make sure that your name is accurately spelled, that your address is correct. Check the name and dose of your medication to make sure it is accurate. Check the number of pills, and the written instructions to  make sure they are clear and accurate. Make sure that you are given enough medication to last until your next medication refill appointment. 5. Taking Medication: Take medication as prescribed. When it comes to controlled substances, taking less pills or less frequently than prescribed is permitted and encouraged. Never take more pills than instructed. Never take medication more frequently than prescribed.  6. Inform other Doctors: Always inform, all of your healthcare providers, of all the medications you take. 7. Pain Medication from other Providers: You are not allowed to accept any additional pain medication from any other Doctor or Healthcare provider. There are two exceptions to this rule. (see below) In the event that you require additional pain medication, you are responsible for notifying us, as stated below. 8. Medication Agreement: You are responsible for carefully reading and following our Medication Agreement. This must be signed before receiving any prescriptions from our practice. Safely store a copy of your signed Agreement. Violations to the Agreement will result in no further prescriptions. (Additional copies of our Medication Agreement are available upon request.) 9. Laws, Rules, & Regulations: All patients are expected to follow all Federal and State Laws, Statutes, Rules, & Regulations. Ignorance of the Laws does not constitute a valid excuse.  10. Illegal drugs and Controlled Substances: The use of illegal substances (including, but not limited to marijuana and its derivatives) and/or the illegal use of any controlled substances is strictly prohibited. Violation of this rule may result in the immediate and permanent discontinuation of any and all prescriptions being written by our practice. The use of any illegal substances is prohibited. 11. Adopted CDC guidelines & recommendations: Target dosing levels will be at or   below 60 MME/day. Use of benzodiazepines** is not  recommended.  Exceptions: There are only two exceptions to the rule of not receiving pain medications from other Healthcare Providers. 1. Exception #1 (Emergencies): In the event of an emergency (i.e.: accident requiring emergency care), you are allowed to receive additional pain medication. However, you are responsible for: As soon as you are able, call our office [phone], at any time of the day or night, and leave a message stating your name, the date and nature of the emergency, and the name and dose of the medication prescribed. In the event that your call is answered by a member of our staff, make sure to document and save the date, time, and the name of the person that took your information.  2. Exception #2 (Planned Surgery): In the event that you are scheduled by another doctor or dentist to have any type of surgery or procedure, you are allowed (for a period no longer than 30 days), to receive additional pain medication, for the acute post-op pain. However, in this case, you are responsible for picking up a copy of our "Post-op Pain Management for Surgeons" handout, and giving it to your surgeon or dentist. This document is available at our office, and does not require an appointment to obtain it. Simply go to our office during business hours (Monday-Thursday from 8:00 AM to 4:00 PM) (Friday 8:00 AM to 12:00 Noon) or if you have a scheduled appointment with us, prior to your surgery, and ask for it by name. In addition, you will need to provide us with your name, name of your surgeon, type of surgery, and date of procedure or surgery.  *Opioid medications include: morphine, codeine, oxycodone, oxymorphone, hydrocodone, hydromorphone, meperidine, tramadol, tapentadol, buprenorphine, fentanyl, methadone. **Benzodiazepine medications include: diazepam (Valium), alprazolam (Xanax), clonazepam (Klonopine), lorazepam (Ativan), clorazepate (Tranxene), chlordiazepoxide (Librium), estazolam (Prosom),  oxazepam (Serax), temazepam (Restoril), triazolam (Halcion) (Last updated: 04/17/2017) ____________________________________________________________________________________________   ____________________________________________________________________________________________  Medication Recommendations and Reminders  Applies to: All patients receiving prescriptions (written and/or electronic).  Medication Rules & Regulations: These rules and regulations exist for your safety and that of others. They are not flexible and neither are we. Dismissing or ignoring them will be considered "non-compliance" with medication therapy, resulting in complete and irreversible termination of such therapy. (See document titled "Medication Rules" for more details.) In all conscience, because of safety reasons, we cannot continue providing a therapy where the patient does not follow instructions.  Pharmacy of record:   Definition: This is the pharmacy where your electronic prescriptions will be sent.   We do not endorse any particular pharmacy, however, we have experienced problems with [facility] not securing enough medication supply for the community.  We do not restrict you in your choice of pharmacy. However, once we write for your prescriptions, we will NOT be re-sending more prescriptions to fix restricted supply problems created by your pharmacy, or your insurance.   The pharmacy listed in the electronic medical record should be the one where you want electronic prescriptions to be sent.  If you choose to change pharmacy, simply notify our nursing staff.  Recommendations:  Keep all of your pain medications in a safe place, under lock and key, even if you live alone. We will NOT replace lost, stolen, or damaged medication.  After you fill your prescription, take 1 week's worth of pills and put them away in a safe place. You should keep a separate, properly labeled bottle for this purpose. The remainder    should be kept in the original bottle. Use this as your primary supply, until it runs out. Once it's gone, then you know that you have 1 week's worth of medicine, and it is time to come in for a prescription refill. If you do this correctly, it is unlikely that you will ever run out of medicine.  To make sure that the above recommendation works, it is very important that you make sure your medication refill appointments are scheduled at least 1 week before you run out of medicine. To do this in an effective manner, make sure that you do not leave the office without scheduling your next medication management appointment. Always ask the nursing staff to show you in your prescription , when your medication will be running out. Then arrange for the receptionist to get you a return appointment, at least 7 days before you run out of medicine. Do not wait until you have 1 or 2 pills left, to come in. This is very poor planning and does not take into consideration that we may need to cancel appointments due to bad weather, sickness, or emergencies affecting our staff.  DO NOT ACCEPT A "Partial Fill": If for any reason your pharmacy does not have enough pills/tablets to completely fill or refill your prescription, do not allow for a "partial fill". The law allows the pharmacy to complete that prescription within 72 hours, without requiring a new prescription. If they do not fill the rest of your prescription within those 72 hours, you will need a separate prescription to fill the remaining amount, which we will NOT provide. If the reason for the partial fill is your insurance, you will need to talk to the pharmacist about payment alternatives for the remaining tablets, but again, DO NOT ACCEPT A PARTIAL FILL, unless you can trust your pharmacist to obtain the remainder of the pills within 72 hours.  Prescription refills and/or changes in medication(s):   Prescription refills, and/or changes in dose or medication,  will be conducted only during scheduled medication management appointments. (Applies to both, written and electronic prescriptions.)  No refills on procedure days. No medication will be changed or started on procedure days. No changes, adjustments, and/or refills will be conducted on a procedure day. Doing so will interfere with the diagnostic portion of the procedure.  No phone refills. No medications will be "called into the pharmacy".  No Fax refills.  No weekend refills.  No Holliday refills.  No after hours refills.  Remember:  Business hours are:  Monday to Thursday 8:00 AM to 4:00 PM Provider's Schedule: Hellon Vaccarella, MD - Appointments are:  Medication management: Monday and Wednesday 8:00 AM to 4:00 PM Procedure day: Tuesday and Thursday 7:30 AM to 4:00 PM Bilal Lateef, MD - Appointments are:  Medication management: Tuesday and Thursday 8:00 AM to 4:00 PM Procedure day: Monday and Wednesday 7:30 AM to 4:00 PM (Last update: 09/08/2019) ____________________________________________________________________________________________   ____________________________________________________________________________________________  CBD (cannabidiol) WARNING  Applicable to: All individuals currently taking or considering taking CBD (cannabidiol) and, more important, all patients taking opioid analgesic controlled substances (pain medication). (Example: oxycodone; oxymorphone; hydrocodone; hydromorphone; morphine; methadone; tramadol; tapentadol; fentanyl; buprenorphine; butorphanol; dextromethorphan; meperidine; codeine; etc.)  Legal status: CBD remains a Schedule I drug prohibited for any use. CBD is illegal with one exception. In the [place], CBD has a limited Food and Drug Administration (FDA) approval for the treatment of two specific types of epilepsy disorders. Only one CBD product has been approved by the   FDA for this purpose: "Epidiolex". FDA is aware that some  companies are marketing products containing cannabis and cannabis-derived compounds in ways that violate the [employer], Drug and Cosmetic Act [facility] Act) and that may put the health and safety of consumers at risk. The FDA, a Federal agency, has not enforced the CBD status since 2018.   Legality: Some manufacturers ship CBD products nationally, which is illegal. Often such products are sold online and are therefore available throughout the country. CBD is openly sold in head shops and health food stores in some states where such sales have not been explicitly legalized. Selling unapproved products with unsubstantiated therapeutic claims is not only a violation of the law, but also can put patients at risk, as these products have not been proven to be safe or effective. Federal illegality makes it difficult to conduct research on CBD.  Reference: "FDA Regulation of Cannabis and Cannabis-Derived Products, Including Cannabidiol (CBD)" - [employer]  Warning: CBD is not FDA approved and has not undergo the same manufacturing controls as prescription drugs.  This means that the purity and safety of available CBD may be questionable. Most of the time, despite manufacturer's claims, it is contaminated with THC (delta-9-tetrahydrocannabinol - the chemical in marijuana responsible for the "HIGH").  When this is the case, the [facility] contaminant will trigger a positive urine drug screen (UDS) test for Marijuana (carboxy-THC). Because a positive UDS for any illicit substance is a violation of our medication agreement, your opioid analgesics (pain medicine) may be permanently discontinued.  MORE ABOUT CBD  General Information: CBD  is a derivative of the Marijuana (cannabis sativa) plant discovered in 70. It is one of the 113 identified substances found in Marijuana. It accounts for up to 40% of the  plant's extract. As of 2018, preliminary clinical studies on CBD included research for the treatment of anxiety, movement disorders, and pain. CBD is available and consumed in multiple forms, including inhalation of smoke or vapor, as an aerosol spray, and by mouth. It may be supplied as an oil containing CBD, capsules, dried cannabis, or as a liquid solution. CBD is thought not to be as psychoactive as THC (delta-9-tetrahydrocannabinol - the chemical in marijuana responsible for the "HIGH"). Studies suggest that CBD may interact with different biological target receptors in the body, including cannabinoid and other neurotransmitter receptors. As of 2018 the mechanism of action for its biological effects has not been determined.  Side-effects  Adverse reactions: Dry mouth, diarrhea, decreased appetite, fatigue, drowsiness, malaise, weakness, sleep disturbances, and others.  Drug interactions: CBC may interact with other medications such as blood-thinners. (Last update: 09/25/2019) ____________________________________________________________________________________________   ____________________________________________________________________________________________  Medication Transfer   Notification You are currently compliant and stable on your pain medication regimen. This regimen will be transferred today to your Primary Care Provider (PCP). You will be provided with enough prescriptions to last for 90 days. After that, your prescriptions will need to be taken over by your PCP.  Recommendation Immediately contact your primary care provider to secure an appointment for evaluation before this period is over. Do not wait until the last month to contact them.   Clarification The transfer of your medication regimen does not mean that you are being discharged from our clinic. We will remain available to you for any consultation or interventional therapies you may need.   Alternative Should you  decide not to continue taking these medication and would like assistance in permanently stopping them, please let us know so that we  can design a slow tapering down of your regimen.  Reason Our primary responsibility to provide specialized interventional pain management therapies otherwise not available to the community. We have in the past assisted primary care providers with reviewing and adjusting pain medication management therapies, however, we have been transparent to all patients and referring providers that it is not our intention to permanently take over this type of therapy. Transfer of this portion of your care will assist [place] in freeing time to assist others in need of our specialty services.   ____________________________________________________________________________________________

## 2019-10-04 ENCOUNTER — Telehealth: Payer: Self-pay | Admitting: *Deleted

## 2019-10-04 ENCOUNTER — Other Ambulatory Visit: Payer: Self-pay | Admitting: Pain Medicine

## 2019-10-04 DIAGNOSIS — G894 Chronic pain syndrome: Secondary | ICD-10-CM

## 2019-10-04 MED ORDER — BUPRENORPHINE 10 MCG/HR TD PTWK: 1.0000 | MEDICATED_PATCH | TRANSDERMAL | 0 refills | Status: DC

## 2019-10-04 NOTE — Progress Notes (Signed)
[facility] did not have her ox[facility]codone and therefore we had to switch her medication to the Butrans patches (10 mcg/h)

## 2019-10-04 NOTE — Telephone Encounter (Signed)
Called patient back and spoke with daughter, Ellorie Kindall.  Patient was in last week on 09/29/19 and because she is taking oxycodone 10 mg discussion was had that there is currently a shortage and patient may need to be switched to a different medication.  I told Amber that since they have already discussed at last visit, I would send to Dr Holley Raring and let them know his response.

## 2019-10-04 NOTE — Telephone Encounter (Signed)
Called [facility] and cancel all of the oxycodone prescriptions. I sent prescriptions for Butrans patches. Call patient, let her know, and inform her that she needs to put the patch on and keep it on for 7 days before changing it. Given the usual instructions for the use of the Butrans.  Received this message in a bubble from Dr Dossie Arbour.  Attempted to reach patient to give her this information.  Reached Amber and let her know Dr Dossie Arbour has sent in for [facility] patches.  Amber does not think that the patches will do well for her mom.  States that she has multiple skin problems and many Doctor appts that would be a hindrance to the patches.  I told her that I would send Dr Dossie Arbour a message and let him know her concerns.  Sorry, this is not a supermarket. She is currently looking at patch or no medicine. I did not create the shortage.  Information conveyed to patient's daughter.  I asked her to just try the patches and see how they do.  If they just do not work for her mother to please call and we would get her set up with an appt with Dr Holley Raring.    [facility]ed [facility] and cancelled oxycodone 5 mg x 3 months August, September and October and pharmacy reports they can get butrans patches tomorrow in order.

## 2019-10-11 ENCOUNTER — Ambulatory Visit (INDEPENDENT_AMBULATORY_CARE_PROVIDER_SITE_OTHER): Payer: Medicare Other | Admitting: Vascular Surgery

## 2019-10-11 ENCOUNTER — Encounter (INDEPENDENT_AMBULATORY_CARE_PROVIDER_SITE_OTHER): Payer: Self-pay | Admitting: Vascular Surgery

## 2019-10-11 ENCOUNTER — Ambulatory Visit (INDEPENDENT_AMBULATORY_CARE_PROVIDER_SITE_OTHER): Payer: Medicare Other

## 2019-10-11 ENCOUNTER — Other Ambulatory Visit: Payer: Self-pay

## 2019-10-11 VITALS — BP 144/72 | HR 108 | Ht 63.0 in | Wt 230.0 lb

## 2019-10-11 DIAGNOSIS — N186 End stage renal disease: Secondary | ICD-10-CM

## 2019-10-11 DIAGNOSIS — I10 Essential (primary) hypertension: Secondary | ICD-10-CM

## 2019-10-11 DIAGNOSIS — T829XXS Unspecified complication of cardiac and vascular prosthetic device, implant and graft, sequela: Secondary | ICD-10-CM

## 2019-10-11 DIAGNOSIS — I480 Paroxysmal atrial fibrillation: Secondary | ICD-10-CM

## 2019-10-12 ENCOUNTER — Encounter (INDEPENDENT_AMBULATORY_CARE_PROVIDER_SITE_OTHER): Payer: Self-pay | Admitting: Vascular Surgery

## 2019-10-12 DIAGNOSIS — T829XXA Unspecified complication of cardiac and vascular prosthetic device, implant and graft, initial encounter: Secondary | ICD-10-CM | POA: Insufficient documentation

## 2019-10-12 NOTE — Progress Notes (Signed)
MRN : [id number]  [name] is a 69 y.o. ([birth year]) female who presents with chief complaint of  Chief Complaint  Patient presents with  . Follow-up    58mo U/S   .  History of Present Illness:   The patient returns to the office for followup of their dialysis access. The patient denies hand pain or other symptoms consistent with steal phenomena.  No significant arm swelling.  The patient denies redness or swelling at the access site. The patient denies fever or chills at home or while on dialysis.  The patient denies amaurosis fugax or recent TIA symptoms. There are no recent neurological changes noted. The patient denies claudication symptoms or rest pain symptoms. The patient denies history of DVT, PE or superficial thrombophlebitis. The patient denies recent episodes of angina or shortness of breath.   Duplex ultrasound of the AV access shows a patent access.  The previously noted stenosis is unchanged compared to last study.    Current Meds  Medication Sig  . amiodarone (PACERONE) 200 MG tablet Take 200 mg by mouth daily.   Marland Kitchen apixaban (ELIQUIS) 5 MG TABS tablet Take 5 mg by mouth 2 (two) times daily.   Marland Kitchen atorvastatin (LIPITOR) 40 MG tablet Take 40 mg by mouth daily.   . buprenorphine (BUTRANS) 10 MCG/HR PTWK Place 1 patch onto the skin once a week for 28 days.  Derrill Memo ON 11/01/2019] buprenorphine (BUTRANS) 10 MCG/HR PTWK Place 1 patch onto the skin once a week for 28 days.  Derrill Memo ON 11/29/2019] buprenorphine (BUTRANS) 10 MCG/HR PTWK Place 1 patch onto the skin once a week for 28 days.  . DULoxetine (CYMBALTA) 30 MG capsule Take 30 mg by mouth daily.   . DUPIXENT 300 MG/2ML SOSY Inject 300 mg into the skin every 14 (fourteen) days.  Marland Kitchen HUMALOG KWIKPEN 100 UNIT/ML KwikPen Inject 0.22-0.38 mLs (22-38 Units total) into the skin See admin instructions. Inject 22u under the skin before breakfast and inject 38u under the skin before supper  . insulin glargine (LANTUS)  100 unit/mL SOPN Inject 45 Units into the skin at bedtime.   Marland Kitchen levothyroxine (SYNTHROID, LEVOTHROID) 88 MCG tablet Take 88 mcg by mouth daily before breakfast.   . oxyCODONE (OXY IR/ROXICODONE) 5 MG immediate release tablet Take 1 tablet (5 mg total) by mouth every 8 (eight) hours as needed for severe pain. Must last 30 days  . [START ON 10/31/2019] oxyCODONE (OXY IR/ROXICODONE) 5 MG immediate release tablet Take 1 tablet (5 mg total) by mouth every 8 (eight) hours as needed for severe pain. Must last 30 days  . [START ON 11/30/2019] oxyCODONE (OXY IR/ROXICODONE) 5 MG immediate release tablet Take 1 tablet (5 mg total) by mouth every 8 (eight) hours as needed for severe pain. Must last 30 days  . polyethylene glycol (MIRALAX / GLYCOLAX) 17 g packet Take 17 g by mouth daily as needed for moderate constipation.  . torsemide (DEMADEX) 20 MG tablet Take 40 mg by mouth daily.   . Vitamin D, Ergocalciferol, (DRISDOL) 1.25 MG (50000 UT) CAPS capsule Take 50,000 Units by mouth every Tuesday.    Past Medical History:  Diagnosis Date  . Anemia   . Anxiety   . Aortic valve stenosis   . Arrhythmia    atrial fibrillation  . Arthritis    feet, legs  . B12 deficiency   . Bowel obstruction ([place])   . CHF (congestive heart failure) ([place])   . CKD (chronic kidney  disease)    protein in urine  . Colostomy in place [facility])   . Diabetes mellitus without complication ([place])   . Dialysis patient [facility])    T/Th/Sa  . Diverticulitis large intestine   . Dysrhythmia   . Ectopic atrial tachycardia ([place])   . FSGS (focal segmental glomerulosclerosis)   . Gastritis   . GI bleed   . Heart murmur   . Hyperlipidemia   . Hypertension   . Hypothyroidism   . Hypothyroidism    unspecified  . MRSA (methicillin resistant Staphylococcus aureus)    at abdominal wound.  Jan 2017.  Treated.   . Neuropathy   . Neuropathy in diabetes ([place])   . Obesity   . Pedal edema   . Psoriasis   . Shortness of breath dyspnea   .  Sleep apnea    CPAP  . Vertigo     Past Surgical History:  Procedure Laterality Date  . ABDOMINAL HYSTERECTOMY    . APPLICATION OF WOUND VAC N/A 02/24/2015   Procedure: APPLICATION OF WOUND VAC;  Surgeon: Clayburn Pert, MD;  Location: [facility] ORS;  Service: General;  Laterality: N/A;  . APPLICATION OF WOUND VAC N/A 02/26/2015   Procedure: APPLICATION OF WOUND VAC;  Surgeon: Clayburn Pert, MD;  Location: [facility];  Service: General;  Laterality: N/A;  . AV FISTULA PLACEMENT Left 01/29/2019   Procedure: ARTERIOVENOUS (AV) FISTULA CREATION ( BRACHIAL CEPHALIC);  Surgeon: Katha Cabal, MD;  Location: [facility];  Service: Vascular;  Laterality: Left;  . BACK SURGERY     spur frmoved from lower back  . CARDIAC CATHETERIZATION    . CARDIAC VALVE REPLACEMENT  08/2017   [facility]  . CARDIOVERSION N/A 07/15/2018   Procedure: CARDIOVERSION;  Surgeon: Corey Skains, MD;  Location: [facility];  Service: Cardiovascular;  Laterality: N/A;  . CATARACT EXTRACTION W/PHACO Right 11/16/2018   Procedure: CATARACT EXTRACTION PHACO AND INTRAOCULAR LENS PLACEMENT (IOC) RIGHT DIABETiC  01:18.3  13.5%  10.82;  Surgeon: Eulogio Bear, MD;  Location: [place];  Service: Ophthalmology;  Laterality: Right;  Diabetic - insulin sleep apnea  . CATARACT EXTRACTION W/PHACO Left 12/07/2018   Procedure: CATARACT EXTRACTION PHACO AND INTRAOCULAR LENS PLACEMENT (IOC) LEFT DIABETIC 01:02.6  8.8%  5.77;  Surgeon: Eulogio Bear, MD;  Location: [place];  Service: Ophthalmology;  Laterality: Left;  Diabetic - insulin  . CHOLECYSTECTOMY    . COLECTOMY    . COLONOSCOPY WITH PROPOFOL N/A 09/04/2016   Procedure: COLONOSCOPY WITH PROPOFOL;  Surgeon: Manya Silvas, MD;  Location[facility] ENDOSCOPY;  Service: Endoscopy;  Laterality: N/A;  . COLOSTOMY REVERSAL N/A 07/12/2015   Procedure: COLOSTOMY REVERSAL;  Surgeon: Clayburn Pert, MD;  Location: [facility];  Service: General;  Laterality: N/A;  .  DEBRIDEMENT OF ABDOMINAL WALL ABSCESS N/A 02/22/2015   Procedure: DEBRIDEMENT OF ABDOMINAL WALL ABSCESS;  Surgeon: Clayburn Pert, MD;  Location: [facility];  Service: General;  Laterality: N/A;  . DIALYSIS/PERMA CATHETER INSERTION N/A 10/20/2018   Procedure: DIALYSIS/PERMA CATHETER INSERTION;  Surgeon: Katha Cabal, MD;  Location: [place] CV [facility];  Service: Cardiovascular;  Laterality: N/A;  . DIALYSIS/PERMA CATHETER REMOVAL N/A 08/03/2019   Procedure: DIALYSIS/PERMA CATHETER REMOVAL;  Surgeon: Katha Cabal, MD;  Location: [place] [facility];  Service: Cardiovascular;  Laterality: N/A;  . EXCISION MASS ABDOMINAL N/A 02/24/2015   Procedure: EXCISION MASS ABDOMINAL  / [place];  Surgeon: Clayburn Pert, MD;  Location: [facility];  Service: General;  Laterality:  N/A;  . EXCISION MASS ABDOMINAL N/A 02/26/2015   Procedure: EXCISION MASS ABDOMINAL/wash out;  Surgeon: Clayburn Pert, MD;  Location: [facility];  Service: General;  Laterality: N/A;  . FLEXIBLE SIGMOIDOSCOPY  06/19/2015   Procedure: FLEXIBLE SIGMOIDOSCOPY;  Surgeon: Lucilla Lame, MD;  Location: [place];  Service: Endoscopy;;  UNABLE TO ACCESS OSTOMY SITE FOR ACCESS INTO COLON  . HERNIA REPAIR     umbilical  . LAPAROSCOPY  07/12/2015   Procedure: LAPAROSCOPY DIAGNOSTIC;  Surgeon: Clayburn Pert, MD;  Location: [facility];  Service: General;;  . LAPAROTOMY  07/12/2015   Procedure: EXPLORATORY LAPAROTOMY;  Surgeon: Clayburn Pert, MD;  Location: [facility];  Service: General;;  . LAPAROTOMY N/A 02/06/2015   Procedure: Laparotomy, reduction of incarcerated parastomal hernia, repair of parastomal hernia with mesh;  Surgeon: Sherri Rad, MD;  Location: [facility];  Service: General;  Laterality: N/A;  . laparotomy closure of cecal perforation  05/09/2013   Dr. Marina Gravel  . LYSIS OF ADHESION  07/12/2015   Procedure: LYSIS OF ADHESION;  Surgeon: Clayburn Pert, MD;  Location: [facility];  Service: General;;  . TONSILLECTOMY    . WOUND  DEBRIDEMENT N/A 03/03/2015   Procedure: DEBRIDEMENT ABDOMINAL WOUND;  Surgeon: Florene Glen, MD;  Location: [facility];  Service: General;  Laterality: N/A;  . WOUND DEBRIDEMENT N/A 03/09/2015   Procedure: DEBRIDEMENT ABDOMINAL WOUND;  Surgeon: Florene Glen, MD;  Location: [facility];  Service: General;  Laterality: N/A;    Social History Social History   Tobacco Use  . Smoking status: Former Smoker    Types: Cigarettes    Quit date: 04/18/2013    Years since quitting: 6.4  . Smokeless tobacco: Never Used  Vaping Use  . Vaping Use: Never used  Substance Use Topics  . Alcohol use: No    Alcohol/week: 0.0 standard drinks  . Drug use: No    Family History Family History  Problem Relation Age of Onset  . Diabetes Mother   . Hypertension Father   . CAD Father   . Heart attack Father   . Colon cancer Brother     Allergies  Allergen Reactions  . Buspirone Other (See Comments)    Weakness  . Diltiazem Hcl Palpitations  . Gabapentin Palpitations  . Hydralazine Rash  . Lisinopril Cough  . Lovastatin Palpitations  . Metformin Diarrhea  . Pravastatin Other (See Comments)    Insomnia  . Sitagliptin Other (See Comments)    Constipation  . Tramadol Itching     REVIEW OF SYSTEMS (Negative unless checked)  Constitutional: [] Weight loss  [] Fever  [] Chills Cardiac: [] Chest pain   [] Chest pressure   [] Palpitations   [] Shortness of breath when laying flat   [] Shortness of breath with exertion. Vascular:  [] Pain in legs with walking   [] Pain in legs at rest  [] History of DVT   [] Phlebitis   [] Swelling in legs   [] Varicose veins   [] Non-healing ulcers Pulmonary:   [] Uses home oxygen   [] Productive cough   [] Hemoptysis   [] Wheeze  [] COPD   [] Asthma Neurologic:  [] Dizziness   [] Seizures   [] History of stroke   [] History of TIA  [] Aphasia   [] Vissual changes   [] Weakness or numbness in arm   [] Weakness or numbness in leg Musculoskeletal:   [] Joint swelling   [] Joint pain   [] Low back  pain Hematologic:  [] Easy bruising  [] Easy bleeding   [] Hypercoagulable state   [] Anemic Gastrointestinal:  [] Diarrhea   [] Vomiting  [] Gastroesophageal  reflux/heartburn   [] Difficulty swallowing. Genitourinary:  [x] Chronic kidney disease   [] Difficult urination  [] Frequent urination   [] Blood in urine Skin:  [] Rashes   [] Ulcers  Psychological:  [] History of anxiety   []  History of major depression.  Physical Examination  Vitals:   10/11/19 1340  BP: (!) 144/72  Pulse: (!) 108  Weight: 230 lb (104.3 kg)  Height: 5\' 3"  (1.6 m)   Body mass index is 40.74 kg/m. Gen: WD/WN, NAD Head: [place]/AT, No temporalis wasting.  Ear/Nose/Throat: Hearing grossly intact, nares w/o erythema or drainage Eyes: PER, EOMI, sclera nonicteric.  Neck: Supple, no large masses.   Pulmonary:  Good air movement, no audible wheezing bilaterally, no use of accessory muscles.  Cardiac: RRR, no JVD Vascular: good thrill good bruit Vessel Right Left  Radial Palpable Palpable  Brachial Palpable Palpable  Gastrointestinal: Non-distended. No guarding/no peritoneal signs.  Musculoskeletal: M/S 5/5 throughout.  No deformity or atrophy.  Neurologic: CN 2-12 intact. Symmetrical.  Speech is fluent. Motor exam as listed above. Psychiatric: Judgment intact, Mood & affect appropriate for pt's clinical situation. Dermatologic: No rashes or ulcers noted.  No changes consistent with cellulitis.   CBC Lab Results  Component Value Date   WBC 9.2 09/08/2019   HGB 10.9 (L) 09/08/2019   HCT 35.8 (L) 09/08/2019   MCV 92.3 09/08/2019   PLT 215 09/08/2019    BMET    Component Value Date/Time   NA 137 09/10/2019 0517   NA 143 07/19/2013 0523   K 4.3 09/10/2019 0517   K 3.7 07/19/2013 0523   CL 96 (L) 09/10/2019 0517   CL 109 (H) 07/19/2013 0523   CO2 32 09/10/2019 0517   CO2 29 07/19/2013 0523   GLUCOSE 161 (H) 09/10/2019 0517   GLUCOSE 110 (H) 07/19/2013 0523   BUN 37 (H) 09/10/2019 0517   BUN 8 07/19/2013 0523    CREATININE 4.27 (H) 09/10/2019 0517   CREATININE 1.58 (H) 07/19/2013 0523   CALCIUM 8.3 (L) 09/10/2019 0517   CALCIUM 7.8 (L) 07/19/2013 0523   GFRNONAA 10 (L) 09/10/2019 0517   GFRNONAA 34 (L) 07/19/2013 0523   GFRAA 12 (L) 09/10/2019 0517   GFRAA 40 (L) 07/19/2013 0523   CrCl cannot be calculated (Patient's most recent lab result is older than the maximum 21 days allowed.).  COAG Lab Results  Component Value Date   INR 1.0 01/29/2019   INR 1.3 (H) 01/26/2019   INR 1.28 02/22/2015    Radiology No results found.   Assessment/Plan 1. Complication from renal dialysis device, sequela Recommend:  The patient is doing well and currently has adequate dialysis access. [name] dialysis center is not reporting any access issues. Flow pattern is stable when compared to the prior ultrasound.  The patient should have a duplex ultrasound of the dialysis access in 6 months. The patient will follow-up with me in the office after each ultrasound     2. End stage renal disease ([place]) At the present time the patient has adequate dialysis access.  Continue hemodialysis as ordered without interruption.  Avoid nephrotoxic medications and dehydration.  Further plans per nephrology  3. Paroxysmal A-fib (H[facility]) Continue antiarrhythmia medications as already ordered, these medications have been reviewed and there are no changes at this time.  Continue anticoagulation as ordered by Cardiology Service   4. Benign essential HTN Continue antihypertensive medications as already ordered, these medications have been reviewed and there are no changes at this time.    Hortencia Pilar, MD  10/12/2019 5:11 PM

## 2019-10-25 ENCOUNTER — Encounter: Payer: Self-pay | Admitting: Student in an Organized Health Care Education/Training Program

## 2019-10-26 ENCOUNTER — Telehealth: Payer: Self-pay | Admitting: Pain Medicine

## 2019-10-26 ENCOUNTER — Telehealth: Payer: Self-pay | Admitting: *Deleted

## 2019-10-26 NOTE — Telephone Encounter (Signed)
Spoke with patient. She has discontinued the patch due to side effects with breathing and weakness. I will ask Dr. Holley Raring about moving her appointment up to discuss medication issues and call her back.

## 2019-10-26 NOTE — Telephone Encounter (Signed)
Patient is having breathing problems and extreme weakness with using the new pain patches.

## 2019-10-26 NOTE — Telephone Encounter (Deleted)
Patient added to Dr. Andree Elk schedule for 10/27/2019. He is having issues  with his extampza and wants to discuss with Dr. Andree Elk to see about changing due to side effects.

## 2019-10-26 NOTE — Telephone Encounter (Signed)
I have spoken with the patient directly and she states she will update the daughter about our conversation.

## 2019-10-27 NOTE — Telephone Encounter (Signed)
Patient is scheduled for tomorrow F2F appt.

## 2019-10-28 ENCOUNTER — Ambulatory Visit
Payer: Medicare Other | Attending: Student in an Organized Health Care Education/Training Program | Admitting: Student in an Organized Health Care Education/Training Program

## 2019-10-28 ENCOUNTER — Other Ambulatory Visit: Payer: Self-pay

## 2019-10-28 ENCOUNTER — Encounter: Payer: Self-pay | Admitting: Student in an Organized Health Care Education/Training Program

## 2019-10-28 VITALS — BP 160/84 | HR 108 | Temp 97.5°F | Resp 22 | Ht 63.0 in | Wt 240.0 lb

## 2019-10-28 DIAGNOSIS — M47816 Spondylosis without myelopathy or radiculopathy, lumbar region: Secondary | ICD-10-CM | POA: Diagnosis present

## 2019-10-28 DIAGNOSIS — G8929 Other chronic pain: Secondary | ICD-10-CM | POA: Insufficient documentation

## 2019-10-28 DIAGNOSIS — Z79899 Other long term (current) drug therapy: Secondary | ICD-10-CM | POA: Insufficient documentation

## 2019-10-28 DIAGNOSIS — M79605 Pain in left leg: Secondary | ICD-10-CM | POA: Diagnosis present

## 2019-10-28 DIAGNOSIS — M5442 Lumbago with sciatica, left side: Secondary | ICD-10-CM | POA: Insufficient documentation

## 2019-10-28 DIAGNOSIS — M7918 Myalgia, other site: Secondary | ICD-10-CM | POA: Diagnosis present

## 2019-10-28 DIAGNOSIS — R109 Unspecified abdominal pain: Secondary | ICD-10-CM | POA: Diagnosis present

## 2019-10-28 DIAGNOSIS — Z79891 Long term (current) use of opiate analgesic: Secondary | ICD-10-CM | POA: Insufficient documentation

## 2019-10-28 DIAGNOSIS — G894 Chronic pain syndrome: Secondary | ICD-10-CM | POA: Insufficient documentation

## 2019-10-28 DIAGNOSIS — M79604 Pain in right leg: Secondary | ICD-10-CM | POA: Insufficient documentation

## 2019-10-28 DIAGNOSIS — M5441 Lumbago with sciatica, right side: Secondary | ICD-10-CM | POA: Insufficient documentation

## 2019-10-28 MED ORDER — OXYCODONE-ACETAMINOPHEN 5-325 MG PO TABS: 1.0000 | ORAL_TABLET | Freq: Three times a day (TID) | ORAL | 0 refills | Status: AC | PRN

## 2019-10-28 MED ORDER — OXYCODONE-ACETAMINOPHEN 5-325 MG PO TABS: 1.0000 | ORAL_TABLET | Freq: Three times a day (TID) | ORAL | 0 refills | Status: DC | PRN

## 2019-10-28 NOTE — Progress Notes (Signed)
PROVIDER NOTE: Information contained herein reflects review and annotations entered in association with encounter. Interpretation of such information and data should be left to medically-trained personnel. Information provided to patient can be located elsewhere in the medical record under "Patient Instructions". Document created using STT-dictation technology, any transcriptional errors that may result from process are unintentional.    Patient: [name]  Service Category: E/M  Provider: Gillis Santa, MD  DOB: May 14, 1950  DOS: 10/28/2019  Specialty: Interventional Pain Management  MRN: [id number]  Setting: Ambulatory outpatient  PCP: Adin Hector, MD  Type: Established Patient    Referring Provider: Adin Hector, MD  Location: Office  Delivery: Face-to-face     HPI  Reason for encounter: Ms. FRANCENE MCERLEAN, a 69 y.o. year old female, is here today for evaluation and management of her Chronic pain syndrome [G89.4]. Ms. Murdy primary complain today is Back Pain Last encounter: Practice (10/26/2019). My last encounter with her was on Visit date not found. Pertinent problems: Ms. Malenfant has Long term current use of opiate analgesic; Long term prescription opiate use; Opiate use (22.5 MME/Day); Neurogenic pain; Chronic musculoskeletal pain; Visceral abdominal pain; Diabetic peripheral neuropathy ([place]); Chronic low back pain (Primary Area of Pain) (Bilateral) (L>R); Chronic lower extremity pain (Secondary area of Pain) (Bilateral) (R>L); Chronic hip pain (Bilateral) (L>R); Chronic neck pain (Bilateral) (L>R); Diabetic neuropathy ([place]); Lumbar spondylosis; Lumbar facet syndrome (Bilateral) (L>R); Polyneuropathy; Chronic pain syndrome; Spondylosis without myelopathy or radiculopathy, lumbosacral region; and DDD (degenerative disc disease), lumbosacral on their pertinent problem list. Pain Assessment: Severity of Chronic pain is reported as a 8 /10. Location: Back Lower/pain radiaties down both leg to  her feet, hand are ache and numb. Onset: More than a month ago. Quality: Aching, Burning, Throbbing, Numbness, Constant, Discomfort. Timing: Constant. Modifying factor(s): meds. Vitals:  height is _0  (1.6 m) and weight is 240 lb (108.9 kg). Her temporal temperature is 97.5 F (36.4 C) (abnormal). Her blood pressure is 160/84 (abnormal) and her pulse is 108 (abnormal). Her respiration is 22 (abnormal) and oxygen saturation is 98%.   Patient is being transferred from my colleague, Dr. Dossie Arbour.  This is her first time seeing me.  She presents today for medication management.  Of note, she was on oxycodone 5 mg 3 times daily as needed previously which was providing her with analgesic benefit.  At her last visit on 8/16, she was transition to buprenorphine which she has trialed but states that it was not effective.  In fact she had side effects of tachycardia and palpitations.  She did not experience any analgesic benefit with this medication.  She is requesting to transition back to oxycodone as she has been on for many years which she finds more helpful.  Pharmacotherapy Assessment   Analgesic: Oxycodone 5 mg 3 times daily as needed, quantity 90/month MME equals 22.5  Monitoring: [place] PMP: PDMP not reviewed this encounter.       Pharmacotherapy: No side-effects or adverse reactions reported. Compliance: No problems identified. Effectiveness: Clinically acceptable.  Chauncey Fischer, RN  10/28/2019  2:11 PM  Sign when Signing Visit Nursing Pain Medication Assessment:  Safety precautions to be maintained throughout the outpatient stay will include: orient to surroundings, keep bed in low position, maintain call bell within reach at all times, provide assistance with transfer out of bed and ambulation.  Medication Inspection Compliance: Pill count conducted under aseptic conditions, in front of the patient. Neither the pills nor the bottle was  removed from the patient's sight at any time. Once count was  completed pills were immediately returned to the patient in their original bottle.  Medication: Buprenorphine (Suboxone) Pill/Patch Count: 3 of 4 patches remain Pill/Patch Appearance: Markings consistent with prescribed medication Bottle Appearance: Standard pharmacy container. Clearly labeled. Filled Date: 8 / 20 / 21 Last Medication intake:  Ran out of medicine more than 48 hours agoNursing Pain Medication Assessment: Pt stated that she couldn't use them, don't like the way it made her feel. Safety precautions to be maintained throughout the outpatient stay will include: orient to surroundings, keep bed in low position, maintain call bell within reach at all times, provide assistance with transfer out of bed and ambulation.  Medication Inspection Compliance: Pill count conducted under aseptic conditions, in front of the patient. Neither the pills nor the bottle was removed from the patient's sight at any time. Once count was completed pills were immediately returned to the patient in their original bottle.  Nursing Pain Medication Assessment:  Safety precautions to be maintained throughout the outpatient stay will include: orient to surroundings, keep bed in low position, maintain call bell within reach at all times, provide assistance with transfer out of bed and ambulation.  Medication Inspection Compliance: Ms. Kilcrease did not comply with our request to bring her pills to be counted. She was reminded that bringing the medication bottles, even when empty, is a requirement.  Medication: None brought in. Pill/Patch Count: None available to be counted. Bottle Appearance: No container available. Did not bring bottle(s) to appointment. Filled Date: N/A Last Medication intake:  09/22/19    UDS:  Summary  Date Value Ref Range Status  07/06/2019 Note  Final    Comment:    ==================================================================== [employer]  (MW) ==================================================================== Test                             Result       Flag       Units Drug Present   Oxycodone                      469                     ng/mg creat   Oxymorphone                    903                     ng/mg creat   Noroxycodone                   290                     ng/mg creat   Noroxymorphone                 203                     ng/mg creat    Sources of oxycodone are scheduled prescription medications.    Oxymorphone, noroxycodone, and noroxymorphone are expected    metabolites of oxycodone. Oxymorphone is also available as a    scheduled prescription medication. ==================================================================== Test                      Result    Flag   Units  Ref Range   Creatinine              100              mg/dL      >=20 ==================================================================== Declared Medications:  Medication list was not provided. ==================================================================== For clinical consultation, please call [phone]. ====================================================================      ROS  Constitutional: Denies any fever or chills Gastrointestinal: No reported hemesis, hematochezia, vomiting, or acute GI distress Musculoskeletal: Positive low back, bilateral leg pain Neurological: No reported episodes of acute onset apraxia, aphasia, dysarthria, agnosia, amnesia, paralysis, loss of coordination, or loss of consciousness  Medication Review  DULoxetine, Vitamin D (Ergocalciferol), amiodarone, apixaban, atorvastatin, dupilumab, insulin glargine, insulin lispro, levothyroxine, oxyCODONE, oxyCODONE-acetaminophen, polyethylene glycol, and torsemide  History Review  Allergy: Ms. Delmont is allergic to buspirone, diltiazem hcl, gabapentin, hydralazine, lisinopril, lovastatin, metformin, pravastatin, sitagliptin, and  tramadol. Drug: Ms. Popoca  reports no history of drug use. Alcohol:  reports no history of alcohol use. Tobacco:  reports that she quit smoking about 6 years ago. Her smoking use included cigarettes. She has never used smokeless tobacco. Social: Ms. Bowerman  reports that she quit smoking about 6 years ago. Her smoking use included cigarettes. She has never used smokeless tobacco. She reports that she does not drink alcohol and does not use drugs. Medical:  has a past medical history of Anemia, Anxiety, Aortic valve stenosis, Arrhythmia, Arthritis, B12 deficiency, Bowel obstruction (HCC), CHF (congestive heart failure) ([place]), CKD (chronic kidney disease), Colostomy in place [facility]), Diabetes mellitus without complication ([place]), Dialysis patient [facility]), Diverticulitis large intestine, Dysrhythmia, Ectopic atrial tachycardia ([place]), FSGS (focal segmental glomerulosclerosis), Gastritis, GI bleed, Heart murmur, Hyperlipidemia, Hypertension, Hypothyroidism, Hypothyroidism, MRSA (methicillin resistant Staphylococcus aureus), Neuropathy, Neuropathy in diabetes ([place]), Obesity, Pedal edema, Psoriasis, Shortness of breath dyspnea, Sleep apnea, and Vertigo. Surgical: Ms. Shaw  has a past surgical history that includes Colectomy; laparotomy closure of cecal perforation (05/09/2013); Tonsillectomy; Back surgery; Cardiac catheterization; Abdominal hysterectomy; Debridement of abdominal wall abscess (N/A, 02/22/2015); Excision mass abdominal (N/A, 02/24/2015); Application if wound vac (N/A, 02/24/2015); Excision mass abdominal (N/A, 02/26/2015); Application if wound vac (N/A, 02/26/2015); Wound debridement (N/A, 03/03/2015); Wound debridement (N/A, 03/09/2015); Flexible sigmoidoscopy (06/19/2015); Colostomy reversal (N/A, 07/12/2015); laparotomy (07/12/2015); laparoscopy (07/12/2015); Lysis of adhesion (07/12/2015); laparotomy (N/A, 02/06/2015); Colonoscopy with propofol (N/A, 09/04/2016); Cardiac valve replacement (08/2017); Cardioversion (N/A,  07/15/2018); DIALYSIS/PERMA CATHETER INSERTION (N/A, 10/20/2018); Cataract extraction w/PHACO (Right, 11/16/2018); Cataract extraction w/PHACO (Left, 12/07/2018); Cholecystectomy; Hernia repair; AV fistula placement (Left, 01/29/2019); and DIALYSIS/PERMA CATHETER REMOVAL (N/A, 08/03/2019). Family: family history includes CAD in her father; Colon cancer in her brother; Diabetes in her mother; Heart attack in her father; Hypertension in her father.  Laboratory Chemistry Profile   Renal Lab Results  Component Value Date   BUN 37 (H) 09/10/2019   CREATININE 4.27 (H) 09/10/2019   LABCREA 26 07/18/2015   GFRAA 12 (L) 09/10/2019   GFRNONAA 10 (L) 09/10/2019     Hepatic Lab Results  Component Value Date   AST 19 07/24/2017   ALT 14 07/24/2017   ALBUMIN 3.3 (L) 06/13/2019   ALKPHOS 59 07/24/2017   LIPASE 21 02/04/2015     Electrolytes Lab Results  Component Value Date   NA 137 09/10/2019   K 4.3 09/10/2019   CL 96 (L) 09/10/2019   CALCIUM 8.3 (L) 09/10/2019   MG 2.0 06/13/2019   PHOS 4.4 06/13/2019     Bone Lab Results  Component Value Date   25OHVITD1 43 11/07/2015  25OHVITD2 38 11/07/2015   25OHVITD3 5.4 11/07/2015     Inflammation (CRP: Acute Phase) (ESR: Chronic Phase) Lab Results  Component Value Date   CRP 0.6 11/07/2015   ESRSEDRATE 16 11/07/2015   LATICACIDVEN 0.8 02/21/2015       Note: Above Lab results reviewed.  Recent Imaging Review  VAS US DUPLEX DIALYSIS ACCESS (AVF, AVG) DIALYSIS ACCESS  Reason for Exam: Routine follow up.  Access Site: Left Upper Extremity.  Access Type: Brachial-cephalic AVF.  Comparison Study: 04/12/2019  Performing Technologist: Concha Norway RVT    Examination Guidelines: A complete evaluation includes B-mode imaging, spectral Doppler, color Doppler, and power Doppler as needed of all accessible portions of each vessel. Unilateral testing is considered an integral part of a complete examination. Limited examinations for  reoccurring indications may be performed as noted.    Findings: +--------------------+----------+-----------------+--------+ AVF                 PSV (cm/s)Flow Vol (mL/min)Comments +--------------------+----------+-----------------+--------+ Native artery inflow   140          2022                +--------------------+----------+-----------------+--------+ AVF Anastomosis        377                              +--------------------+----------+-----------------+--------+    +---------------+----------+-------------+----------+--------+ OUTFLOW VEIN   PSV (cm/s)Diameter (cm)Depth (cm)Describe +---------------+----------+-------------+----------+--------+ Subclavian vein   138                                    +---------------+----------+-------------+----------+--------+ Confluence        154                                    +---------------+----------+-------------+----------+--------+ Prox UA           103                                    +---------------+----------+-------------+----------+--------+ Mid UA            107                                    +---------------+----------+-------------+----------+--------+ Dist UA            85        1.84                        +---------------+----------+-------------+----------+--------+     +----------------+-------------+----------+---------+----------+---------------+                 Diameter (cm)Depth (cm)BranchingPSV (cm/s)  Flow Volume                                                                (ml/min)     +----------------+-------------+----------+---------+----------+---------------+ Left Rad Art  30                    distal                                                                    +----------------+-------------+----------+---------+----------+---------------+ Antegrade                                                                  +----------------+-------------+----------+---------+----------+---------------+    Summary: Patent left brachiocephalic AVF with a dilated distal upper arm region to 1.84cm as has been seen previously, and no evidence of stenosis.   *See table(s) above for measurements and observations.   Diagnosing physician: Hortencia Pilar MD Electronically signed by Hortencia Pilar MD on 10/14/2019 at 3:53:33 PM.         --------------------------------------------------------------------------------    Final   Note: Reviewed        Physical Exam  General appearance: Well nourished, well developed, and well hydrated. In no apparent acute distress Mental status: Alert, oriented x 3 (person, place, & time)       Respiratory: No evidence of acute respiratory distress Eyes: PERLA Vitals: BP (!) 160/84 (BP Location: Left Arm, Patient Position: Sitting, Cuff Size: Normal)   Pulse (!) 108   Temp (!) 97.5 F (36.4 C) (Temporal)   Resp (!) 22   Ht _0  (1.6 m)   Wt 240 lb (108.9 kg)   SpO2 98%   BMI 42.51 kg/m  BMI: Estimated body mass index is 42.51 kg/m as calculated from the following:   Height as of this encounter: _1  (1.6 m).   Weight as of this encounter: 240 lb (108.9 kg). Ideal: Ideal body weight: 52.4 kg (115 lb 8.3 oz) Adjusted ideal body weight: 75 kg (165 lb 5 oz)  Lumbar Spine Area Exam  Skin & Axial Inspection: Lumbar Scoliosis Alignment: Asymmetric Functional ROM: Pain restricted ROM       Stability: No instability detected Muscle Tone/Strength: Functionally intact. No obvious neuro-muscular anomalies detected. Sensory (Neurological): Musculoskeletal pain pattern  Gait & Posture Assessment  Ambulation: Patient came in today in a wheel chair Gait: Limited. Using assistive device to ambulate Posture: Difficulty standing up straight, due to pain  Lower Extremity Exam    Side: Right lower extremity  Side: Left lower  extremity  Stability: No instability observed          Stability: No instability observed          Skin & Extremity Inspection: Skin color, temperature, and hair growth are WNL. No peripheral edema or cyanosis. No masses, redness, swelling, asymmetry, or associated skin lesions. No contractures.  Skin & Extremity Inspection: Skin color, temperature, and hair growth are WNL. No peripheral edema or cyanosis. No masses, redness, swelling, asymmetry, or associated skin lesions. No contractures.  Functional ROM: Pain restricted ROM for all joints of the lower extremity Limited SLR (straight leg raise)  Functional ROM: Pain restricted ROM for all joints of the lower extremity Limited SLR (straight leg raise)  Muscle  Tone/Strength: Functionally intact. No obvious neuro-muscular anomalies detected.  Muscle Tone/Strength: Functionally intact. No obvious neuro-muscular anomalies detected.  Sensory (Neurological): Dermatomal pain pattern        Sensory (Neurological): Dermatomal pain pattern        DTR: Patellar: deferred today Achilles: deferred today Plantar: deferred today  DTR: Patellar: deferred today Achilles: deferred today Plantar: deferred today  Palpation: No palpable anomalies  Palpation: No palpable anomalies    Assessment   Status Diagnosis  Controlled Controlled Controlled 1. Chronic pain syndrome   2. Chronic low back pain (Primary Area of Pain) (Bilateral) (L>R)   3. Chronic lower extremity pain (Secondary area of Pain) (Bilateral) (R>L)   4. Pharmacologic therapy   5. Chronic abdominal pain (Third area of Pain) (Bilateral) (L>R)   6. Lumbar facet syndrome (Bilateral) (L>R)   7. Lumbar spondylosis   8. Visceral abdominal pain   9. Musculoskeletal pain   10. Long term current use of opiate analgesic       Plan of Care  Ms. HETTIE ROSELLI has a current medication list which includes the following long-term medication(s): apixaban, atorvastatin, duloxetine, humalog kwikpen,  levothyroxine, oxycodone, [START ON 10/31/2019] oxycodone, and [START ON 11/30/2019] oxycodone.  Discontinue Butrans given side effects of tachycardia and palpitations.  We will transition back to oxycodone but recommend Percocet as below.  No change in MME.  Prescription for 3 months.  Urine toxicology screen up-to-date and appropriate.  Pharmacotherapy (Medications Ordered): Meds ordered this encounter  Medications  . oxyCODONE-acetaminophen (PERCOCET) 5-325 MG tablet    Sig: Take 1 tablet by mouth every 8 (eight) hours as needed for severe pain. Must last 30 days.    Dispense:  90 tablet    Refill:  0    Chronic Pain. (STOP Act - Not applicable). Fill one day early if closed on scheduled refill date.  Marland Kitchen oxyCODONE-acetaminophen (PERCOCET) 5-325 MG tablet    Sig: Take 1 tablet by mouth every 8 (eight) hours as needed for severe pain. Must last 30 days.    Dispense:  90 tablet    Refill:  0    Chronic Pain. (STOP Act - Not applicable). Fill one day early if closed on scheduled refill date.  Marland Kitchen oxyCODONE-acetaminophen (PERCOCET) 5-325 MG tablet    Sig: Take 1 tablet by mouth every 8 (eight) hours as needed for severe pain. Must last 30 days.    Dispense:  90 tablet    Refill:  0    Chronic Pain. (STOP Act - Not applicable). Fill one day early if closed on scheduled refill date.   Follow-up plan:   Return in about 3 months (around 01/27/2020) for Medication Management, in person.   Recent Visits Date Type Provider Dept  09/29/19 Office Visit Milinda Pointer, MD [facility]  Showing recent visits within past 90 days and meeting all other requirements Today's Visits Date Type Provider Dept  10/28/19 Office Visit Gillis Santa, MD [facility]  Showing today's visits and meeting all other requirements Future Appointments No visits were found meeting these conditions. Showing future appointments within next 90 days and meeting all other requirements  I discussed the  assessment and treatment plan with the patient. The patient was provided an opportunity to ask questions and all were answered. The patient agreed with the plan and demonstrated an understanding of the instructions.  Patient advised to call back or seek an in-person evaluation if the symptoms or condition worsens.  Duration of encounter: 31mnutes.  Note by: Gillis Santa, MD Date: 10/28/2019; Time: 3:14 PM

## 2019-10-28 NOTE — Progress Notes (Signed)
Nursing Pain Medication Assessment:  Safety precautions to be maintained throughout the outpatient stay will include: orient to surroundings, keep bed in low position, maintain call bell within reach at all times, provide assistance with transfer out of bed and ambulation.  Medication Inspection Compliance: Pill count conducted under aseptic conditions, in front of the patient. Neither the pills nor the bottle was removed from the patient's sight at any time. Once count was completed pills were immediately returned to the patient in their original bottle.  Medication: Buprenorphine (Suboxone) Pill/Patch Count: 3 of 4 patches remain Pill/Patch Appearance: Markings consistent with prescribed medication Bottle Appearance: Standard pharmacy container. Clearly labeled. Filled Date: 8 / 20 / 21 Last Medication intake:  Ran out of medicine more than 48 hours agoNursing Pain Medication Assessment: Pt stated that she couldn't use them, don't like the way it made her feel. Safety precautions to be maintained throughout the outpatient stay will include: orient to surroundings, keep bed in low position, maintain call bell within reach at all times, provide assistance with transfer out of bed and ambulation.  Medication Inspection Compliance: Pill count conducted under aseptic conditions, in front of the patient. Neither the pills nor the bottle was removed from the patient's sight at any time. Once count was completed pills were immediately returned to the patient in their original bottle.  Nursing Pain Medication Assessment:  Safety precautions to be maintained throughout the outpatient stay will include: orient to surroundings, keep bed in low position, maintain call bell within reach at all times, provide assistance with transfer out of bed and ambulation.  Medication Inspection Compliance: Ms. Ligman did not comply with our request to bring her pills to be counted. She was reminded that bringing the medication  bottles, even when empty, is a requirement.  Medication: None brought in. Pill/Patch Count: None available to be counted. Bottle Appearance: No container available. Did not bring bottle(s) to appointment. Filled Date: N/A Last Medication intake:  09/22/19

## 2019-10-29 ENCOUNTER — Ambulatory Visit: Payer: Medicare Other | Admitting: Family

## 2019-11-08 NOTE — Progress Notes (Deleted)
Patient ID: [name], female    DOB: 1950-05-30, 69 y.o.   MRN: [id number]  HPI  Ms [name] is a 69 y/o female with a history of DM, hyperlipidemia, HTN, CKD, thyroid disease, GIB, aortic valve stenosis, anxiety, anemia, atrial fibrillation, prior tobacco use and chronic heart failure.   Echo report from 09/08/19 reviewed and showed an EF of 50-55% along with mild LVH and mild MR. Echo report from 12/21/2018 reviewed and showed an EF of 45% along with mild MR/TR and prosthetic AV. Echo report from 06/24/2018 reviewed and showed an EF of 25% along with moderate MR/TR.   Admitted 09/07/19 due to acute on chronic HF. Cardiology & nephrology consults obtained. Initially needed IV lasix with transition to oral diuretics. Needed dialysis. Discharged after 3 days. Admitted 06/10/19 due to acute respiratory failure. Cardiology consult obtained. Initially given IV lasix and hemodialysis started. Desat on room air so needed oxygen at 2L. Soft BP so medications adjusted. Discharged after 4 days. Was in the ED 06/02/19 due to weakness where she was evaluated and released.   She presents today for a follow-up visit with a chief complaint of   Says that she has nasal pillows for her sleep apnea at home but says that she "just can't get used to them" and that they cause a "panic attack".   Receiving dialysis three days/ week.   Past Medical History:  Diagnosis Date  . Anemia   . Anxiety   . Aortic valve stenosis   . Arrhythmia    atrial fibrillation  . Arthritis    feet, legs  . B12 deficiency   . Bowel obstruction ([place])   . CHF (congestive heart failure) ([place])   . CKD (chronic kidney disease)    protein in urine  . Colostomy in place [facility])   . Diabetes mellitus without complication ([place])   . Dialysis patient [facility])    T/Th/Sa  . Diverticulitis large intestine   . Dysrhythmia   . 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 FSGS (focal segmental glomerulosclerosis)   . Gastritis   . GI bleed   . Heart  murmur   . Hyperlipidemia   . Hypertension   . Hypothyroidism   . Hypothyroidism    unspecified  . MRSA (methicillin resistant Staphylococcus aureus)    at abdominal wound.  Jan 2017.  Treated.   . Neuropathy   . Neuropathy in diabetes ([place])   . Obesity   . Pedal edema   . Psoriasis   . Shortness of breath dyspnea   . Sleep apnea    CPAP  . Vertigo    Past Surgical History:  Procedure Laterality Date  . ABDOMINAL HYSTERECTOMY    . APPLICATION OF WOUND VAC N/A 02/24/2015   Procedure: APPLICATION OF WOUND VAC;  Surgeon: Clayburn Pert, MD;  Location: [facility];  Service: General;  Laterality: N/A;  . APPLICATION OF WOUND VAC N/A 02/26/2015   Procedure: APPLICATION OF WOUND VAC;  Surgeon: Clayburn Pert, MD;  Location: [facility];  Service: General;  Laterality: N/A;  . AV FISTULA PLACEMENT Left 01/29/2019   Procedure: ARTERIOVENOUS (AV) FISTULA CREATION ( BRACHIAL CEPHALIC);  Surgeon: Katha Cabal, MD;  Location: [facility];  Service: Vascular;  Laterality: Left;  . BACK SURGERY     spur frmoved from lower back  . CARDIAC CATHETERIZATION    . CARDIAC VALVE REPLACEMENT  08/2017   [facility]  . CARDIOVERSION N/A 07/15/2018   Procedure: CARDIOVERSION;  Surgeon: Serafina Royals  J, MD;  Location: [facility];  Service: Cardiovascular;  Laterality: N/A;  . CATARACT EXTRACTION W/PHACO Right 11/16/2018   Procedure: CATARACT EXTRACTION PHACO AND INTRAOCULAR LENS PLACEMENT (IOC) RIGHT DIABETiC  01:18.3  13.5%  10.82;  Surgeon: Eulogio Bear, MD;  Location: [place];  Service: Ophthalmology;  Laterality: Right;  Diabetic - insulin sleep apnea  . CATARACT EXTRACTION W/PHACO Left 12/07/2018   Procedure: CATARACT EXTRACTION PHACO AND INTRAOCULAR LENS PLACEMENT (IOC) LEFT DIABETIC 01:02.6  8.8%  5.77;  Surgeon: Eulogio Bear, MD;  Location: [place];  Service: Ophthalmology;  Laterality: Left;  Diabetic - insulin  . CHOLECYSTECTOMY    . COLECTOMY    . COLONOSCOPY WITH  PROPOFOL N/A 09/04/2016   Procedure: COLONOSCOPY WITH PROPOFOL;  Surgeon: Manya Silvas, MD;  Location: [facility] ENDOSCOPY;  Service: Endoscopy;  Laterality: N/A;  . COLOSTOMY REVERSAL N/A 07/12/2015   Procedure: COLOSTOMY REVERSAL;  Surgeon: Clayburn Pert, MD;  Location: [facility];  Service: General;  Laterality: N/A;  . DEBRIDEMENT OF ABDOMINAL WALL ABSCESS N/A 02/22/2015   Procedure: DEBRIDEMENT OF ABDOMINAL WALL ABSCESS;  Surgeon: Clayburn Pert, MD;  Location: [facility];  Service: General;  Laterality: N/A;  . DIALYSIS/PERMA CATHETER INSERTION N/A 10/20/2018   Procedure: DIALYSIS/PERMA CATHETER INSERTION;  Surgeon: Katha Cabal, MD;  Location: [place] CV [facility];  Service: Cardiovascular;  Laterality: N/A;  . DIALYSIS/PERMA CATHETER REMOVAL N/A 08/03/2019   Procedure: DIALYSIS/PERMA CATHETER REMOVAL;  Surgeon: Katha Cabal, MD;  Location: [place] CV [facility];  Service: Cardiovascular;  Laterality: N/A;  . EXCISION MASS ABDOMINAL N/A 02/24/2015   Procedure: EXCISION MASS ABDOMINAL  / [place];  Surgeon: Clayburn Pert, MD;  Location: [facility];  Service: General;  Laterality: N/A;  . EXCISION MASS ABDOMINAL N/A 02/26/2015   Procedure: EXCISION MASS ABDOMINAL/wash out;  Surgeon: Clayburn Pert, MD;  Location: [facility];  Service: General;  Laterality: N/A;  . FLEXIBLE SIGMOIDOSCOPY  06/19/2015   Procedure: FLEXIBLE SIGMOIDOSCOPY;  Surgeon: Lucilla Lame, MD;  Location: [place];  Service: Endoscopy;;  UNABLE TO ACCESS OSTOMY SITE FOR ACCESS INTO COLON  . HERNIA REPAIR     umbilical  . LAPAROSCOPY  07/12/2015   Procedure: LAPAROSCOPY DIAGNOSTIC;  Surgeon: Clayburn Pert, MD;  Location: [facility];  Service: General;;  . LAPAROTOMY  07/12/2015   Procedure: EXPLORATORY LAPAROTOMY;  Surgeon: Clayburn Pert, MD;  Location: [facility];  Service: General;;  . LAPAROTOMY N/A 02/06/2015   Procedure: Laparotomy, reduction of incarcerated parastomal hernia, repair of parastomal hernia with  mesh;  Surgeon: Sherri Rad, MD;  Location: [facility];  Service: General;  Laterality: N/A;  . laparotomy closure of cecal perforation  05/09/2013   Dr. Marina Gravel  . LYSIS OF ADHESION  07/12/2015   Procedure: LYSIS OF ADHESION;  Surgeon: Clayburn Pert, MD;  Location: [facility];  Service: General;;  . TONSILLECTOMY    . WOUND DEBRIDEMENT N/A 03/03/2015   Procedure: DEBRIDEMENT ABDOMINAL WOUND;  Surgeon: Florene Glen, MD;  Location: [facility];  Service: General;  Laterality: N/A;  . WOUND DEBRIDEMENT N/A 03/09/2015   Procedure: DEBRIDEMENT ABDOMINAL WOUND;  Surgeon: Florene Glen, MD;  Location: [facility];  Service: General;  Laterality: N/A;   Family History  Problem Relation Age of Onset  . Diabetes Mother   . Hypertension Father   . CAD Father   . Heart attack Father   . Colon cancer Brother    Social History   Tobacco Use  . Smoking status: Former  Smoker    Types: Cigarettes    Quit date: 04/18/2013    Years since quitting: 6.5  . Smokeless tobacco: Never Used  Substance Use Topics  . Alcohol use: No    Alcohol/week: 0.0 standard drinks   Allergies  Allergen Reactions  . Buspirone Other (See Comments)    Weakness  . Diltiazem Hcl Palpitations  . Gabapentin Palpitations  . Hydralazine Rash  . Lisinopril Cough  . Lovastatin Palpitations  . Metformin Diarrhea  . Pravastatin Other (See Comments)    Insomnia  . Sitagliptin Other (See Comments)    Constipation  . Tramadol Itching     Review of Systems  Constitutional: Positive for fatigue (easily). Negative for activity change.  HENT: Negative for congestion, rhinorrhea and sore throat.   Eyes: Negative.   Respiratory: Positive for shortness of breath (with minimal exertion). Negative for cough.   Cardiovascular: Negative for chest pain, palpitations and leg swelling.  Gastrointestinal: Negative for abdominal distention and abdominal pain.  Endocrine: Negative.   Genitourinary: Negative.   Musculoskeletal: Positive for  back pain (at times). Negative for neck pain.  Skin: Negative.   Allergic/Immunologic: Negative.   Neurological: Positive for weakness and numbness (in both hands). Negative for dizziness and light-headedness.  Hematological: Negative for adenopathy. Does not bruise/bleed easily.  Psychiatric/Behavioral: Positive for sleep disturbance (not sleeping well). Negative for dysphoric mood. The patient is not nervous/anxious.      Physical Exam Vitals and nursing note reviewed.  Constitutional:      Appearance: She is well-developed.  HENT:     Head: Normocephalic and atraumatic.  Cardiovascular:     Rate and Rhythm: Regular rhythm. Tachycardia present.  Pulmonary:     Effort: Pulmonary effort is normal. No respiratory distress.     Breath sounds: No wheezing or rales.  Abdominal:     Palpations: Abdomen is soft.     Tenderness: There is no abdominal tenderness.  Musculoskeletal:     Right lower leg: No tenderness. No edema.     Left lower leg: No tenderness. No edema.  Skin:    General: Skin is warm and dry.  Neurological:     General: No focal deficit present.     Mental Status: She is alert and oriented to person, place, and time.  Psychiatric:        Mood and Affect: Mood normal.        Behavior: Behavior normal.     Assessment & Plan:  1. Chronic heart failure with preserved ejection fraction with structural changes- - NYHA class III - euvolemic today  - weighing daily; reminded to call for an overnight weight gain of >2 pounds or a weekly weight gain of >5 pounds - weight 249.2 from last visit here ~ 4 months ago - not adding salt to her food and has been reading food labels for sodium content. Reminded to keep daily sodium intake to 2000mg  sodium/ day - GFR too low for farxiga - saw cardiology ([facility]) 10/13/19 - BNP 09/07/19 was 679.2 - encouraged her to use the nasal pillows during the day even for 10 minutes at a time to see if she can get herself used to them;  explained the correlation between her untreated sleep apnea and heart failure.   2: HTN- - BP - saw PCP Caryl Comes) 08/27/19 - BMP from 09/10/19 reviewed and showed sodium 137, potassium 4.3, creatinine 4.27 and GFR 12  3: DM- - A1c on 09/07/19 was 6.8% - glucose at home was  -  saw endocrinology ([facility]) 10/15/19 - dialysis on M,W,F    Patient did not bring her medications nor a list. Each medication was verbally reviewed with the patient and she was encouraged to bring the bottles to every visit to confirm accuracy of list.

## 2019-11-09 ENCOUNTER — Telehealth: Payer: Self-pay | Admitting: Family

## 2019-11-09 ENCOUNTER — Ambulatory Visit: Payer: Medicare Other | Admitting: Family

## 2019-11-09 NOTE — Telephone Encounter (Signed)
Patient did not show for her [facility] appointment on 11/09/19. Will attempt to reschedule.

## 2019-12-13 ENCOUNTER — Emergency Department
Admission: EM | Admit: 2019-12-13 | Discharge: 2019-12-13 | Discharge status: Against Medical Advice | Payer: Medicare Other

## 2019-12-28 ENCOUNTER — Encounter: Payer: Medicare Other | Admitting: Student in an Organized Health Care Education/Training Program

## 2020-01-23 ENCOUNTER — Inpatient Hospital Stay
Admission: EM | Admit: 2020-01-23 | Discharge: 2020-02-01 | DRG: 252 | Disposition: A | Discharge status: Home / Self Care | Payer: Medicare Other | Attending: Obstetrics and Gynecology | Admitting: Obstetrics and Gynecology

## 2020-01-23 ENCOUNTER — Emergency Department: Payer: Medicare Other

## 2020-01-23 DIAGNOSIS — Z794 Long term (current) use of insulin: Secondary | ICD-10-CM

## 2020-01-23 DIAGNOSIS — D631 Anemia in chronic kidney disease: Secondary | ICD-10-CM | POA: Diagnosis present

## 2020-01-23 DIAGNOSIS — I10 Essential (primary) hypertension: Secondary | ICD-10-CM | POA: Diagnosis present

## 2020-01-23 DIAGNOSIS — I953 Hypotension of hemodialysis: Secondary | ICD-10-CM | POA: Diagnosis not present

## 2020-01-23 DIAGNOSIS — I5023 Acute on chronic systolic (congestive) heart failure: Secondary | ICD-10-CM | POA: Diagnosis present

## 2020-01-23 DIAGNOSIS — Z9842 Cataract extraction status, left eye: Secondary | ICD-10-CM

## 2020-01-23 DIAGNOSIS — N179 Acute kidney failure, unspecified: Secondary | ICD-10-CM | POA: Diagnosis present

## 2020-01-23 DIAGNOSIS — R0902 Hypoxemia: Secondary | ICD-10-CM | POA: Diagnosis not present

## 2020-01-23 DIAGNOSIS — N2581 Secondary hyperparathyroidism of renal origin: Secondary | ICD-10-CM | POA: Diagnosis present

## 2020-01-23 DIAGNOSIS — J9601 Acute respiratory failure with hypoxia: Secondary | ICD-10-CM | POA: Diagnosis not present

## 2020-01-23 DIAGNOSIS — E875 Hyperkalemia: Secondary | ICD-10-CM | POA: Diagnosis present

## 2020-01-23 DIAGNOSIS — D649 Anemia, unspecified: Secondary | ICD-10-CM | POA: Diagnosis present

## 2020-01-23 DIAGNOSIS — E8770 Fluid overload, unspecified: Secondary | ICD-10-CM

## 2020-01-23 DIAGNOSIS — E669 Obesity, unspecified: Secondary | ICD-10-CM | POA: Diagnosis present

## 2020-01-23 DIAGNOSIS — M51379 Other intervertebral disc degeneration, lumbosacral region without mention of lumbar back pain or lower extremity pain: Secondary | ICD-10-CM | POA: Diagnosis present

## 2020-01-23 DIAGNOSIS — E782 Mixed hyperlipidemia: Secondary | ICD-10-CM | POA: Diagnosis present

## 2020-01-23 DIAGNOSIS — I132 Hypertensive heart and chronic kidney disease with heart failure and with stage 5 chronic kidney disease, or end stage renal disease: Principal | ICD-10-CM | POA: Diagnosis present

## 2020-01-23 DIAGNOSIS — E871 Hypo-osmolality and hyponatremia: Secondary | ICD-10-CM | POA: Diagnosis present

## 2020-01-23 DIAGNOSIS — Z87891 Personal history of nicotine dependence: Secondary | ICD-10-CM

## 2020-01-23 DIAGNOSIS — Z888 Allergy status to other drugs, medicaments and biological substances status: Secondary | ICD-10-CM

## 2020-01-23 DIAGNOSIS — Z992 Dependence on renal dialysis: Secondary | ICD-10-CM

## 2020-01-23 DIAGNOSIS — Z8614 Personal history of Methicillin resistant Staphylococcus aureus infection: Secondary | ICD-10-CM

## 2020-01-23 DIAGNOSIS — Z90710 Acquired absence of both cervix and uterus: Secondary | ICD-10-CM

## 2020-01-23 DIAGNOSIS — R5381 Other malaise: Secondary | ICD-10-CM | POA: Diagnosis present

## 2020-01-23 DIAGNOSIS — Z8249 Family history of ischemic heart disease and other diseases of the circulatory system: Secondary | ICD-10-CM

## 2020-01-23 DIAGNOSIS — Z86718 Personal history of other venous thrombosis and embolism: Secondary | ICD-10-CM

## 2020-01-23 DIAGNOSIS — Z6841 Body Mass Index (BMI) 40.0 and over, adult: Secondary | ICD-10-CM

## 2020-01-23 DIAGNOSIS — Y832 Surgical operation with anastomosis, bypass or graft as the cause of abnormal reaction of the patient, or of later complication, without mention of misadventure at the time of the procedure: Secondary | ICD-10-CM | POA: Diagnosis not present

## 2020-01-23 DIAGNOSIS — R0600 Dyspnea, unspecified: Secondary | ICD-10-CM

## 2020-01-23 DIAGNOSIS — G4733 Obstructive sleep apnea (adult) (pediatric): Secondary | ICD-10-CM

## 2020-01-23 DIAGNOSIS — N269 Renal sclerosis, unspecified: Secondary | ICD-10-CM | POA: Diagnosis present

## 2020-01-23 DIAGNOSIS — Z79890 Hormone replacement therapy (postmenopausal): Secondary | ICD-10-CM

## 2020-01-23 DIAGNOSIS — Z9049 Acquired absence of other specified parts of digestive tract: Secondary | ICD-10-CM

## 2020-01-23 DIAGNOSIS — E11649 Type 2 diabetes mellitus with hypoglycemia without coma: Secondary | ICD-10-CM | POA: Diagnosis not present

## 2020-01-23 DIAGNOSIS — E034 Atrophy of thyroid (acquired): Secondary | ICD-10-CM | POA: Diagnosis not present

## 2020-01-23 DIAGNOSIS — Z952 Presence of prosthetic heart valve: Secondary | ICD-10-CM

## 2020-01-23 DIAGNOSIS — K5900 Constipation, unspecified: Secondary | ICD-10-CM | POA: Diagnosis not present

## 2020-01-23 DIAGNOSIS — Z7901 Long term (current) use of anticoagulants: Secondary | ICD-10-CM

## 2020-01-23 DIAGNOSIS — Z833 Family history of diabetes mellitus: Secondary | ICD-10-CM

## 2020-01-23 DIAGNOSIS — N189 Chronic kidney disease, unspecified: Secondary | ICD-10-CM | POA: Diagnosis present

## 2020-01-23 DIAGNOSIS — I248 Other forms of acute ischemic heart disease: Secondary | ICD-10-CM | POA: Diagnosis present

## 2020-01-23 DIAGNOSIS — J9621 Acute and chronic respiratory failure with hypoxia: Secondary | ICD-10-CM | POA: Diagnosis present

## 2020-01-23 DIAGNOSIS — Z20822 Contact with and (suspected) exposure to covid-19: Secondary | ICD-10-CM | POA: Diagnosis present

## 2020-01-23 DIAGNOSIS — I700 Atherosclerosis of aorta: Secondary | ICD-10-CM | POA: Diagnosis present

## 2020-01-23 DIAGNOSIS — Z961 Presence of intraocular lens: Secondary | ICD-10-CM | POA: Diagnosis present

## 2020-01-23 DIAGNOSIS — J9811 Atelectasis: Secondary | ICD-10-CM | POA: Diagnosis present

## 2020-01-23 DIAGNOSIS — T82858A Stenosis of vascular prosthetic devices, implants and grafts, initial encounter: Secondary | ICD-10-CM | POA: Diagnosis not present

## 2020-01-23 DIAGNOSIS — N186 End stage renal disease: Secondary | ICD-10-CM | POA: Diagnosis present

## 2020-01-23 DIAGNOSIS — Z79899 Other long term (current) drug therapy: Secondary | ICD-10-CM

## 2020-01-23 DIAGNOSIS — G894 Chronic pain syndrome: Secondary | ICD-10-CM | POA: Diagnosis present

## 2020-01-23 DIAGNOSIS — M5137 Other intervertebral disc degeneration, lumbosacral region: Secondary | ICD-10-CM | POA: Diagnosis present

## 2020-01-23 DIAGNOSIS — Z9841 Cataract extraction status, right eye: Secondary | ICD-10-CM

## 2020-01-23 DIAGNOSIS — R062 Wheezing: Secondary | ICD-10-CM

## 2020-01-23 DIAGNOSIS — Z800 Family history of malignant neoplasm of digestive organs: Secondary | ICD-10-CM

## 2020-01-23 DIAGNOSIS — I4819 Other persistent atrial fibrillation: Secondary | ICD-10-CM | POA: Diagnosis present

## 2020-01-23 DIAGNOSIS — I429 Cardiomyopathy, unspecified: Secondary | ICD-10-CM | POA: Diagnosis present

## 2020-01-23 DIAGNOSIS — E1142 Type 2 diabetes mellitus with diabetic polyneuropathy: Secondary | ICD-10-CM | POA: Diagnosis present

## 2020-01-23 DIAGNOSIS — E1122 Type 2 diabetes mellitus with diabetic chronic kidney disease: Secondary | ICD-10-CM | POA: Diagnosis present

## 2020-01-23 DIAGNOSIS — Z933 Colostomy status: Secondary | ICD-10-CM

## 2020-01-23 DIAGNOSIS — N051 Unspecified nephritic syndrome with focal and segmental glomerular lesions: Secondary | ICD-10-CM | POA: Diagnosis present

## 2020-01-23 LAB — CBC
HCT: 37.6 % (ref 36.0–46.0)
Hemoglobin: 11.3 g/dL — ABNORMAL LOW (ref 12.0–15.0)
MCH: 29.7 pg (ref 26.0–34.0)
MCHC: 30.1 g/dL (ref 30.0–36.0)
MCV: 98.7 fL (ref 80.0–100.0)
Platelets: 193 10*3/uL (ref 150–400)
RBC: 3.81 MIL/uL — ABNORMAL LOW (ref 3.87–5.11)
RDW: 16.2 % — ABNORMAL HIGH (ref 11.5–15.5)
WBC: 9.6 10*3/uL (ref 4.0–10.5)
nRBC: 0 % (ref 0.0–0.2)

## 2020-01-23 LAB — CBG MONITORING, ED: Glucose-Capillary: 170 mg/dL — ABNORMAL HIGH (ref 70–99)

## 2020-01-23 LAB — TROPONIN I (HIGH SENSITIVITY)
Troponin I (High Sensitivity): 78 ng/L — ABNORMAL HIGH (ref <18)
Troponin I (High Sensitivity): 82 ng/L — ABNORMAL HIGH (ref <18)

## 2020-01-23 LAB — COMPREHENSIVE METABOLIC PANEL
ALT: 18 U/L (ref 0–44)
AST: 22 U/L (ref 15–41)
Albumin: 3.8 g/dL (ref 3.5–5.0)
Alkaline Phosphatase: 58 U/L (ref 38–126)
Anion gap: 12 (ref 5–15)
BUN: 41 mg/dL — ABNORMAL HIGH (ref 8–23)
CO2: 28 mmol/L (ref 22–32)
Calcium: 8.8 mg/dL — ABNORMAL LOW (ref 8.9–10.3)
Chloride: 95 mmol/L — ABNORMAL LOW (ref 98–111)
Creatinine, Ser: 6.26 mg/dL — ABNORMAL HIGH (ref 0.44–1.00)
GFR, Estimated: 7 mL/min — ABNORMAL LOW (ref >60)
Glucose, Bld: 214 mg/dL — ABNORMAL HIGH (ref 70–99)
Potassium: 4.5 mmol/L (ref 3.5–5.1)
Sodium: 135 mmol/L (ref 135–145)
Total Bilirubin: 0.9 mg/dL (ref 0.3–1.2)
Total Protein: 7.5 g/dL (ref 6.5–8.1)

## 2020-01-23 LAB — RESP PANEL BY RT-PCR (FLU A&B, COVID) ARPGX2
Influenza A by PCR: NEGATIVE
Influenza B by PCR: NEGATIVE
SARS Coronavirus 2 by RT PCR: NEGATIVE

## 2020-01-23 LAB — PROTIME-INR
INR: 1.1 (ref 0.8–1.2)
Prothrombin Time: 13.9 seconds (ref 11.4–15.2)

## 2020-01-23 LAB — BRAIN NATRIURETIC PEPTIDE: B Natriuretic Peptide: 694.9 pg/mL — ABNORMAL HIGH (ref 0.0–100.0)

## 2020-01-23 LAB — TSH: TSH: 6.056 u[IU]/mL — ABNORMAL HIGH (ref 0.350–4.500)

## 2020-01-23 MED ORDER — OXYCODONE-ACETAMINOPHEN 5-325 MG PO TABS
1.0000 | ORAL_TABLET | Freq: Three times a day (TID) | ORAL | Status: DC | PRN
  Administered 2020-01-24 – 2020-01-31 (×7): 1 via ORAL
  Filled 2020-01-23 (×9): qty 1

## 2020-01-23 MED ORDER — IPRATROPIUM-ALBUTEROL 0.5-2.5 (3) MG/3ML IN SOLN
3.0000 mL | Freq: Once | RESPIRATORY_TRACT | Status: AC
  Administered 2020-01-23: 3 mL via RESPIRATORY_TRACT
  Filled 2020-01-23: qty 3

## 2020-01-23 MED ORDER — CHLORHEXIDINE GLUCONATE CLOTH 2 % EX PADS
6.0000 | MEDICATED_PAD | Freq: Every day | CUTANEOUS | Status: DC
  Administered 2020-01-26 – 2020-02-01 (×7): 6 via TOPICAL
  Filled 2020-01-23: qty 6

## 2020-01-23 MED ORDER — INSULIN GLARGINE 100 UNIT/ML ~~LOC~~ SOLN
45.0000 [IU] | Freq: Every day | SUBCUTANEOUS | Status: DC
  Administered 2020-01-24 – 2020-01-26 (×3): 45 [IU] via SUBCUTANEOUS
  Filled 2020-01-23 (×6): qty 0.45

## 2020-01-23 MED ORDER — INSULIN GLARGINE 100 UNITS/ML SOLOSTAR PEN
45.0000 [IU] | PEN_INJECTOR | Freq: Every day | SUBCUTANEOUS | Status: DC
  Filled 2020-01-23: qty 3

## 2020-01-23 MED ORDER — ATORVASTATIN CALCIUM 20 MG PO TABS
40.0000 mg | ORAL_TABLET | Freq: Every day | ORAL | Status: DC
  Administered 2020-01-24 – 2020-02-01 (×7): 40 mg via ORAL
  Filled 2020-01-23 (×8): qty 2

## 2020-01-23 MED ORDER — WARFARIN SODIUM 3 MG PO TABS
3.0000 mg | ORAL_TABLET | Freq: Once | ORAL | Status: DC
  Filled 2020-01-23: qty 1

## 2020-01-23 MED ORDER — OXYCODONE HCL 5 MG PO TABS
5.0000 mg | ORAL_TABLET | Freq: Three times a day (TID) | ORAL | Status: DC | PRN
  Administered 2020-01-24 – 2020-01-29 (×5): 5 mg via ORAL
  Filled 2020-01-23 (×5): qty 1

## 2020-01-23 MED ORDER — LIDOCAINE-PRILOCAINE 2.5-2.5 % EX CREA
1.0000 "application " | TOPICAL_CREAM | CUTANEOUS | Status: DC | PRN
  Filled 2020-01-23: qty 5

## 2020-01-23 MED ORDER — LIDOCAINE HCL (PF) 1 % IJ SOLN
5.0000 mL | INTRAMUSCULAR | Status: DC | PRN
  Filled 2020-01-23: qty 5

## 2020-01-23 MED ORDER — MIDODRINE HCL 5 MG PO TABS
10.0000 mg | ORAL_TABLET | ORAL | Status: DC
  Administered 2020-01-26 – 2020-01-31 (×3): 10 mg via ORAL
  Filled 2020-01-23 (×4): qty 2

## 2020-01-23 MED ORDER — WARFARIN - PHARMACIST DOSING INPATIENT
Freq: Every day | Status: DC
  Filled 2020-01-23: qty 1

## 2020-01-23 MED ORDER — LEVOTHYROXINE SODIUM 88 MCG PO TABS
88.0000 ug | ORAL_TABLET | Freq: Every day | ORAL | Status: DC
  Administered 2020-01-24 – 2020-02-01 (×8): 88 ug via ORAL
  Filled 2020-01-23 (×12): qty 1

## 2020-01-23 MED ORDER — OXYCODONE-ACETAMINOPHEN 5-325 MG PO TABS
1.0000 | ORAL_TABLET | Freq: Three times a day (TID) | ORAL | Status: DC | PRN
Start: 2020-01-23 — End: 2020-01-23

## 2020-01-23 MED ORDER — OXYCODONE HCL 5 MG PO TABS
5.0000 mg | ORAL_TABLET | Freq: Three times a day (TID) | ORAL | Status: DC | PRN
Start: 2020-01-23 — End: 2020-01-23

## 2020-01-23 MED ORDER — ALBUMIN HUMAN 25 % IV SOLN
25.0000 g | Freq: Once | INTRAVENOUS | Status: AC
  Administered 2020-01-23: 25 g via INTRAVENOUS
  Filled 2020-01-23: qty 100

## 2020-01-23 MED ORDER — OXYCODONE HCL 5 MG PO TABS
5.0000 mg | ORAL_TABLET | Freq: Once | ORAL | Status: DC
  Filled 2020-01-23: qty 1

## 2020-01-23 MED ORDER — FUROSEMIDE 10 MG/ML IJ SOLN
60.0000 mg | Freq: Once | INTRAMUSCULAR | Status: AC
  Administered 2020-01-23: 60 mg via INTRAVENOUS
  Filled 2020-01-23: qty 8

## 2020-01-23 MED ORDER — ONDANSETRON HCL 4 MG/2ML IJ SOLN: 4.0000 mg | Freq: Four times a day (QID) | INTRAMUSCULAR | Status: DC | PRN

## 2020-01-23 MED ORDER — AMIODARONE HCL 200 MG PO TABS
200.0000 mg | ORAL_TABLET | Freq: Every day | ORAL | Status: DC
  Administered 2020-01-25 – 2020-02-01 (×7): 200 mg via ORAL
  Filled 2020-01-23 (×9): qty 1

## 2020-01-23 MED ORDER — PENTAFLUOROPROP-TETRAFLUOROETH EX AERO
1.0000 "application " | INHALATION_SPRAY | CUTANEOUS | Status: DC | PRN
  Filled 2020-01-23: qty 30

## 2020-01-23 MED ORDER — OXYCODONE-ACETAMINOPHEN 5-325 MG PO TABS: 1.0000 | ORAL_TABLET | Freq: Three times a day (TID) | ORAL | Status: DC | PRN

## 2020-01-23 MED ORDER — ONDANSETRON HCL 4 MG PO TABS: 4.0000 mg | ORAL_TABLET | Freq: Four times a day (QID) | ORAL | Status: DC | PRN

## 2020-01-23 MED ORDER — INSULIN GLARGINE 100 UNIT/ML ~~LOC~~ SOLN
45.0000 [IU] | Freq: Every day | SUBCUTANEOUS | Status: DC
  Filled 2020-01-23: qty 0.45

## 2020-01-23 MED ORDER — DULOXETINE HCL 30 MG PO CPEP
30.0000 mg | ORAL_CAPSULE | Freq: Every day | ORAL | Status: DC
  Administered 2020-01-24 – 2020-02-01 (×7): 30 mg via ORAL
  Filled 2020-01-23 (×9): qty 1

## 2020-01-23 MED ORDER — ALTEPLASE 2 MG IJ SOLR
2.0000 mg | Freq: Once | INTRAMUSCULAR | Status: DC | PRN
  Filled 2020-01-23: qty 2

## 2020-01-23 MED ORDER — HEPARIN SODIUM (PORCINE) 1000 UNIT/ML DIALYSIS
1000.0000 [IU] | INTRAMUSCULAR | Status: DC | PRN
  Filled 2020-01-23: qty 1

## 2020-01-23 MED ORDER — POLYETHYLENE GLYCOL 3350 17 G PO PACK
17.0000 g | PACK | Freq: Every day | ORAL | Status: DC | PRN
  Administered 2020-01-26: 17 g via ORAL
  Filled 2020-01-23: qty 1

## 2020-01-23 MED ORDER — SODIUM CHLORIDE 0.9 % IV SOLN: 100.0000 mL | INTRAVENOUS | Status: DC | PRN

## 2020-01-23 MED ORDER — VITAMIN D (ERGOCALCIFEROL) 1.25 MG (50000 UNIT) PO CAPS
50000.0000 [IU] | ORAL_CAPSULE | ORAL | Status: DC
  Administered 2020-02-01: 09:00:00 50000 [IU] via ORAL
  Filled 2020-01-23 (×3): qty 1

## 2020-01-23 NOTE — H&P (Addendum)
History and Physical   [name] JJH:[id number] DOB: [birth year] DOA: 01/23/2020  PCP: Alicia Hector, MD  Outpatient Specialists: [facility] and [facility] Patient coming from: home via EMS  I have personally briefly reviewed patient's old medical records in [place].  Chief Concern: shortness of breath   HPI: [name] is a 69 y.o. female with medical history significant for insulin-dependent diabetes mellitus type 2, heart failure with reduced ejection fraction LVEF of 45%, end-stage renal disease on hemodialysis, obstructive sleep apnea not wearing CPAP, hypertension, aortic atherosclerosis, persistent atrial fibrillation, status post TAVR, morbid obesity, acquired hypothyroid, history of focal segmental glomerulosclerosis, presented to the emergency department via EMS for chief concerns of shortness of breath.  Shortness of breath that started progressively worsened throughout the day 01/22/20. Daughter denies fever, nausea, vomiting, chest pain, abdominal pain, dysuria, hematuria, diarrhea, blood in her stool. Patient and daughter states that she used her home oxygen without much relief.  Family endorses new increased productive cough with yellow mucus throughout the day. Endorses increased swelling in her legs.   ED Course: Discussed with ED provider, requesting admission for shortness of breath secondary to volume overload requiring HD.   Review of Systems: As per HPI otherwise 10 point review of systems negative.  Assessment/Plan  Principal Problem:   Acute hypoxemic respiratory failure (HCC) Active Problems:   Obesity   Anemia   Benign essential HTN   Acquired atrophy of thyroid   FSGS (focal segmental glomerulosclerosis)   Acute kidney injury superimposed on chronic kidney disease (HCC)   S/P TAVR (transcatheter aortic valve replacement)   DM type 2 with diabetic peripheral neuropathy (HCC)   Acute respiratory failure with hypoxia  (HCC)   DDD (degenerative disc disease), lumbosacral   OSA (obstructive sleep apnea)   Acute hypoxemic respiratory failure suspect secondary to volume overload in setting of ESRD on HD, OSA not on cpap - s/p lasix 60 mg IV per ED provider - Improving on bipap - Bipap continued   ESRD on HD  - Patient is compliance with HD sessions and limitations of PO intake.  - Daughter states patient consumes 2-3 (4 oz volume each) of liquids per day (this includes water, sodas, teas, coffee) - Nephrology has been consulted - Query qualification for 4x/week HD session and Dr. Inocente Salles has offered to increase her HD sessions to 4x/week in the past and patient declined it at the time - Patient and daughter at bedside states she is happy to help take her mother to HD 4x/week if it is available to patient   HFrEF -Complete echo on 11/19/2019 showed EF of 25%, mild LVH concentric with severe global decrease, thickened mitral leaflet, -Consulted with cardiology, Dr. Clayborn Bigness would via secure chat regarding heart failure reduced ejection fraction medication optimization including Entresto and or SGLT2 inhibitor and regarding positive delta troponin  Elevated troponin - suspect demand ischemia in setting of volume overload, however as troponin HS has positive delta, cardiology has been consulted via secure chat  OSA - was not able to tolerate recommended auto CPAP - Patient and daughter is open to pulmonology referral for either refitting of a cpap mask - Recommend pulmonology referral on discharge and if her insurance does not qualify, recommend PCP to refer patient for refitting of CPAP as she and daughter endorses compliance with cpap use now  IDDM - on insulin   Persistent atrial fibrillation - previously on eliquis, now on warfarin due to history of  aortic valve thrombosis  S/p TAVR - on warfarin - Checking INR - Pharmacy warfarin management consult place  Chart reviewed.   08/2018 - patient had  sleep study and recommendation was auto CPAP, however daughter states that at the time, the mask given to hear did not fit well, and patient could not tolerate the mask and insurance did not resend another mask  Complete echo on 11/19/2019 showed EF of 25%, mild LVH concentric with severe global decrease, thickened mitral leaflet,  [facility] admission on 12/13/19 - 12/22/19: History of thrombosis of aortic valve s/p TAVR; now on warfarin  DVT prophylaxis: on warfarin Code Status: full code Diet: diabetic/cardiac Family Communication: Discussed with daughter, Museum/gallery conservator at bedside  Disposition Plan: pending clinical course Consults called: nephrology Admission status: observation to progressive cardiac  Past Medical History:  Diagnosis Date  . Anemia   . Anxiety   . Aortic valve stenosis   . Arrhythmia    atrial fibrillation  . Arthritis    feet, legs  . B12 deficiency   . Bowel obstruction ([place])   . CHF (congestive heart failure) ([place])   . CKD (chronic kidney disease)    protein in urine  . Colostomy in place [facility])   . Diabetes mellitus without complication ([place])   . Dialysis patient [facility])    T/Th/Sa  . Diverticulitis large intestine   . Dysrhythmia   . Ectopic atrial tachycardia ([place])   . FSGS (focal segmental glomerulosclerosis)   . Gastritis   . GI bleed   . Heart murmur   . Hyperlipidemia   . Hypertension   . Hypothyroidism   . Hypothyroidism    unspecified  . MRSA (methicillin resistant Staphylococcus aureus)    at abdominal wound.  Jan 2017.  Treated.   . Neuropathy   . Neuropathy in diabetes ([place])   . Obesity   . Pedal edema   . Psoriasis   . Shortness of breath dyspnea   . Sleep apnea    CPAP  . Vertigo    Past Surgical History:  Procedure Laterality Date  . ABDOMINAL HYSTERECTOMY    . APPLICATION OF WOUND VAC N/A 02/24/2015   Procedure: APPLICATION OF WOUND VAC;  Surgeon: Clayburn Pert, MD;  Location: [facility] ORS;  Service: General;  Laterality: N/A;  .  APPLICATION OF WOUND VAC N/A 02/26/2015   Procedure: APPLICATION OF WOUND VAC;  Surgeon: Clayburn Pert, MD;  Location: [facility];  Service: General;  Laterality: N/A;  . AV FISTULA PLACEMENT Left 01/29/2019   Procedure: ARTERIOVENOUS (AV) FISTULA CREATION ( BRACHIAL CEPHALIC);  Surgeon: Katha Cabal, MD;  Location: [facility] OR[facility];  Service: Vascular;  Laterality: Left;  . BACK SURGERY     spur frmoved from lower back  . CARDIAC CATHETERIZATION    . CARDIAC VALVE REPLACEMENT  08/2017   [facility]  . CARDIOVERSION N/A 07/15/2018   Procedure: CARDIOVERSION;  Surgeon: Corey Skains, MD;  Location: [facility];  Service: Cardiovascular;  Laterality: N/A;  . CATARACT EXTRACTION W/PHACO Right 11/16/2018   Procedure: CATARACT EXTRACTION PHACO AND INTRAOCULAR LENS PLACEMENT (IOC) RIGHT DIABETiC  01:18.3  13.5%  10.82;  Surgeon: Eulogio Bear, MD;  Location: [place];  Service: Ophthalmology;  Laterality: Right;  Diabetic - insulin sleep apnea  . CATARACT EXTRACTION W/PHACO Left 12/07/2018   Procedure: CATARACT EXTRACTION PHACO AND INTRAOCULAR LENS PLACEMENT (IOC) LEFT DIABETIC 01:02.6  8.8%  5.77;  Surgeon: Eulogio Bear, MD;  Location: [place];  Service: Ophthalmology;  Laterality:  Left;  Diabetic - insulin  . CHOLECYSTECTOMY    . COLECTOMY    . COLONOSCOPY WITH PROPOFOL N/A 09/04/2016   Procedure: COLONOSCOPY WITH PROPOFOL;  Surgeon: Manya Silvas, MD;  Location: [facility] ENDOSCOPY;  Service: Endoscopy;  Laterality: N/A;  . COLOSTOMY REVERSAL N/A 07/12/2015   Procedure: COLOSTOMY REVERSAL;  Surgeon: Clayburn Pert, MD;  Location: [facility];  Service: General;  Laterality: N/A;  . DEBRIDEMENT OF ABDOMINAL WALL ABSCESS N/A 02/22/2015   Procedure: DEBRIDEMENT OF ABDOMINAL WALL ABSCESS;  Surgeon: Clayburn Pert, MD;  Location: [facility];  Service: General;  Laterality: N/A;  . DIALYSIS/PERMA CATHETER INSERTION N/A 10/20/2018   Procedure: DIALYSIS/PERMA CATHETER INSERTION;  Surgeon:  Katha Cabal, MD;  Location: [place] [facility];  Service: Cardiovascular;  Laterality: N/A;  . DIALYSIS/PERMA CATHETER REMOVAL N/A 08/03/2019   Procedure: DIALYSIS/PERMA CATHETER REMOVAL;  Surgeon: Katha Cabal, MD;  Location: [place] [facility];  Service: Cardiovascular;  Laterality: N/A;  . EXCISION MASS ABDOMINAL N/A 02/24/2015   Procedure: EXCISION MASS ABDOMINAL  / [place];  Surgeon: Clayburn Pert, MD;  Location: [facility];  Service: General;  Laterality: N/A;  . EXCISION MASS ABDOMINAL N/A 02/26/2015   Procedure: EXCISION MASS ABDOMINAL/wash out;  Surgeon: Clayburn Pert, MD;  Location: [facility];  Service: General;  Laterality: N/A;  . FLEXIBLE SIGMOIDOSCOPY  06/19/2015   Procedure: FLEXIBLE SIGMOIDOSCOPY;  Surgeon: Lucilla Lame, MD;  Location: [place];  Service: Endoscopy;;  UNABLE TO ACCESS OSTOMY SITE FOR ACCESS INTO COLON  . HERNIA REPAIR     umbilical  . LAPAROSCOPY  07/12/2015   Procedure: LAPAROSCOPY DIAGNOSTIC;  Surgeon: Clayburn Pert, MD;  Location: [facility];  Service: General;;  . LAPAROTOMY  07/12/2015   Procedure: EXPLORATORY LAPAROTOMY;  Surgeon: Clayburn Pert, MD;  Location: [facility];  Service: General;;  . LAPAROTOMY N/A 02/06/2015   Procedure: Laparotomy, reduction of incarcerated parastomal hernia, repair of parastomal hernia with mesh;  Surgeon: Sherri Rad, MD;  Location: [facility];  Service: General;  Laterality: N/A;  . laparotomy closure of cecal perforation  05/09/2013   Dr. Marina Gravel  . LYSIS OF ADHESION  07/12/2015   Procedure: LYSIS OF ADHESION;  Surgeon: Clayburn Pert, MD;  Location: [facility];  Service: General;;  . TONSILLECTOMY    . WOUND DEBRIDEMENT N/A 03/03/2015   Procedure: DEBRIDEMENT ABDOMINAL WOUND;  Surgeon: Florene Glen, MD;  Location: [facility];  Service: General;  Laterality: N/A;  . WOUND DEBRIDEMENT N/A 03/09/2015   Procedure: DEBRIDEMENT ABDOMINAL WOUND;  Surgeon: Florene Glen, MD;  Location: [facility];  Service:  General;  Laterality: N/A;   Social History:  reports that she quit smoking about 6 years ago. Her smoking use included cigarettes. She has never used smokeless tobacco. She reports that she does not drink alcohol and does not use drugs.  Allergies  Allergen Reactions  . Buspirone Other (See Comments)    Weakness  . Diltiazem Hcl Palpitations  . Gabapentin Palpitations  . Hydralazine Rash  . Lisinopril Cough  . Lovastatin Palpitations  . Metformin Diarrhea  . Pravastatin Other (See Comments)    Insomnia  . Sitagliptin Other (See Comments)    Constipation  . Tramadol Itching   Family History  Problem Relation Age of Onset  . Diabetes Mother   . Hypertension Father   . CAD Father   . Heart attack Father   . Colon cancer Brother    Family history: Family history reviewed and not pertinent  Prior to  Admission medications   Medication Sig Start Date End Date Taking? Authorizing Provider  amiodarone (PACERONE) 200 MG tablet Take 200 mg by mouth daily.  06/29/18   [provider]  apixaban (ELIQUIS) 5 MG TABS tablet Take 5 mg by mouth 2 (two) times daily.     [provider]  atorvastatin (LIPITOR) 40 MG tablet Take 40 mg by mouth daily.     [provider]  DULoxetine (CYMBALTA) 30 MG capsule Take 30 mg by mouth daily.  01/18/15   [provider]  DUPIXENT 300 MG/2ML SOSY Inject 300 mg into the skin every 14 (fourteen) days. 05/28/18   [provider]  HUMALOG KWIKPEN 100 UNIT/ML KwikPen Inject 0.22-0.38 mLs (22-38 Units total) into the skin See admin instructions. Inject 22u under the skin before breakfast and inject 38u under the skin before supper 09/10/19   Jennye Boroughs, MD  insulin glargine (LANTUS) 100 unit/mL SOPN Inject 45 Units into the skin at bedtime.     [provider]  levothyroxine (SYNTHROID, LEVOTHROID) 88 MCG tablet Take 88 mcg by mouth daily before breakfast.  09/11/15   [provider]  oxyCODONE (OXY  IR/ROXICODONE) 5 MG immediate release tablet Take 1 tablet (5 mg total) by mouth every 8 (eight) hours as needed for severe pain. Must last 30 days 10/01/19 10/31/19  Milinda Pointer, MD  oxyCODONE (OXY IR/ROXICODONE) 5 MG immediate release tablet Take 1 tablet (5 mg total) by mouth every 8 (eight) hours as needed for severe pain. Must last 30 days 10/31/19 11/30/19  Milinda Pointer, MD  oxyCODONE (OXY IR/ROXICODONE) 5 MG immediate release tablet Take 1 tablet (5 mg total) by mouth every 8 (eight) hours as needed for severe pain. Must last 30 days 11/30/19 12/30/19  Milinda Pointer, MD  oxyCODONE-acetaminophen (PERCOCET) 5-325 MG tablet Take 1 tablet by mouth every 8 (eight) hours as needed for severe pain. Must last 30 days. 12/27/19 01/26/20  Gillis Santa, MD  polyethylene glycol (MIRALAX / GLYCOLAX) 17 g packet Take 17 g by mouth daily as needed for moderate constipation.    [provider]  torsemide (DEMADEX) 20 MG tablet Take 40 mg by mouth daily.     [provider]  Vitamin D, Ergocalciferol, (DRISDOL) 1.25 MG (50000 UT) CAPS capsule Take 50,000 Units by mouth every Tuesday.    [provider]   Physical Exam: Vitals:   01/23/20 1127 01/23/20 1128 01/23/20 1200 01/23/20 1400  BP:  (!) 140/99 114/86 120/81  Pulse:   (!) 113   Resp:   (!) 25 20  Temp:      TempSrc:      SpO2:   100% 100%  Weight: 104.3 kg     Height: 5\' 3"  (1.6 m)      Constitutional: appears older than chronological age, NAD on BiPAP, calm, comfortable Eyes: PERRL, lids and conjunctivae normal ENMT: Mucous membranes are moist. Posterior pharynx clear of any exudate or lesions. Age-appropriate dentition. Hearing appropriate Neck: normal, supple, no masses, no thyromegaly Respiratory: clear to auscultation bilaterally, no wheezing, no crackles. Normal respiratory effort. No accessory muscle use.  Cardiovascular: Regular rate and rhythm, no murmurs / rubs / gallops. No extremity edema. 2+  pedal pulses. No carotid bruits.  Abdomen: Morbidly obese, no tenderness, no masses palpated, no hepatosplenomegaly. Bowel sounds positive.  Musculoskeletal: no clubbing / cyanosis. No joint deformity upper and lower extremities. Good ROM, no contractures, no atrophy. Normal muscle tone.  Skin: no rashes, lesions, ulcers. No induration  Neurologic: Sensation intact. Strength 5/5 in all 4.  Psychiatric: Normal judgment and insight. Alert and oriented x 3. Normal mood.   EKG: Independently reviewed, showing ectopic atrial tachycardia with rate of 113, qtc 528 relatively unchanged from previous EKG 09/07/19  Chest x-ray on Admission: Personally reviewed and I agree with radiologist reading as below.  DG Chest Portable 1 View  Result Date: 01/23/2020 CLINICAL DATA:  Increasing shortness of breath. History of CHF and hypertension. EXAM: PORTABLE CHEST 1 VIEW COMPARISON:  09/07/2019 FINDINGS: Cardiac silhouette is mildly enlarged. Prosthetic endovascular aortic valve. No mediastinal or hilar masses. Lungs demonstrate vascular congestion with mild interstitial prominence similar to the prior radiograph. No lung consolidation. No convincing pleural effusion and no pneumothorax. Stable surgical vascular clips project over the left upper hemithorax. Previous cervical spine fusion, stable. Skeletal structures are grossly intact. IMPRESSION: 1. Cardiomegaly vascular congestion and mild interstitial prominence. Suspect mild congestive heart failure. No evidence of pneumonia. Electronically Signed   By: Lajean Manes M.D.   On: 01/23/2020 12:19   Labs on Admission: I have personally reviewed following labs  CBC: Recent Labs  Lab 01/23/20 1143  WBC 9.6  HGB 11.3*  HCT 37.6  MCV 98.7  PLT 753   Basic Metabolic Panel: Recent Labs  Lab 01/23/20 1143  NA 135  K 4.5  CL 95*  CO2 28  GLUCOSE 214*  BUN 41*  CREATININE 6.26*  CALCIUM 8.8*   GFR: Estimated Creatinine Clearance: 9.8 mL/min (A) (by C-G  formula based on SCr of 6.26 mg/dL (H)). Liver Function Tests: Recent Labs  Lab 01/23/20 1143  AST 22  ALT 18  ALKPHOS 58  BILITOT 0.9  PROT 7.5  ALBUMIN 3.8   Urine analysis:    Component Value Date/Time   COLORURINE YELLOW (A) 06/02/2019 2016   APPEARANCEUR CLEAR (A) 06/02/2019 2016   APPEARANCEUR Hazy 05/08/2013 1410   LABSPEC 1.005 06/02/2019 2016   LABSPEC 1.006 05/08/2013 1410   PHURINE 6.0 06/02/2019 2016   GLUCOSEU NEGATIVE 06/02/2019 2016   GLUCOSEU 50 mg/dL 05/08/2013 1410   HGBUR NEGATIVE 06/02/2019 2016   BILIRUBINUR NEGATIVE 06/02/2019 2016   BILIRUBINUR Negative 05/08/2013 [place] NEGATIVE 06/02/2019 2016   PROTEINUR NEGATIVE 06/02/2019 2016   NITRITE NEGATIVE 06/02/2019 2016   LEUKOCYTESUR TRACE (A) 06/02/2019 2016   LEUKOCYTESUR 1+ 05/08/2013 1410   Jerric Oyen N Zamauri Nez D.O. [facility]  If 12AM-7AM, please contact overnight-coverage provider If 7AM-7PM, please contact day coverage provider [email or web address]  01/23/2020, 2:53 PM

## 2020-01-23 NOTE — ED Notes (Addendum)
Pt presents to ED with increasing SOB over the past 1-2 months. Pt states taking a z-pack at this time and states today would be her last dose, pt is unsure of why she is taking the ABX. Pt states a HX of CHF but cannot tell this RN if she has had any weight gain recently, pt is morbidly obese, slightly swelling to BLE. Pt has exp wheezing throughout all lobes. Pt is A&Ox4. Pt also states taking warfarin for a PE, pt states that was founded about 1 month ago. Pt is currently on a nonrebreather at 15L/min. Pt is using accessory muscles to breath and has difficulty speaking in full sentences at this time. Pt is A&Ox4. Pt is a dialysis pt and has a noticeable AV fistula to L arm. Pt states dialysis last on Friday and states she has not missed any current dialysis routine. Pt appears to be in resp distress at this time. EMS IV removed due to it being placed in fistula arm. New access obtained. EMS gave 125 IVP solumedrol PTA.

## 2020-01-23 NOTE — ED Notes (Signed)
Patient sleeping comfortably, respirations even and unlabored. NAD noted, bed low and locked. Call bell in reach

## 2020-01-23 NOTE — ED Provider Notes (Signed)
[facility] Emergency Department Provider Note  Time seen: 11:51 AM  I have reviewed the triage vital signs and the nursing notes.   HISTORY  Chief Complaint Shortness of Breath   HPI [name] is a 69 y.o. female with a past medical history of anemia, anxiety, diabetes, CHF, CKD, hypertension, obesity, presents to the emergency department  for shortness of breath.  According to the patient for the past several days she has had progressive worsening shortness of breath became acutely worse since last night.  Patient currently on nonrebreather mask satting 93 to 95%.  States she wears 3 L of oxygen typically.  States mild fluid accumulation in the legs.  Denies any fever.  States cough but no significant sputum production.  No chest pain.  Past Medical History:  Diagnosis Date  . Anemia   . Anxiety   . Aortic valve stenosis   . Arrhythmia    atrial fibrillation  . Arthritis    feet, legs  . B12 deficiency   . Bowel obstruction ([place])   . CHF (congestive heart failure) ([place])   . CKD (chronic kidney disease)    protein in urine  . Colostomy in place [facility])   . Diabetes mellitus without complication ([place])   . Dialysis patient [facility])    T/Th/Sa  . Diverticulitis large intestine   . Dysrhythmia   . Ectopic atrial tachycardia ([place])   . FSGS (focal segmental glomerulosclerosis)   . Gastritis   . GI bleed   . Heart murmur   . Hyperlipidemia   . Hypertension   . Hypothyroidism   . Hypothyroidism    unspecified  . MRSA (methicillin resistant Staphylococcus aureus)    at abdominal wound.  Jan 2017.  Treated.   . Neuropathy   . Neuropathy in diabetes ([place])   . Obesity   . Pedal edema   . Psoriasis   . Shortness of breath dyspnea   . Sleep apnea    CPAP  . Vertigo     Patient Active Problem List   Diagnosis Date Noted  . Complication from renal dialysis device 10/12/2019  . Acute on chronic diastolic CHF (congestive heart failure) ([place])  09/07/2019  . Pharmacologic therapy 06/30/2019  . Chronic respiratory failure with hypoxia ([place]) 06/22/2019  . Hypoxia 06/10/2019  . Spondylosis without myelopathy or radiculopathy, lumbosacral region 11/05/2018  . DDD (degenerative disc disease), lumbosacral 11/05/2018  . Hematochezia 09/03/2018  . Hemorrhoids, internal 09/03/2018  . Proctitis 09/03/2018  . Obstructive sleep apnea 08/24/2018  . Acute respiratory failure with hypoxia ([place]) 07/12/2018  . DM type 2 with diabetic peripheral neuropathy ([place]) 11/25/2017  . CKD (chronic kidney disease) stage 5, GFR less than 15 ml/min (HCC) 10/05/2017  . 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 Acute kidney injury superimposed on chronic kidney disease ([place]) 09/01/2017  . Chronic systolic CHF (congestive heart failure) ([place]) 08/19/2017  . Paroxysmal A-fib ([place]) 08/14/2017  . Cervical myelopathy ([place]) 08/07/2017  . Onychomycosis of multiple toenails with type 2 diabetes mellitus and peripheral neuropathy ([place]) 06/23/2017  . Myofascial pain 05/27/2017  . Chronic pain of left upper extremity 05/27/2017  . Morbid obesity ([place]) 05/06/2017  . Uncontrolled type 2 diabetes mellitus with hyperglycemia, with long-term current use of insulin ([place]) 02/07/2017  . Hyperlipidemia, mixed 08/08/2016  . Hx of adenomatous colonic polyps 06/14/2016  . 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 Chronic pain syndrome 03/11/2016  . Obesity, Class II, BMI 35-39.9 03/11/2016  .  Osteopenia of multiple sites 01/26/2016  . Heart burn 12/18/2015  . Polyneuropathy 11/16/2015  . Lumbar spondylosis 09/25/2015  . Lumbar facet syndrome (Bilateral) (L>R) 09/25/2015  . Recurrent major depressive disorder, in full remission ([place]) 09/19/2015  . Diabetic neuropathy ([place]) 09/06/2015  . H/O colostomy 07/12/2015  . Long term current use of opiate analgesic 07/03/2015  . Long term prescription opiate use 07/03/2015  . Opiate use (22.5  MME/Day) 07/03/2015  . Encounter for therapeutic drug level monitoring 07/03/2015  . Encounter for pain management planning 07/03/2015  . Neurogenic pain 07/03/2015  . Chronic musculoskeletal pain 07/03/2015  . Visceral abdominal pain 07/03/2015  . Diabetic peripheral neuropathy ([place]) 07/03/2015  . Chronic low back pain (Primary Area of Pain) (Bilateral) (L>R) 07/03/2015  . Chronic lower extremity pain (Secondary area of Pain) (Bilateral) (R>L) 07/03/2015  . Chronic hip pain (Bilateral) (L>R) 07/03/2015  . Chronic neck pain (Bilateral) (L>R) 07/03/2015  . Anemia 02/16/2015  . Arthritis 02/16/2015  . End stage renal disease ([place]) 02/16/2015  . Focal and segmental hyalinosis 02/16/2015  . Bleeding gastrointestinal 02/16/2015  . Acquired atrophy of thyroid 02/16/2015  . Psoriasis 02/16/2015  . Small bowel obstruction ([place]) 02/04/2015  . Severe aortic stenosis 10/11/2014  . Paroxysmal supraventricular tachycardia ([place]) 10/11/2014  . SOBOE (shortness of breath on exertion) 10/11/2014  . Benign essential HTN 10/04/2014  . Obesity 09/05/2014  . B12 deficiency 03/02/2014  . Diabetes mellitus type 2 in obese ([place]) 12/01/2013  . Abnormal presence of protein in urine 10/15/2010    Past Surgical History:  Procedure Laterality Date  . ABDOMINAL HYSTERECTOMY    . APPLICATION OF WOUND VAC N/A 02/24/2015   Procedure: APPLICATION OF WOUND VAC;  Surgeon: Clayburn Pert, MD;  Location: [facility] ORS;  Service: General;  Laterality: N/A;  . APPLICATION OF WOUND VAC N/A 02/26/2015   Procedure: APPLICATION OF WOUND VAC;  Surgeon: Clayburn Pert, MD;  Location: [facility];  Service: General;  Laterality: N/A;  . AV FISTULA PLACEMENT Left 01/29/2019   Procedure: ARTERIOVENOUS (AV) FISTULA CREATION ( BRACHIAL CEPHALIC);  Surgeon: Katha Cabal, MD;  Location: [facility];  Service: Vascular;  Laterality: Left;  . BACK SURGERY     spur frmoved from lower back  . CARDIAC CATHETERIZATION    . CARDIAC VALVE  REPLACEMENT  08/2017   [facility]  . CARDIOVERSION N/A 07/15/2018   Procedure: CARDIOVERSION;  Surgeon: Corey Skains, MD;  Location: [facility];  Service: Cardiovascular;  Laterality: N/A;  . CATARACT EXTRACTION W/PHACO Right 11/16/2018   Procedure: CATARACT EXTRACTION PHACO AND INTRAOCULAR LENS PLACEMENT (IOC) RIGHT DIABETiC  01:18.3  13.5%  10.82;  Surgeon: Eulogio Bear, MD;  Location: [place];  Service: Ophthalmology;  Laterality: Right;  Diabetic - insulin sleep apnea  . CATARACT EXTRACTION W/PHACO Left 12/07/2018   Procedure: CATARACT EXTRACTION PHACO AND INTRAOCULAR LENS PLACEMENT (IOC) LEFT DIABETIC 01:02.6  8.8%  5.77;  Surgeon: Eulogio Bear, MD;  Location: [place];  Service: Ophthalmology;  Laterality: Left;  Diabetic - insulin  . CHOLECYSTECTOMY    . COLECTOMY    . COLONOSCOPY WITH PROPOFOL N/A 09/04/2016   Procedure: COLONOSCOPY WITH PROPOFOL;  Surgeon: Manya Silvas, MD;  Location: [facility] ENDOSCOPY;  Service: Endoscopy;  Laterality: N/A;  . COLOSTOMY REVERSAL N/A 07/12/2015   Procedure: COLOSTOMY REVERSAL;  Surgeon: Clayburn Pert, MD;  Location: [facility];  Service: General;  Laterality: N/A;  . DEBRIDEMENT OF ABDOMINAL WALL ABSCESS N/A 02/22/2015   Procedure: DEBRIDEMENT OF ABDOMINAL WALL  ABSCESS;  Surgeon: Clayburn Pert, MD;  Location: [facility];  Service: General;  Laterality: N/A;  . DIALYSIS/PERMA CATHETER INSERTION N/A 10/20/2018   Procedure: DIALYSIS/PERMA CATHETER INSERTION;  Surgeon: Katha Cabal, MD;  Location: [place] CV LAB;  Service: Cardiovascular;  Laterality: N/A;  . DIALYSIS/PERMA CATHETER REMOVAL N/A 08/03/2019   Procedure: DIALYSIS/PERMA CATHETER REMOVAL;  Surgeon: Katha Cabal, MD;  Location: [place] CV LAB;  Service: Cardiovascular;  Laterality: N/A;  . EXCISION MASS ABDOMINAL N/A 02/24/2015   Procedure: EXCISION MASS ABDOMINAL  / [place];  Surgeon: Clayburn Pert, MD;  Location: [facility];  Service: General;   Laterality: N/A;  . EXCISION MASS ABDOMINAL N/A 02/26/2015   Procedure: EXCISION MASS ABDOMINAL/wash out;  Surgeon: Clayburn Pert, MD;  Location: [facility];  Service: General;  Laterality: N/A;  . FLEXIBLE SIGMOIDOSCOPY  06/19/2015   Procedure: FLEXIBLE SIGMOIDOSCOPY;  Surgeon: Lucilla Lame, MD;  Location: [place];  Service: Endoscopy;;  UNABLE TO ACCESS OSTOMY SITE FOR ACCESS INTO COLON  . HERNIA REPAIR     umbilical  . LAPAROSCOPY  07/12/2015   Procedure: LAPAROSCOPY DIAGNOSTIC;  Surgeon: Clayburn Pert, MD;  Location: [facility];  Service: General;;  . LAPAROTOMY  07/12/2015   Procedure: EXPLORATORY LAPAROTOMY;  Surgeon: Clayburn Pert, MD;  Location: [facility];  Service: General;;  . LAPAROTOMY N/A 02/06/2015   Procedure: Laparotomy, reduction of incarcerated parastomal hernia, repair of parastomal hernia with mesh;  Surgeon: Sherri Rad, MD;  Location: [facility];  Service: General;  Laterality: N/A;  . laparotomy closure of cecal perforation  05/09/2013   Dr. Marina Gravel  . LYSIS OF ADHESION  07/12/2015   Procedure: LYSIS OF ADHESION;  Surgeon: Clayburn Pert, MD;  Location: [facility];  Service: General;;  . TONSILLECTOMY    . WOUND DEBRIDEMENT N/A 03/03/2015   Procedure: DEBRIDEMENT ABDOMINAL WOUND;  Surgeon: Florene Glen, MD;  Location: [facility];  Service: General;  Laterality: N/A;  . WOUND DEBRIDEMENT N/A 03/09/2015   Procedure: DEBRIDEMENT ABDOMINAL WOUND;  Surgeon: Florene Glen, MD;  Location: [facility];  Service: General;  Laterality: N/A;    Prior to Admission medications   Medication Sig Start Date End Date Taking? Authorizing Provider  amiodarone (PACERONE) 200 MG tablet Take 200 mg by mouth daily.  06/29/18   [provider]  apixaban (ELIQUIS) 5 MG TABS tablet Take 5 mg by mouth 2 (two) times daily.     [provider]  atorvastatin (LIPITOR) 40 MG tablet Take 40 mg by mouth daily.     [provider]  DULoxetine (CYMBALTA) 30 MG capsule Take 30  mg by mouth daily.  01/18/15   [provider]  DUPIXENT 300 MG/2ML SOSY Inject 300 mg into the skin every 14 (fourteen) days. 05/28/18   [provider]  HUMALOG KWIKPEN 100 UNIT/ML KwikPen Inject 0.22-0.38 mLs (22-38 Units total) into the skin See admin instructions. Inject 22u under the skin before breakfast and inject 38u under the skin before supper 09/10/19   Jennye Boroughs, MD  insulin glargine (LANTUS) 100 unit/mL SOPN Inject 45 Units into the skin at bedtime.     [provider]  levothyroxine (SYNTHROID, LEVOTHROID) 88 MCG tablet Take 88 mcg by mouth daily before breakfast.  09/11/15   [provider]  oxyCODONE (OXY IR/ROXICODONE) 5 MG immediate release tablet Take 1 tablet (5 mg total) by mouth every 8 (eight) hours as needed for severe pain. Must last 30 days 10/01/19 10/31/19  Milinda Pointer, MD  oxyCODONE (OXY IR/ROXICODONE) 5 MG immediate release tablet Take 1 tablet (5 mg total) by mouth every 8 (eight) hours as needed for severe pain. Must last 30 days 10/31/19 11/30/19  Milinda Pointer, MD  oxyCODONE (OXY IR/ROXICODONE) 5 MG immediate release tablet Take 1 tablet (5 mg total) by mouth every 8 (eight) hours as needed for severe pain. Must last 30 days 11/30/19 12/30/19  Milinda Pointer, MD  oxyCODONE-acetaminophen (PERCOCET) 5-325 MG tablet Take 1 tablet by mouth every 8 (eight) hours as needed for severe pain. Must last 30 days. 12/27/19 01/26/20  Gillis Santa, MD  polyethylene glycol (MIRALAX / GLYCOLAX) 17 g packet Take 17 g by mouth daily as needed for moderate constipation.    [provider]  torsemide (DEMADEX) 20 MG tablet Take 40 mg by mouth daily.     [provider]  Vitamin D, Ergocalciferol, (DRISDOL) 1.25 MG (50000 UT) CAPS capsule Take 50,000 Units by mouth every Tuesday.    [provider]    Allergies  Allergen Reactions  . Buspirone Other (See Comments)    Weakness  . Diltiazem Hcl Palpitations  .  Gabapentin Palpitations  . Hydralazine Rash  . Lisinopril Cough  . Lovastatin Palpitations  . Metformin Diarrhea  . Pravastatin Other (See Comments)    Insomnia  . Sitagliptin Other (See Comments)    Constipation  . Tramadol Itching    Family History  Problem Relation Age of Onset  . Diabetes Mother   . Hypertension Father   . CAD Father   . Heart attack Father   . Colon cancer Brother     Social History Social History   Tobacco Use  . Smoking status: Former Smoker    Types: Cigarettes    Quit date: 04/18/2013    Years since quitting: 6.7  . Smokeless tobacco: Never Used  Vaping Use  . Vaping Use: Never used  Substance Use Topics  . Alcohol use: No    Alcohol/week: 0.0 standard drinks  . Drug use: No    Review of Systems Constitutional: Negative for fever. Cardiovascular: Negative for chest pain. Respiratory: Positive shortness of breath.  Positive for cough. Gastrointestinal: Negative for abdominal pain, vomiting Musculoskeletal: Positive for lower extremity edema Skin: Negative for skin complaints  Neurological: Negative for headache All other ROS negative  ____________________________________________   PHYSICAL EXAM:  VITAL SIGNS: ED Triage Vitals  Enc Vitals Group     BP 01/23/20 1128 (!) 140/99     Pulse Rate 01/23/20 1125 (!) 102     Resp 01/23/20 1125 (!) 26     Temp 01/23/20 1125 (!) 97.4 F (36.3 C)     Temp Source 01/23/20 1125 Axillary     SpO2 01/23/20 1125 95 %     Weight 01/23/20 1127 230 lb (104.3 kg)     Height 01/23/20 1127 5\' 3"  (1.6 m)     Head Circumference --      Peak Flow --      Pain Score 01/23/20 1126 0     Pain Loc --      Pain Edu? --      Excl. in [place]? --    Constitutional: Alert and oriented.  Sitting up in bed with a nonrebreather mask mild distress. Eyes: Normal exam ENT      Head: Normocephalic and atraumatic.      Mouth/Throat: Mucous membranes are moist. Cardiovascular: Regular rhythm rate around 100  bpm. Respiratory: Mild tachypnea with mild bilateral inspiratory and expiratory wheezing.  Gastrointestinal: Soft and nontender. No distention.   Musculoskeletal: Nontender with normal range of motion in all extremities.  Mild/1+ lower extremity edema bilaterally. Neurologic:  Normal speech and language. No gross focal neurologic deficits  Skin:  Skin is warm, dry and intact.  Psychiatric: Mood and affect are normal.   ____________________________________________    EKG  EKG viewed and interpreted by myself shows a sinus tachycardia at 113 bpm with a widened QRS, right axis deviation, nonspecific ST changes.  ____________________________________________    RADIOLOGY  Chest x-ray shows interstitial edema  ____________________________________________   INITIAL IMPRESSION / ASSESSMENT AND PLAN / ED COURSE  Pertinent labs & imaging results that were available during my care of the patient were reviewed by me and considered in my medical decision making (see chart for details).   Patient presents emergency department for shortness of breath acutely worse since last night.  Patient is currently satting 93% on a nonrebreather mask.  States her baseline is 3 L of O2.  Patient has inspiratory and expiratory wheezing bilaterally.  Does have 1+ lower extremity edema as well.  We will treat with a DuoNeb patient received 125 mg of Solu-Medrol by EMS prior to arrival.  Differential would include COPD/reactive airway disease for CHF, pneumonia, infectious etiology.  We will check labs, chest x-ray, Covid swab.  Lab work largely unchanged from baseline.  Troponin elevated but largely unchanged from baseline and then end-stage renal patient.  Patient has interstitial edema on her x-ray does urinate twice daily, but no significant output after IV Lasix.  Patient continues to be on a nonrebreather continues to have difficulty breathing.  We will place the patient on BiPAP.  I spoke to Dr. Holley Raring of  nephrology who is arranging for urgent dialysis.  We will move the patient to room 17 to perform dialysis in the emergency department.  Patient agreeable to plan of care.  Patient will be admitted to the hospital service.  [name] was evaluated in Emergency Department on 01/23/2020 for the symptoms described in the history of present illness. She was evaluated in the context of the global COVID-19 pandemic, which necessitated consideration that the patient might be at risk for infection with the SARS-CoV-2 virus that causes COVID-19. Institutional protocols and algorithms that pertain to the evaluation of patients at risk for COVID-19 are in a state of rapid change based on information released by regulatory bodies including the CDC and federal and state organizations. These policies and algorithms were followed during the patient's care in the ED.  CRITICAL CARE Performed by: Harvest Dark   Total critical care time: 30 minutes  Critical care time was exclusive of separately billable procedures and treating other patients.  Critical care was necessary to treat or prevent imminent or life-threatening deterioration.  Critical care was time spent personally by me on the following activities: development of treatment plan with patient and/or surrogate as well as nursing, discussions with consultants, evaluation of patient's response to treatment, examination of patient, obtaining history from patient or surrogate, ordering and performing treatments and interventions, ordering and review of laboratory studies, ordering and review of radiographic studies, pulse oximetry and re-evaluation of patient's condition.  ____________________________________________   FINAL CLINICAL IMPRESSION(S) / ED DIAGNOSES  Shortness of breath Fluid overload Pulmonary edema   Harvest Dark, MD 01/23/20 1339

## 2020-01-23 NOTE — ED Notes (Signed)
Nephrology called for pt to possibly go on dialysis at this time.

## 2020-01-23 NOTE — ED Notes (Signed)
Pt moved to RM 17 for dialysis, Report given to [facility].

## 2020-01-23 NOTE — ED Notes (Signed)
Pt does not seem to be getting better and is having the same increased work of breathing, pt is still on NRB at 15L/min with no improvements. MD aware, RT called for bipap.

## 2020-01-23 NOTE — Consult Note (Addendum)
ANTICOAGULATION CONSULT NOTE  Pharmacy Consult for Warfarin Indication: Mechanical valve  Allergies  Allergen Reactions  . Buspirone Other (See Comments)    Weakness  . Diltiazem Hcl Palpitations  . Gabapentin Palpitations  . Hydralazine Rash  . Lisinopril Cough  . Lovastatin Palpitations  . Metformin Diarrhea  . Pravastatin Other (See Comments)    Insomnia  . Sitagliptin Other (See Comments)    Constipation  . Tramadol Itching    Patient Measurements: Height: 5\' 3"  (160 cm) Weight: 104.3 kg (230 lb) IBW/kg (Calculated) : 52.4  Vital Signs: Temp: 97.4 F (36.3 C) (12/05 1125) Temp Source: Axillary (12/05 1125) BP: 120/81 (12/05 1400) Pulse Rate: 113 (12/05 1200)  Labs: Recent Labs    01/23/20 1143  HGB 11.3*  HCT 37.6  PLT 193  CREATININE 6.26*  TROPONINIHS 78*    Estimated Creatinine Clearance: 9.8 mL/min (A) (by C-G formula based on SCr of 6.26 mg/dL (H)).   Medical History: Past Medical History:  Diagnosis Date  . Anemia   . Anxiety   . Aortic valve stenosis   . Arrhythmia    atrial fibrillation  . Arthritis    feet, legs  . B12 deficiency   . Bowel obstruction ([place])   . CHF (congestive heart failure) ([place])   . CKD (chronic kidney disease)    protein in urine  . Colostomy in place [facility])   . Diabetes mellitus without complication ([place])   . Dialysis patient [facility])    T/Th/Sa  . Diverticulitis large intestine   . Dysrhythmia   . Ectopic atrial tachycardia ([place])   . FSGS (focal segmental glomerulosclerosis)   . Gastritis   . GI bleed   . Heart murmur   . Hyperlipidemia   . Hypertension   . Hypothyroidism   . Hypothyroidism    unspecified  . MRSA (methicillin resistant Staphylococcus aureus)    at abdominal wound.  Jan 2017.  Treated.   . Neuropathy   . Neuropathy in diabetes ([place])   . Obesity   . Pedal edema   . Psoriasis   . Shortness of breath dyspnea   . Sleep apnea    CPAP  . Vertigo     Medications:  Warfarin 1.5mg  daily;  last dose was 12/4 - pharmacy tech confirmed dose with patient's daughter  Assessment: 69yo female with a past medical history of anemia, anxiety, diabetes, CHF, CKD ESRD (hemodialysis MWF), hypertension, obesity, presents to the emergency department for shortness of breath. Patient was on Eliquis in the past and was switched to warfarin around October 2021. Pharmacy has been consulted for warfarin dosing and monitoring for afib and s/p TAVR (2019). Patient [facility] at the [facility]. Hgb 11.3 slightly decreased but appears to be patient's baseline. Spoke with patient's daughter - she stated patient was previously on warfarin 2mg  and was changed to 1mg  around 12/19/19 due to having darker stools, then patient was recently changed to warfarin 1.5mg  12/2.   DDI: amiodarone and levothyroxine (also PTA meds)   Date INR Dose 12/5 1.1     Goal of Therapy:  INR 2-3 Monitor platelets by anticoagulation protocol: Yes   Plan:  INR 1.1 - subtherapeutic; Will give warfarin 3mg  tonight Monitor INR daily and adjust dose as clinically indicated Monitor CBC per protocol   Sherilyn Banker, PharmD Pharmacy Resident  01/23/2020 2:34 PM

## 2020-01-23 NOTE — ED Notes (Signed)
Medication Reconciliation Report  For Home History Technicians  HIGHLIGHTS:  1. The patient WAS NOT personally interviewed 2. If not, what was the main source used: FAMILY/CAREGIVER TESTIMONY/DOCS 3. Does the patient appear to take any anti-coagulation agents (e.g. warfarin, Eliquis or Xarelto): YES 4. Does the patient appear to take any anti-convulsant agents (e.g. divalproex, levetiracetam or phenytoin): NO 5. Does the patient appear to use any insulin products (e.g. Lantus, Novolin or Humalog): YES 6. Does the patient appear to take any "beta-blockers" (e.g. metoprolol, carvedilol or bisoprolol: NO  BARRIERS:  1. Were there any barriers that prevented or complicated the medication reconciliation process: NO 2. If yes, what was the primary barrier encountered: None 3. Does the patient appear compliant with prescribed medications: YES 4. Does the patient express any barriers with compliance: NO 5. What is the primary barrier the patient reports: None   NOTES:[Include any concerns, remarks or complaints the patient expresses regarding medication therapy. Any observations or other information that might be useful to the treatment team can also be included. Immediate needs or concerns should be referred to the RN or appropriate member of the treatment team.]  Patient was unable to participate in home medication interview due to being on BiPap. Daughter at bedside Clinical cytogeneticist) provided detailed information on patient's home medications. According to daughter, patient's insulin has been reduced and changed to as needed. VALSARTAN, METOPROLOL and ROPINIROLE are not currently taken.   Colen Darling, CPhT [place] [facility] [place]. [place], [place] [address] [phone]  ** The above is intended solely for informational and/or communicative purposes. It should in no way be considered an endorsement of any specific treatment, therapy or action. **

## 2020-01-23 NOTE — ED Notes (Addendum)
Daughter Museum/gallery conservator  [phone]

## 2020-01-23 NOTE — Progress Notes (Signed)
Patient well-known to [place] as an outpatient.  On dialysis on MWF schedule.  Comes in now with significant shortness of breath.  Chest x-ray shows cardiomegaly with vascular congestion consistent with pulmonary edema.  Dialysis nurse on her way to perform dialysis treatment.

## 2020-01-23 NOTE — ED Notes (Signed)
Dialysis at bedside

## 2020-01-23 NOTE — ED Triage Notes (Signed)
Pt arrived via EMS from home with increasing SOB, CHF HX

## 2020-01-23 NOTE — ED Notes (Signed)
Notified Dr Kerman Passey of pt condition

## 2020-01-24 ENCOUNTER — Other Ambulatory Visit: Payer: Self-pay

## 2020-01-24 DIAGNOSIS — Z6841 Body Mass Index (BMI) 40.0 and over, adult: Secondary | ICD-10-CM | POA: Diagnosis not present

## 2020-01-24 DIAGNOSIS — Z87891 Personal history of nicotine dependence: Secondary | ICD-10-CM | POA: Diagnosis not present

## 2020-01-24 DIAGNOSIS — I5023 Acute on chronic systolic (congestive) heart failure: Secondary | ICD-10-CM | POA: Diagnosis present

## 2020-01-24 DIAGNOSIS — Z20822 Contact with and (suspected) exposure to covid-19: Secondary | ICD-10-CM | POA: Diagnosis present

## 2020-01-24 DIAGNOSIS — Z7901 Long term (current) use of anticoagulants: Secondary | ICD-10-CM | POA: Diagnosis not present

## 2020-01-24 DIAGNOSIS — Z794 Long term (current) use of insulin: Secondary | ICD-10-CM | POA: Diagnosis not present

## 2020-01-24 DIAGNOSIS — Z79899 Other long term (current) drug therapy: Secondary | ICD-10-CM | POA: Diagnosis not present

## 2020-01-24 DIAGNOSIS — I132 Hypertensive heart and chronic kidney disease with heart failure and with stage 5 chronic kidney disease, or end stage renal disease: Secondary | ICD-10-CM | POA: Diagnosis present

## 2020-01-24 DIAGNOSIS — J9621 Acute and chronic respiratory failure with hypoxia: Secondary | ICD-10-CM | POA: Diagnosis present

## 2020-01-24 DIAGNOSIS — J9811 Atelectasis: Secondary | ICD-10-CM | POA: Diagnosis present

## 2020-01-24 DIAGNOSIS — Z79890 Hormone replacement therapy (postmenopausal): Secondary | ICD-10-CM | POA: Diagnosis not present

## 2020-01-24 DIAGNOSIS — E871 Hypo-osmolality and hyponatremia: Secondary | ICD-10-CM | POA: Diagnosis present

## 2020-01-24 DIAGNOSIS — N2581 Secondary hyperparathyroidism of renal origin: Secondary | ICD-10-CM | POA: Diagnosis present

## 2020-01-24 DIAGNOSIS — Z9841 Cataract extraction status, right eye: Secondary | ICD-10-CM | POA: Diagnosis not present

## 2020-01-24 DIAGNOSIS — Z9049 Acquired absence of other specified parts of digestive tract: Secondary | ICD-10-CM | POA: Diagnosis not present

## 2020-01-24 DIAGNOSIS — I248 Other forms of acute ischemic heart disease: Secondary | ICD-10-CM | POA: Diagnosis present

## 2020-01-24 DIAGNOSIS — T82858A Stenosis of vascular prosthetic devices, implants and grafts, initial encounter: Secondary | ICD-10-CM | POA: Diagnosis not present

## 2020-01-24 DIAGNOSIS — Z8249 Family history of ischemic heart disease and other diseases of the circulatory system: Secondary | ICD-10-CM | POA: Diagnosis not present

## 2020-01-24 DIAGNOSIS — Y832 Surgical operation with anastomosis, bypass or graft as the cause of abnormal reaction of the patient, or of later complication, without mention of misadventure at the time of the procedure: Secondary | ICD-10-CM | POA: Diagnosis not present

## 2020-01-24 DIAGNOSIS — J9601 Acute respiratory failure with hypoxia: Secondary | ICD-10-CM | POA: Diagnosis not present

## 2020-01-24 DIAGNOSIS — Z800 Family history of malignant neoplasm of digestive organs: Secondary | ICD-10-CM | POA: Diagnosis not present

## 2020-01-24 DIAGNOSIS — I4819 Other persistent atrial fibrillation: Secondary | ICD-10-CM | POA: Diagnosis present

## 2020-01-24 DIAGNOSIS — Z833 Family history of diabetes mellitus: Secondary | ICD-10-CM | POA: Diagnosis not present

## 2020-01-24 DIAGNOSIS — R0902 Hypoxemia: Secondary | ICD-10-CM | POA: Diagnosis present

## 2020-01-24 DIAGNOSIS — Z992 Dependence on renal dialysis: Secondary | ICD-10-CM | POA: Diagnosis not present

## 2020-01-24 DIAGNOSIS — Z888 Allergy status to other drugs, medicaments and biological substances status: Secondary | ICD-10-CM | POA: Diagnosis not present

## 2020-01-24 DIAGNOSIS — T82898A Other specified complication of vascular prosthetic devices, implants and grafts, initial encounter: Secondary | ICD-10-CM | POA: Diagnosis not present

## 2020-01-24 DIAGNOSIS — Z9842 Cataract extraction status, left eye: Secondary | ICD-10-CM | POA: Diagnosis not present

## 2020-01-24 DIAGNOSIS — N186 End stage renal disease: Secondary | ICD-10-CM | POA: Diagnosis present

## 2020-01-24 LAB — PROTIME-INR
INR: 1.1 (ref 0.8–1.2)
Prothrombin Time: 13.9 seconds (ref 11.4–15.2)

## 2020-01-24 LAB — CBC
HCT: 37.2 % (ref 36.0–46.0)
Hemoglobin: 10.7 g/dL — ABNORMAL LOW (ref 12.0–15.0)
MCH: 29 pg (ref 26.0–34.0)
MCHC: 28.8 g/dL — ABNORMAL LOW (ref 30.0–36.0)
MCV: 100.8 fL — ABNORMAL HIGH (ref 80.0–100.0)
Platelets: 197 10*3/uL (ref 150–400)
RBC: 3.69 MIL/uL — ABNORMAL LOW (ref 3.87–5.11)
RDW: 16.3 % — ABNORMAL HIGH (ref 11.5–15.5)
WBC: 10.2 10*3/uL (ref 4.0–10.5)
nRBC: 0 % (ref 0.0–0.2)

## 2020-01-24 LAB — BASIC METABOLIC PANEL
Anion gap: 11 (ref 5–15)
BUN: 28 mg/dL — ABNORMAL HIGH (ref 8–23)
CO2: 28 mmol/L (ref 22–32)
Calcium: 9.2 mg/dL (ref 8.9–10.3)
Chloride: 97 mmol/L — ABNORMAL LOW (ref 98–111)
Creatinine, Ser: 4.9 mg/dL — ABNORMAL HIGH (ref 0.44–1.00)
GFR, Estimated: 9 mL/min — ABNORMAL LOW (ref >60)
Glucose, Bld: 170 mg/dL — ABNORMAL HIGH (ref 70–99)
Potassium: 5.6 mmol/L — ABNORMAL HIGH (ref 3.5–5.1)
Sodium: 136 mmol/L (ref 135–145)

## 2020-01-24 LAB — GLUCOSE, CAPILLARY
Glucose-Capillary: 121 mg/dL — ABNORMAL HIGH (ref 70–99)
Glucose-Capillary: 149 mg/dL — ABNORMAL HIGH (ref 70–99)

## 2020-01-24 LAB — HIV ANTIBODY (ROUTINE TESTING W REFLEX): HIV Screen 4th Generation wRfx: NONREACTIVE

## 2020-01-24 LAB — HEPATITIS B SURFACE ANTIGEN: Hepatitis B Surface Ag: NONREACTIVE

## 2020-01-24 LAB — PHOSPHORUS: Phosphorus: 5.4 mg/dL — ABNORMAL HIGH (ref 2.5–4.6)

## 2020-01-24 MED ORDER — HEPARIN (PORCINE) 25000 UT/250ML-% IV SOLN
1700.0000 [IU]/h | INTRAVENOUS | Status: DC
  Administered 2020-01-24: 1000 [IU]/h via INTRAVENOUS
  Administered 2020-01-25: 1350 [IU]/h via INTRAVENOUS
  Administered 2020-01-26: 1550 [IU]/h via INTRAVENOUS
  Administered 2020-01-27 – 2020-01-28 (×3): 1700 [IU]/h via INTRAVENOUS
  Filled 2020-01-24 (×7): qty 250

## 2020-01-24 MED ORDER — WARFARIN SODIUM 3 MG PO TABS
3.0000 mg | ORAL_TABLET | Freq: Once | ORAL | Status: AC
  Administered 2020-01-24: 3 mg via ORAL
  Filled 2020-01-24: qty 1

## 2020-01-24 NOTE — Consult Note (Signed)
CARDIOLOGY CONSULT NOTE               Patient ID: [name] MRN: [id number] DOB/AGE: Sep 27, 1950 69 y.o.  Admit date: 01/23/2020 Referring Physician Dr. Rupert Stacks hospitalist Primary Physician Dr. Kerrin Mo primary Primary Cardiologist [place] cardi[facility] Reason for Consultation heart failure shortness of breath  HPI: 69 year old white thin black female history of insulin-dependent diabetes congestive heart failure reduced left ventricular function initial heart function around 45% end-stage renal disease on dialysis obstructive sleep apnea on CPAP hypertension aortic sclerosis chronic atrial fibrillation status post total valve replacement TAVR shortness of breath.  Patient denies any chest pain no blackout spells or syncope has had multiple ER visits for shortness of breath and chest pain and symptom shortness of breath and and heart failure.  Review of systems complete and found to be negative unless listed above     Past Medical History:  Diagnosis Date  . Anemia   . Anxiety   . Aortic valve stenosis   . Arrhythmia    atrial fibrillation  . Arthritis    feet, legs  . B12 deficiency   . Bowel obstruction ([place])   . CHF (congestive heart failure) ([place])   . CKD (chronic kidney disease)    protein in urine  . Colostomy in place [facility])   . Diabetes mellitus without complication ([place])   . Dialysis patient [facility])    T/Th/Sa  . Diverticulitis large intestine   . Dysrhythmia   . Ectopic atrial tachycardia ([place])   . FSGS (focal segmental glomerulosclerosis)   . Gastritis   . GI bleed   . Heart murmur   . Hyperlipidemia   . Hypertension   . Hypothyroidism   . Hypothyroidism    unspecified  . MRSA (methicillin resistant Staphylococcus aureus)    at abdominal wound.  Jan 2017.  Treated.   . Neuropathy   . Neuropathy in diabetes ([place])   . Obesity   . Pedal edema   . Psoriasis   . Shortness of breath dyspnea   . Sleep apnea    CPAP  . Vertigo     Past Surgical  History:  Procedure Laterality Date  . ABDOMINAL HYSTERECTOMY    . APPLICATION OF WOUND VAC N/A 02/24/2015   Procedure: APPLICATION OF WOUND VAC;  Surgeon: Clayburn Pert, MD;  Location: [facility];  Service: General;  Laterality: N/A;  . APPLICATION OF WOUND VAC N/A 02/26/2015   Procedure: APPLICATION OF WOUND VAC;  Surgeon: Clayburn Pert, MD;  Location: [facility];  Service: General;  Laterality: N/A;  . AV FISTULA PLACEMENT Left 01/29/2019   Procedure: ARTERIOVENOUS (AV) FISTULA CREATION ( BRACHIAL CEPHALIC);  Surgeon: Katha Cabal, MD;  Location: [facility];  Service: Vascular;  Laterality: Left;  . BACK SURGERY     spur frmoved from lower back  . CARDIAC CATHETERIZATION    . CARDIAC VALVE REPLACEMENT  08/2017   [facility]  . CARDIOVERSION N/A 07/15/2018   Procedure: CARDIOVERSION;  Surgeon: Corey Skains, MD;  Location: [facility];  Service: Cardiovascular;  Laterality: N/A;  . CATARACT EXTRACTION W/PHACO Right 11/16/2018   Procedure: CATARACT EXTRACTION PHACO AND INTRAOCULAR LENS PLACEMENT (IOC) RIGHT DIABETiC  01:18.3  13.5%  10.82;  Surgeon: Eulogio Bear, MD;  Location: [place];  Service: Ophthalmology;  Laterality: Right;  Diabetic - insulin sleep apnea  . CATARACT EXTRACTION W/PHACO Left 12/07/2018   Procedure: CATARACT EXTRACTION PHACO AND INTRAOCULAR LENS PLACEMENT (IOC) LEFT DIABETIC 01:02.6  8.8%  5.77;  Surgeon: Eulogio Bear, MD;  Location: [place];  Service: Ophthalmology;  Laterality: Left;  Diabetic - insulin  . CHOLECYSTECTOMY    . COLECTOMY    . COLONOSCOPY WITH PROPOFOL N/A 09/04/2016   Procedure: COLONOSCOPY WITH PROPOFOL;  Surgeon: Manya Silvas, MD;  Location: [facility];  Service: Endoscopy;  Laterality: N/A;  . COLOSTOMY REVERSAL N/A 07/12/2015   Procedure: COLOSTOMY REVERSAL;  Surgeon: Clayburn Pert, MD;  Location: [facility];  Service: General;  Laterality: N/A;  . DEBRIDEMENT OF ABDOMINAL WALL ABSCESS N/A 02/22/2015    Procedure: DEBRIDEMENT OF ABDOMINAL WALL ABSCESS;  Surgeon: Clayburn Pert, MD;  Location: [facility];  Service: General;  Laterality: N/A;  . DIALYSIS/PERMA CATHETER INSERTION N/A 10/20/2018   Procedure: DIALYSIS/PERMA CATHETER INSERTION;  Surgeon: Katha Cabal, MD;  Location: [place] [facility];  Service: Cardiovascular;  Laterality: N/A;  . DIALYSIS/PERMA CATHETER REMOVAL N/A 08/03/2019   Procedure: DIALYSIS/PERMA CATHETER REMOVAL;  Surgeon: Katha Cabal, MD;  Location: [place] [facility];  Service: Cardiovascular;  Laterality: N/A;  . EXCISION MASS ABDOMINAL N/A 02/24/2015   Procedure: EXCISION MASS ABDOMINAL  / [place];  Surgeon: Clayburn Pert, MD;  Location: [facility];  Service: General;  Laterality: N/A;  . EXCISION MASS ABDOMINAL N/A 02/26/2015   Procedure: EXCISION MASS ABDOMINAL/wash out;  Surgeon: Clayburn Pert, MD;  Location: [facility];  Service: General;  Laterality: N/A;  . FLEXIBLE SIGMOIDOSCOPY  06/19/2015   Procedure: FLEXIBLE SIGMOIDOSCOPY;  Surgeon: Lucilla Lame, MD;  Location: [place];  Service: Endoscopy;;  UNABLE TO ACCESS OSTOMY SITE FOR ACCESS INTO COLON  . HERNIA REPAIR     umbilical  . LAPAROSCOPY  07/12/2015   Procedure: LAPAROSCOPY DIAGNOSTIC;  Surgeon: Clayburn Pert, MD;  Location: [facility];  Service: General;;  . LAPAROTOMY  07/12/2015   Procedure: EXPLORATORY LAPAROTOMY;  Surgeon: Clayburn Pert, MD;  Location: [facility];  Service: General;;  . LAPAROTOMY N/A 02/06/2015   Procedure: Laparotomy, reduction of incarcerated parastomal hernia, repair of parastomal hernia with mesh;  Surgeon: Sherri Rad, MD;  Location: [facility];  Service: General;  Laterality: N/A;  . laparotomy closure of cecal perforation  05/09/2013   Dr. Marina Gravel  . LYSIS OF ADHESION  07/12/2015   Procedure: LYSIS OF ADHESION;  Surgeon: Clayburn Pert, MD;  Location: [facility];  Service: General;;  . TONSILLECTOMY    . WOUND DEBRIDEMENT N/A 03/03/2015   Procedure: DEBRIDEMENT  ABDOMINAL WOUND;  Surgeon: Florene Glen, MD;  Location: [facility];  Service: General;  Laterality: N/A;  . WOUND DEBRIDEMENT N/A 03/09/2015   Procedure: DEBRIDEMENT ABDOMINAL WOUND;  Surgeon: Florene Glen, MD;  Location: [facility];  Service: General;  Laterality: N/A;    (Not in a hospital admission)  Social History   Socioeconomic History  . Marital status: Divorced    Spouse name: Not on file  . Number of children: 1  . Years of education: Not on file  . Highest education level: Not on file  Occupational History  . Occupation: Retired     Comment: Licensed conveyancer   Tobacco Use  . Smoking status: Former Smoker    Types: Cigarettes    Quit date: 04/18/2013    Years since quitting: 6.7  . Smokeless tobacco: Never Used  Vaping Use  . Vaping Use: Never used  Substance and Sexual Activity  . Alcohol use: No    Alcohol/week: 0.0 standard drinks  . Drug use: No  . Sexual activity: Not on  file  Other Topics Concern  . Not on file  Social History Narrative  . Not on file   Social Determinants of Health   Financial Resource Strain:   . Difficulty of Paying Living Expenses: Not on file  Food Insecurity:   . Worried About Charity fundraiser in the Last Year: Not on file  . Ran Out of Food in the Last Year: Not on file  Transportation Needs:   . Lack of Transportation (Medical): Not on file  . Lack of Transportation (Non-Medical): Not on file  Physical Activity:   . Days of Exercise per Week: Not on file  . Minutes of Exercise per Session: Not on file  Stress:   . Feeling of Stress : Not on file  Social Connections:   . Frequency of Communication with Friends and Family: Not on file  . Frequency of Social Gatherings with Friends and Family: Not on file  . Attends Religious Services: Not on file  . Active Member of Clubs or Organizations: Not on file  . Attends Archivist Meetings: Not on file  . Marital Status: Not on file  Intimate Partner Violence:   . Fear of  Current or Ex-Partner: Not on file  . Emotionally Abused: Not on file  . Physically Abused: Not on file  . Sexually Abused: Not on file    Family History  Problem Relation Age of Onset  . Diabetes Mother   . Hypertension Father   . CAD Father   . Heart attack Father   . Colon cancer Brother       Review of systems complete and found to be negative unless listed above      PHYSICAL EXAM  General: Well developed, well nourished, in no acute distress HEENT:  Normocephalic and atramatic Neck:  No JVD.  Lungs: Clear bilaterally to auscultation and percussion. Heart:  irregular irregular. Normal S1 and S2 without gallops or murmurs.  Abdomen: Bowel sounds are positive, abdomen soft and non-tender  Msk:  Back normal, normal gait. Normal strength and tone for age. Extremities: No clubbing, cyanosis or edema.   Neuro: Alert and oriented X 3. Psych:  Good affect, responds appropriately  Labs:   Lab Results  Component Value Date   WBC 10.2 01/24/2020   HGB 10.7 (L) 01/24/2020   HCT 37.2 01/24/2020   MCV 100.8 (H) 01/24/2020   PLT 197 01/24/2020    Recent Labs  Lab 01/23/20 1143 01/23/20 1143 01/24/20 0626  NA 135   < > 136  K 4.5   < > 5.6*  CL 95*   < > 97*  CO2 28   < > 28  BUN 41*   < > 28*  CREATININE 6.26*   < > 4.90*  CALCIUM 8.8*   < > 9.2  PROT 7.5  --   --   BILITOT 0.9  --   --   ALKPHOS 58  --   --   ALT 18  --   --   AST 22  --   --   GLUCOSE 214*   < > 170*   < > = values in this interval not displayed.   Lab Results  Component Value Date   TROPONINI <0.03 07/12/2018   No results found for: CHOL No results found for: HDL No results found for: LDLCALC No results found for: TRIG No results found for: CHOLHDL No results found for: LDLDIRECT    Radiology: DG Chest Portable 1  View  Result Date: 01/23/2020 CLINICAL DATA:  Increasing shortness of breath. History of CHF and hypertension. EXAM: PORTABLE CHEST 1 VIEW COMPARISON:  09/07/2019 FINDINGS:  Cardiac silhouette is mildly enlarged. Prosthetic endovascular aortic valve. No mediastinal or hilar masses. Lungs demonstrate vascular congestion with mild interstitial prominence similar to the prior radiograph. No lung consolidation. No convincing pleural effusion and no pneumothorax. Stable surgical vascular clips project over the left upper hemithorax. Previous cervical spine fusion, stable. Skeletal structures are grossly intact. IMPRESSION: 1. Cardiomegaly vascular congestion and mild interstitial prominence. Suspect mild congestive heart failure. No evidence of pneumonia. Electronically Signed   By: Lajean Manes M.D.   On: 01/23/2020 12:19    EKG: Atrial fibrillation rate of 110 nonspecific ST-T wave changes  ASSESSMENT AND PLAN:  Congestive heart failure End-stage renal disease on dialysis Shortness of breath Status post total valve replacement Obesity Hypoxemia Diabetes type 2 Cardiomyopathy ejection fraction of 25% Obstructive sleep apnea Persistent atrial fibrillation . Plan Agree with admit to telemetry Rule out myocardial infarction follow-up EKGs and troponins Agree with echocardiogram for assessment of left ventricular function wall motion Recommend aggressive heart failure therapy Consider low-dose Entresto 24/26 twice a day Continue Lipitor for lipid management Continue diabetes management and control with Lantus Recommend starting Coreg 6.25 twice a day for heart failure and hypertension management Continue amiodarone for A. fib management with Eliquis for anticoagulation   Signed: Yolonda Kida MD 01/24/2020, 10:07 AM

## 2020-01-24 NOTE — ED Notes (Signed)
Spoke with [name] in lab regarding need for venipuncture assist with am labs.

## 2020-01-24 NOTE — ED Notes (Signed)
Pt awake, requesting to remove blood pressure cuff, cpap. cpap removed per request and nasal cannula replaced at 4lpm . Water provided per pt request.

## 2020-01-24 NOTE — ED Notes (Signed)
Pt continues to rest, tolerating cpap well.

## 2020-01-24 NOTE — Progress Notes (Signed)
[name]  ROUNDING NOTE   Subjective:   Patient known to our practice from out patient dialysis, presented to the ED with c/o shortness of breath. Patient denies fever, nausea, vomiting, diarrhea or chest pain.  Patient received urgent dialysis last night   Objective:  Vital signs in last 24 hours:  Temp:  [97.8 F (36.6 C)-98 F (36.7 C)] 97.8 F (36.6 C) (12/06 1516) Pulse Rate:  [70-110] 89 (12/06 1516) Resp:  [11-33] 20 (12/06 1330) BP: (88-130)/(53-94) 130/67 (12/06 1516) SpO2:  [81 %-100 %] 91 % (12/06 1330) FiO2 (%):  [60 %] 60 % (12/06 0430) Weight:  [107.5 kg] 107.5 kg (12/06 1443)  Weight change:  Filed Weights   01/23/20 1127 01/24/20 1443  Weight: 104.3 kg 107.5 kg    Intake/Output: No intake/output data recorded.   Intake/Output this shift:  No intake/output data recorded.  Physical Exam: General: Resting in bed, in moderate distress  Head: Normocephalic, atraumatic. Moist oral mucosal membranes  Eyes: Anicteric  Lungs:  Increased work of breathing, using accessory muscles for respirations, lungs diminished at the bases, on 6L O2  Heart: Regular rate and rhythm  Abdomen:  Soft, nontender,   Extremities:  Trace peripheral edema.  Neurologic: Oriented, moving all four extremities  Skin: No acute  Lesions or rashes   Access: LUA AVF +bruit , +thrill    Basic Metabolic Panel: Recent Labs  Lab 01/23/20 1143 01/24/20 0626  NA 135 136  K 4.5 5.6*  CL 95* 97*  CO2 28 28  GLUCOSE 214* 170*  BUN 41* 28*  CREATININE 6.26* 4.90*  CALCIUM 8.8* 9.2  PHOS  --  5.4*    Liver Function Tests: Recent Labs  Lab 01/23/20 1143  AST 22  ALT 18  ALKPHOS 58  BILITOT 0.9  PROT 7.5  ALBUMIN 3.8   No results for input(s): LIPASE, AMYLASE in the last 168 hours. No results for input(s): AMMONIA in the last 168 hours.  CBC: Recent Labs  Lab 01/23/20 1143 01/24/20 0626  WBC 9.6 10.2  HGB 11.3* 10.7*  HCT 37.6 37.2  MCV 98.7 100.8*   PLT 193 197    Cardiac Enzymes: No results for input(s): CKTOTAL, CKMB, CKMBINDEX, TROPONINI in the last 168 hours.  BNP: Invalid input(s): POCBNP  CBG: Recent Labs  Lab 01/23/20 2227  GLUCAP 170*    Microbiology: Results for orders placed or performed during the hospital encounter of 01/23/20  Resp Panel by RT-PCR (Flu A&B, Covid) Nasopharyngeal Swab     Status: None   Collection Time: 01/23/20 11:43 AM   Specimen: Nasopharyngeal Swab; Nasopharyngeal(NP) swabs in vial transport medium  Result Value Ref Range Status   SARS Coronavirus 2 by RT PCR NEGATIVE NEGATIVE Final    Comment: (NOTE) SARS-CoV-2 target nucleic acids are NOT DETECTED.  The SARS-CoV-2 RNA is generally detectable in upper respiratory specimens during the acute phase of infection. The lowest concentration of SARS-CoV-2 viral copies this assay can detect is 138 copies/mL. A negative result does not preclude SARS-Cov-2 infection and should not be used as the sole basis for treatment or other patient management decisions. A negative result may occur with  improper specimen collection/handling, submission of specimen other than nasopharyngeal swab, presence of viral mutation(s) within the areas targeted by this assay, and inadequate number of viral copies(<138 copies/mL). A negative result must be combined with clinical observations, patient history, and epidemiological information. The expected result is Negative.  Fact Sheet for Patients:  EntrepreneurPulse.com.au  Fact  Sheet for Healthcare Providers:  IncredibleEmployment.be  This test is no t yet approved or cleared by the [place] FDA and  has been authorized for detection and/or diagnosis of SARS-CoV-2 by FDA under an Emergency Use Authorization (EUA). This EUA will remain  in effect (meaning this test can be used) for the duration of the COVID-19 declaration under Section 564(b)(1) of the Act,  21 U.S.C.section 360bbb-3(b)(1), unless the authorization is terminated  or revoked sooner.       Influenza A by PCR NEGATIVE NEGATIVE Final   Influenza B by PCR NEGATIVE NEGATIVE Final    Comment: (NOTE) The Xpert Xpress SARS-CoV-2/FLU/RSV plus assay is intended as an aid in the diagnosis of influenza from Nasopharyngeal swab specimens and should not be used as a sole basis for treatment. Nasal washings and aspirates are unacceptable for Xpert Xpress SARS-CoV-2/FLU/RSV testing.  Fact Sheet for Patients: EntrepreneurPulse.com.au  Fact Sheet for Healthcare Providers: IncredibleEmployment.be  This test is not yet approved or cleared by the [place] FDA and has been authorized for detection and/or diagnosis of SARS-CoV-2 by FDA under an Emergency Use Authorization (EUA). This EUA will remain in effect (meaning this test can be used) for the duration of the COVID-19 declaration under Section 564(b)(1) of the Act, 21 U.S.C. section 360bbb-3(b)(1), unless the authorization is terminated or revoked.  Performed at [facility], [address] [place], [place] [address]     Coagulation Studies: Recent Labs    01/23/20 1142 01/24/20 0626  LABPROT 13.9 13.9  INR 1.1 1.1    Urinalysis: No results for input(s): COLORURINE, LABSPEC, PHURINE, GLUCOSEU, HGBUR, BILIRUBINUR, KETONESUR, PROTEINUR, UROBILINOGEN, NITRITE, LEUKOCYTESUR in the last 72 hours.  Invalid input(s): APPERANCEUR    Imaging: DG Chest Portable 1 View  Result Date: 01/23/2020 CLINICAL DATA:  Increasing shortness of breath. History of CHF and hypertension. EXAM: PORTABLE CHEST 1 VIEW COMPARISON:  09/07/2019 FINDINGS: Cardiac silhouette is mildly enlarged. Prosthetic endovascular aortic valve. No mediastinal or hilar masses. Lungs demonstrate vascular congestion with mild interstitial prominence similar to the prior radiograph. No lung consolidation. No convincing  pleural effusion and no pneumothorax. Stable surgical vascular clips project over the left upper hemithorax. Previous cervical spine fusion, stable. Skeletal structures are grossly intact. IMPRESSION: 1. Cardiomegaly vascular congestion and mild interstitial prominence. Suspect mild congestive heart failure. No evidence of pneumonia. Electronically Signed   By: Lajean Manes M.D.   On: 01/23/2020 12:19     Medications:   . sodium chloride    . sodium chloride    . heparin 1,000 Units/hr (01/24/20 1531)   . amiodarone  200 mg Oral Daily  . atorvastatin  40 mg Oral Daily  . Chlorhexidine Gluconate Cloth  6 each Topical Q0600  . DULoxetine  30 mg Oral Daily  . insulin glargine  45 Units Subcutaneous QHS  . levothyroxine  88 mcg Oral QAC breakfast  . midodrine  10 mg Oral Q M,W,F-HD  . [START ON 01/25/2020] Vitamin D (Ergocalciferol)  50,000 Units Oral Q Tue  . warfarin  3 mg Oral ONCE-1600  . Warfarin - Pharmacist Dosing Inpatient   Does not apply q1600   sodium chloride, sodium chloride, alteplase, heparin, lidocaine (PF), lidocaine-prilocaine, ondansetron **OR** ondansetron (ZOFRAN) IV, oxyCODONE-acetaminophen **AND** oxyCODONE, pentafluoroprop-tetrafluoroeth, polyethylene glycol  Assessment/ Plan:  [name] is a 69 y.o.  female with medical problems including Diabetes Type 2, HF with EF of 45%, ESRD on HD MWF, presented to the ED on 01/22/2020 for SOB.  #  ESRD on HD MWF #Volume overload [facility] [place],MWF, LUA AVF  Received urgent dialysis last night Dr.Lateef discussed with the patient and family regarding additional dialysis on Saturdays moving forward Patient and daughter in agreement with the plan Plan to dialyze patient again today  # Hyperkalemia Potassium 5.2 today Will use low K+ bath for dialysis  #Anemia of CKD Lab Results  Component Value Date   HGB 10.7 (L) 01/24/2020  No acute indication for Epogen  #Secondary Hyperparathyroidism Lab Results   Component Value Date   PTH 202 (H) 06/11/2019   CALCIUM 9.2 01/24/2020   CAION 1.14 (L) 01/29/2019   PHOS 5.4 (H) 01/24/2020  Will continue monitoring bone mineral metabolism parameters  #Diabetes Type 2 with CKD Lab Results  Component Value Date   HGBA1C 6.8 (H) 09/07/2019  Blood glucose readings slightly above goal Continue Insulin Glargine   LOS: 0 Koltin Wehmeyer 12/6/20215:42 PM

## 2020-01-24 NOTE — Progress Notes (Signed)
Cannulation to venous site x 2, unsuccessful attempts with noting small clots being removed from the venous site. Patient states she had an infiltration last Monday at her clinic. Noted bruising to Left upper arm in the venous area. Dr. Holley Raring made aware of this information and will discuss with Vein and Vascular with scheduling for evaluation. Report called to Ria Comment, RN, primary nurse and informed of patients return back to room and the reasoning.

## 2020-01-24 NOTE — ED Notes (Signed)
Report from hunter, rn.  

## 2020-01-24 NOTE — Progress Notes (Signed)
Pt taken off of bipap, tolerating well at this time. Pt on 6lpm [place], sats 95-98. RN notified.

## 2020-01-24 NOTE — ED Notes (Signed)
Pt resting, rt called to place pt on cpap due to sleep apnea and pox dropping into 70s on oxygen while sleeping.

## 2020-01-24 NOTE — Progress Notes (Signed)
Patient states she uses oxygen at home and has had a cpap before. Did not tolerate so no longer has. Patient has refused and states she will continue oxygen at night.

## 2020-01-24 NOTE — ED Notes (Signed)
Report to dee, rn.  

## 2020-01-24 NOTE — ED Notes (Signed)
Pt tolerating c pap well.

## 2020-01-24 NOTE — Consult Note (Addendum)
[place] for Warfarin/Heparin Indication: Mechanical valve  Allergies  Allergen Reactions  . Buspirone Other (See Comments)    Weakness  . Diltiazem Hcl Palpitations  . Gabapentin Palpitations  . Hydralazine Rash  . Lisinopril Cough  . Lovastatin Palpitations  . Metformin Diarrhea  . Pravastatin Other (See Comments)    Insomnia  . Sitagliptin Other (See Comments)    Constipation  . Tramadol Itching    Patient Measurements: Height: 5\' 3"  (160 cm) Weight: 104.3 kg (230 lb) IBW/kg (Calculated) : 52.4  Vital Signs: Temp: 98 F (36.7 C) (12/06 1259) Temp Source: Oral (12/06 1259) BP: 114/94 (12/06 1330) Pulse Rate: 90 (12/06 1330)  Labs: Recent Labs    01/23/20 1142 01/23/20 1143 01/23/20 1526 01/24/20 0626  HGB  --  11.3*  --  10.7*  HCT  --  37.6  --  37.2  PLT  --  193  --  197  LABPROT 13.9  --   --  13.9  INR 1.1  --   --  1.1  CREATININE  --  6.26*  --  4.90*  TROPONINIHS  --  78* 82*  --     Estimated Creatinine Clearance: 12.5 mL/min (A) (by C-G formula based on SCr of 4.9 mg/dL (H)).   Medical History: Past Medical History:  Diagnosis Date  . Anemia   . Anxiety   . Aortic valve stenosis   . Arrhythmia    atrial fibrillation  . Arthritis    feet, legs  . B12 deficiency   . Bowel obstruction ([place])   . CHF (congestive heart failure) ([place])   . CKD (chronic kidney disease)    protein in urine  . Colostomy in place [facility])   . Diabetes mellitus without complication ([place])   . Dialysis patient [facility])    T/Th/Sa  . Diverticulitis large intestine   . Dysrhythmia   . Ectopic atrial tachycardia ([place])   . FSGS (focal segmental glomerulosclerosis)   . Gastritis   . GI bleed   . Heart murmur   . Hyperlipidemia   . Hypertension   . Hypothyroidism   . Hypothyroidism    unspecified  . MRSA (methicillin resistant Staphylococcus aureus)    at abdominal wound.  Jan 2017.  Treated.   . Neuropathy   . Neuropathy in  diabetes ([place])   . Obesity   . Pedal edema   . Psoriasis   . Shortness of breath dyspnea   . Sleep apnea    CPAP  . Vertigo     Medications:  Warfarin 1.5mg  daily; last dose was 12/4 - pharmacy tech confirmed dose with patient's daughter  Assessment: 69yo female with a past medical history of anemia, anxiety, diabetes, CHF, CKD ESRD (hemodialysis MWF), hypertension, obesity, presents to the emergency department for shortness of breath. Patient was on Eliquis in the past and was switched to warfarin around October 2021. Pharmacy has been consulted for warfarin dosing and monitoring for afib and s/p TAVR (2019). Patient follows at the [facility]. Hgb 11.3 slightly decreased but appears to be patient's baseline. Spoke with patient's daughter - she stated patient was previously on warfarin 2mg  and was changed to 1mg  around 12/19/19 due to having darker stools, then patient was recently changed to warfarin 1.5mg  12/2. CHADSVASc of 5. Plan to bridge warfarin with heparin gtt.   DDI: amiodarone and levothyroxine (also PTA meds)   Date INR Dose 12/5 1.1 Never given 12/5  1.1 3 mg  Goal of Therapy:  INR 2-3 for warfarin  Heparin level 0.3 - 0.7.  Monitor platelets by anticoagulation protocol: Yes   Plan:  Heparin:  Will start heparin 1000 units/hr. Will order heparin level in 8 hours. CBC daily while on heparin.   Warfarin: INR subtherapeutic. Will give warfarin 3mg  tonight x 1. Daily INR ordered.    Oswald Hillock, PharmD, BCPS 01/24/2020 2:39 PM

## 2020-01-24 NOTE — ED Notes (Signed)
Pt back to sleep. Will notify day RN to administer when pt awake.

## 2020-01-24 NOTE — H&P (View-Only) (Signed)
[name]  ROUNDING NOTE   Subjective:   Patient known to our practice from out patient dialysis, presented to the ED with c/o shortness of breath. Patient denies fever, nausea, vomiting, diarrhea or chest pain.  Patient received urgent dialysis last night   Objective:  Vital signs in last 24 hours:  Temp:  [97.8 F (36.6 C)-98 F (36.7 C)] 97.8 F (36.6 C) (12/06 1516) Pulse Rate:  [70-110] 89 (12/06 1516) Resp:  [11-33] 20 (12/06 1330) BP: (88-130)/(53-94) 130/67 (12/06 1516) SpO2:  [81 %-100 %] 91 % (12/06 1330) FiO2 (%):  [60 %] 60 % (12/06 0430) Weight:  [107.5 kg] 107.5 kg (12/06 1443)  Weight change:  Filed Weights   01/23/20 1127 01/24/20 1443  Weight: 104.3 kg 107.5 kg    Intake/Output: No intake/output data recorded.   Intake/Output this shift:  No intake/output data recorded.  Physical Exam: General: Resting in bed, in moderate distress  Head: Normocephalic, atraumatic. Moist oral mucosal membranes  Eyes: Anicteric  Lungs:  Increased work of breathing, using accessory muscles for respirations, lungs diminished at the bases, on 6L O2  Heart: Regular rate and rhythm  Abdomen:  Soft, nontender,   Extremities:  Trace peripheral edema.  Neurologic: Oriented, moving all four extremities  Skin: No acute  Lesions or rashes   Access: LUA AVF +bruit , +thrill    Basic Metabolic Panel: Recent Labs  Lab 01/23/20 1143 01/24/20 0626  NA 135 136  K 4.5 5.6*  CL 95* 97*  CO2 28 28  GLUCOSE 214* 170*  BUN 41* 28*  CREATININE 6.26* 4.90*  CALCIUM 8.8* 9.2  PHOS  --  5.4*    Liver Function Tests: Recent Labs  Lab 01/23/20 1143  AST 22  ALT 18  ALKPHOS 58  BILITOT 0.9  PROT 7.5  ALBUMIN 3.8   No results for input(s): LIPASE, AMYLASE in the last 168 hours. No results for input(s): AMMONIA in the last 168 hours.  CBC: Recent Labs  Lab 01/23/20 1143 01/24/20 0626  WBC 9.6 10.2  HGB 11.3* 10.7*  HCT 37.6 37.2  MCV 98.7 100.8*   PLT 193 197    Cardiac Enzymes: No results for input(s): CKTOTAL, CKMB, CKMBINDEX, TROPONINI in the last 168 hours.  BNP: Invalid input(s): POCBNP  CBG: Recent Labs  Lab 01/23/20 2227  GLUCAP 170*    Microbiology: Results for orders placed or performed during the hospital encounter of 01/23/20  Resp Panel by RT-PCR (Flu A&B, Covid) Nasopharyngeal Swab     Status: None   Collection Time: 01/23/20 11:43 AM   Specimen: Nasopharyngeal Swab; Nasopharyngeal(NP) swabs in vial transport medium  Result Value Ref Range Status   SARS Coronavirus 2 by RT PCR NEGATIVE NEGATIVE Final    Comment: (NOTE) SARS-CoV-2 target nucleic acids are NOT DETECTED.  The SARS-CoV-2 RNA is generally detectable in upper respiratory specimens during the acute phase of infection. The lowest concentration of SARS-CoV-2 viral copies this assay can detect is 138 copies/mL. A negative result does not preclude SARS-Cov-2 infection and should not be used as the sole basis for treatment or other patient management decisions. A negative result may occur with  improper specimen collection/handling, submission of specimen other than nasopharyngeal swab, presence of viral mutation(s) within the areas targeted by this assay, and inadequate number of viral copies(<138 copies/mL). A negative result must be combined with clinical observations, patient history, and epidemiological information. The expected result is Negative.  Fact Sheet for Patients:  EntrepreneurPulse.com.au  Fact  Sheet for Healthcare Providers:  IncredibleEmployment.be  This test is no t yet approved or cleared by the [place] FDA and  has been authorized for detection and/or diagnosis of SARS-CoV-2 by FDA under an Emergency Use Authorization (EUA). This EUA will remain  in effect (meaning this test can be used) for the duration of the COVID-19 declaration under Section 564(b)(1) of the Act,  21 U.S.C.section 360bbb-3(b)(1), unless the authorization is terminated  or revoked sooner.       Influenza A by PCR NEGATIVE NEGATIVE Final   Influenza B by PCR NEGATIVE NEGATIVE Final    Comment: (NOTE) The Xpert Xpress SARS-CoV-2/FLU/RSV plus assay is intended as an aid in the diagnosis of influenza from Nasopharyngeal swab specimens and should not be used as a sole basis for treatment. Nasal washings and aspirates are unacceptable for Xpert Xpress SARS-CoV-2/FLU/RSV testing.  Fact Sheet for Patients: EntrepreneurPulse.com.au  Fact Sheet for Healthcare Providers: IncredibleEmployment.be  This test is not yet approved or cleared by the [place] FDA and has been authorized for detection and/or diagnosis of SARS-CoV-2 by FDA under an Emergency Use Authorization (EUA). This EUA will remain in effect (meaning this test can be used) for the duration of the COVID-19 declaration under Section 564(b)(1) of the Act, 21 U.S.C. section 360bbb-3(b)(1), unless the authorization is terminated or revoked.  Performed at [facility], [address] [place], [place] [address]     Coagulation Studies: Recent Labs    01/23/20 1142 01/24/20 0626  LABPROT 13.9 13.9  INR 1.1 1.1    Urinalysis: No results for input(s): COLORURINE, LABSPEC, PHURINE, GLUCOSEU, HGBUR, BILIRUBINUR, KETONESUR, PROTEINUR, UROBILINOGEN, NITRITE, LEUKOCYTESUR in the last 72 hours.  Invalid input(s): APPERANCEUR    Imaging: DG Chest Portable 1 View  Result Date: 01/23/2020 CLINICAL DATA:  Increasing shortness of breath. History of CHF and hypertension. EXAM: PORTABLE CHEST 1 VIEW COMPARISON:  09/07/2019 FINDINGS: Cardiac silhouette is mildly enlarged. Prosthetic endovascular aortic valve. No mediastinal or hilar masses. Lungs demonstrate vascular congestion with mild interstitial prominence similar to the prior radiograph. No lung consolidation. No convincing  pleural effusion and no pneumothorax. Stable surgical vascular clips project over the left upper hemithorax. Previous cervical spine fusion, stable. Skeletal structures are grossly intact. IMPRESSION: 1. Cardiomegaly vascular congestion and mild interstitial prominence. Suspect mild congestive heart failure. No evidence of pneumonia. Electronically Signed   By: Lajean Manes M.D.   On: 01/23/2020 12:19     Medications:   . sodium chloride    . sodium chloride    . heparin 1,000 Units/hr (01/24/20 1531)   . amiodarone  200 mg Oral Daily  . atorvastatin  40 mg Oral Daily  . Chlorhexidine Gluconate Cloth  6 each Topical Q0600  . DULoxetine  30 mg Oral Daily  . insulin glargine  45 Units Subcutaneous QHS  . levothyroxine  88 mcg Oral QAC breakfast  . midodrine  10 mg Oral Q M,W,F-HD  . [START ON 01/25/2020] Vitamin D (Ergocalciferol)  50,000 Units Oral Q Tue  . warfarin  3 mg Oral ONCE-1600  . Warfarin - Pharmacist Dosing Inpatient   Does not apply q1600   sodium chloride, sodium chloride, alteplase, heparin, lidocaine (PF), lidocaine-prilocaine, ondansetron **OR** ondansetron (ZOFRAN) IV, oxyCODONE-acetaminophen **AND** oxyCODONE, pentafluoroprop-tetrafluoroeth, polyethylene glycol  Assessment/ Plan:  [name] is a 69 y.o.  female with medical problems including Diabetes Type 2, HF with EF of 45%, ESRD on HD MWF, presented to the ED on 01/22/2020 for SOB.  #  ESRD on HD MWF #Volume overload [facility] [place],MWF, LUA AVF  Received urgent dialysis last night Dr.Lateef discussed with the patient and family regarding additional dialysis on Saturdays moving forward Patient and daughter in agreement with the plan Plan to dialyze patient again today  # Hyperkalemia Potassium 5.2 today Will use low K+ bath for dialysis  #Anemia of CKD Lab Results  Component Value Date   HGB 10.7 (L) 01/24/2020  No acute indication for Epogen  #Secondary Hyperparathyroidism Lab Results   Component Value Date   PTH 202 (H) 06/11/2019   CALCIUM 9.2 01/24/2020   CAION 1.14 (L) 01/29/2019   PHOS 5.4 (H) 01/24/2020  Will continue monitoring bone mineral metabolism parameters  #Diabetes Type 2 with CKD Lab Results  Component Value Date   HGBA1C 6.8 (H) 09/07/2019  Blood glucose readings slightly above goal Continue Insulin Glargine   LOS: 0 Kieth Hartis 12/6/20215:42 PM

## 2020-01-24 NOTE — Consult Note (Deleted)
ANTICOAGULATION CONSULT NOTE  Pharmacy Consult for Warfarin Indication: Mechanical valve  Allergies  Allergen Reactions  . Buspirone Other (See Comments)    Weakness  . Diltiazem Hcl Palpitations  . Gabapentin Palpitations  . Hydralazine Rash  . Lisinopril Cough  . Lovastatin Palpitations  . Metformin Diarrhea  . Pravastatin Other (See Comments)    Insomnia  . Sitagliptin Other (See Comments)    Constipation  . Tramadol Itching    Patient Measurements: Height: 5\' 3"  (160 cm) Weight: 107.5 kg (236 lb 14.4 oz) IBW/kg (Calculated) : 52.4  Vital Signs: Temp: 98 F (36.7 C) (12/06 1259) Temp Source: Oral (12/06 1259) BP: 114/94 (12/06 1330) Pulse Rate: 90 (12/06 1330)  Labs: Recent Labs    01/23/20 1142 01/23/20 1143 01/23/20 1526 01/24/20 0626  HGB  --  11.3*  --  10.7*  HCT  --  37.6  --  37.2  PLT  --  193  --  197  LABPROT 13.9  --   --  13.9  INR 1.1  --   --  1.1  CREATININE  --  6.26*  --  4.90*  TROPONINIHS  --  78* 82*  --     Estimated Creatinine Clearance: 12.7 mL/min (A) (by C-G formula based on SCr of 4.9 mg/dL (H)).   Medical History: Past Medical History:  Diagnosis Date  . Anemia   . Anxiety   . Aortic valve stenosis   . Arrhythmia    atrial fibrillation  . Arthritis    feet, legs  . B12 deficiency   . Bowel obstruction ([place])   . CHF (congestive heart failure) ([place])   . CKD (chronic kidney disease)    protein in urine  . Colostomy in place [facility])   . Diabetes mellitus without complication ([place])   . Dialysis patient [facility])    T/Th/Sa  . Diverticulitis large intestine   . Dysrhythmia   . Ectopic atrial tachycardia ([place])   . FSGS (focal segmental glomerulosclerosis)   . Gastritis   . GI bleed   . Heart murmur   . Hyperlipidemia   . Hypertension   . Hypothyroidism   . Hypothyroidism    unspecified  . MRSA (methicillin resistant Staphylococcus aureus)    at abdominal wound.  Jan 2017.  Treated.   . Neuropathy   . Neuropathy in  diabetes ([place])   . Obesity   . Pedal edema   . Psoriasis   . Shortness of breath dyspnea   . Sleep apnea    CPAP  . Vertigo     Medications:  Warfarin 1.5mg  daily; last dose was 12/4 - pharmacy tech confirmed dose with patient's daughter  Assessment: 69yo female with a past medical history of anemia, anxiety, diabetes, CHF, CKD ESRD (hemodialysis MWF), hypertension, obesity, presents to the emergency department for shortness of breath. Patient was on Eliquis in the past and was switched to warfarin around October 2021. Pharmacy has been consulted for warfarin dosing and monitoring for afib and s/p TAVR (2019). Patient follows at the [facility]. Hgb 11.3 slightly decreased but appears to be patient's baseline. Spoke with patient's daughter - she stated patient was previously on warfarin 2mg  and was changed to 1mg  around 12/19/19 due to having darker stools, then patient was recently changed to warfarin 1.5mg  12/2.   DDI: amiodarone and levothyroxine (also PTA meds)   Date INR Dose 12/5 1.1  not given 12/6     1.1   Goal of Therapy:  INR  2-3 Monitor platelets by anticoagulation protocol: Yes   Plan:  INR 1.1 - subtherapeutic; Will give warfarin 3mg  tonight Monitor INR daily and adjust dose as clinically indicated Monitor CBC per protocol  Paulina Fusi, PharmD, BCPS 01/24/2020 2:46 PM

## 2020-01-24 NOTE — Progress Notes (Addendum)
PROGRESS NOTE    [name]  ZTI:[id number] DOB: October 25, 1950 DOA: 01/23/2020 PCP: Adin Hector, MD   Brief Narrative: Taken from H&P  [name] is a 69 y.o. female with medical history significant for insulin-dependent diabetes mellitus type 2, heart failure with reduced ejection fraction LVEF of 45%, end-stage renal disease on hemodialysis, obstructive sleep apnea not wearing CPAP, hypertension, aortic atherosclerosis, persistent atrial fibrillation, status post TAVR, morbid obesity, acquired hypothyroid, history of focal segmental glomerulosclerosis, presented to the emergency department via EMS for chief concerns of shortness of breath.  Shortness of breath that started progressively worsened throughout the day 01/22/20. Daughter denies fever, nausea, vomiting, chest pain, abdominal pain, dysuria, hematuria, diarrhea, blood in her stool. Patient and daughter states that she used her home oxygen without much relief.  Family endorses new increased productive cough with yellow mucus throughout the day. Endorses increased swelling in her legs.   Patient has frequent recent hospitalizations at [facility] with similar symptoms.  Also found to have reduced EF to 25% on repeat echo and a valvular thrombus.  Her Eliquis was transitioned to warfarin.  Also recent GI bleed with aspirin and warfarin, initially discontinue both and then later started on warfarin only. She was also recommended to have dialysis 4 times a week to prevent volume overload.  Hardly makes any urine, volume is being managed with dialysis.  Cardiology was consulted.  Subjective: Patient continued to feel short of breath, using accessory muscles.  She was off from the BiPAP but requiring 6 L of oxygen, baseline of 3 L. Dr. Clayborn Bigness from cardiology was also in the room.  Assessment & Plan:   Principal Problem:   Acute hypoxemic respiratory failure (HCC) Active Problems:   Obesity   Anemia   Benign essential HTN    Acquired atrophy of thyroid   FSGS (focal segmental glomerulosclerosis)   Acute kidney injury superimposed on chronic kidney disease (HCC)   S/P TAVR (transcatheter aortic valve replacement)   DM type 2 with diabetic peripheral neuropathy (HCC)   Acute respiratory failure with hypoxia (HCC)   DDD (degenerative disc disease), lumbosacral   OSA (obstructive sleep apnea)  Acute on chronic hypoxic respiratory failure.  Most likely secondary to CHF exacerbation.  Patient initially required BiPAP, not transition to [place] but continued to require more than her baseline.  She also received IV Lasix by ED provider, per patient she hardly make any urine.  Recent echocardiogram with worsening of her EF to 25%, she also had right and left cardiac catheterization recently done at [facility]. Mildly elevated troponin most likely secondary to demand ischemia. -Volume is being managed with dialysis-going for dialysis today. -Might need more frequent dialysis to prevent recurrent hospitalizations with similar symptoms. -Continue with supportive care. -Cardiology was consulted-appreciate their recommendations -Cardiology recommending starting her on Entresto.  ESRD on HD(MWF).  Per patient she is quite compliant with her dialysis. She also watch her diet and restrict her fluid. -Continue with dialysis-she might need four dialysis per week to avoid fluid overload. -Nephrology is managing her dialysis needs.  Persistent A. fib.  She was previously on Eliquis which was transitioned to warfarin due to history of aortic valve thrombosis. -Continue with amiodarone -Continue warfarin per pharmacy.  History of chronic pain. -Continue home pain meds  Aortic stenosis s/p TAVR.  History of recent aortic valve thrombosis. -Continue Coumadin.  Insulin-dependent diabetes mellitus. -Continue SSI -She will get benefit with SGLT2 inhibitor on discharge for heart failure.  Obstructive sleep  apnea.  She is unable to tolerate  auto CPAP. She will need outpatient pulmonology referral for further recommendations.  Hypothyroidism. -Continue home Synthroid.   Objective: Vitals:   01/24/20 1200 01/24/20 1259 01/24/20 1330 01/24/20 1443  BP: 105/67 113/65 (!) 114/94   Pulse: 90 100 90   Resp: 20 17 20    Temp:  98 F (36.7 C)    TempSrc:  Oral    SpO2: 94% (!) 89% 91%   Weight:    107.5 kg  Height:    5\' 3"  (1.6 m)   No intake or output data in the 24 hours ending 01/24/20 1513 Filed Weights   01/23/20 1127 01/24/20 1443  Weight: 104.3 kg 107.5 kg    Examination:  General exam: Appears calm and comfortable  Respiratory system: Decreased breath sound at bases, using accessory muscles. Cardiovascular system: S1 & S2 heard, RRR.  Gastrointestinal system: Soft, nontender, nondistended, bowel sounds positive. Central nervous system: Alert and oriented. No focal neurological deficits. Extremities: 1+ LE edema, no cyanosis, pulses intact and symmetrical. Skin: No rashes, lesions or ulcers Psychiatry: Judgement and insight appear normal. Mood & affect appropriate.    DVT prophylaxis: Coumadin Code Status: Full Family Communication: Discussed with patient Disposition Plan:  Status is: Inpatient  Remains inpatient appropriate because:Inpatient level of care appropriate due to severity of illness   Dispo: The patient is from: Home              Anticipated d/c is to: Home              Anticipated d/c date is: 1 day              Patient currently is not medically stable to d/c.   Consultants:   Cardiology  Nephrology  Procedures:  Antimicrobials:   Data Reviewed: I have personally reviewed following labs and imaging studies  CBC: Recent Labs  Lab 01/23/20 1143 01/24/20 0626  WBC 9.6 10.2  HGB 11.3* 10.7*  HCT 37.6 37.2  MCV 98.7 100.8*  PLT 193 242   Basic Metabolic Panel: Recent Labs  Lab 01/23/20 1143 01/24/20 0626  NA 135 136  K 4.5 5.6*  CL 95* 97*  CO2 28 28  GLUCOSE  214* 170*  BUN 41* 28*  CREATININE 6.26* 4.90*  CALCIUM 8.8* 9.2  PHOS  --  5.4*   GFR: Estimated Creatinine Clearance: 12.7 mL/min (A) (by C-G formula based on SCr of 4.9 mg/dL (H)). Liver Function Tests: Recent Labs  Lab 01/23/20 1143  AST 22  ALT 18  ALKPHOS 58  BILITOT 0.9  PROT 7.5  ALBUMIN 3.8   No results for input(s): LIPASE, AMYLASE in the last 168 hours. No results for input(s): AMMONIA in the last 168 hours. Coagulation Profile: Recent Labs  Lab 01/23/20 1142 01/24/20 0626  INR 1.1 1.1   Cardiac Enzymes: No results for input(s): CKTOTAL, CKMB, CKMBINDEX, TROPONINI in the last 168 hours. BNP (last 3 results) No results for input(s): PROBNP in the last 8760 hours. HbA1C: No results for input(s): HGBA1C in the last 72 hours. CBG: Recent Labs  Lab 01/23/20 2227  GLUCAP 170*   Lipid Profile: No results for input(s): CHOL, HDL, LDLCALC, TRIG, CHOLHDL, LDLDIRECT in the last 72 hours. Thyroid Function Tests: Recent Labs    01/23/20 1142  TSH 6.056*   Anemia Panel: No results for input(s): VITAMINB12, FOLATE, FERRITIN, TIBC, IRON, RETICCTPCT in the last 72 hours. Sepsis Labs: No results for input(s): PROCALCITON, LATICACIDVEN in the  last 168 hours.  Recent Results (from the past 240 hour(s))  Resp Panel by RT-PCR (Flu A&B, Covid) Nasopharyngeal Swab     Status: None   Collection Time: 01/23/20 11:43 AM   Specimen: Nasopharyngeal Swab; Nasopharyngeal(NP) swabs in vial transport medium  Result Value Ref Range Status   SARS Coronavirus 2 by RT PCR NEGATIVE NEGATIVE Final    Comment: (NOTE) SARS-CoV-2 target nucleic acids are NOT DETECTED.  The SARS-CoV-2 RNA is generally detectable in upper respiratory specimens during the acute phase of infection. The lowest concentration of SARS-CoV-2 viral copies this assay can detect is 138 copies/mL. A negative result does not preclude SARS-Cov-2 infection and should not be used as the sole basis for treatment  or other patient management decisions. A negative result may occur with  improper specimen collection/handling, submission of specimen other than nasopharyngeal swab, presence of viral mutation(s) within the areas targeted by this assay, and inadequate number of viral copies(<138 copies/mL). A negative result must be combined with clinical observations, patient history, and epidemiological information. The expected result is Negative.  Fact Sheet for Patients:  EntrepreneurPulse.com.au  Fact Sheet for Healthcare Providers:  IncredibleEmployment.be  This test is no t yet approved or cleared by the [place] FDA and  has been authorized for detection and/or diagnosis of SARS-CoV-2 by FDA under an Emergency Use Authorization (EUA). This EUA will remain  in effect (meaning this test can be used) for the duration of the COVID-19 declaration under Section 564(b)(1) of the Act, 21 U.S.C.section 360bbb-3(b)(1), unless the authorization is terminated  or revoked sooner.       Influenza A by PCR NEGATIVE NEGATIVE Final   Influenza B by PCR NEGATIVE NEGATIVE Final    Comment: (NOTE) The Xpert Xpress SARS-CoV-2/FLU/RSV plus assay is intended as an aid in the diagnosis of influenza from Nasopharyngeal swab specimens and should not be used as a sole basis for treatment. Nasal washings and aspirates are unacceptable for Xpert Xpress SARS-CoV-2/FLU/RSV testing.  Fact Sheet for Patients: EntrepreneurPulse.com.au  Fact Sheet for Healthcare Providers: IncredibleEmployment.be  This test is not yet approved or cleared by the [place] FDA and has been authorized for detection and/or diagnosis of SARS-CoV-2 by FDA under an Emergency Use Authorization (EUA). This EUA will remain in effect (meaning this test can be used) for the duration of the COVID-19 declaration under Section 564(b)(1) of the Act, 21 U.S.C. section  360bbb-3(b)(1), unless the authorization is terminated or revoked.  Performed at [facility], [address], [place], [place] [address]      Radiology Studies: DG Chest Portable 1 View  Result Date: 01/23/2020 CLINICAL DATA:  Increasing shortness of breath. History of CHF and hypertension. EXAM: PORTABLE CHEST 1 VIEW COMPARISON:  09/07/2019 FINDINGS: Cardiac silhouette is mildly enlarged. Prosthetic endovascular aortic valve. No mediastinal or hilar masses. Lungs demonstrate vascular congestion with mild interstitial prominence similar to the prior radiograph. No lung consolidation. No convincing pleural effusion and no pneumothorax. Stable surgical vascular clips project over the left upper hemithorax. Previous cervical spine fusion, stable. Skeletal structures are grossly intact. IMPRESSION: 1. Cardiomegaly vascular congestion and mild interstitial prominence. Suspect mild congestive heart failure. No evidence of pneumonia. Electronically Signed   By: Lajean Manes M.D.   On: 01/23/2020 12:19    Scheduled Meds: . amiodarone  200 mg Oral Daily  . atorvastatin  40 mg Oral Daily  . Chlorhexidine Gluconate Cloth  6 each Topical Q0600  . DULoxetine  30 mg Oral Daily  .  insulin glargine  45 Units Subcutaneous QHS  . levothyroxine  88 mcg Oral QAC breakfast  . midodrine  10 mg Oral Q M,W,F-HD  . [START ON 01/25/2020] Vitamin D (Ergocalciferol)  50,000 Units Oral Q Tue  . warfarin  3 mg Oral ONCE-1600  . Warfarin - Pharmacist Dosing Inpatient   Does not apply q1600   Continuous Infusions: . sodium chloride    . sodium chloride    . heparin       LOS: 0 days   Time spent: 40 minutes.  Lorella Nimrod, MD [facility]  If 7PM-7AM, please contact night-coverage [email or web address]  01/24/2020, 3:13 PM   This record has been created using Systems analyst. Errors have been sought and corrected,but may not always be located. Such creation errors do not reflect  on the standard of care.

## 2020-01-25 ENCOUNTER — Inpatient Hospital Stay
Admission: EM | Disposition: A | Discharge status: Home / Self Care | Payer: Self-pay | Source: Home / Self Care | Attending: Obstetrics and Gynecology

## 2020-01-25 DIAGNOSIS — N186 End stage renal disease: Secondary | ICD-10-CM

## 2020-01-25 DIAGNOSIS — T82898A Other specified complication of vascular prosthetic devices, implants and grafts, initial encounter: Secondary | ICD-10-CM

## 2020-01-25 DIAGNOSIS — Z992 Dependence on renal dialysis: Secondary | ICD-10-CM

## 2020-01-25 HISTORY — PX: A/V SHUNT INTERVENTION: CATH118220

## 2020-01-25 LAB — GLUCOSE, CAPILLARY
Glucose-Capillary: 202 mg/dL — ABNORMAL HIGH (ref 70–99)
Glucose-Capillary: 136 mg/dL — ABNORMAL HIGH (ref 70–99)

## 2020-01-25 LAB — HEPARIN LEVEL (UNFRACTIONATED)
Heparin Unfractionated: 0.14 IU/mL — ABNORMAL LOW (ref 0.30–0.70)
Heparin Unfractionated: 0.22 IU/mL — ABNORMAL LOW (ref 0.30–0.70)

## 2020-01-25 LAB — CBC
HCT: 36.8 % (ref 36.0–46.0)
Hemoglobin: 10.9 g/dL — ABNORMAL LOW (ref 12.0–15.0)
MCH: 29.8 pg (ref 26.0–34.0)
MCHC: 29.6 g/dL — ABNORMAL LOW (ref 30.0–36.0)
MCV: 100.5 fL — ABNORMAL HIGH (ref 80.0–100.0)
Platelets: 182 10*3/uL (ref 150–400)
RBC: 3.66 MIL/uL — ABNORMAL LOW (ref 3.87–5.11)
RDW: 15.9 % — ABNORMAL HIGH (ref 11.5–15.5)
WBC: 10.6 10*3/uL — ABNORMAL HIGH (ref 4.0–10.5)
nRBC: 0 % (ref 0.0–0.2)

## 2020-01-25 LAB — PROTIME-INR
INR: 1.1 (ref 0.8–1.2)
Prothrombin Time: 13.8 seconds (ref 11.4–15.2)

## 2020-01-25 LAB — HEPATITIS B DNA, ULTRAQUANTITATIVE, PCR
HBV DNA SERPL PCR-ACNC: NOT DETECTED IU/mL
HBV DNA SERPL PCR-LOG IU: UNDETERMINED log10 IU/mL

## 2020-01-25 LAB — PARATHYROID HORMONE, INTACT (NO CA): PTH: 173 pg/mL — ABNORMAL HIGH (ref 15–65)

## 2020-01-25 LAB — HEPATITIS B SURFACE ANTIBODY, QUANTITATIVE: Hep B S AB Quant (Post): <3.1 m[IU]/mL — ABNORMAL LOW (ref >9.9)

## 2020-01-25 SURGERY — A/V SHUNT INTERVENTION
Anesthesia: Moderate Sedation

## 2020-01-25 MED ORDER — HEPARIN SODIUM (PORCINE) 1000 UNIT/ML IJ SOLN
INTRAMUSCULAR | Status: DC | PRN
  Administered 2020-01-25: 3000 [IU] via INTRAVENOUS

## 2020-01-25 MED ORDER — MIDAZOLAM HCL 5 MG/5ML IJ SOLN
INTRAMUSCULAR | Status: AC
  Filled 2020-01-25: qty 5

## 2020-01-25 MED ORDER — IPRATROPIUM-ALBUTEROL 0.5-2.5 (3) MG/3ML IN SOLN
RESPIRATORY_TRACT | Status: AC
  Filled 2020-01-25: qty 3

## 2020-01-25 MED ORDER — FENTANYL CITRATE (PF) 100 MCG/2ML IJ SOLN
INTRAMUSCULAR | Status: AC
  Filled 2020-01-25: qty 2

## 2020-01-25 MED ORDER — FENTANYL CITRATE (PF) 100 MCG/2ML IJ SOLN
INTRAMUSCULAR | Status: DC | PRN
  Administered 2020-01-25 (×2): 25 ug via INTRAVENOUS

## 2020-01-25 MED ORDER — CEFAZOLIN SODIUM-DEXTROSE 2-4 GM/100ML-% IV SOLN
2.0000 g | INTRAVENOUS | Status: AC
  Administered 2020-01-25: 2 g via INTRAVENOUS

## 2020-01-25 MED ORDER — HEPARIN SODIUM (PORCINE) 1000 UNIT/ML IJ SOLN
INTRAMUSCULAR | Status: AC
  Filled 2020-01-25: qty 1

## 2020-01-25 MED ORDER — WARFARIN SODIUM 3 MG PO TABS
3.0000 mg | ORAL_TABLET | Freq: Once | ORAL | Status: DC
  Filled 2020-01-25: qty 1

## 2020-01-25 MED ORDER — IODIXANOL 320 MG/ML IV SOLN
INTRAVENOUS | Status: DC | PRN
  Administered 2020-01-25: 35 mL

## 2020-01-25 MED ORDER — HEPARIN BOLUS VIA INFUSION
1000.0000 [IU] | Freq: Once | INTRAVENOUS | Status: AC
  Administered 2020-01-25: 1000 [IU] via INTRAVENOUS
  Filled 2020-01-25: qty 1000

## 2020-01-25 MED ORDER — MIDAZOLAM HCL 2 MG/2ML IJ SOLN
INTRAMUSCULAR | Status: DC | PRN
  Administered 2020-01-25: 1 mg via INTRAVENOUS
  Administered 2020-01-25: 0.5 mg via INTRAVENOUS

## 2020-01-25 MED ORDER — IPRATROPIUM-ALBUTEROL 0.5-2.5 (3) MG/3ML IN SOLN
3.0000 mL | RESPIRATORY_TRACT | Status: AC
  Administered 2020-01-25: 3 mL via RESPIRATORY_TRACT

## 2020-01-25 SURGICAL SUPPLY — 23 items
BALLN ATG 12X4X80 (BALLOONS) ×3
BALLN LUTONIX AV 10X40X75 (BALLOONS) ×3
BALLN LUTONIX AV 8X60X75 (BALLOONS) ×3
BALLOON ATG 12X4X80 (BALLOONS) IMPLANT
BALLOON LUTONIX AV 10X40X75 (BALLOONS) IMPLANT
BALLOON LUTONIX AV 8X60X75 (BALLOONS) IMPLANT
CATH BEACON 5 .035 40 KMP TP (CATHETERS) IMPLANT
CATH BEACON 5 .038 40 KMP TP (CATHETERS) ×3
DEVICE TORQUE .025-.038 (MISCELLANEOUS) ×2 IMPLANT
DRAPE BRACHIAL (DRAPES) ×2 IMPLANT
GUIDEWIRE ANGLED .035 180CM (WIRE) ×2 IMPLANT
KIT ENCORE 26 ADVANTAGE (KITS) ×2 IMPLANT
NDL ENTRY 21GA 7CM ECHOTIP (NEEDLE) IMPLANT
NEEDLE ENTRY 21GA 7CM ECHOTIP (NEEDLE) ×3 IMPLANT
PACK ANGIOGRAPHY (CUSTOM PROCEDURE TRAY) ×2 IMPLANT
SET INTRO CAPELLA COAXIAL (SET/KITS/TRAYS/PACK) ×2 IMPLANT
SHEATH BRITE TIP 6FRX5.5 (SHEATH) ×2 IMPLANT
SHEATH BRITE TIP 8FR 5.5 (SHEATH) ×2 IMPLANT
SUT MNCRL 4-0 (SUTURE) ×3
SUT MNCRL 4-0 27XMFL (SUTURE) ×1
SUTURE MNCRL 4-0 27XMF (SUTURE) IMPLANT
TOWEL OR 17X26 4PK STRL BLUE (TOWEL DISPOSABLE) ×2 IMPLANT
WIRE MAGIC TOR.035 180C (WIRE) ×2 IMPLANT

## 2020-01-25 NOTE — Progress Notes (Signed)
[name]  ROUNDING NOTE   Subjective:   Patient resting in bed, in no acute distress.  She still requires 6 L of supplemental O2 via nasal cannula, short of breath with mild exertion, accessory muscle use noted.  She did not get her dialysis treatment yesterday due to complications of her fistula.  Vascular team planning for fistulogram today.  We will plan for dialysis after vascular procedure.  Objective:  Vital signs in last 24 hours:  Temp:  [97.4 F (36.3 C)-98.7 F (37.1 C)] 97.4 F (36.3 C) (12/07 1221) Pulse Rate:  [86-112] 97 (12/07 1445) Resp:  [15-25] 16 (12/07 1445) BP: (79-135)/(54-83) 113/78 (12/07 1445) SpO2:  [91 %-100 %] 94 % (12/07 1445) Weight:  [107.6 kg] 107.6 kg (12/07 0420)  Weight change: 3.13 kg Filed Weights   01/23/20 1127 01/24/20 1443 01/25/20 0420  Weight: 104.3 kg 107.5 kg 107.6 kg    Intake/Output: I/O last 3 completed shifts: In: 440 [P.O.:360; I.V.:80] Out: 0    Intake/Output this shift:  No intake/output data recorded.  Physical Exam: General:  No acute distress  Head: Moist oral mucosal membranes  Eyes:  Sclerae and conjunctivae clear  Lungs:   Dyspneic on minimal exertion, lungs diminished bilaterally, on 6 L O2 via nasal cannula  Heart:  S1-S2, no rubs or gallops  Abdomen:  Soft, nontender, nondistended  Extremities:  Trace peripheral edema.  Neurologic:  Awake, alert, speech clear and appropriate  Skin: No acute  Lesions or rashes   Access: LUA AVF +bruit , +thrill    Basic Metabolic Panel: Recent Labs  Lab 01/23/20 1143 01/24/20 0626  NA 135 136  K 4.5 5.6*  CL 95* 97*  CO2 28 28  GLUCOSE 214* 170*  BUN 41* 28*  CREATININE 6.26* 4.90*  CALCIUM 8.8* 9.2  PHOS  --  5.4*    Liver Function Tests: Recent Labs  Lab 01/23/20 1143  AST 22  ALT 18  ALKPHOS 58  BILITOT 0.9  PROT 7.5  ALBUMIN 3.8   No results for input(s): LIPASE, AMYLASE in the last 168 hours. No results for input(s): AMMONIA in  the last 168 hours.  CBC: Recent Labs  Lab 01/23/20 1143 01/24/20 0626 01/25/20 0704  WBC 9.6 10.2 10.6*  HGB 11.3* 10.7* 10.9*  HCT 37.6 37.2 36.8  MCV 98.7 100.8* 100.5*  PLT 193 197 182    Cardiac Enzymes: No results for input(s): CKTOTAL, CKMB, CKMBINDEX, TROPONINI in the last 168 hours.  BNP: Invalid input(s): POCBNP  CBG: Recent Labs  Lab 01/23/20 2227 01/24/20 1803 01/24/20 2057 01/25/20 1144  GLUCAP 170* 121* 149* 202*    Microbiology: Results for orders placed or performed during the hospital encounter of 01/23/20  Resp Panel by RT-PCR (Flu A&B, Covid) Nasopharyngeal Swab     Status: None   Collection Time: 01/23/20 11:43 AM   Specimen: Nasopharyngeal Swab; Nasopharyngeal(NP) swabs in vial transport medium  Result Value Ref Range Status   SARS Coronavirus 2 by RT PCR NEGATIVE NEGATIVE Final    Comment: (NOTE) SARS-CoV-2 target nucleic acids are NOT DETECTED.  The SARS-CoV-2 RNA is generally detectable in upper respiratory specimens during the acute phase of infection. The lowest concentration of SARS-CoV-2 viral copies this assay can detect is 138 copies/mL. A negative result does not preclude SARS-Cov-2 infection and should not be used as the sole basis for treatment or other patient management decisions. A negative result may occur with  improper specimen collection/handling, submission of specimen other than  nasopharyngeal swab, presence of viral mutation(s) within the areas targeted by this assay, and inadequate number of viral copies(<138 copies/mL). A negative result must be combined with clinical observations, patient history, and epidemiological information. The expected result is Negative.  Fact Sheet for Patients:  EntrepreneurPulse.com.au  Fact Sheet for Healthcare Providers:  IncredibleEmployment.be  This test is no t yet approved or cleared by the [place] FDA and  has been authorized for  detection and/or diagnosis of SARS-CoV-2 by FDA under an Emergency Use Authorization (EUA). This EUA will remain  in effect (meaning this test can be used) for the duration of the COVID-19 declaration under Section 564(b)(1) of the Act, 21 U.S.C.section 360bbb-3(b)(1), unless the authorization is terminated  or revoked sooner.       Influenza A by PCR NEGATIVE NEGATIVE Final   Influenza B by PCR NEGATIVE NEGATIVE Final    Comment: (NOTE) The Xpert Xpress SARS-CoV-2/FLU/RSV plus assay is intended as an aid in the diagnosis of influenza from Nasopharyngeal swab specimens and should not be used as a sole basis for treatment. Nasal washings and aspirates are unacceptable for Xpert Xpress SARS-CoV-2/FLU/RSV testing.  Fact Sheet for Patients: EntrepreneurPulse.com.au  Fact Sheet for Healthcare Providers: IncredibleEmployment.be  This test is not yet approved or cleared by the [place] FDA and has been authorized for detection and/or diagnosis of SARS-CoV-2 by FDA under an Emergency Use Authorization (EUA). This EUA will remain in effect (meaning this test can be used) for the duration of the COVID-19 declaration under Section 564(b)(1) of the Act, 21 U.S.C. section 360bbb-3(b)(1), unless the authorization is terminated or revoked.  Performed at [facility], [address] [place], [place] [address]     Coagulation Studies: Recent Labs    01/23/20 1142 01/24/20 0626 01/25/20 0704  LABPROT 13.9 13.9 13.8  INR 1.1 1.1 1.1    Urinalysis: No results for input(s): COLORURINE, LABSPEC, PHURINE, GLUCOSEU, HGBUR, BILIRUBINUR, KETONESUR, PROTEINUR, UROBILINOGEN, NITRITE, LEUKOCYTESUR in the last 72 hours.  Invalid input(s): APPERANCEUR    Imaging: PERIPHERAL VASCULAR CATHETERIZATION  Result Date: 01/25/2020 See OP Note    Medications:   . sodium chloride    . sodium chloride    . heparin Stopped (01/25/20 1304)   .  amiodarone  200 mg Oral Daily  . atorvastatin  40 mg Oral Daily  . Chlorhexidine Gluconate Cloth  6 each Topical Q0600  . DULoxetine  30 mg Oral Daily  . fentaNYL      . heparin sodium (porcine)      . insulin glargine  45 Units Subcutaneous QHS  . ipratropium-albuterol      . levothyroxine  88 mcg Oral QAC breakfast  . midazolam      . midodrine  10 mg Oral Q M,W,F-HD  . Vitamin D (Ergocalciferol)  50,000 Units Oral Q Tue  . warfarin  3 mg Oral ONCE-1600  . Warfarin - Pharmacist Dosing Inpatient   Does not apply q1600   sodium chloride, sodium chloride, alteplase, heparin, lidocaine (PF), lidocaine-prilocaine, ondansetron **OR** ondansetron (ZOFRAN) IV, oxyCODONE-acetaminophen **AND** oxyCODONE, pentafluoroprop-tetrafluoroeth, polyethylene glycol  Assessment/ Plan:  [name] is a 69 y.o.  female with medical problems including Diabetes Type 2, HF with EF of 45%, ESRD on HD MWF, presented to the ED on 01/22/2020 for SOB.  #ESRD on HD MWF #Volume overload [facility] [place],MWF, LUA AVF  We were planning dialysis reatment yesterday but  treatment got interrupted due to complications of fistula  Vascular team planning for  fistulogram later today We will dialyze the patient after the procedure  # Hyperkalemia Potassium 5.6 yesterday Dialysis with low potassium bath  #Anemia of CKD Lab Results  Component Value Date   HGB 10.9 (L) 01/25/2020   We will continue monitoring CBCs  #Secondary Hyperparathyroidism Lab Results  Component Value Date   PTH 173 (H) 01/24/2020   CALCIUM 9.2 01/24/2020   CAION 1.14 (L) 01/29/2019   PHOS 5.4 (H) 01/24/2020    #Diabetes Type 2 with CKD Lab Results  Component Value Date   HGBA1C 6.8 (H) 09/07/2019  Continue current antihyperglycemic management   LOS: 1 Zaynah Chawla 12/7/20213:37 PM

## 2020-01-25 NOTE — Progress Notes (Signed)
PROGRESS NOTE    [name]  HYW:[id number] DOB: [birth year] DOA: 01/23/2020 PCP: Adin Hector, MD   Brief Narrative: Taken from H&P  [name] is a 69 y.o. female with medical history significant for insulin-dependent diabetes mellitus type 2, heart failure with reduced ejection fraction LVEF of 45%, end-stage renal disease on hemodialysis, obstructive sleep apnea not wearing CPAP, hypertension, aortic atherosclerosis, persistent atrial fibrillation, status post TAVR, morbid obesity, acquired hypothyroid, history of focal segmental glomerulosclerosis, presented to the emergency department via EMS for chief concerns of shortness of breath.  Shortness of breath that started progressively worsened throughout the day 01/22/20. Daughter denies fever, nausea, vomiting, chest pain, abdominal pain, dysuria, hematuria, diarrhea, blood in her stool. Patient and daughter states that she used her home oxygen without much relief.  Family endorses new increased productive cough with yellow mucus throughout the day. Endorses increased swelling in her legs.   Patient has frequent recent hospitalizations at [facility] with similar symptoms.  Also found to have reduced EF to 25% on repeat echo and a valvular thrombus.  Her Eliquis was transitioned to warfarin.  Also recent GI bleed with aspirin and warfarin, initially discontinue both and then later started on warfarin only. She was also recommended to have dialysis 4 times a week to prevent volume overload.  Hardly makes any urine, volume is being managed with dialysis.  Cardiology was consulted.  Subjective: Patient continue to have shortness of breath requiring BiPAP at different times.  Currently on 6 L of oxygen.  Increased work of breathing.  Apparently did not get her dialysis yesterday as her dialysis site infiltrated.  Going for procedure by vascular today followed by dialysis.  Assessment & Plan:   Principal Problem:   Acute hypoxemic  respiratory failure (HCC) Active Problems:   Obesity   Anemia   Benign essential HTN   Acquired atrophy of thyroid   FSGS (focal segmental glomerulosclerosis)   Acute kidney injury superimposed on chronic kidney disease (HCC)   S/P TAVR (transcatheter aortic valve replacement)   DM type 2 with diabetic peripheral neuropathy (HCC)   Acute respiratory failure with hypoxia (HCC)   DDD (degenerative disc disease), lumbosacral   OSA (obstructive sleep apnea)  Acute on chronic hypoxic respiratory failure.  Most likely secondary to CHF exacerbation. She also received IV Lasix by ED provider, per patient she hardly make any urine.  Unable to get her dialysis yesterday, continue to require BiPAP at times, this morning was barely 90% on 6 L of oxygen.  Baseline of 2-3 L.  Recent echocardiogram with worsening of her EF to 25%, she also had right and left cardiac catheterization recently done at [facility]. Mildly elevated troponin most likely secondary to demand ischemia. -Volume is being managed with dialysis-going for dialysis today after her procedure with vascular surgery for infiltrated dialysis access. -Might need more frequent dialysis to prevent recurrent hospitalizations with similar symptoms. -Continue with supportive care. -Cardiology was consulted-appreciate their recommendations -Cardiology recommending starting her on Entresto.  ESRD on HD(MWF).  Per patient she is quite compliant with her dialysis. She also watch her diet and restrict her fluid. -Continue with dialysis-she might need four dialysis per week to avoid fluid overload. -Nephrology is managing her dialysis needs.  Persistent A. fib.  She was previously on Eliquis which was transitioned to warfarin due to history of aortic valve thrombosis. -Continue with amiodarone -Continue warfarin per pharmacy.  History of chronic pain. -Continue home pain meds  Aortic stenosis s/p  TAVR.  History of recent aortic valve  thrombosis. -Continue Coumadin.  Insulin-dependent diabetes mellitus. -Continue SSI -She will get benefit with SGLT2 inhibitor on discharge for heart failure.  Obstructive sleep apnea.  She is unable to tolerate auto CPAP. She will need outpatient pulmonology referral for further recommendations.  Hypothyroidism. -Continue home Synthroid.  Morbid obesity. Body mass index is 42.04 kg/m.  This will complicate overall prognosis.  Objective: Vitals:   01/25/20 1405 01/25/20 1429 01/25/20 1441 01/25/20 1445  BP: 101/62 115/73  113/78  Pulse: (!) 105 95  97  Resp: (!) 24 17  16   Temp:      TempSrc:      SpO2: 96%  94% 94%  Weight:      Height:        Intake/Output Summary (Last 24 hours) at 01/25/2020 1453 Last data filed at 01/25/2020 0300 Gross per 24 hour  Intake 440 ml  Output 0 ml  Net 440 ml   Filed Weights   01/23/20 1127 01/24/20 1443 01/25/20 0420  Weight: 104.3 kg 107.5 kg 107.6 kg    Examination:  General.  Morbidly obese lady, mildly distressed. Pulmonary.  Bilateral basal crackles with increased work of breathing. CV.  Regular rate and rhythm, no JVD, rub or murmur. Abdomen.  Soft, nontender, nondistended, BS positive. CNS.  Alert and oriented x3.  No focal neurologic deficit. Extremities.  Trace LE edema, no cyanosis, pulses intact and symmetrical. Psychiatry.  Judgment and insight appears normal.  DVT prophylaxis: Coumadin Code Status: Full Family Communication: Discussed with patient Disposition Plan:  Status is: Inpatient  Remains inpatient appropriate because:Inpatient level of care appropriate due to severity of illness   Dispo: The patient is from: Home              Anticipated d/c is to: Home              Anticipated d/c date is: 1 day              Patient currently is not medically stable to d/c.   Consultants:   Cardiology  Nephrology  Procedures:  Antimicrobials:   Data Reviewed: I have personally reviewed following labs and  imaging studies  CBC: Recent Labs  Lab 01/23/20 1143 01/24/20 0626 01/25/20 0704  WBC 9.6 10.2 10.6*  HGB 11.3* 10.7* 10.9*  HCT 37.6 37.2 36.8  MCV 98.7 100.8* 100.5*  PLT 193 197 161   Basic Metabolic Panel: Recent Labs  Lab 01/23/20 1143 01/24/20 0626  NA 135 136  K 4.5 5.6*  CL 95* 97*  CO2 28 28  GLUCOSE 214* 170*  BUN 41* 28*  CREATININE 6.26* 4.90*  CALCIUM 8.8* 9.2  PHOS  --  5.4*   GFR: Estimated Creatinine Clearance: 12.7 mL/min (A) (by C-G formula based on SCr of 4.9 mg/dL (H)). Liver Function Tests: Recent Labs  Lab 01/23/20 1143  AST 22  ALT 18  ALKPHOS 58  BILITOT 0.9  PROT 7.5  ALBUMIN 3.8   No results for input(s): LIPASE, AMYLASE in the last 168 hours. No results for input(s): AMMONIA in the last 168 hours. Coagulation Profile: Recent Labs  Lab 01/23/20 1142 01/24/20 0626 01/25/20 0704  INR 1.1 1.1 1.1   Cardiac Enzymes: No results for input(s): CKTOTAL, CKMB, CKMBINDEX, TROPONINI in the last 168 hours. BNP (last 3 results) No results for input(s): PROBNP in the last 8760 hours. HbA1C: No results for input(s): HGBA1C in the last 72 hours. CBG: Recent Labs  Lab  01/23/20 2227 01/24/20 1803 01/24/20 2057 01/25/20 1144  GLUCAP 170* 121* 149* 202*   Lipid Profile: No results for input(s): CHOL, HDL, LDLCALC, TRIG, CHOLHDL, LDLDIRECT in the last 72 hours. Thyroid Function Tests: Recent Labs    01/23/20 1142  TSH 6.056*   Anemia Panel: No results for input(s): VITAMINB12, FOLATE, FERRITIN, TIBC, IRON, RETICCTPCT in the last 72 hours. Sepsis Labs: No results for input(s): PROCALCITON, LATICACIDVEN in the last 168 hours.  Recent Results (from the past 240 hour(s))  Resp Panel by RT-PCR (Flu A&B, Covid) Nasopharyngeal Swab     Status: None   Collection Time: 01/23/20 11:43 AM   Specimen: Nasopharyngeal Swab; Nasopharyngeal(NP) swabs in vial transport medium  Result Value Ref Range Status   SARS Coronavirus 2 by RT PCR  NEGATIVE NEGATIVE Final    Comment: (NOTE) SARS-CoV-2 target nucleic acids are NOT DETECTED.  The SARS-CoV-2 RNA is generally detectable in upper respiratory specimens during the acute phase of infection. The lowest concentration of SARS-CoV-2 viral copies this assay can detect is 138 copies/mL. A negative result does not preclude SARS-Cov-2 infection and should not be used as the sole basis for treatment or other patient management decisions. A negative result may occur with  improper specimen collection/handling, submission of specimen other than nasopharyngeal swab, presence of viral mutation(s) within the areas targeted by this assay, and inadequate number of viral copies(<138 copies/mL). A negative result must be combined with clinical observations, patient history, and epidemiological information. The expected result is Negative.  Fact Sheet for Patients:  EntrepreneurPulse.com.au  Fact Sheet for Healthcare Providers:  IncredibleEmployment.be  This test is no t yet approved or cleared by the [place] FDA and  has been authorized for detection and/or diagnosis of SARS-CoV-2 by FDA under an Emergency Use Authorization (EUA). This EUA will remain  in effect (meaning this test can be used) for the duration of the COVID-19 declaration under Section 564(b)(1) of the Act, 21 U.S.C.section 360bbb-3(b)(1), unless the authorization is terminated  or revoked sooner.       Influenza A by PCR NEGATIVE NEGATIVE Final   Influenza B by PCR NEGATIVE NEGATIVE Final    Comment: (NOTE) The Xpert Xpress SARS-CoV-2/FLU/RSV plus assay is intended as an aid in the diagnosis of influenza from Nasopharyngeal swab specimens and should not be used as a sole basis for treatment. Nasal washings and aspirates are unacceptable for Xpert Xpress SARS-CoV-2/FLU/RSV testing.  Fact Sheet for Patients: EntrepreneurPulse.com.au  Fact Sheet for  Healthcare Providers: IncredibleEmployment.be  This test is not yet approved or cleared by the [place] FDA and has been authorized for detection and/or diagnosis of SARS-CoV-2 by FDA under an Emergency Use Authorization (EUA). This EUA will remain in effect (meaning this test can be used) for the duration of the COVID-19 declaration under Section 564(b)(1) of the Act, 21 U.S.C. section 360bbb-3(b)(1), unless the authorization is terminated or revoked.  Performed at [facility], [address], [place], [place] [address]      Radiology Studies: PERIPHERAL VASCULAR CATHETERIZATION  Result Date: 01/25/2020 See OP Note   Scheduled Meds: . [MAR Hold] amiodarone  200 mg Oral Daily  . [MAR Hold] atorvastatin  40 mg Oral Daily  . [MAR Hold] Chlorhexidine Gluconate Cloth  6 each Topical Q0600  . [MAR Hold] DULoxetine  30 mg Oral Daily  . fentaNYL      . heparin sodium (porcine)      . [MAR Hold] insulin glargine  45 Units Subcutaneous QHS  .  ipratropium-albuterol      . [MAR Hold] levothyroxine  88 mcg Oral QAC breakfast  . midazolam      . [MAR Hold] midodrine  10 mg Oral Q M,W,F-HD  . [MAR Hold] Vitamin D (Ergocalciferol)  50,000 Units Oral Q Tue  . [MAR Hold] warfarin  3 mg Oral ONCE-1600  . [MAR Hold] Warfarin - Pharmacist Dosing Inpatient   Does not apply q1600   Continuous Infusions: . [MAR Hold] sodium chloride    . [MAR Hold] sodium chloride    . heparin Stopped (01/25/20 1304)     LOS: 1 day   Time spent: 35 minutes.  Lorella Nimrod, MD [facility]  If 7PM-7AM, please contact night-coverage [email or web address]  01/25/2020, 2:53 PM   This record has been created using Systems analyst. Errors have been sought and corrected,but may not always be located. Such creation errors do not reflect on the standard of care.

## 2020-01-25 NOTE — Progress Notes (Signed)
[facility] Cardiology    SUBJECTIVE: Patient still feels shortness of breath denies any chest pain no worsening leg edema did not complete full dialysis treatment yesterday resting comfortably still complains of generalized weakness.  Denies palpitations tachycardia   Vitals:   01/24/20 2059 01/25/20 0016 01/25/20 0420 01/25/20 0752  BP: 100/66 111/74 99/69 103/72  Pulse: 93 88 98 97  Resp: 18  18 17   Temp: 98 F (36.7 C) 97.6 F (36.4 C) 98.4 F (36.9 C) 97.9 F (36.6 C)  TempSrc: Oral Oral Oral Oral  SpO2: 98% 100% 100% 94%  Weight:   107.6 kg   Height:         Intake/Output Summary (Last 24 hours) at 01/25/2020 1104 Last data filed at 01/25/2020 0300 Gross per 24 hour  Intake 440 ml  Output 0 ml  Net 440 ml      PHYSICAL EXAM  General: Well developed, well nourished, in no acute distress HEENT:  Normocephalic and atramatic Neck:  No JVD.  Lungs: Clear bilaterally to auscultation and percussion. Heart: Irregular irregular. Normal S1 and S2 without gallops or murmurs.  Abdomen: Bowel sounds are positive, abdomen soft and non-tender  Msk:  Back normal, normal gait. Normal strength and tone for age. Extremities: No clubbing, cyanosis or edema.   Neuro: Alert and oriented X 3. Psych:  Good affect, responds appropriately   LABS: Basic Metabolic Panel: Recent Labs    01/23/20 1143 01/24/20 0626  NA 135 136  K 4.5 5.6*  CL 95* 97*  CO2 28 28  GLUCOSE 214* 170*  BUN 41* 28*  CREATININE 6.26* 4.90*  CALCIUM 8.8* 9.2  PHOS  --  5.4*   Liver Function Tests: Recent Labs    01/23/20 1143  AST 22  ALT 18  ALKPHOS 58  BILITOT 0.9  PROT 7.5  ALBUMIN 3.8   No results for input(s): LIPASE, AMYLASE in the last 72 hours. CBC: Recent Labs    01/24/20 0626 01/25/20 0704  WBC 10.2 10.6*  HGB 10.7* 10.9*  HCT 37.2 36.8  MCV 100.8* 100.5*  PLT 197 182   Cardiac Enzymes: No results for input(s): CKTOTAL, CKMB, CKMBINDEX, TROPONINI in the last 72 hours. BNP:  Invalid input(s): POCBNP D-Dimer: No results for input(s): DDIMER in the last 72 hours. Hemoglobin A1C: No results for input(s): HGBA1C in the last 72 hours. Fasting Lipid Panel: No results for input(s): CHOL, HDL, LDLCALC, TRIG, CHOLHDL, LDLDIRECT in the last 72 hours. Thyroid Function Tests: Recent Labs    01/23/20 1142  TSH 6.056*   Anemia Panel: No results for input(s): VITAMINB12, FOLATE, FERRITIN, TIBC, IRON, RETICCTPCT in the last 72 hours.  DG Chest Portable 1 View  Result Date: 01/23/2020 CLINICAL DATA:  Increasing shortness of breath. History of CHF and hypertension. EXAM: PORTABLE CHEST 1 VIEW COMPARISON:  09/07/2019 FINDINGS: Cardiac silhouette is mildly enlarged. Prosthetic endovascular aortic valve. No mediastinal or hilar masses. Lungs demonstrate vascular congestion with mild interstitial prominence similar to the prior radiograph. No lung consolidation. No convincing pleural effusion and no pneumothorax. Stable surgical vascular clips project over the left upper hemithorax. Previous cervical spine fusion, stable. Skeletal structures are grossly intact. IMPRESSION: 1. Cardiomegaly vascular congestion and mild interstitial prominence. Suspect mild congestive heart failure. No evidence of pneumonia. Electronically Signed   By: Lajean Manes M.D.   On: 01/23/2020 12:19     Echo echo July 2021 EF around 50%  TELEMETRY: Atrial fibrillation rate of about 100 bundle branch block with to be  stable  ASSESSMENT AND PLAN:  Principal Problem:   Acute hypoxemic respiratory failure (HCC) Active Problems:   Obesity   Anemia   Benign essential HTN   Acquired atrophy of thyroid   FSGS (focal segmental glomerulosclerosis)   Acute kidney injury superimposed on chronic kidney disease (HCC)   S/P TAVR (transcatheter aortic valve replacement)   DM type 2 with diabetic peripheral neuropathy (HCC)   Acute respiratory failure with hypoxia (HCC)   DDD (degenerative disc disease),  lumbosacral   OSA (obstructive sleep apnea)    Plan Continue nephrology input will probably need additional dialysis therapy Supplemental oxygen as necessary Agree with hypertension management and control CPAP BiPAP for obstructive sleep apnea Continue anticoagulation for A. fib status post TAVR transition from heparin back to warfarin Recommend physical therapy for generalized weakness Maintain diabetes management and control Inhalers as necessary for shortness of breath dyspnea Weight loss exercise portion control Conservative medical therapy at this stage from a cardiac standpoint Increase activity including ambulation and possible physical therapy   Yolonda Kida, MD 01/25/2020 11:04 AM

## 2020-01-25 NOTE — Consult Note (Signed)
[place] for Warfarin/Heparin Indication: Mechanical valve  Allergies  Allergen Reactions  . Buspirone Other (See Comments)    Weakness  . Diltiazem Hcl Palpitations  . Gabapentin Palpitations  . Hydralazine Rash  . Lisinopril Cough  . Lovastatin Palpitations  . Metformin Diarrhea  . Pravastatin Other (See Comments)    Insomnia  . Sitagliptin Other (See Comments)    Constipation  . Tramadol Itching   Patient Measurements: Height: 5\' 3"  (160 cm) Weight: 107.6 kg (237 lb 4.8 oz) IBW/kg (Calculated) : 52.4  Vital Signs: Temp: 98.4 F (36.9 C) (12/07 0420) Temp Source: Oral (12/07 0420) BP: 99/69 (12/07 0420) Pulse Rate: 98 (12/07 0420)  Labs: Recent Labs    01/23/20 1142 01/23/20 1143 01/23/20 1143 01/23/20 1526 01/24/20 0626 01/24/20 2255 01/25/20 0704  HGB  --  11.3*   < >  --  10.7*  --  10.9*  HCT  --  37.6  --   --  37.2  --  36.8  PLT  --  193  --   --  197  --  182  LABPROT 13.9  --   --   --  13.9  --  13.8  INR 1.1  --   --   --  1.1  --  1.1  HEPARINUNFRC  --   --   --   --   --  0.14* 0.22*  CREATININE  --  6.26*  --   --  4.90*  --   --   TROPONINIHS  --  78*  --  82*  --   --   --    < > = values in this interval not displayed.   Estimated Creatinine Clearance: 12.7 mL/min (A) (by C-G formula based on SCr of 4.9 mg/dL (H)).  Medical History: Past Medical History:  Diagnosis Date  . Anemia   . Anxiety   . Aortic valve stenosis   . Arrhythmia    atrial fibrillation  . Arthritis    feet, legs  . B12 deficiency   . Bowel obstruction ([place])   . CHF (congestive heart failure) ([place])   . CKD (chronic kidney disease)    protein in urine  . Colostomy in place [facility])   . Diabetes mellitus without complication ([place])   . Dialysis patient [facility])    T/Th/Sa  . Diverticulitis large intestine   . Dysrhythmia   . Ectopic atrial tachycardia ([place])   . FSGS (focal segmental glomerulosclerosis)   . Gastritis   . GI  bleed   . Heart murmur   . Hyperlipidemia   . Hypertension   . Hypothyroidism   . Hypothyroidism    unspecified  . MRSA (methicillin resistant Staphylococcus aureus)    at abdominal wound.  Jan 2017.  Treated.   . Neuropathy   . Neuropathy in diabetes ([place])   . Obesity   . Pedal edema   . Psoriasis   . Shortness of breath dyspnea   . Sleep apnea    CPAP  . Vertigo    Medications:  Warfarin 1.5mg  daily; last dose was 12/4 - pharmacy tech confirmed dose with patient's daughter  Assessment: 69yo female with a past medical history of anemia, anxiety, diabetes, CHF, CKD ESRD (hemodialysis MWF), hypertension, obesity, presents to the emergency department for shortness of breath. Patient was on Eliquis in the past and was switched to warfarin around October 2021. Pharmacy has been consulted for warfarin dosing and monitoring for afib  and s/p TAVR (2019). Patient [facility] at the [facility]. Hgb 11.3 slightly decreased but appears to be patient's baseline. Spoke with patient's daughter - she stated patient was previously on warfarin 2mg  and was changed to 1mg  around 12/19/19 due to having darker stools, then patient was recently changed to warfarin 1.5mg  12/2. CHADSVASc of 5. Plan to bridge warfarin with heparin gtt.   DDI: amiodarone and levothyroxine (also PTA meds)   12/6 2255 HL 0.14  increase Heparin to 1200 units/hr  12/7 0704 HL 0.22 1000 unit bolus and increase heparin infusion to 1350 units/hr.   Date INR Dose 12/5 1.1 Never given 12/6  1.1 3 mg  12/7 1.1 3 mg   Goal of Therapy:  INR 2-3 for warfarin  Heparin level 0.3 - 0.7.  Monitor platelets by anticoagulation protocol: Yes   Plan:  Heparin:   Heparin level is still subtherapeutic. Will give a 1000 unit bolus and increase heparin infusion to 1350 units/hr. Recheck heparin level in 8 hours. CBC daily while on heparin.   Warfarin:  INR subtherapeutic. Will give warfarin 3mg  tonight x 1. Daily INR ordered.     Oswald Hillock, PharmD, BCPS 01/25/2020 7:28 AM

## 2020-01-25 NOTE — Interval H&P Note (Signed)
History and Physical Interval Note:  01/25/2020 12:26 PM  [name]  has presented today for surgery, with the diagnosis of complication of dialysis access.  The various methods of treatment have been discussed with the patient and family. After consideration of risks, benefits and other options for treatment, the patient has consented to  Procedure(s): A/V SHUNT INTERVENTION (N/A) as a surgical intervention.  [name] history has been reviewed, patient examined, no change in status, stable for surgery.  I have reviewed the patient's chart and labs.  Questions were answered to the patient's satisfaction.     Hortencia Pilar

## 2020-01-25 NOTE — Consult Note (Signed)
[facility] Vascular Consult Note  MRN : [id number]  [name] is a 69 y.o. (Jun 24, 1950) female who presents with chief complaint of  Chief Complaint  Patient presents with  . Shortness of Breath  .  History of Present Illness:  I am asked to evaluate the patient by Dr. Holley Raring.  Patient is a 69 year old woman on hemodialysis who presented to the hospital with increasing shortness of breath.  Yesterday while attempting dialysis they had significant difficulty.  They stopped her dialysis run early secondary to pulling clots from the venous needle.  The patient notes this was the first time that she had missed a full session.  She denies prolonged bleeding.  She denies pain at the fistula site shape.  She denies fever chills during dialysis.  Current Facility-Administered Medications  Medication Dose Route Frequency Provider Last Rate Last Admin  . [MAR Hold] 0.9 %  sodium chloride infusion  100 mL Intravenous PRN Lateef, Munsoor, MD      . Doug Sou Hold] 0.9 %  sodium chloride infusion  100 mL Intravenous PRN Lateef, Munsoor, MD      . Doug Sou Hold] alteplase (CATHFLO ACTIVASE) injection 2 mg  2 mg Intracatheter Once PRN Holley Raring, Munsoor, MD      . Doug Sou Hold] amiodarone (PACERONE) tablet 200 mg  200 mg Oral Daily Cox, Amy N, DO   200 mg at 01/25/20 0946  . [MAR Hold] atorvastatin (LIPITOR) tablet 40 mg  40 mg Oral Daily Cox, Amy N, DO   40 mg at 01/24/20 1013  . [START ON 01/26/2020] ceFAZolin (ANCEF) IVPB 2g/100 mL premix  2 g Intravenous On Call to OR Ermelinda Eckert, Dolores Lory, MD      . Doug Sou Hold] Chlorhexidine Gluconate Cloth 2 % PADS 6 each  6 each Topical Q0600 Cox, Amy N, DO      . [MAR Hold] DULoxetine (CYMBALTA) DR capsule 30 mg  30 mg Oral Daily Cox, Amy N, DO   30 mg at 01/24/20 1013  . heparin ADULT infusion 100 units/mL (25000 units/222mL sodium chloride 0.45%)  1,350 Units/hr Intravenous Continuous Oswald Hillock, RPH 13.5 mL/hr at 01/25/20 0946 1,350 Units/hr at  01/25/20 0946  . [MAR Hold] heparin injection 1,000 Units  1,000 Units Dialysis PRN Anthonette Legato, MD      . Doug Sou Hold] insulin glargine (LANTUS) injection 45 Units  45 Units Subcutaneous QHS Rito Ehrlich A, RPH   45 Units at 01/24/20 2233  . [MAR Hold] levothyroxine (SYNTHROID) tablet 88 mcg  88 mcg Oral QAC breakfast Cox, Amy N, DO   88 mcg at 01/24/20 0745  . [MAR Hold] lidocaine (PF) (XYLOCAINE) 1 % injection 5 mL  5 mL Intradermal PRN Lateef, Munsoor, MD      . Doug Sou Hold] lidocaine-prilocaine (EMLA) cream 1 application  1 application Topical PRN Lateef, Munsoor, MD      . Doug Sou Hold] midodrine (PROAMATINE) tablet 10 mg  10 mg Oral Q M,W,F-HD Lateef, Munsoor, MD      . Doug Sou Hold] ondansetron (ZOFRAN) tablet 4 mg  4 mg Oral Q6H PRN Cox, Amy N, DO       Or  . [MAR Hold] ondansetron (ZOFRAN) injection 4 mg  4 mg Intravenous Q6H PRN Cox, Amy N, DO      . [MAR Hold] oxyCODONE-acetaminophen (PERCOCET/ROXICET) 5-325 MG per tablet 1 tablet  1 tablet Oral Q8H PRN Cox, Amy N, DO   1 tablet at 01/24/20 1939   And  . [  MAR Hold] oxyCODONE (Oxy IR/ROXICODONE) immediate release tablet 5 mg  5 mg Oral Q8H PRN Cox, Amy N, DO   5 mg at 01/24/20 1939  . [MAR Hold] pentafluoroprop-tetrafluoroeth (GEBAUERS) aerosol 1 application  1 application Topical PRN Lateef, Munsoor, MD      . Doug Sou Hold] polyethylene glycol (MIRALAX / GLYCOLAX) packet 17 g  17 g Oral Daily PRN Cox, Amy N, DO      . [MAR Hold] Vitamin D (Ergocalciferol) (DRISDOL) capsule 50,000 Units  50,000 Units Oral Q Tue Cox, Amy N, DO      . [MAR Hold] warfarin (COUMADIN) tablet 3 mg  3 mg Oral ONCE-1600 Oswald Hillock, RPH      . [MAR Hold] Warfarin - Pharmacist Dosing Inpatient   Does not apply [id number] Sherilyn Banker, [facility]   Given at 01/24/20 1827    Past Medical History:  Diagnosis Date  . Anemia   . Anxiety   . Aortic valve stenosis   . Arrhythmia    atrial fibrillation  . Arthritis    feet, legs  . B12 deficiency   . Bowel obstruction  ([place])   . CHF (congestive heart failure) ([place])   . CKD (chronic kidney disease)    protein in urine  . Colostomy in place [facility])   . Diabetes mellitus without complication ([place])   . Dialysis patient [facility])    T/Th/Sa  . Diverticulitis large intestine   . Dysrhythmia   . Ectopic atrial tachycardia ([place])   . FSGS (focal segmental glomerulosclerosis)   . Gastritis   . GI bleed   . Heart murmur   . Hyperlipidemia   . Hypertension   . Hypothyroidism   . Hypothyroidism    unspecified  . MRSA (methicillin resistant Staphylococcus aureus)    at abdominal wound.  Jan 2017.  Treated.   . Neuropathy   . Neuropathy in diabetes ([place])   . Obesity   . Pedal edema   . Psoriasis   . Shortness of breath dyspnea   . Sleep apnea    CPAP  . Vertigo     Past Surgical History:  Procedure Laterality Date  . ABDOMINAL HYSTERECTOMY    . APPLICATION OF WOUND VAC N/A 02/24/2015   Procedure: APPLICATION OF WOUND VAC;  Surgeon: Clayburn Pert, MD;  Location: [facility];  Service: General;  Laterality: N/A;  . APPLICATION OF WOUND VAC N/A 02/26/2015   Procedure: APPLICATION OF WOUND VAC;  Surgeon: Clayburn Pert, MD;  Location: [facility];  Service: General;  Laterality: N/A;  . AV FISTULA PLACEMENT Left 01/29/2019   Procedure: ARTERIOVENOUS (AV) FISTULA CREATION ( BRACHIAL CEPHALIC);  Surgeon: Katha Cabal, MD;  Location: [facility];  Service: Vascular;  Laterality: Left;  . BACK SURGERY     spur frmoved from lower back  . CARDIAC CATHETERIZATION    . CARDIAC VALVE REPLACEMENT  08/2017   [facility]  . CARDIOVERSION N/A 07/15/2018   Procedure: CARDIOVERSION;  Surgeon: Corey Skains, MD;  Location: [facility];  Service: Cardiovascular;  Laterality: N/A;  . CATARACT EXTRACTION W/PHACO Right 11/16/2018   Procedure: CATARACT EXTRACTION PHACO AND INTRAOCULAR LENS PLACEMENT (IOC) RIGHT DIABETiC  01:18.3  13.5%  10.82;  Surgeon: Eulogio Bear, MD;  Location: [place];  Service: Ophthalmology;   Laterality: Right;  Diabetic - insulin sleep apnea  . CATARACT EXTRACTION W/PHACO Left 12/07/2018   Procedure: CATARACT EXTRACTION PHACO AND INTRAOCULAR LENS PLACEMENT (IOC) LEFT DIABETIC 01:02.6  8.8%  5.77;  Surgeon:  Eulogio Bear, MD;  Location: [place];  Service: Ophthalmology;  Laterality: Left;  Diabetic - insulin  . CHOLECYSTECTOMY    . COLECTOMY    . COLONOSCOPY WITH PROPOFOL N/A 09/04/2016   Procedure: COLONOSCOPY WITH PROPOFOL;  Surgeon: Manya Silvas, MD;  Location: [facility] ENDOSCOPY;  Service: Endoscopy;  Laterality: N/A;  . COLOSTOMY REVERSAL N/A 07/12/2015   Procedure: COLOSTOMY REVERSAL;  Surgeon: Clayburn Pert, MD;  Location: [facility];  Service: General;  Laterality: N/A;  . DEBRIDEMENT OF ABDOMINAL WALL ABSCESS N/A 02/22/2015   Procedure: DEBRIDEMENT OF ABDOMINAL WALL ABSCESS;  Surgeon: Clayburn Pert, MD;  Location: [facility];  Service: General;  Laterality: N/A;  . DIALYSIS/PERMA CATHETER INSERTION N/A 10/20/2018   Procedure: DIALYSIS/PERMA CATHETER INSERTION;  Surgeon: Katha Cabal, MD;  Location: [place] CV LAB;  Service: Cardiovascular;  Laterality: N/A;  . DIALYSIS/PERMA CATHETER REMOVAL N/A 08/03/2019   Procedure: DIALYSIS/PERMA CATHETER REMOVAL;  Surgeon: Katha Cabal, MD;  Location: [place] [facility];  Service: Cardiovascular;  Laterality: N/A;  . EXCISION MASS ABDOMINAL N/A 02/24/2015   Procedure: EXCISION MASS ABDOMINAL  / [place];  Surgeon: Clayburn Pert, MD;  Location: [facility];  Service: General;  Laterality: N/A;  . EXCISION MASS ABDOMINAL N/A 02/26/2015   Procedure: EXCISION MASS ABDOMINAL/wash out;  Surgeon: Clayburn Pert, MD;  Location: [facility];  Service: General;  Laterality: N/A;  . FLEXIBLE SIGMOIDOSCOPY  06/19/2015   Procedure: FLEXIBLE SIGMOIDOSCOPY;  Surgeon: Lucilla Lame, MD;  Location: [place];  Service: Endoscopy;;  UNABLE TO ACCESS OSTOMY SITE FOR ACCESS INTO COLON  . HERNIA REPAIR     umbilical  .  LAPAROSCOPY  07/12/2015   Procedure: LAPAROSCOPY DIAGNOSTIC;  Surgeon: Clayburn Pert, MD;  Location: [facility];  Service: General;;  . LAPAROTOMY  07/12/2015   Procedure: EXPLORATORY LAPAROTOMY;  Surgeon: Clayburn Pert, MD;  Location: [facility];  Service: General;;  . LAPAROTOMY N/A 02/06/2015   Procedure: Laparotomy, reduction of incarcerated parastomal hernia, repair of parastomal hernia with mesh;  Surgeon: Sherri Rad, MD;  Location: [facility];  Service: General;  Laterality: N/A;  . laparotomy closure of cecal perforation  05/09/2013   Dr. Marina Gravel  . LYSIS OF ADHESION  07/12/2015   Procedure: LYSIS OF ADHESION;  Surgeon: Clayburn Pert, MD;  Location: [facility];  Service: General;;  . TONSILLECTOMY    . WOUND DEBRIDEMENT N/A 03/03/2015   Procedure: DEBRIDEMENT ABDOMINAL WOUND;  Surgeon: Florene Glen, MD;  Location: [facility];  Service: General;  Laterality: N/A;  . WOUND DEBRIDEMENT N/A 03/09/2015   Procedure: DEBRIDEMENT ABDOMINAL WOUND;  Surgeon: Florene Glen, MD;  Location: [facility];  Service: General;  Laterality: N/A;    Social History Social History   Tobacco Use  . Smoking status: Former Smoker    Types: Cigarettes    Quit date: 04/18/2013    Years since quitting: 6.7  . Smokeless tobacco: Never Used  Vaping Use  . Vaping Use: Never used  Substance Use Topics  . Alcohol use: No    Alcohol/week: 0.0 standard drinks  . Drug use: No    Family History Family History  Problem Relation Age of Onset  . Diabetes Mother   . Hypertension Father   . CAD Father   . Heart attack Father   . Colon cancer Brother   No family history of bleeding/clotting disorders, porphyria or autoimmune disease   Allergies  Allergen Reactions  . Buspirone Other (See Comments)    Weakness  .  Diltiazem Hcl Palpitations  . Gabapentin Palpitations  . Hydralazine Rash  . Lisinopril Cough  . Lovastatin Palpitations  . Metformin Diarrhea  . Pravastatin Other (See Comments)    Insomnia  .  Sitagliptin Other (See Comments)    Constipation  . Tramadol Itching     REVIEW OF SYSTEMS (Negative unless checked)  Constitutional: [] Weight loss  [] Fever  [] Chills Cardiac: [] Chest pain   [] Chest pressure   [] Palpitations   [x] Shortness of breath at rest   [x] Shortness of breath with exertion. Vascular:  [] Pain in legs with walking   [] Pain in legs at rest  [] History of DVT   [] Phlebitis   [x] Swelling in legs   [] Varicose veins   [] Non-healing ulcers Pulmonary:   [] Uses home oxygen   [] Productive cough   [] Hemoptysis   [] Wheeze  [] COPD   [] Asthma Neurologic:  [] Dizziness  [] Blackouts   [] Seizures   [] History of stroke   [] History of TIA  [] Aphasia   [] Temporary blindness   [] Dysphagia   [] Weakness or numbness in arms   [] Weakness or numbness in legs Musculoskeletal:  [] Arthritis   [] Joint swelling   [] Joint pain   [] Low back pain Hematologic:  [] Easy bruising    [] Hypercoagulable state   [] Anemic  [] Hepatitis Gastrointestinal:  [] Blood in stool   [] Vomiting blood  [] Gastroesophageal reflux/heartburn   [] Difficulty swallowing. Genitourinary:  [x] Chronic kidney disease   [] Difficult urination  [] Frequent urination  [] Burning with urination   [] Blood in urine Skin:  [] Rashes   [] Ulcers   Psychological:  [] History of anxiety   []  History of major depression.    Physical Examination  Vitals:   01/25/20 0016 01/25/20 0420 01/25/20 0752 01/25/20 1144  BP: 111/74 99/69 103/72 135/83  Pulse: 88 98 97 (!) 107  Resp:  18 17 20   Temp: 97.6 F (36.4 C) 98.4 F (36.9 C) 97.9 F (36.6 C) 97.9 F (36.6 C)  TempSrc: Oral Oral Oral   SpO2: 100% 100% 94% 95%  Weight:  107.6 kg    Height:       Body mass index is 42.04 kg/m.  Head: [place]/AT, No temporalis wasting.  Ear/Nose/Throat: Nares w/o erythema or drainage, oropharynx mucus membranes moist Eyes: PERRLA, Sclera nonicteric.  Neck: Supple,   No JVD.  Pulmonary:  Breath sounds equal bilaterally, no use of accessory muscles.  Cardiac:  RRR, normal S1, S2,  Vascular: 2+ radial pulses bilaterally; left brachiocephalic fistula with a good thrill good bruit moderately pulsatile no erosions of the overlying skin noted Gastrointestinal: soft, non-tender, non-distended.  Musculoskeletal: Moves all extremities.  No deformity or atrophy. No edema. Neurologic: 5/5 motor sensory, face appears symmetric speech fluent  Psychiatric: Appropriate mood for situation. Dermatologic: No rashes or ulcers noted.  No cellulitis or open wounds. Lymph : No Cervical,  or Inguinal lymphadenopathy.      CBC Lab Results  Component Value Date   WBC 10.6 (H) 01/25/2020   HGB 10.9 (L) 01/25/2020   HCT 36.8 01/25/2020   MCV 100.5 (H) 01/25/2020   PLT 182 01/25/2020    BMET    Component Value Date/Time   NA 136 01/24/2020 0626   NA 143 07/19/2013 0523   K 5.6 (H) 01/24/2020 0626   K 3.7 07/19/2013 0523   CL 97 (L) 01/24/2020 0626   CL 109 (H) 07/19/2013 0523   CO2 28 01/24/2020 0626   CO2 29 07/19/2013 0523   GLUCOSE 170 (H) 01/24/2020 0626   GLUCOSE 110 (H) 07/19/2013 0523   BUN  28 (H) 01/24/2020 0626   BUN 8 07/19/2013 0523   CREATININE 4.90 (H) 01/24/2020 0626   CREATININE 1.58 (H) 07/19/2013 0523   CALCIUM 9.2 01/24/2020 0626   CALCIUM 7.8 (L) 07/19/2013 0523   GFRNONAA 9 (L) 01/24/2020 0626   GFRNONAA 34 (L) 07/19/2013 0523   GFRAA 12 (L) 09/10/2019 0517   GFRAA 40 (L) 07/19/2013 0523   Estimated Creatinine Clearance: 12.7 mL/min (A) (by C-G formula based on SCr of 4.9 mg/dL (H)).  COAG Lab Results  Component Value Date   INR 1.1 01/25/2020   INR 1.1 01/24/2020   INR 1.1 01/23/2020      Assessment/Plan 1.  Complication of AV dialysis access: Recommend:  The patient is experiencing increasing problems with their dialysis access.  Patient should have a fistulagram with the intention for intervention.  The intention for intervention is to restore appropriate flow and prevent thrombosis and possible loss of the  access.  As well as improve the quality of dialysis therapy.  The risks, benefits and alternative therapies were reviewed in detail with the patient.  All questions were answered.  The patient agrees to proceed with angio/intervention.  2.  End-stage renal disease on hemodialysis: At the present time the patient does not have adequate dialysis access. Arrangements will be made to correct this as noted above.  Continue hemodialysis as ordered without interruption.  Avoid nephrotoxic medications and dehydration.  Further plans per nephrology.  3.  Hypertension: Continue antihypertensive medications as already ordered, these medications have been reviewed and there are no changes at this time.  4.  Diabetes: Continue hypoglycemic medications as already ordered, these medications have been reviewed and there are no changes at this time.  Hgb A1C to be monitored as already arranged by primary service      Hortencia Pilar, MD  01/25/2020 12:19 PM

## 2020-01-25 NOTE — Progress Notes (Signed)
Received report from nurse vascular team no complication from the procedure and patient was transported to dialysis unit for H/D treatment today

## 2020-01-25 NOTE — Op Note (Signed)
OPERATIVE NOTE   PROCEDURE: 1. Contrast injection left arm brachiocephalic AV access 2. Percutaneous transluminal angioplasty peripheral segment left arm brachiocephalic fistula  PRE-OPERATIVE DIAGNOSIS: Complication of dialysis access                                                       End Stage Renal Disease  POST-OPERATIVE DIAGNOSIS: same as above   SURGEON: Katha Cabal, M.D.  ANESTHESIA: Conscious sedation was administered under my direct supervision by the interventional radiology RN. IV Versed plus fentanyl were utilized. Continuous ECG, pulse oximetry and blood pressure was monitored throughout the entire procedure.  Conscious sedation was for a total of 45 minutes.  ESTIMATED BLOOD LOSS: minimal  FINDING(S): Stricture of the AV graft  SPECIMEN(S):  None  CONTRAST: 35 cc  FLUOROSCOPY TIME: 4.3 minutes  INDICATIONS: [name] is a 69 y.o. female who  presents with malfunctioning left arm brachiocephalic AV access.  The patient is scheduled for angiography with possible intervention of the AV access.  The patient is aware the risks include but are not limited to: bleeding, infection, thrombosis of the cannulated access, and possible anaphylactic reaction to the contrast.  The patient acknowledges if the access can not be salvaged a tunneled catheter will be needed and will be placed during this procedure.  The patient is aware of the risks of the procedure and elects to proceed with the angiogram and intervention.  DESCRIPTION: After full informed written consent was obtained, the patient was brought back to the Special Procedure suite and placed supine position.  Appropriate cardiopulmonary monitors were placed.  The left arm was prepped and draped in the standard fashion.  Appropriate timeout is called. The left brachiocephalic fistula was cannulated with a micropuncture needle.  Cannulation was performed with ultrasound guidance. Ultrasound was placed in a sterile  sleeve, the AV access was interrogated and noted to be echolucent and compressible indicating patency. Image was recorded for the permanent record. The puncture is performed under continuous ultrasound visualization.   The microwire was advanced and the needle was exchanged for  a microsheath.  The J-wire was then advanced and a 6 Fr sheath inserted.  Hand injections were completed to image the access from the arterial anastomosis through the entire access.  The central venous structures were also imaged by hand injections.  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  The cephalic subclavian confluence is widely patent.  Subclavian innominate and superior vena cava is widely patent.  With the balloon inflated reflux imaging demonstrates the arterial anastomosis widely patent.  Based on the images,  3000 units of heparin was given and a wire was negotiated through the strictures within the venous portion of the graft.  An 8 mm then a 10 mm and lastly a 12 mm balloon was used.  Inflations were to 8 10 atms for one minute each.  Follow-up imaging demonstrates less than 20% residual stenosis with rapid flow of contrast through the graft, the central venous anatomy is preserved.  A 4-0 Monocryl purse-string suture was sewn around the sheath.  The sheath was removed and light pressure was applied.  A sterile bandage was applied to the puncture site.  COMPLICATIONS: None  CONDITION: [name], M.D [facility] Office: [phone]  01/25/2020 2:29 PM

## 2020-01-25 NOTE — Consult Note (Signed)
[place] for Warfarin/Heparin Indication: Mechanical valve  Allergies  Allergen Reactions  . Buspirone Other (See Comments)    Weakness  . Diltiazem Hcl Palpitations  . Gabapentin Palpitations  . Hydralazine Rash  . Lisinopril Cough  . Lovastatin Palpitations  . Metformin Diarrhea  . Pravastatin Other (See Comments)    Insomnia  . Sitagliptin Other (See Comments)    Constipation  . Tramadol Itching   Patient Measurements: Height: 5\' 3"  (160 cm) Weight: 107.5 kg (236 lb 14.4 oz) IBW/kg (Calculated) : 52.4  Vital Signs: Temp: 97.6 F (36.4 C) (12/07 0016) Temp Source: Oral (12/07 0016) BP: 111/74 (12/07 0016) Pulse Rate: 88 (12/07 0016)  Labs: Recent Labs    01/23/20 1142 01/23/20 1143 01/23/20 1526 01/24/20 0626 01/24/20 2255  HGB  --  11.3*  --  10.7*  --   HCT  --  37.6  --  37.2  --   PLT  --  193  --  197  --   LABPROT 13.9  --   --  13.9  --   INR 1.1  --   --  1.1  --   HEPARINUNFRC  --   --   --   --  0.14*  CREATININE  --  6.26*  --  4.90*  --   TROPONINIHS  --  78* 82*  --   --    Estimated Creatinine Clearance: 12.7 mL/min (A) (by C-G formula based on SCr of 4.9 mg/dL (H)).  Medical History: Past Medical History:  Diagnosis Date  . Anemia   . Anxiety   . Aortic valve stenosis   . Arrhythmia    atrial fibrillation  . Arthritis    feet, legs  . B12 deficiency   . Bowel obstruction ([place])   . CHF (congestive heart failure) ([place])   . CKD (chronic kidney disease)    protein in urine  . Colostomy in place [facility])   . Diabetes mellitus without complication ([place])   . Dialysis patient [facility])    T/Th/Sa  . Diverticulitis large intestine   . Dysrhythmia   . Ectopic atrial tachycardia ([place])   . FSGS (focal segmental glomerulosclerosis)   . Gastritis   . GI bleed   . Heart murmur   . Hyperlipidemia   . Hypertension   . Hypothyroidism   . Hypothyroidism    unspecified  . MRSA (methicillin resistant  Staphylococcus aureus)    at abdominal wound.  Jan 2017.  Treated.   . Neuropathy   . Neuropathy in diabetes ([place])   . Obesity   . Pedal edema   . Psoriasis   . Shortness of breath dyspnea   . Sleep apnea    CPAP  . Vertigo    Medications:  Warfarin 1.5mg  daily; last dose was 12/4 - pharmacy tech confirmed dose with patient's daughter  Assessment: 69yo female with a past medical history of anemia, anxiety, diabetes, CHF, CKD ESRD (hemodialysis MWF), hypertension, obesity, presents to the emergency department for shortness of breath. Patient was on Eliquis in the past and was switched to warfarin around October 2021. Pharmacy has been consulted for warfarin dosing and monitoring for afib and s/p TAVR (2019). Patient follows at the [facility]. Hgb 11.3 slightly decreased but appears to be patient's baseline. Spoke with patient's daughter - she stated patient was previously on warfarin 2mg  and was changed to 1mg  around 12/19/19 due to having darker stools, then patient was recently changed to warfarin  1.5mg  12/2. CHADSVASc of 5. Plan to bridge warfarin with heparin gtt.   DDI: amiodarone and levothyroxine (also PTA meds)   Date INR Dose 12/5 1.1 Never given 12/5  1.1 3 mg    Goal of Therapy:  INR 2-3 for warfarin  Heparin level 0.3 - 0.7.  Monitor platelets by anticoagulation protocol: Yes   Plan:  Heparin:  Will start heparin 1000 units/hr. Will order heparin level in 8 hours. CBC daily while on heparin.   12/6 @ 2255 HL 0.14, subtherapeutic.  Will increase Heparin to 1200 units/hr and recheck HL ~ 6 hours after rate increase  Warfarin: INR subtherapeutic. Will give warfarin 3mg  tonight x 1. Daily INR ordered.    Ena Dawley, PharmD 01/25/2020 12:23 AM

## 2020-01-25 NOTE — H&P (View-Only) (Signed)
[facility] Vascular Consult Note  MRN : [id number]  [name] is a 69 y.o. (21-Oct-1950) female who presents with chief complaint of  Chief Complaint  Patient presents with  . Shortness of Breath  .  History of Present Illness:  I am asked to evaluate the patient by Dr. Holley Raring.  Patient is a 69 year old woman on hemodialysis who presented to the hospital with increasing shortness of breath.  Yesterday while attempting dialysis they had significant difficulty.  They stopped her dialysis run early secondary to pulling clots from the venous needle.  The patient notes this was the first time that she had missed a full session.  She denies prolonged bleeding.  She denies pain at the fistula site shape.  She denies fever chills during dialysis.  Current Facility-Administered Medications  Medication Dose Route Frequency Provider Last Rate Last Admin  . [MAR Hold] 0.9 %  sodium chloride infusion  100 mL Intravenous PRN Lateef, Munsoor, MD      . Doug Sou Hold] 0.9 %  sodium chloride infusion  100 mL Intravenous PRN Lateef, Munsoor, MD      . Doug Sou Hold] alteplase (CATHFLO ACTIVASE) injection 2 mg  2 mg Intracatheter Once PRN Holley Raring, Munsoor, MD      . Doug Sou Hold] amiodarone (PACERONE) tablet 200 mg  200 mg Oral Daily Cox, Amy N, DO   200 mg at 01/25/20 0946  . [MAR Hold] atorvastatin (LIPITOR) tablet 40 mg  40 mg Oral Daily Cox, Amy N, DO   40 mg at 01/24/20 1013  . [START ON 01/26/2020] ceFAZolin (ANCEF) IVPB 2g/100 mL premix  2 g Intravenous On Call to OR Karlea Mckibbin, Dolores Lory, MD      . Doug Sou Hold] Chlorhexidine Gluconate Cloth 2 % PADS 6 each  6 each Topical Q0600 Cox, Amy N, DO      . [MAR Hold] DULoxetine (CYMBALTA) DR capsule 30 mg  30 mg Oral Daily Cox, Amy N, DO   30 mg at 01/24/20 1013  . heparin ADULT infusion 100 units/mL (25000 units/234mL sodium chloride 0.45%)  1,350 Units/hr Intravenous Continuous Oswald Hillock, RPH 13.5 mL/hr at 01/25/20 0946 1,350 Units/hr at  01/25/20 0946  . [MAR Hold] heparin injection 1,000 Units  1,000 Units Dialysis PRN Anthonette Legato, MD      . Doug Sou Hold] insulin glargine (LANTUS) injection 45 Units  45 Units Subcutaneous QHS Rito Ehrlich A, RPH   45 Units at 01/24/20 2233  . [MAR Hold] levothyroxine (SYNTHROID) tablet 88 mcg  88 mcg Oral QAC breakfast Cox, Amy N, DO   88 mcg at 01/24/20 0745  . [MAR Hold] lidocaine (PF) (XYLOCAINE) 1 % injection 5 mL  5 mL Intradermal PRN Lateef, Munsoor, MD      . Doug Sou Hold] lidocaine-prilocaine (EMLA) cream 1 application  1 application Topical PRN Lateef, Munsoor, MD      . Doug Sou Hold] midodrine (PROAMATINE) tablet 10 mg  10 mg Oral Q M,W,F-HD Lateef, Munsoor, MD      . Doug Sou Hold] ondansetron (ZOFRAN) tablet 4 mg  4 mg Oral Q6H PRN Cox, Amy N, DO       Or  . [MAR Hold] ondansetron (ZOFRAN) injection 4 mg  4 mg Intravenous Q6H PRN Cox, Amy N, DO      . [MAR Hold] oxyCODONE-acetaminophen (PERCOCET/ROXICET) 5-325 MG per tablet 1 tablet  1 tablet Oral Q8H PRN Cox, Amy N, DO   1 tablet at 01/24/20 1939   And  . [  MAR Hold] oxyCODONE (Oxy IR/ROXICODONE) immediate release tablet 5 mg  5 mg Oral Q8H PRN Cox, Amy N, DO   5 mg at 01/24/20 1939  . [MAR Hold] pentafluoroprop-tetrafluoroeth (GEBAUERS) aerosol 1 application  1 application Topical PRN Lateef, Munsoor, MD      . Doug Sou Hold] polyethylene glycol (MIRALAX / GLYCOLAX) packet 17 g  17 g Oral Daily PRN Cox, Amy N, DO      . [MAR Hold] Vitamin D (Ergocalciferol) (DRISDOL) capsule 50,000 Units  50,000 Units Oral Q Tue Cox, Amy N, DO      . [MAR Hold] warfarin (COUMADIN) tablet 3 mg  3 mg Oral ONCE-1600 Oswald Hillock, RPH      . [MAR Hold] Warfarin - Pharmacist Dosing Inpatient   Does not apply [id number] Sherilyn Banker, [facility]   Given at 01/24/20 1827    Past Medical History:  Diagnosis Date  . Anemia   . Anxiety   . Aortic valve stenosis   . Arrhythmia    atrial fibrillation  . Arthritis    feet, legs  . B12 deficiency   . Bowel obstruction  ([place])   . CHF (congestive heart failure) ([place])   . CKD (chronic kidney disease)    protein in urine  . Colostomy in place [facility])   . Diabetes mellitus without complication ([place])   . Dialysis patient [facility])    T/Th/Sa  . Diverticulitis large intestine   . Dysrhythmia   . Ectopic atrial tachycardia ([place])   . FSGS (focal segmental glomerulosclerosis)   . Gastritis   . GI bleed   . Heart murmur   . Hyperlipidemia   . Hypertension   . Hypothyroidism   . Hypothyroidism    unspecified  . MRSA (methicillin resistant Staphylococcus aureus)    at abdominal wound.  Jan 2017.  Treated.   . Neuropathy   . Neuropathy in diabetes ([place])   . Obesity   . Pedal edema   . Psoriasis   . Shortness of breath dyspnea   . Sleep apnea    CPAP  . Vertigo     Past Surgical History:  Procedure Laterality Date  . ABDOMINAL HYSTERECTOMY    . APPLICATION OF WOUND VAC N/A 02/24/2015   Procedure: APPLICATION OF WOUND VAC;  Surgeon: Clayburn Pert, MD;  Location: [facility] ORS;  Service: General;  Laterality: N/A;  . APPLICATION OF WOUND VAC N/A 02/26/2015   Procedure: APPLICATION OF WOUND VAC;  Surgeon: Clayburn Pert, MD;  Location: [facility];  Service: General;  Laterality: N/A;  . AV FISTULA PLACEMENT Left 01/29/2019   Procedure: ARTERIOVENOUS (AV) FISTULA CREATION ( BRACHIAL CEPHALIC);  Surgeon: Katha Cabal, MD;  Location: [facility] ORS;  Service: Vascular;  Laterality: Left;  . BACK SURGERY     spur frmoved from lower back  . CARDIAC CATHETERIZATION    . CARDIAC VALVE REPLACEMENT  08/2017   [facility]  . CARDIOVERSION N/A 07/15/2018   Procedure: CARDIOVERSION;  Surgeon: Corey Skains, MD;  Location: [facility] OR[facility];  Service: Cardiovascular;  Laterality: N/A;  . CATARACT EXTRACTION W/PHACO Right 11/16/2018   Procedure: CATARACT EXTRACTION PHACO AND INTRAOCULAR LENS PLACEMENT (IOC) RIGHT DIABETiC  01:18.3  13.5%  10.82;  Surgeon: Eulogio Bear, MD;  Location: [place];  Service: Ophthalmology;   Laterality: Right;  Diabetic - insulin sleep apnea  . CATARACT EXTRACTION W/PHACO Left 12/07/2018   Procedure: CATARACT EXTRACTION PHACO AND INTRAOCULAR LENS PLACEMENT (IOC) LEFT DIABETIC 01:02.6  8.8%  5.77;  Surgeon:  Eulogio Bear, MD;  Location: [place];  Service: Ophthalmology;  Laterality: Left;  Diabetic - insulin  . CHOLECYSTECTOMY    . COLECTOMY    . COLONOSCOPY WITH PROPOFOL N/A 09/04/2016   Procedure: COLONOSCOPY WITH PROPOFOL;  Surgeon: Manya Silvas, MD;  Location: [facility] ENDOSCOPY;  Service: Endoscopy;  Laterality: N/A;  . COLOSTOMY REVERSAL N/A 07/12/2015   Procedure: COLOSTOMY REVERSAL;  Surgeon: Clayburn Pert, MD;  Location: [facility];  Service: General;  Laterality: N/A;  . DEBRIDEMENT OF ABDOMINAL WALL ABSCESS N/A 02/22/2015   Procedure: DEBRIDEMENT OF ABDOMINAL WALL ABSCESS;  Surgeon: Clayburn Pert, MD;  Location: [facility];  Service: General;  Laterality: N/A;  . DIALYSIS/PERMA CATHETER INSERTION N/A 10/20/2018   Procedure: DIALYSIS/PERMA CATHETER INSERTION;  Surgeon: Katha Cabal, MD;  Location: [place] CV LAB;  Service: Cardiovascular;  Laterality: N/A;  . DIALYSIS/PERMA CATHETER REMOVAL N/A 08/03/2019   Procedure: DIALYSIS/PERMA CATHETER REMOVAL;  Surgeon: Katha Cabal, MD;  Location: [place] CV LAB;  Service: Cardiovascular;  Laterality: N/A;  . EXCISION MASS ABDOMINAL N/A 02/24/2015   Procedure: EXCISION MASS ABDOMINAL  / [place];  Surgeon: Clayburn Pert, MD;  Location: [facility];  Service: General;  Laterality: N/A;  . EXCISION MASS ABDOMINAL N/A 02/26/2015   Procedure: EXCISION MASS ABDOMINAL/wash out;  Surgeon: Clayburn Pert, MD;  Location: [facility];  Service: General;  Laterality: N/A;  . FLEXIBLE SIGMOIDOSCOPY  06/19/2015   Procedure: FLEXIBLE SIGMOIDOSCOPY;  Surgeon: Lucilla Lame, MD;  Location: [place];  Service: Endoscopy;;  UNABLE TO ACCESS OSTOMY SITE FOR ACCESS INTO COLON  . HERNIA REPAIR     umbilical  .  LAPAROSCOPY  07/12/2015   Procedure: LAPAROSCOPY DIAGNOSTIC;  Surgeon: Clayburn Pert, MD;  Location: [facility];  Service: General;;  . LAPAROTOMY  07/12/2015   Procedure: EXPLORATORY LAPAROTOMY;  Surgeon: Clayburn Pert, MD;  Location: [facility];  Service: General;;  . LAPAROTOMY N/A 02/06/2015   Procedure: Laparotomy, reduction of incarcerated parastomal hernia, repair of parastomal hernia with mesh;  Surgeon: Sherri Rad, MD;  Location: [facility];  Service: General;  Laterality: N/A;  . laparotomy closure of cecal perforation  05/09/2013   Dr. Marina Gravel  . LYSIS OF ADHESION  07/12/2015   Procedure: LYSIS OF ADHESION;  Surgeon: Clayburn Pert, MD;  Location: [facility];  Service: General;;  . TONSILLECTOMY    . WOUND DEBRIDEMENT N/A 03/03/2015   Procedure: DEBRIDEMENT ABDOMINAL WOUND;  Surgeon: Florene Glen, MD;  Location: [facility];  Service: General;  Laterality: N/A;  . WOUND DEBRIDEMENT N/A 03/09/2015   Procedure: DEBRIDEMENT ABDOMINAL WOUND;  Surgeon: Florene Glen, MD;  Location: [facility];  Service: General;  Laterality: N/A;    Social History Social History   Tobacco Use  . Smoking status: Former Smoker    Types: Cigarettes    Quit date: 04/18/2013    Years since quitting: 6.7  . Smokeless tobacco: Never Used  Vaping Use  . Vaping Use: Never used  Substance Use Topics  . Alcohol use: No    Alcohol/week: 0.0 standard drinks  . Drug use: No    Family History Family History  Problem Relation Age of Onset  . Diabetes Mother   . Hypertension Father   . CAD Father   . Heart attack Father   . Colon cancer Brother   No family history of bleeding/clotting disorders, porphyria or autoimmune disease   Allergies  Allergen Reactions  . Buspirone Other (See Comments)    Weakness  .  Diltiazem Hcl Palpitations  . Gabapentin Palpitations  . Hydralazine Rash  . Lisinopril Cough  . Lovastatin Palpitations  . Metformin Diarrhea  . Pravastatin Other (See Comments)    Insomnia  .  Sitagliptin Other (See Comments)    Constipation  . Tramadol Itching     REVIEW OF SYSTEMS (Negative unless checked)  Constitutional: [] Weight loss  [] Fever  [] Chills Cardiac: [] Chest pain   [] Chest pressure   [] Palpitations   [x] Shortness of breath at rest   [x] Shortness of breath with exertion. Vascular:  [] Pain in legs with walking   [] Pain in legs at rest  [] History of DVT   [] Phlebitis   [x] Swelling in legs   [] Varicose veins   [] Non-healing ulcers Pulmonary:   [] Uses home oxygen   [] Productive cough   [] Hemoptysis   [] Wheeze  [] COPD   [] Asthma Neurologic:  [] Dizziness  [] Blackouts   [] Seizures   [] History of stroke   [] History of TIA  [] Aphasia   [] Temporary blindness   [] Dysphagia   [] Weakness or numbness in arms   [] Weakness or numbness in legs Musculoskeletal:  [] Arthritis   [] Joint swelling   [] Joint pain   [] Low back pain Hematologic:  [] Easy bruising    [] Hypercoagulable state   [] Anemic  [] Hepatitis Gastrointestinal:  [] Blood in stool   [] Vomiting blood  [] Gastroesophageal reflux/heartburn   [] Difficulty swallowing. Genitourinary:  [x] Chronic kidney disease   [] Difficult urination  [] Frequent urination  [] Burning with urination   [] Blood in urine Skin:  [] Rashes   [] Ulcers   Psychological:  [] History of anxiety   []  History of major depression.    Physical Examination  Vitals:   01/25/20 0016 01/25/20 0420 01/25/20 0752 01/25/20 1144  BP: 111/74 99/69 103/72 135/83  Pulse: 88 98 97 (!) 107  Resp:  18 17 20   Temp: 97.6 F (36.4 C) 98.4 F (36.9 C) 97.9 F (36.6 C) 97.9 F (36.6 C)  TempSrc: Oral Oral Oral   SpO2: 100% 100% 94% 95%  Weight:  107.6 kg    Height:       Body mass index is 42.04 kg/m.  Head: [place]/AT, No temporalis wasting.  Ear/Nose/Throat: Nares w/o erythema or drainage, oropharynx mucus membranes moist Eyes: PERRLA, Sclera nonicteric.  Neck: Supple,   No JVD.  Pulmonary:  Breath sounds equal bilaterally, no use of accessory muscles.  Cardiac:  RRR, normal S1, S2,  Vascular: 2+ radial pulses bilaterally; left brachiocephalic fistula with a good thrill good bruit moderately pulsatile no erosions of the overlying skin noted Gastrointestinal: soft, non-tender, non-distended.  Musculoskeletal: Moves all extremities.  No deformity or atrophy. No edema. Neurologic: 5/5 motor sensory, face appears symmetric speech fluent  Psychiatric: Appropriate mood for situation. Dermatologic: No rashes or ulcers noted.  No cellulitis or open wounds. Lymph : No Cervical,  or Inguinal lymphadenopathy.      CBC Lab Results  Component Value Date   WBC 10.6 (H) 01/25/2020   HGB 10.9 (L) 01/25/2020   HCT 36.8 01/25/2020   MCV 100.5 (H) 01/25/2020   PLT 182 01/25/2020    BMET    Component Value Date/Time   NA 136 01/24/2020 0626   NA 143 07/19/2013 0523   K 5.6 (H) 01/24/2020 0626   K 3.7 07/19/2013 0523   CL 97 (L) 01/24/2020 0626   CL 109 (H) 07/19/2013 0523   CO2 28 01/24/2020 0626   CO2 29 07/19/2013 0523   GLUCOSE 170 (H) 01/24/2020 0626   GLUCOSE 110 (H) 07/19/2013 0523   BUN  28 (H) 01/24/2020 0626   BUN 8 07/19/2013 0523   CREATININE 4.90 (H) 01/24/2020 0626   CREATININE 1.58 (H) 07/19/2013 0523   CALCIUM 9.2 01/24/2020 0626   CALCIUM 7.8 (L) 07/19/2013 0523   GFRNONAA 9 (L) 01/24/2020 0626   GFRNONAA 34 (L) 07/19/2013 0523   GFRAA 12 (L) 09/10/2019 0517   GFRAA 40 (L) 07/19/2013 0523   Estimated Creatinine Clearance: 12.7 mL/min (A) (by C-G formula based on SCr of 4.9 mg/dL (H)).  COAG Lab Results  Component Value Date   INR 1.1 01/25/2020   INR 1.1 01/24/2020   INR 1.1 01/23/2020      Assessment/Plan 1.  Complication of AV dialysis access: Recommend:  The patient is experiencing increasing problems with their dialysis access.  Patient should have a fistulagram with the intention for intervention.  The intention for intervention is to restore appropriate flow and prevent thrombosis and possible loss of the  access.  As well as improve the quality of dialysis therapy.  The risks, benefits and alternative therapies were reviewed in detail with the patient.  All questions were answered.  The patient agrees to proceed with angio/intervention.  2.  End-stage renal disease on hemodialysis: At the present time the patient does not have adequate dialysis access. Arrangements will be made to correct this as noted above.  Continue hemodialysis as ordered without interruption.  Avoid nephrotoxic medications and dehydration.  Further plans per nephrology.  3.  Hypertension: Continue antihypertensive medications as already ordered, these medications have been reviewed and there are no changes at this time.  4.  Diabetes: Continue hypoglycemic medications as already ordered, these medications have been reviewed and there are no changes at this time.  Hgb A1C to be monitored as already arranged by primary service      Hortencia Pilar, MD  01/25/2020 12:19 PM

## 2020-01-25 NOTE — Progress Notes (Signed)
Patient has been placed on bipap. She had rn to call because she felt as if she was having a hard time breathing again due to fluid which is what sent her to hospital. Patient was placed on bipap in er and weaned eventually. Paged NP to ok placing patient back bipap due to distress as she stated she could not breathe on the cpap. Patient tolerated interventions well quickly. Will continue to monitor.

## 2020-01-26 ENCOUNTER — Encounter: Payer: Self-pay | Admitting: Vascular Surgery

## 2020-01-26 LAB — BASIC METABOLIC PANEL
Anion gap: 13 (ref 5–15)
BUN: 40 mg/dL — ABNORMAL HIGH (ref 8–23)
CO2: 26 mmol/L (ref 22–32)
Calcium: 8.2 mg/dL — ABNORMAL LOW (ref 8.9–10.3)
Chloride: 92 mmol/L — ABNORMAL LOW (ref 98–111)
Creatinine, Ser: 5.66 mg/dL — ABNORMAL HIGH (ref 0.44–1.00)
GFR, Estimated: 8 mL/min — ABNORMAL LOW (ref >60)
Glucose, Bld: 132 mg/dL — ABNORMAL HIGH (ref 70–99)
Potassium: 5.3 mmol/L — ABNORMAL HIGH (ref 3.5–5.1)
Sodium: 131 mmol/L — ABNORMAL LOW (ref 135–145)

## 2020-01-26 LAB — HEPARIN LEVEL (UNFRACTIONATED)
Heparin Unfractionated: 0.22 IU/mL — ABNORMAL LOW (ref 0.30–0.70)
Heparin Unfractionated: 0.41 IU/mL (ref 0.30–0.70)
Heparin Unfractionated: 0.11 IU/mL — ABNORMAL LOW (ref 0.30–0.70)

## 2020-01-26 LAB — CBC
HCT: 34 % — ABNORMAL LOW (ref 36.0–46.0)
Hemoglobin: 9.9 g/dL — ABNORMAL LOW (ref 12.0–15.0)
MCH: 29 pg (ref 26.0–34.0)
MCHC: 29.1 g/dL — ABNORMAL LOW (ref 30.0–36.0)
MCV: 99.7 fL (ref 80.0–100.0)
Platelets: 156 10*3/uL (ref 150–400)
RBC: 3.41 MIL/uL — ABNORMAL LOW (ref 3.87–5.11)
RDW: 15.9 % — ABNORMAL HIGH (ref 11.5–15.5)
WBC: 10 10*3/uL (ref 4.0–10.5)
nRBC: 0.2 % (ref 0.0–0.2)

## 2020-01-26 LAB — GLUCOSE, CAPILLARY: Glucose-Capillary: 125 mg/dL — ABNORMAL HIGH (ref 70–99)

## 2020-01-26 LAB — PROTIME-INR
INR: 1.1 (ref 0.8–1.2)
Prothrombin Time: 14.1 seconds (ref 11.4–15.2)

## 2020-01-26 MED ORDER — SENNOSIDES-DOCUSATE SODIUM 8.6-50 MG PO TABS
1.0000 | ORAL_TABLET | Freq: Every day | ORAL | Status: DC
  Administered 2020-01-26 – 2020-01-31 (×6): 1 via ORAL
  Filled 2020-01-26 (×6): qty 1

## 2020-01-26 MED ORDER — HEPARIN BOLUS VIA INFUSION
1000.0000 [IU] | Freq: Once | INTRAVENOUS | Status: AC
  Administered 2020-01-26: 1000 [IU] via INTRAVENOUS
  Filled 2020-01-26: qty 1000

## 2020-01-26 MED ORDER — POLYETHYLENE GLYCOL 3350 17 G PO PACK
17.0000 g | PACK | Freq: Every day | ORAL | Status: DC
  Filled 2020-01-26: qty 1

## 2020-01-26 MED ORDER — SODIUM ZIRCONIUM CYCLOSILICATE 5 G PO PACK
5.0000 g | PACK | Freq: Once | ORAL | Status: AC
  Administered 2020-01-27: 5 g via ORAL
  Filled 2020-01-26: qty 1

## 2020-01-26 MED ORDER — WARFARIN SODIUM 5 MG PO TABS
5.0000 mg | ORAL_TABLET | Freq: Once | ORAL | Status: DC
  Filled 2020-01-26: qty 1

## 2020-01-26 NOTE — Progress Notes (Signed)
RN noted increased venous pressure at this time of 300, noted swelling above the cannulation site. RN unable to return blood back due to infiltration with a blood loss of 262ml. Time left of treatment 1hr and 25min. Patient with no c/o any when asked. RN instructed the patient on applying ice to site and reported this to floor RN Elmo Putt. Total UF of 439ml. Dr. Holley Raring made aware of this situation with no new orders at this time. O2 via [place] at 6LPM continues with saturation of 100%, patient more alert today than yesterday and able to hold a conversation. Patient to return back to her room.

## 2020-01-26 NOTE — Progress Notes (Signed)
[name]  ROUNDING NOTE   Subjective:   Patient continues to be SOB, requiring non re-breather this morning. She received dialysis treatment yesterday post vascular procedure. We will dialyze her again today.  Objective:  Vital signs in last 24 hours:  Temp:  [97.1 F (36.2 C)-98.6 F (37 C)] 97.7 F (36.5 C) (12/08 1215) Pulse Rate:  [64-112] 100 (12/08 1615) Resp:  [14-19] 15 (12/08 1615) BP: (83-121)/(47-99) 113/56 (12/08 1615) SpO2:  [77 %-100 %] 100 % (12/08 1615) Weight:  [106.9 kg] 106.9 kg (12/08 0305)  Weight change: -0.544 kg Filed Weights   01/24/20 1443 01/25/20 0420 01/26/20 0305  Weight: 107.5 kg 107.6 kg 106.9 kg    Intake/Output: I/O last 3 completed shifts: In: 440 [P.O.:360; I.V.:80] Out: 1493 [IOEVO:3500]   Intake/Output this shift:  No intake/output data recorded.  Physical Exam: General:  Resting in bed, appears in moderate respiratory distress  Head: Moist oral mucosal membranes  Eyes:  Anicteric  Lungs:    lungs diminished bilaterally,on non rebreather  Heart:  Regular rate and rhythm  Abdomen:  Soft, nontender, nondistended  Extremities:  No  peripheral edema.  Neurologic:  speech clear and appropriate  Skin: No acute  Lesions or rashes   Access: LUA AVF +bruit , +thrill    Basic Metabolic Panel: Recent Labs  Lab 01/23/20 1143 01/24/20 0626 01/26/20 1121  NA 135 136 131*  K 4.5 5.6* 5.3*  CL 95* 97* 92*  CO2 28 28 26   GLUCOSE 214* 170* 132*  BUN 41* 28* 40*  CREATININE 6.26* 4.90* 5.66*  CALCIUM 8.8* 9.2 8.2*  PHOS  --  5.4*  --     Liver Function Tests: Recent Labs  Lab 01/23/20 1143  AST 22  ALT 18  ALKPHOS 58  BILITOT 0.9  PROT 7.5  ALBUMIN 3.8   No results for input(s): LIPASE, AMYLASE in the last 168 hours. No results for input(s): AMMONIA in the last 168 hours.  CBC: Recent Labs  Lab 01/23/20 1143 01/24/20 0626 01/25/20 0704 01/26/20 0409  WBC 9.6 10.2 10.6* 10.0  HGB 11.3* 10.7* 10.9*  9.9*  HCT 37.6 37.2 36.8 34.0*  MCV 98.7 100.8* 100.5* 99.7  PLT 193 197 182 156    Cardiac Enzymes: No results for input(s): CKTOTAL, CKMB, CKMBINDEX, TROPONINI in the last 168 hours.  BNP: Invalid input(s): POCBNP  CBG: Recent Labs  Lab 01/23/20 2227 01/24/20 1803 01/24/20 2057 01/25/20 1144 01/25/20 2149  GLUCAP 170* 121* 149* 202* 136*    Microbiology: Results for orders placed or performed during the hospital encounter of 01/23/20  Resp Panel by RT-PCR (Flu A&B, Covid) Nasopharyngeal Swab     Status: None   Collection Time: 01/23/20 11:43 AM   Specimen: Nasopharyngeal Swab; Nasopharyngeal(NP) swabs in vial transport medium  Result Value Ref Range Status   SARS Coronavirus 2 by RT PCR NEGATIVE NEGATIVE Final    Comment: (NOTE) SARS-CoV-2 target nucleic acids are NOT DETECTED.  The SARS-CoV-2 RNA is generally detectable in upper respiratory specimens during the acute phase of infection. The lowest concentration of SARS-CoV-2 viral copies this assay can detect is 138 copies/mL. A negative result does not preclude SARS-Cov-2 infection and should not be used as the sole basis for treatment or other patient management decisions. A negative result may occur with  improper specimen collection/handling, submission of specimen other than nasopharyngeal swab, presence of viral mutation(s) within the areas targeted by this assay, and inadequate number of viral copies(<138 copies/mL). A negative  result must be combined with clinical observations, patient history, and epidemiological information. The expected result is Negative.  Fact Sheet for Patients:  EntrepreneurPulse.com.au  Fact Sheet for Healthcare Providers:  IncredibleEmployment.be  This test is no t yet approved or cleared by the [place] FDA and  has been authorized for detection and/or diagnosis of SARS-CoV-2 by FDA under an Emergency Use Authorization (EUA). This EUA  will remain  in effect (meaning this test can be used) for the duration of the COVID-19 declaration under Section 564(b)(1) of the Act, 21 U.S.C.section 360bbb-3(b)(1), unless the authorization is terminated  or revoked sooner.       Influenza A by PCR NEGATIVE NEGATIVE Final   Influenza B by PCR NEGATIVE NEGATIVE Final    Comment: (NOTE) The Xpert Xpress SARS-CoV-2/FLU/RSV plus assay is intended as an aid in the diagnosis of influenza from Nasopharyngeal swab specimens and should not be used as a sole basis for treatment. Nasal washings and aspirates are unacceptable for Xpert Xpress SARS-CoV-2/FLU/RSV testing.  Fact Sheet for Patients: EntrepreneurPulse.com.au  Fact Sheet for Healthcare Providers: IncredibleEmployment.be  This test is not yet approved or cleared by the [place] FDA and has been authorized for detection and/or diagnosis of SARS-CoV-2 by FDA under an Emergency Use Authorization (EUA). This EUA will remain in effect (meaning this test can be used) for the duration of the COVID-19 declaration under Section 564(b)(1) of the Act, 21 U.S.C. section 360bbb-3(b)(1), unless the authorization is terminated or revoked.  Performed at [facility], [address] [place], [place] [address]     Coagulation Studies: Recent Labs    01/24/20 0626 01/25/20 0704 01/26/20 0409  LABPROT 13.9 13.8 14.1  INR 1.1 1.1 1.1    Urinalysis: No results for input(s): COLORURINE, LABSPEC, PHURINE, GLUCOSEU, HGBUR, BILIRUBINUR, KETONESUR, PROTEINUR, UROBILINOGEN, NITRITE, LEUKOCYTESUR in the last 72 hours.  Invalid input(s): APPERANCEUR    Imaging: PERIPHERAL VASCULAR CATHETERIZATION  Result Date: 01/25/2020 See OP Note    Medications:   . sodium chloride    . sodium chloride    . heparin 1,550 Units/hr (01/26/20 1141)   . amiodarone  200 mg Oral Daily  . atorvastatin  40 mg Oral Daily  . Chlorhexidine Gluconate  Cloth  6 each Topical Q0600  . DULoxetine  30 mg Oral Daily  . insulin glargine  45 Units Subcutaneous QHS  . levothyroxine  88 mcg Oral QAC breakfast  . midodrine  10 mg Oral Q M,W,F-HD  . [START ON 01/27/2020] polyethylene glycol  17 g Oral Daily  . senna-docusate  1 tablet Oral QHS  . [START ON 01/27/2020] sodium zirconium cyclosilicate  5 g Oral Once  . Vitamin D (Ergocalciferol)  50,000 Units Oral Q Tue  . warfarin  5 mg Oral ONCE-1600  . Warfarin - Pharmacist Dosing Inpatient   Does not apply q1600   sodium chloride, sodium chloride, alteplase, heparin, lidocaine (PF), lidocaine-prilocaine, ondansetron **OR** ondansetron (ZOFRAN) IV, oxyCODONE-acetaminophen **AND** oxyCODONE, pentafluoroprop-tetrafluoroeth  Assessment/ Plan:  [name] is a 69 y.o.  female with medical problems including Diabetes Type 2, HF with EF of 45%, ESRD on HD MWF, presented to the ED on 01/22/2020 for SOB.  #ESRD on HD MWF #Volume overload [facility]CKA,Dvta [place],MWF, LUA AVF  Patient underwent fistulogram yesterday and received dialysis treatment after that with UF of about 1.5 L. Planning for dialysis again today, as per her regular schedule  # Hyperkalemia Serum potassium improved to 5.3 today  #Anemia of CKD Lab Results  Component Value Date   HGB 9.9 (L) 01/26/2020   Stays at goal  #Secondary Hyperparathyroidism Lab Results  Component Value Date   PTH 173 (H) 01/24/2020   CALCIUM 8.2 (L) 01/26/2020   CAION 1.14 (L) 01/29/2019   PHOS 5.4 (H) 01/24/2020  We will continue monitoring bone mineral metabolism parameters  #Diabetes Type 2 with CKD Lab Results  Component Value Date   HGBA1C 6.8 (H) 09/07/2019  Patient is on Insuline Glargine    LOS: 2 [name] 12/8/20215:06 PM

## 2020-01-26 NOTE — Progress Notes (Signed)
OT Cancellation Note  Patient Details Name: [name] MRN: [id number] DOB: 1950/06/06   Cancelled Treatment:    Reason Eval/Treat Not Completed: Medical issues which prohibited therapy. Order received and chart reviewed. Pt noted to have most recent K+ value of 5.3. This places her outside of the parameters recommended for exertional activity at this time. Will hold until pt medically appropriate and re-attempt at a later date/time. Thank you.  Shara Blazing, M.S., OTR/L Ascom: [phone] 01/26/20, 1:14 PM

## 2020-01-26 NOTE — Consult Note (Signed)
[place] for Warfarin/Heparin Indication: Mechanical valve  Allergies  Allergen Reactions  . Buspirone Other (See Comments)    Weakness  . Diltiazem Hcl Palpitations  . Gabapentin Palpitations  . Hydralazine Rash  . Lisinopril Cough  . Lovastatin Palpitations  . Metformin Diarrhea  . Pravastatin Other (See Comments)    Insomnia  . Sitagliptin Other (See Comments)    Constipation  . Tramadol Itching   Patient Measurements: Height: 5\' 3"  (160 cm) Weight: 106.9 kg (235 lb 11.2 oz) IBW/kg (Calculated) : 52.4  Vital Signs: Temp: 97.1 F (36.2 C) (12/08 0507) Temp Source: Oral (12/07 2107) BP: 101/58 (12/08 0505) Pulse Rate: 99 (12/08 0505)  Labs: Recent Labs    01/23/20 1142 01/23/20 1143 01/23/20 1526 01/24/20 0626 01/24/20 0626 01/24/20 2255 01/25/20 0704 01/26/20 0409  HGB   < > 11.3*  --  10.7*   < >  --  10.9* 9.9*  HCT   < > 37.6  --  37.2  --   --  36.8 34.0*  PLT   < > 193  --  197  --   --  182 156  LABPROT   < >  --   --  13.9  --   --  13.8 14.1  INR   < >  --   --  1.1  --   --  1.1 1.1  HEPARINUNFRC  --   --   --   --   --  0.14* 0.22* 0.22*  CREATININE  --  6.26*  --  4.90*  --   --   --   --   TROPONINIHS  --  78* 82*  --   --   --   --   --    < > = values in this interval not displayed.   Estimated Creatinine Clearance: 12.7 mL/min (A) (by C-G formula based on SCr of 4.9 mg/dL (H)).  Medical History: Past Medical History:  Diagnosis Date  . Anemia   . Anxiety   . Aortic valve stenosis   . Arrhythmia    atrial fibrillation  . Arthritis    feet, legs  . B12 deficiency   . Bowel obstruction ([place])   . CHF (congestive heart failure) ([place])   . CKD (chronic kidney disease)    protein in urine  . Colostomy in place [facility])   . Diabetes mellitus without complication ([place])   . Dialysis patient [facility])    T/Th/Sa  . Diverticulitis large intestine   . Dysrhythmia   . Ectopic atrial tachycardia ([place])   .  FSGS (focal segmental glomerulosclerosis)   . Gastritis   . GI bleed   . Heart murmur   . Hyperlipidemia   . Hypertension   . Hypothyroidism   . Hypothyroidism    unspecified  . MRSA (methicillin resistant Staphylococcus aureus)    at abdominal wound.  Jan 2017.  Treated.   . Neuropathy   . Neuropathy in diabetes ([place])   . Obesity   . Pedal edema   . Psoriasis   . Shortness of breath dyspnea   . Sleep apnea    CPAP  . Vertigo    Medications:  Warfarin 1.5mg  daily; last dose was 12/4 - pharmacy tech confirmed dose with patient's daughter  Assessment: 69yo female with a past medical history of anemia, anxiety, diabetes, CHF, CKD ESRD (hemodialysis MWF), hypertension, obesity, presents to the emergency department for shortness of breath. Patient was on  Eliquis in the past and was switched to warfarin around October 2021. Pharmacy has been consulted for warfarin dosing and monitoring for afib and s/p TAVR (2019). Patient [facility] at the [facility]. Hgb 11.3 slightly decreased but appears to be patient's baseline. Spoke with patient's daughter - she stated patient was previously on warfarin 2mg  and was changed to 1mg  around 12/19/19 due to having darker stools, then patient was recently changed to warfarin 1.5mg  12/2. CHADSVASc of 5. Plan to bridge warfarin with heparin gtt.   DDI: amiodarone and levothyroxine (also PTA meds)   12/6 2255 HL 0.14  increase Heparin to 1200 units/hr  12/7 0704 HL 0.22 1000 unit bolus and increase heparin infusion to 1350 units/hr.  12/8 0409 HL 0.22 1000 unit bolus and increase heparin infusion to 1550 units/hr  Date INR Dose 12/5 1.1 Never given 12/6  1.1 3 mg  12/7 1.1 3 mg   Goal of Therapy:  INR 2-3 for warfarin  Heparin level 0.3 - 0.7.  Monitor platelets by anticoagulation protocol: Yes   Plan:  Heparin:   Heparin level is still subtherapeutic. Will give a 1000 unit bolus and increase heparin infusion to 1550 units/hr. Recheck  heparin level in 8 hours. CBC daily while on heparin.   Warfarin:  INR subtherapeutic. Will give warfarin 3mg  tonight x 1. Daily INR ordered.    Ena Dawley, PharmD 01/26/2020 5:25 AM

## 2020-01-26 NOTE — Progress Notes (Signed)
Mobility Specialist - Progress Note   01/26/20 1200  Mobility  Activity Contraindicated/medical hold  Mobility performed by Mobility specialist    Per chart review, pt's K+ levels are elevated today. Currently 5.3, sitting outside safety guidelines for mobility. Will attempt session at another date/time as appropriate.    Kathee Delton Mobility Specialist 01/26/20, 12:01 PM

## 2020-01-26 NOTE — Consult Note (Signed)
[place] for Warfarin/Heparin Indication: afib s/p TAVR.   Allergies  Allergen Reactions  . Buspirone Other (See Comments)    Weakness  . Diltiazem Hcl Palpitations  . Gabapentin Palpitations  . Hydralazine Rash  . Lisinopril Cough  . Lovastatin Palpitations  . Metformin Diarrhea  . Pravastatin Other (See Comments)    Insomnia  . Sitagliptin Other (See Comments)    Constipation  . Tramadol Itching   Patient Measurements: Height: 5\' 3"  (160 cm) Weight: 106.9 kg (235 lb 11.2 oz) IBW/kg (Calculated) : 52.4  Vital Signs: Temp: 97.7 F (36.5 C) (12/08 1215) Temp Source: Oral (12/08 1215) BP: 115/99 (12/08 1215) Pulse Rate: 98 (12/08 1215)  Labs: Recent Labs    01/24/20 0626 01/24/20 2255 01/25/20 0704 01/26/20 0409 01/26/20 1121 01/26/20 1356  HGB 10.7*  --  10.9* 9.9*  --   --   HCT 37.2  --  36.8 34.0*  --   --   PLT 197  --  182 156  --   --   LABPROT 13.9  --  13.8 14.1  --   --   INR 1.1  --  1.1 1.1  --   --   HEPARINUNFRC  --    < > 0.22* 0.22*  --  0.41  CREATININE 4.90*  --   --   --  5.66*  --    < > = values in this interval not displayed.   Estimated Creatinine Clearance: 11 mL/min (A) (by C-G formula based on SCr of 5.66 mg/dL (H)).  Medical History: Past Medical History:  Diagnosis Date  . Anemia   . Anxiety   . Aortic valve stenosis   . Arrhythmia    atrial fibrillation  . Arthritis    feet, legs  . B12 deficiency   . Bowel obstruction ([place])   . CHF (congestive heart failure) ([place])   . CKD (chronic kidney disease)    protein in urine  . Colostomy in place [facility])   . Diabetes mellitus without complication ([place])   . Dialysis patient [facility])    T/Th/Sa  . Diverticulitis large intestine   . Dysrhythmia   . Ectopic atrial tachycardia ([place])   . FSGS (focal segmental glomerulosclerosis)   . Gastritis   . GI bleed   . Heart murmur   . Hyperlipidemia   . Hypertension   . Hypothyroidism   .  Hypothyroidism    unspecified  . MRSA (methicillin resistant Staphylococcus aureus)    at abdominal wound.  Jan 2017.  Treated.   . Neuropathy   . Neuropathy in diabetes ([place])   . Obesity   . Pedal edema   . Psoriasis   . Shortness of breath dyspnea   . Sleep apnea    CPAP  . Vertigo    Medications:  Warfarin 1.5mg  daily; last dose was 12/4 - pharmacy tech confirmed dose with patient's daughter  Assessment: 69yo female with a past medical history of anemia, anxiety, diabetes, CHF, CKD ESRD (hemodialysis MWF), hypertension, obesity, presents to the emergency department for shortness of breath. Patient was on Eliquis in the past and was switched to warfarin around October 2021. Pharmacy has been consulted for warfarin dosing and monitoring for afib and s/p TAVR (2019). Patient follows at the [facility]. Hgb 11.3 slightly decreased but appears to be patient's baseline. Spoke with patient's daughter - she stated patient was previously on warfarin 2mg  and was changed to 1mg  around 12/19/19 due to  having darker stools, then patient was recently changed to warfarin 1.5mg  12/2. CHADSVASc of 5. Plan to bridge warfarin with heparin gtt.   DDI: amiodarone and levothyroxine    12/6 2255 HL 0.14  increase Heparin to 1200 units/hr  12/7 0704 HL 0.22 1000 unit bolus and increase heparin infusion to 1350 units/hr.  12/8 0409 HL 0.22 1000 unit bolus and increase heparin infusion to 1550 units/hr 12/8 1356 HL 0.41  Date INR Dose 12/5 1.1 Not given 12/6  1.1 3 mg  12/7 1.1 3 mg - not given.  12/8 1.1 5 mg   Goal of Therapy:  INR 2-3 for warfarin  Heparin level 0.3 - 0.7.  Monitor platelets by anticoagulation protocol: Yes   Plan:  Heparin:   Heparin level is therapeutic. Will continue heparin infusion at 1550 units/hr. Recheck heparin level in 8 hours. CBC daily while on heparin.   Warfarin:  INR subtherapeutic. Will give warfarin 5mg  tonight x 1. Daily INR ordered.    Oswald Hillock, PharmD, BCPS 01/26/2020 3:32 PM

## 2020-01-26 NOTE — Progress Notes (Signed)
PT Cancellation Note  Patient Details Name: [name] MRN: [id number] DOB: Sep 14, 1950   Cancelled Treatment:    Reason Eval/Treat Not Completed: Medical issues which prohibited therapy: Order received and chart reviewed. Pt noted to have most recent K+ value of 5.3. This places her outside of the parameters recommended for exertional activity at this time. Will hold PT evaluation until pt medically appropriate and re-attempt at a later date/time.   Linus Salmons PT, DPT 01/26/20, 2:04 PM

## 2020-01-26 NOTE — Consult Note (Signed)
[place] for Warfarin/Heparin Indication: afib s/p TAVR.   Allergies  Allergen Reactions  . Buspirone Other (See Comments)    Weakness  . Diltiazem Hcl Palpitations  . Gabapentin Palpitations  . Hydralazine Rash  . Lisinopril Cough  . Lovastatin Palpitations  . Metformin Diarrhea  . Pravastatin Other (See Comments)    Insomnia  . Sitagliptin Other (See Comments)    Constipation  . Tramadol Itching   Patient Measurements: Height: 5\' 3"  (160 cm) Weight: 106.9 kg (235 lb 11.2 oz) IBW/kg (Calculated) : 52.4  Vital Signs: Temp: 98.6 F (37 C) (12/08 0749) Temp Source: Oral (12/07 2107) BP: 121/93 (12/08 0749) Pulse Rate: 112 (12/08 0749)  Labs: Recent Labs    01/23/20 1142 01/23/20 1143 01/23/20 1526 01/24/20 0626 01/24/20 0626 01/24/20 2255 01/25/20 0704 01/26/20 0409  HGB   < > 11.3*  --  10.7*   < >  --  10.9* 9.9*  HCT   < > 37.6  --  37.2  --   --  36.8 34.0*  PLT   < > 193  --  197  --   --  182 156  LABPROT   < >  --   --  13.9  --   --  13.8 14.1  INR   < >  --   --  1.1  --   --  1.1 1.1  HEPARINUNFRC  --   --   --   --   --  0.14* 0.22* 0.22*  CREATININE  --  6.26*  --  4.90*  --   --   --   --   TROPONINIHS  --  78* 82*  --   --   --   --   --    < > = values in this interval not displayed.   Estimated Creatinine Clearance: 12.7 mL/min (A) (by C-G formula based on SCr of 4.9 mg/dL (H)).  Medical History: Past Medical History:  Diagnosis Date  . Anemia   . Anxiety   . Aortic valve stenosis   . Arrhythmia    atrial fibrillation  . Arthritis    feet, legs  . B12 deficiency   . Bowel obstruction ([place])   . CHF (congestive heart failure) ([place])   . CKD (chronic kidney disease)    protein in urine  . Colostomy in place [facility])   . Diabetes mellitus without complication ([place])   . Dialysis patient [facility])    T/Th/Sa  . Diverticulitis large intestine   . Dysrhythmia   . Ectopic atrial tachycardia ([place])   . FSGS  (focal segmental glomerulosclerosis)   . Gastritis   . GI bleed   . Heart murmur   . Hyperlipidemia   . Hypertension   . Hypothyroidism   . Hypothyroidism    unspecified  . MRSA (methicillin resistant Staphylococcus aureus)    at abdominal wound.  Jan 2017.  Treated.   . Neuropathy   . Neuropathy in diabetes ([place])   . Obesity   . Pedal edema   . Psoriasis   . Shortness of breath dyspnea   . Sleep apnea    CPAP  . Vertigo    Medications:  Warfarin 1.5mg  daily; last dose was 12/4 - pharmacy tech confirmed dose with patient's daughter  Assessment: 69yo female with a past medical history of anemia, anxiety, diabetes, CHF, CKD ESRD (hemodialysis MWF), hypertension, obesity, presents to the emergency department for shortness of breath. Patient  was on Eliquis in the past and was switched to warfarin around October 2021. Pharmacy has been consulted for warfarin dosing and monitoring for afib and s/p TAVR (2019). Patient follows at the [facility]. Hgb 11.3 slightly decreased but appears to be patient's baseline. Spoke with patient's daughter - she stated patient was previously on warfarin 2mg  and was changed to 1mg  around 12/19/19 due to having darker stools, then patient was recently changed to warfarin 1.5mg  12/2. CHADSVASc of 5. Plan to bridge warfarin with heparin gtt.   DDI: amiodarone and levothyroxine    12/6 2255 HL 0.14  increase Heparin to 1200 units/hr  12/7 0704 HL 0.22 1000 unit bolus and increase heparin infusion to 1350 units/hr.  12/8 0409 HL 0.22 1000 unit bolus and increase heparin infusion to 1550 units/hr  Date INR Dose 12/5 1.1 Not given 12/6  1.1 3 mg  12/7 1.1 3 mg - not given.  12/8 1.1 5 mg   Goal of Therapy:  INR 2-3 for warfarin  Heparin level 0.3 - 0.7.  Monitor platelets by anticoagulation protocol: Yes   Plan:  Heparin:   Heparin level is still subtherapeutic. Will give a 1000 unit bolus and increase heparin infusion to 1550 units/hr. Recheck  heparin level in 8 hours. CBC daily while on heparin.   Warfarin:  INR subtherapeutic. Will give warfarin 5mg  tonight x 1. Daily INR ordered.    Oswald Hillock, PharmD 01/26/2020 8:58 AM

## 2020-01-26 NOTE — Progress Notes (Addendum)
PROGRESS NOTE    ODELL [name]  UQJ:[id number] DOB: 1950/11/30 DOA: 01/23/2020 PCP: Adin Hector, MD   Brief Narrative: Taken from H&P  [name] is a 69 y.o. female with medical history significant for insulin-dependent diabetes mellitus type 2, heart failure with reduced ejection fraction LVEF of 45%, end-stage renal disease on hemodialysis, obstructive sleep apnea not wearing CPAP, hypertension, aortic atherosclerosis, persistent atrial fibrillation, status post TAVR, morbid obesity, acquired hypothyroid, history of focal segmental glomerulosclerosis, presented to the emergency department via EMS for chief concerns of shortness of breath.   Shortness of breath that started progressively worsened throughout the day 01/22/20. Daughter denies fever, nausea, vomiting, chest pain, abdominal pain, dysuria, hematuria, diarrhea, blood in her stool. Patient and daughter states that she used her home oxygen without much relief.   Family endorses new increased productive cough with yellow mucus throughout the day. Endorses increased swelling in her legs.   Patient has frequent recent hospitalizations at [facility] with similar symptoms.  Also found to have reduced EF to 25% on repeat echo and a valvular thrombus.  Her Eliquis was transitioned to warfarin.  Also recent GI bleed with aspirin and warfarin, initially discontinue both and then later started on warfarin only. She was also recommended to have dialysis 4 times a week to prevent volume overload.  Hardly makes any urine, volume is being managed with dialysis.  Cardiology was consulted.  Subjective: Continues w/ dyspnea, improved w/ non-rebreather. Hasn't required bipap since evening of 12/6. Plan for dialysis today. Feeling fatigued.  Assessment & Plan:   Principal Problem:   Acute hypoxemic respiratory failure (HCC) Active Problems:   Obesity   Anemia   Benign essential HTN   Acquired atrophy of thyroid   FSGS (focal segmental  glomerulosclerosis)   Acute kidney injury superimposed on chronic kidney disease (HCC)   S/P TAVR (transcatheter aortic valve replacement)   DM type 2 with diabetic peripheral neuropathy (HCC)   Acute respiratory failure with hypoxia (HCC)   DDD (degenerative disc disease), lumbosacral   OSA (obstructive sleep apnea)  Acute on chronic hypoxic respiratory failure.  Most likely cardiogenic pulmonary edema/volume overload secondary to systolic CHF exacerbation., complicated by ESRD. Makes minimal urine. Dialyzed 12/7 after angioplasty of brachiocephalic fiscula by IR, plan for repeat today.  Baseline 2-3 L [place] O2, currently on non-rebreather. Recent echocardiogram with worsening of her EF to 25%, she also had right and left cardiac catheterization recently done at [facility]. -Volume is being managed with dialysis-going for dialysis today  -Might need more frequent dialysis to prevent recurrent hospitalizations with similar symptoms. -Continue with supportive care.  HFrEF  Most recent EF 20%. -Cardiology was consulted-appreciate their recommendations -defer institution of evidence-based meds to cardiology - volume mgmt via dialysis  ESRD on HD(MWF).  Per patient she is quite compliant with her dialysis. She also watch her diet and restrict her fluid. -Continue with dialysis-she might need four dialysis per week to avoid fluid overload. -Nephrology is managing her dialysis needs.  Persistent A. fib.  She was previously on Eliquis which was transitioned to warfarin due to history of aortic valve thrombosis. Rate controlled this morning in the 90s -Continue with amiodarone -Continue warfarin per pharmacy.  Hyperkalemia 5.6 now 5.3 - start lokelma on non-dialysis days, first dose tomorrow  History of chronic pain. -Continue home oxycodone  Aortic stenosis s/p TAVR.  History of recent aortic valve thrombosis. -Continue Coumadin.  Insulin-dependent diabetes mellitus. Well controlled, a1c 6.8  08/2019 -Continue SSI  Obstructive sleep apnea.  She is unable to tolerate auto CPAP. She will need outpatient pulmonology referral for further recommendations.  Hypothyroidism. -Continue home Synthroid.  Constipation - Standing miralax/senna  Debility - PT/OT consulted  Morbid obesity. Body mass index is 41.75 kg/m.  This will complicate overall prognosis.  Objective: Vitals:   01/26/20 0305 01/26/20 0505 01/26/20 0507 01/26/20 0749  BP:  (!) 101/58  (!) 121/93  Pulse:  99  (!) 112  Resp:    18  Temp:   (!) 97.1 F (36.2 C) 98.6 F (37 C)  TempSrc:      SpO2:  (!) 77%  96%  Weight: 106.9 kg     Height:        Intake/Output Summary (Last 24 hours) at 01/26/2020 1125 Last data filed at 01/26/2020 0535 Gross per 24 hour  Intake --  Output 1493 ml  Net -1493 ml   Filed Weights   01/24/20 1443 01/25/20 0420 01/26/20 0305  Weight: 107.5 kg 107.6 kg 106.9 kg    Examination:  General.  Morbidly obese lady, mildly distressed. Pulmonary.  Bilateral basal crackles, no increased wob CV.  Regular rate and rhythm, no JVD, rub or murmur. Abdomen.  Soft, nontender, nondistended, BS positive. Morbidly obese CNS.  Alert and oriented x3.  No focal neurologic deficit. Extremities.  Trace LE edema, no cyanosis, pulses intact and symmetrical. Psychiatry.  Judgment and insight appears normal.  DVT prophylaxis: Coumadin Code Status: Full Family Communication: Discussed with patient Disposition Plan:  Status is: Inpatient  Remains inpatient appropriate because:Inpatient level of care appropriate due to severity of illness   Dispo: The patient is from: Home              Anticipated d/c is to: Home (patient declines SNF)              Anticipated d/c date is: 2-5 days              Patient currently is not medically stable to d/c.   Consultants:   Cardiology  Nephrology  Procedures:  Antimicrobials:   Data Reviewed: I have personally reviewed following labs and  imaging studies  CBC: Recent Labs  Lab 01/23/20 1143 01/24/20 0626 01/25/20 0704 01/26/20 0409  WBC 9.6 10.2 10.6* 10.0  HGB 11.3* 10.7* 10.9* 9.9*  HCT 37.6 37.2 36.8 34.0*  MCV 98.7 100.8* 100.5* 99.7  PLT 193 197 182 914   Basic Metabolic Panel: Recent Labs  Lab 01/23/20 1143 01/24/20 0626  NA 135 136  K 4.5 5.6*  CL 95* 97*  CO2 28 28  GLUCOSE 214* 170*  BUN 41* 28*  CREATININE 6.26* 4.90*  CALCIUM 8.8* 9.2  PHOS  --  5.4*   GFR: Estimated Creatinine Clearance: 12.7 mL/min (A) (by C-G formula based on SCr of 4.9 mg/dL (H)). Liver Function Tests: Recent Labs  Lab 01/23/20 1143  AST 22  ALT 18  ALKPHOS 58  BILITOT 0.9  PROT 7.5  ALBUMIN 3.8   No results for input(s): LIPASE, AMYLASE in the last 168 hours. No results for input(s): AMMONIA in the last 168 hours. Coagulation Profile: Recent Labs  Lab 01/23/20 1142 01/24/20 0626 01/25/20 0704 01/26/20 0409  INR 1.1 1.1 1.1 1.1   Cardiac Enzymes: No results for input(s): CKTOTAL, CKMB, CKMBINDEX, TROPONINI in the last 168 hours. BNP (last 3 results) No results for input(s): PROBNP in the last 8760 hours. HbA1C: No results for input(s): HGBA1C in the last 72 hours. CBG: Recent Labs  Lab 01/23/20 2227 01/24/20 1803 01/24/20 2057 01/25/20 1144 01/25/20 2149  GLUCAP 170* 121* 149* 202* 136*   Lipid Profile: No results for input(s): CHOL, HDL, LDLCALC, TRIG, CHOLHDL, LDLDIRECT in the last 72 hours. Thyroid Function Tests: Recent Labs    01/23/20 1142  TSH 6.056*   Anemia Panel: No results for input(s): VITAMINB12, FOLATE, FERRITIN, TIBC, IRON, RETICCTPCT in the last 72 hours. Sepsis Labs: No results for input(s): PROCALCITON, LATICACIDVEN in the last 168 hours.  Recent Results (from the past 240 hour(s))  Resp Panel by RT-PCR (Flu A&B, Covid) Nasopharyngeal Swab     Status: None   Collection Time: 01/23/20 11:43 AM   Specimen: Nasopharyngeal Swab; Nasopharyngeal(NP) swabs in vial transport  medium  Result Value Ref Range Status   SARS Coronavirus 2 by RT PCR NEGATIVE NEGATIVE Final    Comment: (NOTE) SARS-CoV-2 target nucleic acids are NOT DETECTED.  The SARS-CoV-2 RNA is generally detectable in upper respiratory specimens during the acute phase of infection. The lowest concentration of SARS-CoV-2 viral copies this assay can detect is 138 copies/mL. A negative result does not preclude SARS-Cov-2 infection and should not be used as the sole basis for treatment or other patient management decisions. A negative result may occur with  improper specimen collection/handling, submission of specimen other than nasopharyngeal swab, presence of viral mutation(s) within the areas targeted by this assay, and inadequate number of viral copies(<138 copies/mL). A negative result must be combined with clinical observations, patient history, and epidemiological information. The expected result is Negative.  Fact Sheet for Patients:  EntrepreneurPulse.com.au  Fact Sheet for Healthcare Providers:  IncredibleEmployment.be  This test is no t yet approved or cleared by the [place] FDA and  has been authorized for detection and/or diagnosis of SARS-CoV-2 by FDA under an Emergency Use Authorization (EUA). This EUA will remain  in effect (meaning this test can be used) for the duration of the COVID-19 declaration under Section 564(b)(1) of the Act, 21 U.S.C.section 360bbb-3(b)(1), unless the authorization is terminated  or revoked sooner.       Influenza A by PCR NEGATIVE NEGATIVE Final   Influenza B by PCR NEGATIVE NEGATIVE Final    Comment: (NOTE) The Xpert Xpress SARS-CoV-2/FLU/RSV plus assay is intended as an aid in the diagnosis of influenza from Nasopharyngeal swab specimens and should not be used as a sole basis for treatment. Nasal washings and aspirates are unacceptable for Xpert Xpress SARS-CoV-2/FLU/RSV testing.  Fact Sheet for  Patients: EntrepreneurPulse.com.au  Fact Sheet for Healthcare Providers: IncredibleEmployment.be  This test is not yet approved or cleared by the [place] FDA and has been authorized for detection and/or diagnosis of SARS-CoV-2 by FDA under an Emergency Use Authorization (EUA). This EUA will remain in effect (meaning this test can be used) for the duration of the COVID-19 declaration under Section 564(b)(1) of the Act, 21 U.S.C. section 360bbb-3(b)(1), unless the authorization is terminated or revoked.  Performed at [facility], [address], [place], [place] [address]      Radiology Studies: PERIPHERAL VASCULAR CATHETERIZATION  Result Date: 01/25/2020 See OP Note   Scheduled Meds: . amiodarone  200 mg Oral Daily  . atorvastatin  40 mg Oral Daily  . Chlorhexidine Gluconate Cloth  6 each Topical Q0600  . DULoxetine  30 mg Oral Daily  . insulin glargine  45 Units Subcutaneous QHS  . levothyroxine  88 mcg Oral QAC breakfast  . midodrine  10 mg Oral Q M,W,F-HD  . Vitamin D (Ergocalciferol)  50,000 Units Oral Q Tue  . warfarin  5 mg Oral ONCE-1600  . Warfarin - Pharmacist Dosing Inpatient   Does not apply q1600   Continuous Infusions: . sodium chloride    . sodium chloride    . heparin 1,550 Units/hr (01/26/20 0547)     LOS: 2 days   Time spent: 35 minutes.  Desma Maxim, MD [facility]  If 7PM-7AM, please contact night-coverage [email or web address]  01/26/2020, 11:25 AM

## 2020-01-27 ENCOUNTER — Encounter: Payer: Medicare Other | Admitting: Student in an Organized Health Care Education/Training Program

## 2020-01-27 LAB — CBC
HCT: 32.4 % — ABNORMAL LOW (ref 36.0–46.0)
Hemoglobin: 9.3 g/dL — ABNORMAL LOW (ref 12.0–15.0)
MCH: 28.8 pg (ref 26.0–34.0)
MCHC: 28.7 g/dL — ABNORMAL LOW (ref 30.0–36.0)
MCV: 100.3 fL — ABNORMAL HIGH (ref 80.0–100.0)
Platelets: 147 K/uL — ABNORMAL LOW (ref 150–400)
RBC: 3.23 MIL/uL — ABNORMAL LOW (ref 3.87–5.11)
RDW: 15.6 % — ABNORMAL HIGH (ref 11.5–15.5)
WBC: 7.1 K/uL (ref 4.0–10.5)
nRBC: 0 % (ref 0.0–0.2)

## 2020-01-27 LAB — BASIC METABOLIC PANEL
Anion gap: 13 (ref 5–15)
BUN: 39 mg/dL — ABNORMAL HIGH (ref 8–23)
CO2: 27 mmol/L (ref 22–32)
Calcium: 8.2 mg/dL — ABNORMAL LOW (ref 8.9–10.3)
Chloride: 93 mmol/L — ABNORMAL LOW (ref 98–111)
Creatinine, Ser: 5.38 mg/dL — ABNORMAL HIGH (ref 0.44–1.00)
GFR, Estimated: 8 mL/min — ABNORMAL LOW (ref >60)
Glucose, Bld: 74 mg/dL (ref 70–99)
Potassium: 4.9 mmol/L (ref 3.5–5.1)
Sodium: 133 mmol/L — ABNORMAL LOW (ref 135–145)

## 2020-01-27 LAB — RENAL FUNCTION PANEL
Albumin: 3.3 g/dL — ABNORMAL LOW (ref 3.5–5.0)
Anion gap: 11 (ref 5–15)
BUN: 42 mg/dL — ABNORMAL HIGH (ref 8–23)
CO2: 27 mmol/L (ref 22–32)
Calcium: 8.1 mg/dL — ABNORMAL LOW (ref 8.9–10.3)
Chloride: 93 mmol/L — ABNORMAL LOW (ref 98–111)
Creatinine, Ser: 5.75 mg/dL — ABNORMAL HIGH (ref 0.44–1.00)
GFR, Estimated: 7 mL/min — ABNORMAL LOW (ref >60)
Glucose, Bld: 59 mg/dL — ABNORMAL LOW (ref 70–99)
Phosphorus: 5.7 mg/dL — ABNORMAL HIGH (ref 2.5–4.6)
Potassium: 4.8 mmol/L (ref 3.5–5.1)
Sodium: 131 mmol/L — ABNORMAL LOW (ref 135–145)

## 2020-01-27 LAB — HEPARIN LEVEL (UNFRACTIONATED)
Heparin Unfractionated: 0.52 IU/mL (ref 0.30–0.70)
Heparin Unfractionated: 0.37 IU/mL (ref 0.30–0.70)

## 2020-01-27 LAB — GLUCOSE, CAPILLARY
Glucose-Capillary: 86 mg/dL (ref 70–99)
Glucose-Capillary: 77 mg/dL (ref 70–99)
Glucose-Capillary: 81 mg/dL (ref 70–99)
Glucose-Capillary: 51 mg/dL — ABNORMAL LOW (ref 70–99)
Glucose-Capillary: 75 mg/dL (ref 70–99)
Glucose-Capillary: 104 mg/dL — ABNORMAL HIGH (ref 70–99)
Glucose-Capillary: 104 mg/dL — ABNORMAL HIGH (ref 70–99)

## 2020-01-27 LAB — PHOSPHORUS: Phosphorus: 5 mg/dL — ABNORMAL HIGH (ref 2.5–4.6)

## 2020-01-27 LAB — PROTIME-INR
INR: 1.2 (ref 0.8–1.2)
Prothrombin Time: 14.3 seconds (ref 11.4–15.2)

## 2020-01-27 LAB — BRAIN NATRIURETIC PEPTIDE: B Natriuretic Peptide: 1041.5 pg/mL — ABNORMAL HIGH (ref 0.0–100.0)

## 2020-01-27 MED ORDER — LOSARTAN POTASSIUM 25 MG PO TABS
25.0000 mg | ORAL_TABLET | Freq: Every day | ORAL | Status: DC
  Administered 2020-01-27 – 2020-02-01 (×5): 25 mg via ORAL
  Filled 2020-01-27 (×5): qty 1

## 2020-01-27 MED ORDER — INSULIN ASPART 100 UNIT/ML ~~LOC~~ SOLN: 0.0000 [IU] | Freq: Three times a day (TID) | SUBCUTANEOUS | Status: DC

## 2020-01-27 MED ORDER — WARFARIN SODIUM 5 MG PO TABS
5.0000 mg | ORAL_TABLET | Freq: Once | ORAL | Status: AC
  Administered 2020-01-27: 5 mg via ORAL
  Filled 2020-01-27: qty 1

## 2020-01-27 MED ORDER — INSULIN GLARGINE 100 UNIT/ML ~~LOC~~ SOLN
35.0000 [IU] | Freq: Every day | SUBCUTANEOUS | Status: DC
  Filled 2020-01-27: qty 0.35

## 2020-01-27 MED ORDER — POLYETHYLENE GLYCOL 3350 17 G PO PACK
51.0000 g | PACK | Freq: Every day | ORAL | Status: DC
  Administered 2020-01-27 – 2020-01-29 (×2): 51 g via ORAL
  Filled 2020-01-27 (×5): qty 3

## 2020-01-27 MED ORDER — LACTULOSE 10 GM/15ML PO SOLN
20.0000 g | Freq: Every day | ORAL | Status: DC | PRN
  Administered 2020-01-27: 20 g via ORAL
  Filled 2020-01-27: qty 30

## 2020-01-27 NOTE — Care Management Important Message (Signed)
Important Message  Patient Details  Name: [name] MRN: [id number] Date of Birth: 1950/11/08   Medicare Important Message Given:  Yes     Dannette Barbara 01/27/2020, 1:18 PM

## 2020-01-27 NOTE — Progress Notes (Addendum)
[name]  ROUNDING NOTE   Subjective:   Patient seen for dialysis treatment yesterday, got interrupted at about half time of the treatment. We will plan for dialysis again tomorrow.  Objective:  Vital signs in last 24 hours:  Temp:  [97.3 F (36.3 C)-98.5 F (36.9 C)] 98 F (36.7 C) (12/09 1134) Pulse Rate:  [82-111] 88 (12/09 1134) Resp:  [12-18] 18 (12/09 1134) BP: (92-113)/(52-81) 102/52 (12/09 1134) SpO2:  [87 %-100 %] 100 % (12/09 1134) Weight:  [106.8 kg] 106.8 kg (12/09 0500)  Weight change: -0.113 kg Filed Weights   01/25/20 0420 01/26/20 0305 01/27/20 0500  Weight: 107.6 kg 106.9 kg 106.8 kg    Intake/Output: I/O last 3 completed shifts: In: 353.5 [I.V.:353.5] Out: 0    Intake/Output this shift:  No intake/output data recorded.  Physical Exam: General:  In no acute distress, sitting at the side of the bed  Head:  Normocephalic, atraumatic  Eyes:  Sclera and conjunctiva clear  Lungs:   Respirations symmetrical, slightly labored, lungs with fine crackles on O2 5 L via nasal cannula  Heart:  S1S2, no rubs or gallops  Abdomen:  Soft, nontender, nondistended  Extremities:  No  peripheral edema.  Neurologic:  Awake, alert, speech clear  Skin: No acute  Lesions or rashes   Access: LUA AVF +bruit , +thrill    Basic Metabolic Panel: Recent Labs  Lab 01/23/20 1143 01/24/20 0626 01/26/20 1121 01/27/20 0526 01/27/20 0829  NA 135 136 131* 133* 131*  K 4.5 5.6* 5.3* 4.9 4.8  CL 95* 97* 92* 93* 93*  CO2 28 28 26 27 27   GLUCOSE 214* 170* 132* 74 59*  BUN 41* 28* 40* 39* 42*  CREATININE 6.26* 4.90* 5.66* 5.38* 5.75*  CALCIUM 8.8* 9.2 8.2* 8.2* 8.1*  PHOS  --  5.4*  --  5.0* 5.7*    Liver Function Tests: Recent Labs  Lab 01/23/20 1143 01/27/20 0829  AST 22  --   ALT 18  --   ALKPHOS 58  --   BILITOT 0.9  --   PROT 7.5  --   ALBUMIN 3.8 3.3*   No results for input(s): LIPASE, AMYLASE in the last 168 hours. No results for input(s):  AMMONIA in the last 168 hours.  CBC: Recent Labs  Lab 01/23/20 1143 01/24/20 0626 01/25/20 0704 01/26/20 0409 01/27/20 0829  WBC 9.6 10.2 10.6* 10.0 7.1  HGB 11.3* 10.7* 10.9* 9.9* 9.3*  HCT 37.6 37.2 36.8 34.0* 32.4*  MCV 98.7 100.8* 100.5* 99.7 100.3*  PLT 193 197 182 156 147*    Cardiac Enzymes: No results for input(s): CKTOTAL, CKMB, CKMBINDEX, TROPONINI in the last 168 hours.  BNP: Invalid input(s): POCBNP  CBG: Recent Labs  Lab 01/24/20 1803 01/24/20 2057 01/25/20 1144 01/25/20 2149 01/26/20 2144  GLUCAP 121* 149* 202* 136* 125*    Microbiology: Results for orders placed or performed during the hospital encounter of 01/23/20  Resp Panel by RT-PCR (Flu A&B, Covid) Nasopharyngeal Swab     Status: None   Collection Time: 01/23/20 11:43 AM   Specimen: Nasopharyngeal Swab; Nasopharyngeal(NP) swabs in vial transport medium  Result Value Ref Range Status   SARS Coronavirus 2 by RT PCR NEGATIVE NEGATIVE Final    Comment: (NOTE) SARS-CoV-2 target nucleic acids are NOT DETECTED.  The SARS-CoV-2 RNA is generally detectable in upper respiratory specimens during the acute phase of infection. The lowest concentration of SARS-CoV-2 viral copies this assay can detect is 138 copies/mL. A negative  result does not preclude SARS-Cov-2 infection and should not be used as the sole basis for treatment or other patient management decisions. A negative result may occur with  improper specimen collection/handling, submission of specimen other than nasopharyngeal swab, presence of viral mutation(s) within the areas targeted by this assay, and inadequate number of viral copies(<138 copies/mL). A negative result must be combined with clinical observations, patient history, and epidemiological information. The expected result is Negative.  Fact Sheet for Patients:  EntrepreneurPulse.com.au  Fact Sheet for Healthcare Providers:   IncredibleEmployment.be  This test is no t yet approved or cleared by the [place] FDA and  has been authorized for detection and/or diagnosis of SARS-CoV-2 by FDA under an Emergency Use Authorization (EUA). This EUA will remain  in effect (meaning this test can be used) for the duration of the COVID-19 declaration under Section 564(b)(1) of the Act, 21 U.S.C.section 360bbb-3(b)(1), unless the authorization is terminated  or revoked sooner.       Influenza A by PCR NEGATIVE NEGATIVE Final   Influenza B by PCR NEGATIVE NEGATIVE Final    Comment: (NOTE) The Xpert Xpress SARS-CoV-2/FLU/RSV plus assay is intended as an aid in the diagnosis of influenza from Nasopharyngeal swab specimens and should not be used as a sole basis for treatment. Nasal washings and aspirates are unacceptable for Xpert Xpress SARS-CoV-2/FLU/RSV testing.  Fact Sheet for Patients: EntrepreneurPulse.com.au  Fact Sheet for Healthcare Providers: IncredibleEmployment.be  This test is not yet approved or cleared by the [place] FDA and has been authorized for detection and/or diagnosis of SARS-CoV-2 by FDA under an Emergency Use Authorization (EUA). This EUA will remain in effect (meaning this test can be used) for the duration of the COVID-19 declaration under Section 564(b)(1) of the Act, 21 U.S.C. section 360bbb-3(b)(1), unless the authorization is terminated or revoked.  Performed at [facility], [address] [place],  [address]     Coagulation Studies: Recent Labs    01/25/20 0704 01/26/20 0409 01/27/20 0526  LABPROT 13.8 14.1 14.3  INR 1.1 1.1 1.2    Urinalysis: No results for input(s): COLORURINE, LABSPEC, PHURINE, GLUCOSEU, HGBUR, BILIRUBINUR, KETONESUR, PROTEINUR, UROBILINOGEN, NITRITE, LEUKOCYTESUR in the last 72 hours.  Invalid input(s): APPERANCEUR    Imaging: PERIPHERAL VASCULAR  CATHETERIZATION  Result Date: 01/25/2020 See OP Note    Medications:   . sodium chloride    . sodium chloride    . heparin 1,700 Units/hr (01/27/20 1008)   . amiodarone  200 mg Oral Daily  . atorvastatin  40 mg Oral Daily  . Chlorhexidine Gluconate Cloth  6 each Topical Q0600  . DULoxetine  30 mg Oral Daily  . insulin glargine  45 Units Subcutaneous QHS  . levothyroxine  88 mcg Oral QAC breakfast  . losartan  25 mg Oral Daily  . midodrine  10 mg Oral Q M,W,F-HD  . [START ON 01/28/2020] polyethylene glycol  51 g Oral Daily  . senna-docusate  1 tablet Oral QHS  . Vitamin D (Ergocalciferol)  50,000 Units Oral Q Tue  . Warfarin - Pharmacist Dosing Inpatient   Does not apply q1600   sodium chloride, sodium chloride, alteplase, heparin, lidocaine (PF), lidocaine-prilocaine, ondansetron **OR** ondansetron (ZOFRAN) IV, oxyCODONE-acetaminophen **AND** oxyCODONE, pentafluoroprop-tetrafluoroeth  Assessment/ Plan:  [name] is a 69 y.o.  female with medical problems including Diabetes Type 2, HF with EF of 45%, ESRD on HD MWF, presented to the ED on 01/22/2020 for SOB.  #ESRD on HD MWF #Volume overload  [facility] [place],MWF, LUA AVF  Patient patient received dialysis treatment yesterday but treatment got interrupted after receiving 1hour and 40 minutes of treatment, due to increased venous pressure and swelling about the cannulation site Volume and electrolyte status acceptable No acute indication for additional dialysis today We will plan for dialysis again tomorrow   # Hyperkalemia Potassium stays stable, 4.9 today  #Anemia of CKD Lab Results  Component Value Date   HGB 9.3 (L) 01/27/2020  We will continue monitoring CBCs   #Secondary Hyperparathyroidism Lab Results  Component Value Date   PTH 173 (H) 01/24/2020   CALCIUM 8.1 (L) 01/27/2020   CAION 1.14 (L) 01/29/2019   PHOS 5.7 (H) 01/27/2020    #Diabetes Type 2 with CKD Lab Results  Component Value Date    HGBA1C 6.8 (H) 09/07/2019   Hypoglycemia noted this morning, encourage PO intake   LOS: 3 Nasier Thumm 12/9/202112:41 PM

## 2020-01-27 NOTE — Evaluation (Signed)
Occupational Therapy Evaluation Patient Details Name: [name] MRN: [id number] DOB: 1950-03-08 Today's Date: 01/27/2020    History of Present Illness 69 y.o. female with medical history significant for insulin-dependent diabetes mellitus type 2, heart failure with reduced ejection fraction, end-stage renal disease on hemodialysis, obstructive sleep apnea not wearing CPAP, hypertension, aortic atherosclerosis, persistent atrial fibrillation, status post TAVR, morbid obesity, acquired hypothyroid, history of focal segmental glomerulosclerosis, presented to the emergency department via EMS for chief concerns of shortness of breath.   Clinical Impression   Patient presenting with decreased I in self care, functional mobility/transfers, endurance, and safety awareness. Pt very pleasant and agreeable to OT intervention. Pt on 5 L O2 via [place] this session and reports being on 2-5 L at baseline. Patient lives with family PTA and reports being mod I for self care tasks with use of rollator in home. She does endorse using SPC in community with assistance for safety. Patient currently functioning at S - min A overall for safety. Pt O2 saturation remaining above 96% with HR increasing to 120's with standing tasks. Pt reports feeling very weak in B LEs and anxious about mobility.  Patient will benefit from acute OT to increase overall independence in the areas of ADLs, functional mobility, and safety awareness in order to safely discharge home with assistance.    Follow Up Recommendations  Home health OT;Supervision/Assistance - 24 hour    Equipment Recommendations  3 in 1 bedside commode       Precautions / Restrictions Precautions Precautions: Fall      Mobility Bed Mobility Overal bed mobility: Needs Assistance Bed Mobility: Rolling;Supine to Sit;Sit to Supine Rolling: Supervision   Supine to sit: Supervision Sit to supine: Supervision        Transfers Overall transfer level: Needs  assistance Equipment used: None Transfers: Sit to/from [facility] Sit to Stand: Min guard Stand pivot transfers: Min guard       General transfer comment: min cuing for hand placement and technique. Pt reports feeling very weak with standing.    Balance Overall balance assessment: Needs assistance Sitting-balance support: Feet supported Sitting balance-Leahy Scale: Good     Standing balance support: During functional activity Standing balance-Leahy Scale: Fair Standing balance comment: use of RW for B UE support          ADL either performed or assessed with clinical judgement   ADL Overall ADL's : Needs assistance/impaired     Grooming: Wash/dry hands;Wash/dry face;Oral care;Set up;Sitting        Lower Body Dressing: Set up;Supervision/safety;Bed level;Sitting/lateral leans        Functional mobility during ADLs: Min guard;Rolling walker       Vision Baseline Vision/History: Wears glasses Wears Glasses: At all times Patient Visual Report: No change from baseline              Pertinent Vitals/Pain Pain Assessment: No/denies pain     Hand Dominance Right   Extremity/Trunk Assessment Upper Extremity Assessment Upper Extremity Assessment: Generalized weakness   Lower Extremity Assessment Lower Extremity Assessment: Generalized weakness   Cervical / Trunk Assessment Cervical / Trunk Assessment: Normal   Communication Communication Communication: No difficulties   Cognition Arousal/Alertness: Awake/alert Behavior During Therapy: WFL for tasks assessed/performed Overall Cognitive Status: Within Functional Limits for tasks assessed                     Home Living Family/patient expects to be discharged to:: Private residence Living Arrangements: Children (daughter) Available  Help at Discharge: Family Type of Home: House Home Access: Stairs to enter [employer] of Steps: 1   Home Layout: One level      Bathroom Shower/Tub: Teacher, early years/pre: Standard     Home Equipment: Environmental consultant - 4 wheels;Cane - single point;Shower seat;Transport chair   Additional Comments: Pt lives with daughter who performs all IADLs      Prior Functioning/Environment Level of Independence: Independent with assistive device(s)        Comments: Pt uses RW at baseline and no O2. Assist from family for IADLs, pt reports sitting for I/ADLs PRN        OT Problem List: Decreased strength;Decreased activity tolerance;Impaired balance (sitting and/or standing);Decreased safety awareness;Cardiopulmonary status limiting activity;Decreased coordination;Decreased knowledge of use of DME or AE      OT Treatment/Interventions: Self-care/ADL training;Therapeutic exercise;Balance training;Neuromuscular education;Therapeutic activities;Energy conservation;Cognitive remediation/compensation;DME and/or AE instruction;Patient/family education    OT Goals(Current goals can be found in the care plan section) Acute Rehab OT Goals Patient Stated Goal: to go home OT Goal Formulation: With patient Time For Goal Achievement: 02/10/20 Potential to Achieve Goals: Good  OT Frequency: Min 2X/week   Barriers to D/C:    none at this time          AM-PAC OT "6 Clicks" Daily Activity     Outcome Measure Help from another person eating meals?: None Help from another person taking care of personal grooming?: A Little Help from another person toileting, which includes using toliet, bedpan, or urinal?: A Lot Help from another person bathing (including washing, rinsing, drying)?: A Lot Help from another person to put on and taking off regular upper body clothing?: A Little Help from another person to put on and taking off regular lower body clothing?: A Lot 6 Click Score: 16   End of Session Equipment Utilized During Treatment: Rolling walker;Oxygen (5 L via [employer]) Nurse Communication: Mobility status  Activity  Tolerance: Patient tolerated treatment well Patient left: in bed;with call bell/phone within reach;with bed alarm set  OT Visit Diagnosis: Unsteadiness on feet (R26.81);Muscle weakness (generalized) (M62.81)                Time: 6761-9509 OT Time Calculation (min): 22 min Charges:  OT General Charges $OT Visit: 1 Visit OT Treatments $Self Care/Home Management : 8-22 mins  Darleen Crocker, MS, OTR/L , CBIS ascom [phone]  01/27/20, 3:29 PM

## 2020-01-27 NOTE — Progress Notes (Signed)
OT Cancellation Note  Patient Details Name: [name] MRN: [id number] DOB: October 16, 1950   Cancelled Treatment:    Reason Eval/Treat Not Completed: Other (comment). OT order received and chart reviewed. Pt with MD in room at this time. OT will follow up as time allows and pt is available.   Darleen Crocker, MS, OTR/L , CBIS ascom [phone]  01/27/20, 10:01 AM   01/27/2020, 10:00 AM

## 2020-01-27 NOTE — Progress Notes (Addendum)
PROGRESS NOTE    CLANCY LEINER  GLO:[id number] DOB: Nov 03, 1950 DOA: 01/23/2020 PCP: Adin Hector, MD   Brief Narrative: Taken from H&P  [name] is a 69 y.o. female with medical history significant for insulin-dependent diabetes mellitus type 2, heart failure with reduced ejection fraction, end-stage renal disease on hemodialysis, obstructive sleep apnea not wearing CPAP, hypertension, aortic atherosclerosis, persistent atrial fibrillation, status post TAVR, morbid obesity, acquired hypothyroid, history of focal segmental glomerulosclerosis, presented to the emergency department via EMS for chief concerns of shortness of breath.   Shortness of breath that started progressively worsened throughout the day 01/22/20. Daughter denies fever, nausea, vomiting, chest pain, abdominal pain, dysuria, hematuria, diarrhea, blood in her stool. Patient and daughter states that she used her home oxygen without much relief.   Family endorses new increased productive cough with yellow mucus throughout the day. Endorses increased swelling in her legs.   Patient has frequent recent hospitalizations at [facility] with similar symptoms.  Also found to have reduced EF to 25% on repeat echo and a valvular thrombus.  Her Eliquis was transitioned to warfarin.  Also recent GI bleed with aspirin and warfarin, initially discontinue both and then later started on warfarin only. She was also recommended to have dialysis 4 times a week to prevent volume overload.  Hardly makes any urine, volume is being managed with dialysis.  Cardiology was consulted.  Subjective: Continues w/ dyspnea but up in bed, off non-rebreather. Hasn't required bipap since evening of 12/6. Had dialysis yesterday. Fatigue continues.  Assessment & Plan:   Principal Problem:   Acute hypoxemic respiratory failure (HCC) Active Problems:   Obesity   Anemia   Benign essential HTN   Acquired atrophy of thyroid   FSGS (focal segmental  glomerulosclerosis)   Acute kidney injury superimposed on chronic kidney disease (HCC)   S/P TAVR (transcatheter aortic valve replacement)   DM type 2 with diabetic peripheral neuropathy (HCC)   Acute respiratory failure with hypoxia (HCC)   DDD (degenerative disc disease), lumbosacral   OSA (obstructive sleep apnea)  Acute on chronic hypoxic respiratory failure.  Most likely cardiogenic pulmonary edema/volume overload secondary to systolic CHF exacerbation, complicated by ESRD. Makes minimal urine. Dialyzed 12/7 after angioplasty of brachiocephalic fiscula by IR, and again on 12/8.  Baseline 3 L [place] O2, currently on 6 L. Recent echocardiogram with worsening of her EF to 25%, she also had right and left cardiac catheterization recently done at [facility]. -Volume is being managed with dialysis -Might need more frequent dialysis to prevent recurrent hospitalizations with similar symptoms. -Continue with supportive care.  HFrEF  Most recent EF 20%. -Cardiology was consulted-appreciate their recommendations -defer institution of evidence-based meds to cardiology - volume mgmt via dialysis  ESRD on HD(MWF).  Per patient she is quite compliant with her dialysis. 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 -last dialysis 12/8, plan for repeat tomorrow - will touch base w/ nephrology about need for further mgmt of fistula  Persistent A. fib.  She was previously on Eliquis which was transitioned to warfarin due to history of aortic valve thrombosis. Rate controlled this morning in the 90s -Continue with amiodarone -Continue warfarin per pharmacy, on heparin until INR therapeutic (watch platelets while on heparin, 147 today)  Hyperkalemia 4.9 today - start lokelma on non-dialysis days, first dose today  History of chronic pain. -Continue home oxycodone  Aortic stenosis s/p TAVR.  History of recent aortic  valve thrombosis. -Continue  anticoagulation as above  Insulin-dependent diabetes mellitus. Well controlled, a1c 6.8 08/2019 -Continue SSI, decrease home lantus to 35 given hypoglycemia this morning  Obstructive sleep apnea.  She is unable to tolerate auto CPAP. She will need outpatient pulmonology referral for further recommendations.  Hypothyroidism. -Continue home Synthroid.  Constipation - Standing miralax/senna, will incrase miralax dose to 54 daily (on 34 daily at home, hasn't stooled in a few days)  Debility - PT/OT consulted  Morbid obesity. Body mass index is 41.71 kg/m.  This will complicate overall prognosis.  Objective: Vitals:   01/26/20 2056 01/27/20 0456 01/27/20 0500 01/27/20 0816  BP: 102/71 (!) 98/57  109/68  Pulse: 82 95  96  Resp:  18  18  Temp: 98.5 F (36.9 C) (!) 97.3 F (36.3 C)  98.3 F (36.8 C)  TempSrc: Oral Oral  Oral  SpO2: 100% 100%  99%  Weight:   106.8 kg   Height:        Intake/Output Summary (Last 24 hours) at 01/27/2020 1005 Last data filed at 01/26/2020 2322 Gross per 24 hour  Intake 353.51 ml  Output 0 ml  Net 353.51 ml   Filed Weights   01/25/20 0420 01/26/20 0305 01/27/20 0500  Weight: 107.6 kg 106.9 kg 106.8 kg    Examination:  General.  Morbidly obese lady, mildly distressed. Pulmonary.  Bilateral basal crackles, no increased wob CV.  Regular rate and rhythm, no JVD, rub or murmur. Abdomen.  Soft, nontender, nondistended, BS positive. Morbidly obese CNS.  Alert and oriented x3.  No focal neurologic deficit. Extremities.  Trace LE edema, no cyanosis, pulses intact and symmetrical. Psychiatry.  Judgment and insight appears normal.  DVT prophylaxis: Coumadin/heparin Code Status: Full Family Communication: Discussed with patient Disposition Plan:  Status is: Inpatient  Remains inpatient appropriate because:Inpatient level of care appropriate due to severity of illness   Dispo: The patient is from: Home              Anticipated d/c is to:  Home (patient declines SNF) with home health              Anticipated d/c date is: 2-5 days              Patient currently is not medically stable to d/c.   Consultants:   Cardiology  Nephrology  Procedures:  Antimicrobials:   Data Reviewed: I have personally reviewed following labs and imaging studies  CBC: Recent Labs  Lab 01/23/20 1143 01/24/20 0626 01/25/20 0704 01/26/20 0409 01/27/20 0829  WBC 9.6 10.2 10.6* 10.0 7.1  HGB 11.3* 10.7* 10.9* 9.9* 9.3*  HCT 37.6 37.2 36.8 34.0* 32.4*  MCV 98.7 100.8* 100.5* 99.7 100.3*  PLT 193 197 182 156 588*   Basic Metabolic Panel: Recent Labs  Lab 01/23/20 1143 01/24/20 0626 01/26/20 1121 01/27/20 0526  NA 135 136 131* 133*  K 4.5 5.6* 5.3* 4.9  CL 95* 97* 92* 93*  CO2 28 28 26 27   GLUCOSE 214* 170* 132* 74  BUN 41* 28* 40* 39*  CREATININE 6.26* 4.90* 5.66* 5.38*  CALCIUM 8.8* 9.2 8.2* 8.2*  PHOS  --  5.4*  --  5.0*   GFR: Estimated Creatinine Clearance: 11.6 mL/min (A) (by C-G formula based on SCr of 5.38 mg/dL (H)). Liver Function Tests: Recent Labs  Lab 01/23/20 1143  AST 22  ALT 18  ALKPHOS 58  BILITOT 0.9  PROT 7.5  ALBUMIN 3.8   No results for input(s): LIPASE,  AMYLASE in the last 168 hours. No results for input(s): AMMONIA in the last 168 hours. Coagulation Profile: Recent Labs  Lab 01/23/20 1142 01/24/20 0626 01/25/20 0704 01/26/20 0409 01/27/20 0526  INR 1.1 1.1 1.1 1.1 1.2   Cardiac Enzymes: No results for input(s): CKTOTAL, CKMB, CKMBINDEX, TROPONINI in the last 168 hours. BNP (last 3 results) No results for input(s): PROBNP in the last 8760 hours. HbA1C: No results for input(s): HGBA1C in the last 72 hours. CBG: Recent Labs  Lab 01/24/20 1803 01/24/20 2057 01/25/20 1144 01/25/20 2149 01/26/20 2144  GLUCAP 121* 149* 202* 136* 125*   Lipid Profile: No results for input(s): CHOL, HDL, LDLCALC, TRIG, CHOLHDL, LDLDIRECT in the last 72 hours. Thyroid Function Tests: No results for  input(s): TSH, T4TOTAL, FREET4, T3FREE, THYROIDAB in the last 72 hours. Anemia Panel: No results for input(s): VITAMINB12, FOLATE, FERRITIN, TIBC, IRON, RETICCTPCT in the last 72 hours. Sepsis Labs: No results for input(s): PROCALCITON, LATICACIDVEN in the last 168 hours.  Recent Results (from the past 240 hour(s))  Resp Panel by RT-PCR (Flu A&B, Covid) Nasopharyngeal Swab     Status: None   Collection Time: 01/23/20 11:43 AM   Specimen: Nasopharyngeal Swab; Nasopharyngeal(NP) swabs in vial transport medium  Result Value Ref Range Status   SARS Coronavirus 2 by RT PCR NEGATIVE NEGATIVE Final    Comment: (NOTE) SARS-CoV-2 target nucleic acids are NOT DETECTED.  The SARS-CoV-2 RNA is generally detectable in upper respiratory specimens during the acute phase of infection. The lowest concentration of SARS-CoV-2 viral copies this assay can detect is 138 copies/mL. A negative result does not preclude SARS-Cov-2 infection and should not be used as the sole basis for treatment or other patient management decisions. A negative result may occur with  improper specimen collection/handling, submission of specimen other than nasopharyngeal swab, presence of viral mutation(s) within the areas targeted by this assay, and inadequate number of viral copies(<138 copies/mL). A negative result must be combined with clinical observations, patient history, and epidemiological information. The expected result is Negative.  Fact Sheet for Patients:  EntrepreneurPulse.com.au  Fact Sheet for Healthcare Providers:  IncredibleEmployment.be  This test is no t yet approved or cleared by the [place] FDA and  has been authorized for detection and/or diagnosis of SARS-CoV-2 by FDA under an Emergency Use Authorization (EUA). This EUA will remain  in effect (meaning this test can be used) for the duration of the COVID-19 declaration under Section 564(b)(1) of the Act,  21 U.S.C.section 360bbb-3(b)(1), unless the authorization is terminated  or revoked sooner.       Influenza A by PCR NEGATIVE NEGATIVE Final   Influenza B by PCR NEGATIVE NEGATIVE Final    Comment: (NOTE) The Xpert Xpress SARS-CoV-2/FLU/RSV plus assay is intended as an aid in the diagnosis of influenza from Nasopharyngeal swab specimens and should not be used as a sole basis for treatment. Nasal washings and aspirates are unacceptable for Xpert Xpress SARS-CoV-2/FLU/RSV testing.  Fact Sheet for Patients: EntrepreneurPulse.com.au  Fact Sheet for Healthcare Providers: IncredibleEmployment.be  This test is not yet approved or cleared by the [place] FDA and has been authorized for detection and/or diagnosis of SARS-CoV-2 by FDA under an Emergency Use Authorization (EUA). This EUA will remain in effect (meaning this test can be used) for the duration of the COVID-19 declaration under Section 564(b)(1) of the Act, 21 U.S.C. section 360bbb-3(b)(1), unless the authorization is terminated or revoked.  Performed at [facility], [place], [place]  [address]      Radiology Studies: PERIPHERAL VASCULAR CATHETERIZATION  Result Date: 01/25/2020 See OP Note   Scheduled Meds: . amiodarone  200 mg Oral Daily  . atorvastatin  40 mg Oral Daily  . Chlorhexidine Gluconate Cloth  6 each Topical Q0600  . DULoxetine  30 mg Oral Daily  . insulin glargine  45 Units Subcutaneous QHS  . levothyroxine  88 mcg Oral QAC breakfast  . midodrine  10 mg Oral Q M,W,F-HD  . [START ON 01/28/2020] polyethylene glycol  51 g Oral Daily  . senna-docusate  1 tablet Oral QHS  . sodium zirconium cyclosilicate  5 g Oral Once  . Vitamin D (Ergocalciferol)  50,000 Units Oral Q Tue  . warfarin  5 mg Oral Once  . Warfarin - Pharmacist Dosing Inpatient   Does not apply q1600   Continuous Infusions: . sodium chloride    . sodium chloride    .  heparin 1,700 Units/hr (01/27/20 0038)     LOS: 3 days   Time spent: 35 minutes.  Desma Maxim, MD [facility]  If 7PM-7AM, please contact night-coverage [email or web address]  01/27/2020, 10:05 AM

## 2020-01-27 NOTE — Evaluation (Signed)
Physical Therapy Evaluation Patient Details Name: [name] MRN: [id number] DOB: [birth year] Today's Date: 01/27/2020   History of Present Illness  Pt is a 69 y.o. female with medical history significant for insulin-dependent diabetes mellitus type 2, heart failure with reduced ejection fraction, end-stage renal disease on hemodialysis, obstructive sleep apnea not wearing CPAP, hypertension, aortic atherosclerosis, persistent atrial fibrillation, status post TAVR, morbid obesity, acquired hypothyroid, history of focal segmental glomerulosclerosis, presented to the emergency department via EMS for chief concerns of shortness of breath.  MD assessment includes: Acute on chronic hypoxic respiratory failure, hyperkalemia, and debility.    Clinical Impression  Pt was pleasant and motivated to participate during the session.  Pt found on 5LO2/min with SpO2 97-100% during graded therex and bed mobility.  Nsg notified and agreed to trial on 3LO2/min which is pt's baseline.  Pt's SpO2 measured frequently throughout the remainder of the session on 3LO2/min with SpO2 remaining >/= 97% throughout, pt left on 3LO2/min at end of session with nsg notified.  Pt demonstrated good eccentric and concentric control and stability during multiple sit to/from stand transfers from three different surfaces with only min cues for hand placement needed.  Pt ambulated with good stability but distance was limited to around 12' secondary to pt having to have a BM.  Pt will benefit from HHPT services upon discharge to safely address deficits listed in patient problem list for decreased caregiver assistance and decreased risk of further functional decline.       Follow Up Recommendations Home health PT;Supervision for mobility/OOB    Equipment Recommendations  Rolling walker with 5" wheels    Recommendations for Other Services       Precautions / Restrictions Precautions Precautions: Fall Restrictions Weight Bearing  Restrictions: No      Mobility  Bed Mobility Overal bed mobility: Modified Independent Bed Mobility: Rolling;Supine to Sit;Sit to Supine Rolling: Supervision   Supine to sit: Supervision Sit to supine: Supervision   General bed mobility comments: Min extra effort with bed mobility tasks but no physical assist required    Transfers Overall transfer level: Needs assistance Equipment used: Rolling walker (2 wheeled) Transfers: Sit to/from Stand Sit to Stand: Min guard Stand pivot transfers: Min guard       General transfer comment: Min verbal cues for hand placement but good eccentric and concentric control and stability  Ambulation/Gait   Gait Distance (Feet): 12 Feet Assistive device: Rolling walker (2 wheeled) Gait Pattern/deviations: Step-through pattern;Decreased step length - right;Decreased step length - left Gait velocity: decreased   General Gait Details: Min verbal cues for amb closer to the RW with upright posture with good stability and distance limited by pt needing to have a BM  Stairs            Wheelchair Mobility    Modified Rankin (Stroke Patients Only)       Balance Overall balance assessment: Needs assistance Sitting-balance support: Feet supported Sitting balance-Leahy Scale: Good     Standing balance support: During functional activity;Bilateral upper extremity supported Standing balance-Leahy Scale: Good Standing balance comment: use of RW for B UE support                             Pertinent Vitals/Pain Pain Assessment: No/denies pain    Home Living Family/patient expects to be discharged to:: Private residence Living Arrangements: Children (daughter) Available Help at Discharge: Family;Available 24 hours/day Type of Home: House Home Access: Stairs  to enter Entrance Stairs-Rails: Right;Left Entrance Stairs-Number of Steps: 4 Home Layout: One level Home Equipment: [place] - 4 wheels;Cane - single point;Shower  seat;Transport chair Additional Comments: Pt lives with daughter who performs all IADLs; dtr works from home and is available 24/7    Prior Function Level of Independence: Independent with assistive device(s)         Comments: Mod Ind amb mostly household distances with a 4WW, no fall history, uses w/c for community access     Hand Dominance   Dominant Hand: Right    Extremity/Trunk Assessment   Upper Extremity Assessment Upper Extremity Assessment: Generalized weakness    Lower Extremity Assessment Lower Extremity Assessment: Generalized weakness    Cervical / Trunk Assessment Cervical / Trunk Assessment: Normal  Communication   Communication: No difficulties  Cognition Arousal/Alertness: Awake/alert Behavior During Therapy: WFL for tasks assessed/performed Overall Cognitive Status: Within Functional Limits for tasks assessed                                        General Comments      Exercises Total Joint Exercises Ankle Circles/Pumps: Strengthening;Both;5 reps;10 reps (with resistance) Quad Sets: Strengthening;Both;10 reps Gluteal Sets: Strengthening;Both;10 reps Heel Slides: AROM;Strengthening;Both;10 reps Hip ABduction/ADduction: Strengthening;Both;10 reps Long Arc Quad: AROM;Strengthening;Both;10 reps;15 reps Knee Flexion: AROM;Strengthening;Both;10 reps;15 reps   Assessment/Plan    PT Assessment Patient needs continued PT services  PT Problem List Decreased strength;Decreased activity tolerance;Decreased balance;Decreased mobility       PT Treatment Interventions DME instruction;Gait training;Stair training;Functional mobility training;Therapeutic activities;Therapeutic exercise;Balance training;Patient/family education    PT Goals (Current goals can be found in the Care Plan section)  Acute Rehab PT Goals Patient Stated Goal: To get back home PT Goal Formulation: With patient Time For Goal Achievement: 02/09/20 Potential to  Achieve Goals: Good    Frequency Min 2X/week   Barriers to discharge        Co-evaluation               AM-PAC PT "6 Clicks" Mobility  Outcome Measure Help needed turning from your back to your side while in a flat bed without using bedrails?: None Help needed moving from lying on your back to sitting on the side of a flat bed without using bedrails?: None Help needed moving to and from a bed to a chair (including a wheelchair)?: A Little Help needed standing up from a chair using your arms (e.g., wheelchair or bedside chair)?: A Little Help needed to walk in hospital room?: A Little Help needed climbing 3-5 steps with a railing? : A Little 6 Click Score: 20    End of Session Equipment Utilized During Treatment: Gait belt;Oxygen Activity Tolerance: Patient tolerated treatment well Patient left: with call bell/phone within reach;Other (comment) (Pt left on BSC attempting BM with both CNA and nurse notified) Nurse Communication: Mobility status;Other (comment) (Pt left on 3LO2/min at end of session, SpO2 results with activity) PT Visit Diagnosis: Muscle weakness (generalized) (M62.81);Difficulty in walking, not elsewhere classified (R26.2)    Time: 6144-3154 PT Time Calculation (min) (ACUTE ONLY): 28 min   Charges:   PT Evaluation $PT Eval Moderate Complexity: 1 Mod PT Treatments $Therapeutic Exercise: 8-22 mins       D. Royetta Asal PT, DPT 01/27/20, 5:28 PM

## 2020-01-27 NOTE — Progress Notes (Signed)
Inpatient Diabetes Program Recommendations  AACE/ADA: New Consensus Statement on Inpatient Glycemic Control (2015)  Target Ranges:  Prepandial:   less than 140 mg/dL      Peak postprandial:   less than 180 mg/dL (1-2 hours)      Critically ill patients:  140 - 180 mg/dL   Lab Results  Component Value Date   GLUCAP 86 01/27/2020   HGBA1C 6.8 (H) 09/07/2019    Review of Glycemic Control Results for [name], [name] (MRN [id number]) as of 01/27/2020 14:37  Ref. Range 01/24/2020 20:57 01/25/2020 11:44 01/25/2020 21:49 01/26/2020 21:44 01/27/2020 14:29  Glucose-Capillary Latest Ref Range: 70 - 99 mg/dL 149 (H) 202 (H) 136 (H) 125 (H) 86   Diabetes history: DM 2 Outpatient Diabetes medications:  Humalog 5 units tid with meals, Lantus 5 units q HS?? Current orders for Inpatient glycemic control:  Lantus 45 units q HS  Inpatient Diabetes Program Recommendations:   Lab glucose=59 mg/dL.  Please reduce Lantus to 35 units q HS.  Also please add Novolog very sensitive (0-6 units) tid with meals and HS.   Thanks  Adah Perl, RN, BC-ADM Inpatient Diabetes Coordinator Pager [phone] (8a-5p)

## 2020-01-27 NOTE — Consult Note (Signed)
[place] for Warfarin/Heparin Indication: afib s/p TAVR.   Allergies  Allergen Reactions  . Buspirone Other (See Comments)    Weakness  . Diltiazem Hcl Palpitations  . Gabapentin Palpitations  . Hydralazine Rash  . Lisinopril Cough  . Lovastatin Palpitations  . Metformin Diarrhea  . Pravastatin Other (See Comments)    Insomnia  . Sitagliptin Other (See Comments)    Constipation  . Tramadol Itching   Patient Measurements: Height: 5\' 3"  (160 cm) Weight: 106.9 kg (235 lb 11.2 oz) IBW/kg (Calculated) : 52.4  Vital Signs: Temp: 98.5 F (36.9 C) (12/08 2056) Temp Source: Oral (12/08 2056) BP: 102/71 (12/08 2056) Pulse Rate: 82 (12/08 2056)  Labs: Recent Labs    01/24/20 0626 01/24/20 2255 01/25/20 0704 01/25/20 0704 01/26/20 0409 01/26/20 1121 01/26/20 1356 01/26/20 2248  HGB 10.7*  --  10.9*  --  9.9*  --   --   --   HCT 37.2  --  36.8  --  34.0*  --   --   --   PLT 197  --  182  --  156  --   --   --   LABPROT 13.9  --  13.8  --  14.1  --   --   --   INR 1.1  --  1.1  --  1.1  --   --   --   HEPARINUNFRC  --    < > 0.22*   < > 0.22*  --  0.41 0.11*  CREATININE 4.90*  --   --   --   --  5.66*  --   --    < > = values in this interval not displayed.   Estimated Creatinine Clearance: 11 mL/min (A) (by C-G formula based on SCr of 5.66 mg/dL (H)).  Medical History: Past Medical History:  Diagnosis Date  . Anemia   . Anxiety   . Aortic valve stenosis   . Arrhythmia    atrial fibrillation  . Arthritis    feet, legs  . B12 deficiency   . Bowel obstruction ([place])   . CHF (congestive heart failure) ([place])   . CKD (chronic kidney disease)    protein in urine  . Colostomy in place [facility])   . Diabetes mellitus without complication ([place])   . Dialysis patient [facility])    T/Th/Sa  . Diverticulitis large intestine   . Dysrhythmia   . Ectopic atrial tachycardia ([place])   . FSGS (focal segmental glomerulosclerosis)   . Gastritis    . GI bleed   . Heart murmur   . Hyperlipidemia   . Hypertension   . Hypothyroidism   . Hypothyroidism    unspecified  . MRSA (methicillin resistant Staphylococcus aureus)    at abdominal wound.  Jan 2017.  Treated.   . Neuropathy   . Neuropathy in diabetes ([place])   . Obesity   . Pedal edema   . Psoriasis   . Shortness of breath dyspnea   . Sleep apnea    CPAP  . Vertigo    Medications:  Warfarin 1.5mg  daily; last dose was 12/4 - pharmacy tech confirmed dose with patient's daughter  Assessment: 69yo female with a past medical history of anemia, anxiety, diabetes, CHF, CKD ESRD (hemodialysis MWF), hypertension, obesity, presents to the emergency department for shortness of breath. Patient was on Eliquis in the past and was switched to warfarin around October 2021. Pharmacy has been consulted for warfarin dosing and  monitoring for afib and s/p TAVR (2019). Patient follows at the [facility]. Hgb 11.3 slightly decreased but appears to be patient's baseline. Spoke with patient's daughter - she stated patient was previously on warfarin 2mg  and was changed to 1mg  around 12/19/19 due to having darker stools, then patient was recently changed to warfarin 1.5mg  12/2. CHADSVASc of 5. Plan to bridge warfarin with heparin gtt.   DDI: amiodarone and levothyroxine    12/6 2255 HL 0.14  increase Heparin to 1200 units/hr  12/7 0704 HL 0.22 1000 unit bolus and increase heparin infusion to 1350 units/hr.  12/8 0409 HL 0.22 1000 unit bolus and increase heparin infusion to 1550 units/hr 12/8 1356 HL 0.41  Date INR Dose 12/5 1.1 Not given 12/6  1.1 3 mg  12/7 1.1 3 mg - not given.  12/8 1.1 5 mg   Goal of Therapy:  INR 2-3 for warfarin  Heparin level 0.3 - 0.7.  Monitor platelets by anticoagulation protocol: Yes   Plan:  Heparin:   Heparin level is SUBtherapeutic. Will increase heparin infusion to 1700 units/hr. Recheck heparin level in 8 hours. CBC daily while on heparin.    Warfarin:  INR subtherapeutic. Will give warfarin 5mg  tonight x 1. Daily INR ordered.    Ena Dawley, PharmD 01/27/2020 12:12 AM

## 2020-01-27 NOTE — Consult Note (Signed)
[place] for Warfarin/Heparin Indication: afib s/p TAVR.   Allergies  Allergen Reactions  . Buspirone Other (See Comments)    Weakness  . Diltiazem Hcl Palpitations  . Gabapentin Palpitations  . Hydralazine Rash  . Lisinopril Cough  . Lovastatin Palpitations  . Metformin Diarrhea  . Pravastatin Other (See Comments)    Insomnia  . Sitagliptin Other (See Comments)    Constipation  . Tramadol Itching   Patient Measurements: Height: 5\' 3"  (160 cm) Weight: 106.8 kg (235 lb 7.2 oz) IBW/kg (Calculated) : 52.4  Vital Signs: Temp: 98.3 F (36.8 C) (12/09 0816) Temp Source: Oral (12/09 0816) BP: 109/68 (12/09 0816) Pulse Rate: 96 (12/09 0816)  Labs: Recent Labs    01/25/20 0704 01/26/20 0409 01/26/20 1121 01/26/20 1356 01/26/20 2248 01/27/20 0526 01/27/20 0829  HGB 10.9* 9.9*  --   --   --   --  9.3*  HCT 36.8 34.0*  --   --   --   --  32.4*  PLT 182 156  --   --   --   --  147*  LABPROT 13.8 14.1  --   --   --  14.3  --   INR 1.1 1.1  --   --   --  1.2  --   HEPARINUNFRC 0.22* 0.22*  --  0.41 0.11*  --  0.52  CREATININE  --   --  5.66*  --   --  5.38*  --    Estimated Creatinine Clearance: 11.6 mL/min (A) (by C-G formula based on SCr of 5.38 mg/dL (H)).  Medical History: Past Medical History:  Diagnosis Date  . Anemia   . Anxiety   . Aortic valve stenosis   . Arrhythmia    atrial fibrillation  . Arthritis    feet, legs  . B12 deficiency   . Bowel obstruction ([place])   . CHF (congestive heart failure) ([place])   . CKD (chronic kidney disease)    protein in urine  . Colostomy in place [facility])   . Diabetes mellitus without complication ([place])   . Dialysis patient [facility])    T/Th/Sa  . Diverticulitis large intestine   . Dysrhythmia   . 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 FSGS (focal segmental glomerulosclerosis)   . Gastritis   . GI bleed   . Heart murmur   . Hyperlipidemia   . Hypertension   . Hypothyroidism   .  Hypothyroidism    unspecified  . MRSA (methicillin resistant Staphylococcus aureus)    at abdominal wound.  Jan 2017.  Treated.   . Neuropathy   . Neuropathy in diabetes ([place])   . Obesity   . Pedal edema   . Psoriasis   . Shortness of breath dyspnea   . Sleep apnea    CPAP  . Vertigo    Medications:  Warfarin 1.5mg  daily; last dose was 12/4 - pharmacy tech confirmed dose with patient's daughter  Assessment: 69yo female with a past medical history of anemia, anxiety, diabetes, CHF, CKD ESRD (hemodialysis MWF), hypertension, obesity, presents to the emergency department for shortness of breath. Patient was on Eliquis in the past and was switched to warfarin around October 2021. Pharmacy has been consulted for warfarin dosing and monitoring for afib and s/p TAVR (2019). Patient follows at the [facility]. Hgb 11.3 slightly decreased but appears to be patient's baseline. Spoke with patient's daughter - she stated patient was previously on warfarin 2mg  and was  changed to 1mg  around 12/19/19 due to having darker stools, then patient was recently changed to warfarin 1.5mg  12/2. CHADSVASc of 5. Plan to bridge warfarin with heparin gtt.   DDI: amiodarone and levothyroxine    12/6 2255 HL 0.14  increase Heparin to 1200 units/hr  12/7 0704 HL 0.22 1000 unit bolus and increase heparin infusion to 1350 units/hr.  12/8 0409 HL 0.22 1000 unit bolus and increase heparin infusion to 1550 units/hr 12/8 1356 HL 0.41 12/8 2248 HL 0.11 increase heparin infusion to 1700 units/hr. Seem to be a lab drawn error.  12/9 0829 HL 0.52   Date INR Dose 12/5 1.1 Not given 12/6  1.1 3 mg  12/7 1.1 3 mg - not charted given.  12/8 1.1 5 mg - not charted given  12/9 1.2 5 mg   Goal of Therapy:  INR 2-3 for warfarin  Heparin level 0.3 - 0.7.  Monitor platelets by anticoagulation protocol: Yes   Plan:  Heparin:   Heparin level is therapeutic. Will continue heparin infusion at 1700 units/hr. Recheck  heparin level in 8 hours. CBC daily while on heparin.   Warfarin:  INR subtherapeutic. Will give warfarin 5mg  tonight x 1. Daily INR ordered.    Oswald Hillock, PharmD 01/27/2020 9:01 AM

## 2020-01-27 NOTE — Progress Notes (Signed)
Hypoglycemic Event  CBG: 51  Treatment: 4oz of gingerale  Symptoms: none  Follow-up CBG: Time:2050 CBG Result:75   Comments/MD notified: Benjamine Mola NP,  Repeat BG*2 and page the results for Lantus dose readjustment

## 2020-01-27 NOTE — Consult Note (Signed)
[place] for Warfarin/Heparin Indication: afib s/p TAVR.   Allergies  Allergen Reactions  . Buspirone Other (See Comments)    Weakness  . Diltiazem Hcl Palpitations  . Gabapentin Palpitations  . Hydralazine Rash  . Lisinopril Cough  . Lovastatin Palpitations  . Metformin Diarrhea  . Pravastatin Other (See Comments)    Insomnia  . Sitagliptin Other (See Comments)    Constipation  . Tramadol Itching   Patient Measurements: Height: 5\' 3"  (160 cm) Weight: 106.8 kg (235 lb 7.2 oz) IBW/kg (Calculated) : 52.4  Vital Signs: Temp: 98 F (36.7 C) (12/09 1718) Temp Source: Oral (12/09 1718) BP: 115/39 (12/09 1718) Pulse Rate: 83 (12/09 1718)  Labs: Recent Labs    01/25/20 0704 01/26/20 0409 01/26/20 1121 01/26/20 1356 01/26/20 2248 01/27/20 0526 01/27/20 0829 01/27/20 1728  HGB 10.9* 9.9*  --   --   --   --  9.3*  --   HCT 36.8 34.0*  --   --   --   --  32.4*  --   PLT 182 156  --   --   --   --  147*  --   LABPROT 13.8 14.1  --   --   --  14.3  --   --   INR 1.1 1.1  --   --   --  1.2  --   --   HEPARINUNFRC 0.22* 0.22*  --    < > 0.11*  --  0.52 0.37  CREATININE  --   --  5.66*  --   --  5.38* 5.75*  --    < > = values in this interval not displayed.   Estimated Creatinine Clearance: 10.8 mL/min (A) (by C-G formula based on SCr of 5.75 mg/dL (H)).  Medical History: Past Medical History:  Diagnosis Date  . Anemia   . Anxiety   . Aortic valve stenosis   . Arrhythmia    atrial fibrillation  . Arthritis    feet, legs  . B12 deficiency   . Bowel obstruction ([place])   . CHF (congestive heart failure) ([place])   . CKD (chronic kidney disease)    protein in urine  . Colostomy in place [facility])   . Diabetes mellitus without complication ([place])   . Dialysis patient [facility])    T/Th/Sa  . Diverticulitis large intestine   . Dysrhythmia   . 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 FSGS (focal segmental glomerulosclerosis)   . Gastritis    . GI bleed   . Heart murmur   . Hyperlipidemia   . Hypertension   . Hypothyroidism   . Hypothyroidism    unspecified  . MRSA (methicillin resistant Staphylococcus aureus)    at abdominal wound.  Jan 2017.  Treated.   . Neuropathy   . Neuropathy in diabetes ([place])   . Obesity   . Pedal edema   . Psoriasis   . Shortness of breath dyspnea   . Sleep apnea    CPAP  . Vertigo    Medications:  Warfarin 1.5mg  daily; last dose was 12/4 - pharmacy tech confirmed dose with patient's daughter  Assessment: 69yo female with a past medical history of anemia, anxiety, diabetes, CHF, CKD ESRD (hemodialysis MWF), hypertension, obesity, presents to the emergency department for shortness of breath. Patient was on Eliquis in the past and was switched to warfarin around October 2021. Pharmacy has been consulted for warfarin dosing and monitoring for afib and s/p  TAVR (2019). Patient follows at the [facility]. Hgb 11.3 slightly decreased but appears to be patient's baseline. Spoke with patient's daughter - she stated patient was previously on warfarin 2mg  and was changed to 1mg  around 12/19/19 due to having darker stools, then patient was recently changed to warfarin 1.5mg  12/2. CHADSVASc of 5. Plan to bridge warfarin with heparin gtt.   DDI: amiodarone and levothyroxine    12/6 2255 HL 0.14  increase Heparin to 1200 units/hr  12/7 0704 HL 0.22 1000 unit bolus and increase heparin infusion to 1350 units/hr.  12/8 0409 HL 0.22 1000 unit bolus and increase heparin infusion to 1550 units/hr 12/8 1356 HL 0.41 12/8 2248 HL 0.11 increase heparin infusion to 1700 units/hr. Seem to be a lab drawn error.  12/9 0829 HL 0.52, therapeutic x 1 12/9 1728 HL 0.37, therapeutic x 2  Date INR Dose 12/5 1.1 Not given 12/6  1.1 3 mg  12/7 1.1 3 mg - not charted given.  12/8 1.1 5 mg - not charted given  12/9 1.2 5 mg   Goal of Therapy:  INR 2-3 for warfarin  Heparin level 0.3 - 0.7.  Monitor platelets by  anticoagulation protocol: Yes   Plan:  Heparin:   Heparin level is therapeutic. Will continue heparin infusion at 1700 units/hr. Recheck heparin level with AM labs. CBC daily while on heparin.   Warfarin:  INR subtherapeutic. Will give warfarin 5mg  tonight x 1. Daily INR ordered.    Paulina Fusi, PharmD, BCPS 01/27/2020 6:20 PM

## 2020-01-28 ENCOUNTER — Inpatient Hospital Stay: Payer: Medicare Other

## 2020-01-28 LAB — CBC
HCT: 32.4 % — ABNORMAL LOW (ref 36.0–46.0)
Hemoglobin: 9.6 g/dL — ABNORMAL LOW (ref 12.0–15.0)
MCH: 29.4 pg (ref 26.0–34.0)
MCHC: 29.6 g/dL — ABNORMAL LOW (ref 30.0–36.0)
MCV: 99.4 fL (ref 80.0–100.0)
Platelets: 167 10*3/uL (ref 150–400)
RBC: 3.26 MIL/uL — ABNORMAL LOW (ref 3.87–5.11)
RDW: 15.4 % (ref 11.5–15.5)
WBC: 7.2 10*3/uL (ref 4.0–10.5)
nRBC: 0.3 % — ABNORMAL HIGH (ref 0.0–0.2)

## 2020-01-28 LAB — BASIC METABOLIC PANEL
Anion gap: 13 (ref 5–15)
BUN: 50 mg/dL — ABNORMAL HIGH (ref 8–23)
CO2: 26 mmol/L (ref 22–32)
Calcium: 8.2 mg/dL — ABNORMAL LOW (ref 8.9–10.3)
Chloride: 90 mmol/L — ABNORMAL LOW (ref 98–111)
Creatinine, Ser: 6.95 mg/dL — ABNORMAL HIGH (ref 0.44–1.00)
GFR, Estimated: 6 mL/min — ABNORMAL LOW (ref >60)
Glucose, Bld: 123 mg/dL — ABNORMAL HIGH (ref 70–99)
Potassium: 4.5 mmol/L (ref 3.5–5.1)
Sodium: 129 mmol/L — ABNORMAL LOW (ref 135–145)

## 2020-01-28 LAB — GLUCOSE, CAPILLARY
Glucose-Capillary: 60 mg/dL — ABNORMAL LOW (ref 70–99)
Glucose-Capillary: 105 mg/dL — ABNORMAL HIGH (ref 70–99)
Glucose-Capillary: 75 mg/dL (ref 70–99)
Glucose-Capillary: 60 mg/dL — ABNORMAL LOW (ref 70–99)
Glucose-Capillary: 79 mg/dL (ref 70–99)
Glucose-Capillary: 144 mg/dL — ABNORMAL HIGH (ref 70–99)
Glucose-Capillary: 72 mg/dL (ref 70–99)
Glucose-Capillary: 177 mg/dL — ABNORMAL HIGH (ref 70–99)

## 2020-01-28 LAB — PROTIME-INR
INR: 1.1 (ref 0.8–1.2)
Prothrombin Time: 14.1 seconds (ref 11.4–15.2)

## 2020-01-28 LAB — PHOSPHORUS: Phosphorus: 6 mg/dL — ABNORMAL HIGH (ref 2.5–4.6)

## 2020-01-28 LAB — TSH: TSH: 1.553 u[IU]/mL (ref 0.350–4.500)

## 2020-01-28 LAB — HEPARIN LEVEL (UNFRACTIONATED): Heparin Unfractionated: 0.4 IU/mL (ref 0.30–0.70)

## 2020-01-28 MED ORDER — DEXTROSE 50 % IV SOLN
25.0000 mL | Freq: Once | INTRAVENOUS | Status: AC
  Administered 2020-01-28: 25 mL via INTRAVENOUS
  Filled 2020-01-28: qty 50

## 2020-01-28 MED ORDER — WARFARIN SODIUM 5 MG PO TABS
5.0000 mg | ORAL_TABLET | Freq: Once | ORAL | Status: AC
  Administered 2020-01-29: 5 mg via ORAL
  Filled 2020-01-28 (×2): qty 1

## 2020-01-28 MED ORDER — WARFARIN SODIUM 5 MG PO TABS
5.0000 mg | ORAL_TABLET | Freq: Once | ORAL | Status: DC
  Filled 2020-01-28: qty 1

## 2020-01-28 MED ORDER — IPRATROPIUM-ALBUTEROL 0.5-2.5 (3) MG/3ML IN SOLN: 3.0000 mL | RESPIRATORY_TRACT | Status: DC | PRN

## 2020-01-28 MED ORDER — GUAIFENESIN ER 600 MG PO TB12
600.0000 mg | ORAL_TABLET | Freq: Two times a day (BID) | ORAL | Status: DC
  Administered 2020-01-28 – 2020-02-01 (×9): 600 mg via ORAL
  Filled 2020-01-28 (×10): qty 1

## 2020-01-28 MED ORDER — EPOETIN ALFA 10000 UNIT/ML IJ SOLN
4000.0000 [IU] | INTRAMUSCULAR | Status: DC
  Administered 2020-01-28: 4000 [IU] via INTRAVENOUS
  Filled 2020-01-28: qty 1

## 2020-01-28 MED ORDER — INSULIN GLARGINE 100 UNIT/ML ~~LOC~~ SOLN
17.0000 [IU] | Freq: Every day | SUBCUTANEOUS | Status: DC
  Administered 2020-01-30 – 2020-01-31 (×2): 17 [IU] via SUBCUTANEOUS
  Filled 2020-01-28 (×6): qty 0.17

## 2020-01-28 NOTE — Progress Notes (Addendum)
[name]  ROUNDING NOTE   Subjective:   Patient lying in bed, appears pleasant.  She still requires 4 L of oxygen, in mild respiratory distress.  We are planning for dialysis later today.  Objective:  Vital signs in last 24 hours:  Temp:  [97.7 F (36.5 C)-98 F (36.7 C)] 97.8 F (36.6 C) (12/10 1137) Pulse Rate:  [71-97] 85 (12/10 1137) Resp:  [18-19] 19 (12/10 1137) BP: (94-115)/(39-60) 102/59 (12/10 1137) SpO2:  [94 %-100 %] 98 % (12/10 1137) Weight:  [107.2 kg] 107.2 kg (12/10 0427)  Weight change: 0.385 kg Filed Weights   01/26/20 0305 01/27/20 0500 01/28/20 0427  Weight: 106.9 kg 106.8 kg 107.2 kg    Intake/Output: I/O last 3 completed shifts: In: 240 [P.O.:240] Out: 0    Intake/Output this shift:  Total I/O In: 480 [P.O.:480] Out: 0   Physical Exam: General:  Resting in bed  Head:  Oral mucous membranes moist  Eyes:  Anicteric  Lungs:   Lungs with rhonchi bilaterally, on 4 L of O2 via nasal cannula  Heart:  Regular rate and rhythm  Abdomen:  Soft, nontender, nondistended  Extremities:  No  peripheral edema.  Neurologic:  Answers questions appropriately  Skin: No acute  Lesions or rashes   Access: LUA AVF +bruit , +thrill    Basic Metabolic Panel: Recent Labs  Lab 01/24/20 0626 01/26/20 1121 01/27/20 0526 01/27/20 0829 01/28/20 0534  NA 136 131* 133* 131* 129*  K 5.6* 5.3* 4.9 4.8 4.5  CL 97* 92* 93* 93* 90*  CO2 28 26 27 27 26   GLUCOSE 170* 132* 74 59* 123*  BUN 28* 40* 39* 42* 50*  CREATININE 4.90* 5.66* 5.38* 5.75* 6.95*  CALCIUM 9.2 8.2* 8.2* 8.1* 8.2*  PHOS 5.4*  --  5.0* 5.7* 6.0*    Liver Function Tests: Recent Labs  Lab 01/23/20 1143 01/27/20 0829  AST 22  --   ALT 18  --   ALKPHOS 58  --   BILITOT 0.9  --   PROT 7.5  --   ALBUMIN 3.8 3.3*   No results for input(s): LIPASE, AMYLASE in the last 168 hours. No results for input(s): AMMONIA in the last 168 hours.  CBC: Recent Labs  Lab 01/24/20 0626  01/25/20 0704 01/26/20 0409 01/27/20 0829 01/28/20 0534  WBC 10.2 10.6* 10.0 7.1 7.2  HGB 10.7* 10.9* 9.9* 9.3* 9.6*  HCT 37.2 36.8 34.0* 32.4* 32.4*  MCV 100.8* 100.5* 99.7 100.3* 99.4  PLT 197 182 156 147* 167    Cardiac Enzymes: No results for input(s): CKTOTAL, CKMB, CKMBINDEX, TROPONINI in the last 168 hours.  BNP: Invalid input(s): POCBNP  CBG: Recent Labs  Lab 01/28/20 0419 01/28/20 0455 01/28/20 0803 01/28/20 1137 01/28/20 1211  GLUCAP 60* 105* 75 60* 79    Microbiology: Results for orders placed or performed during the hospital encounter of 01/23/20  Resp Panel by RT-PCR (Flu A&B, Covid) Nasopharyngeal Swab     Status: None   Collection Time: 01/23/20 11:43 AM   Specimen: Nasopharyngeal Swab; Nasopharyngeal(NP) swabs in vial transport medium  Result Value Ref Range Status   SARS Coronavirus 2 by RT PCR NEGATIVE NEGATIVE Final    Comment: (NOTE) SARS-CoV-2 target nucleic acids are NOT DETECTED.  The SARS-CoV-2 RNA is generally detectable in upper respiratory specimens during the acute phase of infection. The lowest concentration of SARS-CoV-2 viral copies this assay can detect is 138 copies/mL. A negative result does not preclude SARS-Cov-2 infection and should  not be used as the sole basis for treatment or other patient management decisions. A negative result may occur with  improper specimen collection/handling, submission of specimen other than nasopharyngeal swab, presence of viral mutation(s) within the areas targeted by this assay, and inadequate number of viral copies(<138 copies/mL). A negative result must be combined with clinical observations, patient history, and epidemiological information. The expected result is Negative.  Fact Sheet for Patients:  EntrepreneurPulse.com.au  Fact Sheet for Healthcare Providers:  IncredibleEmployment.be  This test is no t yet approved or cleared by the [place] FDA  and  has been authorized for detection and/or diagnosis of SARS-CoV-2 by FDA under an Emergency Use Authorization (EUA). This EUA will remain  in effect (meaning this test can be used) for the duration of the COVID-19 declaration under Section 564(b)(1) of the Act, 21 U.S.C.section 360bbb-3(b)(1), unless the authorization is terminated  or revoked sooner.       Influenza A by PCR NEGATIVE NEGATIVE Final   Influenza B by PCR NEGATIVE NEGATIVE Final    Comment: (NOTE) The Xpert Xpress SARS-CoV-2/FLU/RSV plus assay is intended as an aid in the diagnosis of influenza from Nasopharyngeal swab specimens and should not be used as a sole basis for treatment. Nasal washings and aspirates are unacceptable for Xpert Xpress SARS-CoV-2/FLU/RSV testing.  Fact Sheet for Patients: EntrepreneurPulse.com.au  Fact Sheet for Healthcare Providers: IncredibleEmployment.be  This test is not yet approved or cleared by the [place] FDA and has been authorized for detection and/or diagnosis of SARS-CoV-2 by FDA under an Emergency Use Authorization (EUA). This EUA will remain in effect (meaning this test can be used) for the duration of the COVID-19 declaration under Section 564(b)(1) of the Act, 21 U.S.C. section 360bbb-3(b)(1), unless the authorization is terminated or revoked.  Performed at [facility], [address] [place], [place] [address]     Coagulation Studies: Recent Labs    01/26/20 0409 01/27/20 0526 01/28/20 0534  LABPROT 14.1 14.3 14.1  INR 1.1 1.2 1.1    Urinalysis: No results for input(s): COLORURINE, LABSPEC, PHURINE, GLUCOSEU, HGBUR, BILIRUBINUR, KETONESUR, PROTEINUR, UROBILINOGEN, NITRITE, LEUKOCYTESUR in the last 72 hours.  Invalid input(s): APPERANCEUR    Imaging: No results found.   Medications:   . sodium chloride    . sodium chloride    . heparin 1,700 Units/hr (01/28/20 1439)   . amiodarone  200 mg  Oral Daily  . atorvastatin  40 mg Oral Daily  . Chlorhexidine Gluconate Cloth  6 each Topical Q0600  . DULoxetine  30 mg Oral Daily  . guaiFENesin  600 mg Oral BID  . insulin aspart  0-6 Units Subcutaneous TID WC  . insulin glargine  17 Units Subcutaneous QHS  . levothyroxine  88 mcg Oral QAC breakfast  . losartan  25 mg Oral Daily  . midodrine  10 mg Oral Q M,W,F-HD  . polyethylene glycol  51 g Oral Daily  . senna-docusate  1 tablet Oral QHS  . Vitamin D (Ergocalciferol)  50,000 Units Oral Q Tue  . warfarin  5 mg Oral Once  . Warfarin - Pharmacist Dosing Inpatient   Does not apply q1600   sodium chloride, sodium chloride, alteplase, heparin, ipratropium-albuterol, lactulose, lidocaine (PF), lidocaine-prilocaine, ondansetron **OR** ondansetron (ZOFRAN) IV, oxyCODONE-acetaminophen **AND** oxyCODONE, pentafluoroprop-tetrafluoroeth  Assessment/ Plan:  [name] is a 69 y.o.  female with medical problems including Diabetes Type 2, HF with EF of 45%, ESRD on HD MWF, presented to the ED on 01/22/2020  for SOB.  #ESRD on HD MWF #Volume overload CCKA,Dvta [place],MWF, LUA AVF  We are planning for dialysis treatment today We will continue Monday Wednesday Friday schedule  # Hyperkalemia  potassium 4.5 today  #Hyponatremia  Serum Sodium 129 today Will continue monitoring closely  #Anemia of CKD Lab Results  Component Value Date   HGB 9.6 (L) 01/28/2020   Restarted Epogen with dialysis treatments  #Secondary Hyperparathyroidism Lab Results  Component Value Date   PTH 173 (H) 01/24/2020   CALCIUM 8.2 (L) 01/28/2020   CAION 1.14 (L) 01/29/2019   PHOS 6.0 (H) 01/28/2020  We will continue monitoring bone mineral metabolism parameters  #Diabetes Type 2 with CKD Lab Results  Component Value Date   HGBA1C 6.8 (H) 09/07/2019  Continue current antihyperglycemic regimen of insulin aspart and insulin glargine   LOS: [place] 12/10/20213:04 PM

## 2020-01-28 NOTE — Progress Notes (Signed)
Mobility Specialist - Progress Note   01/28/20 1156  Mobility  Range of Motion/Exercises Right leg;Left leg (ankle pumps, slr)  Level of Assistance Contact guard assist, steadying assist  Assistive Device None  Distance Ambulated (ft) 0 ft  Mobility Response Tolerated well  Mobility performed by Mobility specialist  $Mobility charge 1 Mobility    Pre-mobility: 85 HR, 98% SpO2   Pt was lying in bed upon arrival. Pt agreed to session. Pt c/o congestion and weakness, denied chest pain. Pt performed supine exercises: ankle pumps x10 and straight leg raises x8. NT entered room for vital check. Noted drop in glucose levels (60). Session was concluded and nurse was notified.   Kathee Delton Mobility Specialist 01/28/20, 12:05 PM

## 2020-01-28 NOTE — Consult Note (Addendum)
[place] for Warfarin/Heparin Indication: afib s/p TAVR.   Allergies  Allergen Reactions  . Buspirone Other (See Comments)    Weakness  . Diltiazem Hcl Palpitations  . Gabapentin Palpitations  . Hydralazine Rash  . Lisinopril Cough  . Lovastatin Palpitations  . Metformin Diarrhea  . Pravastatin Other (See Comments)    Insomnia  . Sitagliptin Other (See Comments)    Constipation  . Tramadol Itching   Patient Measurements: Height: 5\' 3"  (160 cm) Weight: 107.2 kg (236 lb 4.8 oz) IBW/kg (Calculated) : 52.4  Vital Signs: Temp: 97.7 F (36.5 C) (12/10 0427) Temp Source: Oral (12/10 0427) BP: 94/59 (12/10 0427) Pulse Rate: 97 (12/10 0427)  Labs: Recent Labs    01/26/20 0409 01/26/20 1121 01/27/20 0526 01/27/20 0829 01/27/20 1728 01/28/20 0534  HGB 9.9*  --   --  9.3*  --  9.6*  HCT 34.0*  --   --  32.4*  --  32.4*  PLT 156  --   --  147*  --  167  LABPROT 14.1  --  14.3  --   --  14.1  INR 1.1  --  1.2  --   --  1.1  HEPARINUNFRC 0.22*   < >  --  0.52 0.37 0.40  CREATININE  --    < > 5.38* 5.75*  --  6.95*   < > = values in this interval not displayed.   Estimated Creatinine Clearance: 9 mL/min (A) (by C-G formula based on SCr of 6.95 mg/dL (H)).  Medical History: Past Medical History:  Diagnosis Date  . Anemia   . Anxiety   . Aortic valve stenosis   . Arrhythmia    atrial fibrillation  . Arthritis    feet, legs  . B12 deficiency   . Bowel obstruction ([place])   . CHF (congestive heart failure) ([place])   . CKD (chronic kidney disease)    protein in urine  . Colostomy in place [facility])   . Diabetes mellitus without complication ([place])   . Dialysis patient [facility])    T/Th/Sa  . Diverticulitis large intestine   . Dysrhythmia   . 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 FSGS (focal segmental glomerulosclerosis)   . Gastritis   . GI bleed   . Heart murmur   . Hyperlipidemia   . Hypertension   . Hypothyroidism   .  Hypothyroidism    unspecified  . MRSA (methicillin resistant Staphylococcus aureus)    at abdominal wound.  Jan 2017.  Treated.   . Neuropathy   . Neuropathy in diabetes ([place])   . Obesity   . Pedal edema   . Psoriasis   . Shortness of breath dyspnea   . Sleep apnea    CPAP  . Vertigo    Medications:  Warfarin 1.5mg  daily; last dose was 12/4 - pharmacy tech confirmed dose with patient's daughter  Assessment: 69yo female with a past medical history of anemia, anxiety, diabetes, CHF, CKD ESRD (hemodialysis MWF), hypertension, obesity, presents to the emergency department for shortness of breath. Patient was on Eliquis in the past and was switched to warfarin around October 2021. Pharmacy has been consulted for warfarin dosing and monitoring for afib and s/p TAVR (2019). Patient follows at the [facility]. Spoke with patient's daughter - she stated patient was previously on warfarin 2mg  and was changed to 1mg  around 12/19/19 due to having darker stools, then patient was recently changed to warfarin 1.5mg  12/2. CHADSVASc  of 5. Plan to bridge warfarin with heparin gtt. Warfarin is not dialyzed.   DDI: amiodarone and levothyroxine    12/6 2255 HL 0.14  increase Heparin to 1200 units/hr  12/7 0704 HL 0.22 1000 unit bolus and increase heparin infusion to 1350 units/hr.  12/8 0409 HL 0.22 1000 unit bolus and increase heparin infusion to 1550 units/hr 12/8 1356 HL 0.41 12/8 2248 HL 0.11 increase heparin infusion to 1700 units/hr. Seem to be a lab drawn error.  12/9 0829 HL 0.52, therapeutic x 1 12/9 1728 HL 0.37, therapeutic x 2 12/10 0534 HL 0.40, therapeutic x 3  Date INR Dose 12/5 1.1 Not given 12/6  1.1 3 mg  12/7 1.1 3 mg - not charted given.  12/8 1.1 5 mg - not charted given  12/9 1.2 5 mg 12/10  1.1 5 mg   Goal of Therapy:  INR 2-3 for warfarin  Heparin level 0.3 - 0.7.  Monitor platelets by anticoagulation protocol: Yes   Plan:  Heparin:   Heparin level is  therapeutic. Will continue heparin infusion at 1700 units/hr. Recheck heparin level with AM labs. CBC daily while on heparin.   Warfarin:  INR subtherapeutic. Will give warfarin 5mg  tonight x 1. Daily INR ordered. INR probably not increasing due to missed dose on 12/7 and 12/8.    Eleonore Chiquito, PharmD, BCPS Clinical Pharmacist  01/28/2020 8:00 AM

## 2020-01-28 NOTE — Progress Notes (Signed)
PROGRESS NOTE    [name]  MEQ:[id number] DOB: 06-Apr-1950 DOA: 01/23/2020 PCP: Adin Hector, MD   Brief Narrative: Taken from H&P  [name] is a 69 y.o. female with medical history significant for insulin-dependent diabetes mellitus type 2, heart failure with reduced ejection fraction, end-stage renal disease on hemodialysis, obstructive sleep apnea not wearing CPAP, hypertension, aortic atherosclerosis, persistent atrial fibrillation, status post TAVR, morbid obesity, acquired hypothyroid, history of focal segmental glomerulosclerosis, presented to the emergency department via EMS for chief concerns of shortness of breath.   Shortness of breath that started progressively worsened throughout the day 01/22/20. Daughter denies fever, nausea, vomiting, chest pain, abdominal pain, dysuria, hematuria, diarrhea, blood in her stool. Patient and daughter states that she used her home oxygen without much relief.   Family endorses new increased productive cough with yellow mucus throughout the day. Endorses increased swelling in her legs.   Patient has frequent recent hospitalizations at [facility] with similar symptoms.  Also found to have reduced EF to 25% on repeat echo and a valvular thrombus.  Her Eliquis was transitioned to warfarin.  Also recent GI bleed with aspirin and warfarin, initially discontinue both and then later started on warfarin only. She was also recommended to have dialysis 4 times a week to prevent volume overload.  Hardly makes any urine, volume is being managed with dialysis.  Cardiology was consulted.  Subjective: Continues w/ dyspnea but up in bed, off non-rebreather. Hasn't required bipap since evening of 12/6. Had dialysis yesterday. Fatigue continues.  Assessment & Plan:   Principal Problem:   Acute hypoxemic respiratory failure (HCC) Active Problems:   Obesity   Anemia   Benign essential HTN   Acquired atrophy of thyroid   FSGS (focal segmental  glomerulosclerosis)   Acute kidney injury superimposed on chronic kidney disease (HCC)   S/P TAVR (transcatheter aortic valve replacement)   DM type 2 with diabetic peripheral neuropathy (HCC)   Acute respiratory failure with hypoxia (HCC)   DDD (degenerative disc disease), lumbosacral   OSA (obstructive sleep apnea)  Acute on chronic hypoxic respiratory failure.  Most likely cardiogenic pulmonary edema/volume overload secondary to systolic CHF exacerbation, complicated by ESRD. Makes minimal urine. Dialyzed 12/7 after angioplasty of brachiocephalic fiscula by IR, and again on 12/8 and 12/9.  Baseline 3 L [place] O2, currently on 6 L. Recent echocardiogram with worsening of her EF to 25%, she also had right and left cardiac catheterization recently done at [facility]. -Volume is being managed with dialysis -Might need more frequent dialysis to prevent recurrent hospitalizations with similar symptoms. - will repeat cxr to eval for patient's complaint of persistent congested sensation, wheezing on exam -Continue with supportive care.  HFrEF  Most recent EF 20%. -Cardiology was consulted-appreciate their recommendations -defer institution of evidence-based meds to cardiology - volume mgmt via dialysis  ESRD on HD(MWF).  Per patient she is quite compliant with her dialysis. 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 Difficult access, 12/9 dialysis stopped short.  - nephrology will try dialysis again today  Persistent A. fib.  She was previously on Eliquis which was transitioned to warfarin due to history of aortic valve thrombosis. Rate controlled this morning in the 90s -Continue with amiodarone -Continue warfarin per pharmacy, on heparin until INR therapeutic (watch platelets while on heparin, 147 today) - f/u tsh  Hyperkalemia wnl today - hold lokelma  Hyponatremia Sodium 129 today, low 130s last couple of days from baseline of around 135. Doesn't appear  to be med related. Has  had several inadequate dialysis sessions - will touch base w/ nephrology to check whether they think this is largely associated w/ esrd/dialysis  History of chronic pain. -Continue home oxycodone  Aortic stenosis s/p TAVR.  History of recent aortic valve thrombosis. -Continue anticoagulation as above  Insulin-dependent diabetes mellitus. Well controlled, a1c 6.8 08/2019 -Continue SSI, decreased home lantus to 35   Obstructive sleep apnea.  She is unable to tolerate auto CPAP. She will need outpatient pulmonology referral for further recommendations.  Hypothyroidism. -Continue home Synthroid.  Constipation - Standing miralax/senna, increased miralax dose to 54 daily, added lactulose prn  Debility - PT/OT consulted  Morbid obesity. Body mass index is 41.86 kg/m.  This will complicate overall prognosis.  Objective: Vitals:   01/27/20 1718 01/27/20 1923 01/28/20 0427 01/28/20 0806  BP: (!) 115/39 (!) 105/56 (!) 94/59 96/60  Pulse: 83 88 97 71  Resp: 19 18  19   Temp: 98 F (36.7 C) 97.7 F (36.5 C) 97.7 F (36.5 C) 97.9 F (36.6 C)  TempSrc: Oral  Oral   SpO2: 94% 100% 100% 100%  Weight:   107.2 kg   Height:        Intake/Output Summary (Last 24 hours) at 01/28/2020 1126 Last data filed at 01/28/2020 1015 Gross per 24 hour  Intake 480 ml  Output 0 ml  Net 480 ml   Filed Weights   01/26/20 0305 01/27/20 0500 01/28/20 0427  Weight: 106.9 kg 106.8 kg 107.2 kg    Examination:  General.  Morbidly obese lady, mildly distressed. Pulmonary.  Bilateral basal crackles, exp wheezes CV.  Regular rate and rhythm, no JVD, rub or murmur. Abdomen.  Soft, nontender, nondistended, BS positive. Morbidly obese CNS.  Alert and oriented x3.  No focal neurologic deficit. Extremities.  Trace LE edema, no cyanosis, pulses intact and symmetrical. Psychiatry.  Judgment and insight appears normal.  DVT prophylaxis: Coumadin/heparin Code Status: Full Family Communication:  Discussed with patient Disposition Plan:  Status is: Inpatient  Remains inpatient appropriate because:Inpatient level of care appropriate due to severity of illness   Dispo: The patient is from: Home              Anticipated d/c is to: Home (patient declines SNF) with home health              Anticipated d/c date is: 2-5 days              Patient currently is not medically stable to d/c.   Consultants:   Cardiology  Nephrology  Procedures:  Antimicrobials:   Data Reviewed: I have personally reviewed following labs and imaging studies  CBC: Recent Labs  Lab 01/24/20 0626 01/25/20 0704 01/26/20 0409 01/27/20 0829 01/28/20 0534  WBC 10.2 10.6* 10.0 7.1 7.2  HGB 10.7* 10.9* 9.9* 9.3* 9.6*  HCT 37.2 36.8 34.0* 32.4* 32.4*  MCV 100.8* 100.5* 99.7 100.3* 99.4  PLT 197 182 156 147* 254   Basic Metabolic Panel: Recent Labs  Lab 01/24/20 0626 01/26/20 1121 01/27/20 0526 01/27/20 0829 01/28/20 0534  NA 136 131* 133* 131* 129*  K 5.6* 5.3* 4.9 4.8 4.5  CL 97* 92* 93* 93* 90*  CO2 28 26 27 27 26   GLUCOSE 170* 132* 74 59* 123*  BUN 28* 40* 39* 42* 50*  CREATININE 4.90* 5.66* 5.38* 5.75* 6.95*  CALCIUM 9.2 8.2* 8.2* 8.1* 8.2*  PHOS 5.4*  --  5.0* 5.7*  --    GFR: Estimated Creatinine Clearance:  9 mL/min (A) (by C-G formula based on SCr of 6.95 mg/dL (H)). Liver Function Tests: Recent Labs  Lab 01/23/20 1143 01/27/20 0829  AST 22  --   ALT 18  --   ALKPHOS 58  --   BILITOT 0.9  --   PROT 7.5  --   ALBUMIN 3.8 3.3*   No results for input(s): LIPASE, AMYLASE in the last 168 hours. No results for input(s): AMMONIA in the last 168 hours. Coagulation Profile: Recent Labs  Lab 01/24/20 0626 01/25/20 0704 01/26/20 0409 01/27/20 0526 01/28/20 0534  INR 1.1 1.1 1.1 1.2 1.1   Cardiac Enzymes: No results for input(s): CKTOTAL, CKMB, CKMBINDEX, TROPONINI in the last 168 hours. BNP (last 3 results) No results for input(s): PROBNP in the last 8760  hours. HbA1C: No results for input(s): HGBA1C in the last 72 hours. CBG: Recent Labs  Lab 01/27/20 2133 01/27/20 2314 01/28/20 0419 01/28/20 0455 01/28/20 0803  GLUCAP 104* 104* 60* 105* 75   Lipid Profile: No results for input(s): CHOL, HDL, LDLCALC, TRIG, CHOLHDL, LDLDIRECT in the last 72 hours. Thyroid Function Tests: No results for input(s): TSH, T4TOTAL, FREET4, T3FREE, THYROIDAB in the last 72 hours. Anemia Panel: No results for input(s): VITAMINB12, FOLATE, FERRITIN, TIBC, IRON, RETICCTPCT in the last 72 hours. Sepsis Labs: No results for input(s): PROCALCITON, LATICACIDVEN in the last 168 hours.  Recent Results (from the past 240 hour(s))  Resp Panel by RT-PCR (Flu A&B, Covid) Nasopharyngeal Swab     Status: None   Collection Time: 01/23/20 11:43 AM   Specimen: Nasopharyngeal Swab; Nasopharyngeal(NP) swabs in vial transport medium  Result Value Ref Range Status   SARS Coronavirus 2 by RT PCR NEGATIVE NEGATIVE Final    Comment: (NOTE) SARS-CoV-2 target nucleic acids are NOT DETECTED.  The SARS-CoV-2 RNA is generally detectable in upper respiratory specimens during the acute phase of infection. The lowest concentration of SARS-CoV-2 viral copies this assay can detect is 138 copies/mL. A negative result does not preclude SARS-Cov-2 infection and should not be used as the sole basis for treatment or other patient management decisions. A negative result may occur with  improper specimen collection/handling, submission of specimen other than nasopharyngeal swab, presence of viral mutation(s) within the areas targeted by this assay, and inadequate number of viral copies(<138 copies/mL). A negative result must be combined with clinical observations, patient history, and epidemiological information. The expected result is Negative.  Fact Sheet for Patients:  EntrepreneurPulse.com.au  Fact Sheet for Healthcare Providers:   IncredibleEmployment.be  This test is no t yet approved or cleared by the [place] FDA and  has been authorized for detection and/or diagnosis of SARS-CoV-2 by FDA under an Emergency Use Authorization (EUA). This EUA will remain  in effect (meaning this test can be used) for the duration of the COVID-19 declaration under Section 564(b)(1) of the Act, 21 U.S.C.section 360bbb-3(b)(1), unless the authorization is terminated  or revoked sooner.       Influenza A by PCR NEGATIVE NEGATIVE Final   Influenza B by PCR NEGATIVE NEGATIVE Final    Comment: (NOTE) The Xpert Xpress SARS-CoV-2/FLU/RSV plus assay is intended as an aid in the diagnosis of influenza from Nasopharyngeal swab specimens and should not be used as a sole basis for treatment. Nasal washings and aspirates are unacceptable for Xpert Xpress SARS-CoV-2/FLU/RSV testing.  Fact Sheet for Patients: EntrepreneurPulse.com.au  Fact Sheet for Healthcare Providers: IncredibleEmployment.be  This test is not yet approved or cleared by the [place] and  has been authorized for detection and/or diagnosis of SARS-CoV-2 by FDA under an Emergency Use Authorization (EUA). This EUA will remain in effect (meaning this test can be used) for the duration of the COVID-19 declaration under Section 564(b)(1) of the Act, 21 U.S.C. section 360bbb-3(b)(1), unless the authorization is terminated or revoked.  Performed at [facility], [address] [place], [place] [address]      Radiology Studies: No results found.  Scheduled Meds: . amiodarone  200 mg Oral Daily  . atorvastatin  40 mg Oral Daily  . Chlorhexidine Gluconate Cloth  6 each Topical Q0600  . DULoxetine  30 mg Oral Daily  . insulin aspart  0-6 Units Subcutaneous TID WC  . insulin glargine  17 Units Subcutaneous QHS  . levothyroxine  88 mcg Oral QAC breakfast  . losartan  25 mg Oral Daily  .  midodrine  10 mg Oral Q M,W,F-HD  . polyethylene glycol  51 g Oral Daily  . senna-docusate  1 tablet Oral QHS  . Vitamin D (Ergocalciferol)  50,000 Units Oral Q Tue  . warfarin  5 mg Oral Once  . Warfarin - Pharmacist Dosing Inpatient   Does not apply q1600   Continuous Infusions: . sodium chloride    . sodium chloride    . heparin 1,700 Units/hr (01/28/20 0026)     LOS: 4 days   Time spent: 35 minutes.  Desma Maxim, MD [facility]  If 7PM-7AM, please contact night-coverage [email or web address]  01/28/2020, 11:26 AM

## 2020-01-28 NOTE — TOC Initial Note (Signed)
Transition of Care [facility]) - Initial/Assessment Note    Patient Details  Name: [name] MRN: [id number] Date of Birth: 1950/08/30  Transition of Care [facility] CM/SW Contact:    Eileen Stanford, LCSW Phone Number: 01/28/2020, 3:25 PM  Clinical Narrative:                  CSW met with pt at bedside. Pt's daughter was present. Pt lives with her daughter. Pt is agreeable to [facility]. Pt and pt's daughter understanding of insurance barrier and states if needed pt's daughter could transport her to outpatient PT-- but they would prefer HH. CSW explained the process. CSW will reach out to [facility] agencies to see if anyone can service the patient. Pt has a walker at home. Pt has 02 through [employer] and was inquiring about a portable tank--Zach with [employer] notified.   Pt states her daughter transfers her to appointments. Pt uses [facility] in [place]. Pt sees Dr. Caryl Comes at [place].   Expected Discharge Plan: [place] Barriers to Discharge: Continued Medical Work up   Patient Goals and CMS Choice Patient states their goals for this hospitalization and ongoing recovery are:: to get better   Choice offered to / list presented to : Patient  Expected Discharge Plan and Services Expected Discharge Plan: [place] In-house Referral: NA   Post Acute Care Choice: [place] arrangements for the past 2 months: Single Family Home                 DME Arranged: Oxygen DME Agency: [employer]                  Prior Living Arrangements/Services Living arrangements for the past 2 months: Single Family Home Lives with:: Adult Children Patient language and need for interpreter reviewed:: Yes Do you feel safe going back to the place where you live?: Yes      Need for Family Participation in Patient Care: Yes (Comment) Care giver support system in place?: Yes (comment)   Criminal Activity/Legal Involvement Pertinent to Current Situation/Hospitalization: No -  Comment as needed  Activities of Daily Living Home Assistive Devices/Equipment: Eyeglasses,Walker (specify type) ADL Screening (condition at time of admission) Patient's cognitive ability adequate to safely complete daily activities?: Yes Is the patient deaf or have difficulty hearing?: No Does the patient have difficulty seeing, even when wearing glasses/contacts?: No Does the patient have difficulty concentrating, remembering, or making decisions?: No Patient able to express need for assistance with ADLs?: Yes Does the patient have difficulty dressing or bathing?: Yes Independently performs ADLs?: Yes (appropriate for developmental age) Does the patient have difficulty walking or climbing stairs?: Yes Weakness of Legs: None Weakness of Arms/Hands: None  Permission Sought/Granted Permission sought to share information with : Family Supports    Share Information with NAME: Dealer granted to share info w Relationship: daughter     Emotional Assessment Appearance:: Appears stated age Attitude/Demeanor/Rapport: Engaged Affect (typically observed): Accepting,Appropriate Orientation: : Oriented to Situation,Oriented to  Time,Oriented to Place,Oriented to Self Alcohol / Substance Use: Not Applicable Psych Involvement: No (comment)  Admission diagnosis:  Hypoxia [R09.02] Hypervolemia, unspecified hypervolemia type [E87.70] Acute hypoxemic respiratory failure ([place]) [J96.01] Patient Active Problem List   Diagnosis Date Noted  . Acute hypoxemic respiratory failure ([place]) 01/23/2020  . Complication from renal dialysis device 10/12/2019  . Acute on chronic diastolic CHF (congestive heart failure) ([place]) 09/07/2019  . Pharmacologic therapy 06/30/2019  .  Chronic respiratory failure with hypoxia ([place]) 06/22/2019  . Hypoxia 06/10/2019  . Spondylosis without myelopathy or radiculopathy, lumbosacral region 11/05/2018  . DDD (degenerative disc disease), lumbosacral 11/05/2018  .  Hematochezia 09/03/2018  . Hemorrhoids, internal 09/03/2018  . Proctitis 09/03/2018  . OSA (obstructive sleep apnea) 08/24/2018  . Acute respiratory failure with hypoxia ([place]) 07/12/2018  . DM type 2 with diabetic peripheral neuropathy ([place]) 11/25/2017  . CKD (chronic kidney disease) stage 5, GFR less than 15 ml/min (HCC) 10/05/2017  . 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 Acute kidney injury superimposed on chronic kidney disease ([place]) 09/01/2017  . Chronic systolic CHF (congestive heart failure) ([place]) 08/19/2017  . Paroxysmal A-fib ([place]) 08/14/2017  . Cervical myelopathy ([place]) 08/07/2017  . Onychomycosis of multiple toenails with type 2 diabetes mellitus and peripheral neuropathy ([place]) 06/23/2017  . Myofascial pain 05/27/2017  . Chronic pain of left upper extremity 05/27/2017  . Morbid obesity ([place]) 05/06/2017  . Uncontrolled type 2 diabetes mellitus with hyperglycemia, with long-term current use of insulin ([place]) 02/07/2017  . Hyperlipidemia, mixed 08/08/2016  . Hx of adenomatous colonic polyps 06/14/2016  . 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 Chronic pain syndrome 03/11/2016  . Obesity, Class II, BMI 35-39.9 03/11/2016  . Osteopenia of multiple sites 01/26/2016  . Heart burn 12/18/2015  . Polyneuropathy 11/16/2015  . Lumbar spondylosis 09/25/2015  . Lumbar facet syndrome (Bilateral) (L>R) 09/25/2015  . Recurrent major depressive disorder, in full remission ([place]) 09/19/2015  . Diabetic neuropathy ([place]) 09/06/2015  . H/O colostomy 07/12/2015  . Long term current use of opiate analgesic 07/03/2015  . Long term prescription opiate use 07/03/2015  . Opiate use (22.5 MME/Day) 07/03/2015  . Encounter for therapeutic drug level monitoring 07/03/2015  . Encounter for pain management planning 07/03/2015  . Neurogenic pain 07/03/2015  . Chronic musculoskeletal pain 07/03/2015  . Visceral abdominal pain 07/03/2015  . Diabetic peripheral  neuropathy ([place]) 07/03/2015  . Chronic low back pain (Primary Area of Pain) (Bilateral) (L>R) 07/03/2015  . Chronic lower extremity pain (Secondary area of Pain) (Bilateral) (R>L) 07/03/2015  . Chronic hip pain (Bilateral) (L>R) 07/03/2015  . Chronic neck pain (Bilateral) (L>R) 07/03/2015  . Anemia 02/16/2015  . Arthritis 02/16/2015  . End stage renal disease ([place]) 02/16/2015  . Focal and segmental hyalinosis 02/16/2015  . Bleeding gastrointestinal 02/16/2015  . Acquired atrophy of thyroid 02/16/2015  . Psoriasis 02/16/2015  . Small bowel obstruction ([place]) 02/04/2015  . Severe aortic stenosis 10/11/2014  . Paroxysmal supraventricular tachycardia ([place]) 10/11/2014  . SOBOE (shortness of breath on exertion) 10/11/2014  . Benign essential HTN 10/04/2014  . Obesity 09/05/2014  . B12 deficiency 03/02/2014  . Diabetes mellitus type 2 in obese ([place]) 12/01/2013  . Abnormal presence of protein in urine 10/15/2010   PCP:  Adin Hector, MD Pharmacy[facility]   [id number] [place] [place] [address]Phone: [phone]: [phone]    Social Determinants of Health (SDOH) Interventions    Readmission Risk Interventions Readmission Risk Prevention Plan 01/28/2020 09/09/2019 06/14/2019  Transportation Screening Complete Complete Complete  PCP or Specialist Appt within 5-7 Days - - -  PCP or Specialist Appt within 3-5 Days - Complete Complete  Home Care Screening - - -  Medication Review (RN CM) - - -  [place] Home Care Consult - Complete Complete  Social Work Consult for [place]/Counseling - Complete Complete  Palliative Care Screening -  Not Applicable Not Applicable  Medication Review (RN Care Manager) (No Data) Complete Complete  PCP or Specialist appointment within 3-5 days of discharge Complete - -  HRI or Home Care Consult Complete - -  SW Recovery Care/Counseling Consult Complete - -  Palliative Care Screening Not  Applicable - -  [place] Not Applicable - -  Some recent data might be hidden

## 2020-01-28 NOTE — Progress Notes (Signed)
Received report from Sherron Monday, RN from 2A for pt transferring from room 244 to 132.

## 2020-01-28 NOTE — Progress Notes (Signed)
Hypoglycemic Event  CBG: 60  Treatment: 4 oz juice/soda  Symptoms: None  Follow-up CBG: Time:1211 CBG Result:79  Possible Reasons for Event: Inadequate meal intake      Jackelyn Knife

## 2020-01-28 NOTE — Progress Notes (Signed)
Report called to [place], RN on 1A, patient being transferred to room 132 after dialysis

## 2020-01-28 NOTE — Consult Note (Signed)
[place] for Warfarin/Heparin Indication: afib s/p TAVR.   Allergies  Allergen Reactions  . Buspirone Other (See Comments)    Weakness  . Diltiazem Hcl Palpitations  . Gabapentin Palpitations  . Hydralazine Rash  . Lisinopril Cough  . Lovastatin Palpitations  . Metformin Diarrhea  . Pravastatin Other (See Comments)    Insomnia  . Sitagliptin Other (See Comments)    Constipation  . Tramadol Itching   Patient Measurements: Height: 5\' 3"  (160 cm) Weight: 107.2 kg (236 lb 4.8 oz) IBW/kg (Calculated) : 52.4  Vital Signs: Temp: 97.7 F (36.5 C) (12/10 0427) Temp Source: Oral (12/10 0427) BP: 94/59 (12/10 0427) Pulse Rate: 97 (12/10 0427)  Labs: Recent Labs    01/26/20 0409 01/26/20 1121 01/27/20 0526 01/27/20 0829 01/27/20 1728 01/28/20 0534  HGB 9.9*  --   --  9.3*  --  9.6*  HCT 34.0*  --   --  32.4*  --  32.4*  PLT 156  --   --  147*  --  167  LABPROT 14.1  --  14.3  --   --  14.1  INR 1.1  --  1.2  --   --  1.1  HEPARINUNFRC 0.22*   < >  --  0.52 0.37 0.40  CREATININE  --    < > 5.38* 5.75*  --  6.95*   < > = values in this interval not displayed.   Estimated Creatinine Clearance: 9 mL/min (A) (by C-G formula based on SCr of 6.95 mg/dL (H)).  Medical History: Past Medical History:  Diagnosis Date  . Anemia   . Anxiety   . Aortic valve stenosis   . Arrhythmia    atrial fibrillation  . Arthritis    feet, legs  . B12 deficiency   . Bowel obstruction ([place])   . CHF (congestive heart failure) ([place])   . CKD (chronic kidney disease)    protein in urine  . Colostomy in place [facility])   . Diabetes mellitus without complication ([place])   . Dialysis patient [facility])    T/Th/Sa  . Diverticulitis large intestine   . Dysrhythmia   . Ectopic atrial tachycardia ([place])   . FSGS (focal segmental glomerulosclerosis)   . Gastritis   . GI bleed   . Heart murmur   . Hyperlipidemia   . Hypertension   . Hypothyroidism   .  Hypothyroidism    unspecified  . MRSA (methicillin resistant Staphylococcus aureus)    at abdominal wound.  Jan 2017.  Treated.   . Neuropathy   . Neuropathy in diabetes ([place])   . Obesity   . Pedal edema   . Psoriasis   . Shortness of breath dyspnea   . Sleep apnea    CPAP  . Vertigo    Medications:  Warfarin 1.5mg  daily; last dose was 12/4 - pharmacy tech confirmed dose with patient's daughter  Assessment: 69yo female with a past medical history of anemia, anxiety, diabetes, CHF, CKD ESRD (hemodialysis MWF), hypertension, obesity, presents to the emergency department for shortness of breath. Patient was on Eliquis in the past and was switched to warfarin around October 2021. Pharmacy has been consulted for warfarin dosing and monitoring for afib and s/p TAVR (2019). Patient [facility] at the [facility]. Hgb 11.3 slightly decreased but appears to be patient's baseline. Spoke with patient's daughter - she stated patient was previously on warfarin 2mg  and was changed to 1mg  around 12/19/19 due to having darker stools,  then patient was recently changed to warfarin 1.5mg  12/2. CHADSVASc of 5. Plan to bridge warfarin with heparin gtt.   DDI: amiodarone and levothyroxine    12/6 2255 HL 0.14  increase Heparin to 1200 units/hr  12/7 0704 HL 0.22 1000 unit bolus and increase heparin infusion to 1350 units/hr.  12/8 0409 HL 0.22 1000 unit bolus and increase heparin infusion to 1550 units/hr 12/8 1356 HL 0.41 12/8 2248 HL 0.11 increase heparin infusion to 1700 units/hr. Seem to be a lab drawn error.  12/9 0829 HL 0.52, therapeutic x 1 12/9 1728 HL 0.37, therapeutic x 2 12/10 0534 HL 0.40, therapeutic x 3  Date INR Dose 12/5 1.1 Not given 12/6  1.1 3 mg  12/7 1.1 3 mg - not charted given.  12/8 1.1 5 mg - not charted given  12/9 1.2 5 mg   Goal of Therapy:  INR 2-3 for warfarin  Heparin level 0.3 - 0.7.  Monitor platelets by anticoagulation protocol: Yes   Plan:  Heparin:    Heparin level is therapeutic. Will continue heparin infusion at 1700 units/hr. Recheck heparin level with AM labs. CBC daily while on heparin.   Warfarin:  INR subtherapeutic. Will give warfarin 5mg  tonight x 1. Daily INR ordered.    Hart Robinsons, PharmD Clinical Pharmacist  01/28/2020 6:28 AM

## 2020-01-28 NOTE — Progress Notes (Signed)
Hypoglycemic Event  CBG: 61  Treatment:D50 59ml.   Symptoms: none  Follow-up CBG: Time:0455 CBG Result:105

## 2020-01-29 LAB — CBC
HCT: 33.8 % — ABNORMAL LOW (ref 36.0–46.0)
Hemoglobin: 10.3 g/dL — ABNORMAL LOW (ref 12.0–15.0)
MCH: 29.5 pg (ref 26.0–34.0)
MCHC: 30.5 g/dL (ref 30.0–36.0)
MCV: 96.8 fL (ref 80.0–100.0)
Platelets: 191 10*3/uL (ref 150–400)
RBC: 3.49 MIL/uL — ABNORMAL LOW (ref 3.87–5.11)
RDW: 15.3 % (ref 11.5–15.5)
WBC: 8.6 10*3/uL (ref 4.0–10.5)
nRBC: 0.3 % — ABNORMAL HIGH (ref 0.0–0.2)

## 2020-01-29 LAB — GLUCOSE, CAPILLARY
Glucose-Capillary: 118 mg/dL — ABNORMAL HIGH (ref 70–99)
Glucose-Capillary: 103 mg/dL — ABNORMAL HIGH (ref 70–99)
Glucose-Capillary: 90 mg/dL (ref 70–99)

## 2020-01-29 LAB — BASIC METABOLIC PANEL
Anion gap: 10 (ref 5–15)
BUN: 20 mg/dL (ref 8–23)
CO2: 28 mmol/L (ref 22–32)
Calcium: 8.2 mg/dL — ABNORMAL LOW (ref 8.9–10.3)
Chloride: 95 mmol/L — ABNORMAL LOW (ref 98–111)
Creatinine, Ser: 4.25 mg/dL — ABNORMAL HIGH (ref 0.44–1.00)
GFR, Estimated: 11 mL/min — ABNORMAL LOW (ref >60)
Glucose, Bld: 149 mg/dL — ABNORMAL HIGH (ref 70–99)
Potassium: 4.3 mmol/L (ref 3.5–5.1)
Sodium: 133 mmol/L — ABNORMAL LOW (ref 135–145)

## 2020-01-29 LAB — HEPARIN LEVEL (UNFRACTIONATED): Heparin Unfractionated: 0.52 IU/mL (ref 0.30–0.70)

## 2020-01-29 LAB — PROTIME-INR
INR: 1.3 — ABNORMAL HIGH (ref 0.8–1.2)
Prothrombin Time: 15.3 seconds — ABNORMAL HIGH (ref 11.4–15.2)

## 2020-01-29 MED ORDER — WARFARIN SODIUM 5 MG PO TABS
5.0000 mg | ORAL_TABLET | Freq: Once | ORAL | Status: AC
  Administered 2020-01-29: 22:00:00 5 mg via ORAL
  Filled 2020-01-29: qty 1

## 2020-01-29 MED ORDER — ALBUMIN HUMAN 25 % IV SOLN
50.0000 g | Freq: Once | INTRAVENOUS | Status: AC
  Administered 2020-01-29: 17:00:00 50 g via INTRAVENOUS
  Filled 2020-01-29: qty 200

## 2020-01-29 MED ORDER — IPRATROPIUM-ALBUTEROL 0.5-2.5 (3) MG/3ML IN SOLN
3.0000 mL | Freq: Four times a day (QID) | RESPIRATORY_TRACT | Status: AC
  Administered 2020-01-30: 08:00:00 3 mL via RESPIRATORY_TRACT
  Filled 2020-01-29: qty 3

## 2020-01-29 NOTE — TOC Progression Note (Addendum)
Transition of Care [facility]) - Progression Note    Patient Details  Name: [name] MRN: [id number] Date of Birth: 04/13/1950  Transition of Care [facility]) CM/SW Contact  Izola Price, RN Phone Number: 01/29/2020, 9:37 AM  Clinical Narrative:    Lorie Apley at [facility] in anticipation of potential dc in 2-3 days. [facility] does not service that area in [facility]. Home oxygen has been set up but will continue seeking [facility] acceptance. Simmie Davies RN CM  [facility] and [facility] cannot accept this patient. Partially related to location. [place] checked with the [facility] office as well. Simmie Davies RN CM    Expected Discharge Plan: [place] Barriers to Discharge: Continued Medical Work up  Expected Discharge Plan and Services Expected Discharge Plan: [place] In-house Referral: NA   Post Acute Care Choice: [place] arrangements for the past 2 months: Single Family Home                 DME Arranged: Oxygen DME Agency: [employer]                   Social Determinants of Health (SDOH) Interventions    Readmission Risk Interventions Readmission Risk Prevention Plan 01/28/2020 09/09/2019 06/14/2019  Transportation Screening Complete Complete Complete  PCP or Specialist Appt within 5-7 Days - - -  PCP or Specialist Appt within 3-5 Days - Complete Complete  Home Care Screening - - -  Medication Review (RN CM) - - -  [place] or [place] - Complete Complete  Social Work Consult for [place] Planning/Counseling - Complete Complete  Palliative Care Screening - Not Applicable Not Applicable  Medication Review Press photographer) (No Data) Complete Complete  PCP or Specialist appointment within 3-5 days of discharge Complete - -  [place] or Home Care Consult Complete - -  SW Recovery Care/Counseling Consult Complete - -  Palliative Care Screening Not Applicable - -  [place] Not Applicable - -  Some  recent data might be hidden

## 2020-01-29 NOTE — Progress Notes (Signed)
[name]  Pt ran for 3.5 hrs, with 50 gm albumin for bp support. Only able to pull 1 kg of fluid.  Vitals pre tx: bp 92/69, hr 92 , t 98.2  Vital post tx: bp 116/57, hr 100, t 97.5  [name]

## 2020-01-29 NOTE — Consult Note (Signed)
[place] for Warfarin/Heparin Indication: afib s/p TAVR.   Allergies  Allergen Reactions  . Buspirone Other (See Comments)    Weakness  . Diltiazem Hcl Palpitations  . Gabapentin Palpitations  . Hydralazine Rash  . Lisinopril Cough  . Lovastatin Palpitations  . Metformin Diarrhea  . Pravastatin Other (See Comments)    Insomnia  . Sitagliptin Other (See Comments)    Constipation  . Tramadol Itching   Patient Measurements: Height: 5\' 3"  (160 cm) Weight: 107.2 kg (236 lb 4.8 oz) IBW/kg (Calculated) : 52.4  Vital Signs: Temp: 97.6 F (36.4 C) (12/11 0418) Temp Source: Oral (12/11 0418) BP: 104/73 (12/11 0418) Pulse Rate: 107 (12/11 0418)  Labs: Recent Labs    01/26/20 2248 01/27/20 0526 01/27/20 0829 01/27/20 1728 01/28/20 0534 01/29/20 0322  HGB   < >  --  9.3*  --  9.6* 10.3*  HCT  --   --  32.4*  --  32.4* 33.8*  PLT  --   --  147*  --  167 191  LABPROT  --  14.3  --   --  14.1 15.3*  INR  --  1.2  --   --  1.1 1.3*  HEPARINUNFRC  --   --  0.52 0.37 0.40 0.52  CREATININE  --  5.38* 5.75*  --  6.95* 4.25*   < > = values in this interval not displayed.   Estimated Creatinine Clearance: 14.7 mL/min (A) (by C-G formula based on SCr of 4.25 mg/dL (H)).  Medical History: Past Medical History:  Diagnosis Date  . Anemia   . Anxiety   . Aortic valve stenosis   . Arrhythmia    atrial fibrillation  . Arthritis    feet, legs  . B12 deficiency   . Bowel obstruction ([place])   . CHF (congestive heart failure) ([place])   . CKD (chronic kidney disease)    protein in urine  . Colostomy in place [facility])   . Diabetes mellitus without complication ([place])   . Dialysis patient [facility])    T/Th/Sa  . Diverticulitis large intestine   . Dysrhythmia   . Ectopic atrial tachycardia ([place])   . FSGS (focal segmental glomerulosclerosis)   . Gastritis   . GI bleed   . Heart murmur   . Hyperlipidemia   . Hypertension   . Hypothyroidism   .  Hypothyroidism    unspecified  . MRSA (methicillin resistant Staphylococcus aureus)    at abdominal wound.  Jan 2017.  Treated.   . Neuropathy   . Neuropathy in diabetes ([place])   . Obesity   . Pedal edema   . Psoriasis   . Shortness of breath dyspnea   . Sleep apnea    CPAP  . Vertigo    Medications:  Warfarin 1.5mg  daily; last dose was 12/4 - pharmacy tech confirmed dose with patient's daughter  Assessment: 69yo female with a past medical history of anemia, anxiety, diabetes, CHF, CKD ESRD (hemodialysis MWF), hypertension, obesity, presents to the emergency department for shortness of breath. Patient was on Eliquis in the past and was switched to warfarin around October 2021. Pharmacy has been consulted for warfarin dosing and monitoring for afib and s/p TAVR (2019). Patient [facility] at the [facility]. Spoke with patient's daughter - she stated patient was previously on warfarin 2mg  and was changed to 1mg  around 12/19/19 due to having darker stools, then patient was recently changed to warfarin 1.5mg  12/2. CHADSVASc of 5. Plan  to bridge warfarin with heparin gtt. Warfarin is not dialyzed.   DDI: amiodarone and levothyroxine    12/6 2255 HL 0.14  increase Heparin to 1200 units/hr  12/7 0704 HL 0.22 1000 unit bolus and increase heparin infusion to 1350 units/hr.  12/8 0409 HL 0.22 1000 unit bolus and increase heparin infusion to 1550 units/hr 12/8 1356 HL 0.41 12/8 2248 HL 0.11 increase heparin infusion to 1700 units/hr. Seem to be a lab drawn error.  12/9 0829 HL 0.52, therapeutic x 1 12/9 1728 HL 0.37, therapeutic x 2 12/10 0534 HL 0.40, therapeutic x 3 12/11 0322 HL 0.52, therapeutic x 4  Date INR Dose 12/5 1.1 Not given 12/6  1.1 3 mg  12/7 1.1 3 mg - not charted given.  12/8 1.1 5 mg - not charted given  12/9 1.2 5 mg 12/10  1.1 5 mg - not charted until 12/11 @ 0008 12/11 1.3 5mg    Goal of Therapy:  INR 2-3 for warfarin  Heparin level 0.3 - 0.7.  Monitor platelets  by anticoagulation protocol: Yes   Plan:  Heparin:   Heparin level is therapeutic. Will continue heparin infusion at 1700 units/hr. Recheck heparin level with AM labs. CBC daily while on heparin.   Warfarin:  INR subtherapeutic. Will repeat warfarin 5mg  tonight x 1. Daily INR ordered. Late administration following dialysis did not provide sufficient time prior to INR being drawn.  Renda Rolls, PharmD, [facility] 01/29/2020 6:53 AM

## 2020-01-29 NOTE — Progress Notes (Signed)
NURSING PROGRESS NOTE  [name] [id number] Admission Data: 01/29/2020 7:40 AM Attending Provider: Gwynne Edinger, MD EBR:AXENM, Wright, MD Code Status: Full  [name] is a 69 y.o. female patient admitted from ED:  -No acute distress noted.  -No complaints of shortness of breath.  -No complaints of chest pain.   Cardiac Monitoring: Box # 35in place. Cardiac monitor yields:atrial fibrillation, with ventricular rate of 107.  Blood pressure 104/73, pulse (!) 107, temperature 97.6 F (36.4 C), temperature source Oral, resp. rate 15, height 5\' 3"  (1.6 m), weight 107.2 kg, SpO2 100 %.   IV Fluids:  IV in place, occlusive dsg intact without redness, IV cath forearm right, condition patent and no redness Heparin at 17  Allergies:  Buspirone, Diltiazem hcl, Gabapentin, Hydralazine, Lisinopril, Lovastatin, Metformin, Pravastatin, Sitagliptin, and Tramadol  Past Medical History:   has a past medical history of Anemia, Anxiety, Aortic valve stenosis, Arrhythmia, Arthritis, B12 deficiency, Bowel obstruction (HCC), CHF (congestive heart failure) ([place]), CKD (chronic kidney disease), Colostomy in place [facility]), Diabetes mellitus without complication ([place]), Dialysis patient ([place]), Diverticulitis large intestine, Dysrhythmia, Ectopic atrial tachycardia ([place]), FSGS (focal segmental glomerulosclerosis), Gastritis, GI bleed, Heart murmur, Hyperlipidemia, Hypertension, Hypothyroidism, Hypothyroidism, MRSA (methicillin resistant Staphylococcus aureus), Neuropathy, Neuropathy in diabetes ([place]), Obesity, Pedal edema, Psoriasis, Shortness of breath dyspnea, Sleep apnea, and Vertigo.  Past Surgical History:   has a past surgical history that includes Colectomy; laparotomy closure of cecal perforation (05/09/2013); Tonsillectomy; Back surgery; Cardiac catheterization; Abdominal hysterectomy; Debridement of abdominal wall abscess (N/A, 02/22/2015); Excision mass abdominal (N/A, 02/24/2015); Application if  wound vac (N/A, 02/24/2015); Excision mass abdominal (N/A, 02/26/2015); Application if wound vac (N/A, 02/26/2015); Wound debridement (N/A, 03/03/2015); Wound debridement (N/A, 03/09/2015); Flexible sigmoidoscopy (06/19/2015); Colostomy reversal (N/A, 07/12/2015); laparotomy (07/12/2015); laparoscopy (07/12/2015); Lysis of adhesion (07/12/2015); laparotomy (N/A, 02/06/2015); Colonoscopy with propofol (N/A, 09/04/2016); Cardiac valve replacement (08/2017); Cardioversion (N/A, 07/15/2018); DIALYSIS/PERMA CATHETER INSERTION (N/A, 10/20/2018); Cataract extraction w/PHACO (Right, 11/16/2018); Cataract extraction w/PHACO (Left, 12/07/2018); Cholecystectomy; Hernia repair; AV fistula placement (Left, 01/29/2019); DIALYSIS/PERMA CATHETER REMOVAL (N/A, 08/03/2019); and A/V SHUNT INTERVENTION (N/A, 01/25/2020).  Social History:   reports that she quit smoking about 6 years ago. Her smoking use included cigarettes. She has never used smokeless tobacco. She reports that she does not drink alcohol and does not use drugs.  Skin: Clean, dry and intact. Bilateral ecchymosis on bilateral arms. Moisture associationbetween skin folds    Patient/Family orientated to room. Information packet given to patient/family. Admission inpatient armband information verified with patient/family to include name and date of birth and placed on patient arm. Side rails up x 2, fall assessment and education completed with patient/family. Patient/family able to verbalize understanding of risk associated with falls and verbalized understanding to call for assistance before getting out of bed. Call light within reach. Patient/family able to voice and demonstrate understanding of unit orientation instructions.    Will continue to evaluate and treat per MD orders.

## 2020-01-29 NOTE — Progress Notes (Signed)
PROGRESS NOTE    [name]  FHL:[id number] DOB: 1951/01/15 DOA: 01/23/2020 PCP: Adin Hector, MD   Brief Narrative: Taken from H&P  [name] is a 69 y.o. female with medical history significant for insulin-dependent diabetes mellitus type 2, heart failure with reduced ejection fraction, end-stage renal disease on hemodialysis, obstructive sleep apnea not wearing CPAP, hypertension, aortic atherosclerosis, persistent atrial fibrillation, status post TAVR, morbid obesity, acquired hypothyroid, history of focal segmental glomerulosclerosis, presented to the emergency department via EMS for chief concerns of shortness of breath.   Shortness of breath that started progressively worsened throughout the day 01/22/20. Daughter denies fever, nausea, vomiting, chest pain, abdominal pain, dysuria, hematuria, diarrhea, blood in her stool. Patient and daughter states that she used her home oxygen without much relief.   Family endorses new increased productive cough with yellow mucus throughout the day. Endorses increased swelling in her legs.   Patient has frequent recent hospitalizations at [facility] with similar symptoms.  Also found to have reduced EF to 25% on repeat echo and a valvular thrombus.  Her Eliquis was transitioned to warfarin.  Also recent GI bleed with aspirin and warfarin, initially discontinue both and then later started on warfarin only. She was also recommended to have dialysis 4 times a week to prevent volume overload.  Hardly makes any urine, volume is being managed with dialysis.  Cardiology was consulted.  Subjective: Continues w/ dyspnea but up in bed, dyspnea somewhat improved, o2 has been weaned down. Fatigued. Has appetite.   Assessment & Plan:   Principal Problem:   Acute hypoxemic respiratory failure (HCC) Active Problems:   Obesity   Anemia   Benign essential HTN   Acquired atrophy of thyroid   FSGS (focal segmental glomerulosclerosis)   Acute kidney injury  superimposed on chronic kidney disease (HCC)   S/P TAVR (transcatheter aortic valve replacement)   DM type 2 with diabetic peripheral neuropathy (HCC)   Acute respiratory failure with hypoxia (HCC)   DDD (degenerative disc disease), lumbosacral   OSA (obstructive sleep apnea)  Acute on chronic hypoxic respiratory failure.  Most likely cardiogenic pulmonary edema/volume overload secondary volume overload from inadequate dialysis, complicated by hfref. Makes minimal urine. On 12/7 required angioplasty of brachiocephalic fistula. Baseline 3 L [place] O2, currently weaned down to 4.5 L. Recent echocardiogram with worsening of her EF to 25%, she also had right and left cardiac catheterization recently done at [facility]. 500 ml ultrafiltrated yesterday -Volume is being managed with dialysis, plan for extra session today -Might need more frequent dialysis to prevent recurrent hospitalizations with similar symptoms. - will schedule duonebs for today but think patient's pulmonary symptoms fluid-mediated -Continue with supportive care.  HFrEF  Most recent EF 20%. -Cardiology was consulted-appreciate their recommendations -defer institution of evidence-based meds to cardiology. They are advising a conservative approach - volume mgmt via dialysis  ESRD on HD(MWF).  Per patient she is quite compliant with her dialysis. 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 Fistula is deep complicating access. - nephrology will give extra dialysis session today.  Persistent A. fib.  She was previously on Eliquis which was transitioned to warfarin due to history of aortic valve thrombosis. Normal HR. TSH wnl. -Continue with amiodarone -Continue warfarin per pharmacy, on heparin until INR therapeutic (watch platelets while on heparin, 147 today)  Hyperkalemia Resolved w/ dialysis  Hyponatremia 2/2 inadequate dialysis, improved to 133 today with dialysis yesterday - cont dialysis  History of chronic  pain. -Continue home oxycodone  Aortic stenosis s/p TAVR.  History of recent aortic valve thrombosis. -Continue anticoagulation as above  Insulin-dependent diabetes mellitus. Well controlled, a1c 6.8 08/2019 -Continue SSI, decreased home lantus to 35   Obstructive sleep apnea.  She is unable to tolerate auto CPAP. She will need outpatient pulmonology referral for further recommendations.  Hypothyroidism. -Continue home Synthroid.  Constipation - Standing miralax/senna, increased miralax dose to 54 daily, added lactulose prn  Debility - PT/OT consulted  Morbid obesity. Body mass index is 41.86 kg/m.  This will complicate overall prognosis.  Objective: Vitals:   01/29/20 0000 01/29/20 0418 01/29/20 0754 01/29/20 1207  BP: 111/89 104/73 (!) 145/67 96/70  Pulse: 97 (!) 107 99 97  Resp: 16 15 18 16   Temp: 98 F (36.7 C) 97.6 F (36.4 C) 98.1 F (36.7 C) 98 F (36.7 C)  TempSrc: Oral Oral Oral   SpO2: 100% 100% 99% 100%  Weight:      Height:        Intake/Output Summary (Last 24 hours) at 01/29/2020 1410 Last data filed at 01/29/2020 1354 Gross per 24 hour  Intake 410 ml  Output 500 ml  Net -90 ml   Filed Weights   01/26/20 0305 01/27/20 0500 01/28/20 0427  Weight: 106.9 kg 106.8 kg 107.2 kg    Examination:  General.  Morbidly obese lady, mildly distressed. Pulmonary.  Bilateral basal crackles, exp wheezes CV.  Regular rate and rhythm, no JVD, rub or murmur. Abdomen.  Soft, nontender, nondistended, BS positive. Morbidly obese CNS.  Alert and oriented x3.  No focal neurologic deficit. Extremities.  No edema, no cyanosis, pulses intact and symmetrical. Psychiatry.  Judgment and insight appears normal.  DVT prophylaxis: Coumadin/heparin Code Status: Full Family Communication: Discussed with patient Disposition Plan:  Status is: Inpatient  Remains inpatient appropriate because:Inpatient level of care appropriate due to severity of illness   Dispo: The  patient is from: Home              Anticipated d/c is to: Home (patient declines SNF) with home health              Anticipated d/c date is: 2-5 days              Patient currently is not medically stable to d/c.   Consultants:   Cardiology  Nephrology  Procedures:  Antimicrobials:   Data Reviewed: I have personally reviewed following labs and imaging studies  CBC: Recent Labs  Lab 01/25/20 0704 01/26/20 0409 01/27/20 0829 01/28/20 0534 01/29/20 0322  WBC 10.6* 10.0 7.1 7.2 8.6  HGB 10.9* 9.9* 9.3* 9.6* 10.3*  HCT 36.8 34.0* 32.4* 32.4* 33.8*  MCV 100.5* 99.7 100.3* 99.4 96.8  PLT 182 156 147* 167 222   Basic Metabolic Panel: Recent Labs  Lab 01/24/20 0626 01/26/20 1121 01/27/20 0526 01/27/20 0829 01/28/20 0534 01/29/20 0322  NA 136 131* 133* 131* 129* 133*  K 5.6* 5.3* 4.9 4.8 4.5 4.3  CL 97* 92* 93* 93* 90* 95*  CO2 28 26 27 27 26 28   GLUCOSE 170* 132* 74 59* 123* 149*  BUN 28* 40* 39* 42* 50* 20  CREATININE 4.90* 5.66* 5.38* 5.75* 6.95* 4.25*  CALCIUM 9.2 8.2* 8.2* 8.1* 8.2* 8.2*  PHOS 5.4*  --  5.0* 5.7* 6.0*  --    GFR: Estimated Creatinine Clearance: 14.7 mL/min (A) (by C-G formula based on SCr of 4.25 mg/dL (H)). Liver Function Tests: Recent Labs  Lab 01/23/20 1143 01/27/20 0829  AST 22  --  ALT 18  --   ALKPHOS 58  --   BILITOT 0.9  --   PROT 7.5  --   ALBUMIN 3.8 3.3*   No results for input(s): LIPASE, AMYLASE in the last 168 hours. No results for input(s): AMMONIA in the last 168 hours. Coagulation Profile: Recent Labs  Lab 01/25/20 0704 01/26/20 0409 01/27/20 0526 01/28/20 0534 01/29/20 0322  INR 1.1 1.1 1.2 1.1 1.3*   Cardiac Enzymes: No results for input(s): CKTOTAL, CKMB, CKMBINDEX, TROPONINI in the last 168 hours. BNP (last 3 results) No results for input(s): PROBNP in the last 8760 hours. HbA1C: No results for input(s): HGBA1C in the last 72 hours. CBG: Recent Labs  Lab 01/28/20 1613 01/28/20 2112 01/28/20 2309  01/29/20 0752 01/29/20 1208  GLUCAP 144* 72 177* 118* 103*   Lipid Profile: No results for input(s): CHOL, HDL, LDLCALC, TRIG, CHOLHDL, LDLDIRECT in the last 72 hours. Thyroid Function Tests: Recent Labs    01/28/20 0534  TSH 1.553   Anemia Panel: No results for input(s): VITAMINB12, FOLATE, FERRITIN, TIBC, IRON, RETICCTPCT in the last 72 hours. Sepsis Labs: No results for input(s): PROCALCITON, LATICACIDVEN in the last 168 hours.  Recent Results (from the past 240 hour(s))  Resp Panel by RT-PCR (Flu A&B, Covid) Nasopharyngeal Swab     Status: None   Collection Time: 01/23/20 11:43 AM   Specimen: Nasopharyngeal Swab; Nasopharyngeal(NP) swabs in vial transport medium  Result Value Ref Range Status   SARS Coronavirus 2 by RT PCR NEGATIVE NEGATIVE Final    Comment: (NOTE) SARS-CoV-2 target nucleic acids are NOT DETECTED.  The SARS-CoV-2 RNA is generally detectable in upper respiratory specimens during the acute phase of infection. The lowest concentration of SARS-CoV-2 viral copies this assay can detect is 138 copies/mL. A negative result does not preclude SARS-Cov-2 infection and should not be used as the sole basis for treatment or other patient management decisions. A negative result may occur with  improper specimen collection/handling, submission of specimen other than nasopharyngeal swab, presence of viral mutation(s) within the areas targeted by this assay, and inadequate number of viral copies(<138 copies/mL). A negative result must be combined with clinical observations, patient history, and epidemiological information. The expected result is Negative.  Fact Sheet for Patients:  EntrepreneurPulse.com.au  Fact Sheet for Healthcare Providers:  IncredibleEmployment.be  This test is no t yet approved or cleared by the [place] FDA and  has been authorized for detection and/or diagnosis of SARS-CoV-2 by FDA under an Emergency  Use Authorization (EUA). This EUA will remain  in effect (meaning this test can be used) for the duration of the COVID-19 declaration under Section 564(b)(1) of the Act, 21 U.S.C.section 360bbb-3(b)(1), unless the authorization is terminated  or revoked sooner.       Influenza A by PCR NEGATIVE NEGATIVE Final   Influenza B by PCR NEGATIVE NEGATIVE Final    Comment: (NOTE) The Xpert Xpress SARS-CoV-2/FLU/RSV plus assay is intended as an aid in the diagnosis of influenza from Nasopharyngeal swab specimens and should not be used as a sole basis for treatment. Nasal washings and aspirates are unacceptable for Xpert Xpress SARS-CoV-2/FLU/RSV testing.  Fact Sheet for Patients: EntrepreneurPulse.com.au  Fact Sheet for Healthcare Providers: IncredibleEmployment.be  This test is not yet approved or cleared by the [place] FDA and has been authorized for detection and/or diagnosis of SARS-CoV-2 by FDA under an Emergency Use Authorization (EUA). This EUA will remain in effect (meaning this test can be used) for the duration of  the COVID-19 declaration under Section 564(b)(1) of the Act, 21 U.S.C. section 360bbb-3(b)(1), unless the authorization is terminated or revoked.  Performed at [facility], [address] [place], [place] [address]      Radiology Studies: DG Chest 2 View  Result Date: 01/28/2020 CLINICAL DATA:  Shortness of breath EXAM: CHEST - 2 VIEW COMPARISON:  01/23/2020, 09/07/2019 FINDINGS: Clips over the left axillary region. Surgical hardware in the cervical spine. Cardiomegaly with mild central vascular congestion. Minimal right perihilar opacity. Aortic valve prosthesis. IMPRESSION: 1. Cardiomegaly with mild central congestion. 2. Minimal right perihilar opacity, atelectasis versus minimal infiltrate. Electronically Signed   By: Donavan Foil M.D.   On: 01/28/2020 21:01    Scheduled Meds: . amiodarone  200 mg Oral  Daily  . atorvastatin  40 mg Oral Daily  . Chlorhexidine Gluconate Cloth  6 each Topical Q0600  . DULoxetine  30 mg Oral Daily  . epoetin (EPOGEN/PROCRIT) injection  4,000 Units Intravenous Q M,W,F-HD  . guaiFENesin  600 mg Oral BID  . insulin aspart  0-6 Units Subcutaneous TID WC  . insulin glargine  17 Units Subcutaneous QHS  . ipratropium-albuterol  3 mL Nebulization Q6H  . levothyroxine  88 mcg Oral QAC breakfast  . losartan  25 mg Oral Daily  . midodrine  10 mg Oral Q M,W,F-HD  . polyethylene glycol  51 g Oral Daily  . senna-docusate  1 tablet Oral QHS  . Vitamin D (Ergocalciferol)  50,000 Units Oral Q Tue  . warfarin  5 mg Oral ONCE-1600  . Warfarin - Pharmacist Dosing Inpatient   Does not apply q1600   Continuous Infusions: . sodium chloride    . sodium chloride    . heparin 1,700 Units/hr (01/28/20 1439)     LOS: 5 days   Time spent: 25 minutes.  Desma Maxim, MD [facility]  If 7PM-7AM, please contact night-coverage [email or web address]  01/29/2020, 2:10 PM

## 2020-01-29 NOTE — Progress Notes (Signed)
Warfarin 5MG  PO given now. Night pharmacist called and informed me that medication wasn't given on day shift . Patient was in HD session at that time.

## 2020-01-29 NOTE — Plan of Care (Signed)

## 2020-01-29 NOTE — Progress Notes (Signed)
[name]  ROUNDING NOTE   Subjective:   Patient reports to continue to feel short of breath and chest congestion on 5L [place].  She reports she did not feel much better after HD yesterday, UF goal 1.5. She feels like she may benefit from an additional treatment today.    Objective:  Vital signs in last 24 hours:  Temp:  [97.6 F (36.4 C)-98.2 F (36.8 C)] 98.1 F (36.7 C) (12/11 0754) Pulse Rate:  [85-114] 99 (12/11 0754) Resp:  [14-19] 18 (12/11 0754) BP: (87-145)/(43-115) 145/67 (12/11 0754) SpO2:  [98 %-100 %] 99 % (12/11 0754)  Weight change:  Filed Weights   01/26/20 0305 01/27/20 0500 01/28/20 0427  Weight: 106.9 kg 106.8 kg 107.2 kg    Intake/Output: I/O last 3 completed shifts: In: 650 [P.O.:480; I.V.:170] Out: 500 [Other:500]   Intake/Output this shift:  No intake/output data recorded.  Physical Exam: General: NAD   Head: Normocephalic, atraumatic. Moist oral mucosal membranes  Eyes: Anicteric, PERRL  Neck: Supple, trachea midline  Lungs:  +Crackles , on 5L nasal canula   Heart: Regular rate and rhythm  Abdomen:  Soft, nontender,   Extremities:  No peripheral edema.  Neurologic: Nonfocal, moving all four extremities  Skin: No lesions  Access: LUA AVF     Basic Metabolic Panel: Recent Labs  Lab 01/24/20 0626 01/26/20 1121 01/27/20 0526 01/27/20 0829 01/28/20 0534 01/29/20 0322  NA 136 131* 133* 131* 129* 133*  K 5.6* 5.3* 4.9 4.8 4.5 4.3  CL 97* 92* 93* 93* 90* 95*  CO2 28 26 27 27 26 28   GLUCOSE 170* 132* 74 59* 123* 149*  BUN 28* 40* 39* 42* 50* 20  CREATININE 4.90* 5.66* 5.38* 5.75* 6.95* 4.25*  CALCIUM 9.2 8.2* 8.2* 8.1* 8.2* 8.2*  PHOS 5.4*  --  5.0* 5.7* 6.0*  --     Liver Function Tests: Recent Labs  Lab 01/23/20 1143 01/27/20 0829  AST 22  --   ALT 18  --   ALKPHOS 58  --   BILITOT 0.9  --   PROT 7.5  --   ALBUMIN 3.8 3.3*   No results for input(s): LIPASE, AMYLASE in the last 168 hours. No results for  input(s): AMMONIA in the last 168 hours.  CBC: Recent Labs  Lab 01/25/20 0704 01/26/20 0409 01/27/20 0829 01/28/20 0534 01/29/20 0322  WBC 10.6* 10.0 7.1 7.2 8.6  HGB 10.9* 9.9* 9.3* 9.6* 10.3*  HCT 36.8 34.0* 32.4* 32.4* 33.8*  MCV 100.5* 99.7 100.3* 99.4 96.8  PLT 182 156 147* 167 191    Cardiac Enzymes: No results for input(s): CKTOTAL, CKMB, CKMBINDEX, TROPONINI in the last 168 hours.  BNP: Invalid input(s): POCBNP  CBG: Recent Labs  Lab 01/28/20 1211 01/28/20 1613 01/28/20 2112 01/28/20 2309 01/29/20 0752  GLUCAP 79 144* 72 177* 118*    Microbiology: Results for orders placed or performed during the hospital encounter of 01/23/20  Resp Panel by RT-PCR (Flu A&B, Covid) Nasopharyngeal Swab     Status: None   Collection Time: 01/23/20 11:43 AM   Specimen: Nasopharyngeal Swab; Nasopharyngeal(NP) swabs in vial transport medium  Result Value Ref Range Status   SARS Coronavirus 2 by RT PCR NEGATIVE NEGATIVE Final    Comment: (NOTE) SARS-CoV-2 target nucleic acids are NOT DETECTED.  The SARS-CoV-2 RNA is generally detectable in upper respiratory specimens during the acute phase of infection. The lowest concentration of SARS-CoV-2 viral copies this assay can detect is 138 copies/mL. A negative  result does not preclude SARS-Cov-2 infection and should not be used as the sole basis for treatment or other patient management decisions. A negative result may occur with  improper specimen collection/handling, submission of specimen other than nasopharyngeal swab, presence of viral mutation(s) within the areas targeted by this assay, and inadequate number of viral copies(<138 copies/mL). A negative result must be combined with clinical observations, patient history, and epidemiological information. The expected result is Negative.  Fact Sheet for Patients:  EntrepreneurPulse.com.au  Fact Sheet for Healthcare Providers:   IncredibleEmployment.be  This test is no t yet approved or cleared by the [place] FDA and  has been authorized for detection and/or diagnosis of SARS-CoV-2 by FDA under an Emergency Use Authorization (EUA). This EUA will remain  in effect (meaning this test can be used) for the duration of the COVID-19 declaration under Section 564(b)(1) of the Act, 21 U.S.C.section 360bbb-3(b)(1), unless the authorization is terminated  or revoked sooner.       Influenza A by PCR NEGATIVE NEGATIVE Final   Influenza B by PCR NEGATIVE NEGATIVE Final    Comment: (NOTE) The Xpert Xpress SARS-CoV-2/FLU/RSV plus assay is intended as an aid in the diagnosis of influenza from Nasopharyngeal swab specimens and should not be used as a sole basis for treatment. Nasal washings and aspirates are unacceptable for Xpert Xpress SARS-CoV-2/FLU/RSV testing.  Fact Sheet for Patients: EntrepreneurPulse.com.au  Fact Sheet for Healthcare Providers: IncredibleEmployment.be  This test is not yet approved or cleared by the [place] FDA and has been authorized for detection and/or diagnosis of SARS-CoV-2 by FDA under an Emergency Use Authorization (EUA). This EUA will remain in effect (meaning this test can be used) for the duration of the COVID-19 declaration under Section 564(b)(1) of the Act, 21 U.S.C. section 360bbb-3(b)(1), unless the authorization is terminated or revoked.  Performed at [facility], [address] [place], [place] [address]     Coagulation Studies: Recent Labs    01/27/20 0526 01/28/20 0534 01/29/20 0322  LABPROT 14.3 14.1 15.3*  INR 1.2 1.1 1.3*    Urinalysis: No results for input(s): COLORURINE, LABSPEC, PHURINE, GLUCOSEU, HGBUR, BILIRUBINUR, KETONESUR, PROTEINUR, UROBILINOGEN, NITRITE, LEUKOCYTESUR in the last 72 hours.  Invalid input(s): APPERANCEUR    Imaging: DG Chest 2 View  Result Date:  01/28/2020 CLINICAL DATA:  Shortness of breath EXAM: CHEST - 2 VIEW COMPARISON:  01/23/2020, 09/07/2019 FINDINGS: Clips over the left axillary region. Surgical hardware in the cervical spine. Cardiomegaly with mild central vascular congestion. Minimal right perihilar opacity. Aortic valve prosthesis. IMPRESSION: 1. Cardiomegaly with mild central congestion. 2. Minimal right perihilar opacity, atelectasis versus minimal infiltrate. Electronically Signed   By: Donavan Foil M.D.   On: 01/28/2020 21:01     Medications:   . sodium chloride    . sodium chloride    . heparin 1,700 Units/hr (01/28/20 1439)   . amiodarone  200 mg Oral Daily  . atorvastatin  40 mg Oral Daily  . Chlorhexidine Gluconate Cloth  6 each Topical Q0600  . DULoxetine  30 mg Oral Daily  . epoetin (EPOGEN/PROCRIT) injection  4,000 Units Intravenous Q M,W,F-HD  . guaiFENesin  600 mg Oral BID  . insulin aspart  0-6 Units Subcutaneous TID WC  . insulin glargine  17 Units Subcutaneous QHS  . levothyroxine  88 mcg Oral QAC breakfast  . losartan  25 mg Oral Daily  . midodrine  10 mg Oral Q M,W,F-HD  . polyethylene glycol  51 g Oral Daily  .  senna-docusate  1 tablet Oral QHS  . Vitamin D (Ergocalciferol)  50,000 Units Oral Q Tue  . warfarin  5 mg Oral ONCE-1600  . Warfarin - Pharmacist Dosing Inpatient   Does not apply q1600   sodium chloride, sodium chloride, alteplase, heparin, ipratropium-albuterol, lactulose, lidocaine (PF), lidocaine-prilocaine, ondansetron **OR** ondansetron (ZOFRAN) IV, oxyCODONE-acetaminophen **AND** oxyCODONE, pentafluoroprop-tetrafluoroeth  Assessment/ Plan:  [name] is a 69 y.o.  female  with medical problems including Diabetes Type 2, HF with EF of 45%, ESRD on HD MWF, presented to the ED on 01/22/2020 for SOB.  [facility]/ [facility]/ MWF/ LUE AVF . EDW 108   1. ESRD on HD with fluid overload  - Will plan for HD again today, sequential treatment with UF goal of 3.5L  - outpatient dry  weight will likely need to be decreased  2. Hyperkalemia - resolved, K 4.3   3. Hyponatremia  - improved from yesterday. 129 > 133   4. Anemia of CKD  - on epo with HD   5. Secondary Hyperparathyroidism  - on calcium acetate as an outpatient   6. DM type 2 with CKD  - continue current antihyperglycemic regimen     LOS: [place] 12/11/202110:46 AM

## 2020-01-30 ENCOUNTER — Inpatient Hospital Stay: Payer: Medicare Other

## 2020-01-30 LAB — BASIC METABOLIC PANEL
Anion gap: 9 (ref 5–15)
BUN: 14 mg/dL (ref 8–23)
CO2: 31 mmol/L (ref 22–32)
Calcium: 8.6 mg/dL — ABNORMAL LOW (ref 8.9–10.3)
Chloride: 93 mmol/L — ABNORMAL LOW (ref 98–111)
Creatinine, Ser: 3.65 mg/dL — ABNORMAL HIGH (ref 0.44–1.00)
GFR, Estimated: 13 mL/min — ABNORMAL LOW (ref >60)
Glucose, Bld: 108 mg/dL — ABNORMAL HIGH (ref 70–99)
Potassium: 3.9 mmol/L (ref 3.5–5.1)
Sodium: 133 mmol/L — ABNORMAL LOW (ref 135–145)

## 2020-01-30 LAB — CBC
HCT: 32.3 % — ABNORMAL LOW (ref 36.0–46.0)
Hemoglobin: 9.3 g/dL — ABNORMAL LOW (ref 12.0–15.0)
MCH: 28.7 pg (ref 26.0–34.0)
MCHC: 28.8 g/dL — ABNORMAL LOW (ref 30.0–36.0)
MCV: 99.7 fL (ref 80.0–100.0)
Platelets: 167 10*3/uL (ref 150–400)
RBC: 3.24 MIL/uL — ABNORMAL LOW (ref 3.87–5.11)
RDW: 15.5 % (ref 11.5–15.5)
WBC: 9 10*3/uL (ref 4.0–10.5)
nRBC: 0.3 % — ABNORMAL HIGH (ref 0.0–0.2)

## 2020-01-30 LAB — PROTIME-INR
INR: 1.5 — ABNORMAL HIGH (ref 0.8–1.2)
Prothrombin Time: 17.8 seconds — ABNORMAL HIGH (ref 11.4–15.2)

## 2020-01-30 LAB — GLUCOSE, CAPILLARY
Glucose-Capillary: 97 mg/dL (ref 70–99)
Glucose-Capillary: 85 mg/dL (ref 70–99)
Glucose-Capillary: 86 mg/dL (ref 70–99)
Glucose-Capillary: 136 mg/dL — ABNORMAL HIGH (ref 70–99)

## 2020-01-30 LAB — HEPARIN LEVEL (UNFRACTIONATED)
Heparin Unfractionated: <0.1 IU/mL — ABNORMAL LOW (ref 0.30–0.70)
Heparin Unfractionated: 0.21 IU/mL — ABNORMAL LOW (ref 0.30–0.70)

## 2020-01-30 MED ORDER — SODIUM CHLORIDE 0.9 % IV SOLN
1.0000 g | INTRAVENOUS | Status: DC
  Administered 2020-01-30 – 2020-01-31 (×2): 1 g via INTRAVENOUS
  Filled 2020-01-30 (×2): qty 10
  Filled 2020-01-30: qty 1
  Filled 2020-01-30: qty 10

## 2020-01-30 MED ORDER — AZITHROMYCIN 500 MG PO TABS
250.0000 mg | ORAL_TABLET | Freq: Every day | ORAL | Status: DC
  Administered 2020-01-30 – 2020-02-01 (×3): 250 mg via ORAL
  Filled 2020-01-30 (×3): qty 1

## 2020-01-30 MED ORDER — HEPARIN (PORCINE) 25000 UT/250ML-% IV SOLN
2050.0000 [IU]/h | INTRAVENOUS | Status: DC
  Administered 2020-01-30: 22:00:00 2050 [IU]/h via INTRAVENOUS
  Administered 2020-01-30: 09:00:00 1900 [IU]/h via INTRAVENOUS
  Filled 2020-01-30 (×2): qty 250

## 2020-01-30 MED ORDER — WARFARIN SODIUM 6 MG PO TABS
7.0000 mg | ORAL_TABLET | Freq: Once | ORAL | Status: AC
  Administered 2020-01-30: 17:00:00 7 mg via ORAL
  Filled 2020-01-30: qty 1

## 2020-01-30 MED ORDER — HEPARIN BOLUS VIA INFUSION
2000.0000 [IU] | Freq: Once | INTRAVENOUS | Status: AC
  Administered 2020-01-30: 09:00:00 2000 [IU] via INTRAVENOUS
  Filled 2020-01-30: qty 2000

## 2020-01-30 MED ORDER — HEPARIN BOLUS VIA INFUSION
1200.0000 [IU] | Freq: Once | INTRAVENOUS | Status: AC
  Administered 2020-01-30: 17:00:00 1200 [IU] via INTRAVENOUS
  Filled 2020-01-30: qty 1200

## 2020-01-30 NOTE — Progress Notes (Signed)
[name]  ROUNDING NOTE   Subjective:   Patient reports continued chest congestion. She is still on supplemental O2.  Denies any issues with HD yesterday. Patient had received her antihypertensives yesterday prior to treamtment which may have caused hypotension during HD.  Patient's sister is in the room.  Patient's requested to MD that they are wanting to move to [facility].   Objective:  Vital signs in last 24 hours:  Temp:  [97.5 F (36.4 C)-98.1 F (36.7 C)] 98 F (36.7 C) (12/12 0741) Pulse Rate:  [85-117] 85 (12/12 0741) Resp:  [12-23] 16 (12/12 0741) BP: (91-148)/(66-90) 115/83 (12/12 0741) SpO2:  [97 %-100 %] 97 % (12/12 0741)  Weight change:  Filed Weights   01/26/20 0305 01/27/20 0500 01/28/20 0427  Weight: 106.9 kg 106.8 kg 107.2 kg    Intake/Output: I/O last 3 completed shifts: In: 479.9 [P.O.:240; I.V.:239.9] Out: 1500 [Other:1500]   Intake/Output this shift:  No intake/output data recorded.  Physical Exam: General: NAD,   Head: Normocephalic, atraumatic. Moist oral mucosal membranes  Eyes: Anicteric, PERRL  Neck: Supple, trachea midline  Lungs:  On 3L [place], decreased breath sounds   Heart: Regular rate and rhythm  Abdomen:  Soft, nontender,   Extremities:  No peripheral edema.  Neurologic: Nonfocal, moving all four extremities  Skin: No lesions  Access: LUA AVF    Basic Metabolic Panel: Recent Labs  Lab 01/24/20 0626 01/26/20 1121 01/27/20 0526 01/27/20 0829 01/28/20 0534 01/29/20 0322 01/30/20 0412  NA 136   < > 133* 131* 129* 133* 133*  K 5.6*   < > 4.9 4.8 4.5 4.3 3.9  CL 97*   < > 93* 93* 90* 95* 93*  CO2 28   < > 27 27 26 28 31   GLUCOSE 170*   < > 74 59* 123* 149* 108*  BUN 28*   < > 39* 42* 50* 20 14  CREATININE 4.90*   < > 5.38* 5.75* 6.95* 4.25* 3.65*  CALCIUM 9.2   < > 8.2* 8.1* 8.2* 8.2* 8.6*  PHOS 5.4*  --  5.0* 5.7* 6.0*  --   --    < > = values in this interval not displayed.    Liver Function Tests: Recent  Labs  Lab 01/23/20 1143 01/27/20 0829  AST 22  --   ALT 18  --   ALKPHOS 58  --   BILITOT 0.9  --   PROT 7.5  --   ALBUMIN 3.8 3.3*   No results for input(s): LIPASE, AMYLASE in the last 168 hours. No results for input(s): AMMONIA in the last 168 hours.  CBC: Recent Labs  Lab 01/26/20 0409 01/27/20 0829 01/28/20 0534 01/29/20 0322 01/30/20 0412  WBC 10.0 7.1 7.2 8.6 9.0  HGB 9.9* 9.3* 9.6* 10.3* 9.3*  HCT 34.0* 32.4* 32.4* 33.8* 32.3*  MCV 99.7 100.3* 99.4 96.8 99.7  PLT 156 147* 167 191 167    Cardiac Enzymes: No results for input(s): CKTOTAL, CKMB, CKMBINDEX, TROPONINI in the last 168 hours.  BNP: Invalid input(s): POCBNP  CBG: Recent Labs  Lab 01/28/20 2309 01/29/20 0752 01/29/20 1208 01/29/20 2140 01/30/20 0743  GLUCAP 177* 118* 103* 90 97    Microbiology: Results for orders placed or performed during the hospital encounter of 01/23/20  Resp Panel by RT-PCR (Flu A&B, Covid) Nasopharyngeal Swab     Status: None   Collection Time: 01/23/20 11:43 AM   Specimen: Nasopharyngeal Swab; Nasopharyngeal(NP) swabs in vial transport medium  Result Value  Ref Range Status   SARS Coronavirus 2 by RT PCR NEGATIVE NEGATIVE Final    Comment: (NOTE) SARS-CoV-2 target nucleic acids are NOT DETECTED.  The SARS-CoV-2 RNA is generally detectable in upper respiratory specimens during the acute phase of infection. The lowest concentration of SARS-CoV-2 viral copies this assay can detect is 138 copies/mL. A negative result does not preclude SARS-Cov-2 infection and should not be used as the sole basis for treatment or other patient management decisions. A negative result may occur with  improper specimen collection/handling, submission of specimen other than nasopharyngeal swab, presence of viral mutation(s) within the areas targeted by this assay, and inadequate number of viral copies(<138 copies/mL). A negative result must be combined with clinical observations, patient  history, and epidemiological information. The expected result is Negative.  Fact Sheet for Patients:  EntrepreneurPulse.com.au  Fact Sheet for Healthcare Providers:  IncredibleEmployment.be  This test is no t yet approved or cleared by the [place] FDA and  has been authorized for detection and/or diagnosis of SARS-CoV-2 by FDA under an Emergency Use Authorization (EUA). This EUA will remain  in effect (meaning this test can be used) for the duration of the COVID-19 declaration under Section 564(b)(1) of the Act, 21 U.S.C.section 360bbb-3(b)(1), unless the authorization is terminated  or revoked sooner.       Influenza A by PCR NEGATIVE NEGATIVE Final   Influenza B by PCR NEGATIVE NEGATIVE Final    Comment: (NOTE) The Xpert Xpress SARS-CoV-2/FLU/RSV plus assay is intended as an aid in the diagnosis of influenza from Nasopharyngeal swab specimens and should not be used as a sole basis for treatment. Nasal washings and aspirates are unacceptable for Xpert Xpress SARS-CoV-2/FLU/RSV testing.  Fact Sheet for Patients: EntrepreneurPulse.com.au  Fact Sheet for Healthcare Providers: IncredibleEmployment.be  This test is not yet approved or cleared by the [place] FDA and has been authorized for detection and/or diagnosis of SARS-CoV-2 by FDA under an Emergency Use Authorization (EUA). This EUA will remain in effect (meaning this test can be used) for the duration of the COVID-19 declaration under Section 564(b)(1) of the Act, 21 U.S.C. section 360bbb-3(b)(1), unless the authorization is terminated or revoked.  Performed at [facility], [address] [place], [place] [address]     Coagulation Studies: Recent Labs    01/28/20 0534 01/29/20 0322 01/30/20 0412  LABPROT 14.1 15.3* 17.8*  INR 1.1 1.3* 1.5*    Urinalysis: No results for input(s): COLORURINE, LABSPEC, PHURINE,  GLUCOSEU, HGBUR, BILIRUBINUR, KETONESUR, PROTEINUR, UROBILINOGEN, NITRITE, LEUKOCYTESUR in the last 72 hours.  Invalid input(s): APPERANCEUR    Imaging: DG Chest 2 View  Result Date: 01/28/2020 CLINICAL DATA:  Shortness of breath EXAM: CHEST - 2 VIEW COMPARISON:  01/23/2020, 09/07/2019 FINDINGS: Clips over the left axillary region. Surgical hardware in the cervical spine. Cardiomegaly with mild central vascular congestion. Minimal right perihilar opacity. Aortic valve prosthesis. IMPRESSION: 1. Cardiomegaly with mild central congestion. 2. Minimal right perihilar opacity, atelectasis versus minimal infiltrate. Electronically Signed   By: Donavan Foil M.D.   On: 01/28/2020 21:01     Medications:    sodium chloride     sodium chloride     heparin 1,900 Units/hr (01/30/20 0839)    amiodarone  200 mg Oral Daily   atorvastatin  40 mg Oral Daily   Chlorhexidine Gluconate Cloth  6 each Topical Q0600   DULoxetine  30 mg Oral Daily   epoetin (EPOGEN/PROCRIT) injection  4,000 Units Intravenous Q M,W,F-HD   guaiFENesin  600 mg Oral BID   insulin aspart  0-6 Units Subcutaneous TID WC   insulin glargine  17 Units Subcutaneous QHS   ipratropium-albuterol  3 mL Nebulization Q6H   levothyroxine  88 mcg Oral QAC breakfast   losartan  25 mg Oral Daily   midodrine  10 mg Oral Q M,W,F-HD   polyethylene glycol  51 g Oral Daily   senna-docusate  1 tablet Oral QHS   Vitamin D (Ergocalciferol)  50,000 Units Oral Q Tue   warfarin  7 mg Oral ONCE-1600   Warfarin - Pharmacist Dosing Inpatient   Does not apply q1600   sodium chloride, sodium chloride, alteplase, heparin, lactulose, lidocaine (PF), lidocaine-prilocaine, ondansetron **OR** ondansetron (ZOFRAN) IV, oxyCODONE-acetaminophen **AND** oxyCODONE, pentafluoroprop-tetrafluoroeth  Assessment/ Plan:  [name] is a 69 y.o.  female with medical problems including Diabetes Type 2, HF with EF of 45%, ESRD on HD MWF,  presented to the ED on 01/22/2020 for SOB.  [facility]/ [facility]/ MWF/ LUE AVF . EDW 108   1. ESRD on HD with fluid overload  - HD treatment yesterday, total UF of 5.9Y: complicated by hypotension, unable to achieve total UF of 3L.  - outpatient dry weight will likely need to be decreased - Will plan for HD treatment tomorrow   2. Hyperkalemia - resolved, K 3.9  3. Hyponatremia  - improved , 133  4. Anemia of CKD  - on epo with HD - hgb 9.3   5. Secondary Hyperparathyroidism  - on calcium acetate as an outpatient   6. DM type 2 with CKD  - continue current antihyperglycemic regimen    LOS: [place] 12/12/202110:43 AM

## 2020-01-30 NOTE — Consult Note (Signed)
[place] for Warfarin/Heparin Indication: afib s/p TAVR.   Allergies  Allergen Reactions  . Buspirone Other (See Comments)    Weakness  . Diltiazem Hcl Palpitations  . Gabapentin Palpitations  . Hydralazine Rash  . Lisinopril Cough  . Lovastatin Palpitations  . Metformin Diarrhea  . Pravastatin Other (See Comments)    Insomnia  . Sitagliptin Other (See Comments)    Constipation  . Tramadol Itching   Patient Measurements: Height: 5\' 3"  (160 cm) Weight: 107.2 kg (236 lb 4.8 oz) IBW/kg (Calculated) : 52.4  Vital Signs: Temp: 97.9 F (36.6 C) (12/12 1529) Temp Source: Oral (12/12 0741) BP: 126/73 (12/12 1529) Pulse Rate: 92 (12/12 1529)  Labs: Recent Labs    01/28/20 0534 01/29/20 0322 01/30/20 0412 01/30/20 1453  HGB 9.6* 10.3* 9.3*  --   HCT 32.4* 33.8* 32.3*  --   PLT 167 191 167  --   LABPROT 14.1 15.3* 17.8*  --   INR 1.1 1.3* 1.5*  --   HEPARINUNFRC 0.40 0.52 <0.10* 0.21*  CREATININE 6.95* 4.25* 3.65*  --    Estimated Creatinine Clearance: 17.1 mL/min (A) (by C-G formula based on SCr of 3.65 mg/dL (H)).  Medical History: Past Medical History:  Diagnosis Date  . Anemia   . Anxiety   . Aortic valve stenosis   . Arrhythmia    atrial fibrillation  . Arthritis    feet, legs  . B12 deficiency   . Bowel obstruction ([place])   . CHF (congestive heart failure) ([place])   . CKD (chronic kidney disease)    protein in urine  . Colostomy in place [facility])   . Diabetes mellitus without complication ([place])   . Dialysis patient [facility])    T/Th/Sa  . Diverticulitis large intestine   . Dysrhythmia   . Ectopic atrial tachycardia ([place])   . FSGS (focal segmental glomerulosclerosis)   . Gastritis   . GI bleed   . Heart murmur   . Hyperlipidemia   . Hypertension   . Hypothyroidism   . Hypothyroidism    unspecified  . MRSA (methicillin resistant Staphylococcus aureus)    at abdominal wound.  Jan 2017.  Treated.   . Neuropathy   .  Neuropathy in diabetes ([place])   . Obesity   . Pedal edema   . Psoriasis   . Shortness of breath dyspnea   . Sleep apnea    CPAP  . Vertigo    Medications:  Warfarin 1.5mg  daily; last dose was 12/4 - pharmacy tech confirmed dose with patient's daughter  Assessment: 68yo female with a past medical history of anemia, anxiety, diabetes, CHF, CKD ESRD (hemodialysis MWF), hypertension, obesity, presents to the emergency department for shortness of breath. Patient was on Eliquis in the past and was switched to warfarin around October 2021. Pharmacy has been consulted for warfarin dosing and monitoring for afib and s/p TAVR (2019). Patient follows at the [facility]. Spoke with patient's daughter - she stated patient was previously on warfarin 2mg  and was changed to 1mg  around 12/19/19 due to having darker stools, then patient was recently changed to warfarin 1.5mg  12/2. CHADSVASc of 5. Plan to bridge warfarin with heparin gtt. Warfarin is not dialyzed.   DDI: amiodarone and levothyroxine   HDW 78.1 kg   12/6 2255 HL 0.14  increase Heparin to 1200 units/hr  12/7 0704 HL 0.22 1000 unit bolus and increase heparin infusion to 1350 units/hr.  12/8 0409 HL 0.22 1000 unit bolus  and increase heparin infusion to 1550 units/hr 12/8 1356 HL 0.41 12/8 2248 HL 0.11 increase heparin infusion to 1700 units/hr. Seem to be a lab drawn error.  12/9 0829 HL 0.52, therapeutic x 1 12/9 1728 HL 0.37, therapeutic x 2 12/10 0534 HL 0.40, therapeutic x 3 12/11 0322 HL 0.52, therapeutic x 4 12/12 0412 HL < 0.10, subtherapeutic 12/12 1453 HL=0.21  subtherapeutic  Date INR Dose 12/5 1.1 Not given 12/6  1.1 3 mg  12/7 1.1 3 mg - not charted given.  12/8 1.1 5 mg - not charted given  12/9 1.2 5 mg 12/10  1.1 5 mg - not charted until 12/11 @ 0008 12/11 1.3 5mg  12/12 1.5 7mg    Goal of Therapy:  INR 2-3 for warfarin  Heparin level 0.3 - 0.7.  Monitor platelets by anticoagulation protocol: Yes   Plan:   Heparin:   12/12 1453 HL=0.21  Subtherapeutic   Will give 1200 unit bolus and increase heparin infusion to 2050 units/hr. Recheck heparin level q8hr until therapeutic, then daily. CBC daily while on heparin.   Warfarin:  INR subtherapeutic. Will give warfarin 7mg  tonight x 1. Daily INR ordered.  Renda Rolls, PharmD, [facility] 01/30/2020 4:07 PM

## 2020-01-30 NOTE — Consult Note (Signed)
[place] for Warfarin/Heparin Indication: afib s/p TAVR.   Allergies  Allergen Reactions  . Buspirone Other (See Comments)    Weakness  . Diltiazem Hcl Palpitations  . Gabapentin Palpitations  . Hydralazine Rash  . Lisinopril Cough  . Lovastatin Palpitations  . Metformin Diarrhea  . Pravastatin Other (See Comments)    Insomnia  . Sitagliptin Other (See Comments)    Constipation  . Tramadol Itching   Patient Measurements: Height: 5\' 3"  (160 cm) Weight: 107.2 kg (236 lb 4.8 oz) IBW/kg (Calculated) : 52.4  Vital Signs: Temp: 97.9 F (36.6 C) (12/12 0447) Temp Source: Oral (12/12 0447) BP: 103/74 (12/12 0447) Pulse Rate: 102 (12/12 0447)  Labs: Recent Labs    01/28/20 0534 01/29/20 0322 01/30/20 0412  HGB 9.6* 10.3* 9.3*  HCT 32.4* 33.8* 32.3*  PLT 167 191 167  LABPROT 14.1 15.3* 17.8*  INR 1.1 1.3* 1.5*  HEPARINUNFRC 0.40 0.52 <0.10*  CREATININE 6.95* 4.25* 3.65*   Estimated Creatinine Clearance: 17.1 mL/min (A) (by C-G formula based on SCr of 3.65 mg/dL (H)).  Medical History: Past Medical History:  Diagnosis Date  . Anemia   . Anxiety   . Aortic valve stenosis   . Arrhythmia    atrial fibrillation  . Arthritis    feet, legs  . B12 deficiency   . Bowel obstruction ([place])   . CHF (congestive heart failure) ([place])   . CKD (chronic kidney disease)    protein in urine  . Colostomy in place [facility])   . Diabetes mellitus without complication ([place])   . Dialysis patient [facility])    T/Th/Sa  . Diverticulitis large intestine   . Dysrhythmia   . 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 FSGS (focal segmental glomerulosclerosis)   . Gastritis   . GI bleed   . Heart murmur   . Hyperlipidemia   . Hypertension   . Hypothyroidism   . Hypothyroidism    unspecified  . MRSA (methicillin resistant Staphylococcus aureus)    at abdominal wound.  Jan 2017.  Treated.   . Neuropathy   . Neuropathy in diabetes ([place])   . Obesity   .  Pedal edema   . Psoriasis   . Shortness of breath dyspnea   . Sleep apnea    CPAP  . Vertigo    Medications:  Warfarin 1.5mg  daily; last dose was 12/4 - pharmacy tech confirmed dose with patient's daughter  Assessment: 69yo female with a past medical history of anemia, anxiety, diabetes, CHF, CKD ESRD (hemodialysis MWF), hypertension, obesity, presents to the emergency department for shortness of breath. Patient was on Eliquis in the past and was switched to warfarin around October 2021. Pharmacy has been consulted for warfarin dosing and monitoring for afib and s/p TAVR (2019). Patient follows at the [facility] Spoke with patient's daughter - she stated patient was previously on warfarin 2mg  and was changed to 1mg  around 12/19/19 due to having darker stools, then patient was recently changed to warfarin 1.5mg  12/2. CHADSVASc of 5. Plan to bridge warfarin with heparin gtt. Warfarin is not dialyzed.   DDI: amiodarone and levothyroxine   HDW 78.1 kg   12/6 2255 HL 0.14  increase Heparin to 1200 units/hr  12/7 0704 HL 0.22 1000 unit bolus and increase heparin infusion to 1350 units/hr.  12/8 0409 HL 0.22 1000 unit bolus and increase heparin infusion to 1550 units/hr 12/8 1356 HL 0.41 12/8 2248 HL 0.11 increase heparin infusion to 1700 units/hr.  Seem to be a lab drawn error.  12/9 0829 HL 0.52, therapeutic x 1 12/9 1728 HL 0.37, therapeutic x 2 12/10 0534 HL 0.40, therapeutic x 3 12/11 0322 HL 0.52, therapeutic x 4 12/12 0412 HL < 0.10, subtherapeutic  Date INR Dose 12/5 1.1 Not given 12/6  1.1 3 mg  12/7 1.1 3 mg - not charted given.  12/8 1.1 5 mg - not charted given  12/9 1.2 5 mg 12/10  1.1 5 mg - not charted until 12/11 @ 0008 12/11 1.3 5mg  12/12 1.5 7mg    Goal of Therapy:  INR 2-3 for warfarin  Heparin level 0.3 - 0.7.  Monitor platelets by anticoagulation protocol: Yes   Plan:  Heparin:   Heparin level is subherapeutic. Will given 2000 unit bolus and increase  heparin infusion to 1900 units/hr. Recheck heparin level q8hr until therapeutic, then daily. CBC daily while on heparin.   Warfarin:  INR subtherapeutic. Will give warfarin 7mg  tonight x 1. Daily INR ordered.  Renda Rolls, PharmD, [facility] 01/30/2020 6:48 AM

## 2020-01-30 NOTE — Progress Notes (Signed)
PROGRESS NOTE    [name]  OMV:[id number] DOB: 03/12/1950 DOA: 01/23/2020 PCP: Adin Hector, MD   Brief Narrative: Taken from H&P  [name] is a 69 y.o. female with medical history significant for insulin-dependent diabetes mellitus type 2, heart failure with reduced ejection fraction, end-stage renal disease on hemodialysis, obstructive sleep apnea not wearing CPAP, hypertension, aortic atherosclerosis, persistent atrial fibrillation, status post TAVR, morbid obesity, acquired hypothyroid, history of focal segmental glomerulosclerosis, presented to the emergency department via EMS for chief concerns of shortness of breath.   Shortness of breath that started progressively worsened throughout the day 01/22/20. Daughter denies fever, nausea, vomiting, chest pain, abdominal pain, dysuria, hematuria, diarrhea, blood in her stool. Patient and daughter states that she used her home oxygen without much relief.   Family endorses new increased productive cough with yellow mucus throughout the day. Endorses increased swelling in her legs.   Patient has frequent recent hospitalizations at [facility] with similar symptoms.  Also found to have reduced EF to 25% on repeat echo and a valvular thrombus.  Her Eliquis was transitioned to warfarin.  Also recent GI bleed with aspirin and warfarin, initially discontinue both and then later started on warfarin only. She was also recommended to have dialysis 4 times a week to prevent volume overload.  Hardly makes any urine, volume is being managed with dialysis.  Cardiology was consulted.  Subjective: Continues w/ dyspnea but up in bed, dyspnea somewhat improved, o2 has been weaned down. Fatigued. Has appetite.   Assessment & Plan:   Principal Problem:   Acute hypoxemic respiratory failure (HCC) Active Problems:   Obesity   Anemia   Benign essential HTN   Acquired atrophy of thyroid   FSGS (focal segmental glomerulosclerosis)   Acute kidney injury  superimposed on chronic kidney disease (HCC)   S/P TAVR (transcatheter aortic valve replacement)   DM type 2 with diabetic peripheral neuropathy (HCC)   Acute respiratory failure with hypoxia (HCC)   DDD (degenerative disc disease), lumbosacral   OSA (obstructive sleep apnea)  Acute on chronic hypoxic respiratory failure.  Most likely cardiogenic pulmonary edema/volume overload secondary volume overload from inadequate dialysis, complicated by hfref. Makes minimal urine. On 12/7 required angioplasty of brachiocephalic fistula. Baseline 3-5 L [place] O2, currently weaned down to 4  L. Recent echocardiogram with worsening of her EF to 25%, she also had right and left cardiac catheterization recently done at [facility]. 1000 ml ultrafiltrated yesterday, dialysis session stopped short due to hypotension -Volume is being managed with dialysis, plan for dialysis tomorrow -Might need more frequent dialysis to prevent recurrent hospitalizations with similar symptoms. - given persistent productive cough will repeat cxr, if small opacity still seen will trial starting abx. Pt denies smoking hx or dx of copd. She is anticoagulated so I think PE unlikely. Did not respond to duonebs  HFrEF  Most recent EF 20%. -Cardiology was consulted-appreciate their recommendations -defer institution of evidence-based meds to cardiology. They are advising a conservative approach w/o change in current therapeutics - volume mgmt via dialysis  ESRD on HD(MWF).   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 Fistula is deep complicating access. - next dialysis 12/13  Persistent A. fib.  She was previously on Eliquis which was transitioned to warfarin due to history of aortic valve thrombosis. Normal HR. TSH wnl. -Continue with amiodarone -Continue warfarin per pharmacy, on heparin until INR therapeutic. INR up to 1.5 today  Hyperkalemia Resolved w/ dialysis  Hyponatremia 2/2 inadequate  dialysis, improved to 133  with dialysis  - monitor  History of chronic pain. -Continue home oxycodone  Aortic stenosis s/p TAVR.  History of recent aortic valve thrombosis. -Continue anticoagulation as above  Insulin-dependent diabetes mellitus. Well controlled, a1c 6.8 08/2019 -Continue SSI, decreased home lantus to 35   Obstructive sleep apnea.  She is unable to tolerate auto CPAP. She will need outpatient pulmonology referral for further recommendations.  Hypothyroidism. TSH wnl -Continue home Synthroid.  Constipation - Standing miralax/senna, increased miralax dose to 54 daily, added lactulose prn  Debility - PT/OT consulted  Morbid obesity. Body mass index is 41.86 kg/m.  This will complicate overall prognosis.  Objective: Vitals:   01/30/20 0001 01/30/20 0447 01/30/20 0741 01/30/20 1135  BP: 109/66 103/74 115/83 135/67  Pulse: 99 (!) 102 85 89  Resp: 20 20 16 16   Temp: 98.1 F (36.7 C) 97.9 F (36.6 C) 98 F (36.7 C) (!) 97.2 F (36.2 C)  TempSrc: Oral Oral Oral   SpO2: 98% 97% 97% 100%  Weight:      Height:        Intake/Output Summary (Last 24 hours) at 01/30/2020 1415 Last data filed at 01/30/2020 0457 Gross per 24 hour  Intake 309.87 ml  Output 1000 ml  Net -690.13 ml   Filed Weights   01/26/20 0305 01/27/20 0500 01/28/20 0427  Weight: 106.9 kg 106.8 kg 107.2 kg    Examination:  General.  Morbidly obese lady, mildly distressed. Pulmonary.  Bilateral basal crackles, exp wheezes, improved from yesterday CV.  Regular rate and rhythm, no JVD, rub or murmur. Abdomen.  Soft, nontender, nondistended, BS positive. Morbidly obese CNS.  Alert and oriented x3.  No focal neurologic deficit. Extremities.  No edema, no cyanosis, pulses intact and symmetrical. Psychiatry.  Judgment and insight appears normal.  DVT prophylaxis: Coumadin/heparin Code Status: Full Family Communication: Discussed with patient Disposition Plan:  Status is: Inpatient  Remains inpatient  appropriate because:Inpatient level of care appropriate due to severity of illness   Dispo: The patient is from: Home              Anticipated d/c is to: Home (patient declines SNF) with home health              Anticipated d/c date is: 2-5 days              Patient currently is not medically stable to d/c.   Consultants:   Cardiology  Nephrology  Procedures:  Antimicrobials:   Data Reviewed: I have personally reviewed following labs and imaging studies  CBC: Recent Labs  Lab 01/26/20 0409 01/27/20 0829 01/28/20 0534 01/29/20 0322 01/30/20 0412  WBC 10.0 7.1 7.2 8.6 9.0  HGB 9.9* 9.3* 9.6* 10.3* 9.3*  HCT 34.0* 32.4* 32.4* 33.8* 32.3*  MCV 99.7 100.3* 99.4 96.8 99.7  PLT 156 147* 167 191 751   Basic Metabolic Panel: Recent Labs  Lab 01/24/20 0626 01/26/20 1121 01/27/20 0526 01/27/20 0829 01/28/20 0534 01/29/20 0322 01/30/20 0412  NA 136   < > 133* 131* 129* 133* 133*  K 5.6*   < > 4.9 4.8 4.5 4.3 3.9  CL 97*   < > 93* 93* 90* 95* 93*  CO2 28   < > 27 27 26 28 31   GLUCOSE 170*   < > 74 59* 123* 149* 108*  BUN 28*   < > 39* 42* 50* 20 14  CREATININE 4.90*   < > 5.38* 5.75* 6.95* 4.25* 3.65*  CALCIUM 9.2   < > 8.2* 8.1* 8.2* 8.2* 8.6*  PHOS 5.4*  --  5.0* 5.7* 6.0*  --   --    < > = values in this interval not displayed.   GFR: Estimated Creatinine Clearance: 17.1 mL/min (A) (by C-G formula based on SCr of 3.65 mg/dL (H)). Liver Function Tests: Recent Labs  Lab 01/27/20 0829  ALBUMIN 3.3*   No results for input(s): LIPASE, AMYLASE in the last 168 hours. No results for input(s): AMMONIA in the last 168 hours. Coagulation Profile: Recent Labs  Lab 01/26/20 0409 01/27/20 0526 01/28/20 0534 01/29/20 0322 01/30/20 0412  INR 1.1 1.2 1.1 1.3* 1.5*   Cardiac Enzymes: No results for input(s): CKTOTAL, CKMB, CKMBINDEX, TROPONINI in the last 168 hours. BNP (last 3 results) No results for input(s): PROBNP in the last 8760 hours. HbA1C: No results for  input(s): HGBA1C in the last 72 hours. CBG: Recent Labs  Lab 01/29/20 0752 01/29/20 1208 01/29/20 2140 01/30/20 0743 01/30/20 1136  GLUCAP 118* 103* 90 97 85   Lipid Profile: No results for input(s): CHOL, HDL, LDLCALC, TRIG, CHOLHDL, LDLDIRECT in the last 72 hours. Thyroid Function Tests: Recent Labs    01/28/20 0534  TSH 1.553   Anemia Panel: No results for input(s): VITAMINB12, FOLATE, FERRITIN, TIBC, IRON, RETICCTPCT in the last 72 hours. Sepsis Labs: No results for input(s): PROCALCITON, LATICACIDVEN in the last 168 hours.  Recent Results (from the past 240 hour(s))  Resp Panel by RT-PCR (Flu A&B, Covid) Nasopharyngeal Swab     Status: None   Collection Time: 01/23/20 11:43 AM   Specimen: Nasopharyngeal Swab; Nasopharyngeal(NP) swabs in vial transport medium  Result Value Ref Range Status   SARS Coronavirus 2 by RT PCR NEGATIVE NEGATIVE Final    Comment: (NOTE) SARS-CoV-2 target nucleic acids are NOT DETECTED.  The SARS-CoV-2 RNA is generally detectable in upper respiratory specimens during the acute phase of infection. The lowest concentration of SARS-CoV-2 viral copies this assay can detect is 138 copies/mL. A negative result does not preclude SARS-Cov-2 infection and should not be used as the sole basis for treatment or other patient management decisions. A negative result may occur with  improper specimen collection/handling, submission of specimen other than nasopharyngeal swab, presence of viral mutation(s) within the areas targeted by this assay, and inadequate number of viral copies(<138 copies/mL). A negative result must be combined with clinical observations, patient history, and epidemiological information. The expected result is Negative.  Fact Sheet for Patients:  EntrepreneurPulse.com.au  Fact Sheet for Healthcare Providers:  IncredibleEmployment.be  This test is no t yet approved or cleared by the [place]  FDA and  has been authorized for detection and/or diagnosis of SARS-CoV-2 by FDA under an Emergency Use Authorization (EUA). This EUA will remain  in effect (meaning this test can be used) for the duration of the COVID-19 declaration under Section 564(b)(1) of the Act, 21 U.S.C.section 360bbb-3(b)(1), unless the authorization is terminated  or revoked sooner.       Influenza A by PCR NEGATIVE NEGATIVE Final   Influenza B by PCR NEGATIVE NEGATIVE Final    Comment: (NOTE) The Xpert Xpress SARS-CoV-2/FLU/RSV plus assay is intended as an aid in the diagnosis of influenza from Nasopharyngeal swab specimens and should not be used as a sole basis for treatment. Nasal washings and aspirates are unacceptable for Xpert Xpress SARS-CoV-2/FLU/RSV testing.  Fact Sheet for Patients: EntrepreneurPulse.com.au  Fact Sheet for Healthcare Providers: IncredibleEmployment.be  This test is not yet approved  or cleared by the [place] and has been authorized for detection and/or diagnosis of SARS-CoV-2 by FDA under an Emergency Use Authorization (EUA). This EUA will remain in effect (meaning this test can be used) for the duration of the COVID-19 declaration under Section 564(b)(1) of the Act, 21 U.S.C. section 360bbb-3(b)(1), unless the authorization is terminated or revoked.  Performed at [facility], [address] [place], [place] [address]      Radiology Studies: DG Chest 2 View  Result Date: 01/28/2020 CLINICAL DATA:  Shortness of breath EXAM: CHEST - 2 VIEW COMPARISON:  01/23/2020, 09/07/2019 FINDINGS: Clips over the left axillary region. Surgical hardware in the cervical spine. Cardiomegaly with mild central vascular congestion. Minimal right perihilar opacity. Aortic valve prosthesis. IMPRESSION: 1. Cardiomegaly with mild central congestion. 2. Minimal right perihilar opacity, atelectasis versus minimal infiltrate. Electronically  Signed   By: Donavan Foil M.D.   On: 01/28/2020 21:01    Scheduled Meds: . amiodarone  200 mg Oral Daily  . atorvastatin  40 mg Oral Daily  . Chlorhexidine Gluconate Cloth  6 each Topical Q0600  . DULoxetine  30 mg Oral Daily  . epoetin (EPOGEN/PROCRIT) injection  4,000 Units Intravenous Q M,W,F-HD  . guaiFENesin  600 mg Oral BID  . insulin aspart  0-6 Units Subcutaneous TID WC  . insulin glargine  17 Units Subcutaneous QHS  . levothyroxine  88 mcg Oral QAC breakfast  . losartan  25 mg Oral Daily  . midodrine  10 mg Oral Q M,W,F-HD  . polyethylene glycol  51 g Oral Daily  . senna-docusate  1 tablet Oral QHS  . Vitamin D (Ergocalciferol)  50,000 Units Oral Q Tue  . warfarin  7 mg Oral ONCE-1600  . Warfarin - Pharmacist Dosing Inpatient   Does not apply q1600   Continuous Infusions: . sodium chloride    . sodium chloride    . heparin 1,900 Units/hr (01/30/20 0839)     LOS: 6 days   Time spent: 25 minutes.  Desma Maxim, MD [facility]  If 7PM-7AM, please contact night-coverage [email or web address]  01/30/2020, 2:15 PM

## 2020-01-30 NOTE — Progress Notes (Signed)
Pt transferred to 1C room 130, gave report to nurse Cherlyn Labella, RN. Nurse transferred pt ad all belongings to the room.

## 2020-01-31 LAB — BASIC METABOLIC PANEL
Anion gap: 12 (ref 5–15)
BUN: 23 mg/dL (ref 8–23)
CO2: 29 mmol/L (ref 22–32)
Calcium: 8.9 mg/dL (ref 8.9–10.3)
Chloride: 92 mmol/L — ABNORMAL LOW (ref 98–111)
Creatinine, Ser: 4.91 mg/dL — ABNORMAL HIGH (ref 0.44–1.00)
GFR, Estimated: 9 mL/min — ABNORMAL LOW (ref >60)
Glucose, Bld: 124 mg/dL — ABNORMAL HIGH (ref 70–99)
Potassium: 4.2 mmol/L (ref 3.5–5.1)
Sodium: 133 mmol/L — ABNORMAL LOW (ref 135–145)

## 2020-01-31 LAB — CBC
HCT: 32.1 % — ABNORMAL LOW (ref 36.0–46.0)
Hemoglobin: 9.6 g/dL — ABNORMAL LOW (ref 12.0–15.0)
MCH: 29.4 pg (ref 26.0–34.0)
MCHC: 29.9 g/dL — ABNORMAL LOW (ref 30.0–36.0)
MCV: 98.5 fL (ref 80.0–100.0)
Platelets: 168 10*3/uL (ref 150–400)
RBC: 3.26 MIL/uL — ABNORMAL LOW (ref 3.87–5.11)
RDW: 15.4 % (ref 11.5–15.5)
WBC: 11 10*3/uL — ABNORMAL HIGH (ref 4.0–10.5)
nRBC: 0.2 % (ref 0.0–0.2)

## 2020-01-31 LAB — GLUCOSE, CAPILLARY
Glucose-Capillary: 84 mg/dL (ref 70–99)
Glucose-Capillary: 100 mg/dL — ABNORMAL HIGH (ref 70–99)
Glucose-Capillary: 71 mg/dL (ref 70–99)
Glucose-Capillary: 86 mg/dL (ref 70–99)

## 2020-01-31 LAB — HEPARIN LEVEL (UNFRACTIONATED)
Heparin Unfractionated: 0.6 IU/mL (ref 0.30–0.70)
Heparin Unfractionated: 0.87 IU/mL — ABNORMAL HIGH (ref 0.30–0.70)

## 2020-01-31 LAB — PROTIME-INR
INR: 2.2 — ABNORMAL HIGH (ref 0.8–1.2)
Prothrombin Time: 23.5 seconds — ABNORMAL HIGH (ref 11.4–15.2)

## 2020-01-31 MED ORDER — WARFARIN SODIUM 4 MG PO TABS
4.0000 mg | ORAL_TABLET | Freq: Once | ORAL | Status: AC
  Administered 2020-01-31: 22:00:00 4 mg via ORAL
  Filled 2020-01-31: qty 1

## 2020-01-31 MED ORDER — EPOETIN ALFA 10000 UNIT/ML IJ SOLN
10000.0000 [IU] | INTRAMUSCULAR | Status: DC
  Administered 2020-01-31: 19:00:00 10000 [IU] via INTRAVENOUS
  Filled 2020-01-31: qty 1

## 2020-01-31 MED ORDER — HEPARIN (PORCINE) 25000 UT/250ML-% IV SOLN
1750.0000 [IU]/h | INTRAVENOUS | Status: DC
  Administered 2020-01-31: 13:00:00 1750 [IU]/h via INTRAVENOUS
  Filled 2020-01-31: qty 250

## 2020-01-31 NOTE — Progress Notes (Signed)
Patient went off unit at 1430 for dialysis treatment via bed.

## 2020-01-31 NOTE — Progress Notes (Signed)
[name]  ROUNDING NOTE   Subjective:   Patient continues to report shortness of breath and chest congestion. Supplemental O2 requirement down to 4L via nasal cannula. She received additional dialysis treatment during this weekend on Saturday with 1.5L of UF. We are planning for dialysis today as per her regular schedule.  Objective:  Vital signs in last 24 hours:  Temp:  [97.7 F (36.5 C)-98.2 F (36.8 C)] 97.7 F (36.5 C) (12/13 1158) Pulse Rate:  [85-104] 90 (12/13 1158) Resp:  [14-18] 14 (12/13 1158) BP: (106-143)/(71-99) 142/90 (12/13 1158) SpO2:  [93 %-100 %] 93 % (12/13 1158)  Weight change:  Filed Weights   01/26/20 0305 01/27/20 0500 01/28/20 0427  Weight: 106.9 kg 106.8 kg 107.2 kg    Intake/Output: I/O last 3 completed shifts: In: 350.8 [P.O.:240; I.V.:110.8] Out: 0    Intake/Output this shift:  No intake/output data recorded.  Physical Exam: General: Sitting up in bed, in no acute distress  Head: Moist oral mucosal membranes  Eyes: Sclerae and conjunctivae clear  Lungs:  Respirations slightly labored with accessory muscle use, On 4L [place], fine crackles +    Heart: Regular rate and rhythm  Abdomen:  Soft, nontender, non distended  Extremities:  No peripheral edema.  Neurologic: Awake, alert, speech clear and appropriate  Skin: No acute lesions or rashes  Access: LUA AVF    Basic Metabolic Panel: Recent Labs  Lab 01/27/20 0526 01/27/20 0829 01/28/20 0534 01/29/20 0322 01/30/20 0412 01/31/20 0023  NA 133* 131* 129* 133* 133* 133*  K 4.9 4.8 4.5 4.3 3.9 4.2  CL 93* 93* 90* 95* 93* 92*  CO2 27 27 26 28 31 29   GLUCOSE 74 59* 123* 149* 108* 124*  BUN 39* 42* 50* 20 14 23   CREATININE 5.38* 5.75* 6.95* 4.25* 3.65* 4.91*  CALCIUM 8.2* 8.1* 8.2* 8.2* 8.6* 8.9  PHOS 5.0* 5.7* 6.0*  --   --   --     Liver Function Tests: Recent Labs  Lab 01/27/20 0829  ALBUMIN 3.3*   No results for input(s): LIPASE, AMYLASE in the last 168  hours. No results for input(s): AMMONIA in the last 168 hours.  CBC: Recent Labs  Lab 01/27/20 0829 01/28/20 0534 01/29/20 0322 01/30/20 0412 01/31/20 0023  WBC 7.1 7.2 8.6 9.0 11.0*  HGB 9.3* 9.6* 10.3* 9.3* 9.6*  HCT 32.4* 32.4* 33.8* 32.3* 32.1*  MCV 100.3* 99.4 96.8 99.7 98.5  PLT 147* 167 191 167 168    Cardiac Enzymes: No results for input(s): CKTOTAL, CKMB, CKMBINDEX, TROPONINI in the last 168 hours.  BNP: Invalid input(s): POCBNP  CBG: Recent Labs  Lab 01/30/20 1136 01/30/20 1659 01/30/20 2038 01/31/20 0816 01/31/20 1159  GLUCAP 85 86 136* 84 100*    Microbiology: Results for orders placed or performed during the hospital encounter of 01/23/20  Resp Panel by RT-PCR (Flu A&B, Covid) Nasopharyngeal Swab     Status: None   Collection Time: 01/23/20 11:43 AM   Specimen: Nasopharyngeal Swab; Nasopharyngeal(NP) swabs in vial transport medium  Result Value Ref Range Status   SARS Coronavirus 2 by RT PCR NEGATIVE NEGATIVE Final    Comment: (NOTE) SARS-CoV-2 target nucleic acids are NOT DETECTED.  The SARS-CoV-2 RNA is generally detectable in upper respiratory specimens during the acute phase of infection. The lowest concentration of SARS-CoV-2 viral copies this assay can detect is 138 copies/mL. A negative result does not preclude SARS-Cov-2 infection and should not be used as the sole basis for treatment  or other patient management decisions. A negative result may occur with  improper specimen collection/handling, submission of specimen other than nasopharyngeal swab, presence of viral mutation(s) within the areas targeted by this assay, and inadequate number of viral copies(<138 copies/mL). A negative result must be combined with clinical observations, patient history, and epidemiological information. The expected result is Negative.  Fact Sheet for Patients:  EntrepreneurPulse.com.au  Fact Sheet for Healthcare Providers:   IncredibleEmployment.be  This test is no t yet approved or cleared by the [place] FDA and  has been authorized for detection and/or diagnosis of SARS-CoV-2 by FDA under an Emergency Use Authorization (EUA). This EUA will remain  in effect (meaning this test can be used) for the duration of the COVID-19 declaration under Section 564(b)(1) of the Act, 21 U.S.C.section 360bbb-3(b)(1), unless the authorization is terminated  or revoked sooner.       Influenza A by PCR NEGATIVE NEGATIVE Final   Influenza B by PCR NEGATIVE NEGATIVE Final    Comment: (NOTE) The Xpert Xpress SARS-CoV-2/FLU/RSV plus assay is intended as an aid in the diagnosis of influenza from Nasopharyngeal swab specimens and should not be used as a sole basis for treatment. Nasal washings and aspirates are unacceptable for Xpert Xpress SARS-CoV-2/FLU/RSV testing.  Fact Sheet for Patients: EntrepreneurPulse.com.au  Fact Sheet for Healthcare Providers: IncredibleEmployment.be  This test is not yet approved or cleared by the [place] FDA and has been authorized for detection and/or diagnosis of SARS-CoV-2 by FDA under an Emergency Use Authorization (EUA). This EUA will remain in effect (meaning this test can be used) for the duration of the COVID-19 declaration under Section 564(b)(1) of the Act, 21 U.S.C. section 360bbb-3(b)(1), unless the authorization is terminated or revoked.  Performed at [facility], [address] [place], [place] [address]     Coagulation Studies: Recent Labs    01/29/20 0322 01/30/20 0412 01/31/20 0023  LABPROT 15.3* 17.8* 23.5*  INR 1.3* 1.5* 2.2*    Urinalysis: No results for input(s): COLORURINE, LABSPEC, PHURINE, GLUCOSEU, HGBUR, BILIRUBINUR, KETONESUR, PROTEINUR, UROBILINOGEN, NITRITE, LEUKOCYTESUR in the last 72 hours.  Invalid input(s): APPERANCEUR    Imaging: DG Chest 1 View  Result Date:  01/30/2020 CLINICAL DATA:  Dyspnea. EXAM: CHEST  1 VIEW COMPARISON:  Frontal and lateral views 01/28/2020. FINDINGS: Stable cardiomegaly. Prior TAVR. Unchanged mediastinal contours with diffuse atherosclerosis and aortic tortuosity. There is new volume loss in the right lung base with increasing opacity. Vascular congestion with slight worsening. No pneumothorax or large pleural effusion. Surgical clips in the left axilla. IMPRESSION: 1. New volume loss in the right lung base with increasing opacity, favors atelectasis/partial collapse over pneumonia. 2. Stable cardiomegaly. Vascular congestion with slight worsening. Electronically Signed   By: Keith Rake M.D.   On: 01/30/2020 16:23     Medications:   . sodium chloride    . sodium chloride    . cefTRIAXone (ROCEPHIN)  IV Stopped (01/31/20 0030)  . heparin 1,750 Units/hr (01/31/20 0219)   . amiodarone  200 mg Oral Daily  . atorvastatin  40 mg Oral Daily  . azithromycin  250 mg Oral Daily  . Chlorhexidine Gluconate Cloth  6 each Topical Q0600  . DULoxetine  30 mg Oral Daily  . epoetin (EPOGEN/PROCRIT) injection  10,000 Units Intravenous Q M,W,F-HD  . guaiFENesin  600 mg Oral BID  . insulin aspart  0-6 Units Subcutaneous TID WC  . insulin glargine  17 Units Subcutaneous QHS  . levothyroxine  88 mcg Oral  QAC breakfast  . losartan  25 mg Oral Daily  . midodrine  10 mg Oral Q M,W,F-HD  . polyethylene glycol  51 g Oral Daily  . senna-docusate  1 tablet Oral QHS  . Vitamin D (Ergocalciferol)  50,000 Units Oral Q Tue  . warfarin  4 mg Oral ONCE-1600  . Warfarin - Pharmacist Dosing Inpatient   Does not apply q1600   sodium chloride, sodium chloride, alteplase, heparin, lactulose, lidocaine (PF), lidocaine-prilocaine, ondansetron **OR** ondansetron (ZOFRAN) IV, oxyCODONE-acetaminophen **AND** oxyCODONE, pentafluoroprop-tetrafluoroeth  Assessment/ Plan:  [name] is a 69 y.o.  female with medical problems including Diabetes Type  2, HF with EF of 45%, ESRD on HD MWF, presented to the ED on 01/22/2020 for SOB.  [facility]/ [facility]/ MWF/ LUE AVF . EDW 108   # ESRD on HD with fluid overload  Planning dialysis today Will continue MWF schedule  # Hyperkalemia Potassium normalized to 4.2 today  #Hyponatremia  Sodium level stays low 133 We will continue monitoring  # Anemia of CKD  Lab Results  Component Value Date   HGB 9.6 (L) 01/31/2020  Continue Epogen with HD  # Secondary Hyperparathyroidism  Lab Results  Component Value Date   PTH 173 (H) 01/24/2020   CALCIUM 8.9 01/31/2020   CAION 1.14 (L) 01/29/2019   PHOS 6.0 (H) 01/28/2020  Will continue monitoring bone mineral metabolism parameters  # DM type 2 with CKD  Lab Results  Component Value Date   HGBA1C 6.8 (H) 09/07/2019   Blood glucose readings within acceptable range  LOS: 7 Mearle Drew 12/13/202112:45 PM

## 2020-01-31 NOTE — Care Management Important Message (Signed)
Important Message  Patient Details  Name: [name] MRN: [id number] Date of Birth: 08/26/1950   Medicare Important Message Given:  Yes     [name] A Saron Vanorman 01/31/2020, 10:59 AM

## 2020-01-31 NOTE — Evaluation (Signed)
Occupational Therapy Evaluation Patient Details Name: [name] MRN: [id number] DOB: 1950-09-03 Today's Date: 01/31/2020    History of Present Illness Pt is a 69 y.o. female with medical history significant for insulin-dependent diabetes mellitus type 2, heart failure with reduced ejection fraction, end-stage renal disease on hemodialysis, obstructive sleep apnea not wearing CPAP, hypertension, aortic atherosclerosis, persistent atrial fibrillation, status post TAVR, morbid obesity, acquired hypothyroid, history of focal segmental glomerulosclerosis, presented to the emergency department via EMS for chief concerns of shortness of breath.  MD assessment includes: Acute on chronic hypoxic respiratory failure, hyperkalemia, and debility.   Clinical Impression   Upon entering the room, pt seated in bed with daughter present. Pt with no c/o pain and agreeable to OT intervention. OT educated and demonstrated use of yellow, level 1 resistive theraband for B UE strengthening exercises. Pt returning demonstrations for 2 sets of 10 chest pulls, shoulder diagonals, shoulder elevation, and bicep curls. Pt taking rest break between each set for pursed lip breathing exercises. Pt on 4 L O2 via [place] during session with O2 saturation at 93% or higher but pt with high level of perceived exertion. HR increasing to 120's with therapeutic exercise. Pt remained in bed with all needs within reach secondary to transport coming to get pt for dialysis soon. Pt continues to benefit from OT intervention.    Follow Up Recommendations  Home health OT;Supervision/Assistance - 24 hour    Equipment Recommendations  3 in 1 bedside commode       Precautions / Restrictions Precautions Precautions: Fall             ADL either performed or assessed with clinical judgement         Vision Baseline Vision/History: Wears glasses Wears Glasses: At all times Patient Visual Report: No change from baseline               Pertinent Vitals/Pain Pain Assessment: Faces Faces Pain Scale: Hurts a little bit Pain Location: generalized Pain Descriptors / Indicators: Discomfort Pain Intervention(s): Limited activity within patient's tolerance;Monitored during session;Premedicated before session;Repositioned              Cognition Arousal/Alertness: Awake/alert Behavior During Therapy: WFL for tasks assessed/performed Overall Cognitive Status: Within Functional Limits for tasks assessed                                              OT Goals(Current goals can be found in the care plan section) Acute Rehab OT Goals Patient Stated Goal: To get back home OT Goal Formulation: With patient Time For Goal Achievement: 02/10/20 Potential to Achieve Goals: Good  OT Frequency: Min 2X/week    AM-PAC OT "6 Clicks" Daily Activity     Outcome Measure Help from another person eating meals?: None Help from another person taking care of personal grooming?: A Little Help from another person toileting, which includes using toliet, bedpan, or urinal?: A Lot Help from another person bathing (including washing, rinsing, drying)?: A Lot Help from another person to put on and taking off regular upper body clothing?: A Little Help from another person to put on and taking off regular lower body clothing?: A Lot 6 Click Score: 16   End of Session Equipment Utilized During Treatment: Rolling walker;Oxygen Nurse Communication: Mobility status  Activity Tolerance: Patient limited by fatigue Patient left: in bed;with call bell/phone within reach;with bed  alarm set  OT Visit Diagnosis: Unsteadiness on feet (R26.81);Muscle weakness (generalized) (M62.81)                Time: 2081-3887 OT Time Calculation (min): 24 min Charges:  OT General Charges $OT Visit: 1 Visit OT Treatments $Therapeutic Exercise: 23-37 mins  Darleen Crocker, MS, OTR/L , CBIS ascom [phone]  01/31/20, 3:37 PM

## 2020-01-31 NOTE — Consult Note (Signed)
[place] for Warfarin/Heparin Indication: afib s/p TAVR.   Allergies  Allergen Reactions   Buspirone Other (See Comments)    Weakness   Diltiazem Hcl Palpitations   Gabapentin Palpitations   Hydralazine Rash   Lisinopril Cough   Lovastatin Palpitations   Metformin Diarrhea   Pravastatin Other (See Comments)    Insomnia   Sitagliptin Other (See Comments)    Constipation   Tramadol Itching   Patient Measurements: Height: 5\' 3"  (160 cm) Weight: 107.2 kg (236 lb 4.8 oz) IBW/kg (Calculated) : 52.4  Vital Signs: Temp: 97.8 F (36.6 C) (12/12 1818) BP: 106/87 (12/12 1818) Pulse Rate: 104 (12/12 1818)  Labs: Recent Labs    01/29/20 0322 01/30/20 0412 01/30/20 1453 01/31/20 0022 01/31/20 0023  HGB 10.3* 9.3*  --   --  9.6*  HCT 33.8* 32.3*  --   --  32.1*  PLT 191 167  --   --  168  LABPROT 15.3* 17.8*  --   --  23.5*  INR 1.3* 1.5*  --   --  2.2*  HEPARINUNFRC 0.52 <0.10* 0.21* 0.87*  --   CREATININE 4.25* 3.65*  --   --  4.91*   Estimated Creatinine Clearance: 12.7 mL/min (A) (by C-G formula based on SCr of 4.91 mg/dL (H)).  Medical History: Past Medical History:  Diagnosis Date   Anemia    Anxiety    Aortic valve stenosis    Arrhythmia    atrial fibrillation   Arthritis    feet, legs   B12 deficiency    Bowel obstruction (HCC)    CHF (congestive heart failure) (HCC)    CKD (chronic kidney disease)    protein in urine   Colostomy in place [facility])    Diabetes mellitus without complication ([place])    Dialysis patient ([place])    T/Th/Sa   Diverticulitis large intestine    Dysrhythmia    Ectopic atrial tachycardia (HCC)    FSGS (focal segmental glomerulosclerosis)    Gastritis    GI bleed    Heart murmur    Hyperlipidemia    Hypertension    Hypothyroidism    Hypothyroidism    unspecified   MRSA (methicillin resistant Staphylococcus aureus)    at abdominal wound.  Jan 2017.   Treated.    Neuropathy    Neuropathy in diabetes (HCC)    Obesity    Pedal edema    Psoriasis    Shortness of breath dyspnea    Sleep apnea    CPAP   Vertigo    Medications:  Warfarin 1.5mg  daily; last dose was 12/4 - pharmacy tech confirmed dose with patient's daughter  Assessment: 69yo female with a past medical history of anemia, anxiety, diabetes, CHF, CKD ESRD (hemodialysis MWF), hypertension, obesity, presents to the emergency department for shortness of breath. Patient was on Eliquis in the past and was switched to warfarin around October 2021. Pharmacy has been consulted for warfarin dosing and monitoring for afib and s/p TAVR (2019). Patient follows at the [facility] Spoke with patient's daughter - she stated patient was previously on warfarin 2mg  and was changed to 1mg  around 12/19/19 due to having darker stools, then patient was recently changed to warfarin 1.5mg  12/2. CHADSVASc of 5. Plan to bridge warfarin with heparin gtt. Warfarin is not dialyzed.   DDI: amiodarone and levothyroxine   HDW 78.1 kg   12/6 2255 HL 0.14  increase Heparin to 1200 units/hr  12/7 0704 HL 0.22  1000 unit bolus and increase heparin infusion to 1350 units/hr.  12/8 0409 HL 0.22 1000 unit bolus and increase heparin infusion to 1550 units/hr 12/8 1356 HL 0.41 12/8 2248 HL 0.11 increase heparin infusion to 1700 units/hr. Seem to be a lab drawn error.  12/9 0829 HL 0.52, therapeutic x 1 12/9 1728 HL 0.37, therapeutic x 2 12/10 0534 HL 0.40, therapeutic x 3 12/11 0322 HL 0.52, therapeutic x 4 12/12 0412 HL < 0.10, subtherapeutic 12/12 1453 HL=0.21  Subtherapeutic 12/13 0022 HL=0.87, Supratherapeutic  Date INR Dose 12/5 1.1 Not given 12/6  1.1 3 mg  12/7 1.1 3 mg - not charted given.  12/8 1.1 5 mg - not charted given  12/9 1.2 5 mg 12/10  1.1 5 mg - not charted until 12/11 @ 0008 12/11 1.3 5mg  12/12 1.5 7 mg 12/13  2.2 4 mg   Goal of Therapy:  INR 2-3 for warfarin   Heparin level 0.3 - 0.7.  Monitor platelets by anticoagulation protocol: Yes   Plan:  Heparin:   12/13 0022 HL=0.87, Supratherapeutic  Will hold heparin infusion for ~ 30 minutes and restart at 1750 units/hr. Recheck heparin level q8hr until therapeutic, then daily. CBC daily while on heparin.   Warfarin:  INR therapeutic. Due to increase from 1.5 to 2.2, will back down on give warfarin 4 mg tonight x 1. Daily INR ordered.  Renda Rolls, PharmD, MBA 01/31/2020 1:01 AM

## 2020-01-31 NOTE — TOC Progression Note (Signed)
Transition of Care [facility]) - Progression Note    Patient Details  Name: [name] MRN: [id number] Date of Birth: 07/22/1950  Transition of Care [facility]) CM/SW [place], LCSW Phone Number: 01/31/2020, 1:02 PM  Clinical Narrative:   CSW reached out to [facility], and [facility] to see if they can accommodate patient for HHPT, waiting for reply. Per TOC notes, no other agency able to accommodate and plan for OPPT if [place] cannot be arranged.    Expected Discharge Plan: [place] Barriers to Discharge: Continued Medical Work up  Expected Discharge Plan and Services Expected Discharge Plan: [place] In-house Referral: NA   Post Acute Care Choice: [place] arrangements for the past 2 months: Single Family Home                 DME Arranged: Oxygen DME Agency: [employer]                   Social Determinants of Health (SDOH) Interventions    Readmission Risk Interventions Readmission Risk Prevention Plan 01/28/2020 09/09/2019 06/14/2019  Transportation Screening Complete Complete Complete  PCP or Specialist Appt within 5-7 Days - - -  PCP or Specialist Appt within 3-5 Days - Complete Complete  Home Care Screening - - -  Medication Review (RN CM) - - -  [place] or [place] - Complete Complete  Social Work Consult for [place] Planning/Counseling - Complete Complete  Palliative Care Screening - Not Applicable Not Applicable  Medication Review Press photographer) (No Data) Complete Complete  PCP or Specialist appointment within 3-5 days of discharge Complete - -  [place] or Home Care Consult Complete - -  SW Recovery Care/Counseling Consult Complete - -  Palliative Care Screening Not Applicable - -  [place] Not Applicable - -  Some recent data might be hidden

## 2020-01-31 NOTE — Progress Notes (Signed)
PROGRESS NOTE    [name]  DJS:[id number] DOB: [birth year] DOA: 01/23/2020 PCP: Adin Hector, MD   Brief Narrative: Taken from H&P  [name] is a 69 y.o. female with medical history significant for insulin-dependent diabetes mellitus type 2, heart failure with reduced ejection fraction, end-stage renal disease on hemodialysis, obstructive sleep apnea not wearing CPAP, hypertension, aortic atherosclerosis, persistent atrial fibrillation, status post TAVR, morbid obesity, acquired hypothyroid, history of focal segmental glomerulosclerosis, presented to the emergency department via EMS for chief concerns of shortness of breath.   Shortness of breath that started progressively worsened throughout the day 01/22/20. Daughter denies fever, nausea, vomiting, chest pain, abdominal pain, dysuria, hematuria, diarrhea, blood in her stool. Patient and daughter states that she used her home oxygen without much relief.   Family endorses new increased productive cough with yellow mucus throughout the day. Endorses increased swelling in her legs.   Patient has frequent recent hospitalizations at [facility] with similar symptoms.  Also found to have reduced EF to 25% on repeat echo and a valvular thrombus.  Her Eliquis was transitioned to warfarin.  Also recent GI bleed with aspirin and warfarin, initially discontinue both and then later started on warfarin only. She was also recommended to have dialysis 4 times a week to prevent volume overload.  Hardly makes any urine, volume is being managed with dialysis.  Cardiology was consulted.  Subjective: Continues w/ dyspnea but up in bed, dyspnea somewhat improved, o2 has been weaned down. Fatigued. Has appetite.   Assessment & Plan:   Principal Problem:   Acute hypoxemic respiratory failure (HCC) Active Problems:   Obesity   Anemia   Benign essential HTN   Acquired atrophy of thyroid   FSGS (focal segmental glomerulosclerosis)   Acute kidney injury  superimposed on chronic kidney disease (HCC)   S/P TAVR (transcatheter aortic valve replacement)   DM type 2 with diabetic peripheral neuropathy (HCC)   Acute respiratory failure with hypoxia (HCC)   DDD (degenerative disc disease), lumbosacral   OSA (obstructive sleep apnea)  Acute on chronic hypoxic respiratory failure.  Most likely cardiogenic pulmonary edema/volume overload secondary volume overload from inadequate dialysis, complicated by hfref. Makes minimal urine. On 12/7 required angioplasty of brachiocephalic fistula. Baseline 3-5 L [place] O2, currently weaned down to 4  L. Recent echocardiogram with worsening of her EF to 25%, she also had right and left cardiac catheterization recently done at [facility]. 1000 ml ultrafiltrated 12/11, dialysis session stopped short due to hypotension -Volume is being managed with dialysis, plan for dialysis today -Might need more frequent dialysis to prevent recurrent hospitalizations with similar symptoms.  Right lung base opacity Given ersistent productive cough repeated cxr which shows persistent opacity. atelectasis favored and patient does not have fevers or elevated wbc count. No hx copd. Anticoagulated so pe unlikely, didn't respond to duonebs.  - shared decision w/ patient to treat for possible pna, ceftriaxone started 12/12 - continue IS  HFrEF  Most recent EF 20%. -Cardiology was consulted-appreciate their recommendations -defer institution of evidence-based meds to cardiology. They are advising a conservative approach w/o change in current therapeutics - volume mgmt via dialysis  ESRD on HD(MWF).   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 Fistula is deep complicating access. - next dialysis 12/13  Persistent A. fib.  She was previously on Eliquis which was transitioned to warfarin due to history of aortic valve thrombosis. Normal HR. TSH wnl. -Continue with amiodarone -Continue warfarin per pharmacy, on heparin  until INR  therapeutic. INR up to 2.2 today  Hyperkalemia Resolved w/ dialysis  Hyponatremia 2/2 inadequate dialysis, improved to 133 with dialysis  - monitor  History of chronic pain. -Continue home oxycodone  Aortic stenosis s/p TAVR.  History of recent aortic valve thrombosis. -Continue anticoagulation as above  Insulin-dependent diabetes mellitus. Well controlled, a1c 6.8 08/2019 -Continue SSI, decreased home lantus to 35   Obstructive sleep apnea.  She is unable to tolerate auto CPAP. She will need outpatient pulmonology referral for further recommendations.  Hypothyroidism. TSH wnl -Continue home Synthroid.  Constipation - Standing miralax/senna, increased miralax dose to 54 daily, added lactulose prn  Debility - PT/OT consulted  Morbid obesity. Body mass index is 41.86 kg/m.  This will complicate overall prognosis.  Objective: Vitals:   01/31/20 0108 01/31/20 0548 01/31/20 0814 01/31/20 1158  BP: (!) 143/99 117/71 119/71 (!) 142/90  Pulse: 98 88 85 90  Resp: 18 18 16 14   Temp: 98.2 F (36.8 C) 98 F (36.7 C) 97.8 F (36.6 C) 97.7 F (36.5 C)  TempSrc:   Oral Oral  SpO2: 97% 99% 99% 93%  Weight:      Height:        Intake/Output Summary (Last 24 hours) at 01/31/2020 1326 Last data filed at 01/30/2020 1500 Gross per 24 hour  Intake 110.84 ml  Output --  Net 110.84 ml   Filed Weights   01/26/20 0305 01/27/20 0500 01/28/20 0427  Weight: 106.9 kg 106.8 kg 107.2 kg    Examination:  General.  Morbidly obese lady, mildly distressed. Pulmonary.  Bilateral basal crackles, exp wheezes, improved from yesterday CV.  Regular rate and rhythm, no JVD, rub or murmur. Abdomen.  Soft, nontender, nondistended, BS positive. Morbidly obese CNS.  Alert and oriented x3.  No focal neurologic deficit. Extremities.  No edema, no cyanosis, pulses intact and symmetrical. Psychiatry.  Judgment and insight appears normal.  DVT prophylaxis: Coumadin/heparin Code Status:  Full Family Communication: Discussed with patient Disposition Plan:  Status is: Inpatient  Remains inpatient appropriate because:Inpatient level of care appropriate due to severity of illness   Dispo: The patient is from: Home              Anticipated d/c is to: Home (patient declines SNF) with home health              Anticipated d/c date is: 1-2 days              Patient currently is not medically stable to d/c.   Consultants:   Cardiology  Nephrology  Procedures:  Antimicrobials:   Data Reviewed: I have personally reviewed following labs and imaging studies  CBC: Recent Labs  Lab 01/27/20 0829 01/28/20 0534 01/29/20 0322 01/30/20 0412 01/31/20 0023  WBC 7.1 7.2 8.6 9.0 11.0*  HGB 9.3* 9.6* 10.3* 9.3* 9.6*  HCT 32.4* 32.4* 33.8* 32.3* 32.1*  MCV 100.3* 99.4 96.8 99.7 98.5  PLT 147* 167 191 167 017   Basic Metabolic Panel: Recent Labs  Lab 01/27/20 0526 01/27/20 0829 01/28/20 0534 01/29/20 0322 01/30/20 0412 01/31/20 0023  NA 133* 131* 129* 133* 133* 133*  K 4.9 4.8 4.5 4.3 3.9 4.2  CL 93* 93* 90* 95* 93* 92*  CO2 27 27 26 28 31 29   GLUCOSE 74 59* 123* 149* 108* 124*  BUN 39* 42* 50* 20 14 23   CREATININE 5.38* 5.75* 6.95* 4.25* 3.65* 4.91*  CALCIUM 8.2* 8.1* 8.2* 8.2* 8.6* 8.9  PHOS 5.0* 5.7* 6.0*  --   --   --  GFR: Estimated Creatinine Clearance: 12.7 mL/min (A) (by C-G formula based on SCr of 4.91 mg/dL (H)). Liver Function Tests: Recent Labs  Lab 01/27/20 0829  ALBUMIN 3.3*   No results for input(s): LIPASE, AMYLASE in the last 168 hours. No results for input(s): AMMONIA in the last 168 hours. Coagulation Profile: Recent Labs  Lab 01/27/20 0526 01/28/20 0534 01/29/20 0322 01/30/20 0412 01/31/20 0023  INR 1.2 1.1 1.3* 1.5* 2.2*   Cardiac Enzymes: No results for input(s): CKTOTAL, CKMB, CKMBINDEX, TROPONINI in the last 168 hours. BNP (last 3 results) No results for input(s): PROBNP in the last 8760 hours. HbA1C: No results for  input(s): HGBA1C in the last 72 hours. CBG: Recent Labs  Lab 01/30/20 1136 01/30/20 1659 01/30/20 2038 01/31/20 0816 01/31/20 1159  GLUCAP 85 86 136* 84 100*   Lipid Profile: No results for input(s): CHOL, HDL, LDLCALC, TRIG, CHOLHDL, LDLDIRECT in the last 72 hours. Thyroid Function Tests: No results for input(s): TSH, T4TOTAL, FREET4, T3FREE, THYROIDAB in the last 72 hours. Anemia Panel: No results for input(s): VITAMINB12, FOLATE, FERRITIN, TIBC, IRON, RETICCTPCT in the last 72 hours. Sepsis Labs: No results for input(s): PROCALCITON, LATICACIDVEN in the last 168 hours.  Recent Results (from the past 240 hour(s))  Resp Panel by RT-PCR (Flu A&B, Covid) Nasopharyngeal Swab     Status: None   Collection Time: 01/23/20 11:43 AM   Specimen: Nasopharyngeal Swab; Nasopharyngeal(NP) swabs in vial transport medium  Result Value Ref Range Status   SARS Coronavirus 2 by RT PCR NEGATIVE NEGATIVE Final    Comment: (NOTE) SARS-CoV-2 target nucleic acids are NOT DETECTED.  The SARS-CoV-2 RNA is generally detectable in upper respiratory specimens during the acute phase of infection. The lowest concentration of SARS-CoV-2 viral copies this assay can detect is 138 copies/mL. A negative result does not preclude SARS-Cov-2 infection and should not be used as the sole basis for treatment or other patient management decisions. A negative result may occur with  improper specimen collection/handling, submission of specimen other than nasopharyngeal swab, presence of viral mutation(s) within the areas targeted by this assay, and inadequate number of viral copies(<138 copies/mL). A negative result must be combined with clinical observations, patient history, and epidemiological information. The expected result is Negative.  Fact Sheet for Patients:  EntrepreneurPulse.com.au  Fact Sheet for Healthcare Providers:  IncredibleEmployment.be  This test is no t yet  approved or cleared by the [place] FDA and  has been authorized for detection and/or diagnosis of SARS-CoV-2 by FDA under an Emergency Use Authorization (EUA). This EUA will remain  in effect (meaning this test can be used) for the duration of the COVID-19 declaration under Section 564(b)(1) of the Act, 21 U.S.C.section 360bbb-3(b)(1), unless the authorization is terminated  or revoked sooner.       Influenza A by PCR NEGATIVE NEGATIVE Final   Influenza B by PCR NEGATIVE NEGATIVE Final    Comment: (NOTE) The Xpert Xpress SARS-CoV-2/FLU/RSV plus assay is intended as an aid in the diagnosis of influenza from Nasopharyngeal swab specimens and should not be used as a sole basis for treatment. Nasal washings and aspirates are unacceptable for Xpert Xpress SARS-CoV-2/FLU/RSV testing.  Fact Sheet for Patients: EntrepreneurPulse.com.au  Fact Sheet for Healthcare Providers: IncredibleEmployment.be  This test is not yet approved or cleared by the [place] FDA and has been authorized for detection and/or diagnosis of SARS-CoV-2 by FDA under an Emergency Use Authorization (EUA). This EUA will remain in effect (meaning this test can be used)  for the duration of the COVID-19 declaration under Section 564(b)(1) of the Act, 21 U.S.C. section 360bbb-3(b)(1), unless the authorization is terminated or revoked.  Performed at [facility], [address] [place], [place] [address]      Radiology Studies: DG Chest 1 View  Result Date: 01/30/2020 CLINICAL DATA:  Dyspnea. EXAM: CHEST  1 VIEW COMPARISON:  Frontal and lateral views 01/28/2020. FINDINGS: Stable cardiomegaly. Prior TAVR. Unchanged mediastinal contours with diffuse atherosclerosis and aortic tortuosity. There is new volume loss in the right lung base with increasing opacity. Vascular congestion with slight worsening. No pneumothorax or large pleural effusion. Surgical clips in the  left axilla. IMPRESSION: 1. New volume loss in the right lung base with increasing opacity, favors atelectasis/partial collapse over pneumonia. 2. Stable cardiomegaly. Vascular congestion with slight worsening. Electronically Signed   By: Keith Rake M.D.   On: 01/30/2020 16:23    Scheduled Meds: . amiodarone  200 mg Oral Daily  . atorvastatin  40 mg Oral Daily  . azithromycin  250 mg Oral Daily  . Chlorhexidine Gluconate Cloth  6 each Topical Q0600  . DULoxetine  30 mg Oral Daily  . epoetin (EPOGEN/PROCRIT) injection  10,000 Units Intravenous Q M,W,F-HD  . guaiFENesin  600 mg Oral BID  . insulin aspart  0-6 Units Subcutaneous TID WC  . insulin glargine  17 Units Subcutaneous QHS  . levothyroxine  88 mcg Oral QAC breakfast  . losartan  25 mg Oral Daily  . midodrine  10 mg Oral Q M,W,F-HD  . polyethylene glycol  51 g Oral Daily  . senna-docusate  1 tablet Oral QHS  . Vitamin D (Ergocalciferol)  50,000 Units Oral Q Tue  . warfarin  4 mg Oral ONCE-1600  . Warfarin - Pharmacist Dosing Inpatient   Does not apply q1600   Continuous Infusions: . sodium chloride    . sodium chloride    . cefTRIAXone (ROCEPHIN)  IV Stopped (01/31/20 0030)  . heparin 1,750 Units/hr (01/31/20 1307)     LOS: 7 days   Time spent: 25 minutes.  Desma Maxim, MD [facility]  If 7PM-7AM, please contact night-coverage [email or web address]  01/31/2020, 1:26 PM

## 2020-01-31 NOTE — Consult Note (Signed)
[place] for Warfarin/Heparin Indication: afib s/p TAVR.   Allergies  Allergen Reactions   Buspirone Other (See Comments)    Weakness   Diltiazem Hcl Palpitations   Gabapentin Palpitations   Hydralazine Rash   Lisinopril Cough   Lovastatin Palpitations   Metformin Diarrhea   Pravastatin Other (See Comments)    Insomnia   Sitagliptin Other (See Comments)    Constipation   Tramadol Itching   Patient Measurements: Height: 5\' 3"  (160 cm) Weight: 107.2 kg (236 lb 4.8 oz) IBW/kg (Calculated) : 52.4  Vital Signs: Temp: 97.8 F (36.6 C) (12/13 0814) Temp Source: Oral (12/13 0814) BP: 119/71 (12/13 0814) Pulse Rate: 85 (12/13 0814)  Labs: Recent Labs    01/29/20 0322 01/30/20 0412 01/30/20 1453 01/31/20 0022 01/31/20 0023 01/31/20 0956  HGB 10.3* 9.3*  --   --  9.6*  --   HCT 33.8* 32.3*  --   --  32.1*  --   PLT 191 167  --   --  168  --   LABPROT 15.3* 17.8*  --   --  23.5*  --   INR 1.3* 1.5*  --   --  2.2*  --   HEPARINUNFRC 0.52 <0.10* 0.21* 0.87*  --  0.60  CREATININE 4.25* 3.65*  --   --  4.91*  --    Estimated Creatinine Clearance: 12.7 mL/min (A) (by C-G formula based on SCr of 4.91 mg/dL (H)).  Medical History: Past Medical History:  Diagnosis Date   Anemia    Anxiety    Aortic valve stenosis    Arrhythmia    atrial fibrillation   Arthritis    feet, legs   B12 deficiency    Bowel obstruction (HCC)    CHF (congestive heart failure) (HCC)    CKD (chronic kidney disease)    protein in urine   Colostomy in place [facility])    Diabetes mellitus without complication ([place])    Dialysis patient ([place])    T/Th/Sa   Diverticulitis large intestine    Dysrhythmia    Ectopic atrial tachycardia (HCC)    FSGS (focal segmental glomerulosclerosis)    Gastritis    GI bleed    Heart murmur    Hyperlipidemia    Hypertension    Hypothyroidism    Hypothyroidism    unspecified   MRSA  (methicillin resistant Staphylococcus aureus)    at abdominal wound.  Jan 2017.  Treated.    Neuropathy    Neuropathy in diabetes (HCC)    Obesity    Pedal edema    Psoriasis    Shortness of breath dyspnea    Sleep apnea    CPAP   Vertigo    Medications:  Warfarin 1.5mg  daily; last dose was 12/4 - pharmacy tech confirmed dose with patient's daughter  Assessment: 69yo female with a past medical history of anemia, anxiety, diabetes, CHF, CKD ESRD (hemodialysis MWF), hypertension, obesity, presents to the emergency department for shortness of breath. Patient was on Eliquis in the past and was switched to warfarin around October 2021. Pharmacy has been consulted for warfarin dosing and monitoring for afib and s/p TAVR (2019). Patient follows at the [facility]. Spoke with patient's daughter - she stated patient was previously on warfarin 2mg  and was changed to 1mg  around 12/19/19 due to having darker stools, then patient was recently changed to warfarin 1.5mg  12/2. CHADSVASc of 5. Plan to bridge warfarin with heparin gtt. Warfarin is not dialyzed.  DDI: amiodarone and levothyroxine   HDW 78.1 kg   12/6 2255 HL 0.14  increase Heparin to 1200 units/hr  12/7 0704 HL 0.22 1000 unit bolus and increase heparin infusion to 1350 units/hr.  12/8 0409 HL 0.22 1000 unit bolus and increase heparin infusion to 1550 units/hr 12/8 1356 HL 0.41 12/8 2248 HL 0.11 increase heparin infusion to 1700 units/hr. Seem to be a lab drawn error.  12/9 0829 HL 0.52, therapeutic x 1 12/9 1728 HL 0.37, therapeutic x 2 12/10 0534 HL 0.40, therapeutic x 3 12/11 0322 HL 0.52, therapeutic x 4 12/12 0412 HL < 0.10, subtherapeutic 12/12 1453 HL=0.21  Subtherapeutic 12/13 0022 HL=0.87, Supratherapeutic 12/13 0956 HL=0.60 Therapeutic x 1   Date INR Dose 12/5 1.1 Not given 12/6  1.1 3 mg  12/7 1.1 3 mg - not charted given.  12/8 1.1 5 mg - not charted given  12/9 1.2 5 mg 12/10  1.1 5 mg - not charted  until 12/11 @ 0008 12/11 1.3 5mg  12/12 1.5 7 mg 12/13  2.2 4 mg   Goal of Therapy:  INR 2-3 for warfarin  Heparin level 0.3 - 0.7.  Monitor platelets by anticoagulation protocol: Yes   Plan:  Heparin:   12/13 0956 HL=0.60 Therapeutic x 1  Willcontinue current rate of 1750 units/hr.   Recheck heparin level q8hr for confirmation, then daily.   CBC daily while on heparin.   Warfarin:  INR therapeutic. Due to increase from 1.5 to 2.2, will back down on give warfarin 4 mg tonight x 1. Daily INR ordered.  Lu Duffel, PharmD, BCPS Clinical Pharmacist 01/31/2020 10:25 AM

## 2020-02-01 LAB — CBC
HCT: 33.2 % — ABNORMAL LOW (ref 36.0–46.0)
Hemoglobin: 10.2 g/dL — ABNORMAL LOW (ref 12.0–15.0)
MCH: 29.8 pg (ref 26.0–34.0)
MCHC: 30.7 g/dL (ref 30.0–36.0)
MCV: 97.1 fL (ref 80.0–100.0)
Platelets: 187 10*3/uL (ref 150–400)
RBC: 3.42 MIL/uL — ABNORMAL LOW (ref 3.87–5.11)
RDW: 15.9 % — ABNORMAL HIGH (ref 11.5–15.5)
WBC: 10.9 10*3/uL — ABNORMAL HIGH (ref 4.0–10.5)
nRBC: 1.4 % — ABNORMAL HIGH (ref 0.0–0.2)

## 2020-02-01 LAB — PROTIME-INR
INR: 3.3 — ABNORMAL HIGH (ref 0.8–1.2)
Prothrombin Time: 32.7 seconds — ABNORMAL HIGH (ref 11.4–15.2)

## 2020-02-01 LAB — GLUCOSE, CAPILLARY
Glucose-Capillary: 83 mg/dL (ref 70–99)
Glucose-Capillary: 98 mg/dL (ref 70–99)

## 2020-02-01 MED ORDER — AMIODARONE HCL 200 MG PO TABS: 200.0000 mg | ORAL_TABLET | Freq: Every day | ORAL | Status: AC

## 2020-02-01 MED ORDER — AMOXICILLIN-POT CLAVULANATE ER 1000-62.5 MG PO TB12: 2.0000 | ORAL_TABLET | Freq: Two times a day (BID) | ORAL | 0 refills | Status: AC

## 2020-02-01 MED ORDER — WARFARIN SODIUM 2 MG PO TABS: 2.0000 mg | ORAL_TABLET | Freq: Every day | ORAL | 11 refills | Status: AC

## 2020-02-01 MED ORDER — WARFARIN SODIUM 1 MG PO TABS
1.5000 mg | ORAL_TABLET | Freq: Once | ORAL | Status: DC
  Filled 2020-02-01: qty 1

## 2020-02-01 NOTE — Discharge Instructions (Signed)
Shortness of Breath, Adult Shortness of breath means you have trouble breathing. Shortness of breath could be a sign of a medical problem. Follow these instructions at home:   Watch for any changes in your symptoms.  Do not use any products that contain nicotine or tobacco, such as cigarettes, e-cigarettes, and chewing tobacco.  Do not smoke. Smoking can cause shortness of breath. If you need help to quit smoking, ask your doctor.  Avoid things that can make it harder to breathe, such as: ? Mold. ? Dust. ? Air pollution. ? Chemical smells. ? Things that can cause allergy symptoms (allergens), if you have allergies.  Keep your living space clean. Use products that help remove mold and dust.  Rest as needed. Slowly return to your normal activities.  Take over-the-counter and prescription medicines only as told by your doctor. This includes oxygen therapy and inhaled medicines.  Keep all follow-up visits as told by your doctor. This is important. Contact a doctor if:  Your condition does not get better as soon as expected.  You have a hard time doing your normal activities, even after you rest.  You have new symptoms. Get help right away if:  Your shortness of breath gets worse.  You have trouble breathing when you are resting.  You feel light-headed or you pass out (faint).  You have a cough that is not helped by medicines.  You cough up blood.  You have pain with breathing.  You have pain in your chest, arms, shoulders, or belly (abdomen).  You have a fever.  You cannot walk up stairs.  You cannot exercise the way you normally do. These symptoms may represent a serious problem that is an emergency. Do not wait to see if the symptoms will go away. Get medical help right away. Call your local emergency services (911 in the [place] Do not drive yourself to the hospital. Summary  Shortness of breath is when you have trouble breathing enough air. It can be a sign of a  medical problem.  Avoid things that make it hard for you to breathe, such as smoking, pollution, mold, and dust.  Watch for any changes in your symptoms. Contact your doctor if you do not get better or you get worse. This information is not intended to replace advice given to you by your health care provider. Make sure you discuss any questions you have with your health care provider. Document Revised: 07/07/2017 Document Reviewed: 07/07/2017 Elsevier Patient Education  2020 Elsevier Inc.  

## 2020-02-01 NOTE — TOC Transition Note (Addendum)
Transition of Care [facility]) - CM/SW Discharge Note   Patient Details  Name: [name] MRN: [id number] Date of Birth: 01/29/1951  [facility] CM/SW Contact:  Magnus Ivan, LCSW Phone Number: 02/01/2020, 1:29 PM   Clinical Narrative:   Patient has orders to discharge home today. Unable to arrange [facility]. Spoke with patient's sister Susie who confirmed they can do Outpatient Therapy, she prefers [facility] in [place]. Provided contact information for [place] reported they will call and make an appointment. Susie inquired about portable oxygen tank, patient has home oxygen through [employer]. CSW spoke with [employer] Representative Thedore Mins who reported a portable tank was delivered to patient's hospital room on 12/10. Family to transport home. No additional needs identified prior to discharge.   2:05- Call from patient's daughter who confirmed they are fine with OPPT at [facility] in [place]. CSW provided contact information. She denied additional needs prior to patient being discharged.  Final next level of care: OP Rehab Barriers to Discharge: Barriers Resolved   Patient Goals and CMS Choice Patient states their goals for this hospitalization and ongoing recovery are:: op rehab CMS Medicare.gov Compare Post Acute Care list provided to:: Patient Choice offered to / list presented to : [place]  Discharge Placement                Patient to be transferred to facility by: family Name of family member notified: [place] Patient and family notified of of transfer: 02/01/20  Discharge Plan and Services In-house Referral: NA   Post Acute Care Choice: Home Health          DME Arranged: Oxygen DME Agency: [employer]                  Social Determinants of Health ([place]) Interventions     Readmission Risk Interventions Readmission Risk Prevention Plan 01/28/2020 09/09/2019 06/14/2019  Transportation Screening Complete Complete Complete  PCP or  Specialist Appt within 5-7 Days - - -  PCP or Specialist Appt within 3-5 Days - Complete Complete  Home Care Screening - - -  Medication Review (RN CM) - - -  [place] or [place] - Complete Complete  Social Work Consult for [place] Planning/Counseling - Complete Complete  Palliative Care Screening - Not Applicable Not Applicable  Medication Review Press photographer) (No Data) Complete Complete  PCP or Specialist appointment within 3-5 days of discharge Complete - -  [place] or Home Care Consult Complete - -  SW Recovery Care/Counseling Consult Complete - -  Palliative Care Screening Not Applicable - -  [place] Not Applicable - -  Some recent data might be hidden

## 2020-02-01 NOTE — Progress Notes (Signed)
Received Md order to discharge patient to home with home o2, reviewed discharge instructions, prescriptions and follow up appointments with patient and patient verbalized understanding

## 2020-02-01 NOTE — Progress Notes (Signed)
Physical Therapy Treatment Patient Details Name: [name] MRN: [id number] DOB: 29-Jun-1950 Today's Date: 02/01/2020    History of Present Illness Pt is a 69 y.o. female with medical history significant for insulin-dependent diabetes mellitus type 2, heart failure with reduced ejection fraction, end-stage renal disease on hemodialysis, obstructive sleep apnea not wearing CPAP, hypertension, aortic atherosclerosis, persistent atrial fibrillation, status post TAVR, morbid obesity, acquired hypothyroid, history of focal segmental glomerulosclerosis, presented to the emergency department via EMS for chief concerns of shortness of breath.  MD assessment includes: Acute on chronic hypoxic respiratory failure, hyperkalemia, and debility.    PT Comments    Pt was pleasant and motivated to participate during the session and although the pt c/o increased fatigue this date she put forth good effort throughout.  Pt found on 4LO2/min with SpO2 in the mid to upper 90s and HR in the upper 90s to low 100s at rest.  SpO2 and HR measured frequently during the session and both remained WNL throughout.  Pt was steady with good eccentric and concentric control during sit to/from stand transfers from various height surfaces including low surface without arm rests.  Pt ambulated 20 feet x 3 with short seated therapeutic rest breaks between sessions and was steady throughout with only min lean on the RW for support.  Pt will benefit from HHPT services upon discharge to safely address deficits listed in patient problem list for decreased caregiver assistance and eventual return to PLOF.      Follow Up Recommendations  Home health PT;Supervision for mobility/OOB     Equipment Recommendations  Rolling walker with 5" wheels    Recommendations for Other Services       Precautions / Restrictions Precautions Precautions: Fall Restrictions Weight Bearing Restrictions: No    Mobility  Bed Mobility Overal bed  mobility: Modified Independent             General bed mobility comments: Min extra effort with bed mobility tasks but no physical assist required  Transfers Overall transfer level: Needs assistance Equipment used: Rolling walker (2 wheeled) Transfers: Sit to/from Stand Sit to Stand: Supervision         General transfer comment: Good eccentric and concentric control during sit to/from stand transfer training from multiple height surfaces  Ambulation/Gait Ambulation/Gait assistance: Supervision Gait Distance (Feet): 20 Feet x 3 Assistive device: Rolling walker (2 wheeled) Gait Pattern/deviations: Step-through pattern;Decreased step length - right;Decreased step length - left Gait velocity: decreased   General Gait Details: Slow cadence but steady without LOB with SpO2 and HR WNL on 4LO2/min   Stairs             Wheelchair Mobility    Modified Rankin (Stroke Patients Only)       Balance Overall balance assessment: Needs assistance Sitting-balance support: Feet supported Sitting balance-Leahy Scale: Good     Standing balance support: During functional activity;Bilateral upper extremity supported Standing balance-Leahy Scale: Good                              Cognition Arousal/Alertness: Awake/alert Behavior During Therapy: WFL for tasks assessed/performed Overall Cognitive Status: Within Functional Limits for tasks assessed                                        Exercises Total Joint Exercises Ankle Circles/Pumps: Strengthening;Both;10 reps;15 reps (with  manual resistance) Quad Sets: Strengthening;Both;10 reps;5 reps Gluteal Sets: Strengthening;Both;10 reps;5 reps Heel Slides: AROM;Strengthening;Both;10 reps Hip ABduction/ADduction: Strengthening;Both;10 reps (with manual resistance) Straight Leg Raises: Strengthening;Both;10 reps Long Arc Quad: AROM;Strengthening;Both;10 reps;15 reps Knee Flexion:  AROM;Strengthening;Both;10 reps;15 reps Other Exercises Other Exercises: Pt education provided on physiological benefits of activity and principles of activity progression    General Comments        Pertinent Vitals/Pain Pain Assessment: No/denies pain    Home Living                      Prior Function            PT Goals (current goals can now be found in the care plan section) Progress towards PT goals: Progressing toward goals    Frequency    Min 2X/week      PT Plan Current plan remains appropriate    Co-evaluation              AM-PAC PT "6 Clicks" Mobility   Outcome Measure  Help needed turning from your back to your side while in a flat bed without using bedrails?: None Help needed moving from lying on your back to sitting on the side of a flat bed without using bedrails?: None Help needed moving to and from a bed to a chair (including a wheelchair)?: A Little Help needed standing up from a chair using your arms (e.g., wheelchair or bedside chair)?: A Little Help needed to walk in hospital room?: A Little Help needed climbing 3-5 steps with a railing? : A Little 6 Click Score: 20    End of Session Equipment Utilized During Treatment: Gait belt;Oxygen Activity Tolerance: Patient tolerated treatment well Patient left: with call bell/phone within reach;in chair;with chair alarm set Nurse Communication: Mobility status PT Visit Diagnosis: Muscle weakness (generalized) (M62.81);Difficulty in walking, not elsewhere classified (R26.2)     Time: 3254-9826 PT Time Calculation (min) (ACUTE ONLY): 40 min  Charges:  $Gait Training: 8-22 mins $Therapeutic Exercise: 8-22 mins $Therapeutic Activity: 8-22 mins                     D. Scott Tim [place] PT, DPT 02/01/20, 11:28 AM

## 2020-02-01 NOTE — Discharge Summary (Signed)
[name] GGY:[id number] DOB: 07/25/1950 DOA: 01/23/2020  PCP: Adin Hector, MD  Admit date: 01/23/2020 Discharge date: 02/01/2020  Time spent: 35 minutes  Recommendations for Outpatient Follow-up:  1. Needs INR checked within 1 week 2. Close f/u with [facility] 3. Continue m/w/f dialysis     Discharge Diagnoses:  Principal Problem:   Acute hypoxemic respiratory failure (HCC) Active Problems:   Obesity   Anemia   Benign essential HTN   Acquired atrophy of thyroid   FSGS (focal segmental glomerulosclerosis)   Acute kidney injury superimposed on chronic kidney disease (HCC)   S/P TAVR (transcatheter aortic valve replacement)   DM type 2 with diabetic peripheral neuropathy (HCC)   Acute respiratory failure with hypoxia (HCC)   DDD (degenerative disc disease), lumbosacral   OSA (obstructive sleep apnea)   Discharge Condition: fair  Diet recommendation: low sodium heart healthy  Filed Weights   01/26/20 0305 01/27/20 0500 01/28/20 0427  Weight: 106.9 kg 106.8 kg 107.2 kg    History of present illness:  [name] a 69 y.o.femalewith medical history significant forinsulin-dependent diabetes mellitus type 2, heart failure with reduced ejection fraction, end-stage renal disease on hemodialysis, obstructive sleep apnea not wearing CPAP, hypertension, aortic atherosclerosis, persistent atrial fibrillation, status post TAVR, morbid obesity, acquired hypothyroid, history of focal segmental glomerulosclerosis, presented to the emergency department via EMS for chief concerns of shortness of breath.  Shortness of breath that started progressively worsened throughout the day 01/22/20. Daughter denies fever, nausea, vomiting, chest pain, abdominal pain, dysuria, hematuria, diarrhea, blood in her stool. Patient and daughter states that she used her home oxygen without much relief.  Family endorses new increased productive cough with yellow mucus throughout the day.  Endorses increased swelling in her legs.  Patient has frequent recent hospitalizations at [facility] with similar symptoms.  Also found to have reduced EF to 25% on repeat echo and a valvular thrombus.  Her Eliquis was transitioned to warfarin.  Also recent GI bleed with aspirin and warfarin, initially discontinue both and then later started on warfarin only. She was also recommended to have dialysis 4 times a week to prevent volume overload.  Hardly makes any urine, volume is being managed with dialysis.  Hospital Course:  Acute on chronic hypoxic respiratory failure.  Most likely cardiogenic pulmonary edema/volume overload secondary volume overload from inadequate dialysis, complicated by hfref. Makes minimal urine. On 12/7 required angioplasty of brachiocephalic fistula. Baseline 3-5 L [place] O2, currently weaned down to 4  L. Recent echocardiogram with worsening of her EF to 20%, she also had right and left cardiac catheterization recently done at [facility]. Had adequate dialysis session w/ 2 L ultrafiltration on 12/13 -Volume is being managed with dialysis, plan to continue mwf dialysis -Might need more frequent dialysis to prevent recurrent hospitalizations with similar symptoms.  Right lung base opacity Given persistent productive cough repeated cxr which shows persistent opacity. atelectasis favored and patient does not have fevers or elevated wbc count. No hx copd. Anticoagulated so pe unlikely, didn't respond to duonebs.  - shared decision w/ patient to treat for possible pna, ceftriaxone started 12/12 - continue augmentin 4 more days  HFrEF  Most recent EF 20%. -Cardiology was consulted-appreciate their recommendations -defer institution of evidence-based meds to cardiology. They are advising a conservative approach w/o change in current therapeutics - volume mgmt via dialysis - outpt f/u w/ [facility] cardiologist  ESRD on HD(MWF).   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  Fistula  is deep complicating access. Last dialysis 12/13  Persistent A. fib.  She was previously on Eliquis which was transitioned to warfarin due to history of aortic valve thrombosis. Normal HR. TSH wnl. -Continue with amiodarone - home on warfarin 2 mg daily - needs repeat inr within a week  Hyperkalemia Resolved w/ dialysis  Hyponatremia 2/2 inadequate dialysis, improved to 133 with dialysis   History of chronic pain. -Continued home oxycodone  Aortic stenosis s/p TAVR.  History of recent aortic valve thrombosis. -Continued anticoagulation as above  Insulin-dependent diabetes mellitus. Well controlled, a1c 6.8 08/2019  Obstructive sleep apnea.  She is unable to tolerate auto CPAP. She will need outpatient pulmonology referral for further recommendations.  Hypothyroidism. TSH wnl -Continued home Synthroid.  Debility - PT/OT consulted. Qualifies for snf but pt declines, we will arrange home pt  Morbid obesity. Body mass index is 41.86 kg/m.  This will complicate overall prognosis.  Procedures:  bracheocephalic fistula angioplasty  Consultations:  Cardiology, nephrology, vascular surgery  Discharge Exam: Vitals:   02/01/20 1152 02/01/20 1155  BP: (!) 92/52 99/72  Pulse: (!) 116 64  Resp: 16   Temp: 97.8 F (36.6 C)   SpO2: 99% 99%    General.  Morbidly obese lady, mildly distressed. Pulmonary.  Bilateral basal crackles, exp wheezes, improved from yesterday CV.  Regular rate and rhythm, no JVD, rub or murmur. Abdomen.  Soft, nontender, nondistended, BS positive. Morbidly obese CNS.  Alert and oriented x3.  No focal neurologic deficit. Extremities.  No edema, no cyanosis, pulses intact and symmetrical. Psychiatry.  Judgment and insight appears normal.  Discharge Instructions   Discharge Instructions    Diet - low sodium heart healthy   Complete by: As directed    Increase activity slowly   Complete by: As directed    No wound care    Complete by: As directed      Allergies as of 02/01/2020      Reactions   Buspirone Other (See Comments)   Weakness   Diltiazem Hcl Palpitations   Gabapentin Palpitations   Hydralazine Rash   Lisinopril Cough   Lovastatin Palpitations   Metformin Diarrhea   Pravastatin Other (See Comments)   Insomnia   Sitagliptin Other (See Comments)   Constipation   Tramadol Itching      Medication List    STOP taking these medications   oxyCODONE-acetaminophen 5-325 MG tablet Commonly known as: Percocet     TAKE these medications   amiodarone 200 MG tablet Commonly known as: PACERONE Take 1 tablet (200 mg total) by mouth daily. What changed: how much to take   amoxicillin-clavulanate 1000-62.5 MG 12 hr tablet Commonly known as: Augmentin XR Take 2 tablets by mouth 2 (two) times daily for 4 days.   atorvastatin 40 MG tablet Commonly known as: LIPITOR Take 40 mg by mouth daily.   calcium acetate 667 MG capsule Commonly known as: PHOSLO Take 1,334 mg by mouth 2 (two) times daily.   DULoxetine 30 MG capsule Commonly known as: CYMBALTA Take 30 mg by mouth daily.   Dupixent 300 MG/2ML prefilled syringe Generic drug: dupilumab Inject 300 mg into the skin every 14 (fourteen) days.   HumaLOG KwikPen 100 UNIT/ML KwikPen Generic drug: insulin lispro Inject 0.22-0.38 mLs (22-38 Units total) into the skin See admin instructions. Inject 22u under the skin before breakfast and inject 38u under the skin before supper What changed:   how much to take  additional instructions   insulin glargine 100 unit/mL  Sopn Commonly known as: LANTUS Inject 5 Units into the skin at bedtime as needed (based upon blood glucose reading).   levothyroxine 88 MCG tablet Commonly known as: SYNTHROID Take 88 mcg by mouth daily before breakfast.   midodrine 10 MG tablet Commonly known as: PROAMATINE Take 10 mg by mouth See admin instructions. Take 1 tablet (10mg ) twice daily before and after  dialysis on Monday, Wednesday and Friday   oxyCODONE 5 MG immediate release tablet Commonly known as: Oxy IR/ROXICODONE Take 1 tablet (5 mg total) by mouth every 8 (eight) hours as needed for severe pain. Must last 30 days   oxyCODONE 5 MG immediate release tablet Commonly known as: Oxy IR/ROXICODONE Take 1 tablet (5 mg total) by mouth every 8 (eight) hours as needed for severe pain. Must last 30 days   oxyCODONE 5 MG immediate release tablet Commonly known as: Oxy IR/ROXICODONE Take 1 tablet (5 mg total) by mouth every 8 (eight) hours as needed for severe pain. Must last 30 days   polyethylene glycol 17 g packet Commonly known as: MIRALAX / GLYCOLAX Take 17 g by mouth daily as needed for moderate constipation.   torsemide 20 MG tablet Commonly known as: DEMADEX Take 40 mg by mouth daily.   Vitamin D (Ergocalciferol) 1.25 MG (50000 UNIT) Caps capsule Commonly known as: DRISDOL Take 50,000 Units by mouth every Tuesday.   warfarin 2 MG tablet Commonly known as: Coumadin Take 1 tablet (2 mg total) by mouth daily. What changed:   medication strength  how much to take  when to take this      Allergies  Allergen Reactions   Buspirone Other (See Comments)    Weakness   Diltiazem Hcl Palpitations   Gabapentin Palpitations   Hydralazine Rash   Lisinopril Cough   Lovastatin Palpitations   Metformin Diarrhea   Pravastatin Other (See Comments)    Insomnia   Sitagliptin Other (See Comments)    Constipation   Tramadol Itching    Follow-up Information    Kris Hartmann, NP Follow up in 1 month(s).   Specialty: Vascular Surgery Why: Patient will need HDA with visit.  Contact information: [place] [address] [phone]        Adin Hector, MD. Schedule an appointment as soon as possible for a visit.   Specialty: Internal Medicine Why: make an appointment within 1 week to check your INR Contact information: [place] [address] [phone]        Your [facility] cardiologist. Schedule an appointment as soon as possible for a visit.                The results of significant diagnostics from this hospitalization (including imaging, microbiology, ancillary and laboratory) are listed below for reference.    Significant Diagnostic Studies: DG Chest 1 View  Result Date: 01/30/2020 CLINICAL DATA:  Dyspnea. EXAM: CHEST  1 VIEW COMPARISON:  Frontal and lateral views 01/28/2020. FINDINGS: Stable cardiomegaly. Prior TAVR. Unchanged mediastinal contours with diffuse atherosclerosis and aortic tortuosity. There is new volume loss in the right lung base with increasing opacity. Vascular congestion with slight worsening. No pneumothorax or large pleural effusion. Surgical clips in the left axilla. IMPRESSION: 1. New volume loss in the right lung base with increasing opacity, favors atelectasis/partial collapse over pneumonia. 2. Stable cardiomegaly. Vascular congestion with slight worsening. Electronically Signed   By: Keith Rake M.D.   On: 01/30/2020 16:23   DG Chest 2  View  Result Date: 01/28/2020 CLINICAL DATA:  Shortness of breath EXAM: CHEST - 2 VIEW COMPARISON:  01/23/2020, 09/07/2019 FINDINGS: Clips over the left axillary region. Surgical hardware in the cervical spine. Cardiomegaly with mild central vascular congestion. Minimal right perihilar opacity. Aortic valve prosthesis. IMPRESSION: 1. Cardiomegaly with mild central congestion. 2. Minimal right perihilar opacity, atelectasis versus minimal infiltrate. Electronically Signed   By: Donavan Foil M.D.   On: 01/28/2020 21:01   PERIPHERAL VASCULAR CATHETERIZATION  Result Date: 01/25/2020 See OP Note  DG Chest Portable 1 View  Result Date: 01/23/2020 CLINICAL DATA:  Increasing shortness of breath. History of CHF and hypertension. EXAM: PORTABLE CHEST 1 VIEW COMPARISON:  09/07/2019 FINDINGS: Cardiac silhouette is mildly enlarged. Prosthetic  endovascular aortic valve. No mediastinal or hilar masses. Lungs demonstrate vascular congestion with mild interstitial prominence similar to the prior radiograph. No lung consolidation. No convincing pleural effusion and no pneumothorax. Stable surgical vascular clips project over the left upper hemithorax. Previous cervical spine fusion, stable. Skeletal structures are grossly intact. IMPRESSION: 1. Cardiomegaly vascular congestion and mild interstitial prominence. Suspect mild congestive heart failure. No evidence of pneumonia. Electronically Signed   By: Lajean Manes M.D.   On: 01/23/2020 12:19    Microbiology: Recent Results (from the past 240 hour(s))  Resp Panel by RT-PCR (Flu A&B, Covid) Nasopharyngeal Swab     Status: None   Collection Time: 01/23/20 11:43 AM   Specimen: Nasopharyngeal Swab; Nasopharyngeal(NP) swabs in vial transport medium  Result Value Ref Range Status   SARS Coronavirus 2 by RT PCR NEGATIVE NEGATIVE Final    Comment: (NOTE) SARS-CoV-2 target nucleic acids are NOT DETECTED.  The SARS-CoV-2 RNA is generally detectable in upper respiratory specimens during the acute phase of infection. The lowest concentration of SARS-CoV-2 viral copies this assay can detect is 138 copies/mL. A negative result does not preclude SARS-Cov-2 infection and should not be used as the sole basis for treatment or other patient management decisions. A negative result may occur with  improper specimen collection/handling, submission of specimen other than nasopharyngeal swab, presence of viral mutation(s) within the areas targeted by this assay, and inadequate number of viral copies(<138 copies/mL). A negative result must be combined with clinical observations, patient history, and epidemiological information. The expected result is Negative.  Fact Sheet for Patients:  EntrepreneurPulse.com.au  Fact Sheet for Healthcare Providers:   IncredibleEmployment.be  This test is no t yet approved or cleared by the [place] FDA and  has been authorized for detection and/or diagnosis of SARS-CoV-2 by FDA under an Emergency Use Authorization (EUA). This EUA will remain  in effect (meaning this test can be used) for the duration of the COVID-19 declaration under Section 564(b)(1) of the Act, 21 U.S.C.section 360bbb-3(b)(1), unless the authorization is terminated  or revoked sooner.       Influenza A by PCR NEGATIVE NEGATIVE Final   Influenza B by PCR NEGATIVE NEGATIVE Final    Comment: (NOTE) The Xpert Xpress SARS-CoV-2/FLU/RSV plus assay is intended as an aid in the diagnosis of influenza from Nasopharyngeal swab specimens and should not be used as a sole basis for treatment. Nasal washings and aspirates are unacceptable for Xpert Xpress SARS-CoV-2/FLU/RSV testing.  Fact Sheet for Patients: EntrepreneurPulse.com.au  Fact Sheet for Healthcare Providers: IncredibleEmployment.be  This test is not yet approved or cleared by the [place] FDA and has been authorized for detection and/or diagnosis of SARS-CoV-2 by FDA under an Emergency Use Authorization (EUA). This EUA will remain in effect (  meaning this test can be used) for the duration of the COVID-19 declaration under Section 564(b)(1) of the Act, 21 U.S.C. section 360bbb-3(b)(1), unless the authorization is terminated or revoked.  Performed at [facility], [address] [place], [place] [address]      Labs: Basic Metabolic Panel: Recent Labs  Lab 01/27/20 0526 01/27/20 0829 01/28/20 0534 01/29/20 0322 01/30/20 0412 01/31/20 0023  NA 133* 131* 129* 133* 133* 133*  K 4.9 4.8 4.5 4.3 3.9 4.2  CL 93* 93* 90* 95* 93* 92*  CO2 27 27 26 28 31 29   GLUCOSE 74 59* 123* 149* 108* 124*  BUN 39* 42* 50* 20 14 23   CREATININE 5.38* 5.75* 6.95* 4.25* 3.65* 4.91*  CALCIUM 8.2* 8.1* 8.2* 8.2*  8.6* 8.9  PHOS 5.0* 5.7* 6.0*  --   --   --    Liver Function Tests: Recent Labs  Lab 01/27/20 0829  ALBUMIN 3.3*   No results for input(s): LIPASE, AMYLASE in the last 168 hours. No results for input(s): AMMONIA in the last 168 hours. CBC: Recent Labs  Lab 01/28/20 0534 01/29/20 0322 01/30/20 0412 01/31/20 0023 02/01/20 0811  WBC 7.2 8.6 9.0 11.0* 10.9*  HGB 9.6* 10.3* 9.3* 9.6* 10.2*  HCT 32.4* 33.8* 32.3* 32.1* 33.2*  MCV 99.4 96.8 99.7 98.5 97.1  PLT 167 191 167 168 187   Cardiac Enzymes: No results for input(s): CKTOTAL, CKMB, CKMBINDEX, TROPONINI in the last 168 hours. BNP: BNP (last 3 results) Recent Labs    09/07/19 1522 01/23/20 1143 01/27/20 0819  BNP 679.2* 694.9* 1,041.5*    ProBNP (last 3 results) No results for input(s): PROBNP in the last 8760 hours.  CBG: Recent Labs  Lab 01/31/20 1159 01/31/20 2108 01/31/20 2201 02/01/20 0844 02/01/20 1201  GLUCAP 100* 71 86 83 98       Signed:  Desma Maxim MD.  [facility] 02/01/2020, 12:30 PM

## 2020-02-01 NOTE — Progress Notes (Signed)
CSW informed by MD that patient is discharging today. No HH agency was able to accept patient. CSW attempted call to patient's daughter to determine OPPT preference. Left a voicemail requesting a return call as soon as possible.  Oleh Genin, [place]

## 2020-02-01 NOTE — Consult Note (Addendum)
[facility] for Warfarin/Heparin Indication: afib s/p TAVR.   Allergies  Allergen Reactions  . Buspirone Other (See Comments)    Weakness  . Diltiazem Hcl Palpitations  . Gabapentin Palpitations  . Hydralazine Rash  . Lisinopril Cough  . Lovastatin Palpitations  . Metformin Diarrhea  . Pravastatin Other (See Comments)    Insomnia  . Sitagliptin Other (See Comments)    Constipation  . Tramadol Itching   Patient Measurements: Height: 5\' 3"  (160 cm) Weight: 107.2 kg (236 lb 4.8 oz) IBW/kg (Calculated) : 52.4  Vital Signs: Temp: 98 F (36.7 C) (12/14 0535) BP: 109/83 (12/14 0535) Pulse Rate: 82 (12/14 0535)  Labs: Recent Labs    01/30/20 0412 01/30/20 1453 01/31/20 0022 01/31/20 0023 01/31/20 0956  HGB 9.3*  --   --  9.6*  --   HCT 32.3*  --   --  32.1*  --   PLT 167  --   --  168  --   LABPROT 17.8*  --   --  23.5*  --   INR 1.5*  --   --  2.2*  --   HEPARINUNFRC <0.10* 0.21* 0.87*  --  0.60  CREATININE 3.65*  --   --  4.91*  --    Estimated Creatinine Clearance: 12.7 mL/min (A) (by C-G formula based on SCr of 4.91 mg/dL (H)).  Medical History: Past Medical History:  Diagnosis Date  . Anemia   . Anxiety   . Aortic valve stenosis   . Arrhythmia    atrial fibrillation  . Arthritis    feet, legs  . B12 deficiency   . Bowel obstruction ([place])   . CHF (congestive heart failure) ([place])   . CKD (chronic kidney disease)    protein in urine  . Colostomy in place [facility])   . Diabetes mellitus without complication ([place])   . Dialysis patient [facility])    T/Th/Sa  . Diverticulitis large intestine   . Dysrhythmia   . Ectopic atrial tachycardia ([place])   . FSGS (focal segmental glomerulosclerosis)   . Gastritis   . GI bleed   . Heart murmur   . Hyperlipidemia   . Hypertension   . Hypothyroidism   . Hypothyroidism    unspecified  . MRSA (methicillin resistant Staphylococcus aureus)    at abdominal wound.  Jan 2017.  Treated.   .  Neuropathy   . Neuropathy in diabetes ([place])   . Obesity   . Pedal edema   . Psoriasis   . Shortness of breath dyspnea   . Sleep apnea    CPAP  . Vertigo    Medications:  Warfarin 1.5mg  daily; last dose was 12/4 - pharmacy tech confirmed dose with patient's daughter  Assessment: 69yo female with a past medical history of anemia, anxiety, diabetes, CHF, CKD ESRD (hemodialysis MWF), hypertension, obesity, presents to the emergency department for shortness of breath. Patient was on Eliquis in the past and was switched to warfarin around October 2021. Pharmacy has been consulted for warfarin dosing and monitoring for afib and s/p TAVR (2019). Patient follows at [facility]. Spoke with patient's daughter - she stated patient was previously on warfarin 2mg  and was changed to 1mg  around 12/19/19 due to having darker stools, then patient was recently changed to warfarin 1.5mg  12/2. CHADSVASc of 5. Plan to bridge warfarin with heparin gtt. Warfarin is not dialyzed.   DDI: amiodarone (dose reduced vs pta dose) and levothyroxine   HDW 78.1 kg  Date INR Dose 12/5 1.1 Not given 12/6  1.1 3 mg  12/7 1.1 3 mg - not charted given.  12/8 1.1 5 mg - not charted given  12/9 1.2 5 mg 12/10  1.1 5 mg - not charted until 12/11 @ 0008 12/11 1.3 5mg  12/12 1.5 7 mg 12/13  2.2 4 mg 12/14   3.3 1.5mg    Goal of Therapy:  INR 2-3 for warfarin  Heparin level 0.3 - 0.7.  Monitor platelets by anticoagulation protocol: Yes   Plan:  Heparin:   Stopped 12/13 per therapeutic INR  Warfarin:  INR supratherapeutic. Due to increase of INR, will  give warfarin 1.5 mg tonight x 1. Daily INR ordered.  Lu Duffel, PharmD, BCPS Clinical Pharmacist 02/01/2020 7:48 AM

## 2020-02-01 NOTE — Progress Notes (Addendum)
[name]  ROUNDING NOTE   Subjective:   Patient reports her shortness of breath is better today. She had dialysis yesterday with 2 L of ultrafiltration. She continues to require 4 L of O2 via nasal cannula.  Objective:  Vital signs in last 24 hours:  Temp:  [97.7 F (36.5 C)-98 F (36.7 C)] 97.8 F (36.6 C) (12/14 1152) Pulse Rate:  [63-116] 64 (12/14 1155) Resp:  [16-17] 16 (12/14 1152) BP: (92-144)/(47-99) 99/72 (12/14 1155) SpO2:  [96 %-100 %] 99 % (12/14 1155)  Weight change:  Filed Weights   01/26/20 0305 01/27/20 0500 01/28/20 0427  Weight: 106.9 kg 106.8 kg 107.2 kg    Intake/Output: I/O last 3 completed shifts: In: 500 [I.V.:300; IV Piggyback:200] Out: 2000 [Other:2000]   Intake/Output this shift:  No intake/output data recorded.  Physical Exam: General:  Resting in bed, appears calm and comfortable  Head:  Normocephalic, atraumatic  Eyes:  Anicteric  Lungs:   Respirations symmetrical and unlabored, on 4L [place], expiratory wheezes +  Heart:  S1-S2, no rubs or gallops  Abdomen:  Soft, nontender, non distended  Extremities:  No peripheral edema.  Neurologic:  Oriented  Skin: No acute lesions or rashes  Access: LUA AVF+ bruit, +thrill    Basic Metabolic Panel: Recent Labs  Lab 01/27/20 0526 01/27/20 0829 01/28/20 0534 01/29/20 0322 01/30/20 0412 01/31/20 0023  NA 133* 131* 129* 133* 133* 133*  K 4.9 4.8 4.5 4.3 3.9 4.2  CL 93* 93* 90* 95* 93* 92*  CO2 27 27 26 28 31 29   GLUCOSE 74 59* 123* 149* 108* 124*  BUN 39* 42* 50* 20 14 23   CREATININE 5.38* 5.75* 6.95* 4.25* 3.65* 4.91*  CALCIUM 8.2* 8.1* 8.2* 8.2* 8.6* 8.9  PHOS 5.0* 5.7* 6.0*  --   --   --     Liver Function Tests: Recent Labs  Lab 01/27/20 0829  ALBUMIN 3.3*   No results for input(s): LIPASE, AMYLASE in the last 168 hours. No results for input(s): AMMONIA in the last 168 hours.  CBC: Recent Labs  Lab 01/28/20 0534 01/29/20 0322 01/30/20 0412 01/31/20 0023  02/01/20 0811  WBC 7.2 8.6 9.0 11.0* 10.9*  HGB 9.6* 10.3* 9.3* 9.6* 10.2*  HCT 32.4* 33.8* 32.3* 32.1* 33.2*  MCV 99.4 96.8 99.7 98.5 97.1  PLT 167 191 167 168 187    Cardiac Enzymes: No results for input(s): CKTOTAL, CKMB, CKMBINDEX, TROPONINI in the last 168 hours.  BNP: Invalid input(s): POCBNP  CBG: Recent Labs  Lab 01/31/20 1159 01/31/20 2108 01/31/20 2201 02/01/20 0844 02/01/20 1201  GLUCAP 100* 71 86 83 98    Microbiology: Results for orders placed or performed during the hospital encounter of 01/23/20  Resp Panel by RT-PCR (Flu A&B, Covid) Nasopharyngeal Swab     Status: None   Collection Time: 01/23/20 11:43 AM   Specimen: Nasopharyngeal Swab; Nasopharyngeal(NP) swabs in vial transport medium  Result Value Ref Range Status   SARS Coronavirus 2 by RT PCR NEGATIVE NEGATIVE Final    Comment: (NOTE) SARS-CoV-2 target nucleic acids are NOT DETECTED.  The SARS-CoV-2 RNA is generally detectable in upper respiratory specimens during the acute phase of infection. The lowest concentration of SARS-CoV-2 viral copies this assay can detect is 138 copies/mL. A negative result does not preclude SARS-Cov-2 infection and should not be used as the sole basis for treatment or other patient management decisions. A negative result may occur with  improper specimen collection/handling, submission of specimen other than nasopharyngeal  swab, presence of viral mutation(s) within the areas targeted by this assay, and inadequate number of viral copies(<138 copies/mL). A negative result must be combined with clinical observations, patient history, and epidemiological information. The expected result is Negative.  Fact Sheet for Patients:  EntrepreneurPulse.com.au  Fact Sheet for Healthcare Providers:  IncredibleEmployment.be  This test is no t yet approved or cleared by the [place] FDA and  has been authorized for detection and/or  diagnosis of SARS-CoV-2 by FDA under an Emergency Use Authorization (EUA). This EUA will remain  in effect (meaning this test can be used) for the duration of the COVID-19 declaration under Section 564(b)(1) of the Act, 21 U.S.C.section 360bbb-3(b)(1), unless the authorization is terminated  or revoked sooner.       Influenza A by PCR NEGATIVE NEGATIVE Final   Influenza B by PCR NEGATIVE NEGATIVE Final    Comment: (NOTE) The Xpert Xpress SARS-CoV-2/FLU/RSV plus assay is intended as an aid in the diagnosis of influenza from Nasopharyngeal swab specimens and should not be used as a sole basis for treatment. Nasal washings and aspirates are unacceptable for Xpert Xpress SARS-CoV-2/FLU/RSV testing.  Fact Sheet for Patients: EntrepreneurPulse.com.au  Fact Sheet for Healthcare Providers: IncredibleEmployment.be  This test is not yet approved or cleared by the [place] FDA and has been authorized for detection and/or diagnosis of SARS-CoV-2 by FDA under an Emergency Use Authorization (EUA). This EUA will remain in effect (meaning this test can be used) for the duration of the COVID-19 declaration under Section 564(b)(1) of the Act, 21 U.S.C. section 360bbb-3(b)(1), unless the authorization is terminated or revoked.  Performed at [facility], [address] [place], [place] [address]     Coagulation Studies: Recent Labs    01/30/20 0412 01/31/20 0023 02/01/20 0811  LABPROT 17.8* 23.5* 32.7*  INR 1.5* 2.2* 3.3*    Urinalysis: No results for input(s): COLORURINE, LABSPEC, PHURINE, GLUCOSEU, HGBUR, BILIRUBINUR, KETONESUR, PROTEINUR, UROBILINOGEN, NITRITE, LEUKOCYTESUR in the last 72 hours.  Invalid input(s): APPERANCEUR    Imaging: DG Chest 1 View  Result Date: 01/30/2020 CLINICAL DATA:  Dyspnea. EXAM: CHEST  1 VIEW COMPARISON:  Frontal and lateral views 01/28/2020. FINDINGS: Stable cardiomegaly. Prior TAVR. Unchanged  mediastinal contours with diffuse atherosclerosis and aortic tortuosity. There is new volume loss in the right lung base with increasing opacity. Vascular congestion with slight worsening. No pneumothorax or large pleural effusion. Surgical clips in the left axilla. IMPRESSION: 1. New volume loss in the right lung base with increasing opacity, favors atelectasis/partial collapse over pneumonia. 2. Stable cardiomegaly. Vascular congestion with slight worsening. Electronically Signed   By: Keith Rake M.D.   On: 01/30/2020 16:23     Medications:   . sodium chloride    . sodium chloride    . cefTRIAXone (ROCEPHIN)  IV 1 g (01/31/20 2200)  . warfarin     . amiodarone  200 mg Oral Daily  . atorvastatin  40 mg Oral Daily  . azithromycin  250 mg Oral Daily  . Chlorhexidine Gluconate Cloth  6 each Topical Q0600  . DULoxetine  30 mg Oral Daily  . epoetin (EPOGEN/PROCRIT) injection  10,000 Units Intravenous Q M,W,F-HD  . guaiFENesin  600 mg Oral BID  . insulin aspart  0-6 Units Subcutaneous TID WC  . insulin glargine  17 Units Subcutaneous QHS  . levothyroxine  88 mcg Oral QAC breakfast  . losartan  25 mg Oral Daily  . midodrine  10 mg Oral Q M,W,F-HD  . polyethylene  glycol  51 g Oral Daily  . senna-docusate  1 tablet Oral QHS  . Vitamin D (Ergocalciferol)  50,000 Units Oral Q Tue  . Warfarin - Pharmacist Dosing Inpatient   Does not apply q1600   sodium chloride, sodium chloride, alteplase, heparin, lactulose, lidocaine (PF), lidocaine-prilocaine, ondansetron **OR** ondansetron (ZOFRAN) IV, oxyCODONE-acetaminophen **AND** oxyCODONE, pentafluoroprop-tetrafluoroeth  Assessment/ Plan:  [name] is a 69 y.o.  female with medical problems including Diabetes Type 2, HF with EF of 45%, ESRD on HD MWF, presented to the ED on 01/22/2020 for SOB.  [facility]/ [facility]/ MWF/ LUE AVF . EDW 108   # ESRD on HD with fluid overload  Patient received dialysis treatment yesterday with  ultrafiltration of 2 L Volume and electrolyte status acceptable No acute indication for additional dialysis We will continue Monday Wednesday Friday schedule  # Hyperkalemia K+ was 4.2 yesterday,lab results not available today   #Hyponatremia  Serum Sodium was 133 on 01/31/2020 Will continue to monitor   # Anemia of CKD  Lab Results  Component Value Date   HGB 10.2 (L) 02/01/2020  Patient is on Epogen 10,000 units MWF with dialysis treatments  # Secondary Hyperparathyroidism  Lab Results  Component Value Date   PTH 173 (H) 01/24/2020   CALCIUM 8.9 01/31/2020   CAION 1.14 (L) 01/29/2019   PHOS 6.0 (H) 01/28/2020    # DM type 2 with CKD  Lab Results  Component Value Date   HGBA1C 6.8 (H) 09/07/2019  Patient is on insulin aspart and insulin glargine   LOS: 8 Princy Raju 12/14/202112:07 PM  Patient was seen and examined with Crosby Oyster, DNP. Plan was discussed and agreed upon on the signing of this note.   Lavonia Dana, [facility]  12/14/20212:36 PM

## 2020-02-01 NOTE — Progress Notes (Signed)
Hemodialysis patient known at [facility] MWF 6:15am, patient is normally transported by daughter or sister. Please contact me with any dialysis placement concerns.  Elvera Bicker Dialysis Coordinator [phone]

## 2020-02-02 ENCOUNTER — Other Ambulatory Visit: Payer: Self-pay

## 2020-02-02 ENCOUNTER — Encounter: Payer: Self-pay | Admitting: Student in an Organized Health Care Education/Training Program

## 2020-02-02 ENCOUNTER — Ambulatory Visit
Payer: Medicare Other | Attending: Student in an Organized Health Care Education/Training Program | Admitting: Student in an Organized Health Care Education/Training Program

## 2020-02-02 DIAGNOSIS — M7918 Myalgia, other site: Secondary | ICD-10-CM

## 2020-02-02 DIAGNOSIS — G894 Chronic pain syndrome: Secondary | ICD-10-CM | POA: Diagnosis not present

## 2020-02-02 DIAGNOSIS — Z79899 Other long term (current) drug therapy: Secondary | ICD-10-CM | POA: Diagnosis not present

## 2020-02-02 DIAGNOSIS — R109 Unspecified abdominal pain: Secondary | ICD-10-CM | POA: Diagnosis not present

## 2020-02-02 DIAGNOSIS — M47816 Spondylosis without myelopathy or radiculopathy, lumbar region: Secondary | ICD-10-CM | POA: Diagnosis not present

## 2020-02-02 DIAGNOSIS — G8929 Other chronic pain: Secondary | ICD-10-CM

## 2020-02-02 MED ORDER — OXYCODONE HCL 5 MG PO TABS: 5.0000 mg | ORAL_TABLET | Freq: Three times a day (TID) | ORAL | 0 refills | Status: DC | PRN

## 2020-02-02 NOTE — Progress Notes (Signed)
Patient: [name]  Service Category: E/M  Provider: Gillis Santa, MD  DOB: June 04, 1950  DOS: 02/02/2020  Location: Office  MRN: [id number]  Setting: Ambulatory outpatient  Referring Provider: Adin Hector, MD  Type: Established Patient  Specialty: Interventional Pain Management  PCP: Adin Hector, MD  Location: Home  Delivery: TeleHealth     Virtual Encounter - Pain Management PROVIDER NOTE: Information contained herein reflects review and annotations entered in association with encounter. Interpretation of such information and data should be left to medically-trained personnel. Information provided to patient can be located elsewhere in the medical record under "Patient Instructions". Document created using STT-dictation technology, any transcriptional errors that may result from process are unintentional.    Contact & Pharmacy Preferred: [phone] Home: [phone] (home) Mobile: [phone] (mobile) E-mail: [name]  [id number] [place] [address]Phone: [phone]: [phone]  Pre-screening  [name] "in-person" vs "virtual" encounter. She indicated preferring virtual for this encounter.   Reason COVID-19*  Social distancing based on CDC and AMA recommendations.   I contacted [name] 02/02/2020 via telephone.      I clearly identified myself as BGillis Santa MD. I verified that I was speaking with the correct person using two identifiers (Name: [name] and date of birth: [birth year].  Consent I sought verbal advanced consent from Alicia Spikesfor virtual visit interactions. I informed [name] possible security and privacy concerns, risks, and limitations associated with providing "not-in-person" medical evaluation and management services. I also informed Ms. AZenia Residesof the availability of "in-person" appointments. Finally, I informed her that there would be a charge  for the virtual visit and that she could be  personally, fully or partially, financially responsible for it. [name] understanding and agreed to proceed.   Historic Elements   Ms. SGIZEL RIEDLINGERis a 69y.o. year old, female patient evaluated today after our last contact on 10/28/2019. [name] has a past medical history of Anemia, Anxiety, Aortic valve stenosis, Arrhythmia, Arthritis, B12 deficiency, Bowel obstruction ([place], CHF (congestive heart failure) ([place], CKD (chronic kidney disease), Colostomy in place ([facility], Diabetes mellitus without complication ([place], Dialysis patient ([facility], Diverticulitis large intestine, Dysrhythmia, Ectopic atrial tachycardia ([place], FSGS (focal segmental glomerulosclerosis), Gastritis, GI bleed, Heart murmur, Hyperlipidemia, Hypertension, Hypothyroidism, Hypothyroidism, MRSA (methicillin resistant Staphylococcus aureus), Neuropathy, Neuropathy in diabetes ([place], Obesity, Pedal edema, Psoriasis, Shortness of breath dyspnea, Sleep apnea, and Vertigo. She also  has a past surgical history that includes Colectomy; laparotomy closure of cecal perforation (05/09/2013); Tonsillectomy; Back surgery; Cardiac catheterization; Abdominal hysterectomy; Debridement of abdominal wall abscess (N/A, 02/22/2015); Excision mass abdominal (N/A, 02/24/2015); Application if wound vac (N/A, 02/24/2015); Excision mass abdominal (N/A, 02/26/2015); Application if wound vac (N/A, 02/26/2015); Wound debridement (N/A, 03/03/2015); Wound debridement (N/A, 03/09/2015); Flexible sigmoidoscopy (06/19/2015); Colostomy reversal (N/A, 07/12/2015); laparotomy (07/12/2015); laparoscopy (07/12/2015); Lysis of adhesion (07/12/2015); laparotomy (N/A, 02/06/2015); Colonoscopy with propofol (N/A, 09/04/2016); Cardiac valve replacement (08/2017); Cardioversion (N/A, 07/15/2018); DIALYSIS/PERMA CATHETER INSERTION (N/A, 10/20/2018); Cataract extraction w/PHACO (Right, 11/16/2018); Cataract extraction w/PHACO (Left, 12/07/2018);  Cholecystectomy; Hernia repair; AV fistula placement (Left, 01/29/2019); DIALYSIS/PERMA CATHETER REMOVAL (N/A, 08/03/2019); and A/V SHUNT INTERVENTION (N/A, 01/25/2020). Ms. ABadmanhas a current medication list which includes the following prescription(s): amiodarone, amoxicillin-clavulanate, atorvastatin, calcium acetate, duloxetine, dupixent, humalog kwikpen, insulin glargine, levothyroxine, midodrine, polyethylene glycol, torsemide, vitamin d (ergocalciferol), warfarin, [START ON 02/03/2020] oxycodone, [START ON 03/04/2020]  oxycodone, and [START ON 04/03/2020] oxycodone. She  reports that she quit smoking about 6 years ago. Her smoking use included cigarettes. She has never used smokeless tobacco. She reports that she does not drink alcohol and does not use drugs. [name] is allergic to buspirone, diltiazem hcl, gabapentin, hydralazine, lisinopril, lovastatin, metformin, pravastatin, sitagliptin, and tramadol.   HPI  Today, she is being contacted for medication management.   Unfortunately, since our last visit, [name] has had multiple hospitalizations related to shortness of breath, volume overload secondary to heart failure and end-stage renal disease.  Of note she had a shunt revision performed on 01/25/2020.  Her nephrologist, Dr. Holley Raring is planning on increasing her dialysis sessions to 4 times a week.  In regards to her pain, patient is requesting to be transitioned back to oxycodone that she was on before at 5 mg 3 times a day as needed.  She states that she does not obtain as much analgesic benefit with Percocet.  Otherwise, denies constipation, nausea, cognitive changes.  Pharmacotherapy Assessment   01/05/2020  10/28/2019   1  Oxycodone-Acetaminophen 5-325  90.00  30  Bi Lat  [id number]  Nor (4705)  0/0  22.50 MME  Medicare  [place]      Analgesic: Oxycodone 5 mg 3 times a day as needed, quantity 90/month; MME equals 22.5  Monitoring: [place] PMP: PDMP reviewed during this encounter.        Pharmacotherapy: No side-effects or adverse reactions reported. Compliance: No problems identified. Effectiveness: Clinically acceptable. Plan: Refer to "POC".  UDS:  Summary  Date Value Ref Range Status  07/06/2019 Note  Final    Comment:    ==================================================================== [employer]ox[employer] Select 13 (MW) ==================================================================== Test                             Result       Flag       Units Drug Present   Oxycodone                      469                     ng/mg creat   Oxymorphone                    903                     ng/mg creat   Noroxycodone                   290                     ng/mg creat   Noroxymorphone                 203                     ng/mg creat    Sources of oxycodone are scheduled prescription medications.    Oxymorphone, noroxycodone, and noroxymorphone are expected    metabolites of oxycodone. Oxymorphone is also available as a    scheduled prescription medication. ==================================================================== Test                      Result    Flag   Units      Ref Range   Creatinine  100              mg/dL      >=20 ==================================================================== Declared Medications:  Medication list was not provided. ==================================================================== For clinical consultation, please call [phone]. ====================================================================     Laboratory Chemistry Profile   Renal Lab Results  Component Value Date   BUN 23 01/31/2020   CREATININE 4.91 (H) 01/31/2020   LABCREA 26 07/18/2015   GFRAA 12 (L) 09/10/2019   GFRNONAA 9 (L) 01/31/2020     Hepatic Lab Results  Component Value Date   AST 22 01/23/2020   ALT 18 01/23/2020   ALBUMIN 3.3 (L) 01/27/2020   ALKPHOS 58 01/23/2020   LIPASE 21 02/04/2015     Electrolytes Lab  Results  Component Value Date   NA 133 (L) 01/31/2020   K 4.2 01/31/2020   CL 92 (L) 01/31/2020   CALCIUM 8.9 01/31/2020   MG 2.0 06/13/2019   PHOS 6.0 (H) 01/28/2020     Bone Lab Results  Component Value Date   25OHVITD1 43 11/07/2015   25OHVITD2 38 11/07/2015   25OHVITD3 5.4 11/07/2015     Inflammation (CRP: Acute Phase) (ESR: Chronic Phase) Lab Results  Component Value Date   CRP 0.6 11/07/2015   ESRSEDRATE 16 11/07/2015   LATICACIDVEN 0.8 02/21/2015       Note: Above Lab results reviewed.  Imaging  DG Chest 1 View CLINICAL DATA:  Dyspnea.  EXAM: CHEST  1 VIEW  COMPARISON:  Frontal and lateral views 01/28/2020.  FINDINGS: Stable cardiomegaly. Prior TAVR. Unchanged mediastinal contours with diffuse atherosclerosis and aortic tortuosity. There is new volume loss in the right lung base with increasing opacity. Vascular congestion with slight worsening. No pneumothorax or large pleural effusion. Surgical clips in the left axilla.  IMPRESSION: 1. New volume loss in the right lung base with increasing opacity, favors atelectasis/partial collapse over pneumonia. 2. Stable cardiomegaly. Vascular congestion with slight worsening.  Electronically Signed   By: Keith Rake M.D.   On: 01/30/2020 16:23  Assessment  The primary encounter diagnosis was Chronic pain syndrome. Diagnoses of Pharmacologic therapy, Chronic abdominal pain (Third area of Pain) (Bilateral) (L>R), Lumbar facet syndrome (Bilateral) (L>R), Lumbar spondylosis, Visceral abdominal pain, and Musculoskeletal pain were also pertinent to this visit.  Plan of Care   [name] has a current medication list which includes the following long-term medication(s): amiodarone, atorvastatin, duloxetine, humalog kwikpen, levothyroxine, warfarin, [START ON 02/03/2020] oxycodone, [START ON 03/04/2020] oxycodone, and [START ON 04/03/2020] oxycodone.  Pharmacotherapy (Medications Ordered): Meds ordered  this encounter  Medications  . oxyCODONE (OXY IR/ROXICODONE) 5 MG immediate release tablet    Sig: Take 1 tablet (5 mg total) by mouth every 8 (eight) hours as needed for severe pain. Must last 30 days    Dispense:  90 tablet    Refill:  0    Chronic Pain: STOP Act (Not applicable) Fill 1 day early if closed on refill date.  Marland Kitchen oxyCODONE (OXY IR/ROXICODONE) 5 MG immediate release tablet    Sig: Take 1 tablet (5 mg total) by mouth every 8 (eight) hours as needed for severe pain. Must last 30 days    Dispense:  90 tablet    Refill:  0    Chronic Pain: STOP Act (Not applicable) Fill 1 day early if closed on refill date.  Marland Kitchen oxyCODONE (OXY IR/ROXICODONE) 5 MG immediate release tablet    Sig: Take 1 tablet (5 mg total) by mouth every 8 (eight) hours as  needed for severe pain. Must last 30 days    Dispense:  90 tablet    Refill:  0    Chronic Pain: STOP Act (Not applicable) Fill 1 day early if closed on refill date.    Follow-up plan:   Return in about 3 months (around 05/02/2020) for Medication Management, in person.   Recent Visits No visits were found meeting these conditions. Showing recent visits within past 90 days and meeting all other requirements Today's Visits Date Type Provider Dept  02/02/20 Telemedicine Gillis Santa, MD [facility]  Showing today's visits and meeting all other requirements Future Appointments No visits were found meeting these conditions. Showing future appointments within next 90 days and meeting all other requirements  I discussed the assessment and treatment plan with the patient. The patient was provided an opportunity to ask questions and all were answered. The patient agreed with the plan and demonstrated an understanding of the instructions.  Patient advised to call back or seek an in-person evaluation if the symptoms or condition worsens.  Duration of encounter: 30 minutes.  Note by: Gillis Santa, MD Date: 02/02/2020; Time: 1:10 PM

## 2020-03-20 ENCOUNTER — Other Ambulatory Visit: Payer: Self-pay

## 2020-03-20 ENCOUNTER — Emergency Department: Payer: Medicare Other

## 2020-03-20 DIAGNOSIS — Z20822 Contact with and (suspected) exposure to covid-19: Secondary | ICD-10-CM | POA: Insufficient documentation

## 2020-03-20 DIAGNOSIS — R0602 Shortness of breath: Secondary | ICD-10-CM | POA: Diagnosis present

## 2020-03-20 DIAGNOSIS — I5043 Acute on chronic combined systolic (congestive) and diastolic (congestive) heart failure: Secondary | ICD-10-CM | POA: Diagnosis not present

## 2020-03-20 DIAGNOSIS — E1140 Type 2 diabetes mellitus with diabetic neuropathy, unspecified: Secondary | ICD-10-CM | POA: Diagnosis not present

## 2020-03-20 DIAGNOSIS — E1122 Type 2 diabetes mellitus with diabetic chronic kidney disease: Secondary | ICD-10-CM | POA: Insufficient documentation

## 2020-03-20 DIAGNOSIS — Z79899 Other long term (current) drug therapy: Secondary | ICD-10-CM | POA: Diagnosis not present

## 2020-03-20 DIAGNOSIS — Z794 Long term (current) use of insulin: Secondary | ICD-10-CM | POA: Diagnosis not present

## 2020-03-20 DIAGNOSIS — Z87891 Personal history of nicotine dependence: Secondary | ICD-10-CM | POA: Insufficient documentation

## 2020-03-20 DIAGNOSIS — J069 Acute upper respiratory infection, unspecified: Secondary | ICD-10-CM | POA: Insufficient documentation

## 2020-03-20 DIAGNOSIS — I132 Hypertensive heart and chronic kidney disease with heart failure and with stage 5 chronic kidney disease, or end stage renal disease: Secondary | ICD-10-CM | POA: Insufficient documentation

## 2020-03-20 DIAGNOSIS — Z951 Presence of aortocoronary bypass graft: Secondary | ICD-10-CM | POA: Insufficient documentation

## 2020-03-20 DIAGNOSIS — N186 End stage renal disease: Secondary | ICD-10-CM | POA: Diagnosis not present

## 2020-03-20 DIAGNOSIS — Z992 Dependence on renal dialysis: Secondary | ICD-10-CM | POA: Insufficient documentation

## 2020-03-20 DIAGNOSIS — E039 Hypothyroidism, unspecified: Secondary | ICD-10-CM | POA: Diagnosis not present

## 2020-03-20 DIAGNOSIS — E860 Dehydration: Secondary | ICD-10-CM | POA: Insufficient documentation

## 2020-03-20 LAB — BASIC METABOLIC PANEL
Anion gap: 14 (ref 5–15)
BUN: 20 mg/dL (ref 8–23)
CO2: 26 mmol/L (ref 22–32)
Calcium: 8.3 mg/dL — ABNORMAL LOW (ref 8.9–10.3)
Chloride: 95 mmol/L — ABNORMAL LOW (ref 98–111)
Creatinine, Ser: 3.78 mg/dL — ABNORMAL HIGH (ref 0.44–1.00)
GFR, Estimated: 12 mL/min — ABNORMAL LOW (ref >60)
Glucose, Bld: 119 mg/dL — ABNORMAL HIGH (ref 70–99)
Potassium: 3.4 mmol/L — ABNORMAL LOW (ref 3.5–5.1)
Sodium: 135 mmol/L (ref 135–145)

## 2020-03-20 LAB — CBC
HCT: 33.9 % — ABNORMAL LOW (ref 36.0–46.0)
Hemoglobin: 10.3 g/dL — ABNORMAL LOW (ref 12.0–15.0)
MCH: 29.8 pg (ref 26.0–34.0)
MCHC: 30.4 g/dL (ref 30.0–36.0)
MCV: 98 fL (ref 80.0–100.0)
Platelets: 188 10*3/uL (ref 150–400)
RBC: 3.46 MIL/uL — ABNORMAL LOW (ref 3.87–5.11)
RDW: 14.9 % (ref 11.5–15.5)
WBC: 12.3 10*3/uL — ABNORMAL HIGH (ref 4.0–10.5)
nRBC: 0 % (ref 0.0–0.2)

## 2020-03-20 NOTE — ED Triage Notes (Signed)
Pt comes into the ED via EMS from home with with a-fib SOB, having SOB since leaving dialysis, 2L [place] normally.. states she has a low grade temp 100.5, 101HR,

## 2020-03-20 NOTE — ED Triage Notes (Signed)
Pt comes via EMS from home with c/o SOB since yesterday and jsut not feeling well. Pt states she wears 3L [place] at home. Pt is diaylsis. Pt states M,W, F treatments. Pt has not missed any treatment.

## 2020-03-21 ENCOUNTER — Emergency Department
Admission: EM | Admit: 2020-03-21 | Discharge: 2020-03-21 | Disposition: A | Discharge status: Home / Self Care | Payer: Medicare Other | Attending: Emergency Medicine | Admitting: Emergency Medicine

## 2020-03-21 DIAGNOSIS — E860 Dehydration: Secondary | ICD-10-CM

## 2020-03-21 DIAGNOSIS — J069 Acute upper respiratory infection, unspecified: Secondary | ICD-10-CM

## 2020-03-21 DIAGNOSIS — N186 End stage renal disease: Secondary | ICD-10-CM

## 2020-03-21 LAB — POC SARS CORONAVIRUS 2 AG -  ED: SARS Coronavirus 2 Ag: NEGATIVE

## 2020-03-21 LAB — TROPONIN I (HIGH SENSITIVITY): Troponin I (High Sensitivity): 82 ng/L — ABNORMAL HIGH (ref <18)

## 2020-03-21 MED ORDER — SODIUM CHLORIDE 0.9 % IV BOLUS
500.0000 mL | Freq: Once | INTRAVENOUS | Status: AC
  Administered 2020-03-21: 500 mL via INTRAVENOUS

## 2020-03-21 MED ORDER — ONDANSETRON 4 MG PO TBDP: 4.0000 mg | ORAL_TABLET | Freq: Three times a day (TID) | ORAL | 0 refills | Status: AC | PRN

## 2020-03-21 NOTE — ED Provider Notes (Signed)
[facility] Emergency Department Provider Note  ____________________________________________  Time seen: Approximately 4:42 AM  I have reviewed the triage vital signs and the nursing notes.   HISTORY  Chief Complaint Shortness of Breath    HPI [name] is a 70 y.o. female with a history of CHF CKD diabetes ESRD on hemodialysis who comes the ED complaining of shortness of breath, generalized weakness and dizziness with standing since having dialysis yesterday.  She notes that over the last several days she has had nausea and poor oral intake, feels dehydrated.  At dialysis, they remove the same amount of ultrafiltrate fluid as usual.   Symptoms have been constant since yesterday, no aggravating or alleviating factors.  No vomiting, no diarrhea.  No cough.  She has been compliant with dialysis and medications.  Patient reports fatigue and loss of appetite.   Past Medical History:  Diagnosis Date  . Anemia   . Anxiety   . Aortic valve stenosis   . Arrhythmia    atrial fibrillation  . Arthritis    feet, legs  . B12 deficiency   . Bowel obstruction ([place])   . CHF (congestive heart failure) ([place])   . CKD (chronic kidney disease)    protein in urine  . Colostomy in place [facility])   . Diabetes mellitus without complication ([place])   . Dialysis patient [facility])    T/Th/Sa  . Diverticulitis large intestine   . Dysrhythmia   . Ectopic atrial tachycardia ([place])   . FSGS (focal segmental glomerulosclerosis)   . Gastritis   . GI bleed   . Heart murmur   . Hyperlipidemia   . Hypertension   . Hypothyroidism   . Hypothyroidism    unspecified  . MRSA (methicillin resistant Staphylococcus aureus)    at abdominal wound.  Jan 2017.  Treated.   . Neuropathy   . Neuropathy in diabetes ([place])   . Obesity   . Pedal edema   . Psoriasis   . Shortness of breath dyspnea   . Sleep apnea    CPAP  . Vertigo      Patient Active Problem List   Diagnosis Date Noted   . Acute hypoxemic respiratory failure ([place]) 01/23/2020  . Complication from renal dialysis device 10/12/2019  . Acute on chronic diastolic CHF (congestive heart failure) ([place]) 09/07/2019  . Pharmacologic therapy 06/30/2019  . Chronic respiratory failure with hypoxia ([place]) 06/22/2019  . Hypoxia 06/10/2019  . Spondylosis without myelopathy or radiculopathy, lumbosacral region 11/05/2018  . DDD (degenerative disc disease), lumbosacral 11/05/2018  . Hematochezia 09/03/2018  . Hemorrhoids, internal 09/03/2018  . Proctitis 09/03/2018  . OSA (obstructive sleep apnea) 08/24/2018  . Acute respiratory failure with hypoxia ([place]) 07/12/2018  . DM type 2 with diabetic peripheral neuropathy ([place]) 11/25/2017  . CKD (chronic kidney disease) stage 5, GFR less than 15 ml/min (HCC) 10/05/2017  . 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 Acute kidney injury superimposed on chronic kidney disease ([place]) 09/01/2017  . Chronic systolic CHF (congestive heart failure) ([place]) 08/19/2017  . Paroxysmal A-fib ([place]) 08/14/2017  . Cervical myelopathy ([place]) 08/07/2017  . Onychomycosis of multiple toenails with type 2 diabetes mellitus and peripheral neuropathy ([place]) 06/23/2017  . Myofascial pain 05/27/2017  . Chronic pain of left upper extremity 05/27/2017  . Morbid obesity ([place]) 05/06/2017  . Uncontrolled type 2 diabetes mellitus with hyperglycemia, with long-term current use of insulin ([place]) 02/07/2017  . Hyperlipidemia, mixed 08/08/2016  . Hx of adenomatous colonic  polyps 06/14/2016  . 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 Chronic pain syndrome 03/11/2016  . Obesity, Class II, BMI 35-39.9 03/11/2016  . Osteopenia of multiple sites 01/26/2016  . Heart burn 12/18/2015  . Polyneuropathy 11/16/2015  . Lumbar spondylosis 09/25/2015  . Lumbar facet syndrome (Bilateral) (L>R) 09/25/2015  . Recurrent major depressive disorder, in full remission ([place]) 09/19/2015  . Diabetic  neuropathy ([place]) 09/06/2015  . H/O colostomy 07/12/2015  . Long term current use of opiate analgesic 07/03/2015  . Long term prescription opiate use 07/03/2015  . Opiate use (22.5 MME/Day) 07/03/2015  . Encounter for therapeutic drug level monitoring 07/03/2015  . Encounter for pain management planning 07/03/2015  . Neurogenic pain 07/03/2015  . Chronic musculoskeletal pain 07/03/2015  . Visceral abdominal pain 07/03/2015  . Diabetic peripheral neuropathy ([place]) 07/03/2015  . Chronic low back pain (Primary Area of Pain) (Bilateral) (L>R) 07/03/2015  . Chronic lower extremity pain (Secondary area of Pain) (Bilateral) (R>L) 07/03/2015  . Chronic hip pain (Bilateral) (L>R) 07/03/2015  . Chronic neck pain (Bilateral) (L>R) 07/03/2015  . Anemia 02/16/2015  . Arthritis 02/16/2015  . End stage renal disease ([place]) 02/16/2015  . Focal and segmental hyalinosis 02/16/2015  . Bleeding gastrointestinal 02/16/2015  . Acquired atrophy of thyroid 02/16/2015  . Psoriasis 02/16/2015  . Small bowel obstruction ([place]) 02/04/2015  . Severe aortic stenosis 10/11/2014  . Paroxysmal supraventricular tachycardia ([place]) 10/11/2014  . SOBOE (shortness of breath on exertion) 10/11/2014  . Benign essential HTN 10/04/2014  . Obesity 09/05/2014  . B12 deficiency 03/02/2014  . Diabetes mellitus type 2 in obese ([place]) 12/01/2013  . Abnormal presence of protein in urine 10/15/2010     Past Surgical History:  Procedure Laterality Date  . A/V SHUNT INTERVENTION N/A 01/25/2020   Procedure: A/V SHUNT INTERVENTION;  Surgeon: Katha Cabal, MD;  Location: [place] [facility];  Service: Cardiovascular;  Laterality: N/A;  . ABDOMINAL HYSTERECTOMY    . APPLICATION OF WOUND VAC N/A 02/24/2015   Procedure: APPLICATION OF WOUND VAC;  Surgeon: Clayburn Pert, MD;  Location: [facility];  Service: General;  Laterality: N/A;  . APPLICATION OF WOUND VAC N/A 02/26/2015   Procedure: APPLICATION OF WOUND VAC;  Surgeon: Clayburn Pert, MD;  Location: [facility];  Service: General;  Laterality: N/A;  . AV FISTULA PLACEMENT Left 01/29/2019   Procedure: ARTERIOVENOUS (AV) FISTULA CREATION ( BRACHIAL CEPHALIC);  Surgeon: Katha Cabal, MD;  Location: [facility];  Service: Vascular;  Laterality: Left;  . BACK SURGERY     spur frmoved from lower back  . CARDIAC CATHETERIZATION    . CARDIAC VALVE REPLACEMENT  08/2017   [facility]  . CARDIOVERSION N/A 07/15/2018   Procedure: CARDIOVERSION;  Surgeon: Corey Skains, MD;  Location: [facility];  Service: Cardiovascular;  Laterality: N/A;  . CATARACT EXTRACTION W/PHACO Right 11/16/2018   Procedure: CATARACT EXTRACTION PHACO AND INTRAOCULAR LENS PLACEMENT (IOC) RIGHT DIABETiC  01:18.3  13.5%  10.82;  Surgeon: Eulogio Bear, MD;  Location: [place];  Service: Ophthalmology;  Laterality: Right;  Diabetic - insulin sleep apnea  . CATARACT EXTRACTION W/PHACO Left 12/07/2018   Procedure: CATARACT EXTRACTION PHACO AND INTRAOCULAR LENS PLACEMENT (IOC) LEFT DIABETIC 01:02.6  8.8%  5.77;  Surgeon: Eulogio Bear, MD;  Location[place];  Service: Ophthalmology;  Laterality: Left;  Diabetic - insulin  . CHOLECYSTECTOMY    . COLECTOMY    . COLONOSCOPY WITH PROPOFOL N/A 09/04/2016   Procedure: COLONOSCOPY WITH PROPOFOL;  Surgeon: Manya Silvas, MD;  Location: [facility];  Service: Endoscopy;  Laterality: N/A;  . COLOSTOMY REVERSAL N/A 07/12/2015   Procedure: COLOSTOMY REVERSAL;  Surgeon: Clayburn Pert, MD;  Location: [facility];  Service: General;  Laterality: N/A;  . DEBRIDEMENT OF ABDOMINAL WALL ABSCESS N/A 02/22/2015   Procedure: DEBRIDEMENT OF ABDOMINAL WALL ABSCESS;  Surgeon: Clayburn Pert, MD;  Location: [facility];  Service: General;  Laterality: N/A;  . DIALYSIS/PERMA CATHETER INSERTION N/A 10/20/2018   Procedure: DIALYSIS/PERMA CATHETER INSERTION;  Surgeon: Katha Cabal, MD;  Location: [place] [facility];  Service: Cardiovascular;  Laterality:  N/A;  . DIALYSIS/PERMA CATHETER REMOVAL N/A 08/03/2019   Procedure: DIALYSIS/PERMA CATHETER REMOVAL;  Surgeon: Katha Cabal, MD;  Location: [place] [facility];  Service: Cardiovascular;  Laterality: N/A;  . EXCISION MASS ABDOMINAL N/A 02/24/2015   Procedure: EXCISION MASS ABDOMINAL  / [place];  Surgeon: Clayburn Pert, MD;  Location: [facility];  Service: General;  Laterality: N/A;  . EXCISION MASS ABDOMINAL N/A 02/26/2015   Procedure: EXCISION MASS ABDOMINAL/wash out;  Surgeon: Clayburn Pert, MD;  Location: [facility];  Service: General;  Laterality: N/A;  . FLEXIBLE SIGMOIDOSCOPY  06/19/2015   Procedure: FLEXIBLE SIGMOIDOSCOPY;  Surgeon: Lucilla Lame, MD;  Location: [place];  Service: Endoscopy;;  UNABLE TO ACCESS OSTOMY SITE FOR ACCESS INTO COLON  . HERNIA REPAIR     umbilical  . LAPAROSCOPY  07/12/2015   Procedure: LAPAROSCOPY DIAGNOSTIC;  Surgeon: Clayburn Pert, MD;  Location: [facility];  Service: General;;  . LAPAROTOMY  07/12/2015   Procedure: EXPLORATORY LAPAROTOMY;  Surgeon: Clayburn Pert, MD;  Location: [facility];  Service: General;;  . LAPAROTOMY N/A 02/06/2015   Procedure: Laparotomy, reduction of incarcerated parastomal hernia, repair of parastomal hernia with mesh;  Surgeon: Sherri Rad, MD;  Location: [facility];  Service: General;  Laterality: N/A;  . laparotomy closure of cecal perforation  05/09/2013   Dr. Marina Gravel  . LYSIS OF ADHESION  07/12/2015   Procedure: LYSIS OF ADHESION;  Surgeon: Clayburn Pert, MD;  Location: [facility];  Service: General;;  . TONSILLECTOMY    . WOUND DEBRIDEMENT N/A 03/03/2015   Procedure: DEBRIDEMENT ABDOMINAL WOUND;  Surgeon: Florene Glen, MD;  Location: [facility];  Service: General;  Laterality: N/A;  . WOUND DEBRIDEMENT N/A 03/09/2015   Procedure: DEBRIDEMENT ABDOMINAL WOUND;  Surgeon: Florene Glen, MD;  Location: [facility];  Service: General;  Laterality: N/A;     Prior to Admission medications   Medication Sig Start Date End Date  Taking? Authorizing Provider  amiodarone (PACERONE) 200 MG tablet Take 1 tablet (200 mg total) by mouth daily. 02/01/20   Wouk, Ailene Rud, MD  atorvastatin (LIPITOR) 40 MG tablet Take 40 mg by mouth daily.     [provider]  calcium acetate (PHOSLO) 667 MG capsule Take 1,334 mg by mouth 2 (two) times daily. 01/18/20   [provider]  DULoxetine (CYMBALTA) 30 MG capsule Take 30 mg by mouth daily.  01/18/15   [provider]  DUPIXENT 300 MG/2ML SOSY Inject 300 mg into the skin every 14 (fourteen) days. 05/28/18   [provider]  HUMALOG KWIKPEN 100 UNIT/ML KwikPen Inject 0.22-0.38 mLs (22-38 Units total) into the skin See admin instructions. Inject 22u under the skin before breakfast and inject 38u under the skin before supper Patient taking differently: Inject 5 Units into the skin See admin instructions. Inject 5u under the skin three times daily before meals as needed based upon  blood glucose reading 09/10/19   Jennye Boroughs, MD  insulin glargine (LANTUS) 100 unit/mL SOPN Inject 5 Units into the skin at bedtime as needed (based upon blood glucose reading).     [provider]  levothyroxine (SYNTHROID, LEVOTHROID) 88 MCG tablet Take 88 mcg by mouth daily before breakfast.  09/11/15   [provider]  midodrine (PROAMATINE) 10 MG tablet Take 10 mg by mouth See admin instructions. Take 1 tablet (10mg ) twice daily before and after dialysis on Monday, Wednesday and Friday 12/02/19   [provider]  oxyCODONE (OXY IR/ROXICODONE) 5 MG immediate release tablet Take 1 tablet (5 mg total) by mouth every 8 (eight) hours as needed for severe pain. Must last 30 days 02/03/20 03/04/20  Gillis Santa, MD  oxyCODONE (OXY IR/ROXICODONE) 5 MG immediate release tablet Take 1 tablet (5 mg total) by mouth every 8 (eight) hours as needed for severe pain. Must last 30 days 03/04/20 04/03/20  Gillis Santa, MD  oxyCODONE (OXY IR/ROXICODONE) 5 MG immediate  release tablet Take 1 tablet (5 mg total) by mouth every 8 (eight) hours as needed for severe pain. Must last 30 days 04/03/20 05/03/20  Gillis Santa, MD  polyethylene glycol (MIRALAX / GLYCOLAX) 17 g packet Take 17 g by mouth daily as needed for moderate constipation.    [provider]  torsemide (DEMADEX) 20 MG tablet Take 40 mg by mouth daily.     [provider]  Vitamin D, Ergocalciferol, (DRISDOL) 1.25 MG (50000 UT) CAPS capsule Take 50,000 Units by mouth every Tuesday.    [provider]  warfarin (COUMADIN) 2 MG tablet Take 1 tablet (2 mg total) by mouth daily. 02/01/20 01/31/21  Wouk, Ailene Rud, MD     Allergies Buspirone, Diltiazem hcl, Gabapentin, Hydralazine, Lisinopril, Lovastatin, Metformin, Pravastatin, Sitagliptin, and Tramadol   Family History  Problem Relation Age of Onset  . Diabetes Mother   . Hypertension Father   . CAD Father   . Heart attack Father   . Colon cancer Brother     Social History Social History   Tobacco Use  . Smoking status: Former Smoker    Types: Cigarettes    Quit date: 04/18/2013    Years since quitting: 6.9  . Smokeless tobacco: Never Used  Vaping Use  . Vaping Use: Never used  Substance Use Topics  . Alcohol use: No    Alcohol/week: 0.0 standard drinks  . Drug use: No    Review of Systems  Constitutional: Positive fever and chills.  ENT:   No sore throat. No rhinorrhea. Cardiovascular:   No chest pain or syncope. Respiratory:   Positive shortness of breath without cough. Gastrointestinal:   Negative for abdominal pain, vomiting and diarrhea.  Musculoskeletal:   Negative for focal pain or swelling All other systems reviewed and are negative except as documented above in ROS and HPI.  ____________________________________________   PHYSICAL EXAM:  VITAL SIGNS: ED Triage Vitals  Enc Vitals Group     BP 03/20/20 1724 (!) 86/59     Pulse Rate 03/20/20 1724 (!) 103     Resp 03/20/20 1724 20      Temp 03/20/20 1724 98.6 F (37 C)     Temp Source 03/20/20 2144 Oral     SpO2 03/20/20 1724 99 %     Weight --      Height --      Head Circumference --      Peak Flow --  Pain Score 03/20/20 1722 4     Pain Loc --      Pain Edu? --      Excl. in [place]? --     Vital signs reviewed, nursing assessments reviewed.   Constitutional:   Alert and oriented. Non-toxic appearance. Eyes:   Conjunctivae are normal. EOMI. PERRL. ENT      Head:   Normocephalic and atraumatic.      Nose:   Wearing a mask.      Mouth/Throat:   Wearing a mask.      Neck:   No meningismus. Full ROM. Hematological/Lymphatic/Immunilogical:   No cervical lymphadenopathy. Cardiovascular:   Tachycardia heart rate 100. Symmetric bilateral radial and DP pulses.  No murmurs. Cap refill less than 2 seconds. Respiratory:   Normal respiratory effort without tachypnea/retractions. Breath sounds are clear and equal bilaterally. No wheezes/rales/rhonchi. Gastrointestinal:   Soft and nontender. Non distended. There is no CVA tenderness.  No rebound, rigidity, or guarding.  Musculoskeletal:   Normal range of motion in all extremities. No joint effusions.  No lower extremity tenderness.  Trace bilateral lower extremity edema.  Symmetric calf circumference Neurologic:   Normal speech and language.  Motor grossly intact. No acute focal neurologic deficits are appreciated.  Skin:    Skin is warm, dry and intact. No rash noted.  No petechiae, purpura, or bullae.  ____________________________________________    LABS (pertinent positives/negatives) (all labs ordered are listed, but only abnormal results are displayed) Labs Reviewed  CBC - Abnormal; Notable for the following components:      Result Value   WBC 12.3 (*)    RBC 3.46 (*)    Hemoglobin 10.3 (*)    HCT 33.9 (*)    All other components within normal limits  BASIC METABOLIC PANEL - Abnormal; Notable for the following components:   Potassium 3.4 (*)    Chloride  95 (*)    Glucose, Bld 119 (*)    Creatinine, Ser 3.78 (*)    Calcium 8.3 (*)    GFR, Estimated 12 (*)    All other components within normal limits  TROPONIN I (HIGH SENSITIVITY) - Abnormal; Notable for the following components:   Troponin I (High Sensitivity) 82 (*)    All other components within normal limits  POC SARS CORONAVIRUS 2 AG -  ED   ____________________________________________   EKG  Interpreted by me Atrial fibrillation, rate of 100.  Right axis.  Poor R wave progression.  Left bundle branch block.  No acute ischemic changes.  ____________________________________________    RADIOLOGY  DG Chest 2 View  Result Date: 03/20/2020 CLINICAL DATA:  Shortness of breath. EXAM: CHEST - 2 VIEW COMPARISON:  January 30, 2020. FINDINGS: Stable cardiomegaly. Status post transcatheter aortic valve repair. No pneumothorax or pleural effusion is noted. Minimal bibasilar subsegmental atelectasis is noted. The visualized skeletal structures are unremarkable. IMPRESSION: Minimal bibasilar subsegmental atelectasis. Electronically Signed   By: Marijo Conception M.D.   On: 03/20/2020 18:10    ____________________________________________   PROCEDURES Procedures  ____________________________________________  DIFFERENTIAL DIAGNOSIS   Pneumonia, pulmonary edema, pleural effusion, viral illness/COVID, dehydration  CLINICAL IMPRESSION / ASSESSMENT AND PLAN / ED COURSE  Medications ordered in the ED: Medications  sodium chloride 0.9 % bolus 500 mL (500 mLs Intravenous New Bag/Given 03/21/20 0257)    Pertinent labs & imaging results that were available during my care of the patient were reviewed by me and considered in my medical decision making (see chart for details).  JALESSA PEYSER was evaluated in Emergency Department on 03/21/2020 for the symptoms described in the history of present illness. She was evaluated in the context of the global COVID-19 pandemic, which necessitated  consideration that the patient might be at risk for infection with the SARS-CoV-2 virus that causes COVID-19. Institutional protocols and algorithms that pertain to the evaluation of patients at risk for COVID-19 are in a state of rapid change based on information released by regulatory bodies including the CDC and federal and state organizations. These policies and algorithms were followed during the patient's care in the ED.   Patient presents with constitutional symptoms since dialysis yesterday.  She has recently had decreased oral intake and was likely dehydrated prior to dialysis with ultrafiltration resulting in relative volume depletion.  Most suspicious for viral illness.  Doubt sepsis.  Lab panel shows baseline CKD, chronic troponin elevation at baseline.  CBC unremarkable.  Covid rapid antigen negative.  Chest x-ray unremarkable.  After small fluid bolus, tachycardia improved, blood pressure improved.  Stable for discharge home for outpatient follow-up.  Clinical Course as of 03/21/20 0558  Tue Mar 21, 2020  0557 Pt notes that her blood pressure is always on the low side, usually about 85/55.  Current BP is stable with her baseline. Orthostatics negative.  [PS]    Clinical Course User Index [PS] Carrie Mew, MD     ____________________________________________   FINAL CLINICAL IMPRESSION(S) / ED DIAGNOSES    Final diagnoses:  ESRD on hemodialysis ([place])  Dehydration  Upper respiratory tract infection, unspecified type     ED Discharge Orders    None      Portions of this note were generated with dragon dictation software. Dictation errors may occur despite best attempts at proofreading.   Carrie Mew, MD 03/21/20 [phone]

## 2020-04-05 ENCOUNTER — Other Ambulatory Visit (INDEPENDENT_AMBULATORY_CARE_PROVIDER_SITE_OTHER): Payer: Self-pay | Admitting: Vascular Surgery

## 2020-04-05 DIAGNOSIS — T829XXS Unspecified complication of cardiac and vascular prosthetic device, implant and graft, sequela: Secondary | ICD-10-CM

## 2020-04-05 DIAGNOSIS — N186 End stage renal disease: Secondary | ICD-10-CM

## 2020-04-05 DIAGNOSIS — Z9862 Peripheral vascular angioplasty status: Secondary | ICD-10-CM

## 2020-04-06 ENCOUNTER — Encounter (INDEPENDENT_AMBULATORY_CARE_PROVIDER_SITE_OTHER): Payer: Medicare Other

## 2020-04-06 ENCOUNTER — Ambulatory Visit (INDEPENDENT_AMBULATORY_CARE_PROVIDER_SITE_OTHER): Payer: Medicare Other | Admitting: Vascular Surgery

## 2020-04-26 ENCOUNTER — Other Ambulatory Visit: Payer: Self-pay

## 2020-04-26 ENCOUNTER — Emergency Department: Payer: Medicare Other

## 2020-04-26 ENCOUNTER — Emergency Department
Admission: EM | Admit: 2020-04-26 | Discharge: 2020-04-26 | Disposition: A | Discharge status: Home / Self Care | Payer: Medicare Other | Attending: Emergency Medicine | Admitting: Emergency Medicine

## 2020-04-26 DIAGNOSIS — N186 End stage renal disease: Secondary | ICD-10-CM | POA: Diagnosis not present

## 2020-04-26 DIAGNOSIS — Z87891 Personal history of nicotine dependence: Secondary | ICD-10-CM | POA: Diagnosis not present

## 2020-04-26 DIAGNOSIS — R531 Weakness: Secondary | ICD-10-CM | POA: Diagnosis not present

## 2020-04-26 DIAGNOSIS — I132 Hypertensive heart and chronic kidney disease with heart failure and with stage 5 chronic kidney disease, or end stage renal disease: Secondary | ICD-10-CM | POA: Diagnosis not present

## 2020-04-26 DIAGNOSIS — Z79899 Other long term (current) drug therapy: Secondary | ICD-10-CM | POA: Insufficient documentation

## 2020-04-26 DIAGNOSIS — E1122 Type 2 diabetes mellitus with diabetic chronic kidney disease: Secondary | ICD-10-CM | POA: Insufficient documentation

## 2020-04-26 DIAGNOSIS — E039 Hypothyroidism, unspecified: Secondary | ICD-10-CM | POA: Diagnosis not present

## 2020-04-26 DIAGNOSIS — E1140 Type 2 diabetes mellitus with diabetic neuropathy, unspecified: Secondary | ICD-10-CM | POA: Diagnosis not present

## 2020-04-26 DIAGNOSIS — Z7901 Long term (current) use of anticoagulants: Secondary | ICD-10-CM | POA: Insufficient documentation

## 2020-04-26 DIAGNOSIS — S91119A Laceration without foreign body of unspecified toe without damage to nail, initial encounter: Secondary | ICD-10-CM

## 2020-04-26 DIAGNOSIS — S99922A Unspecified injury of left foot, initial encounter: Secondary | ICD-10-CM | POA: Diagnosis present

## 2020-04-26 DIAGNOSIS — Z794 Long term (current) use of insulin: Secondary | ICD-10-CM | POA: Diagnosis not present

## 2020-04-26 DIAGNOSIS — Z992 Dependence on renal dialysis: Secondary | ICD-10-CM | POA: Diagnosis not present

## 2020-04-26 DIAGNOSIS — I5043 Acute on chronic combined systolic (congestive) and diastolic (congestive) heart failure: Secondary | ICD-10-CM | POA: Insufficient documentation

## 2020-04-26 DIAGNOSIS — W19XXXA Unspecified fall, initial encounter: Secondary | ICD-10-CM

## 2020-04-26 DIAGNOSIS — S91115A Laceration without foreign body of left lesser toe(s) without damage to nail, initial encounter: Secondary | ICD-10-CM | POA: Diagnosis not present

## 2020-04-26 DIAGNOSIS — W1811XA Fall from or off toilet without subsequent striking against object, initial encounter: Secondary | ICD-10-CM | POA: Insufficient documentation

## 2020-04-26 LAB — BASIC METABOLIC PANEL
Anion gap: 11 (ref 5–15)
BUN: 46 mg/dL — ABNORMAL HIGH (ref 8–23)
CO2: 28 mmol/L (ref 22–32)
Calcium: 8.7 mg/dL — ABNORMAL LOW (ref 8.9–10.3)
Chloride: 92 mmol/L — ABNORMAL LOW (ref 98–111)
Creatinine, Ser: 6.39 mg/dL — ABNORMAL HIGH (ref 0.44–1.00)
GFR, Estimated: 7 mL/min — ABNORMAL LOW (ref >60)
Glucose, Bld: 179 mg/dL — ABNORMAL HIGH (ref 70–99)
Potassium: 4.5 mmol/L (ref 3.5–5.1)
Sodium: 131 mmol/L — ABNORMAL LOW (ref 135–145)

## 2020-04-26 LAB — CBC
HCT: 33.6 % — ABNORMAL LOW (ref 36.0–46.0)
Hemoglobin: 10 g/dL — ABNORMAL LOW (ref 12.0–15.0)
MCH: 28.3 pg (ref 26.0–34.0)
MCHC: 29.8 g/dL — ABNORMAL LOW (ref 30.0–36.0)
MCV: 95.2 fL (ref 80.0–100.0)
Platelets: 243 10*3/uL (ref 150–400)
RBC: 3.53 MIL/uL — ABNORMAL LOW (ref 3.87–5.11)
RDW: 15.2 % (ref 11.5–15.5)
WBC: 12.6 10*3/uL — ABNORMAL HIGH (ref 4.0–10.5)
nRBC: 0 % (ref 0.0–0.2)

## 2020-04-26 MED ORDER — CEPHALEXIN 500 MG PO CAPS: 500.0000 mg | ORAL_CAPSULE | Freq: Two times a day (BID) | ORAL | 0 refills | Status: AC

## 2020-04-26 NOTE — ED Triage Notes (Signed)
Pt to ED [facility] from home for fall last night while getting off toilet. States has been having weakness in legs before fall. Supposed to have dialysis today but daughter unable to get pt into car.   Ems reports pt in a fib, and LBBB Pt uses 3L [place] at home.  cbg 227  Pt states she has been having weakness for "a long time"  Pt 86% on 3L, increased to 4L

## 2020-04-26 NOTE — Discharge Instructions (Signed)
Please reschedule dialysis for tomorrow.  Because you have diabetes and because of the location of your cut, please wash with soap and water daily. We have prescribed antibiotics as well

## 2020-04-26 NOTE — ED Notes (Signed)
[name] contacted regarding pt's missed dialysis treatment this morning. [facility] staff states that pt can come to clinic for treatment at noon tomorrow. Pt and family updated on appointment.

## 2020-04-26 NOTE — ED Provider Notes (Signed)
[facility] Emergency Department Provider Note   ____________________________________________    I have reviewed the triage vital signs and the nursing notes.   HISTORY  Chief Complaint Fall     HPI LIRA STEPHEN is a 70 y.o. female with significant past medical history as detailed below who reports yesterday she had a fall while try to get off the toilet.  She reports that she has weakness in her legs bilaterally which is been ongoing for quite some time and that is the cause of her fall.  She felt weak this morning as well and was unable to make it to dialysis.  Denies chest pain, no cough, has chronic shortness of breath.  No palpitations, denies injuries from the fall.  No head injury.  She does wear home oxygen  Past Medical History:  Diagnosis Date  . Anemia   . Anxiety   . Aortic valve stenosis   . Arrhythmia    atrial fibrillation  . Arthritis    feet, legs  . B12 deficiency   . Bowel obstruction ([place])   . CHF (congestive heart failure) ([place])   . CKD (chronic kidney disease)    protein in urine  . Colostomy in place [facility])   . Diabetes mellitus without complication ([place])   . Dialysis patient [facility])    T/Th/Sa  . Diverticulitis large intestine   . Dysrhythmia   . Ectopic atrial tachycardia ([place])   . FSGS (focal segmental glomerulosclerosis)   . Gastritis   . GI bleed   . Heart murmur   . Hyperlipidemia   . Hypertension   . Hypothyroidism   . Hypothyroidism    unspecified  . MRSA (methicillin resistant Staphylococcus aureus)    at abdominal wound.  Jan 2017.  Treated.   . Neuropathy   . Neuropathy in diabetes ([place])   . Obesity   . Pedal edema   . Psoriasis   . Shortness of breath dyspnea   . Sleep apnea    CPAP  . Vertigo     Patient Active Problem List   Diagnosis Date Noted  . Acute hypoxemic respiratory failure ([place]) 01/23/2020  . Complication from renal dialysis device 10/12/2019  . Acute on chronic diastolic CHF  (congestive heart failure) ([place]) 09/07/2019  . Pharmacologic therapy 06/30/2019  . Chronic respiratory failure with hypoxia ([place]) 06/22/2019  . Hypoxia 06/10/2019  . Spondylosis without myelopathy or radiculopathy, lumbosacral region 11/05/2018  . DDD (degenerative disc disease), lumbosacral 11/05/2018  . Hematochezia 09/03/2018  . Hemorrhoids, internal 09/03/2018  . Proctitis 09/03/2018  . OSA (obstructive sleep apnea) 08/24/2018  . Acute respiratory failure with hypoxia ([place]) 07/12/2018  . DM type 2 with diabetic peripheral neuropathy ([place]) 11/25/2017  . CKD (chronic kidney disease) stage 5, GFR less than 15 ml/min (HCC) 10/05/2017  . 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 Acute kidney injury superimposed on chronic kidney disease ([place]) 09/01/2017  . Chronic systolic CHF (congestive heart failure) ([place]) 08/19/2017  . Paroxysmal A-fib ([place]) 08/14/2017  . Cervical myelopathy ([place]) 08/07/2017  . Onychomycosis of multiple toenails with type 2 diabetes mellitus and peripheral neuropathy ([place]) 06/23/2017  . Myofascial pain 05/27/2017  . Chronic pain of left upper extremity 05/27/2017  . Morbid obesity ([place]) 05/06/2017  . Uncontrolled type 2 diabetes mellitus with hyperglycemia, with long-term current use of insulin ([place]) 02/07/2017  . Hyperlipidemia, mixed 08/08/2016  . Hx of adenomatous colonic polyps 06/14/2016  . 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  Chronic pain syndrome 03/11/2016  . Obesity, Class II, BMI 35-39.9 03/11/2016  . Osteopenia of multiple sites 01/26/2016  . Heart burn 12/18/2015  . Polyneuropathy 11/16/2015  . Lumbar spondylosis 09/25/2015  . Lumbar facet syndrome (Bilateral) (L>R) 09/25/2015  . Recurrent major depressive disorder, in full remission ([place]) 09/19/2015  . Diabetic neuropathy ([place]) 09/06/2015  . H/O colostomy 07/12/2015  . Long term current use of opiate analgesic 07/03/2015  . Long term prescription opiate use  07/03/2015  . Opiate use (22.5 MME/Day) 07/03/2015  . Encounter for therapeutic drug level monitoring 07/03/2015  . Encounter for pain management planning 07/03/2015  . Neurogenic pain 07/03/2015  . Chronic musculoskeletal pain 07/03/2015  . Visceral abdominal pain 07/03/2015  . Diabetic peripheral neuropathy ([place]) 07/03/2015  . Chronic low back pain (Primary Area of Pain) (Bilateral) (L>R) 07/03/2015  . Chronic lower extremity pain (Secondary area of Pain) (Bilateral) (R>L) 07/03/2015  . Chronic hip pain (Bilateral) (L>R) 07/03/2015  . Chronic neck pain (Bilateral) (L>R) 07/03/2015  . Anemia 02/16/2015  . Arthritis 02/16/2015  . End stage renal disease ([place]) 02/16/2015  . Focal and segmental hyalinosis 02/16/2015  . Bleeding gastrointestinal 02/16/2015  . Acquired atrophy of thyroid 02/16/2015  . Psoriasis 02/16/2015  . Small bowel obstruction ([place]) 02/04/2015  . Severe aortic stenosis 10/11/2014  . Paroxysmal supraventricular tachycardia ([place]) 10/11/2014  . SOBOE (shortness of breath on exertion) 10/11/2014  . Benign essential HTN 10/04/2014  . Obesity 09/05/2014  . B12 deficiency 03/02/2014  . Diabetes mellitus type 2 in obese ([place]) 12/01/2013  . Abnormal presence of protein in urine 10/15/2010    Past Surgical History:  Procedure Laterality Date  . A/V SHUNT INTERVENTION N/A 01/25/2020   Procedure: A/V SHUNT INTERVENTION;  Surgeon: Katha Cabal, MD;  Location: [place] [facility];  Service: Cardiovascular;  Laterality: N/A;  . ABDOMINAL HYSTERECTOMY    . APPLICATION OF WOUND VAC N/A 02/24/2015   Procedure: APPLICATION OF WOUND VAC;  Surgeon: Clayburn Pert, MD;  Location: [facility];  Service: General;  Laterality: N/A;  . APPLICATION OF WOUND VAC N/A 02/26/2015   Procedure: APPLICATION OF WOUND VAC;  Surgeon: Clayburn Pert, MD;  Location: [facility];  Service: General;  Laterality: N/A;  . AV FISTULA PLACEMENT Left 01/29/2019   Procedure: ARTERIOVENOUS (AV) FISTULA  CREATION ( BRACHIAL CEPHALIC);  Surgeon: Katha Cabal, MD;  Location: [facility];  Service: Vascular;  Laterality: Left;  . BACK SURGERY     spur frmoved from lower back  . CARDIAC CATHETERIZATION    . CARDIAC VALVE REPLACEMENT  08/2017   [facility]  . CARDIOVERSION N/A 07/15/2018   Procedure: CARDIOVERSION;  Surgeon: Corey Skains, MD;  Location: [facility];  Service: Cardiovascular;  Laterality: N/A;  . CATARACT EXTRACTION W/PHACO Right 11/16/2018   Procedure: CATARACT EXTRACTION PHACO AND INTRAOCULAR LENS PLACEMENT (IOC) RIGHT DIABETiC  01:18.3  13.5%  10.82;  Surgeon: Eulogio Bear, MD;  Location: [place];  Service: Ophthalmology;  Laterality: Right;  Diabetic - insulin sleep apnea  . CATARACT EXTRACTION W/PHACO Left 12/07/2018   Procedure: CATARACT EXTRACTION PHACO AND INTRAOCULAR LENS PLACEMENT (IOC) LEFT DIABETIC 01:02.6  8.8%  5.77;  Surgeon: Eulogio Bear, MD;  Location: [place];  Service: Ophthalmology;  Laterality: Left;  Diabetic - insulin  . CHOLECYSTECTOMY    . COLECTOMY    . COLONOSCOPY WITH PROPOFOL N/A 09/04/2016   Procedure: COLONOSCOPY WITH PROPOFOL;  Surgeon: Manya Silvas, MD;  Location: [facility];  Service: Endoscopy;  Laterality: N/A;  . COLOSTOMY REVERSAL N/A 07/12/2015   Procedure: COLOSTOMY REVERSAL;  Surgeon: Clayburn Pert, MD;  Location: [facility];  Service: General;  Laterality: N/A;  . DEBRIDEMENT OF ABDOMINAL WALL ABSCESS N/A 02/22/2015   Procedure: DEBRIDEMENT OF ABDOMINAL WALL ABSCESS;  Surgeon: Clayburn Pert, MD;  Location: [facility];  Service: General;  Laterality: N/A;  . DIALYSIS/PERMA CATHETER INSERTION N/A 10/20/2018   Procedure: DIALYSIS/PERMA CATHETER INSERTION;  Surgeon: Katha Cabal, MD;  Location: [place] [facility];  Service: Cardiovascular;  Laterality: N/A;  . DIALYSIS/PERMA CATHETER REMOVAL N/A 08/03/2019   Procedure: DIALYSIS/PERMA CATHETER REMOVAL;  Surgeon: Katha Cabal, MD;  Location: [place] [facility];  Service: Cardiovascular;  Laterality: N/A;  . EXCISION MASS ABDOMINAL N/A 02/24/2015   Procedure: EXCISION MASS ABDOMINAL  / [place];  Surgeon: Clayburn Pert, MD;  Location: [facility];  Service: General;  Laterality: N/A;  . EXCISION MASS ABDOMINAL N/A 02/26/2015   Procedure: EXCISION MASS ABDOMINAL/wash out;  Surgeon: Clayburn Pert, MD;  Location: [facility];  Service: General;  Laterality: N/A;  . FLEXIBLE SIGMOIDOSCOPY  06/19/2015   Procedure: FLEXIBLE SIGMOIDOSCOPY;  Surgeon: Lucilla Lame, MD;  Location: [place];  Service: Endoscopy;;  UNABLE TO ACCESS OSTOMY SITE FOR ACCESS INTO COLON  . HERNIA REPAIR     umbilical  . LAPAROSCOPY  07/12/2015   Procedure: LAPAROSCOPY DIAGNOSTIC;  Surgeon: Clayburn Pert, MD;  Location: [facility];  Service: General;;  . LAPAROTOMY  07/12/2015   Procedure: EXPLORATORY LAPAROTOMY;  Surgeon: Clayburn Pert, MD;  Location: [facility];  Service: General;;  . LAPAROTOMY N/A 02/06/2015   Procedure: Laparotomy, reduction of incarcerated parastomal hernia, repair of parastomal hernia with mesh;  Surgeon: Sherri Rad, MD;  Location: [facility];  Service: General;  Laterality: N/A;  . laparotomy closure of cecal perforation  05/09/2013   Dr. Marina Gravel  . LYSIS OF ADHESION  07/12/2015   Procedure: LYSIS OF ADHESION;  Surgeon: Clayburn Pert, MD;  Location: [facility];  Service: General;;  . TONSILLECTOMY    . WOUND DEBRIDEMENT N/A 03/03/2015   Procedure: DEBRIDEMENT ABDOMINAL WOUND;  Surgeon: Florene Glen, MD;  Location: [facility];  Service: General;  Laterality: N/A;  . WOUND DEBRIDEMENT N/A 03/09/2015   Procedure: DEBRIDEMENT ABDOMINAL WOUND;  Surgeon: Florene Glen, MD;  Location: [facility];  Service: General;  Laterality: N/A;    Prior to Admission medications   Medication Sig Start Date End Date Taking? Authorizing Provider  cephALEXin (KEFLEX) 500 MG capsule Take 1 capsule (500 mg total) by mouth 2 (two) times daily for 5 days. 04/26/20 05/01/20  Yes Lavonia Drafts, MD  amiodarone (PACERONE) 200 MG tablet Take 1 tablet (200 mg total) by mouth daily. 02/01/20   Wouk, Ailene Rud, MD  atorvastatin (LIPITOR) 40 MG tablet Take 40 mg by mouth daily.     [provider]  calcium acetate (PHOSLO) 667 MG capsule Take 1,334 mg by mouth 2 (two) times daily. 01/18/20   [provider]  DULoxetine (CYMBALTA) 30 MG capsule Take 30 mg by mouth daily.  01/18/15   [provider]  DUPIXENT 300 MG/2ML SOSY Inject 300 mg into the skin every 14 (fourteen) days. 05/28/18   [provider]  HUMALOG KWIKPEN 100 UNIT/ML KwikPen Inject 0.22-0.38 mLs (22-38 Units total) into the skin See admin instructions. Inject 22u under the skin before breakfast and inject 38u under the skin before supper Patient taking differently: Inject 5 Units into the skin See admin instructions. Inject  5u under the skin three times daily before meals as needed based upon blood glucose reading 09/10/19   Jennye Boroughs, MD  insulin glargine (LANTUS) 100 unit/mL SOPN Inject 5 Units into the skin at bedtime as needed (based upon blood glucose reading).     [provider]  levothyroxine (SYNTHROID, LEVOTHROID) 88 MCG tablet Take 88 mcg by mouth daily before breakfast.  09/11/15   [provider]  midodrine (PROAMATINE) 10 MG tablet Take 10 mg by mouth See admin instructions. Take 1 tablet (10mg ) twice daily before and after dialysis on Monday, Wednesday and Friday 12/02/19   [provider]  ondansetron (ZOFRAN ODT) 4 MG disintegrating tablet Take 1 tablet (4 mg total) by mouth every 8 (eight) hours as needed for nausea or vomiting. 03/21/20   Carrie Mew, MD  oxyCODONE (OXY IR/ROXICODONE) 5 MG immediate release tablet Take 1 tablet (5 mg total) by mouth every 8 (eight) hours as needed for severe pain. Must last 30 days 02/03/20 03/04/20  Gillis Santa, MD  oxyCODONE (OXY IR/ROXICODONE) 5 MG immediate release tablet Take 1 tablet (5  mg total) by mouth every 8 (eight) hours as needed for severe pain. Must last 30 days 03/04/20 04/03/20  Gillis Santa, MD  oxyCODONE (OXY IR/ROXICODONE) 5 MG immediate release tablet Take 1 tablet (5 mg total) by mouth every 8 (eight) hours as needed for severe pain. Must last 30 days 04/03/20 05/03/20  Gillis Santa, MD  polyethylene glycol (MIRALAX / GLYCOLAX) 17 g packet Take 17 g by mouth daily as needed for moderate constipation.    [provider]  torsemide (DEMADEX) 20 MG tablet Take 40 mg by mouth daily.     [provider]  Vitamin D, Ergocalciferol, (DRISDOL) 1.25 MG (50000 UT) CAPS capsule Take 50,000 Units by mouth every Tuesday.    [provider]  warfarin (COUMADIN) 2 MG tablet Take 1 tablet (2 mg total) by mouth daily. 02/01/20 01/31/21  Wouk, Ailene Rud, MD     Allergies Buspirone, Diltiazem hcl, Gabapentin, Hydralazine, Lisinopril, Lovastatin, Metformin, Pravastatin, Sitagliptin, and Tramadol  Family History  Problem Relation Age of Onset  . Diabetes Mother   . Hypertension Father   . CAD Father   . Heart attack Father   . Colon cancer Brother     Social History Social History   Tobacco Use  . Smoking status: Former Smoker    Types: Cigarettes    Quit date: 04/18/2013    Years since quitting: 7.0  . Smokeless tobacco: Never Used  Vaping Use  . Vaping Use: Never used  Substance Use Topics  . Alcohol use: No    Alcohol/week: 0.0 standard drinks  . Drug use: No    Review of Systems  Constitutional: No fever/chills Eyes: No visual changes.  ENT: No injury Cardiovascular: Denies chest pain. Respiratory: Chronic shortness of breath Gastrointestinal: No abdominal pain.   Genitourinary: Makes very little urine Musculoskeletal: Negative for back pain. Skin: Negative for rash. Neurological: Negative for headaches, no focal weakness   ____________________________________________   PHYSICAL EXAM:  VITAL SIGNS: ED Triage Vitals   Enc Vitals Group     BP 04/26/20 1439 116/62     Pulse Rate 04/26/20 1439 (!) 106     Resp 04/26/20 1439 (!) 24     Temp 04/26/20 1439 98.8 F (37.1 C)     Temp Source 04/26/20 1439 Oral     SpO2 04/26/20 1439 95 %     Weight 04/26/20 1436 105.4  kg (232 lb 4.8 oz)     Height 04/26/20 1436 1.6 m (5\' 3" )     Head Circumference --      Peak Flow --      Pain Score 04/26/20 1436 0     Pain Loc --      Pain Edu? --      Excl. in [place]? --     Constitutional: Alert and oriented. No acute distress.   Nose: No congestion/rhinnorhea. Mouth/Throat: Mucous membranes are moist.   Neck:  Painless ROM Cardiovascular: Normal rate, regular rhythm. Grossly normal heart sounds.  Good peripheral circulation. Respiratory: Normal respiratory effort.  No retractions. Lungs CTAB. Gastrointestinal: Soft and nontender. No distention.  No CVA tenderness.  Musculoskeletal: No lower extremity tenderness nor edema.  Warm and well perfused Neurologic:  Normal speech and language. No gross focal neurologic deficits are appreciated.  Lower extremity strength appears normal Skin:  Skin is warm, dry .  1 cm laceration to the inferior aspect of the left fourth toe, shallow laceration, no bleeding Psychiatric: Mood and affect are normal. Speech and behavior are normal.  ____________________________________________   LABS (all labs ordered are listed, but only abnormal results are displayed)  Labs Reviewed  BASIC METABOLIC PANEL - Abnormal; Notable for the following components:      Result Value   Sodium 131 (*)    Chloride 92 (*)    Glucose, Bld 179 (*)    BUN 46 (*)    Creatinine, Ser 6.39 (*)    Calcium 8.7 (*)    GFR, Estimated 7 (*)    All other components within normal limits  CBC - Abnormal; Notable for the following components:   WBC 12.6 (*)    RBC 3.53 (*)    Hemoglobin 10.0 (*)    HCT 33.6 (*)    MCHC 29.8 (*)    All other components within normal limits  URINALYSIS, COMPLETE (UACMP) WITH  MICROSCOPIC   ____________________________________________  EKG  ED ECG REPORT I, Lavonia Drafts, the attending physician, personally viewed and interpreted this ECG.  Date: 04/26/2020  Rhythm: Atrial fibrillation QRS Axis: normal Intervals: Normal ST/T Wave abnormalities: n nonspecific change Narrative Interpretation: no evidence of acute ischemia  ____________________________________________  RADIOLOGY  Chest x-ray read by me, no infiltrate or effusion ____________________________________________   PROCEDURES  Procedure(s) performed: No  Procedures   Critical Care performed: No ____________________________________________   INITIAL IMPRESSION / ASSESSMENT AND PLAN / ED COURSE  Pertinent labs & imaging results that were available during my care of the patient were reviewed by me and considered in my medical decision making (see chart for details).  Patient presents for a fall that occurred yesterday.  Suffered a small laceration to her foot which was cleaned and dressed here in the emergency department.  Her exam is overall unremarkable, she appears to be at her baseline.  Chest x-ray and labs are not significantly changed from prior.  No evidence of pulmonary edema, no hyperkalemia.  Discussed with her that she will need to reschedule dialysis for tomorrow but no indication for admission at this time.  Patient agrees with the plan, appropriate for discharge with outpatient follow-up    ____________________________________________   FINAL CLINICAL IMPRESSION(S) / ED DIAGNOSES  Final diagnoses:  Fall, initial encounter  Laceration of toe of left foot without foreign body present or damage to nail, unspecified toe, initial encounter  Generalized weakness        Note:  This document was prepared using  [employer] forces training and education officer and may include unintentional dictation errors.   Lavonia Drafts, MD 04/26/20 [phone]

## 2020-05-02 ENCOUNTER — Other Ambulatory Visit: Payer: Self-pay

## 2020-05-02 ENCOUNTER — Encounter: Payer: Self-pay | Admitting: Student in an Organized Health Care Education/Training Program

## 2020-05-02 ENCOUNTER — Ambulatory Visit
Payer: Medicare Other | Attending: Student in an Organized Health Care Education/Training Program | Admitting: Student in an Organized Health Care Education/Training Program

## 2020-05-02 VITALS — BP 99/72 | HR 78 | Temp 97.1°F | Resp 16 | Ht 63.0 in | Wt 232.0 lb

## 2020-05-02 DIAGNOSIS — G8929 Other chronic pain: Secondary | ICD-10-CM | POA: Insufficient documentation

## 2020-05-02 DIAGNOSIS — G894 Chronic pain syndrome: Secondary | ICD-10-CM | POA: Diagnosis present

## 2020-05-02 DIAGNOSIS — R109 Unspecified abdominal pain: Secondary | ICD-10-CM | POA: Insufficient documentation

## 2020-05-02 DIAGNOSIS — M47816 Spondylosis without myelopathy or radiculopathy, lumbar region: Secondary | ICD-10-CM | POA: Insufficient documentation

## 2020-05-02 DIAGNOSIS — M5442 Lumbago with sciatica, left side: Secondary | ICD-10-CM | POA: Diagnosis present

## 2020-05-02 DIAGNOSIS — M7918 Myalgia, other site: Secondary | ICD-10-CM | POA: Diagnosis present

## 2020-05-02 DIAGNOSIS — M5441 Lumbago with sciatica, right side: Secondary | ICD-10-CM | POA: Insufficient documentation

## 2020-05-02 MED ORDER — ALPHA-LIPOIC ACID 600 MG PO CAPS: 600.0000 mg | ORAL_CAPSULE | Freq: Every day | ORAL | 2 refills | Status: AC

## 2020-05-02 MED ORDER — OXYCODONE HCL 5 MG PO TABS: 5.0000 mg | ORAL_TABLET | Freq: Three times a day (TID) | ORAL | 0 refills | Status: AC | PRN

## 2020-05-02 NOTE — Patient Instructions (Signed)
Continue Oxycodone as prescribed Start Alpha lipoic acid Consider tens UNIT

## 2020-05-02 NOTE — Progress Notes (Signed)
PROVIDER NOTE: Information contained herein reflects review and annotations entered in association with encounter. Interpretation of such information and data should be left to medically-trained personnel. Information provided to patient can be located elsewhere in the medical record under "Patient Instructions". Document created using STT-dictation technology, any transcriptional errors that may result from process are unintentional.    Patient: [name]  Service Category: E/M  Provider: Gillis Santa, MD  DOB: Jun 30, 1950  DOS: 05/02/2020  Specialty: Interventional Pain Management  MRN: [id number]  Setting: Ambulatory outpatient  PCP: Adin Hector, MD  Type: Established Patient    Referring Provider: Adin Hector, MD  Location: Office  Delivery: Face-to-face     HPI  Ms. BRITTANYA WINBURN, a 70 y.o. year old female, is here today because of her Chronic pain syndrome [G89.4]. Ms. Maguire primary complain today is Back Pain  Pertinent problems: Ms. Strehl has Long term current use of opiate analgesic; Long term prescription opiate use; Opiate use (22.5 MME/Day); Neurogenic pain; Chronic musculoskeletal pain; Visceral abdominal pain; Diabetic peripheral neuropathy ([place]); Chronic low back pain (Primary Area of Pain) (Bilateral) (L>R); Chronic lower extremity pain (Secondary area of Pain) (Bilateral) (R>L); Chronic hip pain (Bilateral) (L>R); Chronic neck pain (Bilateral) (L>R); Diabetic neuropathy ([place]); Lumbar spondylosis; Lumbar facet syndrome (Bilateral) (L>R); Polyneuropathy; Chronic pain syndrome; Spondylosis without myelopathy or radiculopathy, lumbosacral region; and DDD (degenerative disc disease), lumbosacral on their pertinent problem list. Pain Assessment: Severity of Chronic pain is reported as a 8 /10. Location: Back Lower,Right,Left/radiates down back of legs to ankles. Onset: More than a month ago. Quality: Aching,Cramping,Constant,Tiring,Discomfort,Nagging,Sharp. Timing: Constant.  Modifying factor(s): medications, rest,Tylenol. Vitals:  height is _0  (1.6 m) and weight is 232 lb (105.2 kg). Her temperature is 97.1 F (36.2 C) (abnormal). Her blood pressure is 99/72 and her pulse is 78. Her respiration is 16 and oxygen saturation is 100%.   Reason for encounter: medication management.   Patient had a fall while she was try to get off the toilet on 04/26/2020 due to weakness in her legs.  She had a small laceration of her left toe.  She was given a prescription for Keflex and discharged.  She has increased acute pain as result of her fall.  Patient continues multimodal pain regimen as prescribed.  States that it provides pain relief and improvement in functional status.  Patient is accompanied today by her daughter.  Patient is requesting an increase in her oxycodone but I recommend that we explore nonopioid analgesics to help optimize her pain management especially in the context of her having a fall and weakness in her legs.  Recommend alpha lipoic acid 600 mg for diabetic neuropathy and also recommend TENS unit to be applied to low back and other painful areas.  Instructions reviewed with patient of how to apply TENS unit.  Patient continues dialysis for end-stage renal disease.  Pharmacotherapy Assessment   04/04/2020  02/02/2020   1  Oxycodone Hcl (Ir) 5 Mg Tablet  90.00  30  Bi Lat  [id number]  Nor ([id number])  0/0  22.50 MME  Medicare  [place]      Analgesic: Oxycodone 5 mg 3 times daily as needed, quantity 90/month; MME equals 22.5    Monitoring: [place] PMP: PDMP reviewed during this encounter.       Pharmacotherapy: No side-effects or adverse reactions reported. Compliance: No problems identified. Effectiveness: Clinically acceptable.  Ignatius Specking, RN  05/02/2020  2:47 PM  Sign when Signing Visit Nursing  Pain Medication Assessment:  Safety precautions to be maintained throughout the outpatient stay will include: orient to surroundings, keep bed in low position,  maintain call bell within reach at all times, provide assistance with transfer out of bed and ambulation.  Medication Inspection Compliance: Pill count conducted under aseptic conditions, in front of the patient. Neither the pills nor the bottle was removed from the patient's sight at any time. Once count was completed pills were immediately returned to the patient in their original bottle.  Medication: Oxycodone IR Pill/Patch Count: 8 of 90 pills remain Pill/Patch Appearance: Markings consistent with prescribed medication Bottle Appearance: Standard pharmacy container. Clearly labeled. Filled Date: 2 / 54 / 2022 Last Medication intake:  Day before yesterday    UDS:  Summary  Date Value Ref Range Status  07/06/2019 Note  Final    Comment:    ==================================================================== [employer] (MW) ==================================================================== Test                             Result       Flag       Units Drug Present   Oxycodone                      469                     ng/mg creat   Oxymorphone                    903                     ng/mg creat   Noroxycodone                   290                     ng/mg creat   Noroxymorphone                 203                     ng/mg creat    Sources of oxycodone are scheduled prescription medications.    Oxymorphone, noroxycodone, and noroxymorphone are expected    metabolites of oxycodone. Oxymorphone is also available as a    scheduled prescription medication. ==================================================================== Test                      Result    Flag   Units      Ref Range   Creatinine              100              mg/dL      >=20 ==================================================================== Declared Medications:  Medication list was not provided. ==================================================================== For clinical consultation,  please call [phone]. ====================================================================      ROS  Constitutional: Denies any fever or chills Gastrointestinal: No reported hemesis, hematochezia, vomiting, or acute GI distress Musculoskeletal: Low back pain Neurological: No reported episodes of acute onset apraxia, aphasia, dysarthria, agnosia, amnesia, paralysis, loss of coordination, or loss of consciousness  Medication Review  Alpha-Lipoic Acid, DULoxetine, Vitamin D (Ergocalciferol), amiodarone, atorvastatin, calcium acetate, dupilumab, insulin glargine, insulin lispro, levothyroxine, midodrine, ondansetron, oxyCODONE, polyethylene glycol, torsemide, and warfarin  History Review  Allergy: Ms. Vowles is allergic to buspirone, diltiazem hcl, gabapentin, hydralazine,  keflex [cephalexin], lisinopril, lovastatin, metformin, pravastatin, sitagliptin, and tramadol. Drug: Ms. Congleton  reports no history of drug use. Alcohol:  reports no history of alcohol use. Tobacco:  reports that she quit smoking about 7 years ago. Her smoking use included cigarettes. She has never used smokeless tobacco. Social: Ms. Sahlin  reports that she quit smoking about 7 years ago. Her smoking use included cigarettes. She has never used smokeless tobacco. She reports that she does not drink alcohol and does not use drugs. Medical:  has a past medical history of Anemia, Anxiety, Aortic valve stenosis, Arrhythmia, Arthritis, B12 deficiency, Bowel obstruction (HCC), CHF (congestive heart failure) ([place]), CKD (chronic kidney disease), Colostomy in place [facility]), Diabetes mellitus without complication ([place]), Dialysis patient [facility]), Diverticulitis large intestine, Dysrhythmia, Ectopic atrial tachycardia ([place]), FSGS (focal segmental glomerulosclerosis), Gastritis, GI bleed, Heart murmur, Hyperlipidemia, Hypertension, Hypothyroidism, Hypothyroidism, MRSA (methicillin resistant Staphylococcus aureus), Neuropathy, Neuropathy in  diabetes ([place]), Obesity, Pedal edema, Psoriasis, Shortness of breath dyspnea, Sleep apnea, and Vertigo. Surgical: Ms. Weight  has a past surgical history that includes Colectomy; laparotomy closure of cecal perforation (05/09/2013); Tonsillectomy; Back surgery; Cardiac catheterization; Abdominal hysterectomy; Debridement of abdominal wall abscess (N/A, 02/22/2015); Excision mass abdominal (N/A, 02/24/2015); Application if wound vac (N/A, 02/24/2015); Excision mass abdominal (N/A, 02/26/2015); Application if wound vac (N/A, 02/26/2015); Wound debridement (N/A, 03/03/2015); Wound debridement (N/A, 03/09/2015); Flexible sigmoidoscopy (06/19/2015); Colostomy reversal (N/A, 07/12/2015); laparotomy (07/12/2015); laparoscopy (07/12/2015); Lysis of adhesion (07/12/2015); laparotomy (N/A, 02/06/2015); Colonoscopy with propofol (N/A, 09/04/2016); Cardiac valve replacement (08/2017); Cardioversion (N/A, 07/15/2018); DIALYSIS/PERMA CATHETER INSERTION (N/A, 10/20/2018); Cataract extraction w/PHACO (Right, 11/16/2018); Cataract extraction w/PHACO (Left, 12/07/2018); Cholecystectomy; Hernia repair; AV fistula placement (Left, 01/29/2019); DIALYSIS/PERMA CATHETER REMOVAL (N/A, 08/03/2019); and A/V SHUNT INTERVENTION (N/A, 01/25/2020). Family: family history includes CAD in her father; Colon cancer in her brother; Diabetes in her mother; Heart attack in her father; Hypertension in her father.  Laboratory Chemistry Profile   Renal Lab Results  Component Value Date   BUN 46 (H) 04/26/2020   CREATININE 6.39 (H) 04/26/2020   LABCREA 26 07/18/2015   GFRAA 12 (L) 09/10/2019   GFRNONAA 7 (L) 04/26/2020     Hepatic Lab Results  Component Value Date   AST 22 01/23/2020   ALT 18 01/23/2020   ALBUMIN 3.3 (L) 01/27/2020   ALKPHOS 58 01/23/2020   LIPASE 21 02/04/2015     Electrolytes Lab Results  Component Value Date   NA 131 (L) 04/26/2020   K 4.5 04/26/2020   CL 92 (L) 04/26/2020   CALCIUM 8.7 (L) 04/26/2020   MG 2.0 06/13/2019   PHOS  6.0 (H) 01/28/2020     Bone Lab Results  Component Value Date   25OHVITD1 43 11/07/2015   25OHVITD2 38 11/07/2015   25OHVITD3 5.4 11/07/2015     Inflammation (CRP: Acute Phase) (ESR: Chronic Phase) Lab Results  Component Value Date   CRP 0.6 11/07/2015   ESRSEDRATE 16 11/07/2015   LATICACIDVEN 0.8 02/21/2015       Note: Above Lab results reviewed.  Recent Imaging Review  DG Chest Port 1 View CLINICAL DATA:  Weakness.  EXAM: PORTABLE CHEST 1 VIEW  COMPARISON:  March 20, 2020  FINDINGS: Diffusely increased interstitial lung markings are seen. Mild, stable areas of scarring and/or atelectasis are noted within the bilateral lung bases. The cardiac silhouette is markedly enlarged and unchanged in size. Radiopaque surgical clips are seen overlying the lateral aspect of the upper left hemithorax. Degenerative changes seen throughout the thoracic spine.  IMPRESSION: 1.  Stable cardiomegaly with mild, stable bibasilar areas of scarring and/or atelectasis.  Electronically Signed   By: Virgina Norfolk M.D.   On: 04/26/2020 15:54 Note: Reviewed        Physical Exam  General appearance: Well nourished, well developed, and well hydrated. In no apparent acute distress Mental status: Alert, oriented x 3 (person, place, & time)       Respiratory: No evidence of acute respiratory distress Eyes: PERLA Vitals: BP 99/72   Pulse 78   Temp (!) 97.1 F (36.2 C)   Resp 16   Ht _0  (1.6 m)   Wt 232 lb (105.2 kg)   SpO2 100%   BMI 41.10 kg/m  BMI: Estimated body mass index is 41.1 kg/m as calculated from the following:   Height as of this encounter: _1  (1.6 m).   Weight as of this encounter: 232 lb (105.2 kg). Ideal: Ideal body weight: 52.4 kg (115 lb 8.3 oz) Adjusted ideal body weight: 73.5 kg (162 lb 1.8 oz)  Lumbar Spine Area Exam  Skin & Axial Inspection: Lumbar Scoliosis Alignment: Asymmetric Functional ROM: Pain restricted ROM       Stability: No instability  detected Muscle Tone/Strength: Functionally intact. No obvious neuro-muscular anomalies detected. Sensory (Neurological): Musculoskeletal pain pattern  Gait & Posture Assessment  Ambulation: Patient came in today in a wheel chair Gait: Limited. Using assistive device to ambulate Posture: Difficulty standing up straight, due to pain  Lower Extremity Exam    Side: Right lower extremity  Side: Left lower extremity  Stability: No instability observed          Stability: No instability observed          Skin & Extremity Inspection: Skin color, temperature, and hair growth are WNL. No peripheral edema or cyanosis. No masses, redness, swelling, asymmetry, or associated skin lesions. No contractures.  Skin & Extremity Inspection: Skin color, temperature, and hair growth are WNL. No peripheral edema or cyanosis. No masses, redness, swelling, asymmetry, or associated skin lesions. No contractures.  Functional ROM: Pain restricted ROM for all joints of the lower extremity Limited SLR (straight leg raise)  Functional ROM: Pain restricted ROM for all joints of the lower extremity Limited SLR (straight leg raise)  Muscle Tone/Strength: Functionally intact. No obvious neuro-muscular anomalies detected.  Muscle Tone/Strength: Functionally intact. No obvious neuro-muscular anomalies detected.  Sensory (Neurological): Dermatomal pain pattern        Sensory (Neurological): Dermatomal pain pattern        DTR: Patellar: deferred today Achilles: deferred today Plantar: deferred today  DTR: Patellar: deferred today Achilles: deferred today Plantar: deferred today  Palpation: No palpable anomalies  Palpation: No palpable anomalies     Assessment   Status Diagnosis  Controlled Controlled Controlled 1. Chronic pain syndrome   2. Chronic abdominal pain (Third area of Pain) (Bilateral) (L>R)   3. Lumbar facet syndrome (Bilateral) (L>R)   4. Lumbar spondylosis   5. Visceral abdominal pain   6.  Musculoskeletal pain   7. Chronic low back pain (Primary Area of Pain) (Bilateral) (L>R)      Plan of Care  Ms. CLARECE DRZEWIECKI has a current medication list which includes the following long-term medication(s): amiodarone, atorvastatin, duloxetine, humalog kwikpen, levothyroxine, warfarin, [START ON 05/04/2020] oxycodone, [START ON 06/03/2020] oxycodone, and [START ON 07/03/2020] oxycodone.  Pharmacotherapy (Medications Ordered): Meds ordered this encounter  Medications  . oxyCODONE (OXY IR/ROXICODONE) 5 MG immediate release tablet    Sig: Take 1 tablet (5  mg total) by mouth every 8 (eight) hours as needed for severe pain. Must last 30 days    Dispense:  90 tablet    Refill:  0    Chronic Pain: STOP Act (Not applicable) Fill 1 day early if closed on refill date.  . Alpha-Lipoic Acid 600 MG CAPS    Sig: Take 1 capsule (600 mg total) by mouth daily.    Dispense:  30 capsule    Refill:  2    Do not place medication on "Automatic Refill". Fill one day early if pharmacy is closed on scheduled refill date.  Marland Kitchen oxyCODONE (OXY IR/ROXICODONE) 5 MG immediate release tablet    Sig: Take 1 tablet (5 mg total) by mouth every 8 (eight) hours as needed for severe pain. Must last 30 days    Dispense:  90 tablet    Refill:  0    Chronic Pain: STOP Act (Not applicable) Fill 1 day early if closed on refill date.  Marland Kitchen oxyCODONE (OXY IR/ROXICODONE) 5 MG immediate release tablet    Sig: Take 1 tablet (5 mg total) by mouth every 8 (eight) hours as needed for severe pain. Must last 30 days    Dispense:  90 tablet    Refill:  0    Chronic Pain: STOP Act (Not applicable) Fill 1 day early if closed on refill date.   Recommend TENS unit.  Patient has failed gabapentin and Lyrica in the past.  Is already on Cymbalta.  Follow-up plan:   Return in about 3 months (around 08/02/2020) for Medication Management, in person.   Recent Visits Date Type Provider Dept  02/02/20 Telemedicine Gillis Santa, MD [facility]  Showing recent visits within past 90 days and meeting all other requirements Today's Visits Date Type Provider Dept  05/02/20 Office Visit Gillis Santa, MD [facility]  Showing today's visits and meeting all other requirements Future Appointments No visits were found meeting these conditions. Showing future appointments within next 90 days and meeting all other requirements  I discussed the assessment and treatment plan with the patient. The patient was provided an opportunity to ask questions and all were answered. The patient agreed with the plan and demonstrated an understanding of the instructions.  Patient advised to call back or seek an in-person evaluation if the symptoms or condition worsens.  Duration of encounter: 31mnutes.  Note by: BGillis Santa MD Date: 05/02/2020; Time: 3:11 PM

## 2020-05-02 NOTE — Progress Notes (Signed)
Nursing Pain Medication Assessment:  Safety precautions to be maintained throughout the outpatient stay will include: orient to surroundings, keep bed in low position, maintain call bell within reach at all times, provide assistance with transfer out of bed and ambulation.  Medication Inspection Compliance: Pill count conducted under aseptic conditions, in front of the patient. Neither the pills nor the bottle was removed from the patient's sight at any time. Once count was completed pills were immediately returned to the patient in their original bottle.  Medication: Oxycodone IR Pill/Patch Count: 8 of 90 pills remain Pill/Patch Appearance: Markings consistent with prescribed medication Bottle Appearance: Standard pharmacy container. Clearly labeled. Filled Date: 2 / 28 / 2022 Last Medication intake:  Day before yesterday

## 2020-05-04 ENCOUNTER — Telehealth: Payer: Self-pay | Admitting: Primary Care

## 2020-05-04 NOTE — Telephone Encounter (Signed)
Spoke with patient's daughter, Morningstar Toft, regarding the Palliative referral/services and all questions were answered and she was in agreement with scheduling visit.  I have scheduled an In-home Consult for 05/18/20 @ 12:30 PM

## 2020-05-05 ENCOUNTER — Telehealth: Payer: Self-pay | Admitting: Primary Care

## 2020-05-05 NOTE — Telephone Encounter (Signed)
Called daughter back to let her know that the Palliative NP will have a student with her that day and wanted to know if this was okay with them and daughter requested that the student not be there for the initial visit.  Told daughter that I would need to speak with NP to see if I needed to reschedule this visit and I would call her back to let her know and she was in agreement with this.

## 2020-05-05 NOTE — Telephone Encounter (Signed)
Called daughter back and left a message to let her know that the Palliative NP would need to reschedule the Initial Consult, this was rescheduled for 05/16/20 @ 12:30 PM.  Asked daughter to call me back if this was not good for them, left my name and call back number.

## 2020-05-16 ENCOUNTER — Other Ambulatory Visit: Payer: Medicare Other | Admitting: Primary Care

## 2020-05-18 ENCOUNTER — Other Ambulatory Visit: Payer: Medicare Other | Admitting: Primary Care

## 2020-05-23 ENCOUNTER — Telehealth: Payer: Self-pay | Admitting: Primary Care

## 2020-05-23 ENCOUNTER — Telehealth: Payer: Self-pay

## 2020-05-23 NOTE — Telephone Encounter (Signed)
Returned call to daughter, Sharonann Malbrough, and she stated that patient wasn't doing well and wanted to know if the Palliative NP could see the patient sooner.  I spoke with NP and was able to reschedule the Palliative Consult for 05/24/20 @ 3 PM.

## 2020-05-23 NOTE — Telephone Encounter (Signed)
Called and talked with patients daughter. She states that her mother keeps falling and has been taking more medications. She is currently taking Oxycodone 2 tabs in am and 2 tabs in pm. She is also taking Tylenol with these. [name] is in the end stage of life and palliative care has been called in. They currently have 5 pills remaining and would like to fill early. [name] next fill date is 06/04/20.   Can she fill early?

## 2020-05-23 NOTE — Telephone Encounter (Signed)
Oxycodone 5 mg, pt fell again and can not stand up on her left leg with her being in pain she increased her intake on Oxycodone along with Tylenol 1000 Mg, she wants to know if she can pick the prescription up earlier than 05/1720 due to her mother have 2 pills left she have arranged palliative care .

## 2020-05-23 NOTE — Telephone Encounter (Signed)
Called patients daughter and inform her of Dr Elmon Else response. Daughter with understanding with understanding.

## 2020-05-24 ENCOUNTER — Other Ambulatory Visit: Payer: Medicare Other | Admitting: Primary Care

## 2020-05-24 ENCOUNTER — Telehealth: Payer: Self-pay | Admitting: Student in an Organized Health Care Education/Training Program

## 2020-05-24 ENCOUNTER — Telehealth: Payer: Self-pay

## 2020-05-24 ENCOUNTER — Other Ambulatory Visit: Payer: Self-pay

## 2020-05-24 DIAGNOSIS — I5022 Chronic systolic (congestive) heart failure: Secondary | ICD-10-CM

## 2020-05-24 DIAGNOSIS — E66812 Obesity, class 2: Secondary | ICD-10-CM

## 2020-05-24 DIAGNOSIS — N185 Chronic kidney disease, stage 5: Secondary | ICD-10-CM

## 2020-05-24 DIAGNOSIS — Z515 Encounter for palliative care: Secondary | ICD-10-CM

## 2020-05-24 DIAGNOSIS — E669 Obesity, unspecified: Secondary | ICD-10-CM

## 2020-05-24 DIAGNOSIS — G894 Chronic pain syndrome: Secondary | ICD-10-CM

## 2020-05-24 NOTE — Progress Notes (Signed)
[facility] Consult Note Telephone: [phone]  Fax: [phone]    Date of encounter: 05/24/20 PATIENT NAME: [name] [address] [place] [address]   [phone] (home)  DOB: 1951-01-22 MRN: [id number] PRIMARY CARE PROVIDER:    Adin Hector, MD,  [facility] [place] [address] [phone]  REFERRING PROVIDER:   Adin Hector, MD [facility] [place],  [place] [address] [phone]  RESPONSIBLE PARTY:    Contact Information    Name Relation Home Work [place] D Daughter [phone]  [phone]   Wonda Amis [phone]  [phone]   Willene Hatchet Sister [phone]  [phone]      I met face to face with patient and family in the  home. Palliative Care was asked to follow this patient by consultation request of  Adin Hector, MD to address advance care planning and complex medical decision making. This is the initial visit.    ASSESSMENT AND RECOMMENDATIONS:   1. Advance Care Planning/Goals of Care: Goals include to maximize quality of life and symptom management. Our advance care planning conversation included a discussion about:     The value and importance of advance care planning   Experiences with loved ones who have been seriously ill or have died   Exploration of personal, cultural or spiritual beliefs that might influence medical decisions   Exploration of goals of care in the event of a sudden injury or illness   Identification and preparation of a healthcare agent   Review of an  advance directive document .  Discussion with daughter Luetta Nutting RE grave illness and medical interventive choices. We discussed pt progressing debility and recent diagnosis by cardiology that her time was very short. Discussed quality vs quantity in context of ESRD and now, her immobility making it very hard to  get to the HD clinic. Answered questions RE EOL process, hospice services, and support services for difficult decisions.  Amber states she will likely choose DNR but needs some time to think it over. We reviewed the MOST form. If she presents to ED or hospice she will need the DNR form completed.   I spent 30 minutes providing this consultation. More than 50% of the time in this consultation was spent in counseling and care coordination.  _________________________________________________________________________  2. Symptom Management:   Pain: Patient is known to chronic pain clinic. Fell 10 days ago but 2 days ago became much more painful. Daughter gave more narcotic than normally taken due to increased pain.  She only has 20 mg left now and pain is of unknown etiology. The pain is so severe patient can no longer transport to HD and has missed treatment yesterday and will miss tomorrow.   Due to complex clinical picture, in home radiography would be of limited value. I have recommended that patient go to ED tomorrow for radiology assessment of the R hip and back pain she endorses.   I have recommended Acetaminophen CR 650 mg - 1300 mg q 8 hrs for pain while assessing for etiology.  Possible fracture: Patient had fall 10 days ago, increased in pain of R hip 2 days ago. Unable to assess function as she is not able to get oob. No DME to support immobility such as electric hospital bed or hoyer lift. Discussed getting Dx to inform ACP decisions especially ability to continue with HD.  ESRD:  Patient has L arm shunt and goes to HD 3 days a week. Creatinine 6.9 and GFT = 8 on last labs noted. She missed yesterday and will likely miss tomorrow as well due to not being able to transport. Called to [facility] to arrange w/c Lucianne Lei. There is a TWO WEEK WAITING LIST FOR VAN TRANSPORT TO DIALYSIS. Clearly this will not be acceptable and patient will need to present to ED tomorrow to have work up for possible fx  and to possibly be dialyzed.    EOL decisions: Discussed the indication for d/c of HD and hospice admission with daughter. She would like the information of the etiology of the pain before making that decision. However, if patient's pain and immobility is more EOL process, she may elect to stop HD. At this time she would like more information to inform that decision. Was told by cardiology she had a month life expectancy due to compromised LV function,  mass on valve and EF of 25%  3. Follow up Palliative Care Visit: Palliative care will continue to follow for advance care planning and clarification,  and symptom management. Return 1 week or prn.  4. Family /Caregiver/Community Supports: lives with daughter who is sole caregiver in home. Pt sister visits and also helps.   5. Cognitive / Functional decline: A and O x 2, mild cognitive impairment, dependent in all adls and iadls.  This visit was coded based on medical decision making (MDM).  CODE STATUS: TBD, full   PPS: 30%  HOSPICE ELIGIBILITY/DIAGNOSIS: yes with concordant goals of care/ESRD if d/c HD/  Medical reason for visit: pain, immobility  HISTORY OF PRESENT ILLNESS:  [name] is a 70 y.o. year old female  with Recent fall and hip pain in context of weakness and debility from end stage cardiac and renal disease. She now endorses 8/10 constant pain in R hip and lumbar back.Narcotics have helped episodic  pain but it has not improved over all.  History obtained from review of EMR, discussion with primary team, and  interview with family, caregiver  and/or Ms. Oubre. Records reviewed and summarized above.  Review lab tests Recent creatine 6.9, GFR 8, INR 1.3 Review medicine study results Reviewed Echo report of 05/18/20 from [facility] system.  ROS  General: c/o pain in R hip, 8/10 ENMT: denies dysphagia Cardiovascular: denies chest pain, denies DOE Pulmonary: denies cough, denies increased SOB Abdomen: endorses good  appetite, denies constipation, endorses continence of bowel GU: denies dysuria, endorses continence of urine MSK:  endorses weakness,  Several falls reported Skin: denies rashes or wounds Neurological: endorses pain, denies insomnia Psych: Endorses discouraged mood Heme/lymph/immuno: denies bruises, abnormal bleeding  Physical Exam: Current and past weights: 230 lbs reported Constitutional: NAD General: frail appearing, obese  EYES: anicteric sclera, lids intact, no discharge  ENMT: intact hearing, oral mucous membranes moist, dentition intact CV: S1S2, RRR, no LE edema Pulmonary: LCTA, no increased work of breathing, no cough, room air Abdomen: intake 50%, normo-active BS + 4 quadrants, soft and non tender, no ascites GU: deferred MSK: mild sarcopenia, moves all extremities,non-ambulatory Skin: warm and dry, no rashes or wounds on visible skin Neuro:  + generalized weakness,  moderate cognitive impairment Psych: slightly anxious affect, A and O x 2 Hem/lymph/immuno: no widespread bruising  Thank you for the opportunity to participate in the care of Ms. Cotten.  The palliative care team will continue to follow. Please call our office at [phone] if we can be of additional  assistance.   Jason Coop, NP , DNP, MPH, AGPCNP-BC, [facility]   CURRENT PROBLEM LIST:  Patient Active Problem List   Diagnosis Date Noted  . Acute hypoxemic respiratory failure ([place]) 01/23/2020  . Complication from renal dialysis device 10/12/2019  . Acute on chronic diastolic CHF (congestive heart failure) ([place]) 09/07/2019  . Pharmacologic therapy 06/30/2019  . Chronic respiratory failure with hypoxia ([place]) 06/22/2019  . Hypoxia 06/10/2019  . Spondylosis without myelopathy or radiculopathy, lumbosacral region 11/05/2018  . DDD (degenerative disc disease), lumbosacral 11/05/2018  . Hematochezia 09/03/2018  . Hemorrhoids, internal 09/03/2018  . Proctitis 09/03/2018  . OSA (obstructive sleep  apnea) 08/24/2018  . Acute respiratory failure with hypoxia ([place]) 07/12/2018  . DM type 2 with diabetic peripheral neuropathy ([place]) 11/25/2017  . CKD (chronic kidney disease) stage 5, GFR less than 15 ml/min (HCC) 10/05/2017  . 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 Acute kidney injury superimposed on chronic kidney disease ([place]) 09/01/2017  . Chronic systolic CHF (congestive heart failure) ([place]) 08/19/2017  . Paroxysmal A-fib ([place]) 08/14/2017  . Cervical myelopathy ([place]) 08/07/2017  . Onychomycosis of multiple toenails with type 2 diabetes mellitus and peripheral neuropathy ([place]) 06/23/2017  . Myofascial pain 05/27/2017  . Chronic pain of left upper extremity 05/27/2017  . Morbid obesity ([place]) 05/06/2017  . Uncontrolled type 2 diabetes mellitus with hyperglycemia, with long-term current use of insulin ([place]) 02/07/2017  . Hyperlipidemia, mixed 08/08/2016  . Hx of adenomatous colonic polyps 06/14/2016  . 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 Chronic pain syndrome 03/11/2016  . Obesity, Class II, BMI 35-39.9 03/11/2016  . Osteopenia of multiple sites 01/26/2016  . Heart burn 12/18/2015  . Polyneuropathy 11/16/2015  . Lumbar spondylosis 09/25/2015  . Lumbar facet syndrome (Bilateral) (L>R) 09/25/2015  . Recurrent major depressive disorder, in full remission ([place]) 09/19/2015  . Diabetic neuropathy ([place]) 09/06/2015  . H/O colostomy 07/12/2015  . Long term current use of opiate analgesic 07/03/2015  . Long term prescription opiate use 07/03/2015  . Opiate use (22.5 MME/Day) 07/03/2015  . Encounter for therapeutic drug level monitoring 07/03/2015  . Encounter for pain management planning 07/03/2015  . Neurogenic pain 07/03/2015  . Chronic musculoskeletal pain 07/03/2015  . Visceral abdominal pain 07/03/2015  . Diabetic peripheral neuropathy ([place]) 07/03/2015  . Chronic low back pain (Primary Area of Pain) (Bilateral) (L>R) 07/03/2015  . Chronic  lower extremity pain (Secondary area of Pain) (Bilateral) (R>L) 07/03/2015  . Chronic hip pain (Bilateral) (L>R) 07/03/2015  . Chronic neck pain (Bilateral) (L>R) 07/03/2015  . Anemia 02/16/2015  . Arthritis 02/16/2015  . End stage renal disease ([place]) 02/16/2015  . Focal and segmental hyalinosis 02/16/2015  . Bleeding gastrointestinal 02/16/2015  . Acquired atrophy of thyroid 02/16/2015  . Psoriasis 02/16/2015  . Small bowel obstruction ([place]) 02/04/2015  . Severe aortic stenosis 10/11/2014  . Paroxysmal supraventricular tachycardia ([place]) 10/11/2014  . SOBOE (shortness of breath on exertion) 10/11/2014  . Benign essential HTN 10/04/2014  . Obesity 09/05/2014  . B12 deficiency 03/02/2014  . Diabetes mellitus type 2 in obese ([place]) 12/01/2013  . Abnormal presence of protein in urine 10/15/2010   PAST MEDICAL HISTORY:  Active Ambulatory Problems    Diagnosis Date Noted  . Obesity 09/05/2014  . Small bowel obstruction ([place]) 02/04/2015  . Anemia 02/16/2015  . Severe aortic stenosis 10/11/2014  . Arthritis 02/16/2015  . B12 deficiency 03/02/2014  . Benign essential HTN 10/04/2014  . End stage renal disease ([place]) 02/16/2015  .  Focal and segmental hyalinosis 02/16/2015  . Bleeding gastrointestinal 02/16/2015  . Acquired atrophy of thyroid 02/16/2015  . Abnormal presence of protein in urine 10/15/2010  . Psoriasis 02/16/2015  . Paroxysmal supraventricular tachycardia ([place]) 10/11/2014  . SOBOE (shortness of breath on exertion) 10/11/2014  . Diabetes mellitus type 2 in obese ([place]) 12/01/2013  . Long term current use of opiate analgesic 07/03/2015  . Long term prescription opiate use 07/03/2015  . Opiate use (22.5 MME/Day) 07/03/2015  . Encounter for therapeutic drug level monitoring 07/03/2015  . Encounter for pain management planning 07/03/2015  . Neurogenic pain 07/03/2015  . Chronic musculoskeletal pain 07/03/2015  . Visceral abdominal pain 07/03/2015  . Diabetic peripheral  neuropathy ([place]) 07/03/2015  . Chronic low back pain (Primary Area of Pain) (Bilateral) (L>R) 07/03/2015  . Chronic lower extremity pain (Secondary area of Pain) (Bilateral) (R>L) 07/03/2015  . Chronic hip pain (Bilateral) (L>R) 07/03/2015  . Chronic neck pain (Bilateral) (L>R) 07/03/2015  . H/O colostomy 07/12/2015  . Diabetic neuropathy ([place]) 09/06/2015  . Recurrent major depressive disorder, in full remission ([place]) 09/19/2015  . Lumbar spondylosis 09/25/2015  . Lumbar facet syndrome (Bilateral) (L>R) 09/25/2015  . Polyneuropathy 11/16/2015  . Heart burn 12/18/2015  . Osteopenia of multiple sites 01/26/2016  . 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 Chronic pain syndrome 03/11/2016  . Obesity, Class II, BMI 35-39.9 03/11/2016  . Hx of adenomatous colonic polyps 06/14/2016  . Hyperlipidemia, mixed 08/08/2016  . Uncontrolled type 2 diabetes mellitus with hyperglycemia, with long-term current use of insulin ([place]) 02/07/2017  . Morbid obesity ([place]) 05/06/2017  . Myofascial pain 05/27/2017  . Chronic pain of left upper extremity 05/27/2017  . Acute kidney injury superimposed on chronic kidney disease ([place]) 09/01/2017  . Cervical myelopathy ([place]) 08/07/2017  . Chronic systolic CHF (congestive heart failure) ([place]) 08/19/2017  . Onychomycosis of multiple toenails with type 2 diabetes mellitus and peripheral neuropathy ([place]) 06/23/2017  . Paroxysmal A-fib ([place]) 08/14/2017  . 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 DM type 2 with diabetic peripheral neuropathy ([place]) 11/25/2017  . Acute respiratory failure with hypoxia ([place]) 07/12/2018  . Hematochezia 09/03/2018  . Hemorrhoids, internal 09/03/2018  . Proctitis 09/03/2018  . Spondylosis without myelopathy or radiculopathy, lumbosacral region 11/05/2018  . DDD (degenerative disc disease), lumbosacral 11/05/2018  . CKD (chronic kidney disease) stage 5, GFR less than 15 ml/min (HCC) 10/05/2017  . Hypoxia  06/10/2019  . Chronic respiratory failure with hypoxia ([place]) 06/22/2019  . OSA (obstructive sleep apnea) 08/24/2018  . Pharmacologic therapy 06/30/2019  . Acute on chronic diastolic CHF (congestive heart failure) ([place]) 09/07/2019  . Complication from renal dialysis device 10/12/2019  . Acute hypoxemic respiratory failure ([place]) 01/23/2020   Resolved Ambulatory Problems    Diagnosis Date Noted  . Diverticulitis of colon 09/05/2014  . Incarcerated hernia   . Diverticulitis 02/16/2015  . Open wnd anterior abdomen 10/15/2013  . Wound, surgical, infected 02/21/2015  . Abdominal wall abscess at site of surgical wound   . Colostomy, retracted ([place]) 05/10/2015  . Preop examination   . Chronic abdominal pain (Third area of Pain) (Bilateral) (L>R) 07/03/2015  . Chronic knee pain (Bilateral) (L>R) 07/03/2015   Past Medical History:  Diagnosis Date  . Anxiety   . Aortic valve stenosis   . Arrhythmia   . Bowel obstruction ([place])   . CHF (congestive heart failure) ([place])   . CKD (chronic kidney disease)   . Colostomy in place [facility])   . Diabetes  mellitus without complication ([place])   . Dialysis patient ([place])   . Diverticulitis large intestine   . Dysrhythmia   . Ectopic atrial tachycardia ([place])   . Gastritis   . GI bleed   . Heart murmur   . Hyperlipidemia   . Hypertension   . Hypothyroidism   . Hypothyroidism   . MRSA (methicillin resistant Staphylococcus aureus)   . Neuropathy   . Neuropathy in diabetes ([place])   . Pedal edema   . Shortness of breath dyspnea   . Sleep apnea   . Vertigo    SOCIAL HX:  Social History   Tobacco Use  . Smoking status: Former Smoker    Types: Cigarettes    Quit date: 04/18/2013    Years since quitting: 7.1  . Smokeless tobacco: Never Used  Substance Use Topics  . Alcohol use: No    Alcohol/week: 0.0 standard drinks   FAMILY HX:  Family History  Problem Relation Age of Onset  . Diabetes Mother   . Hypertension Father   . CAD Father   . Heart  attack Father   . Colon cancer Brother       ALLERGIES:  Allergies  Allergen Reactions  . Buspirone Other (See Comments)    Weakness  . Diltiazem Hcl Palpitations  . Gabapentin Palpitations  . Hydralazine Rash  . Keflex [Cephalexin] Rash  . Lisinopril Cough  . Lovastatin Palpitations  . Metformin Diarrhea  . Pravastatin Other (See Comments)    Insomnia  . Sitagliptin Other (See Comments)    Constipation  . Tramadol Itching     PERTINENT MEDICATIONS:  Outpatient Encounter Medications as of 05/24/2020  Medication Sig  . amiodarone (PACERONE) 200 MG tablet Take 1 tablet (200 mg total) by mouth daily.  . calcium acetate (PHOSLO) 667 MG capsule Take 1,334 mg by mouth 2 (two) times daily.  . DULoxetine (CYMBALTA) 30 MG capsule Take 30 mg by mouth daily.   Marland Kitchen levothyroxine (SYNTHROID, LEVOTHROID) 88 MCG tablet Take 88 mcg by mouth daily before breakfast.   . midodrine (PROAMATINE) 10 MG tablet Take 10 mg by mouth See admin instructions. Take 1 tablet (107m) twice daily before and after dialysis on Monday, Wednesday and Friday  . oxyCODONE (OXY IR/ROXICODONE) 5 MG immediate release tablet Take 1 tablet (5 mg total) by mouth every 8 (eight) hours as needed for severe pain. Must last 30 days  . polyethylene glycol (MIRALAX / GLYCOLAX) 17 g packet Take 17 g by mouth daily as needed for moderate constipation.  . torsemide (DEMADEX) 20 MG tablet Take 40 mg by mouth daily.   . Vitamin D, Ergocalciferol, (DRISDOL) 1.25 MG (50000 UT) CAPS capsule Take 50,000 Units by mouth every Tuesday.  . warfarin (COUMADIN) 2 MG tablet Take 1 tablet (2 mg total) by mouth daily. (Patient taking differently: Take 1.5 mg by mouth daily.)  . Alpha-Lipoic Acid 600 MG CAPS Take 1 capsule (600 mg total) by mouth daily. (Patient not taking: Reported on 05/24/2020)  . atorvastatin (LIPITOR) 40 MG tablet Take 40 mg by mouth daily.  (Patient not taking: Reported on 05/24/2020)  . HUMALOG KWIKPEN 100 UNIT/ML KwikPen Inject  0.22-0.38 mLs (22-38 Units total) into the skin See admin instructions. Inject 22u under the skin before breakfast and inject 38u under the skin before supper (Patient not taking: Reported on 05/24/2020)  . insulin glargine (LANTUS) 100 unit/mL SOPN Inject 5 Units into the skin at bedtime as needed (based upon blood glucose reading).  (Patient not  taking: Reported on 05/24/2020)  . ondansetron (ZOFRAN ODT) 4 MG disintegrating tablet Take 1 tablet (4 mg total) by mouth every 8 (eight) hours as needed for nausea or vomiting. (Patient not taking: Reported on 05/24/2020)  . [START ON 06/03/2020] oxyCODONE (OXY IR/ROXICODONE) 5 MG immediate release tablet Take 1 tablet (5 mg total) by mouth every 8 (eight) hours as needed for severe pain. Must last 30 days  . [START ON 07/03/2020] oxyCODONE (OXY IR/ROXICODONE) 5 MG immediate release tablet Take 1 tablet (5 mg total) by mouth every 8 (eight) hours as needed for severe pain. Must last 30 days  . [DISCONTINUED] DUPIXENT 300 MG/2ML SOSY Inject 300 mg into the skin every 14 (fourteen) days.   No facility-administered encounter medications on file as of 05/24/2020.    COVID-19 PATIENT SCREENING TOOL Asked and negative response unless otherwise noted:   Have you had symptoms of covid, tested positive or been in contact with someone with symptoms/positive test in the past 5-10 days?

## 2020-05-24 NOTE — Telephone Encounter (Signed)
RN reached out to patient's family.  They report recent fall, but that NP was visiting today from palliative care.  Returned previous call to Dr. Olin Pia office, they relayed update of fall and patient's c/o hip pain since fall. Relayed info to NP and she will assess and recommend as appropriate.

## 2020-05-24 NOTE — Telephone Encounter (Signed)
Attempted to return the phone call, no answer and mailbox is full.

## 2020-05-24 NOTE — Telephone Encounter (Signed)
Patient's sister Daine Floras wants to speak with a Nurse or with Dr. Holley Raring, would not say why

## 2020-05-25 ENCOUNTER — Emergency Department: Payer: Medicare Other

## 2020-05-25 ENCOUNTER — Emergency Department
Admission: EM | Admit: 2020-05-25 | Discharge: 2020-05-25 | Disposition: A | Discharge status: Home / Self Care | Payer: Medicare Other | Attending: Emergency Medicine | Admitting: Emergency Medicine

## 2020-05-25 ENCOUNTER — Other Ambulatory Visit: Payer: Self-pay

## 2020-05-25 ENCOUNTER — Ambulatory Visit (INDEPENDENT_AMBULATORY_CARE_PROVIDER_SITE_OTHER): Payer: Medicare Other | Admitting: Vascular Surgery

## 2020-05-25 ENCOUNTER — Other Ambulatory Visit: Payer: Medicare Other | Admitting: Primary Care

## 2020-05-25 ENCOUNTER — Encounter (INDEPENDENT_AMBULATORY_CARE_PROVIDER_SITE_OTHER): Payer: Medicare Other

## 2020-05-25 DIAGNOSIS — N186 End stage renal disease: Secondary | ICD-10-CM | POA: Insufficient documentation

## 2020-05-25 DIAGNOSIS — Z7901 Long term (current) use of anticoagulants: Secondary | ICD-10-CM | POA: Insufficient documentation

## 2020-05-25 DIAGNOSIS — Z79899 Other long term (current) drug therapy: Secondary | ICD-10-CM | POA: Insufficient documentation

## 2020-05-25 DIAGNOSIS — I480 Paroxysmal atrial fibrillation: Secondary | ICD-10-CM | POA: Insufficient documentation

## 2020-05-25 DIAGNOSIS — Y92009 Unspecified place in unspecified non-institutional (private) residence as the place of occurrence of the external cause: Secondary | ICD-10-CM | POA: Diagnosis not present

## 2020-05-25 DIAGNOSIS — W07XXXA Fall from chair, initial encounter: Secondary | ICD-10-CM | POA: Insufficient documentation

## 2020-05-25 DIAGNOSIS — I5033 Acute on chronic diastolic (congestive) heart failure: Secondary | ICD-10-CM | POA: Insufficient documentation

## 2020-05-25 DIAGNOSIS — G8911 Acute pain due to trauma: Secondary | ICD-10-CM | POA: Insufficient documentation

## 2020-05-25 DIAGNOSIS — M545 Low back pain, unspecified: Secondary | ICD-10-CM | POA: Insufficient documentation

## 2020-05-25 DIAGNOSIS — Z87891 Personal history of nicotine dependence: Secondary | ICD-10-CM | POA: Insufficient documentation

## 2020-05-25 DIAGNOSIS — Z794 Long term (current) use of insulin: Secondary | ICD-10-CM | POA: Insufficient documentation

## 2020-05-25 DIAGNOSIS — I132 Hypertensive heart and chronic kidney disease with heart failure and with stage 5 chronic kidney disease, or end stage renal disease: Secondary | ICD-10-CM | POA: Diagnosis not present

## 2020-05-25 DIAGNOSIS — E039 Hypothyroidism, unspecified: Secondary | ICD-10-CM | POA: Insufficient documentation

## 2020-05-25 DIAGNOSIS — M25551 Pain in right hip: Secondary | ICD-10-CM | POA: Diagnosis not present

## 2020-05-25 DIAGNOSIS — J449 Chronic obstructive pulmonary disease, unspecified: Secondary | ICD-10-CM | POA: Diagnosis not present

## 2020-05-25 DIAGNOSIS — Z992 Dependence on renal dialysis: Secondary | ICD-10-CM | POA: Insufficient documentation

## 2020-05-25 DIAGNOSIS — G894 Chronic pain syndrome: Secondary | ICD-10-CM

## 2020-05-25 DIAGNOSIS — Z515 Encounter for palliative care: Secondary | ICD-10-CM

## 2020-05-25 DIAGNOSIS — E1122 Type 2 diabetes mellitus with diabetic chronic kidney disease: Secondary | ICD-10-CM | POA: Insufficient documentation

## 2020-05-25 LAB — BASIC METABOLIC PANEL
Anion gap: 12 (ref 5–15)
BUN: 37 mg/dL — ABNORMAL HIGH (ref 8–23)
CO2: 25 mmol/L (ref 22–32)
Calcium: 8.5 mg/dL — ABNORMAL LOW (ref 8.9–10.3)
Chloride: 94 mmol/L — ABNORMAL LOW (ref 98–111)
Creatinine, Ser: 5.95 mg/dL — ABNORMAL HIGH (ref 0.44–1.00)
GFR, Estimated: 7 mL/min — ABNORMAL LOW (ref >60)
Glucose, Bld: 114 mg/dL — ABNORMAL HIGH (ref 70–99)
Potassium: 4.7 mmol/L (ref 3.5–5.1)
Sodium: 131 mmol/L — ABNORMAL LOW (ref 135–145)

## 2020-05-25 NOTE — ED Triage Notes (Addendum)
BIB [facility] from home due to continued right hip and lower back pain and since fall X 2 weeks ago. States she was not evaluated after fall and continues to have pain. Pt on dialysis MWF. Wears 3L [place] chronically. Hx of afib. EDP at bedside. VSS with EMS. Alert and oriented X4.   Nothing changed except PCP advised pt come for xray today.

## 2020-05-25 NOTE — ED Provider Notes (Signed)
[name] [place] [facility] Provider Note   ____________________________________________   Event Date/Time   First MD Initiated Contact with Patient 05/25/20 [phone]     (approximate)  I have reviewed the triage vital signs and the nursing notes.   HISTORY  Chief Complaint Fall    HPI [name] is a 70 y.o. female with a past medical history of COPD, A. fib, and end-stage renal disease on dialysis M/W/F who presents for right hip and right lumbar back pain after she slid out of her chair 2 weeks prior to arrival.  Patient states that she was seen at her primary care physician's office today who sent her due to continued pain in order to have x-rays.  Patient wears 3 L nasal cannula oxygen chronically.  Patient scribes an aching, 10/10, nonradiating, right lumbar and right hip pain that is worse with any movement.  Patient however does not ambulate very well even before this trauma and was getting around with a Rollator but has been continually deteriorating over the last few months according to family.  Patient currently denies any vision changes, tinnitus, difficulty speaking, facial droop, sore throat, chest pain, shortness of breath, abdominal pain, nausea/vomiting/diarrhea, dysuria, or weakness/numbness/paresthesias in any extremity         Past Medical History:  Diagnosis Date  . Anemia   . Anxiety   . Aortic valve stenosis   . Arrhythmia    atrial fibrillation  . Arthritis    feet, legs  . B12 deficiency   . Bowel obstruction ([place])   . CHF (congestive heart failure) ([place])   . CKD (chronic kidney disease)    protein in urine  . Colostomy in place [name])   . Diabetes mellitus without complication ([place])   . Dialysis patient [facility])    T/Th/Sa  . Diverticulitis large intestine   . Dysrhythmia   . Ectopic atrial tachycardia ([place])   . FSGS (focal segmental glomerulosclerosis)   . Gastritis   . GI bleed   . Heart murmur   .  Hyperlipidemia   . Hypertension   . Hypothyroidism   . Hypothyroidism    unspecified  . MRSA (methicillin resistant Staphylococcus aureus)    at abdominal wound.  Jan 2017.  Treated.   . Neuropathy   . Neuropathy in diabetes ([place])   . Obesity   . Pedal edema   . Psoriasis   . Shortness of breath dyspnea   . Sleep apnea    CPAP  . Vertigo     Patient Active Problem List   Diagnosis Date Noted  . Acute hypoxemic respiratory failure ([place]) 01/23/2020  . Complication from renal dialysis device 10/12/2019  . Acute on chronic diastolic CHF (congestive heart failure) ([place]) 09/07/2019  . Pharmacologic therapy 06/30/2019  . Chronic respiratory failure with hypoxia ([place]) 06/22/2019  . Hypoxia 06/10/2019  . Spondylosis without myelopathy or radiculopathy, lumbosacral region 11/05/2018  . DDD (degenerative disc disease), lumbosacral 11/05/2018  . Hematochezia 09/03/2018  . Hemorrhoids, internal 09/03/2018  . Proctitis 09/03/2018  . OSA (obstructive sleep apnea) 08/24/2018  . Acute respiratory failure with hypoxia ([place]) 07/12/2018  . DM type 2 with diabetic peripheral neuropathy ([place]) 11/25/2017  . CKD (chronic kidney disease) stage 5, GFR less than 15 ml/min (HCC) 10/05/2017  . 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 Acute kidney injury superimposed on chronic kidney disease ([place]) 09/01/2017  . Chronic systolic CHF (congestive heart failure) ([place]) 08/19/2017  . Paroxysmal A-fib ([place])  08/14/2017  . Cervical myelopathy ([place]) 08/07/2017  . Onychomycosis of multiple toenails with type 2 diabetes mellitus and peripheral neuropathy ([place]) 06/23/2017  . Myofascial pain 05/27/2017  . Chronic pain of left upper extremity 05/27/2017  . Morbid obesity ([place]) 05/06/2017  . Uncontrolled type 2 diabetes mellitus with hyperglycemia, with long-term current use of insulin ([place]) 02/07/2017  . Hyperlipidemia, mixed 08/08/2016  . Hx of adenomatous colonic polyps 06/14/2016  . FSGS  (focal segmental glomerulosclerosis) 03/11/2016  . Chronic pain syndrome 03/11/2016  . Obesity, Class II, BMI 35-39.9 03/11/2016  . Osteopenia of multiple sites 01/26/2016  . Heart burn 12/18/2015  . Polyneuropathy 11/16/2015  . Lumbar spondylosis 09/25/2015  . Lumbar facet syndrome (Bilateral) (L>R) 09/25/2015  . Recurrent major depressive disorder, in full remission ([place]) 09/19/2015  . Diabetic neuropathy ([place]) 09/06/2015  . H/O colostomy 07/12/2015  . Long term current use of opiate analgesic 07/03/2015  . Long term prescription opiate use 07/03/2015  . Opiate use (22.5 MME/Day) 07/03/2015  . Encounter for therapeutic drug level monitoring 07/03/2015  . Encounter for pain management planning 07/03/2015  . Neurogenic pain 07/03/2015  . Chronic musculoskeletal pain 07/03/2015  . Visceral abdominal pain 07/03/2015  . Diabetic peripheral neuropathy ([place]) 07/03/2015  . Chronic low back pain (Primary Area of Pain) (Bilateral) (L>R) 07/03/2015  . Chronic lower extremity pain (Secondary area of Pain) (Bilateral) (R>L) 07/03/2015  . Chronic hip pain (Bilateral) (L>R) 07/03/2015  . Chronic neck pain (Bilateral) (L>R) 07/03/2015  . Anemia 02/16/2015  . Arthritis 02/16/2015  . End stage renal disease ([place]) 02/16/2015  . Focal and segmental hyalinosis 02/16/2015  . Bleeding gastrointestinal 02/16/2015  . Acquired atrophy of thyroid 02/16/2015  . Psoriasis 02/16/2015  . Small bowel obstruction ([place]) 02/04/2015  . Severe aortic stenosis 10/11/2014  . Paroxysmal supraventricular tachycardia ([place]) 10/11/2014  . SOBOE (shortness of breath on exertion) 10/11/2014  . Benign essential HTN 10/04/2014  . Obesity 09/05/2014  . B12 deficiency 03/02/2014  . Diabetes mellitus type 2 in obese ([place]) 12/01/2013  . Abnormal presence of protein in urine 10/15/2010    Past Surgical History:  Procedure Laterality Date  . A/V SHUNT INTERVENTION N/A 01/25/2020   Procedure: A/V SHUNT INTERVENTION;   Surgeon: Katha Cabal, MD;  Location: [place] [facility];  Service: Cardiovascular;  Laterality: N/A;  . ABDOMINAL HYSTERECTOMY    . APPLICATION OF WOUND VAC N/A 02/24/2015   Procedure: APPLICATION OF WOUND VAC;  Surgeon: Clayburn Pert, MD;  Location: [facility];  Service: General;  Laterality: N/A;  . APPLICATION OF WOUND VAC N/A 02/26/2015   Procedure: APPLICATION OF WOUND VAC;  Surgeon: Clayburn Pert, MD;  Location: [facility];  Service: General;  Laterality: N/A;  . AV FISTULA PLACEMENT Left 01/29/2019   Procedure: ARTERIOVENOUS (AV) FISTULA CREATION ( BRACHIAL CEPHALIC);  Surgeon: Katha Cabal, MD;  Location: [facility];  Service: Vascular;  Laterality: Left;  . BACK SURGERY     spur frmoved from lower back  . CARDIAC CATHETERIZATION    . CARDIAC VALVE REPLACEMENT  08/2017   [facility]  . CARDIOVERSION N/A 07/15/2018   Procedure: CARDIOVERSION;  Surgeon: Corey Skains, MD;  Location: [facility];  Service: Cardiovascular;  Laterality: N/A;  . CATARACT EXTRACTION W/PHACO Right 11/16/2018   Procedure: CATARACT EXTRACTION PHACO AND INTRAOCULAR LENS PLACEMENT (IOC) RIGHT DIABETiC  01:18.3  13.5%  10.82;  Surgeon: Eulogio Bear, MD;  Location: [place];  Service: Ophthalmology;  Laterality: Right;  Diabetic - insulin sleep apnea  .  CATARACT EXTRACTION W/PHACO Left 12/07/2018   Procedure: CATARACT EXTRACTION PHACO AND INTRAOCULAR LENS PLACEMENT (IOC) LEFT DIABETIC 01:02.6  8.8%  5.77;  Surgeon: Eulogio Bear, MD;  Location: [place];  Service: Ophthalmology;  Laterality: Left;  Diabetic - insulin  . CHOLECYSTECTOMY    . COLECTOMY    . COLONOSCOPY WITH PROPOFOL N/A 09/04/2016   Procedure: COLONOSCOPY WITH PROPOFOL;  Surgeon: Manya Silvas, MD;  [facility];  Service: Endoscopy;  Laterality: N/A;  . COLOSTOMY REVERSAL N/A 07/12/2015   Procedure: COLOSTOMY REVERSAL;  Surgeon: Clayburn Pert, MD;  Location: [facility];  Service: General;  Laterality:  N/A;  . DEBRIDEMENT OF ABDOMINAL WALL ABSCESS N/A 02/22/2015   Procedure: DEBRIDEMENT OF ABDOMINAL WALL ABSCESS;  Surgeon: Clayburn Pert, MD;  Location: [facility];  Service: General;  Laterality: N/A;  . DIALYSIS/PERMA CATHETER INSERTION N/A 10/20/2018   Procedure: DIALYSIS/PERMA CATHETER INSERTION;  Surgeon: Katha Cabal, MD;  Location: [place] [facility];  Service: Cardiovascular;  Laterality: N/A;  . DIALYSIS/PERMA CATHETER REMOVAL N/A 08/03/2019   Procedure: DIALYSIS/PERMA CATHETER REMOVAL;  Surgeon: Katha Cabal, MD;  Location: [place] [facility];  Service: Cardiovascular;  Laterality: N/A;  . EXCISION MASS ABDOMINAL N/A 02/24/2015   Procedure: EXCISION MASS ABDOMINAL  / [place];  Surgeon: Clayburn Pert, MD;  Location: [facility];  Service: General;  Laterality: N/A;  . EXCISION MASS ABDOMINAL N/A 02/26/2015   Procedure: EXCISION MASS ABDOMINAL/wash out;  Surgeon: Clayburn Pert, MD;  Location: [facility];  Service: General;  Laterality: N/A;  . FLEXIBLE SIGMOIDOSCOPY  06/19/2015   Procedure: FLEXIBLE SIGMOIDOSCOPY;  Surgeon: Lucilla Lame, MD;  Location: [place];  Service: Endoscopy;;  UNABLE TO ACCESS OSTOMY SITE FOR ACCESS INTO COLON  . HERNIA REPAIR     umbilical  . LAPAROSCOPY  07/12/2015   Procedure: LAPAROSCOPY DIAGNOSTIC;  Surgeon: Clayburn Pert, MD;  Location: [facility];  Service: General;;  . LAPAROTOMY  07/12/2015   Procedure: EXPLORATORY LAPAROTOMY;  Surgeon: Clayburn Pert, MD;  Location: [facility];  Service: General;;  . LAPAROTOMY N/A 02/06/2015   Procedure: Laparotomy, reduction of incarcerated parastomal hernia, repair of parastomal hernia with mesh;  Surgeon: Sherri Rad, MD;  Location: [facility];  Service: General;  Laterality: N/A;  . laparotomy closure of cecal perforation  05/09/2013   Dr. Marina Gravel  . LYSIS OF ADHESION  07/12/2015   Procedure: LYSIS OF ADHESION;  Surgeon: Clayburn Pert, MD;  Location: [facility];  Service: General;;  . TONSILLECTOMY    .  WOUND DEBRIDEMENT N/A 03/03/2015   Procedure: DEBRIDEMENT ABDOMINAL WOUND;  Surgeon: Florene Glen, MD;  Location: [facility];  Service: General;  Laterality: N/A;  . WOUND DEBRIDEMENT N/A 03/09/2015   Procedure: DEBRIDEMENT ABDOMINAL WOUND;  Surgeon: Florene Glen, MD;  Location: [facility];  Service: General;  Laterality: N/A;    Prior to Admission medications   Medication Sig Start Date End Date Taking? Authorizing Provider  Alpha-Lipoic Acid 600 MG CAPS Take 1 capsule (600 mg total) by mouth daily. Patient not taking: Reported on 05/24/2020 05/02/20 07/31/20  Gillis Santa, MD  amiodarone (PACERONE) 200 MG tablet Take 1 tablet (200 mg total) by mouth daily. 02/01/20   Wouk, Ailene Rud, MD  atorvastatin (LIPITOR) 40 MG tablet Take 40 mg by mouth daily.  Patient not taking: Reported on 05/24/2020    [provider]  calcium acetate (PHOSLO) 667 MG capsule Take 1,334 mg by mouth 2 (two) times daily. 01/18/20   [provider]  DULoxetine (CYMBALTA) 30 MG capsule Take 30 mg by mouth daily.  01/18/15   [provider]  HUMALOG KWIKPEN 100 UNIT/ML KwikPen Inject 0.22-0.38 mLs (22-38 Units total) into the skin See admin instructions. Inject 22u under the skin before breakfast and inject 38u under the skin before supper Patient not taking: Reported on 05/24/2020 09/10/19   Jennye Boroughs, MD  insulin glargine (LANTUS) 100 unit/mL SOPN Inject 5 Units into the skin at bedtime as needed (based upon blood glucose reading).  Patient not taking: Reported on 05/24/2020    [provider]  levothyroxine (SYNTHROID, LEVOTHROID) 88 MCG tablet Take 88 mcg by mouth daily before breakfast.  09/11/15   [provider]  midodrine (PROAMATINE) 10 MG tablet Take 10 mg by mouth See admin instructions. Take 1 tablet (10mg ) twice daily before and after dialysis on Monday, Wednesday and Friday 12/02/19   [provider]  ondansetron (ZOFRAN ODT) 4 MG disintegrating tablet Take  1 tablet (4 mg total) by mouth every 8 (eight) hours as needed for nausea or vomiting. Patient not taking: Reported on 05/24/2020 03/21/20   Carrie Mew, MD  oxyCODONE (OXY IR/ROXICODONE) 5 MG immediate release tablet Take 1 tablet (5 mg total) by mouth every 8 (eight) hours as needed for severe pain. Must last 30 days 05/04/20 06/03/20  Gillis Santa, MD  oxyCODONE (OXY IR/ROXICODONE) 5 MG immediate release tablet Take 1 tablet (5 mg total) by mouth every 8 (eight) hours as needed for severe pain. Must last 30 days 06/03/20 07/03/20  Gillis Santa, MD  oxyCODONE (OXY IR/ROXICODONE) 5 MG immediate release tablet Take 1 tablet (5 mg total) by mouth every 8 (eight) hours as needed for severe pain. Must last 30 days 07/03/20 08/02/20  Gillis Santa, MD  polyethylene glycol (MIRALAX / GLYCOLAX) 17 g packet Take 17 g by mouth daily as needed for moderate constipation.    [provider]  torsemide (DEMADEX) 20 MG tablet Take 40 mg by mouth daily.     [provider]  Vitamin D, Ergocalciferol, (DRISDOL) 1.25 MG (50000 UT) CAPS capsule Take 50,000 Units by mouth every Tuesday.    [provider]  warfarin (COUMADIN) 2 MG tablet Take 1 tablet (2 mg total) by mouth daily. Patient taking differently: Take 1.5 mg by mouth daily. 02/01/20 01/31/21  Wouk, Ailene Rud, MD    Allergies Buspirone, Diltiazem hcl, Gabapentin, Hydralazine, Keflex [cephalexin], Lisinopril, Lovastatin, Metformin, Pravastatin, Sitagliptin, and Tramadol  Family History  Problem Relation Age of Onset  . Diabetes Mother   . Hypertension Father   . CAD Father   . Heart attack Father   . Colon cancer Brother     Social History Social History   Tobacco Use  . Smoking status: Former Smoker    Types: Cigarettes    Quit date: 04/18/2013    Years since quitting: 7.1  . Smokeless tobacco: Never Used  Vaping Use  . Vaping Use: Never used  Substance Use Topics  . Alcohol use: No    Alcohol/week: 0.0 standard  drinks  . Drug use: No    Review of Systems Constitutional: No fever/chills Eyes: No visual changes. ENT: No sore throat. Cardiovascular: Denies chest pain. Respiratory: Denies shortness of breath. Gastrointestinal: No abdominal pain.  No nausea, no vomiting.  No diarrhea. Genitourinary: Negative for dysuria. Musculoskeletal: Endorses right hip and lumbar back pain Skin: Negative for rash. Neurological: Negative for headaches, weakness/numbness/paresthesias in any extremity Psychiatric: Negative for suicidal ideation/homicidal ideation  ____________________________________________   PHYSICAL EXAM:  VITAL SIGNS: ED Triage Vitals [05/25/20 0852]  Enc Vitals Group     BP (!) 89/61     Pulse Rate (!) 102     Resp 20     Temp 98.7 F (37.1 C)     Temp Source Oral     SpO2 95 %     Weight 232 lb (105.2 kg)     Height 5\' 3"  (1.6 m)     Head Circumference      Peak Flow      Pain Score 10     Pain Loc      Pain Edu?      Excl. in [place]?    Constitutional: Alert and oriented. Well appearing obese elderly Caucasian female in no acute distress. Eyes: Conjunctivae are normal. PERRL. Head: Atraumatic. Nose: No congestion/rhinnorhea. Mouth/Throat: Mucous membranes are moist. Neck: No stridor Cardiovascular: Grossly normal heart sounds.  Good peripheral circulation. Respiratory: Normal respiratory effort.  No retractions. Gastrointestinal: Soft and nontender. No distention. Musculoskeletal: No obvious deformities.  Tender range of motion at the right hip Neurologic:  Normal speech and language. No gross focal neurologic deficits are appreciated. Skin:  Skin is warm and dry. No rash noted. Psychiatric: Mood and affect are normal. Speech and behavior are normal.  ____________________________________________   LABS (all labs ordered are listed, but only abnormal results are displayed)  Labs Reviewed  BASIC METABOLIC PANEL - Abnormal; Notable for the following components:       Result Value   Sodium 131 (*)    Chloride 94 (*)    Glucose, Bld 114 (*)    BUN 37 (*)    Creatinine, Ser 5.95 (*)    Calcium 8.5 (*)    GFR, Estimated 7 (*)    All other components within normal limits   ____________________________________________ RADIOLOGY  ED MD interpretation: X-ray of the right hip and pelvis shows no evidence of acute bony abnormalities including no fractures or dislocations  Official radiology report(s): DG Hip Unilat With Pelvis 2-3 Views Right  Result Date: 05/25/2020 CLINICAL DATA:  Persistent pain after falling 2 weeks ago. EXAM: DG HIP (WITH OR WITHOUT PELVIS) 2-3V RIGHT COMPARISON:  None. FINDINGS: There is no evidence of hip fracture or dislocation. There is no evidence of arthropathy or other focal bone abnormality. IMPRESSION: Negative. Electronically Signed   By: Jacqulynn Cadet M.D.   On: 05/25/2020 09:31    ____________________________________________   PROCEDURES  Procedure(s) performed (including Critical Care):  Procedures   ____________________________________________   INITIAL IMPRESSION / ASSESSMENT AND PLAN / ED COURSE  As part of my medical decision making, I reviewed the following data within the [place] notes reviewed and incorporated, Labs reviewed, EKG interpreted, Old chart reviewed, Radiograph reviewed and Notes from prior ED visits reviewed and incorporated        70 year old female presents for right hip and back pain Given history, exam and workup I have low suspicion for fracture, dislocation, significant ligamentous injury, septic arthritis, gout flare, new autoimmune arthropathy, or gonococcal arthropathy.  Interventions: X-ray of the right hip and pelvis shows no evidence of acute abnormalities Disposition: Discharge home with strict return precautions and instructions for prompt primary care follow up in the next week.       ____________________________________________   FINAL CLINICAL IMPRESSION(S) / ED DIAGNOSES  Final diagnoses:  Right hip pain     ED Discharge Orders    None  Note:  This document was prepared using Dragon voice recognition software and may include unintentional dictation errors.   Naaman Plummer, MD 05/25/20 [phone]

## 2020-05-25 NOTE — ED Notes (Signed)
Report to Kassie, RN

## 2020-05-25 NOTE — ED Notes (Addendum)
Pt back from xray.  Reports BP chronically low and "I have to take something on dialysis days to keep it up". No recent changes in medications.

## 2020-05-25 NOTE — Progress Notes (Signed)
[facility] ED04 [facility] Hospital Liaison note:  This patient is currently enrolled in [facility] outpatient-based Palliative Care. Will continue to follow for disposition.  Please call with any outpatient palliative questions or concerns.  Thank you, Dee Curry, LPN [facility] Hospital Liaison [phone] 

## 2020-05-25 NOTE — TOC Initial Note (Signed)
Transition of Care [facility]) - Initial/Assessment Note    Patient Details  Name: [name] MRN: [id number] Date of Birth: 06-12-1950  [facility] CM/SW Contact:    Anselm Pancoast, RN Phone Number: 05/25/2020, 12:10 PM  Clinical Narrative:                  Spoke with daughter who states she just got confirmation on transportation to and from dialysis and that was her biggest concern with discharge home. Daughter is coming to pick her up and will bring home O2 but is requesting assistance with getting patient into car at hospital. States she has family meeting her at the house to assist with getting her into the home. Confirmed staff would be available to assist as needed. Daughter with no additional needs or concerns.         Patient Goals and CMS Choice        Expected Discharge Plan and Services                                                Prior Living Arrangements/Services                       Activities of Daily Living      Permission Sought/Granted                  Emotional Assessment              Admission diagnosis:  weakness+fall EMS Patient Active Problem List   Diagnosis Date Noted  . Acute hypoxemic respiratory failure ([place]) 01/23/2020  . Complication from renal dialysis device 10/12/2019  . Acute on chronic diastolic CHF (congestive heart failure) ([place]) 09/07/2019  . Pharmacologic therapy 06/30/2019  . Chronic respiratory failure with hypoxia ([place]) 06/22/2019  . Hypoxia 06/10/2019  . Spondylosis without myelopathy or radiculopathy, lumbosacral region 11/05/2018  . DDD (degenerative disc disease), lumbosacral 11/05/2018  . Hematochezia 09/03/2018  . Hemorrhoids, internal 09/03/2018  . Proctitis 09/03/2018  . OSA (obstructive sleep apnea) 08/24/2018  . Acute respiratory failure with hypoxia ([place]) 07/12/2018  . DM type 2 with diabetic peripheral neuropathy ([place]) 11/25/2017  . CKD (chronic kidney disease)  stage 5, GFR less than 15 ml/min (HCC) 10/05/2017  . 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 Acute kidney injury superimposed on chronic kidney disease ([place]) 09/01/2017  . Chronic systolic CHF (congestive heart failure) ([place]) 08/19/2017  . Paroxysmal A-fib ([place]) 08/14/2017  . Cervical myelopathy ([place]) 08/07/2017  . Onychomycosis of multiple toenails with type 2 diabetes mellitus and peripheral neuropathy ([employer]) 06/23/2017  . Myofascial pain 05/27/2017  . Chronic pain of left upper extremity 05/27/2017  . Morbid obesity ([place]) 05/06/2017  . Uncontrolled type 2 diabetes mellitus with hyperglycemia, with long-term current use of insulin ([place]) 02/07/2017  . Hyperlipidemia, mixed 08/08/2016  . Hx of adenomatous colonic polyps 06/14/2016  . 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 Chronic pain syndrome 03/11/2016  . Obesity, Class II, BMI 35-39.9 03/11/2016  . Osteopenia of multiple sites 01/26/2016  . Heart burn 12/18/2015  . Polyneuropathy 11/16/2015  . Lumbar spondylosis 09/25/2015  . Lumbar facet syndrome (Bilateral) (L>R) 09/25/2015  . Recurrent major depressive disorder, in full remission ([place]) 09/19/2015  . Diabetic neuropathy ([place]) 09/06/2015  . H/O colostomy 07/12/2015  . Long  term current use of opiate analgesic 07/03/2015  . Long term prescription opiate use 07/03/2015  . Opiate use (22.5 MME/Day) 07/03/2015  . Encounter for therapeutic drug level monitoring 07/03/2015  . Encounter for pain management planning 07/03/2015  . Neurogenic pain 07/03/2015  . Chronic musculoskeletal pain 07/03/2015  . Visceral abdominal pain 07/03/2015  . Diabetic peripheral neuropathy ([place]) 07/03/2015  . Chronic low back pain (Primary Area of Pain) (Bilateral) (L>R) 07/03/2015  . Chronic lower extremity pain (Secondary area of Pain) (Bilateral) (R>L) 07/03/2015  . Chronic hip pain (Bilateral) (L>R) 07/03/2015  . Chronic neck pain (Bilateral) (L>R) 07/03/2015   . Anemia 02/16/2015  . Arthritis 02/16/2015  . End stage renal disease ([place]) 02/16/2015  . Focal and segmental hyalinosis 02/16/2015  . Bleeding gastrointestinal 02/16/2015  . Acquired atrophy of thyroid 02/16/2015  . Psoriasis 02/16/2015  . Small bowel obstruction ([place]) 02/04/2015  . Severe aortic stenosis 10/11/2014  . Paroxysmal supraventricular tachycardia ([place]) 10/11/2014  . SOBOE (shortness of breath on exertion) 10/11/2014  . Benign essential HTN 10/04/2014  . Obesity 09/05/2014  . B12 deficiency 03/02/2014  . Diabetes mellitus type 2 in obese ([place]) 12/01/2013  . Abnormal presence of protein in urine 10/15/2010   PCP:  Adin Hector, MD Pharmacy:   [id number] - [place], [place] [address] Phone: [phone] Fax: [phone]     Social Determinants of Health (SDOH) Interventions    Readmission Risk Interventions Readmission Risk Prevention Plan 01/28/2020 09/09/2019 06/14/2019  Transportation Screening Complete Complete Complete  PCP or Specialist Appt within 5-7 Days - - -  PCP or Specialist Appt within 3-5 Days - Complete Complete  Home Care Screening - - -  Medication Review (RN CM) - - -  [place] or [place] - Complete Complete  Social Work Consult for [place] Planning/Counseling - Complete Complete  Palliative Care Screening - Not Applicable Not Applicable  Medication Review Press photographer) (No Data) Complete Complete  PCP or Specialist appointment within 3-5 days of discharge Complete - -  [place] or Home Care Consult Complete - -  SW Recovery Care/Counseling Consult Complete - -  Palliative Care Screening Not Applicable - -  [place] Not Applicable - -  Some recent data might be hidden

## 2020-05-25 NOTE — Progress Notes (Signed)
[facility] Palliative Care Consult Note Telephone: [phone]  Fax: [phone]      Due to the COVID-19 crisis, this visit was done via telemedicine from my office and it was initiated and consent by this patient and or family.  I connected with proxy of   VANDY FONG on 05/25/20 by audio telemedicine application and verified that I am speaking with the correct person using two identifiers.   I discussed the limitations of evaluation and management by telemedicine. The patient/proxy  expressed understanding and agreed to proceed.  Date of encounter: 05/25/20 PATIENT NAME: [name] [address] [place] [address]   [phone] (home)  DOB: 1950-12-26 MRN: [id number] PRIMARY CARE PROVIDER:    Adin Hector, MD,  [address] [place] [address] [phone]  REFERRING PROVIDER:   Adin Hector, MD [facility] [place],  [place] [address] [phone]  RESPONSIBLE PARTY:    Contact Information    Name Relation Home Work [place] D Daughter [phone]  [phone]   Wonda Amis [phone]  [phone]   Willene Hatchet Sister [phone]  [phone]      Palliative Care was asked to follow this patient by consultation request of  Adin Hector, MD to address advance care planning and complex medical decision making. This is the follow up visit.   ASSESSMENT AND RECOMMENDATIONS:   1. Advance Care Planning/Goals of Care: Goals include to maximize quality of life and symptom management. Our advance care planning conversation included a discussion about:     The value and importance of advance care planning   Experiences with loved ones who have been seriously ill or have died   Exploration of personal, cultural or spiritual beliefs that might influence medical decisions   Exploration of goals of care in the event of a  sudden injury or illness.  Discussion today centered around continued complex medical management. Patient went to emergency room this morning for assessment of hip pain,  as planned. She did not manifest any fracture or acute injury on radiograph. Meanwhile over the course of the day while she was in the emergency room the dialysis transportation service worked out and [employer] has been able to get her transportation to dialysis starting tomorrow. Museum/gallery conservator , daughter, was very happy this could be worked out as she is not yet ready to make the  decision to stop dialysis. We discussed that her mother's function was poor and that she might not be able to endure dialysis for much longer, but she wanted the opportunity to try.  Pt  will be proceeding to dialysis tomorrow morning by Lucianne Lei. Meanwhile I was able to reach out to Dr. Lew Dawes of the pain clinic. He stated it would be agreeable with him for palliative to take over her chronic pain management given her palliative goals and current extensive illness. I will assume prescription for her pain medication which I will prescribe today and on our next visit we will complete the pain contract. Family will be sent a copy in order to review in advance.   Amber gave verbal consent to complying with any future contract and states that her mother had entered the contract with the pain clinic at a point in her life when she was mobile, able to drive and not cognitively impaired. PMP aware score is reasonable given her being followed  by pain clinic. I also am adding acetaminophen CR 650 mg to be given daily with oxycodone 5 mg every eight hours, as was her previous dosing. There is no plan to escalate dose at this time.  #30 sent to [facility] to cover increased use during injury.  We will follow up with home visit in the next 2-3 weeks For assessment and creation of contract, complex medical management and advance planning.  Follow up Palliative Care Visit:  Palliative care will continue to follow for advance care planning and  clarification and symptom management. Return 2-3 weeks or prn.  I spent  25 providing this consultation. More than 50% of the time in this consultation was spent in counseling and care coordination.  CODE STATUS: TBD, likely dnr per daughter but no documents completed  PPS: 30%  HOSPICE ELIGIBILITY/DIAGNOSIS: TBD  ALLERGIES:  Allergies  Allergen Reactions  . Buspirone Other (See Comments)    Weakness  . Diltiazem Hcl Palpitations  . Gabapentin Palpitations  . Hydralazine Rash  . Keflex [Cephalexin] Rash  . Lisinopril Cough  . Lovastatin Palpitations  . Metformin Diarrhea  . Pravastatin Other (See Comments)    Insomnia  . Sitagliptin Other (See Comments)    Constipation  . Tramadol Itching     PERTINENT MEDICATIONS:  Outpatient Encounter Medications as of 05/25/2020  Medication Sig  . Alpha-Lipoic Acid 600 MG CAPS Take 1 capsule (600 mg total) by mouth daily. (Patient not taking: Reported on 05/24/2020)  . amiodarone (PACERONE) 200 MG tablet Take 1 tablet (200 mg total) by mouth daily.  Marland Kitchen atorvastatin (LIPITOR) 40 MG tablet Take 40 mg by mouth daily.  (Patient not taking: Reported on 05/24/2020)  . calcium acetate (PHOSLO) 667 MG capsule Take 1,334 mg by mouth 2 (two) times daily.  . DULoxetine (CYMBALTA) 30 MG capsule Take 30 mg by mouth daily.   Marland Kitchen HUMALOG KWIKPEN 100 UNIT/ML KwikPen Inject 0.22-0.38 mLs (22-38 Units total) into the skin See admin instructions. Inject 22u under the skin before breakfast and inject 38u under the skin before supper (Patient not taking: Reported on 05/24/2020)  . insulin glargine (LANTUS) 100 unit/mL SOPN Inject 5 Units into the skin at bedtime as needed (based upon blood glucose reading).  (Patient not taking: Reported on 05/24/2020)  . levothyroxine (SYNTHROID, LEVOTHROID) 88 MCG tablet Take 88 mcg by mouth daily before breakfast.   . midodrine (PROAMATINE) 10 MG tablet Take 10 mg by  mouth See admin instructions. Take 1 tablet (10mg ) twice daily before and after dialysis on Monday, Wednesday and Friday  . ondansetron (ZOFRAN ODT) 4 MG disintegrating tablet Take 1 tablet (4 mg total) by mouth every 8 (eight) hours as needed for nausea or vomiting. (Patient not taking: Reported on 05/24/2020)  . oxyCODONE (OXY IR/ROXICODONE) 5 MG immediate release tablet Take 1 tablet (5 mg total) by mouth every 8 (eight) hours as needed for severe pain. Must last 30 days  . [START ON 06/03/2020] oxyCODONE (OXY IR/ROXICODONE) 5 MG immediate release tablet Take 1 tablet (5 mg total) by mouth every 8 (eight) hours as needed for severe pain. Must last 30 days  . [START ON 07/03/2020] oxyCODONE (OXY IR/ROXICODONE) 5 MG immediate release tablet Take 1 tablet (5 mg total) by mouth every 8 (eight) hours as needed for severe pain. Must last 30 days  . polyethylene glycol (MIRALAX / GLYCOLAX) 17 g packet Take 17 g by mouth daily as needed for moderate constipation.  . torsemide (DEMADEX) 20 MG tablet Take 40  mg by mouth daily.   . Vitamin D, Ergocalciferol, (DRISDOL) 1.25 MG (50000 UT) CAPS capsule Take 50,000 Units by mouth every Tuesday.  . warfarin (COUMADIN) 2 MG tablet Take 1 tablet (2 mg total) by mouth daily. (Patient taking differently: Take 1.5 mg by mouth daily.)   No facility-administered encounter medications on file as of 05/25/2020.   Thank you for the opportunity to participate in the care of Ms. Oesterling.  The palliative care team will continue to follow. Please call our office at [phone] if we can be of additional assistance.   Jason Coop, NP , DNP, MPH, AGPCNP-BC, [name]

## 2020-05-30 ENCOUNTER — Other Ambulatory Visit: Payer: Medicare Other | Admitting: Primary Care

## 2020-06-14 ENCOUNTER — Other Ambulatory Visit: Payer: Medicare Other | Admitting: Primary Care

## 2020-06-14 ENCOUNTER — Other Ambulatory Visit: Payer: Self-pay

## 2020-06-14 DIAGNOSIS — N185 Chronic kidney disease, stage 5: Secondary | ICD-10-CM

## 2020-06-14 DIAGNOSIS — Z515 Encounter for palliative care: Secondary | ICD-10-CM

## 2020-06-14 NOTE — Progress Notes (Signed)
[facility] Consult Note Telephone: [phone]  Fax: [phone]    Date of encounter: 06/14/20 PATIENT NAME: [name] [address] [place] [address]   [phone] (home)  DOB: [birth year] MRN: [id number] PRIMARY CARE PROVIDER:    Adin Hector, MD,  [place] [address] [phone]  REFERRING PROVIDER:   Adin Hector, MD [facility] [place],  [place] [address] [phone]  RESPONSIBLE PARTY:    Contact Information    Name Relation Home Work [place] D Daughter [phone]  [phone]   Wonda Amis [phone]  [phone]   Willene Hatchet Sister [phone]  [phone]        I met face to face with patient and family in  home. Palliative Care was asked to follow this patient by consultation request of  Adin Hector, MD to address advance care planning and complex medical decision making. This is the follow up  visit.                                     ASSESSMENT AND PLAN / RECOMMENDATIONS:   Advance Care Planning/Goals of Care: Goals include to maximize quality of life and symptom management. Our advance care planning conversation included a discussion about:     The value and importance of advance care planning   Experiences with loved ones who have been seriously ill or have died   Exploration of personal, cultural or spiritual beliefs that might influence medical decisions   Exploration of goals of care in the event of a sudden injury or illness   Review  of an  advance directive document .  Decision  to de-escalate disease focused treatments  And enroll in hospice due to poor prognosis.  CODE STATUS: DNR  Family elects to stop dialysis due to not being able to tolerate, and elect hospice services. Hospice referral made per family request. Needs DNR form on hospice  admission.  Symptom Management/Plan:  Dysphagia; Not able to eat/drink and trouble with pills. Advised to hold pills for now as they would like to address comfort measures only.  Not giving insulin at this time.  Pain: Increasing, needs EOL pain management. Has oxycodone which she has been on chronically.  Dyspnea: Increasing, needs EOL dyspnea management. Has oxygen at 5 l / [place]. Education RE fluid overload and to decrease PO fluids to those for comfort (eg do not push)  Follow up Palliative Care Visit: Refer to hospice.  I spent 60 minutes providing this consultation. More than 50% of the time in this consultation was spent in counseling and care coordination.  PPS: 30%  HOSPICE ELIGIBILITY/DIAGNOSIS: yes, ESRD no dialysis  Chief Complaint: dyspnea, EOL decision to d/c HD  HISTORY OF PRESENT ILLNESS:  [name] is a 70 y.o. year old female  with ESRD, COPD, CHF. Has declined to non ambulatory, asking to stop HD, asking to pass. Family needs supportive services of hospice for EOL. Pt has continuous chronic back pain, not well relieved with current regimen. She has had frequent falls as well, and increased confusion. She is now anuric where she was making some urine a few weeks ago .   History obtained from review of EMR, discussion with primary team, and  interview with family, caregiver  and/or Ms. Mera.  Records reviewed and summarized above.   ROS  General: dyspnea ENMT: denies dysphagia Cardiovascular: denies chest pain, endorses DOE Pulmonary: denies cough, endorses  increased SOB Abdomen: endorses poor appetite, denies constipation, endorses incontinence of bowel GU: anuric MSK:  Endorses  weakness,  ++ falls reported Skin: denies rashes or wounds Neurological: endorses back  pain, denies insomnia Psych: Endorses agitated  Mood, endorses seeing deceased relatives. Heme/lymph/immuno: denies bruises, abnormal bleeding  Physical Exam: Current and past weights:  unavailable Constitutional: NAD  126/77 HR 76 RR 24  General: frail appearing, obese EYES: anicteric sclera, lids intact, no discharge  ENMT: intact hearing, oral mucous membranes moist, dentition intact CV:  RRR,  2+ bil LE edema Pulmonary: diminished, fine rales in bases, slight  increased work of breathing, no cough, 5 L oxygen, Pulse ox 70% Abdomen: intake sips/bites,  no ascites MSK: mild  sarcopenia, moves all extremities,  Non ambulatory, frequent falls Skin: warm and dry, no rashes or wounds on visible skin Neuro:  ++ generalized weakness, ++ cognitive impairment Psych: non-anxious affect, A and O x1 Hem/lymph/immuno: no widespread bruising   CURRENT PROBLEM LIST:  Patient Active Problem List   Diagnosis Date Noted  . Acute pain due to trauma 05/25/2020  . Acute hypoxemic respiratory failure ([place]) 01/23/2020  . Complication from renal dialysis device 10/12/2019  . Acute on chronic diastolic CHF (congestive heart failure) ([place]) 09/07/2019  . Pharmacologic therapy 06/30/2019  . Chronic respiratory failure with hypoxia ([place]) 06/22/2019  . Hypoxia 06/10/2019  . Spondylosis without myelopathy or radiculopathy, lumbosacral region 11/05/2018  . DDD (degenerative disc disease), lumbosacral 11/05/2018  . Hematochezia 09/03/2018  . Hemorrhoids, internal 09/03/2018  . Proctitis 09/03/2018  . OSA (obstructive sleep apnea) 08/24/2018  . Acute respiratory failure with hypoxia ([place]) 07/12/2018  . DM type 2 with diabetic peripheral neuropathy ([place]) 11/25/2017  . CKD (chronic kidney disease) stage 5, GFR less than 15 ml/min (HCC) 10/05/2017  . 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 Acute kidney injury superimposed on chronic kidney disease ([place]) 09/01/2017  . Chronic systolic CHF (congestive heart failure) ([place]) 08/19/2017  . Paroxysmal A-fib ([place]) 08/14/2017  . Cervical myelopathy ([place]) 08/07/2017  . Onychomycosis of multiple toenails with type 2 diabetes  mellitus and peripheral neuropathy ([place]) 06/23/2017  . Myofascial pain 05/27/2017  . Chronic pain of left upper extremity 05/27/2017  . Morbid obesity ([place]) 05/06/2017  . Uncontrolled type 2 diabetes mellitus with hyperglycemia, with long-term current use of insulin ([place]) 02/07/2017  . Hyperlipidemia, mixed 08/08/2016  . Hx of adenomatous colonic polyps 06/14/2016  . 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 Chronic pain syndrome 03/11/2016  . Obesity, Class II, BMI 35-39.9 03/11/2016  . Osteopenia of multiple sites 01/26/2016  . Heart burn 12/18/2015  . Polyneuropathy 11/16/2015  . Lumbar spondylosis 09/25/2015  . Lumbar facet syndrome (Bilateral) (L>R) 09/25/2015  . Recurrent major depressive disorder, in full remission ([place]) 09/19/2015  . Diabetic neuropathy ([place]) 09/06/2015  . H/O colostomy 07/12/2015  . Long term current use of opiate analgesic 07/03/2015  . Long term prescription opiate use 07/03/2015  . Opiate use (22.5 MME/Day) 07/03/2015  . Encounter for therapeutic drug level monitoring 07/03/2015  . Encounter for pain management planning 07/03/2015  . Neurogenic pain 07/03/2015  . Chronic musculoskeletal pain 07/03/2015  . Visceral abdominal pain 07/03/2015  . Diabetic peripheral neuropathy ([place]) 07/03/2015  . Chronic low back pain (Primary Area of Pain) (Bilateral) (L>R) 07/03/2015  . Chronic lower extremity pain (Secondary area  of Pain) (Bilateral) (R>L) 07/03/2015  . Chronic hip pain (Bilateral) (L>R) 07/03/2015  . Chronic neck pain (Bilateral) (L>R) 07/03/2015  . Anemia 02/16/2015  . Arthritis 02/16/2015  . End stage renal disease ([place]) 02/16/2015  . Focal and segmental hyalinosis 02/16/2015  . Bleeding gastrointestinal 02/16/2015  . Acquired atrophy of thyroid 02/16/2015  . Psoriasis 02/16/2015  . Small bowel obstruction ([place]) 02/04/2015  . Severe aortic stenosis 10/11/2014  . Paroxysmal supraventricular tachycardia ([place]) 10/11/2014  . SOBOE  (shortness of breath on exertion) 10/11/2014  . Benign essential HTN 10/04/2014  . Obesity 09/05/2014  . B12 deficiency 03/02/2014  . Diabetes mellitus type 2 in obese ([place]) 12/01/2013  . Abnormal presence of protein in urine 10/15/2010   PAST MEDICAL HISTORY:  Active Ambulatory Problems    Diagnosis Date Noted  . Obesity 09/05/2014  . Small bowel obstruction ([place]) 02/04/2015  . Anemia 02/16/2015  . Severe aortic stenosis 10/11/2014  . Arthritis 02/16/2015  . B12 deficiency 03/02/2014  . Benign essential HTN 10/04/2014  . End stage renal disease ([place]) 02/16/2015  . Focal and segmental hyalinosis 02/16/2015  . Bleeding gastrointestinal 02/16/2015  . Acquired atrophy of thyroid 02/16/2015  . Abnormal presence of protein in urine 10/15/2010  . Psoriasis 02/16/2015  . Paroxysmal supraventricular tachycardia ([place]) 10/11/2014  . SOBOE (shortness of breath on exertion) 10/11/2014  . Diabetes mellitus type 2 in obese ([place]) 12/01/2013  . Long term current use of opiate analgesic 07/03/2015  . Long term prescription opiate use 07/03/2015  . Opiate use (22.5 MME/Day) 07/03/2015  . Encounter for therapeutic drug level monitoring 07/03/2015  . Encounter for pain management planning 07/03/2015  . Neurogenic pain 07/03/2015  . Chronic musculoskeletal pain 07/03/2015  . Visceral abdominal pain 07/03/2015  . Diabetic peripheral neuropathy ([place]) 07/03/2015  . Chronic low back pain (Primary Area of Pain) (Bilateral) (L>R) 07/03/2015  . Chronic lower extremity pain (Secondary area of Pain) (Bilateral) (R>L) 07/03/2015  . Chronic hip pain (Bilateral) (L>R) 07/03/2015  . Chronic neck pain (Bilateral) (L>R) 07/03/2015  . H/O colostomy 07/12/2015  . Diabetic neuropathy ([place]) 09/06/2015  . Recurrent major depressive disorder, in full remission ([place]) 09/19/2015  . Lumbar spondylosis 09/25/2015  . Lumbar facet syndrome (Bilateral) (L>R) 09/25/2015  . Polyneuropathy 11/16/2015  . Heart burn  12/18/2015  . Osteopenia of multiple sites 01/26/2016  . 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 Chronic pain syndrome 03/11/2016  . Obesity, Class II, BMI 35-39.9 03/11/2016  . Hx of adenomatous colonic polyps 06/14/2016  . Hyperlipidemia, mixed 08/08/2016  . Uncontrolled type 2 diabetes mellitus with hyperglycemia, with long-term current use of insulin ([place]) 02/07/2017  . Morbid obesity ([place]) 05/06/2017  . Myofascial pain 05/27/2017  . Chronic pain of left upper extremity 05/27/2017  . Acute kidney injury superimposed on chronic kidney disease ([place]) 09/01/2017  . Cervical myelopathy ([place]) 08/07/2017  . Chronic systolic CHF (congestive heart failure) ([place]) 08/19/2017  . Onychomycosis of multiple toenails with type 2 diabetes mellitus and peripheral neuropathy ([place]) 06/23/2017  . Paroxysmal A-fib ([place]) 08/14/2017  . 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 DM type 2 with diabetic peripheral neuropathy ([place]) 11/25/2017  . Acute respiratory failure with hypoxia ([place]) 07/12/2018  . Hematochezia 09/03/2018  . Hemorrhoids, internal 09/03/2018  . Proctitis 09/03/2018  . Spondylosis without myelopathy or radiculopathy, lumbosacral region 11/05/2018  . DDD (degenerative disc disease), lumbosacral 11/05/2018  . CKD (chronic kidney disease) stage 5, GFR less than 15 ml/min (HCC) 10/05/2017  . Hypoxia  06/10/2019  . Chronic respiratory failure with hypoxia ([place]) 06/22/2019  . OSA (obstructive sleep apnea) 08/24/2018  . Pharmacologic therapy 06/30/2019  . Acute on chronic diastolic CHF (congestive heart failure) ([place]) 09/07/2019  . Complication from renal dialysis device 10/12/2019  . Acute hypoxemic respiratory failure ([place]) 01/23/2020  . Acute pain due to trauma 05/25/2020   Resolved Ambulatory Problems    Diagnosis Date Noted  . Diverticulitis of colon 09/05/2014  . Incarcerated hernia   . Diverticulitis 02/16/2015  . Open wnd anterior abdomen  10/15/2013  . Wound, surgical, infected 02/21/2015  . Abdominal wall abscess at site of surgical wound   . Colostomy, retracted ([place]) 05/10/2015  . Preop examination   . Chronic abdominal pain (Third area of Pain) (Bilateral) (L>R) 07/03/2015  . Chronic knee pain (Bilateral) (L>R) 07/03/2015   Past Medical History:  Diagnosis Date  . Anxiety   . Aortic valve stenosis   . Arrhythmia   . Bowel obstruction ([place])   . CHF (congestive heart failure) ([place])   . CKD (chronic kidney disease)   . Colostomy in place [facility])   . Diabetes mellitus without complication ([place])   . Dialysis patient ([place])   . Diverticulitis large intestine   . Dysrhythmia   . Ectopic atrial tachycardia ([place])   . Gastritis   . GI bleed   . Heart murmur   . Hyperlipidemia   . Hypertension   . Hypothyroidism   . Hypothyroidism   . MRSA (methicillin resistant Staphylococcus aureus)   . Neuropathy   . Neuropathy in diabetes ([place])   . Pedal edema   . Shortness of breath dyspnea   . Sleep apnea   . Vertigo    SOCIAL HX:  Social History   Tobacco Use  . Smoking status: Former Smoker    Types: Cigarettes    Quit date: 04/18/2013    Years since quitting: 7.1  . Smokeless tobacco: Never Used  Substance Use Topics  . Alcohol use: No    Alcohol/week: 0.0 standard drinks   FAMILY HX:  Family History  Problem Relation Age of Onset  . Diabetes Mother   . Hypertension Father   . CAD Father   . Heart attack Father   . Colon cancer Brother       ALLERGIES:  Allergies  Allergen Reactions  . Buspirone Other (See Comments)    Weakness  . Diltiazem Hcl Palpitations  . Gabapentin Palpitations  . Hydralazine Rash  . Keflex [Cephalexin] Rash  . Lisinopril Cough  . Lovastatin Palpitations  . Metformin Diarrhea  . Pravastatin Other (See Comments)    Insomnia  . Sitagliptin Other (See Comments)    Constipation  . Tramadol Itching     PERTINENT MEDICATIONS:  Outpatient Encounter Medications as of 06/14/2020   Medication Sig  . Alpha-Lipoic Acid 600 MG CAPS Take 1 capsule (600 mg total) by mouth daily. (Patient not taking: Reported on 05/24/2020)  . amiodarone (PACERONE) 200 MG tablet Take 1 tablet (200 mg total) by mouth daily.  Marland Kitchen atorvastatin (LIPITOR) 40 MG tablet Take 40 mg by mouth daily.  (Patient not taking: Reported on 05/24/2020)  . calcium acetate (PHOSLO) 667 MG capsule Take 1,334 mg by mouth 2 (two) times daily.  . DULoxetine (CYMBALTA) 30 MG capsule Take 30 mg by mouth daily.   Marland Kitchen HUMALOG KWIKPEN 100 UNIT/ML KwikPen Inject 0.22-0.38 mLs (22-38 Units total) into the skin See admin instructions. Inject 22u under the skin before breakfast and inject 38u under the  skin before supper (Patient not taking: Reported on 05/24/2020)  . insulin glargine (LANTUS) 100 unit/mL SOPN Inject 5 Units into the skin at bedtime as needed (based upon blood glucose reading).  (Patient not taking: Reported on 05/24/2020)  . levothyroxine (SYNTHROID, LEVOTHROID) 88 MCG tablet Take 88 mcg by mouth daily before breakfast.   . midodrine (PROAMATINE) 10 MG tablet Take 10 mg by mouth See admin instructions. Take 1 tablet (38m) twice daily before and after dialysis on Monday, Wednesday and Friday  . ondansetron (ZOFRAN ODT) 4 MG disintegrating tablet Take 1 tablet (4 mg total) by mouth every 8 (eight) hours as needed for nausea or vomiting. (Patient not taking: Reported on 05/24/2020)  . oxyCODONE (OXY IR/ROXICODONE) 5 MG immediate release tablet Take 1 tablet (5 mg total) by mouth every 8 (eight) hours as needed for severe pain. Must last 30 days  . oxyCODONE (OXY IR/ROXICODONE) 5 MG immediate release tablet Take 1 tablet (5 mg total) by mouth every 8 (eight) hours as needed for severe pain. Must last 30 days  . [START ON 07/03/2020] oxyCODONE (OXY IR/ROXICODONE) 5 MG immediate release tablet Take 1 tablet (5 mg total) by mouth every 8 (eight) hours as needed for severe pain. Must last 30 days  . polyethylene glycol (MIRALAX /  GLYCOLAX) 17 g packet Take 17 g by mouth daily as needed for moderate constipation.  . torsemide (DEMADEX) 20 MG tablet Take 40 mg by mouth daily.   . Vitamin D, Ergocalciferol, (DRISDOL) 1.25 MG (50000 UT) CAPS capsule Take 50,000 Units by mouth every Tuesday.  . warfarin (COUMADIN) 2 MG tablet Take 1 tablet (2 mg total) by mouth daily. (Patient taking differently: Take 1.5 mg by mouth daily.)   No facility-administered encounter medications on file as of 06/14/2020.    Thank you for the opportunity to participate in the care of Ms. Martha.  The palliative care team will continue to follow. Please call our office at [phone] we can be of additional assistance.   KJason Coop NP , DNP, MPH, AGPCNP-BC, ACHPN  COVID-19 PATIENT SCREENING TOOL Asked and negative response unless otherwise noted:   Have you had symptoms of covid, tested positive or been in contact with someone with symptoms/positive test in the past 5-10 days?

## 2020-06-15 ENCOUNTER — Telehealth: Payer: Self-pay | Admitting: Primary Care

## 2020-06-18 NOTE — Telephone Encounter (Signed)
Call from daughter, patient passed away this morning. Hospice is in attendance.

## 2020-06-18 DEATH — deceased

## 2020-07-27 ENCOUNTER — Encounter: Payer: Medicare Other | Admitting: Student in an Organized Health Care Education/Training Program
